# Patient Record
Sex: Female | Born: 1960 | Race: Black or African American | Hispanic: No | Marital: Single | Smoking: Never smoker
Health system: Southern US, Community
[De-identification: ages and names within clinical notes are randomized; demographics above are authoritative.]

## PROBLEM LIST (undated history)

## (undated) DIAGNOSIS — M1990 Unspecified osteoarthritis, unspecified site: Secondary | ICD-10-CM

## (undated) DIAGNOSIS — G8929 Other chronic pain: Secondary | ICD-10-CM

## (undated) DIAGNOSIS — M549 Dorsalgia, unspecified: Secondary | ICD-10-CM

## (undated) DIAGNOSIS — E669 Obesity, unspecified: Secondary | ICD-10-CM

## (undated) DIAGNOSIS — E7800 Pure hypercholesterolemia, unspecified: Secondary | ICD-10-CM

## (undated) DIAGNOSIS — R600 Localized edema: Secondary | ICD-10-CM

## (undated) DIAGNOSIS — G35 Multiple sclerosis: Secondary | ICD-10-CM

## (undated) DIAGNOSIS — E559 Vitamin D deficiency, unspecified: Secondary | ICD-10-CM

## (undated) DIAGNOSIS — I10 Essential (primary) hypertension: Secondary | ICD-10-CM

## (undated) DIAGNOSIS — M2550 Pain in unspecified joint: Secondary | ICD-10-CM

## (undated) DIAGNOSIS — G709 Myoneural disorder, unspecified: Secondary | ICD-10-CM

## (undated) HISTORY — PX: OTHER SURGICAL HISTORY: SHX169

## (undated) HISTORY — PX: TONSILLECTOMY: SUR1361

## (undated) HISTORY — PX: APPENDECTOMY: SHX54

## (undated) HISTORY — DX: Essential (primary) hypertension: I10

## (undated) HISTORY — DX: Pure hypercholesterolemia, unspecified: E78.00

## (undated) HISTORY — DX: Dorsalgia, unspecified: M54.9

## (undated) HISTORY — DX: Localized edema: R60.0

## (undated) HISTORY — DX: Vitamin D deficiency, unspecified: E55.9

## (undated) HISTORY — DX: Other chronic pain: G89.29

## (undated) HISTORY — DX: Pain in unspecified joint: M25.50

## (undated) HISTORY — PX: ROTATOR CUFF REPAIR: SHX139

## (undated) HISTORY — DX: Obesity, unspecified: E66.9

## (undated) HISTORY — PX: HAND SURGERY: SHX662

---

## 2009-10-04 ENCOUNTER — Other Ambulatory Visit: Admission: RE | Admit: 2009-10-04 | Discharge: 2009-10-04 | Payer: Self-pay | Admitting: Obstetrics and Gynecology

## 2013-04-06 DIAGNOSIS — G35 Multiple sclerosis: Secondary | ICD-10-CM | POA: Insufficient documentation

## 2013-12-17 ENCOUNTER — Emergency Department (HOSPITAL_COMMUNITY)
Admission: EM | Admit: 2013-12-17 | Discharge: 2013-12-17 | Disposition: A | Discharge status: Home / Self Care | Payer: BC Managed Care – PPO | Attending: Emergency Medicine | Admitting: Emergency Medicine

## 2013-12-17 ENCOUNTER — Emergency Department (HOSPITAL_COMMUNITY): Payer: BC Managed Care – PPO

## 2013-12-17 ENCOUNTER — Encounter (HOSPITAL_COMMUNITY): Payer: Self-pay | Admitting: Emergency Medicine

## 2013-12-17 DIAGNOSIS — Z8669 Personal history of other diseases of the nervous system and sense organs: Secondary | ICD-10-CM | POA: Insufficient documentation

## 2013-12-17 DIAGNOSIS — Z79899 Other long term (current) drug therapy: Secondary | ICD-10-CM | POA: Insufficient documentation

## 2013-12-17 DIAGNOSIS — M79609 Pain in unspecified limb: Secondary | ICD-10-CM | POA: Insufficient documentation

## 2013-12-17 DIAGNOSIS — R002 Palpitations: Secondary | ICD-10-CM | POA: Insufficient documentation

## 2013-12-17 DIAGNOSIS — Z87891 Personal history of nicotine dependence: Secondary | ICD-10-CM | POA: Insufficient documentation

## 2013-12-17 DIAGNOSIS — R110 Nausea: Secondary | ICD-10-CM | POA: Insufficient documentation

## 2013-12-17 DIAGNOSIS — R079 Chest pain, unspecified: Secondary | ICD-10-CM | POA: Insufficient documentation

## 2013-12-17 HISTORY — DX: Multiple sclerosis: G35

## 2013-12-17 LAB — POCT I-STAT TROPONIN I: Troponin i, poc: 0 ng/mL (ref 0.00–0.08)

## 2013-12-17 LAB — BASIC METABOLIC PANEL
Calcium: 8.9 mg/dL (ref 8.4–10.5)
Chloride: 107 mEq/L (ref 96–112)
GFR calc Af Amer: >90 mL/min (ref >90)
GFR calc non Af Amer: >90 mL/min (ref >90)
Sodium: 140 mEq/L (ref 135–145)

## 2013-12-17 LAB — CBC
MCH: 29.6 pg (ref 26.0–34.0)
MCHC: 33.8 g/dL (ref 30.0–36.0)
Platelets: 194 10*3/uL (ref 150–400)
RBC: 4.73 MIL/uL (ref 3.87–5.11)
RDW: 13.2 % (ref 11.5–15.5)
WBC: 4.7 10*3/uL (ref 4.0–10.5)

## 2013-12-17 IMAGING — CR DG CHEST 2V
2 series · 2 of 2 positions shown · non-contrast
Comparison: None.

CLINICAL DATA: Chest pain

EXAM:
CHEST  2 VIEW

[w chest pa]
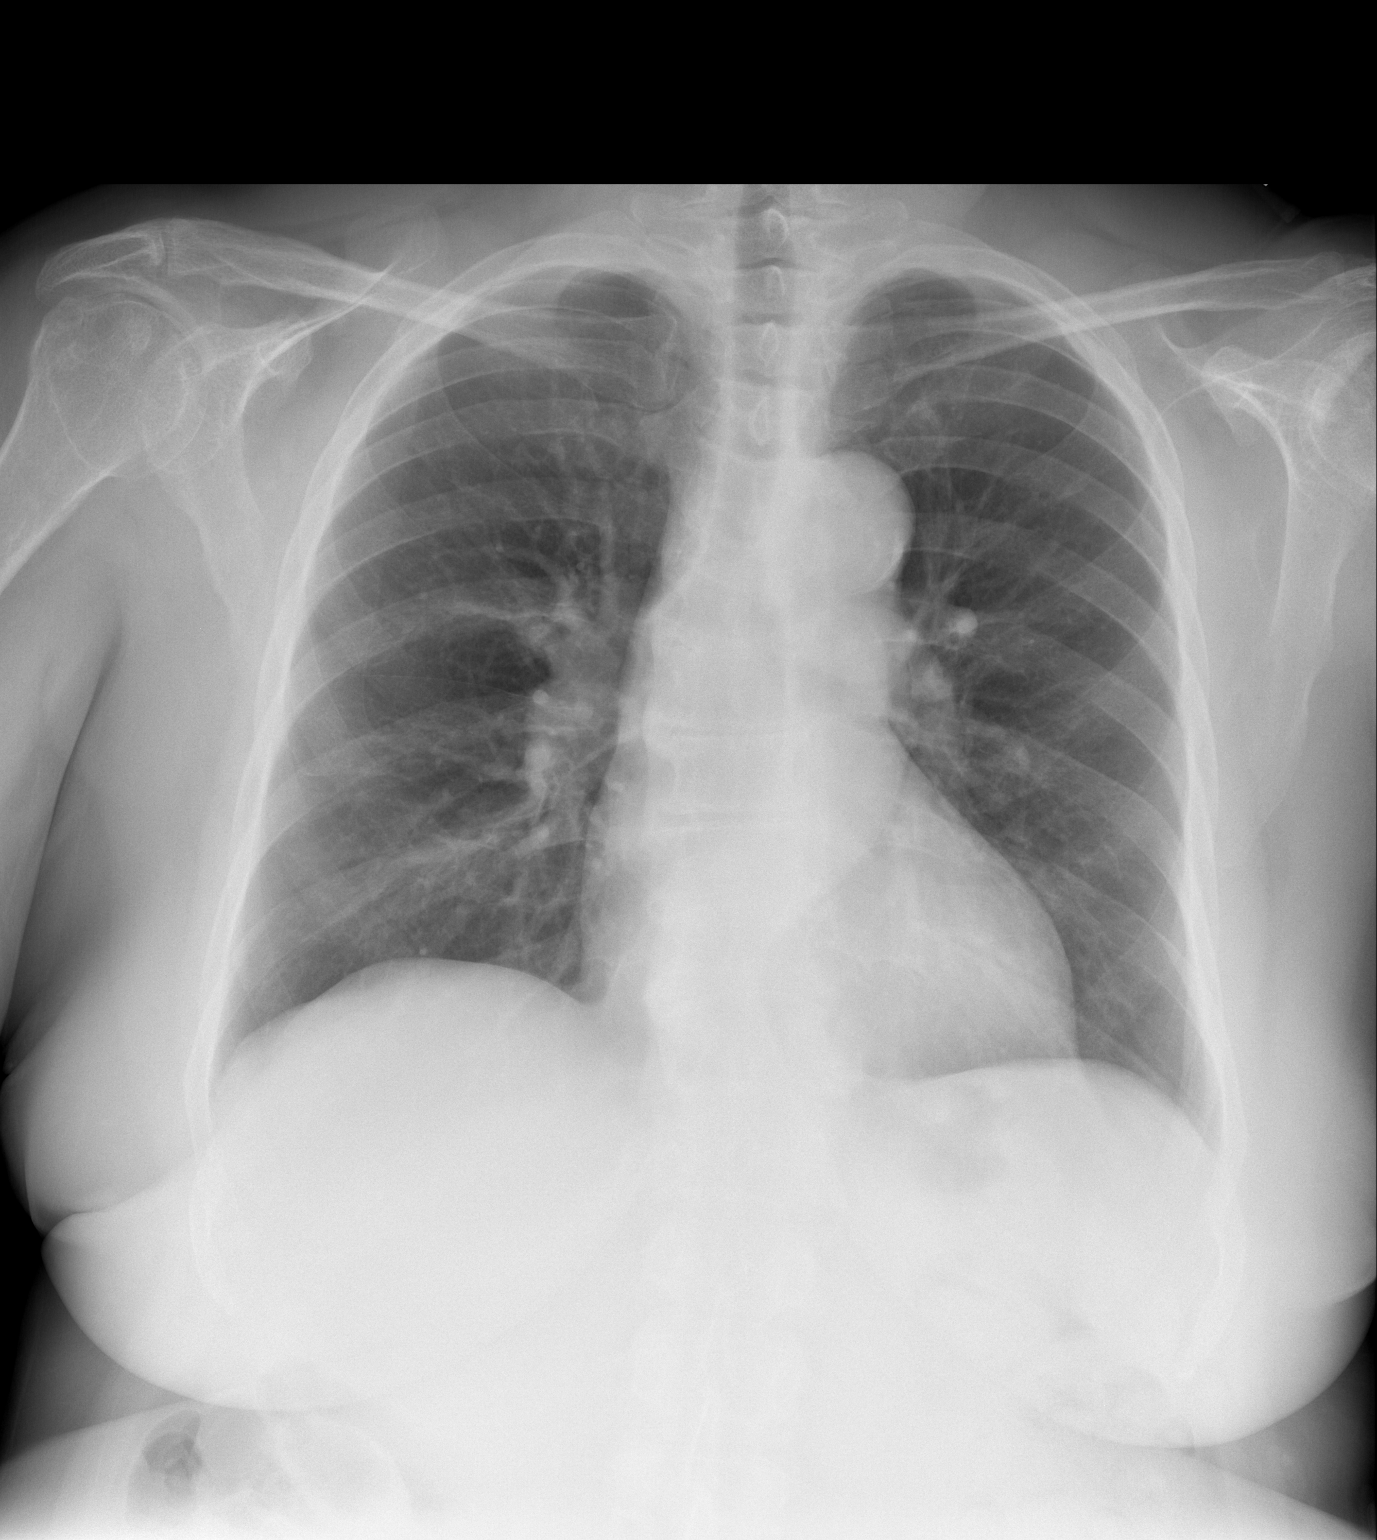

[w chest lat]
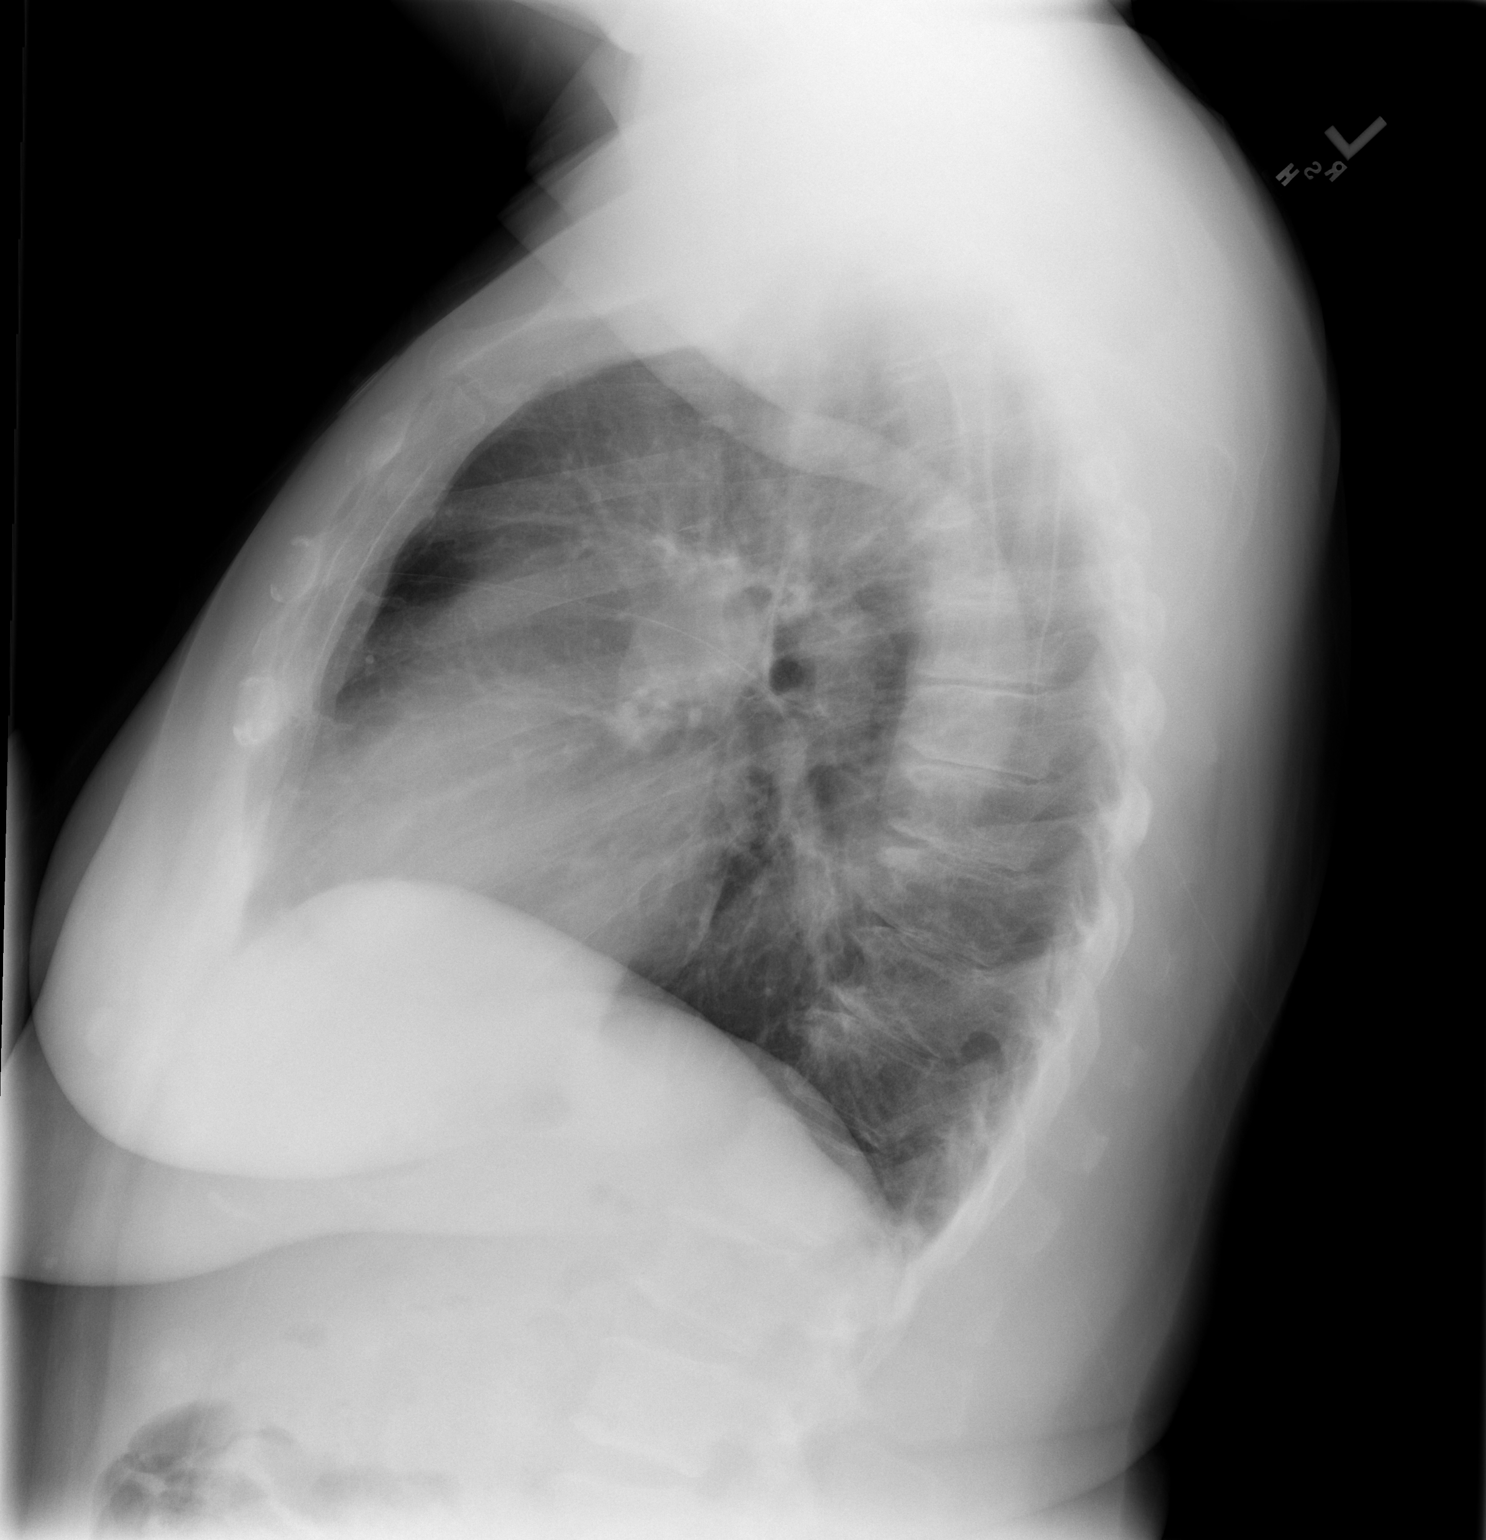

[2 of 2 positions shown; findings below may reference images not displayed]

FINDINGS: Lungs are clear. The heart size and pulmonary vascularity are
normal. No adenopathy. No pneumothorax. Aorta is tortuous with
atherosclerotic change. There is degenerative change in the thoracic
spine.
IMPRESSION: No edema or consolidation. Aorta is tortuous with atherosclerotic
change.

## 2013-12-17 NOTE — ED Provider Notes (Signed)
CSN: [id number]     Arrival date & time 12/17/13  4098 History   First MD Initiated Contact with Patient 12/17/13 1153     Chief Complaint  Patient presents with  . Chest Pain   (Consider location/radiation/quality/duration/timing/severity/associated sxs/prior Treatment) HPI Comments: Patient with history of multiple sclerosis -- presents with complaint of chest pain. Patient awoke today developed mid chest pressure with nausea. Symptoms lasted from about 6 AM until 10:30 AM before resolving spontaneously. Patient describes aching in her left arm and racing heart at times. No diaphoresis. No radiation to jaw or neck. No vomiting, diarrhea, fever. Patient does not have history of high blood pressure, hypercholesterolemia, diabetes. She smoked when she was younger but not for a long period of time. She does have an extensive family history of MI, bypass. Father had a triple bypass. No MI in mother or other first degree relatives. No treatments prior to today. Patient denies previous episodes of chest pain, even with exertion. PCP is in [place]. The onset of this condition was acute. The course is resolved. Aggravating factors: none. Alleviating factors: none.    Patient is a 52 y.o. female presenting with chest pain. The history is provided by the patient.  Chest Pain Associated symptoms: no abdominal pain, no back pain, no cough, no diaphoresis, no fever, no nausea, no palpitations, no shortness of breath and not vomiting     Past Medical History  Diagnosis Date  . Multiple sclerosis    Past Surgical History  Procedure Laterality Date  . Tonsillectomy    . Appendectomy    . Cesarean section     History reviewed. No pertinent family history. History  Substance Use Topics  . Smoking status: Never Smoker   . Smokeless tobacco: Not on file  . Alcohol Use: Yes     Comment: social   OB History   Grav Para Term Preterm Abortions TAB SAB Ect Mult Living                 Review of  Systems  Constitutional: Negative for fever and diaphoresis.  Eyes: Negative for redness.  Respiratory: Negative for cough and shortness of breath.   Cardiovascular: Positive for chest pain. Negative for palpitations and leg swelling.  Gastrointestinal: Negative for nausea, vomiting and abdominal pain.  Genitourinary: Negative for dysuria.  Musculoskeletal: Negative for back pain and neck pain.  Skin: Negative for rash.  Neurological: Negative for syncope and light-headedness.    Allergies  Review of patient's allergies indicates no known allergies.  Home Medications   Current Outpatient Rx  Name  Route  Sig  Dispense  Refill  . calcium-vitamin D (OSCAL WITH D) 500-200 MG-UNIT per tablet   Oral   Take 1 tablet by mouth daily with breakfast.         . Multiple Vitamins-Minerals (MULTIVITAMIN WITH MINERALS) tablet   Oral   Take 1 tablet by mouth daily.         . vitamin B-12 (CYANOCOBALAMIN) 1000 MCG tablet   Oral   Take 1,000 mcg by mouth daily.          BP 123/97  Pulse 83  Temp(Src) 97.9 F (36.6 C) (Oral)  Resp 15  SpO2 100% Physical Exam  Nursing note and vitals reviewed. Constitutional: She appears well-developed and well-nourished.  HENT:  Head: Normocephalic and atraumatic.  Mouth/Throat: Mucous membranes are normal. Mucous membranes are not dry.  Eyes: Conjunctivae are normal.  Neck: Trachea normal and normal range of motion.  Neck supple. Normal carotid pulses and no JVD present. No muscular tenderness present. Carotid bruit is not present. No tracheal deviation present.  Cardiovascular: Normal rate, regular rhythm, S1 normal, S2 normal, normal heart sounds and intact distal pulses.  Exam reveals no decreased pulses.   No murmur heard. Pulmonary/Chest: Effort normal. No respiratory distress. She has no wheezes. She exhibits no tenderness.  Abdominal: Soft. Normal aorta and bowel sounds are normal. There is no tenderness. There is no rebound and no  guarding.  Musculoskeletal: Normal range of motion.  Neurological: She is alert.  Skin: Skin is warm and dry. She is not diaphoretic. No cyanosis. No pallor.  Psychiatric: She has a normal mood and affect.    ED Course  Procedures (including critical care time) Labs Review Labs Reviewed  CBC  BASIC METABOLIC PANEL  POCT I-STAT TROPONIN I  POCT I-STAT TROPONIN I   Imaging Review Dg Chest 2 View  12/17/2013   CLINICAL DATA:  Chest pain  EXAM: CHEST  2 VIEW  COMPARISON:  None.  FINDINGS: Lungs are clear. The heart size and pulmonary vascularity are normal. No adenopathy. No pneumothorax. Aorta is tortuous with atherosclerotic change. There is degenerative change in the thoracic spine.  IMPRESSION: No edema or consolidation. Aorta is tortuous with atherosclerotic change.   Electronically Signed   By: Bretta Bang M.D.   On: 12/17/2013 11:07    EKG Interpretation   None      12:09 PM Patient seen and examined. Work-up initiated. No current chest pain.   Vital signs reviewed and are as follows: Filed Vitals:   12/17/13 1006  BP: 123/97  Pulse: 83  Temp: 97.9 F (36.6 C)  Resp: 15   Pt was discussed with Dr. Anitra Lauth. Agrees check 2nd marker, d/c to home if neg. Pt informed and agrees. Note was made of atherosclerosis on CXR and patient informed that this could indicate atherosclerotic disease elsewhere. She is explained the importance of follow-up.   2nd troponin negative. Patient informed. She will f/u with PCP in [place] and she verbalizes agreement.   Patient was counseled to return with severe chest pain, especially if the pain is crushing or pressure-like and spreads to the arms, back, neck, or jaw, or if they have sweating, nausea, or shortness of breath with the pain. They were encouraged to call 911 with these symptoms.   They were also told to return if their chest pain gets worse and does not go away with rest, they have an attack of chest pain lasting longer than  usual despite rest and treatment with the medications their caregiver has prescribed, if they wake from sleep with chest pain or shortness of breath, if they feel dizzy or faint, if they have chest pain not typical of their usual pain, or if they have any other emergent concerns regarding their health.  The patient verbalized understanding and agreed.     MDM   1. Chest pain    Patient with CP, risk factor of family history only with atypical CP, non-ischemic EKG, negative delta troponin. She has PCP f/u in [place]. Feel she is reliable to f/u with PCP for consideration of stress testing. Low suspicion for ACS today but she will require f/u.     Renne Crigler, PA-C 12/17/13 1625

## 2013-12-17 NOTE — ED Notes (Signed)
Pt states she woke this am with nausea and midsternal chest pressure. When she got up and started getting ready she suddenly felt her pulse racing. States "i could feel it in my throat." states throughout the day her L side of chest and arm continues to "feel sore."

## 2013-12-17 NOTE — ED Provider Notes (Signed)
Medical screening examination/treatment/procedure(s) were performed by non-physician practitioner and as supervising physician I was immediately available for consultation/collaboration.  EKG Interpretation    Date/Time:  Friday December 17 2013 09:53:22 EST Ventricular Rate:  77 PR Interval:  186 QRS Duration: 104 QT Interval:  362 QTC Calculation: 409 R Axis:   -63 Text Interpretation:  Normal sinus rhythm Low voltage QRS Incomplete right bundle branch block Left anterior fascicular block Possible Lateral infarct , age undetermined Cannot rule out Inferior infarct , age undetermined No previous tracing Confirmed by Anitra Lauth  MD, Andria Head ([id number]) on 12/17/2013 1:03:27 PM              Gwyneth Sprout, MD 12/17/13 [phone]

## 2016-02-16 DIAGNOSIS — I10 Essential (primary) hypertension: Secondary | ICD-10-CM | POA: Insufficient documentation

## 2016-04-01 DIAGNOSIS — R260 Ataxic gait: Secondary | ICD-10-CM | POA: Insufficient documentation

## 2018-10-14 DIAGNOSIS — M170 Bilateral primary osteoarthritis of knee: Secondary | ICD-10-CM | POA: Insufficient documentation

## 2019-02-22 DIAGNOSIS — E7849 Other hyperlipidemia: Secondary | ICD-10-CM | POA: Insufficient documentation

## 2019-02-25 ENCOUNTER — Ambulatory Visit (HOSPITAL_COMMUNITY)
Admission: EM | Admit: 2019-02-25 | Discharge: 2019-02-25 | Disposition: A | Discharge status: Home / Self Care | Payer: 59 | Attending: Emergency Medicine | Admitting: Emergency Medicine

## 2019-02-25 ENCOUNTER — Encounter (HOSPITAL_COMMUNITY): Payer: Self-pay | Admitting: Emergency Medicine

## 2019-02-25 ENCOUNTER — Other Ambulatory Visit: Payer: Self-pay

## 2019-02-25 DIAGNOSIS — J069 Acute upper respiratory infection, unspecified: Secondary | ICD-10-CM | POA: Diagnosis not present

## 2019-02-25 MED ORDER — IBUPROFEN 600 MG PO TABS: 600.0000 mg | ORAL_TABLET | Freq: Four times a day (QID) | ORAL | 0 refills | Status: DC | PRN

## 2019-02-25 MED ORDER — FLUTICASONE PROPIONATE 50 MCG/ACT NA SUSP: 2.0000 | Freq: Every day | NASAL | 0 refills | Status: DC

## 2019-02-25 MED ORDER — ONDANSETRON 8 MG PO TBDP: ORAL_TABLET | ORAL | 0 refills | Status: DC

## 2019-02-25 NOTE — ED Provider Notes (Signed)
HPI  SUBJECTIVE:  [name] is a 58 y.o. female who presents with 4 days of clear nasal congestion,, rhinorrhea, fatigue, nausea, cough productive of clear, yellow phlegm, wheezing, shortness of breath, dyspnea on exertion.  Reports decreased appetite, no vomiting.  No abdominal pain, diarrhea.  No fevers, body aches.  She reports a headache on the first day of illness secondary to a cough, but this has resolved.  No allergy symptoms.  No antipyretic in the past 4 to 6 hours.  No antibiotics in the past month.  She has tried Coricidin high blood pressure with improvement in the cough, nasal congestion, rhinorrhea, Tylenol for the headache with improvement.  Symptoms are worse with getting up and exerting herself.  She got a flu shot this year.  States that the cough is well controlled with Coricidin.  She has a past medical history of MS, hypertension.  No history of diabetes, asthma, emphysema, COPD, smoking, kidney disease.  She is not on any DMARDs.  YHC:WCBJ, Aloha Gell, MD  Past Medical History:  Diagnosis Date  . Multiple sclerosis (HCC)     Past Surgical History:  Procedure Laterality Date  . APPENDECTOMY    . CESAREAN SECTION    . TONSILLECTOMY      No family history on file.  Social History   Tobacco Use  . Smoking status: Never Smoker  Substance Use Topics  . Alcohol use: Yes    Comment: social  . Drug use: No    No current facility-administered medications for this encounter.   Current Outpatient Medications:  .  hydrochlorothiazide (HYDRODIURIL) 25 MG tablet, Take 25 mg by mouth daily., Disp: , Rfl:  .  losartan (COZAAR) 50 MG tablet, Take 50 mg by mouth daily., Disp: , Rfl:  .  Multiple Vitamins-Minerals (MULTIVITAMIN WITH MINERALS) tablet, Take 1 tablet by mouth daily., Disp: , Rfl:  .  Teriflunomide (AUBAGIO) 14 MG TABS, Take by mouth., Disp: , Rfl:  .  calcium-vitamin D (OSCAL WITH D) 500-200 MG-UNIT per tablet, Take 1 tablet by mouth daily with breakfast., Disp:  , Rfl:  .  fluticasone (FLONASE) 50 MCG/ACT nasal spray, Place 2 sprays into both nostrils daily., Disp: 16 g, Rfl: 0 .  ibuprofen (ADVIL,MOTRIN) 600 MG tablet, Take 1 tablet (600 mg total) by mouth every 6 (six) hours as needed., Disp: 30 tablet, Rfl: 0 .  ondansetron (ZOFRAN ODT) 8 MG disintegrating tablet, 1/2- 1 tablet q 8 hr prn nausea, vomiting, Disp: 20 tablet, Rfl: 0 .  vitamin B-12 (CYANOCOBALAMIN) 1000 MCG tablet, Take 1,000 mcg by mouth daily., Disp: , Rfl:   No Known Allergies   ROS  As noted in HPI.   Physical Exam  BP 140/86   Pulse 83   Temp 99.1 F (37.3 C) (Oral)   Resp 16   SpO2 99%   Constitutional: Well developed, well nourished, no acute distress Eyes:  EOMI, conjunctiva normal bilaterally HENT: Normocephalic, atraumatic,mucus membranes moist.  Positive mild nasal congestion.  Normal turbinates.  No maxillary or frontal sinus tenderness.  Tonsils surgically absent.  Positive cobblestoning and postnasal drip. Neck: No cervical lymphadenopathy Respiratory: Normal inspiratory effort lungs clear bilaterally, good air movement Cardiovascular: Normal rate, regular rhythm, no murmurs rubs or gallops GI: nondistended skin: No rash, skin intact Musculoskeletal: no deformities Neurologic: Alert & oriented x 3, no focal neuro deficits Psychiatric: Speech and behavior appropriate   ED Course   Medications - No data to display  No orders of the defined types  were placed in this encounter.   No results found for this or any previous visit (from the past 24 hour(s)). No results found.  ED Clinical Impression  Viral upper respiratory tract infection   ED Assessment/Plan  appears to be a URI/viral syndrome.  No clear indications for antibiotics at this time.  Home with Flonase, ibuprofen 600 mg combined with 1 g of Tylenol 3-4 times a day as needed for headache, Zofran in case the nausea becomes more intense.  Work note for today and tomorrow.  Follow-up with  her primary care physician or may return here if not better in 6 days or if she starts having fevers above 100.4 and we can reevaluate the need for antibiotics at that time.    Discussed  MDM, treatment plan, and plan for follow-up with patient.. patient agrees with plan.   Meds ordered this encounter  Medications  . fluticasone (FLONASE) 50 MCG/ACT nasal spray    Sig: Place 2 sprays into both nostrils daily.    Dispense:  16 g    Refill:  0  . ibuprofen (ADVIL,MOTRIN) 600 MG tablet    Sig: Take 1 tablet (600 mg total) by mouth every 6 (six) hours as needed.    Dispense:  30 tablet    Refill:  0  . ondansetron (ZOFRAN ODT) 8 MG disintegrating tablet    Sig: 1/2- 1 tablet q 8 hr prn nausea, vomiting    Dispense:  20 tablet    Refill:  0    *This clinic note was created using Scientist, clinical (histocompatibility and immunogenetics). Therefore, there may be occasional mistakes despite careful proofreading.   ?   Domenick Gong, MD 02/26/19 [phone]

## 2019-02-25 NOTE — Discharge Instructions (Addendum)
Flonase, ibuprofen 600 mg combined with 1 g of Tylenol 3-4 times a day as needed for headache, Zofran in case the nausea becomes more intense.   Follow-up with your primary care physician or may return here if not better in 6 days or if you start having fevers above 100.4.  You can try some vitamin C as well.

## 2019-02-25 NOTE — ED Triage Notes (Signed)
PT reports she had a PCP visit Monday for a physical. PT reports cough, congestion, fatigue since her PCP visit.   Denies fever and bodyaches.

## 2020-01-16 ENCOUNTER — Encounter (INDEPENDENT_AMBULATORY_CARE_PROVIDER_SITE_OTHER): Payer: Self-pay | Admitting: Bariatrics

## 2020-01-17 ENCOUNTER — Encounter (INDEPENDENT_AMBULATORY_CARE_PROVIDER_SITE_OTHER): Payer: Self-pay | Admitting: Bariatrics

## 2020-01-17 ENCOUNTER — Other Ambulatory Visit: Payer: Self-pay

## 2020-01-17 ENCOUNTER — Ambulatory Visit (INDEPENDENT_AMBULATORY_CARE_PROVIDER_SITE_OTHER): Payer: 59 | Admitting: Bariatrics

## 2020-01-17 VITALS — BP 148/83 | HR 82 | Temp 97.8°F | Ht 62.0 in | Wt 307.0 lb

## 2020-01-17 DIAGNOSIS — Z1331 Encounter for screening for depression: Secondary | ICD-10-CM | POA: Diagnosis not present

## 2020-01-17 DIAGNOSIS — Z9189 Other specified personal risk factors, not elsewhere classified: Secondary | ICD-10-CM

## 2020-01-17 DIAGNOSIS — G8929 Other chronic pain: Secondary | ICD-10-CM

## 2020-01-17 DIAGNOSIS — R5383 Other fatigue: Secondary | ICD-10-CM

## 2020-01-17 DIAGNOSIS — G35D Multiple sclerosis, unspecified: Secondary | ICD-10-CM

## 2020-01-17 DIAGNOSIS — I10 Essential (primary) hypertension: Secondary | ICD-10-CM | POA: Diagnosis not present

## 2020-01-17 DIAGNOSIS — E559 Vitamin D deficiency, unspecified: Secondary | ICD-10-CM

## 2020-01-17 DIAGNOSIS — E538 Deficiency of other specified B group vitamins: Secondary | ICD-10-CM

## 2020-01-17 DIAGNOSIS — R0602 Shortness of breath: Secondary | ICD-10-CM

## 2020-01-17 DIAGNOSIS — M25561 Pain in right knee: Secondary | ICD-10-CM

## 2020-01-17 DIAGNOSIS — E7800 Pure hypercholesterolemia, unspecified: Secondary | ICD-10-CM

## 2020-01-17 DIAGNOSIS — G35 Multiple sclerosis: Secondary | ICD-10-CM | POA: Diagnosis not present

## 2020-01-17 DIAGNOSIS — M25562 Pain in left knee: Secondary | ICD-10-CM

## 2020-01-17 DIAGNOSIS — Z0289 Encounter for other administrative examinations: Secondary | ICD-10-CM

## 2020-01-17 DIAGNOSIS — Z6841 Body Mass Index (BMI) 40.0 and over, adult: Secondary | ICD-10-CM

## 2020-01-17 NOTE — Progress Notes (Signed)
Dear Dr. Melrose [name],   Thank you for referring [name] to our clinic. The following note includes my evaluation and treatment recommendations.  Chief Complaint:   OBESITY [name] (MR# [id number]) is a 59 y.o. female who presents for evaluation and treatment of obesity and related comorbidities. Current BMI is Body mass index is 56.15 kg/m.Marland Kitchen [name] has been struggling with her weight for many years and has been unsuccessful in either losing weight, maintaining weight loss, or reaching her healthy weight goal.  [name] is currently in the action stage of change and ready to dedicate time achieving and maintaining a healthier weight. [name] is interested in becoming our patient and working on intensive lifestyle modifications including (but not limited to) diet and exercise for weight loss.   [name] does not like to cook, but she does as needed. She craves chips and popcorn. She dislikes tofu. She does snack at night.  [name]'s habits were reviewed today and are as follows: she thinks her family will eat healthier with her, her desired weight loss is 107 lbs, she has been heavy most of her life, she started gaining weight in her late 20's, her heaviest weight ever was 307 pounds, she craves chips and popcorn, she snacks frequently in the evenings, she skips breakfast 4 times per week, she frequently makes poor food choices, she frequently eats larger portions than normal, she has binge eating behaviors and she struggles with emotional eating.  Depression Screen [name]'s Food and Mood (modified PHQ-9) score was 19.  Depression screen PHQ 2/9 01/17/2020  Decreased Interest 3  Down, Depressed, Hopeless 3  PHQ - 2 Score 6  Altered sleeping 2  Tired, decreased energy 3  Change in appetite 2  Feeling bad or failure about yourself  2  Trouble concentrating 1  Moving slowly or fidgety/restless 3  Suicidal thoughts 0  PHQ-9 Score 19  Difficult doing  work/chores Somewhat difficult   Subjective:   Other fatigue. [name] denies daytime somnolence and admits to waking up still tired. [name] generally gets 6 hours of sleep per night, and states that she generally has restful sleep. Snoring is present. Apneic episodes are not present. Epworth Sleepiness Score is 9.  SOB (shortness of breath) on exertion. [name] notes increasing shortness of breath with certain activities and seems to be worsening over time with weight gain. She notes getting out of breath sooner with activity than she used to. This has gotten worse recently. [name] denies shortness of breath at rest or orthopnea.   Essential hypertension. This is not at goal. [name] is taking Cozaar and HCTZ (PCP recently increased her Cozaar dose).  BP Readings from Last 3 Encounters:  01/17/20 (!) 148/83  02/25/19 140/86  12/17/13 120/81   Lab Results  Component Value Date   CREATININE 0.74 12/17/2013   Multiple sclerosis ([place]). [name] is taking Aubagio. This is stable. She is followed at [facility] by Dr. Donato Heinz.  Chronic pain of both knees. [name] is seeing an orthopedist and needs right knee replacement.  Hypercholesteremia. This is not at goal. She is on no medications.  Vitamin D deficiency. [name] is taking OTC Vitamin D.  B12 deficiency. She is not taking OTC B12. She denies paresthesias related to MS.  No results found for: VITAMINB12  Depression screening. [name] had a depression screening today.  At risk for heart disease. [name] is at a higher than average risk for cardiovascular disease due to obesity. Reviewed: no chest pain on exertion,  no dyspnea on exertion, and no swelling of ankles.  Assessment/Plan:   Other fatigue. [name] does feel that her weight is causing her energy to be lower than it should be. Fatigue may be related to obesity, depression or many other causes. Labs will be ordered, and in the meanwhile, [name] will  focus on self care including making healthy food choices, increasing physical activity and focusing on stress reduction. EKG 12-Lead, Hemoglobin A1c, Lipid Panel With LDL/HDL Ratio, VITAMIN D 25 Hydroxy (Vit-D Deficiency, Fractures), Vitamin B12, T3, T4, free, TSH ordered.  SOB (shortness of breath) on exertion. [name] does feel that she gets out of breath more easily that she used to when she exercises. [name]'s shortness of breath appears to be obesity related and exercise induced. She has agreed to work on weight loss and gradually increase exercise to treat her exercise induced shortness of breath. Will continue to monitor closely. Hemoglobin A1c, Lipid Panel With LDL/HDL Ratio, VITAMIN D 25 Hydroxy (Vit-D Deficiency, Fractures), Vitamin B12, T3, T4, free, TSH ordered.  Essential hypertension. [name] is working on healthy weight loss and exercise to improve blood pressure control. We will watch for signs of hypotension as she continues her lifestyle modifications. She will continue her medications as prescribed and will eliminate salt from her diet. Comprehensive metabolic panel, Hemoglobin A1c, Insulin, random, Lipid Panel With LDL/HDL Ratio, VITAMIN D 25 Hydroxy (Vit-D Deficiency, Fractures), Vitamin B12 ordered.  Multiple sclerosis (HCC). [name] will follow-up with her neurologist and continue her medications as prescribed.  Chronic pain of both knees. [name] will follow-up with her orthopedist. She will decrease her weight prior to surgery.  Hypercholesteremia. [name] will work on diet and exercise. Comprehensive metabolic panel ordered.  Vitamin D deficiency. Low Vitamin D level contributes to fatigue and are associated with obesity, breast, and colon cancer. She will have routine testing of Vitamin D and follow-up in 2 weeks.  B12 deficiency. The diagnosis was reviewed with the patient. Counseling provided today, see below. We will continue to monitor. Orders and follow up  as documented in patient record. B12 level ordered.  Counseling . The body needs vitamin B12: to make red blood cells; to make DNA; and to help the nerves work properly so they can carry messages from the brain to the body.  . The main causes of vitamin B12 deficiency include dietary deficiency, digestive diseases, pernicious anemia, and having a surgery in which part of the stomach or small intestine is removed.  . Certain medicines can make it harder for the body to absorb vitamin B12. These medicines include: heartburn medications; some antibiotics; some medications used to treat diabetes, gout, and high cholesterol.  . In some cases, there are no symptoms of this condition. If the condition leads to anemia or nerve damage, various symptoms can occur, such as weakness or fatigue, shortness of breath, and numbness or tingling in your hands and feet.   . Treatment:  o May include taking vitamin B12 supplements.  o Avoid alcohol.  o Eat lots of healthy foods that contain vitamin B12: - Beef, pork, chicken, [place], and organ meats, such as liver.  - Seafood: This includes clams, rainbow trout, salmon, tuna, and haddock. Eggs.  - Cereal and dairy products that are fortified: This means that vitamin B12 has been added to the food.   Depression screening. [name] had a positive depression screening. Depression is commonly associated with obesity and often results in emotional eating behaviors. We will monitor this closely and work on CBT  to help improve the non-hunger eating patterns. Referral to Psychology may be required if no improvement is seen as she continues in our clinic.  At risk for heart disease. [name] was given approximately 15 minutes of coronary artery disease prevention counseling today. She is 59 y.o. female and has risk factors for heart disease including obesity. We discussed intensive lifestyle modifications today with an emphasis on specific weight loss instructions and  strategies.   Repetitive spaced learning was employed today to elicit superior memory formation and behavioral change.  Class 3 severe obesity with serious comorbidity and body mass index (BMI) of 50.0 to 59.9 in adult, unspecified obesity type (HCC).  [name] is currently in the action stage of change and her goal is to continue with weight loss efforts. I recommend [name] begin the structured treatment plan as follows:  She has agreed to the Category 3 Plan.  We reviewed labs from 02/17/2019 including CBC, CMP, glucose, thyroid, and old EKG studies.  She will work on meal planning.  Exercise goals: For substantial health benefits, adults should do at least 150 minutes (2 hours and 30 minutes) a week of moderate-intensity, or 75 minutes (1 hour and 15 minutes) a week of vigorous-intensity aerobic physical activity, or an equivalent combination of moderate- and vigorous-intensity aerobic activity. Aerobic activity should be performed in episodes of at least 10 minutes, and preferably, it should be spread throughout the week. Adults should also include muscle-strengthening activities that involve all major muscle groups on 2 or more days a week.   Behavioral modification strategies: increasing lean protein intake, decreasing simple carbohydrates, increasing vegetables, increasing water intake, decreasing eating out, no skipping meals, meal planning and cooking strategies and keeping healthy foods in the home.  She was informed of the importance of frequent follow-up visits to maximize her success with intensive lifestyle modifications for her multiple health conditions. She was informed we would discuss her lab results at her next visit unless there is a critical issue that needs to be addressed sooner. Remedy agreed to keep her next visit at the agreed upon time to discuss these results.  Objective:   Blood pressure (!) 148/83, pulse 82, temperature 97.8 F (36.6 C), height 5\' 2"   (1.575 m), weight (!) 307 lb (139.3 kg), SpO2 90 %. Body mass index is 56.15 kg/m.  EKG: Sinus  Rhythm with a rate of 74 BPM. Low voltage in precordial leads. 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 Possible old anterior-lateral and inferior infarct ( essentially the same from EKG from 2014. Otherwise normal.   Indirect Calorimeter completed today shows a VO2 of 263 and a REE of 1831. There is no BMR on chart.  General: Cooperative, alert, well developed, in no acute distress. HEENT: Conjunctivae and lids unremarkable. Cardiovascular: Regular rhythm.  Lungs: Normal work of breathing. Neurologic: No focal deficits. She walks with a cane for ambulation.  Lab Results  Component Value Date   CREATININE 0.74 12/17/2013   BUN 21 12/17/2013   NA 140 12/17/2013   K 4.1 12/17/2013   CL 107 12/17/2013   CO2 21 12/17/2013   No results found for: ALT, AST, GGT, ALKPHOS, BILITOT No results found for: HGBA1C No results found for: INSULIN No results found for: TSH No results found for: CHOL, HDL, LDLCALC, LDLDIRECT, TRIG, CHOLHDL Lab Results  Component Value Date   WBC 4.7 12/17/2013   HGB 14.0 12/17/2013   HCT 41.4 12/17/2013   MCV 87.5 12/17/2013   PLT  194 12/17/2013    Attestation Statements:   Reviewed by clinician on day of visit: allergies, medications, problem list, medical history, surgical history, family history, social history, and previous encounter notes.  Fernanda Drum, am acting as Energy manager for [employer], DO   I have reviewed the above documentation for accuracy and completeness, and I agree with the above. Corinna Capra, DO

## 2020-01-18 ENCOUNTER — Encounter (INDEPENDENT_AMBULATORY_CARE_PROVIDER_SITE_OTHER): Payer: Self-pay | Admitting: Bariatrics

## 2020-01-18 DIAGNOSIS — E559 Vitamin D deficiency, unspecified: Secondary | ICD-10-CM | POA: Insufficient documentation

## 2020-01-18 LAB — COMPREHENSIVE METABOLIC PANEL
ALT: 16 IU/L (ref 0–32)
AST: 22 IU/L (ref 0–40)
Albumin/Globulin Ratio: 1.9 (ref 1.2–2.2)
Albumin: 4.1 g/dL (ref 3.8–4.9)
Alkaline Phosphatase: 68 IU/L (ref 39–117)
BUN/Creatinine Ratio: 26 (ref 12–28)
BUN: 19 mg/dL (ref 8–27)
Bilirubin Total: 0.4 mg/dL (ref 0.0–1.2)
CO2: 24 mmol/L (ref 20–29)
Calcium: 8.9 mg/dL (ref 8.7–10.3)
Chloride: 102 mmol/L (ref 96–106)
Creatinine, Ser: 0.73 mg/dL (ref 0.57–1.00)
GFR calc Af Amer: 104 mL/min/{1.73_m2} (ref >59)
GFR calc non Af Amer: 90 mL/min/{1.73_m2} (ref >59)
Globulin, Total: 2.2 g/dL (ref 1.5–4.5)
Glucose: 80 mg/dL (ref 65–99)
Potassium: 3.9 mmol/L (ref 3.5–5.2)
Sodium: 140 mmol/L (ref 134–144)
Total Protein: 6.3 g/dL (ref 6.0–8.5)

## 2020-01-18 LAB — LIPID PANEL WITH LDL/HDL RATIO
Cholesterol, Total: 239 mg/dL — ABNORMAL HIGH (ref 100–199)
HDL: 51 mg/dL (ref >39)
LDL Chol Calc (NIH): 167 mg/dL — ABNORMAL HIGH (ref 0–99)
LDL/HDL Ratio: 3.3 ratio — ABNORMAL HIGH (ref 0.0–3.2)
Triglycerides: 119 mg/dL (ref 0–149)
VLDL Cholesterol Cal: 21 mg/dL (ref 5–40)

## 2020-01-18 LAB — HEMOGLOBIN A1C
Est. average glucose Bld gHb Est-mCnc: 111 mg/dL
Hgb A1c MFr Bld: 5.5 % (ref 4.8–5.6)

## 2020-01-18 LAB — VITAMIN D 25 HYDROXY (VIT D DEFICIENCY, FRACTURES): Vit D, 25-Hydroxy: 29.5 ng/mL — ABNORMAL LOW (ref 30.0–100.0)

## 2020-01-18 LAB — T3: T3, Total: 108 ng/dL (ref 71–180)

## 2020-01-18 LAB — TSH: TSH: 1.36 u[IU]/mL (ref 0.450–4.500)

## 2020-01-18 LAB — INSULIN, RANDOM: INSULIN: 9.3 u[IU]/mL (ref 2.6–24.9)

## 2020-01-18 LAB — VITAMIN B12: Vitamin B-12: 418 pg/mL (ref 232–1245)

## 2020-01-18 LAB — T4, FREE: Free T4: 1.26 ng/dL (ref 0.82–1.77)

## 2020-01-19 ENCOUNTER — Encounter (INDEPENDENT_AMBULATORY_CARE_PROVIDER_SITE_OTHER): Payer: Self-pay | Admitting: Bariatrics

## 2020-01-19 DIAGNOSIS — Z6841 Body Mass Index (BMI) 40.0 and over, adult: Secondary | ICD-10-CM | POA: Insufficient documentation

## 2020-01-31 ENCOUNTER — Ambulatory Visit (INDEPENDENT_AMBULATORY_CARE_PROVIDER_SITE_OTHER): Payer: 59 | Admitting: Bariatrics

## 2020-01-31 ENCOUNTER — Encounter (INDEPENDENT_AMBULATORY_CARE_PROVIDER_SITE_OTHER): Payer: Self-pay | Admitting: Bariatrics

## 2020-01-31 ENCOUNTER — Other Ambulatory Visit: Payer: Self-pay

## 2020-01-31 VITALS — BP 129/82 | HR 83 | Temp 98.4°F | Ht 62.0 in | Wt 298.0 lb

## 2020-01-31 DIAGNOSIS — I10 Essential (primary) hypertension: Secondary | ICD-10-CM | POA: Diagnosis not present

## 2020-01-31 DIAGNOSIS — Z9189 Other specified personal risk factors, not elsewhere classified: Secondary | ICD-10-CM

## 2020-01-31 DIAGNOSIS — E7800 Pure hypercholesterolemia, unspecified: Secondary | ICD-10-CM | POA: Diagnosis not present

## 2020-01-31 DIAGNOSIS — Z6841 Body Mass Index (BMI) 40.0 and over, adult: Secondary | ICD-10-CM

## 2020-01-31 DIAGNOSIS — E559 Vitamin D deficiency, unspecified: Secondary | ICD-10-CM

## 2020-01-31 MED ORDER — VITAMIN D (ERGOCALCIFEROL) 1.25 MG (50000 UNIT) PO CAPS: 50000.0000 [IU] | ORAL_CAPSULE | ORAL | 0 refills | Status: DC

## 2020-01-31 NOTE — Progress Notes (Signed)
The 10-year ASCVD risk score Denman George DC Montez Hageman., et al., 2013) is: 5.4%   Values used to calculate the score:     Age: 59 years     Sex: Female     Is Non-Hispanic African American: No     Diabetic: No     Tobacco smoker: No     Systolic Blood Pressure: 129 mmHg     Is BP treated: Yes     HDL Cholesterol: 51 mg/dL     Total Cholesterol: 239 mg/dL

## 2020-02-01 ENCOUNTER — Encounter (INDEPENDENT_AMBULATORY_CARE_PROVIDER_SITE_OTHER): Payer: Self-pay | Admitting: Bariatrics

## 2020-02-01 NOTE — Progress Notes (Signed)
Chief Complaint:   OBESITY [name] is here to discuss her progress with her obesity treatment plan along with follow-up of her obesity related diagnoses. [name] is on the Category 3 Plan and states she is following her eating plan approximately 70% of the time. [name] states she is exercising 0 minutes 0 times per week.  Today's visit was #: 2 Starting weight: 307 lbs Starting date: 01/17/2020 Today's weight: 298 lbs Today's date: 01/31/2020 Total lbs lost to date: 9 Total lbs lost since last in-office visit: 9  Interim History: [name] is down 9 lbs. She states that it took about 3 days to get into the groove.  Subjective:   Vitamin D deficiency. [name] is taking a multivitamin and OTC Vitamin D. Last Vitamin D level was 29.5 on 01/17/2020.  Hypercholesteremia. [name] is not currently on a statin.  Lab Results  Component Value Date   CHOL 239 (H) 01/17/2020   HDL 51 01/17/2020   LDLCALC 167 (H) 01/17/2020   TRIG 119 01/17/2020   Lab Results  Component Value Date   ALT 16 01/17/2020   AST 22 01/17/2020   ALKPHOS 68 01/17/2020   BILITOT 0.4 01/17/2020   The 10-year ASCVD risk score Denman George DC Jr., et al., 2013) is: 5.4%   Values used to calculate the score:     Age: 1 years     Sex: Female     Is Non-Hispanic African American: No     Diabetic: No     Tobacco smoker: No     Systolic Blood Pressure: 129 mmHg     Is BP treated: Yes     HDL Cholesterol: 51 mg/dL     Total Cholesterol: 239 mg/dL  Essential hypertension. [name]'s medications have been increased per her PCP. Blood pressure today is 129/82.  BP Readings from Last 3 Encounters:  01/31/20 129/82  01/17/20 (!) 148/83  02/25/19 140/86   Lab Results  Component Value Date   CREATININE 0.73 01/17/2020   CREATININE 0.74 12/17/2013   At risk for osteoporosis. [name] is at higher risk of osteopenia and osteoporosis due to Vitamin D deficiency.   Assessment/Plan:   Vitamin  D deficiency. Low Vitamin D level contributes to fatigue and are associated with obesity, breast, and colon cancer. She was given a prescription for Vitamin D, Ergocalciferol, (DRISDOL) 1.25 MG (50000 UNIT) CAPS capsule every week #4 with 0 refills and will follow-up for routine testing of Vitamin D, at least 2-3 times per year to avoid over-replacement.    Hypercholesteremia. Dymond will decrease trans and saturated fats. She will increase PUFA's and MUFA's.  Essential hypertension. [name] is working on healthy weight loss and exercise to improve blood pressure control. We will watch for signs of hypotension as she continues her lifestyle modifications. She will continue her medications as prescribed.  At risk for osteoporosis. [name] was given approximately 15 minutes of osteoporosis prevention counseling today. [name] is at risk for osteopenia and osteoporosis due to her Vitamin D deficiency. She was encouraged to take her Vitamin D and follow her higher calcium diet and increase strengthening exercise to help strengthen her bones and decrease her risk of osteopenia and osteoporosis.  Repetitive spaced learning was employed today to elicit superior memory formation and behavioral change.  Class 3 severe obesity with serious comorbidity and body mass index (BMI) of 50.0 to 59.9 in adult, unspecified obesity type (HCC).  [name] is currently in the action stage of change. As such, her goal  is to continue with weight loss efforts. She has agreed to the Category 3 Plan.   She will work on meal planning. Options were given for certain items.  We independently reviewed with the patient her labs from 01/17/2020 including CMP, lipids, Vitamin D, A1c, insulin, and thyroid panel.  Exercise goals: All adults should avoid inactivity. Some physical activity is better than none, and adults who participate in any amount of physical activity gain some health benefits.  Behavioral modification  strategies: increasing lean protein intake, decreasing simple carbohydrates, increasing vegetables, increasing water intake, decreasing eating out, no skipping meals, meal planning and cooking strategies, keeping healthy foods in the home and planning for success.  [name] has agreed to follow-up with our clinic in 2 weeks. She was informed of the importance of frequent follow-up visits to maximize her success with intensive lifestyle modifications for her multiple health conditions.   Objective:   Blood pressure 129/82, pulse 83, temperature 98.4 F (36.9 C), height 5\' 2"  (1.575 m), weight 298 lb (135.2 kg), SpO2 90 %. Body mass index is 54.5 kg/m.  General: Cooperative, alert, well developed, in no acute distress. HEENT: Conjunctivae and lids unremarkable. Cardiovascular: Regular rhythm.  Lungs: Normal work of breathing. Neurologic: No focal deficits. Pt is using a cane for ambulation.  Lab Results  Component Value Date   CREATININE 0.73 01/17/2020   BUN 19 01/17/2020   NA 140 01/17/2020   K 3.9 01/17/2020   CL 102 01/17/2020   CO2 24 01/17/2020   Lab Results  Component Value Date   ALT 16 01/17/2020   AST 22 01/17/2020   ALKPHOS 68 01/17/2020   BILITOT 0.4 01/17/2020   Lab Results  Component Value Date   HGBA1C 5.5 01/17/2020   Lab Results  Component Value Date   INSULIN 9.3 01/17/2020   Lab Results  Component Value Date   TSH 1.360 01/17/2020   Lab Results  Component Value Date   CHOL 239 (H) 01/17/2020   HDL 51 01/17/2020   LDLCALC 167 (H) 01/17/2020   TRIG 119 01/17/2020   Lab Results  Component Value Date   WBC 4.7 12/17/2013   HGB 14.0 12/17/2013   HCT 41.4 12/17/2013   MCV 87.5 12/17/2013   PLT 194 12/17/2013   No results found for: IRON, TIBC, FERRITIN  Attestation Statements:   Reviewed by clinician on day of visit: allergies, medications, problem list, medical history, surgical history, family history, social history, and previous  encounter notes.  Migdalia Dk, am acting as Location manager for [employer], DO   I have reviewed the above documentation for accuracy and completeness, and I agree with the above. Jearld Lesch, DO

## 2020-02-14 ENCOUNTER — Other Ambulatory Visit: Payer: Self-pay

## 2020-02-14 ENCOUNTER — Ambulatory Visit (INDEPENDENT_AMBULATORY_CARE_PROVIDER_SITE_OTHER): Payer: 59 | Admitting: Family Medicine

## 2020-02-14 ENCOUNTER — Encounter (INDEPENDENT_AMBULATORY_CARE_PROVIDER_SITE_OTHER): Payer: Self-pay | Admitting: Family Medicine

## 2020-02-14 VITALS — BP 116/75 | HR 85 | Temp 97.8°F | Ht 62.0 in | Wt 290.0 lb

## 2020-02-14 DIAGNOSIS — E8881 Metabolic syndrome: Secondary | ICD-10-CM

## 2020-02-14 DIAGNOSIS — Z6841 Body Mass Index (BMI) 40.0 and over, adult: Secondary | ICD-10-CM | POA: Diagnosis not present

## 2020-02-14 DIAGNOSIS — I10 Essential (primary) hypertension: Secondary | ICD-10-CM

## 2020-02-15 ENCOUNTER — Encounter (INDEPENDENT_AMBULATORY_CARE_PROVIDER_SITE_OTHER): Payer: Self-pay | Admitting: Family Medicine

## 2020-02-15 DIAGNOSIS — E8881 Metabolic syndrome: Secondary | ICD-10-CM | POA: Insufficient documentation

## 2020-02-15 DIAGNOSIS — E88819 Insulin resistance, unspecified: Secondary | ICD-10-CM | POA: Insufficient documentation

## 2020-02-15 NOTE — Progress Notes (Signed)
Chief Complaint:   OBESITY [name] is here to discuss her progress with her obesity treatment plan along with follow-up of her obesity related diagnoses. [name] is on the Category 3 Plan and states she is following her eating plan approximately 95% of the time. [name] states she is exercising 0 minutes 0 times per week.  Today's visit was #: 3 Starting weight: 307 lbs Starting date: 01/17/2020 Today's weight: 290 lbs Today's date: 02/14/2020 Total lbs lost to date: 17 Total lbs lost since last in-office visit: 0  Interim History: [name] is eating all of the food on the plan. She is saving her extra calories for after supper when she likes to snack. She likes the food on the plan and she is cooking creatively.  Subjective:   Essential hypertension Luberta's blood pressure is very good today. Losartan was increased to 100 mg recently by her PCP.  BP Readings from Last 3 Encounters:  02/14/20 116/75  01/31/20 129/82  01/17/20 (!) 148/83   Lab Results  Component Value Date   CREATININE 0.73 01/17/2020   CREATININE 0.74 12/17/2013   Insulin resistance [name] has a diagnosis of insulin resistance based on her elevated fasting insulin level 9.3. She continues to work on diet and exercise to decrease her risk of diabetes. [name] is not on metformin. She denies polyphagia.  Lab Results  Component Value Date   INSULIN 9.3 01/17/2020   Lab Results  Component Value Date   HGBA1C 5.5 01/17/2020   Assessment/Plan:   Essential hypertension Luretha is working on healthy weight loss and exercise to improve blood pressure control. She will continue all medications. We will watch for signs of hypotension as she continues her lifestyle modifications.  Insulin resistance [name] will continue with the meal plan and she will continue to work on weight loss, exercise, and decreasing simple carbohydrates to help decrease the risk of diabetes. [name]  agreed to follow-up with [place] as directed to closely monitor her progress.  Class 3 severe obesity with serious comorbidity and body mass index (BMI) of 50.0 to 59.9 in adult, unspecified obesity type ([place]) [name] is currently in the action stage of change. As such, her goal is to continue with weight loss efforts. She has agreed to the Category 3 Plan with additional lunch options.   Exercise goals: No exercise has been prescribed at this time.  Behavioral modification strategies: better snacking choices and planning for success.  [name] has agreed to follow-up with our clinic in 2 weeks. She was informed of the importance of frequent follow-up visits to maximize her success with intensive lifestyle modifications for her multiple health conditions.   Objective:   Blood pressure 116/75, pulse 85, temperature 97.8 F (36.6 C), temperature source Oral, height 5\' 2"  (1.575 m), weight 290 lb (131.5 kg), SpO2 97 %. Body mass index is 53.04 kg/m.  General: Cooperative, alert, well developed, in no acute distress. HEENT: Conjunctivae and lids unremarkable. Cardiovascular: Regular rhythm.  Lungs: Normal work of breathing. Neurologic: No focal deficits.   Lab Results  Component Value Date   CREATININE 0.73 01/17/2020   BUN 19 01/17/2020   NA 140 01/17/2020   K 3.9 01/17/2020   CL 102 01/17/2020   CO2 24 01/17/2020   Lab Results  Component Value Date   ALT 16 01/17/2020   AST 22 01/17/2020   ALKPHOS 68 01/17/2020   BILITOT 0.4 01/17/2020   Lab Results  Component Value Date   HGBA1C 5.5 01/17/2020  Lab Results  Component Value Date   INSULIN 9.3 01/17/2020   Lab Results  Component Value Date   TSH 1.360 01/17/2020   Lab Results  Component Value Date   CHOL 239 (H) 01/17/2020   HDL 51 01/17/2020   LDLCALC 167 (H) 01/17/2020   TRIG 119 01/17/2020   Lab Results  Component Value Date   WBC 4.7 12/17/2013   HGB 14.0 12/17/2013   HCT 41.4 12/17/2013   MCV 87.5  12/17/2013   PLT 194 12/17/2013   No results found for: IRON, TIBC, FERRITIN   Ref. Range 01/17/2020 13:26  Vitamin D, 25-Hydroxy Latest Ref Range: 30.0 - 100.0 ng/mL 29.5 (L)    Attestation Statements:   Reviewed by clinician on day of visit: allergies, medications, problem list, medical history, surgical history, family history, social history, and previous encounter notes.  Cristi Loron, am acting as Energy manager for [employer], FNP-C.  I have reviewed the above documentation for accuracy and completeness, and I agree with the above. -  Dawn [employer], FNP-C

## 2020-02-28 ENCOUNTER — Ambulatory Visit (INDEPENDENT_AMBULATORY_CARE_PROVIDER_SITE_OTHER): Payer: 59 | Admitting: Family Medicine

## 2020-03-08 ENCOUNTER — Ambulatory Visit (INDEPENDENT_AMBULATORY_CARE_PROVIDER_SITE_OTHER): Payer: 59 | Admitting: Family Medicine

## 2020-03-08 ENCOUNTER — Other Ambulatory Visit: Payer: Self-pay

## 2020-03-08 ENCOUNTER — Encounter (INDEPENDENT_AMBULATORY_CARE_PROVIDER_SITE_OTHER): Payer: Self-pay | Admitting: Family Medicine

## 2020-03-08 VITALS — BP 123/77 | HR 93 | Temp 98.0°F | Ht 62.0 in | Wt 286.0 lb

## 2020-03-08 DIAGNOSIS — E8881 Metabolic syndrome: Secondary | ICD-10-CM

## 2020-03-08 DIAGNOSIS — K5909 Other constipation: Secondary | ICD-10-CM

## 2020-03-08 DIAGNOSIS — Z6841 Body Mass Index (BMI) 40.0 and over, adult: Secondary | ICD-10-CM

## 2020-03-08 DIAGNOSIS — E559 Vitamin D deficiency, unspecified: Secondary | ICD-10-CM

## 2020-03-08 DIAGNOSIS — Z9189 Other specified personal risk factors, not elsewhere classified: Secondary | ICD-10-CM

## 2020-03-08 MED ORDER — METFORMIN HCL 500 MG PO TABS: 500.0000 mg | ORAL_TABLET | Freq: Every day | ORAL | 0 refills | Status: DC

## 2020-03-08 NOTE — Progress Notes (Signed)
Chief Complaint:   OBESITY [name] is here to discuss her progress with her obesity treatment plan along with follow-up of her obesity related diagnoses. [name] is on the Category 3 Plan and states she is following her eating plan approximately 0% of the time. [name] states she is doing 0 minutes 0 times per week.  Today's visit was #: 4 Starting weight: 307 lbs Starting date: 01/17/2020 Today's weight: 286 lbs Today's date: 03/08/2020 Total lbs lost to date: 21 Total lbs lost since last in-office visit: 4  Interim History: [name] reports being off the plan last week due to helping a family member with their kids and not being at home. She was snacking excessively on foods such as Pringles and jerky but has since gotten rid of the items,  Subjective:   1. Insulin resistance [name] notes polyphagia at night. She is not on metformin. Lab Results  Component Value Date   HGBA1C 5.5 01/17/2020    2. Other constipation [name] notes her BM are less frequent and they have been hard when she deviates from the plan. She notes stools ar less hard when she sticks well to plan.  3. Vitamin D deficiency [name] finished her prescription Vit D and she would like to take OTC Vit D instead. Vitamin D is low at 29.5 (01/17/20).  4. At risk for osteoporosis [name] is at higher risk of osteopenia and osteoporosis due to Vitamin D deficiency.   Assessment/Plan:   1. Insulin resistance [name] will continue to work on weight loss, exercise, and decreasing simple carbohydrates to help decrease the risk of diabetes. [name] agreed to start metformin 500 mg daily with no refills. [name] agreed to follow-up with [place] as directed to closely monitor her progress.  - metFORMIN (GLUCOPHAGE) 500 MG tablet; Take 1 tablet (500 mg total) by mouth daily with supper.  Dispense: 30 tablet; Refill: 0  2. Other constipation [name] was informed that a decrease in bowel movement  frequency is normal while losing weight, but stools should not be hard or painful. She is to increase water, and she may take stool softeners as needed. Orders and follow up as documented in patient record.   Counseling Getting to Good Bowel Health: Your goal is to have one soft bowel movement each day. Drink at least 8 glasses of water each day. Eat plenty of fiber (goal is over 25 grams each day). It is best to get most of your fiber from dietary sources which includes leafy green vegetables, fresh fruit, and whole grains. You may need to add fiber with the help of OTC fiber supplements. These include Metamucil, Citrucel, and Flaxseed. If you are still having trouble, try adding Miralax or Magnesium Citrate. If all of these changes do not work, Dietitian.  3. Vitamin D deficiency Low Vitamin D level contributes to fatigue and are associated with obesity, breast, and colon cancer. [name] agreed to discontinue prescription Vitamin D, and will start OTC Vit D 5,000 IU daily. She will follow-up for routine testing of Vitamin D, at least 2-3 times per year to avoid over-replacement.  4. At risk for osteoporosis [name] was given approximately 15 minutes of osteoporosis prevention counseling today. [name] is at risk for osteopenia and osteoporosis due to her Vitamin D deficiency. She was encouraged to take her Vitamin D and follow her higher calcium diet and increase strengthening exercise to help strengthen her bones and decrease her risk of osteopenia and osteoporosis.  Repetitive spaced learning was  employed today to elicit superior memory formation and behavioral change.  5. Class 3 severe obesity with serious comorbidity and body mass index (BMI) of 50.0 to 59.9 in adult, unspecified obesity type ([place]) [name] is currently in the action stage of change. As such, her goal is to continue with weight loss efforts. She has agreed to the Category 3 Plan.   Handouts given: 100  calorie snacks.  Exercise goals: No exercise has been prescribed at this time.  Behavioral modification strategies: keeping healthy foods in the home and better snacking choices.  [name] has agreed to follow-up with our clinic in 2 weeks. She was informed of the importance of frequent follow-up visits to maximize her success with intensive lifestyle modifications for her multiple health conditions.   Objective:   Blood pressure 123/77, pulse 93, temperature 98 F (36.7 C), height 5\' 2"  (1.575 m), weight 286 lb (129.7 kg), SpO2 98 %. Body mass index is 52.31 kg/m.  General: Cooperative, alert, well developed, in no acute distress. HEENT: Conjunctivae and lids unremarkable. Cardiovascular: Regular rhythm.  Lungs: Normal work of breathing. Neurologic: No focal deficits.   Lab Results  Component Value Date   CREATININE 0.73 01/17/2020   BUN 19 01/17/2020   NA 140 01/17/2020   K 3.9 01/17/2020   CL 102 01/17/2020   CO2 24 01/17/2020   Lab Results  Component Value Date   ALT 16 01/17/2020   AST 22 01/17/2020   ALKPHOS 68 01/17/2020   BILITOT 0.4 01/17/2020   Lab Results  Component Value Date   HGBA1C 5.5 01/17/2020   Lab Results  Component Value Date   INSULIN 9.3 01/17/2020   Lab Results  Component Value Date   TSH 1.360 01/17/2020   Lab Results  Component Value Date   CHOL 239 (H) 01/17/2020   HDL 51 01/17/2020   LDLCALC 167 (H) 01/17/2020   TRIG 119 01/17/2020   Lab Results  Component Value Date   WBC 4.7 12/17/2013   HGB 14.0 12/17/2013   HCT 41.4 12/17/2013   MCV 87.5 12/17/2013   PLT 194 12/17/2013   No results found for: IRON, TIBC, FERRITIN  Attestation Statements:   Reviewed by clinician on day of visit: allergies, medications, problem list, medical history, surgical history, family history, social history, and previous encounter notes.   Wilhemena Durie, am acting as Location manager for [employer], FNP-C.  I have reviewed the above  documentation for accuracy and completeness, and I agree with the above. -  Georgianne Fick, FNP

## 2020-03-09 ENCOUNTER — Encounter (INDEPENDENT_AMBULATORY_CARE_PROVIDER_SITE_OTHER): Payer: Self-pay | Admitting: Family Medicine

## 2020-03-16 ENCOUNTER — Ambulatory Visit: Payer: 59 | Attending: Family

## 2020-03-16 DIAGNOSIS — Z23 Encounter for immunization: Secondary | ICD-10-CM

## 2020-03-16 NOTE — Progress Notes (Signed)
   Covid-19 Vaccination Clinic  Name:  [name]    MRN: [id number] DOB: 1961-06-10  03/16/2020  [name] was observed post Covid-19 immunization for 15 minutes without incident. She was provided with Vaccine Information Sheet and instruction to access the V-Safe system.   [name] was instructed to call 911 with any severe reactions post vaccine: Marland Kitchen Difficulty breathing  . Swelling of face and throat  . A fast heartbeat  . A bad rash all over body  . Dizziness and weakness   Immunizations Administered    Name Date Dose VIS Date Route   Moderna COVID-19 Vaccine 03/16/2020  1:45 PM 0.5 mL 11/30/2019 Intramuscular   Manufacturer: Moderna   Lot: [id number]   NDC: 46568-127-51

## 2020-03-22 ENCOUNTER — Ambulatory Visit (INDEPENDENT_AMBULATORY_CARE_PROVIDER_SITE_OTHER): Payer: 59 | Admitting: Family Medicine

## 2020-03-22 ENCOUNTER — Encounter (INDEPENDENT_AMBULATORY_CARE_PROVIDER_SITE_OTHER): Payer: Self-pay | Admitting: Family Medicine

## 2020-03-22 ENCOUNTER — Other Ambulatory Visit: Payer: Self-pay

## 2020-03-22 VITALS — BP 117/73 | HR 88 | Temp 97.9°F | Ht 62.0 in | Wt 282.0 lb

## 2020-03-22 DIAGNOSIS — Z6841 Body Mass Index (BMI) 40.0 and over, adult: Secondary | ICD-10-CM | POA: Diagnosis not present

## 2020-03-22 DIAGNOSIS — E8881 Metabolic syndrome: Secondary | ICD-10-CM | POA: Diagnosis not present

## 2020-03-22 DIAGNOSIS — I10 Essential (primary) hypertension: Secondary | ICD-10-CM

## 2020-03-23 ENCOUNTER — Encounter (INDEPENDENT_AMBULATORY_CARE_PROVIDER_SITE_OTHER): Payer: Self-pay | Admitting: Family Medicine

## 2020-03-23 NOTE — Progress Notes (Signed)
Chief Complaint:   OBESITY [name] is here to discuss her progress with her obesity treatment plan along with follow-up of her obesity related diagnoses. [name] is on the Category 3 Plan and states she is following her eating plan approximately 80% of the time. Tanveer states she is doing 0 minutes 0 times per week.  Today's visit was #: 5 Starting weight: 307 lbs Starting date: 01/17/2020 Today's weight: 282 lbs Today's date: 03/22/2020 Total lbs lost to date: 25 Total lbs lost since last in-office visit: 4  Interim History: [name] has done well with the plan. She is very happy with Category 3 plan. Her water intake is good and she is getting 60-80 oz per day.  Subjective:   1. Insulin resistance [name] feels that metformin prescribed at her last visit helps with appetite. She denies nausea or diarrhea. Lab Results  Component Value Date   HGBA1C 5.5 01/17/2020    2. Essential hypertension [name]'s blood pressure is well controlled. She reports swelling in the ankles and feet. She is on hydrochlorothiazide and losartan. BP Readings from Last 3 Encounters:  03/22/20 117/73  03/08/20 123/77  02/14/20 116/75     Assessment/Plan:   1. Insulin resistance [name] will continue to work on weight loss, exercise, and decreasing simple carbohydrates to help decrease the risk of diabetes. [name] agreed to continue metformin, and she agreed to follow-up with [place] as directed to closely monitor her progress.  2. Essential hypertension [name] is working on healthy weight loss and exercise to improve blood pressure control. We will watch for signs of hypotension as she continues her lifestyle modifications. [name] agreed to continue all of her medications.  3. Class 3 severe obesity with serious comorbidity and body mass index (BMI) of 50.0 to 59.9 in adult, unspecified obesity type ([facility]) [name] is currently in the action stage of change. As such, her goal  is to continue with weight loss efforts. She has agreed to the Category 3 Plan.   Exercise goals: [name] is to consider starting exercise.  Behavioral modification strategies: increasing lean protein intake and meal planning and cooking strategies.  [name] has agreed to follow-up with our clinic in 2 weeks. She was informed of the importance of frequent follow-up visits to maximize her success with intensive lifestyle modifications for her multiple health conditions.   Objective:   Blood pressure 117/73, pulse 88, temperature 97.9 F (36.6 C), temperature source Oral, height 5\' 2"  (1.575 m), weight 282 lb (127.9 kg), SpO2 98 %. Body mass index is 51.58 kg/m.  General: Cooperative, alert, well developed, in no acute distress. HEENT: Conjunctivae and lids unremarkable. Cardiovascular: Regular rhythm.  Lungs: Normal work of breathing. Neurologic: No focal deficits.   Lab Results  Component Value Date   CREATININE 0.73 01/17/2020   BUN 19 01/17/2020   NA 140 01/17/2020   K 3.9 01/17/2020   CL 102 01/17/2020   CO2 24 01/17/2020   Lab Results  Component Value Date   ALT 16 01/17/2020   AST 22 01/17/2020   ALKPHOS 68 01/17/2020   BILITOT 0.4 01/17/2020   Lab Results  Component Value Date   HGBA1C 5.5 01/17/2020   Lab Results  Component Value Date   INSULIN 9.3 01/17/2020   Lab Results  Component Value Date   TSH 1.360 01/17/2020   Lab Results  Component Value Date   CHOL 239 (H) 01/17/2020   HDL 51 01/17/2020   LDLCALC 167 (H) 01/17/2020   TRIG 119 01/17/2020  Lab Results  Component Value Date   WBC 4.7 12/17/2013   HGB 14.0 12/17/2013   HCT 41.4 12/17/2013   MCV 87.5 12/17/2013   PLT 194 12/17/2013   No results found for: IRON, TIBC, FERRITIN  Attestation Statements:   Reviewed by clinician on day of visit: allergies, medications, problem list, medical history, surgical history, family history, social history, and previous encounter notes.   Wilhemena Durie, am acting as Location manager for [employer], FNP-C.  I have reviewed the above documentation for accuracy and completeness, and I agree with the above. -  Georgianne Fick, FNP

## 2020-04-05 ENCOUNTER — Ambulatory Visit (INDEPENDENT_AMBULATORY_CARE_PROVIDER_SITE_OTHER): Payer: 59 | Admitting: Family Medicine

## 2020-04-05 ENCOUNTER — Other Ambulatory Visit: Payer: Self-pay

## 2020-04-05 ENCOUNTER — Encounter (INDEPENDENT_AMBULATORY_CARE_PROVIDER_SITE_OTHER): Payer: Self-pay | Admitting: Family Medicine

## 2020-04-05 VITALS — BP 135/76 | HR 88 | Temp 98.3°F | Ht 62.0 in | Wt 279.0 lb

## 2020-04-05 DIAGNOSIS — Z9189 Other specified personal risk factors, not elsewhere classified: Secondary | ICD-10-CM | POA: Diagnosis not present

## 2020-04-05 DIAGNOSIS — E8881 Metabolic syndrome: Secondary | ICD-10-CM | POA: Diagnosis not present

## 2020-04-05 DIAGNOSIS — E7849 Other hyperlipidemia: Secondary | ICD-10-CM | POA: Diagnosis not present

## 2020-04-05 DIAGNOSIS — Z6841 Body Mass Index (BMI) 40.0 and over, adult: Secondary | ICD-10-CM

## 2020-04-05 MED ORDER — METFORMIN HCL 500 MG PO TABS: 500.0000 mg | ORAL_TABLET | Freq: Two times a day (BID) | ORAL | 0 refills | Status: DC

## 2020-04-06 ENCOUNTER — Encounter (INDEPENDENT_AMBULATORY_CARE_PROVIDER_SITE_OTHER): Payer: Self-pay | Admitting: Family Medicine

## 2020-04-06 NOTE — Progress Notes (Signed)
Chief Complaint:   OBESITY [name] is here to discuss her progress with her obesity treatment plan along with follow-up of her obesity related diagnoses. [name] is on the Category 3 Plan and states she is following her eating plan approximately 90% of the time. [name] states she is on the recumbent bike for 30 minutes 2 times per week.  Today's visit was #: 6 Starting weight: 307 lbs Starting date: 01/17/2020 Today's weight: 279 lbs Today's date: 04/05/2020 Total lbs lost to date: 28 Total lbs lost since last in-office visit: 3  Interim History: [name] reports being off the plan for one day on Easter, but she got directly back on the plan. She remains very committed to the plan. Her hunger is satisfied. She is trying to increase exercise but struggles with knee pain.  Subjective:   1. Insulin resistance [name] denies polyphagia. She is on metformin BID. She feels this helps with hunger. She denies nausea or diarrhea from metformin.  2. Other hyperlipidemia [name] was started on Crestor by her primary care physician, and he will recheck her FLP in 2 months. She has not picked up the Crestor yet. Last LDL was 173 and HDL was low at 46.  The 10-year ASCVD risk score [name] Bussing DC Brooke Bonito., et al., 2013) is: 5.8%   Values used to calculate the score:     Age: 59 years     Sex: Female     Is Non-Hispanic African American: No     Diabetic: No     Tobacco smoker: No     Systolic Blood Pressure: 449 mmHg     Is BP treated: Yes     HDL Cholesterol: 51 mg/dL     Total Cholesterol: 234 mg/dL   3. At risk for heart disease [name] is at a higher than average risk for cardiovascular disease due to obesity, HLD, and insulin resistance.  Assessment/Plan:   1. Insulin resistance [name] will continue to work on weight loss, exercise, and decreasing simple carbohydrates to help decrease the risk of diabetes. We will refill metformin for 1 month. [name] agreed to follow-up  with [place] as directed to closely monitor her progress.  - metFORMIN (GLUCOPHAGE) 500 MG tablet; Take 1 tablet (500 mg total) by mouth 2 (two) times daily with a meal.  Dispense: 60 tablet; Refill: 0  2. Other hyperlipidemia Cardiovascular risk and specific lipid/LDL goals reviewed. We discussed several lifestyle modifications today and [name] will continue to work on diet and weight loss efforts. [name] will start Crestor and continue to exercise to increase HDL.Marland Kitchen  3. At risk for heart disease [name] was given approximately 15 minutes of coronary artery disease prevention counseling today. She is 59 y.o. female and has risk factors for heart disease including obesity. We discussed intensive lifestyle modifications today with an emphasis on specific weight loss instructions and strategies.   Repetitive spaced learning was employed today to elicit superior memory formation and behavioral change.  4. Class 3 severe obesity with serious comorbidity and body mass index (BMI) of 50.0 to 59.9 in adult, unspecified obesity type ([name]) [name] is currently in the action stage of change. As such, her goal is to continue with weight loss efforts. She has agreed to the Category 3 Plan.   Exercise goals: As is.  Behavioral modification strategies: planning for success.  [name] has agreed to follow-up with our clinic in 3 weeks. She was informed of the importance of frequent follow-up visits to maximize her success with  intensive lifestyle modifications for her multiple health conditions.   Objective:   Blood pressure 135/76, pulse 88, temperature 98.3 F (36.8 C), temperature source Oral, height 5\' 2"  (1.575 m), weight 279 lb (126.6 kg), SpO2 96 %. Body mass index is 51.03 kg/m.  General: Cooperative, alert, well developed, in no acute distress. HEENT: Conjunctivae and lids unremarkable. Cardiovascular: Regular rhythm.  Lungs: Normal work of breathing. Neurologic: No focal deficits.    Lab Results  Component Value Date   CREATININE 0.73 01/17/2020   BUN 19 01/17/2020   NA 140 01/17/2020   K 3.9 01/17/2020   CL 102 01/17/2020   CO2 24 01/17/2020   Lab Results  Component Value Date   ALT 16 01/17/2020   AST 22 01/17/2020   ALKPHOS 68 01/17/2020   BILITOT 0.4 01/17/2020   Lab Results  Component Value Date   HGBA1C 5.5 01/17/2020   Lab Results  Component Value Date   INSULIN 9.3 01/17/2020   Lab Results  Component Value Date   TSH 1.360 01/17/2020   Lab Results  Component Value Date   CHOL 239 (H) 01/17/2020   HDL 51 01/17/2020   LDLCALC 167 (H) 01/17/2020   TRIG 119 01/17/2020   Lab Results  Component Value Date   WBC 4.7 12/17/2013   HGB 14.0 12/17/2013   HCT 41.4 12/17/2013   MCV 87.5 12/17/2013   PLT 194 12/17/2013   No results found for: IRON, TIBC, FERRITIN  Attestation Statements:   Reviewed by clinician on day of visit: allergies, medications, problem list, medical history, surgical history, family history, social history, and previous encounter notes.   12/19/2013, am acting as Trude Mcburney for Energy manager, FNP-C.  I have reviewed the above documentation for accuracy and completeness, and I agree with the above. -  [place], FNP

## 2020-04-18 ENCOUNTER — Ambulatory Visit: Payer: 59 | Attending: Family

## 2020-04-18 DIAGNOSIS — Z23 Encounter for immunization: Secondary | ICD-10-CM

## 2020-04-18 NOTE — Progress Notes (Signed)
   Covid-19 Vaccination Clinic  Name:  [name]    MRN: [id number] DOB: [birth year]  04/18/2020  [name] was observed post Covid-19 immunization for 15 minutes without incident. She was provided with Vaccine Information Sheet and instruction to access the V-Safe system.   [name] was instructed to call 911 with any severe reactions post vaccine: Marland Kitchen Difficulty breathing  . Swelling of face and throat  . A fast heartbeat  . A bad rash all over body  . Dizziness and weakness   Immunizations Administered    Name Date Dose VIS Date Route   Moderna COVID-19 Vaccine 04/18/2020  2:11 PM 0.5 mL 11/2019 Intramuscular   Manufacturer: Moderna   Lot: [id number]   NDC: 30131-438-88

## 2020-04-26 ENCOUNTER — Ambulatory Visit (INDEPENDENT_AMBULATORY_CARE_PROVIDER_SITE_OTHER): Payer: 59 | Admitting: Family Medicine

## 2020-05-02 ENCOUNTER — Ambulatory Visit (INDEPENDENT_AMBULATORY_CARE_PROVIDER_SITE_OTHER): Payer: 59 | Admitting: Family Medicine

## 2020-05-02 ENCOUNTER — Encounter (INDEPENDENT_AMBULATORY_CARE_PROVIDER_SITE_OTHER): Payer: Self-pay | Admitting: Family Medicine

## 2020-05-02 ENCOUNTER — Other Ambulatory Visit: Payer: Self-pay

## 2020-05-02 VITALS — BP 144/84 | HR 75 | Temp 98.1°F | Ht 62.0 in | Wt 269.0 lb

## 2020-05-02 DIAGNOSIS — I10 Essential (primary) hypertension: Secondary | ICD-10-CM | POA: Diagnosis not present

## 2020-05-02 DIAGNOSIS — Z6841 Body Mass Index (BMI) 40.0 and over, adult: Secondary | ICD-10-CM | POA: Diagnosis not present

## 2020-05-02 DIAGNOSIS — E8881 Metabolic syndrome: Secondary | ICD-10-CM | POA: Diagnosis not present

## 2020-05-02 MED ORDER — METFORMIN HCL 500 MG PO TABS: 500.0000 mg | ORAL_TABLET | Freq: Two times a day (BID) | ORAL | 0 refills | Status: DC

## 2020-05-03 ENCOUNTER — Encounter (INDEPENDENT_AMBULATORY_CARE_PROVIDER_SITE_OTHER): Payer: Self-pay | Admitting: Family Medicine

## 2020-05-03 NOTE — Progress Notes (Signed)
Chief Complaint:   OBESITY [name] is here to discuss her progress with her obesity treatment plan along with follow-up of her obesity related diagnoses. [name] is on the Category 3 Plan and states she is following her eating plan approximately 75% of the time. [name] states she is on the recumbent bike for 30 minutes 3 times per week.  Today's visit was #: 6 Starting weight: 307 lbs Starting date: 01/17/2020 Today's weight: 269 lbs Today's date: 05/02/2020 Total lbs lost to date: 38 Total lbs lost since last in-office visit: 10  Interim History: [name] has started back to the gym 3 times per week for 30 minutes. She is quite happy with her weight loss and has lost 38 lbs overall. She needs to lose weight to be a candidate for a total knee replacement.   Subjective:   1. Insulin resistance [name] denies nausea or diarrhea with metformin. She denies polyphagia. Lab Results  Component Value Date   HGBA1C 5.5 01/17/2020    2. Essential hypertension Anaija's blood pressure is elevated today at 144/84. She is compliant with all of her medications. Her blood pressure is usually within normal limits. BP Readings from Last 3 Encounters:  05/02/20 (!) 144/84  04/05/20 135/76  03/22/20 117/73    Assessment/Plan:   1. Insulin resistance [name] will continue to work on weight loss, exercise, and decreasing simple carbohydrates to help decrease the risk of diabetes. We will refill metformin for 1 month. [name] agreed to follow-up with [name] as directed to closely monitor her progress.  - metFORMIN (GLUCOPHAGE) 500 MG tablet; Take 1 tablet (500 mg total) by mouth 2 (two) times daily with a meal.  Dispense: 60 tablet; Refill: 0  2. Essential hypertension [name] will continue losartan and hydrochlorothiazide, and she will continue to work on healthy weight loss and exercise to improve blood pressure control. We will watch for signs of hypotension as she continues  her lifestyle modifications.  3. Class 3 severe obesity with serious comorbidity and body mass index (BMI) of 45.0 to 49.9 in adult, unspecified obesity type ([facility]) [name] is currently in the action stage of change. As such, her goal is to continue with weight loss efforts. She has agreed to the Category 3 Plan and keeping a food journal and adhering to recommended goals of 450-600 calories and 40 grams of protein at supper daily.   Handout given today: Food Journaling.  Exercise goals: As is.  Behavioral modification strategies: meal planning and cooking strategies, better snacking choices and planning for success.  [name] has agreed to follow-up with our clinic in 3 weeks. She was informed of the importance of frequent follow-up visits to maximize her success with intensive lifestyle modifications for her multiple health conditions.   Objective:   Blood pressure (!) 144/84, pulse 75, temperature 98.1 F (36.7 C), temperature source Oral, height 5\' 2"  (1.575 m), weight 269 lb (122 kg), SpO2 98 %. Body mass index is 49.2 kg/m.  General: Cooperative, alert, well developed, in no acute distress. HEENT: Conjunctivae and lids unremarkable. Cardiovascular: Regular rhythm.  Lungs: Normal work of breathing. Neurologic: No focal deficits.   Lab Results  Component Value Date   CREATININE 0.73 01/17/2020   BUN 19 01/17/2020   NA 140 01/17/2020   K 3.9 01/17/2020   CL 102 01/17/2020   CO2 24 01/17/2020   Lab Results  Component Value Date   ALT 16 01/17/2020   AST 22 01/17/2020   ALKPHOS 68 01/17/2020  BILITOT 0.4 01/17/2020   Lab Results  Component Value Date   HGBA1C 5.5 01/17/2020   Lab Results  Component Value Date   INSULIN 9.3 01/17/2020   Lab Results  Component Value Date   TSH 1.360 01/17/2020   Lab Results  Component Value Date   CHOL 239 (H) 01/17/2020   HDL 51 01/17/2020   LDLCALC 167 (H) 01/17/2020   TRIG 119 01/17/2020   Lab Results  Component  Value Date   WBC 4.7 12/17/2013   HGB 14.0 12/17/2013   HCT 41.4 12/17/2013   MCV 87.5 12/17/2013   PLT 194 12/17/2013   No results found for: IRON, TIBC, FERRITIN  Obesity Behavioral Intervention Documentation for Insurance:   Approximately 15 minutes were spent on the discussion below.  ASK: We discussed the diagnosis of obesity with Adiana today and Caleigha agreed to give [name] permission to discuss obesity behavioral modification therapy today.  ASSESS: [name] has the diagnosis of obesity and her BMI today is 49.19. [name] is in the action stage of change.   ADVISE: [name] was educated on the multiple health risks of obesity as well as the benefit of weight loss to improve her health. She was advised of the need for long term treatment and the importance of lifestyle modifications to improve her current health and to decrease her risk of future health problems.  AGREE: Multiple dietary modification options and treatment options were discussed and [name] agreed to follow the recommendations documented in the above note.  ARRANGE: [name] was educated on the importance of frequent visits to treat obesity as outlined per CMS and USPSTF guidelines and agreed to schedule her next follow up appointment today.  Attestation Statements:   Reviewed by clinician on day of visit: allergies, medications, problem list, medical history, surgical history, family history, social history, and previous encounter notes.   [name], am acting as Location manager for [employer], FNP-C.  I have reviewed the above documentation for accuracy and completeness, and I agree with the above. -  [name] Fick, FNP

## 2020-05-30 ENCOUNTER — Ambulatory Visit (INDEPENDENT_AMBULATORY_CARE_PROVIDER_SITE_OTHER): Payer: 59 | Admitting: Family Medicine

## 2020-05-30 ENCOUNTER — Encounter (INDEPENDENT_AMBULATORY_CARE_PROVIDER_SITE_OTHER): Payer: Self-pay | Admitting: Family Medicine

## 2020-05-30 ENCOUNTER — Other Ambulatory Visit: Payer: Self-pay

## 2020-05-30 VITALS — BP 123/77 | HR 83 | Temp 98.0°F | Ht 62.0 in | Wt 261.0 lb

## 2020-05-30 DIAGNOSIS — I10 Essential (primary) hypertension: Secondary | ICD-10-CM | POA: Diagnosis not present

## 2020-05-30 DIAGNOSIS — Z6841 Body Mass Index (BMI) 40.0 and over, adult: Secondary | ICD-10-CM

## 2020-05-30 DIAGNOSIS — E8881 Metabolic syndrome: Secondary | ICD-10-CM | POA: Diagnosis not present

## 2020-05-30 DIAGNOSIS — E88819 Insulin resistance, unspecified: Secondary | ICD-10-CM

## 2020-05-31 ENCOUNTER — Encounter (INDEPENDENT_AMBULATORY_CARE_PROVIDER_SITE_OTHER): Payer: Self-pay | Admitting: Family Medicine

## 2020-05-31 NOTE — Progress Notes (Signed)
Chief Complaint:   OBESITY [name] is here to discuss her progress with her obesity treatment plan along with follow-up of her obesity related diagnoses. [name] is on the Category 3 Plan and keeping a food journal and adhering to recommended goals of 450-600 calories and 40 grams of protein at supper daily and states she is following her eating plan approximately 50% of the time. [name] states she is riding the recumbent bike for 30 minutes 5 times per week.  Today's visit was #: 7 Starting weight: 307 lbs Starting date: 01/17/2020 Today's weight: 261 lbs Today's date: 05/30/2020 Total lbs lost to date: 46 Total lbs lost since last in-office visit: 8  Interim History: [name] had a recent trip to the beach and also celebrated [name]. She notes she was off the plan. However, she has lost 8 lbs since last OV. She is not eating snack calories every day.  Subjective:   1. Essential hypertension [name]'s blood pressure is much better today and it was elevated at her last office visit. She is on hydrochlorothaizide and losartan. Cardiovascular ROS: no chest pain or dyspnea on exertion.  BP Readings from Last 3 Encounters:  05/30/20 123/77  05/02/20 (!) 144/84  04/05/20 135/76   Lab Results  Component Value Date   CREATININE 0.73 01/17/2020   CREATININE 0.74 12/17/2013   2. Insulin resistance [name] has a diagnosis of insulin resistance based on her elevated fasting insulin level >5. [name] is on metformin BID. She notes polyphagia at night. Last A1c was 5.5. She continues to work on diet and exercise to decrease her risk of diabetes.  Lab Results  Component Value Date   INSULIN 9.3 01/17/2020   Lab Results  Component Value Date   HGBA1C 5.5 01/17/2020   Assessment/Plan:   1. Essential hypertension [name] will continue all of her medications, and will continue working on healthy weight loss and exercise to improve blood pressure control. We will  watch for signs of hypotension as she continues her lifestyle modifications.  2. Insulin resistance [name] will continue to work on weight loss, exercise, and decreasing simple carbohydrates to help decrease the risk of diabetes. She is to have a protein snack after supper. [name] agreed to follow-up with [place] as directed to closely monitor her progress.  3. Class 3 severe obesity with serious comorbidity and body mass index (BMI) of 45.0 to 49.9 in adult, unspecified obesity type (HCC) [name] is currently in the action stage of change. As such, her goal is to continue with weight loss efforts. She has agreed to the Category 3 Plan or keeping a food journal and adhering to recommended goals of 1400-1500 calories and 85 grams of protein daily.   Exercise goals: As is, and she is to start resistance training 2 times per week.  Behavioral modification strategies: better snacking choices.  [name] has agreed to follow-up with our clinic in 4 to 5 weeks at her request. She was informed of the importance of frequent follow-up visits to maximize her success with intensive lifestyle modifications for her multiple health conditions.   Objective:   Blood pressure 123/77, pulse 83, temperature 98 F (36.7 C), temperature source Oral, height 5\' 2"  (1.575 m), weight 261 lb (118.4 kg), SpO2 98 %. Body mass index is 47.74 kg/m.  General: Cooperative, alert, well developed, in no acute distress. HEENT: Conjunctivae and lids unremarkable. Cardiovascular: Regular rhythm.  Lungs: Normal work of breathing. Neurologic: No focal deficits.   Lab Results  Component  Value Date   CREATININE 0.73 01/17/2020   BUN 19 01/17/2020   NA 140 01/17/2020   K 3.9 01/17/2020   CL 102 01/17/2020   CO2 24 01/17/2020   Lab Results  Component Value Date   ALT 16 01/17/2020   AST 22 01/17/2020   ALKPHOS 68 01/17/2020   BILITOT 0.4 01/17/2020   Lab Results  Component Value Date   HGBA1C 5.5 01/17/2020    Lab Results  Component Value Date   INSULIN 9.3 01/17/2020   Lab Results  Component Value Date   TSH 1.360 01/17/2020   Lab Results  Component Value Date   CHOL 239 (H) 01/17/2020   HDL 51 01/17/2020   LDLCALC 167 (H) 01/17/2020   TRIG 119 01/17/2020   Lab Results  Component Value Date   WBC 4.7 12/17/2013   HGB 14.0 12/17/2013   HCT 41.4 12/17/2013   MCV 87.5 12/17/2013   PLT 194 12/17/2013   No results found for: IRON, TIBC, FERRITIN  Attestation Statements:   Reviewed by clinician on day of visit: allergies, medications, problem list, medical history, surgical history, family history, social history, and previous encounter notes.   Wilhemena Durie, am acting as Location manager for [employer], FNP-C.  I have reviewed the above documentation for accuracy and completeness, and I agree with the above. -  Georgianne Fick, FNP

## 2020-06-29 ENCOUNTER — Ambulatory Visit (INDEPENDENT_AMBULATORY_CARE_PROVIDER_SITE_OTHER): Payer: 59 | Admitting: Family Medicine

## 2020-07-13 ENCOUNTER — Encounter (INDEPENDENT_AMBULATORY_CARE_PROVIDER_SITE_OTHER): Payer: Self-pay | Admitting: Family Medicine

## 2020-07-13 ENCOUNTER — Other Ambulatory Visit: Payer: Self-pay

## 2020-07-13 ENCOUNTER — Ambulatory Visit (INDEPENDENT_AMBULATORY_CARE_PROVIDER_SITE_OTHER): Payer: 59 | Admitting: Family Medicine

## 2020-07-13 VITALS — BP 110/67 | HR 110 | Temp 98.0°F | Ht 62.0 in | Wt 251.0 lb

## 2020-07-13 DIAGNOSIS — E559 Vitamin D deficiency, unspecified: Secondary | ICD-10-CM

## 2020-07-13 DIAGNOSIS — Z6841 Body Mass Index (BMI) 40.0 and over, adult: Secondary | ICD-10-CM

## 2020-07-13 DIAGNOSIS — Z9189 Other specified personal risk factors, not elsewhere classified: Secondary | ICD-10-CM

## 2020-07-13 DIAGNOSIS — E7849 Other hyperlipidemia: Secondary | ICD-10-CM

## 2020-07-13 DIAGNOSIS — E8881 Metabolic syndrome: Secondary | ICD-10-CM

## 2020-07-14 LAB — VITAMIN D 25 HYDROXY (VIT D DEFICIENCY, FRACTURES): Vit D, 25-Hydroxy: 34.9 ng/mL (ref 30.0–100.0)

## 2020-07-14 LAB — HEMOGLOBIN A1C
Est. average glucose Bld gHb Est-mCnc: 108 mg/dL
Hgb A1c MFr Bld: 5.4 % (ref 4.8–5.6)

## 2020-07-14 LAB — INSULIN, RANDOM: INSULIN: 7.3 u[IU]/mL (ref 2.6–24.9)

## 2020-07-19 ENCOUNTER — Encounter (INDEPENDENT_AMBULATORY_CARE_PROVIDER_SITE_OTHER): Payer: Self-pay | Admitting: Family Medicine

## 2020-07-19 NOTE — Progress Notes (Signed)
Chief Complaint:   OBESITY [name] is here to discuss her progress with her obesity treatment plan along with follow-up of her obesity related diagnoses. [name] is on the Category 3 Plan or keeping a food journal and adhering to recommended goals of 1400-1500 calories and 85 grams of protein daily and states she is following her eating plan approximately 5% of the time. [name] states she is doing 0 minutes 0 times per week.  Today's visit was #: 8 Starting weight: 307 lbs Starting date: 01/17/2020 Today's weight: 251 lbs Today's date: 07/13/2020 Total lbs lost to date: 56 Total lbs lost since last in-office visit: 10  Interim History: [name] notes being off the plan somewhat but she has lost 10 lbs over the last 6 weeks. She admits to having more carbohydrates than usual. She still ate plenty of protein. She is now back on the plan.  Subjective:   1. Vitamin D deficiency [name]'s last Vit D level was low at 29.5. She is on OTC Vit D3 5,000 IU daily.  2. Other hyperlipidemia [name] has hyperlipidemia and has been trying to improve her cholesterol levels with intensive lifestyle modification including a low saturated fat diet, exercise and weight loss. [name]'s primary care physician added Crestor 10 mg, and her LDL is down to 114 from 173. Last FLP was done on 06/02/2020. .  Lab Results  Component Value Date   ALT 16 01/17/2020   AST 22 01/17/2020   ALKPHOS 68 01/17/2020   BILITOT 0.4 01/17/2020   Lab Results  Component Value Date   CHOL 239 (H) 01/17/2020   HDL 51 01/17/2020   LDLCALC 167 (H) 01/17/2020   TRIG 119 01/17/2020   3. Insulin resistance [name] has a diagnosis of insulin resistance based on her elevated fasting insulin level >5. [name] is on metformin, and her most recent fasting glucose was 106. Her hunger is well controlled. She continues to work on diet and exercise to decrease her risk of diabetes.  Lab Results  Component Value  Date   INSULIN 7.3 07/13/2020   INSULIN 9.3 01/17/2020   Lab Results  Component Value Date   HGBA1C 5.4 07/13/2020   4. At risk for diabetes mellitus [name] is at higher than average risk for developing diabetes due to her obesity, increased fasting glucose, and family history of diabetes mellitus.  Assessment/Plan:   1. Vitamin D deficiency Low Vitamin D level contributes to fatigue and are associated with obesity, breast, and colon cancer. We will check labs today. [name] will follow-up for routine testing of Vitamin D, at least 2-3 times per year to avoid over-replacement.  - VITAMIN D 25 Hydroxy (Vit-D Deficiency, Fractures)  2. Other hyperlipidemia Cardiovascular risk and specific lipid/LDL goals reviewed. We discussed several lifestyle modifications today. [name] will continue Crestor 10 mg, and she will continue to work on diet, exercise and weight loss efforts. Orders and follow up as documented in patient record.    3. Insulin resistance [name] will continue to work on weight loss, exercise, and decreasing simple carbohydrates to help decrease the risk of diabetes. We will check labs today. [name] agreed to follow-up with [place] as directed to closely monitor her progress.  - Hemoglobin A1c - Insulin, random  4. At risk for diabetes mellitus [name] was given approximately 15 minutes of diabetes education and counseling today. We discussed intensive lifestyle modifications today with an emphasis on weight loss as well as increasing exercise and decreasing simple carbohydrates in her diet. We  also reviewed medication options with an emphasis on risk versus benefit of those discussed.   Repetitive spaced learning was employed today to elicit superior memory formation and behavioral change.  5. Class 3 severe obesity with serious comorbidity and body mass index (BMI) of 45.0 to 49.9 in adult, unspecified obesity type (HCC) [name] is currently in the action  stage of change. As such, her goal is to continue with weight loss efforts. She has agreed to the Category 3 Plan.   Exercise goals: [name] will start back to the gym. She will start resistance training.  Behavioral modification strategies: decreasing simple carbohydrates.  [name] has agreed to follow-up with our clinic in 4 weeks. She was informed of the importance of frequent follow-up visits to maximize her success with intensive lifestyle modifications for her multiple health conditions.   [name] was informed we would discuss her lab results at her next visit unless there is a critical issue that needs to be addressed sooner. [name] agreed to keep her next visit at the agreed upon time to discuss these results.  Objective:   Blood pressure 110/67, pulse (!) 110, temperature 98 F (36.7 C), temperature source Oral, height 5\' 2"  (1.575 m), weight 251 lb (113.9 kg), SpO2 99 %. Body mass index is 45.91 kg/m.  General: Cooperative, alert, well developed, in no acute distress. HEENT: Conjunctivae and lids unremarkable. Cardiovascular: Regular rhythm.  Lungs: Normal work of breathing. Neurologic: No focal deficits.   Lab Results  Component Value Date   CREATININE 0.73 01/17/2020   BUN 19 01/17/2020   NA 140 01/17/2020   K 3.9 01/17/2020   CL 102 01/17/2020   CO2 24 01/17/2020   Lab Results  Component Value Date   ALT 16 01/17/2020   AST 22 01/17/2020   ALKPHOS 68 01/17/2020   BILITOT 0.4 01/17/2020   Lab Results  Component Value Date   HGBA1C 5.4 07/13/2020   HGBA1C 5.5 01/17/2020   Lab Results  Component Value Date   INSULIN 7.3 07/13/2020   INSULIN 9.3 01/17/2020   Lab Results  Component Value Date   TSH 1.360 01/17/2020   Lab Results  Component Value Date   CHOL 239 (H) 01/17/2020   HDL 51 01/17/2020   LDLCALC 167 (H) 01/17/2020   TRIG 119 01/17/2020   Lab Results  Component Value Date   WBC 4.7 12/17/2013   HGB 14.0 12/17/2013   HCT 41.4  12/17/2013   MCV 87.5 12/17/2013   PLT 194 12/17/2013   No results found for: IRON, TIBC, FERRITIN  Attestation Statements:   Reviewed by clinician on day of visit: allergies, medications, problem list, medical history, surgical history, family history, social history, and previous encounter notes.   12/19/2013, am acting as Trude Mcburney for Energy manager, FNP-C.  I have reviewed the above documentation for accuracy and completeness, and I agree with the above. -  [place], FNP

## 2020-08-08 ENCOUNTER — Ambulatory Visit (INDEPENDENT_AMBULATORY_CARE_PROVIDER_SITE_OTHER): Payer: 59 | Admitting: Family Medicine

## 2020-08-17 ENCOUNTER — Encounter (INDEPENDENT_AMBULATORY_CARE_PROVIDER_SITE_OTHER): Payer: Self-pay | Admitting: Family Medicine

## 2020-08-17 ENCOUNTER — Ambulatory Visit (INDEPENDENT_AMBULATORY_CARE_PROVIDER_SITE_OTHER): Payer: 59 | Admitting: Family Medicine

## 2020-08-17 ENCOUNTER — Other Ambulatory Visit: Payer: Self-pay

## 2020-08-17 VITALS — BP 116/74 | HR 81 | Temp 98.0°F | Ht 62.0 in | Wt 246.0 lb

## 2020-08-17 DIAGNOSIS — E559 Vitamin D deficiency, unspecified: Secondary | ICD-10-CM | POA: Diagnosis not present

## 2020-08-17 DIAGNOSIS — E8881 Metabolic syndrome: Secondary | ICD-10-CM | POA: Diagnosis not present

## 2020-08-17 DIAGNOSIS — Z6841 Body Mass Index (BMI) 40.0 and over, adult: Secondary | ICD-10-CM

## 2020-08-17 NOTE — Progress Notes (Signed)
Chief Complaint:   OBESITY [name] is here to discuss her progress with her obesity treatment plan along with follow-up of her obesity related diagnoses. [name] is on the Category 3 Plan and states she is following her eating plan approximately 0% of the time. Baili states she is at the gym for 30 minutes 2-3 times per week.  Today's visit was #: 9 Starting weight: 307 lbs Starting date: 01/17/2020 Today's weight: 246 lbs Today's date: 08/17/2020 Total lbs lost to date: 61 Total lbs lost since last in-office visit: 5  Interim History: [name] is having to get take out because her kitchen is being renovated. She notes she has not been following the plan but is making good choices. She has lost 5 lbs today. She is limiting her carbohydrates.  She needs to lose down to 225 lbs for knee surgery. She is also having severe back pain which is being managed by Neurology.  Subjective:   1. Vitamin D deficiency [name] is on OTC Vit D 5,000 IU daily. Her Vit D level has increased to 34.9 from 29.5. I discussed labs with the patient today.  2. Insulin resistance [name] has a diagnosis of insulin resistance based on her elevated fasting insulin level >5. [name] is on metformin 500 mg BID, but she has not been taking it. She denies polyphagia. She continues to work on diet and exercise to decrease her risk of diabetes.  Lab Results  Component Value Date   INSULIN 7.3 07/13/2020   INSULIN 9.3 01/17/2020   Lab Results  Component Value Date   HGBA1C 5.4 07/13/2020   Assessment/Plan:   1. Vitamin D deficiency Low Vitamin D level contributes to fatigue and are associated with obesity, breast, and colon cancer. [name] agreed to continue taking OTC Vit D 5,000 IU daily and will follow-up for routine testing of Vitamin D, at least 2-3 times per year to avoid over-replacement.  2. Insulin resistance [name] will continue to work on weight loss, exercise, and decreasing  simple carbohydrates to help decrease the risk of diabetes. [name] will restart metformin once her kitchen is complete. [name] agreed to follow-up with [place] as directed to closely monitor her progress.  3. Class 3 severe obesity with serious comorbidity and body mass index (BMI) of 45.0 to 49.9 in adult, unspecified obesity type (HCC) [name] is currently in the action stage of change. As such, her goal is to continue with weight loss efforts. She has agreed to the Category 3 Plan or practicing portion control and making smarter food choices, such as increasing vegetables and decreasing simple carbohydrates.   Exercise goals: As is.  Behavioral modification strategies: increasing lean protein intake and decreasing simple carbohydrates.  [name] has agreed to follow-up with our clinic in 2 to 3 weeks. She was informed of the importance of frequent follow-up visits to maximize her success with intensive lifestyle modifications for her multiple health conditions.   Objective:   Blood pressure 116/74, pulse 81, temperature 98 F (36.7 C), temperature source Oral, height 5\' 2"  (1.575 m), weight 246 lb (111.6 kg), SpO2 96 %. Body mass index is 44.99 kg/m.  General: Cooperative, alert, well developed, in no acute distress. HEENT: Conjunctivae and lids unremarkable. Cardiovascular: Regular rhythm.  Lungs: Normal work of breathing. Neurologic: No focal deficits.   Lab Results  Component Value Date   CREATININE 0.73 01/17/2020   BUN 19 01/17/2020   NA 140 01/17/2020   K 3.9 01/17/2020   CL 102 01/17/2020  CO2 24 01/17/2020   Lab Results  Component Value Date   ALT 16 01/17/2020   AST 22 01/17/2020   ALKPHOS 68 01/17/2020   BILITOT 0.4 01/17/2020   Lab Results  Component Value Date   HGBA1C 5.4 07/13/2020   HGBA1C 5.5 01/17/2020   Lab Results  Component Value Date   INSULIN 7.3 07/13/2020   INSULIN 9.3 01/17/2020   Lab Results  Component Value Date   TSH 1.360  01/17/2020   Lab Results  Component Value Date   CHOL 239 (H) 01/17/2020   HDL 51 01/17/2020   LDLCALC 167 (H) 01/17/2020   TRIG 119 01/17/2020   Lab Results  Component Value Date   WBC 4.7 12/17/2013   HGB 14.0 12/17/2013   HCT 41.4 12/17/2013   MCV 87.5 12/17/2013   PLT 194 12/17/2013   No results found for: IRON, TIBC, FERRITIN  Attestation Statements:   Reviewed by clinician on day of visit: allergies, medications, problem list, medical history, surgical history, family history, social history, and previous encounter notes.   Trude Mcburney, am acting as Energy manager for [place], FNP-C.  I have reviewed the above documentation for accuracy and completeness, and I agree with the above. -  Jesse Sans, FNP

## 2020-09-12 ENCOUNTER — Ambulatory Visit (INDEPENDENT_AMBULATORY_CARE_PROVIDER_SITE_OTHER): Payer: 59 | Admitting: Family Medicine

## 2020-09-14 ENCOUNTER — Ambulatory Visit (INDEPENDENT_AMBULATORY_CARE_PROVIDER_SITE_OTHER): Payer: 59 | Admitting: Family Medicine

## 2020-10-10 ENCOUNTER — Other Ambulatory Visit: Payer: Self-pay | Admitting: Neurosurgery

## 2020-10-30 NOTE — Progress Notes (Signed)
Your procedure is scheduled on Thursday, November 02, 2020.  Report to [facility] Main Entrance "A" at 11:00 A.M., and check in at the Admitting office.  Call this number if you have problems the morning of surgery:  [phone]  Call [phone] if you have any questions prior to your surgery date Monday-Friday 8am-4pm    Remember:  Do not eat or drink after midnight the night before your surgery   Take these medicines the morning of surgery with A SIP OF WATER:  acetaminophen (TYLENOL) gabapentin (NEURONTIN) modafinil (PROVIGIL) Teriflunomide (AUBAGIO)   As of today, STOP taking any Aspirin (unless otherwise instructed by your surgeon) Aleve, Naproxen, Ibuprofen, Motrin, Advil, Goody's, BC's, all herbal medications, fish oil, and all vitamins.                      Do not wear jewelry, make up, or nail polish            Do not wear lotions, powders, perfumes, or deodorant.            Do not shave 48 hours prior to surgery.            Do not bring valuables to the hospital.            [facility] is not responsible for any belongings or valuables.  Do NOT Smoke (Tobacco/Vaping) or drink Alcohol 24 hours prior to your procedure If you use a CPAP at night, you may bring all equipment for your overnight stay.   Contacts, glasses, dentures or bridgework may not be worn into surgery.      For patients admitted to the hospital, discharge time will be determined by your treatment team.   Patients discharged the day of surgery will not be allowed to drive home, and someone needs to stay with them for 24 hours.    Special instructions:   - Preparing For Surgery  Before surgery, you can play an important role. Because skin is not sterile, your skin needs to be as free of germs as possible. You can reduce the number of germs on your skin by washing with CHG (chlorahexidine gluconate) Soap before surgery.  CHG is an antiseptic cleaner which kills germs and bonds with the  skin to continue killing germs even after washing.    Oral Hygiene is also important to reduce your risk of infection.  Remember - BRUSH YOUR TEETH THE MORNING OF SURGERY WITH YOUR REGULAR TOOTHPASTE  Please do not use if you have an allergy to CHG or antibacterial soaps. If your skin becomes reddened/irritated stop using the CHG.  Do not shave (including legs and underarms) for at least 48 hours prior to first CHG shower. It is OK to shave your face.  Please follow these instructions carefully.   1. Shower the NIGHT BEFORE SURGERY and the MORNING OF SURGERY with CHG Soap.   2. If you chose to wash your hair, wash your hair first as usual with your normal shampoo.  3. After you shampoo, rinse your hair and body thoroughly to remove the shampoo.  4. Use CHG as you would any other liquid soap. You can apply CHG directly to the skin and wash gently with a scrungie or a clean washcloth.   5. Apply the CHG Soap to your body ONLY FROM THE NECK DOWN.  Do not use on open wounds or open sores. Avoid contact with your eyes, ears, mouth and genitals (private parts). Wash Face  and genitals (private parts)  with your normal soap.   6. Wash thoroughly, paying special attention to the area where your surgery will be performed.  7. Thoroughly rinse your body with warm water from the neck down.  8. DO NOT shower/wash with your normal soap after using and rinsing off the CHG Soap.  9. Pat yourself dry with a CLEAN TOWEL.  10. Wear CLEAN PAJAMAS to bed the night before surgery  11. Place CLEAN SHEETS on your bed the night of your first shower and DO NOT SLEEP WITH PETS.   Day of Surgery: Wear Clean/Comfortable clothing the morning of surgery Do not apply any deodorants/lotions.   Remember to brush your teeth WITH YOUR REGULAR TOOTHPASTE.   Please read over the following fact sheets that you were given.

## 2020-10-31 ENCOUNTER — Encounter (HOSPITAL_COMMUNITY)
Admission: RE | Admit: 2020-10-31 | Discharge: 2020-10-31 | Disposition: A | Discharge status: Home / Self Care | Payer: 59 | Source: Ambulatory Visit | Attending: Neurosurgery | Admitting: Neurosurgery

## 2020-10-31 ENCOUNTER — Other Ambulatory Visit (HOSPITAL_COMMUNITY)
Admission: RE | Admit: 2020-10-31 | Discharge: 2020-10-31 | Disposition: A | Discharge status: Home / Self Care | Payer: 59 | Source: Ambulatory Visit | Attending: Neurosurgery | Admitting: Neurosurgery

## 2020-10-31 ENCOUNTER — Encounter (HOSPITAL_COMMUNITY): Payer: Self-pay

## 2020-10-31 ENCOUNTER — Other Ambulatory Visit: Payer: Self-pay

## 2020-10-31 DIAGNOSIS — Z01812 Encounter for preprocedural laboratory examination: Secondary | ICD-10-CM | POA: Insufficient documentation

## 2020-10-31 DIAGNOSIS — Z20822 Contact with and (suspected) exposure to covid-19: Secondary | ICD-10-CM | POA: Insufficient documentation

## 2020-10-31 HISTORY — DX: Myoneural disorder, unspecified: G70.9

## 2020-10-31 LAB — BASIC METABOLIC PANEL
Anion gap: 10 (ref 5–15)
BUN: 12 mg/dL (ref 8–23)
CO2: 27 mmol/L (ref 22–32)
Calcium: 9.4 mg/dL (ref 8.9–10.3)
Chloride: 103 mmol/L (ref 98–111)
Creatinine, Ser: 0.69 mg/dL (ref 0.44–1.00)
GFR, Estimated: >60 mL/min (ref >60)
Glucose, Bld: 99 mg/dL (ref 70–99)
Potassium: 3.6 mmol/L (ref 3.5–5.1)
Sodium: 140 mmol/L (ref 135–145)

## 2020-10-31 LAB — CBC
HCT: 38.1 % (ref 36.0–46.0)
Hemoglobin: 12 g/dL (ref 12.0–15.0)
MCH: 28.4 pg (ref 26.0–34.0)
MCHC: 31.5 g/dL (ref 30.0–36.0)
MCV: 90.3 fL (ref 80.0–100.0)
Platelets: 246 10*3/uL (ref 150–400)
RBC: 4.22 MIL/uL (ref 3.87–5.11)
RDW: 13.1 % (ref 11.5–15.5)
WBC: 5.2 10*3/uL (ref 4.0–10.5)
nRBC: 0 % (ref 0.0–0.2)

## 2020-10-31 LAB — TYPE AND SCREEN
ABO/RH(D): A POS
Antibody Screen: NEGATIVE

## 2020-10-31 LAB — SURGICAL PCR SCREEN
MRSA, PCR: NEGATIVE
Staphylococcus aureus: NEGATIVE

## 2020-10-31 LAB — SARS CORONAVIRUS 2 (TAT 6-24 HRS): SARS Coronavirus 2: NEGATIVE

## 2020-10-31 NOTE — Progress Notes (Signed)
PCP - Dr. Jonny Ruiz Card Cardiologist -  Neurologist: Dr. Marlena Clipper  PPM/ICD - Denies  Chest x-ray - N/A EKG - 01/17/20 Stress Test - Denies ECHO - Denies Cardiac Cath - Denies  Sleep Study - Denies  Patient denies having diabetes.  Blood Thinner Instructions: N/A Aspirin Instructions: N/A  ERAS Protcol - No  COVID TEST- 10/31/20   Anesthesia review: Yes, hx of MS  Patient denies shortness of breath, fever, cough and chest pain at PAT appointment   All instructions explained to the patient, with a verbal understanding of the material. Patient agrees to go over the instructions while at home for a better understanding. Patient also instructed to self quarantine after being tested for COVID-19. The opportunity to ask questions was provided.

## 2020-11-02 ENCOUNTER — Inpatient Hospital Stay (HOSPITAL_COMMUNITY): Payer: 59

## 2020-11-02 ENCOUNTER — Inpatient Hospital Stay (HOSPITAL_COMMUNITY): Payer: 59 | Admitting: Certified Registered Nurse Anesthetist

## 2020-11-02 ENCOUNTER — Inpatient Hospital Stay (HOSPITAL_COMMUNITY): Payer: 59 | Admitting: Physician Assistant

## 2020-11-02 ENCOUNTER — Other Ambulatory Visit: Payer: Self-pay

## 2020-11-02 ENCOUNTER — Inpatient Hospital Stay (HOSPITAL_COMMUNITY)
Admission: RE | Admit: 2020-11-02 | Discharge: 2020-11-03 | DRG: 454 | Disposition: A | Discharge status: Home Health | Payer: 59 | Attending: Neurosurgery | Admitting: Neurosurgery

## 2020-11-02 ENCOUNTER — Encounter (HOSPITAL_COMMUNITY)
Admission: RE | Disposition: A | Discharge status: Home Health | Payer: Self-pay | Source: Home / Self Care | Attending: Neurosurgery

## 2020-11-02 DIAGNOSIS — Z801 Family history of malignant neoplasm of trachea, bronchus and lung: Secondary | ICD-10-CM | POA: Diagnosis not present

## 2020-11-02 DIAGNOSIS — M48062 Spinal stenosis, lumbar region with neurogenic claudication: Secondary | ICD-10-CM | POA: Diagnosis present

## 2020-11-02 DIAGNOSIS — G35 Multiple sclerosis: Secondary | ICD-10-CM | POA: Diagnosis present

## 2020-11-02 DIAGNOSIS — Z20822 Contact with and (suspected) exposure to covid-19: Secondary | ICD-10-CM | POA: Diagnosis present

## 2020-11-02 DIAGNOSIS — E669 Obesity, unspecified: Secondary | ICD-10-CM | POA: Diagnosis present

## 2020-11-02 DIAGNOSIS — Z803 Family history of malignant neoplasm of breast: Secondary | ICD-10-CM

## 2020-11-02 DIAGNOSIS — Z8249 Family history of ischemic heart disease and other diseases of the circulatory system: Secondary | ICD-10-CM

## 2020-11-02 DIAGNOSIS — Z6841 Body Mass Index (BMI) 40.0 and over, adult: Secondary | ICD-10-CM | POA: Diagnosis not present

## 2020-11-02 DIAGNOSIS — M4316 Spondylolisthesis, lumbar region: Secondary | ICD-10-CM | POA: Diagnosis present

## 2020-11-02 DIAGNOSIS — Z419 Encounter for procedure for purposes other than remedying health state, unspecified: Secondary | ICD-10-CM

## 2020-11-02 LAB — ABO/RH: ABO/RH(D): A POS

## 2020-11-02 IMAGING — RF DG LUMBAR SPINE 2-3V
1 series · 2 of 2 positions shown · non-contrast
Comparison: Lumbar spine MRI [DATE].

CLINICAL DATA: Elective surgery. Additional history provided:
Posterior lumbar fusion level 4-5. Provided fluoroscopy time 1
minutes, 34 seconds (128.4 mGy).

EXAM:
LUMBAR SPINE - 2-3 VIEW; DG C-ARM 1-60 MIN

[Series 1: run · 2 of 2 slices shown]
[im 1/2]
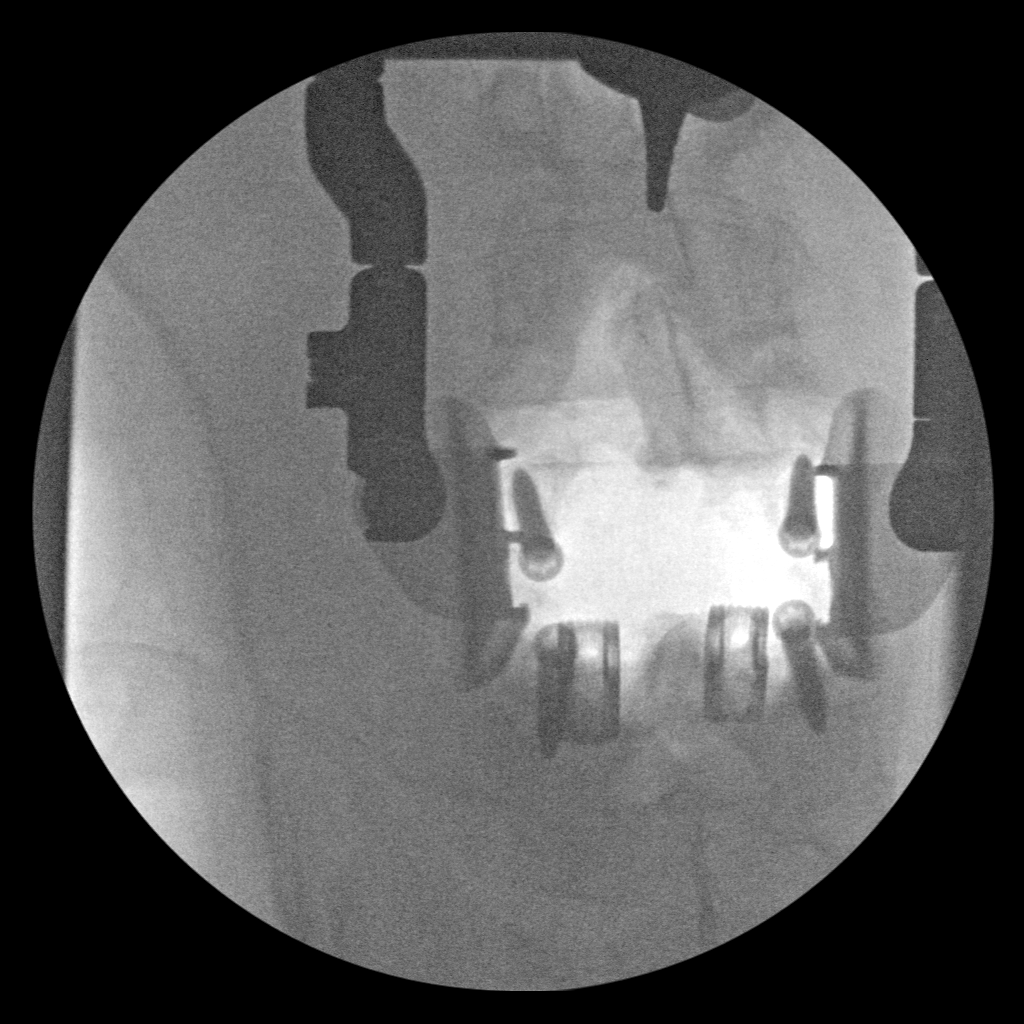
[im 2/2]
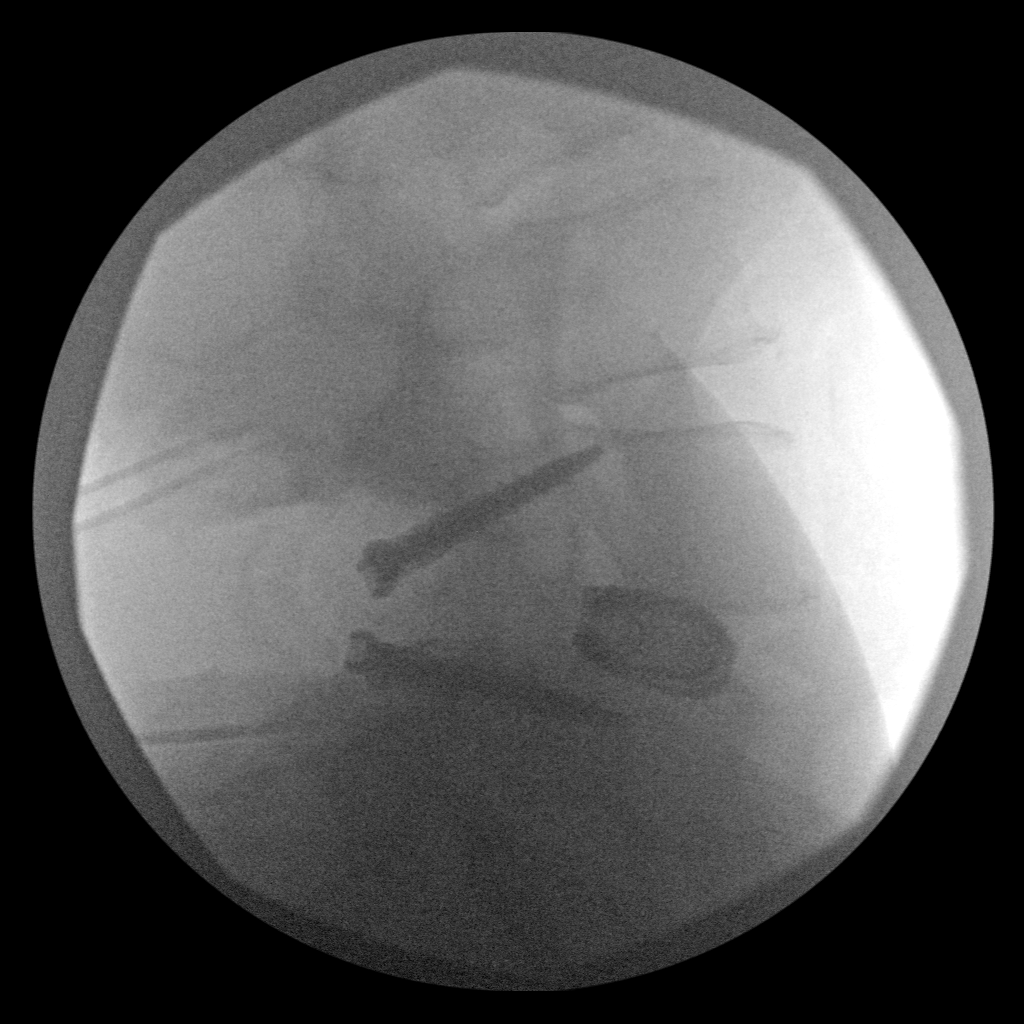

[2 of 2 positions shown; findings below may reference images not displayed]

FINDINGS: PA and lateral view intraoperative fluoroscopic images of the lumbar
spine are submitted, two images total. The images demonstrate
bilateral pedicle screws at what are presumed to be the L4-L5 levels
(the exact level is difficult to ascertain on the provided images).
Vertical interconnecting rods were not present at the time the
images were taken. An interbody spacer is also present at this
level. Overlying retractors.
IMPRESSION: Two intraoperative fluoroscopic images of the lumbar spine, as
described.

## 2020-11-02 IMAGING — RF DG C-ARM 1-60 MIN
1 series · 2 of 2 positions shown · non-contrast
Comparison: Lumbar spine MRI [DATE].

CLINICAL DATA: Elective surgery. Additional history provided:
Posterior lumbar fusion level 4-5. Provided fluoroscopy time 1
minutes, 34 seconds (128.4 mGy).

EXAM:
LUMBAR SPINE - 2-3 VIEW; DG C-ARM 1-60 MIN

[Series 1: run · 2 of 2 slices shown]
[im 1/2]
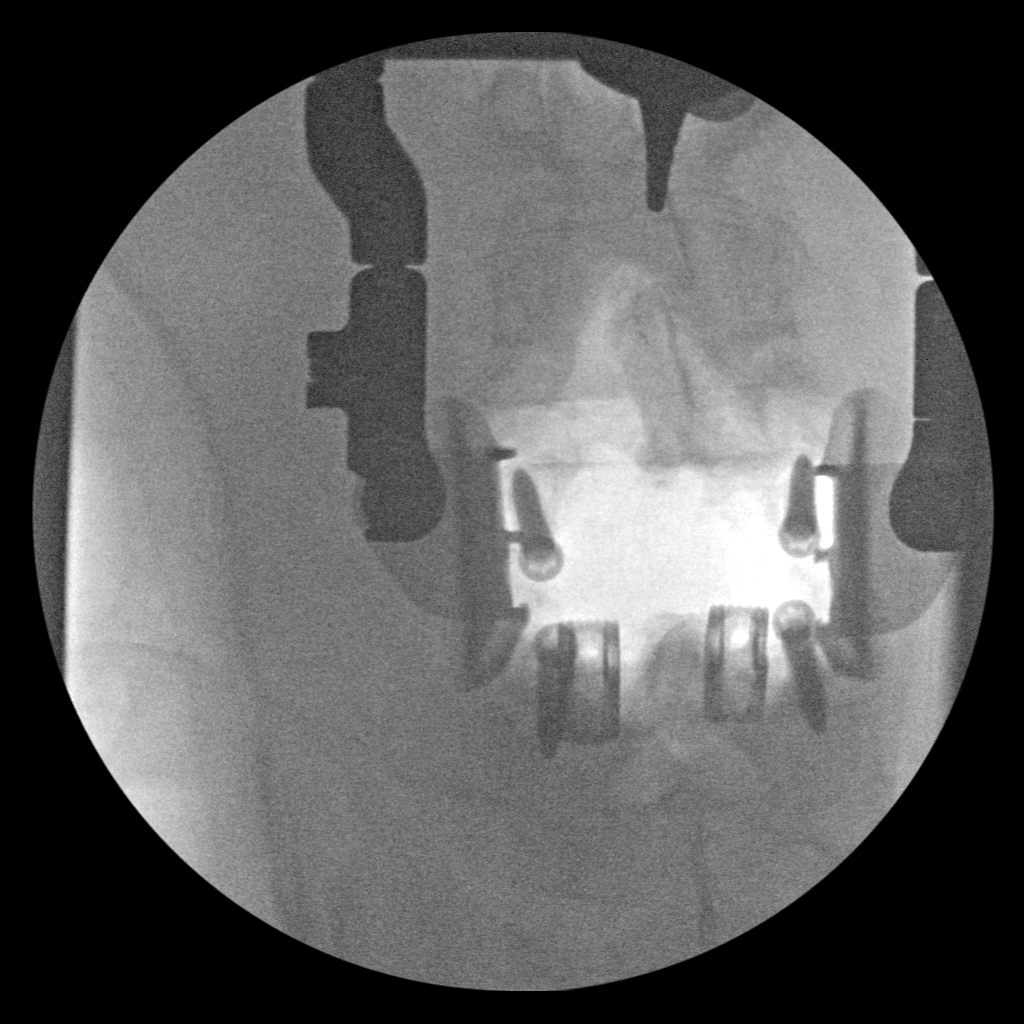
[im 2/2]
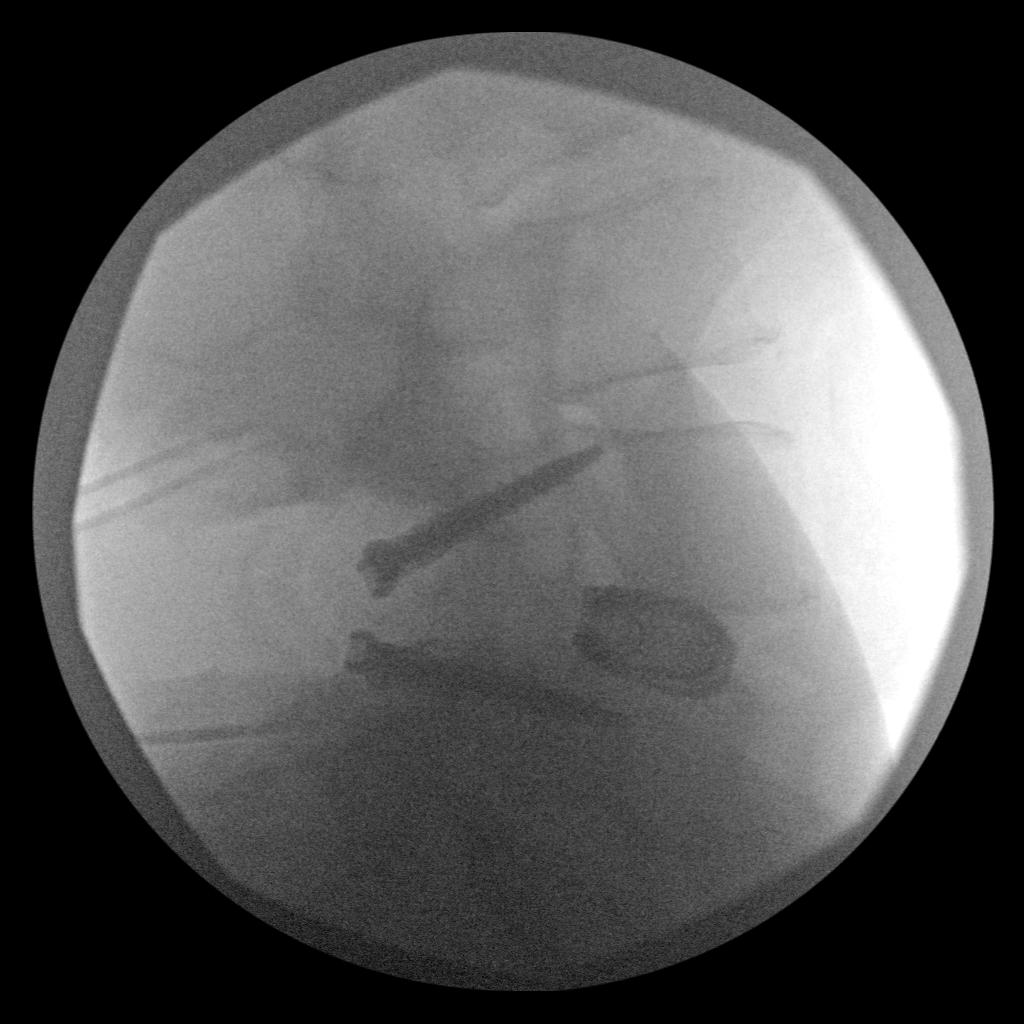

[2 of 2 positions shown; findings below may reference images not displayed]

FINDINGS: PA and lateral view intraoperative fluoroscopic images of the lumbar
spine are submitted, two images total. The images demonstrate
bilateral pedicle screws at what are presumed to be the L4-L5 levels
(the exact level is difficult to ascertain on the provided images).
Vertical interconnecting rods were not present at the time the
images were taken. An interbody spacer is also present at this
level. Overlying retractors.
IMPRESSION: Two intraoperative fluoroscopic images of the lumbar spine, as
described.

## 2020-11-02 SURGERY — POSTERIOR LUMBAR FUSION 1 LEVEL
Anesthesia: General

## 2020-11-02 MED ORDER — LIDOCAINE 2% (20 MG/ML) 5 ML SYRINGE
INTRAMUSCULAR | Status: DC | PRN
  Administered 2020-11-02: 80 mg via INTRAVENOUS

## 2020-11-02 MED ORDER — PHENYLEPHRINE 40 MCG/ML (10ML) SYRINGE FOR IV PUSH (FOR BLOOD PRESSURE SUPPORT)
PREFILLED_SYRINGE | INTRAVENOUS | Status: DC | PRN
  Administered 2020-11-02 (×2): 80 ug via INTRAVENOUS

## 2020-11-02 MED ORDER — CALCIUM CARBONATE-VITAMIN D 500-200 MG-UNIT PO TABS
1.0000 | ORAL_TABLET | Freq: Every day | ORAL | Status: DC
  Administered 2020-11-03: 1 via ORAL
  Filled 2020-11-02: qty 1

## 2020-11-02 MED ORDER — OXYCODONE HCL 5 MG PO TABS: 5.0000 mg | ORAL_TABLET | ORAL | Status: DC | PRN

## 2020-11-02 MED ORDER — DEXAMETHASONE SODIUM PHOSPHATE 10 MG/ML IJ SOLN
INTRAMUSCULAR | Status: AC
  Filled 2020-11-02: qty 1

## 2020-11-02 MED ORDER — ACETAMINOPHEN 10 MG/ML IV SOLN
INTRAVENOUS | Status: AC
  Filled 2020-11-02: qty 100

## 2020-11-02 MED ORDER — SODIUM CHLORIDE 0.9 % IV SOLN: 250.0000 mL | INTRAVENOUS | Status: DC

## 2020-11-02 MED ORDER — ROCURONIUM BROMIDE 10 MG/ML (PF) SYRINGE
PREFILLED_SYRINGE | INTRAVENOUS | Status: AC
  Filled 2020-11-02: qty 10

## 2020-11-02 MED ORDER — MORPHINE SULFATE (PF) 2 MG/ML IV SOLN: 2.0000 mg | INTRAVENOUS | Status: DC | PRN

## 2020-11-02 MED ORDER — HYDROCHLOROTHIAZIDE 25 MG PO TABS
25.0000 mg | ORAL_TABLET | Freq: Every day | ORAL | Status: DC
  Administered 2020-11-03: 25 mg via ORAL
  Filled 2020-11-02 (×2): qty 1

## 2020-11-02 MED ORDER — ONDANSETRON HCL 4 MG/2ML IJ SOLN: 4.0000 mg | Freq: Four times a day (QID) | INTRAMUSCULAR | Status: DC | PRN

## 2020-11-02 MED ORDER — CEFAZOLIN SODIUM-DEXTROSE 2-4 GM/100ML-% IV SOLN
2.0000 g | Freq: Three times a day (TID) | INTRAVENOUS | Status: AC
  Administered 2020-11-02 – 2020-11-03 (×2): 2 g via INTRAVENOUS
  Filled 2020-11-02 (×2): qty 100

## 2020-11-02 MED ORDER — ONDANSETRON HCL 4 MG/2ML IJ SOLN
INTRAMUSCULAR | Status: DC | PRN
  Administered 2020-11-02: 4 mg via INTRAVENOUS

## 2020-11-02 MED ORDER — EPHEDRINE 5 MG/ML INJ
INTRAVENOUS | Status: AC
  Filled 2020-11-02: qty 10

## 2020-11-02 MED ORDER — THROMBIN 20000 UNITS EX SOLR
CUTANEOUS | Status: AC
  Filled 2020-11-02: qty 20000

## 2020-11-02 MED ORDER — ONDANSETRON HCL 4 MG PO TABS: 4.0000 mg | ORAL_TABLET | Freq: Four times a day (QID) | ORAL | Status: DC | PRN

## 2020-11-02 MED ORDER — THROMBIN (RECOMBINANT) 20000 UNITS EX SOLR
CUTANEOUS | Status: AC
  Filled 2020-11-02: qty 20000

## 2020-11-02 MED ORDER — SODIUM CHLORIDE 0.9% FLUSH
3.0000 mL | Freq: Two times a day (BID) | INTRAVENOUS | Status: DC
  Administered 2020-11-02 – 2020-11-03 (×2): 3 mL via INTRAVENOUS

## 2020-11-02 MED ORDER — 0.9 % SODIUM CHLORIDE (POUR BTL) OPTIME
TOPICAL | Status: DC | PRN
  Administered 2020-11-02: 1000 mL

## 2020-11-02 MED ORDER — LIDOCAINE-EPINEPHRINE 0.5 %-1:200000 IJ SOLN
INTRAMUSCULAR | Status: DC | PRN
  Administered 2020-11-02: 28 mL

## 2020-11-02 MED ORDER — ROSUVASTATIN CALCIUM 5 MG PO TABS
10.0000 mg | ORAL_TABLET | Freq: Every day | ORAL | Status: DC
  Administered 2020-11-02: 10 mg via ORAL
  Filled 2020-11-02: qty 2

## 2020-11-02 MED ORDER — CHLORHEXIDINE GLUCONATE CLOTH 2 % EX PADS: 6.0000 | MEDICATED_PAD | Freq: Once | CUTANEOUS | Status: DC

## 2020-11-02 MED ORDER — GLYCOPYRROLATE PF 0.2 MG/ML IJ SOSY
PREFILLED_SYRINGE | INTRAMUSCULAR | Status: AC
  Filled 2020-11-02: qty 1

## 2020-11-02 MED ORDER — SUGAMMADEX SODIUM 200 MG/2ML IV SOLN
INTRAVENOUS | Status: DC | PRN
  Administered 2020-11-02: 250 mg via INTRAVENOUS

## 2020-11-02 MED ORDER — CEFAZOLIN SODIUM-DEXTROSE 2-4 GM/100ML-% IV SOLN
INTRAVENOUS | Status: AC
  Filled 2020-11-02: qty 100

## 2020-11-02 MED ORDER — LIDOCAINE-EPINEPHRINE 0.5 %-1:200000 IJ SOLN
INTRAMUSCULAR | Status: AC
  Filled 2020-11-02: qty 1

## 2020-11-02 MED ORDER — DIPHENHYDRAMINE HCL 50 MG/ML IJ SOLN
INTRAMUSCULAR | Status: AC
  Filled 2020-11-02: qty 1

## 2020-11-02 MED ORDER — FENTANYL CITRATE (PF) 100 MCG/2ML IJ SOLN
25.0000 ug | INTRAMUSCULAR | Status: DC | PRN
  Administered 2020-11-02: 25 ug via INTRAVENOUS
  Administered 2020-11-02: 50 ug via INTRAVENOUS
  Administered 2020-11-02: 25 ug via INTRAVENOUS

## 2020-11-02 MED ORDER — ROCURONIUM BROMIDE 10 MG/ML (PF) SYRINGE
PREFILLED_SYRINGE | INTRAVENOUS | Status: DC | PRN
  Administered 2020-11-02: 60 mg via INTRAVENOUS
  Administered 2020-11-02: 40 mg via INTRAVENOUS

## 2020-11-02 MED ORDER — FENTANYL CITRATE (PF) 100 MCG/2ML IJ SOLN
INTRAMUSCULAR | Status: AC
  Filled 2020-11-02: qty 2

## 2020-11-02 MED ORDER — PROMETHAZINE HCL 25 MG/ML IJ SOLN: 6.2500 mg | INTRAMUSCULAR | Status: DC | PRN

## 2020-11-02 MED ORDER — PROPOFOL 10 MG/ML IV BOLUS
INTRAVENOUS | Status: AC
  Filled 2020-11-02: qty 20

## 2020-11-02 MED ORDER — OXYCODONE HCL 5 MG PO TABS
10.0000 mg | ORAL_TABLET | ORAL | Status: DC | PRN
  Administered 2020-11-03 (×4): 10 mg via ORAL
  Filled 2020-11-02 (×4): qty 2

## 2020-11-02 MED ORDER — POTASSIUM CHLORIDE IN NACL 20-0.9 MEQ/L-% IV SOLN: INTRAVENOUS | Status: DC

## 2020-11-02 MED ORDER — LOSARTAN POTASSIUM 50 MG PO TABS
100.0000 mg | ORAL_TABLET | Freq: Every day | ORAL | Status: DC
  Administered 2020-11-02 – 2020-11-03 (×2): 100 mg via ORAL
  Filled 2020-11-02 (×2): qty 2

## 2020-11-02 MED ORDER — ARTIFICIAL TEARS OPHTHALMIC OINT
TOPICAL_OINTMENT | OPHTHALMIC | Status: AC
  Filled 2020-11-02: qty 3.5

## 2020-11-02 MED ORDER — ALBUMIN HUMAN 5 % IV SOLN: INTRAVENOUS | Status: DC | PRN

## 2020-11-02 MED ORDER — DEXAMETHASONE SODIUM PHOSPHATE 10 MG/ML IJ SOLN
INTRAMUSCULAR | Status: DC | PRN
  Administered 2020-11-02: 10 mg via INTRAVENOUS

## 2020-11-02 MED ORDER — BISACODYL 5 MG PO TBEC: 5.0000 mg | DELAYED_RELEASE_TABLET | Freq: Every day | ORAL | Status: DC | PRN

## 2020-11-02 MED ORDER — DIPHENHYDRAMINE HCL 50 MG/ML IJ SOLN
INTRAMUSCULAR | Status: DC | PRN
  Administered 2020-11-02: 12.5 mg via INTRAVENOUS

## 2020-11-02 MED ORDER — MENTHOL 3 MG MT LOZG: 1.0000 | LOZENGE | OROMUCOSAL | Status: DC | PRN

## 2020-11-02 MED ORDER — ACETAMINOPHEN 500 MG PO TABS
1000.0000 mg | ORAL_TABLET | Freq: Four times a day (QID) | ORAL | Status: DC
  Administered 2020-11-02 – 2020-11-03 (×3): 1000 mg via ORAL
  Filled 2020-11-02 (×3): qty 2

## 2020-11-02 MED ORDER — CEFAZOLIN SODIUM-DEXTROSE 2-4 GM/100ML-% IV SOLN
2.0000 g | INTRAVENOUS | Status: AC
  Administered 2020-11-02: 2 g via INTRAVENOUS

## 2020-11-02 MED ORDER — ARTIFICIAL TEARS OPHTHALMIC OINT
TOPICAL_OINTMENT | OPHTHALMIC | Status: DC | PRN
  Administered 2020-11-02: 1 via OPHTHALMIC

## 2020-11-02 MED ORDER — CHLORHEXIDINE GLUCONATE 0.12 % MT SOLN: 15.0000 mL | Freq: Once | OROMUCOSAL | Status: AC

## 2020-11-02 MED ORDER — TERIFLUNOMIDE 14 MG PO TABS: 14.0000 mg | ORAL_TABLET | Freq: Every day | ORAL | Status: DC

## 2020-11-02 MED ORDER — MIDAZOLAM HCL 5 MG/5ML IJ SOLN
INTRAMUSCULAR | Status: DC | PRN
  Administered 2020-11-02: 2 mg via INTRAVENOUS

## 2020-11-02 MED ORDER — ADULT MULTIVITAMIN W/MINERALS CH
1.0000 | ORAL_TABLET | Freq: Every day | ORAL | Status: DC
  Administered 2020-11-03: 1 via ORAL
  Filled 2020-11-02: qty 1

## 2020-11-02 MED ORDER — THROMBIN 20000 UNITS EX SOLR
CUTANEOUS | Status: DC | PRN
  Administered 2020-11-02: 20 mL via TOPICAL

## 2020-11-02 MED ORDER — ACETAMINOPHEN 325 MG PO TABS: 650.0000 mg | ORAL_TABLET | ORAL | Status: DC | PRN

## 2020-11-02 MED ORDER — LIDOCAINE 2% (20 MG/ML) 5 ML SYRINGE
INTRAMUSCULAR | Status: AC
  Filled 2020-11-02: qty 5

## 2020-11-02 MED ORDER — PROPOFOL 10 MG/ML IV BOLUS
INTRAVENOUS | Status: DC | PRN
  Administered 2020-11-02: 130 mg via INTRAVENOUS

## 2020-11-02 MED ORDER — MAGNESIUM CITRATE PO SOLN: 1.0000 | Freq: Once | ORAL | Status: DC | PRN

## 2020-11-02 MED ORDER — OXYCODONE HCL ER 15 MG PO T12A
15.0000 mg | EXTENDED_RELEASE_TABLET | Freq: Two times a day (BID) | ORAL | Status: DC
  Administered 2020-11-02 – 2020-11-03 (×2): 15 mg via ORAL
  Filled 2020-11-02 (×2): qty 1

## 2020-11-02 MED ORDER — BUPIVACAINE HCL (PF) 0.5 % IJ SOLN
INTRAMUSCULAR | Status: AC
  Filled 2020-11-02: qty 30

## 2020-11-02 MED ORDER — PHENOL 1.4 % MT LIQD: 1.0000 | OROMUCOSAL | Status: DC | PRN

## 2020-11-02 MED ORDER — DEXMEDETOMIDINE (PRECEDEX) IN NS 20 MCG/5ML (4 MCG/ML) IV SYRINGE
PREFILLED_SYRINGE | INTRAVENOUS | Status: AC
  Filled 2020-11-02: qty 5

## 2020-11-02 MED ORDER — EPHEDRINE SULFATE-NACL 50-0.9 MG/10ML-% IV SOSY
PREFILLED_SYRINGE | INTRAVENOUS | Status: DC | PRN
  Administered 2020-11-02: 10 mg via INTRAVENOUS

## 2020-11-02 MED ORDER — SODIUM CHLORIDE 0.9% FLUSH: 3.0000 mL | INTRAVENOUS | Status: DC | PRN

## 2020-11-02 MED ORDER — FENTANYL CITRATE (PF) 250 MCG/5ML IJ SOLN
INTRAMUSCULAR | Status: AC
  Filled 2020-11-02: qty 5

## 2020-11-02 MED ORDER — MODAFINIL 100 MG PO TABS: 200.0000 mg | ORAL_TABLET | Freq: Two times a day (BID) | ORAL | Status: DC

## 2020-11-02 MED ORDER — GABAPENTIN 300 MG PO CAPS
300.0000 mg | ORAL_CAPSULE | Freq: Three times a day (TID) | ORAL | Status: DC
  Administered 2020-11-02 – 2020-11-03 (×3): 300 mg via ORAL
  Filled 2020-11-02 (×3): qty 1

## 2020-11-02 MED ORDER — PHENYLEPHRINE HCL-NACL 10-0.9 MG/250ML-% IV SOLN
INTRAVENOUS | Status: DC | PRN
  Administered 2020-11-02: 25 ug/min via INTRAVENOUS

## 2020-11-02 MED ORDER — MODAFINIL 100 MG PO TABS
200.0000 mg | ORAL_TABLET | Freq: Every day | ORAL | Status: DC
  Administered 2020-11-03: 200 mg via ORAL
  Filled 2020-11-02: qty 2

## 2020-11-02 MED ORDER — ACETAMINOPHEN 650 MG RE SUPP: 650.0000 mg | RECTAL | Status: DC | PRN

## 2020-11-02 MED ORDER — ORAL CARE MOUTH RINSE: 15.0000 mL | Freq: Once | OROMUCOSAL | Status: AC

## 2020-11-02 MED ORDER — DOCUSATE SODIUM 100 MG PO CAPS
100.0000 mg | ORAL_CAPSULE | Freq: Two times a day (BID) | ORAL | Status: DC
  Administered 2020-11-02 – 2020-11-03 (×2): 100 mg via ORAL
  Filled 2020-11-02 (×2): qty 1

## 2020-11-02 MED ORDER — GLYCOPYRROLATE PF 0.2 MG/ML IJ SOSY
PREFILLED_SYRINGE | INTRAMUSCULAR | Status: DC | PRN
  Administered 2020-11-02: .2 mg via INTRAVENOUS

## 2020-11-02 MED ORDER — VITAMIN D 25 MCG (1000 UNIT) PO TABS
10000.0000 [IU] | ORAL_TABLET | Freq: Every day | ORAL | Status: DC
  Filled 2020-11-02: qty 10

## 2020-11-02 MED ORDER — LACTATED RINGERS IV SOLN: INTRAVENOUS | Status: DC

## 2020-11-02 MED ORDER — PROPOFOL 500 MG/50ML IV EMUL
INTRAVENOUS | Status: DC | PRN
  Administered 2020-11-02: 50 ug/kg/min via INTRAVENOUS

## 2020-11-02 MED ORDER — ONDANSETRON HCL 4 MG/2ML IJ SOLN
INTRAMUSCULAR | Status: AC
  Filled 2020-11-02: qty 2

## 2020-11-02 MED ORDER — DIAZEPAM 5 MG PO TABS
5.0000 mg | ORAL_TABLET | Freq: Four times a day (QID) | ORAL | Status: DC | PRN
  Administered 2020-11-03: 5 mg via ORAL
  Filled 2020-11-02: qty 1

## 2020-11-02 MED ORDER — CHLORHEXIDINE GLUCONATE 0.12 % MT SOLN
OROMUCOSAL | Status: AC
  Administered 2020-11-02: 15 mL via OROMUCOSAL
  Filled 2020-11-02: qty 15

## 2020-11-02 MED ORDER — SENNOSIDES-DOCUSATE SODIUM 8.6-50 MG PO TABS: 1.0000 | ORAL_TABLET | Freq: Every evening | ORAL | Status: DC | PRN

## 2020-11-02 MED ORDER — FENTANYL CITRATE (PF) 100 MCG/2ML IJ SOLN
INTRAMUSCULAR | Status: DC | PRN
  Administered 2020-11-02: 50 ug via INTRAVENOUS
  Administered 2020-11-02: 100 ug via INTRAVENOUS
  Administered 2020-11-02 (×2): 50 ug via INTRAVENOUS

## 2020-11-02 MED ORDER — PHENYLEPHRINE 40 MCG/ML (10ML) SYRINGE FOR IV PUSH (FOR BLOOD PRESSURE SUPPORT)
PREFILLED_SYRINGE | INTRAVENOUS | Status: AC
  Filled 2020-11-02: qty 10

## 2020-11-02 SURGICAL SUPPLY — 61 items
BASKET BONE COLLECTION (BASKET) ×3 IMPLANT
BENZOIN TINCTURE PRP APPL 2/3 (GAUZE/BANDAGES/DRESSINGS) IMPLANT
BIT DRILL PLIF MAS DISP 5.5MM (DRILL) ×1 IMPLANT
BUR MATCHSTICK NEURO 3.0 LAGG (BURR) ×3 IMPLANT
BUR PRECISION FLUTE 5.0 (BURR) ×3 IMPLANT
CAGE CONVEX CASCADIA 8.5X22X11 (Cage) ×6 IMPLANT
CANISTER SUCT 3000ML PPV (MISCELLANEOUS) ×3 IMPLANT
CAP RELINE MOD TULIP RMM (Cap) ×12 IMPLANT
CARTRIDGE OIL MAESTRO DRILL (MISCELLANEOUS) ×1 IMPLANT
CLOSURE WOUND 1/2 X4 (GAUZE/BANDAGES/DRESSINGS)
CNTNR URN SCR LID CUP LEK RST (MISCELLANEOUS) ×1 IMPLANT
CONT SPEC 4OZ STRL OR WHT (MISCELLANEOUS) ×2
COVER BACK TABLE 60X90IN (DRAPES) ×3 IMPLANT
DECANTER SPIKE VIAL GLASS SM (MISCELLANEOUS) ×3 IMPLANT
DERMABOND ADVANCED (GAUZE/BANDAGES/DRESSINGS) ×2
DERMABOND ADVANCED .7 DNX12 (GAUZE/BANDAGES/DRESSINGS) ×1 IMPLANT
DIFFUSER DRILL AIR PNEUMATIC (MISCELLANEOUS) ×3 IMPLANT
DRAPE C-ARM 42X72 X-RAY (DRAPES) ×6 IMPLANT
DRAPE C-ARMOR (DRAPES) IMPLANT
DRAPE LAPAROTOMY 100X72X124 (DRAPES) ×3 IMPLANT
DRAPE SURG 17X23 STRL (DRAPES) ×3 IMPLANT
DRILL PLIF MAS DISP 5.5MM (DRILL) ×3
DRSG OPSITE POSTOP 4X8 (GAUZE/BANDAGES/DRESSINGS) ×3 IMPLANT
DURAPREP 26ML APPLICATOR (WOUND CARE) ×3 IMPLANT
ELECT BLADE 4.0 EZ CLEAN MEGAD (MISCELLANEOUS) ×3
ELECT REM PT RETURN 9FT ADLT (ELECTROSURGICAL) ×3
ELECTRODE BLDE 4.0 EZ CLN MEGD (MISCELLANEOUS) ×1 IMPLANT
ELECTRODE REM PT RTRN 9FT ADLT (ELECTROSURGICAL) ×1 IMPLANT
GAUZE 4X4 16PLY RFD (DISPOSABLE) IMPLANT
GAUZE SPONGE 4X4 12PLY STRL (GAUZE/BANDAGES/DRESSINGS) IMPLANT
GLOVE ECLIPSE 6.5 STRL STRAW (GLOVE) ×6 IMPLANT
GLOVE EXAM NITRILE XL STR (GLOVE) IMPLANT
GOWN STRL REUS W/ TWL LRG LVL3 (GOWN DISPOSABLE) ×2 IMPLANT
GOWN STRL REUS W/ TWL XL LVL3 (GOWN DISPOSABLE) IMPLANT
GOWN STRL REUS W/TWL 2XL LVL3 (GOWN DISPOSABLE) IMPLANT
GOWN STRL REUS W/TWL LRG LVL3 (GOWN DISPOSABLE) ×4
GOWN STRL REUS W/TWL XL LVL3 (GOWN DISPOSABLE)
KIT BASIN OR (CUSTOM PROCEDURE TRAY) ×3 IMPLANT
KIT POSITION SURG JACKSON T1 (MISCELLANEOUS) ×3 IMPLANT
KIT TURNOVER KIT B (KITS) ×3 IMPLANT
MILL MEDIUM DISP (BLADE) IMPLANT
NEEDLE HYPO 25X1 1.5 SAFETY (NEEDLE) ×3 IMPLANT
NEEDLE SPNL 18GX3.5 QUINCKE PK (NEEDLE) IMPLANT
NS IRRIG 1000ML POUR BTL (IV SOLUTION) ×3 IMPLANT
OIL CARTRIDGE MAESTRO DRILL (MISCELLANEOUS) ×3
PACK LAMINECTOMY NEURO (CUSTOM PROCEDURE TRAY) ×3 IMPLANT
ROD RELINE COCR LORD 5X30 (Rod) ×6 IMPLANT
SCREW LOCK RSS 4.5/5.0MM (Screw) ×12 IMPLANT
SCREW SHANK RELINE MOD 5.5X35 (Screw) ×12 IMPLANT
SPONGE LAP 4X18 RFD (DISPOSABLE) ×3 IMPLANT
SPONGE SURGIFOAM ABS GEL 100 (HEMOSTASIS) ×3 IMPLANT
STRIP CLOSURE SKIN 1/2X4 (GAUZE/BANDAGES/DRESSINGS) IMPLANT
SUT PROLENE 6 0 BV (SUTURE) IMPLANT
SUT VIC AB 0 CT1 18XCR BRD8 (SUTURE) ×1 IMPLANT
SUT VIC AB 0 CT1 8-18 (SUTURE) ×2
SUT VIC AB 2-0 CT1 18 (SUTURE) ×3 IMPLANT
SUT VIC AB 3-0 SH 8-18 (SUTURE) ×3 IMPLANT
TOWEL GREEN STERILE (TOWEL DISPOSABLE) ×3 IMPLANT
TOWEL GREEN STERILE FF (TOWEL DISPOSABLE) ×3 IMPLANT
TRAY FOLEY MTR SLVR 16FR STAT (SET/KITS/TRAYS/PACK) ×3 IMPLANT
WATER STERILE IRR 1000ML POUR (IV SOLUTION) ×3 IMPLANT

## 2020-11-02 NOTE — Transfer of Care (Signed)
Immediate Anesthesia Transfer of Care Note  Patient: [name]  Procedure(s) Performed: Lumbar four-five posterior lumbar interbody fusion (N/A )  Patient Location: PACU  Anesthesia Type:General  Level of Consciousness: awake, alert  and oriented  Airway & Oxygen Therapy: Patient Spontanous Breathing and Patient connected to face mask oxygen  Post-op Assessment: Report given to RN and Post -op Vital signs reviewed and stable  Post vital signs: Reviewed and stable  Last Vitals:  Vitals Value Taken Time  BP 112/70 11/02/20 1807  Temp    Pulse 82 11/02/20 1810  Resp 16 11/02/20 1810  SpO2 97 % 11/02/20 1810  Vitals shown include unvalidated device data.  Last Pain:  Vitals:   11/02/20 1145  TempSrc:   PainSc: 10-Worst pain ever      Patients Stated Pain Goal: 2 (11/02/20 1145)  Complications: No complications documented.

## 2020-11-02 NOTE — Anesthesia Procedure Notes (Signed)
Procedure Name: Intubation Date/Time: 11/02/2020 1:12 PM Performed by: Alain Marion, CRNA Pre-anesthesia Checklist: Patient identified, Emergency Drugs available, Suction available and Patient being monitored Patient Re-evaluated:Patient Re-evaluated prior to induction Oxygen Delivery Method: Circle System Utilized Preoxygenation: Pre-oxygenation with 100% oxygen Induction Type: IV induction Ventilation: Mask ventilation without difficulty and Oral airway inserted - appropriate to patient size Laryngoscope Size: Glidescope and 3 Grade View: Grade I Tube type: Oral Tube size: 6.5 mm Number of attempts: 1 Airway Equipment and Method: Stylet,  Oral airway and Video-laryngoscopy Placement Confirmation: ETT inserted through vocal cords under direct vision,  positive ETCO2 and breath sounds checked- equal and bilateral Secured at: 22 cm Tube secured with: Tape Dental Injury: Teeth and Oropharynx as per pre-operative assessment  Comments: DVL x 2 with Sabra Heck 2 @ Mac 3 by CRNA and MDA.  No view of cords or posterior airway anatomy, Glide 3 x 1 attempt with grade 1 view.

## 2020-11-02 NOTE — H&P (Signed)
BP (!) 148/90   Pulse 98   Temp 97.6 F (36.4 C) (Oral)   Resp 18   Ht _0  (1.6 m)   Wt 114.9 kg   SpO2 99%   BMI 44.87 kg/m     [name] is a relative of a [name] whom works with [place] in the office.  I actually met her at a housewarming party for [facility] and at that time, she knew she had lumbar spine problems and decided she would come to see me because things had not worked with conservative means.  She has a history of multiple sclerosis and has been dealing with fairly severe pain in her back and lower extremities.  The pain is far worse when she stands or walks and today, she comes in and weighs 256 pounds.  Temperature is 97, blood pressure is 122/79, pulse 91. Pain is 7/10.  She has had epidural steroid injections.  She's had physical therapy.  None of these conservative means have contributed to decrease her pain.  [name] has started to use a cane also because she has very painful knees, but mostly  because of her back pain.  She walks in a stooped fashion to relieve herself of some of the back pain.  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  There was no injury or trauma.  She has undergone a cesarean section by 2.  She has had a tonsillectomy, appendectomy, rotator cuff surgery.  She takes Hydrochlorothiazide, Losartan, Metformin, Rosuvastatin, Nabumetone, Gabapentin, Modafinil, and Aubagio.  She has seasonal hay fever.  No allergy to tape and latex.     REVIEW OF SYSTEMS :  Positive for seasonal allergies, anxiety, arm and leg pain, numbness and tingling.  She does have a history of multiple sclerosis.  She has a history of cancer in the family of breast and lung cancer, that being her mom.  Hypertension and hypercholesterol are also present.     She does feel weakness in the lower extremities.  No bowel or bladder disfunction.     EXAM :  [name] is obese.  She has 5/5 strength in the upper and lower extremities.  She has normal muscle tone and bulk.   Reflexes are 2+ at the knees, 1+ at the ankles, 2+ in the upper extremities.  Romberg is negative.  Gait is antalgic.  She does lean using her cane.  Memory, language, attention span, and fund of knowledge are normal.  Symmetric facies and facial movement. Hearing intact to voice.  Pupils equal, round, and reactive to light.  She has intact proprioception and intact light touch.     IMAGING :  MRI lumbar spine is reviewed showing significant facet arthropathy at L4-5, spondylolisthesis L4 on 5, and fairly severe spinal stenosis.  The conus is normal.  The cauda equina is otherwise normal.  No other abnormalities noted in the soft tissue or paraspinous soft tissue.     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  Given the fact that [name] has undergone a full conservative treatment regimen, I do believe at this time that it makes sense to offer her lumbar decompression and arthrodesis in order to help with the problem she has.  She will speak to her son and family again. I spoke to him by phone today and she had sent pictures of the spine, the MRI to him also.  I simply await her decision.

## 2020-11-02 NOTE — Op Note (Signed)
11/02/2020  6:14 PM  PATIENT:  [name]  59 y.o. female with severe lumbar stenosis and spondylolisthesis L4/5  PRE-OPERATIVE DIAGNOSIS:  Lumbar stenosis with neurogenic claudication, LumbaR spondylolisthesis L4/5  POST-OPERATIVE DIAGNOSIS:  Lumbar stenosis with neurogenic claudication Lumbar spondylolisthesis L4/5 PROCEDURE:  Procedure(s): Lumbar four-five posterior lumbar interbody fusion  SURGEON:  Surgeon(s): Coletta Memos, MD  ASSISTANTS:none  ANESTHESIA:   general  EBL:  Total I/O In: 1600 [I.V.:1000; IV Piggyback:600] Out: 1150 [Urine:650; Blood:500]  BLOOD ADMINISTERED:none  CELL SAVER GIVEN:none  COUNT:per nursing  DRAINS: none   SPECIMEN:  No Specimen  DICTATION: [name] is a 59 y.o. female whom was taken to the operating room intubated, and placed under a general anesthetic without difficulty. A foley catheter was placed under sterile conditions. She was positioned prone on a Jackson table with all pressure points properly padded.  Her lumbar region was prepped and draped in a sterile manner. I infiltrated 10cc's 1/2%lidocaine/1:2000,000 strength epinephrine into the planned incision. I opened the skin with a 10 blade and took the incision down to the thoracolumbar fascia. I exposed the lamina of L4, and L5 in a subperiosteal fashion bilaterally. I confirmed my location with an intraoperative xray.  I placed self retaining retractors and started the decompression.  I decompressed the spinal canal via a complete laminectomy of L4. I used the drill and the Kerrison punches to remove the bone. I removed the hypertrophied ligamentum flavum with the punches. The thecal sac and spinal canal were decompressed with these maneuvers. I unroofed the lateral recesses and the neural formainae bilaterally. This allowed for decompression of the L4 roots and the L5 roots.  PLIF's were performed at L4/5 in the same fashion. I opened the disc space with a 15 blade then  used a variety of instruments to remove the disc and prepare the space for the arthrodesis. I used curettes, rongeurs, punches, shavers for the disc space, and rasps in the discetomy. I measured the disc space and placed 31mm x 36mm  Titanium cages(Stryker) packed with autograft morsels, into the disc space(s).   I placed pedicle screws at L4 and L5, using fluoroscopic guidance. I drilled a pilot hole, then cannulated the pedicle with a drill at each site. I then tapped each pedicle, assessing each site for pedicle violations. No cutouts were appreciated. Screws (Nuvasive relign) were then placed at each site without difficulty. I attached rods and locking caps with the appropriate tools. The locking caps were secured with torque limited screwdrivers. Final films were performed and the final construct appeared to be in good position.  I closed the wound in a layered fashion. I approximated the thoracolumbar fascia, subcutaneous, and subcuticular planes with vicryl sutures. I used Dermabond, and an occlusive bandage for a sterile dressing.     PLAN OF CARE: Admit to inpatient   PATIENT DISPOSITION:  PACU - hemodynamically stable.   Delay start of Pharmacological VTE agent (>24hrs) due to surgical blood loss or risk of bleeding:  yes

## 2020-11-02 NOTE — Anesthesia Postprocedure Evaluation (Signed)
Anesthesia Post Note  Patient: [name]  Procedure(s) Performed: Lumbar four-five posterior lumbar interbody fusion (N/A )     Patient location during evaluation: PACU Anesthesia Type: General Level of consciousness: awake and alert Pain management: pain level controlled Vital Signs Assessment: post-procedure vital signs reviewed and stable Respiratory status: spontaneous breathing, nonlabored ventilation, respiratory function stable and patient connected to nasal cannula oxygen Cardiovascular status: blood pressure returned to baseline and stable Postop Assessment: no apparent nausea or vomiting Anesthetic complications: no   No complications documented.  Last Vitals:  Vitals:   11/02/20 1910 11/02/20 1943  BP: 132/76 (!) 139/98  Pulse: 76 79  Resp: 13 18  Temp: 36.5 C 36.7 C  SpO2: 94% 96%    Last Pain:  Vitals:   11/02/20 1950  TempSrc:   PainSc: 3                  Arthor Gorter COKER

## 2020-11-02 NOTE — Anesthesia Preprocedure Evaluation (Addendum)
Anesthesia Evaluation  Patient identified by MRN, date of birth, ID band Patient awake    Reviewed: Allergy & Precautions, NPO status , Patient's Chart, lab work & pertinent test results  Airway Mallampati: II  TM Distance: >3 FB Neck ROM: Full    Dental  (+) Teeth Intact, Chipped,    Pulmonary former smoker,    Pulmonary exam normal breath sounds clear to auscultation       Cardiovascular hypertension, Pt. on medications Normal cardiovascular exam Rhythm:Regular Rate:Normal     Neuro/Psych Multiple sclerosis   Neuromuscular disease negative psych ROS   GI/Hepatic negative GI ROS, Neg liver ROS,   Endo/Other  diabetes, Type 2, Oral Hypoglycemic AgentsMorbid obesity  Renal/GU negative Renal ROS     Musculoskeletal  (+) Arthritis ,   Abdominal   Peds  Hematology negative hematology ROS (+)   Anesthesia Other Findings Day of surgery medications reviewed with the patient.  Reproductive/Obstetrics                           Anesthesia Physical Anesthesia Plan  ASA: III  Anesthesia Plan: General   Post-op Pain Management:    Induction: Intravenous  PONV Risk Score and Plan: 3 and Midazolam, Dexamethasone and Ondansetron  Airway Management Planned: Oral ETT  Additional Equipment:   Intra-op Plan:   Post-operative Plan: Extubation in OR  Informed Consent: I have reviewed the patients History and Physical, chart, labs and discussed the procedure including the risks, benefits and alternatives for the proposed anesthesia with the patient or authorized representative who has indicated his/her understanding and acceptance.       Plan Discussed with: CRNA  Anesthesia Plan Comments:         Anesthesia Quick Evaluation

## 2020-11-03 MED ORDER — TIZANIDINE HCL 4 MG PO TABS: 4.0000 mg | ORAL_TABLET | Freq: Four times a day (QID) | ORAL | 0 refills | Status: DC | PRN

## 2020-11-03 MED ORDER — OXYCODONE HCL 5 MG PO TABS: 5.0000 mg | ORAL_TABLET | ORAL | 0 refills | Status: AC | PRN

## 2020-11-03 NOTE — Evaluation (Signed)
Occupational Therapy Evaluation Patient Details Name: [name] MRN: [id number] DOB: 10-09-1961 Today's Date: 11/03/2020    History of Present Illness 59 y.o. female with PMH of MS, back pain, appendectomy, rotator cuff repair, HTN, HLD. Pt reports a history of chronic back pain affecting her ability to mobilize and has failed conservative treatment. MRI demonstrates  significant facet arthropathy at L4-5, spondylolisthesis L4 on 5, and fairly severe spinal stenosis. Pt underwent lumbar decompression and arthrodesis on 11/02/2020.   Clinical Impression   PTA, pt lives alone and reports Modified Independence with ADLs, IADLs and mobility using cane. Pt works at a call center. Pt noted with improved upright posture and pain more controlled prior to surgery. Pt with mild deficits in standing balance without use of AD. Time spent educating pt on spinal precautions during ADLs, use of AE as pt unable to bring LEs into figure four position with good carryover noted. Problem solved strategies for IADLs as well. Pt overall Supervision for ADLs and short mobility without AD, min guard for longer distance mobility without AD due to unsteadiness. Pt reports her daughter will stay with her this weekend to assist. Pt plans to have her daughter assist in purchasing reacher, sock aide, bed rail, and possibly tub bench. Recommend HHOT follow-up and BSC to place over toilet to increase ease of transfers.     Follow Up Recommendations  Home health OT    Equipment Recommendations  3 in 1 bedside commode;Tub/shower bench;Other (comment) (RW; pt may acquire tub bench with family assist)    Recommendations for Other Services       Precautions / Restrictions Precautions Precautions: Fall;Back Precaution Booklet Issued: Yes (comment) Precaution Comments: no brace Restrictions Weight Bearing Restrictions: No      Mobility Bed Mobility               General bed mobility comments: received sitting  EOB    Transfers Overall transfer level: Needs assistance Equipment used: None Transfers: Sit to/from Stand Sit to Stand: Supervision         General transfer comment: Supervision for sit to stand without AD. Mild unsteadiness noted but much improved posture from baseline    Balance Overall balance assessment: Needs assistance Sitting-balance support: No upper extremity supported;Feet supported Sitting balance-Leahy Scale: Good     Standing balance support: No upper extremity supported;During functional activity Standing balance-Leahy Scale: Fair Standing balance comment: fair+ static standing, mild unsteadiness during dynamic tasks without UE support                           ADL either performed or assessed with clinical judgement   ADL Overall ADL's : Needs assistance/impaired Eating/Feeding: Independent;Sitting   Grooming: Modified independent;Standing Grooming Details (indicate cue type and reason): After education on grooming standing with back precautions Upper Body Bathing: Supervision/ safety;Sitting   Lower Body Bathing: Sit to/from stand;Supervison/ safety   Upper Body Dressing : Sitting;Set up   Lower Body Dressing: Supervision/safety;Sit to/from stand;Cueing for back precautions;With adaptive equipment Lower Body Dressing Details (indicate cue type and reason): Initially supervision for cueing/education of AE (reacher and sock aide) for LB dressing. Good carryover Toilet Transfer: Supervision/safety;Ambulation;Regular Teacher, adult education Details (indicate cue type and reason): simulated in room with mild unsteadiness without AD noted Toileting- Clothing Manipulation and Hygiene: Supervision/safety;Sit to/from stand       Functional mobility during ADLs: Min guard General ADL Comments: pt with mild limitations in standing balance and pain  Vision Patient Visual Report: No change from baseline Vision Assessment?: No apparent visual  deficits     Perception     Praxis      Pertinent Vitals/Pain Pain Assessment: Faces Faces Pain Scale: Hurts little more Pain Location: low back at surgical site Pain Descriptors / Indicators: Discomfort;Grimacing Pain Intervention(s): Limited activity within patient's tolerance;Monitored during session;Premedicated before session     Hand Dominance Right   Extremity/Trunk Assessment Upper Extremity Assessment Upper Extremity Assessment: Overall WFL for tasks assessed   Lower Extremity Assessment Lower Extremity Assessment: Defer to PT evaluation   Cervical / Trunk Assessment Cervical / Trunk Assessment: Normal   Communication Communication Communication: No difficulties   Cognition Arousal/Alertness: Awake/alert Behavior During Therapy: WFL for tasks assessed/performed Overall Cognitive Status: Within Functional Limits for tasks assessed                                     General Comments  Provided back precautions handout, educated on AE and DME that may be useful, home setup    Exercises     Shoulder Instructions      Home Living Family/patient expects to be discharged to:: Private residence Living Arrangements: Alone Available Help at Discharge: Family Type of Home: House Home Access: Stairs to enter Secretary/administrator of Steps: 3 Entrance Stairs-Rails: Right;Left Home Layout: One level     Bathroom Shower/Tub: Chief Strategy Officer: Standard (standard in spare bedroom, handicapped height in Child psychotherapist ) Bathroom Accessibility: Yes How Accessible: Accessible via walker Home Equipment: Cane - single point   Additional Comments: Daughter plans to stay with pt to assist until Monday. 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          OT Problem List: Decreased strength;Decreased activity tolerance;Impaired balance (sitting and/or standing);Decreased knowledge of use of DME or AE      OT Treatment/Interventions: Self-care/ADL training;Therapeutic exercise;DME and/or AE instruction;Therapeutic activities;Patient/family education;Balance training    OT Goals(Current goals can be found in the care plan section) Acute Rehab OT Goals Patient Stated Goal: return to independence and be safe at home OT Goal Formulation: With patient Time For Goal Achievement: 11/17/20 Potential to Achieve Goals: Good ADL Goals Pt Will Perform Lower Body Bathing: with modified independence;sit to/from stand Pt Will Perform Lower Body Dressing: with modified independence;with adaptive equipment;sitting/lateral leans;sit to/from stand Pt Will Transfer to Toilet: with modified independence;ambulating Pt Will Perform Tub/Shower Transfer: Tub transfer;with modified independence;ambulating;tub bench  OT Frequency: Min 2X/week   Barriers to D/C:            Co-evaluation              AM-PAC OT "6 Clicks" Daily Activity     Outcome Measure Help from another person eating meals?: None Help from another person taking care of personal grooming?: None Help from another person toileting, which includes using toliet, bedpan, or urinal?: A Little Help from another person bathing (including washing, rinsing, drying)?: A Little Help from another person to put on and taking off regular upper body clothing?: A Little Help from another person to put on and taking off regular lower body clothing?: A Little 6 Click Score: 20  End of Session Nurse Communication: Mobility status (pt requests shower)  Activity Tolerance: Patient tolerated treatment well Patient left: in bed;with call bell/phone within reach  OT Visit Diagnosis: Unsteadiness on feet (R26.81);Other abnormalities of gait and mobility (R26.89);Muscle weakness (generalized)  (M62.81);Pain Pain - Right/Left: Left (back) Pain - part of body:  (back)                Time: 4707-6151 OT Time Calculation (min): 34 min Charges:  OT General Charges $OT Visit: 1 Visit OT Evaluation $OT Eval Low Complexity: 1 Low OT Treatments $Self Care/Home Management : 8-22 mins  Lorre Munroe, OTR/L  Lorre Munroe 11/03/2020, 8:52 AM

## 2020-11-03 NOTE — Discharge Summary (Signed)
Physician Discharge Summary  Patient ID: [name] MRN: [id number] DOB/AGE: April 04, 1961 59 y.o.  Admit date: 11/02/2020 Discharge date: 11/03/2020  Admission Diagnoses:lumbar spondylolisthesis L4/5  Discharge Diagnoses: same Active Problems:   Spondylolisthesis of lumbar region   Discharged Condition: good  Hospital Course: [name] was admitted and taken to the operating room for an uncomplicated lumbar decompression and arthrodesis with Nuvasive relign hardware. Post op she is voiding, ambulating, and tolerating a regular diet. Her strength is normal. Wound is clean, and dry at discharge.   Treatments: surgery: Lumbar four-five posterior lumbar interbody fusion pedicle screw fixation, interbody cages L4 laminectomy for decompression  Discharge Exam: Blood pressure 102/67, pulse 76, temperature 98.1 F (36.7 C), temperature source Oral, resp. rate 16, height 5\' 3"  (1.6 m), weight 114.9 kg, SpO2 98 %. General appearance: alert, cooperative, appears stated age and no distress  Disposition: Discharge disposition: 01-Home or Self Care      Lumbar stenosis with neurogenic claudication  Allergies as of 11/03/2020   No Known Allergies     Medication List    STOP taking these medications   metFORMIN 500 MG tablet Commonly known as: GLUCOPHAGE   nabumetone 500 MG tablet Commonly known as: RELAFEN   naproxen sodium 220 MG tablet Commonly known as: ALEVE     TAKE these medications   acetaminophen 500 MG tablet Commonly known as: TYLENOL Take 1,000 mg by mouth in the morning, at noon, and at bedtime.   Aubagio 14 MG Tabs Generic drug: Teriflunomide Take 14 mg by mouth daily.   calcium-vitamin D 500-200 MG-UNIT tablet Commonly known as: OSCAL WITH D Take 1 tablet by mouth daily with breakfast.   gabapentin 300 MG capsule Commonly known as: NEURONTIN Take 300 mg by mouth 3 (three) times daily.   hydrochlorothiazide 25 MG tablet Commonly known as:  HYDRODIURIL Take 25 mg by mouth daily.   losartan 100 MG tablet Commonly known as: COZAAR Take 100 mg by mouth daily.   modafinil 200 MG tablet Commonly known as: PROVIGIL Take 200 mg by mouth in the morning and at bedtime.   multivitamin with minerals tablet Take 1 tablet by mouth daily.   oxyCODONE 5 MG immediate release tablet Commonly known as: Oxy IR/ROXICODONE Take 1 tablet (5 mg total) by mouth every 3 (three) hours as needed for up to 7 days for moderate pain ((score 4 to 6)).   rosuvastatin 10 MG tablet Commonly known as: CRESTOR Take 10 mg by mouth at bedtime.   Vitamin D-3 125 MCG (5000 UT) Tabs Take 10,000 Units by mouth daily.            Durable Medical Equipment  (From admission, onward)         Start     Ordered   11/03/20 1321  For home use only DME 3 n 1  Once        11/03/20 1321   11/03/20 1321  For home use only DME Walker  Once       Question:  Patient needs a walker to treat with the following condition  Answer:  S/P lumbar spinal fusion   11/03/20 1321          Follow-up Information    13/05/21, MD Follow up in 3 week(s).   Specialty: Neurosurgery Why: please call the office to make an appointment Contact information: [address] [place] [phone]  SignedColetta Memos 11/03/2020, 1:30 PM

## 2020-11-03 NOTE — TOC Transition Note (Signed)
Transition of Care [facility]) - CM/SW Discharge Note   Patient Details  Name: TEIRRA CARAPIA MRN: [id number] Date of Birth: 05/07/1961  [facility]) CM/SW Contact:  Beckie Busing, RN Phone Number: [phone]  11/03/2020, 4:02 PM   Clinical Narrative:    [facility] consulted for [facility] needs. Choice offered to patient. Home Health has been set up with [facility]. Start of service to begin Monday per [facility]. Patient has been made aware. No further needs noted at this time. CM will sign off.   Final next level of care: Home w Home Health Services Barriers to Discharge: No Barriers Identified   Patient Goals and CMS Choice Patient states their goals for this hospitalization and ongoing recovery are:: ready to go home CMS Medicare.gov Compare Post Acute Care list provided to:: Patient Choice offered to / list presented to : Patient  Discharge Placement                       Discharge Plan and Services                DME Arranged: N/A DME Agency: NA       HH Arranged: OT, PT HH Agency: [facility] Date HH Agency Contacted: 11/03/20 Time HH Agency Contacted: 1601 Representative spoke with at [facility] Agency: Amy  Social Determinants of Health (SDOH) Interventions     Readmission Risk Interventions No flowsheet data found.

## 2020-11-03 NOTE — Evaluation (Signed)
Physical Therapy Evaluation Patient Details Name: SAYURI RHAMES MRN: [id number] DOB: 1961/03/13 Today's Date: 11/03/2020   History of Present Illness  59 y.o. female with PMH of MS, back pain, appendectomy, rotator cuff repair, HTN, HLD. Pt reports a history of chronic back pain affecting her ability to mobilize and has failed conservative treatment. MRI demonstrates  significant facet arthropathy at L4-5, spondylolisthesis L4 on 5, and fairly severe spinal stenosis. Pt underwent lumbar decompression and arthrodesis on 11/02/2020.  Clinical Impression  Pt presents to PT with deficits in functional mobility, gait, balance, endurance, strength, power, and with low back pain. Pt requires verbal cues for bed mobility technique as well as close assistance of PT for safety during transfers and ambulation without the use of an assistive device. Pt and PT discuss the use of a RW for transfers and for ambulation to improve stability and activity tolerance. Pt does express some concerns about being able to perform bed mobility without assistance when her daughter has to leave on Monday. PT provides suggestion of sleeping on her couch recliner or calling other family to assist as needed. Pt will benefit from continued acute PT POC to improve activity tolerance and bed mobility quality. PT recommends the pt receive HHPT services and a RW at the time of discharge.    Follow Up Recommendations Home health PT;Supervision for mobility/OOB    Equipment Recommendations  Rolling walker with 5" wheels    Recommendations for Other Services       Precautions / Restrictions Precautions Precautions: Fall;Back Precaution Booklet Issued: No (booklet present upon PT arrival) Precaution Comments: no brace Restrictions Weight Bearing Restrictions: No      Mobility  Bed Mobility Overal bed mobility: Needs Assistance Bed Mobility: Rolling;Sidelying to Sit;Sit to Sidelying Rolling: Supervision Sidelying to sit:  Min guard     Sit to sidelying: Min guard General bed mobility comments: PT cues for technique to maintain back precautions    Transfers Overall transfer level: Needs assistance Equipment used: 1 person hand held assist (bed rail) Transfers: Sit to/from Stand Sit to Stand: Min assist         General transfer comment: HHA and use of bed rail, slow ascent to standing  Ambulation/Gait Ambulation/Gait assistance: Min guard Gait Distance (Feet): 100 Feet Assistive device: 1 person hand held assist Gait Pattern/deviations: Step-to pattern Gait velocity: reduced Gait velocity interpretation: <1.31 ft/sec, indicative of household ambulator General Gait Details: pt with short step to gait, reduced step length, slowed gait speed  Stairs Stairs: Yes Stairs assistance: Min guard Stair Management: One rail Right;Step to pattern;Sideways Number of Stairs: 3 General stair comments: 2 trials of stair negotiation  Wheelchair Mobility    Modified Rankin (Stroke Patients Only)       Balance Overall balance assessment: Needs assistance Sitting-balance support: No upper extremity supported;Feet supported Sitting balance-Leahy Scale: Good     Standing balance support: No upper extremity supported Standing balance-Leahy Scale: Fair Standing balance comment: fair+ static standing, mild unsteadiness during dynamic tasks without UE support                             Pertinent Vitals/Pain Pain Assessment: 0-10 Pain Score: 6  Faces Pain Scale: Hurts little more Pain Location: low back Pain Descriptors / Indicators: Aching Pain Intervention(s): Monitored during session;Patient requesting pain meds-RN notified    Home Living Family/patient expects to be discharged to:: Private residence Living Arrangements: Alone Available Help at Discharge: Family;Available  PRN/intermittently (dtr present for 24/7 until monday 11/5) Type of Home: House Home Access: Stairs to  enter Entrance Stairs-Rails: Doctor, general practice of Steps: 3 Home Layout: One level Home Equipment: Cane - single point Additional Comments: Daughter plans to stay with pt to assist until Monday. Pt reports she has access to other friends or family that can assist as needed     Prior Function Level of Independence: Independent with assistive device(s)         Comments: pt works in a call center, ambulated with cane recently     Hand Dominance   Dominant Hand: Right    Extremity/Trunk Assessment   Upper Extremity Assessment Upper Extremity Assessment: Overall WFL for tasks assessed    Lower Extremity Assessment Lower Extremity Assessment: Generalized weakness    Cervical / Trunk Assessment Cervical / Trunk Assessment: Kyphotic  Communication   Communication: No difficulties  Cognition Arousal/Alertness: Awake/alert Behavior During Therapy: WFL for tasks assessed/performed Overall Cognitive Status: Within Functional Limits for tasks assessed                                        General Comments General comments (skin integrity, edema, etc.): VSS on RA, pt able to verbalize back precautions. PT attempts to assist in educating the pt on strategies to improve independence in bed mobility and for household mobility    Exercises     Assessment/Plan    PT Assessment Patient needs continued PT services  PT Problem List Decreased strength;Decreased activity tolerance;Decreased balance;Decreased mobility;Decreased knowledge of use of DME;Decreased knowledge of precautions;Pain;Impaired sensation       PT Treatment Interventions DME instruction;Gait training;Stair training;Functional mobility training;Therapeutic activities;Balance training;Neuromuscular re-education;Patient/family education    PT Goals (Current goals can be found in the Care Plan section)  Acute Rehab PT Goals Patient Stated Goal: To return to independence PT Goal  Formulation: With patient Time For Goal Achievement: 11/17/20 Potential to Achieve Goals: Good    Frequency 7X/week   Barriers to discharge        Co-evaluation               AM-PAC PT "6 Clicks" Mobility  Outcome Measure Help needed turning from your back to your side while in a flat bed without using bedrails?: None Help needed moving from lying on your back to sitting on the side of a flat bed without using bedrails?: A Little Help needed moving to and from a bed to a chair (including a wheelchair)?: A Little Help needed standing up from a chair using your arms (e.g., wheelchair or bedside chair)?: A Little Help needed to walk in hospital room?: A Little Help needed climbing 3-5 steps with a railing? : A Little 6 Click Score: 19    End of Session   Activity Tolerance: Patient tolerated treatment well Patient left: in chair;with call bell/phone within reach Nurse Communication: Mobility status PT Visit Diagnosis: Unsteadiness on feet (R26.81);Other abnormalities of gait and mobility (R26.89);Pain Pain - Right/Left: Left Pain - part of body:  (low back)    Time: 1610-9604 PT Time Calculation (min) (ACUTE ONLY): 24 min   Charges:   PT Evaluation $PT Eval Low Complexity: 1 Low PT Treatments $Gait Training: 8-22 mins        Arlyss Gandy, PT, DPT Acute Rehabilitation Pager: [phone]   Arlyss Gandy 11/03/2020, 9:28 AM

## 2020-11-03 NOTE — Plan of Care (Signed)
Patient alert and oriented, mae's well, voiding adequate amount of urine, swallowing without difficulty, no c/o pain at time of discharge. Patient discharged home with family. Script and discharged instructions given to patient. Patient and family stated understanding of instructions given. Patient has an appointment with Dr. Cabbell   

## 2020-11-03 NOTE — Discharge Instructions (Addendum)
       Wound Care Remove outer dressing in 2-3 days Leave incision open to air. You may shower. Do not scrub directly on incision.  Do not put any creams, lotions, or ointments on incision. Activity Walk each and every day, increasing distance each day. No lifting greater than 5 lbs.  Avoid bending, arching, and twisting. No driving for 2 weeks; may ride as a passenger locally. If provided with back brace, wear when out of bed.  It is not necessary to wear in bed. Diet Resume your normal diet.  Return to Work Will be discussed at you follow up appointment. Call Your Doctor If Any of These Occur Redness, drainage, or swelling at the wound.  Temperature greater than 101 degrees. Severe pain not relieved by pain medication. Incision starts to come apart. Follow Up Appt Call today for appointment in 2-3 weeks ([phone]) or for problems.  If you have any hardware placed in your spine, you will need an x-ray before your appointment.           Spinal Fusion Care After Refer to this sheet in the next few weeks. These instructions provide you with information on caring for yourself after your procedure. Your caregiver may also give you more specific instructions. Your treatment has been planned according to current medical practices, but problems sometimes occur. Call your caregiver if you have any problems or questions after your procedure. HOME CARE INSTRUCTIONS   Take whatever pain medicine has been prescribed by your caregiver. Do not take over-the-counter pain medicine unless directed otherwise by your caregiver.   Do not drive if you are taking narcotic pain medicines.   Change your bandage (dressing) if necessary or as directed by your caregiver.   You may shower. The wound may get wet, simply pat the area dry. It will take ~2 weeks for the glue to peel off.  If you have been prescribed medicine to prevent your blood from clotting, follow the directions carefully.    Check the area around your incision often. Look for redness and swelling. Also, look for anything leaking from your wound. You can use a mirror or have a family member inspect your incision if it is in a place where it is difficult for you to see.   Ask your caregiver what activities you should avoid and for how long.   Walk as much as possible.   Do not lift anything heavier than 5 lbs until your caregiver says it is safe.   Do not twist or bend for a few weeks. Try not to pull on things. Avoid sitting for long periods of time. Change positions at least every hour.

## 2020-12-04 ENCOUNTER — Encounter (HOSPITAL_COMMUNITY): Payer: Self-pay | Admitting: Neurosurgery

## 2020-12-04 ENCOUNTER — Other Ambulatory Visit: Payer: Self-pay | Admitting: Neurosurgery

## 2020-12-04 ENCOUNTER — Other Ambulatory Visit: Payer: Self-pay

## 2020-12-04 NOTE — Progress Notes (Signed)
Denies chest pain, shortness of breath, or cardiology visit. Denies symptoms/ exposure to COVID. Educated on visitation policy and verbalized understanding.

## 2020-12-05 ENCOUNTER — Ambulatory Visit (HOSPITAL_COMMUNITY): Payer: 59 | Admitting: Anesthesiology

## 2020-12-05 ENCOUNTER — Observation Stay (HOSPITAL_COMMUNITY)
Admission: RE | Admit: 2020-12-05 | Discharge: 2020-12-06 | Disposition: A | Discharge status: Home / Self Care | Payer: 59 | Attending: Neurosurgery | Admitting: Neurosurgery

## 2020-12-05 ENCOUNTER — Encounter (HOSPITAL_COMMUNITY): Payer: Self-pay | Admitting: Neurosurgery

## 2020-12-05 ENCOUNTER — Encounter (HOSPITAL_COMMUNITY)
Admission: RE | Disposition: A | Discharge status: Home / Self Care | Payer: Self-pay | Source: Home / Self Care | Attending: Neurosurgery

## 2020-12-05 DIAGNOSIS — T8142XA Infection following a procedure, deep incisional surgical site, initial encounter: Secondary | ICD-10-CM | POA: Diagnosis present

## 2020-12-05 DIAGNOSIS — Z20822 Contact with and (suspected) exposure to covid-19: Secondary | ICD-10-CM | POA: Insufficient documentation

## 2020-12-05 DIAGNOSIS — M96842 Postprocedural seroma of a musculoskeletal structure following a musculoskeletal system procedure: Secondary | ICD-10-CM | POA: Diagnosis not present

## 2020-12-05 HISTORY — PX: LUMBAR WOUND DEBRIDEMENT: SHX1988

## 2020-12-05 LAB — BASIC METABOLIC PANEL
Anion gap: 12 (ref 5–15)
BUN: 13 mg/dL (ref 8–23)
CO2: 25 mmol/L (ref 22–32)
Calcium: 9.2 mg/dL (ref 8.9–10.3)
Chloride: 101 mmol/L (ref 98–111)
Creatinine, Ser: 0.7 mg/dL (ref 0.44–1.00)
GFR, Estimated: >60 mL/min (ref >60)
Glucose, Bld: 93 mg/dL (ref 70–99)
Potassium: 3.4 mmol/L — ABNORMAL LOW (ref 3.5–5.1)
Sodium: 138 mmol/L (ref 135–145)

## 2020-12-05 LAB — CBC
HCT: 35.8 % — ABNORMAL LOW (ref 36.0–46.0)
Hemoglobin: 11.3 g/dL — ABNORMAL LOW (ref 12.0–15.0)
MCH: 28.8 pg (ref 26.0–34.0)
MCHC: 31.6 g/dL (ref 30.0–36.0)
MCV: 91.3 fL (ref 80.0–100.0)
Platelets: 246 10*3/uL (ref 150–400)
RBC: 3.92 MIL/uL (ref 3.87–5.11)
RDW: 13.2 % (ref 11.5–15.5)
WBC: 5 10*3/uL (ref 4.0–10.5)
nRBC: 0 % (ref 0.0–0.2)

## 2020-12-05 LAB — SURGICAL PCR SCREEN
MRSA, PCR: NEGATIVE
Staphylococcus aureus: NEGATIVE

## 2020-12-05 LAB — SARS CORONAVIRUS 2 BY RT PCR (HOSPITAL ORDER, PERFORMED IN ~~LOC~~ HOSPITAL LAB): SARS Coronavirus 2: NEGATIVE

## 2020-12-05 SURGERY — LUMBAR WOUND DEBRIDEMENT
Anesthesia: General | Site: Spine Lumbar

## 2020-12-05 MED ORDER — LIDOCAINE 2% (20 MG/ML) 5 ML SYRINGE
INTRAMUSCULAR | Status: DC | PRN
  Administered 2020-12-05: 40 mg via INTRAVENOUS

## 2020-12-05 MED ORDER — BACITRACIN ZINC 500 UNIT/GM EX OINT
TOPICAL_OINTMENT | CUTANEOUS | Status: AC
  Filled 2020-12-05: qty 28.35

## 2020-12-05 MED ORDER — ONDANSETRON HCL 4 MG PO TABS: 4.0000 mg | ORAL_TABLET | Freq: Four times a day (QID) | ORAL | Status: DC | PRN

## 2020-12-05 MED ORDER — FENTANYL CITRATE (PF) 100 MCG/2ML IJ SOLN
INTRAMUSCULAR | Status: AC
  Filled 2020-12-05: qty 2

## 2020-12-05 MED ORDER — ROCURONIUM BROMIDE 10 MG/ML (PF) SYRINGE
PREFILLED_SYRINGE | INTRAVENOUS | Status: DC | PRN
  Administered 2020-12-05: 50 mg via INTRAVENOUS

## 2020-12-05 MED ORDER — FENTANYL CITRATE (PF) 250 MCG/5ML IJ SOLN
INTRAMUSCULAR | Status: DC | PRN
  Administered 2020-12-05 (×3): 50 ug via INTRAVENOUS
  Administered 2020-12-05: 100 ug via INTRAVENOUS

## 2020-12-05 MED ORDER — CALCIUM CARBONATE-VITAMIN D 500-200 MG-UNIT PO TABS
1.0000 | ORAL_TABLET | Freq: Every day | ORAL | Status: DC
  Administered 2020-12-06: 1 via ORAL
  Filled 2020-12-05: qty 1

## 2020-12-05 MED ORDER — FENTANYL CITRATE (PF) 250 MCG/5ML IJ SOLN
INTRAMUSCULAR | Status: AC
  Filled 2020-12-05: qty 5

## 2020-12-05 MED ORDER — ADULT MULTIVITAMIN W/MINERALS CH
1.0000 | ORAL_TABLET | Freq: Every day | ORAL | Status: DC
  Administered 2020-12-05 – 2020-12-06 (×2): 1 via ORAL
  Filled 2020-12-05 (×2): qty 1

## 2020-12-05 MED ORDER — SUGAMMADEX SODIUM 200 MG/2ML IV SOLN
INTRAVENOUS | Status: DC | PRN
  Administered 2020-12-05: 200 mg via INTRAVENOUS

## 2020-12-05 MED ORDER — MIDAZOLAM HCL 2 MG/2ML IJ SOLN
INTRAMUSCULAR | Status: AC
  Filled 2020-12-05: qty 2

## 2020-12-05 MED ORDER — ONDANSETRON HCL 4 MG/2ML IJ SOLN: 4.0000 mg | Freq: Four times a day (QID) | INTRAMUSCULAR | Status: DC | PRN

## 2020-12-05 MED ORDER — CHLORHEXIDINE GLUCONATE CLOTH 2 % EX PADS: 6.0000 | MEDICATED_PAD | Freq: Once | CUTANEOUS | Status: DC

## 2020-12-05 MED ORDER — DOCUSATE SODIUM 100 MG PO CAPS
100.0000 mg | ORAL_CAPSULE | Freq: Two times a day (BID) | ORAL | Status: DC
  Administered 2020-12-05 – 2020-12-06 (×3): 100 mg via ORAL
  Filled 2020-12-05 (×3): qty 1

## 2020-12-05 MED ORDER — HYDROCHLOROTHIAZIDE 25 MG PO TABS
25.0000 mg | ORAL_TABLET | Freq: Every day | ORAL | Status: DC
  Filled 2020-12-05 (×2): qty 1

## 2020-12-05 MED ORDER — ROCURONIUM BROMIDE 10 MG/ML (PF) SYRINGE
PREFILLED_SYRINGE | INTRAVENOUS | Status: AC
  Filled 2020-12-05: qty 10

## 2020-12-05 MED ORDER — LOSARTAN POTASSIUM 50 MG PO TABS
100.0000 mg | ORAL_TABLET | Freq: Every day | ORAL | Status: DC
  Administered 2020-12-05 – 2020-12-06 (×2): 100 mg via ORAL
  Filled 2020-12-05 (×2): qty 2

## 2020-12-05 MED ORDER — PROPOFOL 10 MG/ML IV BOLUS
INTRAVENOUS | Status: DC | PRN
  Administered 2020-12-05: 130 mg via INTRAVENOUS

## 2020-12-05 MED ORDER — SODIUM CHLORIDE 0.9 % IV SOLN: 250.0000 mL | INTRAVENOUS | Status: DC

## 2020-12-05 MED ORDER — ORAL CARE MOUTH RINSE: 15.0000 mL | Freq: Once | OROMUCOSAL | Status: AC

## 2020-12-05 MED ORDER — CEFAZOLIN SODIUM-DEXTROSE 2-4 GM/100ML-% IV SOLN
2.0000 g | INTRAVENOUS | Status: AC
  Administered 2020-12-05: 2 g via INTRAVENOUS
  Filled 2020-12-05: qty 100

## 2020-12-05 MED ORDER — KETOROLAC TROMETHAMINE 15 MG/ML IJ SOLN
15.0000 mg | Freq: Four times a day (QID) | INTRAMUSCULAR | Status: AC
  Administered 2020-12-05 – 2020-12-06 (×4): 15 mg via INTRAVENOUS
  Filled 2020-12-05 (×4): qty 1

## 2020-12-05 MED ORDER — THROMBIN 5000 UNITS EX SOLR
CUTANEOUS | Status: DC | PRN
  Administered 2020-12-05 (×2): 5000 [IU] via TOPICAL

## 2020-12-05 MED ORDER — ONDANSETRON HCL 4 MG/2ML IJ SOLN
INTRAMUSCULAR | Status: AC
  Filled 2020-12-05: qty 2

## 2020-12-05 MED ORDER — VANCOMYCIN HCL 1000 MG IV SOLR
INTRAVENOUS | Status: AC
  Filled 2020-12-05: qty 1000

## 2020-12-05 MED ORDER — OXYCODONE HCL 5 MG PO TABS: 5.0000 mg | ORAL_TABLET | ORAL | Status: DC | PRN

## 2020-12-05 MED ORDER — DEXAMETHASONE SODIUM PHOSPHATE 10 MG/ML IJ SOLN
INTRAMUSCULAR | Status: AC
  Filled 2020-12-05: qty 1

## 2020-12-05 MED ORDER — TERIFLUNOMIDE 14 MG PO TABS: 14.0000 mg | ORAL_TABLET | Freq: Every day | ORAL | Status: DC

## 2020-12-05 MED ORDER — LIDOCAINE HCL (PF) 2 % IJ SOLN
INTRAMUSCULAR | Status: AC
  Filled 2020-12-05: qty 5

## 2020-12-05 MED ORDER — DEXAMETHASONE SODIUM PHOSPHATE 10 MG/ML IJ SOLN
INTRAMUSCULAR | Status: DC | PRN
  Administered 2020-12-05: 8 mg via INTRAVENOUS

## 2020-12-05 MED ORDER — BACITRACIN ZINC 500 UNIT/GM EX OINT
TOPICAL_OINTMENT | CUTANEOUS | Status: DC | PRN
  Administered 2020-12-05: 1 via TOPICAL

## 2020-12-05 MED ORDER — HEMOSTATIC AGENTS (NO CHARGE) OPTIME
TOPICAL | Status: DC | PRN
  Administered 2020-12-05: 1 via TOPICAL

## 2020-12-05 MED ORDER — 0.9 % SODIUM CHLORIDE (POUR BTL) OPTIME
TOPICAL | Status: DC | PRN
  Administered 2020-12-05: 1000 mL

## 2020-12-05 MED ORDER — PHENOL 1.4 % MT LIQD: 1.0000 | OROMUCOSAL | Status: DC | PRN

## 2020-12-05 MED ORDER — ACETAMINOPHEN 500 MG PO TABS
1000.0000 mg | ORAL_TABLET | Freq: Once | ORAL | Status: AC
  Administered 2020-12-05: 1000 mg via ORAL
  Filled 2020-12-05: qty 2

## 2020-12-05 MED ORDER — LIDOCAINE-EPINEPHRINE 0.5 %-1:200000 IJ SOLN
INTRAMUSCULAR | Status: AC
  Filled 2020-12-05: qty 1

## 2020-12-05 MED ORDER — FENTANYL CITRATE (PF) 100 MCG/2ML IJ SOLN
25.0000 ug | INTRAMUSCULAR | Status: DC | PRN
  Administered 2020-12-05 (×3): 50 ug via INTRAVENOUS

## 2020-12-05 MED ORDER — HEPARIN SODIUM (PORCINE) 5000 UNIT/ML IJ SOLN
5000.0000 [IU] | Freq: Three times a day (TID) | INTRAMUSCULAR | Status: DC
  Administered 2020-12-06: 5000 [IU] via SUBCUTANEOUS
  Filled 2020-12-05: qty 1

## 2020-12-05 MED ORDER — OXYCODONE HCL 5 MG PO TABS
10.0000 mg | ORAL_TABLET | ORAL | Status: DC | PRN
  Administered 2020-12-05 – 2020-12-06 (×7): 10 mg via ORAL
  Filled 2020-12-05 (×7): qty 2

## 2020-12-05 MED ORDER — MODAFINIL 100 MG PO TABS
200.0000 mg | ORAL_TABLET | Freq: Two times a day (BID) | ORAL | Status: DC
  Administered 2020-12-05 – 2020-12-06 (×2): 200 mg via ORAL
  Filled 2020-12-05 (×2): qty 2

## 2020-12-05 MED ORDER — MIDAZOLAM HCL 2 MG/2ML IJ SOLN
INTRAMUSCULAR | Status: DC | PRN
  Administered 2020-12-05: 2 mg via INTRAVENOUS

## 2020-12-05 MED ORDER — ACETAMINOPHEN 650 MG RE SUPP: 650.0000 mg | RECTAL | Status: DC | PRN

## 2020-12-05 MED ORDER — CHLORHEXIDINE GLUCONATE 0.12 % MT SOLN
15.0000 mL | Freq: Once | OROMUCOSAL | Status: AC
  Administered 2020-12-05: 15 mL via OROMUCOSAL
  Filled 2020-12-05: qty 15

## 2020-12-05 MED ORDER — THROMBIN 5000 UNITS EX SOLR
CUTANEOUS | Status: AC
  Filled 2020-12-05: qty 10000

## 2020-12-05 MED ORDER — CEFAZOLIN SODIUM-DEXTROSE 2-4 GM/100ML-% IV SOLN
2.0000 g | Freq: Three times a day (TID) | INTRAVENOUS | Status: AC
  Administered 2020-12-05 – 2020-12-06 (×3): 2 g via INTRAVENOUS
  Filled 2020-12-05 (×4): qty 100

## 2020-12-05 MED ORDER — LACTATED RINGERS IV SOLN: INTRAVENOUS | Status: DC

## 2020-12-05 MED ORDER — POTASSIUM CHLORIDE IN NACL 20-0.9 MEQ/L-% IV SOLN: INTRAVENOUS | Status: DC

## 2020-12-05 MED ORDER — GABAPENTIN 300 MG PO CAPS
300.0000 mg | ORAL_CAPSULE | Freq: Three times a day (TID) | ORAL | Status: DC
  Administered 2020-12-05 – 2020-12-06 (×3): 300 mg via ORAL
  Filled 2020-12-05 (×3): qty 1

## 2020-12-05 MED ORDER — SODIUM CHLORIDE 0.9% FLUSH
3.0000 mL | Freq: Two times a day (BID) | INTRAVENOUS | Status: DC
  Administered 2020-12-06: 3 mL via INTRAVENOUS

## 2020-12-05 MED ORDER — ROSUVASTATIN CALCIUM 5 MG PO TABS
10.0000 mg | ORAL_TABLET | Freq: Every day | ORAL | Status: DC
  Administered 2020-12-05: 10 mg via ORAL
  Filled 2020-12-05: qty 2

## 2020-12-05 MED ORDER — SODIUM CHLORIDE 0.9% FLUSH: 3.0000 mL | INTRAVENOUS | Status: DC | PRN

## 2020-12-05 MED ORDER — ONDANSETRON HCL 4 MG/2ML IJ SOLN
INTRAMUSCULAR | Status: DC | PRN
  Administered 2020-12-05: 4 mg via INTRAVENOUS

## 2020-12-05 MED ORDER — MENTHOL 3 MG MT LOZG: 1.0000 | LOZENGE | OROMUCOSAL | Status: DC | PRN

## 2020-12-05 MED ORDER — ACETAMINOPHEN 325 MG PO TABS
650.0000 mg | ORAL_TABLET | ORAL | Status: DC | PRN
  Administered 2020-12-05 – 2020-12-06 (×2): 650 mg via ORAL
  Filled 2020-12-05 (×2): qty 2

## 2020-12-05 MED ORDER — VITAMIN D 25 MCG (1000 UNIT) PO TABS
10000.0000 [IU] | ORAL_TABLET | Freq: Every day | ORAL | Status: DC
  Filled 2020-12-05 (×2): qty 10

## 2020-12-05 MED ORDER — TIZANIDINE HCL 4 MG PO TABS
4.0000 mg | ORAL_TABLET | Freq: Four times a day (QID) | ORAL | Status: DC | PRN
  Administered 2020-12-05: 4 mg via ORAL
  Filled 2020-12-05: qty 1

## 2020-12-05 SURGICAL SUPPLY — 33 items
CANISTER SUCT 3000ML PPV (MISCELLANEOUS) ×2 IMPLANT
DRAPE LAPAROTOMY 100X72X124 (DRAPES) ×2 IMPLANT
DRAPE SURG 17X23 STRL (DRAPES) ×2 IMPLANT
DRSG OPSITE POSTOP 4X8 (GAUZE/BANDAGES/DRESSINGS) ×2 IMPLANT
DURAPREP 26ML APPLICATOR (WOUND CARE) ×2 IMPLANT
ELECT REM PT RETURN 9FT ADLT (ELECTROSURGICAL) ×2
ELECTRODE REM PT RTRN 9FT ADLT (ELECTROSURGICAL) ×1 IMPLANT
GLOVE BIOGEL PI IND STRL 7.0 (GLOVE) ×2 IMPLANT
GLOVE BIOGEL PI IND STRL 7.5 (GLOVE) ×2 IMPLANT
GLOVE BIOGEL PI INDICATOR 7.0 (GLOVE) ×2
GLOVE BIOGEL PI INDICATOR 7.5 (GLOVE) ×2
GLOVE ECLIPSE 6.5 STRL STRAW (GLOVE) ×4 IMPLANT
GOWN STRL REUS W/ TWL LRG LVL3 (GOWN DISPOSABLE) ×1 IMPLANT
GOWN STRL REUS W/ TWL XL LVL3 (GOWN DISPOSABLE) IMPLANT
GOWN STRL REUS W/TWL 2XL LVL3 (GOWN DISPOSABLE) ×4 IMPLANT
GOWN STRL REUS W/TWL LRG LVL3 (GOWN DISPOSABLE) ×1
GOWN STRL REUS W/TWL XL LVL3 (GOWN DISPOSABLE)
KIT BASIN OR (CUSTOM PROCEDURE TRAY) ×2 IMPLANT
KIT TURNOVER KIT B (KITS) ×2 IMPLANT
NS IRRIG 1000ML POUR BTL (IV SOLUTION) ×2 IMPLANT
PACK LAMINECTOMY NEURO (CUSTOM PROCEDURE TRAY) ×2 IMPLANT
SPONGE LAP 4X18 RFD (DISPOSABLE) IMPLANT
SPONGE SURGIFOAM ABS GEL SZ50 (HEMOSTASIS) ×2 IMPLANT
SUT ETHILON 1 TP 1 60 (SUTURE) ×2 IMPLANT
SUT VIC AB 0 CT1 18XCR BRD8 (SUTURE) ×1 IMPLANT
SUT VIC AB 0 CT1 8-18 (SUTURE) ×1
SUT VIC AB 2-0 CT1 18 (SUTURE) ×2 IMPLANT
SUT VIC AB 3-0 SH 8-18 (SUTURE) ×2 IMPLANT
SWAB COLLECTION DEVICE MRSA (MISCELLANEOUS) ×2 IMPLANT
SWAB CULTURE ESWAB REG 1ML (MISCELLANEOUS) ×2 IMPLANT
TOWEL GREEN STERILE (TOWEL DISPOSABLE) ×2 IMPLANT
TOWEL GREEN STERILE FF (TOWEL DISPOSABLE) ×2 IMPLANT
WATER STERILE IRR 1000ML POUR (IV SOLUTION) ×2 IMPLANT

## 2020-12-05 NOTE — Progress Notes (Signed)
Ms [name] called this am to verify which medication she should hold, HCTZ or Hydrocodone- Acetamophen, I told patient to not take the HCTZ.

## 2020-12-05 NOTE — Evaluation (Signed)
Occupational Therapy Evaluation Patient Details Name: [name] MRN: [id number] DOB: 22-Oct-1961 Today's Date: 12/05/2020    History of Present Illness 59 yo female s/p lumbar wound exploration. Pt underwent lumbar decompression and arthrodesis on 11/02/2020. PMH including MS, high BP, and obesity.    Clinical Impression   PTA, pt was living alone and was independent with use of AE and cane/RW; continued to have HHPT and recent dc from [place]. Currently, pt performing ADLs and functional mobility at Early level with AE. Provided education on back precautions, bed mobility, LB ADLs, toileting, and functional transfer; pt demonstrated understanding. Answered all pt questions. Recommend dc home once medically stable per physician. Defer further OT needs to [facility] as acute OT needs met and will sign off. Thank you.    Follow Up Recommendations  Home health OT    Equipment Recommendations  None recommended by OT    Recommendations for Other Services       Precautions / Restrictions Precautions Precautions: Back Precaution Booklet Issued: Yes (comment) Precaution Comments: Reviewed back precautions and compensatory techniques for ADLs Required Braces or Orthoses: Other Brace Other Brace: No brace per MD order Restrictions Weight Bearing Restrictions: No      Mobility Bed Mobility Overal bed mobility: Needs Assistance Bed Mobility: Rolling;Sit to Sidelying;Sidelying to Sit Rolling: Min guard Sidelying to sit: Min guard     Sit to sidelying: Min guard General bed mobility comments:  Min Guard A for safety    Transfers Overall transfer level: Needs assistance Equipment used: 1 person hand held assist Transfers: Sit to/from Stand Sit to Stand: Min guard         General transfer comment: Min Guard A for safety    Balance Overall balance assessment: Needs assistance Sitting-balance support: No upper extremity supported;Feet supported Sitting  balance-Leahy Scale: Good     Standing balance support: No upper extremity supported;During functional activity Standing balance-Leahy Scale: Fair                             ADL either performed or assessed with clinical judgement   ADL Overall ADL's : Needs assistance/impaired Eating/Feeding: Set up;Sitting   Grooming: Set up;Sitting;Supervision/safety   Upper Body Bathing: Supervision/ safety;Set up;Sitting   Lower Body Bathing: Min guard;With adaptive equipment;Sit to/from stand   Upper Body Dressing : Supervision/safety;Set up;Sitting   Lower Body Dressing: Min guard;Sit to/from stand;With adaptive equipment   Toilet Transfer: Min guard;Ambulation;Regular Toilet           Functional mobility during ADLs: Min guard General ADL Comments: Pt performing ADLs and functional mobility at [employer] A level. Reviewing back precautions and compensatory tehcniques for prior sx. Pt verbalized and demonstrated understanding     Vision Baseline Vision/History: Wears glasses Patient Visual Report: No change from baseline       Perception     Praxis      Pertinent Vitals/Pain Pain Assessment: Faces Faces Pain Scale: Hurts little more Pain Location: low back Pain Descriptors / Indicators: Constant;Discomfort Pain Intervention(s): Monitored during session;Limited activity within patient's tolerance;Repositioned     Hand Dominance Right   Extremity/Trunk Assessment Upper Extremity Assessment Upper Extremity Assessment: Overall WFL for tasks assessed   Lower Extremity Assessment Lower Extremity Assessment: Defer to PT evaluation   Cervical / Trunk Assessment Cervical / Trunk Assessment: Other exceptions Cervical / Trunk Exceptions: s/p back sx   Communication Communication Communication: No difficulties   Cognition Arousal/Alertness: Awake/alert  Behavior During Therapy: WFL for tasks assessed/performed Overall Cognitive Status: Within  Functional Limits for tasks assessed                                     General Comments  VSS    Exercises     Shoulder Instructions      Home Living Family/patient expects to be discharged to:: Private residence Living Arrangements: Alone Available Help at Discharge: Friend(s);Family;Available PRN/intermittently Type of Home: House Home Access: Stairs to enter [employer] of Steps: 3 Entrance Stairs-Rails: Right;Left Home Layout: One level     Bathroom Shower/Tub: Teacher, early years/pre: Standard Bathroom Accessibility: Yes How Accessible: Accessible via walker Home Equipment: Cane - single point          Prior Functioning/Environment Level of Independence: Independent with assistive device(s)        Comments: Prior to recent back sx, 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 Was working with HHPT and [place] after recent sx. HHOT was working on using AE for LB ADLs, cooking, and cleaning. Pt was using a SPC until recent pain and switched to RW.         OT Problem List: Decreased strength;Decreased activity tolerance;Impaired balance (sitting and/or standing);Decreased knowledge of use of DME or AE      OT Treatment/Interventions: Self-care/ADL training;Therapeutic exercise;DME and/or AE instruction;Therapeutic activities;Patient/family education;Balance training    OT Goals(Current goals can be found in the care plan section) Acute Rehab OT Goals Patient Stated Goal: Return home and regain independence OT Goal Formulation: All assessment and education complete, DC therapy  OT Frequency: Min 2X/week   Barriers to D/C:            Co-evaluation              AM-PAC OT "6 Clicks" Daily Activity     Outcome Measure Help from another person eating meals?: None Help from another person taking care of personal grooming?: A Little Help from another person toileting, which includes using toliet, bedpan, or urinal?: A  Little Help from another person bathing (including washing, rinsing, drying)?: A Little Help from another person to put on and taking off regular upper body clothing?: A Little Help from another person to put on and taking off regular lower body clothing?: A Little 6 Click Score: 19   End of Session Nurse Communication: Mobility status;Other (comment) (Pt at bathroom)  Activity Tolerance: Patient tolerated treatment well Patient left:  (In bathoom; notified NT)  OT Visit Diagnosis: Unsteadiness on feet (R26.81);Other abnormalities of gait and mobility (R26.89);Muscle weakness (generalized) (M62.81);Pain Pain - part of body:  (Back)                Time: 0932-3557 OT Time Calculation (min): 31 min Charges:  OT General Charges $OT Visit: 1 Visit OT Evaluation $OT Eval Low Complexity: 1 Low OT Treatments $Self Care/Home Management : 8-22 mins  Teghan Philbin MSOT, OTR/L Acute Rehab Pager: [phone] Office: [place] 12/05/2020, 5:29 PM

## 2020-12-05 NOTE — Anesthesia Preprocedure Evaluation (Addendum)
Anesthesia Evaluation  Patient identified by MRN, date of birth, ID band Patient awake    Reviewed: Allergy & Precautions, NPO status , Patient's Chart, lab work & pertinent test results  Airway Mallampati: I  TM Distance: >3 FB Neck ROM: Full    Dental  (+) Missing, Dental Advisory Given, Chipped,    Pulmonary neg pulmonary ROS,    Pulmonary exam normal breath sounds clear to auscultation       Cardiovascular hypertension, Pt. on medications Normal cardiovascular exam Rhythm:Regular Rate:Normal  HLD   Neuro/Psych  Neuromuscular disease (MS, hand/feet numbess, mobile at  home) negative psych ROS   GI/Hepatic negative GI ROS, Neg liver ROS,   Endo/Other  Morbid obesity (BMI 46)  Renal/GU negative Renal ROS  negative genitourinary   Musculoskeletal  (+) Arthritis , Osteoarthritis,    Abdominal   Peds  Hematology negative hematology ROS (+)   Anesthesia Other Findings   Reproductive/Obstetrics                            Anesthesia Physical Anesthesia Plan  ASA: III  Anesthesia Plan: General   Post-op Pain Management:    Induction: Intravenous  PONV Risk Score and Plan: 3 and Midazolam, Dexamethasone and Ondansetron  Airway Management Planned: Oral ETT  Additional Equipment:   Intra-op Plan:   Post-operative Plan: Extubation in OR  Informed Consent: I have reviewed the patients History and Physical, chart, labs and discussed the procedure including the risks, benefits and alternatives for the proposed anesthesia with the patient or authorized representative who has indicated his/her understanding and acceptance.     Dental advisory given  Plan Discussed with: CRNA  Anesthesia Plan Comments:         Anesthesia Quick Evaluation

## 2020-12-05 NOTE — H&P (Signed)
BP (!) 153/98   Pulse 85   Temp 98.1 F (36.7 C) (Oral)   Resp 18   Ht 5\' 3"  (1.6 m)   Wt 117.9 kg   SpO2 100%   BMI 46.04 kg/m  [name] is a 59 y.o. female Whom underwent a lumbar fusion with instrumentation and now appears to have a wound infection at the surgical site.  She neurologically is normal. No Known Allergies Past Medical History:  Diagnosis Date  . Back pain   . Chronic knee pain   . Edema of both lower extremities   . High blood pressure   . High cholesterol   . Joint pain   . Multiple sclerosis (HCC)   . Neuromuscular disorder (HCC)    Multiple Sclerosis over 20 years  . Obesity   . Vitamin D deficiency    Past Surgical History:  Procedure Laterality Date  . APPENDECTOMY    . CESAREAN SECTION    . HAND SURGERY    . lumbar back surgery    . ROTATOR CUFF REPAIR    . TONSILLECTOMY     Family History  Problem Relation Age of Onset  . High blood pressure Mother   . High Cholesterol Mother   . Thyroid disease Mother   . Cancer Mother   . High blood pressure Father   . High Cholesterol Father   . Heart disease Father   . Obesity Father    Social History   Socioeconomic History  . Marital status: Single    Spouse name: Not on file  . Number of children: Not on file  . Years of education: Not on file  . Highest education level: Not on file  Occupational History  . Occupation: work from [employer]  Tobacco Use  . Smoking status: Never Smoker  . Smokeless tobacco: Never Used  Vaping Use  . Vaping Use: Never used  Substance and Sexual Activity  . Alcohol use: Yes    Comment: social (1 time a month)  . Drug use: No  . Sexual activity: Not on file  Other Topics Concern  . Not on file  Social History Narrative  . Not on file   Social Determinants of Health   Financial Resource Strain:   . Difficulty of Paying Living Expenses: Not on file  Food Insecurity:   . Worried About Manufacturing systems engineer in the Last Year:  Not on file  . Ran Out of Food in the Last Year: Not on file  Transportation Needs:   . Lack of Transportation (Medical): Not on file  . Lack of Transportation (Non-Medical): Not on file  Physical Activity:   . Days of Exercise per Week: Not on file  . Minutes of Exercise per Session: Not on file  Stress:   . Feeling of Stress : Not on file  Social Connections:   . Frequency of Communication with Friends and Family: Not on file  . Frequency of Social Gatherings with Friends and Family: Not on file  . Attends Religious Services: Not on file  . Active Member of Clubs or Organizations: Not on file  . Attends Programme researcher, broadcasting/film/video Meetings: Not on file  . Marital Status: Not on file  Intimate Partner Violence:   . Fear of Current or Ex-Partner: Not on file  . Emotionally Abused: Not on file  . Physically Abused: Not on file  . Sexually Abused: Not on file   Physical Exam Constitutional:  General: She is in acute distress.     Appearance: Normal appearance. She is obese.  HENT:     Head: Normocephalic and atraumatic.     Right Ear: Tympanic membrane normal.     Left Ear: Tympanic membrane normal.     Nose: Nose normal.     Mouth/Throat:     Mouth: Mucous membranes are dry.  Eyes:     Extraocular Movements: Extraocular movements intact.     Pupils: Pupils are equal, round, and reactive to light.  Cardiovascular:     Rate and Rhythm: Normal rate and regular rhythm.     Pulses: Normal pulses.     Heart sounds: Normal heart sounds.  Pulmonary:     Effort: Pulmonary effort is normal.     Breath sounds: Normal breath sounds.  Abdominal:     General: Abdomen is flat.     Palpations: Abdomen is soft.  Musculoskeletal:        General: Normal range of motion.     Cervical back: Normal range of motion.  Skin:    General: Skin is warm and dry.  Neurological:     General: No focal deficit present.     Mental Status: She is alert. Mental status is at baseline.     Cranial  Nerves: No cranial nerve deficit.     Motor: No weakness.     Gait: Gait abnormal.  Psychiatric:        Mood and Affect: Mood normal.        Thought Content: Thought content normal.        Judgment: Judgment normal.   To the or for wound exploration and debridement if necessary.

## 2020-12-05 NOTE — Anesthesia Postprocedure Evaluation (Signed)
Anesthesia Post Note  Patient: [name]  Procedure(s) Performed: Lumbar Wound Exploration (N/A Spine Lumbar)     Patient location during evaluation: PACU Anesthesia Type: General Level of consciousness: awake and alert Pain management: pain level controlled Vital Signs Assessment: post-procedure vital signs reviewed and stable Respiratory status: spontaneous breathing, nonlabored ventilation, respiratory function stable and patient connected to nasal cannula oxygen Cardiovascular status: blood pressure returned to baseline and stable Postop Assessment: no apparent nausea or vomiting Anesthetic complications: no   No complications documented.  Last Vitals:  Vitals:   12/05/20 1430 12/05/20 1451  BP: 133/79 (!) 160/101  Pulse: (!) 59 92  Resp: 10 18  Temp: 36.7 C 36.8 C  SpO2: 95% 96%    Last Pain:  Vitals:   12/05/20 1451  TempSrc: Oral  PainSc:                  [name] S

## 2020-12-05 NOTE — Transfer of Care (Signed)
Immediate Anesthesia Transfer of Care Note  Patient: [name]  Procedure(s) Performed: Lumbar Wound Exploration (N/A Spine Lumbar)  Patient Location: PACU  Anesthesia Type:General  Level of Consciousness: drowsy and patient cooperative  Airway & Oxygen Therapy: Patient Spontanous Breathing and Patient connected to face mask oxygen  Post-op Assessment: Report given to RN and Post -op Vital signs reviewed and stable  Post vital signs: Reviewed and stable  Last Vitals:  Vitals Value Taken Time  BP 122/81 12/05/20 1346  Temp 36.1 C 12/05/20 1315  Pulse 63 12/05/20 1359  Resp 12 12/05/20 1359  SpO2 100 % 12/05/20 1359  Vitals shown include unvalidated device data.  Last Pain:  Vitals:   12/05/20 1345  TempSrc:   PainSc: Asleep      Patients Stated Pain Goal: 2 (12/05/20 8891)  Complications: No complications documented.

## 2020-12-05 NOTE — Anesthesia Procedure Notes (Signed)
Procedure Name: Intubation Date/Time: 12/05/2020 12:14 PM Performed by: Modena Morrow, CRNA Pre-anesthesia Checklist: Patient identified, Emergency Drugs available, Suction available and Patient being monitored Patient Re-evaluated:Patient Re-evaluated prior to induction Oxygen Delivery Method: Circle system utilized Preoxygenation: Pre-oxygenation with 100% oxygen Induction Type: IV induction Ventilation: Mask ventilation without difficulty Laryngoscope Size: Glidescope and 4 Grade View: Grade I Tube type: Oral Tube size: 7.0 mm Number of attempts: 1 Airway Equipment and Method: Stylet and Oral airway Placement Confirmation: ETT inserted through vocal cords under direct vision,  positive ETCO2 and breath sounds checked- equal and bilateral Secured at: 21 cm Tube secured with: Tape Dental Injury: Teeth and Oropharynx as per pre-operative assessment

## 2020-12-06 ENCOUNTER — Encounter (HOSPITAL_COMMUNITY): Payer: Self-pay | Admitting: Neurosurgery

## 2020-12-06 DIAGNOSIS — M96842 Postprocedural seroma of a musculoskeletal structure following a musculoskeletal system procedure: Secondary | ICD-10-CM | POA: Diagnosis not present

## 2020-12-06 MED ORDER — OXYCODONE HCL 5 MG PO TABS: 5.0000 mg | ORAL_TABLET | Freq: Four times a day (QID) | ORAL | 0 refills | Status: AC | PRN

## 2020-12-06 MED ORDER — TIZANIDINE HCL 4 MG PO TABS: 4.0000 mg | ORAL_TABLET | Freq: Three times a day (TID) | ORAL | 0 refills | Status: DC | PRN

## 2020-12-06 NOTE — Discharge Instructions (Signed)

## 2020-12-06 NOTE — Progress Notes (Signed)
Pt doing well. Pt given D/C instructions with verbal understanding. Rx's were sent to the pharmacy by MD. Pt's incision has a minimal amount of drainage but no sign of infection. Pt's IV was removed prior to D/C. Pt D/C'd home via wheelchair per MD order. Pt is stable @ D/C and has no other needs at this time. Rema Fendt, RN

## 2020-12-06 NOTE — Op Note (Signed)
12/05/2020  6:26 PM  PATIENT:  [name]  59 y.o. female  PRE-OPERATIVE DIAGNOSIS:  Wound infection  POST-OPERATIVE DIAGNOSIS:  Wound seroma PROCEDURE:  Procedure(s): Lumbar Wound Exploration  SURGEON:   Surgeon(s): Coletta Memos, MD  ASSISTANTS:none  ANESTHESIA:   general  EBL:  No intake/output data recorded.  BLOOD ADMINISTERED:none  CELL SAVER GIVEN:none  COUNT:per nursing  DRAINS: none   SPECIMEN:  Source of Specimen:  wound  DICTATION: [name] was taken to the operating room, intubated and placed under a general anesthetic without difficulty. She was positioned prone on a Wilson frame with all pressure points padded. Her back was prepped and draped in a sterile manner. I opened the skin with a 10 blade and carried the dissection down to the thoracolumbar fascia. I used both sharp dissection and the monopolar cautery to open the wound. I encountered proteinaceous fluid, golden in color and clear. There was no purulence. The tissue inside the wound was healthy. She had some breakdown of the skin edges where the wound is within a large skin fold. I took cultures of the wound. I next irrigated the wound with approximately a litre of saline. I closed the wound in a single layer with interrupted vertical mattress sutures.  PLAN OF CARE: Admit for overnight observation  PATIENT DISPOSITION:  PACU - hemodynamically stable.   Delay start of Pharmacological VTE agent (>24hrs) due to surgical blood loss or risk of bleeding:  no

## 2020-12-06 NOTE — Discharge Summary (Signed)
Physician Discharge Summary  Patient ID: [name] MRN: [id number] DOB/AGE: 1961/07/03 59 y.o.  Admit date: 12/05/2020 Discharge date: 12/06/2020  Admission Diagnoses:wound infection  Discharge Diagnoses:  Active Problems:   Postoperative seroma of musculoskeletal structure after musculoskeletal procedure   Discharged Condition: good  Hospital Course: [name] was admitted and taken to the operating room for a wound exploration. What I thought was a possible wound infection was a simple seroma, possible breakdown product of a hematoma. No organisms, no purulence, cultures remain negative. CBC was normal also, no fever. The portion of the incision in a substantial skin fold was breaking down, but this did not herald an infection. She will be discharged voiding, ambulating, and tolerating a regular diet. The wound is clean and dry at discharge.   Treatments: surgery: as above  Discharge Exam: Blood pressure 103/73, pulse 92, temperature 97.7 F (36.5 C), temperature source Oral, resp. rate 16, height 5\' 3"  (1.6 m), weight 117.9 kg, SpO2 100 %. General appearance: alert, cooperative, appears stated age and mild distress  Disposition: Discharge disposition: 01-Home or Self Care      Wound infection  Allergies as of 12/06/2020   No Known Allergies     Medication List    TAKE these medications   acetaminophen 500 MG tablet Commonly known as: TYLENOL Take 1,000 mg by mouth every 8 (eight) hours as needed for moderate pain.   Aubagio 14 MG Tabs Generic drug: Teriflunomide Take 14 mg by mouth daily.   calcium-vitamin D 500-200 MG-UNIT tablet Commonly known as: OSCAL WITH D Take 1 tablet by mouth daily with breakfast.   gabapentin 300 MG capsule Commonly known as: NEURONTIN Take 300 mg by mouth 3 (three) times daily.   hydrochlorothiazide 25 MG tablet Commonly known as: HYDRODIURIL Take 25 mg by mouth daily.   HYDROcodone-acetaminophen 5-325 MG  tablet Commonly known as: NORCO/VICODIN Take 1 tablet by mouth every 6 (six) hours as needed for moderate pain.   losartan 100 MG tablet Commonly known as: COZAAR Take 100 mg by mouth daily.   modafinil 200 MG tablet Commonly known as: PROVIGIL Take 200 mg by mouth in the morning and at bedtime.   multivitamin with minerals tablet Take 1 tablet by mouth daily.   oxyCODONE 5 MG immediate release tablet Commonly known as: Oxy IR/ROXICODONE Take 1 tablet (5 mg total) by mouth every 6 (six) hours as needed for up to 8 days for moderate pain ((score 4 to 6)).   rosuvastatin 10 MG tablet Commonly known as: CRESTOR Take 10 mg by mouth at bedtime.   tiZANidine 4 MG tablet Commonly known as: ZANAFLEX Take 1 tablet (4 mg total) by mouth every 8 (eight) hours as needed for muscle spasms. What changed: when to take this   Vitamin D-3 125 MCG (5000 UT) Tabs Take 10,000 Units by mouth daily.       Follow-up Information    14/07/2020, MD Follow up in 2 week(s).   Specialty: Neurosurgery Why: for suture removal, please call to make an appointment Contact information: [address] [place] [phone]               Signed: [phone] 12/06/2020, 10:52 AM

## 2020-12-06 NOTE — Evaluation (Signed)
Physical Therapy Evaluation Patient Details Name: [name] MRN: [id number] DOB: 1961-11-17 Today's Date: 12/06/2020   History of Present Illness  59 yo female s/p lumbar wound exploration. Pt underwent lumbar decompression and arthrodesis on 11/02/2020. PMH including MS, high BP, and obesity.   Clinical Impression  Pt presents to PT s/p lumbar wound exploration after recent decompression and arthrodesis. Pt is able to perform all mobility required in the home setting at a modI level, ambulating for household distances with use of RW, no instances of loss of balance. Pt maintains back precautions well and performs log roll technique in bed. Pt is able to negotiate stairs without assistance at this time. Pt has no further acute PT needs at this time and is encouraged to ambulate out of the room multiple times a day for the remainder of her hospitalization. PT recommends continued HHPT services at the time of discharge to aide in a return to independent mobility without the use of an assistive device.    Follow Up Recommendations Home health PT    Equipment Recommendations  None recommended by PT    Recommendations for Other Services       Precautions / Restrictions Precautions Precautions: Back Precaution Booklet Issued: Yes (comment) Precaution Comments: reviewed back precautions, pt able to recall 3/3 Other Brace: No brace per MD order Restrictions Weight Bearing Restrictions: No      Mobility  Bed Mobility Overal bed mobility: Needs Assistance Bed Mobility: Rolling;Sidelying to Sit;Sit to Sidelying Rolling: Modified independent (Device/Increase time) Sidelying to sit: Modified independent (Device/Increase time)     Sit to sidelying: Modified independent (Device/Increase time)      Transfers Overall transfer level: Modified independent Equipment used: Rolling walker (2 wheeled) Transfers: Sit to/from Stand Sit to Stand: Modified independent (Device/Increase  time)            Ambulation/Gait Ambulation/Gait assistance: Modified independent (Device/Increase time) Gait Distance (Feet): 150 Feet Assistive device: Rolling walker (2 wheeled) Gait Pattern/deviations: Step-through pattern Gait velocity: reduced Gait velocity interpretation: <1.8 ft/sec, indicate of risk for recurrent falls General Gait Details: pt with slowed step-through gait, increased time for turns  Stairs Stairs: Yes Stairs assistance: Modified independent (Device/Increase time) Stair Management: One rail Right;One rail Left;Step to pattern;Forwards Number of Stairs: 3    Wheelchair Mobility    Modified Rankin (Stroke Patients Only)       Balance Overall balance assessment: Needs assistance Sitting-balance support: No upper extremity supported;Feet supported Sitting balance-Leahy Scale: Good     Standing balance support: No upper extremity supported Standing balance-Leahy Scale: Fair                               Pertinent Vitals/Pain Pain Assessment: 0-10 Pain Score: 2  Pain Location: back Pain Descriptors / Indicators: Sore Pain Intervention(s): Monitored during session    Home Living Family/patient expects to be discharged to:: Private residence Living Arrangements: Alone Available Help at Discharge: Friend(s);Family;Available PRN/intermittently Type of Home: House Home Access: Stairs to enter Entrance Stairs-Rails: Doctor, general practice of Steps: 3 Home Layout: One level Home Equipment: Cane - single point;Walker - 2 wheels      Prior Function Level of Independence: Independent with assistive device(s)         Comments: pt ambulating with cane/RW since original surgery, does not utilize assistive devices at baseline     Hand Dominance   Dominant Hand: Right    Extremity/Trunk Assessment  Upper Extremity Assessment Upper Extremity Assessment: Overall WFL for tasks assessed    Lower Extremity  Assessment Lower Extremity Assessment: Overall WFL for tasks assessed    Cervical / Trunk Assessment Cervical / Trunk Assessment: Other exceptions Cervical / Trunk Exceptions: s/p back sx  Communication   Communication: No difficulties  Cognition Arousal/Alertness: Awake/alert Behavior During Therapy: WFL for tasks assessed/performed Overall Cognitive Status: Within Functional Limits for tasks assessed                                        General Comments General comments (skin integrity, edema, etc.): VSS on RA    Exercises     Assessment/Plan    PT Assessment Patent does not need any further PT services  PT Problem List         PT Treatment Interventions      PT Goals (Current goals can be found in the Care Plan section)       Frequency     Barriers to discharge        Co-evaluation               AM-PAC PT "6 Clicks" Mobility  Outcome Measure Help needed turning from your back to your side while in a flat bed without using bedrails?: None Help needed moving from lying on your back to sitting on the side of a flat bed without using bedrails?: None Help needed moving to and from a bed to a chair (including a wheelchair)?: None Help needed standing up from a chair using your arms (e.g., wheelchair or bedside chair)?: None Help needed to walk in hospital room?: None Help needed climbing 3-5 steps with a railing? : None 6 Click Score: 24    End of Session   Activity Tolerance: Patient tolerated treatment well Patient left: in bed;with call bell/phone within reach Nurse Communication: Mobility status      Time: 2831-5176 PT Time Calculation (min) (ACUTE ONLY): 14 min   Charges:   PT Evaluation $PT Eval Low Complexity: 1 Low          Arlyss Gandy, PT, DPT Acute Rehabilitation Pager: [phone]   Arlyss Gandy 12/06/2020, 9:24 AM

## 2020-12-06 NOTE — TOC Transition Note (Signed)
Transition of Care [facility]) - CM/SW Discharge Note   Patient Details  Name: [name] MRN: [id number] Date of Birth: Jun 19, 1961  [facility] CM/SW Contact:  Lawerance Sabal, RN Phone Number: 12/06/2020, 8:24 AM   Clinical Narrative:   Patient is active w [facility]. Patient is in observation and therefore no HH orders are needed. Services will continue as established prior to admission. No DME needs.     Final next level of care: Home w Home Health Services Barriers to Discharge: No Barriers Identified   Patient Goals and CMS Choice Patient states their goals for this hospitalization and ongoing recovery are:: to go home CMS Medicare.gov Compare Post Acute Care list provided to:: Patient Choice offered to / list presented to : Patient  Discharge Placement                       Discharge Plan and Services                          HH Arranged: PT, OT HH Agency: [facility]        Social Determinants of Health (SDOH) Interventions     Readmission Risk Interventions No flowsheet data found.

## 2020-12-10 LAB — AEROBIC/ANAEROBIC CULTURE W GRAM STAIN (SURGICAL/DEEP WOUND)
Culture: NORMAL
Gram Stain: NONE SEEN

## 2021-02-20 ENCOUNTER — Other Ambulatory Visit: Payer: Self-pay | Admitting: Neurosurgery

## 2021-02-20 ENCOUNTER — Other Ambulatory Visit: Payer: Self-pay

## 2021-02-20 NOTE — Progress Notes (Signed)
[name] denies chest pain or shortness of breath.  Patient denies s/s of Covid, patient will be tested on arrival.

## 2021-02-21 ENCOUNTER — Ambulatory Visit (HOSPITAL_COMMUNITY): Payer: 59

## 2021-02-21 ENCOUNTER — Other Ambulatory Visit: Payer: Self-pay

## 2021-02-21 ENCOUNTER — Encounter (HOSPITAL_COMMUNITY): Payer: Self-pay | Admitting: Neurosurgery

## 2021-02-21 ENCOUNTER — Encounter (HOSPITAL_COMMUNITY)
Admission: RE | Disposition: A | Discharge status: Home / Self Care | Payer: Self-pay | Source: Home / Self Care | Attending: Neurosurgery

## 2021-02-21 ENCOUNTER — Observation Stay (HOSPITAL_COMMUNITY)
Admission: RE | Admit: 2021-02-21 | Discharge: 2021-02-22 | Disposition: A | Discharge status: Home / Self Care | Payer: 59 | Attending: Neurosurgery | Admitting: Neurosurgery

## 2021-02-21 DIAGNOSIS — L24A9 Irritant contact dermatitis due friction or contact with other specified body fluids: Secondary | ICD-10-CM | POA: Diagnosis present

## 2021-02-21 DIAGNOSIS — T8149XA Infection following a procedure, other surgical site, initial encounter: Secondary | ICD-10-CM | POA: Diagnosis present

## 2021-02-21 DIAGNOSIS — M7989 Other specified soft tissue disorders: Secondary | ICD-10-CM | POA: Diagnosis not present

## 2021-02-21 DIAGNOSIS — T148XXA Other injury of unspecified body region, initial encounter: Secondary | ICD-10-CM | POA: Diagnosis present

## 2021-02-21 DIAGNOSIS — T8130XA Disruption of wound, unspecified, initial encounter: Secondary | ICD-10-CM | POA: Diagnosis present

## 2021-02-21 DIAGNOSIS — Z20822 Contact with and (suspected) exposure to covid-19: Secondary | ICD-10-CM | POA: Diagnosis not present

## 2021-02-21 DIAGNOSIS — T8132XA Disruption of internal operation (surgical) wound, not elsewhere classified, initial encounter: Secondary | ICD-10-CM | POA: Insufficient documentation

## 2021-02-21 HISTORY — PX: LUMBAR WOUND DEBRIDEMENT: SHX1988

## 2021-02-21 LAB — CREATININE, SERUM
Creatinine, Ser: 0.87 mg/dL (ref 0.44–1.00)
GFR, Estimated: >60 mL/min (ref >60)

## 2021-02-21 LAB — CBC
HCT: 30.1 % — ABNORMAL LOW (ref 36.0–46.0)
Hemoglobin: 9.7 g/dL — ABNORMAL LOW (ref 12.0–15.0)
MCH: 28.5 pg (ref 26.0–34.0)
MCHC: 32.2 g/dL (ref 30.0–36.0)
MCV: 88.5 fL (ref 80.0–100.0)
Platelets: 244 10*3/uL (ref 150–400)
RBC: 3.4 MIL/uL — ABNORMAL LOW (ref 3.87–5.11)
RDW: 13.3 % (ref 11.5–15.5)
WBC: 6.2 10*3/uL (ref 4.0–10.5)
nRBC: 0 % (ref 0.0–0.2)

## 2021-02-21 LAB — POCT I-STAT, CHEM 8
BUN: 20 mg/dL (ref 8–23)
Calcium, Ion: 0.92 mmol/L — ABNORMAL LOW (ref 1.15–1.40)
Chloride: 106 mmol/L (ref 98–111)
Creatinine, Ser: 0.7 mg/dL (ref 0.44–1.00)
Glucose, Bld: 93 mg/dL (ref 70–99)
HCT: 33 % — ABNORMAL LOW (ref 36.0–46.0)
Hemoglobin: 11.2 g/dL — ABNORMAL LOW (ref 12.0–15.0)
Potassium: 3.8 mmol/L (ref 3.5–5.1)
Sodium: 139 mmol/L (ref 135–145)
TCO2: 24 mmol/L (ref 22–32)

## 2021-02-21 LAB — SARS CORONAVIRUS 2 BY RT PCR (HOSPITAL ORDER, PERFORMED IN ~~LOC~~ HOSPITAL LAB): SARS Coronavirus 2: NEGATIVE

## 2021-02-21 SURGERY — LUMBAR WOUND DEBRIDEMENT
Anesthesia: General | Site: Spine Lumbar

## 2021-02-21 MED ORDER — LOSARTAN POTASSIUM 50 MG PO TABS
100.0000 mg | ORAL_TABLET | Freq: Every day | ORAL | Status: DC
  Administered 2021-02-21 – 2021-02-22 (×2): 100 mg via ORAL
  Filled 2021-02-21 (×2): qty 2

## 2021-02-21 MED ORDER — OXYCODONE HCL 5 MG PO TABS
ORAL_TABLET | ORAL | Status: AC
  Filled 2021-02-21: qty 1

## 2021-02-21 MED ORDER — KETOROLAC TROMETHAMINE 15 MG/ML IJ SOLN
15.0000 mg | Freq: Four times a day (QID) | INTRAMUSCULAR | Status: DC
  Administered 2021-02-21 – 2021-02-22 (×3): 15 mg via INTRAVENOUS
  Filled 2021-02-21 (×3): qty 1

## 2021-02-21 MED ORDER — KETOROLAC TROMETHAMINE 30 MG/ML IJ SOLN
30.0000 mg | Freq: Once | INTRAMUSCULAR | Status: AC
  Administered 2021-02-21: 30 mg via INTRAVENOUS

## 2021-02-21 MED ORDER — EPHEDRINE 5 MG/ML INJ
INTRAVENOUS | Status: AC
  Filled 2021-02-21: qty 10

## 2021-02-21 MED ORDER — PHENOL 1.4 % MT LIQD: 1.0000 | OROMUCOSAL | Status: DC | PRN

## 2021-02-21 MED ORDER — VITAMIN D 25 MCG (1000 UNIT) PO TABS
5000.0000 [IU] | ORAL_TABLET | Freq: Every day | ORAL | Status: DC
  Administered 2021-02-21 – 2021-02-22 (×2): 5000 [IU] via ORAL
  Filled 2021-02-21 (×2): qty 5

## 2021-02-21 MED ORDER — MIDAZOLAM HCL 5 MG/5ML IJ SOLN
INTRAMUSCULAR | Status: DC | PRN
  Administered 2021-02-21: 2 mg via INTRAVENOUS

## 2021-02-21 MED ORDER — FENTANYL CITRATE (PF) 250 MCG/5ML IJ SOLN
INTRAMUSCULAR | Status: DC | PRN
  Administered 2021-02-21: 50 ug via INTRAVENOUS
  Administered 2021-02-21: 100 ug via INTRAVENOUS

## 2021-02-21 MED ORDER — OXYCODONE HCL 5 MG PO TABS
10.0000 mg | ORAL_TABLET | ORAL | Status: DC | PRN
  Administered 2021-02-22 (×3): 10 mg via ORAL
  Filled 2021-02-21 (×3): qty 2

## 2021-02-21 MED ORDER — THROMBIN 5000 UNITS EX SOLR
CUTANEOUS | Status: AC
  Filled 2021-02-21: qty 5000

## 2021-02-21 MED ORDER — TIZANIDINE HCL 4 MG PO TABS
4.0000 mg | ORAL_TABLET | Freq: Three times a day (TID) | ORAL | Status: DC | PRN
  Administered 2021-02-21: 4 mg via ORAL
  Filled 2021-02-21: qty 1

## 2021-02-21 MED ORDER — LIDOCAINE 2% (20 MG/ML) 5 ML SYRINGE
INTRAMUSCULAR | Status: AC
  Filled 2021-02-21: qty 5

## 2021-02-21 MED ORDER — ONDANSETRON HCL 4 MG/2ML IJ SOLN
INTRAMUSCULAR | Status: DC | PRN
  Administered 2021-02-21: 4 mg via INTRAVENOUS

## 2021-02-21 MED ORDER — BACITRACIN ZINC 500 UNIT/GM EX OINT
TOPICAL_OINTMENT | CUTANEOUS | Status: DC | PRN
  Administered 2021-02-21: 1 via TOPICAL

## 2021-02-21 MED ORDER — ALBUTEROL SULFATE HFA 108 (90 BASE) MCG/ACT IN AERS
INHALATION_SPRAY | RESPIRATORY_TRACT | Status: AC
  Filled 2021-02-21: qty 6.7

## 2021-02-21 MED ORDER — MIDAZOLAM HCL 2 MG/2ML IJ SOLN
INTRAMUSCULAR | Status: AC
  Filled 2021-02-21: qty 2

## 2021-02-21 MED ORDER — SUGAMMADEX SODIUM 500 MG/5ML IV SOLN
INTRAVENOUS | Status: AC
  Filled 2021-02-21: qty 5

## 2021-02-21 MED ORDER — ONDANSETRON HCL 4 MG/2ML IJ SOLN
INTRAMUSCULAR | Status: AC
  Filled 2021-02-21: qty 2

## 2021-02-21 MED ORDER — BACITRACIN ZINC 500 UNIT/GM EX OINT
TOPICAL_OINTMENT | CUTANEOUS | Status: AC
  Filled 2021-02-21: qty 28.35

## 2021-02-21 MED ORDER — KETOROLAC TROMETHAMINE 30 MG/ML IJ SOLN
INTRAMUSCULAR | Status: AC
  Filled 2021-02-21: qty 1

## 2021-02-21 MED ORDER — HEPARIN SODIUM (PORCINE) 5000 UNIT/ML IJ SOLN
5000.0000 [IU] | Freq: Three times a day (TID) | INTRAMUSCULAR | Status: DC
  Administered 2021-02-22: 5000 [IU] via SUBCUTANEOUS
  Filled 2021-02-21: qty 1

## 2021-02-21 MED ORDER — HYDROMORPHONE HCL 1 MG/ML IJ SOLN
0.2500 mg | INTRAMUSCULAR | Status: DC | PRN
  Administered 2021-02-21 (×4): 0.5 mg via INTRAVENOUS

## 2021-02-21 MED ORDER — GABAPENTIN 300 MG PO CAPS
300.0000 mg | ORAL_CAPSULE | Freq: Two times a day (BID) | ORAL | Status: DC
  Administered 2021-02-21 – 2021-02-22 (×2): 300 mg via ORAL
  Filled 2021-02-21 (×2): qty 1

## 2021-02-21 MED ORDER — ALBUTEROL SULFATE HFA 108 (90 BASE) MCG/ACT IN AERS
INHALATION_SPRAY | RESPIRATORY_TRACT | Status: DC | PRN
  Administered 2021-02-21 (×2): 2 via RESPIRATORY_TRACT

## 2021-02-21 MED ORDER — CHLORHEXIDINE GLUCONATE 0.12 % MT SOLN
15.0000 mL | Freq: Once | OROMUCOSAL | Status: AC
  Administered 2021-02-21: 15 mL via OROMUCOSAL
  Filled 2021-02-21: qty 15

## 2021-02-21 MED ORDER — HYDROMORPHONE HCL 1 MG/ML IJ SOLN
INTRAMUSCULAR | Status: AC
  Filled 2021-02-21: qty 1

## 2021-02-21 MED ORDER — DEXAMETHASONE SODIUM PHOSPHATE 10 MG/ML IJ SOLN
INTRAMUSCULAR | Status: AC
  Filled 2021-02-21: qty 1

## 2021-02-21 MED ORDER — POTASSIUM CHLORIDE IN NACL 20-0.9 MEQ/L-% IV SOLN: INTRAVENOUS | Status: DC

## 2021-02-21 MED ORDER — DEXTROSE 5 % IV SOLN
INTRAVENOUS | Status: DC | PRN
  Administered 2021-02-21: 3 g via INTRAVENOUS

## 2021-02-21 MED ORDER — SODIUM CHLORIDE 0.9 % IV SOLN: 250.0000 mL | INTRAVENOUS | Status: DC

## 2021-02-21 MED ORDER — DEXAMETHASONE SODIUM PHOSPHATE 10 MG/ML IJ SOLN
INTRAMUSCULAR | Status: DC | PRN
  Administered 2021-02-21: 4 mg via INTRAVENOUS

## 2021-02-21 MED ORDER — PROPOFOL 10 MG/ML IV BOLUS
INTRAVENOUS | Status: DC | PRN
  Administered 2021-02-21: 200 mg via INTRAVENOUS

## 2021-02-21 MED ORDER — TERIFLUNOMIDE 14 MG PO TABS: 14.0000 mg | ORAL_TABLET | Freq: Every day | ORAL | Status: DC

## 2021-02-21 MED ORDER — SENNOSIDES-DOCUSATE SODIUM 8.6-50 MG PO TABS: 1.0000 | ORAL_TABLET | Freq: Every evening | ORAL | Status: DC | PRN

## 2021-02-21 MED ORDER — OXYCODONE HCL 5 MG PO TABS
5.0000 mg | ORAL_TABLET | Freq: Once | ORAL | Status: AC | PRN
  Administered 2021-02-21: 5 mg via ORAL

## 2021-02-21 MED ORDER — MODAFINIL 100 MG PO TABS
200.0000 mg | ORAL_TABLET | Freq: Two times a day (BID) | ORAL | Status: DC
  Administered 2021-02-22 (×2): 200 mg via ORAL
  Filled 2021-02-21 (×2): qty 2

## 2021-02-21 MED ORDER — PHENYLEPHRINE 40 MCG/ML (10ML) SYRINGE FOR IV PUSH (FOR BLOOD PRESSURE SUPPORT)
PREFILLED_SYRINGE | INTRAVENOUS | Status: AC
  Filled 2021-02-21: qty 20

## 2021-02-21 MED ORDER — SUGAMMADEX SODIUM 200 MG/2ML IV SOLN
INTRAVENOUS | Status: DC | PRN
  Administered 2021-02-21: 250 mg via INTRAVENOUS

## 2021-02-21 MED ORDER — PROMETHAZINE HCL 25 MG/ML IJ SOLN: 6.2500 mg | INTRAMUSCULAR | Status: DC | PRN

## 2021-02-21 MED ORDER — ROCURONIUM BROMIDE 10 MG/ML (PF) SYRINGE
PREFILLED_SYRINGE | INTRAVENOUS | Status: DC | PRN
  Administered 2021-02-21: 100 mg via INTRAVENOUS

## 2021-02-21 MED ORDER — LIDOCAINE 2% (20 MG/ML) 5 ML SYRINGE
INTRAMUSCULAR | Status: DC | PRN
  Administered 2021-02-21: 60 mg via INTRAVENOUS

## 2021-02-21 MED ORDER — OXYCODONE HCL 5 MG PO TABS: 5.0000 mg | ORAL_TABLET | ORAL | Status: DC | PRN

## 2021-02-21 MED ORDER — HYDROCODONE-ACETAMINOPHEN 7.5-325 MG PO TABS
1.0000 | ORAL_TABLET | Freq: Four times a day (QID) | ORAL | Status: DC
  Administered 2021-02-21 – 2021-02-22 (×3): 1 via ORAL
  Filled 2021-02-21 (×3): qty 1

## 2021-02-21 MED ORDER — SODIUM CHLORIDE 0.9% FLUSH: 3.0000 mL | INTRAVENOUS | Status: DC | PRN

## 2021-02-21 MED ORDER — ONDANSETRON HCL 4 MG PO TABS: 4.0000 mg | ORAL_TABLET | Freq: Four times a day (QID) | ORAL | Status: DC | PRN

## 2021-02-21 MED ORDER — VANCOMYCIN HCL 1000 MG IV SOLR
INTRAVENOUS | Status: AC
  Filled 2021-02-21: qty 1000

## 2021-02-21 MED ORDER — HYDROCHLOROTHIAZIDE 25 MG PO TABS
25.0000 mg | ORAL_TABLET | Freq: Every day | ORAL | Status: DC
Start: 2021-02-21 — End: 2021-02-22
  Administered 2021-02-21: 25 mg via ORAL
  Filled 2021-02-21 (×2): qty 1

## 2021-02-21 MED ORDER — LACTATED RINGERS IV SOLN: INTRAVENOUS | Status: DC

## 2021-02-21 MED ORDER — FENTANYL CITRATE (PF) 250 MCG/5ML IJ SOLN
INTRAMUSCULAR | Status: AC
  Filled 2021-02-21: qty 5

## 2021-02-21 MED ORDER — HYDROMORPHONE HCL 1 MG/ML IJ SOLN: 1.0000 mg | INTRAMUSCULAR | Status: DC | PRN

## 2021-02-21 MED ORDER — BISACODYL 5 MG PO TBEC: 5.0000 mg | DELAYED_RELEASE_TABLET | Freq: Every day | ORAL | Status: DC | PRN

## 2021-02-21 MED ORDER — ADULT MULTIVITAMIN W/MINERALS CH
1.0000 | ORAL_TABLET | Freq: Every day | ORAL | Status: DC
  Administered 2021-02-21 – 2021-02-22 (×2): 1 via ORAL
  Filled 2021-02-21 (×2): qty 1

## 2021-02-21 MED ORDER — OXYCODONE HCL 5 MG/5ML PO SOLN
5.0000 mg | Freq: Once | ORAL | Status: AC | PRN
Start: 2021-02-21 — End: 2021-02-21

## 2021-02-21 MED ORDER — ACETAMINOPHEN 325 MG PO TABS: 650.0000 mg | ORAL_TABLET | ORAL | Status: DC | PRN

## 2021-02-21 MED ORDER — SENNA 8.6 MG PO TABS
1.0000 | ORAL_TABLET | Freq: Two times a day (BID) | ORAL | Status: DC
  Administered 2021-02-21 – 2021-02-22 (×2): 8.6 mg via ORAL
  Filled 2021-02-21 (×2): qty 1

## 2021-02-21 MED ORDER — ONDANSETRON HCL 4 MG/2ML IJ SOLN: 4.0000 mg | Freq: Four times a day (QID) | INTRAMUSCULAR | Status: DC | PRN

## 2021-02-21 MED ORDER — CEFAZOLIN SODIUM 1 G IJ SOLR
INTRAMUSCULAR | Status: AC
  Filled 2021-02-21: qty 30

## 2021-02-21 MED ORDER — ASCORBIC ACID 500 MG PO TABS
500.0000 mg | ORAL_TABLET | Freq: Every day | ORAL | Status: DC
  Administered 2021-02-21 – 2021-02-22 (×2): 500 mg via ORAL
  Filled 2021-02-21 (×2): qty 1

## 2021-02-21 MED ORDER — ZOLPIDEM TARTRATE 5 MG PO TABS: 5.0000 mg | ORAL_TABLET | Freq: Every evening | ORAL | Status: DC | PRN

## 2021-02-21 MED ORDER — SODIUM CHLORIDE 0.9% FLUSH
3.0000 mL | Freq: Two times a day (BID) | INTRAVENOUS | Status: DC
  Administered 2021-02-21 – 2021-02-22 (×2): 3 mL via INTRAVENOUS

## 2021-02-21 MED ORDER — MENTHOL 3 MG MT LOZG: 1.0000 | LOZENGE | OROMUCOSAL | Status: DC | PRN

## 2021-02-21 MED ORDER — 0.9 % SODIUM CHLORIDE (POUR BTL) OPTIME
TOPICAL | Status: DC | PRN
Start: 2021-02-21 — End: 2021-02-21
  Administered 2021-02-21: 1000 mL

## 2021-02-21 MED ORDER — ROSUVASTATIN CALCIUM 5 MG PO TABS
10.0000 mg | ORAL_TABLET | Freq: Every day | ORAL | Status: DC
  Administered 2021-02-21: 10 mg via ORAL
  Filled 2021-02-21: qty 2

## 2021-02-21 MED ORDER — ORAL CARE MOUTH RINSE: 15.0000 mL | Freq: Once | OROMUCOSAL | Status: AC

## 2021-02-21 MED ORDER — ACETAMINOPHEN 650 MG RE SUPP: 650.0000 mg | RECTAL | Status: DC | PRN

## 2021-02-21 SURGICAL SUPPLY — 58 items
BENZOIN TINCTURE PRP APPL 2/3 (GAUZE/BANDAGES/DRESSINGS) IMPLANT
BLADE CLIPPER SURG (BLADE) IMPLANT
CANISTER SUCT 3000ML PPV (MISCELLANEOUS) ×2 IMPLANT
CARTRIDGE OIL MAESTRO DRILL (MISCELLANEOUS) IMPLANT
COVER WAND RF STERILE (DRAPES) IMPLANT
DECANTER SPIKE VIAL GLASS SM (MISCELLANEOUS) IMPLANT
DIFFUSER DRILL AIR PNEUMATIC (MISCELLANEOUS) IMPLANT
DRAPE LAPAROTOMY 100X72X124 (DRAPES) ×2 IMPLANT
DRAPE SURG 17X23 STRL (DRAPES) ×2 IMPLANT
DRSG OPSITE POSTOP 4X6 (GAUZE/BANDAGES/DRESSINGS) ×2 IMPLANT
DURAPREP 26ML APPLICATOR (WOUND CARE) ×2 IMPLANT
ELECT REM PT RETURN 9FT ADLT (ELECTROSURGICAL) ×2
ELECTRODE REM PT RTRN 9FT ADLT (ELECTROSURGICAL) ×1 IMPLANT
GAUZE 4X4 16PLY RFD (DISPOSABLE) IMPLANT
GAUZE SPONGE 4X4 12PLY STRL (GAUZE/BANDAGES/DRESSINGS) IMPLANT
GLOVE BIO SURGEON STRL SZ 6.5 (GLOVE) IMPLANT
GLOVE BIO SURGEON STRL SZ7 (GLOVE) IMPLANT
GLOVE BIO SURGEON STRL SZ7.5 (GLOVE) IMPLANT
GLOVE BIO SURGEON STRL SZ8 (GLOVE) IMPLANT
GLOVE BIO SURGEON STRL SZ8.5 (GLOVE) IMPLANT
GLOVE BIOGEL M 8.0 STRL (GLOVE) IMPLANT
GLOVE ECLIPSE 6.5 STRL STRAW (GLOVE) IMPLANT
GLOVE ECLIPSE 7.0 STRL STRAW (GLOVE) IMPLANT
GLOVE ECLIPSE 7.5 STRL STRAW (GLOVE) IMPLANT
GLOVE ECLIPSE 8.0 STRL XLNG CF (GLOVE) IMPLANT
GLOVE ECLIPSE 8.5 STRL (GLOVE) IMPLANT
GLOVE EXAM NITRILE XL STR (GLOVE) IMPLANT
GLOVE INDICATOR 7.0 STRL GRN (GLOVE) IMPLANT
GLOVE INDICATOR 7.5 STRL GRN (GLOVE) IMPLANT
GLOVE INDICATOR 8.0 STRL GRN (GLOVE) IMPLANT
GLOVE INDICATOR 8.5 STRL (GLOVE) IMPLANT
GLOVE OPTIFIT SS 8.0 STRL (GLOVE) IMPLANT
GLOVE SURG SS PI 6.5 STRL IVOR (GLOVE) IMPLANT
GLOVE SURG UNDER LTX SZ6.5 (GLOVE) IMPLANT
GOWN STRL REUS W/ TWL LRG LVL3 (GOWN DISPOSABLE) ×3 IMPLANT
GOWN STRL REUS W/ TWL XL LVL3 (GOWN DISPOSABLE) IMPLANT
GOWN STRL REUS W/TWL 2XL LVL3 (GOWN DISPOSABLE) IMPLANT
GOWN STRL REUS W/TWL LRG LVL3 (GOWN DISPOSABLE) ×3
GOWN STRL REUS W/TWL XL LVL3 (GOWN DISPOSABLE)
KIT BASIN OR (CUSTOM PROCEDURE TRAY) ×2 IMPLANT
KIT TURNOVER KIT B (KITS) ×2 IMPLANT
NS IRRIG 1000ML POUR BTL (IV SOLUTION) ×2 IMPLANT
OIL CARTRIDGE MAESTRO DRILL (MISCELLANEOUS)
PACK LAMINECTOMY NEURO (CUSTOM PROCEDURE TRAY) ×2 IMPLANT
PAD ARMBOARD 7.5X6 YLW CONV (MISCELLANEOUS) ×10 IMPLANT
SPONGE LAP 4X18 RFD (DISPOSABLE) IMPLANT
SPONGE SURGIFOAM ABS GEL SZ50 (HEMOSTASIS) IMPLANT
STRIP CLOSURE SKIN 1/2X4 (GAUZE/BANDAGES/DRESSINGS) IMPLANT
SUT ETHILON 3 0 FSL (SUTURE) ×2 IMPLANT
SUT VIC AB 0 CT1 18XCR BRD8 (SUTURE) ×1 IMPLANT
SUT VIC AB 0 CT1 8-18 (SUTURE) ×1
SUT VIC AB 2-0 CT1 18 (SUTURE) ×2 IMPLANT
SUT VIC AB 3-0 SH 8-18 (SUTURE) ×2 IMPLANT
SWAB COLLECTION DEVICE MRSA (MISCELLANEOUS) ×2 IMPLANT
SWAB CULTURE ESWAB REG 1ML (MISCELLANEOUS) ×2 IMPLANT
TOWEL GREEN STERILE (TOWEL DISPOSABLE) ×2 IMPLANT
TOWEL GREEN STERILE FF (TOWEL DISPOSABLE) ×2 IMPLANT
WATER STERILE IRR 1000ML POUR (IV SOLUTION) ×2 IMPLANT

## 2021-02-21 NOTE — Transfer of Care (Signed)
Immediate Anesthesia Transfer of Care Note  Patient: [name]  Procedure(s) Performed: Lumbar wound exploration (N/A Spine Lumbar)  Patient Location: PACU  Anesthesia Type:General  Level of Consciousness: drowsy  Airway & Oxygen Therapy: Patient Spontanous Breathing and Patient connected to nasal cannula oxygen  Post-op Assessment: Report given to RN and Post -op Vital signs reviewed and stable  Post vital signs: Reviewed and stable  Last Vitals:  Vitals Value Taken Time  BP 170/103 02/21/21 1418  Temp    Pulse 78 02/21/21 1421  Resp 15 02/21/21 1421  SpO2 100 % 02/21/21 1421  Vitals shown include unvalidated device data.  Last Pain:  Vitals:   02/21/21 0942  TempSrc:   PainSc: 6          Complications: No complications documented.

## 2021-02-21 NOTE — Op Note (Signed)
02/21/2021  2:34 PM  PATIENT:  [name]  60 y.o. female  PRE-OPERATIVE DIAGNOSIS:  Wound drainage  POST-OPERATIVE DIAGNOSIS:  Wound Drainage  PROCEDURE:  Procedure(s): Lumbar wound exploration  SURGEON: Surgeon(s): Coletta Memos, MD  ASSISTANTS:none  ANESTHESIA:   general  EBL:  Total I/O In: 900 [I.V.:800; Other:50; IV Piggyback:50] Out: 20 [Blood:20]  BLOOD ADMINISTERED:none  CELL SAVER GIVEN:none  COUNT:per nursing  DRAINS: none   SPECIMEN:  Source of Specimen:  wound  DICTATION: [name] was taken to the operating room, intubated, and placed under a general anesthetic without difficulty. She was positioned prone on a wilson frame with all pressure points padded. She had her lumbar region prepped and draped in a sterile manner.  I placed a nerve hook into a small skin opening and wound up in a large empty space. I then opened the incision and exposed this cavity along with the same golden colored fluid from her previous exploration. I had the anesthetist perform a valsalva and did not appreciate any spinal fluid. There was no breech of the dura during the initial case. I did send specimens from the subcutaneous layer. The fascia was not opened and was intact. I approximated the subcutaneous tissue, resected the sinus tract, then closed the skin with nylon sutures. I applied a sterile dressing. She was extubated and taken to the PACU.   PLAN OF CARE: Discharge to home after PACU  PATIENT DISPOSITION:  PACU - hemodynamically stable.   Delay start of Pharmacological VTE agent (>24hrs) due to surgical blood loss or risk of bleeding:  no

## 2021-02-21 NOTE — Anesthesia Postprocedure Evaluation (Signed)
Anesthesia Post Note  Patient: [name]  Procedure(s) Performed: Lumbar wound exploration (N/A Spine Lumbar)     Patient location during evaluation: PACU Anesthesia Type: General Level of consciousness: awake and alert, oriented and patient cooperative Pain management: pain level controlled Vital Signs Assessment: post-procedure vital signs reviewed and stable Respiratory status: spontaneous breathing, nonlabored ventilation and respiratory function stable Cardiovascular status: blood pressure returned to baseline and stable Postop Assessment: no apparent nausea or vomiting Anesthetic complications: no   No complications documented.  Last Vitals:  Vitals:   02/21/21 0926  BP: 133/82  Pulse: 77  Resp: 20  Temp: 36.6 C  SpO2: 100%    Last Pain:  Vitals:   02/21/21 0942  TempSrc:   PainSc: 6                  Lannie Fields

## 2021-02-21 NOTE — Anesthesia Preprocedure Evaluation (Signed)
Anesthesia Evaluation    Reviewed: Allergy & Precautions, Patient's Chart, lab work & pertinent test results  Airway Mallampati: II  TM Distance: >3 FB Neck ROM: Full    Dental no notable dental hx. (+) Teeth Intact, Dental Advisory Given, Missing,    Pulmonary neg pulmonary ROS,    Pulmonary exam normal breath sounds clear to auscultation       Cardiovascular hypertension, Pt. on medications Normal cardiovascular exam Rhythm:Regular Rate:Normal     Neuro/Psych  Neuromuscular disease (multiple sclerosis) negative psych ROS   GI/Hepatic negative GI ROS, Neg liver ROS,   Endo/Other  Morbid obesityBMI 47  Renal/GU negative Renal ROS  negative genitourinary   Musculoskeletal  (+) Arthritis , Osteoarthritis,  Chronic LBP Lumbar wound drainage s/p multiple lumbar surgeries    Abdominal (+) + obese,   Peds  Hematology negative hematology ROS (+)   Anesthesia Other Findings   Reproductive/Obstetrics negative OB ROS                             Anesthesia Physical Anesthesia Plan  ASA: III  Anesthesia Plan: General   Post-op Pain Management:    Induction: Intravenous  PONV Risk Score and Plan: 3 and Ondansetron, Dexamethasone, Midazolam and Treatment may vary due to age or medical condition  Airway Management Planned: Oral ETT  Additional Equipment: None  Intra-op Plan:   Post-operative Plan: Extubation in OR  Informed Consent: I have reviewed the patients History and Physical, chart, labs and discussed the procedure including the risks, benefits and alternatives for the proposed anesthesia with the patient or authorized representative who has indicated his/her understanding and acceptance.     Dental advisory given  Plan Discussed with: CRNA  Anesthesia Plan Comments:         Anesthesia Quick Evaluation

## 2021-02-21 NOTE — Discharge Instructions (Signed)
You may shower tomorrow. The dressing can get wet, just pat dry afterwards Call if temperature greater than 101.5, if the wound drains,or becomes very tender/red

## 2021-02-21 NOTE — Anesthesia Procedure Notes (Signed)
Procedure Name: Intubation Date/Time: 02/21/2021 1:00 PM Performed by: Elliot Dally, CRNA Pre-anesthesia Checklist: Patient identified, Emergency Drugs available, Suction available and Patient being monitored Patient Re-evaluated:Patient Re-evaluated prior to induction Oxygen Delivery Method: Circle System Utilized Preoxygenation: Pre-oxygenation with 100% oxygen Induction Type: IV induction Ventilation: Mask ventilation without difficulty Laryngoscope Size: Glidescope and 3 Grade View: Grade I Tube type: Oral Tube size: 7.0 mm Number of attempts: 1 Airway Equipment and Method: Stylet and Oral airway Placement Confirmation: ETT inserted through vocal cords under direct vision,  positive ETCO2 and breath sounds checked- equal and bilateral Secured at: 22 cm Tube secured with: Tape Dental Injury: Teeth and Oropharynx as per pre-operative assessment  Comments: Elective glide scope based on history of difficult intubation.

## 2021-02-21 NOTE — H&P (Signed)
[name] is a 60 y.o. female With a persistent area of drainage from her wound. I explored this previously believing that it was and infection. It was not, but the material was proteinaceous, golden and clear. The wound healed and she did well until last week when she started draining just along side the incision.  No Known Allergies Past Medical History:  Diagnosis Date  . Back pain   . Chronic knee pain   . Edema of both lower extremities   . High blood pressure   . High cholesterol   . Joint pain   . Multiple sclerosis (HCC)   . Neuromuscular disorder (HCC)    Multiple Sclerosis over 20 years  . Obesity   . Vitamin D deficiency    Past Surgical History:  Procedure Laterality Date  . APPENDECTOMY    . CESAREAN SECTION    . HAND SURGERY    . lumbar back surgery    . LUMBAR WOUND DEBRIDEMENT N/A 12/05/2020   Procedure: Lumbar Wound Exploration;  Surgeon: Coletta Memos, MD;  Location: [facility] OR;  Service: Neurosurgery;  Laterality: N/A;  3C/RM 21  . ROTATOR CUFF REPAIR    . TONSILLECTOMY     Prior to Admission medications   Medication Sig Start Date End Date Taking? Authorizing Provider  Cholecalciferol (VITAMIN D-3) 125 MCG (5000 UT) TABS Take 5,000 Units by mouth daily.   Yes [provider]  gabapentin (NEURONTIN) 300 MG capsule Take 300 mg by mouth 2 (two) times daily.   Yes [provider]  hydrochlorothiazide (HYDRODIURIL) 25 MG tablet Take 25 mg by mouth daily.   Yes [provider]  HYDROcodone-acetaminophen (NORCO/VICODIN) 5-325 MG tablet Take 1 tablet by mouth every 4 (four) hours. 11/28/20  Yes [provider]  losartan (COZAAR) 100 MG tablet Take 100 mg by mouth daily. 01/20/20  Yes [provider]  modafinil (PROVIGIL) 200 MG tablet Take 200 mg by mouth See admin instructions. Morning and Midday by 1400-1500   Yes [provider]  Multiple Vitamins-Minerals (MULTIVITAMIN WITH MINERALS) tablet Take 1 tablet by  mouth daily. 50 +   Yes [provider]  nabumetone (RELAFEN) 500 MG tablet Take 500 mg by mouth 2 (two) times daily as needed. 02/01/21  Yes [provider]  rosuvastatin (CRESTOR) 10 MG tablet Take 10 mg by mouth at bedtime.    Yes [provider]  Teriflunomide 14 MG TABS Take 14 mg by mouth daily.    Yes [provider]  vitamin C (ASCORBIC ACID) 500 MG tablet Take 500 mg by mouth daily.   Yes [provider]  tiZANidine (ZANAFLEX) 4 MG tablet Take 1 tablet (4 mg total) by mouth every 8 (eight) hours as needed for muscle spasms. 12/06/20   Coletta Memos, MD   Family History  Problem Relation Age of Onset  . High blood pressure Mother   . High Cholesterol Mother   . Thyroid disease Mother   . Cancer Mother   . High blood pressure Father   . High Cholesterol Father   . Heart disease Father   . Obesity Father    Social History   Socioeconomic History  . Marital status: Single    Spouse name: Not on file  . Number of children: Not on file  . Years of education: Not on file  . Highest education level: Not on file  Occupational History  . Occupation: work from Manufacturing systems engineer call center  Tobacco Use  .  Smoking status: Never Smoker  . Smokeless tobacco: Never Used  Vaping Use  . Vaping Use: Never used  Substance and Sexual Activity  . Alcohol use: Yes    Comment: social (1 time a month)  . Drug use: No  . Sexual activity: Not on file  Other Topics Concern  . Not on file  Social History Narrative  . Not on file   Social Determinants of Health   Financial Resource Strain: Not on file  Food Insecurity: Not on file  Transportation Needs: Not on file  Physical Activity: Not on file  Stress: Not on file  Social Connections: Not on file  Intimate Partner Violence: Not on file   Physical Exam Constitutional:      General: She is not in acute distress.    Appearance: She is obese. She is not toxic-appearing.  HENT:     Head:  Normocephalic and atraumatic.     Right Ear: Tympanic membrane and ear canal normal.     Left Ear: Tympanic membrane and ear canal normal.     Nose: Nose normal.     Mouth/Throat:     Mouth: Mucous membranes are moist.     Pharynx: Oropharynx is clear.  Eyes:     Extraocular Movements: Extraocular movements intact.     Pupils: Pupils are equal, round, and reactive to light.  Cardiovascular:     Rate and Rhythm: Normal rate and regular rhythm.     Pulses: Normal pulses.     Heart sounds: Normal heart sounds.  Pulmonary:     Effort: Pulmonary effort is normal.     Breath sounds: Normal breath sounds.  Abdominal:     General: Abdomen is flat.  Musculoskeletal:        General: Normal range of motion.     Cervical back: Normal range of motion and neck supple.  Skin:    General: Skin is warm and dry.  Neurological:     General: No focal deficit present.     Mental Status: She is alert and oriented to person, place, and time.     Cranial Nerves: No cranial nerve deficit.     Sensory: No sensory deficit.     Motor: No weakness.     Coordination: Coordination normal.     Deep Tendon Reflexes: Reflexes normal.  Psychiatric:        Mood and Affect: Mood normal.        Behavior: Behavior normal.        Thought Content: Thought content normal.        Judgment: Judgment normal.   Admit for wound exploration.

## 2021-02-22 ENCOUNTER — Encounter (HOSPITAL_COMMUNITY): Payer: Self-pay | Admitting: Neurosurgery

## 2021-02-22 DIAGNOSIS — M7989 Other specified soft tissue disorders: Secondary | ICD-10-CM | POA: Diagnosis not present

## 2021-02-22 MED ORDER — OXYCODONE HCL 5 MG PO TABS: 5.0000 mg | ORAL_TABLET | ORAL | 0 refills | Status: DC | PRN

## 2021-02-22 NOTE — Progress Notes (Signed)
Pt doing well. Pt given D/C instructions with verbal understanding. Rx was sent to the pharmacy by MD. Pt's incision is clean and dry with no sign of infection. Pt's IV was removed prior to D/C. Pt D/C'd home via wheelchair per MD order. Pt is stable @ D/C and has no other needs at this time. Dominiqua Cooner, RN  

## 2021-02-22 NOTE — Discharge Summary (Signed)
Physician Discharge Summary  Patient ID: [name] MRN: [id number] DOB/AGE: 08-Oct-1961 60 y.o.  Admit date: 02/21/2021 Discharge date: 02/22/2021  Admission Diagnoses:  Wound drainage Wound dehiscience  Discharge Diagnoses:  Fatty necrosis of lumbar wound Active Problems:   Wound drainage   Wound dehiscence   Discharged Condition: Stable  Hospital Course:  [name] is a 60 y.o. female who was admitted for the below procedure. Intraop findings not consistent with post operative infection, but rather fatty necrosis. There were no post operative complications. At time of discharge, pain was well controlled, ambulating with Pt/OT, tolerating po, voiding normal. Ready for discharge.  Treatments: Surgery - Lumbar wound exploration  Discharge Exam: Blood pressure 134/78, pulse 82, temperature 98.1 F (36.7 C), temperature source Oral, resp. rate 16, height 5\' 3"  (1.6 m), weight 120.2 kg, SpO2 100 %. Awake, alert, oriented Speech fluent, appropriate CN grossly intact 5/5 BUE/BLE Wound c/d/i, sutures in place  Disposition: Discharge disposition: 01-Home or Self Care       Discharge Instructions    Call MD for:  difficulty breathing, headache or visual disturbances   Complete by: As directed    Call MD for:  persistant dizziness or light-headedness   Complete by: As directed    Call MD for:  redness, tenderness, or signs of infection (pain, swelling, redness, odor or green/yellow discharge around incision site)   Complete by: As directed    Call MD for:  severe uncontrolled pain   Complete by: As directed    Call MD for:  temperature >100.4   Complete by: As directed    Diet - low sodium heart healthy   Complete by: As directed    Driving Restrictions   Complete by: As directed    Do not drive until given clearance.   Increase activity slowly   Complete by: As directed    Lifting restrictions   Complete by: As directed    Do not lift anything  >10lbs. Avoid bending and twisting in awkward positions. Avoid bending at the back.   May shower / Bathe   Complete by: As directed    In 24 hours. Okay to wash wound with warm soapy water. Avoid scrubbing the wound. Pat dry.   Remove dressing in 24 hours   Complete by: As directed      Allergies as of 02/22/2021   No Known Allergies     Medication List    STOP taking these medications   HYDROcodone-acetaminophen 5-325 MG tablet Commonly known as: NORCO/VICODIN   nabumetone 500 MG tablet Commonly known as: RELAFEN     TAKE these medications   gabapentin 300 MG capsule Commonly known as: NEURONTIN Take 300 mg by mouth 2 (two) times daily.   hydrochlorothiazide 25 MG tablet Commonly known as: HYDRODIURIL Take 25 mg by mouth daily.   losartan 100 MG tablet Commonly known as: COZAAR Take 100 mg by mouth daily.   modafinil 200 MG tablet Commonly known as: PROVIGIL Take 200 mg by mouth See admin instructions. Morning and Midday by 1400-1500   multivitamin with minerals tablet Take 1 tablet by mouth daily. 50 +   oxyCODONE 5 MG immediate release tablet Commonly known as: Roxicodone Take 1 tablet (5 mg total) by mouth every 4 (four) hours as needed.   rosuvastatin 10 MG tablet Commonly known as: CRESTOR Take 10 mg by mouth at bedtime.   Teriflunomide 14 MG Tabs Take 14 mg by mouth daily.   tiZANidine 4 MG tablet  Commonly known as: ZANAFLEX Take 1 tablet (4 mg total) by mouth every 8 (eight) hours as needed for muscle spasms.   vitamin C 500 MG tablet Commonly known as: ASCORBIC ACID Take 500 mg by mouth daily.   Vitamin D-3 125 MCG (5000 UT) Tabs Take 5,000 Units by mouth daily.       Follow-up Information    Schedule an appointment as soon as possible for a visit with Coletta Memos, MD.   Specialty: Neurosurgery Why: please call to make an appointment, For wound re-check Contact information: [address] [place]  [address] [phone]               Signed: Alyson Ingles 02/22/2021, 12:43 PM

## 2021-02-23 LAB — SURGICAL PATHOLOGY

## 2021-02-26 LAB — AEROBIC/ANAEROBIC CULTURE W GRAM STAIN (SURGICAL/DEEP WOUND): Gram Stain: NONE SEEN

## 2021-03-22 ENCOUNTER — Other Ambulatory Visit: Payer: Self-pay | Admitting: Neurosurgery

## 2021-03-22 DIAGNOSIS — M4316 Spondylolisthesis, lumbar region: Secondary | ICD-10-CM

## 2021-03-22 DIAGNOSIS — G35 Multiple sclerosis: Secondary | ICD-10-CM

## 2021-03-31 ENCOUNTER — Ambulatory Visit (HOSPITAL_BASED_OUTPATIENT_CLINIC_OR_DEPARTMENT_OTHER)
Admission: RE | Admit: 2021-03-31 | Discharge: 2021-03-31 | Disposition: A | Discharge status: Home / Self Care | Payer: 59 | Source: Ambulatory Visit | Attending: Neurosurgery | Admitting: Neurosurgery

## 2021-03-31 ENCOUNTER — Other Ambulatory Visit: Payer: Self-pay

## 2021-03-31 DIAGNOSIS — M4316 Spondylolisthesis, lumbar region: Secondary | ICD-10-CM | POA: Insufficient documentation

## 2021-03-31 DIAGNOSIS — G35 Multiple sclerosis: Secondary | ICD-10-CM

## 2021-03-31 IMAGING — MR MR LUMBAR SPINE WO/W CM
4 of 8 series · 24 of 48 positions shown · IV contrast (gadavist)
Comparison: MRI lumbar spine [DATE] at [REDACTED].

CLINICAL DATA: Lumbar fusion [DATE] pain and weakness.

EXAM:
MRI LUMBAR SPINE WITHOUT AND WITH CONTRAST
TECHNIQUE: Multiplanar and multiecho pulse sequences of the lumbar spine were
obtained without and with intravenous contrast.
CONTRAST:  10mL GADAVIST GADOBUTROL 1 MMOL/ML IV SOLN

[Series 4: T1 · sagittal · 4.0mm · 0.51mm/px · 4 of 15 slices shown (1 of 2)]
[im 1/15]
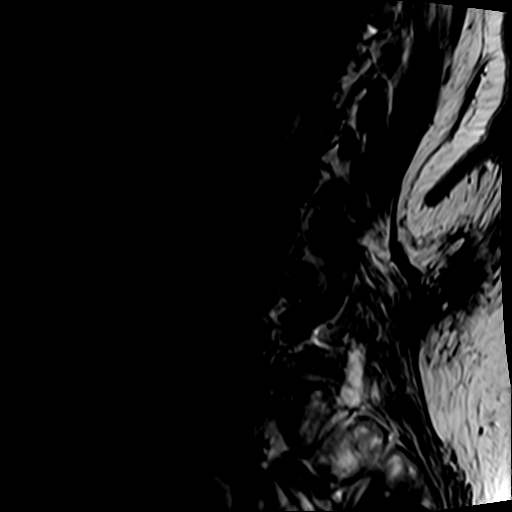
[im 5/15]
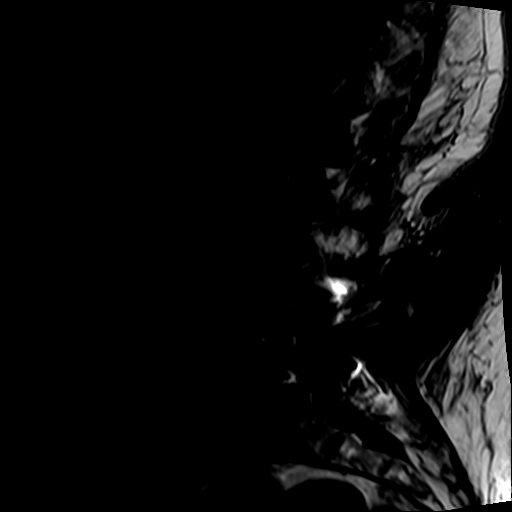
[im 10/15]
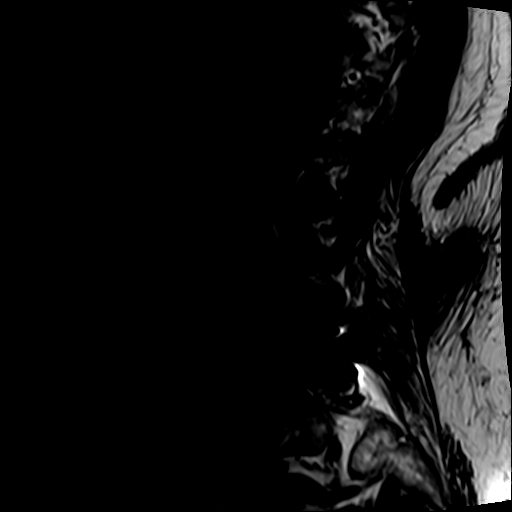
[im 15/15]
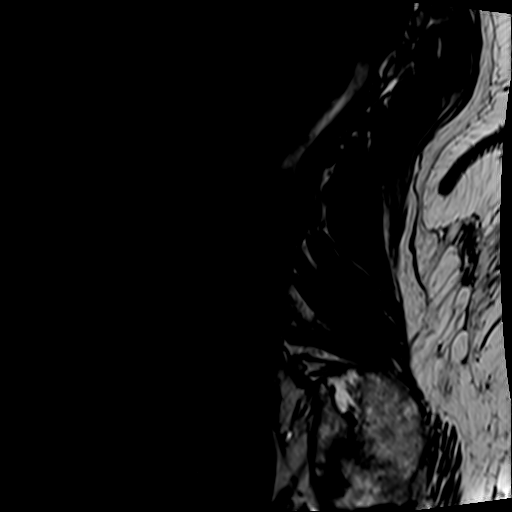

[Series 7: T2 · axial · 4.0mm · 0.78mm/px · z∈[-609,-407]mm · 9 of 34 slices shown (1 of 2)]
[im 1/34]
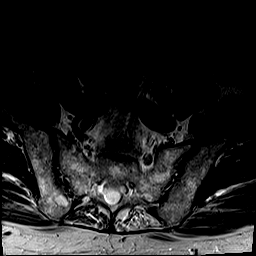
[im 5/34]
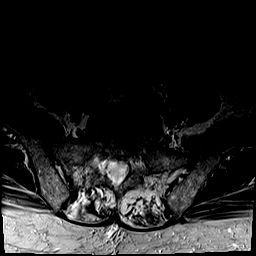
[im 9/34]
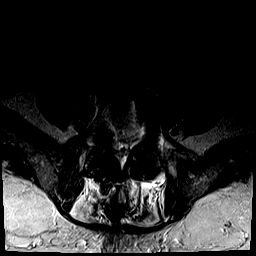
[im 13/34]
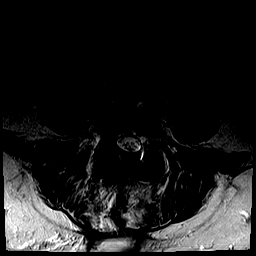
[im 17/34]
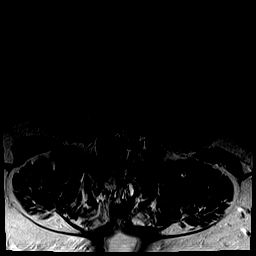
[im 21/34]
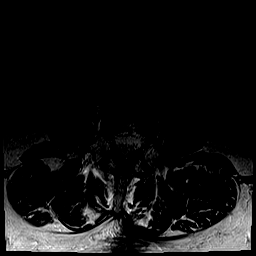
[im 25/34]
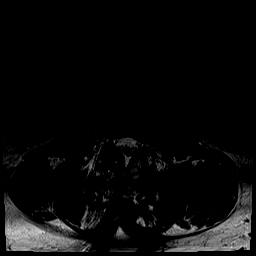
[im 29/34]
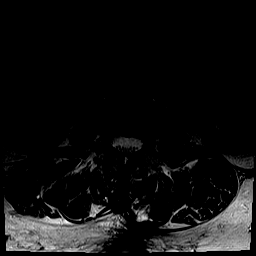
[im 34/34]
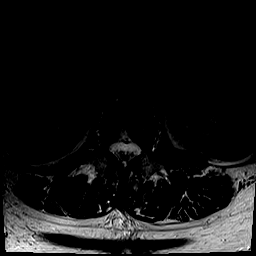

[Series 8: T1 · axial · 4.0mm · 0.39mm/px · z∈[-609,-431]mm · 7 of 34 slices shown (2 of 2)]
[im 1/34]
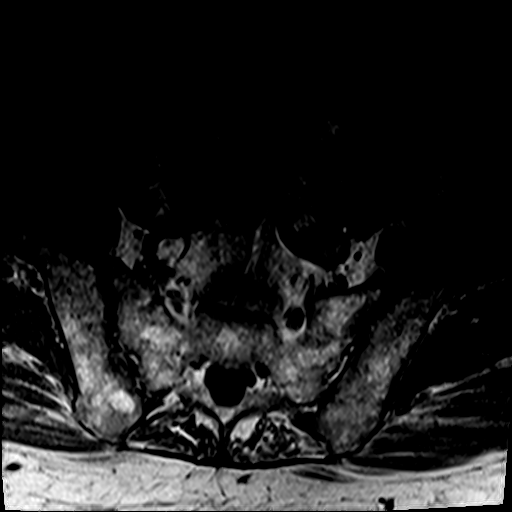
[im 5/34]
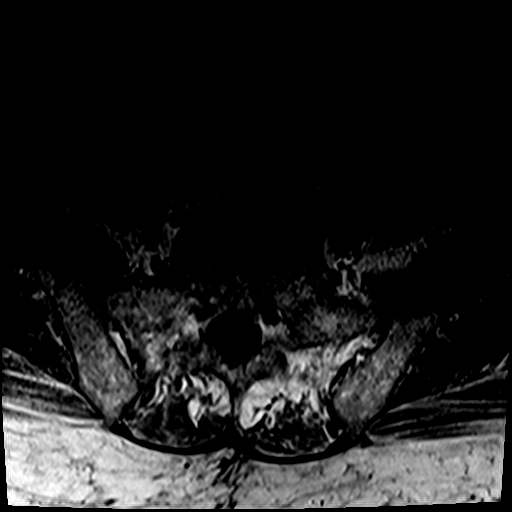
[im 9/34]
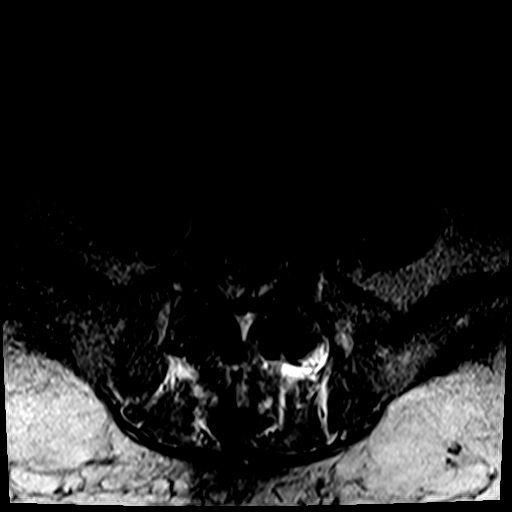
[im 13/34]
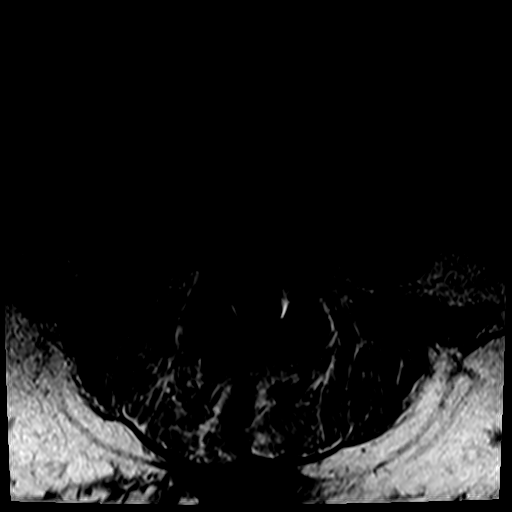
[im 17/34]
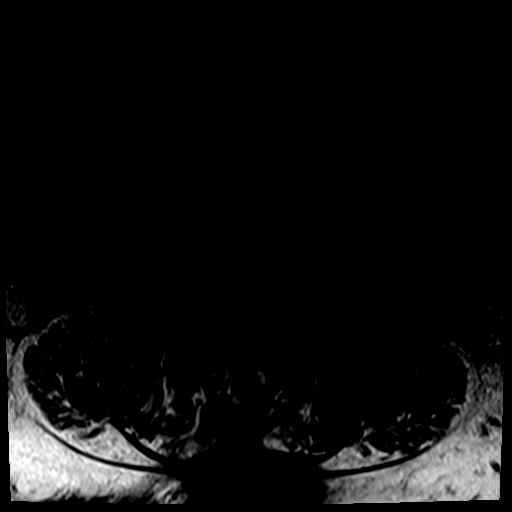
[im 21/34]
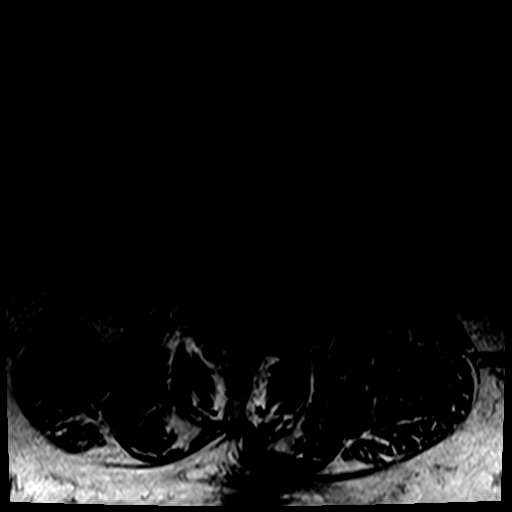
[im 29/34]
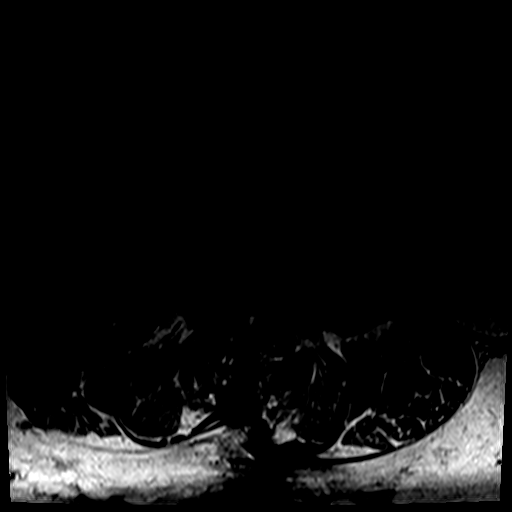

[Series 9: T2 · sagittal · 4.0mm · 0.51mm/px · 4 of 15 slices shown (2 of 2)]
[im 1/15]
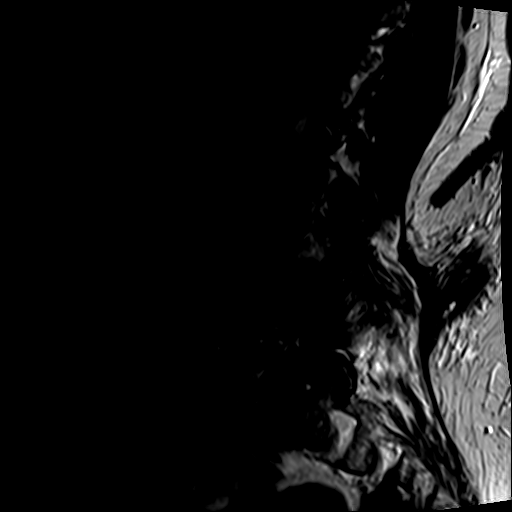
[im 5/15]
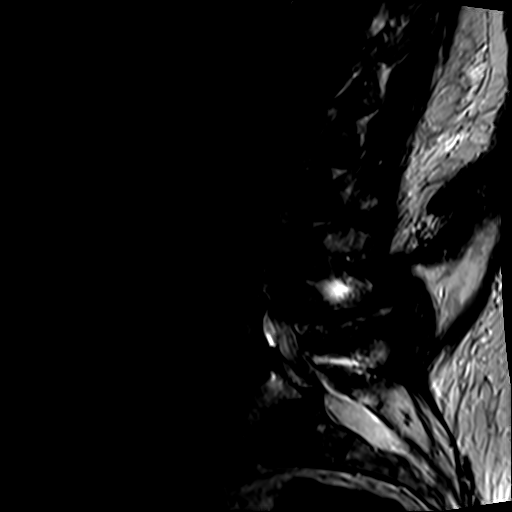
[im 10/15]
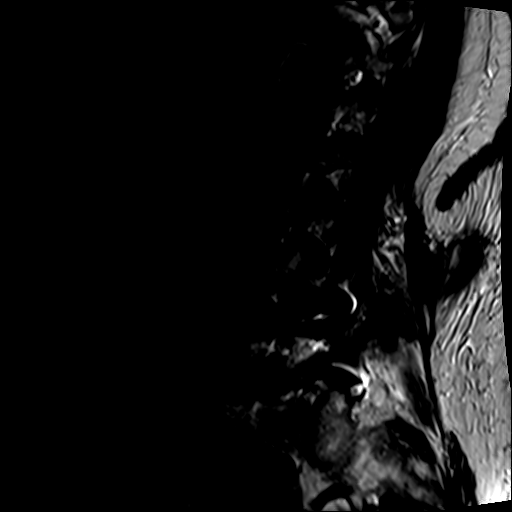
[im 15/15]
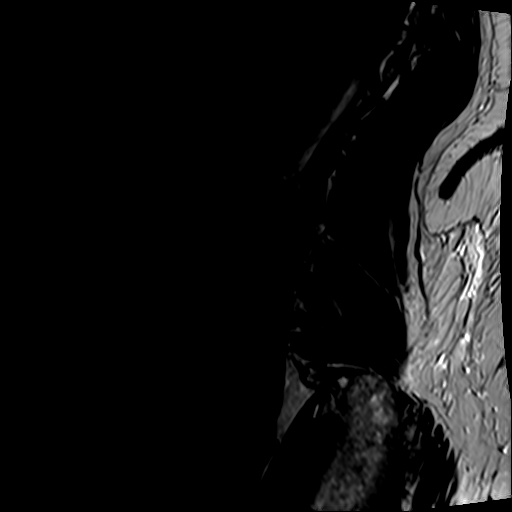

[24 of 48 positions shown; findings below may reference images not displayed]

FINDINGS: Segmentation: 5 non rib-bearing lumbar type vertebral bodies are
present. The lowest fully formed vertebral body is L5.

Alignment: Grade 1 anterolisthesis at L4-5 is 3 mm, not
significantly changed. No other significant listhesis is present.
Lumbar lordosis preserved.

Vertebrae: Marrow signal is distorted by hardware at L4 and L5. Mild
fatty endplate changes are present at L4-5. Marrow signal and
vertebral body heights are otherwise normal. Expected postsurgical
enhancement is present. No pathologic enhancement in the vertebral
bodies.

Conus medullaris and cauda equina: Conus extends to the L1-2 level.
Conus and cauda equina appear normal.

Paraspinal and other soft tissues: Peripherally enhancing fluid
collection within the subcutaneous soft tissues in the midline
extends from L1 through L3-4. This is external to the spine.

Muscle atrophy is evident below the operative level.

Disc levels:

L1-2: Broad-based disc protrusion extends into the foramina
bilaterally. Moderate facet hypertrophy is noted bilaterally.
Moderate right and mild left foraminal narrowing is not
significantly changed.

L2-3: Broad-based disc protrusion extends into the foramina
bilaterally. Facet hypertrophy contributes to stable mild foraminal
narrowing bilaterally.

L3-4: A broad-based disc protrusion is present. Mild right
subarticular and bilateral foraminal narrowing is stable.

L4-5: Wide laminectomy noted. Granulation tissue is present. No
residual or recurrent stenosis is present.

L5-S1: Broad-based disc protrusion is asymmetric to the right.
Moderate facet hypertrophy is present. Progressive moderate central
and bilateral foraminal narrowing is present.
IMPRESSION: 1. Postoperative changes at L4-5. Decompression of the central canal
and foramina without residual or recurrent stenosis.
2. Progressive moderate central and bilateral foraminal narrowing at
L5-S1.
3. Stable mild right subarticular and bilateral foraminal narrowing
at L3-4.
4. Stable mild foraminal narrowing bilaterally at L2-3.
5. Moderate right and mild left foraminal narrowing at L1-2 is not
significantly changed.
6. Postoperative fluid collection within the subcutaneous soft
tissues external to the spine as described.

## 2021-03-31 IMAGING — MR MR HEAD WO/W CM
12 of 14 series · 28 of 48 positions shown · IV contrast (gadavist)
Comparison: None.

CLINICAL DATA: Multiple sclerosis follow-up.

EXAM:
MRI HEAD WITHOUT AND WITH CONTRAST
TECHNIQUE: Multiplanar, multiecho pulse sequences of the brain and surrounding
structures were obtained without and with intravenous contrast.
CONTRAST:  10mL GADAVIST GADOBUTROL 1 MMOL/ML IV SOLN

[Series 2: T1 · sagittal · 5.0mm · 0.45mm/px · 2 of 23 slices shown]
[im 1/23]
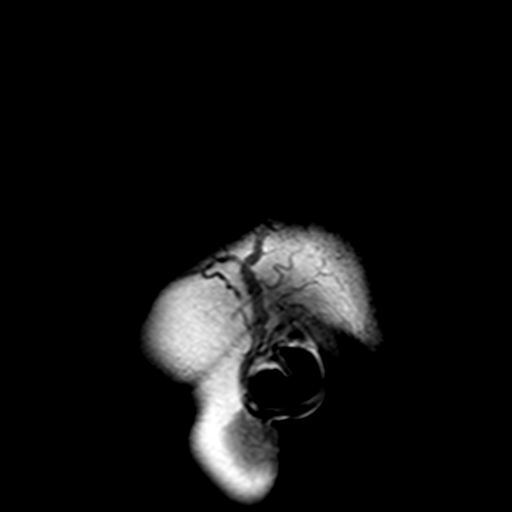
[im 23/23]
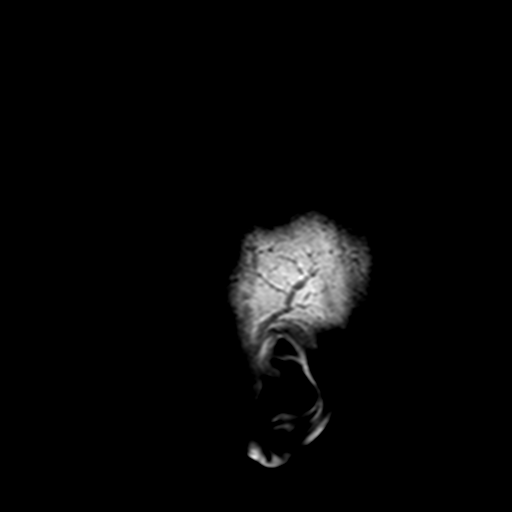

[Series 3: DWI · axial · 3.0mm · 2.19mm/px · z∈[-67,+85]mm · 6 of 101 slices shown (1 of 4)]
[im 1/101]
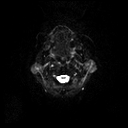
[im 21/101]
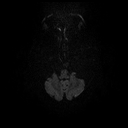
[im 41/101]
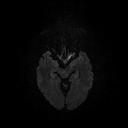
[im 61/101]
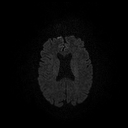
[im 81/101]
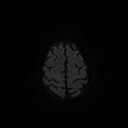
[im 101/101]
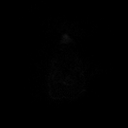

[Series 4: DWI · axial · 3.0mm · 2.19mm/px · z∈[-67,+85]mm · 3 of 51 slices shown (2 of 4)]
[im 1/51]
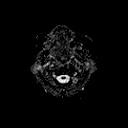
[im 26/51]
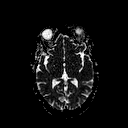
[im 51/51]
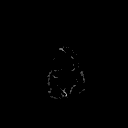

[Series 5: DWI · coronal · 5.0mm · 1.46mm/px · 5 of 76 slices shown (3 of 4)]
[im 1/76]
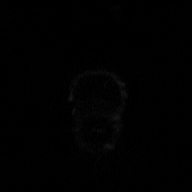
[im 19/76]
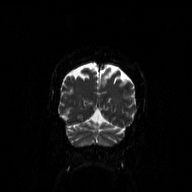
[im 38/76]
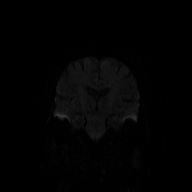
[im 57/76]
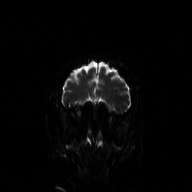
[im 76/76]
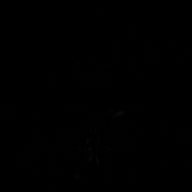

[Series 6: DWI · coronal · 5.0mm · 1.46mm/px · 2 of 38 slices shown (4 of 4)]
[im 1/38]
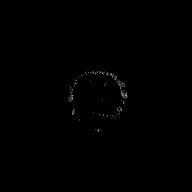
[im 38/38]
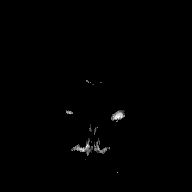

[Series 7: T2 · axial · 5.0mm · 0.45mm/px · 1 of 22 slices shown (1 of 2)]
[im 1/22]
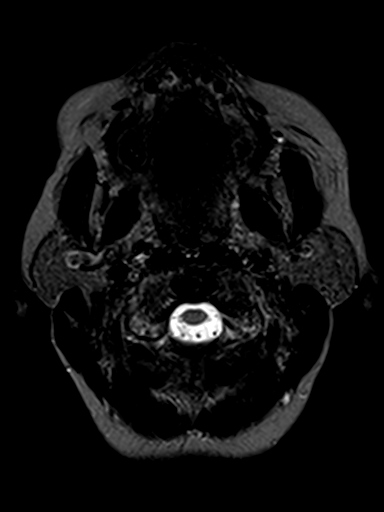

[Series 8: FLAIR · axial · 3.0mm · 0.90mm/px · z∈[-63,+92]mm · 2 of 40 slices shown (1 of 2)]
[im 1/40]
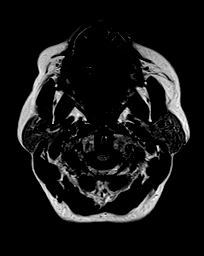
[im 40/40]
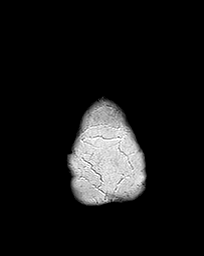

[Series 9: T2 · axial · 5.0mm · 0.45mm/px · 1 of 22 slices shown (2 of 2)]
[im 1/22]
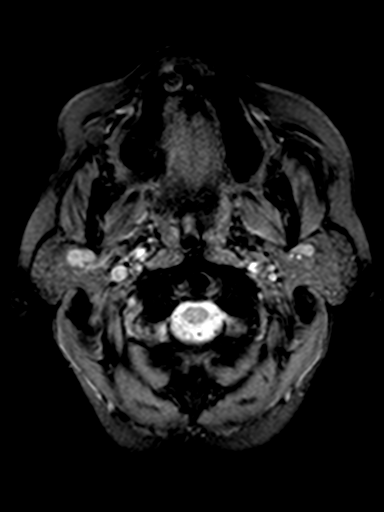

[Series 11: FLAIR · sagittal · 5.0mm · 0.47mm/px · 1 of 23 slices shown (2 of 2)]
[im 1/23]
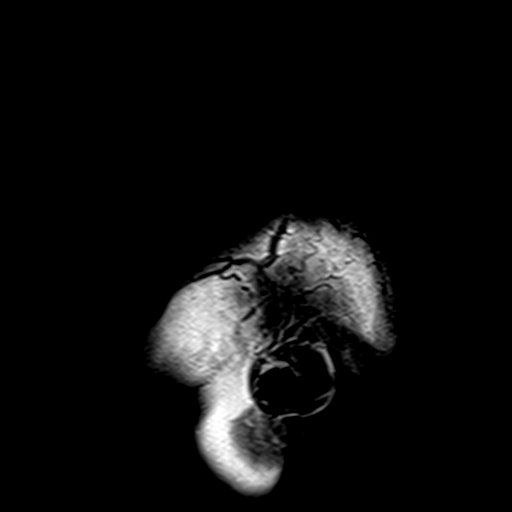

[Series 12: T2 post-contrast · coronal · 5.0mm · 0.45mm/px · 2 of 28 slices shown]
[im 1/28]
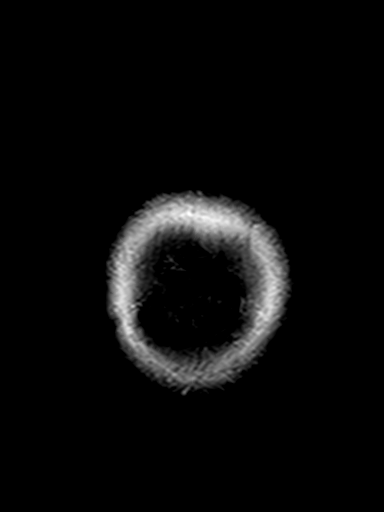
[im 28/28]
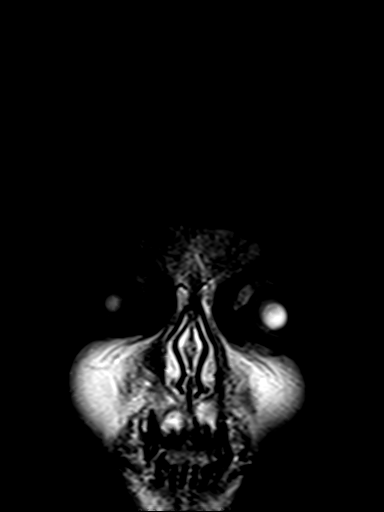

[Series 14: T1 post-contrast · coronal · 5.0mm · 0.45mm/px · 2 of 28 slices shown]
[im 1/28]
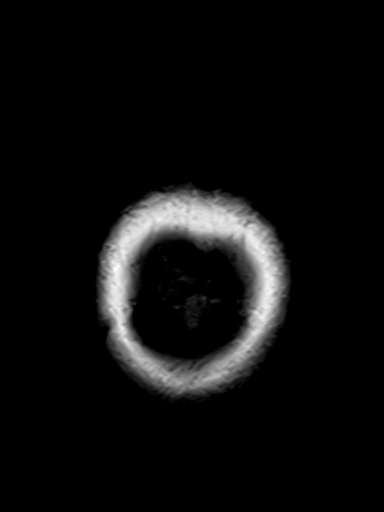
[im 28/28]
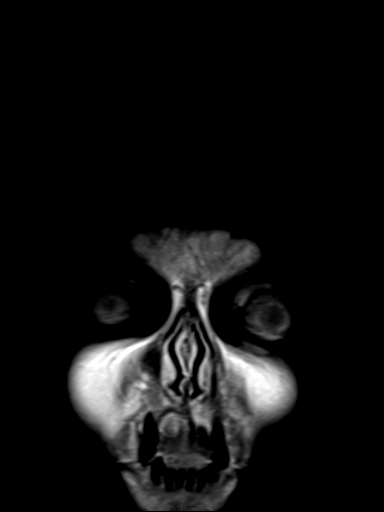

[Series 100: hx · axial · 10.0mm · 0.55mm/px · 1 of 13 slices shown]
[im 1/13]
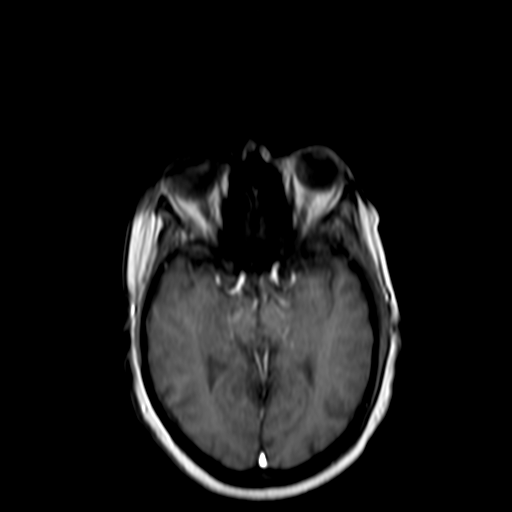

[28 of 48 positions shown; findings below may reference images not displayed]

FINDINGS: Brain: No acute infarction, hemorrhage, hydrocephalus, extra-axial
collection or mass lesion. Small T2/FLAIR hyperintensity in the
juxtacortical right frontal white matter. There are a few additional
punctate T2/FLAIR hyperintensities within the white matter
bilaterally. Punctate T2/FLAIR hyperintensity in the left
cerebellum. No abnormal enhancement. No restricted diffusion.

Vascular: Major arterial flow voids are maintained at the skull
base. Small vertebrobasilar system.

Skull and upper cervical spine: Normal marrow signal.

Sinuses/Orbits: Air-fluid level in the left sphenoid sinus.

Other: Trace right mastoid effusion. Nonenhanced cyst in the midline
nasopharynx, compatible with a Tornwaldt cyst.
IMPRESSION: 1. No evidence of acute intracranial abnormality.
2. Very mild nonspecific T2/FLAIR hyperintensities within the white
matter, as detailed above. No priors available for comparison.
3. Air-fluid level in the left sphenoid sinus.

## 2021-03-31 MED ORDER — GADOBUTROL 1 MMOL/ML IV SOLN
10.0000 mL | Freq: Once | INTRAVENOUS | Status: AC | PRN
  Administered 2021-03-31: 10 mL via INTRAVENOUS

## 2021-04-04 ENCOUNTER — Other Ambulatory Visit: Payer: Self-pay | Admitting: Neurosurgery

## 2021-04-09 ENCOUNTER — Other Ambulatory Visit (HOSPITAL_COMMUNITY)
Admission: RE | Admit: 2021-04-09 | Discharge: 2021-04-09 | Disposition: A | Discharge status: Home / Self Care | Payer: 59 | Source: Ambulatory Visit | Attending: Neurosurgery | Admitting: Neurosurgery

## 2021-04-09 DIAGNOSIS — Z20822 Contact with and (suspected) exposure to covid-19: Secondary | ICD-10-CM | POA: Diagnosis not present

## 2021-04-09 DIAGNOSIS — Z01812 Encounter for preprocedural laboratory examination: Secondary | ICD-10-CM | POA: Diagnosis present

## 2021-04-10 ENCOUNTER — Encounter (HOSPITAL_COMMUNITY): Payer: Self-pay | Admitting: Neurosurgery

## 2021-04-10 ENCOUNTER — Other Ambulatory Visit: Payer: Self-pay

## 2021-04-10 LAB — SARS CORONAVIRUS 2 (TAT 6-24 HRS): SARS Coronavirus 2: NEGATIVE

## 2021-04-10 NOTE — Progress Notes (Signed)
Ms Ridgely denies chest pain or shortness of breath. Patient was tested for Covid and has been in quarantine since that time.

## 2021-04-11 ENCOUNTER — Encounter (HOSPITAL_COMMUNITY)
Admission: RE | Disposition: A | Discharge status: Home / Self Care | Payer: Self-pay | Source: Home / Self Care | Attending: Neurosurgery

## 2021-04-11 ENCOUNTER — Other Ambulatory Visit: Payer: Self-pay

## 2021-04-11 ENCOUNTER — Encounter (HOSPITAL_COMMUNITY): Payer: Self-pay | Admitting: Neurosurgery

## 2021-04-11 ENCOUNTER — Ambulatory Visit (HOSPITAL_COMMUNITY): Payer: 59

## 2021-04-11 ENCOUNTER — Observation Stay (HOSPITAL_COMMUNITY)
Admission: RE | Admit: 2021-04-11 | Discharge: 2021-04-13 | Disposition: A | Discharge status: Home / Self Care | Payer: 59 | Attending: Neurosurgery | Admitting: Neurosurgery

## 2021-04-11 DIAGNOSIS — Z79899 Other long term (current) drug therapy: Secondary | ICD-10-CM | POA: Diagnosis not present

## 2021-04-11 DIAGNOSIS — R030 Elevated blood-pressure reading, without diagnosis of hypertension: Secondary | ICD-10-CM | POA: Diagnosis not present

## 2021-04-11 DIAGNOSIS — G35 Multiple sclerosis: Secondary | ICD-10-CM | POA: Diagnosis not present

## 2021-04-11 DIAGNOSIS — M96842 Postprocedural seroma of a musculoskeletal structure following a musculoskeletal system procedure: Secondary | ICD-10-CM | POA: Diagnosis present

## 2021-04-11 DIAGNOSIS — L7634 Postprocedural seroma of skin and subcutaneous tissue following other procedure: Secondary | ICD-10-CM | POA: Diagnosis present

## 2021-04-11 DIAGNOSIS — E669 Obesity, unspecified: Secondary | ICD-10-CM | POA: Diagnosis not present

## 2021-04-11 DIAGNOSIS — E559 Vitamin D deficiency, unspecified: Secondary | ICD-10-CM | POA: Diagnosis not present

## 2021-04-11 HISTORY — DX: Unspecified osteoarthritis, unspecified site: M19.90

## 2021-04-11 HISTORY — PX: LUMBAR WOUND DEBRIDEMENT: SHX1988

## 2021-04-11 LAB — TYPE AND SCREEN
ABO/RH(D): A POS
Antibody Screen: NEGATIVE

## 2021-04-11 LAB — BASIC METABOLIC PANEL
Anion gap: 10 (ref 5–15)
BUN: 12 mg/dL (ref 8–23)
CO2: 26 mmol/L (ref 22–32)
Calcium: 9 mg/dL (ref 8.9–10.3)
Chloride: 102 mmol/L (ref 98–111)
Creatinine, Ser: 0.74 mg/dL (ref 0.44–1.00)
GFR, Estimated: >60 mL/min (ref >60)
Glucose, Bld: 87 mg/dL (ref 70–99)
Potassium: 3.7 mmol/L (ref 3.5–5.1)
Sodium: 138 mmol/L (ref 135–145)

## 2021-04-11 LAB — CBC
HCT: 37.2 % (ref 36.0–46.0)
HCT: 35.4 % — ABNORMAL LOW (ref 36.0–46.0)
Hemoglobin: 11.5 g/dL — ABNORMAL LOW (ref 12.0–15.0)
Hemoglobin: 11.2 g/dL — ABNORMAL LOW (ref 12.0–15.0)
MCH: 28.8 pg (ref 26.0–34.0)
MCH: 29 pg (ref 26.0–34.0)
MCHC: 30.9 g/dL (ref 30.0–36.0)
MCHC: 31.6 g/dL (ref 30.0–36.0)
MCV: 93.2 fL (ref 80.0–100.0)
MCV: 91.7 fL (ref 80.0–100.0)
Platelets: 229 10*3/uL (ref 150–400)
Platelets: 228 10*3/uL (ref 150–400)
RBC: 3.99 MIL/uL (ref 3.87–5.11)
RBC: 3.86 MIL/uL — ABNORMAL LOW (ref 3.87–5.11)
RDW: 13.6 % (ref 11.5–15.5)
RDW: 13.7 % (ref 11.5–15.5)
WBC: 4.4 10*3/uL (ref 4.0–10.5)
WBC: 4.4 10*3/uL (ref 4.0–10.5)
nRBC: 0 % (ref 0.0–0.2)
nRBC: 0 % (ref 0.0–0.2)

## 2021-04-11 LAB — CREATININE, SERUM
Creatinine, Ser: 0.69 mg/dL (ref 0.44–1.00)
GFR, Estimated: >60 mL/min (ref >60)

## 2021-04-11 SURGERY — LUMBAR WOUND DEBRIDEMENT
Anesthesia: General | Site: Spine Lumbar

## 2021-04-11 MED ORDER — HYDROMORPHONE HCL 1 MG/ML IJ SOLN
0.2500 mg | INTRAMUSCULAR | Status: DC | PRN
  Administered 2021-04-11 (×4): 0.5 mg via INTRAVENOUS

## 2021-04-11 MED ORDER — CHLORHEXIDINE GLUCONATE 0.12 % MT SOLN: 15.0000 mL | Freq: Once | OROMUCOSAL | Status: AC

## 2021-04-11 MED ORDER — HYDROCODONE-ACETAMINOPHEN 7.5-325 MG PO TABS
2.0000 | ORAL_TABLET | ORAL | Status: DC | PRN
  Administered 2021-04-11 – 2021-04-13 (×9): 2 via ORAL
  Filled 2021-04-11 (×9): qty 2

## 2021-04-11 MED ORDER — HEPARIN SODIUM (PORCINE) 5000 UNIT/ML IJ SOLN
5000.0000 [IU] | Freq: Three times a day (TID) | INTRAMUSCULAR | Status: DC
  Administered 2021-04-12 – 2021-04-13 (×5): 5000 [IU] via SUBCUTANEOUS
  Filled 2021-04-11 (×5): qty 1

## 2021-04-11 MED ORDER — ONDANSETRON HCL 4 MG/2ML IJ SOLN
INTRAMUSCULAR | Status: DC | PRN
  Administered 2021-04-11: 4 mg via INTRAVENOUS

## 2021-04-11 MED ORDER — PROMETHAZINE HCL 25 MG/ML IJ SOLN: 6.2500 mg | INTRAMUSCULAR | Status: DC | PRN

## 2021-04-11 MED ORDER — ROCURONIUM BROMIDE 10 MG/ML (PF) SYRINGE
PREFILLED_SYRINGE | INTRAVENOUS | Status: DC | PRN
  Administered 2021-04-11: 50 mg via INTRAVENOUS

## 2021-04-11 MED ORDER — MENTHOL 3 MG MT LOZG: 1.0000 | LOZENGE | OROMUCOSAL | Status: DC | PRN

## 2021-04-11 MED ORDER — ORAL CARE MOUTH RINSE: 15.0000 mL | Freq: Once | OROMUCOSAL | Status: AC

## 2021-04-11 MED ORDER — MODAFINIL 100 MG PO TABS
200.0000 mg | ORAL_TABLET | ORAL | Status: DC
  Administered 2021-04-12 – 2021-04-13 (×4): 200 mg via ORAL
  Filled 2021-04-11 (×4): qty 2

## 2021-04-11 MED ORDER — SENNOSIDES-DOCUSATE SODIUM 8.6-50 MG PO TABS: 1.0000 | ORAL_TABLET | Freq: Every evening | ORAL | Status: DC | PRN

## 2021-04-11 MED ORDER — VITAMIN D3 25 MCG (1000 UNIT) PO TABS
15000.0000 [IU] | ORAL_TABLET | Freq: Every day | ORAL | Status: DC
  Filled 2021-04-11 (×3): qty 15

## 2021-04-11 MED ORDER — CEFAZOLIN SODIUM-DEXTROSE 2-4 GM/100ML-% IV SOLN
INTRAVENOUS | Status: AC
  Filled 2021-04-11: qty 100

## 2021-04-11 MED ORDER — SODIUM CHLORIDE 0.9% FLUSH: 3.0000 mL | INTRAVENOUS | Status: DC | PRN

## 2021-04-11 MED ORDER — GABAPENTIN 300 MG PO CAPS
300.0000 mg | ORAL_CAPSULE | Freq: Three times a day (TID) | ORAL | Status: DC
  Administered 2021-04-11 – 2021-04-13 (×5): 300 mg via ORAL
  Filled 2021-04-11 (×5): qty 1

## 2021-04-11 MED ORDER — SUGAMMADEX SODIUM 200 MG/2ML IV SOLN
INTRAVENOUS | Status: DC | PRN
  Administered 2021-04-11: 300 mg via INTRAVENOUS
  Administered 2021-04-11 (×2): 100 mg via INTRAVENOUS

## 2021-04-11 MED ORDER — PHENYLEPHRINE HCL-NACL 10-0.9 MG/250ML-% IV SOLN
INTRAVENOUS | Status: AC
  Filled 2021-04-11: qty 250

## 2021-04-11 MED ORDER — CHLORHEXIDINE GLUCONATE CLOTH 2 % EX PADS: 6.0000 | MEDICATED_PAD | Freq: Once | CUTANEOUS | Status: DC

## 2021-04-11 MED ORDER — TERIFLUNOMIDE 14 MG PO TABS: 14.0000 mg | ORAL_TABLET | Freq: Every day | ORAL | Status: DC

## 2021-04-11 MED ORDER — DEXAMETHASONE SODIUM PHOSPHATE 10 MG/ML IJ SOLN
INTRAMUSCULAR | Status: DC | PRN
  Administered 2021-04-11: 10 mg via INTRAVENOUS

## 2021-04-11 MED ORDER — OXYCODONE HCL ER 10 MG PO T12A
10.0000 mg | EXTENDED_RELEASE_TABLET | Freq: Two times a day (BID) | ORAL | Status: DC
  Administered 2021-04-11 – 2021-04-13 (×4): 10 mg via ORAL
  Filled 2021-04-11 (×4): qty 1

## 2021-04-11 MED ORDER — SODIUM CHLORIDE 0.9 % IV SOLN
250.0000 mL | INTRAVENOUS | Status: DC
  Administered 2021-04-11: 250 mL via INTRAVENOUS

## 2021-04-11 MED ORDER — PHENYLEPHRINE 40 MCG/ML (10ML) SYRINGE FOR IV PUSH (FOR BLOOD PRESSURE SUPPORT)
PREFILLED_SYRINGE | INTRAVENOUS | Status: DC | PRN
  Administered 2021-04-11: 80 ug via INTRAVENOUS
  Administered 2021-04-11: 40 ug via INTRAVENOUS

## 2021-04-11 MED ORDER — ZOLPIDEM TARTRATE 5 MG PO TABS
5.0000 mg | ORAL_TABLET | Freq: Every evening | ORAL | Status: DC | PRN
  Administered 2021-04-12: 5 mg via ORAL
  Filled 2021-04-11: qty 1

## 2021-04-11 MED ORDER — BISACODYL 5 MG PO TBEC
5.0000 mg | DELAYED_RELEASE_TABLET | Freq: Every day | ORAL | Status: DC | PRN
Start: 2021-04-11 — End: 2021-04-13

## 2021-04-11 MED ORDER — FENTANYL CITRATE (PF) 250 MCG/5ML IJ SOLN
INTRAMUSCULAR | Status: AC
  Filled 2021-04-11: qty 5

## 2021-04-11 MED ORDER — ONDANSETRON HCL 4 MG PO TABS: 4.0000 mg | ORAL_TABLET | Freq: Four times a day (QID) | ORAL | Status: DC | PRN

## 2021-04-11 MED ORDER — LACTATED RINGERS IV SOLN: INTRAVENOUS | Status: DC

## 2021-04-11 MED ORDER — HYDROCHLOROTHIAZIDE 25 MG PO TABS
25.0000 mg | ORAL_TABLET | Freq: Every day | ORAL | Status: DC
  Administered 2021-04-11: 25 mg via ORAL
  Filled 2021-04-11 (×2): qty 1

## 2021-04-11 MED ORDER — MAGNESIUM CITRATE PO SOLN: 1.0000 | Freq: Once | ORAL | Status: DC | PRN

## 2021-04-11 MED ORDER — DIAZEPAM 5 MG PO TABS
5.0000 mg | ORAL_TABLET | Freq: Four times a day (QID) | ORAL | Status: DC | PRN
  Administered 2021-04-11: 5 mg via ORAL
  Filled 2021-04-11: qty 1

## 2021-04-11 MED ORDER — MEPERIDINE HCL 25 MG/ML IJ SOLN: 6.2500 mg | INTRAMUSCULAR | Status: DC | PRN

## 2021-04-11 MED ORDER — ACETAMINOPHEN 325 MG PO TABS: 650.0000 mg | ORAL_TABLET | ORAL | Status: DC | PRN

## 2021-04-11 MED ORDER — ONDANSETRON HCL 4 MG/2ML IJ SOLN
4.0000 mg | Freq: Four times a day (QID) | INTRAMUSCULAR | Status: DC | PRN
  Administered 2021-04-12: 4 mg via INTRAVENOUS
  Filled 2021-04-11: qty 2

## 2021-04-11 MED ORDER — FENTANYL CITRATE (PF) 250 MCG/5ML IJ SOLN
INTRAMUSCULAR | Status: DC | PRN
  Administered 2021-04-11 (×2): 50 ug via INTRAVENOUS

## 2021-04-11 MED ORDER — HYDROCODONE-ACETAMINOPHEN 7.5-325 MG PO TABS
1.0000 | ORAL_TABLET | ORAL | Status: DC | PRN
  Administered 2021-04-12: 1 via ORAL
  Filled 2021-04-11: qty 1

## 2021-04-11 MED ORDER — LOSARTAN POTASSIUM 50 MG PO TABS
100.0000 mg | ORAL_TABLET | Freq: Every day | ORAL | Status: DC
  Administered 2021-04-11 – 2021-04-13 (×3): 100 mg via ORAL
  Filled 2021-04-11 (×3): qty 2

## 2021-04-11 MED ORDER — ROSUVASTATIN CALCIUM 5 MG PO TABS
10.0000 mg | ORAL_TABLET | Freq: Every day | ORAL | Status: DC
  Administered 2021-04-11 – 2021-04-12 (×2): 10 mg via ORAL
  Filled 2021-04-11 (×2): qty 2

## 2021-04-11 MED ORDER — ACETAMINOPHEN 650 MG RE SUPP: 650.0000 mg | RECTAL | Status: DC | PRN

## 2021-04-11 MED ORDER — HYDROMORPHONE HCL 1 MG/ML IJ SOLN
INTRAMUSCULAR | Status: AC
  Filled 2021-04-11: qty 1

## 2021-04-11 MED ORDER — SODIUM CHLORIDE 0.9% FLUSH
3.0000 mL | Freq: Two times a day (BID) | INTRAVENOUS | Status: DC
  Administered 2021-04-11: 3 mL via INTRAVENOUS

## 2021-04-11 MED ORDER — 0.9 % SODIUM CHLORIDE (POUR BTL) OPTIME
TOPICAL | Status: DC | PRN
  Administered 2021-04-11: 1000 mL

## 2021-04-11 MED ORDER — ADULT MULTIVITAMIN W/MINERALS CH
1.0000 | ORAL_TABLET | Freq: Every day | ORAL | Status: DC
  Administered 2021-04-12 – 2021-04-13 (×2): 1 via ORAL
  Filled 2021-04-11 (×2): qty 1

## 2021-04-11 MED ORDER — PHENOL 1.4 % MT LIQD: 1.0000 | OROMUCOSAL | Status: DC | PRN

## 2021-04-11 MED ORDER — MULTI-VITAMIN/MINERALS PO TABS: 1.0000 | ORAL_TABLET | Freq: Every day | ORAL | Status: DC

## 2021-04-11 MED ORDER — CEFAZOLIN SODIUM-DEXTROSE 2-4 GM/100ML-% IV SOLN
2.0000 g | INTRAVENOUS | Status: AC
  Administered 2021-04-11: 2 g via INTRAVENOUS

## 2021-04-11 MED ORDER — POTASSIUM CHLORIDE IN NACL 20-0.9 MEQ/L-% IV SOLN: INTRAVENOUS | Status: DC

## 2021-04-11 MED ORDER — CHLORHEXIDINE GLUCONATE 0.12 % MT SOLN
OROMUCOSAL | Status: AC
  Administered 2021-04-11: 15 mL via OROMUCOSAL
  Filled 2021-04-11: qty 15

## 2021-04-11 MED ORDER — SUGAMMADEX SODIUM 500 MG/5ML IV SOLN
INTRAVENOUS | Status: AC
  Filled 2021-04-11: qty 5

## 2021-04-11 MED ORDER — LACTATED RINGERS IV SOLN: INTRAVENOUS | Status: DC | PRN

## 2021-04-11 MED ORDER — PROPOFOL 10 MG/ML IV BOLUS
INTRAVENOUS | Status: DC | PRN
  Administered 2021-04-11: 150 mg via INTRAVENOUS
  Administered 2021-04-11: 20 mg via INTRAVENOUS
  Administered 2021-04-11: 30 mg via INTRAVENOUS

## 2021-04-11 MED ORDER — LIDOCAINE 2% (20 MG/ML) 5 ML SYRINGE
INTRAMUSCULAR | Status: DC | PRN
  Administered 2021-04-11: 100 mg via INTRAVENOUS

## 2021-04-11 MED ORDER — SENNA 8.6 MG PO TABS
1.0000 | ORAL_TABLET | Freq: Two times a day (BID) | ORAL | Status: DC
  Administered 2021-04-11 – 2021-04-13 (×4): 8.6 mg via ORAL
  Filled 2021-04-11 (×4): qty 1

## 2021-04-11 SURGICAL SUPPLY — 22 items
CANISTER SUCT 3000ML PPV (MISCELLANEOUS) ×3 IMPLANT
DRAPE LAPAROTOMY 100X72X124 (DRAPES) ×3 IMPLANT
DRSG VERAFLO VAC MED (GAUZE/BANDAGES/DRESSINGS) ×3 IMPLANT
DURAPREP 26ML APPLICATOR (WOUND CARE) ×3 IMPLANT
ELECT REM PT RETURN 9FT ADLT (ELECTROSURGICAL) ×3
ELECTRODE REM PT RTRN 9FT ADLT (ELECTROSURGICAL) ×2 IMPLANT
GLOVE ECLIPSE 6.5 STRL STRAW (GLOVE) ×6 IMPLANT
GLOVE SURG PR MICRO ENCORE 7 (GLOVE) ×6 IMPLANT
GLOVE SURG PR MICRO ENCORE 7.5 (GLOVE) ×6 IMPLANT
GOWN STRL REUS W/ TWL LRG LVL3 (GOWN DISPOSABLE) ×4 IMPLANT
GOWN STRL REUS W/ TWL XL LVL3 (GOWN DISPOSABLE) ×2 IMPLANT
GOWN STRL REUS W/TWL LRG LVL3 (GOWN DISPOSABLE) ×2
GOWN STRL REUS W/TWL XL LVL3 (GOWN DISPOSABLE) ×1
KIT BASIN OR (CUSTOM PROCEDURE TRAY) ×3 IMPLANT
KIT TURNOVER KIT B (KITS) ×3 IMPLANT
NS IRRIG 1000ML POUR BTL (IV SOLUTION) ×3 IMPLANT
PACK LAMINECTOMY NEURO (CUSTOM PROCEDURE TRAY) ×3 IMPLANT
SWAB COLLECTION DEVICE MRSA (MISCELLANEOUS) ×3 IMPLANT
SWAB CULTURE ESWAB REG 1ML (MISCELLANEOUS) ×3 IMPLANT
TOWEL GREEN STERILE (TOWEL DISPOSABLE) ×3 IMPLANT
TOWEL GREEN STERILE FF (TOWEL DISPOSABLE) ×3 IMPLANT
WATER STERILE IRR 1000ML POUR (IV SOLUTION) ×3 IMPLANT

## 2021-04-11 NOTE — Transfer of Care (Signed)
Immediate Anesthesia Transfer of Care Note  Patient: [name]  Procedure(s) Performed: Seroma drainage with wound vac placement (N/A )  Patient Location: PACU  Anesthesia Type:General  Level of Consciousness: awake, alert  and oriented  Airway & Oxygen Therapy: Patient Spontanous Breathing and Patient connected to face mask oxygen  Post-op Assessment: Report given to RN and Post -op Vital signs reviewed and stable  Post vital signs: Reviewed and stable  Last Vitals:  Vitals Value Taken Time  BP 147/80 04/11/21 1506  Temp    Pulse 78 04/11/21 1508  Resp 17 04/11/21 1508  SpO2 100 % 04/11/21 1508  Vitals shown include unvalidated device data.  Last Pain:  Vitals:   04/11/21 1141  TempSrc:   PainSc: 5       Patients Stated Pain Goal: 3 (04/11/21 1141)  Complications: No complications documented.

## 2021-04-11 NOTE — Anesthesia Procedure Notes (Signed)
Procedure Name: Intubation Date/Time: 04/11/2021 2:25 PM Performed by: Lelon Perla, CRNA Pre-anesthesia Checklist: Patient identified Patient Re-evaluated:Patient Re-evaluated prior to induction Oxygen Delivery Method: Circle system utilized Preoxygenation: Pre-oxygenation with 100% oxygen Induction Type: IV induction Ventilation: Oral airway inserted - appropriate to patient size and Mask ventilation without difficulty Laryngoscope Size: Glidescope and 4 Grade View: Grade I Tube type: Oral Tube size: 7.0 mm Number of attempts: 1 Placement Confirmation: ETT inserted through vocal cords under direct vision and positive ETCO2 Dental Injury: Teeth and Oropharynx as per pre-operative assessment  Comments: Intubation by Joaquin Courts SRNA

## 2021-04-11 NOTE — H&P (Signed)
BP 140/67   Pulse 82   Temp 98.1 F (36.7 C) (Oral)   Resp 18   Ht 5\' 3"  (1.6 m)   Wt 120.2 kg   SpO2 97%   BMI 46.94 kg/m  [name] is admitted for a persistent seroma status post a lumbar fusion. Recent MRI shows no communication between the thecal sac and this post op seroma. She has been taken to the operating room on two occasions previously for this. Initially I thought the fluid represented an infection. Cultures taken both times were negative, nor was there any sign of infection. I plan on leaving the wound open and using a wound vac dressing. No Known Allergies Past Medical History:  Diagnosis Date  . Arthritis   . Back pain   . Chronic knee pain   . Edema of both lower extremities   . High blood pressure   . High cholesterol   . Joint pain   . Multiple sclerosis (HCC)   . Neuromuscular disorder (HCC)    Multiple Sclerosis over 20 years  . Obesity   . Vitamin D deficiency    Past Surgical History:  Procedure Laterality Date  . APPENDECTOMY    . CESAREAN SECTION    . HAND SURGERY    . lumbar back surgery    . LUMBAR WOUND DEBRIDEMENT N/A 12/05/2020   Procedure: Lumbar Wound Exploration;  Surgeon: 14/06/2020, MD;  Location: [facility] OR;  Service: Neurosurgery;  Laterality: N/A;  3C/RM 21  . LUMBAR WOUND DEBRIDEMENT N/A 02/21/2021   Procedure: Lumbar wound exploration;  Surgeon: 02/23/2021, MD;  Location: [facility] OR;  Service: Neurosurgery;  Laterality: N/A;  posterior  . ROTATOR CUFF REPAIR    . TONSILLECTOMY     Family History  Problem Relation Age of Onset  . High blood pressure Mother   . High Cholesterol Mother   . Thyroid disease Mother   . Cancer Mother   . High blood pressure Father   . High Cholesterol Father   . Heart disease Father   . Obesity Father    Social History   Socioeconomic History  . Marital status: Single    Spouse name: Not on file  . Number of children: Not on file  . Years of education: Not on file  . Highest education level: Not on  file  Occupational History  . Occupation: work from [employer] call center  Tobacco Use  . Smoking status: Never Smoker  . Smokeless tobacco: Never Used  Vaping Use  . Vaping Use: Never used  Substance and Sexual Activity  . Alcohol use: Yes    Comment: social (1 time a month)  . Drug use: No  . Sexual activity: Not on file  Other Topics Concern  . Not on file  Social History Narrative  . Not on file   Social Determinants of Health   Financial Resource Strain: Not on file  Food Insecurity: Not on file  Transportation Needs: Not on file  Physical Activity: Not on file  Stress: Not on file  Social Connections: Not on file  Intimate Partner Violence: Not on file   Prior to Admission medications   Medication Sig Start Date End Date Taking? Authorizing Provider  Cholecalciferol (VITAMIN D-3) 125 MCG (5000 UT) TABS Take 15,000 Units by mouth daily.   Yes [provider]  gabapentin (NEURONTIN) 300 MG capsule Take 300 mg by mouth 3 (three) times daily.   Yes [provider]  hydrochlorothiazide (HYDRODIURIL)  25 MG tablet Take 25 mg by mouth daily.   Yes [provider]  HYDROcodone-acetaminophen (NORCO) 7.5-325 MG tablet Take 1 tablet by mouth every 6 (six) hours as needed for pain. 04/02/21  Yes [provider]  losartan (COZAAR) 100 MG tablet Take 100 mg by mouth daily. 01/20/20  Yes [provider]  modafinil (PROVIGIL) 200 MG tablet Take 200 mg by mouth See admin instructions. Morning and Midday by 1400-1500   Yes [provider]  Multiple Vitamins-Minerals (MULTIVITAMIN WITH MINERALS) tablet Take 1 tablet by mouth daily. 55 +   Yes [provider]  rosuvastatin (CRESTOR) 10 MG tablet Take 10 mg by mouth at bedtime.    Yes [provider]  Teriflunomide 14 MG TABS Take 14 mg by mouth daily. Aubagio   Yes [provider]  tiZANidine (ZANAFLEX) 4 MG tablet Take 1 tablet (4 mg total) by mouth every 8  (eight) hours as needed for muscle spasms. Patient not taking: Reported on 04/05/2021 12/06/20   Coletta Memos, MD   Physical Exam Constitutional:      Appearance: Normal appearance. She is obese.  HENT:     Head: Normocephalic and atraumatic.     Right Ear: Tympanic membrane normal.     Left Ear: Tympanic membrane normal.     Nose: Nose normal.     Mouth/Throat:     Mouth: Mucous membranes are moist.     Pharynx: Oropharynx is clear.  Eyes:     Extraocular Movements: Extraocular movements intact.     Conjunctiva/sclera: Conjunctivae normal.     Pupils: Pupils are equal, round, and reactive to light.  Cardiovascular:     Rate and Rhythm: Tachycardia present.     Pulses: Normal pulses.     Heart sounds: Normal heart sounds.  Pulmonary:     Effort: Pulmonary effort is normal.     Breath sounds: Normal breath sounds.  Abdominal:     General: Bowel sounds are normal.  Musculoskeletal:     Cervical back: Normal range of motion and neck supple.  Skin:    General: Skin is warm and dry.  Neurological:     General: No focal deficit present.     Mental Status: She is alert and oriented to person, place, and time.     Cranial Nerves: No cranial nerve deficit.     Sensory: No sensory deficit.     Motor: No weakness.     Coordination: Coordination normal.     Gait: Gait normal.     Deep Tendon Reflexes: Reflexes normal.  Psychiatric:        Mood and Affect: Mood normal.        Behavior: Behavior normal.        Thought Content: Thought content normal.        Judgment: Judgment normal.   or for wound revision and seroma drainage

## 2021-04-11 NOTE — Anesthesia Postprocedure Evaluation (Signed)
Anesthesia Post Note  Patient: [name]  Procedure(s) Performed: Wound Exploration with Wound Vac Placement (N/A Spine Lumbar)     Patient location during evaluation: PACU Anesthesia Type: General Level of consciousness: sedated and patient cooperative Pain management: pain level controlled Vital Signs Assessment: post-procedure vital signs reviewed and stable Respiratory status: spontaneous breathing Cardiovascular status: stable Anesthetic complications: no   No complications documented.  Last Vitals:  Vitals:   04/11/21 1650 04/11/21 1710  BP: 121/74 (!) 154/79  Pulse: 64 74  Resp: (!) 142 20  Temp: 36.9 C   SpO2: 100% 100%    Last Pain:  Vitals:   04/11/21 1650  TempSrc:   PainSc: 5                  Lewie Loron

## 2021-04-11 NOTE — Op Note (Signed)
04/11/2021  3:28 PM  PATIENT:  Lowella Grip  60 y.o. female  PRE-OPERATIVE DIAGNOSIS:  Post operative seroma of musculoskelatal structure  POST-OPERATIVE DIAGNOSIS:  Post operative seroma of musculoskelatal structure  PROCEDURE:  Procedure(s): Wound Exploration with Wound Vac Placement  SURGEON: Surgeon(s): Coletta Memos, MD  ASSISTANTS:none  ANESTHESIA:   general  EBL:  Total I/O In: 400 [I.V.:400] Out: 2 [Blood:2]  BLOOD ADMINISTERED:none  CELL SAVER GIVEN:none  COUNT:per nursing  DRAINS: none   SPECIMEN:  Source of Specimen:  wound  DICTATION: Annabel C Stonehouse was taken to the operating room, intubated, and placed under a general anesthetic without difficulty. She was positioned prone on a wilson frame with all pressure points padded. Her previous incision was prepped and draped in a sterile manner.  I opened the cavity, and clear golden fluid, just like the previous two times, drained from the wound. I did take cultures. I drained the subcutaneous cavity. Nothing resembling infected tissue was seen. I irrigated. I packed the wound with the vacuum dressing black sponge. I placed the seal on the sponges. Mrs. Allaire was rolled onto the OR stretcher, then extubated. PLAN OF CARE: Admit for overnight observation  PATIENT DISPOSITION:  PACU - hemodynamically stable.   Delay start of Pharmacological VTE agent (>24hrs) due to surgical blood loss or risk of bleeding:  no

## 2021-04-11 NOTE — Anesthesia Preprocedure Evaluation (Addendum)
Anesthesia Evaluation  Patient identified by MRN, date of birth, ID band Patient awake    Reviewed: Allergy & Precautions, NPO status , Patient's Chart, lab work & pertinent test results  Airway Mallampati: II  TM Distance: >3 FB Neck ROM: Full    Dental  (+) Dental Advisory Given, Poor Dentition, Missing,    Pulmonary neg pulmonary ROS,    Pulmonary exam normal breath sounds clear to auscultation       Cardiovascular hypertension, Pt. on medications  Rhythm:Regular Rate:Normal     Neuro/Psych  Neuromuscular disease (multiple sclerosis) negative psych ROS   GI/Hepatic negative GI ROS, Neg liver ROS,   Endo/Other  Morbid obesityBMI 47  Renal/GU negative Renal ROS     Musculoskeletal  (+) Arthritis , Osteoarthritis,  Chronic LBP Lumbar wound drainage s/p multiple lumbar surgeries    Abdominal (+) + obese,   Peds  Hematology negative hematology ROS (+)   Anesthesia Other Findings   Reproductive/Obstetrics negative OB ROS                            Anesthesia Physical  Anesthesia Plan  ASA: III  Anesthesia Plan: General   Post-op Pain Management:    Induction: Intravenous  PONV Risk Score and Plan: 3 and Ondansetron, Dexamethasone, Midazolam and Treatment may vary due to age or medical condition  Airway Management Planned: Oral ETT  Additional Equipment: None  Intra-op Plan:   Post-operative Plan: Extubation in OR  Informed Consent: I have reviewed the patients History and Physical, chart, labs and discussed the procedure including the risks, benefits and alternatives for the proposed anesthesia with the patient or authorized representative who has indicated his/her understanding and acceptance.     Dental advisory given  Plan Discussed with: CRNA  Anesthesia Plan Comments:        Anesthesia Quick Evaluation

## 2021-04-12 ENCOUNTER — Encounter (HOSPITAL_COMMUNITY): Payer: Self-pay | Admitting: Neurosurgery

## 2021-04-12 DIAGNOSIS — M96842 Postprocedural seroma of a musculoskeletal structure following a musculoskeletal system procedure: Secondary | ICD-10-CM | POA: Diagnosis not present

## 2021-04-12 NOTE — Evaluation (Signed)
Occupational Therapy Evaluation Patient Details Name: [name] MRN: [id number] DOB: 1961-10-02 Today's Date: 04/12/2021    History of Present Illness Pt is a 60 y.o. female admitted 04/11/21 with persistent seroma s/p lumbar fusion (11/02/20) with multiple debridements (12/05/20, 02/21/21); now s/p lumbar wound exploration and vac placement 4/13. Other PMH includes multiple sclerosis, HTN, obesity.   Clinical Impression   Patient evaluated by Occupational Therapy with no further acute OT needs identified. All education has been completed and the patient has no further questions. Pt demonstrates good safety awareness, she has all needed DME and AE and is able to perform ADLs mod I.   See below for any follow-up Occupational Therapy or equipment needs. OT is signing off. Thank you for this referral.      Follow Up Recommendations  No OT follow up;Supervision - Intermittent    Equipment Recommendations  None recommended by OT    Recommendations for Other Services       Precautions / Restrictions Precautions Precautions: Fall;Other (comment);Back Precaution Comments: Wound vac      Mobility Bed Mobility Overal bed mobility: Modified Independent                  Transfers Overall transfer level: Modified independent                    Balance                                           ADL either performed or assessed with clinical judgement   ADL Overall ADL's : Modified independent                                       General ADL Comments: Pt is able to verbalize how to safely use AE for LB ADLs and how to manage her IADLs safely.  She was able to perform toileting transfer, and grooming mod I using RW.     Vision         Perception     Praxis      Pertinent Vitals/Pain Pain Assessment: Faces Faces Pain Scale: Hurts little more Pain Location: lower back Pain Descriptors / Indicators: Discomfort;Operative  site guarding Pain Intervention(s): Monitored during session     Hand Dominance Right   Extremity/Trunk Assessment Upper Extremity Assessment Upper Extremity Assessment: Overall WFL for tasks assessed   Lower Extremity Assessment Lower Extremity Assessment: Overall WFL for tasks assessed       Communication Communication Communication: No difficulties   Cognition Arousal/Alertness: Awake/alert Behavior During Therapy: WFL for tasks assessed/performed Overall Cognitive Status: Within Functional Limits for tasks assessed                                     General Comments  Pt reports she has all needed DME and AE at home and is able to verbalize appropriate and safe use of all.  She demonstrates excellent safety awareness.    Exercises     Shoulder Instructions      Home Living Family/patient expects to be discharged to:: Private residence Living Arrangements: Alone Available Help at Discharge: Friend(s);Family;Available PRN/intermittently Type of Home: House Home Access: Stairs to enter [employer] of  Steps: 3 Entrance Stairs-Rails: Right;Left Home Layout: One level     Bathroom Shower/Tub: Chief Strategy Officer: Standard Bathroom Accessibility: Yes   Home Equipment: Cane - single point;Walker - 2 wheels;Tub bench;Adaptive equipment Adaptive Equipment: Reacher;Sock aid;Long-handled sponge;Long-handled shoe horn        Prior Functioning/Environment Level of Independence: Independent with assistive device(s)        Comments: Pt mod indep ambulating with SPC or RW since prior sxs; great awareness of back precautions, fall risk reduction, compensatory strategies, mod indep ADLs (uses reacher frequently). Goes grocery shopping, but calls friend to help carry groceries into home        OT Problem List: Decreased activity tolerance;Pain      OT Treatment/Interventions:      OT Goals(Current goals can be found in the  care plan section) Acute Rehab OT Goals Patient Stated Goal: to go home OT Goal Formulation: All assessment and education complete, DC therapy  OT Frequency:     Barriers to D/C:            Co-evaluation              AM-PAC OT "6 Clicks" Daily Activity     Outcome Measure Help from another person eating meals?: None Help from another person taking care of personal grooming?: None Help from another person toileting, which includes using toliet, bedpan, or urinal?: None Help from another person bathing (including washing, rinsing, drying)?: None Help from another person to put on and taking off regular upper body clothing?: None Help from another person to put on and taking off regular lower body clothing?: None 6 Click Score: 24   End of Session Equipment Utilized During Treatment: Rolling walker Nurse Communication: Mobility status  Activity Tolerance: Patient tolerated treatment well Patient left: Other (comment) (with PT)  OT Visit Diagnosis: Pain Pain - part of body:  (back)                Time: 4627-0350 OT Time Calculation (min): 14 min Charges:  OT General Charges $OT Visit: 1 Visit OT Evaluation $OT Eval Low Complexity: 1 Low  Eber Jones., OTR/L Acute Rehabilitation Services Pager [phone] Office [phone]   Jeani Hawking M 04/12/2021, 3:45 PM

## 2021-04-12 NOTE — Evaluation (Signed)
Physical Therapy Evaluation & Discharge Patient Details Name: [name] MRN: [id number] DOB: [birth year] Today's Date: 04/12/2021   History of Present Illness  Pt is a 60 y.o. female admitted 04/11/21 with persistent seroma s/p lumbar fusion (11/02/20) with multiple debridements (12/05/20, 02/21/21); now s/p lumbar wound exploration and vac placement 4/13. Other PMH includes multiple sclerosis, HTN, obesity.    Clinical Impression  Patient evaluated by Physical Therapy with no further acute PT needs identified. PTA, pt mod indep mobilizing with RW or SPC, lives alone with intermittent assist from family/friends. Pt with h/o multiple lumbar sxs and has great understanding of back precautions, mobility strategies and fall risk reduction. Today, pt mod indep with transfers, ambulation and stair training. All education has been completed and the patient has no further questions. Pt in agreement that she does not require additional HHPT services. Acute PT is signing off. Thank you for this referral.    Follow Up Recommendations No PT follow up    Equipment Recommendations  None recommended by PT    Recommendations for Other Services       Precautions / Restrictions Precautions Precautions: Fall;Other (comment);Back Precaution Comments: Wound vac Restrictions Weight Bearing Restrictions: No      Mobility  Bed Mobility Overal bed mobility: Modified Independent Bed Mobility: Sit to Supine           General bed mobility comments: Good ability to return to supine with bed flat    Transfers Overall transfer level: Modified independent Equipment used: Rolling walker (2 wheeled)                Ambulation/Gait Ambulation/Gait assistance: Modified independent (Device/Increase time) Gait Distance (Feet): 500 Feet Assistive device: Rolling walker (2 wheeled) Gait Pattern/deviations: Step-through pattern;Decreased stride length Gait velocity: Decreased   General Gait  Details: Slow, steady gait with RW mod indep; assist to manage heavy wound vac. Pt frequently stopping to converse and able to remove hands from RW  Stairs Stairs: Yes Stairs assistance: Modified independent (Device/Increase time) Stair Management: One rail Right;Step to pattern;Sideways Number of Stairs: 3    Wheelchair Mobility    Modified Rankin (Stroke Patients Only)       Balance Overall balance assessment: Needs assistance   Sitting balance-Leahy Scale: Good       Standing balance-Leahy Scale: Fair Standing balance comment: Dynamic stability improved with UE support                             Pertinent Vitals/Pain Pain Assessment: Faces Faces Pain Scale: Hurts little more Pain Location: lower back Pain Descriptors / Indicators: Discomfort;Operative site guarding Pain Intervention(s): Monitored during session    Home Living Family/patient expects to be discharged to:: Private residence Living Arrangements: Alone Available Help at Discharge: Friend(s);Family;Available PRN/intermittently Type of Home: House Home Access: Stairs to enter Entrance Stairs-Rails: Doctor, general practice of Steps: 3 Home Layout: One level Home Equipment: Cane - single point;Walker - 2 wheels      Prior Function Level of Independence: Independent with assistive device(s)         Comments: Pt mod indep ambulating with SPC or RW since prior sxs; great awareness of back precautions, fall risk reduction, compensatory strategies, mod indep ADLs (uses reacher frequently). Goes grocery shopping, but calls friend to help carry groceries into home     Hand Dominance        Extremity/Trunk Assessment   Upper Extremity Assessment Upper Extremity Assessment: Overall  WFL for tasks assessed    Lower Extremity Assessment Lower Extremity Assessment: Overall WFL for tasks assessed       Communication   Communication: No difficulties  Cognition  Arousal/Alertness: Awake/alert Behavior During Therapy: WFL for tasks assessed/performed Overall Cognitive Status: Within Functional Limits for tasks assessed                                        General Comments General comments (skin integrity, edema, etc.): Pt in agreement she does not require HHPT services - "the last girl discharged me and said, 'girl you're teaching me things'"    Exercises     Assessment/Plan    PT Assessment Patent does not need any further PT services  PT Problem List         PT Treatment Interventions      PT Goals (Current goals can be found in the Care Plan section)  Acute Rehab PT Goals PT Goal Formulation: All assessment and education complete, DC therapy    Frequency     Barriers to discharge        Co-evaluation               AM-PAC PT "6 Clicks" Mobility  Outcome Measure Help needed turning from your back to your side while in a flat bed without using bedrails?: None Help needed moving from lying on your back to sitting on the side of a flat bed without using bedrails?: None Help needed moving to and from a bed to a chair (including a wheelchair)?: None Help needed standing up from a chair using your arms (e.g., wheelchair or bedside chair)?: None Help needed to walk in hospital room?: None Help needed climbing 3-5 steps with a railing? : None 6 Click Score: 24    End of Session   Activity Tolerance: Patient tolerated treatment well Patient left: in bed;with call bell/phone within reach Nurse Communication: Mobility status PT Visit Diagnosis: Other abnormalities of gait and mobility (R26.89)    Time: 2353-6144 PT Time Calculation (min) (ACUTE ONLY): 21 min   Charges:   PT Evaluation $PT Eval Low Complexity: 1 Low     Ina Homes, PT, DPT Acute Rehabilitation Services  Pager [phone] Office [phone]  Malachy Chamber 04/12/2021, 9:50 AM

## 2021-04-12 NOTE — TOC Initial Note (Addendum)
Transition of Care [facility]) - Initial/Assessment Note    Patient Details  Name: [name] MRN: [id number] Date of Birth: 06-11-1961  [facility]) CM/SW Contact:    Kingsley Plan, RN Phone Number: 04/12/2021, 9:52 AM  Clinical Narrative:                  Patient from home.  Has walker already at home.   Needs home VAc and HHRN.   Left [facility] VAC application with bedside nurse for Dr Franky Macho to sign, also need wound description including length, width, and depth, also a HHRN order with amount of suction at home and how often VAC is to be changed.   Patient has had [facility] in the past and would like them again. NCM called Amy with [facility] , Amy will check with staffing to see if they can accept. Amy unable to accept due to staffing.  Confirmed face sheet information.   Amy with [facility] is unable to accept referral due to staffing.  [facility], [facility] , [facility], [facility] , [facility], [facility] , [facility] ,and [facility] all unable to accept referral.  [facility] can accept referral however start of care would not be until April 16, 2021 .  Called Dr Sueanne Margarita  office spoke to [place] explained above  was transferred to [employer] nurse and patient aware.   Will wait to talk to Dr Franky Macho   Expected Discharge Plan: Home w Home Health Services     Patient Goals and CMS Choice Patient states their goals for this hospitalization and ongoing recovery are:: to return to home CMS Medicare.gov Compare Post Acute Care list provided to:: Patient Choice offered to / list presented to : Patient  Expected Discharge Plan and Services Expected Discharge Plan: Home w Home Health Services     Post Acute Care Choice: Home Health Living arrangements for the past 2 months: Single Family Home                 DME Arranged: Vac DME Agency: [employer] Date DME Agency Contacted: 04/12/21 Time DME Agency Contacted: [phone]               Prior Living Arrangements/Services Living arrangements for the past 2 months: Single Family Home   Patient language and need for interpreter reviewed:: Yes Do you feel safe going back to the place where you live?: Yes      Need for Family Participation in Patient Care: Yes (Comment) Care giver support system in place?: Yes (comment) Current home services: DME Criminal Activity/Legal Involvement Pertinent to Current Situation/Hospitalization: No - Comment as needed  Activities of Daily Living Home Assistive Devices/Equipment: Cane (specify quad or straight),Walker (specify type),Built-in shower seat,Grab bars in shower ADL Screening (condition at time of admission) Patient's cognitive ability adequate to safely complete daily activities?: Yes Is the patient deaf or have difficulty hearing?: No Does the patient have difficulty seeing, even when wearing glasses/contacts?: No Does the patient have difficulty concentrating, remembering, or making decisions?: No Patient able to express need for assistance with ADLs?: Yes Does the patient have difficulty dressing or bathing?: No Independently performs ADLs?: Yes (appropriate for developmental age) Does the patient have difficulty walking or climbing stairs?: Yes Weakness of Legs: Right Weakness of Arms/Hands: None  Permission Sought/Granted   Permission granted to share information with : No              Emotional Assessment  Appearance:: Appears stated age Attitude/Demeanor/Rapport: Engaged Affect (typically observed): Accepting Orientation: : Oriented to Self,Oriented to Place,Oriented to  Time,Oriented to Situation Alcohol / Substance Use: Not Applicable Psych Involvement: No (comment)  Admission diagnosis:  Postoperative seroma of subcutaneous tissue after non-dermatologic procedure [L76.34] Patient Active Problem List   Diagnosis Date Noted  . Postoperative seroma of subcutaneous tissue after non-dermatologic  procedure 04/11/2021  . Wound drainage 02/21/2021  . Wound dehiscence 02/21/2021  . Postoperative seroma of musculoskeletal structure after musculoskeletal procedure 12/05/2020  . Spondylolisthesis of lumbar region 11/02/2020  . Insulin resistance 02/15/2020  . Class 3 severe obesity with serious comorbidity and body mass index (BMI) of 45.0 to 49.9 in adult (HCC) 01/19/2020  . Vitamin D deficiency 01/18/2020  . Other hyperlipidemia 02/22/2019  . Primary osteoarthritis of both knees 10/14/2018  . 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 Essential hypertension 02/16/2016  . Multiple sclerosis (HCC) 04/06/2013   PCP:  Loman Brooklyn, MD Pharmacy:   [facility] [phone] [place], [place] - [address] [place] [address] Phone: [phone] Fax: [phone]     Social Determinants of Health (SDOH) Interventions    Readmission Risk Interventions No flowsheet data found.

## 2021-04-12 NOTE — Progress Notes (Signed)
Patient ID: [name], female   DOB: June 29, 1961, 59 y.o.   MRN: [id number] BP (!) 145/78 (BP Location: Right Arm)   Pulse 93   Temp 98.4 F (36.9 C) (Oral)   Resp 16   Ht 5\' 3"  (1.6 m)   Wt 120.2 kg   SpO2 98%   BMI 46.94 kg/m  Alert, oriented x 4 Speech is clear and fluent Moving all extremities Dressing in place Arranging for home care

## 2021-04-13 DIAGNOSIS — M96842 Postprocedural seroma of a musculoskeletal structure following a musculoskeletal system procedure: Secondary | ICD-10-CM | POA: Diagnosis not present

## 2021-04-13 MED ORDER — HYDROCODONE-ACETAMINOPHEN 7.5-325 MG PO TABS: 1.0000 | ORAL_TABLET | ORAL | 0 refills | Status: DC | PRN

## 2021-04-13 NOTE — Discharge Summary (Signed)
Physician Discharge Summary  Patient ID: [name] MRN: [id number] DOB/AGE: [birth year] 60 y.o.  Admit date: 04/11/2021 Discharge date: 04/13/2021  Admission Diagnoses: Lumbar wound seroma  Discharge Diagnoses: Lumbar wound seroma Active Problems:   Postoperative seroma of subcutaneous tissue after non-dermatologic procedure   Discharged Condition: good  Hospital Course: Patient was admitted to undergo drainage of the lumbar wound seroma.  A wound VAC is now being placed.  Consults: None  Significant Diagnostic Studies: None  Treatments: surgery: Drainage of lumbar wound seroma  Discharge Exam: Blood pressure 137/75, pulse 77, temperature 97.9 F (36.6 C), temperature source Oral, resp. rate 16, height 5\' 3"  (1.6 m), weight 120.2 kg, SpO2 100 %. Incision has a wound VAC on it.  Motor function remains intact.  Disposition: Discharge disposition: 01-Home or Self Care       Discharge Instructions    Call MD for:  redness, tenderness, or signs of infection (pain, swelling, redness, odor or green/yellow discharge around incision site)   Complete by: As directed    Call MD for:  severe uncontrolled pain   Complete by: As directed    Call MD for:  temperature >100.4   Complete by: As directed    Diet - low sodium heart healthy   Complete by: As directed    Discharge wound care:   Complete by: As directed    Wound care per ostomy nurse   Incentive spirometry RT   Complete by: As directed    Increase activity slowly   Complete by: As directed      Allergies as of 04/13/2021   No Known Allergies     Medication List    TAKE these medications   gabapentin 300 MG capsule Commonly known as: NEURONTIN Take 300 mg by mouth 3 (three) times daily.   hydrochlorothiazide 25 MG tablet Commonly known as: HYDRODIURIL Take 25 mg by mouth daily.   HYDROcodone-acetaminophen 7.5-325 MG tablet Commonly known as: NORCO Take 1 tablet by mouth every 4 (four) hours as  needed for moderate pain or severe pain. What changed:   when to take this  reasons to take this   losartan 100 MG tablet Commonly known as: COZAAR Take 100 mg by mouth daily.   modafinil 200 MG tablet Commonly known as: PROVIGIL Take 200 mg by mouth See admin instructions. Morning and Midday by 1400-1500   multivitamin with minerals tablet Take 1 tablet by mouth daily. 55 +   rosuvastatin 10 MG tablet Commonly known as: CRESTOR Take 10 mg by mouth at bedtime.   Teriflunomide 14 MG Tabs Take 14 mg by mouth daily. Aubagio   tiZANidine 4 MG tablet Commonly known as: ZANAFLEX Take 1 tablet (4 mg total) by mouth every 8 (eight) hours as needed for muscle spasms.   Vitamin D-3 125 MCG (5000 UT) Tabs Take 15,000 Units by mouth daily.            Discharge Care Instructions  (From admission, onward)         Start     Ordered   04/13/21 0000  Discharge wound care:       Comments: Wound care per ostomy nurse   04/13/21 1119          Follow-up Information    04/15/21, MD Follow up.   Specialty: Neurosurgery Contact information: [address] [place] [phone]               Signed: [phone]  Daliah Chaudoin 04/13/2021, 11:19 AM

## 2021-04-13 NOTE — Progress Notes (Signed)
Patient alert and oriented, voiding adequately, MAE well with no difficulty. Incision area cdi with no s/s of infection. Patient discharged home per order. Patient and family stated understanding of discharge instructions given. Patient has an appointment with Dr. Franky Macho.

## 2021-04-13 NOTE — Plan of Care (Signed)
Adequately ready for discharged 

## 2021-04-13 NOTE — Care Management (Addendum)
Start of care [facility] is M[facility], discussed yesterday with Dr Franky Macho and patient.   Called [employer] with [employer]. Home VAC is still under review , they are aware patient being discharged today. [place] will call as soon as insurance has approved.   [address] with [employer] will deliver home VAC to hospital room in 30 minutes. NCM called unit and notified  Ronny Flurry RN

## 2021-04-16 LAB — AEROBIC/ANAEROBIC CULTURE W GRAM STAIN (SURGICAL/DEEP WOUND): Culture: NO GROWTH

## 2021-04-19 ENCOUNTER — Other Ambulatory Visit: Payer: 59

## 2021-06-07 ENCOUNTER — Other Ambulatory Visit (HOSPITAL_BASED_OUTPATIENT_CLINIC_OR_DEPARTMENT_OTHER): Payer: Self-pay | Admitting: Neurosurgery

## 2021-06-07 DIAGNOSIS — M96842 Postprocedural seroma of a musculoskeletal structure following a musculoskeletal system procedure: Secondary | ICD-10-CM

## 2021-06-16 ENCOUNTER — Encounter (HOSPITAL_BASED_OUTPATIENT_CLINIC_OR_DEPARTMENT_OTHER): Payer: Self-pay

## 2021-06-16 ENCOUNTER — Other Ambulatory Visit: Payer: Self-pay

## 2021-06-16 ENCOUNTER — Ambulatory Visit (HOSPITAL_BASED_OUTPATIENT_CLINIC_OR_DEPARTMENT_OTHER)
Admission: RE | Admit: 2021-06-16 | Discharge: 2021-06-16 | Disposition: A | Discharge status: Home / Self Care | Payer: 59 | Source: Ambulatory Visit | Attending: Neurosurgery | Admitting: Neurosurgery

## 2021-06-16 DIAGNOSIS — M96842 Postprocedural seroma of a musculoskeletal structure following a musculoskeletal system procedure: Secondary | ICD-10-CM

## 2021-06-16 IMAGING — MR MR LUMBAR SPINE WO/W CM
4 of 7 series · 21 of 48 positions shown · IV contrast (gadavist)
Comparison: MRI lumbar spine [DATE]

CLINICAL DATA: Postoperative seroma of musculoskeletal structure
after musculoskeletal procedure. Abscess debridement in [REDACTED].

EXAM:
MRI LUMBAR SPINE WITHOUT AND WITH CONTRAST
TECHNIQUE: Multiplanar and multiecho pulse sequences of the lumbar spine were
obtained without and with intravenous contrast.
CONTRAST:  10mL GADAVIST GADOBUTROL 1 MMOL/ML IV SOLN

[Series 3: T1 · sagittal · 4.0mm · 0.81mm/px · 5 of 16 slices shown (1 of 2)]
[im 1/16]
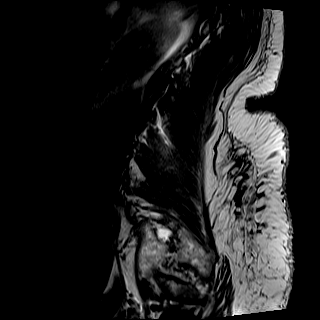
[im 4/16]
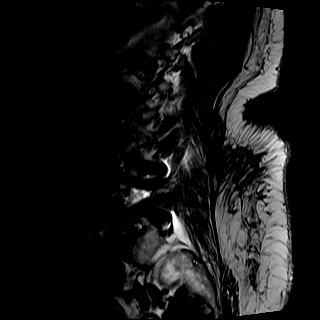
[im 8/16]
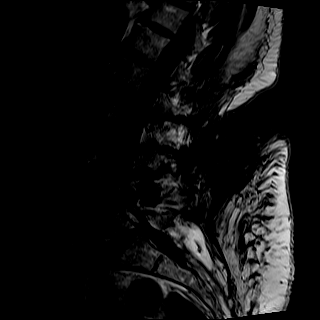
[im 12/16]
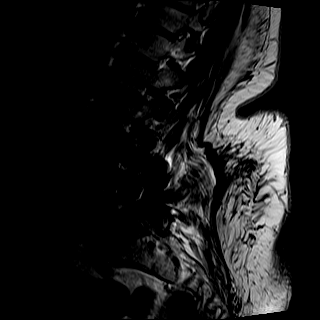
[im 16/16]
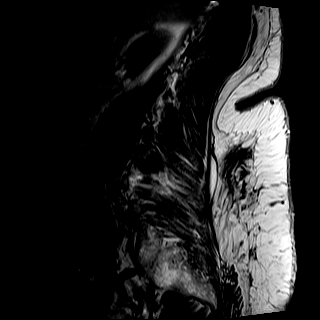

[Series 5: T2 · axial · 4.0mm · 0.43mm/px · z∈[-68,+123]mm · 8 of 37 slices shown (1 of 2)]
[im 1/37]
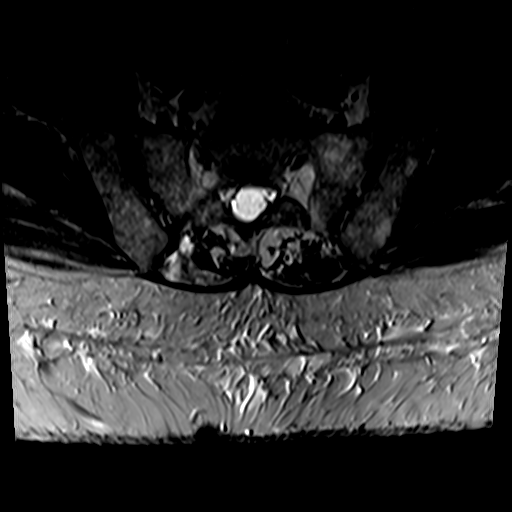
[im 5/37]
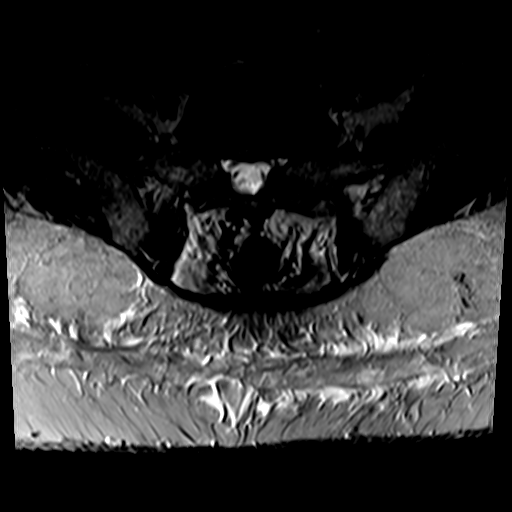
[im 13/37]
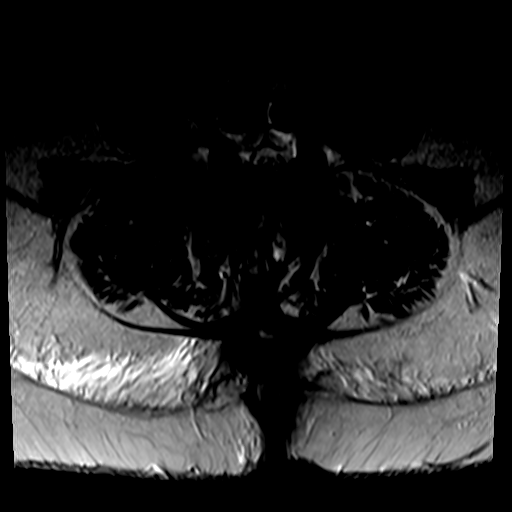
[im 17/37]
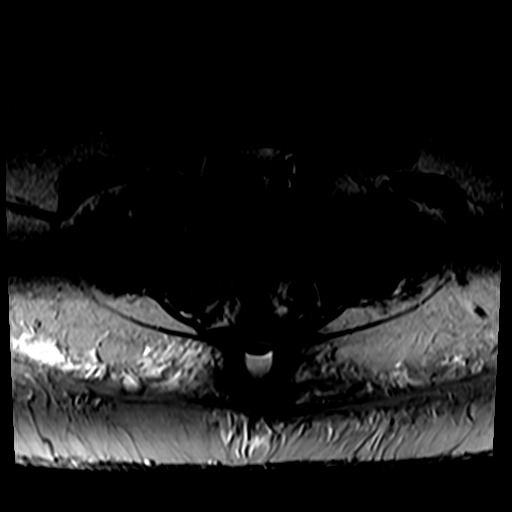
[im 21/37]
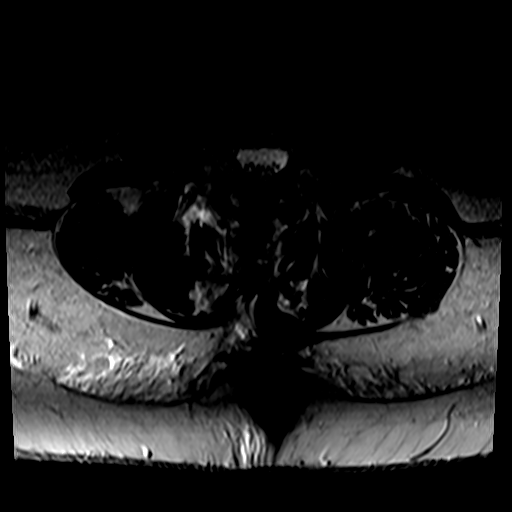
[im 25/37]
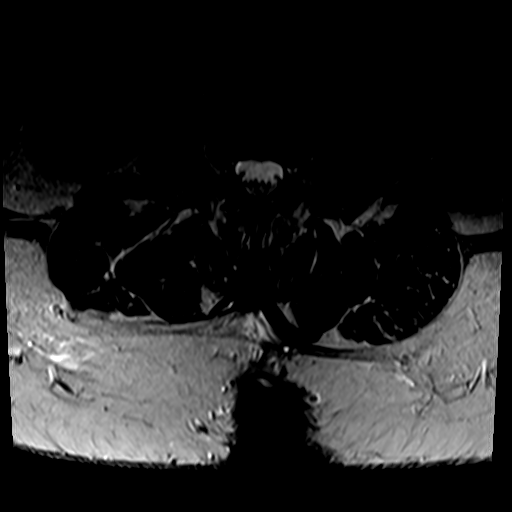
[im 33/37]
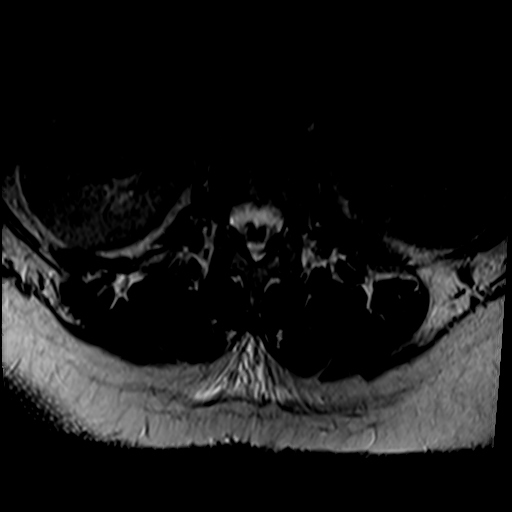
[im 37/37]
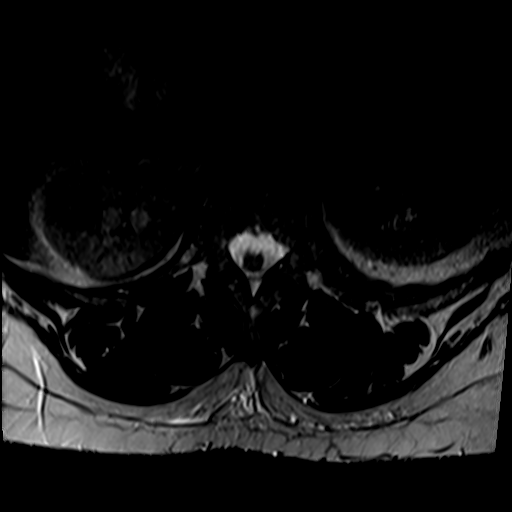

[Series 6: T1 · axial · 4.0mm · 0.39mm/px · z∈[-66,+102]mm · 4 of 37 slices shown (2 of 2)]
[im 1/37]
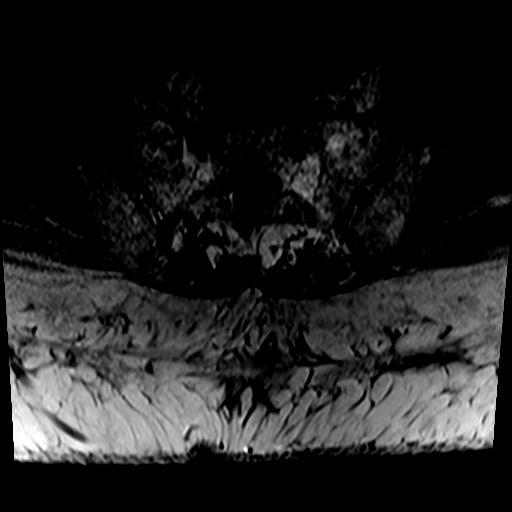
[im 5/37]
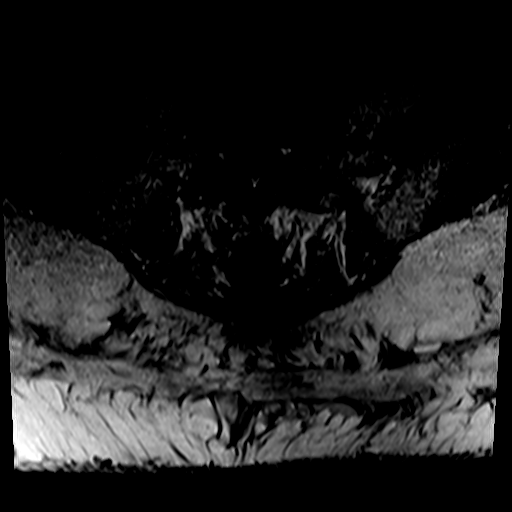
[im 21/37]
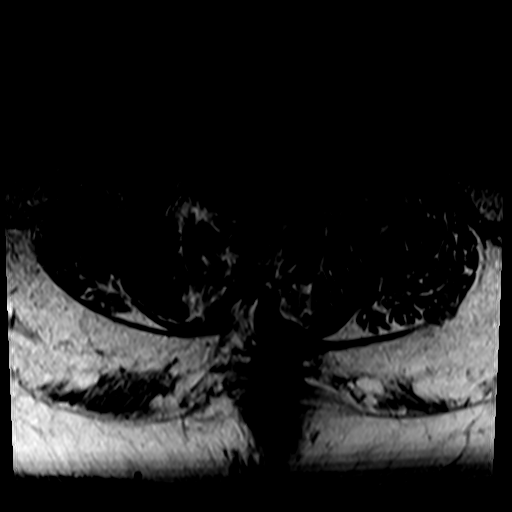
[im 33/37]
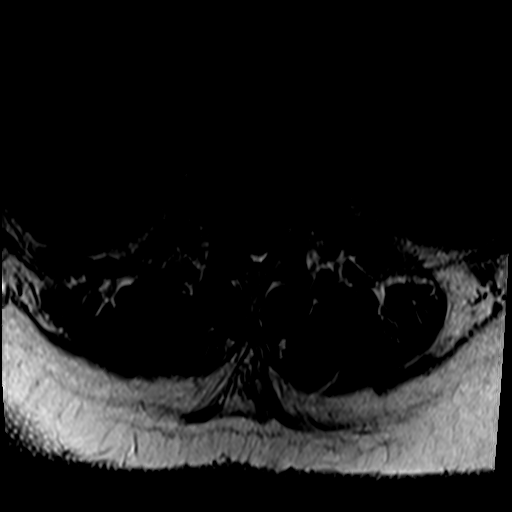

[Series 7: T2 · sagittal · 4.0mm · 0.51mm/px · 4 of 16 slices shown (2 of 2)]
[im 1/16]
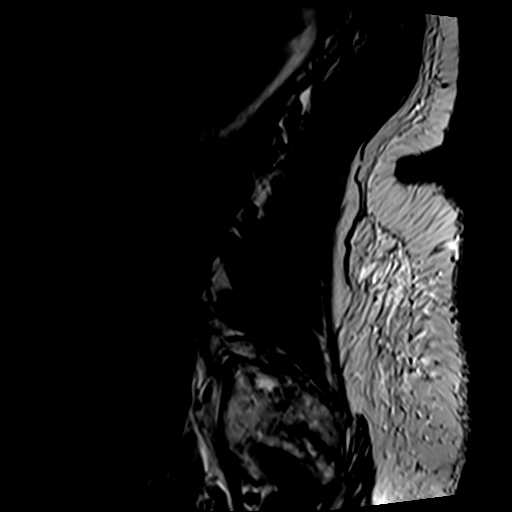
[im 6/16]
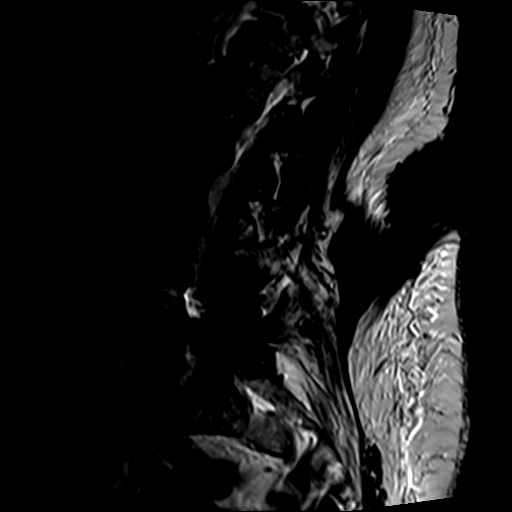
[im 11/16]
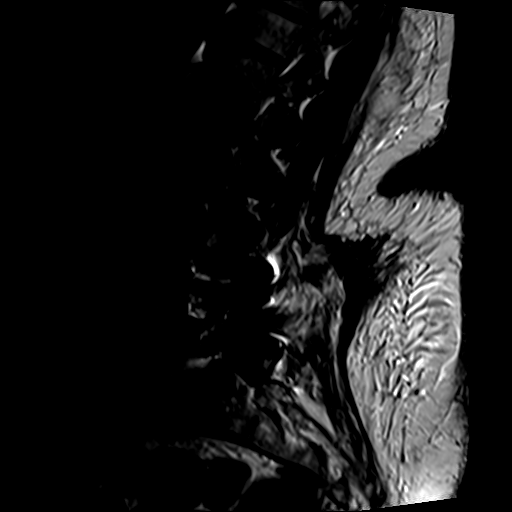
[im 16/16]
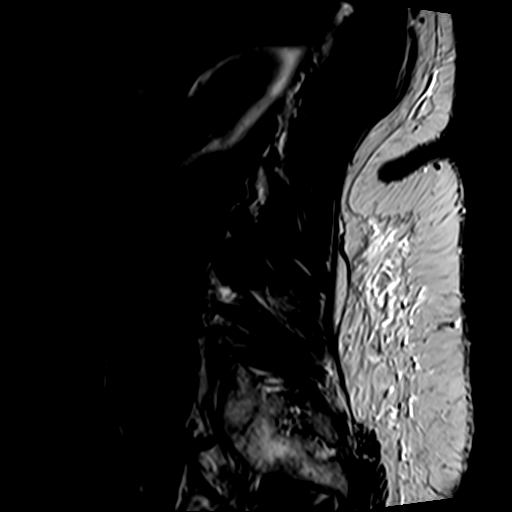

[21 of 48 positions shown; findings below may reference images not displayed]

FINDINGS: Segmentation:  Normal

Alignment: Mild retrolisthesis L3-4. 5 mm anterolisthesis L4-5,
unchanged.

Vertebrae: Negative for fracture or mass. No evidence of discitis or
osteomyelitis. Pedicle screw and interbody fusion L4-5.

Conus medullaris and cauda equina: Conus extends to the L1-2 level.
Conus and cauda equina appear normal.

Paraspinal and other soft tissues: Interval improvement in fluid
collection in the posterior soft tissues located in the subcutaneous
fat extending down to the fascia. No definite deep soft tissue
extension. Fluid collection is smaller and contains an air-fluid
level and communicates with the skin surface. There is surrounding
soft tissue enhancement. No epidural abscess.

Disc levels:

L1-2: Disc bulging and facet degeneration. Mild subarticular
stenosis bilaterally. No interval change

L2-3: Mild disc degeneration and disc bulging. Mild facet
degeneration. No significant stenosis

L3-4: Disc degeneration with disc bulging and endplate spurring.
Mild facet degeneration. Mild spinal stenosis. No interval change

L4-5: PLIF. No significant spinal stenosis. Mild right foraminal
narrowing. Negative for epidural abscess

L5-S1: Central disc protrusion unchanged. Moderate central canal
stenosis. Bilateral subarticular and foraminal stenosis appear
unchanged.
IMPRESSION: Subcutaneous fluid collection posteriorly at the L4-5 level has
improved. This appears to communicate with the skin surface and
contains an air-fluid level. No extension deep to the fascia.

PLIF L4-5. Stable degenerative changes in the lumbar spine as
described above.

## 2021-06-23 ENCOUNTER — Other Ambulatory Visit: Payer: Self-pay

## 2021-06-23 ENCOUNTER — Ambulatory Visit (HOSPITAL_BASED_OUTPATIENT_CLINIC_OR_DEPARTMENT_OTHER)
Admission: RE | Admit: 2021-06-23 | Discharge: 2021-06-23 | Disposition: A | Discharge status: Home / Self Care | Payer: 59 | Source: Ambulatory Visit | Attending: Neurosurgery | Admitting: Neurosurgery

## 2021-06-23 DIAGNOSIS — M96842 Postprocedural seroma of a musculoskeletal structure following a musculoskeletal system procedure: Secondary | ICD-10-CM | POA: Insufficient documentation

## 2021-06-23 MED ORDER — GADOBUTROL 1 MMOL/ML IV SOLN
10.0000 mL | Freq: Once | INTRAVENOUS | Status: AC | PRN
  Administered 2021-06-23: 10 mL via INTRAVENOUS

## 2021-10-04 ENCOUNTER — Other Ambulatory Visit (HOSPITAL_BASED_OUTPATIENT_CLINIC_OR_DEPARTMENT_OTHER): Payer: Self-pay | Admitting: Neurosurgery

## 2021-10-04 DIAGNOSIS — M96842 Postprocedural seroma of a musculoskeletal structure following a musculoskeletal system procedure: Secondary | ICD-10-CM

## 2021-10-06 ENCOUNTER — Other Ambulatory Visit: Payer: Self-pay

## 2021-10-06 ENCOUNTER — Ambulatory Visit (HOSPITAL_BASED_OUTPATIENT_CLINIC_OR_DEPARTMENT_OTHER)
Admission: RE | Admit: 2021-10-06 | Discharge: 2021-10-06 | Disposition: A | Discharge status: Home / Self Care | Payer: 59 | Source: Ambulatory Visit | Attending: Neurosurgery | Admitting: Neurosurgery

## 2021-10-06 DIAGNOSIS — M96842 Postprocedural seroma of a musculoskeletal structure following a musculoskeletal system procedure: Secondary | ICD-10-CM | POA: Insufficient documentation

## 2021-10-06 IMAGING — MR MR LUMBAR SPINE WO/W CM
4 of 9 series · 22 of 48 positions shown · IV contrast (GADAVIST)
Comparison: MRI of the lumbar spine [DATE].

CLINICAL DATA: Postoperative seroma of musculoskeletal structure
after musculoskeletal procedure.

EXAM:
MRI LUMBAR SPINE WITHOUT AND WITH CONTRAST
TECHNIQUE: Multiplanar and multiecho pulse sequences of the lumbar spine were
obtained without and with intravenous contrast.
CONTRAST:  10mL GADAVIST GADOBUTROL 1 MMOL/ML IV SOLN

[Series 3: T2 · sagittal · 4.0mm · 0.81mm/px · 4 of 16 slices shown]
[im 1/16]
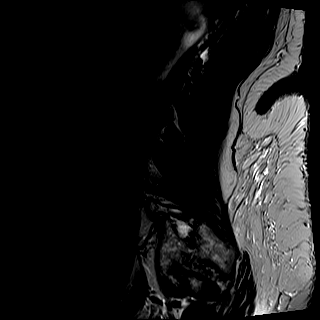
[im 6/16]
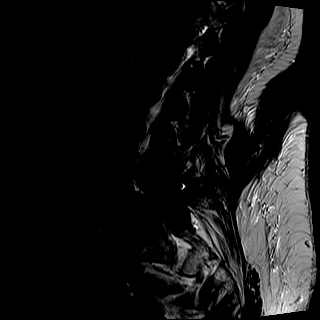
[im 11/16]
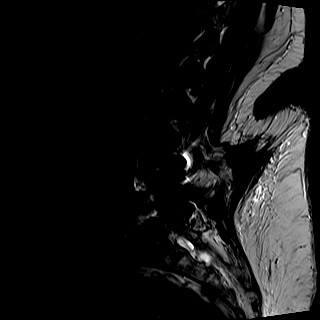
[im 16/16]
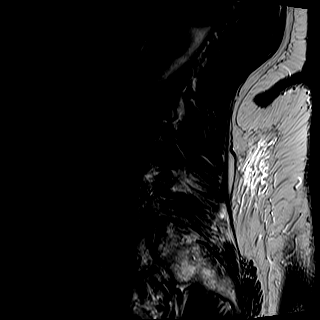

[Series 5: T1 · sagittal · 4.0mm · 0.81mm/px · 4 of 16 slices shown (1 of 2)]
[im 1/16]
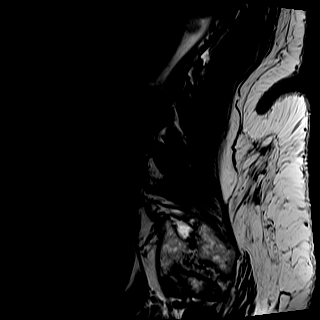
[im 6/16]
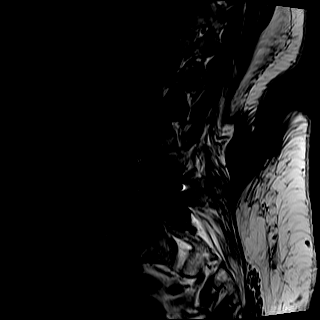
[im 11/16]
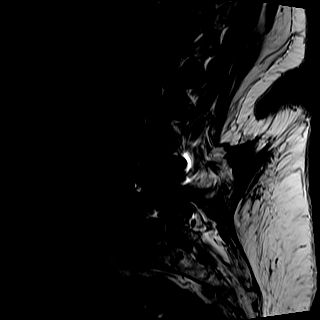
[im 16/16]
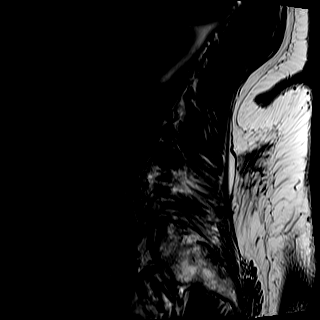

[Series 6: T1 · axial · 4.0mm · 0.39mm/px · z∈[+16,+179]mm · 6 of 34 slices shown (2 of 2)]
[im 1/34]
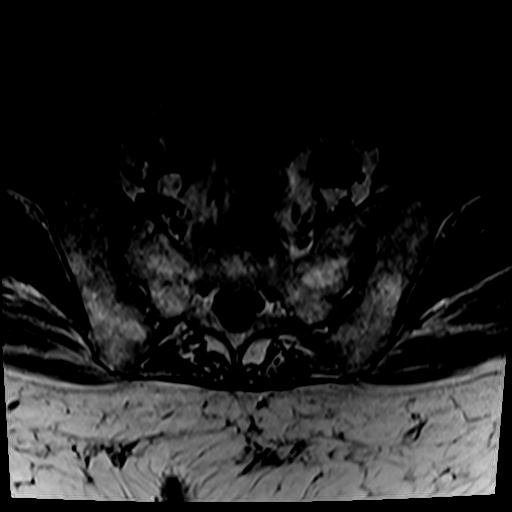
[im 5/34]
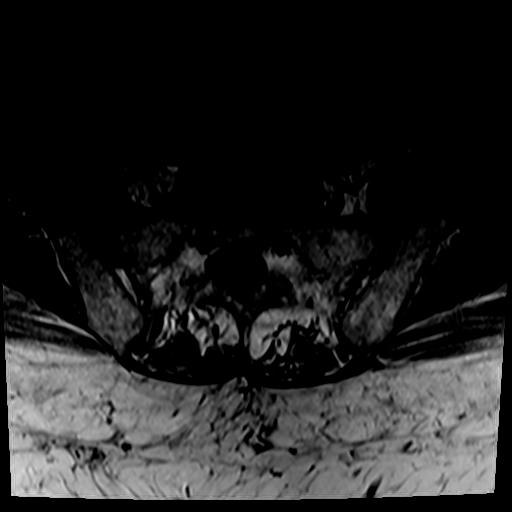
[im 10/34]
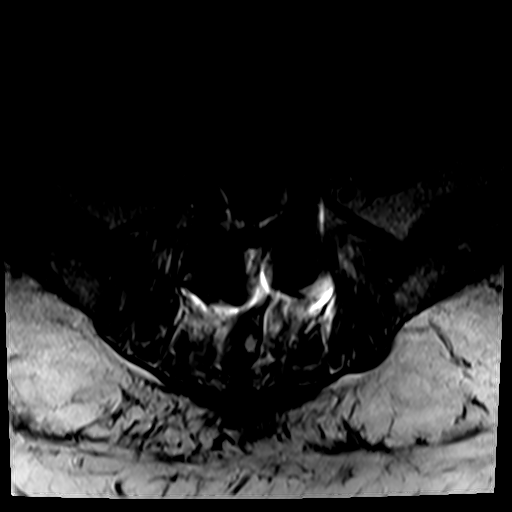
[im 15/34]
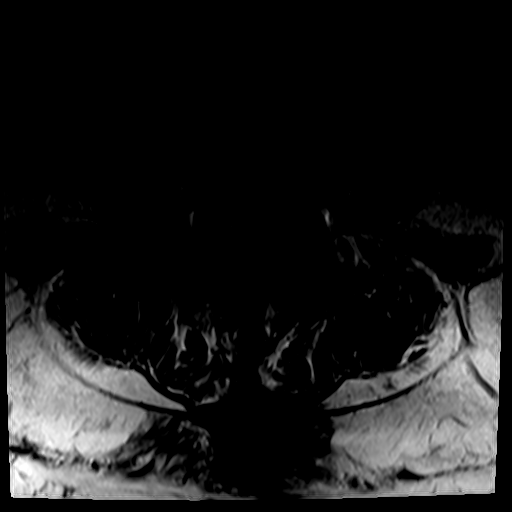
[im 19/34]
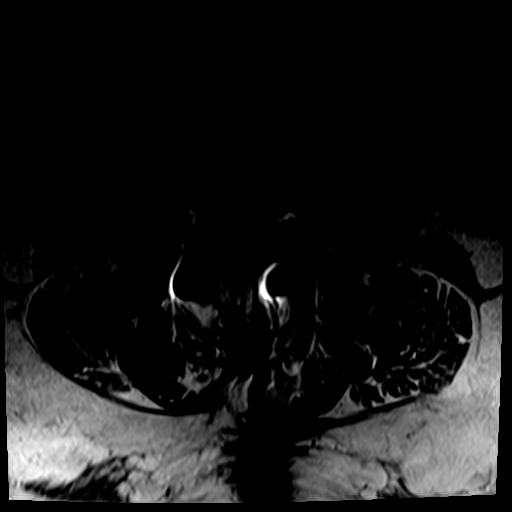
[im 29/34]
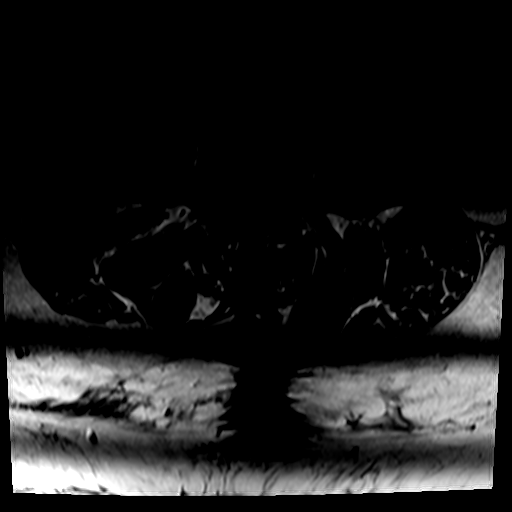

[Series 7: T2 post-contrast · axial · 4.0mm · 0.39mm/px · z∈[+16,+204]mm · 8 of 34 slices shown]
[im 1/34]
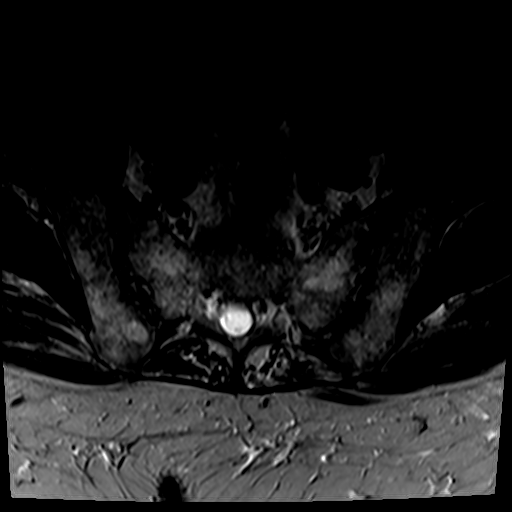
[im 5/34]
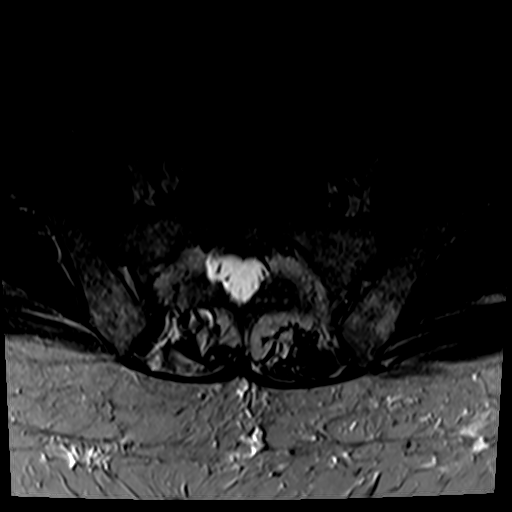
[im 10/34]
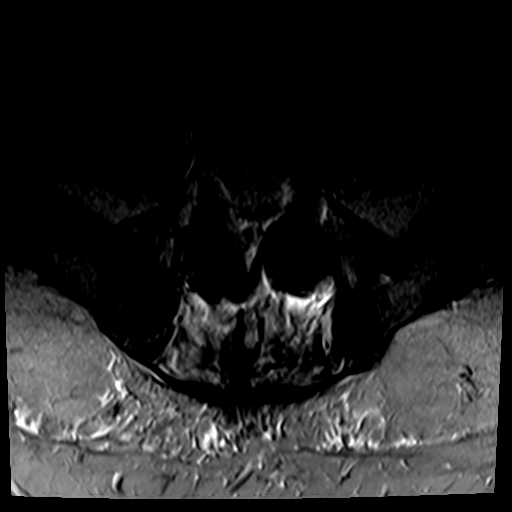
[im 15/34]
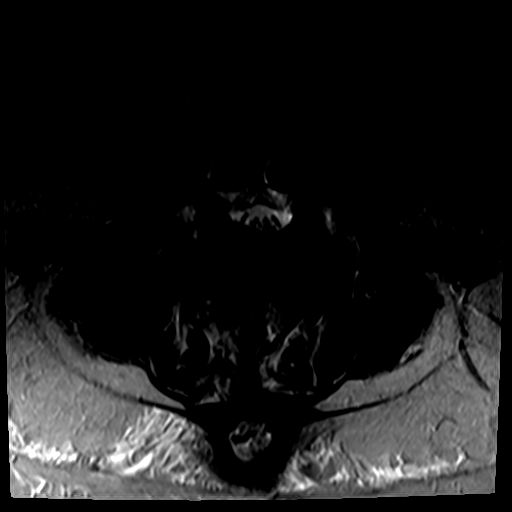
[im 19/34]
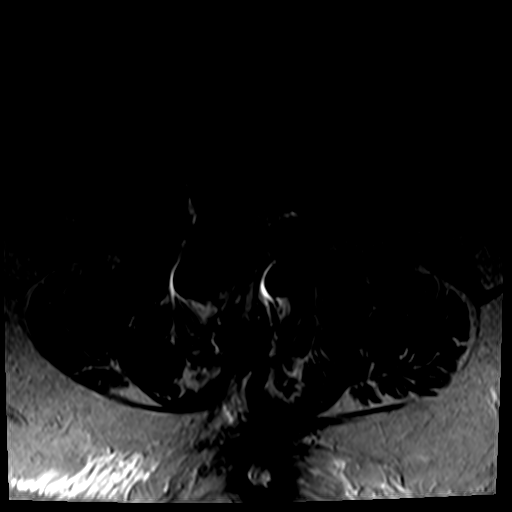
[im 24/34]
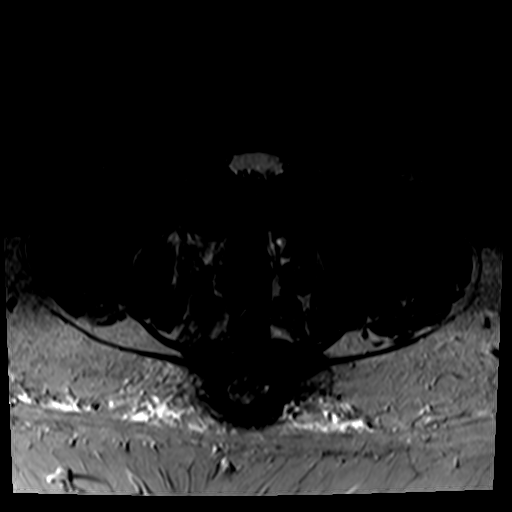
[im 29/34]
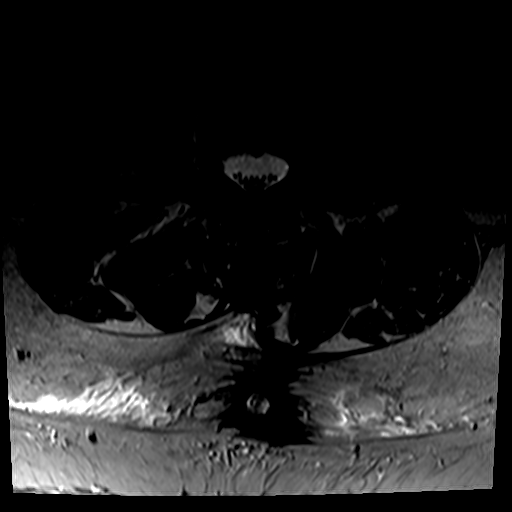
[im 34/34]
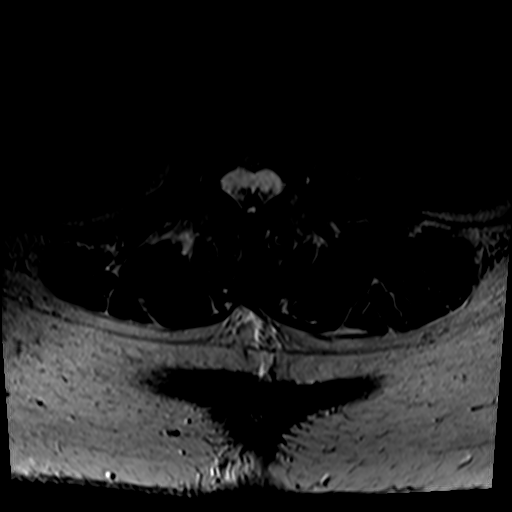

[22 of 48 positions shown; findings below may reference images not displayed]

FINDINGS: Segmentation:  Standard.

Alignment:  A 6 mm anterolisthesis of L4 over L5.

Vertebrae: No fracture, evidence of discitis, or bone lesion.
Postsurgical changes from interbody fusion and posterior fixation at
L4-5.

Conus medullaris and cauda equina: Conus extends to the L1-2 level.
Conus and cauda equina appear normal.

Paraspinal and other soft tissues: No significant interval change of
the open wound in the skin/subcutaneous of the lumbar region at the
L2 level with tract extending to the fascia at the L4-5 level. The
tract contains fluid with air bubbles with thickened enhancing walls
and surrounding soft tissue edema. Findings have not significantly
changed from prior MRI.

Disc levels:

T12: Shallow disc bulge resulting in mild bilateral neural foraminal
narrowing. No spinal canal stenosis.

L1-2: Shallow disc bulge and mild facet degenerative changes
resulting in mild-to-moderate bilateral neural foraminal narrowing.
No significant change from prior.

L2-3: Disc bulge and moderate facet degenerative changes resulting
mild bilateral neural foraminal narrowing. No significant spinal
canal stenosis.

L3-4: Disc bulge and moderate facet degenerative changes resulting
in mild spinal canal stenosis and mild-to-moderate bilateral neural
foraminal narrowing. No significant change from prior.

L4-5: Postsurgical changes from decompression and fusion. Mild disc
bulge/disc uncovering. Mild-to-moderate right and mild left neural
foraminal narrowing. T1 hypointense, enhancing tissue is seen
surrounding the thecal sac, consistent with postsurgical granulation
tissue/peridural fibrosis. No spinal canal stenosis.

L5-S1: Disc bulge with superimposed small right central disc
protrusion and moderate facet degenerative changes with ligamentum
flavum redundancy resulting in moderate spinal canal stenosis with
narrowing of the bilateral subarticular zones and moderate bilateral
neural foraminal narrowing. No significant change from prior.
IMPRESSION: 1. No significant interval change of the subcutaneous fluid
collection/tract with open skin wound in the lower lumbar region
extending to the level of the fascia.
2. Stable degenerative changes of the lumbar spine, more pronounced
at L5-S1 where there is moderate spinal canal stenosis and moderate
bilateral neural foraminal narrowing.

## 2021-10-06 MED ORDER — GADOBUTROL 1 MMOL/ML IV SOLN
10.0000 mL | Freq: Once | INTRAVENOUS | Status: AC | PRN
  Administered 2021-10-06: 10 mL via INTRAVENOUS

## 2021-10-15 ENCOUNTER — Encounter: Payer: 59 | Attending: Physician Assistant | Admitting: Physician Assistant

## 2021-10-15 ENCOUNTER — Other Ambulatory Visit: Payer: Self-pay

## 2021-10-15 ENCOUNTER — Other Ambulatory Visit
Admission: RE | Admit: 2021-10-15 | Discharge: 2021-10-15 | Disposition: A | Discharge status: Home / Self Care | Payer: 59 | Source: Ambulatory Visit | Attending: Physician Assistant | Admitting: Physician Assistant

## 2021-10-15 DIAGNOSIS — G35 Multiple sclerosis: Secondary | ICD-10-CM | POA: Insufficient documentation

## 2021-10-15 DIAGNOSIS — I10 Essential (primary) hypertension: Secondary | ICD-10-CM | POA: Insufficient documentation

## 2021-10-15 DIAGNOSIS — B999 Unspecified infectious disease: Secondary | ICD-10-CM | POA: Diagnosis not present

## 2021-10-15 DIAGNOSIS — X58XXXA Exposure to other specified factors, initial encounter: Secondary | ICD-10-CM | POA: Insufficient documentation

## 2021-10-15 DIAGNOSIS — L98422 Non-pressure chronic ulcer of back with fat layer exposed: Secondary | ICD-10-CM | POA: Insufficient documentation

## 2021-10-15 DIAGNOSIS — T8131XA Disruption of external operation (surgical) wound, not elsewhere classified, initial encounter: Secondary | ICD-10-CM | POA: Insufficient documentation

## 2021-10-18 ENCOUNTER — Other Ambulatory Visit: Payer: Self-pay

## 2021-10-18 DIAGNOSIS — T8131XA Disruption of external operation (surgical) wound, not elsewhere classified, initial encounter: Secondary | ICD-10-CM | POA: Diagnosis present

## 2021-10-18 DIAGNOSIS — G35 Multiple sclerosis: Secondary | ICD-10-CM | POA: Diagnosis not present

## 2021-10-18 DIAGNOSIS — L98422 Non-pressure chronic ulcer of back with fat layer exposed: Secondary | ICD-10-CM | POA: Diagnosis not present

## 2021-10-18 DIAGNOSIS — X58XXXA Exposure to other specified factors, initial encounter: Secondary | ICD-10-CM | POA: Insufficient documentation

## 2021-10-18 DIAGNOSIS — I10 Essential (primary) hypertension: Secondary | ICD-10-CM | POA: Insufficient documentation

## 2021-10-18 LAB — AEROBIC CULTURE W GRAM STAIN (SUPERFICIAL SPECIMEN)

## 2021-10-18 NOTE — Progress Notes (Addendum)
AJNA, MOORS ([id number]) Visit Report for 10/18/2021 Arrival Information Details Patient Name: [name], [name] Date of Service: 10/18/2021 10:45 AM Medical Record Number: [id number] Patient Account Number: [id number] Date of Birth/Sex: 1961/01/27 (60 y.o. F) Treating RN: Rogers Blocker Primary Care Creston Klas: [name] Other Clinician: Referring Melizza Kanode: Card, John Treating Dewarren Ledbetter/Extender: Rowan Blase in Treatment: 0 Visit Information History Since Last Visit Pain Present Now: No Patient Arrived: Walker Arrival Time: 11:00 Accompanied By: self Transfer Assistance: None Patient Identification Verified: Yes Secondary Verification Process Completed: Yes Patient Requires Transmission-Based Precautions: No Patient Has Alerts: No Electronic Signature(s) Signed: 10/18/2021 11:52:27 AM By: Rogers Blocker RN Entered By: Rogers Blocker on 10/18/2021 11:52:27 Brawn, Madisynn Salena Saner ([id number]) -------------------------------------------------------------------------------- Clinic Level of Care Assessment Details Patient Name: Muzzy, Maghen C. Date of Service: 10/18/2021 10:45 AM Medical Record Number: [id number] Patient Account Number: [id number] Date of Birth/Sex: 1961/11/30 (60 y.o. F) Treating RN: Rogers Blocker Primary Care Farren Landa: Card, Jonny Ruiz Other Clinician: Referring Levada Bowersox: Card, John Treating Kyshon Tolliver/Extender: Rowan Blase in Treatment: 0 Clinic Level of Care Assessment Items TOOL 4 Quantity Score X - Use when only an EandM is performed on FOLLOW-UP visit 1 0 ASSESSMENTS - Nursing Assessment / Reassessment X - Reassessment of Co-morbidities (includes updates in patient status) 1 10 X- 1 5 Reassessment of Adherence to Treatment Plan ASSESSMENTS - Wound and Skin Assessment / Reassessment X - Simple Wound Assessment / Reassessment - one wound 1 5 []  - 0 Complex Wound Assessment / Reassessment - multiple wounds []  - 0 Dermatologic / Skin Assessment  (not related to wound area) ASSESSMENTS - Focused Assessment []  - Circumferential Edema Measurements - multi extremities 0 []  - 0 Nutritional Assessment / Counseling / Intervention []  - 0 Lower Extremity Assessment (monofilament, tuning fork, pulses) []  - 0 Peripheral Arterial Disease Assessment (using hand held doppler) ASSESSMENTS - Ostomy and/or Continence Assessment and Care []  - Incontinence Assessment and Management 0 []  - 0 Ostomy Care Assessment and Management (repouching, etc.) PROCESS - Coordination of Care X - Simple Patient / Family Education for ongoing care 1 15 []  - 0 Complex (extensive) Patient / Family Education for ongoing care []  - 0 Staff obtains , Records, Test Results / Process Orders []  - 0 Staff telephones HHA, Nursing Homes / Clarify orders / etc []  - 0 Routine Transfer to another Facility (non-emergent condition) []  - 0 Routine Hospital Admission (non-emergent condition) []  - 0 New Admissions / / Ordering NPWT, Apligraf, etc. []  - 0 Emergency Hospital Admission (emergent condition) X- 1 10 Simple Discharge Coordination []  - 0 Complex (extensive) Discharge Coordination PROCESS - Special Needs []  - Pediatric / Minor Patient Management 0 []  - 0 Isolation Patient Management []  - 0 Hearing / Language / Visual special needs []  - 0 Assessment of Community assistance (transportation, D/C planning, etc.) []  - 0 Additional assistance / Altered mentation []  - 0 Support Surface(s) Assessment (bed, cushion, seat, etc.) INTERVENTIONS - Wound Cleansing / Measurement Rydberg, Marchel C. ( ) X- 1 5 Simple Wound Cleansing - one wound []  - 0 Complex Wound Cleansing - multiple wounds []  - 0 Wound Imaging (photographs - any number of wounds) []  - 0 Wound Tracing (instead of photographs) []  - 0 Simple Wound Measurement - one wound []  - 0 Complex Wound Measurement - multiple wounds INTERVENTIONS - Wound  Dressings []  - Small Wound Dressing one or multiple wounds 0 X- 1 15 Medium Wound Dressing one or multiple wounds []  - 0 Large Wound Dressing one  or multiple wounds []  - 0 Application of Medications - topical []  - 0 Application of Medications - injection INTERVENTIONS - Miscellaneous []  - External ear exam 0 []  - 0 Specimen Collection (cultures, biopsies, blood, body fluids, etc.) []  - 0 Specimen(s) / Culture(s) sent or taken to Lab for analysis []  - 0 Patient Transfer (multiple staff / / Similar devices) []  - 0 Simple Staple / Suture removal (25 or less) []  - 0 Complex Staple / Suture removal (26 or more) []  - 0 Hypo / Hyperglycemic Management (close monitor of Blood Glucose) []  - 0 Ankle / Brachial Index (ABI) - do not check if billed separately []  - 0 Vital Signs Has the patient been seen at the hospital within the last three years: Yes Total Score: 65 Level Of Care: New/Established - Level 2 Electronic Signature(s) Signed: 10/18/2021 4:40:38 PM By: RN Entered By: on 10/18/2021 11:54:32 Marlowe, [place] (Nurse, adult) -------------------------------------------------------------------------------- Encounter Discharge Information Details Patient Name: Gilkes, Courtlynn C. Date of Service: 10/18/2021 10:45 AM Medical Record Number: Patient Account Number: Date of Birth/Sex: June 11, 1961 (60 y.o. F) Treating RN: 10/20/2021 Primary Care Faithann Natal: Rogers Blocker Other Clinician: Referring Shruti Arrey: Card, John Treating Ronie Fleeger/Extender: Rogers Blocker in Treatment: 0 Encounter Discharge Information Items Discharge Condition: Stable Ambulatory Status: Walker Discharge Destination: Home Transportation: Private Auto Accompanied By: self Schedule Follow-up Appointment: Yes Clinical Summary of Care: Electronic Signature(s) Signed: 10/18/2021 11:53:14 AM By: Salena Saner RN Entered By: [id number] on  10/18/2021 11:53:14 Goodlow, Ziyon C. ([id number]) -------------------------------------------------------------------------------- Wound Assessment Details Patient Name: Kulinski, Jessy C. Date of Service: 10/18/2021 10:45 AM Medical Record Number: [id number] Patient Account Number: [id number] Date of Birth/Sex: 03-04-1961 (60 y.o. F) Treating RN: Rowan Blase Primary Care Jessa Stinson: Card10/22/2022 Other Clinician: Referring Khary Schaben: Card, John Treating Mela Perham/Extender: Rogers Blocker in Treatment: 0 Wound Status Wound Number: 1 Primary Etiology: Dehisced Wound Wound Location: Distal, Midline Back Wound Status: Open Wounding Event: Surgical Injury Date Acquired: 04/11/2021 Weeks Of Treatment: 0 Clustered Wound: No Wound Measurements Length: (cm) 0.4 Width: (cm) 0.4 Depth: (cm) 4.6 Area: (cm) 0.126 Volume: (cm) 0.578 % Reduction in Area: 0% % Reduction in Volume: 0% Wound Description Classification: Full Thickness Without Exposed Support Structu Exudate Amount: Large Exudate Type: Purulent Exudate Color: yellow, brown, green res Treatment Notes Wound #1 (Back) Wound Laterality: Midline, Distal Cleanser Normal Saline Discharge Instruction: Wash your hands with soap and water. Remove old dressing, discard into plastic bag and place into trash. Cleanse the wound with Normal Saline prior to applying a clean dressing using gauze sponges, not tissues or cotton balls. Do not scrub or use excessive force. Pat dry using gauze sponges, not tissue or cotton balls. Peri-Wound Care Topical Primary Dressing Hydrofera Blue Classic Foam Rope Dressing, 9x6 (mm/in) Secondary Dressing Zetuvit Plus Silicone Non-bordered 5x5 (in/in) Secured With Compression Wrap Compression Stockings Add-Ons Electronic Signature(s) Signed: 10/18/2021 4:40:38 PM By: [id number] RN Entered By: 10/20/2021 on 10/18/2021 11:52:46

## 2021-10-19 NOTE — Progress Notes (Addendum)
[name], [name] ([id number]) Visit Report for 10/15/2021 Chief Complaint Document Details Patient Name: [name] [name]. Date of Service: 10/15/2021 8:45 AM Medical Record Number: [id number] Patient Account Number: [id number] Date of Birth/Sex: 1961-05-10 (60 y.o. F) Treating RN: Carlene Coria Primary Care Provider: Clayborn Heron Other Clinician: Referring Provider: Card, John Treating Provider/Extender: Skipper Cliche in Treatment: 0 Information Obtained from: Patient Chief Complaint Surgical Back Ulcer Electronic Signature(s) Signed: 10/15/2021 9:26:48 AM By: Worthy Keeler PA-[name] Entered By: Worthy Keeler on 10/15/2021 09:26:48 [name] [name] Loletha [name] ([id number]) -------------------------------------------------------------------------------- HPI Details Patient Name: [name] [name]. Date of Service: 10/15/2021 8:45 AM Medical Record Number: [id number] Patient Account Number: [id number] Date of Birth/Sex: [birth year] (60 y.o. F) Treating RN: Carlene Coria Primary Care Provider: Clayborn Heron Other Clinician: Referring Provider: Card, John Treating Provider/Extender: Skipper Cliche in Treatment: 0 History of Present Illness HPI Description: 10/15/2021 upon evaluation today patient presents for initial evaluation here in the clinic concerning a surgical ulceration/dehiscence in the lumbar spine region following surgery that she had over the past year. This was actually broken up into 3 separate surgical events. The initial surgical intervention actually was on November 05, 2021 almost a year ago. Subsequently the patient went back in February for a seroma of the area which unfortunately required her to have a repeat surgery to go in and clean this out. And then again this occurred in April where she went back in and again they felt like stitches were coming out and there was an additional seroma. She was placed in a wound VAC initially and then subsequently as it got smaller that  was discontinued. Again right now I will see anything that I think a wound VAC would help with. Nonetheless she definitely has a significant depth to the wound that is going require packing. I actually believe the Hydrofera Blue rope would probably do quite well with this the problem is as much as it is draining she probably needs this to be changed at least every day. She does not really have anyone that can help with that that is the complicating scenario here. With that being said the patient does have a history of multiple sclerosis, hypertension, and again this surgical wound dehiscence in regard to her lumbar spine region. She did have a repeat MRI which was actually completed 10/09/2021. This showed that there was no significant change in the subcutaneous fluid collection/track of the lower lumbar region. This is extending to the level of the fascia unfortunately. This seems to go all the way from the L2 level with a track extending all the way to the fascia at the L4-5 level. Again this is a significant wound and there is significant drainage but does not seem to communicate to the spinal region as far as spinal fluid or otherwise is concerned that is good news. Nonetheless she last saw Dr. Christella Noa who is her neurosurgeon on 10/01/2021 that was when he ordered this last MRI she supposed to see him next week as well. With that being said he did not feel like there was any significant issue there but was not sure why this was not healing that is when he ordered the MRI. They were wanting to make sure that this was packed appropriately by home health unfortunately the main issue currently is that home health is completely out of the picture as the patient has exhausted all the home health that that she gets for a year. She is now in a very difficult  predicament where she does not have anyone to help her change the dressing and to be honest that she is not able to do it herself with the location of  the wound being on the midline lumbar spine region. If she does not have anyone that can help it is probably can to be necessary for her to go to a facility for rehab and daily dressing changes as I feel like daily changes which is much drainage that she is having is going to be necessary. Electronic Signature(s) Signed: 10/15/2021 3:22:07 PM By: Worthy Keeler PA-[name] Entered By: Worthy Keeler on 10/15/2021 15:22:07 Ironside, [name] [name] ([id number]) -------------------------------------------------------------------------------- Physical Exam Details Patient Name: [name] [name]. Date of Service: 10/15/2021 8:45 AM Medical Record Number: [id number] Patient Account Number: [id number] Date of Birth/Sex: [birth year] (60 y.o. F) Treating RN: Carlene Coria Primary Care Provider: Clayborn Heron Other Clinician: Referring Provider: Card, John Treating Provider/Extender: Skipper Cliche in Treatment: 0 Constitutional patient is hypertensive.. pulse regular and within target range for patient.Marland Kitchen respirations regular, non-labored and within target range for patient.Marland Kitchen temperature within target range for patient.. Well-nourished and well-hydrated in no acute distress. Eyes conjunctiva clear no eyelid edema noted. pupils equal round and reactive to light and accommodation. Ears, Nose, Mouth, and Throat no gross abnormality of ear auricles or external auditory canals. normal hearing noted during conversation. mucus membranes moist. Respiratory normal breathing without difficulty. clear to auscultation bilaterally. Musculoskeletal normal gait and posture. no significant deformity or arthritic changes, no loss or range of motion, no clubbing. Psychiatric this patient is able to make decisions and demonstrates good insight into disease process. Alert and Oriented x 3. pleasant and cooperative. Notes Upon inspection patient's wound bed actually showed signs of really having a small opening there does not  appear to be any signs of infection systemically though locally there was purulent drainage and a tremendous amount of it. We did culture from deep in the wound as best we could but nonetheless based on what we are seeing I think there is still a significant issue going on here but I am not certain can be addressed with occasional dressing changes here in the clinic. I discussed with the patient that she probably needs to have a more dedicated person to help her with treatments and changing the dressings daily short of that I think she is probably can need a skilled nursing facility for rehab to try to get this moving in the right direction. There is really no substitute for being able to change his dressing daily at this time with as much drainage as she has. Electronic Signature(s) Signed: 10/15/2021 3:25:56 PM By: Worthy Keeler PA-[name] Entered By: Worthy Keeler on 10/15/2021 15:25:56 [name] [name] Farr ([id number]) -------------------------------------------------------------------------------- Physician Orders Details Patient Name: [name] [name]. Date of Service: 10/15/2021 8:45 AM Medical Record Number: [id number] Patient Account Number: [id number] Date of Birth/Sex: 1961/02/18 (60 y.o. 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 Wall for wound care. May utilize formulary equivalent dressing for wound treatment orders unless otherwise specified. Home Health  Nurse may visit PRN  to address patientos wound care needs. Alvis Lemmings fax [phone] Bathing/ Shower/ Hygiene o May shower; gently cleanse wound with antibacterial soap, rinse and pat dry prior to dressing wounds Edema Control - Lymphedema / Segmental Compressive Device / Other o Elevate, Exercise Daily and Avoid Standing for Long Periods of Time. o Elevate legs to the level of the heart and pump ankles as often as possible o Elevate leg(s) parallel to the floor when sitting. Wound Treatment Wound #1 - Back Wound Laterality: Midline, Distal Cleanser: Normal Saline 1 x Per Day/30 Days Discharge Instructions: Wash your hands with soap and water. Remove old dressing, discard into plastic bag and place into trash. Cleanse the wound with Normal Saline prior to applying a clean dressing using gauze sponges, not tissues or cotton balls. Do not scrub or use excessive force. Pat dry using gauze sponges, not tissue or cotton balls. Primary Dressing: Hydrofera Blue Classic Foam Rope Dressing, 9x6 (mm/in) 1 x Per Day/30 Days Secondary Dressing: Zetuvit Plus Silicone Non-bordered 5x5 (in/in) 1 x Per Day/30 Days Laboratory o Bacteria identified in Wound by Culture (MICRO) - non healing wound - (ICD10 T81.31XA - Disruption of external operation (surgical) wound, not elsewhere classified, initial encounter) oooo LOINC Code: 9562-1 oooo Convenience Name: Wound culture routine Patient Medications Allergies: No Known Allergies Notifications Medication Indication Start End Bactrim DS 10/19/2021 DOSE 1 - oral 800 mg-160 mg tablet - 1 tablet oral taken 2 times per day for 30 days Electronic Signature(s) LASONIA, CASINO ([id number]) Signed: 11/01/2021 5:07:02 PM By: Carlene Coria RN Signed: 12/21/2021 12:11:21 PM By: Worthy Keeler PA-[name] Previous Signature: 10/19/2021 2:39:31 PM Version By: Worthy Keeler PA-[name] Previous Signature: 10/16/2021 4:43:23 PM Version By: Worthy Keeler PA-[name] Previous  Signature: 10/15/2021 5:23:05 PM Version By: Worthy Keeler PA-[name] Entered By: Carlene Coria on 10/31/2021 15:02:00 [name] [name]. ([id number]) -------------------------------------------------------------------------------- Problem List Details Patient Name: Guyton, [name] [name]. Date of Service: 10/15/2021 8:45 AM Medical Record Number: [id number] Patient Account Number: [id number] Date of Birth/Sex: 01-30-1961 (60 y.o. 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 Date of Service: 10/15/2021 8:45 AM Medical Record Number: [id number] Patient Account Number: [id number] Date of Birth/Sex: 05-12-1961 (60 y.o. F) Treating RN: Carlene Coria Primary Care Provider: Clayborn Heron Other Clinician: Referring Provider: Card, John Treating Provider/Extender: Skipper Cliche in Treatment: 0 Subjective Chief Complaint Information obtained from Patient Surgical Back Ulcer History of Present Illness (HPI) 10/15/2021 upon evaluation today patient presents for initial evaluation here in the clinic concerning a surgical ulceration/dehiscence in the lumbar spine region  following surgery that she had over the past year. This was actually broken up into 3 separate surgical events. The initial surgical intervention actually was on November 05, 2021 almost a year ago. Subsequently the patient went back in February for a seroma of the area which unfortunately required her to have a repeat surgery to go in  and clean this out. And then again this occurred in April where she went back in and again they felt like stitches were coming out and there was an additional seroma. She was placed in a wound VAC initially and then subsequently as it got smaller that was discontinued. Again right now I will see anything that I think a wound VAC would help with. Nonetheless she definitely has a significant depth to the wound that is going require packing. I actually believe the Hydrofera Blue rope would probably do quite well with this the problem is as much as it is draining she probably needs this to be changed at least every day. She does not really have anyone that can help with that that is the complicating scenario here. With that being said the patient does have a history of multiple sclerosis, hypertension, and again this surgical wound dehiscence in regard to her lumbar spine region. She did have a repeat MRI which was actually completed 10/09/2021. This showed that there was no significant change in the subcutaneous fluid collection/track of the lower lumbar region. This is extending to the level of the fascia unfortunately. This seems to go all the way from the L2 level with a track extending all the way to the fascia at the L4-5 level. Again this is a significant wound and there is significant drainage but does not seem to communicate to the spinal region as far as spinal fluid or otherwise is concerned that is good news. Nonetheless she last saw Dr. Christella Noa who is her neurosurgeon on 10/01/2021 that was when he ordered this last MRI she supposed to see him next week as well.  With that being said he did not feel like there was any significant issue there but was not sure why this was not healing that is when he ordered the MRI. They were wanting to make sure that this was packed appropriately by home health unfortunately the main issue currently is that home health is completely out of the picture as the patient has exhausted all the home health that that she gets for a year. She is now in a very difficult predicament where she does not have anyone to help her change the dressing and to be honest that she is not able to do it herself with the location of the wound being on the midline lumbar spine region. If she does not have anyone that can help it is probably can to be necessary for her to go to a facility for rehab and daily dressing changes as I feel like daily changes which is much drainage that she is having is going to be necessary. Patient History Information obtained from Patient. Allergies No Known Allergies Social History Never smoker, Marital Status - Single, Alcohol Use - Rarely, Drug Use - No History, Caffeine Use - Never. Medical History Cardiovascular Patient has history of Hypertension Review of Systems (ROS) Eyes Complains or has symptoms of Glasses / Contacts. Respiratory Denies complaints or symptoms of Chronic or frequent coughs, Shortness of Breath. Integumentary (Skin) Complains or has symptoms of Wounds. Objective Constitutional patient is hypertensive.. pulse regular and within target range for patient.Marland Kitchen respirations regular, non-labored and within target range for patient.Marland Kitchen temperature within target range for patient.. Well-nourished and well-hydrated in no acute distress. [name] [name]. ([id number]) Vitals Time Taken: 8:56 AM, Height: 63 in, Source: Stated, Weight: 275 lbs, Source: Measured, BMI: 48.7, Temperature: 98 F, Pulse: 81 bpm, Respiratory Rate: 18 breaths/min, Blood Pressure: 159/93 mmHg. Eyes  conjunctiva clear no  eyelid edema noted. pupils equal round and reactive to light and accommodation. Ears, Nose, Mouth, and Throat no gross abnormality of ear auricles or external auditory canals. normal hearing noted during conversation. mucus membranes moist. Respiratory normal breathing without difficulty. clear to auscultation bilaterally. Musculoskeletal normal gait and posture. no significant deformity or arthritic changes, no loss or range of motion, no clubbing. Psychiatric this patient is able to make decisions and demonstrates good insight into disease process. Alert and Oriented x 3. pleasant and cooperative. General Notes: Upon inspection patient's wound bed actually showed signs of really having a small opening there does not appear to be any signs of infection systemically though locally there was purulent drainage and a tremendous amount of it. We did culture from deep in the wound as best we could but nonetheless based on what we are seeing I think there is still a significant issue going on here but I am not certain can be addressed with occasional dressing changes here in the clinic. I discussed with the patient that she probably needs to have a more dedicated person to help her with treatments and changing the dressings daily short of that I think she is probably can need a skilled nursing facility for rehab to try to get this moving in the right direction. There is really no substitute for being able to change his dressing daily at this time with as much drainage as she has. Integumentary (Hair, Skin) Wound #1 status is Open. Original cause of wound was Surgical Injury. The date acquired was: 04/11/2021. The wound is located on the [place] Back. The wound measures 0.4cm length x 0.4cm width x 4.6cm depth; 0.126cm^2 area and 0.578cm^3 volume. There is Fat Layer (Subcutaneous Tissue) exposed. There is no tunneling or undermining noted. There is a large amount of purulent drainage noted. There  is medium (34-66%) red granulation within the wound bed. There is a medium (34-66%) amount of necrotic tissue within the wound bed including Adherent Slough. 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 Home Health: [facility] for [facility] care. May utilize formulary equivalent dressing for wound treatment orders unless otherwise specified. Home Health Nurse may visit PRN to address patient s wound care needs. Alvis Lemmings fax [phone] Bathing/ Shower/ Hygiene: May shower; gently cleanse wound with antibacterial soap, rinse and pat dry prior to dressing wounds Edema Control - Lymphedema / Segmental Compressive Device / Other: Elevate, Exercise Daily and Avoid Standing for Long Periods of Time. Elevate legs to the level of the heart and pump ankles as often as possible Elevate leg(s) parallel to the floor when sitting. The following medication(s) was prescribed: Bactrim DS oral 800 mg-160 mg tablet 1 1 tablet oral taken 2 times per day for 30 days starting 10/19/2021 WOUND #1: - Back Wound Laterality: Midline, Distal Cleanser: Normal Saline 1 x Per Day/30 Days Discharge Instructions: Wash your hands with soap and water. Remove old dressing, discard into plastic bag and place into trash. Cleanse the wound with Normal Saline prior to applying a clean dressing using gauze sponges, not tissues or cotton balls. Do not scrub or use excessive force. Pat dry using gauze sponges, not tissue or cotton balls. Primary Dressing: Hydrofera Blue Classic Foam Rope Dressing, 9x6 (mm/in) 1 x Per Day/30 Days Secondary Dressing: Zetuvit Plus Silicone  Non-bordered 5x5 (in/in) 1 x Per Day/30 Days [name] [name]. ([id number]) 1. Based on what I am  seeing currently I am going to go ahead and recommend that we have the patient initiate treatment with Hydrofera Blue rope to pack into the wound area. This should be cut in half so that it will fit without being too tight and the patient is in agreement with that plan. 2. I did actually contact Dr. Hewitt Shorts office as well. The issue here is that the patient does not have anyone that can help her change this we can order her supplies but she has no one to help with changing the dressings and utilizing the supplies home health unfortunately discontinued Friday and cannot be renewed as she has met the yearly allotment for what can be done in that regard. If she had a friend, a relative, or someone else that can help her with dressing changes we could definitely teach them how and what to do. Is just a matter honestly of getting someone that is able to help her daily at least in the beginning. If things improve and there is less drainage we can always cut back on that. 3. With regard to the drainage I did take a deep wound culture to try to see if there is anything from an infection standpoint we need to be addressing here. I think that still can be something to consider in this regard. We will see patient back for reevaluation in 1 week here in the clinic. If anything worsens or changes patient will contact our office for additional recommendations. At the time of dictation I am still waiting on hearing back from Dr. Lacy Duverney office after leaving a message with his nurse she supposed to talk to him and get back with me. Nonetheless I have not heard anything back as of yet. Depending on what they recommend will depend on the next steps. I think the patient's can either need to go into a skilled nursing facility or else find someone that can help her with daily dressing changes at home I really do not see another option for her. Addendum: I did review the patient's culture results today. That is on  10/19/2021. Nonetheless I do believe that the patient is positive for both Proteus as well as MRSA. I think that she would benefit from an antibiotic to try to help in this regard. I do believe that based on the resistance pattern to her best option is probably going to be Bactrim which to be honest is the only medication that does treat both infections simultaneously. I Georgina Peer subsequently send this into the pharmacy for her and I Georgina Peer do it for a month just to be on the safe side considering the significance of this wound currently. Again this was a deep wound culture that was obtained when she was here in the office. Electronic Signature(s) Signed: 10/19/2021 2:39:41 PM By: Worthy Keeler PA-[name] Previous Signature: 10/19/2021 2:29:06 PM Version By: Worthy Keeler PA-[name] Previous Signature: 10/15/2021 3:33:08 PM Version By: Worthy Keeler PA-[name] Entered By: Worthy Keeler on 10/19/2021 14:39:41 [name] [name] Loletha [name] ([id number]) -------------------------------------------------------------------------------- ROS/PFSH Details Patient Name: Pisarski, [name] [name]. Date of Service: 10/15/2021 8:45 AM Medical Record Number: [id number] Patient Account Number: [id number] Date of Birth/Sex: 08-30-1961 (60 y.o. F) Treating RN: Carlene Coria Primary Care Provider: Clayborn Heron Other Clinician: Referring Provider: Card, John Treating Provider/Extender: Skipper Cliche in Treatment: 0 Information Obtained From Patient Eyes Complaints and Symptoms: Positive for:  Glasses / Contacts Respiratory Complaints and Symptoms: Negative for: Chronic or frequent coughs; Shortness of Breath Integumentary (Skin) Complaints and Symptoms: Positive for: Wounds Cardiovascular Medical History: Positive for: Hypertension Immunizations Pneumococcal Vaccine: Received Pneumococcal Vaccination: Yes Received Pneumococcal Vaccination On or After 60th Birthday: Yes Implantable Devices None Family and Social History Never  smoker; Marital Status - Single; Alcohol Use: Rarely; Drug Use: No History; Caffeine Use: Never; Financial Concerns: No; Food, Clothing or Shelter Needs: No; Support System Lacking: No; Transportation Concerns: No Electronic Signature(s) Signed: 10/15/2021 5:23:05 PM By: Worthy Keeler PA-[name] Signed: 10/19/2021 4:19:44 PM By: Carlene Coria RN Entered By: Carlene Coria on 10/15/2021 [place], Sacha [name]. ([id number]) -------------------------------------------------------------------------------- SuperBill Details Patient Name: Harnois, Beautiful [name]. Date of Service: 10/15/2021 Medical Record Number: [id number] Patient Account Number: [id number] Date of Birth/Sex: 18-Mar-1961 (60 y.o. 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 G35 Multiple sclerosis I10 Essential (primary) hypertension Facility Procedures CPT4 Code: [id number] Description: 99214 - WOUND CARE VISIT-LEV 4 EST PT Modifier: Quantity: 1 Physician Procedures CPT4 Code: 1517616 Description: 07371 - WC PHYS LEVEL 4 - NEW PT Modifier: Quantity: 1 CPT4 Code: Description: ICD-10 Diagnosis Description T81.31XA Disruption of external operation (surgical) wound, not elsewhere classifi 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

## 2021-10-19 NOTE — Progress Notes (Signed)
MURIAH, HARSHA ([id number]) Visit Report for 10/15/2021 Abuse/Suicide Risk Screen Details Patient Name: [name], [name] Date of Service: 10/15/2021 8:45 AM Medical Record Number: [id number] Patient Account Number: [id number] Date of Birth/Sex: 25-Apr-1961 (60 y.o. F) Treating RN: Yevonne Pax Primary Care Graden Hoshino: [name] Other Clinician: Referring Heriberto Stmartin: Card, John Treating Captola Teschner/Extender: Rowan Blase in Treatment: 0 Abuse/Suicide Risk Screen Items Answer ABUSE RISK SCREEN: Has anyone close to you tried to hurt or harm you recentlyo No Do you feel uncomfortable with anyone in your familyo No Has anyone forced you do things that you didnot want to doo No Electronic Signature(s) Signed: 10/19/2021 4:19:44 PM By: Yevonne Pax RN Entered By: Yevonne Pax on 10/15/2021 09:01:16 [name], [name] ([id number]) -------------------------------------------------------------------------------- Activities of Daily Living Details Patient Name: [name], [name] Date of Service: 10/15/2021 8:45 AM Medical Record Number: [id number] Patient Account Number: [id number] Date of Birth/Sex: Feb 02, 1961 (60 y.o. F) Treating RN: Yevonne Pax Primary Care Nelda Luckey: [name] Other Clinician: Referring Inita Uram: Card, John Treating Deveion Denz/Extender: Rowan Blase in Treatment: 0 Activities of Daily Living Items Answer Activities of Daily Living (Please select one for each item) Drive Automobile Completely Able Take Medications Completely Able Use Telephone Completely Able Care for Appearance Completely Able Use Toilet Completely Able Bath / Shower Completely Able Dress Self Completely Able Feed Self Completely Able Walk Completely Able Get In / Out Bed Completely Able Housework Completely Able Prepare Meals Completely Able Handle Money Completely Able Shop for Self Completely Able Electronic Signature(s) Signed: 10/19/2021 4:19:44 PM By: Yevonne Pax RN Entered  By: Yevonne Pax on 10/15/2021 09:01:49 [name], [name] ([id number]) -------------------------------------------------------------------------------- Education Screening Details Patient Name: [name], [name] Date of Service: 10/15/2021 8:45 AM Medical Record Number: [id number] Patient Account Number: [id number] Date of Birth/Sex: November 22, 1961 (60 y.o. F) Treating RN: Yevonne Pax Primary Care Marine Lezotte: [name] Other Clinician: Referring Pratham Cassatt: Card, John Treating Sholonda Jobst/Extender: Rowan Blase in Treatment: 0 Primary Learner Assessed: Patient Learning Preferences/Education Level/Primary Language Learning Preference: Explanation Highest Education Level: College or Above Preferred Language: English Cognitive Barrier Language Barrier: No Translator Needed: No Memory Deficit: No Emotional Barrier: No Cultural/Religious Beliefs Affecting Medical Care: No Physical Barrier Impaired Vision: Yes Glasses Impaired Hearing: No Decreased Hand dexterity: No Knowledge/Comprehension Knowledge Level: Medium Comprehension Level: High Ability to understand written instructions: High Ability to understand verbal instructions: High Motivation Anxiety Level: Anxious Cooperation: Cooperative Education Importance: Acknowledges Need Interest in Health Problems: Asks Questions Perception: Coherent Willingness to Engage in Self-Management High Activities: Readiness to Engage in Self-Management High Activities: Electronic Signature(s) Signed: 10/19/2021 4:19:44 PM By: Yevonne Pax RN Entered By: Yevonne Pax on 10/15/2021 09:02:33 [name], [name] ([id number]) -------------------------------------------------------------------------------- Fall Risk Assessment Details Patient Name: [name], [name] Date of Service: 10/15/2021 8:45 AM Medical Record Number: [id number] Patient Account Number: [id number] Date of Birth/Sex: 12-07-1961 (60 y.o. F) Treating RN: Yevonne Pax Primary Care Bryley Kovacevic: [name] Other Clinician: Referring Rahmon Heigl: Card, John Treating Aquita Simmering/Extender: Rowan Blase in Treatment: 0 Fall Risk Assessment Items Have you had 2 or more falls in the last 12 monthso 0 No Have you had any fall that resulted in injury in the last 12 monthso 0 No FALLS RISK SCREEN History of falling - immediate or within 3 months 0 No Secondary diagnosis (Do you have 2 or more medical diagnoseso) 0 No Ambulatory aid None/bed rest/wheelchair/nurse 0 No Crutches/cane/walker 0 No Furniture 0 No Intravenous therapy Access/Saline/Heparin Lock 0 No Gait/Transferring Normal/ bed rest/ wheelchair 0 No Weak (short steps  with or without shuffle, stooped but able to lift head while walking, may 0 No seek support from furniture) Impaired (short steps with shuffle, may have difficulty arising from chair, head down, impaired 0 No balance) Mental Status Oriented to own ability 0 No Electronic Signature(s) Signed: 10/19/2021 4:19:44 PM By: Yevonne Pax RN Entered By: Yevonne Pax on 10/15/2021 09:02:42 [name], [name] ([id number]) -------------------------------------------------------------------------------- Foot Assessment Details Patient Name: [name], [name] Date of Service: 10/15/2021 8:45 AM Medical Record Number: [id number] Patient Account Number: [id number] Date of Birth/Sex: 1961-10-09 (60 y.o. F) Treating RN: Yevonne Pax Primary Care Ermelinda Eckert: Card, Jonny Ruiz Other Clinician: Referring Shawanda Sievert: Card, John Treating Nihal Doan/Extender: Rowan Blase in Treatment: 0 Foot Assessment Items Site Locations + = Sensation present, - = Sensation absent, [name] = Callus, U = Ulcer R = Redness, W = Warmth, M = Maceration, PU = Pre-ulcerative lesion F = Fissure, S = Swelling, D = Dryness Assessment Right: Left: Other Deformity: No No Prior Foot Ulcer: No No Prior Amputation: No No Charcot Joint: No No Ambulatory Status: Ambulatory Without  Help Gait: Steady Electronic Signature(s) Signed: 10/19/2021 4:19:44 PM By: Yevonne Pax RN Entered By: Yevonne Pax on 10/15/2021 09:03:32 Bourget, [name] ([id number]) -------------------------------------------------------------------------------- Nutrition Risk Screening Details Patient Name: [name], [name] Date of Service: 10/15/2021 8:45 AM Medical Record Number: [id number] Patient Account Number: [id number] Date of Birth/Sex: 09/05/1961 (60 y.o. F) Treating RN: Yevonne Pax Primary Care Talha Iser: Card, Jonny Ruiz Other Clinician: Referring Juno Bozard: Card, John Treating Deosha Werden/Extender: Rowan Blase in Treatment: 0 Height (in): 63 Weight (lbs): 275 Body Mass Index (BMI): 48.7 Nutrition Risk Screening Items Score Screening NUTRITION RISK SCREEN: I have an illness or condition that made me change the kind and/or amount of food I eat 0 No I eat fewer than two meals per day 0 No I eat few fruits and vegetables, or milk products 0 No I have three or more drinks of beer, liquor or wine almost every day 0 No I have tooth or mouth problems that make it hard for me to eat 0 No I don't always have enough money to buy the food I need 0 No I eat alone most of the time 0 No I take three or more different prescribed or over-the-counter drugs a day 1 Yes Without wanting to, I have lost or gained 10 pounds in the last six months 0 No I am not always physically able to shop, cook and/or feed myself 0 No Nutrition Protocols Good Risk Protocol 0 No interventions needed Moderate Risk Protocol High Risk Proctocol Risk Level: Good Risk Score: 1 Electronic Signature(s) Signed: 10/19/2021 4:19:44 PM By: Yevonne Pax RN Entered By: Yevonne Pax on 10/15/2021 09:03:12

## 2021-10-19 NOTE — Progress Notes (Signed)
SHAMERA, YARBERRY ([id number]) Visit Report for 10/15/2021 Allergy List Details Patient Name: [name] Date of Service: 10/15/2021 8:45 AM Medical Record Number: [id number] Patient Account Number: [id number] Date of Birth/Sex: [birth year] (60 y.o. F) Treating RN: [name] Primary Care [name]: [name] Other Clinician: Referring [name]: Card, [name]/Extender: [name] in Treatment: 0 Allergies Active Allergies No Known Allergies Allergy Notes Electronic Signature(s) Signed: 10/19/2021 4:19:44 PM By: [name] Pax RN Entered By: [name] on 10/15/2021 08:57:05 [name] ([id number]) -------------------------------------------------------------------------------- Arrival Information Details Patient Name: [name] Date of Service: 10/15/2021 8:45 AM Medical Record Number: [id number] Patient Account Number: [id number] Date of Birth/Sex: 1960-12-31 (60 y.o. F) Treating RN: [name] Primary Care [name]: [name] Other Clinician: Referring [name]: Card, [name] Treating [name]/Extender: [name] in Treatment: 0 Visit Information Patient Arrived: Walker Arrival Time: 08:53 Accompanied By: self Transfer Assistance: None Patient Identification Verified: Yes Secondary Verification Process Completed: Yes Patient Requires Transmission-Based Precautions: No Patient Has Alerts: No Electronic Signature(s) Signed: 10/19/2021 4:19:44 PM By: [name] Pax RN Entered By: [name] on 10/15/2021 08:53:59 [name] ([id number]) -------------------------------------------------------------------------------- Clinic Level of Care Assessment Details Patient Name: [name] Date of Service: 10/15/2021 8:45 AM Medical Record Number: [id number] Patient Account Number: [id number] Date of Birth/Sex: 07-Jun-1961 (60 y.o. F) Treating RN: [name] Primary Care Jannifer Fischler: Card, [name] Other  Clinician: Referring [name]: Card, [name] Treating [name]/Extender: [name] in Treatment: 0 Clinic Level of Care Assessment Items TOOL 2 Quantity Score X - Use when only an EandM is performed on the INITIAL visit 1 0 ASSESSMENTS - Nursing Assessment / Reassessment X - General Physical Exam (combine w/ comprehensive assessment (listed just below) when performed on new 1 20 pt. evals) X- 1 25 Comprehensive Assessment (HX, ROS, Risk Assessments, Wounds Hx, etc.) ASSESSMENTS - Wound and Skin Assessment / Reassessment X - Simple Wound Assessment / Reassessment - one wound 1 5 []  - 0 Complex Wound Assessment / Reassessment - multiple wounds []  - 0 Dermatologic / Skin Assessment (not related to wound area) ASSESSMENTS - Ostomy and/or Continence Assessment and Care []  - Incontinence Assessment and Management 0 []  - 0 Ostomy Care Assessment and Management (repouching, etc.) PROCESS - Coordination of Care X - Simple Patient / Family Education for ongoing care 1 15 []  - 0 Complex (extensive) Patient / Family Education for ongoing care X- 1 10 Staff obtains , Records, Test Results / Process Orders []  - 0 Staff telephones HHA, Nursing Homes / Clarify orders / etc []  - 0 Routine Transfer to another Facility (non-emergent condition) []  - 0 Routine Hospital Admission (non-emergent condition) X- 1 15 New Admissions / / Ordering NPWT, Apligraf, etc. []  - 0 Emergency Hospital Admission (emergent condition) X- 1 10 Simple Discharge Coordination []  - 0 Complex (extensive) Discharge Coordination PROCESS - Special Needs []  - Pediatric / Minor Patient Management 0 []  - 0 Isolation Patient Management []  - 0 Hearing / Language / Visual special needs []  - 0 Assessment of Community assistance (transportation, D/[name] planning, etc.) []  - 0 Additional assistance / Altered mentation []  - 0 Support Surface(s) Assessment (bed, cushion, seat,  etc.) INTERVENTIONS - Wound Cleansing / Measurement X - Wound Imaging (photographs - any number of wounds) 1 5 []  - 0 Wound Tracing (instead of photographs) X- 1 5 Simple Wound Measurement - one wound []  - 0 Complex Wound Measurement - multiple wounds [name] ( ) X- 1 5 Simple Wound  Cleansing - one wound []  - 0 Complex Wound Cleansing - multiple wounds INTERVENTIONS - Wound Dressings []  - Small Wound Dressing one or multiple wounds 0 X- 1 15 Medium Wound Dressing one or multiple wounds []  - 0 Large Wound Dressing one or multiple wounds []  - 0 Application of Medications - injection INTERVENTIONS - Miscellaneous []  - External ear exam 0 []  - 0 Specimen Collection (cultures, biopsies, blood, body fluids, etc.) []  - 0 Specimen(s) / Culture(s) sent or taken to Lab for analysis []  - 0 Patient Transfer (multiple staff / / Similar devices) []  - 0 Simple Staple / Suture removal (25 or less) []  - 0 Complex Staple / Suture removal (26 or more) []  - 0 Hypo / Hyperglycemic Management (close monitor of Blood Glucose) []  - 0 Ankle / Brachial Index (ABI) - do not check if billed separately Has the patient been seen at the hospital within the last three years: Yes Total Score: 130 Level Of Care: New/Established - Level 4 Electronic Signature(s) Signed: 10/19/2021 4:19:44 PM By: RN Entered By: on 10/15/2021 09:57:04 [name] ( ) -------------------------------------------------------------------------------- Encounter Discharge Information Details Patient Name: [name] Date of Service: 10/15/2021 8:45 AM Medical Record Number: Nurse, adult Patient Account Number: Date of Birth/Sex: December 29, 1961 (59 y.o. F) Treating RN: Primary Care Maxim Bedel: 10/21/2021 Other Clinician: Referring [name]: Card, [name]/Extender: [name] in Treatment: 0 Encounter  Discharge Information Items Discharge Condition: Stable Ambulatory Status: Ambulatory Discharge Destination: Home Transportation: Private Auto Accompanied By: self Schedule Follow-up Appointment: Yes Clinical Summary of Care: Patient Declined Electronic Signature(s) Signed: 10/19/2021 4:19:44 PM By: 10/17/2021 RN Entered By: Marland Kitchen on 10/15/2021 10:02:03 [name] (10/17/2021) -------------------------------------------------------------------------------- Lower Extremity Assessment Details Patient Name: Tiedeman, Trichelle [name]. Date of Service: 10/15/2021 8:45 AM Medical Record Number: [id number] Patient Account Number: 09/11/1961 Date of Birth/Sex: December 19, 1961 (61 y.o. F) Treating RN: [name] Primary Care Daishawn Lauf: [name] Other Clinician: Referring Ercelle Winkles: Card, [name] Treating Shauntay Brunelli/Extender: 10/21/2021 [name] in Treatment: 0 Electronic Signature(s) Signed: 10/19/2021 4:19:44 PM By: [name] Pax RN Entered By: 10/17/2021 on 10/15/2021 09:03:46 Dirico, Charmagne C10/19/2022 ([id number]) -------------------------------------------------------------------------------- Multi Wound Chart Details Patient Name: Pennella, Rahmah [name]. Date of Service: 10/15/2021 8:45 AM Medical Record Number: 09/11/1961 Patient Account Number: [id number] Date of Birth/Sex: [birth year] (60 y.o. F) Treating RN: [name] Derry Primary Care Bary Limbach: 10/21/2021 Other Clinician: Referring Brittnei Jagiello: Card, [name] Treating Laker Thompson/Extender: [name] in Treatment: 0 Vital Signs Height(in): 63 Pulse(bpm): 81 Weight(lbs): 275 Blood Pressure(mmHg): 159/93 Body Mass Index(BMI): 49 Temperature(F): 98 Respiratory Rate(breaths/min): 18 Photos: [N/A:N/A] Wound Location: Distal, Midline Back N/A N/A Wounding Event: Surgical Injury N/A N/A Primary Etiology: Dehisced Wound N/A N/A Comorbid History: Hypertension N/A N/A Date Acquired: 04/11/2021 N/A N/A [name] of Treatment: 0 N/A N/A Wound  Status: Open N/A N/A Measurements L x W x D (cm) 0.4x0.4x4.6 N/A N/A Area (cm) : 0.126 N/A N/A Volume (cm) : 0.578 N/A N/A Classification: Full Thickness Without Exposed N/A N/A Support Structures Exudate Amount: Large N/A N/A Exudate Type: Purulent N/A N/A Exudate Color: yellow, brown, green N/A N/A Granulation Amount: Medium (34-66%) N/A N/A Granulation Quality: Red N/A N/A Necrotic Amount: Medium (34-66%) N/A N/A Exposed Structures: Fat Layer (Subcutaneous Tissue): N/A N/A Yes Fascia: No Tendon: No Muscle: No Joint: No Bone: No Epithelialization: None N/A N/A Treatment Notes Electronic Signature(s) Signed: 10/19/2021 4:19:44 PM By: Marland Kitchen RN Entered By: [id number] on 10/15/2021 09:41:11 [name],  [name] PEOPLES ([id number]) -------------------------------------------------------------------------------- Multi-Disciplinary Care Plan Details Patient Name: [name] Date of Service: 10/15/2021 8:45 AM Medical Record Number: [id number] Patient Account Number: [id number] Date of Birth/Sex: Apr 14, 1961 (60 y.o. F) Treating RN: [name] Primary Care Chayce Robbins: [name] Other Clinician: Referring Tianah Lonardo: Card, [name] Treating Sofija Antwi/Extender: [name] in Treatment: 0 Active Inactive Wound/Skin Impairment Nursing Diagnoses: Knowledge deficit related to ulceration/compromised skin integrity Goals: Patient/caregiver will verbalize understanding of skin care regimen Date Initiated: 10/15/2021 Target Resolution Date: 11/15/2021 Goal Status: Active Ulcer/skin breakdown will have a volume reduction of 30% by week 4 Date Initiated: 10/15/2021 Target Resolution Date: 12/15/2021 Goal Status: Active Ulcer/skin breakdown will have a volume reduction of 50% by week 8 Date Initiated: 10/15/2021 Target Resolution Date: 01/15/2022 Goal Status: Active Ulcer/skin breakdown will have a volume reduction of 80% by week 12 Date Initiated: 10/15/2021 Target  Resolution Date: 02/15/2022 Goal Status: Active Ulcer/skin breakdown will heal within 14 [name] Date Initiated: 10/15/2021 Target Resolution Date: 03/15/2022 Goal Status: Active Interventions: Assess patient/caregiver ability to obtain necessary supplies Assess patient/caregiver ability to perform ulcer/skin care regimen upon admission and as needed Assess ulceration(s) every visit Notes: Electronic Signature(s) Signed: 10/19/2021 4:19:44 PM By: [name] Pax RN Entered By: [name] on 10/15/2021 09:40:26 [name] ([id number]) -------------------------------------------------------------------------------- Pain Assessment Details Patient Name: Kunzler, Zanae [name]. Date of Service: 10/15/2021 8:45 AM Medical Record Number: [id number] Patient Account Number: [id number] Date of Birth/Sex: 1961-02-25 (60 y.o. F) Treating RN: [name] Primary Care Alila Sotero: [name] Other Clinician: Referring Deston Bilyeu: Card, [name] Treating Kolsen Choe/Extender: [name] in Treatment: 0 Active Problems Location of Pain Severity and Description of Pain Patient Has Paino Yes Site Locations With Dressing Change: Yes Duration of the Pain. Constant / Intermittento Constant Rate the pain. Current Pain Level: 3 Worst Pain Level: 5 Least Pain Level: 2 Tolerable Pain Level: 5 Character of Pain Describe the Pain: Aching, Throbbing Pain Management and Medication Current Pain Management: Medication: Yes Cold Application: No Rest: Yes Massage: No Activity: No T.E.N.S.: No Heat Application: No Leg drop or elevation: No Is the Current Pain Management Adequate: Inadequate How does your wound impact your activities of daily livingo Sleep: No Bathing: No Appetite: No Relationship With Others: No Bladder Continence: No Emotions: No Bowel Continence: No Work: No Toileting: No Drive: No Dressing: No Hobbies: No Electronic Signature(s) Signed: 10/19/2021 4:19:44 PM By: [name] Pax RN Entered By: [name] on 10/15/2021 08:56:15 [name], [name] Salena Saner ([id number]) -------------------------------------------------------------------------------- Patient/Caregiver Education Details Patient Name: Bogie, [name] Date of Service: 10/15/2021 8:45 AM Medical Record Number: [id number] Patient Account Number: [id number] Date of Birth/Gender: 1961-02-08 (60 y.o. F) Treating RN: [name] Primary Care Physician: [name] Other Clinician: Referring Physician: Card, [name] Treating Physician/Extender: [name] in Treatment: 0 Education Assessment Education Provided To: Patient Education Topics Provided Wound/Skin Impairment: Methods: Explain/Verbal Responses: State content correctly Electronic Signature(s) Signed: 10/19/2021 4:19:44 PM By: [name] Pax RN Entered By: [name] on 10/15/2021 09:58:06 [name] ([id number]) -------------------------------------------------------------------------------- Wound Assessment Details Patient Name: Raulston, Jaisa [name]. Date of Service: 10/15/2021 8:45 AM Medical Record Number: [id number] Patient Account Number: [id number] Date of Birth/Sex: 06/09/1961 (60 y.o. F) Treating RN: [name] Primary Care Efstathios Sawin: [name] Other Clinician: Referring Shelena Castelluccio: Card, [name] Treating Consandra Laske/Extender: [name] in Treatment: 0 Wound Status Wound Number: 1 Primary Etiology: Dehisced Wound Wound Location: Distal, Midline Back Wound Status: Open Wounding Event: Surgical Injury Comorbid History: Hypertension Date Acquired: 04/11/2021 [name] Of Treatment: 0 Clustered Wound:  No Photos Wound Measurements Length: (cm) 0.4 Width: (cm) 0.4 Depth: (cm) 4.6 Area: (cm) 0.126 Volume: (cm) 0.578 % Reduction in Area: % Reduction in Volume: Epithelialization: None Tunneling: No Undermining: No Wound Description Classification: Full Thickness Without Exposed Support Structures Exudate Amount:  Large Exudate Type: Purulent Exudate Color: yellow, brown, green Foul Odor After Cleansing: No Slough/Fibrino No Wound Bed Granulation Amount: Medium (34-66%) Exposed Structure Granulation Quality: Red Fascia Exposed: No Necrotic Amount: Medium (34-66%) Fat Layer (Subcutaneous Tissue) Exposed: Yes Necrotic Quality: Adherent Slough Tendon Exposed: No Muscle Exposed: No Joint Exposed: No Bone Exposed: No Treatment Notes Wound #1 (Back) Wound Laterality: Midline, Distal Cleanser Normal Saline Discharge Instruction: Wash your hands with soap and water. Remove old dressing, discard into plastic bag and place into trash. Cleanse the wound with Normal Saline prior to applying a clean dressing using gauze sponges, not tissues or cotton balls. Do not scrub or use excessive force. Pat dry using gauze sponges, not tissue or cotton balls. Holz, Jadalee [name]. ([id number]) Peri-Wound Care Topical Primary Dressing Hydrofera Blue Classic Foam Rope Dressing, 9x6 (mm/in) Secondary Dressing Zetuvit Plus Silicone Non-bordered 5x5 (in/in) Secured With Compression Wrap Compression Stockings Add-Ons Electronic Signature(s) Signed: 10/19/2021 4:19:44 PM By: [name] Pax RN Entered By: [name] on 10/15/2021 09:14:29 Chipps, Faustine [name] Kitchen ([id number]) -------------------------------------------------------------------------------- Vitals Details Patient Name: Giampietro, Anayah [name]. Date of Service: 10/15/2021 8:45 AM Medical Record Number: [id number] Patient Account Number: [id number] Date of Birth/Sex: Nov 08, 1961 (60 y.o. F) Treating RN: [name] Primary Care Aleli Navedo: [name] Other Clinician: Referring Petula Rotolo: Card, [name] Treating Earl Losee/Extender: [name] in Treatment: 0 Vital Signs Time Taken: 08:56 Temperature (F): 98 Height (in): 63 Pulse (bpm): 81 Source: Stated Respiratory Rate (breaths/min): 18 Weight (lbs): 275 Blood Pressure (mmHg): 159/93 Source:  Measured Reference Range: 80 - 120 mg / dl Body Mass Index (BMI): 48.7 Electronic Signature(s) Signed: 10/19/2021 4:19:44 PM By: [name] Pax RN Entered By: [name] on 10/15/2021 08:56:45

## 2021-10-23 ENCOUNTER — Other Ambulatory Visit: Payer: Self-pay

## 2021-10-23 ENCOUNTER — Encounter: Payer: 59 | Admitting: Physician Assistant

## 2021-10-23 DIAGNOSIS — T8131XA Disruption of external operation (surgical) wound, not elsewhere classified, initial encounter: Secondary | ICD-10-CM | POA: Diagnosis not present

## 2021-10-23 NOTE — Progress Notes (Addendum)
[name], [name] ([id number]) Visit Report for 10/23/2021 Arrival Information Details Patient Name: [name], [name] [name] Date of Service: 10/23/2021 11:00 AM Medical Record Number: [id number] Patient Account Number: [id number] Date of Birth/Sex: [birth year] (60 y.o. F) Treating [name]: [name] Primary Care Lorain Fettes: [name] Other Clinician: Referring Nejla [name]: [name] Treating [name]/Extender: [name] in Treatment: 1 Visit Information History Since Last Visit All ordered tests and consults were completed: No Patient Arrived: [name] Added or deleted any medications: No Arrival Time: 11:22 Any new allergies or adverse reactions: No Accompanied By: self Had a fall or experienced change in No Transfer Assistance: None activities of daily living that may affect Patient Identification Verified: Yes risk of falls: Secondary Verification Process Completed: Yes Signs or symptoms of abuse/neglect since last visito No Patient Requires Transmission-Based Precautions: No Hospitalized since last visit: No Patient Has Alerts: No Implantable device outside of the clinic excluding No cellular tissue based products placed in the center since last visit: Has Dressing in Place as Prescribed: Yes Pain Present Now: No Electronic Signature(s) Signed: 10/23/2021 4:18:25 PM By: [name] Entered By: [name] on 10/23/2021 11:24:43 [name], [name] ([id number]) -------------------------------------------------------------------------------- Clinic Level of Care Assessment Details Patient Name: [name], [name] Date of Service: 10/23/2021 11:00 AM Medical Record Number: [id number] Patient Account Number: [id number] Date of Birth/Sex: [birth year] (60 y.o. F) Treating [name]: [name] Primary Care Claretta Kendra: [name] Other Clinician: Referring [name]: [name] Treating [name]: [name] in Treatment: 1 Clinic Level of Care Assessment  Items TOOL 4 Quantity Score X - Use when only an EandM is performed on FOLLOW-UP visit 1 0 ASSESSMENTS - Nursing Assessment / Reassessment X - Reassessment of Co-morbidities (includes updates in patient status) 1 10 X- 1 5 Reassessment of Adherence to Treatment Plan ASSESSMENTS - Wound and Skin Assessment / Reassessment X - Simple Wound Assessment / Reassessment - one wound 1 5 []  - 0 Complex Wound Assessment / Reassessment - multiple wounds []  - 0 Dermatologic / Skin Assessment (not related to wound area) ASSESSMENTS - Focused Assessment []  - Circumferential Edema Measurements - multi extremities 0 []  - 0 Nutritional Assessment / Counseling / Intervention []  - 0 Lower Extremity Assessment (monofilament, tuning fork, pulses) []  - 0 Peripheral Arterial Disease Assessment (using hand held doppler) ASSESSMENTS - Ostomy and/or Continence Assessment and Care []  - Incontinence Assessment and Management 0 []  - 0 Ostomy Care Assessment and Management (repouching, etc.) PROCESS - Coordination of Care X - Simple Patient / Family Education for ongoing care 1 15 []  - 0 Complex (extensive) Patient / Family Education for ongoing care X- 1 10 Staff obtains , Records, Test Results / Process Orders []  - 0 Staff telephones HHA, Nursing Homes / Clarify orders / etc []  - 0 Routine Transfer to another Facility (non-emergent condition) []  - 0 Routine Hospital Admission (non-emergent condition) []  - 0 New Admissions / / Ordering NPWT, Apligraf, etc. []  - 0 Emergency Hospital Admission (emergent condition) X- 1 10 Simple Discharge Coordination []  - 0 Complex (extensive) Discharge Coordination PROCESS - Special Needs []  - Pediatric / Minor Patient Management 0 []  - 0 Isolation Patient Management []  - 0 Hearing / Language / Visual special needs []  - 0 Assessment of Community assistance (transportation, D/[name] planning, etc.) []  - 0 Additional assistance /  Altered mentation []  - 0 Support Surface(s) Assessment (bed, cushion, seat, etc.) INTERVENTIONS - Wound Cleansing / Measurement [name], [name] ( ) X- 1 5 Simple Wound Cleansing -  one wound []  - 0 Complex Wound Cleansing - multiple wounds X- 1 5 Wound Imaging (photographs - any number of wounds) []  - 0 Wound Tracing (instead of photographs) X- 1 5 Simple Wound Measurement - one wound []  - 0 Complex Wound Measurement - multiple wounds INTERVENTIONS - Wound Dressings X - Small Wound Dressing one or multiple wounds 1 10 []  - 0 Medium Wound Dressing one or multiple wounds []  - 0 Large Wound Dressing one or multiple wounds []  - 0 Application of Medications - topical []  - 0 Application of Medications - injection INTERVENTIONS - Miscellaneous []  - External ear exam 0 []  - 0 Specimen Collection (cultures, biopsies, blood, body fluids, etc.) []  - 0 Specimen(s) / Culture(s) sent or taken to Lab for analysis []  - 0 Patient Transfer (multiple staff / / Similar devices) []  - 0 Simple Staple / Suture removal (25 or less) []  - 0 Complex Staple / Suture removal (26 or more) []  - 0 Hypo / Hyperglycemic Management (close monitor of Blood Glucose) []  - 0 Ankle / Brachial Index (ABI) - do not check if billed separately X- 1 5 Vital Signs Has the patient been seen at the hospital within the last three years: Yes Total Score: 85 Level Of Care: New/Established - Level 3 Electronic Signature(s) Signed: 10/23/2021 4:18:25 PM By: [name] Entered By: on 10/23/2021 12:01:16 [name] ( ) -------------------------------------------------------------------------------- Encounter Discharge Information Details Patient Name: [name], Naasia [name]. Date of Service: 10/23/2021 11:00 AM Medical Record Number: Patient Account Number: Date of Birth/Sex: [birth year] (60 y.o. F) Treating [name]: Primary Care  Jaquise Faux: Other Clinician: Referring Latronda Spink: [name] Treating Nykiah Ma/Extender: in Treatment: 1 Encounter Discharge Information Items Discharge Condition: Stable Ambulatory Status: Ambulatory Discharge Destination: Home Transportation: Private Auto Accompanied By: self Schedule Follow-up Appointment: Yes Clinical Summary of Care: Patient Declined Electronic Signature(s) Signed: 10/23/2021 12:02:31 PM By: 10/25/2021 [name] Entered By: [name] on 10/23/2021 12:02:31 [name], [name] (10/25/2021) -------------------------------------------------------------------------------- Lower Extremity Assessment Details Patient Name: [name], [name] Date of Service: 10/23/2021 11:00 AM Medical Record Number: [id number] Patient Account Number: 10/25/2021 Date of Birth/Sex: 10/11/1961 (60 y.o. F) Treating [name]: 09/11/1961 Primary Care Siomara Burkel: 66 Other Clinician: Referring Welda Azzarello: [name] Treating Reyann Troop/Extender: [name] Weeks in Treatment: 1 Electronic Signature(s) Signed: 10/23/2021 4:18:25 PM By: [name] Blase [name] Entered By: 10/25/2021 on 10/23/2021 11:39:31 Miotke, Aireanna CYevonne [name] (10/25/2021) -------------------------------------------------------------------------------- Multi Wound Chart Details Patient Name: [name], [name] Date of Service: 10/23/2021 11:00 AM Medical Record Number: 10/25/2021 Patient Account Number: [id number] Date of Birth/Sex: 1961-07-18 (60 y.o. F) Treating [name]: 66 Primary Care Mehmet Scally: [name] Other Clinician: Referring Aily Tzeng: [name] Treating Bertran Zeimet/Extender: [name] in Treatment: 1 Vital Signs Height(in): 63 Pulse(bpm): 80 Weight(lbs): 275 Blood Pressure(mmHg): 169/99 Body Mass Index(BMI): 49 Temperature(F): 98.1 Respiratory Rate(breaths/min): 18 Photos: [N/A:N/A] Wound Location: Distal, Midline Back N/A N/A Wounding Event: Surgical Injury N/A N/A Primary  Etiology: Dehisced Wound N/A N/A Comorbid History: Hypertension N/A N/A Date Acquired: 04/11/2021 N/A N/A Weeks of Treatment: 1 N/A N/A Wound Status: Open N/A N/A Measurements L x W x D (cm) 0.4x0.4x14 N/A N/A Area (cm) : 0.126 N/A N/A Volume (cm) : 1.759 N/A N/A % Reduction in Area: 0.00% N/A N/A % Reduction in Volume: -204.30% N/A N/A Classification: Full Thickness Without Exposed N/A N/A Support Structures Exudate Amount: Large N/A N/A Exudate Type: Serosanguineous N/A N/A Exudate Color: red, brown  N/A N/A Granulation Amount: Large (67-100%) N/A N/A Granulation Quality: Red N/A N/A Necrotic Amount: None Present (0%) N/A N/A Exposed Structures: Fat Layer (Subcutaneous Tissue): N/A N/A Yes Fascia: No Tendon: No Muscle: No Joint: No Bone: No Epithelialization: None N/A N/A Treatment Notes Electronic Signature(s) Signed: 10/23/2021 11:58:49 AM By: [name] Entered By: [name] on 10/23/2021 11:58:48 Tamburri, Sybella Salena [name] ([id number]) -------------------------------------------------------------------------------- Multi-Disciplinary Care Plan Details Patient Name: [name], [name] Date of Service: 10/23/2021 11:00 AM Medical Record Number: [id number] Patient Account Number: [id number] Date of Birth/Sex: [birth year] (60 y.o. F) Treating [name]: [name] Primary Care Symphani Eckstrom: [name] Other Clinician: Referring Magic Mohler: [name] Treating Kamilla Hands/Extender: [name] in Treatment: 1 Active Inactive Wound/Skin Impairment Nursing Diagnoses: Knowledge deficit related to ulceration/compromised skin integrity Goals: Patient/caregiver will verbalize understanding of skin care regimen Date Initiated: 10/15/2021 Target Resolution Date: 11/15/2021 Goal Status: Active Ulcer/skin breakdown will have a volume reduction of 30% by week 4 Date Initiated: 10/15/2021 Target Resolution Date: 12/15/2021 Goal Status: Active Ulcer/skin breakdown will have a volume  reduction of 50% by week 8 Date Initiated: 10/15/2021 Target Resolution Date: 01/15/2022 Goal Status: Active Ulcer/skin breakdown will have a volume reduction of 80% by week 12 Date Initiated: 10/15/2021 Target Resolution Date: 02/15/2022 Goal Status: Active Ulcer/skin breakdown will heal within 14 weeks Date Initiated: 10/15/2021 Target Resolution Date: 03/15/2022 Goal Status: Active Interventions: Assess patient/caregiver ability to obtain necessary supplies Assess patient/caregiver ability to perform ulcer/skin care regimen upon admission and as needed Assess ulceration(s) every visit Notes: Electronic Signature(s) Signed: 10/23/2021 11:58:36 AM By: [name] Entered By: [name] on 10/23/2021 11:58:35 [name], [name] ([id number]) -------------------------------------------------------------------------------- Pain Assessment Details Patient Name: [name], [name] Date of Service: 10/23/2021 11:00 AM Medical Record Number: [id number] Patient Account Number: [id number] Date of Birth/Sex: Oct 31, 1961 (60 y.o. F) Treating [name]: [name] Primary Care Violett Hobbs: [name] Other Clinician: Referring Zymir Napoli: [name] Treating Fergie Sherbert/Extender: [name] in Treatment: 1 Active Problems Location of Pain Severity and Description of Pain Patient Has Paino No Site Locations Pain Management and Medication Current Pain Management: Electronic Signature(s) Signed: 10/23/2021 4:18:25 PM By: [name] Entered By: [name] on 10/23/2021 11:29:01 [name], [name] ([id number]) -------------------------------------------------------------------------------- Patient/Caregiver Education Details Patient Name: Besancon, Leinani [name]. Date of Service: 10/23/2021 11:00 AM Medical Record Number: [id number] Patient Account Number: [id number] Date of Birth/Gender: 08-26-1961 (60 y.o. F) Treating [name]: [name] Primary Care Physician: [name] Other  Clinician: Referring Physician: Card, [name] Treating Physician/Extender: [name] in Treatment: 1 Education Assessment Education Provided To: Patient Education Topics Provided Wound/Skin Impairment: Methods: Explain/Verbal Responses: State content correctly Electronic Signature(s) Signed: 10/23/2021 4:18:25 PM By: [name] Entered By: [name] on 10/23/2021 12:01:37 [name], [name] ([id number]) -------------------------------------------------------------------------------- Wound Assessment Details Patient Name: [name], [name] Date of Service: 10/23/2021 11:00 AM Medical Record Number: [id number] Patient Account Number: [id number] Date of Birth/Sex: [birth year] (60 y.o. F) Treating [name]: [name] Primary Care Laniqua Torrens: [name] Other Clinician: Referring Shirl Ludington: [name] Treating Kaikoa Magro/Extender: [name] in Treatment: 1 Wound Status Wound Number: 1 Primary Etiology: Dehisced Wound Wound Location: Distal, Midline Back Wound Status: Open Wounding Event: Surgical Injury Comorbid History: Hypertension Date Acquired: 04/11/2021 Weeks Of Treatment: 1 Clustered Wound: No Photos Wound Measurements Length: (cm) 0.4 Width: (cm) 0.4 Depth: (cm) 14 Area: (cm) 0.126 Volume: (cm) 1.759 % Reduction in Area: 0% % Reduction in Volume: -204.3% Epithelialization: None Tunneling: No Undermining: No Wound Description Classification: Full Thickness Without Exposed  Support Structures Exudate Amount: Large Exudate Type: Serosanguineous Exudate Color: red, brown Foul Odor After Cleansing: No Slough/Fibrino No Wound Bed Granulation Amount: Large (67-100%) Exposed Structure Granulation Quality: Red Fascia Exposed: No Necrotic Amount: None Present (0%) Fat Layer (Subcutaneous Tissue) Exposed: Yes Tendon Exposed: No Muscle Exposed: No Joint Exposed: No Bone Exposed: No Treatment Notes Wound #1 (Back) Wound Laterality: Midline,  Distal Cleanser Normal Saline Discharge Instruction: Wash your hands with soap and water. Remove old dressing, discard into plastic bag and place into trash. Cleanse the wound with Normal Saline prior to applying a clean dressing using gauze sponges, not tissues or cotton balls. Do not scrub or use excessive force. Pat dry using gauze sponges, not tissue or cotton balls. Ruane, Kassidee [name]. ([id number]) Peri-Wound Care Topical Primary Dressing Hydrofera Blue Classic Foam Rope Dressing, 9x6 (mm/in) Secondary Dressing Zetuvit Plus Silicone Non-bordered 5x5 (in/in) Secured With Compression Wrap Compression Stockings Add-Ons Electronic Signature(s) Signed: 10/23/2021 4:18:25 PM By: [name] Entered By: [name] on 10/23/2021 11:37:23 Mika, Robinette CMarland Kitchen ([id number]) -------------------------------------------------------------------------------- Vitals Details Patient Name: Prestage, Lorisa [name]. Date of Service: 10/23/2021 11:00 AM Medical Record Number: [id number] Patient Account Number: [id number] Date of Birth/Sex: 1961/04/13 (60 y.o. F) Treating [name]: [name] Primary Care Margerie Fraiser: [name] Other Clinician: Referring Keondra Haydu: [name] Treating Lechelle Wrigley/Extender: [name] in Treatment: 1 Vital Signs Time Taken: 11:28 Temperature (F): 98.1 Height (in): 63 Pulse (bpm): 80 Weight (lbs): 275 Respiratory Rate (breaths/min): 18 Body Mass Index (BMI): 48.7 Blood Pressure (mmHg): 169/99 Reference Range: 80 - 120 mg / dl Electronic Signature(s) Signed: 10/23/2021 4:18:25 PM By: [name] Entered By: [name] on 10/23/2021 11:28:50

## 2021-10-23 NOTE — Progress Notes (Addendum)
NATONIA, TUCK ([id number]) Visit Report for 10/23/2021 Chief Complaint Document Details Patient Name: [name], [name] Date of Service: 10/23/2021 11:00 AM Medical Record Number: [id number] Patient Account Number: [id number] Date of Birth/Sex: 21-Nov-1961 (60 y.o. F) Treating RN: Yevonne Pax Primary Care Provider: [name] Other Clinician: Referring Provider: Card, John Treating Provider/Extender: Rowan Blase in Treatment: 1 Information Obtained from: Patient Chief Complaint Surgical Back Ulcer Electronic Signature(s) Signed: 10/23/2021 10:45:44 AM By: [name] PA-[name] Entered By: [name] on 10/23/2021 10:45:44 [name], [name] ([id number]) -------------------------------------------------------------------------------- HPI Details Patient Name: [name], [name] Date of Service: 10/23/2021 11:00 AM Medical Record Number: [id number] Patient Account Number: [id number] Date of Birth/Sex: 22-Apr-1961 (60 y.o. F) Treating RN: Yevonne Pax Primary Care Provider: [name] Other Clinician: Referring Provider: Card, John Treating Provider/Extender: Rowan Blase in Treatment: 1 History of Present Illness HPI Description: 10/15/2021 upon evaluation today patient presents for initial evaluation here in the clinic concerning a surgical ulceration/dehiscence in the lumbar spine region following surgery that she had over the past year. This was actually broken up into 3 separate surgical events. The initial surgical intervention actually was on November 05, 2021 almost a year ago. Subsequently the patient went back in February for a seroma of the area which unfortunately required her to have a repeat surgery to go in and clean this out. And then again this occurred in April where she went back in and again they felt like stitches were coming out and there was an additional seroma. She was placed in a wound VAC initially and then subsequently as it got smaller  that was discontinued. Again right now I will see anything that I think a wound VAC would help with. Nonetheless she definitely has a significant depth to the wound that is going require packing. I actually believe the Hydrofera Blue rope would probably do quite well with this the problem is as much as it is draining she probably needs this to be changed at least every day. She does not really have anyone that can help with that that is the complicating scenario here. With that being said the patient does have a history of multiple sclerosis, hypertension, and again this surgical wound dehiscence in regard to her lumbar spine region. She did have a repeat MRI which was actually completed 10/09/2021. This showed that there was no significant change in the subcutaneous fluid collection/track of the lower lumbar region. This is extending to the level of the fascia unfortunately. This seems to go all the way from the L2 level with a track extending all the way to the fascia at the L4-5 level. Again this is a significant wound and there is significant drainage but does not seem to communicate to the spinal region as far as spinal fluid or otherwise is concerned that is good news. Nonetheless she last saw Dr. Franky Macho who is her neurosurgeon on 10/01/2021 that was when he ordered this last MRI she supposed to see him next week as well. With that being said he did not feel like there was any significant issue there but was not sure why this was not healing that is when he ordered the MRI. They were wanting to make sure that this was packed appropriately by home health unfortunately the main issue currently is that home health is completely out of the picture as the patient has exhausted all the home health that that she gets for a year. She is now in a very difficult  predicament where she does not have anyone to help her change the dressing and to be honest that she is not able to do it herself with the location  of the wound being on the midline lumbar spine region. If she does not have anyone that can help it is probably can to be necessary for her to go to a facility for rehab and daily dressing changes as I feel like daily changes which is much drainage that she is having is going to be necessary. 10/23/2021 upon evaluation today patient appears to be doing decently well in regard to her back ulcer. This does seem to be draining a lot less than what it is been doing in the past. With that being said she still has quite a bit of drainage nonetheless. I do think that given time this should improve least I hope so. The good news is she does have home health coming out 3 days a week were doing it 2 days a week and she is paying someone we can to help. Electronic Signature(s) Signed: 10/23/2021 5:33:47 PM By: [name] PA-[name] Entered By: [name] on 10/23/2021 17:33:47 [name], [name] ([id number]) -------------------------------------------------------------------------------- Physical Exam Details Patient Name: [name] Date of Service: 10/23/2021 11:00 AM Medical Record Number: [id number] Patient Account Number: [id number] Date of Birth/Sex: August 26, 1961 (60 y.o. F) Treating RN: Yevonne Pax Primary Care Provider: [name] Other Clinician: Referring Provider: Card, John Treating Provider/Extender: Rowan Blase in Treatment: 1 Constitutional Well-nourished and well-hydrated in no acute distress. Respiratory normal breathing without difficulty. Psychiatric this patient is able to make decisions and demonstrates good insight into disease process. Alert and Oriented x 3. pleasant and cooperative. Notes Upon evaluation today patient's wound still has significant depth though the [facility] Blue does seem to be doing the best at this time. With that being said I do feel like that the patient's drainage is diminishing although I do think we still get a need to continue with  keeping this open and allowing it to drain appropriately we do not want to close prematurely that skin of doing her no good whatsoever. Electronic Signature(s) Signed: 10/23/2021 5:34:28 PM By: [name] PA-[name] Entered By: [name] on 10/23/2021 17:34:27 [place], [name] ([id number]) -------------------------------------------------------------------------------- Physician Orders Details Patient Name: [name], [name] Date of Service: 10/23/2021 11:00 AM Medical Record Number: [id number] Patient Account Number: [id number] Date of Birth/Sex: [birth year] (60 y.o. 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 o Nurse Visit as needed - nurse visit on thursday Home Health o [facility] for wound care. May utilize formulary equivalent dressing for wound treatment orders unless otherwise specified. Home Health Nurse may visit PRN to address patientos wound care needs. - 3 times per week [facility] fax [phone] Bathing/ Shower/ Hygiene o May shower; gently cleanse wound with antibacterial soap, rinse and pat dry prior to dressing wounds Edema Control - Lymphedema / Segmental Compressive Device / Other o Elevate, Exercise Daily and Avoid Standing for Long Periods of Time. o Elevate legs to the level of the heart and pump ankles as often as possible   o Elevate leg(s) parallel to the floor when sitting. Wound Treatment Wound #1 - Back Wound Laterality: Midline,  Distal Cleanser: Normal Saline 1 x Per Day/30 Days Discharge Instructions: Wash your hands with soap and water. Remove old dressing, discard into plastic bag and place into trash. Cleanse the wound with Normal Saline prior to applying a clean dressing using gauze sponges, not tissues or cotton balls. Do not scrub or use excessive force. Pat dry using gauze sponges, not tissue or cotton balls. Primary Dressing: Hydrofera Blue Classic Foam Rope Dressing, 9x6 (mm/in) 1 x Per Day/30 Days Secondary Dressing: Zetuvit Plus Silicone Non-bordered 5x5 (in/in) 1 x Per Day/30 Days Electronic Signature(s) Signed: 10/23/2021 12:00:23 PM By: Yevonne Pax RN Signed: 10/24/2021 4:54:49 PM By: [name] PA-[name] Entered By: Yevonne Pax on 10/23/2021 12:00:22 [name], [name] ([id number]) -------------------------------------------------------------------------------- Problem List Details Patient Name: Baston, Aminta [name]. Date of Service: 10/23/2021 11:00 AM Medical Record Number: [id number] Patient Account Number: [id number] Date of Birth/Sex: 12-09-1961 (60 y.o. 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 ([id number]) -------------------------------------------------------------------------------- Progress Note  Details Patient Name: [name], [name] Date of Service: 10/23/2021 11:00 AM Medical Record Number: [id number] Patient Account Number: [id number] Date of Birth/Sex: 1961/11/11 (60 y.o. F) Treating RN: Yevonne Pax Primary Care Provider: [name] Other Clinician: Referring Provider: Card, John Treating Provider/Extender: Rowan Blase in Treatment: 1 Subjective Chief Complaint Information obtained from Patient Surgical Back Ulcer History of Present Illness (HPI) 10/15/2021 upon evaluation today patient presents for initial evaluation here in the clinic concerning a surgical ulceration/dehiscence in the lumbar spine region following surgery that she had over the past year. This was actually broken up into 3 separate surgical events. The initial surgical intervention actually was on November 05, 2021 almost a year ago. Subsequently the patient went back in February for a seroma of the area which unfortunately required her to have a repeat surgery to go in and clean this out. And then again this occurred in April where she went back in and again they felt like stitches were coming out and there was an additional seroma. She was placed in a wound VAC initially and then subsequently as it got smaller that was discontinued. Again right now I will see anything that I think a wound VAC would help with. Nonetheless she definitely has a significant depth to the wound that is going require packing. I actually believe the Hydrofera Blue rope would probably do quite well with this the problem is as much as it is draining she probably needs this to be changed at least every day. She does not really have anyone that can help with that that is the complicating scenario here. With that being said the patient does have a history of multiple sclerosis, hypertension, and again this surgical wound dehiscence in regard to her lumbar spine region. She did have a repeat MRI which was actually completed  10/09/2021. This showed that there was no significant  change in the subcutaneous fluid collection/track of the lower lumbar region. This is extending to the level of the fascia unfortunately. This seems to go all the way from the L2 level with a track extending all the way to the fascia at the L4-5 level. Again this is a significant wound and there is significant drainage but does not seem to communicate to the spinal region as far as spinal fluid or otherwise is concerned that is good news. Nonetheless she last saw Dr. Franky Macho who is her neurosurgeon on 10/01/2021 that was when he ordered this last MRI she supposed to see him next week as well. With that being said he did not feel like there was any significant issue there but was not sure why this was not healing that is when he ordered the MRI. They were wanting to make sure that this was packed appropriately by home health unfortunately the main issue currently is that home health is completely out of the picture as the patient has exhausted all the home health that that she gets for a year. She is now in a very difficult predicament where she does not have anyone to help her change the dressing and to be honest that she is not able to do it herself with the location of the wound being on the midline lumbar spine region. If she does not have anyone that can help it is probably can to be necessary for her to go to a facility for rehab and daily dressing changes as I feel like daily changes which is much drainage that she is having is going to be necessary. 10/23/2021 upon evaluation today patient appears to be doing decently well in regard to her back ulcer. This does seem to be draining a lot less than what it is been doing in the past. With that being said she still has quite a bit of drainage nonetheless. I do think that given time this should improve least I hope so. The good news is she does have home health coming out 3 days a week were doing it  2 days a week and she is paying someone we can to help. Objective Constitutional Well-nourished and well-hydrated in no acute distress. Vitals Time Taken: 11:28 AM, Height: 63 in, Weight: 275 lbs, BMI: 48.7, Temperature: 98.1 F, [name]: 80 bpm, Respiratory Rate: 18 breaths/min, Blood Pressure: 169/99 mmHg. Respiratory normal breathing without difficulty. Psychiatric this patient is able to make decisions and demonstrates good insight into disease process. Alert and Oriented x 3. pleasant and cooperative. General Notes: Upon evaluation today patient's wound still has significant depth though the Hydrofera Blue does seem to be doing the best at this time. With that being said I do feel like that the patient's drainage is diminishing although I do think we still get a need to continue with keeping this open and allowing it to drain appropriately we do not want to close prematurely that skin of doing her no good whatsoever. Integumentary (Hair, Skin) Wound #1 status is Open. Original cause of wound was Surgical Injury. The date acquired was: 04/11/2021. The wound has been in treatment 1 weeks. The wound is located on the Distal,Midline Back. The wound measures 0.4cm length x 0.4cm width x 14cm depth; 0.126cm^2 area and [name], [name] ([id number]) 1.759cm^3 volume. There is Fat Layer (Subcutaneous Tissue) exposed. There is no tunneling or undermining noted. There is a large amount of serosanguineous drainage noted. There is large (67-100%) red granulation within the wound bed.  There is no necrotic tissue within the wound bed. 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 Nurse Visit as needed - nurse visit on thursday Home  Health: [facility] for wound care. May utilize formulary equivalent dressing for wound treatment orders unless otherwise specified. Home Health Nurse may visit PRN to address patient s wound care needs. - 3 times per week [facility] fax [phone] Bathing/ Shower/ Hygiene: May shower; gently cleanse wound with antibacterial soap, rinse and pat dry prior to dressing wounds Edema Control - Lymphedema / Segmental Compressive Device / Other: Elevate, Exercise Daily and Avoid Standing for Long Periods of Time. Elevate legs to the level of the heart and pump ankles as often as possible Elevate leg(s) parallel to the floor when sitting. WOUND #1: - Back Wound Laterality: Midline, Distal Cleanser: Normal Saline 1 x Per Day/30 Days Discharge Instructions: Wash your hands with soap and water. Remove old dressing, discard into plastic bag and place into trash. Cleanse the wound with Normal Saline prior to applying a clean dressing using gauze sponges, not tissues or cotton balls. Do not scrub or use excessive force. Pat dry using gauze sponges, not tissue or cotton balls. Primary Dressing: Hydrofera Blue Classic Foam Rope Dressing, 9x6 (mm/in) 1 x Per Day/30 Days Secondary Dressing: Zetuvit Plus Silicone Non-bordered 5x5 (in/in) 1 x Per Day/30 Days 1. I am also going to recommend that we continue with the [facility] Blue rope which I think is still the best way to go she is in agreement with that plan. 2. Also can recommend that we have the patient continue to monitor for any signs of worsening or infection. Obviously if she develops any symptoms such as fevers, chills, nausea, vomiting, or diarrhea she should let us know soon as possible. 3. I am also can recommend that the patient should continue with the Zetuvit border foam dressing to cover or an equal alternative. We will see patient back for reevaluation in 1 week here in the clinic. If anything worsens or changes patient will contact our  office for additional recommendations. Electronic Signature(s) Signed: 10/23/2021 5:35:01 PM By: [name] PA-[name] Entered By: [name] on 10/23/2021 17:35:01 [name], [name] ([id number]) -------------------------------------------------------------------------------- SuperBill Details Patient Name: [name], [name] Date of Service: 10/23/2021 Medical Record Number: [id number] Patient Account Number: [id number] Date of Birth/Sex: [birth year] (60 y.o. 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 G35 Multiple sclerosis I10 Essential (primary) hypertension Facility Procedures CPT4 Code: [id number] Description: 99213 - WOUND CARE VISIT-LEV 3 EST PT Modifier: Quantity: 1 Physician Procedures CPT4 Code: [id number] Description: 99214 - WC PHYS LEVEL 4 - EST PT Modifier: Quantity: 1 CPT4 Code: Description: ICD-10 Diagnosis Description T81.31XA Disruption of external operation (surgical) wound, not elsewhere classifi 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

## 2021-10-24 ENCOUNTER — Other Ambulatory Visit (HOSPITAL_BASED_OUTPATIENT_CLINIC_OR_DEPARTMENT_OTHER): Payer: Self-pay | Admitting: Neurosurgery

## 2021-10-24 DIAGNOSIS — M96842 Postprocedural seroma of a musculoskeletal structure following a musculoskeletal system procedure: Secondary | ICD-10-CM

## 2021-10-25 ENCOUNTER — Other Ambulatory Visit: Payer: Self-pay

## 2021-10-25 DIAGNOSIS — T8131XA Disruption of external operation (surgical) wound, not elsewhere classified, initial encounter: Secondary | ICD-10-CM | POA: Diagnosis not present

## 2021-10-25 NOTE — Progress Notes (Signed)
VIANA, SLEEP ([id number]) Visit Report for 10/25/2021 Arrival Information Details Patient Name: [name], [name] Date of Service: 10/25/2021 11:30 AM Medical Record Number: [id number] Patient Account Number: [id number] Date of Birth/Sex: March 22, 1961 (60 y.o. F) Treating RN: Huel Coventry Primary Care Danniell Rotundo: [name] Other Clinician: Referring Emilee Market: Card, John Treating Raunel Dimartino/Extender: Rowan Blase in Treatment: 1 Visit Information History Since Last Visit Has Dressing in Place as Prescribed: Yes Patient Arrived: Walker Pain Present Now: Yes Arrival Time: 11:50 Transfer Assistance: None Patient Identification Verified: Yes Secondary Verification Process Completed: Yes Patient Requires Transmission-Based Precautions: No Patient Has Alerts: No Electronic Signature(s) Signed: 10/25/2021 5:37:36 PM By: Elliot Gurney, BSN, RN, CWS, Kim RN, BSN Entered By: Elliot Gurney, BSN, RN, CWS, Kim on 10/25/2021 11:51:26 Maxim, Domingo Pulse ([id number]) -------------------------------------------------------------------------------- Clinic Level of Care Assessment Details Patient Name: [name], [name] Date of Service: 10/25/2021 11:30 AM Medical Record Number: [id number] Patient Account Number: [id number] Date of Birth/Sex: 1961-05-14 (60 y.o. F) Treating RN: Huel Coventry Primary Care Shimon Trowbridge: [name] Other Clinician: Referring Trell Secrist: Card, John Treating Lenette Rau/Extender: Rowan Blase in Treatment: 1 Clinic Level of Care Assessment Items TOOL 4 Quantity Score []  - Use when only an EandM is performed on FOLLOW-UP visit 0 ASSESSMENTS - Nursing Assessment / Reassessment []  - Reassessment of Co-morbidities (includes updates in patient status) 0 X- 1 5 Reassessment of Adherence to Treatment Plan ASSESSMENTS - Wound and Skin Assessment / Reassessment X - Simple Wound Assessment / Reassessment - one wound 1 5 []  - 0 Complex Wound Assessment / Reassessment - multiple wounds []  -  0 Dermatologic / Skin Assessment (not related to wound area) ASSESSMENTS - Focused Assessment []  - Circumferential Edema Measurements - multi extremities 0 []  - 0 Nutritional Assessment / Counseling / Intervention []  - 0 Lower Extremity Assessment (monofilament, tuning fork, pulses) []  - 0 Peripheral Arterial Disease Assessment (using hand held doppler) ASSESSMENTS - Ostomy and/or Continence Assessment and Care []  - Incontinence Assessment and Management 0 []  - 0 Ostomy Care Assessment and Management (repouching, etc.) PROCESS - Coordination of Care []  - Simple Patient / Family Education for ongoing care 0 []  - 0 Complex (extensive) Patient / Family Education for ongoing care []  - 0 Staff obtains , Records, Test Results / Process Orders []  - 0 Staff telephones HHA, Nursing Homes / Clarify orders / etc []  - 0 Routine Transfer to another Facility (non-emergent condition) []  - 0 Routine Hospital Admission (non-emergent condition) []  - 0 New Admissions / / Ordering NPWT, Apligraf, etc. []  - 0 Emergency Hospital Admission (emergent condition) X- 1 10 Simple Discharge Coordination []  - 0 Complex (extensive) Discharge Coordination PROCESS - Special Needs []  - Pediatric / Minor Patient Management 0 []  - 0 Isolation Patient Management []  - 0 Hearing / Language / Visual special needs []  - 0 Assessment of Community assistance (transportation, D/C planning, etc.) []  - 0 Additional assistance / Altered mentation []  - 0 Support Surface(s) Assessment (bed, cushion, seat, etc.) INTERVENTIONS - Wound Cleansing / Measurement [name], [name] ( ) X- 1 5 Simple Wound Cleansing - one wound []  - 0 Complex Wound Cleansing - multiple wounds []  - 0 Wound Imaging (photographs - any number of wounds) []  - 0 Wound Tracing (instead of photographs) []  - 0 Simple Wound Measurement - one wound []  - 0 Complex Wound Measurement - multiple  wounds INTERVENTIONS - Wound Dressings []  - Small Wound Dressing one or multiple wounds 0 X- 1 15 Medium Wound Dressing one or multiple  wounds []  - 0 Large Wound Dressing one or multiple wounds []  - 0 Application of Medications - topical []  - 0 Application of Medications - injection INTERVENTIONS - Miscellaneous []  - External ear exam 0 []  - 0 Specimen Collection (cultures, biopsies, blood, body fluids, etc.) []  - 0 Specimen(s) / Culture(s) sent or taken to Lab for analysis []  - 0 Patient Transfer (multiple staff / / Similar devices) []  - 0 Simple Staple / Suture removal (25 or less) []  - 0 Complex Staple / Suture removal (26 or more) []  - 0 Hypo / Hyperglycemic Management (close monitor of Blood Glucose) []  - 0 Ankle / Brachial Index (ABI) - do not check if billed separately []  - 0 Vital Signs Has the patient been seen at the hospital within the last three years: Yes Total Score: 40 Level Of Care: New/Established - Level 2 Electronic Signature(s) Signed: 10/25/2021 5:37:36 PM By: , BSN, RN, CWS, Kim RN, BSN Entered By: , BSN, RN, CWS, Kim on 10/25/2021 11:58:52 [name], ( ) -------------------------------------------------------------------------------- Encounter Discharge Information Details Patient Name: [name], [name] Date of Service: 10/25/2021 11:30 AM Medical Record Number: Patient Account Number: Date of Birth/Sex: 01-30-1961 (60 y.o. F) Treating RN: Primary Care Danicka Hourihan: [name] Other Clinician: Referring Kiyonna Tortorelli: Card, John Treating Maame Dack/Extender: Elliot Gurney in Treatment: 1 Encounter Discharge Information Items Discharge Condition: Stable Ambulatory Status: Walker Discharge Destination: Home Transportation: Private Auto Schedule Follow-up Appointment: Yes Clinical Summary of Care: Electronic Signature(s) Signed: 10/25/2021 5:37:36 PM By: [name], BSN, RN, CWS,  Kim RN, BSN Entered By: Domingo Pulse, BSN, RN, CWS, Kim on 10/25/2021 11:58:22 [name], [name] ([id number]) -------------------------------------------------------------------------------- Pain Assessment Details Patient Name: [name], [name] Date of Service: 10/25/2021 11:30 AM Medical Record Number: 09/11/1961 Patient Account Number: [id number] Date of Birth/Sex: 03/06/1961 (60 y.o. F) Treating RN: Rowan Blase Primary Care Moshe Wenger: [name] Other Clinician: Referring Kimari Lienhard: Card, John Treating Eddison Searls/Extender: Elliot Gurney in Treatment: 1 Active Problems Location of Pain Severity and Description of Pain Patient Has Paino Yes Site Locations Pain Location: Pain in Ulcers Rate the pain. Current Pain Level: 3 Pain Management and Medication Current Pain Management: Electronic Signature(s) Signed: 10/25/2021 5:37:36 PM By: [name], BSN, RN, CWS, Kim RN, BSN Entered By: Domingo Pulse, BSN, RN, CWS, Kim on 10/25/2021 11:51:47 [name], [name] ([id number]) -------------------------------------------------------------------------------- Wound Assessment Details Patient Name: [name], [name] Date of Service: 10/25/2021 11:30 AM Medical Record Number: 09/11/1961 Patient Account Number: [id number] Date of Birth/Sex: 10/28/1961 (60 y.o. F) Treating RN: Rowan Blase Primary Care Muaaz Brau: [name] Other Clinician: Referring Lougenia Morrissey: Card, John Treating Isami Mehra/Extender: Elliot Gurney in Treatment: 1 Wound Status Wound Number: 1 Primary Etiology: Dehisced Wound Wound Location: Distal, Midline Back Wound Status: Open Wounding Event: Surgical Injury Comorbid History: Hypertension Date Acquired: 04/11/2021 Weeks Of Treatment: 1 Clustered Wound: No Wound Measurements Length: (cm) 0.4 Width: (cm) 0.4 Depth: (cm) 14 Area: (cm) 0.126 Volume: (cm) 1.759 % Reduction in Area: 0% % Reduction in Volume: -204.3% Epithelialization: None Wound Description Classification: Full  Thickness Without Exposed Support Structu Exudate Amount: Large Exudate Type: Serosanguineous Exudate Color: red, brown res Foul Odor After Cleansing: No Slough/Fibrino No Wound Bed Granulation Amount: Large (67-100%) Exposed Structure Granulation Quality: Red Fascia Exposed: No Necrotic Amount: None Present (0%) Fat Layer (Subcutaneous Tissue) Exposed: Yes Tendon Exposed: No Muscle Exposed: No Joint Exposed: No Bone Exposed: No Treatment Notes Wound #1 (Back) Wound Laterality: Midline, Distal Cleanser Normal Saline Discharge Instruction: Wash your hands  with soap and water. Remove old dressing, discard into plastic bag and place into trash. Cleanse the wound with Normal Saline prior to applying a clean dressing using gauze sponges, not tissues or cotton balls. Do not scrub or use excessive force. Pat dry using gauze sponges, not tissue or cotton balls. Peri-Wound Care Topical Primary Dressing Hydrofera Blue Classic Foam Rope Dressing, 9x6 (mm/in) Secondary Dressing Zetuvit Plus Silicone Non-bordered 5x5 (in/in) Secured With Compression Wrap Compression Stockings Add-Ons EMANUEL, CAMPOS ([id number]) Electronic Signature(s) Signed: 10/25/2021 5:37:36 PM By: Elliot Gurney, BSN, RN, CWS, Kim RN, BSN Entered By: Elliot Gurney, BSN, RN, CWS, Kim on 10/25/2021 11:57:27

## 2021-10-30 ENCOUNTER — Other Ambulatory Visit: Payer: Self-pay

## 2021-10-30 ENCOUNTER — Encounter: Payer: 59 | Attending: Physician Assistant | Admitting: Physician Assistant

## 2021-10-30 DIAGNOSIS — L98422 Non-pressure chronic ulcer of back with fat layer exposed: Secondary | ICD-10-CM | POA: Insufficient documentation

## 2021-10-30 DIAGNOSIS — Y838 Other surgical procedures as the cause of abnormal reaction of the patient, or of later complication, without mention of misadventure at the time of the procedure: Secondary | ICD-10-CM | POA: Diagnosis not present

## 2021-10-30 DIAGNOSIS — I10 Essential (primary) hypertension: Secondary | ICD-10-CM | POA: Insufficient documentation

## 2021-10-30 DIAGNOSIS — T8131XA Disruption of external operation (surgical) wound, not elsewhere classified, initial encounter: Secondary | ICD-10-CM | POA: Insufficient documentation

## 2021-10-30 DIAGNOSIS — G35 Multiple sclerosis: Secondary | ICD-10-CM | POA: Insufficient documentation

## 2021-10-30 NOTE — Progress Notes (Addendum)
[name] ([id number]) Visit Report for 10/30/2021 Chief Complaint Document Details Patient Name: [name], [name] Date of Service: 10/30/2021 11:00 AM Medical Record Number: [id number] Patient Account Number: [id number] Date of Birth/Sex: 03/14/1961 (60 y.o. F) Treating RN: Yevonne Pax Primary Care Provider: [name] Other Clinician: Referring Provider: Card, John Treating Provider/Extender: Rowan Blase in Treatment: 2 Information Obtained from: Patient Chief Complaint Surgical Back Ulcer Electronic Signature(s) Signed: 10/30/2021 11:47:07 AM By: [name] PA-C Entered By: [name] on 10/30/2021 11:47:06 Emily, Lashawnda CMarland Kitchen ([id number]) -------------------------------------------------------------------------------- HPI Details Patient Name: [name], [name] Date of Service: 10/30/2021 11:00 AM Medical Record Number: [id number] Patient Account Number: [id number] Date of Birth/Sex: 04-Mar-1961 (60 y.o. F) Treating RN: Yevonne Pax Primary Care Provider: [name] Other Clinician: Referring Provider: Card, John Treating Provider/Extender: Rowan Blase in Treatment: 2 History of Present Illness HPI Description: 10/15/2021 upon evaluation today patient presents for initial evaluation here in the clinic concerning a surgical ulceration/dehiscence in the lumbar spine region following surgery that she had over the past year. This was actually broken up into 3 separate surgical events. The initial surgical intervention actually was on November 05, 2021 almost a year ago. Subsequently the patient went back in February for a seroma of the area which unfortunately required her to have a repeat surgery to go in and clean this out. And then again this occurred in April where she went back in and again they felt like stitches were coming out and there was an additional seroma. She was placed in a wound VAC initially and then subsequently as it got smaller that  was discontinued. Again right now I will see anything that I think a wound VAC would help with. Nonetheless she definitely has a significant depth to the wound that is going require packing. I actually believe the Hydrofera Blue rope would probably do quite well with this the problem is as much as it is draining she probably needs this to be changed at least every day. She does not really have anyone that can help with that that is the complicating scenario here. With that being said the patient does have a history of multiple sclerosis, hypertension, and again this surgical wound dehiscence in regard to her lumbar spine region. She did have a repeat MRI which was actually completed 10/09/2021. This showed that there was no significant change in the subcutaneous fluid collection/track of the lower lumbar region. This is extending to the level of the fascia unfortunately. This seems to go all the way from the L2 level with a track extending all the way to the fascia at the L4-5 level. Again this is a significant wound and there is significant drainage but does not seem to communicate to the spinal region as far as spinal fluid or otherwise is concerned that is good news. Nonetheless she last saw Dr. Franky Macho who is her neurosurgeon on 10/01/2021 that was when he ordered this last MRI she supposed to see him next week as well. With that being said he did not feel like there was any significant issue there but was not sure why this was not healing that is when he ordered the MRI. They were wanting to make sure that this was packed appropriately by home health unfortunately the main issue currently is that home health is completely out of the picture as the patient has exhausted all the home health that that she gets for a year. She is now in a very difficult  predicament where she does not have anyone to help her change the dressing and to be honest that she is not able to do it herself with the location of  the wound being on the midline lumbar spine region. If she does not have anyone that can help it is probably can to be necessary for her to go to a facility for rehab and daily dressing changes as I feel like daily changes which is much drainage that she is having is going to be necessary. 10/23/2021 upon evaluation today patient appears to be doing decently well in regard to her back ulcer. This does seem to be draining a lot less than what it is been doing in the past. With that being said she still has quite a bit of drainage nonetheless. I do think that given time this should improve least I hope so. The good news is she does have home health coming out 3 days a week were doing it 2 days a week and she is paying someone we can to help. 10/30/2021 upon evaluation today patient appears to be doing okay in regard to her back ulcer this is not draining quite as bad as it was in the beginning but he still has quite a bit of drainage noted. I do believe that the patient would benefit from [place] going ahead forward with attempting a wound VAC using the Hydrofera Blue rope to pack with and then subsequently using the VAC externally to actually suction out and help this to fill- in. I think this is our ideal way to try to get things cleared at this point. As it stands I am not certain that we are really making a progress that we want to see near with doing it in the way we are which is packing with the rope. It is a good dressing but I do think it is insufficient for total healing. She just seems to have too much in the way of drainage at this point unfortunately. Electronic Signature(s) Signed: 10/30/2021 1:45:30 PM By: [name] PA-C Entered By: [name] on 10/30/2021 13:45:30 Dietz, Domingo Pulse ([id number]) -------------------------------------------------------------------------------- Physical Exam Details Patient Name: [name], [name] Date of Service: 10/30/2021 11:00 AM Medical  Record Number: [id number] Patient Account Number: [id number] Date of Birth/Sex: October 01, 1961 (60 y.o. F) Treating RN: Yevonne Pax Primary Care Provider: [name] Other Clinician: Referring Provider: Card, John Treating Provider/Extender: Rowan Blase in Treatment: 2 Constitutional Well-nourished and well-hydrated in no acute distress. Respiratory normal breathing without difficulty. Psychiatric this patient is able to make decisions and demonstrates good insight into disease process. Alert and Oriented x 3. pleasant and cooperative. Notes Patient's wound again cannot be visualized internally but seems to be doing a little better from the standpoint of drainage that is good news nonetheless I do feel like that her drainage is a little bit less than what we were seen in the beginning but still quite significant. Electronic Signature(s) Signed: 10/30/2021 1:46:34 PM By: [name] PA-C Entered By: [name] on 10/30/2021 13:46:34 Dudzik, Domingo Pulse ([id number]) -------------------------------------------------------------------------------- Physician Orders Details Patient Name: Wisener, Naveya C. Date of Service: 10/30/2021 11:00 AM Medical Record Number: [id number] Patient Account Number: [id number] Date of Birth/Sex: May 25, 1961 (60 y.o. 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 o Nurse Visit as needed - nurse visit on thursday Home Health o [facility] for wound  care. May utilize formulary equivalent dressing for wound treatment orders unless otherwise specified. Home Health Nurse may visit PRN to address patientos wound care needs. - 3 times per week [facility] fax [phone] Bathing/ Shower/ Hygiene o May shower; gently cleanse wound with antibacterial soap, rinse and pat dry prior to dressing wounds Edema Control - Lymphedema / Segmental Compressive Device / Other o Elevate, Exercise Daily and Avoid Standing for Long Periods of Time. o Elevate legs to the level of the heart and pump ankles as often as possible o Elevate leg(s) parallel to the floor when sitting. Wound Treatment Wound #1 - Back Wound Laterality: Midline, Distal Cleanser: Normal Saline 1 x Per Day/30 Days Discharge Instructions: Wash your hands with soap and water. Remove old dressing, discard into plastic bag and place into trash. Cleanse the wound with Normal Saline prior to applying a clean dressing using gauze sponges, not tissues or cotton balls. Do not scrub or use excessive force. Pat dry using gauze sponges, not tissue or cotton balls. Primary Dressing: Hydrofera Blue Classic Foam Rope Dressing, 9x6 (mm/in) 1 x Per Day/30 Days Secondary Dressing: Zetuvit Plus Silicone Non-bordered 5x5 (in/in) 1 x Per Day/30 Days Electronic Signature(s) Signed: 10/30/2021 12:01:27 PM By: Yevonne Pax RN Signed: 10/31/2021 6:58:05 PM By: [name] PA-C Entered By: Yevonne Pax on 10/30/2021 12:01:27 Simones, Ameisha C. ([id number]) -------------------------------------------------------------------------------- Problem List Details Patient Name: Nehring, Keonda C. Date of Service: 10/30/2021 11:00 AM Medical Record Number: [id number] Patient Account Number: [id number] Date of Birth/Sex: [birth year] (60 y.o. 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 ([id number]) -------------------------------------------------------------------------------- Progress Note Details Patient Name: Rucci, Elynor C. Date of Service: 10/30/2021 11:00 AM Medical Record Number: [id number] Patient Account Number: [id number] Date of Birth/Sex: 18-Nov-1961 (60 y.o. F) Treating RN: Yevonne Pax Primary Care Provider: [name] Other Clinician: Referring Provider: Card, John Treating Provider/Extender: Rowan Blase in Treatment: 2 Subjective Chief Complaint Information obtained from Patient Surgical Back Ulcer History of Present Illness (HPI) 10/15/2021 upon evaluation today patient presents for initial evaluation here in the clinic concerning a surgical ulceration/dehiscence in the lumbar spine region following surgery that she had over the past year. This was actually broken up into 3 separate surgical events. The initial surgical intervention actually was on November 05, 2021 almost a year ago. Subsequently the patient went back in February for a seroma of the area which unfortunately required her to have a repeat surgery to go in and clean this out. And then again this occurred in April where she went back in and again they felt like stitches were coming out and there was an additional seroma. She was placed in a wound VAC  initially and then subsequently as it got smaller that was  discontinued. Again right now I will see anything that I think a wound VAC would help with. Nonetheless she definitely has a significant depth to the wound that is going require packing. I actually believe the Hydrofera Blue rope would probably do quite well with this the problem is as much as it is draining she probably needs this to be changed at least every day. She does not really have anyone that can help with that that is the complicating scenario here. With that being said the patient does have a history of multiple sclerosis, hypertension, and again this surgical wound dehiscence in regard to her lumbar spine region. She did have a repeat MRI which was actually completed 10/09/2021. This showed that there was no significant change in the subcutaneous fluid collection/track of the lower lumbar region. This is extending to the level of the fascia unfortunately. This seems to go all the way from the L2 level with a track extending all the way to the fascia at the L4-5 level. Again this is a significant wound and there is significant drainage but does not seem to communicate to the spinal region as far as spinal fluid or otherwise is concerned that is good news. Nonetheless she last saw Dr. Franky Macho who is her neurosurgeon on 10/01/2021 that was when he ordered this last MRI she supposed to see him next week as well. With that being said he did not feel like there was any significant issue there but was not sure why this was not healing that is when he ordered the MRI. They were wanting to make sure that this was packed appropriately by home health unfortunately the main issue currently is that home health is completely out of the picture as the patient has exhausted all the home health that that she gets for a year. She is now in a very difficult predicament where she does not have anyone to help her change the dressing and to be honest that she is not able to do it herself with the location of the  wound being on the midline lumbar spine region. If she does not have anyone that can help it is probably can to be necessary for her to go to a facility for rehab and daily dressing changes as I feel like daily changes which is much drainage that she is having is going to be necessary. 10/23/2021 upon evaluation today patient appears to be doing decently well in regard to her back ulcer. This does seem to be draining a lot less than what it is been doing in the past. With that being said she still has quite a bit of drainage nonetheless. I do think that given time this should improve least I hope so. The good news is she does have home health coming out 3 days a week were doing it 2 days a week and she is paying someone we can to help. 10/30/2021 upon evaluation today patient appears to be doing okay in regard to her back ulcer this is not draining quite as bad as it was in the beginning but he still has quite a bit of drainage noted. I do believe that the patient would benefit from [place] going ahead forward with attempting a wound VAC using the Hydrofera Blue rope to pack with and then subsequently using the VAC externally to actually suction out and help this to fill- in. I think this is our ideal way  to try to get things cleared at this point. As it stands I am not certain that we are really making a progress that we want to see near with doing it in the way we are which is packing with the rope. It is a good dressing but I do think it is insufficient for total healing. She just seems to have too much in the way of drainage at this point unfortunately. Objective Constitutional Well-nourished and well-hydrated in no acute distress. Vitals Time Taken: 11:35 AM, Height: 63 in, Weight: 275 lbs, BMI: 48.7, Temperature: 97.5 F, Pulse: 73 bpm, Respiratory Rate: 18 breaths/min, Blood Pressure: 177/74 mmHg. Respiratory normal breathing without difficulty. Psychiatric this patient is able to make  decisions and demonstrates good insight into disease process. Alert and Oriented x 3. pleasant and cooperative. Hodder, Hazel C. ([id number]) General Notes: Patient's wound again cannot be visualized internally but seems to be doing a little better from the standpoint of drainage that is good news nonetheless I do feel like that her drainage is a little bit less than what we were seen in the beginning but still quite significant. Integumentary (Hair, Skin) Wound #1 status is Open. Original cause of wound was Surgical Injury. The date acquired was: 04/11/2021. The wound has been in treatment 2 weeks. The wound is located on the Distal,Midline Back. The wound measures 0.3cm length x 0.3cm width x 13cm depth; 0.071cm^2 area and 0.919cm^3 volume. There is Fat Layer (Subcutaneous Tissue) exposed. There is no tunneling or undermining noted. There is a large amount of serosanguineous drainage noted. There is large (67-100%) red granulation within the wound bed. There is no necrotic tissue within the wound bed. 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 Nurse Visit as needed - nurse visit on thursday Home Health: [facility] for wound care. May utilize formulary equivalent dressing for wound treatment orders unless otherwise specified. Home Health Nurse may visit PRN to address patient s wound care needs. - 3 times per week [facility] fax [phone] Bathing/ Shower/ Hygiene: May shower; gently cleanse wound with antibacterial soap, rinse and pat dry prior to dressing wounds Edema Control - Lymphedema / Segmental Compressive Device / Other: Elevate, Exercise Daily and Avoid Standing for Long Periods of Time. Elevate legs to the level of  the heart and pump ankles as often as possible Elevate leg(s) parallel to the floor when sitting. WOUND #1: - Back Wound Laterality: Midline, Distal Cleanser: Normal Saline 1 x Per Day/30 Days Discharge Instructions: Wash your hands with soap and water. Remove old dressing, discard into plastic bag and place into trash. Cleanse the wound with Normal Saline prior to applying a clean dressing using gauze sponges, not tissues or cotton balls. Do not scrub or use excessive force. Pat dry using gauze sponges, not tissue or cotton balls. Primary Dressing: Hydrofera Blue Classic Foam Rope Dressing, 9x6 (mm/in) 1 x Per Day/30 Days Secondary Dressing: Zetuvit Plus Silicone Non-bordered 5x5 (in/in) 1 x Per Day/30 Days 1. Would recommend that we going continue with the Hydrofera Blue rope which I think is a good option here as far as packing is concerned but I do feel like she would benefit from using a wound VAC externally to help actually suction out and not just trapped fluid here. Ask is awake but I am not certain that  it is actually draining enough of this out I think a lot of still sitting internally. 2. We will see about getting approval for the wound VAC which the patient is in agreement with as well. 3. I am also can recommend for now we continue to cover with a Zetuvit border foam dressing. We will see patient back for reevaluation in 1 week here in the clinic. If anything worsens or changes patient will contact our office for additional recommendations. Electronic Signature(s) Signed: 10/30/2021 1:47:05 PM By: [name] PA-C Entered By: [name] on 10/30/2021 13:47:04 Treu, Domingo Pulse ([id number]) -------------------------------------------------------------------------------- SuperBill Details Patient Name: Garramone, Athelene C. Date of Service: 10/30/2021 Medical Record Number: [id number] Patient Account Number: [id number] Date of Birth/Sex: September 20, 1961 (60 y.o. 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 G35 Multiple sclerosis I10 Essential (primary) hypertension Facility Procedures CPT4 Code: [id number] Description: 99213 - WOUND CARE VISIT-LEV 3 EST PT Modifier: Quantity: 1 Physician Procedures CPT4 Code: [id number] Description: 99214 - WC PHYS LEVEL 4 - EST PT Modifier: Quantity: 1 CPT4 Code: Description: ICD-10 Diagnosis Description T81.31XA Disruption of external operation (surgical) wound, not elsewhere classifi 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

## 2021-11-01 ENCOUNTER — Other Ambulatory Visit: Payer: Self-pay

## 2021-11-01 DIAGNOSIS — T8131XA Disruption of external operation (surgical) wound, not elsewhere classified, initial encounter: Secondary | ICD-10-CM | POA: Diagnosis not present

## 2021-11-01 NOTE — Progress Notes (Addendum)
EFFA, YARROW ([id number]) Visit Report for 11/01/2021 Arrival Information Details Patient Name: Kats, RIKA DAUGHDRILL. Date of Service: 11/01/2021 11:30 AM Medical Record Number: [id number] Patient Account Number: [id number] Date of Birth/Sex: 06-09-1961 (60 y.o. F) Treating RN: Huel Coventry Primary Care Annemarie Sebree: [name] Other Clinician: Referring Bennet Kujawa: Card, John Treating Jerzy Crotteau/Extender: Rowan Blase in Treatment: 2 Visit Information History Since Last Visit Added or deleted any medications: No Patient Arrived: Ambulatory Has Dressing in Place as Prescribed: Yes Arrival Time: 12:15 Pain Present Now: No Accompanied By: self Transfer Assistance: None Patient Identification Verified: Yes Secondary Verification Process Completed: Yes Patient Requires Transmission-Based Precautions: No Patient Has Alerts: No Electronic Signature(s) Signed: 11/01/2021 12:33:10 PM By: Elliot Gurney, BSN, RN, CWS, Kim RN, BSN Entered By: Elliot Gurney, BSN, RN, CWS, Kim on 11/01/2021 12:33:10 Bas, Domingo Pulse ([id number]) -------------------------------------------------------------------------------- Clinic Level of Care Assessment Details Patient Name: Downs, Laina C. Date of Service: 11/01/2021 11:30 AM Medical Record Number: [id number] Patient Account Number: [id number] Date of Birth/Sex: 18-Oct-1961 (60 y.o. F) Treating RN: Huel Coventry Primary Care Jessie Schrieber: [name] Other Clinician: Referring Lea Baine: Card, John Treating Sherrel Ploch/Extender: Rowan Blase in Treatment: 2 Clinic Level of Care Assessment Items TOOL 4 Quantity Score []  - Use when only an EandM is performed on FOLLOW-UP visit 0 ASSESSMENTS - Nursing Assessment / Reassessment []  - Reassessment of Co-morbidities (includes updates in patient status) 0 []  - 0 Reassessment of Adherence to Treatment Plan ASSESSMENTS - Wound and Skin Assessment / Reassessment X - Simple Wound Assessment / Reassessment - one wound 1 5 []  -  0 Complex Wound Assessment / Reassessment - multiple wounds []  - 0 Dermatologic / Skin Assessment (not related to wound area) ASSESSMENTS - Focused Assessment []  - Circumferential Edema Measurements - multi extremities 0 []  - 0 Nutritional Assessment / Counseling / Intervention []  - 0 Lower Extremity Assessment (monofilament, tuning fork, pulses) []  - 0 Peripheral Arterial Disease Assessment (using hand held doppler) ASSESSMENTS - Ostomy and/or Continence Assessment and Care []  - Incontinence Assessment and Management 0 []  - 0 Ostomy Care Assessment and Management (repouching, etc.) PROCESS - Coordination of Care []  - Simple Patient / Family Education for ongoing care 0 []  - 0 Complex (extensive) Patient / Family Education for ongoing care []  - 0 Staff obtains , Records, Test Results / Process Orders []  - 0 Staff telephones HHA, Nursing Homes / Clarify orders / etc []  - 0 Routine Transfer to another Facility (non-emergent condition) []  - 0 Routine Hospital Admission (non-emergent condition) []  - 0 New Admissions / / Ordering NPWT, Apligraf, etc. []  - 0 Emergency Hospital Admission (emergent condition) []  - 0 Simple Discharge Coordination []  - 0 Complex (extensive) Discharge Coordination PROCESS - Special Needs []  - Pediatric / Minor Patient Management 0 []  - 0 Isolation Patient Management []  - 0 Hearing / Language / Visual special needs []  - 0 Assessment of Community assistance (transportation, D/C planning, etc.) []  - 0 Additional assistance / Altered mentation []  - 0 Support Surface(s) Assessment (bed, cushion, seat, etc.) INTERVENTIONS - Wound Cleansing / Measurement Delorenzo, Xiao C. ( ) X- 1 5 Simple Wound Cleansing - one wound []  - 0 Complex Wound Cleansing - multiple wounds X- 1 5 Wound Imaging (photographs - any number of wounds) []  - 0 Wound Tracing (instead of photographs) X- 1 5 Simple Wound Measurement -  one wound []  - 0 Complex Wound Measurement - multiple wounds INTERVENTIONS - Wound Dressings []  - Small Wound Dressing one or multiple wounds 0  X- 1 15 Medium Wound Dressing one or multiple wounds []  - 0 Large Wound Dressing one or multiple wounds []  - 0 Application of Medications - topical []  - 0 Application of Medications - injection INTERVENTIONS - Miscellaneous []  - External ear exam 0 []  - 0 Specimen Collection (cultures, biopsies, blood, body fluids, etc.) []  - 0 Specimen(s) / Culture(s) sent or taken to Lab for analysis []  - 0 Patient Transfer (multiple staff / / Similar devices) []  - 0 Simple Staple / Suture removal (25 or less) []  - 0 Complex Staple / Suture removal (26 or more) []  - 0 Hypo / Hyperglycemic Management (close monitor of Blood Glucose) []  - 0 Ankle / Brachial Index (ABI) - do not check if billed separately []  - 0 Vital Signs Has the patient been seen at the hospital within the last three years: Yes Total Score: 35 Level Of Care: New/Established - Level 1 Electronic Signature(s) Signed: 11/02/2021 12:18:06 PM By: , BSN, RN, CWS, Kim RN, BSN Entered By: , BSN, RN, CWS, Kim on 11/01/2021 12:34:29 Linhares, ( ) -------------------------------------------------------------------------------- Encounter Discharge Information Details Patient Name: Afzal, Aamani C. Date of Service: 11/01/2021 11:30 AM Medical Record Number: Patient Account Number: Date of Birth/Sex: [birth year] (60 y.o. F) Treating RN: Primary Care Sami Froh: 13/03/2021 Other Clinician: Referring Lorenzo Arscott: Card, John Treating Zohaib Heeney/Extender: Elliot Gurney in Treatment: 2 Encounter Discharge Information Items Discharge Condition: Stable Ambulatory Status: Walker Discharge Destination: Home Transportation: Private Auto Accompanied By: self Schedule Follow-up Appointment: Yes Clinical Summary of  Care: Electronic Signature(s) Signed: 11/01/2021 12:33:59 PM By: 13/02/2021, BSN, RN, CWS, Kim RN, BSN Entered By: Domingo Pulse, BSN, RN, CWS, Kim on 11/01/2021 12:33:59 Alberty, 13/02/2021 ([id number]) -------------------------------------------------------------------------------- Wound Assessment Details Patient Name: Debow, Elnita C. Date of Service: 11/01/2021 11:30 AM Medical Record Number: 09/11/1961 Patient Account Number: [id number] Date of Birth/Sex: Oct 03, 1961 (60 y.o. F) Treating RN: Rowan Blase Primary Care Kaden Daughdrill: 13/02/2021 Other Clinician: Referring Fletcher Rathbun: Card, John Treating Marley Charlot/Extender: Elliot Gurney in Treatment: 2 Wound Status Wound Number: 1 Primary Etiology: Dehisced Wound Wound Location: Distal, Midline Back Wound Status: Open Wounding Event: Surgical Injury Date Acquired: 04/11/2021 Weeks Of Treatment: 2 Clustered Wound: No Wound Measurements Length: (cm) 0.3 Width: (cm) 0.3 Depth: (cm) 13 Area: (cm) 0.071 Volume: (cm) 0.919 % Reduction in Area: 43.7% % Reduction in Volume: -59% Wound Description Classification: Full Thickness Without Exposed Support Structu Exudate Amount: Large Exudate Type: Serosanguineous Exudate Color: red, brown res Treatment Notes Wound #1 (Back) Wound Laterality: Midline, Distal Cleanser Normal Saline Discharge Instruction: Wash your hands with soap and water. Remove old dressing, discard into plastic bag and place into trash. Cleanse the wound with Normal Saline prior to applying a clean dressing using gauze sponges, not tissues or cotton balls. Do not scrub or use excessive force. Pat dry using gauze sponges, not tissue or cotton balls. Peri-Wound Care Topical Primary Dressing Hydrofera Blue Classic Foam Rope Dressing, 9x6 (mm/in) Secondary Dressing Zetuvit Plus Silicone Non-bordered 5x5 (in/in) Secured With Compression Wrap Compression Stockings 13/02/2021) Signed: 11/02/2021 12:18:06 PM  By: [id number], BSN, RN, CWS, Kim RN, BSN Entered By: 13/02/2021, BSN, RN, CWS, Kim on 11/01/2021 12:33:21

## 2021-11-02 NOTE — Progress Notes (Signed)
ALZORA, HA ([id number]) Visit Report for 10/30/2021 Arrival Information Details Patient Name: [name], [name] Date of Service: 10/30/2021 11:00 AM Medical Record Number: [id number] Patient Account Number: [id number] Date of Birth/Sex: 20-Nov-1961 (60 y.o. F) Treating RN: [name] Primary Care [name]: Card, [name] Other Clinician: Referring [name]: Card, [name] Treating Taelor Waymire/Extender: [name] in Treatment: 2 Visit Information History Since Last Visit All ordered tests and consults were completed: No Patient Arrived: [name] Added or deleted any medications: No Arrival Time: 11:30 Any new allergies or adverse reactions: No Accompanied By: self Had a fall or experienced change in No Transfer Assistance: None activities of daily living that may affect Patient Identification Verified: Yes risk of falls: Secondary Verification Process Completed: Yes Signs or symptoms of abuse/neglect since last visito No Patient Requires Transmission-Based Precautions: No Hospitalized since last visit: No Patient Has Alerts: No Implantable device outside of the clinic excluding No cellular tissue based products placed in the center since last visit: Has Dressing in Place as Prescribed: Yes Pain Present Now: No Electronic Signature(s) Signed: 11/01/2021 5:07:02 PM By: [name] Pax RN Entered By: [name] on 10/30/2021 11:35:46 [name] ([id number]) -------------------------------------------------------------------------------- Clinic Level of Care Assessment Details Patient Name: Jessop, Jewel [name]. Date of Service: 10/30/2021 11:00 AM Medical Record Number: [id number] Patient Account Number: [id number] Date of Birth/Sex: 1961-04-22 (60 y.o. F) Treating RN: [name] Primary Care Misao Fackrell: Card, [name] Other Clinician: Referring Mekaila Tarnow: Card, [name] Treating [name]/Extender: [name] in Treatment: 2 Clinic Level of Care Assessment Items TOOL 4  Quantity Score X - Use when only an EandM is performed on FOLLOW-UP visit 1 0 ASSESSMENTS - Nursing Assessment / Reassessment X - Reassessment of Co-morbidities (includes updates in patient status) 1 10 X- 1 5 Reassessment of Adherence to Treatment Plan ASSESSMENTS - Wound and Skin Assessment / Reassessment X - Simple Wound Assessment / Reassessment - one wound 1 5 []  - 0 Complex Wound Assessment / Reassessment - multiple wounds []  - 0 Dermatologic / Skin Assessment (not related to wound area) ASSESSMENTS - Focused Assessment []  - Circumferential Edema Measurements - multi extremities 0 []  - 0 Nutritional Assessment / Counseling / Intervention []  - 0 Lower Extremity Assessment (monofilament, tuning fork, pulses) []  - 0 Peripheral Arterial Disease Assessment (using hand held doppler) ASSESSMENTS - Ostomy and/or Continence Assessment and Care []  - Incontinence Assessment and Management 0 []  - 0 Ostomy Care Assessment and Management (repouching, etc.) PROCESS - Coordination of Care X - Simple Patient / Family Education for ongoing care 1 15 []  - 0 Complex (extensive) Patient / Family Education for ongoing care X- 1 10 Staff obtains , Records, Test Results / Process Orders []  - 0 Staff telephones HHA, Nursing Homes / Clarify orders / etc []  - 0 Routine Transfer to another Facility (non-emergent condition) []  - 0 Routine Hospital Admission (non-emergent condition) []  - 0 New Admissions / / Ordering NPWT, Apligraf, etc. []  - 0 Emergency Hospital Admission (emergent condition) X- 1 10 Simple Discharge Coordination []  - 0 Complex (extensive) Discharge Coordination PROCESS - Special Needs []  - Pediatric / Minor Patient Management 0 []  - 0 Isolation Patient Management []  - 0 Hearing / Language / Visual special needs []  - 0 Assessment of Community assistance (transportation, D/[name] planning, etc.) []  - 0 Additional assistance / Altered  mentation []  - 0 Support Surface(s) Assessment (bed, cushion, seat, etc.) INTERVENTIONS - Wound Cleansing / Measurement [name] ( ) X- 1 5 Simple Wound Cleansing -  one wound []  - 0 Complex Wound Cleansing - multiple wounds X- 1 5 Wound Imaging (photographs - any number of wounds) []  - 0 Wound Tracing (instead of photographs) X- 1 5 Simple Wound Measurement - one wound []  - 0 Complex Wound Measurement - multiple wounds INTERVENTIONS - Wound Dressings []  - Small Wound Dressing one or multiple wounds 0 X- 1 15 Medium Wound Dressing one or multiple wounds []  - 0 Large Wound Dressing one or multiple wounds []  - 0 Application of Medications - topical []  - 0 Application of Medications - injection INTERVENTIONS - Miscellaneous []  - External ear exam 0 []  - 0 Specimen Collection (cultures, biopsies, blood, body fluids, etc.) []  - 0 Specimen(s) / Culture(s) sent or taken to Lab for analysis []  - 0 Patient Transfer (multiple staff / / Similar devices) []  - 0 Simple Staple / Suture removal (25 or less) []  - 0 Complex Staple / Suture removal (26 or more) []  - 0 Hypo / Hyperglycemic Management (close monitor of Blood Glucose) []  - 0 Ankle / Brachial Index (ABI) - do not check if billed separately X- 1 5 Vital Signs Has the patient been seen at the hospital within the last three years: Yes Total Score: 90 Level Of Care: New/Established - Level 3 Electronic Signature(s) Signed: 11/01/2021 5:07:02 PM By: RN Entered By: on 10/30/2021 12:31:45 [name] ( ) -------------------------------------------------------------------------------- Encounter Discharge Information Details Patient Name: [name], [name] Date of Service: 10/30/2021 11:00 AM Medical Record Number: Patient Account Number: Date of Birth/Sex: 30-Jan-1961 (60 y.o. F) Treating RN: Primary Care Siearra Amberg:  Other Clinician: Referring Ivry Pigue: Card, [name] Treating Janelli Welling/Extender: in Treatment: 2 Encounter Discharge Information Items Discharge Condition: Stable Ambulatory Status: Walker Discharge Destination: Home Transportation: Private Auto Accompanied By: self Schedule Follow-up Appointment: Yes Clinical Summary of Care: Patient Declined Electronic Signature(s) Signed: 10/30/2021 12:32:59 PM By: 13/02/2021 RN Entered By: [name] on 10/30/2021 12:32:58 Hicks, Chester [name]. (13/12/2020) -------------------------------------------------------------------------------- Lower Extremity Assessment Details Patient Name: Hermann, Jamal [name]. Date of Service: 10/30/2021 11:00 AM Medical Record Number: [id number] Patient Account Number: 13/12/2020 Date of Birth/Sex: [birth year] (60 y.o. F) Treating RN: 09/11/1961 Primary Care Laray Rivkin: 68 Other Clinician: Referring Kitara Hebb: Card, [name] Treating Gerik Coberly/Extender: [name] Weeks in Treatment: 2 Electronic Signature(s) Signed: 10/30/2021 12:00:24 PM By: [name] Blase RN Entered By: 13/12/2020 on 10/30/2021 12:00:23 Merolla, Erianna CYevonne [name] (13/12/2020) -------------------------------------------------------------------------------- Multi Wound Chart Details Patient Name: Fairclough, Rosaly [name]. Date of Service: 10/30/2021 11:00 AM Medical Record Number: 13/12/2020 Patient Account Number: [id number] Date of Birth/Sex: [birth year] (60 y.o. F) Treating RN: 68 Primary Care Emmie Frakes: [name] Other Clinician: Referring Declynn Lopresti: Card, [name] Treating Ziggy Reveles/Extender: [name] in Treatment: 2 Vital Signs Height(in): 63 Pulse(bpm): 73 Weight(lbs): 275 Blood Pressure(mmHg): 177/74 Body Mass Index(BMI): 49 Temperature(F): 97.5 Respiratory Rate(breaths/min): 18 Photos: [N/A:N/A] Wound Location: Distal, Midline Back N/A N/A Wounding Event: Surgical Injury N/A N/A Primary Etiology:  Dehisced Wound N/A N/A Comorbid History: Hypertension N/A N/A Date Acquired: 04/11/2021 N/A N/A Weeks of Treatment: 2 N/A N/A Wound Status: Open N/A N/A Measurements L x W x D (cm) 0.3x0.3x13 N/A N/A Area (cm) : 0.071 N/A N/A Volume (cm) : 0.919 N/A N/A % Reduction in Area: 43.70% N/A N/A % Reduction in Volume: -59.00% N/A N/A Classification: Full Thickness Without Exposed N/A N/A Support Structures Exudate Amount: Large N/A N/A Exudate Type: Serosanguineous N/A N/A Exudate Color: red, brown N/A  N/A Granulation Amount: Large (67-100%) N/A N/A Granulation Quality: Red N/A N/A Necrotic Amount: None Present (0%) N/A N/A Exposed Structures: Fat Layer (Subcutaneous Tissue): N/A N/A Yes Fascia: No Tendon: No Muscle: No Joint: No Bone: No Epithelialization: None N/A N/A Treatment Notes Electronic Signature(s) Signed: 10/30/2021 12:00:50 PM By: [name] Pax RN Entered By: [name] on 10/30/2021 12:00:50 [name], [name] ([id number]) -------------------------------------------------------------------------------- Multi-Disciplinary Care Plan Details Patient Name: [name] Date of Service: 10/30/2021 11:00 AM Medical Record Number: [id number] Patient Account Number: [id number] Date of Birth/Sex: 09-09-1961 (60 y.o. F) Treating RN: [name] Primary Care Deanette Tullius: [name] Other Clinician: Referring Ruthvik Barnaby: Card, [name] Treating Arpita Fentress/Extender: [name] in Treatment: 2 Active Inactive Wound/Skin Impairment Nursing Diagnoses: Knowledge deficit related to ulceration/compromised skin integrity Goals: Patient/caregiver will verbalize understanding of skin care regimen Date Initiated: 10/15/2021 Target Resolution Date: 11/15/2021 Goal Status: Active Ulcer/skin breakdown will have a volume reduction of 30% by week 4 Date Initiated: 10/15/2021 Target Resolution Date: 12/15/2021 Goal Status: Active Ulcer/skin breakdown will have a volume reduction  of 50% by week 8 Date Initiated: 10/15/2021 Target Resolution Date: 01/15/2022 Goal Status: Active Ulcer/skin breakdown will have a volume reduction of 80% by week 12 Date Initiated: 10/15/2021 Target Resolution Date: 02/15/2022 Goal Status: Active Ulcer/skin breakdown will heal within 14 weeks Date Initiated: 10/15/2021 Target Resolution Date: 03/15/2022 Goal Status: Active Interventions: Assess patient/caregiver ability to obtain necessary supplies Assess patient/caregiver ability to perform ulcer/skin care regimen upon admission and as needed Assess ulceration(s) every visit Notes: Electronic Signature(s) Signed: 10/30/2021 12:00:35 PM By: [name] Pax RN Entered By: [name] on 10/30/2021 12:00:35 [name] ([id number]) -------------------------------------------------------------------------------- Pain Assessment Details Patient Name: [name] Date of Service: 10/30/2021 11:00 AM Medical Record Number: [id number] Patient Account Number: [id number] Date of Birth/Sex: [birth year] (60 y.o. F) Treating RN: [name] Primary Care Daymen Hassebrock: [name] Other Clinician: Referring Malaijah Houchen: Card, [name] Treating Shilah Hefel/Extender: [name] in Treatment: 2 Active Problems Location of Pain Severity and Description of Pain Patient Has Paino No Site Locations Pain Management and Medication Current Pain Management: Electronic Signature(s) Signed: 11/01/2021 5:07:02 PM By: [name] Pax RN Entered By: [name] on 10/30/2021 11:36:36 [name], [name] ([id number]) -------------------------------------------------------------------------------- Patient/Caregiver Education Details Patient Name: [name] Date of Service: 10/30/2021 11:00 AM Medical Record Number: [id number] Patient Account Number: [id number] Date of Birth/Gender: 1961/11/22 (60 y.o. F) Treating RN: [name] Primary Care Physician: [name] Other Clinician: Referring  Physician: Card, [name] Treating Physician/Extender: [name] in Treatment: 2 Education Assessment Education Provided To: Patient Education Topics Provided Wound/Skin Impairment: Methods: Explain/Verbal Responses: State content correctly Electronic Signature(s) Signed: 11/01/2021 5:07:02 PM By: [name] Pax RN Entered By: [name] on 10/30/2021 12:32:11 [name] ([id number]) -------------------------------------------------------------------------------- Wound Assessment Details Patient Name: Richman, Zylah [name]. Date of Service: 10/30/2021 11:00 AM Medical Record Number: [id number] Patient Account Number: [id number] Date of Birth/Sex: 1961/06/21 (60 y.o. F) Treating RN: [name] Primary Care Shakora Nordquist: Card, [name] Other Clinician: Referring Stokely Jeancharles: Card, [name] Treating Clarrissa Shimkus/Extender: [name] in Treatment: 2 Wound Status Wound Number: 1 Primary Etiology: Dehisced Wound Wound Location: Distal, Midline Back Wound Status: Open Wounding Event: Surgical Injury Comorbid History: Hypertension Date Acquired: 04/11/2021 Weeks Of Treatment: 2 Clustered Wound: No Photos Wound Measurements Length: (cm) 0.3 Width: (cm) 0.3 Depth: (cm) 13 Area: (cm) 0.071 Volume: (cm) 0.919 % Reduction in Area: 43.7% % Reduction in Volume: -59% Epithelialization: None Tunneling: No Undermining: No Wound Description Classification: Full Thickness Without Exposed Support  Structures Exudate Amount: Large Exudate Type: Serosanguineous Exudate Color: red, brown Foul Odor After Cleansing: No Slough/Fibrino No Wound Bed Granulation Amount: Large (67-100%) Exposed Structure Granulation Quality: Red Fascia Exposed: No Necrotic Amount: None Present (0%) Fat Layer (Subcutaneous Tissue) Exposed: Yes Tendon Exposed: No Muscle Exposed: No Joint Exposed: No Bone Exposed: No Treatment Notes Wound #1 (Back) Wound Laterality: Midline, Distal Cleanser Normal  Saline Discharge Instruction: Wash your hands with soap and water. Remove old dressing, discard into plastic bag and place into trash. Cleanse the wound with Normal Saline prior to applying a clean dressing using gauze sponges, not tissues or cotton balls. Do not scrub or use excessive force. Pat dry using gauze sponges, not tissue or cotton balls. Essner, Adhira [name]. ([id number]) Peri-Wound Care Topical Primary Dressing Hydrofera Blue Classic Foam Rope Dressing, 9x6 (mm/in) Secondary Dressing Zetuvit Plus Silicone Non-bordered 5x5 (in/in) Secured With Compression Wrap Compression Stockings Add-Ons Electronic Signature(s) Signed: 11/01/2021 5:07:02 PM By: [name] Pax RN Entered By: [name] on 10/30/2021 11:41:25 Brunty, Rasa [name]. ([id number]) -------------------------------------------------------------------------------- Vitals Details Patient Name: Eslick, Marlies [name]. Date of Service: 10/30/2021 11:00 AM Medical Record Number: [id number] Patient Account Number: [id number] Date of Birth/Sex: 1961/09/10 (60 y.o. F) Treating RN: [name] Primary Care Lexton Hidalgo: [name] Other Clinician: Referring Seichi Kaufhold: Card, [name] Treating Elimelech Houseman/Extender: [name] in Treatment: 2 Vital Signs Time Taken: 11:35 Temperature (F): 97.5 Height (in): 63 Pulse (bpm): 73 Weight (lbs): 275 Respiratory Rate (breaths/min): 18 Body Mass Index (BMI): 48.7 Blood Pressure (mmHg): 177/74 Reference Range: 80 - 120 mg / dl Electronic Signature(s) Signed: 11/01/2021 5:07:02 PM By: [name] Pax RN Entered By: [name] on 10/30/2021 11:36:08

## 2021-11-03 ENCOUNTER — Ambulatory Visit (HOSPITAL_BASED_OUTPATIENT_CLINIC_OR_DEPARTMENT_OTHER)
Admission: RE | Admit: 2021-11-03 | Discharge: 2021-11-03 | Disposition: A | Discharge status: Home / Self Care | Payer: 59 | Source: Ambulatory Visit | Attending: Neurosurgery | Admitting: Neurosurgery

## 2021-11-03 ENCOUNTER — Other Ambulatory Visit: Payer: Self-pay

## 2021-11-03 DIAGNOSIS — M96842 Postprocedural seroma of a musculoskeletal structure following a musculoskeletal system procedure: Secondary | ICD-10-CM | POA: Insufficient documentation

## 2021-11-03 MED ORDER — GADOBUTROL 1 MMOL/ML IV SOLN
10.0000 mL | Freq: Once | INTRAVENOUS | Status: AC | PRN
  Administered 2021-11-03: 10 mL via INTRAVENOUS

## 2021-11-06 ENCOUNTER — Other Ambulatory Visit: Payer: Self-pay

## 2021-11-06 ENCOUNTER — Encounter: Payer: 59 | Admitting: Physician Assistant

## 2021-11-06 DIAGNOSIS — T8131XA Disruption of external operation (surgical) wound, not elsewhere classified, initial encounter: Secondary | ICD-10-CM | POA: Diagnosis not present

## 2021-11-08 ENCOUNTER — Other Ambulatory Visit: Payer: Self-pay

## 2021-11-08 DIAGNOSIS — T8131XA Disruption of external operation (surgical) wound, not elsewhere classified, initial encounter: Secondary | ICD-10-CM | POA: Diagnosis not present

## 2021-11-08 NOTE — Progress Notes (Signed)
LANIJAH, WARZECHA ([id number]) Visit Report for 11/08/2021 Arrival Information Details Patient Name: Lukasiewicz, Barri C. Date of Service: 11/08/2021 11:30 AM Medical Record Number: [id number] Patient Account Number: [id number] Date of Birth/Sex: 18-Jan-1961 (60 y.o. F) Treating RN: Huel Coventry Primary Care Meilin Brosh: [name] Other Clinician: Referring Lindley Hiney: Card, John Treating Maelyn Berrey/Extender: Rowan Blase in Treatment: 3 Visit Information History Since Last Visit Has Dressing in Place as Prescribed: Yes Patient Arrived: Walker Pain Present Now: No Arrival Time: 11:51 Accompanied By: self Transfer Assistance: None Patient Identification Verified: Yes Secondary Verification Process Completed: Yes Patient Requires Transmission-Based Precautions: No Patient Has Alerts: No Electronic Signature(s) Signed: 11/08/2021 11:51:53 AM By: Elliot Gurney, BSN, RN, CWS, Kim RN, BSN Entered By: Elliot Gurney, BSN, RN, CWS, Kim on 11/08/2021 11:51:53 Farra, Domingo Pulse ([id number]) -------------------------------------------------------------------------------- Clinic Level of Care Assessment Details Patient Name: Dillie, Katlen C. Date of Service: 11/08/2021 11:30 AM Medical Record Number: [id number] Patient Account Number: [id number] Date of Birth/Sex: 1961/08/24 (60 y.o. F) Treating RN: Huel Coventry Primary Care Zuhair Lariccia: [name] Other Clinician: Referring Brigitta Pricer: Card, John Treating Charisma Charlot/Extender: Rowan Blase in Treatment: 3 Clinic Level of Care Assessment Items TOOL 4 Quantity Score []  - Use when only an EandM is performed on FOLLOW-UP visit 0 ASSESSMENTS - Nursing Assessment / Reassessment []  - Reassessment of Co-morbidities (includes updates in patient status) 0 []  - 0 Reassessment of Adherence to Treatment Plan ASSESSMENTS - Wound and Skin Assessment / Reassessment X - Simple Wound Assessment / Reassessment - one wound 1 5 []  - 0 Complex Wound Assessment / Reassessment -  multiple wounds []  - 0 Dermatologic / Skin Assessment (not related to wound area) ASSESSMENTS - Focused Assessment []  - Circumferential Edema Measurements - multi extremities 0 []  - 0 Nutritional Assessment / Counseling / Intervention []  - 0 Lower Extremity Assessment (monofilament, tuning fork, pulses) []  - 0 Peripheral Arterial Disease Assessment (using hand held doppler) ASSESSMENTS - Ostomy and/or Continence Assessment and Care []  - Incontinence Assessment and Management 0 []  - 0 Ostomy Care Assessment and Management (repouching, etc.) PROCESS - Coordination of Care []  - Simple Patient / Family Education for ongoing care 0 []  - 0 Complex (extensive) Patient / Family Education for ongoing care []  - 0 Staff obtains , Records, Test Results / Process Orders []  - 0 Staff telephones HHA, Nursing Homes / Clarify orders / etc []  - 0 Routine Transfer to another Facility (non-emergent condition) []  - 0 Routine Hospital Admission (non-emergent condition) []  - 0 New Admissions / / Ordering NPWT, Apligraf, etc. []  - 0 Emergency Hospital Admission (emergent condition) X- 1 10 Simple Discharge Coordination []  - 0 Complex (extensive) Discharge Coordination PROCESS - Special Needs []  - Pediatric / Minor Patient Management 0 []  - 0 Isolation Patient Management []  - 0 Hearing / Language / Visual special needs []  - 0 Assessment of Community assistance (transportation, D/C planning, etc.) []  - 0 Additional assistance / Altered mentation []  - 0 Support Surface(s) Assessment (bed, cushion, seat, etc.) INTERVENTIONS - Wound Cleansing / Measurement Karn, Britlee C. ( ) X- 1 5 Simple Wound Cleansing - one wound []  - 0 Complex Wound Cleansing - multiple wounds X- 1 5 Wound Imaging (photographs - any number of wounds) []  - 0 Wound Tracing (instead of photographs) X- 1 5 Simple Wound Measurement - one wound []  - 0 Complex Wound  Measurement - multiple wounds INTERVENTIONS - Wound Dressings []  - Small Wound Dressing one or multiple wounds 0 X- 1 15 Medium Wound Dressing  one or multiple wounds []  - 0 Large Wound Dressing one or multiple wounds []  - 0 Application of Medications - topical []  - 0 Application of Medications - injection INTERVENTIONS - Miscellaneous []  - External ear exam 0 []  - 0 Specimen Collection (cultures, biopsies, blood, body fluids, etc.) []  - 0 Specimen(s) / Culture(s) sent or taken to Lab for analysis []  - 0 Patient Transfer (multiple staff / / Similar devices) []  - 0 Simple Staple / Suture removal (25 or less) []  - 0 Complex Staple / Suture removal (26 or more) []  - 0 Hypo / Hyperglycemic Management (close monitor of Blood Glucose) []  - 0 Ankle / Brachial Index (ABI) - do not check if billed separately []  - 0 Vital Signs Has the patient been seen at the hospital within the last three years: Yes Total Score: 45 Level Of Care: New/Established - Level 2 Electronic Signature(s) Unsigned Entered By: , BSN, RN, CWS, Kim on 11/08/2021 11:53:45 Signature(s): Date(s): Legan, Lelah ( ) -------------------------------------------------------------------------------- Encounter Discharge Information Details Patient Name: Mclaren, Nadalyn C. Date of Service: 11/08/2021 11:30 AM Medical Record Number: Patient Account Number: Nurse, adult Date of Birth/Sex: 04-26-1961 (60 y.o. F) Treating RN: Primary Care Antavia Tandy: Other Clinician: Referring Matison Nuccio: Card, John Treating Samah Lapiana/Extender: in Treatment: 3 Encounter Discharge Information Items Discharge Condition: Stable Ambulatory Status: Ambulatory Discharge Destination: Home Transportation: Private Auto Accompanied By: self Schedule Follow-up Appointment: Yes Clinical Summary of Care: Electronic Signature(s) Signed: 11/08/2021 11:53:18 AM By: 13/09/2021,  BSN, RN, CWS, Kim RN, BSN Entered By: Salena Saner, BSN, RN, CWS, Kim on 11/08/2021 11:53:18 Corlew, 13/09/2021 ([id number]) -------------------------------------------------------------------------------- Wound Assessment Details Patient Name: Daughdrill, Darriel C. Date of Service: 11/08/2021 11:30 AM Medical Record Number: 09/11/1961 Patient Account Number: [id number] Date of Birth/Sex: April 08, 1961 (60 y.o. F) Treating RN: Rowan Blase Primary Care Damyia Strider: 13/09/2021 Other Clinician: Referring Ann-Marie Kluge: Card, John Treating Devera Englander/Extender: Elliot Gurney in Treatment: 3 Wound Status Wound Number: 1 Primary Etiology: Dehisced Wound Wound Location: Distal, Midline Back Wound Status: Open Wounding Event: Surgical Injury Comorbid History: Hypertension Date Acquired: 04/11/2021 Weeks Of Treatment: 3 Clustered Wound: No Wound Measurements Length: (cm) 0.3 Width: (cm) 0.3 Depth: (cm) 13 Area: (cm) 0.071 Volume: (cm) 0.919 % Reduction in Area: 43.7% % Reduction in Volume: -59% Epithelialization: None Tunneling: No Undermining: No Wound Description Classification: Full Thickness Without Exposed Support Structu Exudate Amount: Large Exudate Type: Serosanguineous Exudate Color: red, brown res Wound Bed Granulation Amount: Large (67-100%) Exposed Structure Granulation Quality: Red Fascia Exposed: No Fat Layer (Subcutaneous Tissue) Exposed: Yes Tendon Exposed: No Muscle Exposed: No Joint Exposed: No Bone Exposed: No Treatment Notes Wound #1 (Back) Wound Laterality: Midline, Distal Cleanser Normal Saline Discharge Instruction: Wash your hands with soap and water. Remove old dressing, discard into plastic bag and place into trash. Cleanse the wound with Normal Saline prior to applying a clean dressing using gauze sponges, not tissues or cotton balls. Do not scrub or use excessive force. Pat dry using gauze sponges, not tissue or cotton balls. Peri-Wound Care Topical Primary  Dressing Hydrofera Blue Classic Foam Rope Dressing, 9x6 (mm/in) Secondary Dressing Zetuvit Plus Silicone Non-bordered 5x5 (in/in) Secured With Compression Wrap Compression Stockings Add-Ons TRUST, CRAGO (Domingo Pulse) Electronic Signature(s) Signed: 11/08/2021 11:52:16 AM By: 13/09/2021, BSN, RN, CWS, Kim RN, BSN Entered By: [id number], BSN, RN, CWS, Kim on 11/08/2021 11:52:16

## 2021-11-09 NOTE — Progress Notes (Signed)
TARAE, WOODEN ([id number]) Visit Report for 11/06/2021 Arrival Information Details Patient Name: [name], [name] Date of Service: 11/06/2021 11:00 AM Medical Record Number: [id number] Patient Account Number: [id number] Date of Birth/Sex: [birth year] (60 y.o. F) Treating RN: Yevonne Pax Primary Care Ja Pistole: [name] Other Clinician: Referring Geordie Nooney: [name] Treating Neveen Daponte/Extender: [name] in Treatment: 3 Visit Information History Since Last Visit All ordered tests and consults were completed: No Patient Arrived: Ambulatory Added or deleted any medications: No Arrival Time: 11:11 Any new allergies or adverse reactions: No Accompanied By: self Had a fall or experienced change in No Transfer Assistance: None activities of daily living that may affect Patient Identification Verified: Yes risk of falls: Secondary Verification Process Completed: Yes Signs or symptoms of abuse/neglect since last visito No Patient Requires Transmission-Based Precautions: No Hospitalized since last visit: No Patient Has Alerts: No Implantable device outside of the clinic excluding No cellular tissue based products placed in the center since last visit: Has Dressing in Place as Prescribed: Yes Pain Present Now: No Electronic Signature(s) Signed: 11/09/2021 3:56:47 PM By: Yevonne Pax RN Entered By: Yevonne Pax on 11/06/2021 11:15:50 [name], [name] ([id number]) -------------------------------------------------------------------------------- Clinic Level of Care Assessment Details Patient Name: [name], [name] Date of Service: 11/06/2021 11:00 AM Medical Record Number: [id number] Patient Account Number: [id number] Date of Birth/Sex: Apr 30, 1961 (60 y.o. F) Treating RN: Yevonne Pax Primary Care Jaydeen Odor: [name] Other Clinician: Referring Marcellene Shivley: [name] Treating Mahogany Torrance/Extender: [name] in Treatment: 3 Clinic Level of Care Assessment  Items TOOL 4 Quantity Score X - Use when only an EandM is performed on FOLLOW-UP visit 1 0 ASSESSMENTS - Nursing Assessment / Reassessment X - Reassessment of Co-morbidities (includes updates in patient status) 1 10 X- 1 5 Reassessment of Adherence to Treatment Plan ASSESSMENTS - Wound and Skin Assessment / Reassessment X - Simple Wound Assessment / Reassessment - one wound 1 5 []  - 0 Complex Wound Assessment / Reassessment - multiple wounds []  - 0 Dermatologic / Skin Assessment (not related to wound area) ASSESSMENTS - Focused Assessment []  - Circumferential Edema Measurements - multi extremities 0 []  - 0 Nutritional Assessment / Counseling / Intervention []  - 0 Lower Extremity Assessment (monofilament, tuning fork, pulses) []  - 0 Peripheral Arterial Disease Assessment (using hand held doppler) ASSESSMENTS - Ostomy and/or Continence Assessment and Care []  - Incontinence Assessment and Management 0 []  - 0 Ostomy Care Assessment and Management (repouching, etc.) PROCESS - Coordination of Care X - Simple Patient / Family Education for ongoing care 1 15 []  - 0 Complex (extensive) Patient / Family Education for ongoing care []  - 0 Staff obtains , Records, Test Results / Process Orders []  - 0 Staff telephones HHA, Nursing Homes / Clarify orders / etc []  - 0 Routine Transfer to another Facility (non-emergent condition) []  - 0 Routine Hospital Admission (non-emergent condition) []  - 0 New Admissions / / Ordering NPWT, Apligraf, etc. []  - 0 Emergency Hospital Admission (emergent condition) X- 1 10 Simple Discharge Coordination []  - 0 Complex (extensive) Discharge Coordination PROCESS - Special Needs []  - Pediatric / Minor Patient Management 0 []  - 0 Isolation Patient Management []  - 0 Hearing / Language / Visual special needs []  - 0 Assessment of Community assistance (transportation, D/C planning, etc.) []  - 0 Additional assistance /  Altered mentation []  - 0 Support Surface(s) Assessment (bed, cushion, seat, etc.) INTERVENTIONS - Wound Cleansing / Measurement [name], [name] ( ) X- 1 5 Simple Wound Cleansing -  one wound []  - 0 Complex Wound Cleansing - multiple wounds X- 1 5 Wound Imaging (photographs - any number of wounds) []  - 0 Wound Tracing (instead of photographs) X- 1 5 Simple Wound Measurement - one wound []  - 0 Complex Wound Measurement - multiple wounds INTERVENTIONS - Wound Dressings X - Small Wound Dressing one or multiple wounds 1 10 []  - 0 Medium Wound Dressing one or multiple wounds []  - 0 Large Wound Dressing one or multiple wounds X- 1 5 Application of Medications - topical []  - 0 Application of Medications - injection INTERVENTIONS - Miscellaneous []  - External ear exam 0 []  - 0 Specimen Collection (cultures, biopsies, blood, body fluids, etc.) []  - 0 Specimen(s) / Culture(s) sent or taken to Lab for analysis []  - 0 Patient Transfer (multiple staff / Nurse, adult / Similar devices) []  - 0 Simple Staple / Suture removal (25 or less) []  - 0 Complex Staple / Suture removal (26 or more) []  - 0 Hypo / Hyperglycemic Management (close monitor of Blood Glucose) []  - 0 Ankle / Brachial Index (ABI) - do not check if billed separately X- 1 5 Vital Signs Has the patient been seen at the hospital within the last three years: Yes Total Score: 80 Level Of Care: New/Established - Level 3 Electronic Signature(s) Signed: 11/09/2021 3:56:47 PM By: Yevonne Pax RN Entered By: Yevonne Pax on 11/06/2021 13:00:44 [name], [name] ([id number]) -------------------------------------------------------------------------------- Encounter Discharge Information Details Patient Name: [name], [name] Date of Service: 11/06/2021 11:00 AM Medical Record Number: [id number] Patient Account Number: [id number] Date of Birth/Sex: 20-Nov-1961 (60 y.o. F) Treating RN: Yevonne Pax Primary Care  Lasharn Bufkin: [name] Other Clinician: Referring Francys Bolin: [name] Treating Azael Ragain/Extender: [name] in Treatment: 3 Encounter Discharge Information Items Discharge Condition: Stable Ambulatory Status: Ambulatory Discharge Destination: Home Transportation: Private Auto Accompanied By: self Schedule Follow-up Appointment: Yes Clinical Summary of Care: Patient Declined Electronic Signature(s) Signed: 11/06/2021 1:01:47 PM By: Yevonne Pax RN Entered By: Yevonne Pax on 11/06/2021 13:01:47 [name], [name] ([id number]) -------------------------------------------------------------------------------- Lower Extremity Assessment Details Patient Name: [name], [name] Date of Service: 11/06/2021 11:00 AM Medical Record Number: [id number] Patient Account Number: [id number] Date of Birth/Sex: 08-15-1961 (60 y.o. F) Treating RN: Yevonne Pax Primary Care Yer Castello: [name] Other Clinician: Referring Leny Morozov: [name] Treating Zainah Steven/Extender: [name] in Treatment: 3 Electronic Signature(s) Signed: 11/09/2021 3:56:47 PM By: Yevonne Pax RN Entered By: Yevonne Pax on 11/06/2021 11:57:40 Ruberg, Camryn CMarland [name] ([id number]) -------------------------------------------------------------------------------- Multi Wound Chart Details Patient Name: Chipley, Celena C. Date of Service: 11/06/2021 11:00 AM Medical Record Number: [id number] Patient Account Number: [id number] Date of Birth/Sex: January 13, 1961 (60 y.o. F) Treating RN: Yevonne Pax Primary Care Harvie Morua: [name] Other Clinician: Referring Mikah Rottinghaus: [name] Treating Katessa Attridge/Extender: [name] in Treatment: 3 Vital Signs Height(in): 63 Pulse(bpm): 83 Weight(lbs): 275 Blood Pressure(mmHg): 168/93 Body Mass Index(BMI): 49 Temperature(F): 98.3 Respiratory Rate(breaths/min): 18 Photos: [N/A:N/A] Wound Location: Distal, Midline Back N/A N/A Wounding Event: Surgical Injury N/A N/A Primary  Etiology: Dehisced Wound N/A N/A Comorbid History: Hypertension N/A N/A Date Acquired: 04/11/2021 N/A N/A Weeks of Treatment: 3 N/A N/A Wound Status: Open N/A N/A Measurements L x W x D (cm) 0.3x0.3x13 N/A N/A Area (cm) : 0.071 N/A N/A Volume (cm) : 0.919 N/A N/A % Reduction in Area: 43.70% N/A N/A % Reduction in Volume: -59.00% N/A N/A Classification: Full Thickness Without Exposed N/A N/A Support Structures Exudate Amount: Large N/A N/A Exudate Type: Serosanguineous N/A N/A Exudate Color: red, brown  N/A N/A Granulation Amount: Large (67-100%) N/A N/A Granulation Quality: Red N/A N/A Exposed Structures: Fat Layer (Subcutaneous Tissue): N/A N/A Yes Fascia: No Tendon: No Muscle: No Joint: No Bone: No Epithelialization: None N/A N/A Treatment Notes Electronic Signature(s) Signed: 11/09/2021 3:56:47 PM By: Yevonne Pax RN Entered By: Yevonne Pax on 11/06/2021 11:58:33 [name], [name] ([id number]) -------------------------------------------------------------------------------- Multi-Disciplinary Care Plan Details Patient Name: [name], [name] Date of Service: 11/06/2021 11:00 AM Medical Record Number: [id number] Patient Account Number: [id number] Date of Birth/Sex: 11-16-1961 (60 y.o. F) Treating RN: Yevonne Pax Primary Care Mancil Pfenning: [name] Other Clinician: Referring Tyrease Vandeberg: [name] Treating Myeesha Shane/Extender: [name] in Treatment: 3 Active Inactive Wound/Skin Impairment Nursing Diagnoses: Knowledge deficit related to ulceration/compromised skin integrity Goals: Patient/caregiver will verbalize understanding of skin care regimen Date Initiated: 10/15/2021 Target Resolution Date: 11/15/2021 Goal Status: Active Ulcer/skin breakdown will have a volume reduction of 30% by week 4 Date Initiated: 10/15/2021 Target Resolution Date: 12/15/2021 Goal Status: Active Ulcer/skin breakdown will have a volume reduction of 50% by week 8 Date Initiated:  10/15/2021 Target Resolution Date: 01/15/2022 Goal Status: Active Ulcer/skin breakdown will have a volume reduction of 80% by week 12 Date Initiated: 10/15/2021 Target Resolution Date: 02/15/2022 Goal Status: Active Ulcer/skin breakdown will heal within 14 weeks Date Initiated: 10/15/2021 Target Resolution Date: 03/15/2022 Goal Status: Active Interventions: Assess patient/caregiver ability to obtain necessary supplies Assess patient/caregiver ability to perform ulcer/skin care regimen upon admission and as needed Assess ulceration(s) every visit Notes: Electronic Signature(s) Signed: 11/09/2021 3:56:47 PM By: Yevonne Pax RN Entered By: Yevonne Pax on 11/06/2021 11:57:52 [name], [name] ([id number]) -------------------------------------------------------------------------------- Pain Assessment Details Patient Name: [name], [name] Date of Service: 11/06/2021 11:00 AM Medical Record Number: [id number] Patient Account Number: [id number] Date of Birth/Sex: [birth year] (60 y.o. F) Treating RN: Yevonne Pax Primary Care Verdell Kincannon: [name] Other Clinician: Referring Winola Drum: [name] Treating Bridgitt Raggio/Extender: [name] in Treatment: 3 Active Problems Location of Pain Severity and Description of Pain Patient Has Paino No Site Locations Pain Management and Medication Current Pain Management: Electronic Signature(s) Signed: 11/09/2021 3:56:47 PM By: Yevonne Pax RN Entered By: Yevonne Pax on 11/06/2021 11:16:23 [name], [name] ([id number]) -------------------------------------------------------------------------------- Patient/Caregiver Education Details Patient Name: [name], [name] Date of Service: 11/06/2021 11:00 AM Medical Record Number: [id number] Patient Account Number: [id number] Date of Birth/Gender: July 17, 1961 (60 y.o. F) Treating RN: Yevonne Pax Primary Care Physician: [name] Other Clinician: Referring Physician: Card, [name] Treating  Physician/Extender: [name] in Treatment: 3 Education Assessment Education Provided To: Patient Education Topics Provided Wound/Skin Impairment: Methods: Explain/Verbal Responses: State content correctly Electronic Signature(s) Signed: 11/09/2021 3:56:47 PM By: Yevonne Pax RN Entered By: Yevonne Pax on 11/06/2021 13:01:07 [name], Izora C. ([id number]) -------------------------------------------------------------------------------- Wound Assessment Details Patient Name: Counts, Shelah C. Date of Service: 11/06/2021 11:00 AM Medical Record Number: [id number] Patient Account Number: [id number] Date of Birth/Sex: July 07, 1961 (60 y.o. F) Treating RN: Yevonne Pax Primary Care Kennadie Brenner: [name] Other Clinician: Referring Jorma Tassinari: [name] Treating Sunday Klos/Extender: [name] in Treatment: 3 Wound Status Wound Number: 1 Primary Etiology: Dehisced Wound Wound Location: Distal, Midline Back Wound Status: Open Wounding Event: Surgical Injury Comorbid History: Hypertension Date Acquired: 04/11/2021 Weeks Of Treatment: 3 Clustered Wound: No Photos Wound Measurements Length: (cm) 0.3 Width: (cm) 0.3 Depth: (cm) 13 Area: (cm) 0.071 Volume: (cm) 0.919 % Reduction in Area: 43.7% % Reduction in Volume: -59% Epithelialization: None Tunneling: No Undermining: No Wound Description Classification: Full Thickness Without Exposed Support Structu Exudate Amount: Large Exudate Type:  Serosanguineous Exudate Color: red, brown res Wound Bed Granulation Amount: Large (67-100%) Exposed Structure Granulation Quality: Red Fascia Exposed: No Fat Layer (Subcutaneous Tissue) Exposed: Yes Tendon Exposed: No Muscle Exposed: No Joint Exposed: No Bone Exposed: No Electronic Signature(s) Signed: 11/09/2021 3:56:47 PM By: Yevonne Pax RN Entered By: Yevonne Pax on 11/06/2021 11:26:29 Schanz, Roxene CMarland [name]  ([id number]) -------------------------------------------------------------------------------- Vitals Details Patient Name: Haseley, Donika C. Date of Service: 11/06/2021 11:00 AM Medical Record Number: [id number] Patient Account Number: [id number] Date of Birth/Sex: 1961-02-17 (60 y.o. F) Treating RN: Yevonne Pax Primary Care Chalon Zobrist: [name] Other Clinician: Referring Aijalon Kirtz: [name] Treating Marx Doig/Extender: [name] in Treatment: 3 Vital Signs Time Taken: 11:15 Temperature (F): 98.3 Height (in): 63 Pulse (bpm): 83 Weight (lbs): 275 Respiratory Rate (breaths/min): 18 Body Mass Index (BMI): 48.7 Blood Pressure (mmHg): 168/93 Reference Range: 80 - 120 mg / dl Electronic Signature(s) Signed: 11/09/2021 3:56:47 PM By: Yevonne Pax RN Entered By: Yevonne Pax on 11/06/2021 11:16:12

## 2021-11-09 NOTE — Progress Notes (Signed)
JASMEEN, GEISS ([id number]) Visit Report for 11/06/2021 Chief Complaint Document Details Patient Name: [name], [name] Date of Service: 11/06/2021 11:00 AM Medical Record Number: [id number] Patient Account Number: [id number] Date of Birth/Sex: Sep 14, 1961 (60 y.o. F) Treating RN: Yevonne Pax Primary Care Provider: [name] Other Clinician: Referring Provider: Card, John Treating Provider/Extender: Rowan Blase in Treatment: 3 Information Obtained from: Patient Chief Complaint Surgical Back Ulcer Electronic Signature(s) Signed: 11/06/2021 2:47:23 PM By: [name] PA-C Entered By: [name] on 11/06/2021 14:47:23 [name], [name] ([id number]) -------------------------------------------------------------------------------- HPI Details Patient Name: Zanetti, Deniya C. Date of Service: 11/06/2021 11:00 AM Medical Record Number: [id number] Patient Account Number: [id number] Date of Birth/Sex: [birth year] (60 y.o. F) Treating RN: Yevonne Pax Primary Care Provider: [name] Other Clinician: Referring Provider: Card, John Treating Provider/Extender: Rowan Blase in Treatment: 3 History of Present Illness HPI Description: 10/15/2021 upon evaluation today patient presents for initial evaluation here in the clinic concerning a surgical ulceration/dehiscence in the lumbar spine region following surgery that she had over the past year. This was actually broken up into 3 separate surgical events. The initial surgical intervention actually was on November 05, 2021 almost a year ago. Subsequently the patient went back in February for a seroma of the area which unfortunately required her to have a repeat surgery to go in and clean this out. And then again this occurred in April where she went back in and again they felt like stitches were coming out and there was an additional seroma. She was placed in a wound VAC initially and then subsequently as it got smaller that  was discontinued. Again right now I will see anything that I think a wound VAC would help with. Nonetheless she definitely has a significant depth to the wound that is going require packing. I actually believe the Hydrofera Blue rope would probably do quite well with this the problem is as much as it is draining she probably needs this to be changed at least every day. She does not really have anyone that can help with that that is the complicating scenario here. With that being said the patient does have a history of multiple sclerosis, hypertension, and again this surgical wound dehiscence in regard to her lumbar spine region. She did have a repeat MRI which was actually completed 10/09/2021. This showed that there was no significant change in the subcutaneous fluid collection/track of the lower lumbar region. This is extending to the level of the fascia unfortunately. This seems to go all the way from the L2 level with a track extending all the way to the fascia at the L4-5 level. Again this is a significant wound and there is significant drainage but does not seem to communicate to the spinal region as far as spinal fluid or otherwise is concerned that is good news. Nonetheless she last saw Dr. Franky Macho who is her neurosurgeon on 10/01/2021 that was when he ordered this last MRI she supposed to see him next week as well. With that being said he did not feel like there was any significant issue there but was not sure why this was not healing that is when he ordered the MRI. They were wanting to make sure that this was packed appropriately by home health unfortunately the main issue currently is that home health is completely out of the picture as the patient has exhausted all the home health that that she gets for a year. She is now in a very difficult  predicament where she does not have anyone to help her change the dressing and to be honest that she is not able to do it herself with the location of  the wound being on the midline lumbar spine region. If she does not have anyone that can help it is probably can to be necessary for her to go to a facility for rehab and daily dressing changes as I feel like daily changes which is much drainage that she is having is going to be necessary. 10/23/2021 upon evaluation today patient appears to be doing decently well in regard to her back ulcer. This does seem to be draining a lot less than what it is been doing in the past. With that being said she still has quite a bit of drainage nonetheless. I do think that given time this should improve least I hope so. The good news is she does have home health coming out 3 days a week were doing it 2 days a week and she is paying someone we can to help. 10/30/2021 upon evaluation today patient appears to be doing okay in regard to her back ulcer this is not draining quite as bad as it was in the beginning but he still has quite a bit of drainage noted. I do believe that the patient would benefit from [place] going ahead forward with attempting a wound VAC using the Hydrofera Blue rope to pack with and then subsequently using the VAC externally to actually suction out and help this to fill- in. I think this is our ideal way to try to get things cleared at this point. As it stands I am not certain that we are really making a progress that we want to see near with doing it in the way we are which is packing with the rope. It is a good dressing but I do think it is insufficient for total healing. She just seems to have too much in the way of drainage at this point unfortunately. 11/06/2021 upon evaluation today patient unfortunately continues to have issues with her back ongoing. The good news is her MRI that was repeated showed signs of the size of this area in the lumbar spine region having decreased from 4 cm to 3.5 cm this is definitely not bad news at all. With that being said unfortunately she continues to have issues  with ongoing drainage not as severe as in the beginning but nonetheless still significant. I do think a wound VAC still would be a good way to go although her home health agency nurse apparently has some concerns about the possibility of not being able to keep a seal with this as they had struggles in the past. Nonetheless I explained to the patient that this is much different than what she had previous and that I really feel like it would do much better as far as getting the area taken care of without having any complications or issues here. I think that we should be able to maintain a seal. Nonetheless at this time I did discuss with the patient as well that she probably does need to have a wound VAC in order for [place] to get this moving in the right direction. Electronic Signature(s) Signed: 11/06/2021 2:47:32 PM By: [name] PA-C Entered By: [name] on 11/06/2021 14:47:32 [name], [name] ([id number]) -------------------------------------------------------------------------------- Physical Exam Details Patient Name: Piet, Nuriya C. Date of Service: 11/06/2021 11:00 AM Medical Record Number: [id number] Patient Account Number: [id number] Date of  Birth/Sex: 1961-01-12 (60 y.o. F) Treating RN: Yevonne Pax Primary Care Provider: [name] Other Clinician: Referring Provider: Card, John Treating Provider/Extender: Rowan Blase in Treatment: 3 Constitutional Well-nourished and well-hydrated in no acute distress. Respiratory normal breathing without difficulty. Psychiatric this patient is able to make decisions and demonstrates good insight into disease process. Alert and Oriented x 3. pleasant and cooperative. Notes Upon inspection patient's wound bed actually showed signs of still being very tight I think that if organ to do the wound VAC we would need to either use silver nitrate or possibly even numbed the area and use a scalpel to slightly open the wound opening a  little bit more so that we can fit the Hydrofera Blue down into the tunnel region. This will be better than not being able to get it then or having too small of a piece there were trying to get him. This was discussed with the patient today as well. Electronic Signature(s) Signed: 11/06/2021 2:48:06 PM By: [name] PA-C Entered By: [name] on 11/06/2021 14:48:06 [name], [name] ([id number]) -------------------------------------------------------------------------------- Physician Orders Details Patient Name: Cuttino, Fredi C. Date of Service: 11/06/2021 11:00 AM Medical Record Number: [id number] Patient Account Number: [id number] Date of Birth/Sex: [birth year] (59 y.o. F) Treating RN: Yevonne Pax Primary Care Provider: [name] Other Clinician: Referring Provider: Card, John Treating Provider/Extender: Rowan Blase in Treatment: 3 Verbal / Phone Orders: No Diagnosis Coding Follow-up Appointments o Return Appointment in 1 week. o Nurse Visit as needed - nurse visit on thursday Home Health o [facility] for wound care. May utilize formulary equivalent dressing for wound treatment orders unless otherwise specified. Home Health Nurse may visit PRN to address patientos wound care needs. - 3 times per week [facility] fax [phone] Bathing/ Shower/ Hygiene o May shower; gently cleanse wound with antibacterial soap, rinse and pat dry prior to dressing wounds Edema Control - Lymphedema / Segmental Compressive Device / Other o Elevate, Exercise Daily and Avoid Standing for Long Periods of Time. o Elevate legs to the level of the heart and pump ankles as often as possible o Elevate leg(s) parallel to the floor when sitting. Wound Treatment Wound #1 - Back Wound Laterality: Midline, Distal Cleanser: Normal Saline 1 x Per Day/30 Days Discharge Instructions: Wash your hands with soap and water. Remove old dressing, discard into plastic bag and place  into trash. Cleanse the wound with Normal Saline prior to applying a clean dressing using gauze sponges, not tissues or cotton balls. Do not scrub or use excessive force. Pat dry using gauze sponges, not tissue or cotton balls. Primary Dressing: Hydrofera Blue Classic Foam Rope Dressing, 9x6 (mm/in) 1 x Per Day/30 Days Secondary Dressing: Zetuvit Plus Silicone Non-bordered 5x5 (in/in) 1 x Per Day/30 Days Electronic Signature(s) Signed: 11/06/2021 6:00:47 PM By: [name] PA-C Signed: 11/09/2021 3:56:47 PM By: Yevonne Pax RN Entered By: Yevonne Pax on 11/06/2021 11:59:01 [name], [name] ([id number]) -------------------------------------------------------------------------------- Problem List Details Patient Name: [name], [name] Date of Service: 11/06/2021 11:00 AM Medical Record Number: [id number] Patient Account Number: [id number] Date of Birth/Sex: [birth year] (60 y.o. 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 ([id number]) -------------------------------------------------------------------------------- Progress Note Details Patient Name: [name], [name] Date of Service: 11/06/2021 11:00 AM Medical Record Number: [id number] Patient Account Number: [id number] Date of Birth/Sex:  August 30, 1961 (60 y.o. F) Treating RN: Yevonne Pax Primary Care Provider: [name] Other Clinician: Referring Provider: Card, John Treating Provider/Extender: Rowan Blase in Treatment: 3 Subjective Chief Complaint Information obtained from Patient Surgical Back Ulcer History of Present Illness (HPI) 10/15/2021 upon evaluation today patient presents for initial evaluation here in the clinic concerning a surgical ulceration/dehiscence in the lumbar spine region following surgery that she had over the past year. This was actually broken up into 3 separate surgical events. The initial surgical intervention actually was on November 05, 2021 almost a year ago. Subsequently the patient went back in February for a seroma of the area which unfortunately required her to have a repeat surgery to go in and clean this out. And then again this occurred in April where she went back in and again they felt like stitches were coming out and there was an additional seroma. She was placed in a wound VAC initially and then subsequently as it got smaller that was discontinued. Again right now I will see anything that I think a wound VAC would help with. Nonetheless she definitely has a significant depth to the wound that is going require packing. I actually believe the Hydrofera Blue rope would probably do quite well with this the problem is as much as it is draining she probably needs this to be changed at least every day. She does not really have anyone that can help with that that is the complicating scenario here. With that being said the patient does have a history of multiple sclerosis, hypertension, and again this surgical wound dehiscence in regard to her lumbar spine region. She did have a repeat MRI which was actually completed 10/09/2021. This showed that there was no significant change in the subcutaneous fluid collection/track of the lower lumbar region. This is extending to the level of the fascia  unfortunately. This seems to go all the way from the L2 level with a track extending all the way to the fascia at the L4-5 level. Again this is a significant wound and there is significant drainage but does not seem to communicate to the spinal region as far as spinal fluid or otherwise is concerned that is good news. Nonetheless she last saw Dr. Franky Macho who is her neurosurgeon on 10/01/2021 that was when he ordered this last MRI she supposed to see him next week as well. With that being said he did not feel like there was any significant issue there but was not sure why this was not healing that is when he ordered the MRI. They were wanting to make sure that this was packed appropriately by home health unfortunately the main issue currently is that home health is completely out of the picture as the patient has exhausted all the home health that that she gets for a year. She is now in a very difficult predicament where she does not have anyone to help her change the dressing and to be honest that she is not able to do it herself with the location of the wound being on the midline lumbar spine region. If she does not have  anyone that can help it is probably can to be necessary for her to go to a facility for rehab and daily dressing changes as I feel like daily changes which is much drainage that she is having is going to be necessary. 10/23/2021 upon evaluation today patient appears to be doing decently well in regard to her back ulcer. This does seem to be draining a lot less than what it is been doing in the past. With that being said she still has quite a bit of drainage nonetheless. I do think that given time this should improve least I hope so. The good news is she does have home health coming out 3 days a week were doing it 2 days a week and she is paying someone we can to help. 10/30/2021 upon evaluation today patient appears to be doing okay in regard to her back ulcer this is not draining quite  as bad as it was in the beginning but he still has quite a bit of drainage noted. I do believe that the patient would benefit from [place] going ahead forward with attempting a wound VAC using the Hydrofera Blue rope to pack with and then subsequently using the VAC externally to actually suction out and help this to fill- in. I think this is our ideal way to try to get things cleared at this point. As it stands I am not certain that we are really making a progress that we want to see near with doing it in the way we are which is packing with the rope. It is a good dressing but I do think it is insufficient for total healing. She just seems to have too much in the way of drainage at this point unfortunately. 11/06/2021 upon evaluation today patient unfortunately continues to have issues with her back ongoing. The good news is her MRI that was repeated showed signs of the size of this area in the lumbar spine region having decreased from 4 cm to 3.5 cm this is definitely not bad news at all. With that being said unfortunately she continues to have issues with ongoing drainage not as severe as in the beginning but nonetheless still significant. I do think a wound VAC still would be a good way to go although her home health agency nurse apparently has some concerns about the possibility of not being able to keep a seal with this as they had struggles in the past. Nonetheless I explained to the patient that this is much different than what she had previous and that I really feel like it would do much better as far as getting the area taken care of without having any complications or issues here. I think that we should be able to maintain a seal. Nonetheless at this time I did discuss with the patient as well that she probably does need to have a wound VAC in order for [place] to get this moving in the right direction. Objective Constitutional Well-nourished and well-hydrated in no acute distress. Vitals Time Taken:  11:15 AM, Height: 63 in, Weight: 275 lbs, BMI: 48.7, Temperature: 98.3 F, [name]: 83 bpm, Respiratory Rate: 18 breaths/min, Blood Pressure: 168/93 mmHg. [name], [name] ([id number]) Respiratory normal breathing without difficulty. Psychiatric this patient is able to make decisions and demonstrates good insight into disease process. Alert and Oriented x 3. pleasant and cooperative. General Notes: Upon inspection patient's wound bed actually showed signs of still being very tight I think that if organ to do the wound  VAC we would need to either use silver nitrate or possibly even numbed the area and use a scalpel to slightly open the wound opening a little bit more so that we can fit the Hydrofera Blue down into the tunnel region. This will be better than not being able to get it then or having too small of a piece there were trying to get him. This was discussed with the patient today as well. Integumentary (Hair, Skin) Wound #1 status is Open. Original cause of wound was Surgical Injury. The date acquired was: 04/11/2021. The wound has been in treatment 3 weeks. The wound is located on the Distal,Midline Back. The wound measures 0.3cm length x 0.3cm width x 13cm depth; 0.071cm^2 area and 0.919cm^3 volume. There is Fat Layer (Subcutaneous Tissue) exposed. There is no tunneling or undermining noted. There is a large amount of serosanguineous drainage noted. There is large (67-100%) red granulation within the wound bed. 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 Nurse Visit as needed - nurse visit on thursday Home Health: [facility] for wound care. May utilize formulary equivalent dressing for wound treatment orders unless  otherwise specified. Home Health Nurse may visit PRN to address patient s wound care needs. - 3 times per week [facility] fax [phone] Bathing/ Shower/ Hygiene: May shower; gently cleanse wound with antibacterial soap, rinse and pat dry prior to dressing wounds Edema Control - Lymphedema / Segmental Compressive Device / Other: Elevate, Exercise Daily and Avoid Standing for Long Periods of Time. Elevate legs to the level of the heart and pump ankles as often as possible Elevate leg(s) parallel to the floor when sitting. WOUND #1: - Back Wound Laterality: Midline, Distal Cleanser: Normal Saline 1 x Per Day/30 Days Discharge Instructions: Wash your hands with soap and water. Remove old dressing, discard into plastic bag and place into trash. Cleanse the wound with Normal Saline prior to applying a clean dressing using gauze sponges, not tissues or cotton balls. Do not scrub or use excessive force. Pat dry using gauze sponges, not tissue or cotton balls. Primary Dressing: Hydrofera Blue Classic Foam Rope Dressing, 9x6 (mm/in) 1 x Per Day/30 Days Secondary Dressing: Zetuvit Plus Silicone Non-bordered 5x5 (in/in) 1 x Per Day/30 Days 1. I still believe firmly that a wound VAC would be the ideal thing for this patient. With that being said I understand there are some complicating factors that may need to be considered when it comes down to this. I believe that based on what we are seeing however that the wound VAC is probably her best bet in getting this closed without additional surgery. 2. With regard to the dressing we will get a continue with the Hydrofera Blue rope into the wound. Again I may need to open this wound a little bit larger and externally so that we can actually get the dressing appropriately into the wound bed and where we can apply the wound VAC. I feel like a wound VAC with seal appropriately I do not think this can be any significant issues here. We will see patient back for  reevaluation in 1 week here in the clinic. If anything worsens or changes patient will contact our office for additional recommendations. Electronic Signature(s) [name], [name] ([id number]) Signed: 11/06/2021 2:49:00 PM By: [name] PA-C Entered By: Linwood Dibbles,  Damare Serano on 11/06/2021 14:48:59 [name], [name] ([id number]) -------------------------------------------------------------------------------- SuperBill Details Patient Name: [name], [name] Date of Service: 11/06/2021 Medical Record Number: [id number] Patient Account Number: [id number] Date of Birth/Sex: 07-01-1961 (60 y.o. 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 G35 Multiple sclerosis I10 Essential (primary) hypertension Facility Procedures CPT4 Code: [id number] Description: 99213 - WOUND CARE VISIT-LEV 3 EST PT Modifier: Quantity: 1 Physician Procedures CPT4 Code: [id number] Description: 99214 - WC PHYS LEVEL 4 - EST PT Modifier: Quantity: 1 CPT4 Code: Description: ICD-10 Diagnosis Description T81.31XA Disruption of external operation (surgical) wound, not elsewhere classifi 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

## 2021-11-13 ENCOUNTER — Encounter: Payer: 59 | Admitting: Physician Assistant

## 2021-11-13 ENCOUNTER — Other Ambulatory Visit: Payer: Self-pay

## 2021-11-13 DIAGNOSIS — T8131XA Disruption of external operation (surgical) wound, not elsewhere classified, initial encounter: Secondary | ICD-10-CM | POA: Diagnosis not present

## 2021-11-15 ENCOUNTER — Other Ambulatory Visit
Admission: RE | Admit: 2021-11-15 | Discharge: 2021-11-15 | Disposition: A | Discharge status: Home / Self Care | Payer: 59 | Source: Ambulatory Visit | Attending: Physician Assistant | Admitting: Physician Assistant

## 2021-11-15 ENCOUNTER — Other Ambulatory Visit: Payer: Self-pay

## 2021-11-15 DIAGNOSIS — B999 Unspecified infectious disease: Secondary | ICD-10-CM | POA: Insufficient documentation

## 2021-11-15 DIAGNOSIS — T8131XA Disruption of external operation (surgical) wound, not elsewhere classified, initial encounter: Secondary | ICD-10-CM | POA: Diagnosis not present

## 2021-11-16 NOTE — Progress Notes (Signed)
[name], [name] ([id number]) Visit Report for 11/15/2021 Arrival Information Details Patient Name: [name], [name] Date of Service: 11/15/2021 10:45 AM Medical Record Number: [id number] Patient Account Number: [id number] Date of Birth/Sex: 1961-01-29 (60 y.o. F) Treating RN: Huel Coventry Primary Care Marka Treloar: [name] Other Clinician: Referring Noboru Bidinger: Card, John Treating Yicel Shannon/Extender: Rowan Blase in Treatment: 4 Visit Information History Since Last Visit Has Dressing in Place as Prescribed: Yes Patient Arrived: Walker Pain Present Now: Yes Arrival Time: 10:54 Accompanied By: self Transfer Assistance: None Patient Identification Verified: Yes Secondary Verification Process Completed: Yes Patient Requires Transmission-Based Precautions: No Patient Has Alerts: No Electronic Signature(s) Signed: 11/15/2021 6:31:02 PM By: Elliot Gurney, BSN, RN, CWS, Kim RN, BSN Entered By: Elliot Gurney, BSN, RN, CWS, Kim on 11/15/2021 10:55:18 Raymundo, Domingo Pulse ([id number]) -------------------------------------------------------------------------------- Clinic Level of Care Assessment Details Patient Name: [name], [name] Date of Service: 11/15/2021 10:45 AM Medical Record Number: [id number] Patient Account Number: [id number] Date of Birth/Sex: 06-12-1961 (60 y.o. F) Treating RN: Huel Coventry Primary Care Sherleen Pangborn: Card, Jonny Ruiz Other Clinician: Referring Devaney Segers: Card, John Treating Demarri Elie/Extender: Rowan Blase in Treatment: 4 Clinic Level of Care Assessment Items TOOL 4 Quantity Score []  - Use when only an EandM is performed on FOLLOW-UP visit 0 ASSESSMENTS - Nursing Assessment / Reassessment X - Reassessment of Co-morbidities (includes updates in patient status) 1 10 X- 1 5 Reassessment of Adherence to Treatment Plan ASSESSMENTS - Wound and Skin Assessment / Reassessment X - Simple Wound Assessment / Reassessment - one wound 1 5 []  - 0 Complex Wound Assessment / Reassessment  - multiple wounds []  - 0 Dermatologic / Skin Assessment (not related to wound area) ASSESSMENTS - Focused Assessment []  - Circumferential Edema Measurements - multi extremities 0 []  - 0 Nutritional Assessment / Counseling / Intervention []  - 0 Lower Extremity Assessment (monofilament, tuning fork, pulses) []  - 0 Peripheral Arterial Disease Assessment (using hand held doppler) ASSESSMENTS - Ostomy and/or Continence Assessment and Care []  - Incontinence Assessment and Management 0 []  - 0 Ostomy Care Assessment and Management (repouching, etc.) PROCESS - Coordination of Care X - Simple Patient / Family Education for ongoing care 1 15 []  - 0 Complex (extensive) Patient / Family Education for ongoing care X- 1 10 Staff obtains , Records, Test Results / Process Orders []  - 0 Staff telephones HHA, Nursing Homes / Clarify orders / etc []  - 0 Routine Transfer to another Facility (non-emergent condition) []  - 0 Routine Hospital Admission (non-emergent condition) []  - 0 New Admissions / / Ordering NPWT, Apligraf, etc. []  - 0 Emergency Hospital Admission (emergent condition) X- 1 10 Simple Discharge Coordination []  - 0 Complex (extensive) Discharge Coordination PROCESS - Special Needs []  - Pediatric / Minor Patient Management 0 []  - 0 Isolation Patient Management []  - 0 Hearing / Language / Visual special needs []  - 0 Assessment of Community assistance (transportation, D/C planning, etc.) []  - 0 Additional assistance / Altered mentation []  - 0 Support Surface(s) Assessment (bed, cushion, seat, etc.) INTERVENTIONS - Wound Cleansing / Measurement Kimery, Aniayah C. ( ) X- 1 5 Simple Wound Cleansing - one wound []  - 0 Complex Wound Cleansing - multiple wounds X- 1 5 Wound Imaging (photographs - any number of wounds) []  - 0 Wound Tracing (instead of photographs) X- 1 5 Simple Wound Measurement - one wound []  - 0 Complex Wound  Measurement - multiple wounds INTERVENTIONS - Wound Dressings []  - Small Wound Dressing one or multiple wounds 0 X- 1 15 Medium  Wound Dressing one or multiple wounds []  - 0 Large Wound Dressing one or multiple wounds []  - 0 Application of Medications - topical []  - 0 Application of Medications - injection INTERVENTIONS - Miscellaneous []  - External ear exam 0 X- 1 5 Specimen Collection (cultures, biopsies, blood, body fluids, etc.) X- 1 5 Specimen(s) / Culture(s) sent or taken to Lab for analysis []  - 0 Patient Transfer (multiple staff / / Similar devices) []  - 0 Simple Staple / Suture removal (25 or less) []  - 0 Complex Staple / Suture removal (26 or more) []  - 0 Hypo / Hyperglycemic Management (close monitor of Blood Glucose) []  - 0 Ankle / Brachial Index (ABI) - do not check if billed separately []  - 0 Vital Signs Has the patient been seen at the hospital within the last three years: Yes Total Score: 95 Level Of Care: New/Established - Level 3 Electronic Signature(s) Signed: 11/15/2021 6:31:02 PM By: , BSN, RN, CWS, Kim RN, BSN Entered By: , BSN, RN, CWS, Kim on 11/15/2021 11:13:13 Hascall, Nurse, adult ( ) -------------------------------------------------------------------------------- Encounter Discharge Information Details Patient Name: [name], [name] Date of Service: 11/15/2021 10:45 AM Medical Record Number: Patient Account Number: Date of Birth/Sex: 1961-10-31 (60 y.o. F) Treating RN: Elliot Gurney Primary Care Stephan Nelis: Elliot Gurney Other Clinician: Referring Laszlo Ellerby: Card, John Treating Danene Montijo/Extender: 11/17/2021 in Treatment: 4 Encounter Discharge Information Items Discharge Condition: Stable Ambulatory Status: Walker Discharge Destination: Home Transportation: Private Auto Accompanied By: self Schedule Follow-up Appointment: Yes Clinical Summary of Care: Electronic Signature(s) Signed:  11/15/2021 6:31:02 PM By: [id number], BSN, RN, CWS, Kim RN, BSN Entered By: 11/17/2021, BSN, RN, CWS, Kim on 11/15/2021 11:12:41 Desena, [id number] (09/11/1961) -------------------------------------------------------------------------------- Pain Assessment Details Patient Name: Rost, Wavie C. Date of Service: 11/15/2021 10:45 AM Medical Record Number: Huel Coventry Patient Account Number: [name] Date of Birth/Sex: Jul 24, 1961 (60 y.o. F) Treating RN: Elliot Gurney Primary Care Ayzia Day: Elliot Gurney Other Clinician: Referring Obed Samek: Card, John Treating Mrk Buzby/Extender: 11/17/2021 in Treatment: 4 Active Problems Location of Pain Severity and Description of Pain Patient Has Paino Yes Site Locations Pain Location: Pain in Ulcers Rate the pain. Current Pain Level: 10 Pain Management and Medication Current Pain Management: Electronic Signature(s) Signed: 11/15/2021 6:31:02 PM By: [id number], BSN, RN, CWS, Kim RN, BSN Entered By: 11/17/2021, BSN, RN, CWS, Kim on 11/15/2021 11:11:02 [place]undon, [id number] (09/11/1961) -------------------------------------------------------------------------------- Wound Assessment Details Patient Name: Stelle, Dalores C. Date of Service: 11/15/2021 10:45 AM Medical Record Number: Huel Coventry Patient Account Number: [name] Date of Birth/Sex: Aug 01, 1961 (60 y.o. F) Treating RN: Elliot Gurney Primary Care Tamber Burtch: Elliot Gurney Other Clinician: Referring Rubee Vega: Card, John Treating Demichael Traum/Extender: 11/17/2021 in Treatment: 4 Wound Status Wound Number: 1 Primary Etiology: Dehisced Wound Wound Location: Distal, Midline Back Wound Status: Open Wounding Event: Surgical Injury Comorbid History: Hypertension Date Acquired: 04/11/2021 Weeks Of Treatment: 4 Clustered Wound: No Wound Measurements Length: (cm) 0.3 Width: (cm) 0.3 Depth: (cm) 13 Area: (cm) 0.071 Volume: (cm) 0.919 % Reduction in Area: 43.7% % Reduction in Volume:  -59% Epithelialization: None Wound Description Classification: Full Thickness Without Exposed Support Structu Exudate Amount: Large Exudate Type: Serosanguineous Exudate Color: red, brown res Wound Bed Granulation Amount: Large (67-100%) Exposed Structure Granulation Quality: Red Fascia Exposed: No Fat Layer (Subcutaneous Tissue) Exposed: Yes Tendon Exposed: No Muscle Exposed: No Joint Exposed: No Bone Exposed: No Treatment Notes Wound #1 (Back) Wound Laterality: Midline, Distal Cleanser Normal Saline Discharge Instruction: Wash your hands with soap and water.  Remove old dressing, discard into plastic bag and place into trash. Cleanse the wound with Normal Saline prior to applying a clean dressing using gauze sponges, not tissues or cotton balls. Do not scrub or use excessive force. Pat dry using gauze sponges, not tissue or cotton balls. Peri-Wound Care Topical Primary Dressing Hydrofera Blue Classic Foam Rope Dressing, 9x6 (mm/in) Secondary Dressing Zetuvit Plus Silicone Non-bordered 5x5 (in/in) Secured With Compression Wrap Compression Stockings Add-Ons DYLAN, RUOTOLO ([id number]) Electronic Signature(s) Signed: 11/15/2021 6:31:02 PM By: Elliot Gurney, BSN, RN, CWS, Kim RN, BSN Entered By: Elliot Gurney, BSN, RN, CWS, Kim on 11/15/2021 11:10:36

## 2021-11-16 NOTE — Progress Notes (Signed)
CHARLYE, YOFFE ([id number]) Visit Report for 11/13/2021 Chief Complaint Document Details Patient Name: [name], [name] Date of Service: 11/13/2021 11:00 AM Medical Record Number: [id number] Patient Account Number: [id number] Date of Birth/Sex: 19-Jan-1961 (60 y.o. F) Treating RN: Yevonne Pax Primary Care Provider: [name] Other Clinician: Referring Provider: Card, John Treating Provider/Extender: Rowan Blase in Treatment: 4 Information Obtained from: Patient Chief Complaint Surgical Back Ulcer Electronic Signature(s) Signed: 11/13/2021 12:12:47 PM By: [name] PA-C Entered By: [name] on 11/13/2021 12:12:47 Pawelski, Samayah Salena Saner ([id number]) -------------------------------------------------------------------------------- Debridement Details Patient Name: [name], [name] Date of Service: 11/13/2021 11:00 AM Medical Record Number: [id number] Patient Account Number: [id number] Date of Birth/Sex: Mar 12, 1961 (59 y.o. F) Treating RN: Yevonne Pax Primary Care Provider: [name] Other Clinician: Referring Provider: Card, John Treating Provider/Extender: Rowan Blase in Treatment: 4 Debridement Performed for Wound #1 Distal,Midline Back Assessment: Performed By: Physician Nelida Meuse., PA-C Debridement Type: Debridement Level of Consciousness (Pre- Awake and Alert procedure): Pre-procedure Verification/Time Out Yes - 12:00 Taken: Start Time: 12:00 Total Area Debrided (L x W): 0.3 (cm) x 0.3 (cm) = 0.09 (cm) Tissue and other material Viable, Non-Viable, Subcutaneous debrided: Level: Skin/Subcutaneous Tissue Debridement Description: Excisional Instrument: Curette Bleeding: Moderate Hemostasis Achieved: Silver Nitrate End Time: 12:10 Procedural Pain: 6 Post Procedural Pain: 1 Response to Treatment: Procedure was tolerated well Level of Consciousness (Post- Awake and Alert procedure): Post Debridement Measurements of Total  Wound Length: (cm) 0.3 Width: (cm) 0.3 Depth: (cm) 13 Volume: (cm) 0.919 Character of Wound/Ulcer Post Debridement: Improved Post Procedure Diagnosis Same as Pre-procedure Electronic Signature(s) Signed: 11/13/2021 4:51:13 PM By: [name] PA-C Signed: 11/16/2021 10:13:49 AM By: Yevonne Pax RN Entered By: Yevonne Pax on 11/13/2021 12:09:52 Fadely, Geneal C. ([id number]) -------------------------------------------------------------------------------- HPI Details Patient Name: [name], [name] Date of Service: 11/13/2021 11:00 AM Medical Record Number: [id number] Patient Account Number: [id number] Date of Birth/Sex: Mar 31, 1961 (60 y.o. F) Treating RN: Yevonne Pax Primary Care Provider: [name] Other Clinician: Referring Provider: Card, John Treating Provider/Extender: Rowan Blase in Treatment: 4 History of Present Illness HPI Description: 10/15/2021 upon evaluation today patient presents for initial evaluation here in the clinic concerning a surgical ulceration/dehiscence in the lumbar spine region following surgery that she had over the past year. This was actually broken up into 3 separate surgical events. The initial surgical intervention actually was on November 05, 2021 almost a year ago. Subsequently the patient went back in February for a seroma of the area which unfortunately required her to have a repeat surgery to go in and clean this out. And then again this occurred in April where she went back in and again they felt like stitches were coming out and there was an additional seroma. She was placed in a wound VAC initially and then subsequently as it got smaller that was discontinued. Again right now I will see anything that I think a wound VAC would help with. Nonetheless she definitely has a significant depth to the wound that is going require packing. I actually believe the Hydrofera Blue rope would probably do quite well with this the problem is as much  as it is draining she probably needs this to be changed at least every day. She does not really have anyone that can help with that that is the complicating scenario here. With that being said the patient does have a history of multiple sclerosis, hypertension, and again this surgical wound dehiscence in regard to her lumbar spine region.  She did have a repeat MRI which was actually completed 10/09/2021. This showed that there was no significant change in the subcutaneous fluid collection/track of the lower lumbar region. This is extending to the level of the fascia unfortunately. This seems to go all the way from the L2 level with a track extending all the way to the fascia at the L4-5 level. Again this is a significant wound and there is significant drainage but does not seem to communicate to the spinal region as far as spinal fluid or otherwise is concerned that is good news. Nonetheless she last saw Dr. Franky Macho who is her neurosurgeon on 10/01/2021 that was when he ordered this last MRI she supposed to see him next week as well. With that being said he did not feel like there was any significant issue there but was not sure why this was not healing that is when he ordered the MRI. They were wanting to make sure that this was packed appropriately by home health unfortunately the main issue currently is that home health is completely out of the picture as the patient has exhausted all the home health that that she gets for a year. She is now in a very difficult predicament where she does not have anyone to help her change the dressing and to be honest that she is not able to do it herself with the location of the wound being on the midline lumbar spine region. If she does not have anyone that can help it is probably can to be necessary for her to go to a facility for rehab and daily dressing changes as I feel like daily changes which is much drainage that she is having is going to be  necessary. 10/23/2021 upon evaluation today patient appears to be doing decently well in regard to her back ulcer. This does seem to be draining a lot less than what it is been doing in the past. With that being said she still has quite a bit of drainage nonetheless. I do think that given time this should improve least I hope so. The good news is she does have home health coming out 3 days a week were doing it 2 days a week and she is paying someone we can to help. 10/30/2021 upon evaluation today patient appears to be doing okay in regard to her back ulcer this is not draining quite as bad as it was in the beginning but he still has quite a bit of drainage noted. I do believe that the patient would benefit from [place] going ahead forward with attempting a wound VAC using the Hydrofera Blue rope to pack with and then subsequently using the VAC externally to actually suction out and help this to fill- in. I think this is our ideal way to try to get things cleared at this point. As it stands I am not certain that we are really making a progress that we want to see near with doing it in the way we are which is packing with the rope. It is a good dressing but I do think it is insufficient for total healing. She just seems to have too much in the way of drainage at this point unfortunately. 11/06/2021 upon evaluation today patient unfortunately continues to have issues with her back ongoing. The good news is her MRI that was repeated showed signs of the size of this area in the lumbar spine region having decreased from 4 cm to 3.5 cm this is definitely  not bad news at all. With that being said unfortunately she continues to have issues with ongoing drainage not as severe as in the beginning but nonetheless still significant. I do think a wound VAC still would be a good way to go although her home health agency nurse apparently has some concerns about the possibility of not being able to keep a seal with this as  they had struggles in the past. Nonetheless I explained to the patient that this is much different than what she had previous and that I really feel like it would do much better as far as getting the area taken care of without having any complications or issues here. I think that we should be able to maintain a seal. Nonetheless at this time I did discuss with the patient as well that she probably does need to have a wound VAC in order for [place] to get this moving in the right direction. 11/13/2021 upon evaluation patient's wound bed actually showed signs of significant drainage at this time. She did see the surgeon yesterday he did not see anything that appeared to be infected. Nonetheless he does appear that she is continuing to have areas here that just do not seem to want to seal up there MRI findings have been negative but nonetheless she continues to have is the seroma that is filling in. I do feel like we need to try to widen the hole so we can get at least a half of the [facility] then this will be better than nothing at this point. Electronic Signature(s) Signed: 11/13/2021 4:20:09 PM By: [name] PA-C Entered By: [name] on 11/13/2021 16:20:09 Puryear, Domingo Pulse ([id number]) -------------------------------------------------------------------------------- Physical Exam Details Patient Name: [name], [name] Date of Service: 11/13/2021 11:00 AM Medical Record Number: [id number] Patient Account Number: [id number] Date of Birth/Sex: May 01, 1961 (60 y.o. F) Treating RN: Yevonne Pax Primary Care Provider: [name] Other Clinician: Referring Provider: Card, John Treating Provider/Extender: Rowan Blase in Treatment: 4 Constitutional Well-nourished and well-hydrated in no acute distress. Respiratory normal breathing without difficulty. Psychiatric this patient is able to make decisions and demonstrates good insight into disease process. Alert and Oriented x 3.  pleasant and cooperative. Notes Upon inspection patient's wound bed did require some debridement around the opening of the wound to try to widen this. I did use a 5 mm curette and then used this to pare away some of the epiboly around the edges of the wound to try to allow for [place] to be able to put the [facility] in appropriately. She tolerated that today without complication and post debridement this seems to be doing better. There was some silver nitrate utilized as well to try to widen the area. Electronic Signature(s) Signed: 11/13/2021 4:20:59 PM By: [name] PA-C Entered By: [name] on 11/13/2021 16:20:59 Prom, Jaquala Salena Saner ([id number]) -------------------------------------------------------------------------------- Problem List Details Patient Name: [name], [name] Date of Service: 11/13/2021 11:00 AM Medical Record Number: [id number] Patient Account Number: [id number] Date of Birth/Sex: 22-Sep-1961 (60 y.o. 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 Date of Service: 11/13/2021 11:00 AM Medical Record Number:  [id number] Patient Account Number: [id number] Date of Birth/Sex: 10/18/1961 (60 y.o. F) Treating RN: Yevonne Pax Primary Care Provider: [name] Other Clinician: Referring Provider: Card, John Treating Provider/Extender: Rowan Blase in Treatment: 4 Subjective Chief Complaint Information obtained from Patient Surgical Back Ulcer History of Present Illness (HPI) 10/15/2021 upon evaluation today patient presents for initial evaluation here in the clinic concerning a surgical ulceration/dehiscence in the lumbar spine region following surgery that she had over the past year. This was actually broken up into 3 separate surgical events. The initial surgical intervention actually was on November 05, 2021 almost a year ago. Subsequently the patient went back in February for a seroma of the area which unfortunately required her to have a repeat surgery to go in and clean this out. And then again this occurred in April where she went back in and again they felt like stitches were coming out and there was an additional seroma. She was placed in a wound VAC initially and then subsequently as it got smaller that was discontinued. Again right now I will see anything that I think a wound VAC would help with. Nonetheless she definitely has a significant depth to the wound that is going require packing. I actually believe the Hydrofera Blue rope would probably do quite well with this the problem is as much as it is draining she probably needs this to be changed at least every day. She does not really have anyone that can help with that that is the complicating scenario here. With that being said the patient does have a history of multiple sclerosis, hypertension, and again this surgical wound dehiscence in regard to her lumbar spine region. She did have a repeat MRI which was actually completed 10/09/2021. This showed that there was no significant change in the subcutaneous fluid collection/track of the  lower lumbar region. This is extending to the level of the fascia unfortunately. This seems to go all the way from the L2 level with a track extending all the way to the fascia at the L4-5 level. Again this is a significant wound and there is significant drainage but does not seem to communicate to the spinal region as far as spinal fluid or otherwise is concerned that is good news. Nonetheless she last saw Dr. Franky Macho who is her neurosurgeon on 10/01/2021 that was when he ordered this last MRI she supposed to see him next week as well. With that being said he did not feel like there was any significant issue there but was not sure why this was not healing that is when he ordered the MRI. They were wanting to make sure that this was packed appropriately by home health unfortunately the main issue currently is that home health is completely out of the picture as the patient has exhausted all the home health that that she gets for a year. She is now in a very difficult predicament where she does not have anyone to help her change the dressing and to be honest that she is not able to do it herself with the location of the wound being on the midline lumbar spine region. If she does not have anyone that can help it is probably can to be necessary for her  to go to a facility for rehab and daily dressing changes as I feel like daily changes which is much drainage that she is having is going to be necessary. 10/23/2021 upon evaluation today patient appears to be doing decently well in regard to her back ulcer. This does seem to be draining a lot less than what it is been doing in the past. With that being said she still has quite a bit of drainage nonetheless. I do think that given time this should improve least I hope so. The good news is she does have home health coming out 3 days a week were doing it 2 days a week and she is paying someone we can to help. 10/30/2021 upon evaluation today patient appears to  be doing okay in regard to her back ulcer this is not draining quite as bad as it was in the beginning but he still has quite a bit of drainage noted. I do believe that the patient would benefit from [place] going ahead forward with attempting a wound VAC using the Hydrofera Blue rope to pack with and then subsequently using the VAC externally to actually suction out and help this to fill- in. I think this is our ideal way to try to get things cleared at this point. As it stands I am not certain that we are really making a progress that we want to see near with doing it in the way we are which is packing with the rope. It is a good dressing but I do think it is insufficient for total healing. She just seems to have too much in the way of drainage at this point unfortunately. 11/06/2021 upon evaluation today patient unfortunately continues to have issues with her back ongoing. The good news is her MRI that was repeated showed signs of the size of this area in the lumbar spine region having decreased from 4 cm to 3.5 cm this is definitely not bad news at all. With that being said unfortunately she continues to have issues with ongoing drainage not as severe as in the beginning but nonetheless still significant. I do think a wound VAC still would be a good way to go although her home health agency nurse apparently has some concerns about the possibility of not being able to keep a seal with this as they had struggles in the past. Nonetheless I explained to the patient that this is much different than what she had previous and that I really feel like it would do much better as far as getting the area taken care of without having any complications or issues here. I think that we should be able to maintain a seal. Nonetheless at this time I did discuss with the patient as well that she probably does need to have a wound VAC in order for [place] to get this moving in the right direction. 11/13/2021 upon evaluation  patient's wound bed actually showed signs of significant drainage at this time. She did see the surgeon yesterday he did not see anything that appeared to be infected. Nonetheless he does appear that she is continuing to have areas here that just do not seem to want to seal up there MRI findings have been negative but nonetheless she continues to have is the seroma that is filling in. I do feel like we need to try to widen the hole so we can get at least a half of the [facility] then this will be better than nothing at this  point. Objective Meuth, Joslynn C. ([id number]) Constitutional Well-nourished and well-hydrated in no acute distress. Vitals Time Taken: 11:24 AM, Height: 63 in, Weight: 275 lbs, BMI: 48.7, Temperature: 98.3 F, Pulse: 74 bpm, Respiratory Rate: 18 breaths/min, Blood Pressure: 182/84 mmHg. Respiratory normal breathing without difficulty. Psychiatric this patient is able to make decisions and demonstrates good insight into disease process. Alert and Oriented x 3. pleasant and cooperative. General Notes: Upon inspection patient's wound bed did require some debridement around the opening of the wound to try to widen this. I did use a 5 mm curette and then used this to pare away some of the epiboly around the edges of the wound to try to allow for [place] to be able to put the [facility] in appropriately. She tolerated that today without complication and post debridement this seems to be doing better. There was some silver nitrate utilized as well to try to widen the area. Integumentary (Hair, Skin) Wound #1 status is Open. Original cause of wound was Surgical Injury. The date acquired was: 04/11/2021. The wound has been in treatment 4 weeks. The wound is located on the Distal,Midline Back. The wound measures 0.3cm length x 0.3cm width x 13cm depth; 0.071cm^2 area and 0.919cm^3 volume. There is Fat Layer (Subcutaneous Tissue) exposed. There is no tunneling or undermining  noted. There is a large amount of serosanguineous drainage noted. There is large (67-100%) red granulation within the wound bed. 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 There was a Excisional Skin/Subcutaneous Tissue Debridement with a total area of 0.09 sq cm performed by Nelida Meuse., PA-C. With the following instrument(s): Curette to remove Viable and Non-Viable tissue/material. Material removed includes Subcutaneous Tissue. No specimens were taken. A time out was conducted at 12:00, prior to the start of the procedure. A Moderate amount of bleeding was controlled with Silver Nitrate. The procedure was tolerated well with a pain level of 6 throughout and a pain level of 1 following the procedure. Post Debridement Measurements: 0.3cm length x 0.3cm width x 13cm depth; 0.919cm^3 volume. Character of Wound/Ulcer Post Debridement is improved. Post procedure Diagnosis Wound #1: Same as Pre-Procedure Plan 1. Based on what I am seeing currently my suggestion is good to be that we go ahead and continue with the [facility] Blue packing I think that still the best way to go and the patient is in agreement with that plan. 2. I am also can recommend that we have the patient continue to monitor for any signs of worsening or infection if anything changes she should let me know. 3. I am good to see about getting a gauze VAC ordered for her I think this is good to be of utmost importance to try to get things moving in the right direction. We will see patient back for reevaluation in 1 week here in the clinic. If anything worsens or changes patient will contact our office for  additional recommendations. MALLY, GAVINA ([id number]) Electronic Signature(s) Signed: 11/13/2021 4:21:36 PM By: [name] PA-C Entered By: [name] on 11/13/2021 16:21:36 Knack, Domingo Pulse ([id number]) -------------------------------------------------------------------------------- SuperBill Details Patient Name: [name], [name] Date of Service: 11/13/2021 Medical Record Number: [id number] Patient Account Number: [id number] Date of Birth/Sex: [birth year] (60 y.o. 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 G35 Multiple sclerosis I10 Essential (primary) hypertension Facility Procedures CPT4 Code: [id number] Description: 11042 - DEB SUBQ TISSUE 20 SQ CM/< Modifier: Quantity: 1 CPT4 Code: Description: ICD-10 Diagnosis Description 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 Modifier: Quantity: Physician Procedures CPT4 Code: [id number] Description: 99214 - WC PHYS LEVEL 4 - EST PT Modifier: 25 Quantity: 1 CPT4 Code: Description: ICD-10 Diagnosis Description T81.31XA Disruption of external operation (surgical) wound, not elsewhere classifie 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

## 2021-11-16 NOTE — Progress Notes (Signed)
MIRIYA, CLOER ([id number]) Visit Report for 11/15/2021 Physician Orders Details Patient Name: [name], [name] Date of Service: 11/15/2021 10:45 AM Medical Record Number: [id number] Patient Account Number: [id number] Date of Birth/Sex: 1961/05/25 (60 y.o. F) Treating RN: Huel Coventry Primary Care Provider: [name] Other Clinician: Referring Provider: Card, John Treating Provider/Extender: Rowan Blase in Treatment: 4 Verbal / Phone Orders: No Diagnosis Coding Follow-up Appointments o Return Appointment in 1 week. o Nurse Visit as needed - nurse visit on thursday Home Health o [facility] for wound care. May utilize formulary equivalent dressing for wound treatment orders unless otherwise specified. Home Health Nurse may visit PRN to address patientos wound care needs. - 3 times per week [facility] fax [phone] Bathing/ Shower/ Hygiene o May shower; gently cleanse wound with antibacterial soap, rinse and pat dry prior to dressing wounds Edema Control - Lymphedema / Segmental Compressive Device / Other o Elevate, Exercise Daily and Avoid Standing for Long Periods of Time. o Elevate legs to the level of the heart and pump ankles as often as possible o Elevate leg(s) parallel to the floor when sitting. Wound Treatment Wound #1 - Back Wound Laterality: Midline, Distal Cleanser: Normal Saline 1 x Per Day/30 Days Discharge Instructions: Wash your hands with soap and water. Remove old dressing, discard into plastic bag and place into trash. Cleanse the wound with Normal Saline prior to applying a clean dressing using gauze sponges, not tissues or cotton balls. Do not scrub or use excessive force. Pat dry using gauze sponges, not tissue or cotton balls. Primary Dressing: Hydrofera Blue Classic Foam Rope Dressing, 9x6 (mm/in) 1 x Per Day/30 Days Secondary Dressing: Zetuvit Plus Silicone Non-bordered 5x5 (in/in) 1 x Per Day/30 Days Laboratory o Bacteria  identified in Wound by Culture (MICRO) - Back oooo LOINC Code: 6462-6 oooo Convenience Name: Wound culture routine Electronic Signature(s) Signed: 11/15/2021 5:41:09 PM By: [name] PA-C Signed: 11/15/2021 6:31:02 PM By: Elliot Gurney, BSN, RN, CWS, Kim RN, BSN Entered By: Elliot Gurney, BSN, RN, CWS, Kim on 11/15/2021 11:12:03 [name], [name] ([id number]) -------------------------------------------------------------------------------- SuperBill Details Patient Name: [name], [name] Date of Service: 11/15/2021 Medical Record Number: [id number] Patient Account Number: [id number] Date of Birth/Sex: 1961/02/12 (60 y.o. 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

## 2021-11-16 NOTE — Progress Notes (Signed)
JACKILYN, UMPHLETT ([id number]) Visit Report for 11/13/2021 Arrival Information Details Patient Name: [name], [name] Date of Service: 11/13/2021 11:00 AM Medical Record Number: [id number] Patient Account Number: [id number] Date of Birth/Sex: 07/02/1961 (60 y.o. F) Treating RN: [name] Primary Care [name]: Card, [name] Other Clinician: Referring [name]: Card, [name] Treating [name]/Extender: [name] in Treatment: 4 Visit Information History Since Last Visit All ordered tests and consults were completed: No Patient Arrived: [name] Added or deleted any medications: No Arrival Time: 11:19 Any new allergies or adverse reactions: No Accompanied By: self Had a fall or experienced change in No Transfer Assistance: None activities of daily living that may affect Patient Identification Verified: Yes risk of falls: Secondary Verification Process Completed: Yes Signs or symptoms of abuse/neglect since last visito No Patient Requires Transmission-Based Precautions: No Hospitalized since last visit: No Patient Has Alerts: No Implantable device outside of the clinic excluding No cellular tissue based products placed in the center since last visit: Has Dressing in Place as Prescribed: Yes Pain Present Now: No Electronic Signature(s) Signed: 11/16/2021 10:13:49 AM By: [name] Pax RN Entered By: [name] on 11/13/2021 11:24:23 [name], [name] [name] ([id number]) -------------------------------------------------------------------------------- Lower Extremity Assessment Details Patient Name: [name], [name] Date of Service: 11/13/2021 11:00 AM Medical Record Number: [id number] Patient Account Number: [id number] Date of Birth/Sex: 07-09-1961 (60 y.o. F) Treating RN: [name] Primary Care Niyam Bisping: [name] Other Clinician: Referring [name]: Card, [name] Treating [name]/Extender: [name] in Treatment: 4 Electronic Signature(s) Signed: 11/16/2021  10:13:49 AM By: [name] Pax RN Entered By: [name] on 11/13/2021 11:50:38 [name], [name] [name] ([id number]) -------------------------------------------------------------------------------- Multi Wound Chart Details Patient Name: [name], [name] Date of Service: 11/13/2021 11:00 AM Medical Record Number: [id number] Patient Account Number: [id number] Date of Birth/Sex: Aug 04, 1961 (60 y.o. F) Treating RN: [name] Primary Care Benjermin Korber: [name] Other Clinician: Referring [name]: Card, [name] Treating [name]/Extender: [name] in Treatment: 4 Vital Signs Height(in): 63 Pulse(bpm): 74 Weight(lbs): 275 Blood Pressure(mmHg): 182/84 Body Mass Index(BMI): 49 Temperature(F): 98.3 Respiratory Rate(breaths/min): 18 Photos: [N/A:N/A] Wound Location: Distal, Midline Back N/A N/A Wounding Event: Surgical Injury N/A N/A Primary Etiology: Dehisced Wound N/A N/A Comorbid History: Hypertension N/A N/A Date Acquired: 04/11/2021 N/A N/A Weeks of Treatment: 4 N/A N/A Wound Status: Open N/A N/A Measurements L x W x D (cm) 0.3x0.3x13 N/A N/A Area (cm) : 0.071 N/A N/A Volume (cm) : 0.919 N/A N/A % Reduction in Area: 43.70% N/A N/A % Reduction in Volume: -59.00% N/A N/A Classification: Full Thickness Without Exposed N/A N/A Support Structures Exudate Amount: Large N/A N/A Exudate Type: Serosanguineous N/A N/A Exudate Color: red, brown N/A N/A Granulation Amount: Large (67-100%) N/A N/A Granulation Quality: Red N/A N/A Exposed Structures: Fat Layer (Subcutaneous Tissue): N/A N/A Yes Fascia: No Tendon: No Muscle: No Joint: No Bone: No Epithelialization: None N/A N/A Treatment Notes Electronic Signature(s) Signed: 11/16/2021 10:13:49 AM By: [name] Pax RN Entered By: [name] on 11/13/2021 11:53:53 [name], [name] ([id number]) -------------------------------------------------------------------------------- Multi-Disciplinary Care Plan Details Patient  Name: [name], [name] Date of Service: 11/13/2021 11:00 AM Medical Record Number: [id number] Patient Account Number: [id number] Date of Birth/Sex: 1961-09-14 (60 y.o. F) Treating RN: [name] Primary Care [name]: [name] Other Clinician: Referring Joylyn [name]: Card, [name] Treating Rhylee Pucillo/Extender: [name] in Treatment: 4 Active Inactive Wound/Skin Impairment Nursing Diagnoses: Knowledge deficit related to ulceration/compromised skin integrity Goals: Patient/caregiver will verbalize understanding of skin care regimen Date Initiated: 10/15/2021 Target Resolution Date: 11/15/2021 Goal Status: Active Ulcer/skin breakdown will  have a volume reduction of 30% by week 4 Date Initiated: 10/15/2021 Target Resolution Date: 12/15/2021 Goal Status: Active Ulcer/skin breakdown will have a volume reduction of 50% by week 8 Date Initiated: 10/15/2021 Target Resolution Date: 01/15/2022 Goal Status: Active Ulcer/skin breakdown will have a volume reduction of 80% by week 12 Date Initiated: 10/15/2021 Target Resolution Date: 02/15/2022 Goal Status: Active Ulcer/skin breakdown will heal within 14 weeks Date Initiated: 10/15/2021 Target Resolution Date: 03/15/2022 Goal Status: Active Interventions: Assess patient/caregiver ability to obtain necessary supplies Assess patient/caregiver ability to perform ulcer/skin care regimen upon admission and as needed Assess ulceration(s) every visit Notes: Electronic Signature(s) Signed: 11/16/2021 10:13:49 AM By: [name] Pax RN Entered By: [name] on 11/13/2021 11:51:00 [name], [name] ([id number]) -------------------------------------------------------------------------------- Pain Assessment Details Patient Name: [name], [name] Date of Service: 11/13/2021 11:00 AM Medical Record Number: [id number] Patient Account Number: [id number] Date of Birth/Sex: January 30, 1961 (60 y.o. F) Treating RN: [name] Primary Care Alfons Sulkowski:  [name] Other Clinician: Referring [name]: Card, [name] Treating Jorden Minchey/Extender: [name] in Treatment: 4 Active Problems Location of Pain Severity and Description of Pain Patient Has Paino No Site Locations Pain Management and Medication Current Pain Management: Electronic Signature(s) Signed: 11/16/2021 10:13:49 AM By: [name] Pax RN Entered By: [name] on 11/13/2021 11:24:58 [name], [name] ([id number]) -------------------------------------------------------------------------------- Wound Assessment Details Patient Name: [name], [name] Date of Service: 11/13/2021 11:00 AM Medical Record Number: [id number] Patient Account Number: [id number] Date of Birth/Sex: [birth year] (60 y.o. F) Treating RN: [name] Primary Care Marsel Gail: Card, [name] Other Clinician: Referring Tyreka Henneke: Card, [name] Treating Geovannie Vilar/Extender: [name] in Treatment: 4 Wound Status Wound Number: 1 Primary Etiology: Dehisced Wound Wound Location: Distal, Midline Back Wound Status: Open Wounding Event: Surgical Injury Comorbid History: Hypertension Date Acquired: 04/11/2021 Weeks Of Treatment: 4 Clustered Wound: No Photos Wound Measurements Length: (cm) 0.3 Width: (cm) 0.3 Depth: (cm) 13 Area: (cm) 0.071 Volume: (cm) 0.919 % Reduction in Area: 43.7% % Reduction in Volume: -59% Epithelialization: None Tunneling: No Undermining: No Wound Description Classification: Full Thickness Without Exposed Support Structu Exudate Amount: Large Exudate Type: Serosanguineous Exudate Color: red, brown res Wound Bed Granulation Amount: Large (67-100%) Exposed Structure Granulation Quality: Red Fascia Exposed: No Fat Layer (Subcutaneous Tissue) Exposed: Yes Tendon Exposed: No Muscle Exposed: No Joint Exposed: No Bone Exposed: No Electronic Signature(s) Signed: 11/16/2021 10:13:49 AM By: [name] Pax RN Entered By: [name] on 11/13/2021 11:29:57 [name],  [name] ([id number]) -------------------------------------------------------------------------------- Vitals Details Patient Name: [name], [name] Date of Service: 11/13/2021 11:00 AM Medical Record Number: [id number] Patient Account Number: [id number] Date of Birth/Sex: [birth year] (60 y.o. F) Treating RN: [name] Primary Care Shanette Tamargo: [name] Other Clinician: Referring Jazzlyn Huizenga: Card, [name] Treating Lynnae Ludemann/Extender: [name] in Treatment: 4 Vital Signs Time Taken: 11:24 Temperature (F): 98.3 Height (in): 63 Pulse (bpm): 74 Weight (lbs): 275 Respiratory Rate (breaths/min): 18 Body Mass Index (BMI): 48.7 Blood Pressure (mmHg): 182/84 Reference Range: 80 - 120 mg / dl Electronic Signature(s) Signed: 11/16/2021 10:13:49 AM By: [name] Pax RN Entered By: [name] on 11/13/2021 11:24:46

## 2021-11-19 ENCOUNTER — Encounter (HOSPITAL_BASED_OUTPATIENT_CLINIC_OR_DEPARTMENT_OTHER): Payer: 59 | Admitting: Internal Medicine

## 2021-11-20 ENCOUNTER — Other Ambulatory Visit: Payer: Self-pay

## 2021-11-20 ENCOUNTER — Encounter: Payer: 59 | Admitting: Physician Assistant

## 2021-11-20 DIAGNOSIS — T8131XA Disruption of external operation (surgical) wound, not elsewhere classified, initial encounter: Secondary | ICD-10-CM | POA: Diagnosis not present

## 2021-11-20 NOTE — Progress Notes (Addendum)
FORTUNE, TOROSIAN ([id number]) Visit Report for 11/20/2021 Chief Complaint Document Details Patient Name: Bowdoin, Kaloni C. Date of Service: 11/20/2021 12:30 PM Medical Record Number: [id number] Patient Account Number: [id number] Date of Birth/Sex: 1961-04-05 (60 y.o. F) Treating RN: Yevonne Pax Primary Care Provider: [name] Other Clinician: Referring Provider: Card, John Treating Provider/Extender: Rowan Blase in Treatment: 5 Information Obtained from: Patient Chief Complaint Surgical Back Ulcer Electronic Signature(s) Signed: 11/20/2021 1:06:17 PM By: [name] PA-C Entered By: [name] on 11/20/2021 13:06:16 Longan, Moe CMarland Kitchen ([id number]) -------------------------------------------------------------------------------- HPI Details Patient Name: Jemison, [name] C. Date of Service: 11/20/2021 12:30 PM Medical Record Number: [id number] Patient Account Number: [id number] Date of Birth/Sex: May 22, 1961 (60 y.o. F) Treating RN: Yevonne Pax Primary Care Provider: [name] Other Clinician: Referring Provider: Card, John Treating Provider/Extender: Rowan Blase in Treatment: 5 History of Present Illness HPI Description: 10/15/2021 upon evaluation today patient presents for initial evaluation here in the clinic concerning a surgical ulceration/dehiscence in the lumbar spine region following surgery that she had over the past year. This was actually broken up into 3 separate surgical events. The initial surgical intervention actually was on November 05, 2021 almost a year ago. Subsequently the patient went back in February for a seroma of the area which unfortunately required her to have a repeat surgery to go in and clean this out. And then again this occurred in April where she went back in and again they felt like stitches were coming out and there was an additional seroma. She was placed in a wound VAC initially and then subsequently as it got smaller  that was discontinued. Again right now I will see anything that I think a wound VAC would help with. Nonetheless she definitely has a significant depth to the wound that is going require packing. I actually believe the Hydrofera Blue rope would probably do quite well with this the problem is as much as it is draining she probably needs this to be changed at least every day. She does not really have anyone that can help with that that is the complicating scenario here. With that being said the patient does have a history of multiple sclerosis, hypertension, and again this surgical wound dehiscence in regard to her lumbar spine region. She did have a repeat MRI which was actually completed 10/09/2021. This showed that there was no significant change in the subcutaneous fluid collection/track of the lower lumbar region. This is extending to the level of the fascia unfortunately. This seems to go all the way from the L2 level with a track extending all the way to the fascia at the L4-5 level. Again this is a significant wound and there is significant drainage but does not seem to communicate to the spinal region as far as spinal fluid or otherwise is concerned that is good news. Nonetheless she last saw Dr. Franky Macho who is her neurosurgeon on 10/01/2021 that was when he ordered this last MRI she supposed to see him next week as well. With that being said he did not feel like there was any significant issue there but was not sure why this was not healing that is when he ordered the MRI. They were wanting to make sure that this was packed appropriately by home health unfortunately the main issue currently is that home health is completely out of the picture as the patient has exhausted all the home health that that she gets for a year. She is now in a very difficult  predicament where she does not have anyone to help her change the dressing and to be honest that she is not able to do it herself with the location  of the wound being on the midline lumbar spine region. If she does not have anyone that can help it is probably can to be necessary for her to go to a facility for rehab and daily dressing changes as I feel like daily changes which is much drainage that she is having is going to be necessary. 10/23/2021 upon evaluation today patient appears to be doing decently well in regard to her back ulcer. This does seem to be draining a lot less than what it is been doing in the past. With that being said she still has quite a bit of drainage nonetheless. I do think that given time this should improve least I hope so. The good news is she does have home health coming out 3 days a week were doing it 2 days a week and she is paying someone we can to help. 10/30/2021 upon evaluation today patient appears to be doing okay in regard to her back ulcer this is not draining quite as bad as it was in the beginning but he still has quite a bit of drainage noted. I do believe that the patient would benefit from [place] going ahead forward with attempting a wound VAC using the Hydrofera Blue rope to pack with and then subsequently using the VAC externally to actually suction out and help this to fill- in. I think this is our ideal way to try to get things cleared at this point. As it stands I am not certain that we are really making a progress that we want to see near with doing it in the way we are which is packing with the rope. It is a good dressing but I do think it is insufficient for total healing. She just seems to have too much in the way of drainage at this point unfortunately. 11/06/2021 upon evaluation today patient unfortunately continues to have issues with her back ongoing. The good news is her MRI that was repeated showed signs of the size of this area in the lumbar spine region having decreased from 4 cm to 3.5 cm this is definitely not bad news at all. With that being said unfortunately she continues to have  issues with ongoing drainage not as severe as in the beginning but nonetheless still significant. I do think a wound VAC still would be a good way to go although her home health agency nurse apparently has some concerns about the possibility of not being able to keep a seal with this as they had struggles in the past. Nonetheless I explained to the patient that this is much different than what she had previous and that I really feel like it would do much better as far as getting the area taken care of without having any complications or issues here. I think that we should be able to maintain a seal. Nonetheless at this time I did discuss with the patient as well that she probably does need to have a wound VAC in order for [place] to get this moving in the right direction. 11/13/2021 upon evaluation patient's wound bed actually showed signs of significant drainage at this time. She did see the surgeon yesterday he did not see anything that appeared to be infected. Nonetheless he does appear that she is continuing to have areas here that just do not seem  to want to seal up there MRI findings have been negative but nonetheless she continues to have is the seroma that is filling in. I do feel like we need to try to widen the hole so we can get at least a half of the [facility] then this will be better than nothing at this point. 11/20/2021 upon evaluation today patient actually appears to be having less pain at this point which is good news and overall she we still do not have the results of the culture back yet it had to be sent out to [facility] and we do not have the result back yet. Is doing decently well in regard to her wound. Fortunately there does not appear to be any signs of active infection systemically nor locally at this time. Electronic Signature(s) Signed: 11/20/2021 4:45:16 PM By: [name] PA-C Entered By: [name] on 11/20/2021 16:45:15 Nicolini, Domingo Pulse  ([id number]) -------------------------------------------------------------------------------- Physical Exam Details Patient Name: Dolley, Lindee C. Date of Service: 11/20/2021 12:30 PM Medical Record Number: [id number] Patient Account Number: [id number] Date of Birth/Sex: [birth year] (60 y.o. F) Treating RN: Yevonne Pax Primary Care Provider: [name] Other Clinician: Referring Provider: Card, John Treating Provider/Extender: Rowan Blase in Treatment: 5 Constitutional Well-nourished and well-hydrated in no acute distress. Respiratory normal breathing without difficulty. Psychiatric this patient is able to make decisions and demonstrates good insight into disease process. Alert and Oriented x 3. pleasant and cooperative. Notes Upon inspection patient's wound bed actually still appears to be quite open as far as the actual opening into the wound is concerned and I do not see any signs of infection at this time systemically though likely due to still cloudy drainage here that has been a little bit concerned. There is still significant undermining that goes at the 12:00 location down to the 6:00 location counterclockwise. Clockwise from 12 down to 6 this does not appear to be showing any signs of issue here. Overall I am concerned about the fact that we are having trouble getting this area to heal appropriately. I have discussed this with her surgeon released the surgeons office and that subsequently he is out of town he will be back on this coming Monday after the Thanksgiving holiday. Electronic Signature(s) Signed: 11/20/2021 4:46:27 PM By: [name] PA-C Entered By: [name] on 11/20/2021 16:46:27 Mehlberg, Domingo Pulse ([id number]) -------------------------------------------------------------------------------- Physician Orders Details Patient Name: Prowse, Taelar C. Date of Service: 11/20/2021 12:30 PM Medical Record Number: [id number] Patient Account Number:  [id number] Date of Birth/Sex: 1961-03-28 (60 y.o. 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 o Nurse Visit as needed - nurse visit on thursday Home Health o [facility] for wound care. May utilize formulary equivalent dressing for wound treatment orders unless otherwise specified. Home Health Nurse may visit PRN to address patientos wound care needs. - 3 times per week [facility] fax [phone] Bathing/ Shower/ Hygiene o May shower; gently cleanse wound with antibacterial soap, rinse and pat dry prior to dressing wounds Edema Control - Lymphedema / Segmental Compressive Device / Other o Elevate, Exercise Daily and Avoid Standing for Long  Periods of Time. o Elevate legs to the level of the heart and pump ankles as often as possible o Elevate leg(s) parallel to the floor when sitting. Wound Treatment Wound #1 - Back Wound Laterality: Midline, Distal Cleanser: Normal Saline 1 x Per Day/30 Days Discharge Instructions: Wash your hands with soap and water. Remove old dressing, discard into plastic bag and place into trash. Cleanse the wound with Normal Saline prior to applying a clean dressing using gauze sponges, not tissues or cotton balls. Do not scrub or use excessive force. Pat dry using gauze sponges, not tissue or cotton balls. 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 ([id number]) -------------------------------------------------------------------------------- Problem List Details Patient Name: Pasch, Arianah C. Date of Service: 11/20/2021 12:30 PM Medical Record Number: [id number] Patient Account Number: [id number] Date of Birth/Sex: 09-30-1961 (60 y.o. 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 ([id number]) -------------------------------------------------------------------------------- Progress Note Details Patient Name: Sassaman, Winnifred C. Date of Service: 11/20/2021 12:30 PM Medical Record Number: [id number] Patient Account Number: [id number] Date of Birth/Sex: 1961-10-04 (60 y.o. F) Treating RN: Yevonne Pax Primary Care Provider: [name] Other Clinician: Referring Provider: Card, John Treating Provider/Extender: Rowan Blase in Treatment: 5 Subjective Chief Complaint Information obtained from Patient Surgical Back Ulcer History of Present Illness (HPI) 10/15/2021 upon evaluation today patient presents for initial evaluation here in the clinic concerning a surgical ulceration/dehiscence in the lumbar spine region following surgery that she had over the past year. This was actually broken up into 3 separate surgical events. The initial surgical intervention actually was on November 05, 2021 almost a year ago. Subsequently the patient went back in February for a seroma of the area which unfortunately required her to have a repeat surgery to go in and clean this out. And then again this occurred in April where she went back in and again they felt like stitches were coming out and there was an additional seroma. She was placed in a wound VAC initially and then subsequently as it got smaller that was discontinued. Again right now I will see anything that I think a wound VAC would help with. Nonetheless she definitely has a significant depth to the wound that is going require packing. I actually believe the Hydrofera Blue rope would probably do quite well with this the problem is as much as it is draining she probably needs this to  be changed at least every day. She does not really have anyone that can help with that that is the complicating scenario here. With that being said the patient does have a history of multiple sclerosis, hypertension, and again this surgical wound dehiscence in regard to her lumbar spine region. She did have a repeat MRI which was actually completed 10/09/2021. This showed that there was no significant change in the subcutaneous fluid collection/track of the lower lumbar region. This is extending to the level of the fascia unfortunately. This seems to go all the  way from the L2 level with a track extending all the way to the fascia at the L4-5 level. Again this is a significant wound and there is significant drainage but does not seem to communicate to the spinal region as far as spinal fluid or otherwise is concerned that is good news. Nonetheless she last saw Dr. Franky Macho who is her neurosurgeon on 10/01/2021 that was when he ordered this last MRI she supposed to see him next week as well. With that being said he did not feel like there was any significant issue there but was not sure why this was not healing that is when he ordered the MRI. They were wanting to make sure that this was packed appropriately by home health unfortunately the main issue currently is that home health is completely out of the picture as the patient has exhausted all the home health that that she gets for a year. She is now in a very difficult predicament where she does not have anyone to help her change the dressing and to be honest that she is not able to do it herself with the location of the wound being on the midline lumbar spine region. If she does not have anyone that can help it is probably can to be necessary for her to go to a facility for rehab and daily dressing changes as I feel like daily changes which is much drainage that she is having is going to be necessary. 10/23/2021 upon evaluation today patient appears to be doing decently well in regard to her back ulcer. This does seem to be draining a lot less than what it is been doing in the past. With that being said she still has quite a bit of drainage nonetheless. I do think that given time this should improve least I hope so. The good news is she does have home health coming out 3 days a week were doing it 2 days a week and she is paying someone we can to help. 10/30/2021 upon evaluation today patient appears to be doing okay in regard to her back ulcer this is not draining quite as bad as it was in the beginning but  he still has quite a bit of drainage noted. I do believe that the patient would benefit from [place] going ahead forward with attempting a wound VAC using the Hydrofera Blue rope to pack with and then subsequently using the VAC externally to actually suction out and help this to fill- in. I think this is our ideal way to try to get things cleared at this point. As it stands I am not certain that we are really making a progress that we want to see near with doing it in the way we are which is packing with the rope. It is a good dressing but I do think it is insufficient for total healing. She just seems to have too much in the way of drainage  at this point unfortunately. 11/06/2021 upon evaluation today patient unfortunately continues to have issues with her back ongoing. The good news is her MRI that was repeated showed signs of the size of this area in the lumbar spine region having decreased from 4 cm to 3.5 cm this is definitely not bad news at all. With that being said unfortunately she continues to have issues with ongoing drainage not as severe as in the beginning but nonetheless still significant. I do think a wound VAC still would be a good way to go although her home health agency nurse apparently has some concerns about the possibility of not being able to keep a seal with this as they had struggles in the past. Nonetheless I explained to the patient that this is much different than what she had previous and that I really feel like it would do much better as far as getting the area taken care of without having any complications or issues here. I think that we should be able to maintain a seal. Nonetheless at this time I did discuss with the patient as well that she probably does need to have a wound VAC in order for [place] to get this moving in the right direction. 11/13/2021 upon evaluation patient's wound bed actually showed signs of significant drainage at this time. She did see the surgeon yesterday  he did not see anything that appeared to be infected. Nonetheless he does appear that she is continuing to have areas here that just do not seem to want to seal up there MRI findings have been negative but nonetheless she continues to have is the seroma that is filling in. I do feel like we need to try to widen the hole so we can get at least a half of the [facility] then this will be better than nothing at this point. 11/20/2021 upon evaluation today patient actually appears to be having less pain at this point which is good news and overall she we still do not have the results of the culture back yet it had to be sent out to [facility] and we do not have the result back yet. Is doing decently well in regard to her wound. Fortunately there does not appear to be any signs of active infection systemically nor locally at this time. Schleich, Mehgan C. ([id number]) Objective Constitutional Well-nourished and well-hydrated in no acute distress. Vitals Time Taken: 12:43 PM, Height: 63 in, Weight: 275 lbs, BMI: 48.7, Temperature: 98.2 F, Pulse: 102 bpm, Respiratory Rate: 18 breaths/min, Blood Pressure: 162/98 mmHg. Respiratory normal breathing without difficulty. Psychiatric this patient is able to make decisions and demonstrates good insight into disease process. Alert and Oriented x 3. pleasant and cooperative. General Notes: Upon inspection patient's wound bed actually still appears to be quite open as far as the actual opening into the wound is concerned and I do not see any signs of infection at this time systemically though likely due to still cloudy drainage here that has been a little bit concerned. There is still significant undermining that goes at the 12:00 location down to the 6:00 location counterclockwise. Clockwise from 12 down to 6 this does not appear to be showing any signs of issue here. Overall I am concerned about the fact that we are having trouble getting this area to heal  appropriately. I have discussed this with her surgeon released the surgeons office and that subsequently he is out of town he will be back on this coming Monday after  the Thanksgiving holiday. Integumentary (Hair, Skin) Wound #1 status is Open. Original cause of wound was Surgical Injury. The date acquired was: 04/11/2021. The wound has been in treatment 5 weeks. The wound is located on the Distal,Midline Back. The wound measures 0.7cm length x 0.4cm width x 4.7cm depth; 0.22cm^2 area and 1.034cm^3 volume. There is Fat Layer (Subcutaneous Tissue) exposed. There is no tunneling noted, however, there is undermining starting at 12:00 and ending at 6:00 with a maximum distance of 3.5cm. There is a large amount of serosanguineous drainage noted. There is large (67- 100%) red granulation within the wound bed. There is no necrotic tissue within the wound bed. 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 Nurse Visit as needed - nurse visit on thursday Home Health: [facility] for wound care. May utilize formulary equivalent dressing for wound treatment orders unless otherwise specified. Home Health Nurse may visit PRN to address patient s wound care needs. - 3 times per week [facility] fax [phone] Bathing/ Shower/ Hygiene: May shower; gently cleanse wound with antibacterial soap, rinse and pat dry prior to dressing wounds Edema Control - Lymphedema / Segmental Compressive Device / Other: Elevate, Exercise Daily and Avoid Standing for Long Periods of Time. Elevate legs to the level of the heart and pump ankles as often as possible Elevate leg(s) parallel to the floor when sitting. WOUND #1: - Back Wound Laterality: Midline,  Distal Cleanser: Normal Saline 1 x Per Day/30 Days Discharge Instructions: Wash your hands with soap and water. Remove old dressing, discard into plastic bag and place into trash. Cleanse the wound with Normal Saline prior to applying a clean dressing using gauze sponges, not tissues or cotton balls. Do not scrub or use excessive force. Pat dry using gauze sponges, not tissue or cotton balls. Primary Dressing: Hydrofera Blue Classic Foam Rope Dressing, 9x6 (mm/in) 1 x Per Day/30 Days Secondary Dressing: Zetuvit Plus Silicone Non-bordered 5x5 (in/in) 1 x Per Day/30 Days 1. Would recommend currently that we go ahead and continue with the wound care measures as before specifically with regard to the [facility] Blue rope which I think is still probably our best option here. Hapke, Lamesha C. ([id number]) 2. I am also can recommend that we have the patient continue to monitor for any signs of worsening or infection with regard to the wound. Obviously as soon as we get the results back from the culture we can then see if there is anything needs to be done with regard to infection at that point. We will see patient back for reevaluation in 1 week here in the clinic. If anything worsens or changes patient will contact our office for additional recommendations. Electronic Signature(s) Signed: 11/20/2021 4:47:08 PM By: [name] PA-C Entered By: [name] on 11/20/2021 16:47:08 Bockrath, Domingo Pulse ([id number]) -------------------------------------------------------------------------------- SuperBill Details Patient Name: Kirchgessner, Larisha C. Date of Service: 11/20/2021 Medical Record Number: [id number] Patient Account Number: [id number] Date of Birth/Sex: 1961-09-20 (60 y.o. 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 G35 Multiple sclerosis I10 Essential (primary) hypertension Facility Procedures CPT4  Code: [id number] Description: 88502 - WOUND CARE VISIT-LEV 2 EST PT Modifier: Quantity: 1 Physician Procedures CPT4 Code: [id number] Description: 99214 - WC PHYS LEVEL 4 - EST PT Modifier: Quantity: 1 CPT4 Code: Description: ICD-10 Diagnosis Description T81.31XA Disruption of external operation (surgical) wound, not elsewhere classifi 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

## 2021-11-27 ENCOUNTER — Other Ambulatory Visit: Payer: Self-pay

## 2021-11-27 ENCOUNTER — Encounter: Payer: 59 | Admitting: Physician Assistant

## 2021-11-27 DIAGNOSIS — T8131XA Disruption of external operation (surgical) wound, not elsewhere classified, initial encounter: Secondary | ICD-10-CM | POA: Diagnosis not present

## 2021-11-27 NOTE — Progress Notes (Addendum)
BRIGETT, ESTELL ([id number]) Visit Report for 11/27/2021 Chief Complaint Document Details Patient Name: [name], [name] Date of Service: 11/27/2021 12:30 PM Medical Record Number: [id number] Patient Account Number: [id number] Date of Birth/Sex: 1961-09-01 (60 y.o. F) Treating RN: Yevonne Pax Primary Care Provider: [name] Other Clinician: Referring Provider: Card, John Treating Provider/Extender: Rowan Blase in Treatment: 6 Information Obtained from: Patient Chief Complaint Surgical Back Ulcer Electronic Signature(s) Signed: 11/27/2021 12:55:07 PM By: [name] PA-C Entered By: [name] on 11/27/2021 12:55:07 Haberkorn, Wave CMarland Kitchen ([id number]) -------------------------------------------------------------------------------- HPI Details Patient Name: [name], [name] Date of Service: 11/27/2021 12:30 PM Medical Record Number: [id number] Patient Account Number: [id number] Date of Birth/Sex: 12-05-1961 (60 y.o. F) Treating RN: Yevonne Pax Primary Care Provider: [name] Other Clinician: Referring Provider: Card, John Treating Provider/Extender: Rowan Blase in Treatment: 6 History of Present Illness HPI Description: 10/15/2021 upon evaluation today patient presents for initial evaluation here in the clinic concerning a surgical ulceration/dehiscence in the lumbar spine region following surgery that she had over the past year. This was actually broken up into 3 separate surgical events. The initial surgical intervention actually was on November 05, 2021 almost a year ago. Subsequently the patient went back in February for a seroma of the area which unfortunately required her to have a repeat surgery to go in and clean this out. And then again this occurred in April where she went back in and again they felt like stitches were coming out and there was an additional seroma. She was placed in a wound VAC initially and then subsequently as it got smaller  that was discontinued. Again right now I will see anything that I think a wound VAC would help with. Nonetheless she definitely has a significant depth to the wound that is going require packing. I actually believe the Hydrofera Blue rope would probably do quite well with this the problem is as much as it is draining she probably needs this to be changed at least every day. She does not really have anyone that can help with that that is the complicating scenario here. With that being said the patient does have a history of multiple sclerosis, hypertension, and again this surgical wound dehiscence in regard to her lumbar spine region. She did have a repeat MRI which was actually completed 10/09/2021. This showed that there was no significant change in the subcutaneous fluid collection/track of the lower lumbar region. This is extending to the level of the fascia unfortunately. This seems to go all the way from the L2 level with a track extending all the way to the fascia at the L4-5 level. Again this is a significant wound and there is significant drainage but does not seem to communicate to the spinal region as far as spinal fluid or otherwise is concerned that is good news. Nonetheless she last saw Dr. Franky Macho who is her neurosurgeon on 10/01/2021 that was when he ordered this last MRI she supposed to see him next week as well. With that being said he did not feel like there was any significant issue there but was not sure why this was not healing that is when he ordered the MRI. They were wanting to make sure that this was packed appropriately by home health unfortunately the main issue currently is that home health is completely out of the picture as the patient has exhausted all the home health that that she gets for a year. She is now in a very difficult  predicament where she does not have anyone to help her change the dressing and to be honest that she is not able to do it herself with the location  of the wound being on the midline lumbar spine region. If she does not have anyone that can help it is probably can to be necessary for her to go to a facility for rehab and daily dressing changes as I feel like daily changes which is much drainage that she is having is going to be necessary. 10/23/2021 upon evaluation today patient appears to be doing decently well in regard to her back ulcer. This does seem to be draining a lot less than what it is been doing in the past. With that being said she still has quite a bit of drainage nonetheless. I do think that given time this should improve least I hope so. The good news is she does have home health coming out 3 days a week were doing it 2 days a week and she is paying someone we can to help. 10/30/2021 upon evaluation today patient appears to be doing okay in regard to her back ulcer this is not draining quite as bad as it was in the beginning but he still has quite a bit of drainage noted. I do believe that the patient would benefit from [place] going ahead forward with attempting a wound VAC using the Hydrofera Blue rope to pack with and then subsequently using the VAC externally to actually suction out and help this to fill- in. I think this is our ideal way to try to get things cleared at this point. As it stands I am not certain that we are really making a progress that we want to see near with doing it in the way we are which is packing with the rope. It is a good dressing but I do think it is insufficient for total healing. She just seems to have too much in the way of drainage at this point unfortunately. 11/06/2021 upon evaluation today patient unfortunately continues to have issues with her back ongoing. The good news is her MRI that was repeated showed signs of the size of this area in the lumbar spine region having decreased from 4 cm to 3.5 cm this is definitely not bad news at all. With that being said unfortunately she continues to have  issues with ongoing drainage not as severe as in the beginning but nonetheless still significant. I do think a wound VAC still would be a good way to go although her home health agency nurse apparently has some concerns about the possibility of not being able to keep a seal with this as they had struggles in the past. Nonetheless I explained to the patient that this is much different than what she had previous and that I really feel like it would do much better as far as getting the area taken care of without having any complications or issues here. I think that we should be able to maintain a seal. Nonetheless at this time I did discuss with the patient as well that she probably does need to have a wound VAC in order for [place] to get this moving in the right direction. 11/13/2021 upon evaluation patient's wound bed actually showed signs of significant drainage at this time. She did see the surgeon yesterday he did not see anything that appeared to be infected. Nonetheless he does appear that she is continuing to have areas here that just do not seem  to want to seal up there MRI findings have been negative but nonetheless she continues to have is the seroma that is filling in. I do feel like we need to try to widen the hole so we can get at least a half of the [facility] then this will be better than nothing at this point. 11/20/2021 upon evaluation today patient actually appears to be having less pain at this point which is good news and overall she we still do not have the results of the culture back yet it had to be sent out to [facility] and we do not have the result back yet. Is doing decently well in regard to her wound. Fortunately there does not appear to be any signs of active infection systemically nor locally at this time. 11/27/2021 upon evaluation today patient appears to be doing well with regard to her wound all things considered there does appear to be less drainage than there was  previous. Fortunately I do not see any evidence of worsening of the patient is stating that she is having some issues with back pain. This is somewhat new. Again this I think could be related to the fact that she is having some issues here with infection. We are still waiting to see what the result of her culture shows from susceptibility testing Enterococcus has been identified but we do not know if this is VRE or not. Electronic Signature(s) Signed: 11/27/2021 1:21:52 PM By: [name] PA-C Entered By: [name] on 11/27/2021 13:21:52 Tucci, DASIA GUERRIER ([id number]) Schweppe, Onyinyechi Salena Saner ([id number]) -------------------------------------------------------------------------------- Physical Exam Details Patient Name: Pitz, Rucha C. Date of Service: 11/27/2021 12:30 PM Medical Record Number: [id number] Patient Account Number: [id number] Date of Birth/Sex: Jun 23, 1961 (60 y.o. F) Treating RN: Yevonne Pax Primary Care Provider: [name] Other Clinician: Referring Provider: Card, John Treating Provider/Extender: Rowan Blase in Treatment: 6 Constitutional Obese and well-hydrated in no acute distress. Respiratory normal breathing without difficulty. Psychiatric this patient is able to make decisions and demonstrates good insight into disease process. Alert and Oriented x 3. pleasant and cooperative. Notes Upon inspection patient's wound bed actually showed signs of staying open as far as the ulcer is concerned. Unfortunately internally it still feels like things are about the same there does not appear to be any significant improvements unfortunately. Nonetheless I do believe that I am still worried about the fact that this very well may not heal without additional surgical intervention. Again this is somewhat cumbersome for the patient and I completely understand that but I do believe that she is improving and the fact is not having quite as much drainage but still she  is having increased pain again I am uncertain if this is all related to infection or what is going on. Electronic Signature(s) Signed: 11/27/2021 1:22:40 PM By: [name] PA-C Entered By: [name] on 11/27/2021 13:22:40 Parkey, Domingo Pulse ([id number]) -------------------------------------------------------------------------------- Physician Orders Details Patient Name: Mines, Keyosha C. Date of Service: 11/27/2021 12:30 PM Medical Record Number: [id number] Patient Account Number: [id number] Date of Birth/Sex: 1961-08-05 (60 y.o. 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 o Nurse Visit as needed - nurse visit on thursday Home Health o [facility] for wound care. May utilize formulary equivalent dressing for wound treatment orders unless otherwise specified. Home Health Nurse may visit PRN to address patientos wound care needs. - 3 times per week [facility] fax [phone] Bathing/ Shower/ Hygiene o May shower; gently cleanse wound with antibacterial soap, rinse and pat dry prior to dressing wounds Edema Control - Lymphedema / Segmental Compressive Device / Other o Elevate, Exercise Daily and Avoid Standing for Long Periods of Time. o Elevate legs to the level of the heart and pump ankles as often as possible o Elevate leg(s) parallel to the floor when sitting. Wound Treatment Wound #1 - Back Wound Laterality: Midline, Distal Cleanser: Normal Saline 1 x Per Day/30  Days Discharge Instructions: Wash your hands with soap and water. Remove old dressing, discard into plastic bag and place into trash. Cleanse the wound with Normal Saline prior to applying a clean dressing using gauze sponges, not tissues or cotton balls. Do not scrub or use excessive force. Pat dry using gauze sponges, not tissue or cotton balls. Primary Dressing: Hydrofera Blue Classic Foam Rope Dressing, 9x6 (mm/in) 1 x Per Day/30 Days Secondary Dressing: Zetuvit Plus Silicone Non-bordered 5x5 (in/in) 1 x Per Day/30 Days Electronic Signature(s) Signed: 11/27/2021 4:57:43 PM By: [name] PA-C Signed: 11/28/2021 2:51:09 PM By: Yevonne Pax RN Entered By: Yevonne Pax on 11/27/2021 13:04:06 Madara, Jacky C. ([id number]) -------------------------------------------------------------------------------- Problem List Details Patient Name: Babers, Halle C. Date of Service: 11/27/2021 12:30 PM Medical Record Number: [id number] Patient Account Number: [id number] Date of Birth/Sex: [birth year] (60 y.o. 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 ([id number]) -------------------------------------------------------------------------------- Progress Note Details Patient Name: Cordova, Ezrie  C. Date of Service: 11/27/2021 12:30 PM Medical Record Number: [id number] Patient Account Number: [id number] Date of Birth/Sex: [birth year] (60 y.o. F) Treating RN: Yevonne Pax Primary Care Provider: [name] Other Clinician: Referring Provider: Card, John Treating Provider/Extender: Rowan Blase in Treatment: 6 Subjective Chief Complaint Information obtained from Patient Surgical Back Ulcer History of Present Illness (HPI) 10/15/2021 upon evaluation today patient presents for initial evaluation here in the clinic concerning a surgical ulceration/dehiscence in the lumbar spine region following surgery that she had over the past year. This was actually broken up into 3 separate surgical events. The initial surgical intervention actually was on November 05, 2021 almost a year ago. Subsequently the patient went back in February for a seroma of the area which unfortunately required her to have a repeat surgery to go in and clean this out. And then again this occurred in April where she went back in and again they felt like stitches were coming out and there was an additional seroma. She was placed in a wound VAC initially and then subsequently as it got smaller that was discontinued. Again right now I will see anything that I think a wound VAC would help with. Nonetheless she  definitely has a significant depth to the wound that is going require packing. I actually believe the Hydrofera Blue rope would probably do quite well with this the problem is as much as it is draining she probably needs this to be changed at least every day. She does not really have anyone that can help with that that is the complicating scenario here. With that being said the patient does have a history of multiple sclerosis, hypertension, and again this surgical wound dehiscence in regard to her lumbar spine region. She did have a repeat MRI which was actually completed 10/09/2021. This showed that there was no  significant change in the subcutaneous fluid collection/track of the lower lumbar region. This is extending to the level of the fascia unfortunately. This seems to go all the way from the L2 level with a track extending all the way to the fascia at the L4-5 level. Again this is a significant wound and there is significant drainage but does not seem to communicate to the spinal region as far as spinal fluid or otherwise is concerned that is good news. Nonetheless she last saw Dr. Franky Macho who is her neurosurgeon on 10/01/2021 that was when he ordered this last MRI she supposed to see him next week as well. With that being said he did not feel like there was any significant issue there but was not sure why this was not healing that is when he ordered the MRI. They were wanting to make sure that this was packed appropriately by home health unfortunately the main issue currently is that home health is completely out of the picture as the patient has exhausted all the home health that that she gets for a year. She is now in a very difficult predicament where she does not have anyone to help her change the dressing and to be honest that she is not able to do it herself with the location of the wound being on the midline lumbar spine region. If she does not have anyone that can help it is probably can to be necessary for her to go to a facility for rehab and daily dressing changes as I feel like daily changes which is much drainage that she is having is going to be necessary. 10/23/2021 upon evaluation today patient appears to be doing decently well in regard to her back ulcer. This does seem to be draining a lot less than what it is been doing in the past. With that being said she still has quite a bit of drainage nonetheless. I do think that given time this should improve least I hope so. The good news is she does have home health coming out 3 days a week were doing it 2 days a week and she is paying someone  we can to help. 10/30/2021 upon evaluation today patient appears to be doing okay in regard to her back ulcer this is not draining quite as bad as it was in the beginning but he still has quite a bit of drainage noted. I do believe that the patient would benefit from [place] going ahead forward with attempting a wound VAC using the Hydrofera Blue rope to pack with and then subsequently using the VAC externally to actually suction out and help this to fill- in. I think this is our ideal way to try to get things cleared at this point. As it stands I am not certain that we are really making a progress that we want to see near  with doing it in the way we are which is packing with the rope. It is a good dressing but I do think it is insufficient for total healing. She just seems to have too much in the way of drainage at this point unfortunately. 11/06/2021 upon evaluation today patient unfortunately continues to have issues with her back ongoing. The good news is her MRI that was repeated showed signs of the size of this area in the lumbar spine region having decreased from 4 cm to 3.5 cm this is definitely not bad news at all. With that being said unfortunately she continues to have issues with ongoing drainage not as severe as in the beginning but nonetheless still significant. I do think a wound VAC still would be a good way to go although her home health agency nurse apparently has some concerns about the possibility of not being able to keep a seal with this as they had struggles in the past. Nonetheless I explained to the patient that this is much different than what she had previous and that I really feel like it would do much better as far as getting the area taken care of without having any complications or issues here. I think that we should be able to maintain a seal. Nonetheless at this time I did discuss with the patient as well that she probably does need to have a wound VAC in order for [place] to get  this moving in the right direction. 11/13/2021 upon evaluation patient's wound bed actually showed signs of significant drainage at this time. She did see the surgeon yesterday he did not see anything that appeared to be infected. Nonetheless he does appear that she is continuing to have areas here that just do not seem to want to seal up there MRI findings have been negative but nonetheless she continues to have is the seroma that is filling in. I do feel like we need to try to widen the hole so we can get at least a half of the [facility] then this will be better than nothing at this point. 11/20/2021 upon evaluation today patient actually appears to be having less pain at this point which is good news and overall she we still do not have the results of the culture back yet it had to be sent out to [facility] and we do not have the result back yet. Is doing decently well in regard to her wound. Fortunately there does not appear to be any signs of active infection systemically nor locally at this time. 11/27/2021 upon evaluation today patient appears to be doing well with regard to her wound all things considered there does appear to be less drainage than there was previous. Fortunately I do not see any evidence of worsening of the patient is stating that she is having some issues with back pain. This is somewhat new. Again this I think could be related to the fact that she is having some issues here with infection. We are still waiting to see what the result of her culture shows from susceptibility testing Enterococcus has been identified but we do not know if this is VRE or not. Jodoin, Shambria C. ([id number]) Objective Constitutional Obese and well-hydrated in no acute distress. Vitals Time Taken: 12:45 PM, Height: 63 in, Weight: 275 lbs, BMI: 48.7, Temperature: 98.3 F, Pulse: 79 bpm, Respiratory Rate: 18 breaths/min, Blood Pressure: 133/84 mmHg. Respiratory normal breathing without  difficulty. Psychiatric this patient is able to make decisions and demonstrates  good insight into disease process. Alert and Oriented x 3. pleasant and cooperative. General Notes: Upon inspection patient's wound bed actually showed signs of staying open as far as the ulcer is concerned. Unfortunately internally it still feels like things are about the same there does not appear to be any significant improvements unfortunately. Nonetheless I do believe that I am still worried about the fact that this very well may not heal without additional surgical intervention. Again this is somewhat cumbersome for the patient and I completely understand that but I do believe that she is improving and the fact is not having quite as much drainage but still she is having increased pain again I am uncertain if this is all related to infection or what is going on. Integumentary (Hair, Skin) Wound #1 status is Open. Original cause of wound was Surgical Injury. The date acquired was: 04/11/2021. The wound has been in treatment 6 weeks. The wound is located on the Distal,Midline Back. The wound measures 0.7cm length x 0.4cm width x 4.2cm depth; 0.22cm^2 area and 0.924cm^3 volume. There is Fat Layer (Subcutaneous Tissue) exposed. There is no tunneling or undermining noted. There is a large amount of serosanguineous drainage noted. There is large (67-100%) red granulation within the wound bed. There is no necrotic tissue within the wound bed. 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 Nurse Visit as needed - nurse visit on thursday Home Health: [facility] for [facility] care. May utilize formulary equivalent dressing for wound treatment orders unless  otherwise specified. Home Health Nurse may visit PRN to address patient s wound care needs. - 3 times per week [facility] fax [phone] Bathing/ Shower/ Hygiene: May shower; gently cleanse wound with antibacterial soap, rinse and pat dry prior to dressing wounds Edema Control - Lymphedema / Segmental Compressive Device / Other: Elevate, Exercise Daily and Avoid Standing for Long Periods of Time. Elevate legs to the level of the heart and pump ankles as often as possible Elevate leg(s) parallel to the floor when sitting. WOUND #1: - Back Wound Laterality: Midline, Distal Cleanser: Normal Saline 1 x Per Day/30 Days Discharge Instructions: Wash your hands with soap and water. Remove old dressing, discard into plastic bag and place into trash. Cleanse the wound with Normal Saline prior to applying a clean dressing using gauze sponges, not tissues or cotton balls. Do not scrub or use excessive force. Pat dry using gauze sponges, not tissue or cotton balls. Primary Dressing: Hydrofera Blue Classic Foam Rope Dressing, 9x6 (mm/in) 1 x Per Day/30 Days Secondary Dressing: Zetuvit Plus Silicone Non-bordered 5x5 (in/in) 1 x Per Day/30 Days Chauca, Liza C. ([id number]) 1. I would recommend currently that we going to continue with the wound care measures as before and the patient is in agreement the plan. This includes the use of the Hydrofera Blue rope which I think is still the best way to go. 2. I am also can recommend that we have the patient continue with the saline to moisten the rope once it is put in. 3. I am also can recommend we continue to change this frequently I think that is going to still be the best way to go right now I do not think a wound VAC is really good to be efficient at this point. I think it may cause more harm than  good if we were to attempt this. 4. Soon as I get the results back from the culture I am going to send this over to neurosurgery and I will give them a call as well  and see where things stand and what we need to do. We will see patient back for reevaluation in 1 week here in the clinic. If anything worsens or changes patient will contact our office for additional recommendations. Electronic Signature(s) Signed: 11/27/2021 1:23:48 PM By: [name] PA-C Entered By: [name] on 11/27/2021 13:23:47 Deharo, Domingo Pulse ([id number]) -------------------------------------------------------------------------------- SuperBill Details Patient Name: Folker, Kiyomi C. Date of Service: 11/27/2021 Medical Record Number: [id number] Patient Account Number: [id number] Date of Birth/Sex: 01-Jun-1961 (60 y.o. 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 G35 Multiple sclerosis I10 Essential (primary) hypertension Facility Procedures CPT4 Code: 08657846 Description: 99213 - WOUND CARE VISIT-LEV 3 EST PT Modifier: Quantity: 1 Physician Procedures CPT4 Code: 9629528 Description: 99214 - WC PHYS LEVEL 4 - EST PT Modifier: Quantity: 1 CPT4 Code: Description: ICD-10 Diagnosis Description T81.31XA Disruption of external operation (surgical) wound, not elsewhere classifi 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

## 2021-11-28 LAB — SUSCEPTIBILITY RESULT

## 2021-11-28 LAB — SUSCEPTIBILITY, AER + ANAEROB: Source of Sample: 8680

## 2021-11-28 NOTE — Progress Notes (Signed)
TARAOLUWA, THAKUR ([id number]) Visit Report for 11/27/2021 Arrival Information Details Patient Name: Altman, HARTLEIGH EDMONSTON. Date of Service: 11/27/2021 12:30 PM Medical Record Number: [id number] Patient Account Number: [id number] Date of Birth/Sex: [birth year] (60 y.o. F) Treating RN: Yevonne Pax Primary Care Araminta Zorn: Card, Jonny Ruiz Other Clinician: Referring Karson Chicas: Card, John Treating Maisee Vollman/Extender: Rowan Blase in Treatment: 6 Visit Information History Since Last Visit All ordered tests and consults were completed: No Patient Arrived: Ambulatory Added or deleted any medications: No Arrival Time: 12:37 Any new allergies or adverse reactions: No Accompanied By: self Had a fall or experienced change in No Transfer Assistance: None activities of daily living that may affect Patient Identification Verified: Yes risk of falls: Secondary Verification Process Completed: Yes Signs or symptoms of abuse/neglect since last visito No Patient Requires Transmission-Based Precautions: No Hospitalized since last visit: No Patient Has Alerts: No Implantable device outside of the clinic excluding No cellular tissue based products placed in the center since last visit: Has Dressing in Place as Prescribed: Yes Pain Present Now: Yes Electronic Signature(s) Signed: 11/28/2021 2:51:09 PM By: Yevonne Pax RN Entered By: Yevonne Pax on 11/27/2021 12:45:14 Corson, Shreshta CMarland Kitchen ([id number]) -------------------------------------------------------------------------------- Clinic Level of Care Assessment Details Patient Name: Upton, Devonne C. Date of Service: 11/27/2021 12:30 PM Medical Record Number: [id number] Patient Account Number: [id number] Date of Birth/Sex: [birth year] (60 y.o. F) Treating RN: Yevonne Pax Primary Care Joetta Delprado: Card, Jonny Ruiz Other Clinician: Referring Maddyson Keil: Card, John Treating Michaila Kenney/Extender: Rowan Blase in Treatment: 6 Clinic Level of Care Assessment  Items TOOL 4 Quantity Score X - Use when only an EandM is performed on FOLLOW-UP visit 1 0 ASSESSMENTS - Nursing Assessment / Reassessment X - Reassessment of Co-morbidities (includes updates in patient status) 1 10 X- 1 5 Reassessment of Adherence to Treatment Plan ASSESSMENTS - Wound and Skin Assessment / Reassessment X - Simple Wound Assessment / Reassessment - one wound 1 5 []  - 0 Complex Wound Assessment / Reassessment - multiple wounds []  - 0 Dermatologic / Skin Assessment (not related to wound area) ASSESSMENTS - Focused Assessment []  - Circumferential Edema Measurements - multi extremities 0 []  - 0 Nutritional Assessment / Counseling / Intervention []  - 0 Lower Extremity Assessment (monofilament, tuning fork, pulses) []  - 0 Peripheral Arterial Disease Assessment (using hand held doppler) ASSESSMENTS - Ostomy and/or Continence Assessment and Care []  - Incontinence Assessment and Management 0 []  - 0 Ostomy Care Assessment and Management (repouching, etc.) PROCESS - Coordination of Care X - Simple Patient / Family Education for ongoing care 1 15 []  - 0 Complex (extensive) Patient / Family Education for ongoing care X- 1 10 Staff obtains , Records, Test Results / Process Orders []  - 0 Staff telephones HHA, Nursing Homes / Clarify orders / etc []  - 0 Routine Transfer to another Facility (non-emergent condition) []  - 0 Routine Hospital Admission (non-emergent condition) []  - 0 New Admissions / / Ordering NPWT, Apligraf, etc. []  - 0 Emergency Hospital Admission (emergent condition) X- 1 10 Simple Discharge Coordination []  - 0 Complex (extensive) Discharge Coordination PROCESS - Special Needs []  - Pediatric / Minor Patient Management 0 []  - 0 Isolation Patient Management []  - 0 Hearing / Language / Visual special needs []  - 0 Assessment of Community assistance (transportation, D/C planning, etc.) []  - 0 Additional assistance /  Altered mentation []  - 0 Support Surface(s) Assessment (bed, cushion, seat, etc.) INTERVENTIONS - Wound Cleansing / Measurement Ruppert, Delesia C. ( ) X- 1 5 Simple Wound Cleansing -  one wound []  - 0 Complex Wound Cleansing - multiple wounds X- 1 5 Wound Imaging (photographs - any number of wounds) []  - 0 Wound Tracing (instead of photographs) X- 1 5 Simple Wound Measurement - one wound []  - 0 Complex Wound Measurement - multiple wounds INTERVENTIONS - Wound Dressings X - Small Wound Dressing one or multiple wounds 1 10 []  - 0 Medium Wound Dressing one or multiple wounds []  - 0 Large Wound Dressing one or multiple wounds []  - 0 Application of Medications - topical []  - 0 Application of Medications - injection INTERVENTIONS - Miscellaneous []  - External ear exam 0 []  - 0 Specimen Collection (cultures, biopsies, blood, body fluids, etc.) []  - 0 Specimen(s) / Culture(s) sent or taken to Lab for analysis []  - 0 Patient Transfer (multiple staff / Nurse, adult / Similar devices) []  - 0 Simple Staple / Suture removal (25 or less) []  - 0 Complex Staple / Suture removal (26 or more) []  - 0 Hypo / Hyperglycemic Management (close monitor of Blood Glucose) []  - 0 Ankle / Brachial Index (ABI) - do not check if billed separately X- 1 5 Vital Signs Has the patient been seen at the hospital within the last three years: Yes Total Score: 85 Level Of Care: New/Established - Level 3 Electronic Signature(s) Signed: 11/28/2021 2:51:09 PM By: Yevonne Pax RN Entered By: Yevonne Pax on 11/27/2021 13:05:09 Lim, Dnya CMarland Kitchen ([id number]) -------------------------------------------------------------------------------- Encounter Discharge Information Details Patient Name: Rogala, Laiba C. Date of Service: 11/27/2021 12:30 PM Medical Record Number: [id number] Patient Account Number: [id number] Date of Birth/Sex: Feb 19, 1961 (60 y.o. F) Treating RN: Yevonne Pax Primary Care  Jasenia Weilbacher: [name] Other Clinician: Referring Zadia Uhde: Card, John Treating Alexzandra Bilton/Extender: Rowan Blase in Treatment: 6 Encounter Discharge Information Items Discharge Condition: Stable Ambulatory Status: Ambulatory Discharge Destination: Home Transportation: Private Auto Accompanied By: self Schedule Follow-up Appointment: Yes Clinical Summary of Care: Patient Declined Electronic Signature(s) Signed: 11/28/2021 2:51:09 PM By: Yevonne Pax RN Entered By: Yevonne Pax on 11/27/2021 13:06:23 Soland, Briawna C. ([id number]) -------------------------------------------------------------------------------- Lower Extremity Assessment Details Patient Name: Waterworth, Eriyana C. Date of Service: 11/27/2021 12:30 PM Medical Record Number: [id number] Patient Account Number: [id number] Date of Birth/Sex: 16-Sep-1961 (60 y.o. F) Treating RN: Yevonne Pax Primary Care Treyton Slimp: [name] Other Clinician: Referring Zoe Creasman: Card, John Treating Ember Gottwald/Extender: Rowan Blase in Treatment: 6 Electronic Signature(s) Signed: 11/28/2021 2:51:09 PM By: Yevonne Pax RN Entered By: Yevonne Pax on 11/27/2021 12:51:36 Chalfant, Misako CMarland Kitchen ([id number]) -------------------------------------------------------------------------------- Multi Wound Chart Details Patient Name: Sanjose, Azaylah C. Date of Service: 11/27/2021 12:30 PM Medical Record Number: [id number] Patient Account Number: [id number] Date of Birth/Sex: 06-Jan-1961 (60 y.o. F) Treating RN: Yevonne Pax Primary Care Shemicka Cohrs: [name] Other Clinician: Referring Mirage Pfefferkorn: Card, John Treating Deniesha Stenglein/Extender: Rowan Blase in Treatment: 6 Vital Signs Height(in): 63 Pulse(bpm): 79 Weight(lbs): 275 Blood Pressure(mmHg): 133/84 Body Mass Index(BMI): 49 Temperature(F): 98.3 Respiratory Rate(breaths/min): 18 Photos: [N/A:N/A] Wound Location: Distal, Midline Back N/A N/A Wounding Event: Surgical Injury N/A N/A Primary  Etiology: Dehisced Wound N/A N/A Comorbid History: Hypertension N/A N/A Date Acquired: 04/11/2021 N/A N/A Weeks of Treatment: 6 N/A N/A Wound Status: Open N/A N/A Measurements L x W x D (cm) 0.7x0.4x4.2 N/A N/A Area (cm) : 0.22 N/A N/A Volume (cm) : 0.924 N/A N/A % Reduction in Area: -74.60% N/A N/A % Reduction in Volume: -59.90% N/A N/A Classification: Full Thickness Without Exposed N/A N/A Support Structures Exudate Amount: Large N/A N/A Exudate Type: Serosanguineous N/A N/A Exudate Color: red, brown  N/A N/A Granulation Amount: Large (67-100%) N/A N/A Granulation Quality: Red N/A N/A Necrotic Amount: None Present (0%) N/A N/A Exposed Structures: Fat Layer (Subcutaneous Tissue): N/A N/A Yes Fascia: No Tendon: No Muscle: No Joint: No Bone: No Epithelialization: None N/A N/A Treatment Notes Electronic Signature(s) Signed: 11/28/2021 2:51:09 PM By: Yevonne Pax RN Entered By: Yevonne Pax on 11/27/2021 13:03:22 Bradshaw, Khalea Salena Saner ([id number]) -------------------------------------------------------------------------------- Multi-Disciplinary Care Plan Details Patient Name: Read, Georgeanna C. Date of Service: 11/27/2021 12:30 PM Medical Record Number: [id number] Patient Account Number: [id number] Date of Birth/Sex: February 12, 1961 (60 y.o. F) Treating RN: Yevonne Pax Primary Care Donny Heffern: [name] Other Clinician: Referring Vannie Hochstetler: Card, John Treating Angeli Demilio/Extender: Rowan Blase in Treatment: 6 Active Inactive Wound/Skin Impairment Nursing Diagnoses: Knowledge deficit related to ulceration/compromised skin integrity Goals: Patient/caregiver will verbalize understanding of skin care regimen Date Initiated: 10/15/2021 Target Resolution Date: 12/15/2021 Goal Status: Active Ulcer/skin breakdown will have a volume reduction of 30% by week 4 Date Initiated: 10/15/2021 Target Resolution Date: 12/15/2021 Goal Status: Active Ulcer/skin breakdown will have a volume  reduction of 50% by week 8 Date Initiated: 10/15/2021 Target Resolution Date: 01/15/2022 Goal Status: Active Ulcer/skin breakdown will have a volume reduction of 80% by week 12 Date Initiated: 10/15/2021 Target Resolution Date: 02/15/2022 Goal Status: Active Ulcer/skin breakdown will heal within 14 weeks Date Initiated: 10/15/2021 Target Resolution Date: 03/15/2022 Goal Status: Active Interventions: Assess patient/caregiver ability to obtain necessary supplies Assess patient/caregiver ability to perform ulcer/skin care regimen upon admission and as needed Assess ulceration(s) every visit Notes: Electronic Signature(s) Signed: 11/28/2021 2:51:09 PM By: Yevonne Pax RN Entered By: Yevonne Pax on 11/27/2021 13:02:55 Helmstetter, Shalandria C. ([id number]) -------------------------------------------------------------------------------- Pain Assessment Details Patient Name: Tristan, Arleigh C. Date of Service: 11/27/2021 12:30 PM Medical Record Number: [id number] Patient Account Number: [id number] Date of Birth/Sex: June 07, 1961 (60 y.o. F) Treating RN: Yevonne Pax Primary Care Sarit Sparano: [name] Other Clinician: Referring Singleton Hickox: Card, John Treating Melodee Lupe/Extender: Rowan Blase in Treatment: 6 Active Problems Location of Pain Severity and Description of Pain Patient Has Paino Yes Site Locations With Dressing Change: Yes Duration of the Pain. Constant / Intermittento Constant Rate the pain. Current Pain Level: 4 Worst Pain Level: 6 Least Pain Level: 3 Tolerable Pain Level: 5 Character of Pain Describe the Pain: Throbbing Pain Management and Medication Current Pain Management: Medication: Yes Cold Application: No Rest: Yes Massage: No Activity: No T.E.N.S.: No Heat Application: No Leg drop or elevation: No Is the Current Pain Management Adequate: Inadequate How does your wound impact your activities of daily livingo Sleep: No Bathing: No Appetite: No Relationship  With Others: No Bladder Continence: No Emotions: No Bowel Continence: No Work: No Toileting: No Drive: No Dressing: No Hobbies: No Electronic Signature(s) Signed: 11/28/2021 2:51:09 PM By: Yevonne Pax RN Entered By: Yevonne Pax on 11/27/2021 12:47:03 Allerton, Tyanna Salena Saner ([id number]) -------------------------------------------------------------------------------- Patient/Caregiver Education Details Patient Name: Tarr, Sruthi C. Date of Service: 11/27/2021 12:30 PM Medical Record Number: [id number] Patient Account Number: [id number] Date of Birth/Gender: 1961/07/21 (60 y.o. F) Treating RN: Yevonne Pax Primary Care Physician: [name] Other Clinician: Referring Physician: Card, John Treating Physician/Extender: Rowan Blase in Treatment: 6 Education Assessment Education Provided To: Patient Education Topics Provided Wound/Skin Impairment: Methods: Explain/Verbal Responses: State content correctly Electronic Signature(s) Signed: 11/28/2021 2:51:09 PM By: Yevonne Pax RN Entered By: Yevonne Pax on 11/27/2021 13:05:24 Kofoed, Shelia C. ([id number]) -------------------------------------------------------------------------------- Wound Assessment Details Patient Name: Garzon, Charlaine C. Date of Service: 11/27/2021 12:30 PM Medical Record Number: [id number] Patient Account Number: [id number] Date  of Birth/Sex: 04/07/1961 (60 y.o. F) Treating RN: Yevonne Pax Primary Care Aparna Vanderweele: Card, Jonny Ruiz Other Clinician: Referring Emmajean Ratledge: Card, John Treating Sahaana Weitman/Extender: Rowan Blase in Treatment: 6 Wound Status Wound Number: 1 Primary Etiology: Dehisced Wound Wound Location: Distal, Midline Back Wound Status: Open Wounding Event: Surgical Injury Comorbid History: Hypertension Date Acquired: 04/11/2021 Weeks Of Treatment: 6 Clustered Wound: No Photos Wound Measurements Length: (cm) 0.7 Width: (cm) 0.4 Depth: (cm) 4.2 Area: (cm) 0.22 Volume: (cm)  0.924 % Reduction in Area: -74.6% % Reduction in Volume: -59.9% Epithelialization: None Tunneling: No Undermining: No Wound Description Classification: Full Thickness Without Exposed Support Structures Exudate Amount: Large Exudate Type: Serosanguineous Exudate Color: red, brown Foul Odor After Cleansing: No Slough/Fibrino Yes Wound Bed Granulation Amount: Large (67-100%) Exposed Structure Granulation Quality: Red Fascia Exposed: No Necrotic Amount: None Present (0%) Fat Layer (Subcutaneous Tissue) Exposed: Yes Tendon Exposed: No Muscle Exposed: No Joint Exposed: No Bone Exposed: No Treatment Notes Wound #1 (Back) Wound Laterality: Midline, Distal Cleanser Normal Saline Discharge Instruction: Wash your hands with soap and water. Remove old dressing, discard into plastic bag and place into trash. Cleanse the wound with Normal Saline prior to applying a clean dressing using gauze sponges, not tissues or cotton balls. Do not scrub or use excessive force. Pat dry using gauze sponges, not tissue or cotton balls. Pollino, Addysen C. ([id number]) Peri-Wound Care Topical Primary Dressing Hydrofera Blue Classic Foam Rope Dressing, 9x6 (mm/in) Secondary Dressing Zetuvit Plus Silicone Non-bordered 5x5 (in/in) Secured With Compression Wrap Compression Stockings Add-Ons Electronic Signature(s) Signed: 11/28/2021 2:51:09 PM By: Yevonne Pax RN Entered By: Yevonne Pax on 11/27/2021 12:51:20 Boer, Charvi C. ([id number]) -------------------------------------------------------------------------------- Vitals Details Patient Name: Gesell, Lezlee C. Date of Service: 11/27/2021 12:30 PM Medical Record Number: [id number] Patient Account Number: [id number] Date of Birth/Sex: [birth year] (60 y.o. F) Treating RN: Yevonne Pax Primary Care Lauriann Milillo: [name] Other Clinician: Referring Casmir Auguste: Card, John Treating Jakyron Fabro/Extender: Rowan Blase in Treatment: 6 Vital  Signs Time Taken: 12:45 Temperature (F): 98.3 Height (in): 63 Pulse (bpm): 79 Weight (lbs): 275 Respiratory Rate (breaths/min): 18 Body Mass Index (BMI): 48.7 Blood Pressure (mmHg): 133/84 Reference Range: 80 - 120 mg / dl Electronic Signature(s) Signed: 11/28/2021 2:51:09 PM By: Yevonne Pax RN Entered By: Yevonne Pax on 11/27/2021 12:45:44

## 2021-11-29 ENCOUNTER — Encounter: Payer: 59 | Attending: Physician Assistant

## 2021-11-29 ENCOUNTER — Other Ambulatory Visit: Payer: Self-pay

## 2021-11-29 DIAGNOSIS — T8131XA Disruption of external operation (surgical) wound, not elsewhere classified, initial encounter: Secondary | ICD-10-CM | POA: Insufficient documentation

## 2021-11-29 DIAGNOSIS — G35 Multiple sclerosis: Secondary | ICD-10-CM | POA: Diagnosis not present

## 2021-11-29 DIAGNOSIS — L98422 Non-pressure chronic ulcer of back with fat layer exposed: Secondary | ICD-10-CM | POA: Diagnosis not present

## 2021-11-29 DIAGNOSIS — I10 Essential (primary) hypertension: Secondary | ICD-10-CM | POA: Insufficient documentation

## 2021-11-29 NOTE — Progress Notes (Signed)
KYNADI, DRAGOS ([id number]) Visit Report for 11/29/2021 Arrival Information Details Patient Name: [name], [name] Date of Service: 11/29/2021 3:30 PM Medical Record Number: [id number] Patient Account Number: [id number] Date of Birth/Sex: [birth year] (60 y.o. F) Treating RN: Yevonne Pax Primary Care Tyshell Ramberg: Card, Jonny Ruiz Other Clinician: Referring Hadley Detloff: Card, John Treating Satoshi Kalas/Extender: Rowan Blase in Treatment: 6 Visit Information History Since Last Visit All ordered tests and consults were completed: No Patient Arrived: Ambulatory Added or deleted any medications: No Arrival Time: 15:38 Any new allergies or adverse reactions: No Accompanied By: self Had a fall or experienced change in No Transfer Assistance: None activities of daily living that [name] affect Patient Identification Verified: Yes risk of falls: Secondary Verification Process Completed: Yes Signs or symptoms of abuse/neglect since last visito No Patient Requires Transmission-Based Precautions: No Hospitalized since last visit: No Patient Has Alerts: No Implantable device outside of the clinic excluding No cellular tissue based products placed in the center since last visit: Pain Present Now: No Electronic Signature(s) Signed: 11/29/2021 4:59:[name] PM By: Yevonne Pax RN Entered By: Yevonne Pax on 11/29/2021 15:47:03 [place], Asako [name]. ([id number]) -------------------------------------------------------------------------------- Clinic Level of Care Assessment Details Patient Name: [name], [name] Date of Service: 11/29/2021 3:30 PM Medical Record Number: [id number] Patient Account Number: [id number] Date of Birth/Sex: [name], [name] (60 y.o. F) Treating RN: Yevonne Pax Primary Care Kmari Brian: Card, Jonny Ruiz Other Clinician: Referring Lynne Righi: Card, John Treating Raechel Marcos/Extender: Rowan Blase in Treatment: 6 Clinic Level of Care Assessment Items TOOL 4 Quantity Score X - Use when only an  EandM is performed on FOLLOW-UP visit 1 0 ASSESSMENTS - Nursing Assessment / Reassessment X - Reassessment of Co-morbidities (includes updates in patient status) 1 10 X- 1 5 Reassessment of Adherence to Treatment Plan ASSESSMENTS - Wound and Skin Assessment / Reassessment X - Simple Wound Assessment / Reassessment - one wound 1 5 []  - 0 Complex Wound Assessment / Reassessment - multiple wounds []  - 0 Dermatologic / Skin Assessment (not related to wound area) ASSESSMENTS - Focused Assessment []  - Circumferential Edema Measurements - multi extremities 0 []  - 0 Nutritional Assessment / Counseling / Intervention []  - 0 Lower Extremity Assessment (monofilament, tuning fork, pulses) []  - 0 Peripheral Arterial Disease Assessment (using hand held doppler) ASSESSMENTS - Ostomy and/or Continence Assessment and Care []  - Incontinence Assessment and Management 0 []  - 0 Ostomy Care Assessment and Management (repouching, etc.) PROCESS - Coordination of Care X - Simple Patient / Family Education for ongoing care 1 15 []  - 0 Complex (extensive) Patient / Family Education for ongoing care X- 1 10 Staff obtains , Records, Test Results / Process Orders []  - 0 Staff telephones HHA, Nursing Homes / Clarify orders / etc []  - 0 Routine Transfer to another Facility (non-emergent condition) []  - 0 Routine Hospital Admission (non-emergent condition) []  - 0 New Admissions / / Ordering NPWT, Apligraf, etc. []  - 0 Emergency Hospital Admission (emergent condition) X- 1 10 Simple Discharge Coordination []  - 0 Complex (extensive) Discharge Coordination PROCESS - Special Needs []  - Pediatric / Minor Patient Management 0 []  - 0 Isolation Patient Management []  - 0 Hearing / Language / Visual special needs []  - 0 Assessment of Community assistance (transportation, D/[name] planning, etc.) []  - 0 Additional assistance / Altered mentation []  - 0 Support Surface(s)  Assessment (bed, cushion, seat, etc.) INTERVENTIONS - Wound Cleansing / Measurement [name], [name] ( ) X- 1 5 Simple Wound Cleansing - one wound []  - 0 Complex Wound  Cleansing - multiple wounds X- 1 5 Wound Imaging (photographs - any number of wounds) []  - 0 Wound Tracing (instead of photographs) X- 1 5 Simple Wound Measurement - one wound []  - 0 Complex Wound Measurement - multiple wounds INTERVENTIONS - Wound Dressings []  - Small Wound Dressing one or multiple wounds 0 X- 1 15 Medium Wound Dressing one or multiple wounds []  - 0 Large Wound Dressing one or multiple wounds []  - 0 Application of Medications - topical []  - 0 Application of Medications - injection INTERVENTIONS - Miscellaneous []  - External ear exam 0 []  - 0 Specimen Collection (cultures, biopsies, blood, body fluids, etc.) []  - 0 Specimen(s) / Culture(s) sent or taken to Lab for analysis []  - 0 Patient Transfer (multiple staff / / Similar devices) []  - 0 Simple Staple / Suture removal (25 or less) []  - 0 Complex Staple / Suture removal (26 or more) []  - 0 Hypo / Hyperglycemic Management (close monitor of Blood Glucose) []  - 0 Ankle / Brachial Index (ABI) - do not check if billed separately X- 1 5 Vital Signs Has the patient been seen at the hospital within the last three years: Yes Total Score: 90 Level Of Care: New/Established - Level 3 Electronic Signature(s) Signed: 11/29/2021 4:59:[name] PM By: RN Entered By: on 11/29/2021 15:53:34 [name], [name] ( ) -------------------------------------------------------------------------------- Encounter Discharge Information Details Patient Name: [name], [name] Date of Service: 11/29/2021 3:30 PM Medical Record Number: Patient Account Number: Date of Birth/Sex: [name][birth year] (60 y.o. F) Treating RN: Primary Care Mashonda Broski: Other Clinician: Referring  Shany [name]: Card, John Treating Gerrianne Aydelott/Extender: in Treatment: 6 Encounter Discharge Information Items Discharge Condition: Stable Ambulatory Status: Wheelchair Discharge Destination: Home Transportation: Private Auto Accompanied By: self Schedule Follow-up Appointment: Yes Clinical Summary of Care: Patient Declined Electronic Signature(s) Signed: 11/29/2021 4:59:[name] PM By: Yevonne Pax RN Entered By: Yevonne Pax on 11/29/2021 15:52:43 [name], [name] (Marland Kitchen) -------------------------------------------------------------------------------- Wound Assessment Details Patient Name: [name], [name] Date of Service: 11/29/2021 3:30 PM Medical Record Number: 14/12/2020 Patient Account Number: [id number] Date of Birth/Sex: [birth year] [name] (60 y.o. F) Treating RN: 68 Primary Care Kroy Sprung: Card, Yevonne Pax Other Clinician: Referring Bridget Daniel: Card, John Treating Eloyse Causey/Extender: [name] in Treatment: 6 Wound Status Wound Number: 1 Primary Etiology: Dehisced Wound Wound Location: Distal, Midline Back Wound Status: Open Wounding Event: Surgical Injury Comorbid History: Hypertension Date Acquired: 04/11/2021 Weeks Of Treatment: 6 Clustered Wound: No Wound Measurements Length: (cm) 0.7 Width: (cm) 0.4 Depth: (cm) 4.2 Area: (cm) 0.22 Volume: (cm) 0.924 % Reduction in Area: -74.6% % Reduction in Volume: -59.9% Epithelialization: None Tunneling: No Undermining: No Wound Description Classification: Full Thickness Without Exposed Support Structures Exudate Amount: Large Exudate Type: Serosanguineous Exudate Color: red, brown Foul Odor After Cleansing: No Slough/Fibrino Yes Wound Bed Granulation Amount: Large (67-100%) Exposed Structure Granulation Quality: Red Fascia Exposed: No Necrotic Amount: None Present (0%) Fat Layer (Subcutaneous Tissue) Exposed: Yes Tendon Exposed: No Muscle Exposed: No Joint Exposed: No Bone Exposed:  No Treatment Notes Wound #1 (Back) Wound Laterality: Midline, Distal Cleanser Normal Saline Discharge Instruction: Wash your hands with soap and water. Remove old dressing, discard into plastic bag and place into trash. Cleanse the wound with Normal Saline prior to applying a clean dressing using gauze sponges, not tissues or cotton balls. Do not scrub or use excessive force. Pat dry using gauze sponges, not tissue or cotton balls. Peri-Wound Care Topical Primary Dressing Hydrofera  Blue Classic Foam Rope Dressing, 9x6 (mm/in) Secondary Dressing Zetuvit Plus Silicone Non-bordered 5x5 (in/in) Secured With Compression Wrap Compression Stockings Add-Ons [name], [name] ([id number]) Electronic Signature(s) Signed: 11/29/2021 4:59:[name] PM By: Yevonne Pax RN Entered By: Yevonne Pax on 11/29/2021 15:48:12

## 2021-11-30 NOTE — Progress Notes (Signed)
MESA, JANUS ([id number]) Visit Report for 11/29/2021 Physician Orders Details Patient Name: [name], [name] Date of Service: 11/29/2021 3:30 PM Medical Record Number: [id number] Patient Account Number: [id number] Date of Birth/Sex: 12/01/1961 (60 y.o. F) Treating RN: Yevonne Pax Primary Care Provider: [name] Other Clinician: Referring Provider: Card, John Treating Provider/Extender: Rowan Blase in Treatment: 6 Verbal / Phone Orders: No Diagnosis Coding Follow-up Appointments o Return Appointment in 1 week. o Nurse Visit as needed - nurse visit on thursday Home Health o [facility] for wound care. May utilize formulary equivalent dressing for wound treatment orders unless otherwise specified. Home Health Nurse may visit PRN to address patientos wound care needs. - 3 times per week [facility] fax [phone] Bathing/ Shower/ Hygiene o May shower; gently cleanse wound with antibacterial soap, rinse and pat dry prior to dressing wounds Edema Control - Lymphedema / Segmental Compressive Device / Other o Elevate, Exercise Daily and Avoid Standing for Long Periods of Time. o Elevate legs to the level of the heart and pump ankles as often as possible o Elevate leg(s) parallel to the floor when sitting. Wound Treatment Wound #1 - Back Wound Laterality: Midline, Distal Cleanser: Normal Saline 1 x Per Day/30 Days Discharge Instructions: Wash your hands with soap and water. Remove old dressing, discard into plastic bag and place into trash. Cleanse the wound with Normal Saline prior to applying a clean dressing using gauze sponges, not tissues or cotton balls. Do not scrub or use excessive force. Pat dry using gauze sponges, not tissue or cotton balls. Primary Dressing: Hydrofera Blue Classic Foam Rope Dressing, 9x6 (mm/in) 1 x Per Day/30 Days Secondary Dressing: Zetuvit Plus Silicone Non-bordered 5x5 (in/in) 1 x Per Day/30 Days Electronic  Signature(s) Signed: 11/29/2021 4:59:09 PM By: Yevonne Pax RN Signed: 11/29/2021 5:34:12 PM By: [name] PA-C Entered By: Yevonne Pax on 11/29/2021 15:51:37 [name], [name] ([id number]) -------------------------------------------------------------------------------- SuperBill Details Patient Name: [name], [name] Date of Service: 11/29/2021 Medical Record Number: [id number] Patient Account Number: [id number] Date of Birth/Sex: 1961-09-16 (60 y.o. 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

## 2021-12-03 LAB — AEROBIC CULTURE W GRAM STAIN (SUPERFICIAL SPECIMEN)

## 2021-12-04 ENCOUNTER — Encounter: Payer: 59 | Admitting: Physician Assistant

## 2021-12-04 ENCOUNTER — Other Ambulatory Visit: Payer: Self-pay

## 2021-12-04 DIAGNOSIS — T8131XA Disruption of external operation (surgical) wound, not elsewhere classified, initial encounter: Secondary | ICD-10-CM | POA: Diagnosis not present

## 2021-12-04 NOTE — Progress Notes (Addendum)
RHEYA, MINOGUE ([id number]) Visit Report for 12/04/2021 Chief Complaint Document Details Patient Name: Louque, Berit C. Date of Service: 12/04/2021 12:30 PM Medical Record Number: [id number] Patient Account Number: [id number] Date of Birth/Sex: 1961-05-03 (60 y.o. F) Treating RN: Yevonne Pax Primary Care Provider: [name] Other Clinician: Referring Provider: Card, John Treating Provider/Extender: Rowan Blase in Treatment: 7 Information Obtained from: Patient Chief Complaint Surgical Back Ulcer Electronic Signature(s) Signed: 12/04/2021 1:01:49 PM By: [name] PA-C Entered By: [name] on 12/04/2021 13:01:49 Erdahl, Jazlynn CMarland Kitchen ([id number]) -------------------------------------------------------------------------------- HPI Details Patient Name: Alaniz, Renelda C. Date of Service: 12/04/2021 12:30 PM Medical Record Number: [id number] Patient Account Number: [id number] Date of Birth/Sex: 1961-09-19 (60 y.o. F) Treating RN: Yevonne Pax Primary Care Provider: [name] Other Clinician: Referring Provider: Card, John Treating Provider/Extender: Rowan Blase in Treatment: 7 History of Present Illness HPI Description: 10/15/2021 upon evaluation today patient presents for initial evaluation here in the clinic concerning a surgical ulceration/dehiscence in the lumbar spine region following surgery that she had over the past year. This was actually broken up into 3 separate surgical events. The initial surgical intervention actually was on November 05, 2021 almost a year ago. Subsequently the patient went back in February for a seroma of the area which unfortunately required her to have a repeat surgery to go in and clean this out. And then again this occurred in April where she went back in and again they felt like stitches were coming out and there was an additional seroma. She was placed in a wound VAC initially and then subsequently as it got smaller that  was discontinued. Again right now I will see anything that I think a wound VAC would help with. Nonetheless she definitely has a significant depth to the wound that is going require packing. I actually believe the Hydrofera Blue rope would probably do quite well with this the problem is as much as it is draining she probably needs this to be changed at least every day. She does not really have anyone that can help with that that is the complicating scenario here. With that being said the patient does have a history of multiple sclerosis, hypertension, and again this surgical wound dehiscence in regard to her lumbar spine region. She did have a repeat MRI which was actually completed 10/09/2021. This showed that there was no significant change in the subcutaneous fluid collection/track of the lower lumbar region. This is extending to the level of the fascia unfortunately. This seems to go all the way from the L2 level with a track extending all the way to the fascia at the L4-5 level. Again this is a significant wound and there is significant drainage but does not seem to communicate to the spinal region as far as spinal fluid or otherwise is concerned that is good news. Nonetheless she last saw Dr. Franky Macho who is her neurosurgeon on 10/01/2021 that was when he ordered this last MRI she supposed to see him next week as well. With that being said he did not feel like there was any significant issue there but was not sure why this was not healing that is when he ordered the MRI. They were wanting to make sure that this was packed appropriately by home health unfortunately the main issue currently is that home health is completely out of the picture as the patient has exhausted all the home health that that she gets for a year. She is now in a very difficult  predicament where she does not have anyone to help her change the dressing and to be honest that she is not able to do it herself with the location of  the wound being on the midline lumbar spine region. If she does not have anyone that can help it is probably can to be necessary for her to go to a facility for rehab and daily dressing changes as I feel like daily changes which is much drainage that she is having is going to be necessary. 10/23/2021 upon evaluation today patient appears to be doing decently well in regard to her back ulcer. This does seem to be draining a lot less than what it is been doing in the past. With that being said she still has quite a bit of drainage nonetheless. I do think that given time this should improve least I hope so. The good news is she does have home health coming out 3 days a week were doing it 2 days a week and she is paying someone we can to help. 10/30/2021 upon evaluation today patient appears to be doing okay in regard to her back ulcer this is not draining quite as bad as it was in the beginning but he still has quite a bit of drainage noted. I do believe that the patient would benefit from [place] going ahead forward with attempting a wound VAC using the Hydrofera Blue rope to pack with and then subsequently using the VAC externally to actually suction out and help this to fill- in. I think this is our ideal way to try to get things cleared at this point. As it stands I am not certain that we are really making a progress that we want to see near with doing it in the way we are which is packing with the rope. It is a good dressing but I do think it is insufficient for total healing. She just seems to have too much in the way of drainage at this point unfortunately. 11/06/2021 upon evaluation today patient unfortunately continues to have issues with her back ongoing. The good news is her MRI that was repeated showed signs of the size of this area in the lumbar spine region having decreased from 4 cm to 3.5 cm this is definitely not bad news at all. With that being said unfortunately she continues to have issues  with ongoing drainage not as severe as in the beginning but nonetheless still significant. I do think a wound VAC still would be a good way to go although her home health agency nurse apparently has some concerns about the possibility of not being able to keep a seal with this as they had struggles in the past. Nonetheless I explained to the patient that this is much different than what she had previous and that I really feel like it would do much better as far as getting the area taken care of without having any complications or issues here. I think that we should be able to maintain a seal. Nonetheless at this time I did discuss with the patient as well that she probably does need to have a wound VAC in order for [place] to get this moving in the right direction. 11/13/2021 upon evaluation patient's wound bed actually showed signs of significant drainage at this time. She did see the surgeon yesterday he did not see anything that appeared to be infected. Nonetheless he does appear that she is continuing to have areas here that just do not seem  to want to seal up there MRI findings have been negative but nonetheless she continues to have is the seroma that is filling in. I do feel like we need to try to widen the hole so we can get at least a half of the [facility] then this will be better than nothing at this point. 11/20/2021 upon evaluation today patient actually appears to be having less pain at this point which is good news and overall she we still do not have the results of the culture back yet it had to be sent out to [facility] and we do not have the result back yet. Is doing decently well in regard to her wound. Fortunately there does not appear to be any signs of active infection systemically nor locally at this time. 11/27/2021 upon evaluation today patient appears to be doing well with regard to her wound all things considered there does appear to be less drainage than there was previous.  Fortunately I do not see any evidence of worsening of the patient is stating that she is having some issues with back pain. This is somewhat new. Again this I think could be related to the fact that she is having some issues here with infection. We are still waiting to see what the result of her culture shows from susceptibility testing Enterococcus has been identified but we do not know if this is VRE or not. 12/04/2021 upon evaluation today patient appears to be doing well with regard to her wound all things considered. I did have a conversation with Erin from Dr. Sueanne Margarita office. Of note she notes that Dr. Drinda Butts really does not want to do anything surgical right now which I completely understand. With that being said I am still leery of how far we will make it getting this to heal short of any type of surgery to open this up and allow [place] to more appropriately packed the wound. Nonetheless I will absolutely give it our best shot as far as that is concerned. I discussed that with the patient today. She voiced understanding. The good news is the drainage today seems to be clear it is no longer cloudy as it was previous I am actually very pleased in that regard. ARTHUR, AYDELOTTE ([id number]) Electronic Signature(s) Signed: 12/04/2021 3:58:48 PM By: [name] PA-C Entered By: [name] on 12/04/2021 15:58:48 Whetstone, Maryn Salena Saner ([id number]) -------------------------------------------------------------------------------- Physical Exam Details Patient Name: Popov, Reanne C. Date of Service: 12/04/2021 12:30 PM Medical Record Number: [id number] Patient Account Number: [id number] Date of Birth/Sex: 08/02/1961 (60 y.o. F) Treating RN: Yevonne Pax Primary Care Provider: [name] Other Clinician: Referring Provider: Card, John Treating Provider/Extender: Rowan Blase in Treatment: 7 Constitutional Obese and well-hydrated in no acute distress. Respiratory normal breathing  without difficulty. Psychiatric this patient is able to make decisions and demonstrates good insight into disease process. Alert and Oriented x 3. pleasant and cooperative. Notes Upon inspection patient's drainage actually appears to be improved I do believe that the Augmentin has been beneficial for her currently. Overall I do not see any signs of active infection which is also great news. I think that the fact this is no longer cloudy from a drainage standpoint is an excellent sign. Nonetheless she still has the 6:00 location as well as download that we are trying to pack and again this is not the easiest thing to do. I would attempt to do this little bit differently today to see if I can try to  pack both areas as well as possible without over packing. Electronic Signature(s) Signed: 12/04/2021 3:59:34 PM By: [name] PA-C Entered By: [name] on 12/04/2021 15:59:34 Roam, Domingo Pulse ([id number]) -------------------------------------------------------------------------------- Physician Orders Details Patient Name: Robak, Pria C. Date of Service: 12/04/2021 12:30 PM Medical Record Number: [id number] Patient Account Number: [id number] Date of Birth/Sex: 1961-05-17 (60 y.o. 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 o Nurse Visit as needed - nurse visit on thursday Home Health o [facility] for wound care. May utilize formulary equivalent dressing for wound treatment  orders unless otherwise specified. Home Health Nurse may visit PRN to address patientos wound care needs. - 3 times per week [facility] fax [phone] Bathing/ Shower/ Hygiene o May shower; gently cleanse wound with antibacterial soap, rinse and pat dry prior to dressing wounds Edema Control - Lymphedema / Segmental Compressive Device / Other o Elevate, Exercise Daily and Avoid Standing for Long Periods of Time. o Elevate legs to the level of the heart and pump ankles as often as possible o Elevate leg(s) parallel to the floor when sitting. Wound Treatment Wound #1 - Back Wound Laterality: Midline, Distal Cleanser: Normal Saline 1 x Per Day/30 Days Discharge Instructions: Wash your hands with soap and water. Remove old dressing, discard into plastic bag and place into trash. Cleanse the wound with Normal Saline prior to applying a clean dressing using gauze sponges, not tissues or cotton balls. Do not scrub or use excessive force. Pat dry using gauze sponges, not tissue or cotton balls. Primary Dressing: Hydrofera Blue Classic Foam Rope Dressing, 9x6 (mm/in) 1 x Per Day/30 Days Secondary Dressing: Zetuvit Plus Silicone Non-bordered 5x5 (in/in) 1 x Per Day/30 Days Electronic Signature(s) Signed: 12/04/2021 3:44:10 PM By: Yevonne Pax RN Signed: 12/04/2021 4:19:41 PM By: [name] PA-C Entered By: Yevonne Pax on 12/04/2021 13:05:30 Schadt, Prerana C. ([id number]) -------------------------------------------------------------------------------- Problem List Details Patient Name: Schuchart, Lanecia C. Date of Service: 12/04/2021 12:30 PM Medical Record Number: [id number] Patient Account Number: [id number] Date of Birth/Sex: [birth year] (60 y.o. 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 ([id number]) -------------------------------------------------------------------------------- Progress Note Details Patient Name: Liberman, Karaline C. Date of Service: 12/04/2021 12:30 PM Medical Record Number: [id number] Patient Account Number: [id number] Date of Birth/Sex: [birth year] (60 y.o. F) Treating RN: Yevonne Pax Primary Care Provider: [name] Other Clinician: Referring Provider: Card, John Treating Provider/Extender: Rowan Blase in Treatment: 7 Subjective Chief Complaint Information obtained from Patient Surgical Back Ulcer History of Present Illness (HPI) 10/15/2021 upon evaluation today patient presents for initial evaluation here in the clinic concerning  a surgical ulceration/dehiscence in the lumbar spine region following surgery that she had over the past year. This was actually broken up into 3 separate surgical events. The initial surgical intervention actually was on November 05, 2021 almost a year ago. Subsequently the patient went back in February for a seroma of the area which unfortunately required her to have a repeat surgery to go in and clean this out. And then again this occurred in April where she went back in and again they felt like stitches were coming out and there was an additional seroma. She was placed in a wound VAC initially and then subsequently as it got smaller that was discontinued. Again right now I will see anything that I think a wound VAC would  help with. Nonetheless she definitely has a significant depth to the wound that is going require packing. I actually believe the Hydrofera Blue rope would probably do quite well with this the problem is as much as it is draining she probably needs this to be changed at least every day. She does not really have anyone that can help with that that is the complicating scenario here. With that being said the patient does have a history of multiple sclerosis, hypertension, and again this surgical wound dehiscence in regard to her lumbar spine region. She did have a repeat MRI which was actually completed 10/09/2021. This showed that there was no significant change in the subcutaneous fluid collection/track of the lower lumbar region. This is extending to the level of the fascia unfortunately. This seems to go all the way from the L2 level with a track extending all the way to the fascia at the L4-5 level. Again this is a significant wound and there is significant drainage but does not seem to communicate to the spinal region as far as spinal fluid or otherwise is concerned that is good news. Nonetheless she last saw Dr. Franky Macho who is her neurosurgeon on 10/01/2021 that was when he ordered this last MRI she supposed to see him next week as well. With that being said he did not feel like there was any significant issue there but was not sure why this was not healing that is when he ordered the MRI. They were wanting to make sure that this was packed appropriately by home health unfortunately the main issue currently is that home health is completely out of the picture as the patient has exhausted all the home health that that she gets for a year. She is now in a very difficult predicament where she does not have anyone to help her change the dressing and to be honest that she is not able to do it herself with the location of the wound being on the midline lumbar spine region. If she does not have anyone that  can help it is probably can to be necessary for her to go to a facility for rehab and daily dressing changes as I feel like daily changes which is much drainage that she is having is going to be necessary. 10/23/2021 upon evaluation today patient appears to be doing decently well in regard to her back ulcer. This does seem to be draining a lot less than what it is been doing in the past. With that being said she still has quite a bit of drainage nonetheless. I do think that given time this should improve least I hope so. The good news is she does have home health coming out 3 days a week were  doing it 2 days a week and she is paying someone we can to help. 10/30/2021 upon evaluation today patient appears to be doing okay in regard to her back ulcer this is not draining quite as bad as it was in the beginning but he still has quite a bit of drainage noted. I do believe that the patient would benefit from [place] going ahead forward with attempting a wound VAC using the Hydrofera Blue rope to pack with and then subsequently using the VAC externally to actually suction out and help this to fill- in. I think this is our ideal way to try to get things cleared at this point. As it stands I am not certain that we are really making a progress that we want to see near with doing it in the way we are which is packing with the rope. It is a good dressing but I do think it is insufficient for total healing. She just seems to have too much in the way of drainage at this point unfortunately. 11/06/2021 upon evaluation today patient unfortunately continues to have issues with her back ongoing. The good news is her MRI that was repeated showed signs of the size of this area in the lumbar spine region having decreased from 4 cm to 3.5 cm this is definitely not bad news at all. With that being said unfortunately she continues to have issues with ongoing drainage not as severe as in the beginning but nonetheless  still significant. I do think a wound VAC still would be a good way to go although her home health agency nurse apparently has some concerns about the possibility of not being able to keep a seal with this as they had struggles in the past. Nonetheless I explained to the patient that this is much different than what she had previous and that I really feel like it would do much better as far as getting the area taken care of without having any complications or issues here. I think that we should be able to maintain a seal. Nonetheless at this time I did discuss with the patient as well that she probably does need to have a wound VAC in order for [place] to get this moving in the right direction. 11/13/2021 upon evaluation patient's wound bed actually showed signs of significant drainage at this time. She did see the surgeon yesterday he did not see anything that appeared to be infected. Nonetheless he does appear that she is continuing to have areas here that just do not seem to want to seal up there MRI findings have been negative but nonetheless she continues to have is the seroma that is filling in. I do feel like we need to try to widen the hole so we can get at least a half of the [facility] then this will be better than nothing at this point. 11/20/2021 upon evaluation today patient actually appears to be having less pain at this point which is good news and overall she we still do not have the results of the culture back yet it had to be sent out to [facility] and we do not have the result back yet. Is doing decently well in regard to her wound. Fortunately there does not appear to be any signs of active infection systemically nor locally at this time. 11/27/2021 upon evaluation today patient appears to be doing well with regard to her wound all things considered there does appear to be less drainage than there was previous.  Fortunately I do not see any evidence of worsening of the patient is stating  that she is having some issues with back pain. This is somewhat new. Again this I think could be related to the fact that she is having some issues here with infection. We are still waiting to see what the result of her culture shows from susceptibility testing Enterococcus has been identified but we do not know if this is VRE or not. 12/04/2021 upon evaluation today patient appears to be doing well with regard to her wound all things considered. I did have a conversation with Erin from Dr. Sueanne Margarita office. Of note she notes that Dr. Drinda Butts really does not want to do anything surgical right now which I completely Sabella, Glanda C. ([id number]) understand. With that being said I am still leery of how far we will make it getting this to heal short of any type of surgery to open this up and allow [place] to more appropriately packed the wound. Nonetheless I will absolutely give it our best shot as far as that is concerned. I discussed that with the patient today. She voiced understanding. The good news is the drainage today seems to be clear it is no longer cloudy as it was previous I am actually very pleased in that regard. Objective Constitutional Obese and well-hydrated in no acute distress. Vitals Time Taken: 12:46 PM, Weight: 275 lbs, Temperature: 98.2 F, Pulse: 87 bpm, Respiratory Rate: 16 breaths/min, Blood Pressure: 153/86 mmHg. Respiratory normal breathing without difficulty. Psychiatric this patient is able to make decisions and demonstrates good insight into disease process. Alert and Oriented x 3. pleasant and cooperative. General Notes: Upon inspection patient's drainage actually appears to be improved I do believe that the Augmentin has been beneficial for her currently. Overall I do not see any signs of active infection which is also great news. I think that the fact this is no longer cloudy from a drainage standpoint is an excellent sign. Nonetheless she still has the 6:00 location  as well as download that we are trying to pack and again this is not the easiest thing to do. I would attempt to do this little bit differently today to see if I can try to pack both areas as well as possible without over packing. Integumentary (Hair, Skin) Wound #1 status is Open. Original cause of wound was Surgical Injury. The date acquired was: 04/11/2021. The wound has been in treatment 7 weeks. The wound is located on the Distal,Midline Back. The wound measures 0.6cm length x 0.3cm width x 4cm depth; 0.141cm^2 area and 0.565cm^3 volume. There is Fat Layer (Subcutaneous Tissue) exposed. There is no tunneling or undermining noted. There is a large amount of serosanguineous drainage noted. There is large (67-100%) red granulation within the wound bed. There is no necrotic tissue within the wound bed. 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 Nurse Visit as needed - nurse visit on thursday Home Health: [facility] for wound care. May utilize formulary equivalent dressing for wound treatment orders unless otherwise specified. Home Health Nurse may visit PRN to address patient s wound care needs. - 3 times per week [facility] fax [phone] Bathing/ Shower/ Hygiene: May shower; gently cleanse wound with antibacterial soap, rinse and pat dry prior to dressing wounds Edema Control - Lymphedema / Segmental Compressive  Device / Other: Elevate, Exercise Daily and Avoid Standing for Long Periods of Time. Elevate legs to the level of the heart and pump ankles as often as possible Elevate leg(s) parallel to the floor when sitting. WOUND #1: - Back Wound Laterality: Midline, Distal Eckford, Taysha C. ([id number]) Cleanser: Normal Saline  1 x Per Day/30 Days Discharge Instructions: Wash your hands with soap and water. Remove old dressing, discard into plastic bag and place into trash. Cleanse the wound with Normal Saline prior to applying a clean dressing using gauze sponges, not tissues or cotton balls. Do not scrub or use excessive force. Pat dry using gauze sponges, not tissue or cotton balls. Primary Dressing: Hydrofera Blue Classic Foam Rope Dressing, 9x6 (mm/in) 1 x Per Day/30 Days Secondary Dressing: Zetuvit Plus Silicone Non-bordered 5x5 (in/in) 1 x Per Day/30 Days 1. I was able to actually get the area packed today at 12:00 which has been the complicating factor the 6:00 is easily. We had her actually stand up for this. I did initially feed the Hydrofera Blue rope up into the 12:00 location and then subsequently once I got it there stopped and moisten the Hydrofera Blue rope. I then continue to pack the Hydrofera Blue rope down into the 6:00 location try not to over pack but to fill the space. She had a little bit of discomfort with this but I was able to more effectively I think Packett this may work although I am not sure the home health nurses good to be able to do this currently we will try it here in the office for see how this goes and then may contact her to see about having her do the same. For the time being is can have her packet as she has been doing to the 6:00 location mainly. 2. Of course we will be continuing with the [facility] Blue rope which I think is the best option here its keeping the wound open whereas the packing strip was allowing it to close down too tightly. We will see patient back for reevaluation in 1 week here in the clinic. If anything worsens or changes patient will contact our office for additional recommendations. Electronic Signature(s) Signed: 12/04/2021 4:01:00 PM By: [name] PA-C Entered By: [name] on 12/04/2021 16:00:59 Puccini, Domingo Pulse  ([id number]) -------------------------------------------------------------------------------- SuperBill Details Patient Name: Perrelli, Anayiah C. Date of Service: 12/04/2021 Medical Record Number: [id number] Patient Account Number: [id number] Date of Birth/Sex: [birth year] (60 y.o. 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 G35 Multiple sclerosis I10 Essential (primary) hypertension Facility Procedures CPT4 Code: 46962952 Description: 99213 - WOUND CARE VISIT-LEV 3 EST PT Modifier: Quantity: 1 Physician Procedures CPT4 Code: 8413244 Description: 99214 - WC PHYS LEVEL 4 - EST PT Modifier: Quantity: 1 CPT4 Code: Description: ICD-10 Diagnosis Description T81.31XA Disruption of external operation (surgical) wound, not elsewhere classifi 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

## 2021-12-04 NOTE — Progress Notes (Signed)
ALMER, FENNESSEY ([id number]) Visit Report for 12/04/2021 Arrival Information Details Patient Name: Zima, MAR SCHWABAUER. Date of Service: 12/04/2021 12:30 PM Medical Record Number: [id number] Patient Account Number: [id number] Date of Birth/Sex: 04/18/1961 (60 y.o. F) Treating RN: Yevonne Pax Primary Care Jarred Purtee: Card, Jonny Ruiz Other Clinician: Referring Keiandra Sullenger: Card, John Treating Aryaa Bunting/Extender: Rowan Blase in Treatment: 7 Visit Information History Since Last Visit All ordered tests and consults were completed: No Patient Arrived: [name] Added or deleted any medications: No Arrival Time: 12:42 Any new allergies or adverse reactions: No Accompanied By: self Had a fall or experienced change in No Transfer Assistance: None activities of daily living that may affect Patient Identification Verified: Yes risk of falls: Secondary Verification Process Completed: Yes Signs or symptoms of abuse/neglect since last visito No Patient Requires Transmission-Based Precautions: No Hospitalized since last visit: No Patient Has Alerts: No Implantable device outside of the clinic excluding No cellular tissue based products placed in the center since last visit: Has Dressing in Place as Prescribed: Yes Pain Present Now: Yes Electronic Signature(s) Signed: 12/04/2021 3:44:10 PM By: Yevonne Pax RN Entered By: Yevonne Pax on 12/04/2021 12:45:42 Brickle, Kalima CMarland Kitchen ([id number]) -------------------------------------------------------------------------------- Clinic Level of Care Assessment Details Patient Name: Schomer, Amerie C. Date of Service: 12/04/2021 12:30 PM Medical Record Number: [id number] Patient Account Number: [id number] Date of Birth/Sex: 1961-04-19 (60 y.o. F) Treating RN: Yevonne Pax Primary Care Maleia Weems: Card, Jonny Ruiz Other Clinician: Referring Dayani Winbush: Card, John Treating Audi Wettstein/Extender: Rowan Blase in Treatment: 7 Clinic Level of Care Assessment Items TOOL 4  Quantity Score X - Use when only an EandM is performed on FOLLOW-UP visit 1 0 ASSESSMENTS - Nursing Assessment / Reassessment X - Reassessment of Co-morbidities (includes updates in patient status) 1 10 X- 1 5 Reassessment of Adherence to Treatment Plan ASSESSMENTS - Wound and Skin Assessment / Reassessment X - Simple Wound Assessment / Reassessment - one wound 1 5 []  - 0 Complex Wound Assessment / Reassessment - multiple wounds []  - 0 Dermatologic / Skin Assessment (not related to wound area) ASSESSMENTS - Focused Assessment []  - Circumferential Edema Measurements - multi extremities 0 []  - 0 Nutritional Assessment / Counseling / Intervention []  - 0 Lower Extremity Assessment (monofilament, tuning fork, pulses) []  - 0 Peripheral Arterial Disease Assessment (using hand held doppler) ASSESSMENTS - Ostomy and/or Continence Assessment and Care []  - Incontinence Assessment and Management 0 []  - 0 Ostomy Care Assessment and Management (repouching, etc.) PROCESS - Coordination of Care X - Simple Patient / Family Education for ongoing care 1 15 []  - 0 Complex (extensive) Patient / Family Education for ongoing care X- 1 10 Staff obtains Chiropractor, Records, Test Results / Process Orders []  - 0 Staff telephones HHA, Nursing Homes / Clarify orders / etc []  - 0 Routine Transfer to another Facility (non-emergent condition) []  - 0 Routine Hospital Admission (non-emergent condition) []  - 0 New Admissions / Manufacturing engineer / Ordering NPWT, Apligraf, etc. []  - 0 Emergency Hospital Admission (emergent condition) X- 1 10 Simple Discharge Coordination []  - 0 Complex (extensive) Discharge Coordination PROCESS - Special Needs []  - Pediatric / Minor Patient Management 0 []  - 0 Isolation Patient Management []  - 0 Hearing / Language / Visual special needs []  - 0 Assessment of Community assistance (transportation, D/C planning, etc.) []  - 0 Additional assistance / Altered  mentation []  - 0 Support Surface(s) Assessment (bed, cushion, seat, etc.) INTERVENTIONS - Wound Cleansing / Measurement Fallert, Lamiah C. ([id number]) X- 1 5 Simple Wound Cleansing -  one wound []  - 0 Complex Wound Cleansing - multiple wounds X- 1 5 Wound Imaging (photographs - any number of wounds) []  - 0 Wound Tracing (instead of photographs) X- 1 5 Simple Wound Measurement - one wound []  - 0 Complex Wound Measurement - multiple wounds INTERVENTIONS - Wound Dressings X - Small Wound Dressing one or multiple wounds 1 10 []  - 0 Medium Wound Dressing one or multiple wounds []  - 0 Large Wound Dressing one or multiple wounds X- 1 5 Application of Medications - topical []  - 0 Application of Medications - injection INTERVENTIONS - Miscellaneous []  - External ear exam 0 []  - 0 Specimen Collection (cultures, biopsies, blood, body fluids, etc.) []  - 0 Specimen(s) / Culture(s) sent or taken to Lab for analysis []  - 0 Patient Transfer (multiple staff / / Similar devices) []  - 0 Simple Staple / Suture removal (25 or less) []  - 0 Complex Staple / Suture removal (26 or more) []  - 0 Hypo / Hyperglycemic Management (close monitor of Blood Glucose) []  - 0 Ankle / Brachial Index (ABI) - do not check if billed separately X- 1 5 Vital Signs Has the patient been seen at the hospital within the last three years: Yes Total Score: 90 Level Of Care: New/Established - Level 3 Electronic Signature(s) Signed: 12/04/2021 3:44:10 PM By: RN Entered By: on 12/04/2021 13:17:06 Zaino, Evva C ( ) -------------------------------------------------------------------------------- Encounter Discharge Information Details Patient Name: Doering, Cloey C. Date of Service: 12/04/2021 12:30 PM Medical Record Number: Patient Account Number: Date of Birth/Sex: [birth year] (60 y.o. F) Treating RN: Primary Care Alonza Knisley:  Other Clinician: Referring Jakai Onofre: Card, John Treating Yasemin Rabon/Extender: in Treatment: 7 Encounter Discharge Information Items Discharge Condition: Stable Ambulatory Status: Walker Discharge Destination: Home Transportation: Private Auto Accompanied By: self Schedule Follow-up Appointment: Yes Clinical Summary of Care: Patient Declined Electronic Signature(s) Signed: 12/04/2021 3:44:10 PM By: Yevonne Pax RN Entered By: Yevonne Pax on 12/04/2021 13:17:52 Fleece, Yari C. (Marland Kitchen) -------------------------------------------------------------------------------- Lower Extremity Assessment Details Patient Name: Sheppard, Malloree C. Date of Service: 12/04/2021 12:30 PM Medical Record Number: 14/05/2021 Patient Account Number: [id number] Date of Birth/Sex: [birth year] (60 y.o. F) Treating RN: 68 Primary Care Klair Leising: Yevonne Pax Other Clinician: Referring Pam Vanalstine: Card, John Treating Roey Coopman/Extender: [name] in Treatment: 7 Electronic Signature(s) Signed: 12/04/2021 3:44:10 PM By: 14/05/2021 RN Entered By: Yevonne Pax on 12/04/2021 12:52:21 Kovarik, Lakeyia C14/05/2021 ([id number]) -------------------------------------------------------------------------------- Multi Wound Chart Details Patient Name: Jolliff, Jailine C. Date of Service: 12/04/2021 12:30 PM Medical Record Number: [id number] Patient Account Number: [id number] Date of Birth/Sex: [birth year] (60 y.o. F) Treating RN: Yevonne Pax Primary Care Joshwa Hemric: [name] Other Clinician: Referring Kellsey Sansone: Card, John Treating Ondre Salvetti/Extender: Rowan Blase in Treatment: 7 Vital Signs Height(in): Pulse(bpm): 87 Weight(lbs): 275 Blood Pressure(mmHg): 153/86 Body Mass Index(BMI): Temperature(F): 98.2 Respiratory Rate(breaths/min): 16 Photos: [N/A:N/A] Wound Location: Distal, Midline Back N/A N/A Wounding Event: Surgical Injury N/A N/A Primary Etiology: Dehisced Wound  N/A N/A Comorbid History: Hypertension N/A N/A Date Acquired: 04/11/2021 N/A N/A Weeks of Treatment: 7 N/A N/A Wound Status: Open N/A N/A Measurements L x W x D (cm) 0.6x0.3x4 N/A N/A Area (cm) : 0.141 N/A N/A Volume (cm) : 0.565 N/A N/A % Reduction in Area: -11.90% N/A N/A % Reduction in Volume: 2.20% N/A N/A Classification: Full Thickness Without Exposed N/A N/A Support Structures Exudate Amount: Large N/A N/A Exudate Type: Serosanguineous N/A N/A Exudate Color: red, brown N/A N/A  Granulation Amount: Large (67-100%) N/A N/A Granulation Quality: Red N/A N/A Necrotic Amount: None Present (0%) N/A N/A Exposed Structures: Fat Layer (Subcutaneous Tissue): N/A N/A Yes Fascia: No Tendon: No Muscle: No Joint: No Bone: No Epithelialization: None N/A N/A Treatment Notes Electronic Signature(s) Signed: 12/04/2021 3:44:10 PM By: Yevonne Pax RN Entered By: Yevonne Pax on 12/04/2021 13:04:23 Fedrick, Abygale Salena Saner ([id number]) -------------------------------------------------------------------------------- Multi-Disciplinary Care Plan Details Patient Name: Mol, Jalayla C. Date of Service: 12/04/2021 12:30 PM Medical Record Number: [id number] Patient Account Number: [id number] Date of Birth/Sex: 06/27/1961 (60 y.o. F) Treating RN: Yevonne Pax Primary Care Jeramey Lanuza: [name] Other Clinician: Referring Uriah Trueba: Card, John Treating Zakiyah Diop/Extender: Rowan Blase in Treatment: 7 Active Inactive Wound/Skin Impairment Nursing Diagnoses: Knowledge deficit related to ulceration/compromised skin integrity Goals: Patient/caregiver will verbalize understanding of skin care regimen Date Initiated: 10/15/2021 Target Resolution Date: 12/15/2021 Goal Status: Active Ulcer/skin breakdown will have a volume reduction of 30% by week 4 Date Initiated: 10/15/2021 Target Resolution Date: 12/15/2021 Goal Status: Active Ulcer/skin breakdown will have a volume reduction of 50% by week  8 Date Initiated: 10/15/2021 Target Resolution Date: 01/15/2022 Goal Status: Active Ulcer/skin breakdown will have a volume reduction of 80% by week 12 Date Initiated: 10/15/2021 Target Resolution Date: 02/15/2022 Goal Status: Active Ulcer/skin breakdown will heal within 14 weeks Date Initiated: 10/15/2021 Target Resolution Date: 03/15/2022 Goal Status: Active Interventions: Assess patient/caregiver ability to obtain necessary supplies Assess patient/caregiver ability to perform ulcer/skin care regimen upon admission and as needed Assess ulceration(s) every visit Notes: Electronic Signature(s) Signed: 12/04/2021 3:44:10 PM By: Yevonne Pax RN Entered By: Yevonne Pax on 12/04/2021 13:04:04 Pedley, Lamonica C. ([id number]) -------------------------------------------------------------------------------- Pain Assessment Details Patient Name: Trost, Riannah C. Date of Service: 12/04/2021 12:30 PM Medical Record Number: [id number] Patient Account Number: [id number] Date of Birth/Sex: October 10, 1961 (60 y.o. F) Treating RN: Yevonne Pax Primary Care Linette Gunderson: [name] Other Clinician: Referring March Steyer: Card, John Treating Kista Robb/Extender: Rowan Blase in Treatment: 7 Active Problems Location of Pain Severity and Description of Pain Patient Has Paino Yes Site Locations With Dressing Change: Yes Duration of the Pain. Constant / Intermittento Constant Rate the pain. Current Pain Level: 3 Worst Pain Level: 5 Least Pain Level: 2 Tolerable Pain Level: 5 Character of Pain Describe the Pain: Aching Pain Management and Medication Current Pain Management: Medication: Yes Cold Application: No Rest: Yes Massage: No Activity: No T.E.N.S.: No Heat Application: No Leg drop or elevation: No Is the Current Pain Management Adequate: Inadequate How does your wound impact your activities of daily livingo Sleep: No Bathing: No Appetite: No Relationship With Others: No Bladder  Continence: No Emotions: No Bowel Continence: No Work: No Toileting: No Drive: No Dressing: No Hobbies: No Electronic Signature(s) Signed: 12/04/2021 3:44:10 PM By: Yevonne Pax RN Entered By: Yevonne Pax on 12/04/2021 12:46:54 Hurta, Eria Salena Saner ([id number]) -------------------------------------------------------------------------------- Patient/Caregiver Education Details Patient Name: Mittman, Jazzie C. Date of Service: 12/04/2021 12:30 PM Medical Record Number: [id number] Patient Account Number: [id number] Date of Birth/Gender: [birth year] (60 y.o. F) Treating RN: Yevonne Pax Primary Care Physician: [name] Other Clinician: Referring Physician: Card, John Treating Physician/Extender: Rowan Blase in Treatment: 7 Education Assessment Education Provided To: Patient Education Topics Provided Wound/Skin Impairment: Methods: Explain/Verbal Responses: State content correctly Electronic Signature(s) Signed: 12/04/2021 3:44:10 PM By: Yevonne Pax RN Entered By: Yevonne Pax on 12/04/2021 13:05:46 Demarais, Truda C. ([id number]) -------------------------------------------------------------------------------- Wound Assessment Details Patient Name: Folden, Reyann C. Date of Service: 12/04/2021 12:30 PM Medical Record Number: [id number] Patient Account Number: [id number] Date of Birth/Sex:  02/24/1961 (60 y.o. F) Treating RN: Yevonne Pax Primary Care Allie Ousley: Card, Jonny Ruiz Other Clinician: Referring Jamara Vary: Card, John Treating Tymarion Everard/Extender: Rowan Blase in Treatment: 7 Wound Status Wound Number: 1 Primary Etiology: Dehisced Wound Wound Location: Distal, Midline Back Wound Status: Open Wounding Event: Surgical Injury Comorbid History: Hypertension Date Acquired: 04/11/2021 Weeks Of Treatment: 7 Clustered Wound: No Photos Wound Measurements Length: (cm) 0.6 Width: (cm) 0.3 Depth: (cm) 4 Area: (cm) 0.141 Volume: (cm) 0.565 % Reduction in Area:  -11.9% % Reduction in Volume: 2.2% Epithelialization: None Tunneling: No Undermining: No Wound Description Classification: Full Thickness Without Exposed Support Structures Exudate Amount: Large Exudate Type: Serosanguineous Exudate Color: red, brown Foul Odor After Cleansing: No Slough/Fibrino No Wound Bed Granulation Amount: Large (67-100%) Exposed Structure Granulation Quality: Red Fascia Exposed: No Necrotic Amount: None Present (0%) Fat Layer (Subcutaneous Tissue) Exposed: Yes Tendon Exposed: No Muscle Exposed: No Joint Exposed: No Bone Exposed: No Treatment Notes Wound #1 (Back) Wound Laterality: Midline, Distal Cleanser Normal Saline Discharge Instruction: Wash your hands with soap and water. Remove old dressing, discard into plastic bag and place into trash. Cleanse the wound with Normal Saline prior to applying a clean dressing using gauze sponges, not tissues or cotton balls. Do not scrub or use excessive force. Pat dry using gauze sponges, not tissue or cotton balls. Wenker, Rhylei C. ([id number]) Peri-Wound Care Topical Primary Dressing Hydrofera Blue Classic Foam Rope Dressing, 9x6 (mm/in) Secondary Dressing Zetuvit Plus Silicone Non-bordered 5x5 (in/in) Secured With Compression Wrap Compression Stockings Add-Ons Electronic Signature(s) Signed: 12/04/2021 3:44:10 PM By: Yevonne Pax RN Entered By: Yevonne Pax on 12/04/2021 12:52:02 Studstill, Zailyn C. ([id number]) -------------------------------------------------------------------------------- Vitals Details Patient Name: Whisenant, Samanthia C. Date of Service: 12/04/2021 12:30 PM Medical Record Number: [id number] Patient Account Number: [id number] Date of Birth/Sex: 06-Oct-1961 (60 y.o. F) Treating RN: Yevonne Pax Primary Care Issiac Jamar: [name] Other Clinician: Referring Hennessy Bartel: Card, John Treating Xadrian Craighead/Extender: Rowan Blase in Treatment: 7 Vital Signs Time Taken: 12:46 Temperature  (F): 98.2 Weight (lbs): 275 Pulse (bpm): 87 Respiratory Rate (breaths/min): 16 Blood Pressure (mmHg): 153/86 Reference Range: 80 - 120 mg / dl Electronic Signature(s) Signed: 12/04/2021 3:44:10 PM By: Yevonne Pax RN Entered By: Yevonne Pax on 12/04/2021 12:46:15

## 2021-12-05 NOTE — Progress Notes (Signed)
EMONII, WIENKE ([id number]) Visit Report for 11/20/2021 Arrival Information Details Patient Name: [name], [name] Date of Service: 11/20/2021 12:30 PM Medical Record Number: [id number] Patient Account Number: [id number] Date of Birth/Sex: [birth year] (60 y.o. F) Treating RN: Yevonne Pax Primary Care Jeric Slagel: Card, Jonny Ruiz Other Clinician: Referring [name]: Card, [name] Treating [name]/Extender: Rowan Blase in Treatment: [name] Visit Information History Since Last Visit All ordered tests and consults were completed: No Patient Arrived: [name] Added or deleted any medications: No Arrival Time: 12:40 Any new allergies or adverse reactions: No Accompanied By: self Had a fall or experienced change in No Transfer Assistance: None activities of daily living that may affect Patient Identification Verified: Yes risk of falls: Secondary Verification Process Completed: Yes Signs or symptoms of abuse/neglect since last visito No Patient Requires Transmission-Based Precautions: No Hospitalized since last visit: No Patient Has Alerts: No Implantable device outside of the clinic excluding No cellular tissue based products placed in the center since last visit: Has Dressing in Place as Prescribed: Yes Pain Present Now: Yes Electronic Signature(s) Signed: 12/05/2021 1:44:34 PM By: Yevonne Pax RN Entered By: Yevonne Pax on 11/20/2021 12:43:23 [name], [name] ([id number]) -------------------------------------------------------------------------------- Clinic Level of Care Assessment Details Patient Name: [name], [name] Date of Service: 11/20/2021 12:30 PM Medical Record Number: [id number] Patient Account Number: [id number] Date of Birth/Sex: 21-Jul-1961 (60 y.o. F) Treating RN: Yevonne Pax Primary Care Aziah Kaiser: Card, Jonny Ruiz Other Clinician: Referring Jsean Taussig: Card, [name] Clinic Level of Care Assessment  Items TOOL 4 Quantity Score X - Use when only an EandM is performed on FOLLOW-UP visit 1 0 ASSESSMENTS - Nursing Assessment / Reassessment []  - Reassessment of Co-morbidities (includes updates in patient status) 0 []  - 0 Reassessment of Adherence to Treatment Plan ASSESSMENTS - Wound and Skin Assessment / Reassessment X - Simple Wound Assessment / Reassessment - one wound 1 [name] []  - 0 Complex Wound Assessment / Reassessment - multiple wounds []  - 0 Dermatologic / Skin Assessment (not related to wound area) ASSESSMENTS - Focused Assessment []  - Circumferential Edema Measurements - multi extremities 0 []  - 0 Nutritional Assessment / Counseling / Intervention []  - 0 Lower Extremity Assessment (monofilament, tuning fork, pulses) []  - 0 Peripheral Arterial Disease Assessment (using hand held doppler) ASSESSMENTS - Ostomy and/or Continence Assessment and Care []  - Incontinence Assessment and Management 0 []  - 0 Ostomy Care Assessment and Management (repouching, etc.) PROCESS - Coordination of Care X - Simple Patient / Family Education for ongoing care 1 15 []  - 0 Complex (extensive) Patient / Family Education for ongoing care X- 1 10 Staff obtains , Records, Test Results / Process Orders []  - 0 Staff telephones HHA, Nursing Homes / Clarify orders / etc []  - 0 Routine Transfer to another Facility (non-emergent condition) []  - 0 Routine Hospital Admission (non-emergent condition) []  - 0 New Admissions / / Ordering NPWT, Apligraf, etc. []  - 0 Emergency Hospital Admission (emergent condition) X- 1 10 Simple Discharge Coordination []  - 0 Complex (extensive) Discharge Coordination PROCESS - Special Needs []  - Pediatric / Minor Patient Management 0 []  - 0 Isolation Patient Management []  - 0 Hearing / Language / Visual special needs []  - 0 Assessment of Community assistance (transportation, D/[name] planning, etc.) []  - 0 Additional assistance /  Altered mentation []  - 0 Support Surface(s) Assessment (bed, cushion, seat, etc.) INTERVENTIONS - Wound Cleansing / Measurement [name], [name] ( ) X- 1 [name] Simple Wound Cleansing - one  wound  - 0 Complex Wound Cleansing - multiple wounds X- 1 [name] Wound Imaging (photographs - any number of wounds)  - 0 Wound Tracing (instead of photographs) X- 1 [name] Simple Wound Measurement - one wound  - 0 Complex Wound Measurement - multiple wounds INTERVENTIONS - Wound Dressings  - Small Wound Dressing one or multiple wounds 0 X- 1 15 Medium Wound Dressing one or multiple wounds  - 0 Large Wound Dressing one or multiple wounds  - 0 Application of Medications - topical  - 0 Application of Medications - injection INTERVENTIONS - Miscellaneous  - External ear exam 0  - 0 Specimen Collection (cultures, biopsies, blood, body fluids, etc.)  - 0 Specimen(s) / Culture(s) sent or taken to Lab for analysis  - 0 Patient Transfer (multiple staff / Nurse, adult / Similar devices)  - 0 Simple Staple / Suture removal (25 or less)  - 0 Complex Staple / Suture removal (26 or more)  - 0 Hypo / Hyperglycemic Management (close monitor of Blood Glucose)  - 0 Ankle / Brachial Index (ABI) - do not check if billed separately X- 1 [name] Vital Signs Has the patient been seen at the hospital within the last three years: Yes Total Score: 75 Level Of Care: New/Established - Level 2 Electronic Signature(s) Signed: 12/05/2021 1:44:34 PM By: Yevonne Pax RN Entered By: Yevonne Pax on 11/20/2021 13:23:42 [name], [name] ([id number]) -------------------------------------------------------------------------------- Encounter Discharge Information Details Patient Name: [name] Date of Service: 11/20/2021 12:30 PM Medical Record Number: [id number] Patient Account Number: [id number] Date of Birth/Sex: 12-11-1961 (60 y.o. F) Treating RN: Yevonne Pax Primary Care  Mary Hockey: [name] Other Clinician: Referring Zaryia Markel: Card, [name] Treating Tonni Mansour/Extender: Rowan Blase in Treatment: [name] Encounter Discharge Information Items Discharge Condition: Stable Ambulatory Status: Walker Discharge Destination: Home Transportation: Private Auto Accompanied By: self Schedule Follow-up Appointment: Yes Clinical Summary of Care: Patient Declined Electronic Signature(s) Signed: 12/05/2021 1:44:34 PM By: Yevonne Pax RN Entered By: Yevonne Pax on 11/20/2021 13:24:43 [name], [name] ([id number]) -------------------------------------------------------------------------------- Lower Extremity Assessment Details Patient Name: Furney, Dwana [name]. Date of Service: 11/20/2021 12:30 PM Medical Record Number: [id number] Patient Account Number: [id number] Date of Birth/Sex: [birth year] (60 y.o. F) Treating RN: Yevonne Pax Primary Care Raetta Agostinelli: [name] Other Clinician: Referring Clydean Posas: Card, [name] Treating Julieana Eshleman/Extender: Rowan Blase in Treatment: [name] Electronic Signature(s) Signed: 12/05/2021 1:44:34 PM By: Yevonne Pax RN Entered By: Yevonne Pax on 11/20/2021 12:55:02 Faas, Letzy CMarland Kitchen ([id number]) -------------------------------------------------------------------------------- Multi Wound Chart Details Patient Name: Reen, Lexxi [name]. Date of Service: 11/20/2021 12:30 PM Medical Record Number: [id number] Patient Account Number: [id number] Date of Birth/Sex: 1961/02/14 (60 y.o. F) Treating RN: Yevonne Pax Primary Care Siyana Erney: [name] Other Clinician: Referring Minahil Quinlivan: Card, [name] Treating Fallynn Gravett/Extender: Rowan Blase in Treatment: [name] Vital Signs Height(in): 63 Pulse(bpm): 102 Weight(lbs): 275 Blood Pressure(mmHg): 162/98 Body Mass Index(BMI): 49 Temperature(F): 98.2 Respiratory Rate(breaths/min): 18 Photos: [N/A:N/A] Wound Location: Distal, Midline Back N/A N/A Wounding Event: Surgical Injury N/A N/A Primary  Etiology: Dehisced Wound N/A N/A Comorbid History: Hypertension N/A N/A Date Acquired: 04/11/2021 N/A N/A Weeks of Treatment: [name] N/A N/A Wound Status: Open N/A N/A Measurements L x W x D (cm) 0.7x0.4x4.7 N/A N/A Area (cm) : 0.22 N/A N/A Volume (cm) : 1.034 N/A N/A % Reduction in Area: -74.60% N/A N/A % Reduction in Volume: -78.90% N/A N/A Classification: Full Thickness Without Exposed N/A N/A Support Structures Exudate Amount: Large N/A N/A Exudate Type: Serosanguineous N/A N/A Exudate Color: red, brown N/A N/A  Granulation Amount: Large (67-100%) N/A N/A Granulation Quality: Red N/A N/A Necrotic Amount: None Present (0%) N/A N/A Exposed Structures: Fat Layer (Subcutaneous Tissue): N/A N/A Yes Fascia: No Tendon: No Muscle: No Joint: No Bone: No Epithelialization: None N/A N/A Treatment Notes Electronic Signature(s) Signed: 12/05/2021 1:44:34 PM By: Yevonne Pax RN Entered By: Yevonne Pax on 11/20/2021 13:19:51 [name], [name] ([id number]) -------------------------------------------------------------------------------- Multi-Disciplinary Care Plan Details Patient Name: Stanly, Tyneshia [name]. Date of Service: 11/20/2021 12:30 PM Medical Record Number: [id number] Patient Account Number: [id number] Date of Birth/Sex: June 09, 1961 (60 y.o. F) Treating RN: Yevonne Pax Primary Care Beniah Magnan: [name] Other Clinician: Referring Michalina Calbert: Card, [name] Treating Clara Herbison/Extender: Rowan Blase in Treatment: [name] Active Inactive Wound/Skin Impairment Nursing Diagnoses: Knowledge deficit related to ulceration/compromised skin integrity Goals: Patient/caregiver will verbalize understanding of skin care regimen Date Initiated: 10/15/2021 Target Resolution Date: 11/15/2021 Goal Status: Active Ulcer/skin breakdown will have a volume reduction of 30% by week 4 Date Initiated: 10/15/2021 Target Resolution Date: 12/15/2021 Goal Status: Active Ulcer/skin breakdown will have a volume  reduction of 50% by week 8 Date Initiated: 10/15/2021 Target Resolution Date: 01/15/2022 Goal Status: Active Ulcer/skin breakdown will have a volume reduction of 80% by week 12 Date Initiated: 10/15/2021 Target Resolution Date: 02/15/2022 Goal Status: Active Ulcer/skin breakdown will heal within 14 weeks Date Initiated: 10/15/2021 Target Resolution Date: 03/15/2022 Goal Status: Active Interventions: Assess patient/caregiver ability to obtain necessary supplies Assess patient/caregiver ability to perform ulcer/skin care regimen upon admission and as needed Assess ulceration(s) every visit Notes: Electronic Signature(s) Signed: 12/05/2021 1:44:34 PM By: Yevonne Pax RN Entered By: Yevonne Pax on 11/20/2021 13:19:40 [name], [name] ([id number]) -------------------------------------------------------------------------------- Pain Assessment Details Patient Name: [name], [name] Date of Service: 11/20/2021 12:30 PM Medical Record Number: [id number] Patient Account Number: [id number] Date of Birth/Sex: [birth year] (60 y.o. F) Treating RN: Yevonne Pax Primary Care Ellsie Violette: [name] Other Clinician: Referring Naftuli Dalsanto: Card, [name] Treating Marlynn Hinckley/Extender: Rowan Blase in Treatment: [name] Active Problems Location of Pain Severity and Description of Pain Patient Has Paino Yes Site Locations With Dressing Change: Yes Duration of the Pain. Constant / Intermittento Constant Rate the pain. Current Pain Level: 3 Worst Pain Level: 6 Least Pain Level: [name] Tolerable Pain Level: 2 Character of Pain Describe the Pain: Aching Pain Management and Medication Current Pain Management: Medication: Yes Cold Application: No Rest: Yes Massage: No Activity: No T.E.N.S.: No Heat Application: No Leg drop or elevation: No Is the Current Pain Management Adequate: Inadequate How does your wound impact your activities of daily livingo Sleep: No Bathing: No Appetite: No Relationship With  Others: No Bladder Continence: No Emotions: No Bowel Continence: No Work: No Toileting: No Drive: No Dressing: No Hobbies: No Electronic Signature(s) Signed: 12/05/2021 1:44:34 PM By: Yevonne Pax RN Entered By: Yevonne Pax on 11/20/2021 12:44:22 [name], [name] ([id number]) -------------------------------------------------------------------------------- Patient/Caregiver Education Details Patient Name: [name], [name] Date of Service: 11/20/2021 12:30 PM Medical Record Number: [id number] Patient Account Number: [id number] Date of Birth/Gender: June 20, 1961 (60 y.o. F) Treating RN: Yevonne Pax Primary Care Physician: [name] Other Clinician: Referring Physician: Card, [name] Treating Physician/Extender: Rowan Blase in Treatment: [name] Education Assessment Education Provided To: Patient Education Topics Provided Wound/Skin Impairment: Methods: Explain/Verbal Responses: State content correctly Electronic Signature(s) Signed: 12/05/2021 1:44:34 PM By: Yevonne Pax RN Entered By: Yevonne Pax on 11/20/2021 13:23:57 [name], [name] ([id number]) -------------------------------------------------------------------------------- Wound Assessment Details Patient Name: [name], Carlea [name]. Date of Service: 11/20/2021 12:30 PM Medical Record Number: [id number] Patient Account Number: [id number] Date of Birth/Sex:  1961/01/24 (60 y.o. F) Treating RN: Yevonne Pax Primary Care Tigran Haynie: Card, Jonny Ruiz Other Clinician: Referring Delaynee Alred: Card, [name] Treating Jajuan Skoog/Extender: Rowan Blase in Treatment: [name] Wound Status Wound Number: 1 Primary Etiology: Dehisced Wound Wound Location: Distal, Midline Back Wound Status: Open Wounding Event: Surgical Injury Comorbid History: Hypertension Date Acquired: 04/11/2021 Weeks Of Treatment: [name] Clustered Wound: No Photos Wound Measurements Length: (cm) 0.7 Width: (cm) 0.4 Depth: (cm) 4.7 Area: (cm) 0.22 Volume: (cm) 1.034 %  Reduction in Area: -74.6% % Reduction in Volume: -78.9% Epithelialization: None Tunneling: No Undermining: Yes Starting Position (o'clock): 12 Ending Position (o'clock): 6 Maximum Distance: (cm) 3.[name] Wound Description Classification: Full Thickness Without Exposed Support Structu Exudate Amount: Large Exudate Type: Serosanguineous Exudate Color: red, brown res Foul Odor After Cleansing: No Slough/Fibrino Yes Wound Bed Granulation Amount: Large (67-100%) Exposed Structure Granulation Quality: Red Fascia Exposed: No Necrotic Amount: None Present (0%) Fat Layer (Subcutaneous Tissue) Exposed: Yes Tendon Exposed: No Muscle Exposed: No Joint Exposed: No Bone Exposed: No Electronic Signature(s) Signed: 12/05/2021 1:44:34 PM By: Yevonne Pax RN Entered By: Yevonne Pax on 11/20/2021 13:22:17 Hauswirth, Celes [name]. ([id number]) -------------------------------------------------------------------------------- Vitals Details Patient Name: Furno, Nimah [name]. Date of Service: 11/20/2021 12:30 PM Medical Record Number: [id number] Patient Account Number: [id number] Date of Birth/Sex: February 27, 1961 (60 y.o. F) Treating RN: Yevonne Pax Primary Care Trestin Vences: [name] Other Clinician: Referring Crystalee Ventress: Card, [name] Treating Iyahna Obriant/Extender: Rowan Blase in Treatment: [name] Vital Signs Time Taken: 12:43 Temperature (F): 98.2 Height (in): 63 Pulse (bpm): 102 Weight (lbs): 275 Respiratory Rate (breaths/min): 18 Body Mass Index (BMI): 48.7 Blood Pressure (mmHg): 162/98 Reference Range: 80 - 120 mg / dl Electronic Signature(s) Signed: 12/05/2021 1:44:34 PM By: Yevonne Pax RN Entered By: Yevonne Pax on 11/20/2021 12:43:52

## 2021-12-06 ENCOUNTER — Other Ambulatory Visit: Payer: Self-pay

## 2021-12-06 DIAGNOSIS — T8131XA Disruption of external operation (surgical) wound, not elsewhere classified, initial encounter: Secondary | ICD-10-CM | POA: Diagnosis not present

## 2021-12-06 NOTE — Progress Notes (Signed)
ADRIEANA, FENNELLY ([id number]) Visit Report for 12/06/2021 Arrival Information Details Patient Name: [name], [name] Date of Service: 12/06/2021 11:15 AM Medical Record Number: [id number] Patient Account Number: [id number] Date of Birth/Sex: 1961-09-08 (60 y.o. F) Treating RN: Hansel Feinstein Primary Care Mayfield Schoene: [name] Other Clinician: Referring Paisly Fingerhut: Card, John Treating Lawrence Mitch/Extender: Rowan Blase in Treatment: 7 Visit Information History Since Last Visit Added or deleted any medications: No Patient Arrived: [name] Had a fall or experienced change in No Arrival Time: 11:17 activities of daily living that may affect Accompanied By: self risk of falls: Transfer Assistance: None Hospitalized since last visit: No Patient Identification Verified: Yes Has Dressing in Place as Prescribed: Yes Secondary Verification Process Completed: Yes Pain Present Now: Yes Patient Requires Transmission-Based Precautions: No Patient Has Alerts: No Electronic Signature(s) Signed: 12/06/2021 4:44:26 PM By: Hansel Feinstein Entered By: Hansel Feinstein on 12/06/2021 11:27:23 Scheu, Eppie CMarland Kitchen ([id number]) -------------------------------------------------------------------------------- Clinic Level of Care Assessment Details Patient Name: Feagans, Khiya C. Date of Service: 12/06/2021 11:15 AM Medical Record Number: [id number] Patient Account Number: [id number] Date of Birth/Sex: 06/19/1961 (60 y.o. F) Treating RN: Hansel Feinstein Primary Care Janyiah Silveri: CardJonny Ruiz Other Clinician: Referring Jame Seelig: Card, John Treating Lorrane Mccay/Extender: Rowan Blase in Treatment: 7 Clinic Level of Care Assessment Items TOOL 4 Quantity Score []  - Use when only an EandM is performed on FOLLOW-UP visit 0 ASSESSMENTS - Nursing Assessment / Reassessment []  - Reassessment of Co-morbidities (includes updates in patient status) 0 []  - 0 Reassessment of Adherence to Treatment Plan ASSESSMENTS - Wound and Skin  Assessment / Reassessment X - Simple Wound Assessment / Reassessment - one wound 1 5 []  - 0 Complex Wound Assessment / Reassessment - multiple wounds []  - 0 Dermatologic / Skin Assessment (not related to wound area) ASSESSMENTS - Focused Assessment []  - Circumferential Edema Measurements - multi extremities 0 []  - 0 Nutritional Assessment / Counseling / Intervention []  - 0 Lower Extremity Assessment (monofilament, tuning fork, pulses) []  - 0 Peripheral Arterial Disease Assessment (using hand held doppler) ASSESSMENTS - Ostomy and/or Continence Assessment and Care []  - Incontinence Assessment and Management 0 []  - 0 Ostomy Care Assessment and Management (repouching, etc.) PROCESS - Coordination of Care X - Simple Patient / Family Education for ongoing care 1 15 []  - 0 Complex (extensive) Patient / Family Education for ongoing care []  - 0 Staff obtains , Records, Test Results / Process Orders []  - 0 Staff telephones HHA, Nursing Homes / Clarify orders / etc []  - 0 Routine Transfer to another Facility (non-emergent condition) []  - 0 Routine Hospital Admission (non-emergent condition) []  - 0 New Admissions / / Ordering NPWT, Apligraf, etc. []  - 0 Emergency Hospital Admission (emergent condition) X- 1 10 Simple Discharge Coordination []  - 0 Complex (extensive) Discharge Coordination PROCESS - Special Needs []  - Pediatric / Minor Patient Management 0 []  - 0 Isolation Patient Management []  - 0 Hearing / Language / Visual special needs []  - 0 Assessment of Community assistance (transportation, D/C planning, etc.) []  - 0 Additional assistance / Altered mentation []  - 0 Support Surface(s) Assessment (bed, cushion, seat, etc.) INTERVENTIONS - Wound Cleansing / Measurement Offerdahl, Avaeh C. ( ) []  - 0 Simple Wound Cleansing - one wound []  - 0 Complex Wound Cleansing - multiple wounds []  - 0 Wound Imaging (photographs - any number  of wounds) []  - 0 Wound Tracing (instead of photographs) []  - 0 Simple Wound Measurement - one wound []  - 0 Complex Wound Measurement -  multiple wounds INTERVENTIONS - Wound Dressings X - Small Wound Dressing one or multiple wounds 1 10 []  - 0 Medium Wound Dressing one or multiple wounds []  - 0 Large Wound Dressing one or multiple wounds []  - 0 Application of Medications - topical []  - 0 Application of Medications - injection INTERVENTIONS - Miscellaneous []  - External ear exam 0 []  - 0 Specimen Collection (cultures, biopsies, blood, body fluids, etc.) []  - 0 Specimen(s) / Culture(s) sent or taken to Lab for analysis []  - 0 Patient Transfer (multiple staff / Lift / Similar devices) []  - 0 Simple Staple / Suture removal (25 or less) []  - 0 Complex Staple / Suture removal (26 or more) []  - 0 Hypo / Hyperglycemic Management (close monitor of Blood Glucose) []  - 0 Ankle / Brachial Index (ABI) - do not check if billed separately []  - 0 Vital Signs Has the patient been seen at the hospital within the last three years: Yes Total Score: 40 Level Of Care: New/Established - Level 2 Electronic Signature(s) Signed: 12/06/2021 4:44:26 PM By: Entered By: on 12/06/2021 11:29:08 Shukla, Reneisha C ( ) -------------------------------------------------------------------------------- Encounter Discharge Information Details Patient Name: Sindt, Zeya C. Date of Service: 12/06/2021 11:15 AM Medical Record Number: Michiel Sites Patient Account Number: Date of Birth/Sex: 1961/02/24 (60 y.o. F) Treating RN: Primary Care Ashlee Player: Other Clinician: Referring Reshad Saab: Card, John Treating Latrisa Hellums/Extender: 14/07/2021 in Treatment: 7 Encounter Discharge Information Items Discharge Condition: Stable Ambulatory Status: Walker Discharge Destination: Home Transportation: Private Auto Accompanied By:  self Schedule Follow-up Appointment: Yes Clinical Summary of Care: Electronic Signature(s) Signed: 12/06/2021 4:44:26 PM By: Hansel Feinstein Entered By: 14/07/2021 on 12/06/2021 11:28:42 Wain, Jearldine C. ([id number]) -------------------------------------------------------------------------------- Wound Assessment Details Patient Name: Dinges, Rusty C. Date of Service: 12/06/2021 11:15 AM Medical Record Number: [id number] Patient Account Number: [id number] Date of Birth/Sex: [birth year] (61 y.o. F) Treating RN: Hansel Feinstein Primary Care Daxen Lanum: CardChristena Flake Other Clinician: Referring Larron Armor: Card, John Treating Fay Bagg/Extender: Rowan Blase in Treatment: 7 Wound Status Wound Number: 1 Primary Etiology: Dehisced Wound Wound Location: Distal, Midline Back Wound Status: Open Wounding Event: Surgical Injury Comorbid History: Hypertension Date Acquired: 04/11/2021 Weeks Of Treatment: 7 Clustered Wound: No Wound Measurements Length: (cm) 0.6 Width: (cm) 0.3 Depth: (cm) 4 Area: (cm) 0.141 Volume: (cm) 0.565 % Reduction in Area: -11.9% % Reduction in Volume: 2.2% Epithelialization: None Wound Description Classification: Full Thickness Without Exposed Support Structu Exudate Amount: Large Exudate Type: Serosanguineous Exudate Color: red, brown res Foul Odor After Cleansing: No Slough/Fibrino No Wound Bed Granulation Amount: Large (67-100%) Exposed Structure Granulation Quality: Red Fascia Exposed: No Necrotic Amount: None Present (0%) Fat Layer (Subcutaneous Tissue) Exposed: Yes Tendon Exposed: No Muscle Exposed: No Joint Exposed: No Bone Exposed: No Treatment Notes Wound #1 (Back) Wound Laterality: Midline, Distal Cleanser Normal Saline Discharge Instruction: Wash your hands with soap and water. Remove old dressing, discard into plastic bag and place into trash. Cleanse the wound with Normal Saline prior to applying a clean dressing using gauze sponges, not  tissues or cotton balls. Do not scrub or use excessive force. Pat dry using gauze sponges, not tissue or cotton balls. Peri-Wound Care Topical Primary Dressing Hydrofera Blue Classic Foam Rope Dressing, 9x6 (mm/in) Secondary Dressing Zetuvit Plus Silicone Non-bordered 5x5 (in/in) Secured With Compression Wrap Compression Stockings Add-Ons TYSHAWNA, ALARID (Hansel Feinstein) Electronic Signature(s) Signed: 12/06/2021 4:44:26 PM By: [id number] Entered By14/07/2021 on 12/06/2021 11:27:41

## 2021-12-07 NOTE — Progress Notes (Signed)
MINNAH, LLAMAS ([id number]) Visit Report for 12/06/2021 Physician Orders Details Patient Name: [name], [name] Date of Service: 12/06/2021 11:15 AM Medical Record Number: [id number] Patient Account Number: [id number] Date of Birth/Sex: [birth year] (60 y.o. F) Treating RN: Hansel Feinstein Primary Care Provider: CardJonny Ruiz Other Clinician: Referring Provider: Card, John Treating Provider/Extender: Rowan Blase in Treatment: 7 Verbal / Phone Orders: No Diagnosis Coding Follow-up Appointments o Return Appointment in 1 week. o Nurse Visit as needed - nurse visit on thursday Home Health o [facility] for wound care. May utilize formulary equivalent dressing for wound treatment orders unless otherwise specified. Home Health Nurse may visit PRN to address patientos wound care needs. - 3 times per week [facility] fax [phone] Bathing/ Shower/ Hygiene o May shower; gently cleanse wound with antibacterial soap, rinse and pat dry prior to dressing wounds Edema Control - Lymphedema / Segmental Compressive Device / Other o Elevate, Exercise Daily and Avoid Standing for Long Periods of Time. o Elevate legs to the level of the heart and pump ankles as often as possible o Elevate leg(s) parallel to the floor when sitting. Wound Treatment Wound #1 - Back Wound Laterality: Midline, Distal Cleanser: Normal Saline 1 x Per Day/30 Days Discharge Instructions: Wash your hands with soap and water. Remove old dressing, discard into plastic bag and place into trash. Cleanse the wound with Normal Saline prior to applying a clean dressing using gauze sponges, not tissues or cotton balls. Do not scrub or use excessive force. Pat dry using gauze sponges, not tissue or cotton balls. Primary Dressing: Hydrofera Blue Classic Foam Rope Dressing, 9x6 (mm/in) 1 x Per Day/30 Days Discharge Instructions: cut rope in half lengthwise Secondary Dressing: Zetuvit Plus Silicone Non-bordered 5x5  (in/in) 1 x Per Day/30 Days Electronic Signature(s) Signed: 12/06/2021 4:44:26 PM By: Hansel Feinstein Signed: 12/06/2021 6:23:09 PM By: [name] PA-C Entered By: Hansel Feinstein on 12/06/2021 11:31:49 [name], [name] ([id number]) -------------------------------------------------------------------------------- SuperBill Details Patient Name: [name], [name] Date of Service: 12/06/2021 Medical Record Number: [id number] Patient Account Number: [id number] Date of Birth/Sex: 24-Mar-1961 (60 y.o. 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

## 2021-12-11 ENCOUNTER — Encounter: Payer: 59 | Admitting: Physician Assistant

## 2021-12-11 ENCOUNTER — Other Ambulatory Visit: Payer: Self-pay

## 2021-12-11 DIAGNOSIS — T8131XA Disruption of external operation (surgical) wound, not elsewhere classified, initial encounter: Secondary | ICD-10-CM | POA: Diagnosis not present

## 2021-12-11 NOTE — Progress Notes (Addendum)
[name], [name] ([id number]) Visit Report for 12/11/2021 Chief Complaint Document Details Patient Name: [name], [name] Date of Service: 12/11/2021 12:30 PM Medical Record Number: [id number] Patient Account Number: [id number] Date of Birth/Sex: 1961/02/26 (60 y.o. F) Treating RN: Yevonne Pax Primary Care Provider: [name] Other Clinician: Referring Provider: Card, John Treating Provider/Extender: Rowan Blase in Treatment: 8 Information Obtained from: Patient Chief Complaint Surgical Back Ulcer Electronic Signature(s) Signed: 12/11/2021 1:00:14 PM By: [name] PA-C Entered By: [name] on 12/11/2021 13:00:13 [place], [name] ([id number]) -------------------------------------------------------------------------------- HPI Details Patient Name: [name], [name] Date of Service: 12/11/2021 12:30 PM Medical Record Number: [id number] Patient Account Number: [id number] Date of Birth/Sex: Oct 22, 1961 (60 y.o. F) Treating RN: Yevonne Pax Primary Care Provider: [name] Other Clinician: Referring Provider: Card, John Treating Provider/Extender: Rowan Blase in Treatment: 8 History of Present Illness HPI Description: 10/15/2021 upon evaluation today patient presents for initial evaluation here in the clinic concerning a surgical ulceration/dehiscence in the lumbar spine region following surgery that she had over the past year. This was actually broken up into 3 separate surgical events. The initial surgical intervention actually was on November 05, 2021 almost a year ago. Subsequently the patient went back in February for a seroma of the area which unfortunately required her to have a repeat surgery to go in and clean this out. And then again this occurred in April where she went back in and again they felt like stitches were coming out and there was an additional seroma. She was placed in a wound VAC initially and then subsequently as it got smaller  that was discontinued. Again right now I will see anything that I think a wound VAC would help with. Nonetheless she definitely has a significant depth to the wound that is going require packing. I actually believe the Hydrofera Blue rope would probably do quite well with this the problem is as much as it is draining she probably needs this to be changed at least every day. She does not really have anyone that can help with that that is the complicating scenario here. With that being said the patient does have a history of multiple sclerosis, hypertension, and again this surgical wound dehiscence in regard to her lumbar spine region. She did have a repeat MRI which was actually completed 10/09/2021. This showed that there was no significant change in the subcutaneous fluid collection/track of the lower lumbar region. This is extending to the level of the fascia unfortunately. This seems to go all the way from the L2 level with a track extending all the way to the fascia at the L4-5 level. Again this is a significant wound and there is significant drainage but does not seem to communicate to the spinal region as far as spinal fluid or otherwise is concerned that is good news. Nonetheless she last saw Dr. Franky Macho who is her neurosurgeon on 10/01/2021 that was when he ordered this last MRI she supposed to see him next week as well. With that being said he did not feel like there was any significant issue there but was not sure why this was not healing that is when he ordered the MRI. They were wanting to make sure that this was packed appropriately by home health unfortunately the main issue currently is that home health is completely out of the picture as the patient has exhausted all the home health that that she gets for a year. She is now in a very difficult  predicament where she does not have anyone to help her change the dressing and to be honest that she is not able to do it herself with the location  of the wound being on the midline lumbar spine region. If she does not have anyone that can help it is probably can to be necessary for her to go to a facility for rehab and daily dressing changes as I feel like daily changes which is much drainage that she is having is going to be necessary. 10/23/2021 upon evaluation today patient appears to be doing decently well in regard to her back ulcer. This does seem to be draining a lot less than what it is been doing in the past. With that being said she still has quite a bit of drainage nonetheless. I do think that given time this should improve least I hope so. The good news is she does have home health coming out 3 days a week were doing it 2 days a week and she is paying someone we can to help. 10/30/2021 upon evaluation today patient appears to be doing okay in regard to her back ulcer this is not draining quite as bad as it was in the beginning but he still has quite a bit of drainage noted. I do believe that the patient would benefit from [place] going ahead forward with attempting a wound VAC using the Hydrofera Blue rope to pack with and then subsequently using the VAC externally to actually suction out and help this to fill- in. I think this is our ideal way to try to get things cleared at this point. As it stands I am not certain that we are really making a progress that we want to see near with doing it in the way we are which is packing with the rope. It is a good dressing but I do think it is insufficient for total healing. She just seems to have too much in the way of drainage at this point unfortunately. 11/06/2021 upon evaluation today patient unfortunately continues to have issues with her back ongoing. The good news is her MRI that was repeated showed signs of the size of this area in the lumbar spine region having decreased from 4 cm to 3.5 cm this is definitely not bad news at all. With that being said unfortunately she continues to have  issues with ongoing drainage not as severe as in the beginning but nonetheless still significant. I do think a wound VAC still would be a good way to go although her home health agency nurse apparently has some concerns about the possibility of not being able to keep a seal with this as they had struggles in the past. Nonetheless I explained to the patient that this is much different than what she had previous and that I really feel like it would do much better as far as getting the area taken care of without having any complications or issues here. I think that we should be able to maintain a seal. Nonetheless at this time I did discuss with the patient as well that she probably does need to have a wound VAC in order for [place] to get this moving in the right direction. 11/13/2021 upon evaluation patient's wound bed actually showed signs of significant drainage at this time. She did see the surgeon yesterday he did not see anything that appeared to be infected. Nonetheless he does appear that she is continuing to have areas here that just do not seem  to want to seal up there MRI findings have been negative but nonetheless she continues to have is the seroma that is filling in. I do feel like we need to try to widen the hole so we can get at least a half of the [facility] then this will be better than nothing at this point. 11/20/2021 upon evaluation today patient actually appears to be having less pain at this point which is good news and overall she we still do not have the results of the culture back yet it had to be sent out to [facility] and we do not have the result back yet. Is doing decently well in regard to her wound. Fortunately there does not appear to be any signs of active infection systemically nor locally at this time. 11/27/2021 upon evaluation today patient appears to be doing well with regard to her wound all things considered there does appear to be less drainage than there was  previous. Fortunately I do not see any evidence of worsening of the patient is stating that she is having some issues with back pain. This is somewhat new. Again this I think could be related to the fact that she is having some issues here with infection. We are still waiting to see what the result of her culture shows from susceptibility testing Enterococcus has been identified but we do not know if this is VRE or not. 12/04/2021 upon evaluation today patient appears to be doing well with regard to her wound all things considered. I did have a conversation with Erin from Dr. Sueanne Margarita office. Of note she notes that Dr. Drinda Butts really does not want to do anything surgical right now which I completely understand. With that being said I am still leery of how far we will make it getting this to heal short of any type of surgery to open this up and allow [place] to more appropriately packed the wound. Nonetheless I will absolutely give it our best shot as far as that is concerned. I discussed that with the patient today. She voiced understanding. The good news is the drainage today seems to be clear it is no longer cloudy as it was previous I am actually very pleased in that regard. ALURA, OLVEDA ([id number]) 12/11/2021 since have last seen the patient actually did have an conversation with Dr. Franky Macho with her neurosurgeon. Again this involve the discussion around whether or not to open the wound and try to apply a wound VAC following. With that being said the decision was made that that may be the best thing to do if the patient was in agreement. Nonetheless I am actually extremely encouraged with what I am seeing today much more than I would have thought. In fact I think that we may be over packing the wound which is why she is having some discomfort at this point as there is much less of the dressing able to get and this time compared to what we saw previous. That is actually really good news. In  fact the 6:00 tunnel appears to have filled in is awesome news. At the 12:00 location I am actually able to pack into that area pretty effectively at this point today. In fact I was able to get less of the packing in which I will detail below. Electronic Signature(s) Signed: 12/11/2021 5:05:12 PM By: [name] PA-C Entered By: [name] on 12/11/2021 17:05:11 [name], [name] ([id number]) -------------------------------------------------------------------------------- Physical Exam Details Patient Name: [name], [name]  Date of Service: 12/11/2021 12:30 PM Medical Record Number: [id number] Patient Account Number: [id number] Date of Birth/Sex: 23-Feb-1961 (60 y.o. F) Treating RN: Yevonne Pax Primary Care Provider: [name] Other Clinician: Referring Provider: Card, John Treating Provider/Extender: Rowan Blase in Treatment: 8 Constitutional Obese and well-hydrated in no acute distress. Respiratory normal breathing without difficulty. Psychiatric this patient is able to make decisions and demonstrates good insight into disease process. Alert and Oriented x 3. pleasant and cooperative. Notes Upon inspection patient's wound bed actually showed signs of significant improvement I still feel the undermining area and tunneling at 12:00 but at 6:00 this is significantly improved. I am actually very pleased and I am hopeful we are making some progress here. Hopefully will not he may have to go the surgical route which would be awesome. Electronic Signature(s) Signed: 12/11/2021 5:05:32 PM By: [name] PA-C Entered By: [name] on 12/11/2021 17:05:32 [name], [name] ([id number]) -------------------------------------------------------------------------------- Physician Orders Details Patient Name: [name], [name] Date of Service: 12/11/2021 12:30 PM Medical Record Number: [id number] Patient Account Number: [id number] Date of Birth/Sex: February 09, 1961 (60  y.o. 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 o Nurse Visit as needed - nurse visit on thursday Home Health o [facility] for wound care. May utilize formulary equivalent dressing for wound treatment orders unless otherwise specified. Home Health Nurse may visit PRN to address patientos wound care needs. - 3 times per week [facility] fax [phone] Bathing/ Shower/ Hygiene o May shower; gently cleanse wound with antibacterial soap, rinse and pat dry prior to dressing wounds Edema Control - Lymphedema / Segmental Compressive Device / Other o Elevate, Exercise Daily and Avoid Standing for Long Periods of Time. o Elevate legs to the level of the heart and pump ankles as often as possible o Elevate leg(s) parallel to the floor when sitting. Wound Treatment Wound #1 - Back Wound Laterality: Midline, Distal Cleanser: Normal Saline 1 x Per Day/30 Days Discharge Instructions: Wash your hands with soap and water. Remove old dressing, discard into plastic bag and place into trash. Cleanse the wound with Normal Saline prior to applying a clean dressing using gauze sponges, not tissues or cotton balls. Do not scrub or use excessive force. Pat dry using gauze sponges, not tissue or cotton balls. Primary Dressing: Hydrofera Blue Classic Foam Rope Dressing, 9x6 (mm/in) 1 x Per Day/30 Days Discharge Instructions: cut rope in half lengthwise Secondary Dressing: Zetuvit Plus Silicone Non-bordered  5x5 (in/in) 1 x Per Day/30 Days Electronic Signature(s) Signed: 12/11/2021 5:37:18 PM By: [name] PA-C Signed: 12/12/2021 4:05:54 PM By: Yevonne Pax RN Entered By: Yevonne Pax on 12/11/2021 13:11:47 [name], [name] ([id number]) -------------------------------------------------------------------------------- Problem List Details Patient Name: [name], [name] Date of Service: 12/11/2021 12:30 PM Medical Record Number: [id number] Patient Account Number: [id number] Date of Birth/Sex: 1961/02/16 (60 y.o. 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 ([id number]) -------------------------------------------------------------------------------- Progress Note Details Patient Name: [name], [name] Date of Service: 12/11/2021 12:30 PM Medical Record Number: [id number] Patient Account Number: [id number] Date of Birth/Sex: 1961-01-18 (60 y.o. F) Treating RN: Yevonne Pax Primary Care Provider: [name] Other Clinician: Referring Provider: Card, John Treating Provider/Extender: Rowan Blase in Treatment: 8 Subjective Chief Complaint Information obtained from Patient Surgical Back Ulcer History of Present Illness (HPI) 10/15/2021 upon evaluation today patient presents for initial  evaluation here in the clinic concerning a surgical ulceration/dehiscence in the lumbar spine region following surgery that she had over the past year. This was actually broken up into 3 separate surgical events. The initial surgical intervention actually was on November 05, 2021 almost a year ago. Subsequently the patient went back in February for a seroma of the area which unfortunately required her to have a repeat surgery to go in and clean this out. And then again this occurred in April where she went back in and again they felt like stitches were coming out and there was an additional seroma. She was placed in a wound VAC initially and then subsequently as it got smaller that was discontinued. Again right now I will see anything that I think a wound VAC would help with. Nonetheless she definitely has a significant depth to the wound that is going require packing. I actually believe the Hydrofera Blue rope would probably do quite well with this the problem is as much as it is draining she probably needs this to be changed at least every day. She does not really have anyone that can help with that that is the complicating scenario here. With that being said the patient does have a history of multiple sclerosis, hypertension, and again this surgical wound dehiscence in regard to her lumbar spine region. She did have a repeat MRI which was actually completed 10/09/2021. This showed that there was no significant change in the subcutaneous fluid collection/track of the lower lumbar region. This is extending to the level of the fascia unfortunately. This seems to go all the way from the L2 level with a track extending all the way to the fascia at the L4-5 level. Again this is a significant wound and there is significant drainage but does not seem to communicate to the spinal region as far as spinal fluid or otherwise is concerned that is good news. Nonetheless she last saw Dr. Franky Macho who is her  neurosurgeon on 10/01/2021 that was when he ordered this last MRI she supposed to see him next week as well. With that being said he did not feel like there was any significant issue there but was not sure why this was not healing that is when he ordered the MRI. They were wanting to make sure that this was packed appropriately by home health unfortunately the main issue currently is that home health is completely out of the picture as the patient has exhausted all the home health that that she gets for a year. She is now in a very difficult predicament where she does not have anyone to help her change the dressing and to be honest that she is not able to do it herself with the  location of the wound being on the midline lumbar spine region. If she does not have anyone that can help it is probably can to be necessary for her to go to a facility for rehab and daily dressing changes as I feel like daily changes which is much drainage that she is having is going to be necessary. 10/23/2021 upon evaluation today patient appears to be doing decently well in regard to her back ulcer. This does seem to be draining a lot less than what it is been doing in the past. With that being said she still has quite a bit of drainage nonetheless. I do think that given time this should improve least I hope so. The good news is she does have home health coming out 3 days a week were doing it 2 days a week and she is paying someone we can to help. 10/30/2021 upon evaluation today patient appears to be doing okay in regard to her back ulcer this is not draining quite as bad as it was in the beginning but he still has quite a bit of drainage noted. I do believe that the patient would benefit from [place] going ahead forward with attempting a wound VAC using the Hydrofera Blue rope to pack with and then subsequently using the VAC externally to actually suction out and help this to fill- in. I think this is our ideal way to try to get  things cleared at this point. As it stands I am not certain that we are really making a progress that we want to see near with doing it in the way we are which is packing with the rope. It is a good dressing but I do think it is insufficient for total healing. She just seems to have too much in the way of drainage at this point unfortunately. 11/06/2021 upon evaluation today patient unfortunately continues to have issues with her back ongoing. The good news is her MRI that was repeated showed signs of the size of this area in the lumbar spine region having decreased from 4 cm to 3.5 cm this is definitely not bad news at all. With that being said unfortunately she continues to have issues with ongoing drainage not as severe as in the beginning but nonetheless still significant. I do think a wound VAC still would be a good way to go although her home health agency nurse apparently has some concerns about the possibility of not being able to keep a seal with this as they had struggles in the past. Nonetheless I explained to the patient that this is much different than what she had previous and that I really feel like it would do much better as far as getting the area taken care of without having any complications or issues here. I think that we should be able to maintain a seal. Nonetheless at this time I did discuss with the patient as well that she probably does need to have a wound VAC in order for [place] to get this moving in the right direction. 11/13/2021 upon evaluation patient's wound bed actually showed signs of significant drainage at this time. She did see the surgeon yesterday he did not see anything that appeared to be infected. Nonetheless he does appear that she is continuing to have areas here that just do not seem to want to seal up there MRI findings have been negative but nonetheless she continues to have is the seroma that is filling in. I do feel like we  need to try to widen the hole so we  can get at least a half of the [facility] then this will be better than nothing at this point. 11/20/2021 upon evaluation today patient actually appears to be having less pain at this point which is good news and overall she we still do not have the results of the culture back yet it had to be sent out to [facility] and we do not have the result back yet. Is doing decently well in regard to her wound. Fortunately there does not appear to be any signs of active infection systemically nor locally at this time. 11/27/2021 upon evaluation today patient appears to be doing well with regard to her wound all things considered there does appear to be less drainage than there was previous. Fortunately I do not see any evidence of worsening of the patient is stating that she is having some issues with back pain. This is somewhat new. Again this I think could be related to the fact that she is having some issues here with infection. We are still waiting to see what the result of her culture shows from susceptibility testing Enterococcus has been identified but we do not know if this is VRE or not. 12/04/2021 upon evaluation today patient appears to be doing well with regard to her wound all things considered. I did have a conversation with Erin from Dr. Sueanne Margarita office. Of note she notes that Dr. Drinda Butts really does not want to do anything surgical right now which I completely Foot, Adamariz C. ([id number]) understand. With that being said I am still leery of how far we will make it getting this to heal short of any type of surgery to open this up and allow [place] to more appropriately packed the wound. Nonetheless I will absolutely give it our best shot as far as that is concerned. I discussed that with the patient today. She voiced understanding. The good news is the drainage today seems to be clear it is no longer cloudy as it was previous I am actually very pleased in that regard. 12/11/2021 since have last  seen the patient actually did have an conversation with Dr. Franky Macho with her neurosurgeon. Again this involve the discussion around whether or not to open the wound and try to apply a wound VAC following. With that being said the decision was made that that may be the best thing to do if the patient was in agreement. Nonetheless I am actually extremely encouraged with what I am seeing today much more than I would have thought. In fact I think that we may be over packing the wound which is why she is having some discomfort at this point as there is much less of the dressing able to get and this time compared to what we saw previous. That is actually really good news. In fact the 6:00 tunnel appears to have filled in is awesome news. At the 12:00 location I am actually able to pack into that area pretty effectively at this point today. In fact I was able to get less of the packing in which I will detail below. Objective Constitutional Obese and well-hydrated in no acute distress. Vitals Time Taken: 12:43 PM, Weight: 275 lbs, Temperature: 98.1 F, Pulse: 92 bpm, Respiratory Rate: 18 breaths/min, Blood Pressure: 172/92 mmHg. Respiratory normal breathing without difficulty. Psychiatric this patient is able to make decisions and demonstrates good insight into disease process. Alert and Oriented x 3. pleasant and cooperative. General Notes:  Upon inspection patient's wound bed actually showed signs of significant improvement I still feel the undermining area and tunneling at 12:00 but at 6:00 this is significantly improved. I am actually very pleased and I am hopeful we are making some progress here. Hopefully will not he may have to go the surgical route which would be awesome. Integumentary (Hair, Skin) Wound #1 status is Open. Original cause of wound was Surgical Injury. The date acquired was: 04/11/2021. The wound has been in treatment 8 weeks. The wound is located on the Distal,Midline Back. The  wound measures 0.5cm length x 0.4cm width x 3cm depth; 0.157cm^2 area and 0.471cm^3 volume. There is Fat Layer (Subcutaneous Tissue) exposed. There is no undermining noted, however, there is tunneling at 12:00 with a maximum distance of 3.7cm. There is additional tunneling and at 6:00 with a maximum distance of 1.3cm. There is a large amount of serosanguineous drainage noted. There is large (67-100%) red granulation within the wound bed. There is no necrotic tissue within the wound bed. 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 Nurse Visit as needed - nurse visit on thursday Home Health: [facility] for wound care. May utilize formulary equivalent dressing for wound treatment orders unless otherwise specified. Home Health Nurse may visit PRN to address patient s wound care needs. - 3 times per week [facility] fax [phone] SHIRLEYMAE, HAUTH ([id number]) Bathing/ Shower/ Hygiene: May shower; gently cleanse wound with antibacterial soap, rinse and pat dry prior to dressing wounds Edema Control - Lymphedema / Segmental Compressive Device / Other: Elevate, Exercise Daily and Avoid Standing for Long Periods of Time. Elevate legs to the level of the heart and pump ankles as often as possible Elevate leg(s) parallel to the floor when sitting. WOUND #1: - Back Wound Laterality: Midline, Distal Cleanser: Normal Saline 1 x Per Day/30 Days Discharge Instructions: Wash your hands with soap and water. Remove old dressing, discard into plastic bag and place into trash. Cleanse the wound with Normal Saline prior to applying a clean dressing using gauze sponges, not tissues or cotton balls. Do not scrub or use excessive force. Pat dry using  gauze sponges, not tissue or cotton balls. Primary Dressing: Hydrofera Blue Classic Foam Rope Dressing, 9x6 (mm/in) 1 x Per Day/30 Days Discharge Instructions: cut rope in half lengthwise Secondary Dressing: Zetuvit Plus Silicone Non-bordered 5x5 (in/in) 1 x Per Day/30 Days 1. At this time I did discuss with the patient that my biggest concern is simply that we go ahead and continue with the [facility] Blue packing which I am pleased with I am hopeful that we will be able to get this thing close this way based on what I am seeing from last week to this week. 2. Also did cut back on the amount of packing to be used would cut it down to size and again as things hopefully continue to improve we may need even less of this packing which is great news I think the reason she was hurting is we had a little bit too much packing in currently. We will see patient back for reevaluation in 1 week here in the clinic. If anything worsens or changes patient will contact our office for additional recommendations. Electronic Signature(s) Signed: 12/11/2021 5:06:08 PM By: [name] PA-C Entered By: [name] on 12/11/2021  17:06:08 Cinelli, Rebeca C. ([id number]) -------------------------------------------------------------------------------- SuperBill Details Patient Name: Harting, Daliyah C. Date of Service: 12/11/2021 Medical Record Number: [id number] Patient Account Number: [id number] Date of Birth/Sex: 11/29/1961 (60 y.o. 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 G35 Multiple sclerosis I10 Essential (primary) hypertension Facility Procedures CPT4  Code: 39767341 Description: 99213 - WOUND CARE VISIT-LEV 3 EST PT Modifier: Quantity: 1 Physician Procedures CPT4 Code: 9379024 Description: 99214 - WC PHYS LEVEL 4 - EST PT Modifier: Quantity: 1 CPT4 Code: Description: ICD-10 Diagnosis Description T81.31XA Disruption of external operation (surgical) wound, not elsewhere classifi 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

## 2021-12-12 NOTE — Progress Notes (Signed)
TRUDIE, CERVANTES ([id number]) Visit Report for 12/11/2021 Arrival Information Details Patient Name: Nygard, GARNETT REKOWSKI. Date of Service: 12/11/2021 12:30 PM Medical Record Number: [id number] Patient Account Number: [id number] Date of Birth/Sex: [birth year] (60 y.o. F) Treating RN: Yevonne Pax Primary Care Alen Matheson: Card, Jonny Ruiz Other Clinician: Referring Kortnie Stovall: Card, John Treating Chanon Loney/Extender: Rowan Blase in Treatment: 8 Visit Information History Since Last Visit All ordered tests and consults were completed: No Patient Arrived: [name] Added or deleted any medications: No Arrival Time: 12:43 Any new allergies or adverse reactions: No Accompanied By: self Had a fall or experienced change in No Transfer Assistance: None activities of daily living that may affect Patient Identification Verified: Yes risk of falls: Secondary Verification Process Completed: Yes Signs or symptoms of abuse/neglect since last visito No Patient Requires Transmission-Based Precautions: No Hospitalized since last visit: No Patient Has Alerts: No Implantable device outside of the clinic excluding No cellular tissue based products placed in the center since last visit: Has Dressing in Place as Prescribed: Yes Pain Present Now: No Electronic Signature(s) Signed: 12/12/2021 4:05:54 PM By: Yevonne Pax RN Entered By: Yevonne Pax on 12/11/2021 12:43:33 Peckman, Tatem CMarland Kitchen ([id number]) -------------------------------------------------------------------------------- Clinic Level of Care Assessment Details Patient Name: Depaul, Illa C. Date of Service: 12/11/2021 12:30 PM Medical Record Number: [id number] Patient Account Number: [id number] Date of Birth/Sex: September 02, 1961 (60 y.o. F) Treating RN: Yevonne Pax Primary Care Redell Bhandari: Card, Jonny Ruiz Other Clinician: Referring Thuy Atilano: Card, John Treating Eliberto Sole/Extender: Rowan Blase in Treatment: 8 Clinic Level of Care Assessment  Items TOOL 4 Quantity Score X - Use when only an EandM is performed on FOLLOW-UP visit 1 0 ASSESSMENTS - Nursing Assessment / Reassessment X - Reassessment of Co-morbidities (includes updates in patient status) 1 10 X- 1 5 Reassessment of Adherence to Treatment Plan ASSESSMENTS - Wound and Skin Assessment / Reassessment X - Simple Wound Assessment / Reassessment - one wound 1 5 []  - 0 Complex Wound Assessment / Reassessment - multiple wounds []  - 0 Dermatologic / Skin Assessment (not related to wound area) ASSESSMENTS - Focused Assessment []  - Circumferential Edema Measurements - multi extremities 0 []  - 0 Nutritional Assessment / Counseling / Intervention []  - 0 Lower Extremity Assessment (monofilament, tuning fork, pulses) []  - 0 Peripheral Arterial Disease Assessment (using hand held doppler) ASSESSMENTS - Ostomy and/or Continence Assessment and Care []  - Incontinence Assessment and Management 0 []  - 0 Ostomy Care Assessment and Management (repouching, etc.) PROCESS - Coordination of Care X - Simple Patient / Family Education for ongoing care 1 15 []  - 0 Complex (extensive) Patient / Family Education for ongoing care []  - 0 Staff obtains , Records, Test Results / Process Orders []  - 0 Staff telephones HHA, Nursing Homes / Clarify orders / etc []  - 0 Routine Transfer to another Facility (non-emergent condition) []  - 0 Routine Hospital Admission (non-emergent condition) []  - 0 New Admissions / / Ordering NPWT, Apligraf, etc. []  - 0 Emergency Hospital Admission (emergent condition) X- 1 10 Simple Discharge Coordination []  - 0 Complex (extensive) Discharge Coordination PROCESS - Special Needs []  - Pediatric / Minor Patient Management 0 []  - 0 Isolation Patient Management []  - 0 Hearing / Language / Visual special needs []  - 0 Assessment of Community assistance (transportation, D/C planning, etc.) []  - 0 Additional assistance /  Altered mentation []  - 0 Support Surface(s) Assessment (bed, cushion, seat, etc.) INTERVENTIONS - Wound Cleansing / Measurement Dubeau, Cataleya C. ( ) X- 1 5 Simple Wound Cleansing -  one wound []  - 0 Complex Wound Cleansing - multiple wounds X- 1 5 Wound Imaging (photographs - any number of wounds) []  - 0 Wound Tracing (instead of photographs) X- 1 5 Simple Wound Measurement - one wound []  - 0 Complex Wound Measurement - multiple wounds INTERVENTIONS - Wound Dressings []  - Small Wound Dressing one or multiple wounds 0 X- 1 15 Medium Wound Dressing one or multiple wounds []  - 0 Large Wound Dressing one or multiple wounds []  - 0 Application of Medications - topical []  - 0 Application of Medications - injection INTERVENTIONS - Miscellaneous []  - External ear exam 0 []  - 0 Specimen Collection (cultures, biopsies, blood, body fluids, etc.) []  - 0 Specimen(s) / Culture(s) sent or taken to Lab for analysis []  - 0 Patient Transfer (multiple staff / / Similar devices) []  - 0 Simple Staple / Suture removal (25 or less) []  - 0 Complex Staple / Suture removal (26 or more) []  - 0 Hypo / Hyperglycemic Management (close monitor of Blood Glucose) []  - 0 Ankle / Brachial Index (ABI) - do not check if billed separately X- 1 5 Vital Signs Has the patient been seen at the hospital within the last three years: Yes Total Score: 80 Level Of Care: New/Established - Level 3 Electronic Signature(s) Signed: 12/12/2021 4:05:54 PM By: RN Entered By: on 12/11/2021 13:12:15 Saavedra, Taylan C ( ) -------------------------------------------------------------------------------- Encounter Discharge Information Details Patient Name: Corriveau, Arihana C. Date of Service: 12/11/2021 12:30 PM Medical Record Number: Patient Account Number: Date of Birth/Sex: 1961/05/11 (60 y.o. F) Treating RN: Primary Care  Belkis Norbeck: Other Clinician: Referring Dennison Mcdaid: Card, John Treating Jahmani Staup/Extender: in Treatment: 8 Encounter Discharge Information Items Discharge Condition: Stable Ambulatory Status: Walker Discharge Destination: Home Transportation: Private Auto Accompanied By: self Schedule Follow-up Appointment: Yes Clinical Summary of Care: Patient Declined Electronic Signature(s) Signed: 12/12/2021 4:05:54 PM By: 12/14/2021 RN Entered By: Yevonne Pax on 12/11/2021 13:13:20 Kingsford, Kinsleigh C. (12/13/2021) -------------------------------------------------------------------------------- Lower Extremity Assessment Details Patient Name: Hurrell, Amanat C. Date of Service: 12/11/2021 12:30 PM Medical Record Number: [id number] Patient Account Number: 12/13/2021 Date of Birth/Sex: 04-08-1961 (60 y.o. F) Treating RN: 09/11/1961 Primary Care Lilianne Delair: 68 Other Clinician: Referring Miamarie Moll: Card, John Treating Madalee Altmann/Extender: Yevonne Pax in Treatment: 8 Electronic Signature(s) Signed: 12/12/2021 4:05:54 PM By: Rowan Blase RN Entered By: 12/14/2021 on 12/11/2021 12:47:37 Bedore, Lashundra CYevonne Pax (12/13/2021) -------------------------------------------------------------------------------- Multi Wound Chart Details Patient Name: Schwanz, Modean C. Date of Service: 12/11/2021 12:30 PM Medical Record Number: 12/13/2021 Patient Account Number: [id number] Date of Birth/Sex: 1961-11-11 (60 y.o. F) Treating RN: 68 Primary Care Daphane Odekirk: Yevonne Pax Other Clinician: Referring Chandni Gagan: Card, John Treating Abdulah Iqbal/Extender: [name] in Treatment: 8 Vital Signs Height(in): Pulse(bpm): 92 Weight(lbs): 275 Blood Pressure(mmHg): 172/92 Body Mass Index(BMI): Temperature(F): 98.1 Respiratory Rate(breaths/min): 18 Photos: [N/A:N/A] Wound Location: Distal, Midline Back N/A N/A Wounding Event: Surgical Injury N/A N/A Primary Etiology:  Dehisced Wound N/A N/A Comorbid History: Hypertension N/A N/A Date Acquired: 04/11/2021 N/A N/A Weeks of Treatment: 8 N/A N/A Wound Status: Open N/A N/A Measurements L x W x D (cm) 0.5x0.4x3 N/A N/A Area (cm) : 0.157 N/A N/A Volume (cm) : 0.471 N/A N/A % Reduction in Area: -24.60% N/A N/A % Reduction in Volume: 18.50% N/A N/A Position 1 (o'clock): 12 Maximum Distance 1 (cm): 3.7 Position 2 (o'clock): 6 Maximum Distance 2 (cm): 1.3 Tunneling: Yes N/A N/A Classification: Full Thickness Without  Exposed N/A N/A Support Structures Exudate Amount: Large N/A N/A Exudate Type: Serosanguineous N/A N/A Exudate Color: red, brown N/A N/A Granulation Amount: Large (67-100%) N/A N/A Granulation Quality: Red N/A N/A Necrotic Amount: None Present (0%) N/A N/A Exposed Structures: Fat Layer (Subcutaneous Tissue): N/A N/A Yes Fascia: No Tendon: No Muscle: No Joint: No Bone: No Epithelialization: None N/A N/A Treatment Notes Electronic Signature(s) Signed: 12/12/2021 4:05:54 PM By: Yevonne Pax RN Entered By: Yevonne Pax on 12/11/2021 13:11:20 Pettinger, Domingo Pulse ([id number]) Mcgaughey, Courtland Salena Saner ([id number]) -------------------------------------------------------------------------------- Multi-Disciplinary Care Plan Details Patient Name: Haugen, Ludmila C. Date of Service: 12/11/2021 12:30 PM Medical Record Number: [id number] Patient Account Number: [id number] Date of Birth/Sex: June 08, 1961 (60 y.o. F) Treating RN: Yevonne Pax Primary Care Kelley Knoth: [name] Other Clinician: Referring Kenneith Stief: Card, John Treating Navarre Diana/Extender: Rowan Blase in Treatment: 8 Active Inactive Wound/Skin Impairment Nursing Diagnoses: Knowledge deficit related to ulceration/compromised skin integrity Goals: Patient/caregiver will verbalize understanding of skin care regimen Date Initiated: 10/15/2021 Target Resolution Date: 12/15/2021 Goal Status: Active Ulcer/skin breakdown will have a  volume reduction of 30% by week 4 Date Initiated: 10/15/2021 Target Resolution Date: 12/15/2021 Goal Status: Active Ulcer/skin breakdown will have a volume reduction of 50% by week 8 Date Initiated: 10/15/2021 Target Resolution Date: 01/15/2022 Goal Status: Active Ulcer/skin breakdown will have a volume reduction of 80% by week 12 Date Initiated: 10/15/2021 Target Resolution Date: 02/15/2022 Goal Status: Active Ulcer/skin breakdown will heal within 14 weeks Date Initiated: 10/15/2021 Target Resolution Date: 03/15/2022 Goal Status: Active Interventions: Assess patient/caregiver ability to obtain necessary supplies Assess patient/caregiver ability to perform ulcer/skin care regimen upon admission and as needed Assess ulceration(s) every visit Notes: Electronic Signature(s) Signed: 12/12/2021 4:05:54 PM By: Yevonne Pax RN Entered By: Yevonne Pax on 12/11/2021 13:11:05 Huneke, Reann C. ([id number]) -------------------------------------------------------------------------------- Pain Assessment Details Patient Name: Bufkin, Latandra C. Date of Service: 12/11/2021 12:30 PM Medical Record Number: [id number] Patient Account Number: [id number] Date of Birth/Sex: 14-Apr-1961 (60 y.o. F) Treating RN: Yevonne Pax Primary Care Yanil Dawe: [name] Other Clinician: Referring Kylyn Sookram: Card, John Treating Darsh Vandevoort/Extender: Rowan Blase in Treatment: 8 Active Problems Location of Pain Severity and Description of Pain Patient Has Paino No Site Locations Pain Management and Medication Current Pain Management: Electronic Signature(s) Signed: 12/12/2021 4:05:54 PM By: Yevonne Pax RN Entered By: Yevonne Pax on 12/11/2021 12:43:59 Costales, Rubi Salena Saner ([id number]) -------------------------------------------------------------------------------- Patient/Caregiver Education Details Patient Name: Dinse, Gwendalynn C. Date of Service: 12/11/2021 12:30 PM Medical Record Number:  [id number] Patient Account Number: [id number] Date of Birth/Gender: 12-09-1961 (60 y.o. F) Treating RN: Yevonne Pax Primary Care Physician: [name] Other Clinician: Referring Physician: Card, John Treating Physician/Extender: Rowan Blase in Treatment: 8 Education Assessment Education Provided To: Patient Education Topics Provided Wound/Skin Impairment: Methods: Explain/Verbal Responses: State content correctly Electronic Signature(s) Signed: 12/12/2021 4:05:54 PM By: Yevonne Pax RN Entered By: Yevonne Pax on 12/11/2021 13:12:35 Marinaccio, Azani C. ([id number]) -------------------------------------------------------------------------------- Wound Assessment Details Patient Name: Macrae, Tinisha C. Date of Service: 12/11/2021 12:30 PM Medical Record Number: [id number] Patient Account Number: [id number] Date of Birth/Sex: Aug 09, 1961 (60 y.o. F) Treating RN: Yevonne Pax Primary Care Merinda Victorino: [name] Other Clinician: Referring Steve Youngberg: Card, John Treating Brennin Durfee/Extender: Rowan Blase in Treatment: 8 Wound Status Wound Number: 1 Primary Etiology: Dehisced Wound Wound Location: Distal, Midline Back Wound Status: Open Wounding Event: Surgical Injury Comorbid History: Hypertension Date Acquired: 04/11/2021 Weeks Of Treatment: 8 Clustered Wound: No Photos Wound Measurements Length: (cm) 0.5 Width: (cm) 0.4 Depth: (cm) 3 Area: (cm) 0.157 Volume: (cm) 0.471 %  Reduction in Area: -24.6% % Reduction in Volume: 18.5% Epithelialization: None Tunneling: Yes Location 1 Position (o'clock): 12 Maximum Distance: (cm) 3.7 Location 2 Position (o'clock): 6 Maximum Distance: (cm) 1.3 Undermining: No Wound Description Classification: Full Thickness Without Exposed Support Structu Exudate Amount: Large Exudate Type: Serosanguineous Exudate Color: red, brown res Foul Odor After Cleansing: No Slough/Fibrino No Wound Bed Granulation Amount: Large (67-100%)  Exposed Structure Granulation Quality: Red Fascia Exposed: No Necrotic Amount: None Present (0%) Fat Layer (Subcutaneous Tissue) Exposed: Yes Tendon Exposed: No Muscle Exposed: No Joint Exposed: No Bone Exposed: No Treatment Notes Wound #1 (Back) Wound Laterality: Midline, Distal Guardia, Corazon C. ([id number]) Cleanser Normal Saline Discharge Instruction: Wash your hands with soap and water. Remove old dressing, discard into plastic bag and place into trash. Cleanse the wound with Normal Saline prior to applying a clean dressing using gauze sponges, not tissues or cotton balls. Do not scrub or use excessive force. Pat dry using gauze sponges, not tissue or cotton balls. Peri-Wound Care Topical Primary Dressing Hydrofera Blue Classic Foam Rope Dressing, 9x6 (mm/in) Discharge Instruction: cut rope in half lengthwise Secondary Dressing Zetuvit Plus Silicone Non-bordered 5x5 (in/in) Secured With Compression Wrap Compression Stockings Add-Ons Electronic Signature(s) Signed: 12/12/2021 4:05:54 PM By: Yevonne Pax RN Entered By: Yevonne Pax on 12/11/2021 13:07:00 Urbanik, Tereka C. ([id number]) -------------------------------------------------------------------------------- Vitals Details Patient Name: Cafaro, Meline C. Date of Service: 12/11/2021 12:30 PM Medical Record Number: [id number] Patient Account Number: [id number] Date of Birth/Sex: 28-Jul-1961 (60 y.o. F) Treating RN: Yevonne Pax Primary Care Sahiti Joswick: [name] Other Clinician: Referring Jerry Clyne: Card, John Treating Ilya Neely/Extender: Rowan Blase in Treatment: 8 Vital Signs Time Taken: 12:43 Temperature (F): 98.1 Weight (lbs): 275 Pulse (bpm): 92 Respiratory Rate (breaths/min): 18 Blood Pressure (mmHg): 172/92 Reference Range: 80 - 120 mg / dl Electronic Signature(s) Signed: 12/12/2021 4:05:54 PM By: Yevonne Pax RN Entered By: Yevonne Pax on 12/11/2021 12:43:53

## 2021-12-13 ENCOUNTER — Other Ambulatory Visit: Payer: Self-pay

## 2021-12-13 DIAGNOSIS — T8131XA Disruption of external operation (surgical) wound, not elsewhere classified, initial encounter: Secondary | ICD-10-CM | POA: Diagnosis not present

## 2021-12-13 NOTE — Progress Notes (Signed)
VICKIE, MELNIK ([id number]) Visit Report for 12/13/2021 Arrival Information Details Patient Name: [name], [name] Date of Service: 12/13/2021 3:30 PM Medical Record Number: [id number] Patient Account Number: [id number] Date of Birth/Sex: 1961-05-18 (60 y.o. F) Treating RN: Hansel Feinstein Primary Care Sharin Altidor: [name] Other Clinician: Referring Taraya Steward: Card, John Treating Dontrez Pettis/Extender: Rowan Blase in Treatment: 8 Visit Information History Since Last Visit Added or deleted any medications: No Patient Arrived: [name] Had a fall or experienced change in No Arrival Time: 16:10 activities of daily living that may affect Accompanied By: self risk of falls: Transfer Assistance: None Hospitalized since last visit: No Patient Identification Verified: Yes Has Dressing in Place as Prescribed: Yes Secondary Verification Process Completed: Yes Pain Present Now: Yes Patient Requires Transmission-Based Precautions: No Patient Has Alerts: No Electronic Signature(s) Signed: 12/13/2021 4:42:55 PM By: Hansel Feinstein Entered By: Hansel Feinstein on 12/13/2021 16:11:10 [name], [name] ([id number]) -------------------------------------------------------------------------------- Clinic Level of Care Assessment Details Patient Name: [name], [name] Date of Service: 12/13/2021 3:30 PM Medical Record Number: [id number] Patient Account Number: [id number] Date of Birth/Sex: Sep 18, 1961 (60 y.o. F) Treating RN: Hansel Feinstein Primary Care Britten Seyfried: CardJonny Ruiz Other Clinician: Referring Jami Ohlin: Card, John Treating Jonel Sick/Extender: Rowan Blase in Treatment: 8 Clinic Level of Care Assessment Items TOOL 4 Quantity Score []  - Use when only an EandM is performed on FOLLOW-UP visit 0 ASSESSMENTS - Nursing Assessment / Reassessment []  - Reassessment of Co-morbidities (includes updates in patient status) 0 []  - 0 Reassessment of Adherence to Treatment Plan ASSESSMENTS - Wound and  Skin Assessment / Reassessment X - Simple Wound Assessment / Reassessment - one wound 1 5 []  - 0 Complex Wound Assessment / Reassessment - multiple wounds []  - 0 Dermatologic / Skin Assessment (not related to wound area) ASSESSMENTS - Focused Assessment []  - Circumferential Edema Measurements - multi extremities 0 []  - 0 Nutritional Assessment / Counseling / Intervention []  - 0 Lower Extremity Assessment (monofilament, tuning fork, pulses) []  - 0 Peripheral Arterial Disease Assessment (using hand held doppler) ASSESSMENTS - Ostomy and/or Continence Assessment and Care []  - Incontinence Assessment and Management 0 []  - 0 Ostomy Care Assessment and Management (repouching, etc.) PROCESS - Coordination of Care X - Simple Patient / Family Education for ongoing care 1 15 []  - 0 Complex (extensive) Patient / Family Education for ongoing care []  - 0 Staff obtains , Records, Test Results / Process Orders []  - 0 Staff telephones HHA, Nursing Homes / Clarify orders / etc []  - 0 Routine Transfer to another Facility (non-emergent condition) []  - 0 Routine Hospital Admission (non-emergent condition) []  - 0 New Admissions / / Ordering NPWT, Apligraf, etc. []  - 0 Emergency Hospital Admission (emergent condition) X- 1 10 Simple Discharge Coordination []  - 0 Complex (extensive) Discharge Coordination PROCESS - Special Needs []  - Pediatric / Minor Patient Management 0 []  - 0 Isolation Patient Management []  - 0 Hearing / Language / Visual special needs []  - 0 Assessment of Community assistance (transportation, D/C planning, etc.) []  - 0 Additional assistance / Altered mentation []  - 0 Support Surface(s) Assessment (bed, cushion, seat, etc.) INTERVENTIONS - Wound Cleansing / Measurement [name], [name] ( ) X- 1 5 Simple Wound Cleansing - one wound []  - 0 Complex Wound Cleansing - multiple wounds []  - 0 Wound Imaging (photographs - any  number of wounds) []  - 0 Wound Tracing (instead of photographs) []  - 0 Simple Wound Measurement - one wound []  - 0 Complex Wound Measurement -  multiple wounds INTERVENTIONS - Wound Dressings X - Small Wound Dressing one or multiple wounds 1 10 []  - 0 Medium Wound Dressing one or multiple wounds []  - 0 Large Wound Dressing one or multiple wounds []  - 0 Application of Medications - topical []  - 0 Application of Medications - injection INTERVENTIONS - Miscellaneous []  - External ear exam 0 []  - 0 Specimen Collection (cultures, biopsies, blood, body fluids, etc.) []  - 0 Specimen(s) / Culture(s) sent or taken to Lab for analysis []  - 0 Patient Transfer (multiple staff / / Similar devices) []  - 0 Simple Staple / Suture removal (25 or less) []  - 0 Complex Staple / Suture removal (26 or more) []  - 0 Hypo / Hyperglycemic Management (close monitor of Blood Glucose) []  - 0 Ankle / Brachial Index (ABI) - do not check if billed separately []  - 0 Vital Signs Has the patient been seen at the hospital within the last three years: Yes Total Score: 45 Level Of Care: New/Established - Level 2 Electronic Signature(s) Signed: 12/13/2021 4:42:55 PM By: Entered By: on 12/13/2021 16:19:11 [name], [name] ( ) -------------------------------------------------------------------------------- Encounter Discharge Information Details Patient Name: [name], [name] Date of Service: 12/13/2021 3:30 PM Medical Record Number: Nurse, adult Patient Account Number: Date of Birth/Sex: Jan 03, 1961 (60 y.o. F) Treating RN: Primary Care Khalee Mazo: Other Clinician: Referring Leyland Kenna: Card, John Treating Angelize Ryce/Extender: 12/15/2021 in Treatment: 8 Encounter Discharge Information Items Discharge Condition: Stable Ambulatory Status: Walker Discharge Destination: Home Transportation: Private Auto Accompanied By:  self Schedule Follow-up Appointment: Yes Clinical Summary of Care: Electronic Signature(s) Signed: 12/13/2021 4:42:55 PM By: Hansel Feinstein Entered By: 12/15/2021 on 12/13/2021 16:18:53 [name], [name] ([id number]) -------------------------------------------------------------------------------- Wound Assessment Details Patient Name: [name], [name] Date of Service: 12/13/2021 3:30 PM Medical Record Number: [id number] Patient Account Number: [id number] Date of Birth/Sex: Mar 30, 1961 (60 y.o. F) Treating RN: Hansel Feinstein Primary Care Caleb Prigmore: CardChristena Flake Other Clinician: Referring Tashiya Souders: Card, John Treating Tamsyn Owusu/Extender: Rowan Blase in Treatment: 8 Wound Status Wound Number: 1 Primary Etiology: Dehisced Wound Wound Location: Distal, Midline Back Wound Status: Open Wounding Event: Surgical Injury Comorbid History: Hypertension Date Acquired: 04/11/2021 Weeks Of Treatment: 8 Clustered Wound: No Wound Measurements Length: (cm) 0.5 Width: (cm) 0.4 Depth: (cm) 3 Area: (cm) 0.157 Volume: (cm) 0.471 % Reduction in Area: -24.6% % Reduction in Volume: 18.5% Epithelialization: None Wound Description Classification: Full Thickness Without Exposed Support Structu Exudate Amount: Large Exudate Type: Serosanguineous Exudate Color: red, brown res Foul Odor After Cleansing: No Slough/Fibrino No Wound Bed Granulation Amount: Large (67-100%) Exposed Structure Granulation Quality: Red Fascia Exposed: No Necrotic Amount: None Present (0%) Fat Layer (Subcutaneous Tissue) Exposed: Yes Tendon Exposed: No Muscle Exposed: No Joint Exposed: No Bone Exposed: No Treatment Notes Wound #1 (Back) Wound Laterality: Midline, Distal Cleanser Normal Saline Discharge Instruction: Wash your hands with soap and water. Remove old dressing, discard into plastic bag and place into trash. Cleanse the wound with Normal Saline prior to applying a clean dressing using gauze sponges, not  tissues or cotton balls. Do not scrub or use excessive force. Pat dry using gauze sponges, not tissue or cotton balls. Peri-Wound Care Topical Primary Dressing Hydrofera Blue Classic Foam Rope Dressing, 9x6 (mm/in) Discharge Instruction: cut rope in half lengthwise Secondary Dressing Zetuvit Plus Silicone Non-bordered 5x5 (in/in) Secured With Compression Wrap Compression Stockings [name], [name] (Hansel Feinstein) Add-Ons Electronic Signature(s) Signed: 12/13/2021 4:42:55 PM By: [id number] Entered By12/17/2022,  Joy on 12/13/2021 16:12:13

## 2021-12-14 NOTE — Progress Notes (Signed)
AELLA, RONDA ([id number]) Visit Report for 12/13/2021 Physician Orders Details Patient Name: [name], [name] Date of Service: 12/13/2021 3:30 PM Medical Record Number: [id number] Patient Account Number: [id number] Date of Birth/Sex: [birth year] (60 y.o. F) Treating RN: Hansel Feinstein Primary Care Provider: [name] Other Clinician: Referring Provider: Card, John Treating Provider/Extender: Rowan Blase in Treatment: 8 Verbal / Phone Orders: No Diagnosis Coding Follow-up Appointments o Return Appointment in 1 week. o Nurse Visit as needed - nurse visit on thursday Home Health o [facility] for wound care. May utilize formulary equivalent dressing for wound treatment orders unless otherwise specified. Home Health Nurse may visit PRN to address patientos wound care needs. - 3 times per week [facility] fax [phone] Bathing/ Shower/ Hygiene o May shower; gently cleanse wound with antibacterial soap, rinse and pat dry prior to dressing wounds Edema Control - Lymphedema / Segmental Compressive Device / Other o Elevate, Exercise Daily and Avoid Standing for Long Periods of Time. o Elevate legs to the level of the heart and pump ankles as often as possible o Elevate leg(s) parallel to the floor when sitting. Additional Orders / Instructions o Follow Nutritious Diet and Increase Protein Intake Wound Treatment Wound #1 - Back Wound Laterality: Midline, Distal Cleanser: Normal Saline 1 x Per Day/30 Days Discharge Instructions: Wash your hands with soap and water. Remove old dressing, discard into plastic bag and place into trash. Cleanse the wound with Normal Saline prior to applying a clean dressing using gauze sponges, not tissues or cotton balls. Do not scrub or use excessive force. Pat dry using gauze sponges, not tissue or cotton balls. Primary Dressing: Hydrofera Blue Classic Foam Rope Dressing, 9x6 (mm/in) 1 x Per Day/30 Days Discharge Instructions:  cut rope in half lengthwise Secondary Dressing: Zetuvit Plus Silicone Non-bordered 5x5 (in/in) 1 x Per Day/30 Days Electronic Signature(s) Signed: 12/13/2021 4:42:55 PM By: Hansel Feinstein Signed: 12/13/2021 7:26:01 PM By: [name] PA-C Entered By: Hansel Feinstein on 12/13/2021 16:12:52 [name], [name] ([id number]) -------------------------------------------------------------------------------- SuperBill Details Patient Name: [name], [name] Date of Service: 12/13/2021 Medical Record Number: [id number] Patient Account Number: [id number] Date of Birth/Sex: [birth year] (60 y.o. 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

## 2021-12-18 ENCOUNTER — Other Ambulatory Visit: Payer: Self-pay

## 2021-12-18 ENCOUNTER — Encounter: Payer: 59 | Admitting: Physician Assistant

## 2021-12-18 DIAGNOSIS — T8131XA Disruption of external operation (surgical) wound, not elsewhere classified, initial encounter: Secondary | ICD-10-CM | POA: Diagnosis not present

## 2021-12-18 NOTE — Progress Notes (Addendum)
ARALI, SOMERA ([id number]) Visit Report for 12/18/2021 Arrival Information Details Patient Name: [name] Date of Service: 12/18/2021 12:30 PM Medical Record Number: [id number] Patient Account Number: [id number] Date of Birth/Sex: [birth year] (60 y.o. F) Treating RN: Yevonne Pax Primary Care Andyn Sales: Card, Jonny Ruiz Other Clinician: Referring Leland Staszewski: Card, John Treating Mckay Tegtmeyer/Extender: Rowan Blase in Treatment: 9 Visit Information History Since Last Visit All ordered tests and consults were completed: No Patient Arrived: [name] Added or deleted any medications: No Arrival Time: 12:48 Any new allergies or adverse reactions: No Accompanied By: self Had a fall or experienced change in No Transfer Assistance: None activities of daily living that may affect Patient Identification Verified: Yes risk of falls: Secondary Verification Process Completed: Yes Signs or symptoms of abuse/neglect since last visito No Patient Requires Transmission-Based Precautions: No Hospitalized since last visit: No Patient Has Alerts: No Implantable device outside of the clinic excluding No cellular tissue based products placed in the center since last visit: Has Dressing in Place as Prescribed: Yes Pain Present Now: No Electronic Signature(s) Signed: 12/19/2021 10:49:38 AM By: Yevonne Pax RN Entered By: Yevonne Pax on 12/18/2021 12:51:01 [name], [name] ([id number]) -------------------------------------------------------------------------------- Clinic Level of Care Assessment Details Patient Name: [name], [name] Date of Service: 12/18/2021 12:30 PM Medical Record Number: [id number] Patient Account Number: [id number] Date of Birth/Sex: 03-Jul-1961 (60 y.o. F) Treating RN: Huel Coventry Primary Care Marykathleen Russi: Card, Jonny Ruiz Other Clinician: Referring Jiovanna Frei: Card, John Treating Heath Badon/Extender: Rowan Blase in Treatment: 9 Clinic Level of Care Assessment Items TOOL  4 Quantity Score []  - Use when only an EandM is performed on FOLLOW-UP visit 0 ASSESSMENTS - Nursing Assessment / Reassessment X - Reassessment of Co-morbidities (includes updates in patient status) 1 10 X- 1 5 Reassessment of Adherence to Treatment Plan ASSESSMENTS - Wound and Skin Assessment / Reassessment X - Simple Wound Assessment / Reassessment - one wound 1 5 []  - 0 Complex Wound Assessment / Reassessment - multiple wounds []  - 0 Dermatologic / Skin Assessment (not related to wound area) ASSESSMENTS - Focused Assessment []  - Circumferential Edema Measurements - multi extremities 0 []  - 0 Nutritional Assessment / Counseling / Intervention []  - 0 Lower Extremity Assessment (monofilament, tuning fork, pulses) []  - 0 Peripheral Arterial Disease Assessment (using hand held doppler) ASSESSMENTS - Ostomy and/or Continence Assessment and Care []  - Incontinence Assessment and Management 0 []  - 0 Ostomy Care Assessment and Management (repouching, etc.) PROCESS - Coordination of Care X - Simple Patient / Family Education for ongoing care 1 15 []  - 0 Complex (extensive) Patient / Family Education for ongoing care X- 1 10 Staff obtains , Records, Test Results / Process Orders []  - 0 Staff telephones HHA, Nursing Homes / Clarify orders / etc []  - 0 Routine Transfer to another Facility (non-emergent condition) []  - 0 Routine Hospital Admission (non-emergent condition) []  - 0 New Admissions / / Ordering NPWT, Apligraf, etc. []  - 0 Emergency Hospital Admission (emergent condition) X- 1 10 Simple Discharge Coordination []  - 0 Complex (extensive) Discharge Coordination PROCESS - Special Needs []  - Pediatric / Minor Patient Management 0 []  - 0 Isolation Patient Management []  - 0 Hearing / Language / Visual special needs []  - 0 Assessment of Community assistance (transportation, D/C planning, etc.) []  - 0 Additional assistance / Altered  mentation []  - 0 Support Surface(s) Assessment (bed, cushion, seat, etc.) INTERVENTIONS - Wound Cleansing / Measurement [name], [name] ( ) X- 1 5 Simple Wound Cleansing - one  wound []  - 0 Complex Wound Cleansing - multiple wounds X- 1 5 Wound Imaging (photographs - any number of wounds) []  - 0 Wound Tracing (instead of photographs) X- 1 5 Simple Wound Measurement - one wound []  - 0 Complex Wound Measurement - multiple wounds INTERVENTIONS - Wound Dressings []  - Small Wound Dressing one or multiple wounds 0 X- 1 15 Medium Wound Dressing one or multiple wounds []  - 0 Large Wound Dressing one or multiple wounds []  - 0 Application of Medications - topical []  - 0 Application of Medications - injection INTERVENTIONS - Miscellaneous []  - External ear exam 0 []  - 0 Specimen Collection (cultures, biopsies, blood, body fluids, etc.) []  - 0 Specimen(s) / Culture(s) sent or taken to Lab for analysis []  - 0 Patient Transfer (multiple staff / / Similar devices) []  - 0 Simple Staple / Suture removal (25 or less) []  - 0 Complex Staple / Suture removal (26 or more) []  - 0 Hypo / Hyperglycemic Management (close monitor of Blood Glucose) []  - 0 Ankle / Brachial Index (ABI) - do not check if billed separately X- 1 5 Vital Signs Has the patient been seen at the hospital within the last three years: Yes Total Score: 90 Level Of Care: New/Established - Level 3 Electronic Signature(s) Signed: 12/19/2021 9:42:34 AM By: , BSN, RN, CWS, Kim RN, BSN Entered By: , BSN, RN, CWS, Kim on 12/18/2021 13:08:54 [name], ( ) -------------------------------------------------------------------------------- Encounter Discharge Information Details Patient Name: [name], [name] Date of Service: 12/18/2021 12:30 PM Medical Record Number: Patient Account Number: Date of Birth/Sex: [birth year] (60 y.o. F) Treating RN: Primary Care Dyan Creelman: Other Clinician: Referring Jailin Moomaw: Card, John Treating Masiel Gentzler/Extender: in Treatment: 9 Encounter Discharge Information Items Discharge Condition: Stable Ambulatory Status: Walker Discharge Destination: Home Transportation: Private Auto Accompanied By: self Schedule Follow-up Appointment: Yes Clinical Summary of Care: Electronic Signature(s) Signed: 12/19/2021 9:42:34 AM By: 12/21/2021, BSN, RN, CWS, Kim RN, BSN Entered By: Elliot Gurney, BSN, RN, CWS, Kim on 12/18/2021 13:10:09 Dietrick, 12/20/2021 (Domingo Pulse) -------------------------------------------------------------------------------- Lower Extremity Assessment Details Patient Name: Larranaga, Danni C. Date of Service: 12/18/2021 12:30 PM Medical Record Number: 12/20/2021 Patient Account Number: [id number] Date of Birth/Sex: [birth year] (60 y.o. F) Treating RN: 68 Primary Care Stylianos Stradling: Huel Coventry Other Clinician: Referring Amor Packard: Card, John Treating Hason Ofarrell/Extender: [name] in Treatment: 9 Electronic Signature(s) Signed: 12/19/2021 9:42:34 AM By: 12/21/2021, BSN, RN, CWS, Kim RN, BSN Entered By: Elliot Gurney, BSN, RN, CWS, Kim on 12/18/2021 12:57:11 Salehi, 12/20/2021 (Domingo Pulse) -------------------------------------------------------------------------------- Multi Wound Chart Details Patient Name: Duncanson, Mellony C. Date of Service: 12/18/2021 12:30 PM Medical Record Number: 12/20/2021 Patient Account Number: [id number] Date of Birth/Sex: 1961/06/28 (60 y.o. F) Treating RN: 68 Primary Care Berwyn Bigley: Huel Coventry Other Clinician: Referring Tarance Balan: Card, John Treating Destane Speas/Extender: [name] in Treatment: 9 Vital Signs Height(in): Pulse(bpm): 75 Weight(lbs): 275 Blood Pressure(mmHg): 134/82 Body Mass Index(BMI): Temperature(F): 98 Respiratory Rate(breaths/min): 18 Photos: [1:No Photos] [N/A:N/A] Wound Location: [1:Distal, Midline  Back] [N/A:N/A] Wounding Event: [1:Surgical Injury] [N/A:N/A] Primary Etiology: [1:Dehisced Wound] [N/A:N/A] Comorbid History: [1:Hypertension] [N/A:N/A] Date Acquired: [1:04/11/2021] [N/A:N/A] Weeks of Treatment: [1:9] [N/A:N/A] Wound Status: [1:Open] [N/A:N/A] Measurements L x W x D (cm) [1:0.5x0.3x3.2] [N/A:N/A] Area (cm) : [1:0.118] [N/A:N/A] Volume (cm) : [1:0.377] [N/A:N/A] % Reduction in Area: [1:6.30%] [N/A:N/A] % Reduction in Volume: [1:34.80%] [N/A:N/A] Classification: [1:Full Thickness Without Exposed Support Structures] [N/A:N/A] Exudate Amount: [1:Large] [N/A:N/A] Exudate Type: [1:Serosanguineous] [N/A:N/A] Exudate Color: [  1:red, brown] [N/A:N/A] Granulation Amount: [1:Large (67-100%)] [N/A:N/A] Granulation Quality: [1:Red] [N/A:N/A] Necrotic Amount: [1:None Present (0%)] [N/A:N/A] Exposed Structures: [1:Fat Layer (Subcutaneous Tissue): Yes Fascia: No Tendon: No Muscle: No Joint: No Bone: No None] [N/A:N/A N/A] Treatment Notes Electronic Signature(s) Signed: 12/19/2021 9:42:34 AM By: Elliot Gurney, BSN, RN, CWS, Kim RN, BSN Entered By: Elliot Gurney, BSN, RN, CWS, Kim on 12/18/2021 12:57:33 Leeds, Domingo Pulse ([id number]) -------------------------------------------------------------------------------- Multi-Disciplinary Care Plan Details Patient Name: Bonnie, Loralie C. Date of Service: 12/18/2021 12:30 PM Medical Record Number: [id number] Patient Account Number: [id number] Date of Birth/Sex: 04/27/1961 (60 y.o. F) Treating RN: Huel Coventry Primary Care Orhan Mayorga: [name] Other Clinician: Referring Weston Kallman: Card, John Treating Json Koelzer/Extender: Rowan Blase in Treatment: 9 Active Inactive Wound/Skin Impairment Nursing Diagnoses: Knowledge deficit related to ulceration/compromised skin integrity Goals: Patient/caregiver will verbalize understanding of skin care regimen Date Initiated: 10/15/2021 Target Resolution Date: 12/15/2021 Goal Status: Active Ulcer/skin  breakdown will have a volume reduction of 30% by week 4 Date Initiated: 10/15/2021 Target Resolution Date: 12/15/2021 Goal Status: Active Ulcer/skin breakdown will have a volume reduction of 50% by week 8 Date Initiated: 10/15/2021 Target Resolution Date: 01/15/2022 Goal Status: Active Ulcer/skin breakdown will have a volume reduction of 80% by week 12 Date Initiated: 10/15/2021 Target Resolution Date: 02/15/2022 Goal Status: Active Ulcer/skin breakdown will heal within 14 weeks Date Initiated: 10/15/2021 Target Resolution Date: 03/15/2022 Goal Status: Active Interventions: Assess patient/caregiver ability to obtain necessary supplies Assess patient/caregiver ability to perform ulcer/skin care regimen upon admission and as needed Assess ulceration(s) every visit Notes: Electronic Signature(s) Signed: 12/19/2021 9:42:34 AM By: Elliot Gurney, BSN, RN, CWS, Kim RN, BSN Entered By: Elliot Gurney, BSN, RN, CWS, Kim on 12/18/2021 12:57:22 Babson, Domingo Pulse ([id number]) -------------------------------------------------------------------------------- Pain Assessment Details Patient Name: Slemmer, Sincere C. Date of Service: 12/18/2021 12:30 PM Medical Record Number: [id number] Patient Account Number: [id number] Date of Birth/Sex: 12-21-1961 (60 y.o. F) Treating RN: Yevonne Pax Primary Care Cassian Torelli: [name] Other Clinician: Referring Kemora Pinard: Card, John Treating Jovaughn Wojtaszek/Extender: Rowan Blase in Treatment: 9 Active Problems Location of Pain Severity and Description of Pain Patient Has Paino No Site Locations Pain Management and Medication Current Pain Management: Electronic Signature(s) Signed: 12/19/2021 10:49:38 AM By: Yevonne Pax RN Entered By: Yevonne Pax on 12/18/2021 12:51:31 Bourquin, Rashaun Salena Saner ([id number]) -------------------------------------------------------------------------------- Patient/Caregiver Education Details Patient Name: Diggs, Farida C. Date of Service:  12/18/2021 12:30 PM Medical Record Number: [id number] Patient Account Number: [id number] Date of Birth/Gender: [birth year] (60 y.o. F) Treating RN: Huel Coventry Primary Care Physician: [name] Other Clinician: Referring Physician: Card, John Treating Physician/Extender: Rowan Blase in Treatment: 9 Education Assessment Education Provided To: Patient Education Topics Provided Wound/Skin Impairment: Handouts: Caring for Your Ulcer Methods: Demonstration, Explain/Verbal Responses: State content correctly Electronic Signature(s) Signed: 12/19/2021 9:42:34 AM By: Elliot Gurney, BSN, RN, CWS, Kim RN, BSN Entered By: Elliot Gurney, BSN, RN, CWS, Kim on 12/18/2021 13:09:32 Sarsfield, Domingo Pulse ([id number]) -------------------------------------------------------------------------------- Wound Assessment Details Patient Name: Hedglin, December C. Date of Service: 12/18/2021 12:30 PM Medical Record Number: [id number] Patient Account Number: [id number] Date of Birth/Sex: [birth year] (60 y.o. F) Treating RN: Huel Coventry Primary Care Corvin Sorbo: Card, Jonny Ruiz Other Clinician: Referring Iwalani Templeton: Card, John Treating Dennison Mcdaid/Extender: Rowan Blase in Treatment: 9 Wound Status Wound Number: 1 Primary Etiology: Dehisced Wound Wound Location: Distal, Midline Back Wound Status: Open Wounding Event: Surgical Injury Comorbid History: Hypertension Date Acquired: 04/11/2021 Weeks Of Treatment: 9 Clustered Wound: No Wound Measurements Length: (cm) 0.5 Width: (cm) 0.3 Depth: (cm) 3.2 Area: (cm) 0.118 Volume: (cm) 0.377 % Reduction  in Area: 6.3% % Reduction in Volume: 34.8% Epithelialization: None Wound Description Classification: Full Thickness Without Exposed Support Structures Exudate Amount: Large Exudate Type: Serosanguineous Exudate Color: red, brown Foul Odor After Cleansing: No Slough/Fibrino No Wound Bed Granulation Amount: Large (67-100%) Exposed Structure Granulation Quality: Red Fascia  Exposed: No Necrotic Amount: None Present (0%) Fat Layer (Subcutaneous Tissue) Exposed: Yes Tendon Exposed: No Muscle Exposed: No Joint Exposed: No Bone Exposed: No Treatment Notes Wound #1 (Back) Wound Laterality: Midline, Distal Cleanser Normal Saline Discharge Instruction: Wash your hands with soap and water. Remove old dressing, discard into plastic bag and place into trash. Cleanse the wound with Normal Saline prior to applying a clean dressing using gauze sponges, not tissues or cotton balls. Do not scrub or use excessive force. Pat dry using gauze sponges, not tissue or cotton balls. Peri-Wound Care Topical Primary Dressing Hydrofera Blue Classic Foam Rope Dressing, 9x6 (mm/in) Discharge Instruction: cut rope in 1/4 lengthwise Secondary Dressing Zetuvit Plus Silicone Non-bordered 5x5 (in/in) Secured With Compression Wrap Compression Stockings TYAH, MERY ([id number]) Add-Ons Electronic Signature(s) Signed: 12/19/2021 9:42:34 AM By: Elliot Gurney, BSN, RN, CWS, Kim RN, BSN Entered By: Elliot Gurney, BSN, RN, CWS, Kim on 12/18/2021 12:56:59 Balke, Domingo Pulse ([id number]) -------------------------------------------------------------------------------- Vitals Details Patient Name: Bussiere, Raetta C. Date of Service: 12/18/2021 12:30 PM Medical Record Number: [id number] Patient Account Number: [id number] Date of Birth/Sex: 06-04-1961 (60 y.o. F) Treating RN: Yevonne Pax Primary Care Demmi Sindt: [name] Other Clinician: Referring Laiana Fratus: Card, John Treating Idris Edmundson/Extender: Rowan Blase in Treatment: 9 Vital Signs Time Taken: 12:51 Temperature (F): 98 Weight (lbs): 275 Pulse (bpm): 75 Respiratory Rate (breaths/min): 18 Blood Pressure (mmHg): 134/82 Reference Range: 80 - 120 mg / dl Electronic Signature(s) Signed: 12/19/2021 10:49:38 AM By: Yevonne Pax RN Entered By: Yevonne Pax on 12/18/2021 12:51:23

## 2021-12-18 NOTE — Progress Notes (Addendum)
[name], [name] ([id number]) Visit Report for 12/18/2021 Chief Complaint Document Details Patient Name: [name], [name] Date of Service: 12/18/2021 12:30 PM Medical Record Number: [id number] Patient Account Number: [id number] Date of Birth/Sex: 1961/03/13 (60 y.o. F) Treating RN: Huel Coventry Primary Care Provider: [name] Other Clinician: Referring Provider: Card, John Treating Provider/Extender: Rowan Blase in Treatment: 9 Information Obtained from: Patient Chief Complaint Surgical Back Ulcer Electronic Signature(s) Signed: 12/18/2021 12:50:27 PM By: [name] PA-[name] Entered By: [name] on 12/18/2021 12:50:26 [name], [name] ([id number]) -------------------------------------------------------------------------------- HPI Details Patient Name: [name], [name] Date of Service: 12/18/2021 12:30 PM Medical Record Number: [id number] Patient Account Number: [id number] Date of Birth/Sex: 1961/02/20 (60 y.o. F) Treating RN: Huel Coventry Primary Care Provider: [name] Other Clinician: Referring Provider: Card, John Treating Provider/Extender: Rowan Blase in Treatment: 9 History of Present Illness HPI Description: 10/15/2021 upon evaluation today patient presents for initial evaluation here in the clinic concerning a surgical ulceration/dehiscence in the lumbar spine region following surgery that she had over the past year. This was actually broken up into 3 separate surgical events. The initial surgical intervention actually was on November 05, 2021 almost a year ago. Subsequently the patient went back in February for a seroma of the area which unfortunately required her to have a repeat surgery to go in and clean this out. And then again this occurred in April where she went back in and again they felt like stitches were coming out and there was an additional seroma. She was placed in a wound VAC initially and then subsequently as it got smaller that  was discontinued. Again right now I will see anything that I think a wound VAC would help with. Nonetheless she definitely has a significant depth to the wound that is going require packing. I actually believe the Hydrofera Blue rope would probably do quite well with this the problem is as much as it is draining she probably needs this to be changed at least every day. She does not really have anyone that can help with that that is the complicating scenario here. With that being said the patient does have a history of multiple sclerosis, hypertension, and again this surgical wound dehiscence in regard to her lumbar spine region. She did have a repeat MRI which was actually completed 10/09/2021. This showed that there was no significant change in the subcutaneous fluid collection/track of the lower lumbar region. This is extending to the level of the fascia unfortunately. This seems to go all the way from the L2 level with a track extending all the way to the fascia at the L4-5 level. Again this is a significant wound and there is significant drainage but does not seem to communicate to the spinal region as far as spinal fluid or otherwise is concerned that is good news. Nonetheless she last saw Dr. Franky Macho who is her neurosurgeon on 10/01/2021 that was when he ordered this last MRI she supposed to see him next week as well. With that being said he did not feel like there was any significant issue there but was not sure why this was not healing that is when he ordered the MRI. They were wanting to make sure that this was packed appropriately by home health unfortunately the main issue currently is that home health is completely out of the picture as the patient has exhausted all the home health that that she gets for a year. She is now in a very difficult  predicament where she does not have anyone to help her change the dressing and to be honest that she is not able to do it herself with the location of  the wound being on the midline lumbar spine region. If she does not have anyone that can help it is probably can to be necessary for her to go to a facility for rehab and daily dressing changes as I feel like daily changes which is much drainage that she is having is going to be necessary. 10/23/2021 upon evaluation today patient appears to be doing decently well in regard to her back ulcer. This does seem to be draining a lot less than what it is been doing in the past. With that being said she still has quite a bit of drainage nonetheless. I do think that given time this should improve least I hope so. The good news is she does have home health coming out 3 days a week were doing it 2 days a week and she is paying someone we can to help. 10/30/2021 upon evaluation today patient appears to be doing okay in regard to her back ulcer this is not draining quite as bad as it was in the beginning but he still has quite a bit of drainage noted. I do believe that the patient would benefit from [place] going ahead forward with attempting a wound VAC using the Hydrofera Blue rope to pack with and then subsequently using the VAC externally to actually suction out and help this to fill- in. I think this is our ideal way to try to get things cleared at this point. As it stands I am not certain that we are really making a progress that we want to see near with doing it in the way we are which is packing with the rope. It is a good dressing but I do think it is insufficient for total healing. She just seems to have too much in the way of drainage at this point unfortunately. 11/06/2021 upon evaluation today patient unfortunately continues to have issues with her back ongoing. The good news is her MRI that was repeated showed signs of the size of this area in the lumbar spine region having decreased from 4 cm to 3.5 cm this is definitely not bad news at all. With that being said unfortunately she continues to have issues  with ongoing drainage not as severe as in the beginning but nonetheless still significant. I do think a wound VAC still would be a good way to go although her home health agency nurse apparently has some concerns about the possibility of not being able to keep a seal with this as they had struggles in the past. Nonetheless I explained to the patient that this is much different than what she had previous and that I really feel like it would do much better as far as getting the area taken care of without having any complications or issues here. I think that we should be able to maintain a seal. Nonetheless at this time I did discuss with the patient as well that she probably does need to have a wound VAC in order for [place] to get this moving in the right direction. 11/13/2021 upon evaluation patient's wound bed actually showed signs of significant drainage at this time. She did see the surgeon yesterday he did not see anything that appeared to be infected. Nonetheless he does appear that she is continuing to have areas here that just do not seem  to want to seal up there MRI findings have been negative but nonetheless she continues to have is the seroma that is filling in. I do feel like we need to try to widen the hole so we can get at least a half of the [facility] then this will be better than nothing at this point. 11/20/2021 upon evaluation today patient actually appears to be having less pain at this point which is good news and overall she we still do not have the results of the culture back yet it had to be sent out to [facility] and we do not have the result back yet. Is doing decently well in regard to her wound. Fortunately there does not appear to be any signs of active infection systemically nor locally at this time. 11/27/2021 upon evaluation today patient appears to be doing well with regard to her wound all things considered there does appear to be less drainage than there was previous.  Fortunately I do not see any evidence of worsening of the patient is stating that she is having some issues with back pain. This is somewhat new. Again this I think could be related to the fact that she is having some issues here with infection. We are still waiting to see what the result of her culture shows from susceptibility testing Enterococcus has been identified but we do not know if this is VRE or not. 12/04/2021 upon evaluation today patient appears to be doing well with regard to her wound all things considered. I did have a conversation with Erin from Dr. Lacy Duverney office. Of note she notes that Dr. Joetta Manners really does not want to do anything surgical right now which I completely understand. With that being said I am still leery of how far we will make it getting this to heal short of any type of surgery to open this up and allow [place] to more appropriately packed the wound. Nonetheless I will absolutely give it our best shot as far as that is concerned. I discussed that with the patient today. She voiced understanding. The good news is the drainage today seems to be clear it is no longer cloudy as it was previous I am actually very pleased in that regard. [name], [name] ([id number]) 12/11/2021 since have last seen the patient actually did have an conversation with Dr. Christella Noa with her neurosurgeon. Again this involve the discussion around whether or not to open the wound and try to apply a wound VAC following. With that being said the decision was made that that may be the best thing to do if the patient was in agreement. Nonetheless I am actually extremely encouraged with what I am seeing today much more than I would have thought. In fact I think that we may be over packing the wound which is why she is having some discomfort at this point as there is much less of the dressing able to get and this time compared to what we saw previous. That is actually really good news. In fact the 6:00  tunnel appears to have filled in is awesome news. At the 12:00 location I am actually able to pack into that area pretty effectively at this point today. In fact I was able to get less of the packing in which I will detail below. 12/18/2021 upon evaluation today patient appears to be doing well with regard to her wound on the back. Fortunately there is no signs of active infection which is great news and overall  very pleased with where things stand today. No fevers, chills, nausea, vomiting, or diarrhea. Electronic Signature(s) Signed: 12/18/2021 1:36:42 PM By: [name] PA-[name] Entered By: [name] on 12/18/2021 13:36:41 [name], [name] ([id number]) -------------------------------------------------------------------------------- Physical Exam Details Patient Name: [name], [name] Date of Service: 12/18/2021 12:30 PM Medical Record Number: [id number] Patient Account Number: [id number] Date of Birth/Sex: 05-29-1961 (60 y.o. F) Treating RN: Huel Coventry Primary Care Provider: [name] Other Clinician: Referring Provider: Card, John Treating Provider/Extender: Rowan Blase in Treatment: 9 Constitutional Well-nourished and well-hydrated in no acute distress. Respiratory normal breathing without difficulty. Psychiatric this patient is able to make decisions and demonstrates good insight into disease process. Alert and Oriented x 3. pleasant and cooperative. Notes Upon inspection patient's wound bed showed signs of good granulation epithelization at this point. Fortunately I see no signs of active infection locally nor systemically. I do think that she mainly has a tunnel at 12:00 currently the 6:00 is completely closed I am pleased in that regard. Were also going to cut back on the size of the [facility] currently. Electronic Signature(s) Signed: 12/18/2021 1:37:04 PM By: [name] PA-[name] Entered By: [name] on 12/18/2021 13:37:04 [name], [name]  ([id number]) -------------------------------------------------------------------------------- Physician Orders Details Patient Name: [name], [name] Date of Service: 12/18/2021 12:30 PM Medical Record Number: [id number] Patient Account Number: [id number] Date of Birth/Sex: Feb 11, 1961 (60 y.o. 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 o Nurse Visit as needed - nurse visit on thursday Home Health o [facility] for wound care. May utilize formulary equivalent dressing for wound treatment orders unless otherwise specified. Home Health Nurse may visit PRN to address patientos wound care needs. - 3 times per week [facility] fax [phone] Bathing/ Shower/ Hygiene o May shower; gently cleanse wound with antibacterial soap, rinse and pat dry prior to dressing wounds Edema Control - Lymphedema / Segmental Compressive Device / Other o Elevate, Exercise Daily and Avoid Standing for Long Periods of Time. o Elevate legs to the level of the heart and pump ankles as often as possible o Elevate leg(s) parallel to the floor when sitting. Additional Orders / Instructions o Follow Nutritious Diet and Increase Protein Intake Wound Treatment Wound #1 - Back Wound Laterality: Midline, Distal Cleanser: Normal Saline 1 x Per Day/30 Days Discharge Instructions: Wash your hands with soap and water. Remove old dressing, discard into plastic bag and place into trash. Cleanse the wound with Normal Saline  prior to applying a clean dressing using gauze sponges, not tissues or cotton balls. Do not scrub or use excessive force. Pat dry using gauze sponges, not tissue or cotton balls. Primary Dressing: Hydrofera Blue Classic Foam Rope Dressing, 9x6 (mm/in) 1 x Per Day/30 Days Discharge Instructions: cut rope in 1/4 lengthwise Secondary Dressing: Zetuvit Plus Silicone Non-bordered 5x5 (in/in) 1 x Per Day/30 Days Electronic Signature(s) Signed: 12/18/2021 4:40:54 PM By: [name] PA-[name] Signed: 12/19/2021 9:42:34 AM By: Elliot Gurney, BSN, RN, CWS, Kim RN, BSN Entered By: Elliot Gurney, BSN, RN, CWS, Kim on 12/18/2021 13:08:18 [name], [name] ([id number]) -------------------------------------------------------------------------------- Problem List Details Patient Name: [name] Dus  [name]. Date of Service: 12/18/2021 12:30 PM Medical Record Number: [id number] Patient Account Number: [id number] Date of Birth/Sex: [birth year] (60 y.o. 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 ([id number]) -------------------------------------------------------------------------------- Progress Note Details Patient Name: Kazmierczak, Lequisha [name]. Date of Service: 12/18/2021 12:30 PM Medical Record Number: [id number] Patient Account Number:  [id number] Date of Birth/Sex: Apr 16, 1961 (60 y.o. F) Treating RN: Huel Coventry Primary Care Provider: [name] Other Clinician: Referring Provider: Card, John Treating Provider/Extender: Rowan Blase in Treatment: 9 Subjective Chief Complaint Information obtained from Patient Surgical Back Ulcer History of Present Illness (HPI) 10/15/2021 upon evaluation today patient presents for initial evaluation here in the clinic concerning a surgical ulceration/dehiscence in the lumbar spine region following surgery that she had over the past year. This was actually broken up into 3 separate surgical events. The initial surgical intervention actually was on November 05, 2021 almost a year ago. Subsequently the patient went back in February for a seroma of the area which unfortunately required her to have a repeat surgery to go in and clean this out. And then again this occurred in April where she went back in and again they felt like stitches were coming out and there was an additional seroma. She was placed in a wound VAC initially and then subsequently as it got smaller that was discontinued. Again right now I will see anything that I think a wound VAC would help with. Nonetheless she definitely has a significant depth to the wound that is going require packing. I actually believe the Hydrofera Blue rope would probably do quite well with this the problem is as much as it is draining she probably needs this to be changed at least every day. She does not really have anyone that can help with that that is the complicating scenario here. With that being said the patient does have a history of multiple sclerosis, hypertension, and again this surgical wound dehiscence in regard to her lumbar spine region. She did have a repeat MRI which was actually completed 10/09/2021. This showed that there was no significant change in the subcutaneous fluid collection/track of the lower lumbar region. This is extending  to the level of the fascia unfortunately. This seems to go all the way from the L2 level with a track extending all the way to the fascia at the L4-5 level. Again this is a significant wound and there is significant drainage but does not seem to communicate to the spinal region as far as spinal fluid or otherwise is concerned that is good news. Nonetheless she last saw Dr. Franky Macho who is her neurosurgeon on 10/01/2021 that was when he ordered this last MRI she supposed to see him next week as well. With that being said he did not feel like there was any significant issue there but was not sure why this was not healing that is when he ordered the MRI. They were wanting to make sure that this was packed appropriately  by home health unfortunately the main issue currently is that home health is completely out of the picture as the patient has exhausted all the home health that that she gets for a year. She is now in a very difficult predicament where she does not have anyone to help her change the dressing and to be honest that she is not able to do it herself with the location of the wound being on the midline lumbar spine region. If she does not have anyone that can help it is probably can to be necessary for her to go to a facility for rehab and daily dressing changes as I feel like daily changes which is much drainage that she is having is going to be necessary. 10/23/2021 upon evaluation today patient appears to be doing decently well in regard to her back ulcer. This does seem to be draining a lot less than what it is been doing in the past. With that being said she still has quite a bit of drainage nonetheless. I do think that given time this should improve least I hope so. The good news is she does have home health coming out 3 days a week were doing it 2 days a week and she is paying someone we can to help. 10/30/2021 upon evaluation today patient appears to be doing okay in regard to her back ulcer  this is not draining quite as bad as it was in the beginning but he still has quite a bit of drainage noted. I do believe that the patient would benefit from [place] going ahead forward with attempting a wound VAC using the Hydrofera Blue rope to pack with and then subsequently using the VAC externally to actually suction out and help this to fill- in. I think this is our ideal way to try to get things cleared at this point. As it stands I am not certain that we are really making a progress that we want to see near with doing it in the way we are which is packing with the rope. It is a good dressing but I do think it is insufficient for total healing. She just seems to have too much in the way of drainage at this point unfortunately. 11/06/2021 upon evaluation today patient unfortunately continues to have issues with her back ongoing. The good news is her MRI that was repeated showed signs of the size of this area in the lumbar spine region having decreased from 4 cm to 3.5 cm this is definitely not bad news at all. With that being said unfortunately she continues to have issues with ongoing drainage not as severe as in the beginning but nonetheless still significant. I do think a wound VAC still would be a good way to go although her home health agency nurse apparently has some concerns about the possibility of not being able to keep a seal with this as they had struggles in the past. Nonetheless I explained to the patient that this is much different than what she had previous and that I really feel like it would do much better as far as getting the area taken care of without having any complications or issues here. I think that we should be able to maintain a seal. Nonetheless at this time I did discuss with the patient as well that she probably does need to have a wound VAC in order for [place] to get this moving in the right direction. 11/13/2021 upon evaluation patient's wound bed actually showed  signs of  significant drainage at this time. She did see the surgeon yesterday he did not see anything that appeared to be infected. Nonetheless he does appear that she is continuing to have areas here that just do not seem to want to seal up there MRI findings have been negative but nonetheless she continues to have is the seroma that is filling in. I do feel like we need to try to widen the hole so we can get at least a half of the [facility] then this will be better than nothing at this point. 11/20/2021 upon evaluation today patient actually appears to be having less pain at this point which is good news and overall she we still do not have the results of the culture back yet it had to be sent out to [facility] and we do not have the result back yet. Is doing decently well in regard to her wound. Fortunately there does not appear to be any signs of active infection systemically nor locally at this time. 11/27/2021 upon evaluation today patient appears to be doing well with regard to her wound all things considered there does appear to be less drainage than there was previous. Fortunately I do not see any evidence of worsening of the patient is stating that she is having some issues with back pain. This is somewhat new. Again this I think could be related to the fact that she is having some issues here with infection. We are still waiting to see what the result of her culture shows from susceptibility testing Enterococcus has been identified but we do not know if this is VRE or not. 12/04/2021 upon evaluation today patient appears to be doing well with regard to her wound all things considered. I did have a conversation with Erin from Dr. Sueanne Margarita office. Of note she notes that Dr. Drinda Butts really does not want to do anything surgical right now which I completely [name], [name] ([id number]) understand. With that being said I am still leery of how far we will make it getting this to heal short of any type  of surgery to open this up and allow [place] to more appropriately packed the wound. Nonetheless I will absolutely give it our best shot as far as that is concerned. I discussed that with the patient today. She voiced understanding. The good news is the drainage today seems to be clear it is no longer cloudy as it was previous I am actually very pleased in that regard. 12/11/2021 since have last seen the patient actually did have an conversation with Dr. Franky Macho with her neurosurgeon. Again this involve the discussion around whether or not to open the wound and try to apply a wound VAC following. With that being said the decision was made that that may be the best thing to do if the patient was in agreement. Nonetheless I am actually extremely encouraged with what I am seeing today much more than I would have thought. In fact I think that we may be over packing the wound which is why she is having some discomfort at this point as there is much less of the dressing able to get and this time compared to what we saw previous. That is actually really good news. In fact the 6:00 tunnel appears to have filled in is awesome news. At the 12:00 location I am actually able to pack into that area pretty effectively at this point today. In fact I was able to get less of  the packing in which I will detail below. 12/18/2021 upon evaluation today patient appears to be doing well with regard to her wound on the back. Fortunately there is no signs of active infection which is great news and overall very pleased with where things stand today. No fevers, chills, nausea, vomiting, or diarrhea. Objective Constitutional Well-nourished and well-hydrated in no acute distress. Vitals Time Taken: 12:51 PM, Weight: 275 lbs, Temperature: 98 F, [name]: 75 bpm, Respiratory Rate: 18 breaths/min, Blood Pressure: 134/82 mmHg. Respiratory normal breathing without difficulty. Psychiatric this patient is able to make decisions and  demonstrates good insight into disease process. Alert and Oriented x 3. pleasant and cooperative. General Notes: Upon inspection patient's wound bed showed signs of good granulation epithelization at this point. Fortunately I see no signs of active infection locally nor systemically. I do think that she mainly has a tunnel at 12:00 currently the 6:00 is completely closed I am pleased in that regard. Were also going to cut back on the size of the [facility] currently. Integumentary (Hair, Skin) Wound #1 status is Open. Original cause of wound was Surgical Injury. The date acquired was: 04/11/2021. The wound has been in treatment 9 weeks. The wound is located on the Distal,Midline Back. The wound measures 0.5cm length x 0.3cm width x 3.2cm depth; 0.118cm^2 area and 0.377cm^3 volume. There is Fat Layer (Subcutaneous Tissue) exposed. There is a large amount of serosanguineous drainage noted. There is large (67-100%) red granulation within the wound bed. There is no necrotic tissue within the wound bed. 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 Nurse Visit as needed - nurse visit on thursday Home Health: [facility] for wound care. May utilize formulary equivalent dressing for wound treatment orders unless otherwise specified. Home RENDA, POHLMAN ([id number]) Health Nurse may visit PRN to address patient s wound care needs. - 3 times per week [facility] fax [phone] Bathing/ Shower/ Hygiene: May shower; gently cleanse wound with antibacterial soap, rinse and pat dry prior to dressing wounds Edema Control - Lymphedema / Segmental Compressive Device / Other: Elevate, Exercise Daily and Avoid Standing for Long Periods of  Time. Elevate legs to the level of the heart and pump ankles as often as possible Elevate leg(s) parallel to the floor when sitting. Additional Orders / Instructions: Follow Nutritious Diet and Increase Protein Intake WOUND #1: - Back Wound Laterality: Midline, Distal Cleanser: Normal Saline 1 x Per Day/30 Days Discharge Instructions: Wash your hands with soap and water. Remove old dressing, discard into plastic bag and place into trash. Cleanse the wound with Normal Saline prior to applying a clean dressing using gauze sponges, not tissues or cotton balls. Do not scrub or use excessive force. Pat dry using gauze sponges, not tissue or cotton balls. Primary Dressing: Hydrofera Blue Classic Foam Rope Dressing, 9x6 (mm/in) 1 x Per Day/30 Days Discharge Instructions: cut rope in 1/4 lengthwise Secondary Dressing: Zetuvit Plus Silicone Non-bordered 5x5 (in/in) 1 x Per Day/30 Days 1. Would recommend currently that we continue with the [facility] that we can to cut back to just a quarter of the Hydrofera Blue rope as I do not feel like we need to put this in too tightly at this point it showing signs of significant improvement which is great news and hopefully this will allow this to continue  to heal appropriately. 2. I am also can recommend that we have the patient continue with the border foam dressing to cover which I think is doing a great job. We will see patient back for reevaluation in 1 week here in the clinic. If anything worsens or changes patient will contact our office for additional recommendations. Electronic Signature(s) Signed: 12/18/2021 2:21:39 PM By: [name] PA-[name] Entered By: [name] on 12/18/2021 14:21:38 Paskett, Glenis Salena Saner ([id number]) -------------------------------------------------------------------------------- SuperBill Details Patient Name: Digirolamo, Micha [name]. Date of Service: 12/18/2021 Medical Record Number: [id number] Patient Account Number:  [id number] Date of Birth/Sex: 1961-09-28 (60 y.o. 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 G35 Multiple sclerosis I10 Essential (primary) hypertension Facility Procedures CPT4 Code: 57846962 Description: 99213 - WOUND CARE VISIT-LEV 3 EST PT Modifier: Quantity: 1 Physician Procedures CPT4 Code: 9528413 Description: 99214 - WC PHYS LEVEL 4 - EST PT Modifier: Quantity: 1 CPT4 Code: Description: ICD-10 Diagnosis Description T81.31XA Disruption of external operation (surgical) wound, not elsewhere classifi 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

## 2021-12-20 ENCOUNTER — Other Ambulatory Visit: Payer: Self-pay

## 2021-12-20 DIAGNOSIS — T8131XA Disruption of external operation (surgical) wound, not elsewhere classified, initial encounter: Secondary | ICD-10-CM | POA: Diagnosis not present

## 2021-12-20 NOTE — Progress Notes (Signed)
RHYLIN, VENTERS ([id number]) Visit Report for 12/20/2021 Arrival Information Details Patient Name: Waight, MAKENLI DERSTINE. Date of Service: 12/20/2021 2:30 PM Medical Record Number: [id number] Patient Account Number: [id number] Date of Birth/Sex: [birth year] (60 y.o. F) Treating RN: Hansel Feinstein Primary Care Kjersten Ormiston: [name] Other Clinician: Referring Lexus Shampine: Card, John Treating Ladawn Boullion/Extender: Rowan Blase in Treatment: 9 Visit Information History Since Last Visit Added or deleted any medications: No Patient Arrived: [name] Had a fall or experienced change in No Arrival Time: 15:05 activities of daily living that may affect Accompanied By: self risk of falls: Transfer Assistance: None Hospitalized since last visit: No Patient Identification Verified: Yes Has Dressing in Place as Prescribed: Yes Secondary Verification Process Completed: Yes Pain Present Now: Yes Patient Requires Transmission-Based Precautions: No Patient Has Alerts: No Electronic Signature(s) Signed: 12/20/2021 3:38:35 PM By: Hansel Feinstein Entered By: Hansel Feinstein on 12/20/2021 15:05:21 Bir, Jaclynne CMarland Kitchen ([id number]) -------------------------------------------------------------------------------- Clinic Level of Care Assessment Details Patient Name: Noboa, Damiah C. Date of Service: 12/20/2021 2:30 PM Medical Record Number: [id number] Patient Account Number: [id number] Date of Birth/Sex: [birth year] (61 y.o. F) Treating RN: Hansel Feinstein Primary Care Lando Alcalde: CardJonny Ruiz Other Clinician: Referring Berkeley Veldman: Card, John Treating Aiman Sonn/Extender: Rowan Blase in Treatment: 9 Clinic Level of Care Assessment Items TOOL 4 Quantity Score []  - Use when only an EandM is performed on FOLLOW-UP visit 0 ASSESSMENTS - Nursing Assessment / Reassessment []  - Reassessment of Co-morbidities (includes updates in patient status) 0 []  - 0 Reassessment of Adherence to Treatment Plan ASSESSMENTS - Wound and  Skin Assessment / Reassessment X - Simple Wound Assessment / Reassessment - one wound 1 5 []  - 0 Complex Wound Assessment / Reassessment - multiple wounds []  - 0 Dermatologic / Skin Assessment (not related to wound area) ASSESSMENTS - Focused Assessment []  - Circumferential Edema Measurements - multi extremities 0 []  - 0 Nutritional Assessment / Counseling / Intervention []  - 0 Lower Extremity Assessment (monofilament, tuning fork, pulses) []  - 0 Peripheral Arterial Disease Assessment (using hand held doppler) ASSESSMENTS - Ostomy and/or Continence Assessment and Care []  - Incontinence Assessment and Management 0 []  - 0 Ostomy Care Assessment and Management (repouching, etc.) PROCESS - Coordination of Care X - Simple Patient / Family Education for ongoing care 1 15 []  - 0 Complex (extensive) Patient / Family Education for ongoing care []  - 0 Staff obtains , Records, Test Results / Process Orders []  - 0 Staff telephones HHA, Nursing Homes / Clarify orders / etc []  - 0 Routine Transfer to another Facility (non-emergent condition) []  - 0 Routine Hospital Admission (non-emergent condition) []  - 0 New Admissions / / Ordering NPWT, Apligraf, etc. []  - 0 Emergency Hospital Admission (emergent condition) X- 1 10 Simple Discharge Coordination []  - 0 Complex (extensive) Discharge Coordination PROCESS - Special Needs []  - Pediatric / Minor Patient Management 0 []  - 0 Isolation Patient Management []  - 0 Hearing / Language / Visual special needs []  - 0 Assessment of Community assistance (transportation, D/C planning, etc.) []  - 0 Additional assistance / Altered mentation []  - 0 Support Surface(s) Assessment (bed, cushion, seat, etc.) INTERVENTIONS - Wound Cleansing / Measurement Pinkham, Anani C. ( ) X- 1 5 Simple Wound Cleansing - one wound []  - 0 Complex Wound Cleansing - multiple wounds []  - 0 Wound Imaging (photographs - any  number of wounds) []  - 0 Wound Tracing (instead of photographs) X- 1 5 Simple Wound Measurement - one wound []  - 0 Complex Wound Measurement -  multiple wounds INTERVENTIONS - Wound Dressings X - Small Wound Dressing one or multiple wounds 1 10 []  - 0 Medium Wound Dressing one or multiple wounds []  - 0 Large Wound Dressing one or multiple wounds []  - 0 Application of Medications - topical []  - 0 Application of Medications - injection INTERVENTIONS - Miscellaneous []  - External ear exam 0 []  - 0 Specimen Collection (cultures, biopsies, blood, body fluids, etc.) []  - 0 Specimen(s) / Culture(s) sent or taken to Lab for analysis []  - 0 Patient Transfer (multiple staff / / Similar devices) []  - 0 Simple Staple / Suture removal (25 or less) []  - 0 Complex Staple / Suture removal (26 or more) []  - 0 Hypo / Hyperglycemic Management (close monitor of Blood Glucose) []  - 0 Ankle / Brachial Index (ABI) - do not check if billed separately []  - 0 Vital Signs Has the patient been seen at the hospital within the last three years: Yes Total Score: 50 Level Of Care: New/Established - Level 2 Electronic Signature(s) Signed: 12/20/2021 3:38:35 PM By: Entered By: on 12/20/2021 15:19:27 Janice, Jalene C ( ) -------------------------------------------------------------------------------- Encounter Discharge Information Details Patient Name: Boger, Kimberly C. Date of Service: 12/20/2021 2:30 PM Medical Record Number: Nurse, adult Patient Account Number: Date of Birth/Sex: January 07, 1961 (60 y.o. F) Treating RN: Primary Care Tianne Plott: Other Clinician: Referring Ferd Horrigan: Card, John Treating Wenceslaus Gist/Extender: 12/22/2021 in Treatment: 9 Encounter Discharge Information Items Discharge Condition: Stable Ambulatory Status: Walker Discharge Destination: Home Transportation: Private Auto Accompanied By:  self Schedule Follow-up Appointment: Yes Clinical Summary of Care: Electronic Signature(s) Signed: 12/20/2021 3:38:35 PM By: Hansel Feinstein Entered By: 12/22/2021 on 12/20/2021 15:19:03 Yum, Jersie C. ([id number]) -------------------------------------------------------------------------------- Wound Assessment Details Patient Name: Solivan, Malaiya C. Date of Service: 12/20/2021 2:30 PM Medical Record Number: [id number] Patient Account Number: [id number] Date of Birth/Sex: Dec 02, 1961 (60 y.o. F) Treating RN: Hansel Feinstein Primary Care Leander Tout: CardChristena Flake Other Clinician: Referring Lina Hitch: Card, John Treating Jalene Lacko/Extender: Rowan Blase in Treatment: 9 Wound Status Wound Number: 1 Primary Etiology: Dehisced Wound Wound Location: Distal, Midline Back Wound Status: Open Wounding Event: Surgical Injury Comorbid History: Hypertension Date Acquired: 04/11/2021 Weeks Of Treatment: 9 Clustered Wound: No Wound Measurements Length: (cm) 0.5 Width: (cm) 0.3 Depth: (cm) 3.2 Area: (cm) 0.118 Volume: (cm) 0.377 % Reduction in Area: 6.3% % Reduction in Volume: 34.8% Epithelialization: None Wound Description Classification: Full Thickness Without Exposed Support Structu Exudate Amount: Large Exudate Type: Serosanguineous Exudate Color: red, brown res Foul Odor After Cleansing: No Slough/Fibrino No Wound Bed Granulation Amount: Large (67-100%) Exposed Structure Granulation Quality: Red Fascia Exposed: No Necrotic Amount: None Present (0%) Fat Layer (Subcutaneous Tissue) Exposed: Yes Tendon Exposed: No Muscle Exposed: No Joint Exposed: No Bone Exposed: No Treatment Notes Wound #1 (Back) Wound Laterality: Midline, Distal Cleanser Normal Saline Discharge Instruction: Wash your hands with soap and water. Remove old dressing, discard into plastic bag and place into trash. Cleanse the wound with Normal Saline prior to applying a clean dressing using gauze sponges, not  tissues or cotton balls. Do not scrub or use excessive force. Pat dry using gauze sponges, not tissue or cotton balls. Peri-Wound Care Topical Primary Dressing Hydrofera Blue Classic Foam Rope Dressing, 9x6 (mm/in) Discharge Instruction: cut rope in 1/4 lengthwise AND LEAVE A TAIL OUT TO GRAB IT Secondary Dressing Zetuvit Plus Silicone Border Dressing 4x4 (in/in) Secured With Compression Wrap Compression Stockings Ehly, Maurisa C. (Hansel Feinstein) Add-Ons Electronic Signature(s) Signed:  12/20/2021 3:38:35 PM By: Hansel Feinstein Entered ByHansel Feinstein on 12/20/2021 15:16:44

## 2021-12-21 NOTE — Progress Notes (Signed)
[name] ([id number]) Visit Report for 12/20/2021 Physician Orders Details Patient Name: [name], [name] Date of Service: 12/20/2021 2:30 PM Medical Record Number: [id number] Patient Account Number: [id number] Date of Birth/Sex: 02-15-1961 (60 y.o. F) Treating RN: Hansel Feinstein Primary Care Provider: CardJonny Ruiz Other Clinician: Referring Provider: Card, John Treating Provider/Extender: Rowan Blase in Treatment: 9 Verbal / Phone Orders: No Diagnosis Coding Follow-up Appointments o Return Appointment in 2 weeks. - provider o Nurse Visit as needed - nurse visit on tuesday and thursday week of 12/26 Home Health o Home [facility] Company: [facility] o [facility] for wound care. May utilize formulary equivalent dressing for wound treatment orders unless otherwise specified. Home Health Nurse may visit PRN to address patientos wound care needs. - 3 times per week [facility] fax [phone] Bathing/ Shower/ Hygiene o May shower; gently cleanse wound with antibacterial soap, rinse and pat dry prior to dressing wounds o No tub bath. Anesthetic (Use 'Patient Medications' Section for Anesthetic Order Entry) o Lidocaine applied to wound bed Edema Control - Lymphedema / Segmental Compressive Device / Other o Elevate, Exercise Daily and Avoid Standing for Long Periods of Time. o Elevate legs to the level of the heart and pump ankles as often as possible o Elevate leg(s) parallel to the floor when sitting. Additional Orders / Instructions o Follow Nutritious Diet and Increase Protein Intake Wound Treatment Wound #1 - Back Wound Laterality: Midline, Distal Cleanser: Normal Saline 1 x Per Day/30 Days Discharge Instructions: Wash your hands with soap and water. Remove old dressing, discard into plastic bag and place into trash. Cleanse the wound with Normal Saline prior to applying a clean dressing using gauze sponges, not tissues or cotton balls. Do not  scrub or use excessive force. Pat dry using gauze sponges, not tissue or cotton balls. Primary Dressing: Hydrofera Blue Classic Foam Rope Dressing, 9x6 (mm/in) 1 x Per Day/30 Days Discharge Instructions: cut rope in 1/4 lengthwise AND LEAVE A TAIL OUT TO GRAB IT Secondary Dressing: Zetuvit Plus Silicone Border Dressing 4x4 (in/in) 1 x Per Day/30 Days Electronic Signature(s) Signed: 12/20/2021 3:38:35 PM By: Hansel Feinstein Signed: 12/20/2021 6:19:38 PM By: [name] PA-C Entered By: Hansel Feinstein on 12/20/2021 15:18:31 [name], [name] ([id number]) -------------------------------------------------------------------------------- SuperBill Details Patient Name: [name], [name] Date of Service: 12/20/2021 Medical Record Number: [id number] Patient Account Number: [id number] Date of Birth/Sex: [birth year] (60 y.o. 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

## 2021-12-25 ENCOUNTER — Other Ambulatory Visit: Payer: Self-pay

## 2021-12-25 DIAGNOSIS — T8131XA Disruption of external operation (surgical) wound, not elsewhere classified, initial encounter: Secondary | ICD-10-CM | POA: Diagnosis not present

## 2021-12-25 NOTE — Progress Notes (Signed)
DANEKA, LANTIGUA ([id number]) Visit Report for 12/25/2021 Arrival Information Details Patient Name: [name], [name] Date of Service: 12/25/2021 3:30 PM Medical Record Number: [id number] Patient Account Number: [id number] Date of Birth/Sex: 1961/07/01 (60 y.o. F) Treating RN: Yevonne Pax Primary Care Zykeria Laguardia: Card, Jonny Ruiz Other Clinician: Referring Clemens Lachman: Card, John Treating Demon Volante/Extender: Altamese B[place] in Treatment: 10 Visit Information History Since Last Visit All ordered tests and consults were completed: No Patient Arrived: [name] Added or deleted any medications: No Arrival Time: 15:45 Any new allergies or adverse reactions: No Accompanied By: self Had a fall or experienced change in No Transfer Assistance: None activities of daily living that may affect Patient Identification Verified: Yes risk of falls: Secondary Verification Process Completed: Yes Signs or symptoms of abuse/neglect since last visito No Patient Requires Transmission-Based Precautions: No Hospitalized since last visit: No Patient Has Alerts: No Implantable device outside of the clinic excluding No cellular tissue based products placed in the center since last visit: Has Dressing in Place as Prescribed: Yes Pain Present Now: No Electronic Signature(s) Signed: 12/25/2021 4:01:01 PM By: Yevonne Pax RN Entered By: Yevonne Pax on 12/25/2021 16:01:01 [name], [name] ([id number]) -------------------------------------------------------------------------------- Clinic Level of Care Assessment Details Patient Name: [name], [name] Date of Service: 12/25/2021 3:30 PM Medical Record Number: [id number] Patient Account Number: [id number] Date of Birth/Sex: Sep 18, 1961 (60 y.o. F) Treating RN: Yevonne Pax Primary Care Charvez Voorhies: Card, Jonny Ruiz Other Clinician: Referring Aiyanna Awtrey: Card, John Treating Mery Guadalupe/Extender: Altamese [place] in Treatment: 10 Clinic Level of Care  Assessment Items TOOL 4 Quantity Score X - Use when only an EandM is performed on FOLLOW-UP visit 1 0 ASSESSMENTS - Nursing Assessment / Reassessment X - Reassessment of Co-morbidities (includes updates in patient status) 1 10 X- 1 5 Reassessment of Adherence to Treatment Plan ASSESSMENTS - Wound and Skin Assessment / Reassessment X - Simple Wound Assessment / Reassessment - one wound 1 5 []  - 0 Complex Wound Assessment / Reassessment - multiple wounds []  - 0 Dermatologic / Skin Assessment (not related to wound area) ASSESSMENTS - Focused Assessment []  - Circumferential Edema Measurements - multi extremities 0 []  - 0 Nutritional Assessment / Counseling / Intervention []  - 0 Lower Extremity Assessment (monofilament, tuning fork, pulses) []  - 0 Peripheral Arterial Disease Assessment (using hand held doppler) ASSESSMENTS - Ostomy and/or Continence Assessment and Care []  - Incontinence Assessment and Management 0 []  - 0 Ostomy Care Assessment and Management (repouching, etc.) PROCESS - Coordination of Care []  - Simple Patient / Family Education for ongoing care 0 []  - 0 Complex (extensive) Patient / Family Education for ongoing care []  - 0 Staff obtains , Records, Test Results / Process Orders []  - 0 Staff telephones HHA, Nursing Homes / Clarify orders / etc []  - 0 Routine Transfer to another Facility (non-emergent condition) []  - 0 Routine Hospital Admission (non-emergent condition) []  - 0 New Admissions / / Ordering NPWT, Apligraf, etc. []  - 0 Emergency Hospital Admission (emergent condition) X- 1 10 Simple Discharge Coordination []  - 0 Complex (extensive) Discharge Coordination PROCESS - Special Needs []  - Pediatric / Minor Patient Management 0 []  - 0 Isolation Patient Management []  - 0 Hearing / Language / Visual special needs []  - 0 Assessment of Community assistance (transportation, D/[name] planning, etc.) []  - 0 Additional  assistance / Altered mentation []  - 0 Support Surface(s) Assessment (bed, cushion, seat, etc.) INTERVENTIONS - Wound Cleansing / Measurement [name], [name] ( ) X- 1 5 Simple Wound Cleansing -  one wound []  - 0 Complex Wound Cleansing - multiple wounds X- 1 5 Wound Imaging (photographs - any number of wounds) []  - 0 Wound Tracing (instead of photographs) X- 1 5 Simple Wound Measurement - one wound []  - 0 Complex Wound Measurement - multiple wounds INTERVENTIONS - Wound Dressings X - Small Wound Dressing one or multiple wounds 1 10 []  - 0 Medium Wound Dressing one or multiple wounds []  - 0 Large Wound Dressing one or multiple wounds []  - 0 Application of Medications - topical []  - 0 Application of Medications - injection INTERVENTIONS - Miscellaneous []  - External ear exam 0 []  - 0 Specimen Collection (cultures, biopsies, blood, body fluids, etc.) []  - 0 Specimen(s) / Culture(s) sent or taken to Lab for analysis []  - 0 Patient Transfer (multiple staff / / Similar devices) []  - 0 Simple Staple / Suture removal (25 or less) []  - 0 Complex Staple / Suture removal (26 or more) []  - 0 Hypo / Hyperglycemic Management (close monitor of Blood Glucose) []  - 0 Ankle / Brachial Index (ABI) - do not check if billed separately X- 1 5 Vital Signs Has the patient been seen at the hospital within the last three years: Yes Total Score: 60 Level Of Care: New/Established - Level 2 Electronic Signature(s) Unsigned Entered By on 12/25/2021 16:05:15 Signature(s): Date(s): [name], [name] ( ) -------------------------------------------------------------------------------- Encounter Discharge Information Details Patient Name: [name], [name] Date of Service: 12/25/2021 3:30 PM Medical Record Number: Patient Account Number: Date of Birth/Sex: 06/06/1961 (60 y.o. F) Treating RN: Primary Care  Nayleen [name]: Nurse, adult Other Clinician: Referring Marjan [name]: Card, John Treating Clevon Khader/Extender: in Treatment: 10 Encounter Discharge Information Items Discharge Condition: Stable Ambulatory Status: Walker Discharge Destination: Home Transportation: Private Auto Accompanied By: self Schedule Follow-up Appointment: Yes Clinical Summary of Care: Patient Declined Electronic Signature(s) Signed: 12/25/2021 4:04:19 PM By: RN Entered By: on 12/25/2021 16:04:19 [name], [name] (12/27/2021) -------------------------------------------------------------------------------- Wound Assessment Details Patient Name: [name], [name] Date of Service: 12/25/2021 3:30 PM Medical Record Number: [id number] Patient Account Number: 12/27/2021 Date of Birth/Sex: Sep 28, 1961 (60 y.o. F) Treating RN: 09/11/1961 Primary Care Leslie Jester: Card, 66 Other Clinician: Referring Soffia Doshier: Card, John Treating Deema Juncaj/Extender: Yevonne Pax in Treatment: 10 Wound Status Wound Number: 1 Primary Etiology: Dehisced Wound Wound Location: Distal, Midline Back Wound Status: Open Wounding Event: Surgical Injury Comorbid History: Hypertension Date Acquired: 04/11/2021 Weeks Of Treatment: 10 Clustered Wound: No Wound Measurements Length: (cm) 0.5 Width: (cm) 0.3 Depth: (cm) 3.2 Area: (cm) 0.118 Volume: (cm) 0.377 % Reduction in Area: 6.3% % Reduction in Volume: 34.8% Epithelialization: None Tunneling: No Undermining: No Wound Description Classification: Full Thickness Without Exposed Support Structu Exudate Amount: Medium Exudate Type: Serosanguineous Exudate Color: red, brown res Foul Odor After Cleansing: No Slough/Fibrino No Wound Bed Granulation Amount: Large (67-100%) Exposed Structure Granulation Quality: Red Fascia Exposed: No Necrotic Amount: None Present (0%) Fat Layer (Subcutaneous Tissue) Exposed: Yes Tendon Exposed:  No Muscle Exposed: No Joint Exposed: No Bone Exposed: No Treatment Notes Wound #1 (Back) Wound Laterality: Midline, Distal Cleanser Peri-Wound Care Topical Primary Dressing Secondary Dressing Secured With Compression Wrap Compression Stockings Add-Ons Electronic Signature(s) Signed: 12/25/2021 4:01:51 PM By: 12/27/2021 RN Entered By: Yevonne Pax on 12/25/2021 16:01:51

## 2021-12-25 NOTE — Progress Notes (Signed)
APRILE, DICKENSON ([id number]) Visit Report for 12/25/2021 Physician Orders Details Patient Name: Lagace, Jenay C. Date of Service: 12/25/2021 3:30 PM Medical Record Number: [id number] Patient Account Number: [id number] Date of Birth/Sex: 1961/02/02 (60 y.o. F) Treating RN: Yevonne Pax Primary Care Provider: [name] Other Clinician: Referring Provider: Card, John Treating Provider/Extender: Altamese [place] in Treatment: 10 Verbal / Phone Orders: No Diagnosis Coding Follow-up Appointments o Return Appointment in 1 week. o Nurse Visit as needed - nurse visit on tuesday and thursday week of 12/26 Home Health o [facility]: [facility] o [facility] for wound care. May utilize formulary equivalent dressing for wound treatment orders unless otherwise specified. Home Health Nurse may visit PRN to address patientos wound care needs. - 3 times per week [facility] fax [phone] Bathing/ Shower/ Hygiene o May shower; gently cleanse wound with antibacterial soap, rinse and pat dry prior to dressing wounds o No tub bath. Anesthetic (Use 'Patient Medications' Section for Anesthetic Order Entry) o Lidocaine applied to wound bed Edema Control - Lymphedema / Segmental Compressive Device / Other o Elevate, Exercise Daily and Avoid Standing for Long Periods of Time. o Elevate legs to the level of the heart and pump ankles as often as possible o Elevate leg(s) parallel to the floor when sitting. Additional Orders / Instructions o Follow Nutritious Diet and Increase Protein Intake Wound Treatment Wound #1 - Back Wound Laterality: Midline, Distal Cleanser: Normal Saline 1 x Per Day/30 Days Discharge Instructions: Wash your hands with soap and water. Remove old dressing, discard into plastic bag and place into trash. Cleanse the wound with Normal Saline prior to applying a clean dressing using gauze sponges, not tissues or cotton balls. Do not scrub or  use excessive force. Pat dry using gauze sponges, not tissue or cotton balls. Primary Dressing: Hydrofera Blue Classic Foam Rope Dressing, 9x6 (mm/in) 1 x Per Day/30 Days Discharge Instructions: cut rope in 1/4 lengthwise AND LEAVE A TAIL OUT TO GRAB IT Secondary Dressing: Zetuvit Plus Silicone Border Dressing 4x4 (in/in) 1 x Per Day/30 Days Electronic Signature(s) Signed: 12/25/2021 4:03:54 PM By: Yevonne Pax RN Signed: 12/25/2021 4:21:37 PM By: Baltazar Najjar MD Entered By: Yevonne Pax on 12/25/2021 16:03:53 Lucatero, Gean C. ([id number]) -------------------------------------------------------------------------------- SuperBill Details Patient Name: Grimes, Sharis C. Date of Service: 12/25/2021 Medical Record Number: [id number] Patient Account Number: [id number] Date of Birth/Sex: [birth year] (60 y.o. 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

## 2021-12-27 ENCOUNTER — Other Ambulatory Visit: Payer: Self-pay

## 2021-12-27 DIAGNOSIS — T8131XA Disruption of external operation (surgical) wound, not elsewhere classified, initial encounter: Secondary | ICD-10-CM | POA: Diagnosis not present

## 2021-12-27 NOTE — Progress Notes (Signed)
EULALA, NEWCOMBE ([id number]) Visit Report for 12/27/2021 Arrival Information Details Patient Name: [name], [name] Date of Service: 12/27/2021 10:00 AM Medical Record Number: [id number] Patient Account Number: [id number] Date of Birth/Sex: [birth year] (60 y.o. F) Treating RN: Hansel Feinstein Primary Care Breckon Reeves: CardJonny Ruiz Other Clinician: Referring Brelee Renk: Card, John Treating Camey Edell/Extender: Altamese [place] in Treatment: 10 Visit Information History Since Last Visit Added or deleted any medications: No Patient Arrived: Ambulatory Had a fall or experienced change in No Arrival Time: 10:00 activities of daily living that may affect Accompanied By: self risk of falls: Transfer Assistance: None Hospitalized since last visit: No Patient Identification Verified: Yes Has Dressing in Place as Prescribed: Yes Secondary Verification Process Completed: Yes Pain Present Now: No Patient Requires Transmission-Based Precautions: No Patient Has Alerts: No Electronic Signature(s) Signed: 12/27/2021 10:53:46 AM By: Hansel Feinstein Entered By: Hansel Feinstein on 12/27/2021 10:09:50 [name], [name] ([id number]) -------------------------------------------------------------------------------- Clinic Level of Care Assessment Details Patient Name: [name], [name] Date of Service: 12/27/2021 10:00 AM Medical Record Number: [id number] Patient Account Number: [id number] Date of Birth/Sex: [birth year] (60 y.o. F) Treating RN: Hansel Feinstein Primary Care Briane Birden: Card, Jonny Ruiz Other Clinician: Referring Matyas Baisley: Card, John Treating Yisroel Mullendore/Extender: Altamese [place] in Treatment: 10 Clinic Level of Care Assessment Items TOOL 4 Quantity Score []  - Use when only an EandM is performed on FOLLOW-UP visit 0 ASSESSMENTS - Nursing Assessment / Reassessment []  - Reassessment of Co-morbidities (includes updates in patient status) 0 []  - 0 Reassessment of Adherence to Treatment  Plan ASSESSMENTS - Wound and Skin Assessment / Reassessment X - Simple Wound Assessment / Reassessment - one wound 1 5 []  - 0 Complex Wound Assessment / Reassessment - multiple wounds []  - 0 Dermatologic / Skin Assessment (not related to wound area) ASSESSMENTS - Focused Assessment []  - Circumferential Edema Measurements - multi extremities 0 []  - 0 Nutritional Assessment / Counseling / Intervention []  - 0 Lower Extremity Assessment (monofilament, tuning fork, pulses) []  - 0 Peripheral Arterial Disease Assessment (using hand held doppler) ASSESSMENTS - Ostomy and/or Continence Assessment and Care []  - Incontinence Assessment and Management 0 []  - 0 Ostomy Care Assessment and Management (repouching, etc.) PROCESS - Coordination of Care X - Simple Patient / Family Education for ongoing care 1 15 []  - 0 Complex (extensive) Patient / Family Education for ongoing care []  - 0 Staff obtains , Records, Test Results / Process Orders []  - 0 Staff telephones HHA, Nursing Homes / Clarify orders / etc []  - 0 Routine Transfer to another Facility (non-emergent condition) []  - 0 Routine Hospital Admission (non-emergent condition) []  - 0 New Admissions / / Ordering NPWT, Apligraf, etc. []  - 0 Emergency Hospital Admission (emergent condition) X- 1 10 Simple Discharge Coordination []  - 0 Complex (extensive) Discharge Coordination PROCESS - Special Needs []  - Pediatric / Minor Patient Management 0 []  - 0 Isolation Patient Management []  - 0 Hearing / Language / Visual special needs []  - 0 Assessment of Community assistance (transportation, D/C planning, etc.) []  - 0 Additional assistance / Altered mentation []  - 0 Support Surface(s) Assessment (bed, cushion, seat, etc.) INTERVENTIONS - Wound Cleansing / Measurement Nickey, Andilyn C. ( ) X- 1 5 Simple Wound Cleansing - one wound []  - 0 Complex Wound Cleansing - multiple wounds []  -  0 Wound Imaging (photographs - any number of wounds) []  - 0 Wound Tracing (instead of photographs) []  - 0 Simple Wound Measurement - one wound []  - 0 Complex Wound  Measurement - multiple wounds INTERVENTIONS - Wound Dressings X - Small Wound Dressing one or multiple wounds 1 10 []  - 0 Medium Wound Dressing one or multiple wounds []  - 0 Large Wound Dressing one or multiple wounds []  - 0 Application of Medications - topical []  - 0 Application of Medications - injection INTERVENTIONS - Miscellaneous []  - External ear exam 0 []  - 0 Specimen Collection (cultures, biopsies, blood, body fluids, etc.) []  - 0 Specimen(s) / Culture(s) sent or taken to Lab for analysis []  - 0 Patient Transfer (multiple staff / / Similar devices) []  - 0 Simple Staple / Suture removal (25 or less) []  - 0 Complex Staple / Suture removal (26 or more) []  - 0 Hypo / Hyperglycemic Management (close monitor of Blood Glucose) []  - 0 Ankle / Brachial Index (ABI) - do not check if billed separately []  - 0 Vital Signs Has the patient been seen at the hospital within the last three years: Yes Total Score: 45 Level Of Care: New/Established - Level 2 Electronic Signature(s) Signed: 12/27/2021 10:53:46 AM By: Entered By: on 12/27/2021 10:15:36 [name], [name] ( ) -------------------------------------------------------------------------------- Encounter Discharge Information Details Patient Name: [name], [name] Date of Service: 12/27/2021 10:00 AM Medical Record Number: Nurse, adult Patient Account Number: Date of Birth/Sex: 07-20-1961 (60 y.o. F) Treating RN: Primary Care Margaretann Abate: Card Other Clinician: Referring Dorothymae Maciver: Card, John Treating Brynlyn Dade/Extender: 12/29/2021 in Treatment: 10 Encounter Discharge Information Items Discharge Condition: Stable Ambulatory Status: Walker Discharge Destination:  Home Transportation: Private Auto Accompanied By: self Schedule Follow-up Appointment: Yes Clinical Summary of Care: Electronic Signature(s) Signed: 12/27/2021 10:53:46 AM By: Hansel Feinstein Entered By: 12/29/2021 on 12/27/2021 10:15:17 [name], [name] ([id number]) -------------------------------------------------------------------------------- Wound Assessment Details Patient Name: [name], [name] Date of Service: 12/27/2021 10:00 AM Medical Record Number: [id number] Patient Account Number: [id number] Date of Birth/Sex: [birth year] (60 y.o. F) Treating RN: Hansel Feinstein Primary Care Eliya Geiman: Card, Jonny Ruiz Other Clinician: Referring Glen Blatchley: Card, John Treating Oneil Behney/Extender: Altamese [place] in Treatment: 10 Wound Status Wound Number: 1 Primary Etiology: Dehisced Wound Wound Location: Distal, Midline Back Wound Status: Open Wounding Event: Surgical Injury Comorbid History: Hypertension Date Acquired: 04/11/2021 Weeks Of Treatment: 10 Clustered Wound: No Wound Measurements Length: (cm) 0.5 Width: (cm) 0.3 Depth: (cm) 3.2 Area: (cm) 0.118 Volume: (cm) 0.377 % Reduction in Area: 6.3% % Reduction in Volume: 34.8% Epithelialization: None Wound Description Classification: Full Thickness Without Exposed Support Structu Exudate Amount: Large Exudate Type: Serous Exudate Color: amber res Foul Odor After Cleansing: No Slough/Fibrino No Wound Bed Granulation Amount: Large (67-100%) Exposed Structure Granulation Quality: Red Fascia Exposed: No Necrotic Amount: None Present (0%) Fat Layer (Subcutaneous Tissue) Exposed: Yes Tendon Exposed: No Muscle Exposed: No Joint Exposed: No Bone Exposed: No Treatment Notes Wound #1 (Back) Wound Laterality: Midline, Distal Cleanser Normal Saline Discharge Instruction: Wash your hands with soap and water. Remove old dressing, discard into plastic bag and place into trash. Cleanse the wound with Normal Saline prior to  applying a clean dressing using gauze sponges, not tissues or cotton balls. Do not scrub or use excessive force. Pat dry using gauze sponges, not tissue or cotton balls. Peri-Wound Care Topical Primary Dressing Hydrofera Blue Classic Foam Rope Dressing, 9x6 (mm/in) Discharge Instruction: cut rope in 1/4 lengthwise AND LEAVE A TAIL OUT TO GRAB IT Secondary Dressing Zetuvit Plus Silicone Border Dressing 4x4 (in/in) Secured With Compression Wrap Compression Stockings [name], [name] (Hansel Feinstein) Add-Ons  Electronic Signature(s) Signed: 12/27/2021 10:53:46 AM By: Hansel Feinstein Entered ByHansel Feinstein on 12/27/2021 10:13:28

## 2021-12-28 NOTE — Progress Notes (Signed)
[name], [name] ([id number]) Visit Report for 12/27/2021 Physician Orders Details Patient Name: [name], [name] Date of Service: 12/27/2021 10:00 AM Medical Record Number: [id number] Patient Account Number: [id number] Date of Birth/Sex: [birth year] (60 y.o. F) Treating RN: Hansel Feinstein Primary Care Provider: CardJonny Ruiz Other Clinician: Referring Provider: Card, John Treating Provider/Extender: Altamese [place] in Treatment: 10 Verbal / Phone Orders: No Diagnosis Coding Follow-up Appointments o Return Appointment in 1 week. o Nurse Visit as needed - nurse visit on tuesday and thursday Home Health o [facility]: [facility] o [facility] for wound care. May utilize formulary equivalent dressing for wound treatment orders unless otherwise specified. Home Health Nurse may visit PRN to address patientos wound care needs. - 3 times per week [facility] fax [phone] Bathing/ Shower/ Hygiene o May shower; gently cleanse wound with antibacterial soap, rinse and pat dry prior to dressing wounds o No tub bath. Anesthetic (Use 'Patient Medications' Section for Anesthetic Order Entry) o Lidocaine applied to wound bed Edema Control - Lymphedema / Segmental Compressive Device / Other o Elevate, Exercise Daily and Avoid Standing for Long Periods of Time. o Elevate legs to the level of the heart and pump ankles as often as possible o Elevate leg(s) parallel to the floor when sitting. Additional Orders / Instructions o Follow Nutritious Diet and Increase Protein Intake Wound Treatment Wound #1 - Back Wound Laterality: Midline, Distal Cleanser: Normal Saline 1 x Per Day/30 Days Discharge Instructions: Wash your hands with soap and water. Remove old dressing, discard into plastic bag and place into trash. Cleanse the wound with Normal Saline prior to applying a clean dressing using gauze sponges, not tissues or cotton balls. Do not scrub or use excessive  force. Pat dry using gauze sponges, not tissue or cotton balls. Primary Dressing: Hydrofera Blue Classic Foam Rope Dressing, 9x6 (mm/in) 1 x Per Day/30 Days Discharge Instructions: cut rope in 1/4 lengthwise AND LEAVE A TAIL OUT TO GRAB IT Secondary Dressing: Zetuvit Plus Silicone Border Dressing 4x4 (in/in) 1 x Per Day/30 Days Electronic Signature(s) Signed: 12/27/2021 10:53:46 AM By: Hansel Feinstein Signed: 12/28/2021 4:08:28 PM By: Baltazar Najjar MD Entered By: Hansel Feinstein on 12/27/2021 10:14:53 Hornik, Skyelyn C. ([id number]) -------------------------------------------------------------------------------- SuperBill Details Patient Name: [name], Chelan C. Date of Service: 12/27/2021 Medical Record Number: [id number] Patient Account Number: [id number] Date of Birth/Sex: 11-Oct-1961 (60 y.o. 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

## 2022-01-01 ENCOUNTER — Encounter: Payer: 59 | Attending: Physician Assistant | Admitting: Physician Assistant

## 2022-01-01 ENCOUNTER — Other Ambulatory Visit: Payer: Self-pay

## 2022-01-01 DIAGNOSIS — Y838 Other surgical procedures as the cause of abnormal reaction of the patient, or of later complication, without mention of misadventure at the time of the procedure: Secondary | ICD-10-CM | POA: Diagnosis not present

## 2022-01-01 DIAGNOSIS — L98422 Non-pressure chronic ulcer of back with fat layer exposed: Secondary | ICD-10-CM | POA: Insufficient documentation

## 2022-01-01 DIAGNOSIS — G35 Multiple sclerosis: Secondary | ICD-10-CM | POA: Diagnosis not present

## 2022-01-01 DIAGNOSIS — I10 Essential (primary) hypertension: Secondary | ICD-10-CM | POA: Diagnosis not present

## 2022-01-01 DIAGNOSIS — T8131XA Disruption of external operation (surgical) wound, not elsewhere classified, initial encounter: Secondary | ICD-10-CM | POA: Diagnosis not present

## 2022-01-01 NOTE — Progress Notes (Signed)
DENALI, SHARMA ([id number]) Visit Report for 01/01/2022 Arrival Information Details Patient Name: [name], [name] Date of Service: 01/01/2022 12:30 PM Medical Record Number: [id number] Patient Account Number: [id number] Date of Birth/Sex: Feb 23, 1961 (61 y.o. F) Treating RN: Yevonne Pax Primary Care Lamyiah Crawshaw: Card, Jonny Ruiz Other Clinician: Referring Kristine Chahal: Card, John Treating Lottie Sigman/Extender: Rowan Blase in Treatment: 11 Visit Information History Since Last Visit All ordered tests and consults were completed: No Patient Arrived: [name] Added or deleted any medications: No Arrival Time: 12:37 Any new allergies or adverse reactions: No Accompanied By: self Had a fall or experienced change in No Transfer Assistance: None activities of daily living that may affect Patient Identification Verified: Yes risk of falls: Secondary Verification Process Completed: Yes Signs or symptoms of abuse/neglect since last visito No Patient Requires Transmission-Based Precautions: No Hospitalized since last visit: No Patient Has Alerts: No Implantable device outside of the clinic excluding No cellular tissue based products placed in the center since last visit: Has Dressing in Place as Prescribed: Yes Pain Present Now: No Electronic Signature(s) Signed: 01/01/2022 3:55:11 PM By: Yevonne Pax RN Entered By: Yevonne Pax on 01/01/2022 12:41:00 Notte, Hazelynn C. ([id number]) -------------------------------------------------------------------------------- Clinic Level of Care Assessment Details Patient Name: [name], [name] Date of Service: 01/01/2022 12:30 PM Medical Record Number: [id number] Patient Account Number: [id number] Date of Birth/Sex: [birth year] (61 y.o. F) Treating RN: Yevonne Pax Primary Care Revonda Menter: Card, Jonny Ruiz Other Clinician: Referring Tanielle Emigh: Card, John Treating Benna Arno/Extender: Rowan Blase in Treatment: 11 Clinic Level of Care Assessment Items TOOL 4  Quantity Score X - Use when only an EandM is performed on FOLLOW-UP visit 1 0 ASSESSMENTS - Nursing Assessment / Reassessment X - Reassessment of Co-morbidities (includes updates in patient status) 1 10 X- 1 5 Reassessment of Adherence to Treatment Plan ASSESSMENTS - Wound and Skin Assessment / Reassessment X - Simple Wound Assessment / Reassessment - one wound 1 5 []  - 0 Complex Wound Assessment / Reassessment - multiple wounds []  - 0 Dermatologic / Skin Assessment (not related to wound area) ASSESSMENTS - Focused Assessment []  - Circumferential Edema Measurements - multi extremities 0 []  - 0 Nutritional Assessment / Counseling / Intervention []  - 0 Lower Extremity Assessment (monofilament, tuning fork, pulses) []  - 0 Peripheral Arterial Disease Assessment (using hand held doppler) ASSESSMENTS - Ostomy and/or Continence Assessment and Care []  - Incontinence Assessment and Management 0 []  - 0 Ostomy Care Assessment and Management (repouching, etc.) PROCESS - Coordination of Care X - Simple Patient / Family Education for ongoing care 1 15 []  - 0 Complex (extensive) Patient / Family Education for ongoing care []  - 0 Staff obtains , Records, Test Results / Process Orders []  - 0 Staff telephones HHA, Nursing Homes / Clarify orders / etc []  - 0 Routine Transfer to another Facility (non-emergent condition) []  - 0 Routine Hospital Admission (non-emergent condition) []  - 0 New Admissions / / Ordering NPWT, Apligraf, etc. []  - 0 Emergency Hospital Admission (emergent condition) X- 1 10 Simple Discharge Coordination []  - 0 Complex (extensive) Discharge Coordination PROCESS - Special Needs []  - Pediatric / Minor Patient Management 0 []  - 0 Isolation Patient Management []  - 0 Hearing / Language / Visual special needs []  - 0 Assessment of Community assistance (transportation, D/C planning, etc.) []  - 0 Additional assistance / Altered  mentation []  - 0 Support Surface(s) Assessment (bed, cushion, seat, etc.) INTERVENTIONS - Wound Cleansing / Measurement Suppa, Denene C. ( ) X- 1 5 Simple Wound Cleansing -  one wound  - 0 Complex Wound Cleansing - multiple wounds X- 1 5 Wound Imaging (photographs - any number of wounds)  - 0 Wound Tracing (instead of photographs) X- 1 5 Simple Wound Measurement - one wound  - 0 Complex Wound Measurement - multiple wounds INTERVENTIONS - Wound Dressings  - Small Wound Dressing one or multiple wounds 0 X- 1 15 Medium Wound Dressing one or multiple wounds  - 0 Large Wound Dressing one or multiple wounds  - 0 Application of Medications - topical  - 0 Application of Medications - injection INTERVENTIONS - Miscellaneous  - External ear exam 0  - 0 Specimen Collection (cultures, biopsies, blood, body fluids, etc.)  - 0 Specimen(s) / Culture(s) sent or taken to Lab for analysis  - 0 Patient Transfer (multiple staff / Nurse, adult / Similar devices)  - 0 Simple Staple / Suture removal (25 or less)  - 0 Complex Staple / Suture removal (26 or more)  - 0 Hypo / Hyperglycemic Management (close monitor of Blood Glucose)  - 0 Ankle / Brachial Index (ABI) - do not check if billed separately X- 1 5 Vital Signs Has the patient been seen at the hospital within the last three years: Yes Total Score: 80 Level Of Care: New/Established - Level 3 Electronic Signature(s) Signed: 01/01/2022 3:55:11 PM By: Yevonne Pax RN Entered By: Yevonne Pax on 01/01/2022 13:00:12 Noffsinger, Teretha CMarland Kitchen ([id number]) -------------------------------------------------------------------------------- Encounter Discharge Information Details Patient Name: [name], [name] Date of Service: 01/01/2022 12:30 PM Medical Record Number: [id number] Patient Account Number: [id number] Date of Birth/Sex: Apr 10, 1961 (61 y.o. F) Treating RN: Yevonne Pax Primary Care Lyla Jasek: [name] Other Clinician: Referring Tennis Mckinnon: Card, John Treating Lahela Woodin/Extender: Rowan Blase in Treatment: 11 Encounter Discharge Information Items Discharge Condition: Stable Ambulatory Status: Walker Discharge Destination: Home Transportation: Private Auto Accompanied By: self Schedule Follow-up Appointment: Yes Clinical Summary of Care: Patient Declined Electronic Signature(s) Signed: 01/01/2022 3:55:11 PM By: Yevonne Pax RN Entered By: Yevonne Pax on 01/01/2022 13:02:31 Navarrette, Celita C. ([id number]) -------------------------------------------------------------------------------- Lower Extremity Assessment Details Patient Name: Burgert, Jovana C. Date of Service: 01/01/2022 12:30 PM Medical Record Number: [id number] Patient Account Number: [id number] Date of Birth/Sex: 04-18-1961 (61 y.o. F) Treating RN: Yevonne Pax Primary Care Saphyra Hutt: [name] Other Clinician: Referring Adana Marik: Card, John Treating Allison Silva/Extender: Rowan Blase in Treatment: 11 Electronic Signature(s) Signed: 01/01/2022 3:55:11 PM By: Yevonne Pax RN Entered By: Yevonne Pax on 01/01/2022 12:47:24 Pigue, Kasidi CMarland Kitchen ([id number]) -------------------------------------------------------------------------------- Multi Wound Chart Details Patient Name: Chura, Elena C. Date of Service: 01/01/2022 12:30 PM Medical Record Number: [id number] Patient Account Number: [id number] Date of Birth/Sex: 1961-12-20 (61 y.o. F) Treating RN: Yevonne Pax Primary Care Reizy Dunlow: [name] Other Clinician: Referring Edwen Mclester: Card, John Treating Shade Kaley/Extender: Rowan Blase in Treatment: 11 Vital Signs Height(in): Pulse(bpm): 96 Weight(lbs): 275 Blood Pressure(mmHg): 122/81 Body Mass Index(BMI): Temperature(F): 98.1 Respiratory Rate(breaths/min): 18 Photos: [1:No Photos] [N/A:N/A] Wound Location: [1:Distal, Midline Back] [N/A:N/A] Wounding Event: [1:Surgical Injury] [N/A:N/A] Primary  Etiology: [1:Dehisced Wound] [N/A:N/A] Comorbid History: [1:Hypertension] [N/A:N/A] Date Acquired: [1:04/11/2021] [N/A:N/A] Weeks of Treatment: [1:11] [N/A:N/A] Wound Status: [1:Open] [N/A:N/A] Measurements L x W x D (cm) [1:0.4x0.3x3.2] [N/A:N/A] Area (cm) : [1:0.094] [N/A:N/A] Volume (cm) : [1:0.302] [N/A:N/A] % Reduction in Area: [1:25.40%] [N/A:N/A] % Reduction in Volume: [1:47.80%] [N/A:N/A] Classification: [1:Full Thickness Without Exposed Support Structures] [N/A:N/A] Exudate Amount: [1:Large] [N/A:N/A] Exudate Type: [1:Serous] [N/A:N/A] Exudate Color: [1:amber] [N/A:N/A] Granulation Amount: [1:Large (67-100%)] [N/A:N/A] Granulation Quality: [1:Red] [N/A:N/A] Necrotic Amount: [1:None Present (0%)] [N/A:N/A] Exposed  Structures: [1:Fat Layer (Subcutaneous Tissue): Yes Fascia: No Tendon: No Muscle: No Joint: No Bone: No None] [N/A:N/A N/A] Treatment Notes Electronic Signature(s) Signed: 01/01/2022 3:55:11 PM By: Yevonne Pax RN Entered By: Yevonne Pax on 01/01/2022 12:58:34 Sorce, Shantrell Salena Saner ([id number]) -------------------------------------------------------------------------------- Multi-Disciplinary Care Plan Details Patient Name: Adamczak, Marissa C. Date of Service: 01/01/2022 12:30 PM Medical Record Number: [id number] Patient Account Number: [id number] Date of Birth/Sex: 1961-07-22 (61 y.o. F) Treating RN: Yevonne Pax Primary Care Patrycja Mumpower: [name] Other Clinician: Referring Celia Friedland: Card, John Treating Avrey Hyser/Extender: Rowan Blase in Treatment: 11 Active Inactive Wound/Skin Impairment Nursing Diagnoses: Knowledge deficit related to ulceration/compromised skin integrity Goals: Patient/caregiver will verbalize understanding of skin care regimen Date Initiated: 10/15/2021 Target Resolution Date: 12/15/2021 Goal Status: Active Ulcer/skin breakdown will have a volume reduction of 30% by week 4 Date Initiated: 10/15/2021 Target Resolution Date:  12/15/2021 Goal Status: Active Ulcer/skin breakdown will have a volume reduction of 50% by week 8 Date Initiated: 10/15/2021 Target Resolution Date: 01/15/2022 Goal Status: Active Ulcer/skin breakdown will have a volume reduction of 80% by week 12 Date Initiated: 10/15/2021 Target Resolution Date: 02/15/2022 Goal Status: Active Ulcer/skin breakdown will heal within 14 weeks Date Initiated: 10/15/2021 Target Resolution Date: 03/15/2022 Goal Status: Active Interventions: Assess patient/caregiver ability to obtain necessary supplies Assess patient/caregiver ability to perform ulcer/skin care regimen upon admission and as needed Assess ulceration(s) every visit Notes: Electronic Signature(s) Signed: 01/01/2022 3:55:11 PM By: Yevonne Pax RN Entered By: Yevonne Pax on 01/01/2022 12:58:20 Veilleux, Quinisha C. ([id number]) -------------------------------------------------------------------------------- Pain Assessment Details Patient Name: Nangle, Naw C. Date of Service: 01/01/2022 12:30 PM Medical Record Number: [id number] Patient Account Number: [id number] Date of Birth/Sex: 22-Apr-1961 (61 y.o. F) Treating RN: Yevonne Pax Primary Care Doniesha Landau: [name] Other Clinician: Referring Ogle Hoeffner: Card, John Treating Emmaleah Meroney/Extender: Rowan Blase in Treatment: 11 Active Problems Location of Pain Severity and Description of Pain Patient Has Paino Yes Site Locations With Dressing Change: Yes Duration of the Pain. Constant / Intermittento Constant Rate the pain. Current Pain Level: 4 Worst Pain Level: 5 Least Pain Level: 4 Tolerable Pain Level: 5 Character of Pain Describe the Pain: Shooting Pain Management and Medication Current Pain Management: Medication: Yes Cold Application: No Rest: Yes Massage: No Activity: No T.E.N.S.: No Heat Application: No Leg drop or elevation: No Is the Current Pain Management Adequate: Inadequate How does your wound impact your activities  of daily livingo Sleep: No Bathing: No Appetite: No Relationship With Others: No Bladder Continence: No Emotions: No Bowel Continence: No Work: No Toileting: No Drive: No Dressing: No Hobbies: No Electronic Signature(s) Signed: 01/01/2022 3:55:11 PM By: Yevonne Pax RN Entered By: Yevonne Pax on 01/01/2022 12:42:40 Gielow, Analyce Salena Saner ([id number]) -------------------------------------------------------------------------------- Patient/Caregiver Education Details Patient Name: Ostlund, Carmelite C. Date of Service: 01/01/2022 12:30 PM Medical Record Number: [id number] Patient Account Number: [id number] Date of Birth/Gender: 1961-01-06 (61 y.o. F) Treating RN: Yevonne Pax Primary Care Physician: [name] Other Clinician: Referring Physician: Card, John Treating Physician/Extender: Rowan Blase in Treatment: 11 Education Assessment Education Provided To: Patient Education Topics Provided Wound/Skin Impairment: Methods: Explain/Verbal Responses: State content correctly Electronic Signature(s) Signed: 01/01/2022 3:55:11 PM By: Yevonne Pax RN Entered By: Yevonne Pax on 01/01/2022 13:01:39 Greff, Jermiah C. ([id number]) -------------------------------------------------------------------------------- Wound Assessment Details Patient Name: Schriver, Pahoua C. Date of Service: 01/01/2022 12:30 PM Medical Record Number: [id number] Patient Account Number: [id number] Date of Birth/Sex: [birth year] (61 y.o. F) Treating RN: Yevonne Pax Primary Care Mate Alegria: [name] Other Clinician: Referring Horace Wishon: Card, John Treating Ekam Bonebrake/Extender: Larina Bras,  Hoyt Weeks in Treatment: 11 Wound Status Wound Number: 1 Primary Etiology: Dehisced Wound Wound Location: Distal, Midline Back Wound Status: Open Wounding Event: Surgical Injury Comorbid History: Hypertension Date Acquired: 04/11/2021 Weeks Of Treatment: 11 Clustered Wound: No Wound Measurements Length: (cm) 0.4 Width: (cm)  0.3 Depth: (cm) 3.2 Area: (cm) 0.094 Volume: (cm) 0.302 % Reduction in Area: 25.4% % Reduction in Volume: 47.8% Epithelialization: None Tunneling: No Undermining: No Wound Description Classification: Full Thickness Without Exposed Support Structures Exudate Amount: Large Exudate Type: Serous Exudate Color: amber Foul Odor After Cleansing: No Slough/Fibrino No Wound Bed Granulation Amount: Large (67-100%) Exposed Structure Granulation Quality: Red Fascia Exposed: No Necrotic Amount: None Present (0%) Fat Layer (Subcutaneous Tissue) Exposed: Yes Tendon Exposed: No Muscle Exposed: No Joint Exposed: No Bone Exposed: No Treatment Notes Wound #1 (Back) Wound Laterality: Midline, Distal Cleanser Normal Saline Discharge Instruction: Wash your hands with soap and water. Remove old dressing, discard into plastic bag and place into trash. Cleanse the wound with Normal Saline prior to applying a clean dressing using gauze sponges, not tissues or cotton balls. Do not scrub or use excessive force. Pat dry using gauze sponges, not tissue or cotton balls. Peri-Wound Care Topical Primary Dressing Hydrofera Blue Classic Foam Rope Dressing, 9x6 (mm/in) Discharge Instruction: cut rope in 1/4 lengthwise AND LEAVE A TAIL OUT TO GRAB IT Secondary Dressing Zetuvit Plus Silicone Border Dressing 4x4 (in/in) Secured With Compression Wrap Compression Stockings MARETTA, OVERDORF ([id number]) Add-Ons Electronic Signature(s) Signed: 01/01/2022 3:55:11 PM By: Yevonne Pax RN Entered By: Yevonne Pax on 01/01/2022 12:57:59 Stamper, Jalina C. ([id number]) -------------------------------------------------------------------------------- Vitals Details Patient Name: Donovan, Lessa C. Date of Service: 01/01/2022 12:30 PM Medical Record Number: [id number] Patient Account Number: [id number] Date of Birth/Sex: 09-Aug-1961 (61 y.o. F) Treating RN: Yevonne Pax Primary Care Tinsley Everman: [name] Other  Clinician: Referring Ethelean Colla: Card, John Treating Laquitha Heslin/Extender: Rowan Blase in Treatment: 11 Vital Signs Time Taken: 12:41 Temperature (F): 98.1 Weight (lbs): 275 Pulse (bpm): 96 Respiratory Rate (breaths/min): 18 Blood Pressure (mmHg): 122/81 Reference Range: 80 - 120 mg / dl Electronic Signature(s) Signed: 01/01/2022 3:55:11 PM By: Yevonne Pax RN Entered By: Yevonne Pax on 01/01/2022 12:41:56

## 2022-01-01 NOTE — Progress Notes (Addendum)
GEM, SAYSON ([id number]) Visit Report for 01/01/2022 Chief Complaint Document Details Patient Name: [name], [name] Medical Record Number: [id number] Patient Account Number: [id number] Date of Birth/Sex: 08/02/1961 (61 y.o. F) Treating RN: Carlene Coria Primary Care Provider: Clayborn Heron Other Clinician: Referring Provider: Card, John Treating Provider/Extender: Skipper Cliche in Treatment: 11 Information Obtained from: Patient Chief Complaint Surgical Back Ulcer Electronic Signature(s) Signed: 01/01/2022 12:48:46 [name] By: Worthy Keeler PA-C Entered By: Worthy Keeler on 01/01/2022 12:48:45 Bora, [name] ([id number]) -------------------------------------------------------------------------------- HPI Details Patient Name: [name], [name] Medical Record Number: [id number] Patient Account Number: [id number] Date of Birth/Sex: April 16, 1961 (61 y.o. F) Treating RN: Carlene Coria Primary Care Provider: Clayborn Heron Other Clinician: Referring Provider: Card, John Treating Provider/Extender: Skipper Cliche in Treatment: 11 History of Present Illness HPI Description: 10/15/2021 upon evaluation today patient presents for initial evaluation here in the clinic concerning a surgical ulceration/dehiscence in the lumbar spine region following surgery that she had over the past year. This was actually broken up into 3 separate surgical events. The initial surgical intervention actually was on November 05, 2021 almost a year ago. Subsequently the patient went back in February for a seroma of the area which unfortunately required her to have a repeat surgery to go in and clean this out. And then again this occurred in April where she went back in and again they felt like stitches were coming out and there was an additional seroma. She was placed in a wound VAC initially and then subsequently as it got smaller that was  discontinued. Again right now I will see anything that I think a wound VAC would help with. Nonetheless she definitely has a significant depth to the wound that is going require packing. I actually believe the Hydrofera Blue rope would probably do quite well with this the problem is as much as it is draining she probably needs this to be changed at least every day. She does not really have anyone that can help with that that is the complicating scenario here. With that being said the patient does have a history of multiple sclerosis, hypertension, and again this surgical wound dehiscence in regard to her lumbar spine region. She did have a repeat MRI which was actually completed 10/09/2021. This showed that there was no significant change in the subcutaneous fluid collection/track of the lower lumbar region. This is extending to the level of the fascia unfortunately. This seems to go all the way from the L2 level with a track extending all the way to the fascia at the L4-5 level. Again this is a significant wound and there is significant drainage but does not seem to communicate to the spinal region as far as spinal fluid or otherwise is concerned that is good news. Nonetheless she last saw Dr. Christella Noa who is her neurosurgeon on 10/01/2021 that was when he ordered this last MRI she supposed to see him next week as well. With that being said he did not feel like there was any significant issue there but was not sure why this was not healing that is when he ordered the MRI. They were wanting to make sure that this was packed appropriately by home health unfortunately the main issue currently is that home health is completely out of the picture as the patient has exhausted all the home health that that she gets for a year. She is now in a very difficult  predicament where she does not have anyone to help her change the dressing and to be honest that she is not able to do it herself with the location of the  wound being on the midline lumbar spine region. If she does not have anyone that can help it is probably can to be necessary for her to go to a facility for rehab and daily dressing changes as I feel like daily changes which is much drainage that she is having is going to be necessary. 10/23/2021 upon evaluation today patient appears to be doing decently well in regard to her back ulcer. This does seem to be draining a lot less than what it is been doing in the past. With that being said she still has quite a bit of drainage nonetheless. I do think that given time this should improve least I hope so. The good news is she does have home health coming out 3 days a week were doing it 2 days a week and she is paying someone we can to help. 10/30/2021 upon evaluation today patient appears to be doing okay in regard to her back ulcer this is not draining quite as bad as it was in the beginning but he still has quite a bit of drainage noted. I do believe that the patient would benefit from [place] going ahead forward with attempting a wound VAC using the Hydrofera Blue rope to pack with and then subsequently using the VAC externally to actually suction out and help this to fill- in. I think this is our ideal way to try to get things cleared at this point. As it stands I am not certain that we are really making a progress that we want to see near with doing it in the way we are which is packing with the rope. It is a good dressing but I do think it is insufficient for total healing. She just seems to have too much in the way of drainage at this point unfortunately. 11/06/2021 upon evaluation today patient unfortunately continues to have issues with her back ongoing. The good news is her MRI that was repeated showed signs of the size of this area in the lumbar spine region having decreased from 4 cm to 3.5 cm this is definitely not bad news at all. With that being said unfortunately she continues to have issues with  ongoing drainage not as severe as in the beginning but nonetheless still significant. I do think a wound VAC still would be a good way to go although her home health agency nurse apparently has some concerns about the possibility of not being able to keep a seal with this as they had struggles in the past. Nonetheless I explained to the patient that this is much different than what she had previous and that I really feel like it would do much better as far as getting the area taken care of without having any complications or issues here. I think that we should be able to maintain a seal. Nonetheless at this time I did discuss with the patient as well that she probably does need to have a wound VAC in order for [place] to get this moving in the right direction. 11/13/2021 upon evaluation patient's wound bed actually showed signs of significant drainage at this time. She did see the surgeon yesterday he did not see anything that appeared to be infected. Nonetheless he does appear that she is continuing to have areas here that just do not seem  to want to seal up there MRI findings have been negative but nonetheless she continues to have is the seroma that is filling in. I do feel like we need to try to widen the hole so we can get at least a half of the [facility] then this will be better than nothing at this point. 11/20/2021 upon evaluation today patient actually appears to be having less pain at this point which is good news and overall she we still do not have the results of the culture back yet it had to be sent out to [facility] and we do not have the result back yet. Is doing decently well in regard to her wound. Fortunately there does not appear to be any signs of active infection systemically nor locally at this time. 11/27/2021 upon evaluation today patient appears to be doing well with regard to her wound all things considered there does appear to be less drainage than there was previous.  Fortunately I do not see any evidence of worsening of the patient is stating that she is having some issues with back pain. This is somewhat new. Again this I think could be related to the fact that she is having some issues here with infection. We are still waiting to see what the result of her culture shows from susceptibility testing Enterococcus has been identified but we do not know if this is VRE or not. 12/04/2021 upon evaluation today patient appears to be doing well with regard to her wound all things considered. I did have a conversation with Erin from Dr. Lacy Duverney office. Of note she notes that Dr. Joetta Manners really does not want to do anything surgical right now which I completely understand. With that being said I am still leery of how far we will make it getting this to heal short of any type of surgery to open this up and allow [place] to more appropriately packed the wound. Nonetheless I will absolutely give it our best shot as far as that is concerned. I discussed that with the patient today. She voiced understanding. The good news is the drainage today seems to be clear it is no longer cloudy as it was previous I am actually very pleased in that regard. TERIONA, HELLIWELL ([id number]) 12/11/2021 since have last seen the patient actually did have an conversation with Dr. Christella Noa with her neurosurgeon. Again this involve the discussion around whether or not to open the wound and try to apply a wound VAC following. With that being said the decision was made that that may be the best thing to do if the patient was in agreement. Nonetheless I am actually extremely encouraged with what I am seeing today much more than I would have thought. In fact I think that we may be over packing the wound which is why she is having some discomfort at this point as there is much less of the dressing able to get and this time compared to what we saw previous. That is actually really good news. In fact the 6:00  tunnel appears to have filled in is awesome news. At the 12:00 location I am actually able to pack into that area pretty effectively at this point today. In fact I was able to get less of the packing in which I will detail below. 12/18/2021 upon evaluation today patient appears to be doing well with regard to her wound on the back. Fortunately there is no signs of active infection which is great news and overall  very pleased with where things stand today. No fevers, chills, nausea, vomiting, or diarrhea. 01/01/2022 upon evaluation today patient appears to be doing decently well in regard to her wound. Fortunately there does not appear to be any signs of anything worsening which is great news. She still continues to have issues with drainage but this is not nearly as significant as what it was in the past. There is all clear drainage no signs of purulence noted. Electronic Signature(s) Signed: 01/01/2022 1:06:53 [name] By: [name] PA-C Entered By: [name] on 01/01/2022 13:06:53 [name], [name] ([id number]) -------------------------------------------------------------------------------- Physical Exam Details Patient Name: Ewings, Azlee C. Date of Service: 01/01/2022 12:30 [name] Medical Record Number: [id number] Patient Account Number: [id number] Date of Birth/Sex: [birth year] (61 y.o. F) Treating RN: Yevonne Pax Primary Care Provider: [name] Other Clinician: Referring Provider: Card, John Treating Provider/Extender: Rowan Blase in Treatment: 11 Constitutional Obese and well-hydrated in no acute distress. Respiratory normal breathing without difficulty. Psychiatric this patient is able to make decisions and demonstrates good insight into disease process. Alert and Oriented x 3. pleasant and cooperative. Notes Upon inspection patient's wound bed showed signs of good improvement with regard to the depth of the wound and tunneling area. This is just can be a very small  improvement little by little and is can take some time to be perfectly honest. I do not think this is going to be a quick and easy resolution but nonetheless I do feel like we are making some progress here. Electronic Signature(s) Signed: 01/01/2022 1:07:18 [name] By: [name] PA-C Entered By: [name] on 01/01/2022 13:07:18 Blakeman, [name] ([id number]) -------------------------------------------------------------------------------- Physician Orders Details Patient Name: Sacks, Neveyah C. Date of Service: 01/01/2022 12:30 [name] Medical Record Number: [id number] Patient Account Number: [id number] Date of Birth/Sex: 1961-06-20 (61 y.o. 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 o Nurse Visit as needed - nurse visit on tuesday and thursday Home Health o Home Health Company: [facility] o [facility] for wound care. May utilize formulary equivalent dressing for wound treatment orders unless otherwise specified. Home Health Nurse may visit PRN to address patientos wound care needs. - 3 times per week [facility] fax [phone] Bathing/ Shower/ Hygiene o May shower; gently cleanse wound with antibacterial soap, rinse and pat dry prior to dressing wounds o No tub bath. Anesthetic (Use 'Patient Medications' Section for Anesthetic Order Entry) o Lidocaine applied to wound bed Edema Control - Lymphedema / Segmental Compressive Device / Other o Elevate, Exercise Daily and Avoid  Standing for Long Periods of Time. o Elevate legs to the level of the heart and pump ankles as often as possible o Elevate leg(s) parallel to the floor when sitting. Additional Orders / Instructions o Follow Nutritious Diet and Increase Protein Intake Wound Treatment Wound #1 - Back Wound Laterality: Midline, Distal Cleanser: Normal Saline 1 x Per Day/30 Days Discharge Instructions: Wash your hands with soap and water. Remove old dressing, discard into plastic bag and place into trash. Cleanse the wound with Normal Saline prior to applying a clean dressing using gauze sponges, not tissues or cotton  balls. Do not scrub or use excessive force. Pat dry using gauze sponges, not tissue or cotton balls. Primary Dressing: Hydrofera Blue Classic Foam Rope Dressing, 9x6 (mm/in) 1 x Per Day/30 Days Discharge Instructions: cut rope in 1/4 lengthwise AND LEAVE A TAIL OUT TO GRAB IT Secondary Dressing: Zetuvit Plus Silicone Border Dressing 4x4 (in/in) 1 x Per Day/30 Days Notes may omit sunday dressing changes Electronic Signature(s) Signed: 01/01/2022 3:55:11 [name] By: Carlene Coria RN Signed: 01/01/2022 4:50:46 [name] By: Worthy Keeler PA-C Entered By: Carlene Coria on 01/01/2022 13:05:36 [name], [name] ([id number]) [name], [name] ([id number]) -------------------------------------------------------------------------------- Problem List Details Patient Name: Arrasmith, Natoshia C. Date of Service: 01/01/2022 12:30 [name] Medical Record Number: [id number] Patient Account Number: [id number] Date of Birth/Sex: [birth year] (61 y.o. 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 ([id number]) -------------------------------------------------------------------------------- Progress Note Details Patient Name: [name], [name] Medical Record Number: [id number] Patient Account Number: [id number] Date of Birth/Sex: 1961/08/29 (61 y.o. F) Treating RN: Carlene Coria Primary Care Provider: Clayborn Heron Other Clinician: Referring Provider: Card, John Treating Provider/Extender: Skipper Cliche in Treatment: 11 Subjective Chief Complaint Information obtained from Patient Surgical Back Ulcer History of Present Illness (HPI) 10/15/2021 upon evaluation today patient presents for initial evaluation here in the clinic concerning a surgical ulceration/dehiscence in the lumbar spine region following surgery that she had over the past year. This was actually broken up into 3 separate surgical events. The initial surgical intervention actually was on November 05, 2021 almost a year ago. Subsequently the patient went back in February for a seroma of the area which unfortunately required her to have a repeat surgery to go in and clean this out. And then again this occurred in April where she went back in and again they felt like stitches were coming out and there was an additional seroma. She was placed in a wound VAC initially and then subsequently as it got smaller that was discontinued. Again right now I will see anything that I think a wound VAC would help with. Nonetheless she definitely has a significant depth to the wound that is going require packing. I actually believe  the Hydrofera Blue rope would probably do quite well with this the problem is as much as it is draining she probably needs this to be changed at least every day. She does not really have anyone that can help with that that is the complicating scenario here. With that being said the patient does have a history of multiple sclerosis, hypertension, and again this surgical wound dehiscence in regard to her lumbar spine region. She did have a repeat MRI which was actually completed 10/09/2021. This showed that there was no significant change in the subcutaneous fluid collection/track of the lower lumbar region. This is extending to the level of the fascia unfortunately. This seems to go all the way from the L2 level with a track extending all the way to the  fascia at the L4-5 level. Again this is a significant wound and there is significant drainage but does not seem to communicate to the spinal region as far as spinal fluid or otherwise is concerned that is good news. Nonetheless she last saw Dr. Christella Noa who is her neurosurgeon on 10/01/2021 that was when he ordered this last MRI she supposed to see him next week as well. With that being said he did not feel like there was any significant issue there but was not sure why this was not healing that is when he ordered the MRI. They were wanting to make sure that this was packed appropriately by home health unfortunately the main issue currently is that home health is completely out of the picture as the patient has exhausted all the home health that that she gets for a year. She is now in a very difficult predicament where she does not have anyone to help her change the dressing and to be honest that she is not able to do it herself with the location of the wound being on the midline lumbar spine region. If she does not have anyone that can help it is probably can to be necessary for her to go to a facility for rehab and daily dressing changes as I feel like  daily changes which is much drainage that she is having is going to be necessary. 10/23/2021 upon evaluation today patient appears to be doing decently well in regard to her back ulcer. This does seem to be draining a lot less than what it is been doing in the past. With that being said she still has quite a bit of drainage nonetheless. I do think that given time this should improve least I hope so. The good news is she does have home health coming out 3 days a week were doing it 2 days a week and she is paying someone we can to help. 10/30/2021 upon evaluation today patient appears to be doing okay in regard to her back ulcer this is not draining quite as bad as it was in the beginning but he still has quite a bit of drainage noted. I do believe that the patient would benefit from [place] going ahead forward with attempting a wound VAC using the Hydrofera Blue rope to pack with and then subsequently using the VAC externally to actually suction out and help this to fill- in. I think this is our ideal way to try to get things cleared at this point. As it stands I am not certain that we are really making a progress that we want to see near with doing it in the way we are which is packing with the rope. It is a good dressing but I do think it is insufficient for total healing. She just seems to have too much in the way of drainage at this point unfortunately. 11/06/2021 upon evaluation today patient unfortunately continues to have issues with her back ongoing. The good news is her MRI that was repeated showed signs of the size of this area in the lumbar spine region having decreased from 4 cm to 3.5 cm this is definitely not bad news at all. With that being said unfortunately she continues to have issues with ongoing drainage not as severe as in the beginning but nonetheless still significant. I do think a wound VAC still would be a good way to go although her home health agency nurse apparently has some  concerns about the possibility of not  being able to keep a seal with this as they had struggles in the past. Nonetheless I explained to the patient that this is much different than what she had previous and that I really feel like it would do much better as far as getting the area taken care of without having any complications or issues here. I think that we should be able to maintain a seal. Nonetheless at this time I did discuss with the patient as well that she probably does need to have a wound VAC in order for [place] to get this moving in the right direction. 11/13/2021 upon evaluation patient's wound bed actually showed signs of significant drainage at this time. She did see the surgeon yesterday he did not see anything that appeared to be infected. Nonetheless he does appear that she is continuing to have areas here that just do not seem to want to seal up there MRI findings have been negative but nonetheless she continues to have is the seroma that is filling in. I do feel like we need to try to widen the hole so we can get at least a half of the [facility] [place] then this will be better than nothing at this point. 11/20/2021 upon evaluation today patient actually appears to be having less pain at this point which is good news and overall she we still do not have the results of the culture back yet it had to be sent out to [place] and we do not have the result back yet. Is doing decently well in regard to her wound. Fortunately there does not appear to be any signs of active infection systemically nor locally at this time. 11/27/2021 upon evaluation today patient appears to be doing well with regard to her wound all things considered there does appear to be less drainage than there was previous. Fortunately I do not see any evidence of worsening of the patient is stating that she is having some issues with back pain. This is somewhat new. Again this I think could be related to the fact that she is  having some issues here with infection. We are still waiting to see what the result of her culture shows from susceptibility testing Enterococcus has been identified but we do not know if this is VRE or not. 12/04/2021 upon evaluation today patient appears to be doing well with regard to her wound all things considered. I did have a conversation with Erin from Dr. Lacy Duverney office. Of note she notes that Dr. Joetta Manners really does not want to do anything surgical right now which I completely [name], [name] ([id number]) understand. With that being said I am still leery of how far we will make it getting this to heal short of any type of surgery to open this up and allow [place] to more appropriately packed the wound. Nonetheless I will absolutely give it our best shot as far as that is concerned. I discussed that with the patient today. She voiced understanding. The good news is the drainage today seems to be clear it is no longer cloudy as it was previous I am actually very pleased in that regard. 12/11/2021 since have last seen the patient actually did have an conversation with Dr. Christella Noa with her neurosurgeon. Again this involve the discussion around whether or not to open the wound and try to apply a wound VAC following. With that being said the decision was made that that may be the best thing to do if the patient was in  agreement. Nonetheless I am actually extremely encouraged with what I am seeing today much more than I would have thought. In fact I think that we may be over packing the wound which is why she is having some discomfort at this point as there is much less of the dressing able to get and this time compared to what we saw previous. That is actually really good news. In fact the 6:00 tunnel appears to have filled in is awesome news. At the 12:00 location I am actually able to pack into that area pretty effectively at this point today. In fact I was able to get less of the packing in  which I will detail below. 12/18/2021 upon evaluation today patient appears to be doing well with regard to her wound on the back. Fortunately there is no signs of active infection which is great news and overall very pleased with where things stand today. No fevers, chills, nausea, vomiting, or diarrhea. 01/01/2022 upon evaluation today patient appears to be doing decently well in regard to her wound. Fortunately there does not appear to be any signs of anything worsening which is great news. She still continues to have issues with drainage but this is not nearly as significant as what it was in the past. There is all clear drainage no signs of purulence noted. Objective Constitutional Obese and well-hydrated in no acute distress. Vitals Time Taken: 12:41 [name], Weight: 275 lbs, Temperature: 98.1 F, [name]: 96 bpm, Respiratory Rate: 18 breaths/min, Blood Pressure: 122/81 mmHg. Respiratory normal breathing without difficulty. Psychiatric this patient is able to make decisions and demonstrates good insight into disease process. Alert and Oriented x 3. pleasant and cooperative. General Notes: Upon inspection patient's wound bed showed signs of good improvement with regard to the depth of the wound and tunneling area. This is just can be a very small improvement little by little and is can take some time to be perfectly honest. I do not think this is going to be a quick and easy resolution but nonetheless I do feel like we are making some progress here. Integumentary (Hair, Skin) Wound #1 status is Open. Original cause of wound was Surgical Injury. The date acquired was: 04/11/2021. The wound has been in treatment 11 weeks. The wound is located on the [place] Back. The wound measures 0.4cm length x 0.3cm width x 3.2cm depth; 0.094cm^2 area and 0.302cm^3 volume. There is Fat Layer (Subcutaneous Tissue) exposed. There is no tunneling or undermining noted. There is a large amount of serous drainage  noted. There is large (67-100%) red granulation within the wound bed. There is no necrotic tissue within the wound bed. 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 Nurse Visit as needed - nurse visit on tuesday and [place]: [place]: - [place] for wound care. May utilize formulary equivalent dressing for wound treatment orders unless otherwise specified. Home Health Nurse may visit [facility] to address patient s wound care needs. - 3 times per week [facility] fax [phone] Bathing/ Shower/ Hygiene: May shower; gently cleanse wound with antibacterial soap, rinse and pat dry prior to dressing wounds No tub bath. Anesthetic (Use 'Patient Medications' Section for Anesthetic Order Entry): Lidocaine applied to wound bed Edema Control - Lymphedema / Segmental Compressive Device / Other: Elevate, Exercise  Daily and Avoid Standing for Long Periods of Time. Elevate legs to the level of the heart and pump ankles as often as possible Elevate leg(s) parallel to the floor when sitting. Additional Orders / Instructions: Follow Nutritious Diet and Increase Protein Intake General Notes: may omit sunday dressing changes WOUND #1: - Back Wound Laterality: Midline, Distal Cleanser: Normal Saline 1 x Per Day/30 Days Discharge Instructions: Wash your hands with soap and water. Remove old dressing, discard into plastic bag and place into trash. Cleanse the wound with Normal Saline prior to applying a clean dressing using gauze sponges, not tissues or cotton balls. Do not scrub or use excessive force. Pat dry using gauze sponges, not tissue or cotton balls. Primary Dressing: Hydrofera  Blue Classic Foam Rope Dressing, 9x6 (mm/in) 1 x Per Day/30 Days Discharge Instructions: cut rope in 1/4 lengthwise AND LEAVE A TAIL OUT TO GRAB IT Secondary Dressing: Zetuvit Plus Silicone Border Dressing 4x4 (in/in) 1 x Per Day/30 Days 1. Would recommend currently that we continue with the Hydrofera Blue rope I think this is still the best way to go. 2. I am also can recommend that we go ahead and have the patient continue with the Zetuvit border foam dressing to cover. 3. I would also recommend patient continue to have this changed daily that I think she can skip Sundays the other days she has it covered for getting this changed either by home health, eyes, or her friend on Saturday. We will see patient back for reevaluation in 1 week here in the clinic. If anything worsens or changes patient will contact our office for additional recommendations. Electronic Signature(s) Signed: 01/01/2022 1:07:54 [name] By: Worthy Keeler PA-C Entered By: Worthy Keeler on 01/01/2022 13:07:53 [name], [name] ([id number]) -------------------------------------------------------------------------------- SuperBill Details Patient Name: [name], [name] Date of Service: 01/01/2022 Medical Record Number: [id number] Patient Account Number: [id number] Date of Birth/Sex: April 24, 1961 (61 y.o. 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 G35 Multiple sclerosis I10 Essential (primary) hypertension Facility Procedures CPT4 Code: [id number] Description: 99213 - WOUND CARE VISIT-LEV 3 EST PT Modifier: Quantity: 1 Physician Procedures CPT4 Code: [id number] Description: [id number]  - WC PHYS LEVEL 3 - EST PT Modifier: Quantity: 1 CPT4 Code: Description: ICD-10 Diagnosis Description T81.31XA Disruption of external operation (surgical) wound, not elsewhere classifi 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

## 2022-01-03 ENCOUNTER — Other Ambulatory Visit: Payer: Self-pay

## 2022-01-03 DIAGNOSIS — T8131XA Disruption of external operation (surgical) wound, not elsewhere classified, initial encounter: Secondary | ICD-10-CM | POA: Diagnosis not present

## 2022-01-04 NOTE — Progress Notes (Signed)
LEONORA, GORES ([id number]) Visit Report for 01/03/2022 Arrival Information Details Patient Name: [name], [name] Date of Service: 01/03/2022 3:30 PM Medical Record Number: [id number] Patient Account Number: [id number] Date of Birth/Sex: 06-13-1961 (61 y.o. F) Treating RN: Huel Coventry Primary Care Zamiyah Resendes: [name] Other Clinician: Referring Sumedha Munnerlyn: Card, John Treating Malin Sambrano/Extender: Rowan Blase in Treatment: 11 Visit Information History Since Last Visit Added or deleted any medications: No Patient Arrived: Walker Has Dressing in Place as Prescribed: Yes Arrival Time: 15:30 Pain Present Now: No Transfer Assistance: None Patient Identification Verified: Yes Secondary Verification Process Completed: Yes Patient Requires Transmission-Based Precautions: No Patient Has Alerts: No Electronic Signature(s) Signed: 01/04/2022 5:16:08 PM By: Elliot Gurney, BSN, RN, CWS, Kim RN, BSN Entered By: Elliot Gurney, BSN, RN, CWS, Kim on 01/03/2022 15:37:48 [name], [name] ([id number]) -------------------------------------------------------------------------------- Clinic Level of Care Assessment Details Patient Name: [name], [name] Date of Service: 01/03/2022 3:30 PM Medical Record Number: [id number] Patient Account Number: [id number] Date of Birth/Sex: 07/08/1961 (61 y.o. F) Treating RN: Huel Coventry Primary Care Francis Doenges: [name] Other Clinician: Referring Hikeem Andersson: Card, John Treating Virdia Ziesmer/Extender: Rowan Blase in Treatment: 11 Clinic Level of Care Assessment Items TOOL 4 Quantity Score []  - Use when only an EandM is performed on FOLLOW-UP visit 0 ASSESSMENTS - Nursing Assessment / Reassessment []  - Reassessment of Co-morbidities (includes updates in patient status) 0 []  - 0 Reassessment of Adherence to Treatment Plan ASSESSMENTS - Wound and Skin Assessment / Reassessment X - Simple Wound Assessment / Reassessment - one wound 1 5 []  - 0 Complex Wound Assessment /  Reassessment - multiple wounds []  - 0 Dermatologic / Skin Assessment (not related to wound area) ASSESSMENTS - Focused Assessment []  - Circumferential Edema Measurements - multi extremities 0 []  - 0 Nutritional Assessment / Counseling / Intervention []  - 0 Lower Extremity Assessment (monofilament, tuning fork, pulses) []  - 0 Peripheral Arterial Disease Assessment (using hand held doppler) ASSESSMENTS - Ostomy and/or Continence Assessment and Care []  - Incontinence Assessment and Management 0 []  - 0 Ostomy Care Assessment and Management (repouching, etc.) PROCESS - Coordination of Care X - Simple Patient / Family Education for ongoing care 1 15 []  - 0 Complex (extensive) Patient / Family Education for ongoing care []  - 0 Staff obtains , Records, Test Results / Process Orders []  - 0 Staff telephones HHA, Nursing Homes / Clarify orders / etc []  - 0 Routine Transfer to another Facility (non-emergent condition) []  - 0 Routine Hospital Admission (non-emergent condition) []  - 0 New Admissions / / Ordering NPWT, Apligraf, etc. []  - 0 Emergency Hospital Admission (emergent condition) X- 1 10 Simple Discharge Coordination []  - 0 Complex (extensive) Discharge Coordination PROCESS - Special Needs []  - Pediatric / Minor Patient Management 0 []  - 0 Isolation Patient Management []  - 0 Hearing / Language / Visual special needs []  - 0 Assessment of Community assistance (transportation, D/C planning, etc.) []  - 0 Additional assistance / Altered mentation []  - 0 Support Surface(s) Assessment (bed, cushion, seat, etc.) INTERVENTIONS - Wound Cleansing / Measurement Colello, Myndi C. ( ) X- 1 5 Simple Wound Cleansing - one wound []  - 0 Complex Wound Cleansing - multiple wounds X- 1 5 Wound Imaging (photographs - any number of wounds) []  - 0 Wound Tracing (instead of photographs) X- 1 5 Simple Wound Measurement - one wound []  - 0 Complex  Wound Measurement - multiple wounds INTERVENTIONS - Wound Dressings []  - Small Wound Dressing one or multiple wounds 0 X- 1  15 Medium Wound Dressing one or multiple wounds  - 0 Large Wound Dressing one or multiple wounds  - 0 Application of Medications - topical  - 0 Application of Medications - injection INTERVENTIONS - Miscellaneous  - External ear exam 0  - 0 Specimen Collection (cultures, biopsies, blood, body fluids, etc.)  - 0 Specimen(s) / Culture(s) sent or taken to Lab for analysis  - 0 Patient Transfer (multiple staff / Nurse, adult / Similar devices)  - 0 Simple Staple / Suture removal (25 or less)  - 0 Complex Staple / Suture removal (26 or more)  - 0 Hypo / Hyperglycemic Management (close monitor of Blood Glucose)  - 0 Ankle / Brachial Index (ABI) - do not check if billed separately X- 1 5 Vital Signs Has the patient been seen at the hospital within the last three years: Yes Total Score: 65 Level Of Care: New/Established - Level 2 Electronic Signature(s) Signed: 01/04/2022 5:16:08 PM By: Elliot Gurney, BSN, RN, CWS, Kim RN, BSN Entered By: Elliot Gurney, BSN, RN, CWS, Kim on 01/03/2022 15:40:29 [name], [name] ([id number]) -------------------------------------------------------------------------------- Encounter Discharge Information Details Patient Name: [name], [name] Date of Service: 01/03/2022 3:30 PM Medical Record Number: [id number] Patient Account Number: [id number] Date of Birth/Sex: October 08, 1961 (61 y.o. F) Treating RN: Huel Coventry Primary Care Zoella Roberti: [name] Other Clinician: Referring Phillippa Straub: Card, John Treating Romain Erion/Extender: Rowan Blase in Treatment: 11 Encounter Discharge Information Items Discharge Condition: Stable Ambulatory Status: Walker Discharge Destination: Home Transportation: Private Auto Schedule Follow-up Appointment: Yes Clinical Summary of Care: Electronic Signature(s) Signed: 01/04/2022 5:16:08 PM By:  Elliot Gurney, BSN, RN, CWS, Kim RN, BSN Entered By: Elliot Gurney, BSN, RN, CWS, Kim on 01/03/2022 15:39:31 [name], [name] ([id number]) -------------------------------------------------------------------------------- Wound Assessment Details Patient Name: [name], [name] Date of Service: 01/03/2022 3:30 PM Medical Record Number: [id number] Patient Account Number: [id number] Date of Birth/Sex: 1961-03-24 (61 y.o. F) Treating RN: Huel Coventry Primary Care Marguerite Barba: [name] Other Clinician: Referring Dailah Opperman: Card, John Treating Bryceton Hantz/Extender: Rowan Blase in Treatment: 11 Wound Status Wound Number: 1 Primary Etiology: Dehisced Wound Wound Location: Distal, Midline Back Wound Status: Open Wounding Event: Surgical Injury Comorbid History: Hypertension Date Acquired: 04/11/2021 Weeks Of Treatment: 11 Clustered Wound: No Wound Measurements Length: (cm) 0.4 Width: (cm) 0.3 Depth: (cm) 3.2 Area: (cm) 0.094 Volume: (cm) 0.302 % Reduction in Area: 25.4% % Reduction in Volume: 47.8% Epithelialization: None Wound Description Classification: Full Thickness Without Exposed Support Structu Exudate Amount: Large Exudate Type: Serous Exudate Color: amber res Foul Odor After Cleansing: No Slough/Fibrino No Wound Bed Granulation Amount: Large (67-100%) Exposed Structure Granulation Quality: Red Fascia Exposed: No Necrotic Amount: None Present (0%) Fat Layer (Subcutaneous Tissue) Exposed: Yes Tendon Exposed: No Muscle Exposed: No Joint Exposed: No Bone Exposed: No Assessment Notes Due to wound size, unable to visualize wound bed. Treatment Notes Wound #1 (Back) Wound Laterality: Midline, Distal Cleanser Normal Saline Discharge Instruction: Wash your hands with soap and water. Remove old dressing, discard into plastic bag and place into trash. Cleanse the wound with Normal Saline prior to applying a clean dressing using gauze sponges, not tissues or cotton balls. Do not scrub or  use excessive force. Pat dry using gauze sponges, not tissue or cotton balls. Peri-Wound Care Topical Primary Dressing Hydrofera Blue Classic Foam Rope Dressing, 9x6 (mm/in) Discharge Instruction: cut rope in 1/4 lengthwise AND LEAVE A TAIL OUT TO GRAB IT Secondary Dressing Zetuvit Plus Silicone Border Dressing 4x4 (in/in) Secured With [name], [name] ([id number]) Compression Wrap Compression Stockings  Add-Ons Electronic Signature(s) Signed: 01/04/2022 5:16:08 PM By: Elliot Gurney, BSN, RN, CWS, Kim RN, BSN Entered By: Elliot Gurney, BSN, RN, CWS, Kim on 01/03/2022 15:38:50

## 2022-01-08 ENCOUNTER — Other Ambulatory Visit: Payer: Self-pay

## 2022-01-08 ENCOUNTER — Encounter: Payer: 59 | Admitting: Physician Assistant

## 2022-01-08 DIAGNOSIS — T8131XA Disruption of external operation (surgical) wound, not elsewhere classified, initial encounter: Secondary | ICD-10-CM | POA: Diagnosis not present

## 2022-01-08 NOTE — Progress Notes (Addendum)
BRIEANN, OSINSKI ([id number]) Visit Report for 01/08/2022 Chief Complaint Document Details Patient Name: [name], [name] Date of Service: 01/08/2022 12:30 PM Medical Record Number: [id number] Patient Account Number: [id number] Date of Birth/Sex: 1961/05/27 (61 y.o. F) Treating RN: Yevonne Pax Primary Care Provider: [name] Other Clinician: Referring Provider: Card, John Treating Provider/Extender: Rowan Blase in Treatment: 12 Information Obtained from: Patient Chief Complaint Surgical Back Ulcer Electronic Signature(s) Signed: 01/08/2022 12:58:22 PM By: [name] PA-C Entered By: [name] on 01/08/2022 12:58:22 Dutta, Juley CMarland Kitchen ([id number]) -------------------------------------------------------------------------------- HPI Details Patient Name: [name], [name] Date of Service: 01/08/2022 12:30 PM Medical Record Number: [id number] Patient Account Number: [id number] Date of Birth/Sex: 12/15/1961 (61 y.o. F) Treating RN: Yevonne Pax Primary Care Provider: [name] Other Clinician: Referring Provider: Card, John Treating Provider/Extender: Rowan Blase in Treatment: 12 History of Present Illness HPI Description: 10/15/2021 upon evaluation today patient presents for initial evaluation here in the clinic concerning a surgical ulceration/dehiscence in the lumbar spine region following surgery that she had over the past year. This was actually broken up into 3 separate surgical events. The initial surgical intervention actually was on November 05, 2021 almost a year ago. Subsequently the patient went back in February for a seroma of the area which unfortunately required her to have a repeat surgery to go in and clean this out. And then again this occurred in April where she went back in and again they felt like stitches were coming out and there was an additional seroma. She was placed in a wound VAC initially and then subsequently as it got smaller that  was discontinued. Again right now I will see anything that I think a wound VAC would help with. Nonetheless she definitely has a significant depth to the wound that is going require packing. I actually believe the Hydrofera Blue rope would probably do quite well with this the problem is as much as it is draining she probably needs this to be changed at least every day. She does not really have anyone that can help with that that is the complicating scenario here. With that being said the patient does have a history of multiple sclerosis, hypertension, and again this surgical wound dehiscence in regard to her lumbar spine region. She did have a repeat MRI which was actually completed 10/09/2021. This showed that there was no significant change in the subcutaneous fluid collection/track of the lower lumbar region. This is extending to the level of the fascia unfortunately. This seems to go all the way from the L2 level with a track extending all the way to the fascia at the L4-5 level. Again this is a significant wound and there is significant drainage but does not seem to communicate to the spinal region as far as spinal fluid or otherwise is concerned that is good news. Nonetheless she last saw Dr. Franky Macho who is her neurosurgeon on 10/01/2021 that was when he ordered this last MRI she supposed to see him next week as well. With that being said he did not feel like there was any significant issue there but was not sure why this was not healing that is when he ordered the MRI. They were wanting to make sure that this was packed appropriately by home health unfortunately the main issue currently is that home health is completely out of the picture as the patient has exhausted all the home health that that she gets for a year. She is now in a very difficult  predicament where she does not have anyone to help her change the dressing and to be honest that she is not able to do it herself with the location of  the wound being on the midline lumbar spine region. If she does not have anyone that can help it is probably can to be necessary for her to go to a facility for rehab and daily dressing changes as I feel like daily changes which is much drainage that she is having is going to be necessary. 10/23/2021 upon evaluation today patient appears to be doing decently well in regard to her back ulcer. This does seem to be draining a lot less than what it is been doing in the past. With that being said she still has quite a bit of drainage nonetheless. I do think that given time this should improve least I hope so. The good news is she does have home health coming out 3 days a week were doing it 2 days a week and she is paying someone we can to help. 10/30/2021 upon evaluation today patient appears to be doing okay in regard to her back ulcer this is not draining quite as bad as it was in the beginning but he still has quite a bit of drainage noted. I do believe that the patient would benefit from [place] going ahead forward with attempting a wound VAC using the Hydrofera Blue rope to pack with and then subsequently using the VAC externally to actually suction out and help this to fill- in. I think this is our ideal way to try to get things cleared at this point. As it stands I am not certain that we are really making a progress that we want to see near with doing it in the way we are which is packing with the rope. It is a good dressing but I do think it is insufficient for total healing. She just seems to have too much in the way of drainage at this point unfortunately. 11/06/2021 upon evaluation today patient unfortunately continues to have issues with her back ongoing. The good news is her MRI that was repeated showed signs of the size of this area in the lumbar spine region having decreased from 4 cm to 3.5 cm this is definitely not bad news at all. With that being said unfortunately she continues to have issues  with ongoing drainage not as severe as in the beginning but nonetheless still significant. I do think a wound VAC still would be a good way to go although her home health agency nurse apparently has some concerns about the possibility of not being able to keep a seal with this as they had struggles in the past. Nonetheless I explained to the patient that this is much different than what she had previous and that I really feel like it would do much better as far as getting the area taken care of without having any complications or issues here. I think that we should be able to maintain a seal. Nonetheless at this time I did discuss with the patient as well that she probably does need to have a wound VAC in order for [place] to get this moving in the right direction. 11/13/2021 upon evaluation patient's wound bed actually showed signs of significant drainage at this time. She did see the surgeon yesterday he did not see anything that appeared to be infected. Nonetheless he does appear that she is continuing to have areas here that just do not seem  to want to seal up there MRI findings have been negative but nonetheless she continues to have is the seroma that is filling in. I do feel like we need to try to widen the hole so we can get at least a half of the [facility] then this will be better than nothing at this point. 11/20/2021 upon evaluation today patient actually appears to be having less pain at this point which is good news and overall she we still do not have the results of the culture back yet it had to be sent out to [facility] and we do not have the result back yet. Is doing decently well in regard to her wound. Fortunately there does not appear to be any signs of active infection systemically nor locally at this time. 11/27/2021 upon evaluation today patient appears to be doing well with regard to her wound all things considered there does appear to be less drainage than there was previous.  Fortunately I do not see any evidence of worsening of the patient is stating that she is having some issues with back pain. This is somewhat new. Again this I think could be related to the fact that she is having some issues here with infection. We are still waiting to see what the result of her culture shows from susceptibility testing Enterococcus has been identified but we do not know if this is VRE or not. 12/04/2021 upon evaluation today patient appears to be doing well with regard to her wound all things considered. I did have a conversation with Erin from Dr. Lacy Duverney office. Of note she notes that Dr. Joetta Manners really does not want to do anything surgical right now which I completely understand. With that being said I am still leery of how far we will make it getting this to heal short of any type of surgery to open this up and allow [place] to more appropriately packed the wound. Nonetheless I will absolutely give it our best shot as far as that is concerned. I discussed that with the patient today. She voiced understanding. The good news is the drainage today seems to be clear it is no longer cloudy as it was previous I am actually very pleased in that regard. NATILIE, DIEHR ([id number]) 12/11/2021 since have last seen the patient actually did have an conversation with Dr. Christella Noa with her neurosurgeon. Again this involve the discussion around whether or not to open the wound and try to apply a wound VAC following. With that being said the decision was made that that may be the best thing to do if the patient was in agreement. Nonetheless I am actually extremely encouraged with what I am seeing today much more than I would have thought. In fact I think that we may be over packing the wound which is why she is having some discomfort at this point as there is much less of the dressing able to get and this time compared to what we saw previous. That is actually really good news. In fact the 6:00  tunnel appears to have filled in is awesome news. At the 12:00 location I am actually able to pack into that area pretty effectively at this point today. In fact I was able to get less of the packing in which I will detail below. 12/18/2021 upon evaluation today patient appears to be doing well with regard to her wound on the back. Fortunately there is no signs of active infection which is great news and overall  very pleased with where things stand today. No fevers, chills, nausea, vomiting, or diarrhea. 01/01/2022 upon evaluation today patient appears to be doing decently well in regard to her wound. Fortunately there does not appear to be any signs of anything worsening which is great news. She still continues to have issues with drainage but this is not nearly as significant as what it was in the past. There is all clear drainage no signs of purulence noted. 01/08/2022 upon evaluation today patient appears to be doing well with regard to her wound. With that being said she tells me that she has been having some increased pain over the past week. It does appear based on the amount of Hydrofera Blue that we removed this is probably over pack. Again it is difficult to know exactly how old the nurses getting all the skin but nonetheless the length of Hydrofera Blue that was utilized was way too much which may be part of the reason why this felt so uncomfortable. Fortunately I think that we can cut back on that we discussed pieces smaller so hopefully that will not be over packed. Electronic Signature(s) Signed: 01/08/2022 1:11:13 PM By: [name] PA-C Entered By: [name] on 01/08/2022 13:11:13 Reinhold, Charmane Salena Saner ([id number]) -------------------------------------------------------------------------------- Physical Exam Details Patient Name: [name], [name] Date of Service: 01/08/2022 12:30 PM Medical Record Number: [id number] Patient Account Number: [id number] Date of Birth/Sex:  Aug 16, 1961 (61 y.o. F) Treating RN: Yevonne Pax Primary Care Provider: [name] Other Clinician: Referring Provider: Card, John Treating Provider/Extender: Rowan Blase in Treatment: 12 Constitutional Well-nourished and well-hydrated in no acute distress. Respiratory normal breathing without difficulty. Psychiatric this patient is able to make decisions and demonstrates good insight into disease process. Alert and Oriented x 3. pleasant and cooperative. Notes Upon inspection patient's wound still appears to be open and it is draining nicely the [facility] seems to be keeping it clear which is great news as well. Fortunately I do not see any signs of infection at this time which is great news. Electronic Signature(s) Signed: 01/08/2022 1:11:28 PM By: [name] PA-C Entered By: [name] on 01/08/2022 13:11:27 Chuong, Domingo Pulse ([id number]) -------------------------------------------------------------------------------- Physician Orders Details Patient Name: Chapdelaine, Marelly C. Date of Service: 01/08/2022 12:30 PM Medical Record Number: [id number] Patient Account Number: [id number] Date of Birth/Sex: 1961/12/30 (61 y.o. 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 o Nurse Visit as needed - nurse visit on tuesday and thursday Home Health o Home Health Company: [facility] o [facility] for wound care. May utilize formulary equivalent dressing for wound treatment  orders unless otherwise specified. Home Health Nurse may visit PRN to address patientos wound care needs. - 3 times per week [facility] fax [phone] Bathing/ Shower/ Hygiene o May shower; gently cleanse wound with antibacterial soap, rinse and pat dry prior to dressing wounds o No tub bath. Anesthetic (Use 'Patient Medications' Section for Anesthetic Order Entry) o Lidocaine applied to wound bed Edema Control - Lymphedema / Segmental Compressive Device / Other o Elevate, Exercise Daily and Avoid Standing for Long Periods of Time. o Elevate legs  to the level of the heart and pump ankles as often as possible o Elevate leg(s) parallel to the floor when sitting. Additional Orders / Instructions o Follow Nutritious Diet and Increase Protein Intake Wound Treatment Wound #1 - Back Wound Laterality: Midline, Distal Cleanser: Normal Saline 1 x Per Day/30 Days Discharge Instructions: Wash your hands with soap and water. Remove old dressing, discard into plastic bag and place into trash. Cleanse the wound with Normal Saline prior to applying a clean dressing using gauze sponges, not tissues or cotton balls. Do not scrub or use excessive force. Pat dry using gauze sponges, not tissue or cotton balls. Primary Dressing: Hydrofera Blue Classic Foam Rope Dressing, 9x6 (mm/in) 1 x Per Day/30 Days Discharge Instructions: cut rope in 1/4 lengthwise AND LEAVE A TAIL OUT TO GRAB IT Secondary Dressing: Zetuvit Plus Silicone Border Dressing 4x4 (in/in) 1 x Per Day/30 Days Electronic Signature(s) Signed: 01/08/2022 4:15:58 PM By: [name] PA-C Signed: 01/09/2022 6:47:24 PM By: Yevonne Pax RN Entered By: Yevonne Pax on 01/08/2022 13:10:30 Wareing, Thanya C. ([id number]) -------------------------------------------------------------------------------- Problem List Details Patient Name: [name], [name] Date of Service: 01/08/2022 12:30 PM Medical Record Number: [id number] Patient Account  Number: [id number] Date of Birth/Sex: 09-Mar-1961 (61 y.o. 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 ([id number]) -------------------------------------------------------------------------------- Progress Note Details Patient Name: Drennen, Javanna C. Date of Service: 01/08/2022 12:30 PM Medical Record Number: [id number] Patient Account Number: [id number] Date of Birth/Sex: 06-27-1961 (61 y.o. F) Treating RN: Yevonne Pax Primary Care Provider: [name] Other Clinician: Referring Provider: Card, John Treating Provider/Extender: Rowan Blase in Treatment: 12 Subjective Chief Complaint Information obtained from Patient Surgical Back Ulcer History of Present Illness (HPI) 10/15/2021 upon evaluation today patient presents for initial evaluation here in the clinic concerning a surgical ulceration/dehiscence in the lumbar spine region following surgery that she had over the past year. This was actually broken up into 3 separate surgical events. The initial surgical intervention actually was on November 05, 2021 almost a year ago. Subsequently the patient went back in February for a seroma of the area which unfortunately required her to have a repeat surgery to go in and clean this out. And then again  this occurred in April where she went back in and again they felt like stitches were coming out and there was an additional seroma. She was placed in a wound VAC initially and then subsequently as it got smaller that was discontinued. Again right now I will see anything that I think a wound VAC would help with. Nonetheless she definitely has a significant depth to the wound that is going require packing. I actually believe the Hydrofera Blue rope would probably do quite well with this the problem is as much as it is draining she probably needs this to be changed at least every day. She does not really have anyone that can help with that that is the complicating scenario here. With that being said the  patient does have a history of multiple sclerosis, hypertension, and again this surgical wound dehiscence in regard to her lumbar spine region. She did have a repeat MRI which was actually completed 10/09/2021. This showed that there was no significant change in the subcutaneous fluid collection/track of the lower lumbar region. This is extending to the level of the fascia unfortunately. This seems to go all the way from the L2 level with a track extending all the way to the fascia at the L4-5 level. Again this is a significant wound and there is significant drainage but does not seem to communicate to the spinal region as far as spinal fluid or otherwise is concerned that is good news. Nonetheless she last saw Dr. Franky Macho who is her neurosurgeon on 10/01/2021 that was when he ordered this last MRI she supposed to see him next week as well. With that being said he did not feel like there was any significant issue there but was not sure why this was not healing that is when he ordered the MRI. They were wanting to make sure that this was packed appropriately by home health unfortunately the main issue currently is that home health is completely out of the picture as the patient has exhausted all the home  health that that she gets for a year. She is now in a very difficult predicament where she does not have anyone to help her change the dressing and to be honest that she is not able to do it herself with the location of the wound being on the midline lumbar spine region. If she does not have anyone that can help it is probably can to be necessary for her to go to a facility for rehab and daily dressing changes as I feel like daily changes which is much drainage that she is having is going to be necessary. 10/23/2021 upon evaluation today patient appears to be doing decently well in regard to her back ulcer. This does seem to be draining a lot less than what it is been doing in the past. With that being said she still has quite a bit of drainage nonetheless. I do think that given time this should improve least I hope so. The good news is she does have home health coming out 3 days a week were doing it 2 days a week and she is paying someone we can to help. 10/30/2021 upon evaluation today patient appears to be doing okay in regard to her back ulcer this is not draining quite as bad as it was in the beginning but he still has quite a bit of drainage noted. I do believe that the patient would benefit from [place] going ahead forward with attempting a wound VAC using the Hydrofera Blue rope to pack with and then subsequently using the VAC externally to actually suction out and help this to fill- in. I think this is our ideal way to try to get things cleared at this point. As it stands I am not certain that we are really making a progress that we want to see near with doing it in the way we are which is packing with the rope. It is a good dressing but I do think it is insufficient for total healing. She just seems to have too much in the way of drainage at this point unfortunately. 11/06/2021 upon evaluation today patient unfortunately continues to have issues with her back ongoing. The good news is her MRI that  was repeated showed  signs of the size of this area in the lumbar spine region having decreased from 4 cm to 3.5 cm this is definitely not bad news at all. With that being said unfortunately she continues to have issues with ongoing drainage not as severe as in the beginning but nonetheless still significant. I do think a wound VAC still would be a good way to go although her home health agency nurse apparently has some concerns about the possibility of not being able to keep a seal with this as they had struggles in the past. Nonetheless I explained to the patient that this is much different than what she had previous and that I really feel like it would do much better as far as getting the area taken care of without having any complications or issues here. I think that we should be able to maintain a seal. Nonetheless at this time I did discuss with the patient as well that she probably does need to have a wound VAC in order for [place] to get this moving in the right direction. 11/13/2021 upon evaluation patient's wound bed actually showed signs of significant drainage at this time. She did see the surgeon yesterday he did not see anything that appeared to be infected. Nonetheless he does appear that she is continuing to have areas here that just do not seem to want to seal up there MRI findings have been negative but nonetheless she continues to have is the seroma that is filling in. I do feel like we need to try to widen the hole so we can get at least a half of the [facility] then this will be better than nothing at this point. 11/20/2021 upon evaluation today patient actually appears to be having less pain at this point which is good news and overall she we still do not have the results of the culture back yet it had to be sent out to [facility] and we do not have the result back yet. Is doing decently well in regard to her wound. Fortunately there does not appear to be any signs of active infection  systemically nor locally at this time. 11/27/2021 upon evaluation today patient appears to be doing well with regard to her wound all things considered there does appear to be less drainage than there was previous. Fortunately I do not see any evidence of worsening of the patient is stating that she is having some issues with back pain. This is somewhat new. Again this I think could be related to the fact that she is having some issues here with infection. We are still waiting to see what the result of her culture shows from susceptibility testing Enterococcus has been identified but we do not know if this is VRE or not. 12/04/2021 upon evaluation today patient appears to be doing well with regard to her wound all things considered. I did have a conversation with Erin from Dr. Sueanne Margarita office. Of note she notes that Dr. Drinda Butts really does not want to do anything surgical right now which I completely Goracke, Raigen C. ([id number]) understand. With that being said I am still leery of how far we will make it getting this to heal short of any type of surgery to open this up and allow [place] to more appropriately packed the wound. Nonetheless I will absolutely give it our best shot as far as that is concerned. I discussed that with the patient today. She voiced understanding. The good news is the drainage today seems  to be clear it is no longer cloudy as it was previous I am actually very pleased in that regard. 12/11/2021 since have last seen the patient actually did have an conversation with Dr. Franky Macho with her neurosurgeon. Again this involve the discussion around whether or not to open the wound and try to apply a wound VAC following. With that being said the decision was made that that may be the best thing to do if the patient was in agreement. Nonetheless I am actually extremely encouraged with what I am seeing today much more than I would have thought. In fact I think that we may be over packing  the wound which is why she is having some discomfort at this point as there is much less of the dressing able to get and this time compared to what we saw previous. That is actually really good news. In fact the 6:00 tunnel appears to have filled in is awesome news. At the 12:00 location I am actually able to pack into that area pretty effectively at this point today. In fact I was able to get less of the packing in which I will detail below. 12/18/2021 upon evaluation today patient appears to be doing well with regard to her wound on the back. Fortunately there is no signs of active infection which is great news and overall very pleased with where things stand today. No fevers, chills, nausea, vomiting, or diarrhea. 01/01/2022 upon evaluation today patient appears to be doing decently well in regard to her wound. Fortunately there does not appear to be any signs of anything worsening which is great news. She still continues to have issues with drainage but this is not nearly as significant as what it was in the past. There is all clear drainage no signs of purulence noted. 01/08/2022 upon evaluation today patient appears to be doing well with regard to her wound. With that being said she tells me that she has been having some increased pain over the past week. It does appear based on the amount of Hydrofera Blue that we removed this is probably over pack. Again it is difficult to know exactly how old the nurses getting all the skin but nonetheless the length of Hydrofera Blue that was utilized was way too much which may be part of the reason why this felt so uncomfortable. Fortunately I think that we can cut back on that we discussed pieces smaller so hopefully that will not be over packed. Objective Constitutional Well-nourished and well-hydrated in no acute distress. Vitals Time Taken: 12:43 PM, Weight: 275 lbs, Temperature: 98.2 F, Pulse: 75 bpm, Respiratory Rate: 18 breaths/min, Blood Pressure:  152/91 mmHg. Respiratory normal breathing without difficulty. Psychiatric this patient is able to make decisions and demonstrates good insight into disease process. Alert and Oriented x 3. pleasant and cooperative. General Notes: Upon inspection patient's wound still appears to be open and it is draining nicely the Hydrofera Blue seems to be keeping it clear which is great news as well. Fortunately I do not see any signs of infection at this time which is great news. Integumentary (Hair, Skin) Wound #1 status is Open. Original cause of wound was Surgical Injury. The date acquired was: 04/11/2021. The wound has been in treatment 12 weeks. The wound is located on the Distal,Midline Back. The wound measures 0.3cm length x 0.3cm width x 2.8cm depth; 0.071cm^2 area and 0.198cm^3 volume. There is Fat Layer (Subcutaneous Tissue) exposed. There is no tunneling or undermining noted. There is  a large amount of serous drainage noted. There is large (67-100%) red granulation within the wound bed. There is no necrotic tissue within the wound bed. 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 ([id number]) Plan Follow-up Appointments: Return Appointment in 1 week. Nurse Visit as needed - nurse visit on tuesday and thursday Home Health: Home Health Company: - [facility] for wound care. May utilize formulary equivalent dressing for wound treatment orders unless otherwise specified. Home Health Nurse may visit PRN to address patient s wound care needs. - 3 times per week [facility] fax [phone] Bathing/ Shower/ Hygiene: May shower; gently cleanse wound with antibacterial soap, rinse and pat dry prior to dressing wounds No tub bath. Anesthetic (Use 'Patient Medications' Section  for Anesthetic Order Entry): Lidocaine applied to wound bed Edema Control - Lymphedema / Segmental Compressive Device / Other: Elevate, Exercise Daily and Avoid Standing for Long Periods of Time. Elevate legs to the level of the heart and pump ankles as often as possible Elevate leg(s) parallel to the floor when sitting. Additional Orders / Instructions: Follow Nutritious Diet and Increase Protein Intake WOUND #1: - Back Wound Laterality: Midline, Distal Cleanser: Normal Saline 1 x Per Day/30 Days Discharge Instructions: Wash your hands with soap and water. Remove old dressing, discard into plastic bag and place into trash. Cleanse the wound with Normal Saline prior to applying a clean dressing using gauze sponges, not tissues or cotton balls. Do not scrub or use excessive force. Pat dry using gauze sponges, not tissue or cotton balls. Primary Dressing: Hydrofera Blue Classic Foam Rope Dressing, 9x6 (mm/in) 1 x Per Day/30 Days Discharge Instructions: cut rope in 1/4 lengthwise AND LEAVE A TAIL OUT TO GRAB IT Secondary Dressing: Zetuvit Plus Silicone Border Dressing 4x4 (in/in) 1 x Per Day/30 Days 1. Would recommend currently that we going to continue with the wound care measures as before and the patient is in agreement with the plan. We will use of the Hydrofera Blue rope to pack of the area currently. The biggest issue that I see is that we need to make sure not to over pack the wounds. 2. I am also can recommend at this time that we have the patient continue to monitor for any signs of worsening infection. Obviously if anything changes she should let me know but right now I do not see any evidence that there is any infection at all at this point. We will see patient back for reevaluation in 1 week here in the clinic. If anything worsens or changes patient will contact our office for additional recommendations. Electronic Signature(s) Signed: 01/08/2022 1:17:30 PM By: [name]  PA-C Entered By: [name] on 01/08/2022 13:17:29 Duffy, Valary Salena Saner ([id number]) -------------------------------------------------------------------------------- SuperBill Details Patient Name: [name], [name] Date of Service: 01/08/2022 Medical Record Number: [id number] Patient Account Number: [id number] Date of Birth/Sex: 1961/07/01 (61 y.o. 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 G35 Multiple sclerosis I10 Essential (primary) hypertension Facility Procedures CPT4 Code: [id number] Description: 99213 - WOUND CARE VISIT-LEV 3 EST PT Modifier: Quantity: 1 Physician Procedures CPT4 Code: 2440102  Description: 99214 - WC PHYS LEVEL 4 - EST PT Modifier: Quantity: 1 CPT4 Code: Description: ICD-10 Diagnosis Description T81.31XA Disruption of external operation (surgical) wound, not elsewhere classifi 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

## 2022-01-10 ENCOUNTER — Other Ambulatory Visit: Payer: Self-pay

## 2022-01-10 DIAGNOSIS — T8131XA Disruption of external operation (surgical) wound, not elsewhere classified, initial encounter: Secondary | ICD-10-CM | POA: Diagnosis not present

## 2022-01-10 NOTE — Progress Notes (Signed)
GENNESIS, CIOTTI ([id number]) Visit Report for 01/08/2022 Arrival Information Details Patient Name: [name], [name] Date of Service: 01/08/2022 12:30 PM Medical Record Number: [id number] Patient Account Number: [id number] Date of Birth/Sex: 1961-05-13 (61 y.o. F) Treating RN: Carlene Coria Primary Care [place]: Card, Jenny Reichmann Other Clinician: Referring Isiaah Cuervo: Card, John Treating Necia Kamm/Extender: Skipper Cliche in Treatment: 12 Visit Information History Since Last Visit All ordered tests and consults were completed: No Patient Arrived: [name] Added or deleted any medications: No Arrival Time: 12:39 Any new allergies or adverse reactions: No Accompanied By: self Had a fall or experienced change in No Transfer Assistance: None activities of daily living that may affect Patient Identification Verified: Yes risk of falls: Secondary Verification Process Completed: Yes Signs or symptoms of abuse/neglect since last visito No Patient Requires Transmission-Based Precautions: No Hospitalized since last visit: No Patient Has Alerts: No Implantable device outside of the clinic excluding No cellular tissue based products placed in the center since last visit: Has Dressing in Place as Prescribed: Yes Pain Present Now: No Electronic Signature(s) Signed: 01/09/2022 6:47:24 PM By: Carlene Coria RN Entered By: Carlene Coria on 01/08/2022 12:43:31 [name], [name] ([id number]) -------------------------------------------------------------------------------- Clinic Level of Care Assessment Details Patient Name: Whichard, Inice C. Date of Service: 01/08/2022 12:30 PM Medical Record Number: [id number] Patient Account Number: [id number] Date of Birth/Sex: February 28, 1961 (61 y.o. F) Treating RN: Carlene Coria Primary Care Jaidan Stachnik: Card, Jenny Reichmann Other Clinician: Referring Olamae Ferrara: Card, John Treating Marcoantonio Legault/Extender: Skipper Cliche in Treatment: 12 Clinic Level of Care Assessment Items TOOL  4 Quantity Score X - Use when only an EandM is performed on FOLLOW-UP visit 1 0 ASSESSMENTS - Nursing Assessment / Reassessment X - Reassessment of Co-morbidities (includes updates in patient status) 1 10 X- 1 5 Reassessment of Adherence to Treatment Plan ASSESSMENTS - Wound and Skin Assessment / Reassessment X - Simple Wound Assessment / Reassessment - one wound 1 5 []  - 0 Complex Wound Assessment / Reassessment - multiple wounds []  - 0 Dermatologic / Skin Assessment (not related to wound area) ASSESSMENTS - Focused Assessment []  - Circumferential Edema Measurements - multi extremities 0 []  - 0 Nutritional Assessment / Counseling / Intervention []  - 0 Lower Extremity Assessment (monofilament, tuning fork, pulses) []  - 0 Peripheral Arterial Disease Assessment (using hand held doppler) ASSESSMENTS - Ostomy and/or Continence Assessment and Care []  - Incontinence Assessment and Management 0 []  - 0 Ostomy Care Assessment and Management (repouching, etc.) PROCESS - Coordination of Care X - Simple Patient / Family Education for ongoing care 1 15 []  - 0 Complex (extensive) Patient / Family Education for ongoing care []  - 0 Staff obtains Programmer, systems, Records, Test Results / Process Orders []  - 0 Staff telephones HHA, Nursing Homes / Clarify orders / etc []  - 0 Routine Transfer to another Facility (non-emergent condition) []  - 0 Routine Hospital Admission (non-emergent condition) []  - 0 New Admissions / Biomedical engineer / Ordering NPWT, Apligraf, etc. []  - 0 Emergency Hospital Admission (emergent condition) X- 1 10 Simple Discharge Coordination []  - 0 Complex (extensive) Discharge Coordination PROCESS - Special Needs []  - Pediatric / Minor Patient Management 0 []  - 0 Isolation Patient Management []  - 0 Hearing / Language / Visual special needs []  - 0 Assessment of Community assistance (transportation, D/C planning, etc.) []  - 0 Additional assistance / Altered  mentation []  - 0 Support Surface(s) Assessment (bed, cushion, seat, etc.) INTERVENTIONS - Wound Cleansing / Measurement [name], [name] ([id number]) X- 1 5 Simple Wound Cleansing -  one wound []  - 0 Complex Wound Cleansing - multiple wounds X- 1 5 Wound Imaging (photographs - any number of wounds) []  - 0 Wound Tracing (instead of photographs) X- 1 5 Simple Wound Measurement - one wound []  - 0 Complex Wound Measurement - multiple wounds INTERVENTIONS - Wound Dressings []  - Small Wound Dressing one or multiple wounds 0 X- 1 15 Medium Wound Dressing one or multiple wounds []  - 0 Large Wound Dressing one or multiple wounds []  - 0 Application of Medications - topical []  - 0 Application of Medications - injection INTERVENTIONS - Miscellaneous []  - External ear exam 0 []  - 0 Specimen Collection (cultures, biopsies, blood, body fluids, etc.) []  - 0 Specimen(s) / Culture(s) sent or taken to Lab for analysis []  - 0 Patient Transfer (multiple staff / Civil Service fast streamer / Similar devices) []  - 0 Simple Staple / Suture removal (25 or less) []  - 0 Complex Staple / Suture removal (26 or more) []  - 0 Hypo / Hyperglycemic Management (close monitor of Blood Glucose) []  - 0 Ankle / Brachial Index (ABI) - do not check if billed separately X- 1 5 Vital Signs Has the patient been seen at the hospital within the last three years: Yes Total Score: 80 Level Of Care: New/Established - Level 3 Electronic Signature(s) Signed: 01/09/2022 6:47:24 PM By: Carlene Coria RN Entered By: Carlene Coria on 01/08/2022 13:11:15 [name], [name] Kitchen ([id number]) -------------------------------------------------------------------------------- Encounter Discharge Information Details Patient Name: [name], [name] Date of Service: 01/08/2022 12:30 PM Medical Record Number: [id number] Patient Account Number: [id number] Date of Birth/Sex: [birth year] (61 y.o. F) Treating RN: Carlene Coria Primary Care Genese Quebedeaux:  [name] Other Clinician: Referring [name]: Card, John Treating Argie Applegate/Extender: Skipper Cliche in Treatment: 12 Encounter Discharge Information Items Discharge Condition: Stable Ambulatory Status: Ambulatory Discharge Destination: Home Transportation: Private Auto Accompanied By: self Schedule Follow-up Appointment: Yes Clinical Summary of Care: Patient Declined Electronic Signature(s) Signed: 01/09/2022 6:47:24 PM By: Carlene Coria RN Entered By: Carlene Coria on 01/08/2022 13:12:30 Beyene, Naseem C. ([id number]) -------------------------------------------------------------------------------- Lower Extremity Assessment Details Patient Name: [name], [name] Date of Service: 01/08/2022 12:30 PM Medical Record Number: [id number] Patient Account Number: [id number] Date of Birth/Sex: [birth year] (61 y.o. F) Treating RN: Carlene Coria Primary Care Shalunda Lindh: [name] Other Clinician: Referring Ethin Drummond: Card, John Treating Khalia Gong/Extender: Skipper Cliche in Treatment: 12 Electronic Signature(s) Signed: 01/09/2022 6:47:24 PM By: Carlene Coria RN Entered By: Carlene Coria on 01/08/2022 12:50:07 Spira, Harlei CMarland Kitchen ([id number]) -------------------------------------------------------------------------------- Multi Wound Chart Details Patient Name: [name], [name] Date of Service: 01/08/2022 12:30 PM Medical Record Number: [id number] Patient Account Number: [id number] Date of Birth/Sex: 10-29-1961 (61 y.o. F) Treating RN: Carlene Coria Primary Care Saed Hudlow: [name] Other Clinician: Referring Lilie Vezina: Card, John Treating Haizley Cannella/Extender: Skipper Cliche in Treatment: 12 Vital Signs Height(in): Pulse(bpm): 69 Weight(lbs): 275 Blood Pressure(mmHg): 152/91 Body Mass Index(BMI): Temperature(F): 98.2 Respiratory Rate(breaths/min): 18 Photos: [N/A:N/A] Wound Location: Distal, Midline Back N/A N/A Wounding Event: Surgical Injury N/A N/A Primary Etiology:  Dehisced Wound N/A N/A Comorbid History: Hypertension N/A N/A Date Acquired: 04/11/2021 N/A N/A Weeks of Treatment: 12 N/A N/A Wound Status: Open N/A N/A Measurements L x W x D (cm) 0.3x0.3x2.8 N/A N/A Area (cm) : 0.071 N/A N/A Volume (cm) : 0.198 N/A N/A % Reduction in Area: 43.70% N/A N/A % Reduction in Volume: 65.70% N/A N/A Classification: Full Thickness Without Exposed N/A N/A Support Structures Exudate Amount: Large N/A N/A Exudate Type: Serous N/A N/A Exudate Color: amber N/A N/A Granulation Amount:  Large (67-100%) N/A N/A Granulation Quality: Red N/A N/A Necrotic Amount: None Present (0%) N/A N/A Exposed Structures: Fat Layer (Subcutaneous Tissue): N/A N/A Yes Fascia: No Tendon: No Muscle: No Joint: No Bone: No Epithelialization: None N/A N/A Treatment Notes Electronic Signature(s) Signed: 01/09/2022 6:47:24 PM By: Carlene Coria RN Entered By: Carlene Coria on 01/08/2022 13:09:35 Kilker, Josselyn Loletha Grayer ([id number]) -------------------------------------------------------------------------------- [place] Details Patient Name: [name], [name] Date of Service: 01/08/2022 12:30 PM Medical Record Number: [id number] Patient Account Number: [id number] Date of Birth/Sex: 02-Dec-1961 (61 y.o. F) Treating RN: Carlene Coria Primary Care Anwar Sakata: [name] Other Clinician: Referring Ruble Pumphrey: Card, John Treating Colbie Sliker/Extender: Skipper Cliche in Treatment: 12 Active Inactive Wound/Skin Impairment Nursing Diagnoses: Knowledge deficit related to ulceration/compromised skin integrity Goals: Patient/caregiver will verbalize understanding of skin care regimen Date Initiated: 10/15/2021 Target Resolution Date: 12/15/2021 Goal Status: Active Ulcer/skin breakdown will have a volume reduction of 30% by week 4 Date Initiated: 10/15/2021 Target Resolution Date: 12/15/2021 Goal Status: Active Ulcer/skin breakdown will have a volume reduction of 50% by week  8 Date Initiated: 10/15/2021 Target Resolution Date: 01/15/2022 Goal Status: Active Ulcer/skin breakdown will have a volume reduction of 80% by week 12 Date Initiated: 10/15/2021 Target Resolution Date: 02/15/2022 Goal Status: Active Ulcer/skin breakdown will heal within 14 weeks Date Initiated: 10/15/2021 Target Resolution Date: 03/15/2022 Goal Status: Active Interventions: Assess patient/caregiver ability to obtain necessary supplies Assess patient/caregiver ability to perform ulcer/skin care regimen upon admission and as needed Assess ulceration(s) every visit Notes: Electronic Signature(s) Signed: 01/09/2022 6:47:24 PM By: Carlene Coria RN Entered By: Carlene Coria on 01/08/2022 13:09:23 Postma, Francina C. ([id number]) -------------------------------------------------------------------------------- Pain Assessment Details Patient Name: [name], [name] Date of Service: 01/08/2022 12:30 PM Medical Record Number: [id number] Patient Account Number: [id number] Date of Birth/Sex: July 12, 1961 (61 y.o. F) Treating RN: Carlene Coria Primary Care Chapel Silverthorn: [name] Other Clinician: Referring Eula Jaster: Card, John Treating Pistol Kessenich/Extender: Skipper Cliche in Treatment: 12 Active Problems Location of Pain Severity and Description of Pain Patient Has Paino Yes Site Locations With Dressing Change: Yes Duration of the Pain. Constant / Intermittento Constant Rate the pain. Current Pain Level: 4 Worst Pain Level: 6 Least Pain Level: 2 Character of Pain Describe the Pain: Aching, Sharp Pain Management and Medication Current Pain Management: Medication: Yes Cold Application: No Rest: Yes Massage: No Activity: No T.E.N.S.: No Heat Application: No Leg drop or elevation: No Is the Current Pain Management Adequate: Inadequate How does your wound impact your activities of daily livingo Sleep: Yes Bathing: No Appetite: No Relationship With Others: No Bladder Continence:  No Emotions: No Bowel Continence: No Work: No Toileting: No Drive: No Dressing: No Hobbies: No Electronic Signature(s) Signed: 01/09/2022 6:47:24 PM By: Carlene Coria RN Entered By: Carlene Coria on 01/08/2022 12:44:51 Alfonzo, Hien Loletha Grayer ([id number]) -------------------------------------------------------------------------------- Patient/Caregiver Education Details Patient Name: [name], [name] Date of Service: 01/08/2022 12:30 PM Medical Record Number: [id number] Patient Account Number: [id number] Date of Birth/Gender: 04-22-1961 (61 y.o. F) Treating RN: Carlene Coria Primary Care Physician: [name] Other Clinician: Referring Physician: Card, John Treating Physician/Extender: Skipper Cliche in Treatment: 12 Education Assessment Education Provided To: Patient Education Topics Provided Wound/Skin Impairment: Methods: Explain/Verbal Responses: State content correctly Electronic Signature(s) Signed: 01/09/2022 6:47:24 PM By: Carlene Coria RN Entered By: Carlene Coria on 01/08/2022 13:11:30 [name], [name] ([id number]) -------------------------------------------------------------------------------- Wound Assessment Details Patient Name: [name], [name] Date of Service: 01/08/2022 12:30 PM Medical Record Number: [id number] Patient Account Number: [id number] Date of Birth/Sex: [birth year] (61 y.o. F) Treating  RN: Carlene Coria Primary Care Epic [place]: Card, Jenny Reichmann Other Clinician: Referring Mando Blatz: Card, John Treating Karine Garn/Extender: Skipper Cliche in Treatment: 12 Wound Status Wound Number: 1 Primary Etiology: Dehisced Wound Wound Location: Distal, Midline Back Wound Status: Open Wounding Event: Surgical Injury Comorbid History: Hypertension Date Acquired: 04/11/2021 Weeks Of Treatment: 12 Clustered Wound: No Photos Wound Measurements Length: (cm) 0.3 Width: (cm) 0.3 Depth: (cm) 2.8 Area: (cm) 0.071 Volume: (cm) 0.198 % Reduction in Area: 43.7% %  Reduction in Volume: 65.7% Epithelialization: None Tunneling: No Undermining: No Wound Description Classification: Full Thickness Without Exposed Support Structures Exudate Amount: Large Exudate Type: Serous Exudate Color: amber Foul Odor After Cleansing: No Slough/Fibrino No Wound Bed Granulation Amount: Large (67-100%) Exposed Structure Granulation Quality: Red Fascia Exposed: No Necrotic Amount: None Present (0%) Fat Layer (Subcutaneous Tissue) Exposed: Yes Tendon Exposed: No Muscle Exposed: No Joint Exposed: No Bone Exposed: No Treatment Notes Wound #1 (Back) Wound Laterality: Midline, Distal Cleanser Normal Saline Discharge Instruction: Wash your hands with soap and water. Remove old dressing, discard into plastic bag and place into trash. Cleanse the wound with Normal Saline prior to applying a clean dressing using gauze sponges, not tissues or cotton balls. Do not scrub or use excessive force. Pat dry using gauze sponges, not tissue or cotton balls. [name], [name] ([id number]) Peri-Wound Care Topical Primary Dressing Hydrofera Blue Classic Foam Rope Dressing, 9x6 (mm/in) Discharge Instruction: cut rope in 1/4 lengthwise AND LEAVE A TAIL OUT TO GRAB IT Secondary Dressing Zetuvit Plus Silicone Border Dressing 4x4 (in/in) Secured With Compression Wrap Compression Stockings Add-Ons Electronic Signature(s) Signed: 01/09/2022 6:47:24 PM By: Carlene Coria RN Entered By: Carlene Coria on 01/08/2022 12:49:41 [name], [name] ([id number]) -------------------------------------------------------------------------------- [place] Details Patient Name: Allcorn, [name] C. Date of Service: 01/08/2022 12:30 PM Medical Record Number: [id number] Patient Account Number: [id number] Date of Birth/Sex: Mar 29, 1961 (61 y.o. F) Treating RN: Carlene Coria Primary Care Bray Vickerman: [name] Other Clinician: Referring Vanice Rappa: Card, John Treating Roshawn Lacina/Extender: Skipper Cliche in  Treatment: 12 Vital Signs Time Taken: 12:43 Temperature (F): 98.2 Weight (lbs): 275 Pulse (bpm): 75 Respiratory Rate (breaths/min): 18 Blood Pressure (mmHg): 152/91 Reference Range: 80 - 120 mg / dl Electronic Signature(s) Signed: 01/09/2022 6:47:24 PM By: Carlene Coria RN Entered By: Carlene Coria on 01/08/2022 12:44:04

## 2022-01-11 NOTE — Progress Notes (Signed)
SHIVIKA, ENGELHARD ([id number]) Visit Report for 01/10/2022 Arrival Information Details Patient Name: Maus, EVEA STANCO. Date of Service: 01/10/2022 3:30 PM Medical Record Number: [id number] Patient Account Number: [id number] Date of Birth/Sex: 08-Jan-1961 (61 y.o. F) Treating RN: Yevonne Pax Primary Care Tayelor Osborne: Card, Jonny Ruiz Other Clinician: Referring Lawernce Earll: Card, John Treating Shelagh Rayman/Extender: Rowan Blase in Treatment: 12 Visit Information History Since Last Visit All ordered tests and consults were completed: No Patient Arrived: [name] Added or deleted any medications: No Arrival Time: 15:49 Any new allergies or adverse reactions: No Accompanied By: self Had a fall or experienced change in No Transfer Assistance: None activities of daily living that may affect Patient Identification Verified: Yes risk of falls: Secondary Verification Process Completed: Yes Signs or symptoms of abuse/neglect since last visito No Patient Requires Transmission-Based Precautions: No Hospitalized since last visit: No Patient Has Alerts: No Implantable device outside of the clinic excluding No cellular tissue based products placed in the center since last visit: Has Dressing in Place as Prescribed: Yes Pain Present Now: No Electronic Signature(s) Signed: 01/11/2022 4:26:12 PM By: Yevonne Pax RN Entered By: Yevonne Pax on 01/10/2022 15:49:42 Mcaffee, Keylee CMarland Kitchen ([id number]) -------------------------------------------------------------------------------- Clinic Level of Care Assessment Details Patient Name: Goeken, Norinne C. Date of Service: 01/10/2022 3:30 PM Medical Record Number: [id number] Patient Account Number: [id number] Date of Birth/Sex: [birth year] (61 y.o. F) Treating RN: Yevonne Pax Primary Care Jhonatan Lomeli: Card, Jonny Ruiz Other Clinician: Referring Prapti Grussing: Card, John Treating Kashina Mecum/Extender: Rowan Blase in Treatment: 12 Clinic Level of Care Assessment Items TOOL 4  Quantity Score X - Use when only an EandM is performed on FOLLOW-UP visit 1 0 ASSESSMENTS - Nursing Assessment / Reassessment X - Reassessment of Co-morbidities (includes updates in patient status) 1 10 X- 1 5 Reassessment of Adherence to Treatment Plan ASSESSMENTS - Wound and Skin Assessment / Reassessment X - Simple Wound Assessment / Reassessment - one wound 1 5 []  - 0 Complex Wound Assessment / Reassessment - multiple wounds []  - 0 Dermatologic / Skin Assessment (not related to wound area) ASSESSMENTS - Focused Assessment []  - Circumferential Edema Measurements - multi extremities 0 []  - 0 Nutritional Assessment / Counseling / Intervention []  - 0 Lower Extremity Assessment (monofilament, tuning fork, pulses) []  - 0 Peripheral Arterial Disease Assessment (using hand held doppler) ASSESSMENTS - Ostomy and/or Continence Assessment and Care []  - Incontinence Assessment and Management 0 []  - 0 Ostomy Care Assessment and Management (repouching, etc.) PROCESS - Coordination of Care X - Simple Patient / Family Education for ongoing care 1 15 []  - 0 Complex (extensive) Patient / Family Education for ongoing care []  - 0 Staff obtains Chiropractor, Records, Test Results / Process Orders []  - 0 Staff telephones HHA, Nursing Homes / Clarify orders / etc []  - 0 Routine Transfer to another Facility (non-emergent condition) []  - 0 Routine Hospital Admission (non-emergent condition) []  - 0 New Admissions / Manufacturing engineer / Ordering NPWT, Apligraf, etc. []  - 0 Emergency Hospital Admission (emergent condition) X- 1 10 Simple Discharge Coordination []  - 0 Complex (extensive) Discharge Coordination PROCESS - Special Needs []  - Pediatric / Minor Patient Management 0 []  - 0 Isolation Patient Management []  - 0 Hearing / Language / Visual special needs []  - 0 Assessment of Community assistance (transportation, D/C planning, etc.) []  - 0 Additional assistance / Altered  mentation []  - 0 Support Surface(s) Assessment (bed, cushion, seat, etc.) INTERVENTIONS - Wound Cleansing / Measurement Schlender, Pansey C. ([id number]) X- 1 5 Simple Wound Cleansing -  one wound []  - 0 Complex Wound Cleansing - multiple wounds []  - 0 Wound Imaging (photographs - any number of wounds) []  - 0 Wound Tracing (instead of photographs) X- 1 5 Simple Wound Measurement - one wound []  - 0 Complex Wound Measurement - multiple wounds INTERVENTIONS - Wound Dressings []  - Small Wound Dressing one or multiple wounds 0 []  - 0 Medium Wound Dressing one or multiple wounds []  - 0 Large Wound Dressing one or multiple wounds []  - 0 Application of Medications - topical []  - 0 Application of Medications - injection INTERVENTIONS - Miscellaneous []  - External ear exam 0 []  - 0 Specimen Collection (cultures, biopsies, blood, body fluids, etc.) []  - 0 Specimen(s) / Culture(s) sent or taken to Lab for analysis []  - 0 Patient Transfer (multiple staff / / Similar devices) []  - 0 Simple Staple / Suture removal (25 or less) []  - 0 Complex Staple / Suture removal (26 or more) []  - 0 Hypo / Hyperglycemic Management (close monitor of Blood Glucose) []  - 0 Ankle / Brachial Index (ABI) - do not check if billed separately X- 1 5 Vital Signs Has the patient been seen at the hospital within the last three years: Yes Total Score: 60 Level Of Care: New/Established - Level 2 Electronic Signature(s) Signed: 01/11/2022 4:26:12 PM By: RN Entered By: on 01/10/2022 16:17:08 Mcgonagle, Denea C ( ) -------------------------------------------------------------------------------- Encounter Discharge Information Details Patient Name: Marcon, Anara C. Date of Service: 01/10/2022 3:30 PM Medical Record Number: Patient Account Number: Date of Birth/Sex: 01/20/1961 (61 y.o. F) Treating RN: Primary Care Danyael Alipio: Nurse, adult Other Clinician: Referring Keiaira Donlan: Card, John Treating Buren Havey/Extender: in Treatment: 12 Encounter Discharge Information Items Discharge Condition: Stable Ambulatory Status: Walker Discharge Destination: Home Transportation: Private Auto Accompanied By: self Schedule Follow-up Appointment: Yes Clinical Summary of Care: Patient Declined Electronic Signature(s) Signed: 01/10/2022 4:16:18 PM By: RN Entered By: on 01/10/2022 16:16:18 Falin, Sansa C. (Yevonne Pax) -------------------------------------------------------------------------------- Wound Assessment Details Patient Name: Tinnon, Manvir C. Date of Service: 01/10/2022 3:30 PM Medical Record Number: 03/10/2022 Patient Account Number: Marland Kitchen Date of Birth/Sex: [birth year] (61 y.o. F) Treating RN: [id number] Primary Care Sharmayne Jablon: Card, [id number] Other Clinician: Referring Sherby Moncayo: Card, John Treating Kiko Ripp/Extender: 09/11/1961 in Treatment: 12 Wound Status Wound Number: 1 Primary Etiology: Dehisced Wound Wound Location: Distal, Midline Back Wound Status: Open Wounding Event: Surgical Injury Comorbid History: Hypertension Date Acquired: 04/11/2021 Weeks Of Treatment: 12 Clustered Wound: No Wound Measurements Length: (cm) 0.3 Width: (cm) 0.3 Depth: (cm) 2.8 Area: (cm) 0.071 Volume: (cm) 0.198 % Reduction in Area: 43.7% % Reduction in Volume: 65.7% Epithelialization: None Tunneling: No Undermining: No Wound Description Classification: Full Thickness Without Exposed Support Structu Exudate Amount: Medium Exudate Type: Serous Exudate Color: amber res Foul Odor After Cleansing: No Slough/Fibrino No Wound Bed Granulation Amount: Large (67-100%) Exposed Structure Granulation Quality: Red Fascia Exposed: No Necrotic Amount: None Present (0%) Fat Layer (Subcutaneous Tissue) Exposed: Yes Tendon Exposed: No Muscle Exposed: No Joint Exposed: No Bone  Exposed: No Treatment Notes Wound #1 (Back) Wound Laterality: Midline, Distal Cleanser Normal Saline Discharge Instruction: Wash your hands with soap and water. Remove old dressing, discard into plastic bag and place into trash. Cleanse the wound with Normal Saline prior to applying a clean dressing using gauze sponges, not tissues or cotton balls. Do not scrub or use excessive force. Pat dry using gauze sponges, not tissue or cotton  balls. Peri-Wound Care Topical Primary Dressing Hydrofera Blue Classic Foam Rope Dressing, 9x6 (mm/in) Discharge Instruction: cut rope in 1/4 lengthwise AND LEAVE A TAIL OUT TO GRAB IT Secondary Dressing Zetuvit Plus Silicone Border Dressing 4x4 (in/in) Secured With Compression Wrap Compression Stockings AKUA, BLETHEN ([id number]) Add-Ons Electronic Signature(s) Signed: 01/10/2022 3:55:53 PM By: Yevonne Pax RN Entered By: Yevonne Pax on 01/10/2022 15:55:52

## 2022-01-11 NOTE — Progress Notes (Signed)
[name], [name] ([id number]) Visit Report for 01/10/2022 Physician Orders Details Patient Name: [name], [name] Date of Service: 01/10/2022 3:30 PM Medical Record Number: [id number] Patient Account Number: [id number] Date of Birth/Sex: 06-19-1961 (61 y.o. F) Treating RN: Yevonne Pax Primary Care Provider: [name] Other Clinician: Referring Provider: Card, John Treating Provider/Extender: Rowan Blase in Treatment: 12 Verbal / Phone Orders: No Diagnosis Coding Follow-up Appointments o Return Appointment in 1 week. o Nurse Visit as needed - nurse visit on tuesday and thursday Home Health o Home Health Company: [facility] o [facility] for wound care. May utilize formulary equivalent dressing for wound treatment orders unless otherwise specified. Home Health Nurse may visit PRN to address patientos wound care needs. - 3 times per week [facility] fax [phone] Bathing/ Shower/ Hygiene o May shower; gently cleanse wound with antibacterial soap, rinse and pat dry prior to dressing wounds o No tub bath. Anesthetic (Use 'Patient Medications' Section for Anesthetic Order Entry) o Lidocaine applied to wound bed Edema Control - Lymphedema / Segmental Compressive Device / Other o Elevate, Exercise Daily and Avoid Standing for Long Periods of Time. o Elevate legs to the level of the heart and pump ankles as often as possible o Elevate leg(s) parallel to the floor when sitting. Additional Orders / Instructions o Follow Nutritious Diet and Increase Protein Intake Wound Treatment Wound #1 - Back Wound Laterality: Midline, Distal Cleanser: Normal Saline 1 x Per Day/30 Days Discharge Instructions: Wash your hands with soap and water. Remove old dressing, discard into plastic bag and place into trash. Cleanse the wound with Normal Saline prior to applying a clean dressing using gauze sponges, not tissues or cotton balls. Do not scrub or use excessive force.  Pat dry using gauze sponges, not tissue or cotton balls. Primary Dressing: Hydrofera Blue Classic Foam Rope Dressing, 9x6 (mm/in) 1 x Per Day/30 Days Discharge Instructions: cut rope in 1/4 lengthwise AND LEAVE A TAIL OUT TO GRAB IT Secondary Dressing: Zetuvit Plus Silicone Border Dressing 4x4 (in/in) 1 x Per Day/30 Days Electronic Signature(s) Signed: 01/10/2022 4:15:44 PM By: Yevonne Pax RN Signed: 01/11/2022 9:05:45 AM By: [name] PA-C Entered By: Yevonne Pax on 01/10/2022 16:15:44 Benassi, Melanie C. ([id number]) -------------------------------------------------------------------------------- SuperBill Details Patient Name: Mcelhinny, [name] Date of Service: 01/10/2022 Medical Record Number: [id number] Patient Account Number: [id number] Date of Birth/Sex: Feb 22, 1961 (61 y.o. 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

## 2022-01-15 ENCOUNTER — Other Ambulatory Visit: Payer: Self-pay

## 2022-01-15 DIAGNOSIS — T8131XA Disruption of external operation (surgical) wound, not elsewhere classified, initial encounter: Secondary | ICD-10-CM | POA: Diagnosis not present

## 2022-01-15 NOTE — Progress Notes (Signed)
[name], [name] ([id number]) Visit Report for 01/15/2022 Arrival Information Details Patient Name: [name], [name] [name] Date of Service: 01/15/2022 12:30 PM Medical Record Number: [id number] Patient Account Number: [id number] Date of Birth/Sex: [birth year] (61 y.o. F) Treating RN: Yevonne Pax Primary Care Arlind Klingerman: Card, Jonny Ruiz Other Clinician: Referring Jacody Beneke: Card, John Treating Nilan Iddings/Extender: Rowan Blase in Treatment: 13 Visit Information History Since Last Visit All ordered tests and consults were completed: No Patient Arrived: Ambulatory Added or deleted any medications: No Arrival Time: 12:30 Any new allergies or adverse reactions: No Accompanied By: self Had a fall or experienced change in No Transfer Assistance: None activities of daily living that may affect Patient Identification Verified: Yes risk of falls: Secondary Verification Process Completed: Yes Signs or symptoms of abuse/neglect since last visito No Patient Requires Transmission-Based Precautions: No Hospitalized since last visit: No Patient Has Alerts: No Implantable device outside of the clinic excluding No cellular tissue based products placed in the center since last visit: Has Dressing in Place as Prescribed: Yes Pain Present Now: No Electronic Signature(s) Signed: 01/15/2022 1:58:13 PM By: Yevonne Pax RN Entered By: Yevonne Pax on 01/15/2022 13:58:13 [name], [name] -------------------------------------------------------------------------------- Clinic Level of Care Assessment Details Patient Name: [name], [name] Date of Service: 01/15/2022 12:30 PM Medical Record Number: [id number] Patient Account Number: [id number] Date of Birth/Sex: [birth year] (61 y.o. F) Treating RN: Yevonne Pax Primary Care Ruqaya Strauss: Card, Jonny Ruiz Other Clinician: Referring Kimber Fritts: Card, John Treating Jadie Allington/Extender: Rowan Blase in Treatment: 13 Clinic Level of Care Assessment  Items TOOL 4 Quantity Score X - Use when only an EandM is performed on FOLLOW-UP visit 1 0 ASSESSMENTS - Nursing Assessment / Reassessment X - Reassessment of Co-morbidities (includes updates in patient status) 1 10 X- 1 5 Reassessment of Adherence to Treatment Plan ASSESSMENTS - Wound and Skin Assessment / Reassessment X - Simple Wound Assessment / Reassessment - one wound 1 5 []  - 0 Complex Wound Assessment / Reassessment - multiple wounds []  - 0 Dermatologic / Skin Assessment (not related to wound area) ASSESSMENTS - Focused Assessment []  - Circumferential Edema Measurements - multi extremities 0 []  - 0 Nutritional Assessment / Counseling / Intervention []  - 0 Lower Extremity Assessment (monofilament, tuning fork, pulses) []  - 0 Peripheral Arterial Disease Assessment (using hand held doppler) ASSESSMENTS - Ostomy and/or Continence Assessment and Care []  - Incontinence Assessment and Management 0 []  - 0 Ostomy Care Assessment and Management (repouching, etc.) PROCESS - Coordination of Care X - Simple Patient / Family Education for ongoing care 1 15 []  - 0 Complex (extensive) Patient / Family Education for ongoing care []  - 0 Staff obtains Chiropractor, Records, Test Results / Process Orders []  - 0 Staff telephones HHA, Nursing Homes / Clarify orders / etc []  - 0 Routine Transfer to another Facility (non-emergent condition) []  - 0 Routine Hospital Admission (non-emergent condition) []  - 0 New Admissions / Manufacturing engineer / Ordering NPWT, Apligraf, etc. []  - 0 Emergency Hospital Admission (emergent condition) X- 1 10 Simple Discharge Coordination []  - 0 Complex (extensive) Discharge Coordination PROCESS - Special Needs []  - Pediatric / Minor Patient Management 0 []  - 0 Isolation Patient Management []  - 0 Hearing / Language / Visual special needs []  - 0 Assessment of Community assistance (transportation, D/[name] planning, etc.) []  - 0 Additional assistance /  Altered mentation []  - 0 Support Surface(s) Assessment (bed, cushion, seat, etc.) INTERVENTIONS - Wound Cleansing / Measurement [name], [name] X- 1 5 Simple Wound Cleansing -  one wound []  - 0 Complex Wound Cleansing - multiple wounds []  - 0 Wound Imaging (photographs - any number of wounds) []  - 0 Wound Tracing (instead of photographs) X- 1 5 Simple Wound Measurement - one wound []  - 0 Complex Wound Measurement - multiple wounds INTERVENTIONS - Wound Dressings []  - Small Wound Dressing one or multiple wounds 0 X- 1 15 Medium Wound Dressing one or multiple wounds []  - 0 Large Wound Dressing one or multiple wounds []  - 0 Application of Medications - topical []  - 0 Application of Medications - injection INTERVENTIONS - Miscellaneous []  - External ear exam 0 []  - 0 Specimen Collection (cultures, biopsies, blood, body fluids, etc.) []  - 0 Specimen(s) / Culture(s) sent or taken to Lab for analysis []  - 0 Patient Transfer (multiple staff / / Similar devices) []  - 0 Simple Staple / Suture removal (25 or less) []  - 0 Complex Staple / Suture removal (26 or more) []  - 0 Hypo / Hyperglycemic Management (close monitor of Blood Glucose) []  - 0 Ankle / Brachial Index (ABI) - do not check if billed separately X- 1 5 Vital Signs Has the patient been seen at the hospital within the last three years: Yes Total Score: 75 Level Of Care: New/Established - Level 2 Electronic Signature(s) Signed: 01/15/2022 3:42:59 PM By: RN Entered By: on 01/15/2022 14:00:56 [name], [name] ( ) -------------------------------------------------------------------------------- Encounter Discharge Information Details Patient Name: [name], [name] Date of Service: 01/15/2022 12:30 PM Medical Record Number: Patient Account Number: Date of Birth/Sex: 14-Mar-1961 (61 y.o. F) Treating RN: Nurse, adult Primary Care  Oluwatobi Ruppe: Other Clinician: Referring Levell Tavano: Card, John Treating Shama Monfils/Extender: in Treatment: 13 Encounter Discharge Information Items Discharge Condition: Stable Ambulatory Status: Walker Discharge Destination: Home Transportation: Private Auto Accompanied By: self Schedule Follow-up Appointment: Yes Clinical Summary of Care: Patient Declined Electronic Signature(s) Signed: 01/15/2022 2:00:22 PM By: 01/17/2022 RN Entered By: Yevonne Pax on 01/15/2022 14:00:22 [name], [name] (01/17/2022) -------------------------------------------------------------------------------- Wound Assessment Details Patient Name: [name], [name] Date of Service: 01/15/2022 12:30 PM Medical Record Number: [id number] Patient Account Number: 01/17/2022 Date of Birth/Sex: January 10, 1961 (61 y.o. F) Treating RN: 09/11/1961 Primary Care Raha Tennison: Card, 68 Other Clinician: Referring Lynette Topete: Card, John Treating Elida Harbin/Extender: Yevonne Pax in Treatment: 13 Wound Status Wound Number: 1 Primary Etiology: Dehisced Wound Wound Location: Distal, Midline Back Wound Status: Open Wounding Event: Surgical Injury Comorbid History: Hypertension Date Acquired: 04/11/2021 Weeks Of Treatment: 13 Clustered Wound: No Wound Measurements Length: (cm) 0.3 Width: (cm) 0.3 Depth: (cm) 2.8 Area: (cm) 0.071 Volume: (cm) 0.198 % Reduction in Area: 43.7% % Reduction in Volume: 65.7% Epithelialization: None Tunneling: No Undermining: No Wound Description Classification: Full Thickness Without Exposed Support Structu Exudate Amount: Medium Exudate Type: Serous Exudate Color: amber res Foul Odor After Cleansing: No Slough/Fibrino No Wound Bed Granulation Amount: Large (67-100%) Exposed Structure Granulation Quality: Red Fascia Exposed: No Necrotic Amount: None Present (0%) Fat Layer (Subcutaneous Tissue) Exposed: Yes Tendon Exposed: No Muscle Exposed: No Joint  Exposed: No Bone Exposed: No Treatment Notes Wound #1 (Back) Wound Laterality: Midline, Distal Cleanser Normal Saline Discharge Instruction: Wash your hands with soap and water. Remove old dressing, discard into plastic bag and place into trash. Cleanse the wound with Normal Saline prior to applying a clean dressing using gauze sponges, not tissues or cotton balls. Do not scrub or use excessive force. Pat dry using gauze sponges, not tissue or cotton  balls. Peri-Wound Care Topical Primary Dressing Hydrofera Blue Classic Foam Rope Dressing, 9x6 (mm/in) Discharge Instruction: cut rope in 1/4 lengthwise AND LEAVE A TAIL OUT TO GRAB IT Secondary Dressing Zetuvit Plus Silicone Border Dressing 4x4 (in/in) Secured With Compression Wrap Compression Stockings [name], [name] ([id number]) Add-Ons Electronic Signature(s) Signed: 01/15/2022 1:58:48 PM By: Yevonne Pax RN Entered By: Yevonne Pax on 01/15/2022 13:58:47

## 2022-01-16 NOTE — Progress Notes (Signed)
[name] ([id number]) Visit Report for 01/15/2022 Physician Orders Details Patient Name: [name], [name] Date of Service: 01/15/2022 12:30 PM Medical Record Number: [id number] Patient Account Number: [id number] Date of Birth/Sex: 1961-09-13 (61 y.o. F) Treating RN: Yevonne Pax Primary Care Provider: [name] Other Clinician: Referring Provider: Card, John Treating Provider/Extender: Rowan Blase in Treatment: 57 Verbal / Phone Orders: No Diagnosis Coding Follow-up Appointments o Return Appointment in 1 week. o Nurse Visit as needed - nurse visit on tuesday and thursday Home Health o Home Health Company: [facility] o [facility] for wound care. May utilize formulary equivalent dressing for wound treatment orders unless otherwise specified. Home Health Nurse may visit PRN to address patientos wound care needs. - 3 times per week [facility] fax [phone] Bathing/ Shower/ Hygiene o May shower; gently cleanse wound with antibacterial soap, rinse and pat dry prior to dressing wounds o No tub bath. Anesthetic (Use 'Patient Medications' Section for Anesthetic Order Entry) o Lidocaine applied to wound bed Edema Control - Lymphedema / Segmental Compressive Device / Other o Elevate, Exercise Daily and Avoid Standing for Long Periods of Time. o Elevate legs to the level of the heart and pump ankles as often as possible o Elevate leg(s) parallel to the floor when sitting. Additional Orders / Instructions o Follow Nutritious Diet and Increase Protein Intake Wound Treatment Wound #1 - Back Wound Laterality: Midline, Distal Cleanser: Normal Saline 1 x Per Day/30 Days Discharge Instructions: Wash your hands with soap and water. Remove old dressing, discard into plastic bag and place into trash. Cleanse the wound with Normal Saline prior to applying a clean dressing using gauze sponges, not tissues or cotton balls. Do not scrub or use excessive force.  Pat dry using gauze sponges, not tissue or cotton balls. Primary Dressing: Hydrofera Blue Classic Foam Rope Dressing, 9x6 (mm/in) 1 x Per Day/30 Days Discharge Instructions: cut rope in 1/4 lengthwise AND LEAVE A TAIL OUT TO GRAB IT Secondary Dressing: Zetuvit Plus Silicone Border Dressing 4x4 (in/in) 1 x Per Day/30 Days Electronic Signature(s) Signed: 01/15/2022 1:59:18 PM By: Yevonne Pax RN Signed: 01/16/2022 4:48:19 PM By: [name] PA-C Entered By: Yevonne Pax on 01/15/2022 13:59:17 [name], [name] ([id number]) -------------------------------------------------------------------------------- SuperBill Details Patient Name: [name], [name] Date of Service: 01/15/2022 Medical Record Number: [id number] Patient Account Number: [id number] Date of Birth/Sex: 1961-08-28 (61 y.o. 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

## 2022-01-17 ENCOUNTER — Other Ambulatory Visit: Payer: Self-pay

## 2022-01-17 DIAGNOSIS — T8131XA Disruption of external operation (surgical) wound, not elsewhere classified, initial encounter: Secondary | ICD-10-CM | POA: Diagnosis not present

## 2022-01-17 NOTE — Progress Notes (Signed)
[name] ([id number]) Visit Report for 01/17/2022 Physician Orders Details Patient Name: [name], [name] Date of Service: 01/17/2022 11:45 AM Medical Record Number: [id number] Patient Account Number: [id number] Date of Birth/Sex: April 25, 1961 (61 y.o. F) Treating RN: Carlene Coria Primary Care Provider: Clayborn Heron Other Clinician: Referring Provider: Card, John Treating Provider/Extender: Skipper Cliche in Treatment: 5 Verbal / Phone Orders: No Diagnosis Coding Follow-up Appointments o Return Appointment in 1 week. o Nurse Visit as needed - nurse visit on tuesday and [place]: - [place] for wound care. May utilize formulary equivalent dressing for wound treatment orders unless otherwise specified. Home Health Nurse may visit PRN to address patientos wound care needs. - 3 times per week [facility] fax [phone] Bathing/ Shower/ Hygiene o May shower; gently cleanse wound with antibacterial soap, rinse and pat dry prior to dressing wounds o No tub bath. Anesthetic (Use 'Patient Medications' Section for Anesthetic Order Entry) o Lidocaine applied to wound bed Edema Control - Lymphedema / Segmental Compressive Device / Other o Elevate, Exercise Daily and Avoid Standing for Long Periods of Time. o Elevate legs to the level of the heart and pump ankles as often as possible o Elevate leg(s) parallel to the floor when sitting. Additional Orders / Instructions o Follow Nutritious Diet and Increase Protein Intake Wound Treatment Wound #1 - Back Wound Laterality: Midline, Distal Cleanser: Normal Saline 1 x Per Day/30 Days Discharge Instructions: Wash your hands with soap and water. Remove old dressing, discard into plastic bag and place into trash. Cleanse the wound with Normal Saline prior to applying a clean dressing using gauze sponges, not tissues or cotton balls. Do not scrub or use excessive force.  Pat dry using gauze sponges, not tissue or cotton balls. Primary Dressing: Hydrofera Blue Classic Foam Rope Dressing, 9x6 (mm/in) 1 x Per Day/30 Days Discharge Instructions: cut rope in 1/4 lengthwise AND LEAVE A TAIL OUT TO GRAB IT Secondary Dressing: Zetuvit Plus Silicone Border Dressing 4x4 (in/in) 1 x Per Day/30 Days Electronic Signature(s) Signed: 01/17/2022 11:45:54 AM By: Carlene Coria RN Signed: 01/17/2022 2:08:42 PM By: Worthy Keeler PA-C Entered By: Carlene Coria on 01/17/2022 11:45:54 [name], [name] ([id number]) -------------------------------------------------------------------------------- SuperBill Details Patient Name: [name], [name] Date of Service: 01/17/2022 Medical Record Number: [id number] Patient Account Number: [id number] Date of Birth/Sex: 1961/10/10 (61 y.o. 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

## 2022-01-17 NOTE — Progress Notes (Signed)
[name], [name] ([id number]) Visit Report for 01/17/2022 Arrival Information Details Patient Name: [name], [name] Date of Service: 01/17/2022 11:45 AM Medical Record Number: [id number] Patient Account Number: [id number] Date of Birth/Sex: May 05, 1961 (61 y.o. F) Treating RN: Yevonne Pax Primary Care Jeannemarie Sawaya: Card, Jonny Ruiz Other Clinician: Referring Acxel Dingee: Card, John Treating Jojuan Champney/Extender: Rowan Blase in Treatment: 13 Visit Information History Since Last Visit All ordered tests and consults were completed: No Patient Arrived: Ambulatory Added or deleted any medications: No Arrival Time: 11:44 Any new allergies or adverse reactions: No Accompanied By: self Had a fall or experienced change in No Transfer Assistance: None activities of daily living that may affect Patient Identification Verified: Yes risk of falls: Secondary Verification Process Completed: Yes Signs or symptoms of abuse/neglect since last visito No Patient Requires Transmission-Based Precautions: No Hospitalized since last visit: No Patient Has Alerts: No Implantable device outside of the clinic excluding No cellular tissue based products placed in the center since last visit: Has Dressing in Place as Prescribed: Yes Pain Present Now: No Electronic Signature(s) Signed: 01/17/2022 11:44:54 AM By: Yevonne Pax RN Entered By: Yevonne Pax on 01/17/2022 11:44:54 Chrostowski, Patrycja CMarland Kitchen ([id number]) -------------------------------------------------------------------------------- Clinic Level of Care Assessment Details Patient Name: [name], [name] Date of Service: 01/17/2022 11:45 AM Medical Record Number: [id number] Patient Account Number: [id number] Date of Birth/Sex: April 19, 1961 (61 y.o. F) Treating RN: Yevonne Pax Primary Care Cayle Cordoba: Card, Jonny Ruiz Other Clinician: Referring Ellyce Lafevers: Card, John Treating Briahnna Harries/Extender: Rowan Blase in Treatment: 13 Clinic Level of Care Assessment  Items TOOL 4 Quantity Score X - Use when only an EandM is performed on FOLLOW-UP visit 1 0 ASSESSMENTS - Nursing Assessment / Reassessment X - Reassessment of Co-morbidities (includes updates in patient status) 1 10 X- 1 5 Reassessment of Adherence to Treatment Plan ASSESSMENTS - Wound and Skin Assessment / Reassessment X - Simple Wound Assessment / Reassessment - one wound 1 5 []  - 0 Complex Wound Assessment / Reassessment - multiple wounds []  - 0 Dermatologic / Skin Assessment (not related to wound area) ASSESSMENTS - Focused Assessment []  - Circumferential Edema Measurements - multi extremities 0 []  - 0 Nutritional Assessment / Counseling / Intervention []  - 0 Lower Extremity Assessment (monofilament, tuning fork, pulses) []  - 0 Peripheral Arterial Disease Assessment (using hand held doppler) ASSESSMENTS - Ostomy and/or Continence Assessment and Care []  - Incontinence Assessment and Management 0 []  - 0 Ostomy Care Assessment and Management (repouching, etc.) PROCESS - Coordination of Care X - Simple Patient / Family Education for ongoing care 1 15 []  - 0 Complex (extensive) Patient / Family Education for ongoing care []  - 0 Staff obtains , Records, Test Results / Process Orders []  - 0 Staff telephones HHA, Nursing Homes / Clarify orders / etc []  - 0 Routine Transfer to another Facility (non-emergent condition) []  - 0 Routine Hospital Admission (non-emergent condition) []  - 0 New Admissions / / Ordering NPWT, Apligraf, etc. []  - 0 Emergency Hospital Admission (emergent condition) X- 1 10 Simple Discharge Coordination []  - 0 Complex (extensive) Discharge Coordination PROCESS - Special Needs []  - Pediatric / Minor Patient Management 0 []  - 0 Isolation Patient Management []  - 0 Hearing / Language / Visual special needs []  - 0 Assessment of Community assistance (transportation, D/C planning, etc.) []  - 0 Additional assistance /  Altered mentation []  - 0 Support Surface(s) Assessment (bed, cushion, seat, etc.) INTERVENTIONS - Wound Cleansing / Measurement Kitner, Mariame C. ( ) X- 1 5 Simple Wound Cleansing -  one wound []  - 0 Complex Wound Cleansing - multiple wounds []  - 0 Wound Imaging (photographs - any number of wounds) []  - 0 Wound Tracing (instead of photographs) X- 1 5 Simple Wound Measurement - one wound []  - 0 Complex Wound Measurement - multiple wounds INTERVENTIONS - Wound Dressings X - Small Wound Dressing one or multiple wounds 1 10 []  - 0 Medium Wound Dressing one or multiple wounds []  - 0 Large Wound Dressing one or multiple wounds []  - 0 Application of Medications - topical []  - 0 Application of Medications - injection INTERVENTIONS - Miscellaneous []  - External ear exam 0 []  - 0 Specimen Collection (cultures, biopsies, blood, body fluids, etc.) []  - 0 Specimen(s) / Culture(s) sent or taken to Lab for analysis []  - 0 Patient Transfer (multiple staff / / Similar devices) []  - 0 Simple Staple / Suture removal (25 or less) []  - 0 Complex Staple / Suture removal (26 or more) []  - 0 Hypo / Hyperglycemic Management (close monitor of Blood Glucose) []  - 0 Ankle / Brachial Index (ABI) - do not check if billed separately []  - 0 Vital Signs Has the patient been seen at the hospital within the last three years: Yes Total Score: 65 Level Of Care: New/Established - Level 2 Electronic Signature(s) Unsigned Entered By on 01/17/2022 11:46:57 Signature(s): Date(s): Strawder, Jilian C ( ) -------------------------------------------------------------------------------- Encounter Discharge Information Details Patient Name: Ponti, [name] Date of Service: 01/17/2022 11:45 AM Medical Record Number: Patient Account Number: Date of Birth/Sex: 08/21/1961 (61 y.o. F) Treating RN: Primary Care Lilliann Rossetti: Other Clinician: Referring Greyson Riccardi: Card, John Treating Brianna Esson/Extender: Nurse, adult in Treatment: 13 Encounter Discharge Information Items Discharge Condition: Stable Ambulatory Status: Walker Discharge Destination: Home Transportation: Private Auto Accompanied By: self Schedule Follow-up Appointment: Yes Clinical Summary of Care: Patient Declined Electronic Signature(s) Signed: 01/17/2022 11:46:24 AM By: RN Entered By: on 01/17/2022 11:46:23 Popescu, Nikayla C. (Yevonne Pax) -------------------------------------------------------------------------------- Wound Assessment Details Patient Name: [name], [name] C. Date of Service: 01/17/2022 11:45 AM Medical Record Number: Marland Kitchen Patient Account Number: [id number] Date of Birth/Sex: Jun 22, 1961 (61 y.o. F) Treating RN: [phone] Primary Care Ralynn San: Card, 09/11/1961 Other Clinician: Referring Claudene Gatliff: Card, John Treating Adan Baehr/Extender: 66 in Treatment: 13 Wound Status Wound Number: 1 Primary Etiology: Dehisced Wound Wound Location: Distal, Midline Back Wound Status: Open Wounding Event: Surgical Injury Comorbid History: Hypertension Date Acquired: 04/11/2021 Weeks Of Treatment: 13 Clustered Wound: No Wound Measurements Length: (cm) 0.3 Width: (cm) 0.3 Depth: (cm) 2.8 Area: (cm) 0.071 Volume: (cm) 0.198 % Reduction in Area: 43.7% % Reduction in Volume: 65.7% Epithelialization: None Tunneling: No Wound Description Classification: Full Thickness Without Exposed Support Structu Exudate Amount: Medium Exudate Type: Serous Exudate Color: amber res Foul Odor After Cleansing: No Slough/Fibrino No Wound Bed Granulation Amount: Large (67-100%) Exposed Structure Granulation Quality: Red Fascia Exposed: No Necrotic Amount: None Present (0%) Fat Layer (Subcutaneous Tissue) Exposed: Yes Tendon Exposed: No Muscle Exposed: No Joint Exposed: No Bone Exposed:  No Treatment Notes Wound #1 (Back) Wound Laterality: Midline, Distal Cleanser Normal Saline Discharge Instruction: Wash your hands with soap and water. Remove old dressing, discard into plastic bag and place into trash. Cleanse the wound with Normal Saline prior to applying a clean dressing using gauze sponges, not tissues or cotton balls. Do not scrub or use excessive force. Pat dry using gauze sponges, not tissue or cotton balls. Peri-Wound Care Topical Primary Dressing  Hydrofera Blue Classic Foam Rope Dressing, 9x6 (mm/in) Discharge Instruction: cut rope in 1/4 lengthwise AND LEAVE A TAIL OUT TO GRAB IT Secondary Dressing Zetuvit Plus Silicone Border Dressing 4x4 (in/in) Secured With Compression Wrap Compression Stockings LYNDSEA, HINEBAUGH ([id number]) Add-Ons Electronic Signature(s) Signed: 01/17/2022 11:45:25 AM By: Yevonne Pax RN Entered By: Yevonne Pax on 01/17/2022 11:45:25

## 2022-01-22 ENCOUNTER — Other Ambulatory Visit: Payer: Self-pay

## 2022-01-22 ENCOUNTER — Encounter: Payer: 59 | Admitting: Physician Assistant

## 2022-01-22 DIAGNOSIS — T8131XA Disruption of external operation (surgical) wound, not elsewhere classified, initial encounter: Secondary | ICD-10-CM | POA: Diagnosis not present

## 2022-01-22 NOTE — Progress Notes (Addendum)
REDINA, ZELLER ([id number]) Visit Report for 01/22/2022 Chief Complaint Document Details Patient Name: [name] Date of Service: 01/22/2022 12:30 PM Medical Record Number: [id number] Patient Account Number: [id number] Date of Birth/Sex: 16-Jun-1961 (61 y.o. F) Treating RN: Yevonne Pax Primary Care Provider: [name] Other Clinician: Referring Provider: Card, John Treating Provider/Extender: Rowan Blase in Treatment: 14 Information Obtained from: Patient Chief Complaint Surgical Back Ulcer Electronic Signature(s) Signed: 01/22/2022 1:00:03 PM By: [name] PA-[name] Entered By: [name] on 01/22/2022 13:00:02 [name], [name] ([id number]) -------------------------------------------------------------------------------- HPI Details Patient Name: [name] Date of Service: 01/22/2022 12:30 PM Medical Record Number: [id number] Patient Account Number: [id number] Date of Birth/Sex: [birth year] (61 y.o. F) Treating RN: Yevonne Pax Primary Care Provider: [name] Other Clinician: Referring Provider: Card, John Treating Provider/Extender: Rowan Blase in Treatment: 14 History of Present Illness HPI Description: 10/15/2021 upon evaluation today patient presents for initial evaluation here in the clinic concerning a surgical ulceration/dehiscence in the lumbar spine region following surgery that she had over the past year. This was actually broken up into 3 separate surgical events. The initial surgical intervention actually was on November 05, 2021 almost a year ago. Subsequently the patient went back in February for a seroma of the area which unfortunately required her to have a repeat surgery to go in and clean this out. And then again this occurred in April where she went back in and again they felt like stitches were coming out and there was an additional seroma. She was placed in a wound VAC initially and then subsequently as it got smaller that  was discontinued. Again right now I will see anything that I think a wound VAC would help with. Nonetheless she definitely has a significant depth to the wound that is going require packing. I actually believe the Hydrofera Blue rope would probably do quite well with this the problem is as much as it is draining she probably needs this to be changed at least every day. She does not really have anyone that can help with that that is the complicating scenario here. With that being said the patient does have a history of multiple sclerosis, hypertension, and again this surgical wound dehiscence in regard to her lumbar spine region. She did have a repeat MRI which was actually completed 10/09/2021. This showed that there was no significant change in the subcutaneous fluid collection/track of the lower lumbar region. This is extending to the level of the fascia unfortunately. This seems to go all the way from the L2 level with a track extending all the way to the fascia at the L4-5 level. Again this is a significant wound and there is significant drainage but does not seem to communicate to the spinal region as far as spinal fluid or otherwise is concerned that is good news. Nonetheless she last saw Dr. Franky Macho who is her neurosurgeon on 10/01/2021 that was when he ordered this last MRI she supposed to see him next week as well. With that being said he did not feel like there was any significant issue there but was not sure why this was not healing that is when he ordered the MRI. They were wanting to make sure that this was packed appropriately by home health unfortunately the main issue currently is that home health is completely out of the picture as the patient has exhausted all the home health that that she gets for a year. She is now in a very difficult  predicament where she does not have anyone to help her change the dressing and to be honest that she is not able to do it herself with the location of  the wound being on the midline lumbar spine region. If she does not have anyone that can help it is probably can to be necessary for her to go to a facility for rehab and daily dressing changes as I feel like daily changes which is much drainage that she is having is going to be necessary. 10/23/2021 upon evaluation today patient appears to be doing decently well in regard to her back ulcer. This does seem to be draining a lot less than what it is been doing in the past. With that being said she still has quite a bit of drainage nonetheless. I do think that given time this should improve least I hope so. The good news is she does have home health coming out 3 days a week were doing it 2 days a week and she is paying someone we can to help. 10/30/2021 upon evaluation today patient appears to be doing okay in regard to her back ulcer this is not draining quite as bad as it was in the beginning but he still has quite a bit of drainage noted. I do believe that the patient would benefit from [place] going ahead forward with attempting a wound VAC using the Hydrofera Blue rope to pack with and then subsequently using the VAC externally to actually suction out and help this to fill- in. I think this is our ideal way to try to get things cleared at this point. As it stands I am not certain that we are really making a progress that we want to see near with doing it in the way we are which is packing with the rope. It is a good dressing but I do think it is insufficient for total healing. She just seems to have too much in the way of drainage at this point unfortunately. 11/06/2021 upon evaluation today patient unfortunately continues to have issues with her back ongoing. The good news is her MRI that was repeated showed signs of the size of this area in the lumbar spine region having decreased from 4 cm to 3.5 cm this is definitely not bad news at all. With that being said unfortunately she continues to have issues  with ongoing drainage not as severe as in the beginning but nonetheless still significant. I do think a wound VAC still would be a good way to go although her home health agency nurse apparently has some concerns about the possibility of not being able to keep a seal with this as they had struggles in the past. Nonetheless I explained to the patient that this is much different than what she had previous and that I really feel like it would do much better as far as getting the area taken care of without having any complications or issues here. I think that we should be able to maintain a seal. Nonetheless at this time I did discuss with the patient as well that she probably does need to have a wound VAC in order for [place] to get this moving in the right direction. 11/13/2021 upon evaluation patient's wound bed actually showed signs of significant drainage at this time. She did see the surgeon yesterday he did not see anything that appeared to be infected. Nonetheless he does appear that she is continuing to have areas here that just do not seem  to want to seal up there MRI findings have been negative but nonetheless she continues to have is the seroma that is filling in. I do feel like we need to try to widen the hole so we can get at least a half of the [facility] then this will be better than nothing at this point. 11/20/2021 upon evaluation today patient actually appears to be having less pain at this point which is good news and overall she we still do not have the results of the culture back yet it had to be sent out to [facility] and we do not have the result back yet. Is doing decently well in regard to her wound. Fortunately there does not appear to be any signs of active infection systemically nor locally at this time. 11/27/2021 upon evaluation today patient appears to be doing well with regard to her wound all things considered there does appear to be less drainage than there was previous.  Fortunately I do not see any evidence of worsening of the patient is stating that she is having some issues with back pain. This is somewhat new. Again this I think could be related to the fact that she is having some issues here with infection. We are still waiting to see what the result of her culture shows from susceptibility testing Enterococcus has been identified but we do not know if this is VRE or not. 12/04/2021 upon evaluation today patient appears to be doing well with regard to her wound all things considered. I did have a conversation with Erin from Dr. Lacy Duverney office. Of note she notes that Dr. Joetta Manners really does not want to do anything surgical right now which I completely understand. With that being said I am still leery of how far we will make it getting this to heal short of any type of surgery to open this up and allow [place] to more appropriately packed the wound. Nonetheless I will absolutely give it our best shot as far as that is concerned. I discussed that with the patient today. She voiced understanding. The good news is the drainage today seems to be clear it is no longer cloudy as it was previous I am actually very pleased in that regard. [name], [name] ([id number]) 12/11/2021 since have last seen the patient actually did have an conversation with Dr. Christella Noa with her neurosurgeon. Again this involve the discussion around whether or not to open the wound and try to apply a wound VAC following. With that being said the decision was made that that may be the best thing to do if the patient was in agreement. Nonetheless I am actually extremely encouraged with what I am seeing today much more than I would have thought. In fact I think that we may be over packing the wound which is why she is having some discomfort at this point as there is much less of the dressing able to get and this time compared to what we saw previous. That is actually really good news. In fact the 6:00  tunnel appears to have filled in is awesome news. At the 12:00 location I am actually able to pack into that area pretty effectively at this point today. In fact I was able to get less of the packing in which I will detail below. 12/18/2021 upon evaluation today patient appears to be doing well with regard to her wound on the back. Fortunately there is no signs of active infection which is great news and overall  very pleased with where things stand today. No fevers, chills, nausea, vomiting, or diarrhea. 01/01/2022 upon evaluation today patient appears to be doing decently well in regard to her wound. Fortunately there does not appear to be any signs of anything worsening which is great news. She still continues to have issues with drainage but this is not nearly as significant as what it was in the past. There is all clear drainage no signs of purulence noted. 01/08/2022 upon evaluation today patient appears to be doing well with regard to her wound. With that being said she tells me that she has been having some increased pain over the past week. It does appear based on the amount of Hydrofera Blue that we removed this is probably over pack. Again it is difficult to know exactly how old the nurses getting all the skin but nonetheless the length of Hydrofera Blue that was utilized was way too much which may be part of the reason why this felt so uncomfortable. Fortunately I think that we can cut back on that we discussed pieces smaller so hopefully that will not be over packed. 01/22/2021 upon evaluation today patient appears to be doing well With regard to there being no signs of infection at this time. The wound does still tunnel mainly up at the 12:00 location. The depth of the tunnel complete from entry point to the base is about 3.5 cm today. With that being said I do believe that overall she is making good progress this is just a very slow process for her which I know has been frustrating as  well. Electronic Signature(s) Signed: 01/22/2022 1:36:23 PM By: [name] PA-[name] Entered By: [name] on 01/22/2022 13:36:22 [name], [name] Salena Saner ([id number]) -------------------------------------------------------------------------------- Physical Exam Details Patient Name: Bialy, Telecia [name]. Date of Service: 01/22/2022 12:30 PM Medical Record Number: [id number] Patient Account Number: [id number] Date of Birth/Sex: 07-25-1961 (61 y.o. F) Treating RN: Yevonne Pax Primary Care Provider: [name] Other Clinician: Referring Provider: Card, John Treating Provider/Extender: Rowan Blase in Treatment: 14 Constitutional Chronically ill appearing but in no apparent acute distress. Respiratory normal breathing without difficulty. Psychiatric this patient is able to make decisions and demonstrates good insight into disease process. Alert and Oriented x 3. pleasant and cooperative. Notes Upon inspection patient's wound bed actually showed signs of good granulation from what I can see around the entry part of the wound although obviously the majority of this is internal and not really visible at this point. The good news is I do not see any signs of purulent drainage which would indicate infection at this time she is also not having any increased pain both of which have been issues that been noted previous when she did have an infection. Electronic Signature(s) Signed: 01/22/2022 1:36:49 PM By: [name] PA-[name] Entered By: [name] on 01/22/2022 13:36:48 Merry, Domingo Pulse ([id number]) -------------------------------------------------------------------------------- Physician Orders Details Patient Name: [name] Date of Service: 01/22/2022 12:30 PM Medical Record Number: [id number] Patient Account Number: [id number] Date of Birth/Sex: 06/24/1961 (61 y.o. 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 o Nurse Visit  as needed - nurse visit on tuesday and thursday Home Health o Home Health Company: [facility] o [name] for wound care. May utilize formulary equivalent dressing for wound treatment orders unless otherwise specified. Home Health Nurse may visit PRN to address patientos wound care needs. - 3 times per week [facility] fax [phone] Bathing/ Shower/ Hygiene o May shower; gently cleanse wound with antibacterial soap, rinse and pat dry prior to dressing wounds o No tub bath. Anesthetic (Use 'Patient Medications' Section for Anesthetic Order Entry) o Lidocaine applied to wound bed Edema Control - Lymphedema / Segmental Compressive Device / Other o Elevate, Exercise Daily and Avoid Standing for Long Periods of Time. o Elevate legs to the level of the heart and pump ankles as often as possible o Elevate leg(s) parallel to the floor when sitting. Additional Orders / Instructions o Follow Nutritious Diet and Increase Protein Intake Wound Treatment Wound #1 - Back Wound Laterality: Midline, Distal Cleanser: Normal Saline 1 x Per Day/30 Days Discharge Instructions: Wash your hands with soap and water. Remove old dressing, discard into plastic bag and place into trash. Cleanse the wound with Normal Saline prior to applying a clean dressing using gauze sponges, not tissues or cotton balls. Do not scrub or use excessive force. Pat dry using gauze sponges, not tissue or cotton balls. Primary Dressing: Hydrofera Blue Classic Foam  Rope Dressing, 9x6 (mm/in) 1 x Per Day/30 Days Discharge Instructions: cut rope in 1/4 lengthwise AND LEAVE A TAIL OUT TO GRAB IT Secondary Dressing: Zetuvit Plus Silicone Border Dressing 4x4 (in/in) 1 x Per Day/30 Days Electronic Signature(s) Signed: 01/22/2022 1:59:18 PM By: Yevonne Pax RN Signed: 01/22/2022 6:21:10 PM By: [name] PA-[name] Entered By: Yevonne Pax on 01/22/2022 13:13:52 [name] ([id number]) -------------------------------------------------------------------------------- Problem List Details Patient Name: [name] Date of Service: 01/22/2022 12:30 PM Medical Record Number: [id number] Patient Account Number: [id number] Date of Birth/Sex: January 27, 1961 (61 y.o. 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 ([id number]) -------------------------------------------------------------------------------- Progress Note Details Patient Name: [name] Date of Service: 01/22/2022 12:30 PM Medical Record Number: [id number] Patient Account Number: [id number] Date of Birth/Sex: 24-Nov-1961 (61 y.o. F) Treating RN: Yevonne Pax Primary Care Provider: [name] Other Clinician: Referring Provider: Card, John Treating Provider/Extender: Rowan Blase in Treatment:  14 Subjective Chief Complaint Information obtained from Patient Surgical Back Ulcer History of Present Illness (HPI) 10/15/2021 upon evaluation today patient presents for initial evaluation here in the clinic concerning a surgical ulceration/dehiscence in the lumbar spine region following surgery that she had over the past year. This was actually broken up into 3 separate surgical events. The initial surgical intervention actually was on November 05, 2021 almost a year ago. Subsequently the patient went back in February for a seroma of the area which unfortunately required her to have a repeat surgery to go in and clean this out. And then again this occurred in April where she went back in  and again they felt like stitches were coming out and there was an additional seroma. She was placed in a wound VAC initially and then subsequently as it got smaller that was discontinued. Again right now I will see anything that I think a wound VAC would help with. Nonetheless she definitely has a significant depth to the wound that is going require packing. I actually believe the Hydrofera Blue rope would probably do quite well with this the problem is as much as it is draining she probably needs this to be changed at least every day. She does not really have anyone that can help with that that is the complicating scenario here. With that being said the patient does have a history of multiple sclerosis, hypertension, and again this surgical wound dehiscence in regard to her lumbar spine region. She did have a repeat MRI which was actually completed 10/09/2021. This showed that there was no significant change in the subcutaneous fluid collection/track of the lower lumbar region. This is extending to the level of the fascia unfortunately. This seems to go all the way from the L2 level with a track extending all the way to the fascia at the L4-5 level. Again this is a significant wound and there is significant  drainage but does not seem to communicate to the spinal region as far as spinal fluid or otherwise is concerned that is good news. Nonetheless she last saw Dr. Franky Macho who is her neurosurgeon on 10/01/2021 that was when he ordered this last MRI she supposed to see him next week as well. With that being said he did not feel like there was any significant issue there but was not sure why this was not healing that is when he ordered the MRI. They were wanting to make sure that this was packed appropriately by home health unfortunately the main issue currently is that home health is completely out of the picture as the patient has exhausted all the home health that that she gets for a year. She is now in a very difficult predicament where she does not have anyone to help her change the dressing and to be honest that she is not able to do it herself with the location of the wound being on the midline lumbar spine region. If she does not have anyone that can help it is probably can to be necessary for her to go to a facility for rehab and daily dressing changes as I feel like daily changes which is much drainage that she is having is going to be necessary. 10/23/2021 upon evaluation today patient appears to be doing decently well in regard to her back ulcer. This does seem to be draining a lot less than what it is been doing in the past. With that being said she still has quite a bit of drainage nonetheless. I do think that given time this should improve least I hope so. The good news is she does have home health coming out 3 days a week were doing it 2 days a week and she is paying someone we can to help. 10/30/2021 upon evaluation today patient appears to be doing okay in regard to her back ulcer this is not draining quite as bad as it was in the beginning but he still has quite a bit of drainage noted. I do believe that the patient would benefit from [place] going ahead forward with attempting a wound VAC using  the Hydrofera Blue rope to pack with and then  subsequently using the [facility] externally to actually suction out and help this to fill- in. I think this is our ideal way to try to get things cleared at this point. As it stands I am not certain that we are really making a progress that we want to see near with doing it in the way we are which is packing with the rope. It is a good dressing but I do think it is insufficient for total healing. She just seems to have too much in the way of drainage at this point unfortunately. 11/06/2021 upon evaluation today patient unfortunately continues to have issues with her back ongoing. The good news is her MRI that was repeated showed signs of the size of this area in the lumbar spine region having decreased from 4 cm to 3.5 cm this is definitely not bad news at all. With that being said unfortunately she continues to have issues with ongoing drainage not as severe as in the beginning but nonetheless still significant. I do think a wound VAC still would be a good way to go although her home health agency nurse apparently has some concerns about the possibility of not being able to keep a seal with this as they had struggles in the past. Nonetheless I explained to the patient that this is much different than what she had previous and that I really feel like it would do much better as far as getting the area taken care of without having any complications or issues here. I think that we should be able to maintain a seal. Nonetheless at this time I did discuss with the patient as well that she probably does need to have a wound VAC in order for [place] to get this moving in the right direction. 11/13/2021 upon evaluation patient's wound bed actually showed signs of significant drainage at this time. She did see the surgeon yesterday he did not see anything that appeared to be infected. Nonetheless he does appear that she is continuing to have areas here that just do not seem  to want to seal up there MRI findings have been negative but nonetheless she continues to have is the seroma that is filling in. I do feel like we need to try to widen the hole so we can get at least a half of the [facility] then this will be better than nothing at this point. 11/20/2021 upon evaluation today patient actually appears to be having less pain at this point which is good news and overall she we still do not have the results of the culture back yet it had to be sent out to [facility] and we do not have the result back yet. Is doing decently well in regard to her wound. Fortunately there does not appear to be any signs of active infection systemically nor locally at this time. 11/27/2021 upon evaluation today patient appears to be doing well with regard to her wound all things considered there does appear to be less drainage than there was previous. Fortunately I do not see any evidence of worsening of the patient is stating that she is having some issues with back pain. This is somewhat new. Again this I think could be related to the fact that she is having some issues here with infection. We are still waiting to see what the result of her culture shows from susceptibility testing Enterococcus has been identified but we do not know if this is VRE or not. 12/04/2021 upon evaluation today patient  appears to be doing well with regard to her wound all things considered. I did have a conversation with Erin from Dr. Sueanne Margarita office. Of note she notes that Dr. Drinda Butts really does not want to do anything surgical right now which I completely Meske, Nakari [name]. ([id number]) understand. With that being said I am still leery of how far we will make it getting this to heal short of any type of surgery to open this up and allow [place] to more appropriately packed the wound. Nonetheless I will absolutely give it our best shot as far as that is concerned. I discussed that with the patient today. She voiced  understanding. The good news is the drainage today seems to be clear it is no longer cloudy as it was previous I am actually very pleased in that regard. 12/11/2021 since have last seen the patient actually did have an conversation with Dr. Franky Macho with her neurosurgeon. Again this involve the discussion around whether or not to open the wound and try to apply a wound VAC following. With that being said the decision was made that that may be the best thing to do if the patient was in agreement. Nonetheless I am actually extremely encouraged with what I am seeing today much more than I would have thought. In fact I think that we may be over packing the wound which is why she is having some discomfort at this point as there is much less of the dressing able to get and this time compared to what we saw previous. That is actually really good news. In fact the 6:00 tunnel appears to have filled in is awesome news. At the 12:00 location I am actually able to pack into that area pretty effectively at this point today. In fact I was able to get less of the packing in which I will detail below. 12/18/2021 upon evaluation today patient appears to be doing well with regard to her wound on the back. Fortunately there is no signs of active infection which is great news and overall very pleased with where things stand today. No fevers, chills, nausea, vomiting, or diarrhea. 01/01/2022 upon evaluation today patient appears to be doing decently well in regard to her wound. Fortunately there does not appear to be any signs of anything worsening which is great news. She still continues to have issues with drainage but this is not nearly as significant as what it was in the past. There is all clear drainage no signs of purulence noted. 01/08/2022 upon evaluation today patient appears to be doing well with regard to her wound. With that being said she tells me that she has been having some increased pain over the past week.  It does appear based on the amount of Hydrofera Blue that we removed this is probably over pack. Again it is difficult to know exactly how old the nurses getting all the skin but nonetheless the length of Hydrofera Blue that was utilized was way too much which may be part of the reason why this felt so uncomfortable. Fortunately I think that we can cut back on that we discussed pieces smaller so hopefully that will not be over packed. 01/22/2021 upon evaluation today patient appears to be doing well With regard to there being no signs of infection at this time. The wound does still tunnel mainly up at the 12:00 location. The depth of the tunnel complete from entry point to the base is about 3.5 cm today. With that being said  I do believe that overall she is making good progress this is just a very slow process for her which I know has been frustrating as well. Objective Constitutional Chronically ill appearing but in no apparent acute distress. Vitals Time Taken: 12:44 PM, Weight: 275 lbs, Temperature: 98.3 F, Pulse: 80 bpm, Respiratory Rate: 18 breaths/min, Blood Pressure: 121/79 mmHg. Respiratory normal breathing without difficulty. Psychiatric this patient is able to make decisions and demonstrates good insight into disease process. Alert and Oriented x 3. pleasant and cooperative. General Notes: Upon inspection patient's wound bed actually showed signs of good granulation from what I can see around the entry part of the wound although obviously the majority of this is internal and not really visible at this point. The good news is I do not see any signs of purulent drainage which would indicate infection at this time she is also not having any increased pain both of which have been issues that been noted previous when she did have an infection. Integumentary (Hair, Skin) Wound #1 status is Open. Original cause of wound was Surgical Injury. The date acquired was: 04/11/2021. The wound has  been in treatment 14 weeks. The wound is located on the Distal,Midline Back. The wound measures 0.3cm length x 0.3cm width x 3cm depth; 0.071cm^2 area and 0.212cm^3 volume. There is Fat Layer (Subcutaneous Tissue) exposed. There is no tunneling or undermining noted. There is a medium amount of serous drainage noted. There is large (67-100%) red granulation within the wound bed. There is no necrotic tissue within the wound bed. 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 ([id number]) Plan Follow-up Appointments: Return Appointment in 1 week. Nurse Visit as needed - nurse visit on tuesday and thursday Home Health: Home Health Company: - [facility] for wound care. May utilize formulary equivalent dressing for wound treatment orders unless otherwise specified. Home Health Nurse may visit PRN to address patient s wound care needs. - 3 times per week [facility] fax [phone] Bathing/ Shower/ Hygiene: May shower; gently cleanse wound with antibacterial soap, rinse and pat dry prior to dressing wounds No tub bath. Anesthetic (Use 'Patient Medications' Section for Anesthetic Order Entry): Lidocaine applied to wound bed Edema Control - Lymphedema / Segmental Compressive Device / Other: Elevate, Exercise Daily and Avoid Standing for Long Periods of Time. Elevate legs to the level of the heart and pump ankles as often as possible Elevate leg(s) parallel to the floor when sitting. Additional Orders / Instructions: Follow Nutritious Diet and Increase Protein Intake WOUND #1: - Back Wound Laterality: Midline, Distal Cleanser: Normal Saline 1 x Per Day/30 Days Discharge Instructions: Wash your hands with soap and water. Remove old dressing, discard into plastic bag and  place into trash. Cleanse the wound with Normal Saline prior to applying a clean dressing using gauze sponges, not tissues or cotton balls. Do not scrub or use excessive force. Pat dry using gauze sponges, not tissue or cotton balls. Primary Dressing: Hydrofera Blue Classic Foam Rope Dressing, 9x6 (mm/in) 1 x Per Day/30 Days Discharge Instructions: cut rope in 1/4 lengthwise AND LEAVE A TAIL OUT TO GRAB IT Secondary Dressing: Zetuvit Plus Silicone Border Dressing 4x4 (in/in) 1 x Per Day/30 Days 1. I Ernie Hew suggest that we go ahead and continue with the wound care measures as before utilizing the [facility] Blue rope which I think has  been of benefit. 2. I am also can recommend that we have the patient continue with the border foam dressing to cover which I think is doing a good job. 3. We will also continue to change this pretty much every day and hopefully that will continue to do well for her. We will see patient back for reevaluation in 1 week here in the clinic. If anything worsens or changes patient will contact our office for additional recommendations. Electronic Signature(s) Signed: 01/22/2022 1:37:16 PM By: [name] PA-[name] Entered By: [name] on 01/22/2022 13:37:16 Zwick, Domingo Pulse ([id number]) -------------------------------------------------------------------------------- SuperBill Details Patient Name: Sutcliffe, Jamyia [name]. Date of Service: 01/22/2022 Medical Record Number: [id number] Patient Account Number: [id number] Date of Birth/Sex: 1961/06/12 (61 y.o. 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 G35 Multiple  sclerosis I10 Essential (primary) hypertension Facility Procedures CPT4 Code: [id number] Description: 99213 - WOUND CARE VISIT-LEV 3 EST PT Modifier: Quantity: 1 Physician Procedures CPT4 Code: [id number] Description: 99214 - WC PHYS LEVEL 4 - EST PT Modifier: Quantity: 1 CPT4 Code: Description: ICD-10 Diagnosis Description T81.31XA Disruption of external operation (surgical) wound, not elsewhere classifi 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

## 2022-01-22 NOTE — Progress Notes (Signed)
JALEE, SAINE ([id number]) Visit Report for 01/22/2022 Arrival Information Details Patient Name: [name], [name] Date of Service: 01/22/2022 12:30 PM Medical Record Number: [id number] Patient Account Number: [id number] Date of Birth/Sex: [birth year] (61 y.o. F) Treating RN: Yevonne Pax Primary Care Tahj Njoku: Card, Jonny Ruiz Other Clinician: Referring Carsen Leaf: Card, John Treating Dymin Dingledine/Extender: Rowan Blase in Treatment: 14 Visit Information History Since Last Visit All ordered tests and consults were completed: No Patient Arrived: [name] Added or deleted any medications: No Arrival Time: 12:37 Any new allergies or adverse reactions: No Accompanied By: self Had a fall or experienced change in No Transfer Assistance: None activities of daily living that may affect Patient Identification Verified: Yes risk of falls: Secondary Verification Process Completed: Yes Signs or symptoms of abuse/neglect since last visito No Patient Requires Transmission-Based Precautions: No Hospitalized since last visit: No Patient Has Alerts: No Implantable device outside of the clinic excluding No cellular tissue based products placed in the center since last visit: Has Dressing in Place as Prescribed: Yes Pain Present Now: No Electronic Signature(s) Signed: 01/22/2022 1:59:18 PM By: Yevonne Pax RN Entered By: Yevonne Pax on 01/22/2022 12:44:18 Ullmer, Angla C. ([id number]) -------------------------------------------------------------------------------- Clinic Level of Care Assessment Details Patient Name: [name], [name] Date of Service: 01/22/2022 12:30 PM Medical Record Number: [id number] Patient Account Number: [id number] Date of Birth/Sex: July 05, 1961 (61 y.o. F) Treating RN: Yevonne Pax Primary Care Sai Moura: Card, Jonny Ruiz Other Clinician: Referring Furious Chiarelli: Card, John Treating Almina Schul/Extender: Rowan Blase in Treatment: 14 Clinic Level of Care Assessment Items TOOL  4 Quantity Score X - Use when only an EandM is performed on FOLLOW-UP visit 1 0 ASSESSMENTS - Nursing Assessment / Reassessment X - Reassessment of Co-morbidities (includes updates in patient status) 1 10 X- 1 5 Reassessment of Adherence to Treatment Plan ASSESSMENTS - Wound and Skin Assessment / Reassessment X - Simple Wound Assessment / Reassessment - one wound 1 5 []  - 0 Complex Wound Assessment / Reassessment - multiple wounds []  - 0 Dermatologic / Skin Assessment (not related to wound area) ASSESSMENTS - Focused Assessment []  - Circumferential Edema Measurements - multi extremities 0 []  - 0 Nutritional Assessment / Counseling / Intervention []  - 0 Lower Extremity Assessment (monofilament, tuning fork, pulses) []  - 0 Peripheral Arterial Disease Assessment (using hand held doppler) ASSESSMENTS - Ostomy and/or Continence Assessment and Care []  - Incontinence Assessment and Management 0 []  - 0 Ostomy Care Assessment and Management (repouching, etc.) PROCESS - Coordination of Care X - Simple Patient / Family Education for ongoing care 1 15 []  - 0 Complex (extensive) Patient / Family Education for ongoing care []  - 0 Staff obtains , Records, Test Results / Process Orders []  - 0 Staff telephones HHA, Nursing Homes / Clarify orders / etc []  - 0 Routine Transfer to another Facility (non-emergent condition) []  - 0 Routine Hospital Admission (non-emergent condition) []  - 0 New Admissions / / Ordering NPWT, Apligraf, etc. []  - 0 Emergency Hospital Admission (emergent condition) X- 1 10 Simple Discharge Coordination []  - 0 Complex (extensive) Discharge Coordination PROCESS - Special Needs []  - Pediatric / Minor Patient Management 0 []  - 0 Isolation Patient Management []  - 0 Hearing / Language / Visual special needs []  - 0 Assessment of Community assistance (transportation, D/C planning, etc.) []  - 0 Additional assistance / Altered  mentation []  - 0 Support Surface(s) Assessment (bed, cushion, seat, etc.) INTERVENTIONS - Wound Cleansing / Measurement Lubin, Oyinkansola C. ( ) X- 1 5 Simple Wound Cleansing -  one wound []  - 0 Complex Wound Cleansing - multiple wounds X- 1 5 Wound Imaging (photographs - any number of wounds) []  - 0 Wound Tracing (instead of photographs) X- 1 5 Simple Wound Measurement - one wound []  - 0 Complex Wound Measurement - multiple wounds INTERVENTIONS - Wound Dressings []  - Small Wound Dressing one or multiple wounds 0 X- 1 15 Medium Wound Dressing one or multiple wounds []  - 0 Large Wound Dressing one or multiple wounds []  - 0 Application of Medications - topical []  - 0 Application of Medications - injection INTERVENTIONS - Miscellaneous []  - External ear exam 0 []  - 0 Specimen Collection (cultures, biopsies, blood, body fluids, etc.) []  - 0 Specimen(s) / Culture(s) sent or taken to Lab for analysis []  - 0 Patient Transfer (multiple staff / / Similar devices) []  - 0 Simple Staple / Suture removal (25 or less) []  - 0 Complex Staple / Suture removal (26 or more) []  - 0 Hypo / Hyperglycemic Management (close monitor of Blood Glucose) []  - 0 Ankle / Brachial Index (ABI) - do not check if billed separately X- 1 5 Vital Signs Has the patient been seen at the hospital within the last three years: Yes Total Score: 80 Level Of Care: New/Established - Level 3 Electronic Signature(s) Signed: 01/22/2022 1:59:18 PM By: RN Entered By: on 01/22/2022 13:14:24 Gianino, Ellenore C ( ) -------------------------------------------------------------------------------- Encounter Discharge Information Details Patient Name: [name], [name] Date of Service: 01/22/2022 12:30 PM Medical Record Number: Patient Account Number: Date of Birth/Sex: May 13, 1961 (61 y.o. F) Treating RN: Primary Care Kjell Brannen:  Other Clinician: Referring Teandra Harlan: Card, John Treating Mykela Mewborn/Extender: in Treatment: 14 Encounter Discharge Information Items Discharge Condition: Stable Ambulatory Status: Walker Discharge Destination: Home Transportation: Private Auto Accompanied By: self Schedule Follow-up Appointment: Yes Clinical Summary of Care: Patient Declined Electronic Signature(s) Signed: 01/22/2022 1:59:18 PM By: 01/24/2022 RN Entered By: Yevonne Pax on 01/22/2022 13:15:31 Madewell, Karyna C. (01/24/2022) -------------------------------------------------------------------------------- Lower Extremity Assessment Details Patient Name: [name], [name] Date of Service: 01/22/2022 12:30 PM Medical Record Number: [id number] Patient Account Number: 01/24/2022 Date of Birth/Sex: 11/23/1961 (61 y.o. F) Treating RN: 09/11/1961 Primary Care Jozlynn Plaia: 68 Other Clinician: Referring Ghada Abbett: Card, John Treating Laqueshia Cihlar/Extender: Yevonne Pax in Treatment: 14 Electronic Signature(s) Signed: 01/22/2022 1:59:18 PM By: Rowan Blase RN Entered By: 01/24/2022 on 01/22/2022 12:45:39 Arch, Coralyn CYevonne Pax (01/24/2022) -------------------------------------------------------------------------------- Multi Wound Chart Details Patient Name: [name], [name] Date of Service: 01/22/2022 12:30 PM Medical Record Number: 01/24/2022 Patient Account Number: [id number] Date of Birth/Sex: 03-07-1961 (61 y.o. F) Treating RN: 68 Primary Care Guiliana Shor: Yevonne Pax Other Clinician: Referring Gizella Belleville: Card, John Treating Kattleya Kuhnert/Extender: [name] in Treatment: 14 Vital Signs Height(in): Pulse(bpm): 80 Weight(lbs): 275 Blood Pressure(mmHg): 121/79 Body Mass Index(BMI): Temperature(F): 98.3 Respiratory Rate(breaths/min): 18 Photos: [N/A:N/A] Wound Location: Distal, Midline Back N/A N/A Wounding Event: Surgical Injury N/A N/A Primary Etiology: Dehisced  Wound N/A N/A Comorbid History: Hypertension N/A N/A Date Acquired: 04/11/2021 N/A N/A Weeks of Treatment: 14 N/A N/A Wound Status: Open N/A N/A Wound Recurrence: No N/A N/A Measurements L x W x D (cm) 0.3x0.3x3 N/A N/A Area (cm) : 0.071 N/A N/A Volume (cm) : 0.212 N/A N/A % Reduction in Area: 43.70% N/A N/A % Reduction in Volume: 63.30% N/A N/A Classification: Full Thickness Without Exposed N/A N/A Support Structures Exudate Amount: Medium N/A N/A Exudate Type: Serous N/A N/A Exudate Color:  amber N/A N/A Granulation Amount: Large (67-100%) N/A N/A Granulation Quality: Red N/A N/A Necrotic Amount: None Present (0%) N/A N/A Exposed Structures: Fat Layer (Subcutaneous Tissue): N/A N/A Yes Fascia: No Tendon: No Muscle: No Joint: No Bone: No Epithelialization: None N/A N/A Treatment Notes Electronic Signature(s) Signed: 01/22/2022 1:59:18 PM By: Yevonne Pax RN Entered By: Yevonne Pax on 01/22/2022 13:12:03 Ricciardi, Tyrica Salena Saner ([id number]) -------------------------------------------------------------------------------- Multi-Disciplinary Care Plan Details Patient Name: Woodrum, Ameria C. Date of Service: 01/22/2022 12:30 PM Medical Record Number: [id number] Patient Account Number: [id number] Date of Birth/Sex: 1961-08-19 (61 y.o. F) Treating RN: Yevonne Pax Primary Care Aleighna Wojtas: [name] Other Clinician: Referring Thiago Ragsdale: Card, John Treating Jase Reep/Extender: Rowan Blase in Treatment: 14 Active Inactive Wound/Skin Impairment Nursing Diagnoses: Knowledge deficit related to ulceration/compromised skin integrity Goals: Patient/caregiver will verbalize understanding of skin care regimen Date Initiated: 10/15/2021 Target Resolution Date: 12/15/2021 Goal Status: Active Ulcer/skin breakdown will have a volume reduction of 30% by week 4 Date Initiated: 10/15/2021 Target Resolution Date: 12/15/2021 Goal Status: Active Ulcer/skin breakdown will have a volume  reduction of 50% by week 8 Date Initiated: 10/15/2021 Target Resolution Date: 01/15/2022 Goal Status: Active Ulcer/skin breakdown will have a volume reduction of 80% by week 12 Date Initiated: 10/15/2021 Target Resolution Date: 02/15/2022 Goal Status: Active Ulcer/skin breakdown will heal within 14 weeks Date Initiated: 10/15/2021 Target Resolution Date: 03/15/2022 Goal Status: Active Interventions: Assess patient/caregiver ability to obtain necessary supplies Assess patient/caregiver ability to perform ulcer/skin care regimen upon admission and as needed Assess ulceration(s) every visit Notes: Electronic Signature(s) Signed: 01/22/2022 1:59:18 PM By: Yevonne Pax RN Entered By: Yevonne Pax on 01/22/2022 13:11:50 Mahan, Al C. ([id number]) -------------------------------------------------------------------------------- Pain Assessment Details Patient Name: [name], [name] Date of Service: 01/22/2022 12:30 PM Medical Record Number: [id number] Patient Account Number: [id number] Date of Birth/Sex: Apr 28, 1961 (61 y.o. F) Treating RN: Yevonne Pax Primary Care Vadhir Mcnay: [name] Other Clinician: Referring Giulian Goldring: Card, John Treating Jolan Upchurch/Extender: Rowan Blase in Treatment: 14 Active Problems Location of Pain Severity and Description of Pain Patient Has Paino No Site Locations Pain Management and Medication Current Pain Management: Electronic Signature(s) Signed: 01/22/2022 1:59:18 PM By: Yevonne Pax RN Entered By: Yevonne Pax on 01/22/2022 12:44:49 Cowden, Kameisha Salena Saner ([id number]) -------------------------------------------------------------------------------- Patient/Caregiver Education Details Patient Name: [name], [name] Date of Service: 01/22/2022 12:30 PM Medical Record Number: [id number] Patient Account Number: [id number] Date of Birth/Gender: 1961-03-07 (61 y.o. F) Treating RN: Yevonne Pax Primary Care Physician: [name] Other  Clinician: Referring Physician: Card, John Treating Physician/Extender: Rowan Blase in Treatment: 14 Education Assessment Education Provided To: Patient Education Topics Provided Wound/Skin Impairment: Methods: Explain/Verbal Responses: State content correctly Electronic Signature(s) Signed: 01/22/2022 1:59:18 PM By: Yevonne Pax RN Entered By: Yevonne Pax on 01/22/2022 13:14:47 Maris, Johonna C. ([id number]) -------------------------------------------------------------------------------- Wound Assessment Details Patient Name: [name], [name] Date of Service: 01/22/2022 12:30 PM Medical Record Number: [id number] Patient Account Number: [id number] Date of Birth/Sex: 1961/02/21 (61 y.o. F) Treating RN: Yevonne Pax Primary Care Jatziry Wechter: Card, Jonny Ruiz Other Clinician: Referring Rissie Sculley: Card, John Treating Pamila Mendibles/Extender: Rowan Blase in Treatment: 14 Wound Status Wound Number: 1 Primary Etiology: Dehisced Wound Wound Location: Distal, Midline Back Wound Status: Open Wounding Event: Surgical Injury Comorbid History: Hypertension Date Acquired: 04/11/2021 Weeks Of Treatment: 14 Clustered Wound: No Photos Wound Measurements Length: (cm) 0.3 Width: (cm) 0.3 Depth: (cm) 3 Area: (cm) 0.071 Volume: (cm) 0.212 % Reduction in Area: 43.7% % Reduction in Volume: 63.3% Epithelialization: None Tunneling: No Undermining: No Wound Description Classification: Full Thickness Without  Exposed Support Structures Exudate Amount: Medium Exudate Type: Serous Exudate Color: amber Foul Odor After Cleansing: No Slough/Fibrino No Wound Bed Granulation Amount: Large (67-100%) Exposed Structure Granulation Quality: Red Fascia Exposed: No Necrotic Amount: None Present (0%) Fat Layer (Subcutaneous Tissue) Exposed: Yes Tendon Exposed: No Muscle Exposed: No Joint Exposed: No Bone Exposed: No Treatment Notes Wound #1 (Back) Wound Laterality: Midline,  Distal Cleanser Normal Saline Discharge Instruction: Wash your hands with soap and water. Remove old dressing, discard into plastic bag and place into trash. Cleanse the wound with Normal Saline prior to applying a clean dressing using gauze sponges, not tissues or cotton balls. Do not scrub or use excessive force. Pat dry using gauze sponges, not tissue or cotton balls. Lover, Shaneika C. ([id number]) Peri-Wound Care Topical Primary Dressing Hydrofera Blue Classic Foam Rope Dressing, 9x6 (mm/in) Discharge Instruction: cut rope in 1/4 lengthwise AND LEAVE A TAIL OUT TO GRAB IT Secondary Dressing Zetuvit Plus Silicone Border Dressing 4x4 (in/in) Secured With Compression Wrap Compression Stockings Add-Ons Electronic Signature(s) Signed: 01/22/2022 1:59:18 PM By: Yevonne Pax RN Entered By: Yevonne Pax on 01/22/2022 12:45:20 Alverio, Demari C. ([id number]) -------------------------------------------------------------------------------- Vitals Details Patient Name: [name], [name] Date of Service: 01/22/2022 12:30 PM Medical Record Number: [id number] Patient Account Number: [id number] Date of Birth/Sex: 11-08-1961 (61 y.o. F) Treating RN: Yevonne Pax Primary Care Kody Brandl: [name] Other Clinician: Referring Davanee Klinkner: Card, John Treating Kaz Auld/Extender: Rowan Blase in Treatment: 14 Vital Signs Time Taken: 12:44 Temperature (F): 98.3 Weight (lbs): 275 Pulse (bpm): 80 Respiratory Rate (breaths/min): 18 Blood Pressure (mmHg): 121/79 Reference Range: 80 - 120 mg / dl Electronic Signature(s) Signed: 01/22/2022 1:59:18 PM By: Yevonne Pax RN Entered By: Yevonne Pax on 01/22/2022 12:44:42

## 2022-01-24 ENCOUNTER — Other Ambulatory Visit: Payer: Self-pay

## 2022-01-24 DIAGNOSIS — T8131XA Disruption of external operation (surgical) wound, not elsewhere classified, initial encounter: Secondary | ICD-10-CM | POA: Diagnosis not present

## 2022-01-24 NOTE — Progress Notes (Signed)
DORA, SIMEONE ([id number]) Visit Report for 01/24/2022 Arrival Information Details Patient Name: [name], [name] Date of Service: 01/24/2022 3:30 PM Medical Record Number: [id number] Patient Account Number: [id number] Date of Birth/Sex: Feb 23, 1961 (61 y.o. F) Treating RN: Yevonne Pax Primary Care Layia Walla: [name] Other Clinician: Referring Ardis Lawley: Card, John Treating Tyler Cubit/Extender: Rowan Blase in Treatment: 14 Visit Information History Since Last Visit All ordered tests and consults were completed: No Patient Arrived: [name] Added or deleted any medications: No Arrival Time: 16:29 Any new allergies or adverse reactions: No Accompanied By: self Had a fall or experienced change in No Transfer Assistance: None activities of daily living that may affect Patient Identification Verified: Yes risk of falls: Secondary Verification Process Completed: Yes Signs or symptoms of abuse/neglect since last visito No Patient Requires Transmission-Based Precautions: No Hospitalized since last visit: No Patient Has Alerts: No Has Dressing in Place as Prescribed: Yes Pain Present Now: No Electronic Signature(s) Signed: 01/24/2022 4:39:49 PM By: Yevonne Pax RN Entered By: Yevonne Pax on 01/24/2022 16:39:48 Penrose, [name] ([id number]) -------------------------------------------------------------------------------- Clinic Level of Care Assessment Details Patient Name: [name], [name] Date of Service: 01/24/2022 3:30 PM Medical Record Number: [id number] Patient Account Number: [id number] Date of Birth/Sex: [birth year] (61 y.o. F) Treating RN: Yevonne Pax Primary Care Taquila Leys: Card, Jonny Ruiz Other Clinician: Referring Marykay Mccleod: Card, John Treating Jolena Kittle/Extender: Rowan Blase in Treatment: 14 Clinic Level of Care Assessment Items TOOL 4 Quantity Score X - Use when only an EandM is performed on FOLLOW-UP visit 1 0 ASSESSMENTS - Nursing Assessment /  Reassessment X - Reassessment of Co-morbidities (includes updates in patient status) 1 10 X- 1 5 Reassessment of Adherence to Treatment Plan ASSESSMENTS - Wound and Skin Assessment / Reassessment X - Simple Wound Assessment / Reassessment - one wound 1 5  - 0 Complex Wound Assessment / Reassessment - multiple wounds  - 0 Dermatologic / Skin Assessment (not related to wound area) ASSESSMENTS - Focused Assessment  - Circumferential Edema Measurements - multi extremities 0  - 0 Nutritional Assessment / Counseling / Intervention  - 0 Lower Extremity Assessment (monofilament, tuning fork, pulses)  - 0 Peripheral Arterial Disease Assessment (using hand held doppler) ASSESSMENTS - Ostomy and/or Continence Assessment and Care  - Incontinence Assessment and Management 0  - 0 Ostomy Care Assessment and Management (repouching, etc.) PROCESS - Coordination of Care X - Simple Patient / Family Education for ongoing care 1 15  - 0 Complex (extensive) Patient / Family Education for ongoing care  - 0 Staff obtains Chiropractor, Records, Test Results / Process Orders  - 0 Staff telephones HHA, Nursing Homes / Clarify orders / etc  - 0 Routine Transfer to another Facility (non-emergent condition)  - 0 Routine Hospital Admission (non-emergent condition)  - 0 New Admissions / Manufacturing engineer / Ordering NPWT, Apligraf, etc.  - 0 Emergency Hospital Admission (emergent condition) X- 1 10 Simple Discharge Coordination  - 0 Complex (extensive) Discharge Coordination PROCESS - Special Needs  - Pediatric / Minor Patient Management 0  - 0 Isolation Patient Management  - 0 Hearing / Language / Visual special needs  - 0 Assessment of Community assistance (transportation, D/C planning, etc.)  - 0 Additional assistance / Altered mentation  - 0 Support Surface(s) Assessment (bed, cushion, seat, etc.) INTERVENTIONS - Wound Cleansing /  Measurement [name], [name] ([id number]) X- 1 5 Simple Wound Cleansing - one wound  - 0 Complex Wound Cleansing - multiple wounds X- 1 5 Wound Imaging (photographs - any  number of wounds) []  - 0 Wound Tracing (instead of photographs) X- 1 5 Simple Wound Measurement - one wound []  - 0 Complex Wound Measurement - multiple wounds INTERVENTIONS - Wound Dressings X - Small Wound Dressing one or multiple wounds 1 10 []  - 0 Medium Wound Dressing one or multiple wounds []  - 0 Large Wound Dressing one or multiple wounds []  - 0 Application of Medications - topical []  - 0 Application of Medications - injection INTERVENTIONS - Miscellaneous []  - External ear exam 0 []  - 0 Specimen Collection (cultures, biopsies, blood, body fluids, etc.) []  - 0 Specimen(s) / Culture(s) sent or taken to Lab for analysis []  - 0 Patient Transfer (multiple staff / / Similar devices) []  - 0 Simple Staple / Suture removal (25 or less) []  - 0 Complex Staple / Suture removal (26 or more) []  - 0 Hypo / Hyperglycemic Management (close monitor of Blood Glucose) []  - 0 Ankle / Brachial Index (ABI) - do not check if billed separately X- 1 5 Vital Signs Has the patient been seen at the hospital within the last three years: Yes Total Score: 75 Level Of Care: New/Established - Level 2 Electronic Signature(s) Unsigned Entered By on 01/24/2022 16:43:32 Signature(s): Date(s): Abarca, Ingra C ( ) -------------------------------------------------------------------------------- Encounter Discharge Information Details Patient Name: [name], [name] Date of Service: 01/24/2022 3:30 PM Medical Record Number: Patient Account Number: Date of Birth/Sex: Mar 12, 1961 (61 y.o. F) Treating RN: Nurse, adult Primary Care Tayia Stonesifer: Other Clinician: Referring Annella Prowell: Card, John Treating Nisa Decaire/Extender: in Treatment:  14 Encounter Discharge Information Items Discharge Condition: Stable Ambulatory Status: Walker Discharge Destination: Home Transportation: Private Auto Accompanied By: self Schedule Follow-up Appointment: Yes Clinical Summary of Care: Patient Declined Electronic Signature(s) Signed: 01/24/2022 4:42:22 PM By: RN Entered By: Yevonne Pax on 01/24/2022 16:42:22 [name], [name] (Marland Kitchen) -------------------------------------------------------------------------------- Wound Assessment Details Patient Name: [name], [name] Date of Service: 01/24/2022 3:30 PM Medical Record Number: 01/26/2022 Patient Account Number: [id number] Date of Birth/Sex: 30-Nov-1961 (61 y.o. F) Treating RN: 68 Primary Care Crislyn Willbanks: Card, Yevonne Pax Other Clinician: Referring Melonee Gerstel: Card, John Treating Zayne Marovich/Extender: [name] in Treatment: 14 Wound Status Wound Number: 1 Primary Etiology: Dehisced Wound Wound Location: Distal, Midline Back Wound Status: Open Wounding Event: Surgical Injury Comorbid History: Hypertension Date Acquired: 04/11/2021 Weeks Of Treatment: 14 Clustered Wound: No Wound Measurements Length: (cm) 0.3 Width: (cm) 0.3 Depth: (cm) 3 Area: (cm) 0.071 Volume: (cm) 0.212 % Reduction in Area: 43.7% % Reduction in Volume: 63.3% Epithelialization: None Tunneling: No Undermining: No Wound Description Classification: Full Thickness Without Exposed Support Structu Exudate Amount: Medium Exudate Type: Serous Exudate Color: amber res Foul Odor After Cleansing: No Slough/Fibrino No Wound Bed Granulation Amount: Large (67-100%) Exposed Structure Granulation Quality: Red Fascia Exposed: No Necrotic Amount: None Present (0%) Fat Layer (Subcutaneous Tissue) Exposed: Yes Tendon Exposed: No Muscle Exposed: No Joint Exposed: No Bone Exposed: No Treatment Notes Wound #1 (Back) Wound Laterality: Midline, Distal Cleanser Normal Saline Discharge  Instruction: Wash your hands with soap and water. Remove old dressing, discard into plastic bag and place into trash. Cleanse the wound with Normal Saline prior to applying a clean dressing using gauze sponges, not tissues or cotton balls. Do not scrub or use excessive force. Pat dry using gauze sponges, not tissue or cotton balls. Peri-Wound Care Topical Primary Dressing Hydrofera Blue Classic Foam Rope Dressing, 9x6 (mm/in) Discharge Instruction: cut rope in 1/4 lengthwise AND LEAVE  A TAIL OUT TO GRAB IT Secondary Dressing Zetuvit Plus Silicone Border Dressing 4x4 (in/in) Secured With Compression Wrap Compression Stockings [name], [name] ([id number]) Add-Ons Electronic Signature(s) Signed: 01/24/2022 4:40:17 PM By: Yevonne Pax RN Entered By: Yevonne Pax on 01/24/2022 16:40:17

## 2022-01-25 NOTE — Progress Notes (Signed)
FARYAL, MARXEN ([id number]) Visit Report for 01/24/2022 Physician Orders Details Patient Name: [name], [name] Date of Service: 01/24/2022 3:30 PM Medical Record Number: [id number] Patient Account Number: [id number] Date of Birth/Sex: 1961/11/12 (61 y.o. F) Treating RN: Yevonne Pax Primary Care Provider: [name] Other Clinician: Referring Provider: Card, John Treating Provider/Extender: Rowan Blase in Treatment: 14 Verbal / Phone Orders: No Diagnosis Coding Follow-up Appointments o Return Appointment in 1 week. o Nurse Visit as needed - nurse visit on tuesday and thursday Home Health o Home Health Company: [facility] o [facility] for wound care. May utilize formulary equivalent dressing for wound treatment orders unless otherwise specified. Home Health Nurse may visit PRN to address patientos wound care needs. - 3 times per week [facility] fax [phone] Bathing/ Shower/ Hygiene o May shower; gently cleanse wound with antibacterial soap, rinse and pat dry prior to dressing wounds o No tub bath. Anesthetic (Use 'Patient Medications' Section for Anesthetic Order Entry) o Lidocaine applied to wound bed Edema Control - Lymphedema / Segmental Compressive Device / Other o Elevate, Exercise Daily and Avoid Standing for Long Periods of Time. o Elevate legs to the level of the heart and pump ankles as often as possible o Elevate leg(s) parallel to the floor when sitting. Additional Orders / Instructions o Follow Nutritious Diet and Increase Protein Intake Wound Treatment Wound #1 - Back Wound Laterality: Midline, Distal Cleanser: Normal Saline 1 x Per Day/30 Days Discharge Instructions: Wash your hands with soap and water. Remove old dressing, discard into plastic bag and place into trash. Cleanse the wound with Normal Saline prior to applying a clean dressing using gauze sponges, not tissues or cotton balls. Do not scrub or use excessive force.  Pat dry using gauze sponges, not tissue or cotton balls. Primary Dressing: Hydrofera Blue Classic Foam Rope Dressing, 9x6 (mm/in) 1 x Per Day/30 Days Discharge Instructions: cut rope in 1/4 lengthwise AND LEAVE A TAIL OUT TO GRAB IT Secondary Dressing: Zetuvit Plus Silicone Border Dressing 4x4 (in/in) 1 x Per Day/30 Days Electronic Signature(s) Signed: 01/24/2022 4:41:49 PM By: Yevonne Pax RN Signed: 01/24/2022 6:09:14 PM By: [name] PA-C Entered By: Yevonne Pax on 01/24/2022 16:41:49 [name], [name] ([id number]) -------------------------------------------------------------------------------- SuperBill Details Patient Name: [name], [name] Date of Service: 01/24/2022 Medical Record Number: [id number] Patient Account Number: [id number] Date of Birth/Sex: February 17, 1961 (61 y.o. 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

## 2022-01-29 ENCOUNTER — Encounter: Payer: 59 | Admitting: Physician Assistant

## 2022-01-29 ENCOUNTER — Other Ambulatory Visit: Payer: Self-pay

## 2022-01-29 DIAGNOSIS — T8131XA Disruption of external operation (surgical) wound, not elsewhere classified, initial encounter: Secondary | ICD-10-CM | POA: Diagnosis not present

## 2022-01-29 NOTE — Progress Notes (Addendum)
[name], [name] ([id number]) Visit Report for 01/29/2022 Chief Complaint Document Details Patient Name: [name], [name] Date of Service: 01/29/2022 12:30 PM Medical Record Number: [id number] Patient Account Number: [id number] Date of Birth/Sex: July 20, 1961 (61 y.o. F) Treating RN: [name] Primary Care Provider: Clayborn [name] Other Clinician: Referring Provider: Card, [name] Treating Provider/Extender: [name] in Treatment: 15 Information Obtained from: Patient Chief Complaint Surgical Back Ulcer Electronic Signature(s) Signed: 01/29/2022 12:53:28 PM By: [name] Keeler PA-C Entered By: [name] on 01/29/2022 12:53:28 [name], [name] ([id number]) -------------------------------------------------------------------------------- HPI Details Patient Name: [name], [name] Date of Service: 01/29/2022 12:30 PM Medical Record Number: [id number] Patient Account Number: [id number] Date of Birth/Sex: Dec 22, 1961 (61 y.o. F) Treating RN: [name] Primary Care Provider: Clayborn [name] Other Clinician: Referring Provider: Card, [name] Treating Provider/Extender: [name] in Treatment: 15 History of Present Illness HPI Description: 10/15/2021 upon evaluation today patient presents for initial evaluation here in the clinic concerning a surgical ulceration/dehiscence in the lumbar spine region following surgery that she had over the past year. This was actually broken up into 3 separate surgical events. The initial surgical intervention actually was on November 05, 2021 almost a year ago. Subsequently the patient went back in February for a seroma of the area which unfortunately required her to have a repeat surgery to go in and clean this out. And then again this occurred in April where she went back in and again they felt like stitches were coming out and there was an additional seroma. She was placed in a wound VAC initially and then subsequently as it got smaller that  was discontinued. Again right now I will see anything that I think a wound VAC would help with. Nonetheless she definitely has a significant depth to the wound that is going require packing. I actually believe the Hydrofera Blue rope would probably do quite well with this the problem is as much as it is draining she probably needs this to be changed at least every day. She does not really have anyone that can help with that that is the complicating scenario here. With that being said the patient does have a history of multiple sclerosis, hypertension, and again this surgical wound dehiscence in regard to her lumbar spine region. She did have a repeat MRI which was actually completed 10/09/2021. This showed that there was no significant change in the subcutaneous fluid collection/track of the lower lumbar region. This is extending to the level of the fascia unfortunately. This seems to go all the way from the L2 level with a track extending all the way to the fascia at the L4-5 level. Again this is a significant wound and there is significant drainage but does not seem to communicate to the spinal region as far as spinal fluid or otherwise is concerned that is good news. Nonetheless she last saw [name] who is her neurosurgeon on 10/01/2021 that was when he ordered this last MRI she supposed to see him next week as well. With that being said he did not feel like there was any significant issue there but was not sure why this was not healing that is when he ordered the MRI. They were wanting to make sure that this was packed appropriately by home health unfortunately the main issue currently is that home health is completely out of the picture as the patient has exhausted all the home health that that she gets for a year. She is now in a very difficult  predicament where she does not have anyone to help her change the dressing and to be honest that she is not able to do it herself with the location of  the wound being on the midline lumbar spine region. If she does not have anyone that can help it is probably can to be necessary for her to go to a facility for rehab and daily dressing changes as I feel like daily changes which is much drainage that she is having is going to be necessary. 10/23/2021 upon evaluation today patient appears to be doing decently well in regard to her back ulcer. This does seem to be draining a lot less than what it is been doing in the past. With that being said she still has quite a bit of drainage nonetheless. I do think that given time this should improve least I hope so. The good news is she does have home health coming out 3 days a week were doing it 2 days a week and she is paying someone we can to help. 10/30/2021 upon evaluation today patient appears to be doing okay in regard to her back ulcer this is not draining quite as bad as it was in the beginning but he still has quite a bit of drainage noted. I do believe that the patient would benefit from [place] going ahead forward with attempting a wound VAC using the Hydrofera Blue rope to pack with and then subsequently using the VAC externally to actually suction out and help this to fill- in. I think this is our ideal way to try to get things cleared at this point. As it stands I am not certain that we are really making a progress that we want to see near with doing it in the way we are which is packing with the rope. It is a good dressing but I do think it is insufficient for total healing. She just seems to have too much in the way of drainage at this point unfortunately. 11/06/2021 upon evaluation today patient unfortunately continues to have issues with her back ongoing. The good news is her MRI that was repeated showed signs of the size of this area in the lumbar spine region having decreased from 4 cm to 3.5 cm this is definitely not bad news at all. With that being said unfortunately she continues to have issues  with ongoing drainage not as severe as in the beginning but nonetheless still significant. I do think a wound VAC still would be a good way to go although her home health agency nurse apparently has some concerns about the possibility of not being able to keep a seal with this as they had struggles in the past. Nonetheless I explained to the patient that this is much different than what she had previous and that I really feel like it would do much better as far as getting the area taken care of without having any complications or issues here. I think that we should be able to maintain a seal. Nonetheless at this time I did discuss with the patient as well that she probably does need to have a wound VAC in order for [place] to get this moving in the right direction. 11/13/2021 upon evaluation patient's wound bed actually showed signs of significant drainage at this time. She did see the surgeon yesterday he did not see anything that appeared to be infected. Nonetheless he does appear that she is continuing to have areas here that just do not seem  to want to seal up there MRI findings have been negative but nonetheless she continues to have is the seroma that is filling in. I do feel like we need to try to widen the hole so we can get at least a half of the [facility] then this will be better than nothing at this point. 11/20/2021 upon evaluation today patient actually appears to be having less pain at this point which is good news and overall she we still do not have the results of the culture back yet it had to be sent out to [place] and we do not have the result back yet. Is doing decently well in regard to her wound. Fortunately there does not appear to be any signs of active infection systemically nor locally at this time. 11/27/2021 upon evaluation today patient appears to be doing well with regard to her wound all things considered there does appear to be less drainage than there was previous.  Fortunately I do not see any evidence of worsening of the patient is stating that she is having some issues with back pain. This is somewhat new. Again this I think could be related to the fact that she is having some issues here with infection. We are still waiting to see what the result of her culture shows from susceptibility testing Enterococcus has been identified but we do not know if this is VRE or not. 12/04/2021 upon evaluation today patient appears to be doing well with regard to her wound all things considered. I did have a conversation with Erin from Dr. Lacy Duverney office. Of note she notes that Dr. Joetta Manners really does not want to do anything surgical right now which I completely understand. With that being said I am still leery of how far we will make it getting this to heal short of any type of surgery to open this up and allow [place] to more appropriately packed the wound. Nonetheless I will absolutely give it our best shot as far as that is concerned. I discussed that with the patient today. She voiced understanding. The good news is the drainage today seems to be clear it is no longer cloudy as it was previous I am actually very pleased in that regard. CRIMSON, KALICH ([id number]) 12/11/2021 since have last seen the patient actually did have an conversation with [name] with her neurosurgeon. Again this involve the discussion around whether or not to open the wound and try to apply a wound VAC following. With that being said the decision was made that that may be the best thing to do if the patient was in agreement. Nonetheless I am actually extremely encouraged with what I am seeing today much more than I would have thought. In fact I think that we may be over packing the wound which is why she is having some discomfort at this point as there is much less of the dressing able to get and this time compared to what we saw previous. That is actually really good news. In fact the 6:00  tunnel appears to have filled in is awesome news. At the 12:00 location I am actually able to pack into that area pretty effectively at this point today. In fact I was able to get less of the packing in which I will detail below. 12/18/2021 upon evaluation today patient appears to be doing well with regard to her wound on the back. Fortunately there is no signs of active infection which is great news and overall  very pleased with where things stand today. No fevers, chills, nausea, vomiting, or diarrhea. 01/01/2022 upon evaluation today patient appears to be doing decently well in regard to her wound. Fortunately there does not appear to be any signs of anything worsening which is great news. She still continues to have issues with drainage but this is not nearly as significant as what it was in the past. There is all clear drainage no signs of purulence noted. 01/08/2022 upon evaluation today patient appears to be doing well with regard to her wound. With that being said she tells me that she has been having some increased pain over the past week. It does appear based on the amount of Hydrofera Blue that we removed this is probably over pack. Again it is difficult to know exactly how old the nurses getting all the skin but nonetheless the length of Hydrofera Blue that was utilized was way too much which may be part of the reason why this felt so uncomfortable. Fortunately I think that we can cut back on that we discussed pieces smaller so hopefully that will not be over packed. 01/22/2021 upon evaluation today patient appears to be doing well With regard to there being no signs of infection at this time. The wound does still tunnel mainly up at the 12:00 location. The depth of the tunnel complete from entry point to the base is about 3.5 cm today. With that being said I do believe that overall she is making good progress this is just a very slow process for her which I know has been frustrating as  well. 01/29/2022 upon evaluation today patient appears to be doing well with regard to the wound on her lower back and the lumbar spine region. The good news is the depth that I got this week was a little bit less going up at the 12:00 location as compared to last week. I measured this to be 3.5 cm last week and it was 3.3 cm this week. Again that is a minute shift but nonetheless a good shift in the right direction. Overall I think that we are on the right track. Electronic Signature(s) Signed: 01/29/2022 1:14:59 PM By: [name] Keeler PA-C Entered By: [name] on 01/29/2022 13:14:59 [name], [name] Farr ([id number]) -------------------------------------------------------------------------------- Physical Exam Details Patient Name: [name], [name] Date of Service: 01/29/2022 12:30 PM Medical Record Number: [id number] Patient Account Number: [id number] Date of Birth/Sex: [birth year] (61 y.o. F) Treating RN: [name] Primary Care Provider: Clayborn [name] Other Clinician: Referring Provider: Card, [name] Treating Provider/Extender: [name] in Treatment: 15 Constitutional Obese and well-hydrated in no acute distress. Respiratory normal breathing without difficulty. Psychiatric this patient is able to make decisions and demonstrates good insight into disease process. Alert and Oriented x 3. pleasant and cooperative. Notes Upon evaluation today patient's wound bed showed signs of good improvement internally. Again I cannot visually see the wound bed but from the edge that I can see there does appear to be some good granulation at this point. I am very pleased in that regard. Otherwise I think that she is definitely making progress here is just very slow. Electronic Signature(s) Signed: 01/29/2022 1:15:24 PM By: [name] Keeler PA-C Entered By: [name] on 01/29/2022 13:15:24 [name], [name] Farr  ([id number]) -------------------------------------------------------------------------------- Physician Orders Details Patient Name: [name], [name] Date of Service: 01/29/2022 12:30 PM Medical Record Number: [id number] Patient Account Number: [id number] Date of Birth/Sex: 1961-11-11 (61 y.o. 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 o Nurse Visit as needed - nurse visit on tuesday and Friend: - [place] for wound care. May utilize formulary equivalent dressing for wound treatment orders unless otherwise specified. Home Health Nurse may visit PRN to address patientos wound care needs. - 3 times per week [facility] fax [phone] Bathing/ Shower/ Hygiene o May shower; gently cleanse wound with antibacterial soap, rinse and pat dry prior to dressing wounds o No tub bath. Anesthetic (Use 'Patient Medications' Section for Anesthetic Order Entry) o Lidocaine applied to wound bed Edema Control - Lymphedema / Segmental Compressive Device / Other o Elevate, Exercise Daily and Avoid Standing for Long Periods of Time. o Elevate legs to the level of the heart and pump ankles as often as possible o Elevate leg(s) parallel to the floor when sitting. Additional Orders / Instructions o Follow Nutritious Diet and Increase Protein Intake Wound Treatment Wound #1 - Back Wound Laterality: Midline, Distal Cleanser: Normal Saline 1 x Per  Day/30 Days Discharge Instructions: Wash your hands with soap and water. Remove old dressing, discard into plastic bag and place into trash. Cleanse the wound with Normal Saline prior to applying a clean dressing using gauze sponges, not tissues or cotton balls. Do not scrub or use excessive force. Pat dry using gauze sponges, not tissue or cotton balls. Primary Dressing: Hydrofera Blue Classic Foam Rope Dressing, 9x6 (mm/in) 1 x Per Day/30 Days Discharge Instructions: cut rope in 1/4 lengthwise AND LEAVE A TAIL OUT TO GRAB IT Secondary Dressing: Zetuvit Plus Silicone Border Dressing 4x4 (in/in) 1 x Per Day/30 Days Electronic Signature(s) Signed: 01/29/2022 1:13:16 PM By: [name] Coria RN Signed: 01/29/2022 5:15:34 PM By: [name] Keeler PA-C Entered By: [name] on 01/29/2022 13:13:16 [name], [name] ([id number]) -------------------------------------------------------------------------------- Problem List Details Patient Name: [name], [name] Date of Service: 01/29/2022 12:30 PM Medical Record Number: [id number] Patient Account Number: [id number] Date of Birth/Sex: 07/30/1961 (61 y.o. 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  ([id number]) -------------------------------------------------------------------------------- Progress Note Details Patient Name: [name], [name] Date of Service: 01/29/2022 12:30 PM Medical Record Number: [id number] Patient Account Number: [id number] Date of Birth/Sex: 07/15/1961 (61 y.o. F) Treating RN: [name] Primary Care Provider: Clayborn [name] Other Clinician: Referring Provider: Card, [name] Treating Provider/Extender: [name] in Treatment: 15 Subjective Chief Complaint Information obtained from Patient Surgical Back Ulcer History of Present Illness (HPI) 10/15/2021 upon evaluation today patient presents for initial evaluation here in the clinic concerning a surgical ulceration/dehiscence in the lumbar spine region following surgery that she had over  the past year. This was actually broken up into 3 separate surgical events. The initial surgical intervention actually was on November 05, 2021 almost a year ago. Subsequently the patient went back in February for a seroma of the area which unfortunately required her to have a repeat surgery to go in and clean this out. And then again this occurred in April where she went back in and again they felt like stitches were coming out and there was an additional seroma. She was placed in a wound VAC initially and then subsequently as it got smaller that was discontinued. Again right now I will see anything that I think a wound VAC would help with. Nonetheless she definitely has a significant depth to the wound that is going require packing. I actually believe the Hydrofera Blue rope would probably do quite well with this the problem is as much as it is draining she probably needs this to be changed at least every day. She does not really have anyone that can help with that that is the complicating scenario here. With that being said the patient does have a history of multiple sclerosis, hypertension, and again this surgical wound  dehiscence in regard to her lumbar spine region. She did have a repeat MRI which was actually completed 10/09/2021. This showed that there was no significant change in the subcutaneous fluid collection/track of the lower lumbar region. This is extending to the level of the fascia unfortunately. This seems to go all the way from the L2 level with a track extending all the way to the fascia at the L4-5 level. Again this is a significant wound and there is significant drainage but does not seem to communicate to the spinal region as far as spinal fluid or otherwise is concerned that is good news. Nonetheless she last saw [name] who is her neurosurgeon on 10/01/2021 that was when he ordered this last MRI she supposed to see him next week as well. With that being said he did not feel like there was any significant issue there but was not sure why this was not healing that is when he ordered the MRI. They were wanting to make sure that this was packed appropriately by home health unfortunately the main issue currently is that home health is completely out of the picture as the patient has exhausted all the home health that that she gets for a year. She is now in a very difficult predicament where she does not have anyone to help her change the dressing and to be honest that she is not able to do it herself with the location of the wound being on the midline lumbar spine region. If she does not have anyone that can help it is probably can to be necessary for her to go to a facility for rehab and daily dressing changes as I feel like daily changes which is much drainage that she is having is going to be necessary. 10/23/2021 upon evaluation today patient appears to be doing decently well in regard to her back ulcer. This does seem to be draining a lot less than what it is been doing in the past. With that being said she still has quite a bit of drainage nonetheless. I do think that given time this  should improve least I hope so. The good news is she does have home health coming out 3 days a week were doing it 2 days a week and she is paying someone we can to  help. 10/30/2021 upon evaluation today patient appears to be doing okay in regard to her back ulcer this is not draining quite as bad as it was in the beginning but he still has quite a bit of drainage noted. I do believe that the patient would benefit from [place] going ahead forward with attempting a wound VAC using the Hydrofera Blue rope to pack with and then subsequently using the VAC externally to actually suction out and help this to fill- in. I think this is our ideal way to try to get things cleared at this point. As it stands I am not certain that we are really making a progress that we want to see near with doing it in the way we are which is packing with the rope. It is a good dressing but I do think it is insufficient for total healing. She just seems to have too much in the way of drainage at this point unfortunately. 11/06/2021 upon evaluation today patient unfortunately continues to have issues with her back ongoing. The good news is her MRI that was repeated showed signs of the size of this area in the lumbar spine region having decreased from 4 cm to 3.5 cm this is definitely not bad news at all. With that being said unfortunately she continues to have issues with ongoing drainage not as severe as in the beginning but nonetheless still significant. I do think a wound VAC still would be a good way to go although her home health agency nurse apparently has some concerns about the possibility of not being able to keep a seal with this as they had struggles in the past. Nonetheless I explained to the patient that this is much different than what she had previous and that I really feel like it would do much better as far as getting the area taken care of without having any complications or issues here. I think that we should be able  to maintain a seal. Nonetheless at this time I did discuss with the patient as well that she probably does need to have a wound VAC in order for [place] to get this moving in the right direction. 11/13/2021 upon evaluation patient's wound bed actually showed signs of significant drainage at this time. She did see the surgeon yesterday he did not see anything that appeared to be infected. Nonetheless he does appear that she is continuing to have areas here that just do not seem to want to seal up there MRI findings have been negative but nonetheless she continues to have is the seroma that is filling in. I do feel like we need to try to widen the hole so we can get at least a half of the [facility] then this will be better than nothing at this point. 11/20/2021 upon evaluation today patient actually appears to be having less pain at this point which is good news and overall she we still do not have the results of the culture back yet it had to be sent out to [place] and we do not have the result back yet. Is doing decently well in regard to her wound. Fortunately there does not appear to be any signs of active infection systemically nor locally at this time. 11/27/2021 upon evaluation today patient appears to be doing well with regard to her wound all things considered there does appear to be less drainage than there was previous. Fortunately I do not see any evidence of worsening of the patient is stating  that she is having some issues with back pain. This is somewhat new. Again this I think could be related to the fact that she is having some issues here with infection. We are still waiting to see what the result of her culture shows from susceptibility testing Enterococcus has been identified but we do not know if this is VRE or not. 12/04/2021 upon evaluation today patient appears to be doing well with regard to her wound all things considered. I did have a conversation with Erin from Dr. Lacy Duverney  office. Of note she notes that Dr. Joetta Manners really does not want to do anything surgical right now which I completely [place], Darleen C. ([id number]) understand. With that being said I am still leery of how far we will make it getting this to heal short of any type of surgery to open this up and allow [place] to more appropriately packed the wound. Nonetheless I will absolutely give it our best shot as far as that is concerned. I discussed that with the patient today. She voiced understanding. The good news is the drainage today seems to be clear it is no longer cloudy as it was previous I am actually very pleased in that regard. 12/11/2021 since have last seen the patient actually did have an conversation with [name] with her neurosurgeon. Again this involve the discussion around whether or not to open the wound and try to apply a wound VAC following. With that being said the decision was made that that may be the best thing to do if the patient was in agreement. Nonetheless I am actually extremely encouraged with what I am seeing today much more than I would have thought. In fact I think that we may be over packing the wound which is why she is having some discomfort at this point as there is much less of the dressing able to get and this time compared to what we saw previous. That is actually really good news. In fact the 6:00 tunnel appears to have filled in is awesome news. At the 12:00 location I am actually able to pack into that area pretty effectively at this point today. In fact I was able to get less of the packing in which I will detail below. 12/18/2021 upon evaluation today patient appears to be doing well with regard to her wound on the back. Fortunately there is no signs of active infection which is great news and overall very pleased with where things stand today. No fevers, chills, nausea, vomiting, or diarrhea. 01/01/2022 upon evaluation today patient appears to be doing decently well  in regard to her wound. Fortunately there does not appear to be any signs of anything worsening which is great news. She still continues to have issues with drainage but this is not nearly as significant as what it was in the past. There is all clear drainage no signs of purulence noted. 01/08/2022 upon evaluation today patient appears to be doing well with regard to her wound. With that being said she tells me that she has been having some increased pain over the past week. It does appear based on the amount of Hydrofera Blue that we removed this is probably over pack. Again it is difficult to know exactly how old the nurses getting all the skin but nonetheless the length of Hydrofera Blue that was utilized was way too much which may be part of the reason why this felt so uncomfortable. Fortunately I think that we can cut back  on that we discussed pieces smaller so hopefully that will not be over packed. 01/22/2021 upon evaluation today patient appears to be doing well With regard to there being no signs of infection at this time. The wound does still tunnel mainly up at the 12:00 location. The depth of the tunnel complete from entry point to the base is about 3.5 cm today. With that being said I do believe that overall she is making good progress this is just a very slow process for her which I know has been frustrating as well. 01/29/2022 upon evaluation today patient appears to be doing well with regard to the wound on her lower back and the lumbar spine region. The good news is the depth that I got this week was a little bit less going up at the 12:00 location as compared to last week. I measured this to be 3.5 cm last week and it was 3.3 cm this week. Again that is a minute shift but nonetheless a good shift in the right direction. Overall I think that we are on the right track. Objective Constitutional Obese and well-hydrated in no acute distress. Vitals Time Taken: 12:39 PM, Weight: 275 lbs,  Temperature: 98.1 F, Pulse: 85 bpm, Respiratory Rate: 20 breaths/min, Blood Pressure: 139/83 mmHg. Respiratory normal breathing without difficulty. Psychiatric this patient is able to make decisions and demonstrates good insight into disease process. Alert and Oriented x 3. pleasant and cooperative. General Notes: Upon evaluation today patient's wound bed showed signs of good improvement internally. Again I cannot visually see the wound bed but from the edge that I can see there does appear to be some good granulation at this point. I am very pleased in that regard. Otherwise I think that she is definitely making progress here is just very slow. Integumentary (Hair, Skin) Wound #1 status is Open. Original cause of wound was Surgical Injury. The date acquired was: 04/11/2021. The wound has been in treatment 15 weeks. The wound is located on the [place] Back. The wound measures 0.3cm length x 0.3cm width x 2.7cm depth; 0.071cm^2 area and 0.191cm^3 volume. There is Fat Layer (Subcutaneous Tissue) exposed. There is no tunneling or undermining noted. There is a medium amount of serous drainage noted. There is large (67-100%) red granulation within the wound bed. There is no necrotic tissue within the wound bed. Assessment Active Problems ICD-10 [name], [name] 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 Nurse Visit as needed - nurse visit on tuesday and [place]: Adrian: - [place] for wound care. May utilize formulary equivalent dressing for wound treatment orders unless otherwise specified. Home Health Nurse may visit [facility] to address patient s wound care needs. - 3 times per week [facility] fax  [phone] Bathing/ Shower/ Hygiene: May shower; gently cleanse wound with antibacterial soap, rinse and pat dry prior to dressing wounds No tub bath. Anesthetic (Use 'Patient Medications' Section for Anesthetic Order Entry): Lidocaine applied to wound bed Edema Control - Lymphedema / Segmental Compressive Device / Other: Elevate, Exercise Daily and Avoid Standing for Long Periods of Time. Elevate legs to the level of the heart and pump ankles as often as possible Elevate leg(s) parallel to the floor when sitting. Additional Orders / Instructions: Follow Nutritious Diet and Increase Protein Intake WOUND #1: - Back Wound Laterality: Midline,  Distal Cleanser: Normal Saline 1 x Per Day/30 Days Discharge Instructions: Wash your hands with soap and water. Remove old dressing, discard into plastic bag and place into trash. Cleanse the wound with Normal Saline prior to applying a clean dressing using gauze sponges, not tissues or cotton balls. Do not scrub or use excessive force. Pat dry using gauze sponges, not tissue or cotton balls. Primary Dressing: Hydrofera Blue Classic Foam Rope Dressing, 9x6 (mm/in) 1 x Per Day/30 Days Discharge Instructions: cut rope in 1/4 lengthwise AND LEAVE A TAIL OUT TO GRAB IT Secondary Dressing: Zetuvit Plus Silicone Border Dressing 4x4 (in/in) 1 x Per Day/30 Days 1. I Minna recommend that we going continue with the [facility] Blue rope which I think is doing a good job. We will see how this continues to progress over the next several weeks. 2. I am also can recommend that we continue with the border foam dressing to cover which is definitely providing some ability for this to drain into as well as some padding and cushioning to the region. We will see patient back for reevaluation in 1 week here in the clinic. If anything worsens or changes patient will contact our office for additional recommendations. Electronic Signature(s) Signed: 01/29/2022 1:15:50 PM By:  [name] Keeler PA-C Entered By: [name] on 01/29/2022 13:15:49 [place], [name] ([id number]) -------------------------------------------------------------------------------- SuperBill Details Patient Name: Cagley, Miri C. Date of Service: 01/29/2022 Medical Record Number: [id number] Patient Account Number: [id number] Date of Birth/Sex: 11-Dec-1961 (61 y.o. 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 G35 Multiple sclerosis I10 Essential (primary) hypertension Facility Procedures CPT4 Code: [id number] Description: [phone] - WOUND CARE VISIT-LEV 2 EST PT Modifier: Quantity: 1 Physician Procedures CPT4 Code: [id number] Description: [id number] - WC PHYS LEVEL 4 - EST PT Modifier: Quantity: 1 CPT4 Code: Description: ICD-10 Diagnosis Description T81.31XA Disruption of external operation (surgical) wound, not elsewhere classifi 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

## 2022-01-30 NOTE — Progress Notes (Signed)
ALA, KRATZ ([id number]) Visit Report for 01/29/2022 Arrival Information Details Patient Name: [name], [name] Date of Service: 01/29/2022 12:30 PM Medical Record Number: [id number] Patient Account Number: [id number] Date of Birth/Sex: March 08, 1961 (61 y.o. F) Treating RN: Yevonne Pax Primary Care Kimani Bedoya: Card, Jonny Ruiz Other Clinician: Referring Bentlee Drier: Card, John Treating Aracely Rickett/Extender: Rowan Blase in Treatment: 15 Visit Information History Since Last Visit All ordered tests and consults were completed: No Patient Arrived: [name] Added or deleted any medications: No Arrival Time: 12:34 Any new allergies or adverse reactions: No Accompanied By: self Had a fall or experienced change in No Transfer Assistance: None activities of daily living that may affect Patient Identification Verified: Yes risk of falls: Secondary Verification Process Completed: Yes Signs or symptoms of abuse/neglect since last visito No Patient Requires Transmission-Based Precautions: No Hospitalized since last visit: No Patient Has Alerts: No Implantable device outside of the clinic excluding No cellular tissue based products placed in the center since last visit: Has Dressing in Place as Prescribed: Yes Pain Present Now: Yes Electronic Signature(s) Signed: 01/30/2022 1:16:38 PM By: Yevonne Pax RN Entered By: Yevonne Pax on 01/29/2022 12:39:55 [name], [name] ([id number]) -------------------------------------------------------------------------------- Clinic Level of Care Assessment Details Patient Name: [name], [name] Date of Service: 01/29/2022 12:30 PM Medical Record Number: [id number] Patient Account Number: [id number] Date of Birth/Sex: Feb 28, 1961 (61 y.o. F) Treating RN: Yevonne Pax Primary Care Marchella Hibbard: Card, Jonny Ruiz Other Clinician: Referring Kieran Arreguin: Card, John Treating Yareli Carthen/Extender: Rowan Blase in Treatment: 15 Clinic Level of Care Assessment Items TOOL  4 Quantity Score X - Use when only an EandM is performed on FOLLOW-UP visit 1 0 ASSESSMENTS - Nursing Assessment / Reassessment X - Reassessment of Co-morbidities (includes updates in patient status) 1 10 X- 1 5 Reassessment of Adherence to Treatment Plan ASSESSMENTS - Wound and Skin Assessment / Reassessment X - Simple Wound Assessment / Reassessment - one wound 1 5 []  - 0 Complex Wound Assessment / Reassessment - multiple wounds []  - 0 Dermatologic / Skin Assessment (not related to wound area) ASSESSMENTS - Focused Assessment []  - Circumferential Edema Measurements - multi extremities 0 []  - 0 Nutritional Assessment / Counseling / Intervention []  - 0 Lower Extremity Assessment (monofilament, tuning fork, pulses) []  - 0 Peripheral Arterial Disease Assessment (using hand held doppler) ASSESSMENTS - Ostomy and/or Continence Assessment and Care []  - Incontinence Assessment and Management 0 []  - 0 Ostomy Care Assessment and Management (repouching, etc.) PROCESS - Coordination of Care X - Simple Patient / Family Education for ongoing care 1 15 []  - 0 Complex (extensive) Patient / Family Education for ongoing care []  - 0 Staff obtains , Records, Test Results / Process Orders []  - 0 Staff telephones HHA, Nursing Homes / Clarify orders / etc []  - 0 Routine Transfer to another Facility (non-emergent condition) []  - 0 Routine Hospital Admission (non-emergent condition) []  - 0 New Admissions / / Ordering NPWT, Apligraf, etc. []  - 0 Emergency Hospital Admission (emergent condition) X- 1 10 Simple Discharge Coordination []  - 0 Complex (extensive) Discharge Coordination PROCESS - Special Needs []  - Pediatric / Minor Patient Management 0 []  - 0 Isolation Patient Management []  - 0 Hearing / Language / Visual special needs []  - 0 Assessment of Community assistance (transportation, D/C planning, etc.) []  - 0 Additional assistance / Altered  mentation []  - 0 Support Surface(s) Assessment (bed, cushion, seat, etc.) INTERVENTIONS - Wound Cleansing / Measurement Pennock, Taffy C. ( ) X- 1 5 Simple Wound Cleansing -  one wound  - 0 Complex Wound Cleansing - multiple wounds X- 1 5 Wound Imaging (photographs - any number of wounds)  - 0 Wound Tracing (instead of photographs) X- 1 5 Simple Wound Measurement - one wound  - 0 Complex Wound Measurement - multiple wounds INTERVENTIONS - Wound Dressings X - Small Wound Dressing one or multiple wounds 1 10  - 0 Medium Wound Dressing one or multiple wounds  - 0 Large Wound Dressing one or multiple wounds  - 0 Application of Medications - topical  - 0 Application of Medications - injection INTERVENTIONS - Miscellaneous  - External ear exam 0  - 0 Specimen Collection (cultures, biopsies, blood, body fluids, etc.)  - 0 Specimen(s) / Culture(s) sent or taken to Lab for analysis  - 0 Patient Transfer (multiple staff / Nurse, adult / Similar devices)  - 0 Simple Staple / Suture removal (25 or less)  - 0 Complex Staple / Suture removal (26 or more)  - 0 Hypo / Hyperglycemic Management (close monitor of Blood Glucose)  - 0 Ankle / Brachial Index (ABI) - do not check if billed separately X- 1 5 Vital Signs Has the patient been seen at the hospital within the last three years: Yes Total Score: 75 Level Of Care: New/Established - Level 2 Electronic Signature(s) Signed: 01/30/2022 1:16:38 PM By: Yevonne Pax RN Entered By: Yevonne Pax on 01/29/2022 13:13:50 [name], [name] ([id number]) -------------------------------------------------------------------------------- Encounter Discharge Information Details Patient Name: [name], [name] Date of Service: 01/29/2022 12:30 PM Medical Record Number: [id number] Patient Account Number: [id number] Date of Birth/Sex: Mar 07, 1961 (61 y.o. F) Treating RN: Yevonne Pax Primary Care Tal Kempker:  [name] Other Clinician: Referring Evalyn Shultis: Card, John Treating Rhapsody Wolven/Extender: Rowan Blase in Treatment: 15 Encounter Discharge Information Items Discharge Condition: Stable Ambulatory Status: Walker Discharge Destination: Home Transportation: Private Auto Accompanied By: self Schedule Follow-up Appointment: Yes Clinical Summary of Care: Patient Declined Electronic Signature(s) Signed: 01/29/2022 1:14:47 PM By: Yevonne Pax RN Entered By: Yevonne Pax on 01/29/2022 13:14:47 [name], [name] ([id number]) -------------------------------------------------------------------------------- Lower Extremity Assessment Details Patient Name: Scadden, Neoma C. Date of Service: 01/29/2022 12:30 PM Medical Record Number: [id number] Patient Account Number: [id number] Date of Birth/Sex: 03/27/1961 (61 y.o. F) Treating RN: Yevonne Pax Primary Care Yolonda Purtle: [name] Other Clinician: Referring Mairim Bade: Card, John Treating Elly Haffey/Extender: Rowan Blase in Treatment: 15 Electronic Signature(s) Signed: 01/30/2022 1:16:38 PM By: Yevonne Pax RN Entered By: Yevonne Pax on 01/29/2022 12:42:25 [name], [name] ([id number]) -------------------------------------------------------------------------------- Multi Wound Chart Details Patient Name: Favata, Shantese C. Date of Service: 01/29/2022 12:30 PM Medical Record Number: [id number] Patient Account Number: [id number] Date of Birth/Sex: 08/01/1961 (61 y.o. F) Treating RN: Yevonne Pax Primary Care Jazzma Neidhardt: [name] Other Clinician: Referring Bettylee Feig: Card, John Treating Demarea Lorey/Extender: Rowan Blase in Treatment: 15 Vital Signs Height(in): Pulse(bpm): 85 Weight(lbs): 275 Blood Pressure(mmHg): 139/83 Body Mass Index(BMI): Temperature(F): 98.1 Respiratory Rate(breaths/min): 20 Photos: [N/A:N/A] Wound Location: Distal, Midline Back N/A N/A Wounding Event: Surgical Injury N/A N/A Primary Etiology: Dehisced  Wound N/A N/A Comorbid History: Hypertension N/A N/A Date Acquired: 04/11/2021 N/A N/A Weeks of Treatment: 15 N/A N/A Wound Status: Open N/A N/A Wound Recurrence: No N/A N/A Measurements L x W x D (cm) 0.3x0.3x2.7 N/A N/A Area (cm) : 0.071 N/A N/A Volume (cm) : 0.191 N/A N/A % Reduction in Area: 43.70% N/A N/A % Reduction in Volume: 67.00% N/A N/A Classification: Full Thickness Without Exposed N/A N/A Support Structures Exudate Amount: Medium N/A N/A Exudate Type: Serous N/A N/A Exudate  Color: amber N/A N/A Granulation Amount: Large (67-100%) N/A N/A Granulation Quality: Red N/A N/A Necrotic Amount: None Present (0%) N/A N/A Exposed Structures: Fat Layer (Subcutaneous Tissue): N/A N/A Yes Fascia: No Tendon: No Muscle: No Joint: No Bone: No Epithelialization: None N/A N/A Treatment Notes Electronic Signature(s) Signed: 01/29/2022 1:12:37 PM By: Yevonne Pax RN Entered By: Yevonne Pax on 01/29/2022 13:12:37 [name], [name] ([id number]) -------------------------------------------------------------------------------- Multi-Disciplinary Care Plan Details Patient Name: Galla, Cassadi C. Date of Service: 01/29/2022 12:30 PM Medical Record Number: [id number] Patient Account Number: [id number] Date of Birth/Sex: July 18, 1961 (61 y.o. F) Treating RN: Yevonne Pax Primary Care Paizleigh Wilds: [name] Other Clinician: Referring Katy Brickell: Card, John Treating Majestic Molony/Extender: Rowan Blase in Treatment: 15 Active Inactive Wound/Skin Impairment Nursing Diagnoses: Knowledge deficit related to ulceration/compromised skin integrity Goals: Patient/caregiver will verbalize understanding of skin care regimen Date Initiated: 10/15/2021 Target Resolution Date: 02/15/2022 Goal Status: Active Ulcer/skin breakdown will have a volume reduction of 30% by week 4 Date Initiated: 10/15/2021 Date Inactivated: 01/29/2022 Target Resolution Date: 12/15/2021 Goal Status: Unmet Unmet Reason:  comorbities Ulcer/skin breakdown will have a volume reduction of 50% by week 8 Date Initiated: 10/15/2021 Date Inactivated: 01/29/2022 Target Resolution Date: 01/15/2022 Goal Status: Unmet Unmet Reason: comorbities Ulcer/skin breakdown will have a volume reduction of 80% by week 12 Date Initiated: 10/15/2021 Target Resolution Date: 02/15/2022 Goal Status: Active Ulcer/skin breakdown will heal within 14 weeks Date Initiated: 10/15/2021 Target Resolution Date: 03/15/2022 Goal Status: Active Interventions: Assess patient/caregiver ability to obtain necessary supplies Assess patient/caregiver ability to perform ulcer/skin care regimen upon admission and as needed Assess ulceration(s) every visit Notes: Electronic Signature(s) Signed: 01/29/2022 1:12:19 PM By: Yevonne Pax RN Previous Signature: 01/29/2022 1:05:23 PM Version By: Yevonne Pax RN Entered By: Yevonne Pax on 01/29/2022 13:12:19 [name], [name] ([id number]) -------------------------------------------------------------------------------- Pain Assessment Details Patient Name: Pescador, Ronia C. Date of Service: 01/29/2022 12:30 PM Medical Record Number: [id number] Patient Account Number: [id number] Date of Birth/Sex: Jan 21, 1961 (61 y.o. F) Treating RN: Yevonne Pax Primary Care Itamar Mcgowan: [name] Other Clinician: Referring Chianne Byrns: Card, John Treating Vuong Musa/Extender: Rowan Blase in Treatment: 15 Active Problems Location of Pain Severity and Description of Pain Patient Has Paino Yes Site Locations With Dressing Change: Yes Duration of the Pain. Constant / Intermittento Intermittent How Long Does it Lasto Hours: Minutes: 10 Rate the pain. Current Pain Level: 3 Worst Pain Level: 4 Least Pain Level: 0 Tolerable Pain Level: 5 Character of Pain Describe the Pain: Aching Pain Management and Medication Current Pain Management: Medication: Yes Cold Application: No Rest: Yes Massage: No Activity:  No T.E.N.S.: No Heat Application: No Leg drop or elevation: No Is the Current Pain Management Adequate: Inadequate How does your wound impact your activities of daily livingo Sleep: No Bathing: No Appetite: No Relationship With Others: No Bladder Continence: No Emotions: No Bowel Continence: No Work: No Toileting: No Drive: No Dressing: No Hobbies: No Electronic Signature(s) Signed: 01/30/2022 1:16:38 PM By: Yevonne Pax RN Entered By: Yevonne Pax on 01/29/2022 12:40:45 [name], [name] ([id number]) -------------------------------------------------------------------------------- Patient/Caregiver Education Details Patient Name: [name], [name] Date of Service: 01/29/2022 12:30 PM Medical Record Number: [id number] Patient Account Number: [id number] Date of Birth/Gender: 09-Jan-1961 (61 y.o. F) Treating RN: Yevonne Pax Primary Care Physician: [name] Other Clinician: Referring Physician: Card, John Treating Physician/Extender: Rowan Blase in Treatment: 15 Education Assessment Education Provided To: Patient Education Topics Provided Wound/Skin Impairment: Methods: Explain/Verbal Responses: State content correctly Electronic Signature(s) Signed: 01/30/2022 1:16:38 PM By: Yevonne Pax RN Entered By: Jettie Pagan  Carrie on 01/29/2022 13:14:07 [name], [name] ([id number]) -------------------------------------------------------------------------------- Wound Assessment Details Patient Name: [name], [name] Ridge C. Date of Service: 01/29/2022 12:30 PM Medical Record Number: [id number] Patient Account Number: [id number] Date of Birth/Sex: [birth year] (61 y.o. F) Treating RN: Yevonne Pax Primary Care Arul Farabee: Card, Jonny Ruiz Other Clinician: Referring Shaelin Lalley: Card, John Treating Kaedin Hicklin/Extender: Rowan Blase in Treatment: 15 Wound Status Wound Number: 1 Primary Etiology: Dehisced Wound Wound Location: Distal, Midline Back Wound Status: Open Wounding Event: Surgical  Injury Comorbid History: Hypertension Date Acquired: 04/11/2021 Weeks Of Treatment: 15 Clustered Wound: No Photos Wound Measurements Length: (cm) 0.3 Width: (cm) 0.3 Depth: (cm) 2.7 Area: (cm) 0.071 Volume: (cm) 0.191 % Reduction in Area: 43.7% % Reduction in Volume: 67% Epithelialization: None Tunneling: No Undermining: No Wound Description Classification: Full Thickness Without Exposed Support Structures Exudate Amount: Medium Exudate Type: Serous Exudate Color: amber Foul Odor After Cleansing: No Slough/Fibrino No Wound Bed Granulation Amount: Large (67-100%) Exposed Structure Granulation Quality: Red Fascia Exposed: No Necrotic Amount: None Present (0%) Fat Layer (Subcutaneous Tissue) Exposed: Yes Tendon Exposed: No Muscle Exposed: No Joint Exposed: No Bone Exposed: No Treatment Notes Wound #1 (Back) Wound Laterality: Midline, Distal Cleanser Normal Saline Discharge Instruction: Wash your hands with soap and water. Remove old dressing, discard into plastic bag and place into trash. Cleanse the wound with Normal Saline prior to applying a clean dressing using gauze sponges, not tissues or cotton balls. Do not scrub or use excessive force. Pat dry using gauze sponges, not tissue or cotton balls. Rullo, Sharea C. ([id number]) Peri-Wound Care Topical Primary Dressing Hydrofera Blue Classic Foam Rope Dressing, 9x6 (mm/in) Discharge Instruction: cut rope in 1/4 lengthwise AND LEAVE A TAIL OUT TO GRAB IT Secondary Dressing Zetuvit Plus Silicone Border Dressing 4x4 (in/in) Secured With Compression Wrap Compression Stockings Add-Ons Electronic Signature(s) Signed: 01/30/2022 1:16:38 PM By: Yevonne Pax RN Entered By: Yevonne Pax on 01/29/2022 12:42:08 Herard, Hanan [name] Kitchen ([id number]) -------------------------------------------------------------------------------- Vitals Details Patient Name: Gorden, Terez C. Date of Service: 01/29/2022 12:30 PM Medical  Record Number: [id number] Patient Account Number: [id number] Date of Birth/Sex: 16-Nov-1961 (61 y.o. F) Treating RN: Yevonne Pax Primary Care Shareef Eddinger: [name] Other Clinician: Referring Kadeen Sroka: Card, John Treating Sun Wilensky/Extender: Rowan Blase in Treatment: 15 Vital Signs Time Taken: 12:39 Temperature (F): 98.1 Weight (lbs): 275 Pulse (bpm): 85 Respiratory Rate (breaths/min): 20 Blood Pressure (mmHg): 139/83 Reference Range: 80 - 120 mg / dl Electronic Signature(s) Signed: 01/30/2022 1:16:38 PM By: Yevonne Pax RN Entered By: Yevonne Pax on 01/29/2022 12:40:13

## 2022-01-31 ENCOUNTER — Other Ambulatory Visit: Payer: Self-pay

## 2022-01-31 ENCOUNTER — Encounter: Payer: 59 | Attending: Physician Assistant

## 2022-01-31 DIAGNOSIS — T8131XA Disruption of external operation (surgical) wound, not elsewhere classified, initial encounter: Secondary | ICD-10-CM | POA: Insufficient documentation

## 2022-01-31 DIAGNOSIS — I10 Essential (primary) hypertension: Secondary | ICD-10-CM | POA: Diagnosis not present

## 2022-01-31 DIAGNOSIS — G35 Multiple sclerosis: Secondary | ICD-10-CM | POA: Insufficient documentation

## 2022-01-31 DIAGNOSIS — Y838 Other surgical procedures as the cause of abnormal reaction of the patient, or of later complication, without mention of misadventure at the time of the procedure: Secondary | ICD-10-CM | POA: Insufficient documentation

## 2022-01-31 DIAGNOSIS — L98422 Non-pressure chronic ulcer of back with fat layer exposed: Secondary | ICD-10-CM | POA: Insufficient documentation

## 2022-01-31 NOTE — Progress Notes (Signed)
[name] ([id number]) Visit Report for 01/31/2022 Arrival Information Details Patient Name: [name], [name] Date of Service: 01/31/2022 12:00 PM Medical Record Number: [id number] Patient Account Number: [id number] Date of Birth/Sex: 1961/06/26 (61 y.o. F) Treating RN: [name] Primary Care [name]: [name] Other Clinician: Referring [name]: Card, [name]: [name] in Treatment: 15 Visit Information History Since Last Visit Added or deleted any medications: No Patient Arrived: [name] Had a fall or experienced change in No Arrival Time: 11:49 activities of daily living that may affect Accompanied By: self risk of falls: Transfer Assistance: None Hospitalized since last visit: No Patient Identification Verified: Yes Has Dressing in Place as Prescribed: Yes Secondary Verification Process Completed: Yes Pain Present Now: No Patient Requires Transmission-Based Precautions: No Patient Has Alerts: No Electronic Signature(s) Signed: 01/31/2022 12:52:22 PM By: [name] Entered By: [name] on 01/31/2022 11:58:30 [name], [name] ([id number]) -------------------------------------------------------------------------------- Clinic Level of Care Assessment Details Patient Name: [name], [name] Date of Service: 01/31/2022 12:00 PM Medical Record Number: [id number] Patient Account Number: [id number] Date of Birth/Sex: 1961-01-05 (61 y.o. F) Treating RN: [name] Primary Care Jaiyana Canale: CardJenny Reichmann Other Clinician: Referring [name]: [name] Treating [name]: [name] in Treatment: 15 Clinic Level of Care Assessment Items TOOL 4 Quantity Score []  - Use when only an EandM is performed on FOLLOW-UP visit 0 ASSESSMENTS - Nursing Assessment / Reassessment []  - Reassessment of Co-morbidities (includes updates in patient status) 0 []  - 0 Reassessment of Adherence to Treatment Plan ASSESSMENTS - Wound and Skin  Assessment / Reassessment X - Simple Wound Assessment / Reassessment - one wound 1 5 []  - 0 Complex Wound Assessment / Reassessment - multiple wounds []  - 0 Dermatologic / Skin Assessment (not related to wound area) ASSESSMENTS - Focused Assessment []  - Circumferential Edema Measurements - multi extremities 0 []  - 0 Nutritional Assessment / Counseling / Intervention []  - 0 Lower Extremity Assessment (monofilament, tuning fork, pulses) []  - 0 Peripheral Arterial Disease Assessment (using hand held doppler) ASSESSMENTS - Ostomy and/or Continence Assessment and Care []  - Incontinence Assessment and Management 0 []  - 0 Ostomy Care Assessment and Management (repouching, etc.) PROCESS - Coordination of Care X - Simple Patient / Family Education for ongoing care 1 15 []  - 0 Complex (extensive) Patient / Family Education for ongoing care []  - 0 Staff obtains Programmer, systems, Records, Test Results / Process Orders []  - 0 Staff telephones HHA, Nursing Homes / Clarify orders / etc []  - 0 Routine Transfer to another Facility (non-emergent condition) []  - 0 Routine Hospital Admission (non-emergent condition) []  - 0 New Admissions / Biomedical engineer / Ordering NPWT, Apligraf, etc. []  - 0 Emergency Hospital Admission (emergent condition) X- 1 10 Simple Discharge Coordination []  - 0 Complex (extensive) Discharge Coordination PROCESS - Special Needs []  - Pediatric / Minor Patient Management 0 []  - 0 Isolation Patient Management []  - 0 Hearing / Language / Visual special needs []  - 0 Assessment of Community assistance (transportation, D/[name] planning, etc.) []  - 0 Additional assistance / Altered mentation []  - 0 Support Surface(s) Assessment (bed, cushion, seat, etc.) INTERVENTIONS - Wound Cleansing / Measurement [name], [name]. ([id number]) X- 1 5 Simple Wound Cleansing - one wound []  - 0 Complex Wound Cleansing - multiple wounds []  - 0 Wound Imaging (photographs - any number  of wounds) []  - 0 Wound Tracing (instead of photographs) X- 1 5 Simple Wound Measurement - one wound []  - 0 Complex Wound Measurement -  multiple wounds INTERVENTIONS - Wound Dressings X - Small Wound Dressing one or multiple wounds 1 10 []  - 0 Medium Wound Dressing one or multiple wounds []  - 0 Large Wound Dressing one or multiple wounds []  - 0 Application of Medications - topical []  - 0 Application of Medications - injection INTERVENTIONS - Miscellaneous []  - External ear exam 0 []  - 0 Specimen Collection (cultures, biopsies, blood, body fluids, etc.) []  - 0 Specimen(s) / Culture(s) sent or taken to Lab for analysis []  - 0 Patient Transfer (multiple staff / Civil Service fast streamer / Similar devices) []  - 0 Simple Staple / Suture removal (25 or less) []  - 0 Complex Staple / Suture removal (26 or more) []  - 0 Hypo / Hyperglycemic Management (close monitor of Blood Glucose) []  - 0 Ankle / Brachial Index (ABI) - do not check if billed separately []  - 0 Vital Signs Has the patient been seen at the hospital within the last three years: Yes Total Score: 50 Level Of Care: New/Established - Level 2 Electronic Signature(s) Signed: 01/31/2022 12:52:22 PM By: [name] Entered By: [name] on 01/31/2022 12:08:12 [name], [name] ([id number]) -------------------------------------------------------------------------------- Encounter Discharge Information Details Patient Name: [name], [name] Date of Service: 01/31/2022 12:00 PM Medical Record Number: [id number] Patient Account Number: [id number] Date of Birth/Sex: 10/19/1961 (61 y.o. F) Treating RN: [name] Primary Care [name]: [name] Other Clinician: Referring [name]: [name] Treating [name]: [name] in Treatment: 15 Encounter Discharge Information Items Discharge Condition: Stable Ambulatory Status: Walker Discharge Destination: Home Transportation: Private Auto Accompanied By:  self Schedule Follow-up Appointment: Yes Clinical Summary of Care: Electronic Signature(s) Signed: 01/31/2022 12:52:22 PM By: [name] Entered By: [name] on 01/31/2022 12:07:20 [name], [name] ([id number]) -------------------------------------------------------------------------------- Wound Assessment Details Patient Name: [name], [name] Date of Service: 01/31/2022 12:00 PM Medical Record Number: [id number] Patient Account Number: [id number] Date of Birth/Sex: [birth year] (60 y.o. F) Treating RN: [name] Primary Care Albaraa Swingle: CardJenny Reichmann Other Clinician: Referring Jermond Burkemper: [name] Treating Gypsy Kellogg/Extender: [name] in Treatment: 15 Wound Status Wound Number: 1 Primary Etiology: Dehisced Wound Wound Location: Distal, Midline Back Wound Status: Open Wounding Event: Surgical Injury Comorbid History: Hypertension Date Acquired: 04/11/2021 Weeks Of Treatment: 15 Clustered Wound: No Wound Measurements Length: (cm) 0.3 Width: (cm) 0.3 Depth: (cm) 2.7 Area: (cm) 0.071 Volume: (cm) 0.191 % Reduction in Area: 43.7% % Reduction in Volume: 67% Epithelialization: None Wound Description Classification: Full Thickness Without Exposed Support Structu Exudate Amount: Medium Exudate Type: Serous Exudate Color: amber res Foul Odor After Cleansing: No Slough/Fibrino No Wound Bed Granulation Amount: Large (67-100%) Exposed Structure Granulation Quality: Red Fascia Exposed: No Necrotic Amount: None Present (0%) Fat Layer (Subcutaneous Tissue) Exposed: Yes Tendon Exposed: No Muscle Exposed: No Joint Exposed: No Bone Exposed: No Treatment Notes Wound #1 (Back) Wound Laterality: Midline, Distal Cleanser Normal Saline Discharge Instruction: Wash your hands with soap and water. Remove old dressing, discard into plastic bag and place into trash. Cleanse the wound with Normal Saline prior to applying a clean dressing using gauze sponges, not tissues or  cotton balls. Do not scrub or use excessive force. Pat dry using gauze sponges, not tissue or cotton balls. Peri-Wound Care Topical Primary Dressing Hydrofera Blue Classic Foam Rope Dressing, 9x6 (mm/in) Discharge Instruction: cut rope in 1/4 lengthwise AND LEAVE A TAIL OUT TO GRAB IT Secondary Dressing Zetuvit Plus Silicone Border Dressing 4x4 (in/in) Secured With Compression Wrap Compression Stockings [name], [name] ([id number]) Add-Ons Electronic Signature(s) Signed: 01/31/2022  12:52:22 PM By: [name] Entered ByDonnamarie [name] on 01/31/2022 11:58:47

## 2022-02-01 NOTE — Progress Notes (Signed)
[name], [name] ([id number]) Visit Report for 01/31/2022 Physician Orders Details Patient Name: [name], [name] Date of Service: 01/31/2022 12:00 PM Medical Record Number: [id number] Patient Account Number: [id number] Date of Birth/Sex: [name] (61 y.o. F) Treating RN: Donnamarie Poag Primary Care Provider: Clayborn [name] Other Clinician: Referring Provider: Card, [name] Treating Provider/Extender: [name] in Treatment: 15 Verbal / Phone Orders: No Diagnosis Coding Follow-up Appointments o Return Appointment in 1 week. o Nurse Visit as needed - nurse visit on tuesday and [place]: - [facility] for wound care. [name] utilize formulary equivalent dressing for wound treatment orders unless otherwise specified. Home Health Nurse [name] visit PRN to address patientos wound care needs. - 3 times per week [facility] fax [phone] Bathing/ Shower/ Hygiene o [name] shower; gently cleanse wound with antibacterial soap, rinse and pat dry prior to dressing wounds o No tub bath. Anesthetic (Use 'Patient Medications' Section for Anesthetic Order Entry) o Lidocaine applied to wound bed Edema Control - Lymphedema / Segmental Compressive Device / Other o Elevate, Exercise Daily and Avoid Standing for Long Periods of Time. o Elevate legs to the level of the heart and pump ankles as often as possible o Elevate leg(s) parallel to the floor when sitting. Additional Orders / Instructions o Follow Nutritious Diet and Increase Protein Intake Wound Treatment Wound #1 - Back Wound Laterality: Midline, Distal Cleanser: Normal Saline 1 x Per Day/30 Days Discharge Instructions: Wash your hands with soap and water. Remove old dressing, discard into plastic bag and place into trash. Cleanse the wound with Normal Saline prior to applying a clean dressing using gauze sponges, not tissues or cotton balls. Do not scrub or use excessive force.  Pat dry using gauze sponges, not tissue or cotton balls. Primary Dressing: Hydrofera Blue Classic Foam Rope Dressing, 9x6 (mm/in) 1 x Per Day/30 Days Discharge Instructions: cut rope in 1/4 lengthwise AND LEAVE A TAIL OUT TO GRAB IT Secondary Dressing: Zetuvit Plus Silicone Border Dressing 4x4 (in/in) 1 x Per Day/30 Days Electronic Signature(s) Signed: 01/31/2022 12:52:22 PM By: Donnamarie Poag Signed: 02/01/2022 4:48:07 PM By: Worthy Keeler PA-C Entered By: Donnamarie Poag on 01/31/2022 12:04:41 [name], [name] ([id number]) -------------------------------------------------------------------------------- [place] Details Patient Name: [name], [name] Date of Service: 01/31/2022 Medical Record Number: [id number] Patient Account Number: [id number] Date of Birth/Sex: [name] (61 y.o. 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

## 2022-02-05 ENCOUNTER — Other Ambulatory Visit: Payer: Self-pay

## 2022-02-05 ENCOUNTER — Encounter: Payer: 59 | Admitting: Physician Assistant

## 2022-02-05 DIAGNOSIS — T8131XA Disruption of external operation (surgical) wound, not elsewhere classified, initial encounter: Secondary | ICD-10-CM | POA: Diagnosis not present

## 2022-02-05 NOTE — Progress Notes (Addendum)
ZITA, OZIMEK ([id number]) Visit Report for 02/05/2022 Chief Complaint Document Details Patient Name: [name], [name] Date of Service: 02/05/2022 12:30 PM Medical Record Number: [id number] Patient Account Number: [id number] Date of Birth/Sex: 25-Nov-1961 (61 y.o. F) Treating RN: Yevonne Pax Primary Care Provider: [name] Other Clinician: Referring Provider: Card, John Treating Provider/Extender: Rowan Blase in Treatment: 16 Information Obtained from: Patient Chief Complaint Surgical Back Ulcer Electronic Signature(s) Signed: 02/05/2022 12:53:57 PM By: [name] PA-C Entered By: [name] on 02/05/2022 12:53:57 [name], [name] ([id number]) -------------------------------------------------------------------------------- HPI Details Patient Name: Durnell, Antanette C. Date of Service: 02/05/2022 12:30 PM Medical Record Number: [id number] Patient Account Number: [id number] Date of Birth/Sex: 07/22/1961 (61 y.o. F) Treating RN: Yevonne Pax Primary Care Provider: [name] Other Clinician: Referring Provider: Card, John Treating Provider/Extender: Rowan Blase in Treatment: 16 History of Present Illness HPI Description: 10/15/2021 upon evaluation today patient presents for initial evaluation here in the clinic concerning a surgical ulceration/dehiscence in the lumbar spine region following surgery that she had over the past year. This was actually broken up into 3 separate surgical events. The initial surgical intervention actually was on November 05, 2021 almost a year ago. Subsequently the patient went back in February for a seroma of the area which unfortunately required her to have a repeat surgery to go in and clean this out. And then again this occurred in April where she went back in and again they felt like stitches were coming out and there was an additional seroma. She was placed in a wound VAC initially and then subsequently as it got smaller that was  discontinued. Again right now I will see anything that I think a wound VAC would help with. Nonetheless she definitely has a significant depth to the wound that is going require packing. I actually believe the Hydrofera Blue rope would probably do quite well with this the problem is as much as it is draining she probably needs this to be changed at least every day. She does not really have anyone that can help with that that is the complicating scenario here. With that being said the patient does have a history of multiple sclerosis, hypertension, and again this surgical wound dehiscence in regard to her lumbar spine region. She did have a repeat MRI which was actually completed 10/09/2021. This showed that there was no significant change in the subcutaneous fluid collection/track of the lower lumbar region. This is extending to the level of the fascia unfortunately. This seems to go all the way from the L2 level with a track extending all the way to the fascia at the L4-5 level. Again this is a significant wound and there is significant drainage but does not seem to communicate to the spinal region as far as spinal fluid or otherwise is concerned that is good news. Nonetheless she last saw Dr. Franky Macho who is her neurosurgeon on 10/01/2021 that was when he ordered this last MRI she supposed to see him next week as well. With that being said he did not feel like there was any significant issue there but was not sure why this was not healing that is when he ordered the MRI. They were wanting to make sure that this was packed appropriately by home health unfortunately the main issue currently is that home health is completely out of the picture as the patient has exhausted all the home health that that she gets for a year. She is now in a very difficult  predicament where she does not have anyone to help her change the dressing and to be honest that she is not able to do it herself with the location of the  wound being on the midline lumbar spine region. If she does not have anyone that can help it is probably can to be necessary for her to go to a facility for rehab and daily dressing changes as I feel like daily changes which is much drainage that she is having is going to be necessary. 10/23/2021 upon evaluation today patient appears to be doing decently well in regard to her back ulcer. This does seem to be draining a lot less than what it is been doing in the past. With that being said she still has quite a bit of drainage nonetheless. I do think that given time this should improve least I hope so. The good news is she does have home health coming out 3 days a week were doing it 2 days a week and she is paying someone we can to help. 10/30/2021 upon evaluation today patient appears to be doing okay in regard to her back ulcer this is not draining quite as bad as it was in the beginning but he still has quite a bit of drainage noted. I do believe that the patient would benefit from [place] going ahead forward with attempting a wound VAC using the Hydrofera Blue rope to pack with and then subsequently using the VAC externally to actually suction out and help this to fill- in. I think this is our ideal way to try to get things cleared at this point. As it stands I am not certain that we are really making a progress that we want to see near with doing it in the way we are which is packing with the rope. It is a good dressing but I do think it is insufficient for total healing. She just seems to have too much in the way of drainage at this point unfortunately. 11/06/2021 upon evaluation today patient unfortunately continues to have issues with her back ongoing. The good news is her MRI that was repeated showed signs of the size of this area in the lumbar spine region having decreased from 4 cm to 3.5 cm this is definitely not bad news at all. With that being said unfortunately she continues to have issues with  ongoing drainage not as severe as in the beginning but nonetheless still significant. I do think a wound VAC still would be a good way to go although her home health agency nurse apparently has some concerns about the possibility of not being able to keep a seal with this as they had struggles in the past. Nonetheless I explained to the patient that this is much different than what she had previous and that I really feel like it would do much better as far as getting the area taken care of without having any complications or issues here. I think that we should be able to maintain a seal. Nonetheless at this time I did discuss with the patient as well that she probably does need to have a wound VAC in order for [place] to get this moving in the right direction. 11/13/2021 upon evaluation patient's wound bed actually showed signs of significant drainage at this time. She did see the surgeon yesterday he did not see anything that appeared to be infected. Nonetheless he does appear that she is continuing to have areas here that just do not seem  to want to seal up there MRI findings have been negative but nonetheless she continues to have is the seroma that is filling in. I do feel like we need to try to widen the hole so we can get at least a half of the [facility] then this will be better than nothing at this point. 11/20/2021 upon evaluation today patient actually appears to be having less pain at this point which is good news and overall she we still do not have the results of the culture back yet it had to be sent out to [facility] and we do not have the result back yet. Is doing decently well in regard to her wound. Fortunately there does not appear to be any signs of active infection systemically nor locally at this time. 11/27/2021 upon evaluation today patient appears to be doing well with regard to her wound all things considered there does appear to be less drainage than there was previous.  Fortunately I do not see any evidence of worsening of the patient is stating that she is having some issues with back pain. This is somewhat new. Again this I think could be related to the fact that she is having some issues here with infection. We are still waiting to see what the result of her culture shows from susceptibility testing Enterococcus has been identified but we do not know if this is VRE or not. 12/04/2021 upon evaluation today patient appears to be doing well with regard to her wound all things considered. I did have a conversation with Erin from Dr. Lacy Duverney office. Of note she notes that Dr. Joetta Manners really does not want to do anything surgical right now which I completely understand. With that being said I am still leery of how far we will make it getting this to heal short of any type of surgery to open this up and allow [place] to more appropriately packed the wound. Nonetheless I will absolutely give it our best shot as far as that is concerned. I discussed that with the patient today. She voiced understanding. The good news is the drainage today seems to be clear it is no longer cloudy as it was previous I am actually very pleased in that regard. [name], [name] ([id number]) 12/11/2021 since have last seen the patient actually did have an conversation with Dr. Christella Noa with her neurosurgeon. Again this involve the discussion around whether or not to open the wound and try to apply a wound VAC following. With that being said the decision was made that that may be the best thing to do if the patient was in agreement. Nonetheless I am actually extremely encouraged with what I am seeing today much more than I would have thought. In fact I think that we may be over packing the wound which is why she is having some discomfort at this point as there is much less of the dressing able to get and this time compared to what we saw previous. That is actually really good news. In fact the 6:00  tunnel appears to have filled in is awesome news. At the 12:00 location I am actually able to pack into that area pretty effectively at this point today. In fact I was able to get less of the packing in which I will detail below. 12/18/2021 upon evaluation today patient appears to be doing well with regard to her wound on the back. Fortunately there is no signs of active infection which is great news and overall  very pleased with where things stand today. No fevers, chills, nausea, vomiting, or diarrhea. 01/01/2022 upon evaluation today patient appears to be doing decently well in regard to her wound. Fortunately there does not appear to be any signs of anything worsening which is great news. She still continues to have issues with drainage but this is not nearly as significant as what it was in the past. There is all clear drainage no signs of purulence noted. 01/08/2022 upon evaluation today patient appears to be doing well with regard to her wound. With that being said she tells me that she has been having some increased pain over the past week. It does appear based on the amount of Hydrofera Blue that we removed this is probably over pack. Again it is difficult to know exactly how old the nurses getting all the skin but nonetheless the length of Hydrofera Blue that was utilized was way too much which may be part of the reason why this felt so uncomfortable. Fortunately I think that we can cut back on that we discussed pieces smaller so hopefully that will not be over packed. 01/22/2021 upon evaluation today patient appears to be doing well With regard to there being no signs of infection at this time. The wound does still tunnel mainly up at the 12:00 location. The depth of the tunnel complete from entry point to the base is about 3.5 cm today. With that being said I do believe that overall she is making good progress this is just a very slow process for her which I know has been frustrating as  well. 01/29/2022 upon evaluation today patient appears to be doing well with regard to the wound on her lower back and the lumbar spine region. The good news is the depth that I got this week was a little bit less going up at the 12:00 location as compared to last week. I measured this to be 3.5 cm last week and it was 3.3 cm this week. Again that is a minute shift but nonetheless a good shift in the right direction. Overall I think that we are on the right track. 02/05/2022 upon evaluation today patient appears to be doing well with regard to her wound. In fact right now her measurements are somewhere around 2.9 cm which is definitely smaller and less deep than last week At the 12:00 location. Overall I am very pleased with what we are seeing. Electronic Signature(s) Signed: 02/05/2022 1:02:28 PM By: [name] PA-C Entered By: [name] on 02/05/2022 13:02:28 [name], [name] ([id number]) -------------------------------------------------------------------------------- Physical Exam Details Patient Name: Treloar, Yuleimy C. Date of Service: 02/05/2022 12:30 PM Medical Record Number: [id number] Patient Account Number: [id number] Date of Birth/Sex: 1961/01/08 (61 y.o. F) Treating RN: Yevonne Pax Primary Care Provider: [name] Other Clinician: Referring Provider: Card, John Treating Provider/Extender: Rowan Blase in Treatment: 16 Constitutional Obese and well-hydrated in no acute distress. Respiratory normal breathing without difficulty. Psychiatric this patient is able to make decisions and demonstrates good insight into disease process. Alert and Oriented x 3. pleasant and cooperative. Notes Upon inspection patient's wound bed showed signs of good granulation and epithelization at this point. Fortunately I do not see any evidence of infection locally or systemically and I think that we are making good progress here which is great news. No fevers, chills, nausea, vomiting,  or diarrhea. Electronic Signature(s) Signed: 02/05/2022 1:02:43 PM By: [name] PA-C Entered By: [name] on 02/05/2022 13:02:43 Esquibel, Kairy  Salena [name] ([id number]) -------------------------------------------------------------------------------- Physician Orders Details Patient Name: Sentell, Analeigh C. Date of Service: 02/05/2022 12:30 PM Medical Record Number: [id number] Patient Account Number: [id number] Date of Birth/Sex: January 23, 1961 (61 y.o. 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 o Nurse Visit as needed - nurse visit on tuesday and thursday Home Health o Home Health Company: [facility] o [facility] for wound care. May utilize formulary equivalent dressing for wound treatment orders unless otherwise specified. Home Health Nurse may visit PRN to address patientos wound care needs. - 3 times per week [facility] fax [phone] Bathing/ Shower/ Hygiene o May shower; gently cleanse wound with antibacterial soap, rinse and pat dry prior to dressing wounds o No tub bath. Anesthetic (Use 'Patient Medications' Section for Anesthetic Order Entry) o Lidocaine applied to wound bed Edema Control - Lymphedema / Segmental Compressive Device / Other o Elevate, Exercise Daily and Avoid Standing for Long Periods of Time. o Elevate legs to the level of the heart and pump ankles as often as possible o Elevate leg(s) parallel to the floor when sitting. Additional Orders /  Instructions o Follow Nutritious Diet and Increase Protein Intake Wound Treatment Wound #1 - Back Wound Laterality: Midline, Distal Cleanser: Normal Saline 1 x Per Day/30 Days Discharge Instructions: Wash your hands with soap and water. Remove old dressing, discard into plastic bag and place into trash. Cleanse the wound with Normal Saline prior to applying a clean dressing using gauze sponges, not tissues or cotton balls. Do not scrub or use excessive force. Pat dry using gauze sponges, not tissue or cotton balls. Primary Dressing: Hydrofera Blue Classic Foam Rope Dressing, 9x6 (mm/in) 1 x Per Day/30 Days Discharge Instructions: cut rope in 1/4 lengthwise AND LEAVE A TAIL OUT TO GRAB IT Secondary Dressing: Zetuvit Plus Silicone Border Dressing 4x4 (in/in) 1 x Per Day/30 Days Electronic Signature(s) Signed: 02/05/2022 4:09:23 PM By: [name] PA-C Signed: 02/07/2022 8:26:07 AM By: Yevonne Pax RN Entered By: Yevonne Pax on 02/05/2022 13:07:46 [name], [name] ([id number]) -------------------------------------------------------------------------------- Problem List Details Patient Name: Delillo, Mckala C. Date of Service: 02/05/2022 12:30 PM Medical Record Number: [id number] Patient Account Number: [id number] Date of Birth/Sex: [birth year] (61 y.o. 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 ([id number]) -------------------------------------------------------------------------------- Progress Note Details Patient Name: [name], [name] Date of Service: 02/05/2022 12:30 PM Medical Record Number: [id number] Patient Account Number: [id number] Date of Birth/Sex: 1960-12-31 (61 y.o. F) Treating RN: Yevonne Pax Primary Care Provider: [name] Other Clinician: Referring Provider: Card, John Treating Provider/Extender: Rowan Blase in Treatment: 16 Subjective Chief Complaint Information obtained  from Patient Surgical Back Ulcer History of Present Illness (HPI) 10/15/2021 upon evaluation today patient presents for initial evaluation here in the clinic concerning a surgical ulceration/dehiscence in the lumbar spine region following surgery that she had over the past year. This was actually broken up into 3 separate surgical events. The initial surgical intervention actually was on November 05, 2021 almost a year ago. Subsequently the patient went back in February for a seroma of the area which unfortunately required her to have a repeat surgery to go in and clean this out. And then again this occurred in April where she went back in and again they felt like stitches were coming out and there was an additional seroma. She was placed in a wound VAC initially and then subsequently as it got smaller that was discontinued. Again right now I will see anything that I think a wound VAC would help with. Nonetheless she definitely has a significant depth to the wound that is going require packing. I actually believe the Hydrofera Blue rope would probably do quite well with this the problem is as much as it is draining she probably needs this to be changed at least every day. She does not really have anyone that can help with that that is the complicating scenario here. With that being said the  patient does have a history of multiple sclerosis, hypertension, and again this surgical wound dehiscence in regard to her lumbar spine region. She did have a repeat MRI which was actually completed 10/09/2021. This showed that there was no significant change in the subcutaneous fluid collection/track of the lower lumbar region. This is extending to the level of the fascia unfortunately. This seems to go all the way from the L2 level with a track extending all the way to the fascia at the L4-5 level. Again this is a significant wound and there is significant drainage but does not seem to communicate to the spinal region as far as spinal fluid or otherwise is concerned that is good news. Nonetheless she last saw Dr. Franky Macho who is her neurosurgeon on 10/01/2021 that was when he ordered this last MRI she supposed to see him next week as well. With that being said he did not feel like there was any significant issue there but was not sure why this was not healing that is when he ordered the MRI. They were wanting to make sure that this was packed appropriately by home health unfortunately the main issue currently is that home health is completely out of the picture as the patient has exhausted all the home health that that she gets for a year. She is now in a very difficult predicament where she does not have anyone to help her change the dressing and to be honest that she is not able to do it herself with the location of the wound being on the midline lumbar spine region. If she does not have anyone that can help it is probably can to be necessary for her to go to a facility for rehab and daily dressing changes as I feel like daily changes which is much drainage that she is having is going to be necessary. 10/23/2021 upon evaluation today patient appears to be doing decently well in regard to her back ulcer. This does seem to be draining a lot less than what it is been doing in the past. With that being  said she still has quite a bit of drainage nonetheless. I do think that given  time this should improve least I hope so. The good news is she does have home health coming out 3 days a week were doing it 2 days a week and she is paying someone we can to help. 10/30/2021 upon evaluation today patient appears to be doing okay in regard to her back ulcer this is not draining quite as bad as it was in the beginning but he still has quite a bit of drainage noted. I do believe that the patient would benefit from [place] going ahead forward with attempting a wound VAC using the Hydrofera Blue rope to pack with and then subsequently using the VAC externally to actually suction out and help this to fill- in. I think this is our ideal way to try to get things cleared at this point. As it stands I am not certain that we are really making a progress that we want to see near with doing it in the way we are which is packing with the rope. It is a good dressing but I do think it is insufficient for total healing. She just seems to have too much in the way of drainage at this point unfortunately. 11/06/2021 upon evaluation today patient unfortunately continues to have issues with her back ongoing. The good news is her MRI that was repeated showed signs of the size of this area in the lumbar spine region having decreased from 4 cm to 3.5 cm this is definitely not bad news at all. With that being said unfortunately she continues to have issues with ongoing drainage not as severe as in the beginning but nonetheless still significant. I do think a wound VAC still would be a good way to go although her home health agency nurse apparently has some concerns about the possibility of not being able to keep a seal with this as they had struggles in the past. Nonetheless I explained to the patient that this is much different than what she had previous and that I really feel like it would do much better as far as getting the area taken  care of without having any complications or issues here. I think that we should be able to maintain a seal. Nonetheless at this time I did discuss with the patient as well that she probably does need to have a wound VAC in order for [place] to get this moving in the right direction. 11/13/2021 upon evaluation patient's wound bed actually showed signs of significant drainage at this time. She did see the surgeon yesterday he did not see anything that appeared to be infected. Nonetheless he does appear that she is continuing to have areas here that just do not seem to want to seal up there MRI findings have been negative but nonetheless she continues to have is the seroma that is filling in. I do feel like we need to try to widen the hole so we can get at least a half of the [facility] then this will be better than nothing at this point. 11/20/2021 upon evaluation today patient actually appears to be having less pain at this point which is good news and overall she we still do not have the results of the culture back yet it had to be sent out to [facility] and we do not have the result back yet. Is doing decently well in regard to her wound. Fortunately there does not appear to be any signs of active infection systemically nor locally at this time. 11/27/2021 upon evaluation today patient  appears to be doing well with regard to her wound all things considered there does appear to be less drainage than there was previous. Fortunately I do not see any evidence of worsening of the patient is stating that she is having some issues with back pain. This is somewhat new. Again this I think could be related to the fact that she is having some issues here with infection. We are still waiting to see what the result of her culture shows from susceptibility testing Enterococcus has been identified but we do not know if this is VRE or not. 12/04/2021 upon evaluation today patient appears to be doing well with regard to  her wound all things considered. I did have a conversation with Erin from Dr. Sueanne Margarita office. Of note she notes that Dr. Drinda Butts really does not want to do anything surgical right now which I completely Needs, Rayhana C. ([id number]) understand. With that being said I am still leery of how far we will make it getting this to heal short of any type of surgery to open this up and allow [place] to more appropriately packed the wound. Nonetheless I will absolutely give it our best shot as far as that is concerned. I discussed that with the patient today. She voiced understanding. The good news is the drainage today seems to be clear it is no longer cloudy as it was previous I am actually very pleased in that regard. 12/11/2021 since have last seen the patient actually did have an conversation with Dr. Franky Macho with her neurosurgeon. Again this involve the discussion around whether or not to open the wound and try to apply a wound VAC following. With that being said the decision was made that that may be the best thing to do if the patient was in agreement. Nonetheless I am actually extremely encouraged with what I am seeing today much more than I would have thought. In fact I think that we may be over packing the wound which is why she is having some discomfort at this point as there is much less of the dressing able to get and this time compared to what we saw previous. That is actually really good news. In fact the 6:00 tunnel appears to have filled in is awesome news. At the 12:00 location I am actually able to pack into that area pretty effectively at this point today. In fact I was able to get less of the packing in which I will detail below. 12/18/2021 upon evaluation today patient appears to be doing well with regard to her wound on the back. Fortunately there is no signs of active infection which is great news and overall very pleased with where things stand today. No fevers, chills, nausea,  vomiting, or diarrhea. 01/01/2022 upon evaluation today patient appears to be doing decently well in regard to her wound. Fortunately there does not appear to be any signs of anything worsening which is great news. She still continues to have issues with drainage but this is not nearly as significant as what it was in the past. There is all clear drainage no signs of purulence noted. 01/08/2022 upon evaluation today patient appears to be doing well with regard to her wound. With that being said she tells me that she has been having some increased pain over the past week. It does appear based on the amount of Hydrofera Blue that we removed this is probably over pack. Again it is difficult to know exactly how old the nurses  getting all the skin but nonetheless the length of Hydrofera Blue that was utilized was way too much which may be part of the reason why this felt so uncomfortable. Fortunately I think that we can cut back on that we discussed pieces smaller so hopefully that will not be over packed. 01/22/2021 upon evaluation today patient appears to be doing well With regard to there being no signs of infection at this time. The wound does still tunnel mainly up at the 12:00 location. The depth of the tunnel complete from entry point to the base is about 3.5 cm today. With that being said I do believe that overall she is making good progress this is just a very slow process for her which I know has been frustrating as well. 01/29/2022 upon evaluation today patient appears to be doing well with regard to the wound on her lower back and the lumbar spine region. The good news is the depth that I got this week was a little bit less going up at the 12:00 location as compared to last week. I measured this to be 3.5 cm last week and it was 3.3 cm this week. Again that is a minute shift but nonetheless a good shift in the right direction. Overall I think that we are on the right track. 02/05/2022 upon evaluation  today patient appears to be doing well with regard to her wound. In fact right now her measurements are somewhere around 2.9 cm which is definitely smaller and less deep than last week At the 12:00 location. Overall I am very pleased with what we are seeing. Objective Constitutional Obese and well-hydrated in no acute distress. Vitals Time Taken: 12:46 PM, Weight: 275 lbs, Temperature: 98.1 F, Pulse: 76 bpm, Respiratory Rate: 18 breaths/min, Blood Pressure: 148/80 mmHg. Respiratory normal breathing without difficulty. Psychiatric this patient is able to make decisions and demonstrates good insight into disease process. Alert and Oriented x 3. pleasant and cooperative. General Notes: Upon inspection patient's wound bed showed signs of good granulation and epithelization at this point. Fortunately I do not see any evidence of infection locally or systemically and I think that we are making good progress here which is great news. No fevers, chills, nausea, vomiting, or diarrhea. Integumentary (Hair, Skin) Wound #1 status is Open. Original cause of wound was Surgical Injury. The date acquired was: 04/11/2021. The wound has been in treatment 16 weeks. The wound is located on the Distal,Midline Back. The wound measures 0.3cm length x 0.3cm width x 2.7cm depth; 0.071cm^2 area and 0.191cm^3 volume. There is Fat Layer (Subcutaneous Tissue) exposed. There is no tunneling or undermining noted. There is a medium amount of serous drainage noted. There is large (67-100%) red granulation within the wound bed. There is no necrotic tissue within the wound bed. Assessment [name], [name] 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 I am going to suggest that we going to continue with the [facility] Blue  rope which I think is doing awesome. We did cut a piece and slipped into the area and overall she seems to be doing extremely well currently. 2. I am also can recommend that we have the patient go and continue with the border foam dressing to cover which is given some padding and catch any drainage good news is the drainage seems to be less severe and significant than what  was noted previously. We will see patient back for reevaluation in 1 week here in the clinic. If anything worsens or changes patient will contact our office for additional recommendations. Electronic Signature(s) Signed: 02/05/2022 1:03:17 PM By: [name] PA-C Entered By: [name] on 02/05/2022 13:03:17 [name], [name] ([id number]) -------------------------------------------------------------------------------- SuperBill Details Patient Name: [name], [name] Date of Service: 02/05/2022 Medical Record Number: [id number] Patient Account Number: [id number] Date of Birth/Sex: 1961-01-16 (61 y.o. 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 G35 Multiple sclerosis I10 Essential (primary) hypertension Facility Procedures CPT4 Code: 99774142 Description: 99214 - WOUND CARE VISIT-LEV 4 EST PT Modifier: Quantity: 1 Physician Procedures CPT4 Code: 3953202 Description: 99214 - WC PHYS LEVEL 4 - EST PT Modifier: Quantity: 1 CPT4 Code: Description: ICD-10 Diagnosis Description T81.31XA Disruption of external operation (surgical) wound, not elsewhere classifi 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

## 2022-02-07 ENCOUNTER — Other Ambulatory Visit: Payer: Self-pay

## 2022-02-07 DIAGNOSIS — T8131XA Disruption of external operation (surgical) wound, not elsewhere classified, initial encounter: Secondary | ICD-10-CM | POA: Diagnosis not present

## 2022-02-07 NOTE — Progress Notes (Signed)
[name] [name] ([id number]) Visit Report for 02/07/2022 Arrival Information Details Patient Name: [name] [name] [name] [name]. Date of Service: 02/07/2022 3:30 PM Medical Record Number: [id number] Patient Account Number: [id number] Date of Birth/Sex: Dec 06, 1961 (61 y.o. F) Treating RN: Yevonne Pax Primary Care Kalev Temme: Card, Jonny Ruiz Other Clinician: Referring Jahden Schara: Card, John Treating Ellanie Oppedisano/Extender: Rowan Blase in Treatment: 16 Visit Information History Since Last Visit All ordered tests and consults were completed: No Patient Arrived: [name] Added or deleted any medications: No Arrival Time: 15:45 Any new allergies or adverse reactions: No Accompanied By: self Had a fall or experienced change in No Transfer Assistance: None activities of daily living that may affect Patient Identification Verified: Yes risk of falls: Secondary Verification Process Completed: Yes Signs or symptoms of abuse/neglect since last visito No Patient Requires Transmission-Based Precautions: No Hospitalized since last visit: No Patient Has Alerts: No Implantable device outside of the clinic excluding No cellular tissue based products placed in the center since last visit: Has Dressing in Place as Prescribed: Yes Pain Present Now: No Electronic Signature(s) Signed: 02/07/2022 4:01:35 PM By: Yevonne Pax RN Entered By: Yevonne Pax on 02/07/2022 16:01:34 [name] [name] C. ([id number]) -------------------------------------------------------------------------------- Clinic Level of Care Assessment Details Patient Name: [name] [name] C. Date of Service: 02/07/2022 3:30 PM Medical Record Number: [id number] Patient Account Number: [id number] Date of Birth/Sex: 1961-06-27 (61 y.o. F) Treating RN: Yevonne Pax Primary Care Cardell Rachel: Card, Jonny Ruiz Other Clinician: Referring Mirna Sutcliffe: Card, John Treating Rutilio Yellowhair/Extender: Rowan Blase in Treatment: 16 Clinic Level of Care Assessment Items TOOL 4  Quantity Score X - Use when only an EandM is performed on FOLLOW-UP visit 1 0 ASSESSMENTS - Nursing Assessment / Reassessment X - Reassessment of Co-morbidities (includes updates in patient status) 1 10 X- 1 5 Reassessment of Adherence to Treatment Plan ASSESSMENTS - Wound and Skin Assessment / Reassessment X - Simple Wound Assessment / Reassessment - one wound 1 5 []  - 0 Complex Wound Assessment / Reassessment - multiple wounds []  - 0 Dermatologic / Skin Assessment (not related to wound area) ASSESSMENTS - Focused Assessment []  - Circumferential Edema Measurements - multi extremities 0 []  - 0 Nutritional Assessment / Counseling / Intervention []  - 0 Lower Extremity Assessment (monofilament, tuning fork, pulses) []  - 0 Peripheral Arterial Disease Assessment (using hand held doppler) ASSESSMENTS - Ostomy and/or Continence Assessment and Care []  - Incontinence Assessment and Management 0 []  - 0 Ostomy Care Assessment and Management (repouching, etc.) PROCESS - Coordination of Care X - Simple Patient / Family Education for ongoing care 1 15 []  - 0 Complex (extensive) Patient / Family Education for ongoing care []  - 0 Staff obtains , Records, Test Results / Process Orders []  - 0 Staff telephones HHA, Nursing Homes / Clarify orders / etc []  - 0 Routine Transfer to another Facility (non-emergent condition) []  - 0 Routine Hospital Admission (non-emergent condition) []  - 0 New Admissions / / Ordering NPWT, Apligraf, etc. []  - 0 Emergency Hospital Admission (emergent condition) X- 1 10 Simple Discharge Coordination []  - 0 Complex (extensive) Discharge Coordination PROCESS - Special Needs []  - Pediatric / Minor Patient Management 0 []  - 0 Isolation Patient Management []  - 0 Hearing / Language / Visual special needs []  - 0 Assessment of Community assistance (transportation, D/C planning, etc.) []  - 0 Additional assistance / Altered  mentation []  - 0 Support Surface(s) Assessment (bed, cushion, seat, etc.) INTERVENTIONS - Wound Cleansing / Measurement [name], [name] ( ) X- 1 5 Simple Wound Cleansing -  one wound  - 0 Complex Wound Cleansing - multiple wounds  - 0 Wound Imaging (photographs - any number of wounds)  - 0 Wound Tracing (instead of photographs) X- 1 5 Simple Wound Measurement - one wound  - 0 Complex Wound Measurement - multiple wounds INTERVENTIONS - Wound Dressings  - Small Wound Dressing one or multiple wounds 0 X- 1 15 Medium Wound Dressing one or multiple wounds  - 0 Large Wound Dressing one or multiple wounds  - 0 Application of Medications - topical  - 0 Application of Medications - injection INTERVENTIONS - Miscellaneous  - External ear exam 0  - 0 Specimen Collection (cultures, biopsies, blood, body fluids, etc.)  - 0 Specimen(s) / Culture(s) sent or taken to Lab for analysis  - 0 Patient Transfer (multiple staff / Nurse, adult / Similar devices)  - 0 Simple Staple / Suture removal (25 or less)  - 0 Complex Staple / Suture removal (26 or more)  - 0 Hypo / Hyperglycemic Management (close monitor of Blood Glucose)  - 0 Ankle / Brachial Index (ABI) - do not check if billed separately X- 1 5 Vital Signs Has the patient been seen at the hospital within the last three years: Yes Total Score: 75 Level Of Care: New/Established - Level 2 Electronic Signature(s) Unsigned Entered ByYevonne Pax on 02/07/2022 16:04:41 Signature(s): Date(s): [name] [name] CMarland Kitchen ([id number]) -------------------------------------------------------------------------------- Encounter Discharge Information Details Patient Name: [name] [name] C. Date of Service: 02/07/2022 3:30 PM Medical Record Number: [id number] Patient Account Number: [id number] Date of Birth/Sex: 31-May-1961 (61 y.o. F) Treating RN: Yevonne Pax Primary Care Dre Gamino: [name] Other  Clinician: Referring Suzan Manon: Card, John Treating Davison Ohms/Extender: Rowan Blase in Treatment: 16 Encounter Discharge Information Items Discharge Condition: Stable Ambulatory Status: Walker Discharge Destination: Home Transportation: Private Auto Accompanied By: self Schedule Follow-up Appointment: Yes Clinical Summary of Care: Patient Declined Electronic Signature(s) Signed: 02/07/2022 4:04:11 PM By: Yevonne Pax RN Entered By: Yevonne Pax on 02/07/2022 16:04:10 [name] [name] C. ([id number]) -------------------------------------------------------------------------------- Wound Assessment Details Patient Name: [name] [name] C. Date of Service: 02/07/2022 3:30 PM Medical Record Number: [id number] Patient Account Number: [id number] Date of Birth/Sex: 10-Jul-1961 (61 y.o. F) Treating RN: Yevonne Pax Primary Care Ineta Sinning: Card, Jonny Ruiz Other Clinician: Referring Jaxin Fulfer: Card, John Treating Amirra Herling/Extender: Rowan Blase in Treatment: 16 Wound Status Wound Number: 1 Primary Etiology: Dehisced Wound Wound Location: Distal, Midline Back Wound Status: Open Wounding Event: Surgical Injury Comorbid History: Hypertension Date Acquired: 04/11/2021 Weeks Of Treatment: 16 Clustered Wound: No Wound Measurements Length: (cm) 0.3 Width: (cm) 0.3 Depth: (cm) 2.7 Area: (cm) 0.071 Volume: (cm) 0.191 % Reduction in Area: 43.7% % Reduction in Volume: 67% Epithelialization: None Tunneling: No Undermining: No Wound Description Classification: Full Thickness Without Exposed Support Structu Exudate Amount: Medium Exudate Type: Serous Exudate Color: amber res Foul Odor After Cleansing: No Slough/Fibrino No Wound Bed Granulation Amount: Large (67-100%) Exposed Structure Granulation Quality: Red Fascia Exposed: No Necrotic Amount: None Present (0%) Fat Layer (Subcutaneous Tissue) Exposed: Yes Tendon Exposed: No Muscle Exposed: No Joint Exposed: No Bone Exposed:  No Treatment Notes Wound #1 (Back) Wound Laterality: Midline, Distal Cleanser Normal Saline Discharge Instruction: Wash your hands with soap and water. Remove old dressing, discard into plastic bag and place into trash. Cleanse the wound with Normal Saline prior to applying a clean dressing using gauze sponges, not tissues or cotton balls. Do not scrub or use excessive force. Pat dry using gauze sponges, not tissue or cotton balls. Peri-Wound Care Topical Primary  Dressing Hydrofera Blue Classic Foam Rope Dressing, 9x6 (mm/in) Discharge Instruction: cut rope in 1/4 lengthwise AND LEAVE A TAIL OUT TO GRAB IT Secondary Dressing Zetuvit Plus Silicone Border Dressing 4x4 (in/in) Secured With Compression Wrap Compression Stockings [name], [name] ([id number]) Add-Ons Electronic Signature(s) Signed: 02/07/2022 4:02:12 PM By: Yevonne Pax RN Entered By: Yevonne Pax on 02/07/2022 16:02:11

## 2022-02-07 NOTE — Progress Notes (Signed)
[name], [name] ([id number]) Visit Report for 02/05/2022 Arrival Information Details Patient Name: [name], [name] Date of Service: 02/05/2022 12:30 PM Medical Record Number: [id number] Patient Account Number: [id number] Date of Birth/Sex: December 18, 1961 (61 y.o. F) Treating [name]: [name] Primary Care [name]: Card, [name] Other Clinician: Referring [name]: Card, [name] Treating Taline Nass/Extender: [name] in Treatment: 16 Visit Information History Since Last Visit All ordered tests and consults were completed: No Patient Arrived: [name] Added or deleted any medications: No Arrival Time: 12:38 Any new allergies or adverse reactions: No Accompanied By: self Had a fall or experienced change in No Transfer Assistance: None activities of daily living that may affect Patient Identification Verified: Yes risk of falls: Secondary Verification Process Completed: Yes Signs or symptoms of abuse/neglect since last visito No Patient Requires Transmission-Based Precautions: No Hospitalized since last visit: No Patient Has Alerts: No Implantable device outside of the clinic excluding No cellular tissue based products placed in the center since last visit: Has Dressing in Place as Prescribed: Yes Pain Present Now: No Electronic Signature(s) Signed: 02/07/2022 8:26:07 AM By: [name] Entered By: [name] on 02/05/2022 12:46:39 [name], [name] ([id number]) -------------------------------------------------------------------------------- Clinic Level of Care Assessment Details Patient Name: [name] Date of Service: 02/05/2022 12:30 PM Medical Record Number: [id number] Patient Account Number: [id number] Date of Birth/Sex: [birth year] (61 y.o. F) Treating [name]: [name] Primary Care Regnald Bowens: Card, [name] Other Clinician: Referring Sheba Whaling: Card, [name] Treating Amonie Wisser/Extender: [name] in Treatment: 16 Clinic Level of Care Assessment Items TOOL 4  Quantity Score  - Use when only an EandM is performed on FOLLOW-UP visit 0 ASSESSMENTS - Nursing Assessment / Reassessment X - Reassessment of Co-morbidities (includes updates in patient status) 1 10 X- 1 5 Reassessment of Adherence to Treatment Plan ASSESSMENTS - Wound and Skin Assessment / Reassessment X - Simple Wound Assessment / Reassessment - one wound 1 5  - 0 Complex Wound Assessment / Reassessment - multiple wounds  - 0 Dermatologic / Skin Assessment (not related to wound area) ASSESSMENTS - Focused Assessment  - Circumferential Edema Measurements - multi extremities 0  - 0 Nutritional Assessment / Counseling / Intervention  - 0 Lower Extremity Assessment (monofilament, tuning fork, pulses)  - 0 Peripheral Arterial Disease Assessment (using hand held doppler) ASSESSMENTS - Ostomy and/or Continence Assessment and Care  - Incontinence Assessment and Management 0  - 0 Ostomy Care Assessment and Management (repouching, etc.) PROCESS - Coordination of Care X - Simple Patient / Family Education for ongoing care 1 15  - 0 Complex (extensive) Patient / Family Education for ongoing care  - 0 Staff obtains Chiropractor, Records, Test Results / Process Orders  - 0 Staff telephones HHA, Nursing Homes / Clarify orders / etc  - 0 Routine Transfer to another Facility (non-emergent condition)  - 0 Routine Hospital Admission (non-emergent condition)  - 0 New Admissions / Manufacturing engineer / Ordering NPWT, Apligraf, etc.  - 0 Emergency Hospital Admission (emergent condition) X- 1 10 Simple Discharge Coordination  - 0 Complex (extensive) Discharge Coordination PROCESS - Special Needs  - Pediatric / Minor Patient Management 0  - 0 Isolation Patient Management  - 0 Hearing / Language / Visual special needs  - 0 Assessment of Community assistance (transportation, D/[name] planning, etc.)  - 0 Additional assistance / Altered  mentation  - 0 Support Surface(s) Assessment (bed, cushion, seat, etc.) INTERVENTIONS - Wound Cleansing / Measurement [name], [name] ([id number]) X- 1 5 Simple Wound Cleansing - one  wound []  - 0 Complex Wound Cleansing - multiple wounds X- 1 5 Wound Imaging (photographs - any number of wounds) []  - 0 Wound Tracing (instead of photographs) X- 1 5 Simple Wound Measurement - one wound []  - 0 Complex Wound Measurement - multiple wounds INTERVENTIONS - Wound Dressings []  - Small Wound Dressing one or multiple wounds 0 X- 1 15 Medium Wound Dressing one or multiple wounds []  - 0 Large Wound Dressing one or multiple wounds []  - 0 Application of Medications - topical []  - 0 Application of Medications - injection INTERVENTIONS - Miscellaneous []  - External ear exam 0 []  - 0 Specimen Collection (cultures, biopsies, blood, body fluids, etc.) []  - 0 Specimen(s) / Culture(s) sent or taken to Lab for analysis []  - 0 Patient Transfer (multiple staff / / Similar devices) []  - 0 Simple Staple / Suture removal (25 or less) []  - 0 Complex Staple / Suture removal (26 or more) []  - 0 Hypo / Hyperglycemic Management (close monitor of Blood Glucose) []  - 0 Ankle / Brachial Index (ABI) - do not check if billed separately X- 1 5 Vital Signs Has the patient been seen at the hospital within the last three years: Yes Total Score: 80 Level Of Care: New/Established - Level 3 Electronic Signature(s) Signed: 02/07/2022 8:26:07 AM By: [name] Entered By: on 02/05/2022 13:07:18 [name], [name] ( ) -------------------------------------------------------------------------------- Encounter Discharge Information Details Patient Name: Mainer, [name] Date of Service: 02/05/2022 12:30 PM Medical Record Number: Patient Account Number: Date of Birth/Sex: 13-Sep-1961 (61 y.o. F) Treating [name]: Primary Care Desera Graffeo: Other Clinician: Referring Ashanti Littles: Card, [name] Treating Jovin Fester/Extender: in Treatment: 16 Encounter Discharge Information Items Discharge Condition: Stable Ambulatory Status: Walker Discharge Destination: Home Transportation: Private Auto Accompanied By: self Schedule Follow-up Appointment: Yes Clinical Summary of Care: Patient Declined Electronic Signature(s) Signed: 02/07/2022 8:26:07 AM By: 04/07/2022 [name] Entered By: [name] on 02/05/2022 13:09:13 Belli, Prima [name]. (04/05/2022) -------------------------------------------------------------------------------- Lower Extremity Assessment Details Patient Name: Nurse, Jeaneane [name]. Date of Service: 02/05/2022 12:30 PM Medical Record Number: [id number] Patient Account Number: 04/05/2022 Date of Birth/Sex: [birth year] (61 y.o. F) Treating [name]: 09/11/1961 Primary Care Jailine Lieder: 66 Other Clinician: Referring Vivica Dobosz: Card, [name] Treating Louanna Vanliew/Extender: [name] in Treatment: 16 Electronic Signature(s) Signed: 02/07/2022 8:26:07 AM By: [name] Blase [name] Entered By: 04/07/2022 on 02/05/2022 12:48:32 [name], [name] (04/05/2022) -------------------------------------------------------------------------------- Multi Wound Chart Details Patient Name: Fielding, Annalucia [name]. Date of Service: 02/05/2022 12:30 PM Medical Record Number: 04/05/2022 Patient Account Number: [id number] Date of Birth/Sex: [birth year] (61 y.o. F) Treating [name]: 68 Primary Care Davey Limas: [name] Other Clinician: Referring Lillyth Spong: Card, [name] Treating Monserat Prestigiacomo/Extender: [name] in Treatment: 16 Vital Signs Height(in): Pulse(bpm): 76 Weight(lbs): 275 Blood Pressure(mmHg): 148/80 Body Mass Index(BMI): Temperature(F): 98.1 Respiratory Rate(breaths/min): 18 Photos: [N/A:N/A] Wound Location: Distal, Midline Back N/A N/A Wounding Event: Surgical Injury N/A N/A Primary Etiology: Dehisced Wound N/A  N/A Comorbid History: Hypertension N/A N/A Date Acquired: 04/11/2021 N/A N/A Weeks of Treatment: 16 N/A N/A Wound Status: Open N/A N/A Wound Recurrence: No N/A N/A Measurements L x W x D (cm) 0.3x0.3x2.7 N/A N/A Area (cm) : 0.071 N/A N/A Volume (cm) : 0.191 N/A N/A % Reduction in Area: 43.70% N/A N/A % Reduction in Volume: 67.00% N/A N/A Classification: Full Thickness Without Exposed N/A N/A Support Structures Exudate Amount: Medium N/A N/A Exudate Type: Serous N/A N/A Exudate Color: amber  N/A N/A Granulation Amount: Large (67-100%) N/A N/A Granulation Quality: Red N/A N/A Necrotic Amount: None Present (0%) N/A N/A Exposed Structures: Fat Layer (Subcutaneous Tissue): N/A N/A Yes Fascia: No Tendon: No Muscle: No Joint: No Bone: No Epithelialization: None N/A N/A Treatment Notes Electronic Signature(s) Signed: 02/07/2022 8:26:07 AM By: [name] Entered By: [name] on 02/05/2022 13:05:49 [name], [name] ([id number]) -------------------------------------------------------------------------------- Multi-Disciplinary Care Plan Details Patient Name: Twiford, Shaterrica [name]. Date of Service: 02/05/2022 12:30 PM Medical Record Number: [id number] Patient Account Number: [id number] Date of Birth/Sex: 1961-04-22 (61 y.o. F) Treating [name]: [name] Primary Care Kareemah Grounds: [name] Other Clinician: Referring Danuta Huseman: Card, [name] Treating Kamica Florance/Extender: [name] in Treatment: 16 Active Inactive Wound/Skin Impairment Nursing Diagnoses: Knowledge deficit related to ulceration/compromised skin integrity Goals: Patient/caregiver will verbalize understanding of skin care regimen Date Initiated: 10/15/2021 Target Resolution Date: 02/15/2022 Goal Status: Active Ulcer/skin breakdown will have a volume reduction of 30% by week 4 Date Initiated: 10/15/2021 Date Inactivated: 01/29/2022 Target Resolution Date: 12/15/2021 Goal Status: Unmet Unmet Reason:  comorbities Ulcer/skin breakdown will have a volume reduction of 50% by week 8 Date Initiated: 10/15/2021 Date Inactivated: 01/29/2022 Target Resolution Date: 01/15/2022 Goal Status: Unmet Unmet Reason: comorbities Ulcer/skin breakdown will have a volume reduction of 80% by week 12 Date Initiated: 10/15/2021 Target Resolution Date: 02/15/2022 Goal Status: Active Ulcer/skin breakdown will heal within 14 weeks Date Initiated: 10/15/2021 Target Resolution Date: 03/15/2022 Goal Status: Active Interventions: Assess patient/caregiver ability to obtain necessary supplies Assess patient/caregiver ability to perform ulcer/skin care regimen upon admission and as needed Assess ulceration(s) every visit Notes: Electronic Signature(s) Signed: 02/07/2022 8:26:07 AM By: [name] Entered By: [name] on 02/05/2022 13:05:35 [name], [name] ([id number]) -------------------------------------------------------------------------------- Pain Assessment Details Patient Name: [name], [name] Date of Service: 02/05/2022 12:30 PM Medical Record Number: [id number] Patient Account Number: [id number] Date of Birth/Sex: 12-15-1961 (61 y.o. F) Treating [name]: [name] Primary Care Anavey Coombes: [name] Other Clinician: Referring Marialena Wollen: Card, [name] Treating Myiesha Edgar/Extender: [name] in Treatment: 16 Active Problems Location of Pain Severity and Description of Pain Patient Has Paino Yes Site Locations With Dressing Change: No Duration of the Pain. Constant / Intermittento Intermittent How Long Does it Lasto Hours: Minutes: 15 Rate the pain. Current Pain Level: 4 Worst Pain Level: 6 Least Pain Level: 0 Tolerable Pain Level: 5 Character of Pain Describe the Pain: Aching Pain Management and Medication Current Pain Management: Medication: Yes Cold Application: No Rest: Yes Massage: No Activity: No T.E.N.S.: No Heat Application: No Leg drop or elevation: No Is the Current  Pain Management Adequate: Inadequate How does your wound impact your activities of daily livingo Sleep: No Bathing: No Appetite: No Relationship With Others: No Bladder Continence: No Emotions: No Bowel Continence: No Work: No Toileting: No Drive: No Dressing: No Hobbies: No Electronic Signature(s) Signed: 02/07/2022 8:26:07 AM By: [name] Entered By: [name] on 02/05/2022 12:47:41 [name], [name] ([id number]) -------------------------------------------------------------------------------- Patient/Caregiver Education Details Patient Name: [name], [name] Date of Service: 02/05/2022 12:30 PM Medical Record Number: [id number] Patient Account Number: [id number] Date of Birth/Gender: March 27, 1961 (61 y.o. F) Treating [name]: [name] Primary Care Physician: [name] Other Clinician: Referring Physician: Card, [name] Treating Physician/Extender: [name] in Treatment: 16 Education Assessment Education Provided To: Patient Education Topics Provided Wound/Skin Impairment: Methods: Explain/Verbal Responses: State content correctly Electronic Signature(s) Signed: 02/07/2022 8:26:07 AM By: [name] Entered By: [name] on 02/05/2022 13:08:21 [name], [name] ([id number]) -------------------------------------------------------------------------------- Wound Assessment Details  Patient Name: Vizcarrondo, Emanii [name]. Date of Service: 02/05/2022 12:30 PM Medical Record Number: [id number] Patient Account Number: [id number] Date of Birth/Sex: [birth year] (61 y.o. F) Treating [name]: [name] Primary Care Rasmus Preusser: Card, [name] Other Clinician: Referring Kamori Kitchens: Card, [name] Treating Mikko Lewellen/Extender: [name] in Treatment: 16 Wound Status Wound Number: 1 Primary Etiology: Dehisced Wound Wound Location: Distal, Midline Back Wound Status: Open Wounding Event: Surgical Injury Comorbid History: Hypertension Date Acquired: 04/11/2021 Weeks Of  Treatment: 16 Clustered Wound: No Photos Wound Measurements Length: (cm) 0.3 Width: (cm) 0.3 Depth: (cm) 2.7 Area: (cm) 0.071 Volume: (cm) 0.191 % Reduction in Area: 43.7% % Reduction in Volume: 67% Epithelialization: None Tunneling: No Undermining: No Wound Description Classification: Full Thickness Without Exposed Support Structures Exudate Amount: Medium Exudate Type: Serous Exudate Color: amber Foul Odor After Cleansing: No Slough/Fibrino No Wound Bed Granulation Amount: Large (67-100%) Exposed Structure Granulation Quality: Red Fascia Exposed: No Necrotic Amount: None Present (0%) Fat Layer (Subcutaneous Tissue) Exposed: Yes Tendon Exposed: No Muscle Exposed: No Joint Exposed: No Bone Exposed: No Treatment Notes Wound #1 (Back) Wound Laterality: Midline, Distal Cleanser Normal Saline Discharge Instruction: Wash your hands with soap and water. Remove old dressing, discard into plastic bag and place into trash. Cleanse the wound with Normal Saline prior to applying a clean dressing using gauze sponges, not tissues or cotton balls. Do not scrub or use excessive force. Pat dry using gauze sponges, not tissue or cotton balls. Yurchak, Shantea [name]. ([id number]) Peri-Wound Care Topical Primary Dressing Hydrofera Blue Classic Foam Rope Dressing, 9x6 (mm/in) Discharge Instruction: cut rope in 1/4 lengthwise AND LEAVE A TAIL OUT TO GRAB IT Secondary Dressing Zetuvit Plus Silicone Border Dressing 4x4 (in/in) Secured With Compression Wrap Compression Stockings Add-Ons Electronic Signature(s) Signed: 02/07/2022 8:26:07 AM By: [name] Entered By: [name] on 02/05/2022 12:48:14 Pentecost, Monserat [name] Kitchen ([id number]) -------------------------------------------------------------------------------- Vitals Details Patient Name: Limon, Alexx [name]. Date of Service: 02/05/2022 12:30 PM Medical Record Number: [id number] Patient Account Number: [id number] Date of Birth/Sex:  1961/10/31 (61 y.o. F) Treating [name]: [name] Primary Care Graeme Menees: [name] Other Clinician: Referring Jerzy Roepke: Card, [name] Treating Meliza Kage/Extender: [name] in Treatment: 16 Vital Signs Time Taken: 12:46 Temperature (F): 98.1 Weight (lbs): 275 Pulse (bpm): 76 Respiratory Rate (breaths/min): 18 Blood Pressure (mmHg): 148/80 Reference Range: 80 - 120 mg / dl Electronic Signature(s) Signed: 02/07/2022 8:26:07 AM By: [name] Entered By: [name] on 02/05/2022 12:47:02

## 2022-02-08 NOTE — Progress Notes (Signed)
[name], [name] ([id number]) Visit Report for 02/07/2022 Physician Orders Details Patient Name: Kleier, Vernis C. Date of Service: 02/07/2022 3:30 PM Medical Record Number: [id number] Patient Account Number: [id number] Date of Birth/Sex: 09-27-1961 (61 y.o. F) Treating RN: Carlene Coria Primary Care Provider: Clayborn Heron Other Clinician: Referring Provider: Card, John Treating Provider/Extender: Skipper Cliche in Treatment: 14 Verbal / Phone Orders: No Diagnosis Coding Follow-up Appointments o Return Appointment in 1 week. o Nurse Visit as needed - nurse visit on tuesday and Orin: - [facility] for wound care. May utilize formulary equivalent dressing for wound treatment orders unless otherwise specified. Home Health Nurse may visit [facility] to address patientos wound care needs. - 3 times per week [facility] fax [phone] Bathing/ Shower/ Hygiene o May shower; gently cleanse wound with antibacterial soap, rinse and pat dry prior to dressing wounds o No tub bath. Anesthetic (Use 'Patient Medications' Section for Anesthetic Order Entry) o Lidocaine applied to wound bed Edema Control - Lymphedema / Segmental Compressive Device / Other o Elevate, Exercise Daily and Avoid Standing for Long Periods of Time. o Elevate legs to the level of the heart and pump ankles as often as possible o Elevate leg(s) parallel to the floor when sitting. Additional Orders / Instructions o Follow Nutritious Diet and Increase Protein Intake Wound Treatment Wound #1 - Back Wound Laterality: Midline, Distal Cleanser: Normal Saline 1 x Per Day/30 Days Discharge Instructions: Wash your hands with soap and water. Remove old dressing, discard into plastic bag and place into trash. Cleanse the wound with Normal Saline prior to applying a clean dressing using gauze sponges, not tissues or cotton balls. Do not scrub or use excessive force.  Pat dry using gauze sponges, not tissue or cotton balls. Primary Dressing: Hydrofera Blue Classic Foam Rope Dressing, 9x6 (mm/in) 1 x Per Day/30 Days Discharge Instructions: cut rope in 1/4 lengthwise AND LEAVE A TAIL OUT TO GRAB IT Secondary Dressing: Zetuvit Plus Silicone Border Dressing 4x4 (in/in) 1 x Per Day/30 Days Electronic Signature(s) Signed: 02/07/2022 4:03:30 PM By: Carlene Coria RN Signed: 02/07/2022 5:14:54 PM By: Worthy Keeler PA-C Entered By: Carlene Coria on 02/07/2022 16:03:29 Kimbell, Claudina C. ([id number]) -------------------------------------------------------------------------------- SuperBill Details Patient Name: Schnarr, Nylani C. Date of Service: 02/07/2022 Medical Record Number: [id number] Patient Account Number: [id number] Date of Birth/Sex: 30-Apr-1961 (61 y.o. 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

## 2022-02-12 ENCOUNTER — Other Ambulatory Visit: Payer: Self-pay

## 2022-02-12 DIAGNOSIS — T8131XA Disruption of external operation (surgical) wound, not elsewhere classified, initial encounter: Secondary | ICD-10-CM | POA: Diagnosis not present

## 2022-02-12 NOTE — Progress Notes (Signed)
[name], [name] ([id number]) Visit Report for 02/12/2022 Arrival Information Details Patient Name: [name], [name] Date of Service: 02/12/2022 1:30 PM Medical Record Number: [id number] Patient Account Number: [id number] Date of Birth/Sex: 10/17/1961 (61 y.o. F) Treating RN: [name] Primary Care [name]: [name] Other Clinician: Referring [name]: Card, [name] Treating Oral Hallgren/Extender: [name] in Treatment: 17 Visit Information History Since Last Visit Added or deleted any medications: No Patient Arrived: [name] Any new allergies or adverse reactions: No Arrival Time: 13:04 Had a fall or experienced change in No Accompanied By: self activities of daily living that may affect Transfer Assistance: None risk of falls: Patient Identification Verified: Yes Hospitalized since last visit: No Secondary Verification Process Completed: Yes Has Dressing in Place as Prescribed: Yes Patient Requires Transmission-Based Precautions: No Pain Present Now: No Patient Has Alerts: No Electronic Signature(s) Signed: 02/12/2022 1:43:43 PM By: [name] Entered By: [name] on 02/12/2022 13:14:14 [name], [name] ([id number]) -------------------------------------------------------------------------------- Clinic Level of Care Assessment Details Patient Name: [name], [name] Date of Service: 02/12/2022 1:30 PM Medical Record Number: [id number] Patient Account Number: [id number] Date of Birth/Sex: [birth year] (61 y.o. F) Treating RN: [name] Primary Care [name]: Card, [name] Other Clinician: Referring [name]: Card, [name] Treating [name]/Extender: [name] in Treatment: 17 Clinic Level of Care Assessment Items TOOL 4 Quantity Score []  - Use when only an EandM is performed on FOLLOW-UP visit 0 ASSESSMENTS - Nursing Assessment / Reassessment []  - Reassessment of Co-morbidities (includes updates in patient status) 0 []  - 0 Reassessment  of Adherence to Treatment Plan ASSESSMENTS - Wound and Skin Assessment / Reassessment X - Simple Wound Assessment / Reassessment - one wound 1 5 []  - 0 Complex Wound Assessment / Reassessment - multiple wounds []  - 0 Dermatologic / Skin Assessment (not related to wound area) ASSESSMENTS - Focused Assessment []  - Circumferential Edema Measurements - multi extremities 0 []  - 0 Nutritional Assessment / Counseling / Intervention []  - 0 Lower Extremity Assessment (monofilament, tuning fork, pulses) []  - 0 Peripheral Arterial Disease Assessment (using hand held doppler) ASSESSMENTS - Ostomy and/or Continence Assessment and Care []  - Incontinence Assessment and Management 0 []  - 0 Ostomy Care Assessment and Management (repouching, etc.) PROCESS - Coordination of Care X - Simple Patient / Family Education for ongoing care 1 15 []  - 0 Complex (extensive) Patient / Family Education for ongoing care []  - 0 Staff obtains Chiropractor, Records, Test Results / Process Orders []  - 0 Staff telephones HHA, Nursing Homes / Clarify orders / etc []  - 0 Routine Transfer to another Facility (non-emergent condition) []  - 0 Routine Hospital Admission (non-emergent condition) []  - 0 New Admissions / Manufacturing engineer / Ordering NPWT, Apligraf, etc. []  - 0 Emergency Hospital Admission (emergent condition) X- 1 10 Simple Discharge Coordination []  - 0 Complex (extensive) Discharge Coordination PROCESS - Special Needs []  - Pediatric / Minor Patient Management 0 []  - 0 Isolation Patient Management []  - 0 Hearing / Language / Visual special needs []  - 0 Assessment of Community assistance (transportation, D/C planning, etc.) []  - 0 Additional assistance / Altered mentation []  - 0 Support Surface(s) Assessment (bed, cushion, seat, etc.) INTERVENTIONS - Wound Cleansing / Measurement [name], [name] ([id number]) X- 1 5 Simple Wound Cleansing - one wound []  - 0 Complex Wound Cleansing -  multiple wounds []  - 0 Wound Imaging (photographs - any number of wounds) []  - 0 Wound Tracing (instead of photographs) []  - 0 Simple Wound Measurement - one wound []  -  0 Complex Wound Measurement - multiple wounds INTERVENTIONS - Wound Dressings X - Small Wound Dressing one or multiple wounds 1 10 []  - 0 Medium Wound Dressing one or multiple wounds []  - 0 Large Wound Dressing one or multiple wounds []  - 0 Application of Medications - topical []  - 0 Application of Medications - injection INTERVENTIONS - Miscellaneous []  - External ear exam 0 []  - 0 Specimen Collection (cultures, biopsies, blood, body fluids, etc.) []  - 0 Specimen(s) / Culture(s) sent or taken to Lab for analysis []  - 0 Patient Transfer (multiple staff / / Similar devices) []  - 0 Simple Staple / Suture removal (25 or less) []  - 0 Complex Staple / Suture removal (26 or more) []  - 0 Hypo / Hyperglycemic Management (close monitor of Blood Glucose) []  - 0 Ankle / Brachial Index (ABI) - do not check if billed separately []  - 0 Vital Signs Has the patient been seen at the hospital within the last three years: Yes Total Score: 45 Level Of Care: New/Established - Level 2 Electronic Signature(s) Signed: 02/12/2022 1:43:43 PM By: Entered By: on 02/12/2022 13:26:10 [name], [name] ( ) -------------------------------------------------------------------------------- Encounter Discharge Information Details Patient Name: [name], [name] Date of Service: 02/12/2022 1:30 PM Medical Record Number: Nurse, adult Patient Account Number: Date of Birth/Sex: 08-07-1961 (61 y.o. F) Treating RN: Primary Care [name]: Other Clinician: Referring [name]: Card, [name] Treating [name]/Extender: 02/14/2022 in Treatment: 17 Encounter Discharge Information Items Discharge Condition: Stable Ambulatory Status: Walker Discharge  Destination: Home Transportation: Private Auto Accompanied By: self Schedule Follow-up Appointment: Yes Clinical Summary of Care: Electronic Signature(s) Signed: 02/12/2022 1:43:43 PM By: 02/14/2022 Entered By: Salena Saner on 02/12/2022 13:25:52 [name], [name] (02/14/2022) -------------------------------------------------------------------------------- Wound Assessment Details Patient Name: [name], [name] Date of Service: 02/12/2022 1:30 PM Medical Record Number: [id number] Patient Account Number: 09/11/1961 Date of Birth/Sex: [birth year] (61 y.o. F) Treating RN: [name] Primary Care Daquan Crapps: Card, [name] Other Clinician: Referring Lonnie Rosado: Card, [name] Treating Draysen Weygandt/Extender: 18 in Treatment: 17 Wound Status Wound Number: 1 Primary Etiology: Dehisced Wound Wound Location: Distal, Midline Back Wound Status: Open Wounding Event: Surgical Injury Comorbid History: Hypertension Date Acquired: 04/11/2021 Weeks Of Treatment: 17 Clustered Wound: No Wound Measurements Length: (cm) 0.3 Width: (cm) 0.3 Depth: (cm) 2.7 Area: (cm) 0.071 Volume: (cm) 0.191 % Reduction in Area: 43.7% % Reduction in Volume: 67% Epithelialization: None Tunneling: No Undermining: No Wound Description Classification: Full Thickness Without Exposed Support Structures Exudate Amount: Medium Exudate Type: Serous Exudate Color: amber Foul Odor After Cleansing: No Slough/Fibrino No Wound Bed Granulation Amount: Large (67-100%) Exposed Structure Granulation Quality: Red Fascia Exposed: No Necrotic Amount: None Present (0%) Fat Layer (Subcutaneous Tissue) Exposed: Yes Tendon Exposed: No Muscle Exposed: No Joint Exposed: No Bone Exposed: No Assessment Notes blueish hue/clear drainage when rope removed. new dressing placed, pt tolerated without issue. pt sent with pre cut hydrofera blue pieces for home health Treatment Notes Wound #1 (Back) Wound Laterality:  Midline, Distal Cleanser Normal Saline Discharge Instruction: Wash your hands with soap and water. Remove old dressing, discard into plastic bag and place into trash. Cleanse the wound with Normal Saline prior to applying a clean dressing using gauze sponges, not tissues or cotton balls. Do not scrub or use excessive force. Pat dry using gauze sponges, not tissue or cotton balls. Peri-Wound Care Topical Primary Dressing Hydrofera Blue Classic Foam Rope Dressing, 9x6 (mm/in) Discharge Instruction: cut rope in  1/4 lengthwise AND LEAVE A TAIL OUT TO GRAB IT Secondary Dressing Zetuvit Plus Silicone Border Dressing 4x4 (in/in) Secured With [name], [name] ([id number]) Compression Wrap Compression Stockings Add-Ons Electronic Signature(s) Signed: 02/12/2022 1:43:43 PM By: [name] Entered By: [name] on 02/12/2022 13:23:16

## 2022-02-12 NOTE — Progress Notes (Signed)
RAYANNE, PADMANABHAN ([id number]) Visit Report for 02/12/2022 Physician Orders Details Patient Name: [name], [name] Date of Service: 02/12/2022 1:30 PM Medical Record Number: [id number] Patient Account Number: [id number] Date of Birth/Sex: 1961/03/28 (61 y.o. F) Treating RN: Angelina Pih Primary Care Provider: [name] Other Clinician: Referring Provider: Card, John Treating Provider/Extender: Rowan Blase in Treatment: [phone] Verbal / Phone Orders: No Diagnosis Coding Follow-up Appointments o Return Appointment in 1 week. o Nurse Visit as needed - nurse visit on tuesday and thursday Home Health o [facility]: [facility] o [facility] for wound care. May utilize formulary equivalent dressing for wound treatment orders unless otherwise specified. Home Health Nurse may visit PRN to address patientos wound care needs. - 3 times per week [facility] fax [phone] Bathing/ Shower/ Hygiene o May shower; gently cleanse wound with antibacterial soap, rinse and pat dry prior to dressing wounds o No tub bath. Anesthetic (Use 'Patient Medications' Section for Anesthetic Order Entry) o Lidocaine applied to wound bed Edema Control - Lymphedema / Segmental Compressive Device / Other o Elevate, Exercise Daily and Avoid Standing for Long Periods of Time. o Elevate legs to the level of the heart and pump ankles as often as possible o Elevate leg(s) parallel to the floor when sitting. Additional Orders / Instructions o Follow Nutritious Diet and Increase Protein Intake Wound Treatment Wound #1 - Back Wound Laterality: Midline, Distal Cleanser: Normal Saline 1 x Per Day/30 Days Discharge Instructions: Wash your hands with soap and water. Remove old dressing, discard into plastic bag and place into trash. Cleanse the wound with Normal Saline prior to applying a clean dressing using gauze sponges, not tissues or cotton balls. Do not scrub or use excessive  force. Pat dry using gauze sponges, not tissue or cotton balls. Primary Dressing: Hydrofera Blue Classic Foam Rope Dressing, 9x6 (mm/in) 1 x Per Day/30 Days Discharge Instructions: cut rope in 1/4 lengthwise AND LEAVE A TAIL OUT TO GRAB IT Secondary Dressing: Zetuvit Plus Silicone Border Dressing 4x4 (in/in) 1 x Per Day/30 Days Electronic Signature(s) Signed: 02/12/2022 1:43:43 PM By: Angelina Pih Signed: 02/12/2022 2:51:33 PM By: [name] PA-[name] Entered By: Angelina Pih on 02/12/2022 13:25:25 Cliburn, Sophea [name]. ([id number]) -------------------------------------------------------------------------------- SuperBill Details Patient Name: [name] Date of Service: 02/12/2022 Medical Record Number: [id number] Patient Account Number: [id number] Date of Birth/Sex: 1961/03/15 (61 y.o. 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

## 2022-02-14 ENCOUNTER — Other Ambulatory Visit: Payer: Self-pay

## 2022-02-14 ENCOUNTER — Ambulatory Visit: Payer: 59 | Attending: Geriatric Medicine | Admitting: Occupational Therapy

## 2022-02-14 ENCOUNTER — Encounter: Payer: Self-pay | Admitting: Occupational Therapy

## 2022-02-14 DIAGNOSIS — I890 Lymphedema, not elsewhere classified: Secondary | ICD-10-CM | POA: Insufficient documentation

## 2022-02-14 DIAGNOSIS — T8131XA Disruption of external operation (surgical) wound, not elsewhere classified, initial encounter: Secondary | ICD-10-CM | POA: Diagnosis not present

## 2022-02-14 NOTE — Progress Notes (Signed)
[name] ([id number]) Visit Report for 02/14/2022 Physician Orders Details Patient Name: [name], [name] Date of Service: 02/14/2022 1:15 PM Medical Record Number: [id number] Patient Account Number: [id number] Date of Birth/Sex: 1961-12-04 (61 y.o. F) Treating RN: Yevonne Pax Primary Care Provider: [name] Other Clinician: Referring Provider: Card, John Treating Provider/Extender: Rowan Blase in Treatment: 28 Verbal / Phone Orders: No Diagnosis Coding Follow-up Appointments o Return Appointment in 1 week. o Nurse Visit as needed - nurse visit on tuesday and thursday Home Health o [facility]: [facility] o [facility] for wound care. May utilize formulary equivalent dressing for wound treatment orders unless otherwise specified. Home Health Nurse may visit PRN to address patientos wound care needs. - 3 times per week [facility] fax [phone] Bathing/ Shower/ Hygiene o May shower; gently cleanse wound with antibacterial soap, rinse and pat dry prior to dressing wounds o No tub bath. Anesthetic (Use 'Patient Medications' Section for Anesthetic Order Entry) o Lidocaine applied to wound bed Edema Control - Lymphedema / Segmental Compressive Device / Other o Elevate, Exercise Daily and Avoid Standing for Long Periods of Time. o Elevate legs to the level of the heart and pump ankles as often as possible o Elevate leg(s) parallel to the floor when sitting. Additional Orders / Instructions o Follow Nutritious Diet and Increase Protein Intake Wound Treatment Wound #1 - Back Wound Laterality: Midline, Distal Cleanser: Normal Saline 1 x Per Day/30 Days Discharge Instructions: Wash your hands with soap and water. Remove old dressing, discard into plastic bag and place into trash. Cleanse the wound with Normal Saline prior to applying a clean dressing using gauze sponges, not tissues or cotton balls. Do not scrub or use excessive force.  Pat dry using gauze sponges, not tissue or cotton balls. Primary Dressing: Hydrofera Blue Classic Foam Rope Dressing, 9x6 (mm/in) 1 x Per Day/30 Days Discharge Instructions: cut rope in 1/4 lengthwise AND LEAVE A TAIL OUT TO GRAB IT Secondary Dressing: Zetuvit Plus Silicone Border Dressing 4x4 (in/in) 1 x Per Day/30 Days Electronic Signature(s) Signed: 02/14/2022 1:15:29 PM By: Yevonne Pax RN Signed: 02/14/2022 2:30:26 PM By: [name] PA-C Entered By: Yevonne Pax on 02/14/2022 13:15:28 [name], [name] ([id number]) -------------------------------------------------------------------------------- SuperBill Details Patient Name: [name], [name] Date of Service: 02/14/2022 Medical Record Number: [id number] Patient Account Number: [id number] Date of Birth/Sex: 02-14-1961 (61 y.o. 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

## 2022-02-14 NOTE — Progress Notes (Signed)
[name] ([id number]) Visit Report for 02/14/2022 Arrival Information Details Patient Name: [name] Date of Service: 02/14/2022 1:15 PM Medical Record Number: [id number] Patient Account Number: [id number] Date of Birth/Sex: October 10, 1961 (61 y.o. F) Treating RN: Yevonne Pax Primary Care Khambrel Amsden: [name] Other Clinician: Referring [name]: Card, John Treating Niamh Rada/Extender: Rowan [name] in Treatment: 17 Visit Information History Since Last Visit All ordered tests and consults were completed: No Patient Arrived: [name] Added or deleted any medications: No Arrival Time: 13:00 Any new allergies or adverse reactions: No Accompanied By: self Had a fall or experienced change in No Transfer Assistance: None activities of daily living that may affect Patient Identification Verified: Yes risk of falls: Secondary Verification Process Completed: Yes Signs or symptoms of abuse/neglect since last visito No Patient Requires Transmission-Based Precautions: No Hospitalized since last visit: No Patient Has Alerts: No Implantable device outside of the clinic excluding No cellular tissue based products placed in the center since last visit: Has Dressing in Place as Prescribed: Yes Pain Present Now: No Electronic Signature(s) Signed: 02/14/2022 1:14:17 PM By: Yevonne Pax RN Entered By: Yevonne Pax on 02/14/2022 13:14:17 [name], [name] ([id number]) -------------------------------------------------------------------------------- Clinic Level of Care Assessment Details Patient Name: [name], Griselle [name]. Date of Service: 02/14/2022 1:15 PM Medical Record Number: [id number] Patient Account Number: [id number] Date of Birth/Sex: 1961/10/07 (61 y.o. F) Treating RN: Yevonne Pax Primary Care Leshia Kope: [name] Other Clinician: Referring [name]: Card, John Treating [name] in Treatment: 17 Clinic Level of Care Assessment Items TOOL 4  Quantity Score X - Use when only an EandM is performed on FOLLOW-UP visit 1 0 ASSESSMENTS - Nursing Assessment / Reassessment X - Reassessment of Co-morbidities (includes updates in patient status) 1 10 X- 1 5 Reassessment of Adherence to Treatment Plan ASSESSMENTS - Wound and Skin Assessment / Reassessment X - Simple Wound Assessment / Reassessment - one wound 1 5 []  - 0 Complex Wound Assessment / Reassessment - multiple wounds []  - 0 Dermatologic / Skin Assessment (not related to wound area) ASSESSMENTS - Focused Assessment []  - Circumferential Edema Measurements - multi extremities 0 []  - 0 Nutritional Assessment / Counseling / Intervention []  - 0 Lower Extremity Assessment (monofilament, tuning fork, pulses) []  - 0 Peripheral Arterial Disease Assessment (using hand held doppler) ASSESSMENTS - Ostomy and/or Continence Assessment and Care []  - Incontinence Assessment and Management 0 []  - 0 Ostomy Care Assessment and Management (repouching, etc.) PROCESS - Coordination of Care X - Simple Patient / Family Education for ongoing care 1 15 []  - 0 Complex (extensive) Patient / Family Education for ongoing care []  - 0 Staff obtains Chiropractor, Records, Test Results / Process Orders []  - 0 Staff telephones HHA, Nursing Homes / Clarify orders / etc []  - 0 Routine Transfer to another Facility (non-emergent condition) []  - 0 Routine Hospital Admission (non-emergent condition) []  - 0 New Admissions / Manufacturing engineer / Ordering NPWT, Apligraf, etc. []  - 0 Emergency Hospital Admission (emergent condition) X- 1 10 Simple Discharge Coordination []  - 0 Complex (extensive) Discharge Coordination PROCESS - Special Needs []  - Pediatric / Minor Patient Management 0 []  - 0 Isolation Patient Management []  - 0 Hearing / Language / Visual special needs []  - 0 Assessment of Community assistance (transportation, D/[name] planning, etc.) []  - 0 Additional assistance / Altered  mentation []  - 0 Support Surface(s) Assessment (bed, cushion, seat, etc.) INTERVENTIONS - Wound Cleansing / Measurement [name], [name] ([id number]) X- 1 5 Simple Wound Cleansing -  one wound []  - 0 Complex Wound Cleansing - multiple wounds []  - 0 Wound Imaging (photographs - any number of wounds) []  - 0 Wound Tracing (instead of photographs) X- 1 5 Simple Wound Measurement - one wound []  - 0 Complex Wound Measurement - multiple wounds INTERVENTIONS - Wound Dressings []  - Small Wound Dressing one or multiple wounds 0 X- 1 15 Medium Wound Dressing one or multiple wounds []  - 0 Large Wound Dressing one or multiple wounds []  - 0 Application of Medications - topical []  - 0 Application of Medications - injection INTERVENTIONS - Miscellaneous []  - External ear exam 0 []  - 0 Specimen Collection (cultures, biopsies, blood, body fluids, etc.) []  - 0 Specimen(s) / Culture(s) sent or taken to Lab for analysis []  - 0 Patient Transfer (multiple staff / / Similar devices) []  - 0 Simple Staple / Suture removal (25 or less) []  - 0 Complex Staple / Suture removal (26 or more) []  - 0 Hypo / Hyperglycemic Management (close monitor of Blood Glucose) []  - 0 Ankle / Brachial Index (ABI) - do not check if billed separately []  - 0 Vital Signs Has the patient been seen at the hospital within the last three years: Yes Total Score: 70 Level Of Care: New/Established - Level 2 Electronic Signature(s) Unsigned Entered By on 02/14/2022 13:16:27 Signature(s): Date(s): [name] ( ) -------------------------------------------------------------------------------- Encounter Discharge Information Details Patient Name: [name], Zahirah [name]. Date of Service: 02/14/2022 1:15 PM Medical Record Number: Patient Account Number: Date of Birth/Sex: June 14, 1961 (61 y.o. F) Treating RN: Primary Care Keshav Winegar: Other  Clinician: Referring Bresha Hosack: Card, John Treating Jovon Streetman/Extender: Nurse, adult in Treatment: 17 Encounter Discharge Information Items Discharge Condition: Stable Ambulatory Status: Walker Discharge Destination: Home Transportation: Private Auto Accompanied By: self Schedule Follow-up Appointment: Yes Clinical Summary of Care: Patient Declined Electronic Signature(s) Signed: 02/14/2022 1:15:59 PM By: RN Entered By: on 02/14/2022 13:15:59 [name], [name] (Yevonne Pax) -------------------------------------------------------------------------------- Wound Assessment Details Patient Name: [name], [name] Date of Service: 02/14/2022 1:15 PM Medical Record Number: Marland Kitchen Patient Account Number: [id number] Date of Birth/Sex: 02-07-1961 (61 y.o. F) Treating RN: [id number] Primary Care Rashada Klontz: Card, 09/11/1961 Other Clinician: Referring Jeraldean Wechter: Card, John Treating Kelena Garrow/Extender: 66 in Treatment: 17 Wound Status Wound Number: 1 Primary Etiology: Dehisced Wound Wound Location: Distal, Midline Back Wound Status: Open Wounding Event: Surgical Injury Comorbid History: Hypertension Date Acquired: 04/11/2021 Weeks Of Treatment: 17 Clustered Wound: No Wound Measurements Length: (cm) 0.3 Width: (cm) 0.3 Depth: (cm) 2.7 Area: (cm) 0.071 Volume: (cm) 0.191 % Reduction in Area: 43.7% % Reduction in Volume: 67% Epithelialization: None Tunneling: No Undermining: No Wound Description Classification: Full Thickness Without Exposed Support Structu Exudate Amount: Medium Exudate Type: Serous Exudate Color: amber res Foul Odor After Cleansing: No Slough/Fibrino No Wound Bed Granulation Amount: Large (67-100%) Exposed Structure Granulation Quality: Red Fascia Exposed: No Necrotic Amount: None Present (0%) Fat Layer (Subcutaneous Tissue) Exposed: Yes Tendon Exposed: No Muscle Exposed: No Joint Exposed: No Bone Exposed:  No Treatment Notes Wound #1 (Back) Wound Laterality: Midline, Distal Cleanser Normal Saline Discharge Instruction: Wash your hands with soap and water. Remove old dressing, discard into plastic bag and place into trash. Cleanse the wound with Normal Saline prior to applying a clean dressing using gauze sponges, not tissues or cotton balls. Do not scrub or use excessive force. Pat dry using gauze sponges, not tissue or cotton balls. Peri-Wound Care Topical Primary  Dressing Hydrofera Blue Classic Foam Rope Dressing, 9x6 (mm/in) Discharge Instruction: cut rope in 1/4 lengthwise AND LEAVE A TAIL OUT TO GRAB IT Secondary Dressing Zetuvit Plus Silicone Border Dressing 4x4 (in/in) Secured With Compression Wrap Compression Stockings [name], [name] ([id number]) Add-Ons Electronic Signature(s) Signed: 02/14/2022 1:14:46 PM By: Yevonne Pax RN Entered By: Yevonne Pax on 02/14/2022 13:14:45

## 2022-02-15 NOTE — Therapy (Addendum)
[place] MAIN [facility] SERVICES [address] [place], [name], [address] Phone: [phone]   Fax:  [phone]  Occupational Therapy Evaluation  Patient Details  Name: [name] MRN: [id number] Date of Birth: 05-08-1961 (61 years old) Referring Provider (OT): Donella Stade Arringtonm FNP   Encounter Date: 02/14/2022   OT End of Session - 02/14/22 1514     Visit Number 1    Number of Visits 36    Date for OT Re-Evaluation 05/15/22    OT Start Time 0205    OT Stop Time 0345    OT Time Calculation (min) 100 min    Activity Tolerance Patient tolerated treatment well;No increased pain    Behavior During Therapy WFL for tasks assessed/performed             Past Medical History:  Diagnosis Date   Arthritis    Back pain    Chronic knee pain    Edema of both lower extremities    High blood pressure    High cholesterol    Joint pain    Multiple sclerosis ([place])    Neuromuscular disorder (HCC)    Multiple Sclerosis over 20 years   Obesity    Vitamin D deficiency     Past Surgical History:  Procedure Laterality Date   APPENDECTOMY     CESAREAN SECTION     HAND SURGERY     lumbar back surgery     LUMBAR WOUND DEBRIDEMENT N/A 12/05/2020   Procedure: Lumbar Wound Exploration;  Surgeon: Ashok Pall, MD;  Location: [place];  Service: Neurosurgery;  Laterality: N/A;  3C/RM 21   LUMBAR WOUND DEBRIDEMENT N/A 02/21/2021   Procedure: Lumbar wound exploration;  Surgeon: Ashok Pall, MD;  Location: [place];  Service: Neurosurgery;  Laterality: N/A;  posterior   LUMBAR WOUND DEBRIDEMENT N/A 04/11/2021   Procedure: Wound Exploration with Wound Vac Placement;  Surgeon: Ashok Pall, MD;  Location: [place];  Service: Neurosurgery;  Laterality: N/A;   ROTATOR CUFF REPAIR     TONSILLECTOMY      There were no vitals filed for this visit.   Subjective Assessment - 02/14/22 1425     Subjective  [name] is referred to [place], FNP for evaluation and treatment of BLE lymphedema. Ms. Kea reports sudden onset of bilateral leg swelling during Covid without known precipitating event. Pt denies known family Hx of limb swelling. She reports she does not know what makes her leg swelling better , or worse, and states they swelling does not reduce with elevation, or  over night. She states swelling does not seem to worsen with dependent positioning. Pt has not previviously undergone formal lymphedema therapy in the past and she does not wear compression garments. She tells me that she can gain 8 pounds in a week from fluid; and she does report that her PCP recently prescribed Amlodipine, a calcium channel blocker, to help with control of HTN. Pt's goal for OT and LE care is, "to get my legs better so I can do more."    Pertinent History B knee OA, HTN, Obesity, MS, Spondylolisthesis-lumbar, open wound at lumbar surgical site since 4/22.    Limitations difficulty walking, impaired transfers, chronic OA pain in bilateral knees, non healing post surgical lumbar wound, BLE muscle weakness 2/2 MS, chronic leg swelling with minimal pain,    Special Tests - Stemmer bilaterally; Intake FOTO score =31/100    Patient Stated Goals get my legs better so  I can do more    Currently in Pain? Yes    Pain Location Leg    Pain Orientation Right;Left    Pain Descriptors / Indicators Heaviness;Tiring;Discomfort    Pain Type Chronic pain   6 months w insideous onset of swelling   Pain Onset Other (comment)   6 mnths ago without precipitating event. No family hx   Pain Frequency Intermittent   w weight bearing activity   Aggravating Factors  "dont know"    Pain Relieving Factors elevationand relaxing, "don't know"    Effect of Pain on Daily Activities BLE swelling and associated pain contributes to difficulty w functional ambulation and mobility,  limits fitting lower body clothing and shoes,limits cooking,  limits instrumental ADLs requiring  standing and walking, including shopping,  home management, social participation, limits leisure pursuits, limits work -currently has long term disability from wor since back surgery. Unable to attend church    Multiple Pain Sites Yes    Pain Score 5    Pain Location Knee    Pain Orientation Right;Left    Pain Descriptors / Indicators Aching;Tingling;Tiring;Restless;Heaviness;Constant;Jabbing;Shooting;Cramping;Nagging;Sore;Numbness;Squeezing;Discomfort;Stabbing;Tender;Pins and needles;Dull;Pounding;Grimacing;Throbbing;Tightness;Pressure;Guarding    Pain Type Chronic pain    Pain Onset Other (comment)   years   Pain Frequency Constant    Aggravating Factors  weight bearing, repositioning    Pain Relieving Factors dont know    Effect of Pain on Daily Activities limits functional ambulation and mobility, limits ability to perform basic and instrumental ADLs requiring standing, limits leisure pursuits, productive activities, and social participation. 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 Morbid Obesity is major contributer. Suspect Amlodipine    Referring Provider (OT) Crystal Arringtonm FNP    Onset Date/Surgical Date --   6 months ago-insideous   Prior Therapy no prior LE therapy. No compression      Precautions   Precautions Fall    Precaution Comments lifting restrictions      Balance Screen   Has the patient fallen in the past 6 months No    Has the patient had a decrease in activity level because of a fear of falling?  Yes    Is the patient reluctant to leave their home because of a fear of falling?  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    Leisure family, church, friends      IADL   Prior Level of Function Shopping I    Shopping Completely unable to shop    Prior Level of Function Light Housekeeping I    Light Housekeeping Performs light daily tasks but cannot maintain acceptable level of cleanliness    Prior Level of Function Meal Prep I    Meal Prep Able  to complete simple cold meal and snack prep    Prior Level of Function Community Mobility I    Training and development officer own vehicle      Mobility   Mobility Status Needs assist   transfers safely from wc and chair using armrests and extra time   Mobility Status Comments transport wc to clinic. walker at home. good safety awareness. ataxic gait      Vision - History   Baseline Vision Wears glasses all the time      Activity Tolerance   Activity Tolerance --   Pt feels it's reduced.   Sitting Balance --   WFL     Cognition   Overall Cognitive Status Within Functional Limits for tasks assessed      Observation/Other Assessments   Focus on Therapeutic Outcomes (FOTO)  Intake: 31/100      Posture/Postural Control   Posture/Postural Control Postural limitations    Postural Limitations Rounded Shoulders;Forward head;Increased lumbar lordosis      Sensation   Light Touch Impaired by gross assessment   hands and feet     Coordination   Gross Motor Movements are Fluid and Coordinated No      ROM / Strength   AROM / PROM / Strength Strength;AROM      AROM   Overall AROM  Deficits   BUE AROM WNL   Overall AROM Comments BLE AROM limited by OA pain in knees, and skin approximation 2/2 MO      Strength   Overall Strength Deficits    Strength Assessment Site Knee;Hip      Hand Function   Right Hand  Gross Grasp Functional    Left Hand Gross Grasp Functional            Severe Stage I, BLE Lymphedema 2/2 suspected CVI?  And Obesity with significant contributing progressive muscle weakness and immobility  Skin  Description Hyper-Keratosis Peau' de Orange Lichenification Lipodermatosclerosis Fibrotic/ Indurated Fatty Doughy spongy    x    x x   x   Skin dry Flaky WNL Macerated   mild      Color Redness Present Pallor Blanching Hemosiderin Staining Other       Varicosities bilaterally    Odor Malodorous Yeast Fungal infection  Absent      x   Temperature Warm Cool wnl    x     Pitting Edema   1+ 2+ 3+ 4+ Non-pitting           Girth Symmetrical Asymmetrical                   Distribution   x   toes to popliteal fossa     Stemmer Sign Positive Negative    x   Lymphorrhea History Of:  Present Absent     x    Wounds History Of Present Absent Venous Arterial Pressure Mechanical     x        Signs of Infection Redness Warmth Erythema Acute Swelling Drainage Borders                    Sensation Light Touch Deep pressure Hypersensitivty   Present Impaired Present Impaired Absent Impaired    x x  x     Nails WNL Fungus nail dystrophy       x Hair Growth Symmetrical Asymmetrical       Skin Creases Base of toes  Ankles   Base of Fingers Medial Thighs  Abdominal pannus Lobules  Face/neck   x x  x   x      LYMPHEDEMA/ONCOLOGY QUESTIONNAIRE - 02/15/22 0001       Lymphedema Assessments   Lymphedema Assessments Lower extremities                    OT Treatments/Exercises (OP) - 02/14/22 1508       Transfers   Transfers Sit to Stand;Stand to Sit    Sit to Stand 6: Modified independent (Device/Increase time);With armrests    Stand to Sit 6: Modified independent (Device/Increase time);With armrests      ADLs   Overall ADLs impaired in all occupational domains    LB Dressing difficulty fitting LB clothing and street shoes  due to leg swelling    Bathing unable to stand to shower, uses hand held shower.    Functional Mobility all transfers impaired by knee pain and havy swelling    Cooking unable to stand to cook 2/2 back and knee pain. orders food out    Home Maintenance unable    Driving locally    Work on company disability    Leisure family    ADL Education Given Yes      Manual Therapy   Manual Therapy Edema management                    OT Education - 02/14/22 1508     Education Details Provided Pt education regarding lymphatic structure and function, etiology, onset patterns and stages of progression. Taught interaction between blood circulatory system and lymphatic circulation.Discussed  impact of gravity and co-morbidities on lymphatic function. Outlined Complete Decongestive Therapy (CDT)  as standard of care and provided in depth information regarding 4 primary components of Intensive and Self Management Phases, including Manual Lymph Drainage (MLD), compression wrapping and garments, skin care, and therapeutic exercise. [name] Plate discussion with re need for frequent attendance and high burden of care when caregiver is needed, impact of co morbidities. We discussed  the chronic, progressive nature of lymphedema and Importance of daily, ongoing LE self-care essential for limiting progression and infection risk.    Person(s) Educated Patient    Methods Explanation;Demonstration;Handout    Comprehension Verbalized understanding;Returned demonstration;Need further instruction                 OT Long Term Goals - 02/15/22 1210       OT LONG TERM GOAL #1   Title Given this patients Intake score 31 /100 on the functional outcomes FOTO tool, patient will experience an increase in function of 5 points to improve basic and instrumental ADLs performance, including lymphedema self-care.    Baseline 31/100    Period Weeks    Status New    Target Date 05/16/22      OT LONG TERM GOAL #2    Title Pt will demonstrate understanding of lymphedema prevention strategies by identifying and discussing 5 precautions using printed reference (modified assistance) to reduce risk of progression and to limit infection risk.    Baseline Max A    Time 4    Period Days    Status New    Target Date --   4th OT Rx visit     OT LONG TERM GOAL #3   Title With Maximum caregiver assistance Pt will be able to apply multilayer, LUE compression wraps using gradient techniques to decrease limb volume, to limit infection risk, and to limit lymphedema progression. Pt unable to wrap  legs independently. Trained caregiver must assist during visit intervals for CDT to be effective.    Baseline dependent - Max caregiver assistance is necessary to complete Intensive Phase of Complete Decongestive Therapy and Lymphedema self-care home program  as Pt is unable to reach his feet to apply compression wraps and garments, to inspect skin, to groom nails to perform skin care and inspection and to perform simple self-MLD. Pt agrees to arrange for consistent caregiver prior to commencing CDT.    Time 4    Period Days    Status New    Target Date --   4th OT Rx visit     OT LONG TERM GOAL #4   Title With Maximum caregiver assistance between OT sessions Pt will achieve at least a 10% limb volume reduction below the knee bilaterally to return limb to more normal size and shape, to limit infection risk, to decrease pain, to improve function, and to limit lymphedema progression.    Baseline dependent    Time 12    Period Weeks    Status New    Target Date 05/16/22      OT LONG TERM GOAL #5   Title With caregiver assistance  Pt will achieve and sustain a least 85% compliance with all 4 LE self-care home program components throughout Intensive Phase CDT, including modified simple self-MLD, daily skin inspection and care, lymphatic pumping the ex, 23/7 compression wraps to optimize limb volume reductions, to limit lymphedema  progression and to limit further functional decline.    Baseline Dependent    Time 12    Period Weeks    Target Date 05/16/22                   Plan - 02/14/22 1516     Clinical Impression Statement Dejane ARLYSS CHRISP is a 61 year old female presenting with severe, stage 1- mild stage II, BLE lymphedema 2/2 suspected CVI and  obesity. Significant contributing factors include muscle weakness 2/2 MS and dependent positioning. Another possible exacerbating factors is a common side effect of Amlodipine, lower extremity and foot swelling. Pt reports she is currently taking Amlodipine, even though  this medication is not listed in the record. A common side effect of Norvasc is swelling of the lower extremities and feet, so a 1 month trial on an alternative HTN medication may result in reduced LE swelling. I recommend Pt discuss this with referring provider and not to alter her prescribed medications without her doctor's knowledge. Pt agrees with this plan before returning to OT to undergo Complete Decongestive Therapy. Pt may also benefit from a Dopler study to determine if venous disease is present.Pt will benefit from CDT to limit lymphedema progression. This condition is difficult to manage when muscle pumping loses effectiveness due to weakkness; however Pt may benefit from the use of an ADVANCEDl pneumatic compression device, or an advanced "pump"-Flexitouch, for long term self-management of lymphedema in the context of progressive MS. We can facilitate a trial during OT with this device to ensure access and appropriate. A basic "pump" is contraindicated in this case. Pt will benefit from OT for CDT to reduce leg swelling, to limit progression, to improve independence with basic and instrumental ADLs, leisure pursuits and productive activities and to improve social participation. Without CDT further functional decline 2/2 BLE chronic, progressive lymphedema is expected.    OT Occupational Profile  and History Detailed Assessment- Review of Records and additional review of physical, cognitive, psychosocial history related  to current functional performance    Occupational performance deficits (Please refer to evaluation for details): ADL's;IADL's;Rest and Sleep;Leisure;Social Participation;Work    Writer Several treatment options, min-mod task modification necessary    Comorbidities Affecting Occupational Performance: May have comorbidities impacting occupational performance    Modification or Assistance to Complete Evaluation  Min-Moderate modification of tasks or assist with assess necessary to complete eval    OT Frequency 2x / week    OT Duration 12 weeks    OT Treatment/Interventions Self-care/ADL training;DME and/or AE instruction;Manual lymph drainage;Compression bandaging;Therapeutic activities;Coping strategies training;Therapeutic exercise;Other (comment);Manual Therapy;Energy conservation;Patient/family education   skin care, Flexitouch trial, fit with appropriate compression garments that are comfortable, effective and that Pt is able to don and doff using assistive devices PRN   Recommended Other Services " Calcium channel blockers may not directly impair lymphatic function. However our results show that a reduced lymphatic function predisposes to CBC edema, which may explain why some patients develop edema during treatment." [employer], Niklas Telinius, Claiborne Billings, and Vibeke Hjortdal.  Reduced Lymphatic Function Predisposes to Calcium Channel Blocker Edema: A Randomized Placebo-Controlled Clinical Trial.  Lymphatic Research and Biology.Apr [email or web address]://[email or web address]/[email or web address]  Published in Volume: 18 Issue 2: April 16, 2019  Online Ahead of Print:August 18, 2018    Consulted and Agree with Plan of Care Patient             Patient will benefit from skilled therapeutic intervention in order to improve the  following deficits and impairments:           Visit Diagnosis: Lymphedema, not elsewhere classified    Problem List Patient Active Problem List   Diagnosis Date Noted   Postoperative seroma of subcutaneous tissue after non-dermatologic procedure 04/11/2021   Wound drainage 02/21/2021   Wound dehiscence 02/21/2021   Postoperative seroma of musculoskeletal structure after musculoskeletal procedure 12/05/2020   Spondylolisthesis of lumbar region 11/02/2020   Insulin resistance 02/15/2020   Class 3 severe obesity with serious comorbidity and body mass index (BMI) of 45.0 to 49.9 in adult ([place]) 01/19/2020   Vitamin D deficiency 01/18/2020   Other hyperlipidemia 02/22/2019   Primary osteoarthritis of both knees 10/14/2018   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

## 2022-02-18 NOTE — Addendum Note (Signed)
Addended by: Judithann Sauger on: 02/18/2022 11:29 AM   Modules accepted: Orders

## 2022-02-19 ENCOUNTER — Encounter: Payer: 59 | Admitting: Physician Assistant

## 2022-02-19 ENCOUNTER — Other Ambulatory Visit: Payer: Self-pay

## 2022-02-19 DIAGNOSIS — T8131XA Disruption of external operation (surgical) wound, not elsewhere classified, initial encounter: Secondary | ICD-10-CM | POA: Diagnosis not present

## 2022-02-19 NOTE — Progress Notes (Addendum)
LOCKLYN, HENRIQUEZ ([id number]) Visit Report for 02/19/2022 Arrival Information Details Patient Name: [name], [name] Date of Service: 02/19/2022 11:00 AM Medical Record Number: [id number] Patient Account Number: [id number] Date of Birth/Sex: July 08, 1961 (61 y.o. F) Treating RN: Yevonne Pax Primary Care Tniyah Nakagawa: Card, Jonny Ruiz Other Clinician: Referring Caesar Mannella: Card, John Treating Larene Ascencio/Extender: Rowan Blase in Treatment: 18 Visit Information History Since Last Visit All ordered tests and consults were completed: No Patient Arrived: [name] Added or deleted any medications: No Arrival Time: 10:59 Any new allergies or adverse reactions: No Accompanied By: self Had a fall or experienced change in No Transfer Assistance: None activities of daily living that may affect Patient Identification Verified: Yes risk of falls: Secondary Verification Process Completed: Yes Signs or symptoms of abuse/neglect since last visito No Patient Requires Transmission-Based Precautions: No Hospitalized since last visit: No Patient Has Alerts: No Implantable device outside of the clinic excluding No cellular tissue based products placed in the center since last visit: Has Dressing in Place as Prescribed: Yes Pain Present Now: No Electronic Signature(s) Signed: 02/20/2022 3:39:51 PM By: Yevonne Pax RN Entered By: Yevonne Pax on 02/19/2022 11:11:05 Barthelemy, Yocelin C. ([id number]) -------------------------------------------------------------------------------- Clinic Level of Care Assessment Details Patient Name: [name], [name] Date of Service: 02/19/2022 11:00 AM Medical Record Number: [id number] Patient Account Number: [id number] Date of Birth/Sex: [birth year] (61 y.o. F) Treating RN: Yevonne Pax Primary Care Reylynn Vanalstine: Card, Jonny Ruiz Other Clinician: Referring Cayce Quezada: Card, John Treating Kimyatta Lecy/Extender: Rowan Blase in Treatment: 18 Clinic Level of Care Assessment Items TOOL  4 Quantity Score []  - Use when only an EandM is performed on FOLLOW-UP visit 0 ASSESSMENTS - Nursing Assessment / Reassessment []  - Reassessment of Co-morbidities (includes updates in patient status) 0 []  - 0 Reassessment of Adherence to Treatment Plan ASSESSMENTS - Wound and Skin Assessment / Reassessment []  - Simple Wound Assessment / Reassessment - one wound 0 []  - 0 Complex Wound Assessment / Reassessment - multiple wounds []  - 0 Dermatologic / Skin Assessment (not related to wound area) ASSESSMENTS - Focused Assessment []  - Circumferential Edema Measurements - multi extremities 0 []  - 0 Nutritional Assessment / Counseling / Intervention []  - 0 Lower Extremity Assessment (monofilament, tuning fork, pulses) []  - 0 Peripheral Arterial Disease Assessment (using hand held doppler) ASSESSMENTS - Ostomy and/or Continence Assessment and Care []  - Incontinence Assessment and Management 0 []  - 0 Ostomy Care Assessment and Management (repouching, etc.) PROCESS - Coordination of Care []  - Simple Patient / Family Education for ongoing care 0 []  - 0 Complex (extensive) Patient / Family Education for ongoing care []  - 0 Staff obtains , Records, Test Results / Process Orders []  - 0 Staff telephones HHA, Nursing Homes / Clarify orders / etc []  - 0 Routine Transfer to another Facility (non-emergent condition) []  - 0 Routine Hospital Admission (non-emergent condition) []  - 0 New Admissions / / Ordering NPWT, Apligraf, etc. []  - 0 Emergency Hospital Admission (emergent condition) []  - 0 Simple Discharge Coordination []  - 0 Complex (extensive) Discharge Coordination PROCESS - Special Needs []  - Pediatric / Minor Patient Management 0 []  - 0 Isolation Patient Management []  - 0 Hearing / Language / Visual special needs []  - 0 Assessment of Community assistance (transportation, D/C planning, etc.) []  - 0 Additional assistance / Altered mentation []  -  0 Support Surface(s) Assessment (bed, cushion, seat, etc.) INTERVENTIONS - Wound Cleansing / Measurement Loa, Royale C. ( ) []  - 0 Simple Wound Cleansing - one wound []  -  0 Complex Wound Cleansing - multiple wounds  - 0 Wound Imaging (photographs - any number of wounds)  - 0 Wound Tracing (instead of photographs)  - 0 Simple Wound Measurement - one wound  - 0 Complex Wound Measurement - multiple wounds INTERVENTIONS - Wound Dressings  - Small Wound Dressing one or multiple wounds 0  - 0 Medium Wound Dressing one or multiple wounds  - 0 Large Wound Dressing one or multiple wounds  - 0 Application of Medications - topical  - 0 Application of Medications - injection INTERVENTIONS - Miscellaneous  - External ear exam 0  - 0 Specimen Collection (cultures, biopsies, blood, body fluids, etc.)  - 0 Specimen(s) / Culture(s) sent or taken to Lab for analysis  - 0 Patient Transfer (multiple staff / Nurse, adult / Similar devices)  - 0 Simple Staple / Suture removal (25 or less)  - 0 Complex Staple / Suture removal (26 or more)  - 0 Hypo / Hyperglycemic Management (close monitor of Blood Glucose)  - 0 Ankle / Brachial Index (ABI) - do not check if billed separately  - 0 Vital Signs Has the patient been seen at the hospital within the last three years: Yes Total Score: 0 Level Of Care: ____ Electronic Signature(s) Signed: 02/20/2022 3:39:51 PM By: Yevonne Pax RN Entered By: Yevonne Pax on 02/19/2022 11:46:28 Himmelberger, Corin CMarland Kitchen ([id number]) -------------------------------------------------------------------------------- Encounter Discharge Information Details Patient Name: [name], [name] Date of Service: 02/19/2022 11:00 AM Medical Record Number: [id number] Patient Account Number: [id number] Date of Birth/Sex: 1961-09-14 (61 y.o. F) Treating RN: Yevonne Pax Primary Care Berkley Wrightsman: [name] Other Clinician: Referring  Ramona Ruark: Card, John Treating Ariam Mol/Extender: Rowan Blase in Treatment: 18 Encounter Discharge Information Items Post Procedure Vitals Discharge Condition: Stable Temperature (F): 98.4 Ambulatory Status: Ambulatory Pulse (bpm): 78 Discharge Destination: Home Respiratory Rate (breaths/min): 18 Transportation: Private Auto Blood Pressure (mmHg): 148/78 Accompanied By: self Schedule Follow-up Appointment: Yes Clinical Summary of Care: Patient Declined Electronic Signature(s) Signed: 02/20/2022 3:39:51 PM By: Yevonne Pax RN Entered By: Yevonne Pax on 02/19/2022 12:01:29 Dower, Lorenzo C. ([id number]) -------------------------------------------------------------------------------- Lower Extremity Assessment Details Patient Name: [name], [name] Date of Service: 02/19/2022 11:00 AM Medical Record Number: [id number] Patient Account Number: [id number] Date of Birth/Sex: 1961-02-18 (61 y.o. F) Treating RN: Yevonne Pax Primary Care Lashina Milles: [name] Other Clinician: Referring Ugo Thoma: Card, John Treating Shakeitha Umbaugh/Extender: Rowan Blase in Treatment: 18 Electronic Signature(s) Signed: 02/20/2022 3:39:51 PM By: Yevonne Pax RN Entered By: Yevonne Pax on 02/19/2022 11:13:29 Letts, Malissia CMarland Kitchen ([id number]) -------------------------------------------------------------------------------- Multi Wound Chart Details Patient Name: [name], [name] Date of Service: 02/19/2022 11:00 AM Medical Record Number: [id number] Patient Account Number: [id number] Date of Birth/Sex: Aug 28, 1961 (61 y.o. F) Treating RN: Yevonne Pax Primary Care Morrell Fluke: [name] Other Clinician: Referring Lynkin Saini: Card, John Treating Shell Yandow/Extender: Rowan Blase in Treatment: 18 Vital Signs Height(in): Pulse(bpm): 78 Weight(lbs): 275 Blood Pressure(mmHg): 148/78 Body Mass Index(BMI): Temperature(F): 98.4 Respiratory Rate(breaths/min): 18 Photos: [N/A:N/A] Wound Location:  Distal, Midline Back N/A N/A Wounding Event: Surgical Injury N/A N/A Primary Etiology: Dehisced Wound N/A N/A Comorbid History: Hypertension N/A N/A Date Acquired: 04/11/2021 N/A N/A Weeks of Treatment: 18 N/A N/A Wound Status: Open N/A N/A Wound Recurrence: No N/A N/A Measurements L x W x D (cm) 0.2x0.2x3 N/A N/A Area (cm) : 0.031 N/A N/A Volume (cm) : 0.094 N/A N/A % Reduction in Area: 75.40% N/A N/A % Reduction in Volume: 83.70% N/A N/A Classification: Full Thickness Without Exposed N/A N/A Support Structures Exudate Amount:  Medium N/A N/A Exudate Type: Serous N/A N/A Exudate Color: amber N/A N/A Granulation Amount: Large (67-100%) N/A N/A Granulation Quality: Red N/A N/A Necrotic Amount: None Present (0%) N/A N/A Exposed Structures: Fat Layer (Subcutaneous Tissue): N/A N/A Yes Fascia: No Tendon: No Muscle: No Joint: No Bone: No Epithelialization: None N/A N/A Treatment Notes Electronic Signature(s) Signed: 02/20/2022 3:39:51 PM By: Yevonne Pax RN Entered By: Yevonne Pax on 02/19/2022 11:23:23 [name], [name] ([id number]) -------------------------------------------------------------------------------- Multi-Disciplinary Care Plan Details Patient Name: Zynda, Yolani C. Date of Service: 02/19/2022 11:00 AM Medical Record Number: [id number] Patient Account Number: [id number] Date of Birth/Sex: [birth year] (61 y.o. F) Treating RN: Yevonne Pax Primary Care Celeste Tavenner: [name] Other Clinician: Referring Triva Hueber: Card, John Treating Alize Acy/Extender: Rowan Blase in Treatment: 18 Active Inactive Wound/Skin Impairment Nursing Diagnoses: Knowledge deficit related to ulceration/compromised skin integrity Goals: Patient/caregiver will verbalize understanding of skin care regimen Date Initiated: 10/15/2021 Target Resolution Date: 02/15/2022 Goal Status: Active Ulcer/skin breakdown will have a volume reduction of 30% by week 4 Date Initiated: 10/15/2021 Date  Inactivated: 01/29/2022 Target Resolution Date: 12/15/2021 Goal Status: Unmet Unmet Reason: comorbities Ulcer/skin breakdown will have a volume reduction of 50% by week 8 Date Initiated: 10/15/2021 Date Inactivated: 01/29/2022 Target Resolution Date: 01/15/2022 Goal Status: Unmet Unmet Reason: comorbities Ulcer/skin breakdown will have a volume reduction of 80% by week 12 Date Initiated: 10/15/2021 Date Inactivated: 02/19/2022 Target Resolution Date: 02/15/2022 Goal Status: Unmet Unmet Reason: comorbities Ulcer/skin breakdown will heal within 14 weeks Date Initiated: 10/15/2021 Target Resolution Date: 03/15/2022 Goal Status: Active Interventions: Assess patient/caregiver ability to obtain necessary supplies Assess patient/caregiver ability to perform ulcer/skin care regimen upon admission and as needed Assess ulceration(s) every visit Notes: Electronic Signature(s) Signed: 02/20/2022 3:39:51 PM By: Yevonne Pax RN Entered By: Yevonne Pax on 02/19/2022 11:22:28 [name], [name] ([id number]) -------------------------------------------------------------------------------- Pain Assessment Details Patient Name: [name], [name] Date of Service: 02/19/2022 11:00 AM Medical Record Number: [id number] Patient Account Number: [id number] Date of Birth/Sex: Jun 15, 1961 (61 y.o. F) Treating RN: Yevonne Pax Primary Care Talar Fraley: [name] Other Clinician: Referring Leighanne Adolph: Card, John Treating Varonica Siharath/Extender: Rowan Blase in Treatment: 18 Active Problems Location of Pain Severity and Description of Pain Patient Has Paino No Site Locations Pain Management and Medication Current Pain Management: Electronic Signature(s) Signed: 02/20/2022 3:39:51 PM By: Yevonne Pax RN Entered By: Yevonne Pax on 02/19/2022 11:59:00 [name], [name] ([id number]) -------------------------------------------------------------------------------- Patient/Caregiver Education Details Patient Name:  [name], [name] Date of Service: 02/19/2022 11:00 AM Medical Record Number: [id number] Patient Account Number: [id number] Date of Birth/Gender: 01/29/1961 (61 y.o. F) Treating RN: Yevonne Pax Primary Care Physician: [name] Other Clinician: Referring Physician: Card, John Treating Physician/Extender: Rowan Blase in Treatment: 18 Education Assessment Education Provided To: Patient Education Topics Provided Wound/Skin Impairment: Methods: Explain/Verbal Responses: State content correctly Electronic Signature(s) Signed: 02/20/2022 3:39:51 PM By: Yevonne Pax RN Entered By: Yevonne Pax on 02/19/2022 11:46:58 [name], [name] ([id number]) -------------------------------------------------------------------------------- Wound Assessment Details Patient Name: [name], Darika C. Date of Service: 02/19/2022 11:00 AM Medical Record Number: [id number] Patient Account Number: [id number] Date of Birth/Sex: 11-30-1961 (61 y.o. F) Treating RN: Yevonne Pax Primary Care Dequan Kindred: [name] Other Clinician: Referring Carlisle Torgeson: Card, John Treating Trinidad Ingle/Extender: Rowan Blase in Treatment: 18 Wound Status Wound Number: 1 Primary Etiology: Dehisced Wound Wound Location: Distal, Midline Back Wound Status: Open Wounding Event: Surgical Injury Comorbid History: Hypertension Date Acquired: 04/11/2021 Weeks Of Treatment: 18 Clustered Wound: No Photos Wound Measurements Length: (cm) 0.2 Width: (cm) 0.2 Depth: (cm) 3  Area: (cm) 0.031 Volume: (cm) 0.094 % Reduction in Area: 75.4% % Reduction in Volume: 83.7% Epithelialization: None Tunneling: No Undermining: No Wound Description Classification: Full Thickness Without Exposed Support Structures Exudate Amount: Medium Exudate Type: Serous Exudate Color: amber Foul Odor After Cleansing: No Slough/Fibrino No Wound Bed Granulation Amount: Large (67-100%) Exposed Structure Granulation Quality: Red Fascia Exposed:  No Necrotic Amount: None Present (0%) Fat Layer (Subcutaneous Tissue) Exposed: Yes Tendon Exposed: No Muscle Exposed: No Joint Exposed: No Bone Exposed: No Treatment Notes Wound #1 (Back) Wound Laterality: Midline, Distal Cleanser Normal Saline Discharge Instruction: Wash your hands with soap and water. Remove old dressing, discard into plastic bag and place into trash. Cleanse the wound with Normal Saline prior to applying a clean dressing using gauze sponges, not tissues or cotton balls. Do not scrub or use excessive force. Pat dry using gauze sponges, not tissue or cotton balls. Mcmullan, Valeen C. ([id number]) Peri-Wound Care Topical Primary Dressing Hydrofera Blue Classic Foam Rope Dressing, 9x6 (mm/in) Discharge Instruction: cut rope in 1/2 lengthwise AND LEAVE A TAIL OUT TO GRAB IT Secondary Dressing Zetuvit Plus Silicone Border Dressing 4x4 (in/in) Secured With Compression Wrap Compression Stockings Add-Ons Electronic Signature(s) Signed: 02/20/2022 3:39:51 PM By: Yevonne Pax RN Entered By: Yevonne Pax on 02/19/2022 11:13:09 Plouff, Audery C. ([id number]) -------------------------------------------------------------------------------- Vitals Details Patient Name: Quant, Aribella C. Date of Service: 02/19/2022 11:00 AM Medical Record Number: [id number] Patient Account Number: [id number] Date of Birth/Sex: 1961/11/20 (61 y.o. F) Treating RN: Yevonne Pax Primary Care Javelle Donigan: [name] Other Clinician: Referring Shreyansh Tiffany: Card, John Treating Paulino Cork/Extender: Rowan Blase in Treatment: 18 Vital Signs Time Taken: 11:11 Temperature (F): 98.4 Weight (lbs): 275 Pulse (bpm): 78 Respiratory Rate (breaths/min): 18 Blood Pressure (mmHg): 148/78 Reference Range: 80 - 120 mg / dl Electronic Signature(s) Signed: 02/20/2022 3:39:51 PM By: Yevonne Pax RN Entered By: Yevonne Pax on 02/19/2022 11:56:32

## 2022-02-19 NOTE — Progress Notes (Addendum)
SERENITEE, HILLEGAS ([id number]) Visit Report for 02/19/2022 Chief Complaint Document Details Patient Name: [name], [name] Date of Service: 02/19/2022 11:00 AM Medical Record Number: [id number] Patient Account Number: [id number] Date of Birth/Sex: January 19, 1961 (61 y.o. F) Treating RN: Carlene Coria Primary Care Provider: Clayborn Heron Other Clinician: Referring Provider: Card, John Treating Provider/Extender: Skipper Cliche in Treatment: 18 Information Obtained from: Patient Chief Complaint Surgical Back Ulcer Electronic Signature(s) Signed: 02/19/2022 10:55:37 AM By: Worthy Keeler PA-C Entered By: Worthy Keeler on 02/19/2022 10:55:36 [name], [name] ([id number]) -------------------------------------------------------------------------------- Debridement Details Patient Name: [name], [name] Date of Service: 02/19/2022 11:00 AM Medical Record Number: [id number] Patient Account Number: [id number] Date of Birth/Sex: [birth year] (61 y.o. F) Treating RN: Carlene Coria Primary Care Provider: Clayborn Heron Other Clinician: Referring Provider: Card, John Treating Provider/Extender: Skipper Cliche in Treatment: 18 Debridement Performed for Wound #1 Distal,Midline Back Assessment: Performed By: Physician Tommie Sams., PA-C Debridement Type: Debridement Level of Consciousness (Pre- Awake and Alert procedure): Pre-procedure Verification/Time Out Yes - 11:40 Taken: Start Time: 11:40 Pain Control: Lidocaine Injectable : 1 Total Area Debrided (L x W): 0.5 (cm) x 0.5 (cm) = 0.25 (cm) Tissue and other material Viable, Non-Viable, Subcutaneous, Skin: Dermis , Skin: Epidermis debrided: Level: Skin/Subcutaneous Tissue Debridement Description: Excisional Instrument: Curette Bleeding: Moderate Hemostasis Achieved: Silver Nitrate End Time: 11:47 Procedural Pain: 4 Post Procedural Pain: 0 Response to Treatment: Procedure was tolerated well Level of Consciousness (Post- Awake  and Alert procedure): Post Debridement Measurements of Total Wound Length: (cm) 0.4 Width: (cm) 0.4 Depth: (cm) 3 Volume: (cm) 0.377 Character of Wound/Ulcer Post Debridement: Improved Post Procedure Diagnosis Same as Pre-procedure Electronic Signature(s) Signed: 02/19/2022 4:50:19 PM By: Worthy Keeler PA-C Signed: 02/20/2022 3:39:51 PM By: Carlene Coria RN Entered By: Carlene Coria on 02/19/2022 11:45:51 [name], [name] ([id number]) -------------------------------------------------------------------------------- HPI Details Patient Name: [name], [name] Date of Service: 02/19/2022 11:00 AM Medical Record Number: [id number] Patient Account Number: [id number] Date of Birth/Sex: 23-Nov-1961 (61 y.o. F) Treating RN: Carlene Coria Primary Care Provider: Clayborn Heron Other Clinician: Referring Provider: Card, John Treating Provider/Extender: Skipper Cliche in Treatment: 18 History of Present Illness HPI Description: 10/15/2021 upon evaluation today patient presents for initial evaluation here in the clinic concerning a surgical ulceration/dehiscence in the lumbar spine region following surgery that she had over the past year. This was actually broken up into 3 separate surgical events. The initial surgical intervention actually was on November 05, 2021 almost a year ago. Subsequently the patient went back in February for a seroma of the area which unfortunately required her to have a repeat surgery to go in and clean this out. And then again this occurred in April where she went back in and again they felt like stitches were coming out and there was an additional seroma. She was placed in a wound VAC initially and then subsequently as it got smaller that was discontinued. Again right now I will see anything that I think a wound VAC would help with. Nonetheless she definitely has a significant depth to the wound that is going require packing. I actually believe the Hydrofera Blue rope  would probably do quite well with this the problem is as much as it is draining she probably needs this to be changed at least every day. She does not really have anyone that can help with that that is the complicating scenario here. With that being said the patient does have a history of multiple sclerosis, hypertension, and again  this surgical wound dehiscence in regard to her lumbar spine region. She did have a repeat MRI which was actually completed 10/09/2021. This showed that there was no significant change in the subcutaneous fluid collection/track of the lower lumbar region. This is extending to the level of the fascia unfortunately. This seems to go all the way from the L2 level with a track extending all the way to the fascia at the L4-5 level. Again this is a significant wound and there is significant drainage but does not seem to communicate to the spinal region as far as spinal fluid or otherwise is concerned that is good news. Nonetheless she last saw Dr. Christella Noa who is her neurosurgeon on 10/01/2021 that was when he ordered this last MRI she supposed to see him next week as well. With that being said he did not feel like there was any significant issue there but was not sure why this was not healing that is when he ordered the MRI. They were wanting to make sure that this was packed appropriately by home health unfortunately the main issue currently is that home health is completely out of the picture as the patient has exhausted all the home health that that she gets for a year. She is now in a very difficult predicament where she does not have anyone to help her change the dressing and to be honest that she is not able to do it herself with the location of the wound being on the midline lumbar spine region. If she does not have anyone that can help it is probably can to be necessary for her to go to a facility for rehab and daily dressing changes as I feel like daily changes which is  much drainage that she is having is going to be necessary. 10/23/2021 upon evaluation today patient appears to be doing decently well in regard to her back ulcer. This does seem to be draining a lot less than what it is been doing in the past. With that being said she still has quite a bit of drainage nonetheless. I do think that given time this should improve least I hope so. The good news is she does have home health coming out 3 days a week were doing it 2 days a week and she is paying someone we can to help. 10/30/2021 upon evaluation today patient appears to be doing okay in regard to her back ulcer this is not draining quite as bad as it was in the beginning but he still has quite a bit of drainage noted. I do believe that the patient would benefit from [place] going ahead forward with attempting a wound VAC using the Hydrofera Blue rope to pack with and then subsequently using the VAC externally to actually suction out and help this to fill- in. I think this is our ideal way to try to get things cleared at this point. As it stands I am not certain that we are really making a progress that we want to see near with doing it in the way we are which is packing with the rope. It is a good dressing but I do think it is insufficient for total healing. She just seems to have too much in the way of drainage at this point unfortunately. 11/06/2021 upon evaluation today patient unfortunately continues to have issues with her back ongoing. The good news is her MRI that was repeated showed signs of the size of this area in the lumbar spine region  having decreased from 4 cm to 3.5 cm this is definitely not bad news at all. With that being said unfortunately she continues to have issues with ongoing drainage not as severe as in the beginning but nonetheless still significant. I do think a wound VAC still would be a good way to go although her home health agency nurse apparently has some concerns about the  possibility of not being able to keep a seal with this as they had struggles in the past. Nonetheless I explained to the patient that this is much different than what she had previous and that I really feel like it would do much better as far as getting the area taken care of without having any complications or issues here. I think that we should be able to maintain a seal. Nonetheless at this time I did discuss with the patient as well that she probably does need to have a wound VAC in order for [place] to get this moving in the right direction. 11/13/2021 upon evaluation patient's wound bed actually showed signs of significant drainage at this time. She did see the surgeon yesterday he did not see anything that appeared to be infected. Nonetheless he does appear that she is continuing to have areas here that just do not seem to want to seal up there MRI findings have been negative but nonetheless she continues to have is the seroma that is filling in. I do feel like we need to try to widen the hole so we can get at least a half of the [facility] then this will be better than nothing at this point. 11/20/2021 upon evaluation today patient actually appears to be having less pain at this point which is good news and overall she we still do not have the results of the culture back yet it had to be sent out to [facility] and we do not have the result back yet. Is doing decently well in regard to her wound. Fortunately there does not appear to be any signs of active infection systemically nor locally at this time. 11/27/2021 upon evaluation today patient appears to be doing well with regard to her wound all things considered there does appear to be less drainage than there was previous. Fortunately I do not see any evidence of worsening of the patient is stating that she is having some issues with back pain. This is somewhat new. Again this I think could be related to the fact that she is having some issues here  with infection. We are still waiting to see what the result of her culture shows from susceptibility testing Enterococcus has been identified but we do not know if this is VRE or not. 12/04/2021 upon evaluation today patient appears to be doing well with regard to her wound all things considered. I did have a conversation with Erin from Dr. Lacy Duverney office. Of note she notes that Dr. Joetta Manners really does not want to do anything surgical right now which I completely understand. With that being said I am still leery of how far we will make it getting this to heal short of any type of surgery to open this up and allow [place] to more appropriately packed the wound. Nonetheless I will absolutely give it our best shot as far as that is concerned. I discussed that with the patient today. She voiced understanding. The good news is the drainage today seems to be clear it is no longer cloudy as it was previous I am actually  very pleased in that regard. [name], [name] ([id number]) 12/11/2021 since have last seen the patient actually did have an conversation with Dr. Christella Noa with her neurosurgeon. Again this involve the discussion around whether or not to open the wound and try to apply a wound VAC following. With that being said the decision was made that that may be the best thing to do if the patient was in agreement. Nonetheless I am actually extremely encouraged with what I am seeing today much more than I would have thought. In fact I think that we may be over packing the wound which is why she is having some discomfort at this point as there is much less of the dressing able to get and this time compared to what we saw previous. That is actually really good news. In fact the 6:00 tunnel appears to have filled in is awesome news. At the 12:00 location I am actually able to pack into that area pretty effectively at this point today. In fact I was able to get less of the packing in which I will detail  below. 12/18/2021 upon evaluation today patient appears to be doing well with regard to her wound on the back. Fortunately there is no signs of active infection which is great news and overall very pleased with where things stand today. No fevers, chills, nausea, vomiting, or diarrhea. 01/01/2022 upon evaluation today patient appears to be doing decently well in regard to her wound. Fortunately there does not appear to be any signs of anything worsening which is great news. She still continues to have issues with drainage but this is not nearly as significant as what it was in the past. There is all clear drainage no signs of purulence noted. 01/08/2022 upon evaluation today patient appears to be doing well with regard to her wound. With that being said she tells me that she has been having some increased pain over the past week. It does appear based on the amount of Hydrofera Blue that we removed this is probably over pack. Again it is difficult to know exactly how old the nurses getting all the skin but nonetheless the length of Hydrofera Blue that was utilized was way too much which may be part of the reason why this felt so uncomfortable. Fortunately I think that we can cut back on that we discussed pieces smaller so hopefully that will not be over packed. 01/22/2021 upon evaluation today patient appears to be doing well With regard to there being no signs of infection at this time. The wound does still tunnel mainly up at the 12:00 location. The depth of the tunnel complete from entry point to the base is about 3.5 cm today. With that being said I do believe that overall she is making good progress this is just a very slow process for her which I know has been frustrating as well. 01/29/2022 upon evaluation today patient appears to be doing well with regard to the wound on her lower back and the lumbar spine region. The good news is the depth that I got this week was a little bit less going up at the  12:00 location as compared to last week. I measured this to be 3.5 cm last week and it was 3.3 cm this week. Again that is a minute shift but nonetheless a good shift in the right direction. Overall I think that we are on the right track. 02/05/2022 upon evaluation today patient appears to be doing well with regard  to her wound. In fact right now her measurements are somewhere around 2.9 cm which is definitely smaller and less deep than last week At the 12:00 location. Overall I am very pleased with what we are seeing. 02/19/2022 upon evaluation today patient unfortunately is noting that the wound is actually closing up externally but internally there is still some depth to this. I discussed with her today that we cannot let it close up like this is can end up being a significant issue for her if we allow for that. Subsequently we need to do what we can to try to open this up and keep it open more effectively. She voiced understanding. With that being said we are going to proceed with that procedure today in order to debride away some of the skin on externally that is trying to close and on this. Electronic Signature(s) Signed: 02/19/2022 4:37:12 PM By: Worthy Keeler PA-C Entered By: Worthy Keeler on 02/19/2022 16:37:12 [name], [name] ([id number]) -------------------------------------------------------------------------------- Physical Exam Details Patient Name: [name], [name] Date of Service: 02/19/2022 11:00 AM Medical Record Number: [id number] Patient Account Number: [id number] Date of Birth/Sex: 1961-06-06 (61 y.o. F) Treating RN: Carlene Coria Primary Care Provider: Clayborn Heron Other Clinician: Referring Provider: Card, John Treating Provider/Extender: Skipper Cliche in Treatment: 34 Constitutional Well-nourished and well-hydrated in no acute distress. Respiratory normal breathing without difficulty. Psychiatric this patient is able to make decisions and demonstrates good  insight into disease process. Alert and Oriented x 3. pleasant and cooperative. Notes Upon inspection patient's wound again was very tiny but still had significant depth. This is can end up being a very complicating factor for her if we do not get this open. For that reason I actually utilized a 5 mm punch biopsy in order to make an opening for which we can pack in this wound with Hydrofera Blue rope. This actually provided an excellent and very symmetric opening over the location where the original hole was closing in and I was able to use silver nitrate to cauterize the edges as well. This was after numbing her with 2% lidocaine and the amount of about 3-1/2 cc. She had no pain during the procedure which was excellent news. I did not put in the dressing post debridement and was able to do so effectively and cleanly. Electronic Signature(s) Signed: 02/19/2022 4:38:08 PM By: Worthy Keeler PA-C Entered By: Worthy Keeler on 02/19/2022 16:38:08 [name], [name] ([id number]) -------------------------------------------------------------------------------- Physician Orders Details Patient Name: [name], [name] Date of Service: 02/19/2022 11:00 AM Medical Record Number: [id number] Patient Account Number: [id number] Date of Birth/Sex: Jan 25, 1961 (61 y.o. 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 o Nurse Visit as needed - nurse visit on tuesday and [place]: -  [facility] for wound care. May utilize formulary equivalent dressing for wound treatment orders unless otherwise specified. Home Health Nurse may visit [facility] to address patientos wound care needs. - 3 times per week [facility] fax [phone] Bathing/ Shower/ Hygiene o May shower; gently cleanse wound with antibacterial soap, rinse and pat dry prior  to dressing wounds o No tub bath. Anesthetic (Use 'Patient Medications' Section for Anesthetic Order Entry) o Lidocaine applied to wound bed Edema Control - Lymphedema / Segmental Compressive Device / Other o Elevate, Exercise Daily and Avoid Standing for Long Periods of Time. o Elevate legs to the level of the heart and pump ankles as often as possible o Elevate leg(s) parallel to the floor when sitting. Additional Orders / Instructions o Follow Nutritious Diet and Increase Protein Intake Wound Treatment Wound #1 - Back Wound Laterality: Midline, Distal Cleanser: Normal Saline 1 x Per Day/30 Days Discharge Instructions: Wash your hands with soap and water. Remove old dressing, discard into plastic bag and place into trash. Cleanse the wound with Normal Saline prior to applying a clean dressing using gauze sponges, not tissues or cotton balls. Do not scrub or use excessive force. Pat dry using gauze sponges, not tissue or cotton balls. Primary Dressing: Hydrofera Blue Classic Foam Rope Dressing, 9x6 (mm/in) 1 x Per Day/30 Days Discharge Instructions: cut rope in 1/2 lengthwise AND LEAVE A TAIL OUT TO GRAB IT Secondary Dressing: Zetuvit Plus Silicone Border Dressing 4x4 (in/in) 1 x Per Day/30 Days Patient Medications Allergies: No Known Allergies Notifications Medication Indication Start End Augmentin 02/21/2022 DOSE 1 - oral 875 mg-125 mg tablet - 1 tablet oral taken 2 times per day for 14 days LENICE, SCHMUHL ([id number]) Electronic Signature(s) Signed: 02/21/2022 3:18:35 PM By: Worthy Keeler PA-C Previous  Signature: 02/19/2022 4:50:19 PM Version By: Worthy Keeler PA-C Previous Signature: 02/20/2022 3:39:51 PM Version By: Carlene Coria RN Entered By: Worthy Keeler on 02/21/2022 15:18:34 [name], [name] ([id number]) -------------------------------------------------------------------------------- Problem List Details Patient Name: [name], [name] Date of Service: 02/19/2022 11:00 AM Medical Record Number: [id number] Patient Account Number: [id number] Date of Birth/Sex: Apr 23, 1961 (61 y.o. 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 ([id number]) -------------------------------------------------------------------------------- Progress Note Details Patient Name: Munro, Betina C. Date of Service: 02/19/2022 11:00 AM Medical Record Number: [id number] Patient Account Number: [id number] Date of Birth/Sex: [birth year] (61 y.o. F) Treating RN: Carlene Coria Primary Care Provider: Clayborn Heron Other Clinician: Referring Provider: Card, John Treating Provider/Extender: Skipper Cliche in Treatment: 18 Subjective Chief Complaint Information obtained from Patient Surgical Back Ulcer History of Present Illness (HPI) 10/15/2021 upon evaluation today patient presents for initial evaluation here in the  clinic concerning a surgical ulceration/dehiscence in the lumbar spine region following surgery that she had over the past year. This was actually broken up into 3 separate surgical events. The initial surgical intervention actually was on November 05, 2021 almost a year ago. Subsequently the patient went back in February for a seroma of the area which unfortunately required her to have a repeat surgery to go in and clean this out. And then again this occurred in April where she went back in and again they felt like stitches were coming out and there was an additional seroma. She was placed in a wound VAC  initially and then subsequently as it got smaller that was discontinued. Again right now I will see anything that I think a wound VAC would help with. Nonetheless she definitely has a significant depth to the wound that is going require packing. I actually believe the Hydrofera Blue rope would probably do quite well with this the problem is as much as it is draining she probably needs this to be changed at least every day. She does not really have anyone that can help with that that is the complicating scenario here. With that being said the patient does have a history of multiple sclerosis, hypertension, and again this surgical wound dehiscence in regard to her lumbar spine region. She did have a repeat MRI which was actually completed 10/09/2021. This showed that there was no significant change in the subcutaneous fluid collection/track of the lower lumbar region. This is extending to the level of the fascia unfortunately. This seems to go all the way from the L2 level with a track extending all the way to the fascia at the L4-5 level. Again this is a significant wound and there is significant drainage but does not seem to communicate to the spinal region as far as spinal fluid or otherwise is concerned that is good news. Nonetheless she last saw Dr. Christella Noa who is her neurosurgeon on 10/01/2021 that  was when he ordered this last MRI she supposed to see him next week as well. With that being said he did not feel like there was any significant issue there but was not sure why this was not healing that is when he ordered the MRI. They were wanting to make sure that this was packed appropriately by home health unfortunately the main issue currently is that home health is completely out of the picture as the patient has exhausted all the home health that that she gets for a year. She is now in a very difficult predicament where she does not have anyone to help her change the dressing and to be honest that she is not able to do it herself with the location of the wound being on the midline lumbar spine region. If she does not have anyone that can help it is probably can to be necessary for her to go to a facility for rehab and daily dressing changes as I feel like daily changes which is much drainage that she is having is going to be necessary. 10/23/2021 upon evaluation today patient appears to be doing decently well in regard to her back ulcer. This does seem to be draining a lot less than what it is been doing in the past. With that being said she still has quite a bit of drainage nonetheless. I do think that given time this should improve least I hope so. The good news is she does have home health coming out 3 days a week were doing it 2 days a week and she is paying someone we can to help. 10/30/2021 upon evaluation today patient appears to be doing okay in regard to her back ulcer this is not draining quite as bad as it was in the beginning but he still has quite a bit of drainage noted. I do believe that the patient would benefit from [place] going ahead forward with attempting a wound VAC using the Hydrofera Blue rope to pack with and then subsequently using the VAC externally to actually suction out and help this to fill- in. I think this is our ideal way to  try to get things cleared at this point.  As it stands I am not certain that we are really making a progress that we want to see near with doing it in the way we are which is packing with the rope. It is a good dressing but I do think it is insufficient for total healing. She just seems to have too much in the way of drainage at this point unfortunately. 11/06/2021 upon evaluation today patient unfortunately continues to have issues with her back ongoing. The good news is her MRI that was repeated showed signs of the size of this area in the lumbar spine region having decreased from 4 cm to 3.5 cm this is definitely not bad news at all. With that being said unfortunately she continues to have issues with ongoing drainage not as severe as in the beginning but nonetheless still significant. I do think a wound VAC still would be a good way to go although her home health agency nurse apparently has some concerns about the possibility of not being able to keep a seal with this as they had struggles in the past. Nonetheless I explained to the patient that this is much different than what she had previous and that I really feel like it would do much better as far as getting the area taken care of without having any complications or issues here. I think that we should be able to maintain a seal. Nonetheless at this time I did discuss with the patient as well that she probably does need to have a wound VAC in order for [place] to get this moving in the right direction. 11/13/2021 upon evaluation patient's wound bed actually showed signs of significant drainage at this time. She did see the surgeon yesterday he did not see anything that appeared to be infected. Nonetheless he does appear that she is continuing to have areas here that just do not seem to want to seal up there MRI findings have been negative but nonetheless she continues to have is the seroma that is filling in. I do feel like we need to try to widen the hole so we can get at least a half of  the [facility] then this will be better than nothing at this point. 11/20/2021 upon evaluation today patient actually appears to be having less pain at this point which is good news and overall she we still do not have the results of the culture back yet it had to be sent out to [facility] and we do not have the result back yet. Is doing decently well in regard to her wound. Fortunately there does not appear to be any signs of active infection systemically nor locally at this time. 11/27/2021 upon evaluation today patient appears to be doing well with regard to her wound all things considered there does appear to be less drainage than there was previous. Fortunately I do not see any evidence of worsening of the patient is stating that she is having some issues with back pain. This is somewhat new. Again this I think could be related to the fact that she is having some issues here with infection. We are still waiting to see what the result of her culture shows from susceptibility testing Enterococcus has been identified but we do not know if this is VRE or not. 12/04/2021 upon evaluation today patient appears to be doing well with regard to her wound all things considered. I did have a conversation with Erin from Dr.  Cabbell's office. Of note she notes that Dr. Joetta Manners really does not want to do anything surgical right now which I completely Fusco, Britini C. ([id number]) understand. With that being said I am still leery of how far we will make it getting this to heal short of any type of surgery to open this up and allow [place] to more appropriately packed the wound. Nonetheless I will absolutely give it our best shot as far as that is concerned. I discussed that with the patient today. She voiced understanding. The good news is the drainage today seems to be clear it is no longer cloudy as it was previous I am actually very pleased in that regard. 12/11/2021 since have last seen the patient actually did  have an conversation with Dr. Christella Noa with her neurosurgeon. Again this involve the discussion around whether or not to open the wound and try to apply a wound VAC following. With that being said the decision was made that that may be the best thing to do if the patient was in agreement. Nonetheless I am actually extremely encouraged with what I am seeing today much more than I would have thought. In fact I think that we may be over packing the wound which is why she is having some discomfort at this point as there is much less of the dressing able to get and this time compared to what we saw previous. That is actually really good news. In fact the 6:00 tunnel appears to have filled in is awesome news. At the 12:00 location I am actually able to pack into that area pretty effectively at this point today. In fact I was able to get less of the packing in which I will detail below. 12/18/2021 upon evaluation today patient appears to be doing well with regard to her wound on the back. Fortunately there is no signs of active infection which is great news and overall very pleased with where things stand today. No fevers, chills, nausea, vomiting, or diarrhea. 01/01/2022 upon evaluation today patient appears to be doing decently well in regard to her wound. Fortunately there does not appear to be any signs of anything worsening which is great news. She still continues to have issues with drainage but this is not nearly as significant as what it was in the past. There is all clear drainage no signs of purulence noted. 01/08/2022 upon evaluation today patient appears to be doing well with regard to her wound. With that being said she tells me that she has been having some increased pain over the past week. It does appear based on the amount of Hydrofera Blue that we removed this is probably over pack. Again it is difficult to know exactly how old the nurses getting all the skin but nonetheless the length of  Hydrofera Blue that was utilized was way too much which may be part of the reason why this felt so uncomfortable. Fortunately I think that we can cut back on that we discussed pieces smaller so hopefully that will not be over packed. 01/22/2021 upon evaluation today patient appears to be doing well With regard to there being no signs of infection at this time. The wound does still tunnel mainly up at the 12:00 location. The depth of the tunnel complete from entry point to the base is about 3.5 cm today. With that being said I do believe that overall she is making good progress this is just a very slow process for her which I know  has been frustrating as well. 01/29/2022 upon evaluation today patient appears to be doing well with regard to the wound on her lower back and the lumbar spine region. The good news is the depth that I got this week was a little bit less going up at the 12:00 location as compared to last week. I measured this to be 3.5 cm last week and it was 3.3 cm this week. Again that is a minute shift but nonetheless a good shift in the right direction. Overall I think that we are on the right track. 02/05/2022 upon evaluation today patient appears to be doing well with regard to her wound. In fact right now her measurements are somewhere around 2.9 cm which is definitely smaller and less deep than last week At the 12:00 location. Overall I am very pleased with what we are seeing. 02/19/2022 upon evaluation today patient unfortunately is noting that the wound is actually closing up externally but internally there is still some depth to this. I discussed with her today that we cannot let it close up like this is can end up being a significant issue for her if we allow for that. Subsequently we need to do what we can to try to open this up and keep it open more effectively. She voiced understanding. With that being said we are going to proceed with that procedure today in order to debride away  some of the skin on externally that is trying to close and on this. Objective Constitutional Well-nourished and well-hydrated in no acute distress. Vitals Time Taken: 11:11 AM, Weight: 275 lbs, Temperature: 98.4 F, Pulse: 78 bpm, Respiratory Rate: 18 breaths/min, Blood Pressure: 148/78 mmHg. Respiratory normal breathing without difficulty. Psychiatric this patient is able to make decisions and demonstrates good insight into disease process. Alert and Oriented x 3. pleasant and cooperative. General Notes: Upon inspection patient's wound again was very tiny but still had significant depth. This is can end up being a very complicating factor for her if we do not get this open. For that reason I actually utilized a 5 mm punch biopsy in order to make an opening for which we can pack in this wound with Hydrofera Blue rope. This actually provided an excellent and very symmetric opening over the location where the original hole was closing in and I was able to use silver nitrate to cauterize the edges as well. This was after numbing her with 2% lidocaine and the amount of about 3-1/2 cc. She had no pain during the procedure which was excellent news. I did not put in the dressing post debridement and was able to do so effectively and cleanly. Integumentary (Hair, Skin) Wound #1 status is Open. Original cause of wound was Surgical Injury. The date acquired was: 04/11/2021. The wound has been in treatment 18 weeks. The wound is located on the [place] Back. The wound measures 0.2cm length x 0.2cm width x 3cm depth; 0.031cm^2 area and 0.094cm^3 volume. There is Fat Layer (Subcutaneous Tissue) exposed. There is no tunneling or undermining noted. There is a medium amount Smelser, Kyisha C. ([id number]) of serous drainage noted. There is large (67-100%) red granulation within the wound bed. There is no necrotic tissue within the wound bed. 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 There was a Excisional Skin/Subcutaneous Tissue Debridement with a total area of 0.25 sq cm performed by Tommie Sams., PA-C. With the following instrument(s): Curette to remove Viable and Non-Viable tissue/material. Material removed includes Subcutaneous Tissue, Skin: Dermis, and Skin: Epidermis after achieving pain control using Lidocaine Injectable: 1%. No specimens were taken. A time out was conducted at 11:40, prior to the start of the procedure. A Moderate amount of bleeding was controlled with Silver Nitrate. The procedure was tolerated well with a pain level of 4 throughout and a pain level of 0 following the procedure. Post Debridement Measurements: 0.4cm length x 0.4cm width x 3cm depth; 0.377cm^3 volume. Character of Wound/Ulcer Post Debridement is improved. Post procedure Diagnosis Wound #1: Same as Pre-Procedure Plan Follow-up Appointments: Return Appointment in 1 week. Nurse Visit as needed - nurse visit on tuesday and [place]: [place]: - [place] for wound care. May utilize formulary equivalent dressing for wound treatment orders unless otherwise specified. Home Health Nurse may visit [facility] to address patient s wound care needs. - 3 times per week [facility] fax [phone] Bathing/ Shower/ Hygiene: May shower; gently cleanse wound with antibacterial soap, rinse and pat dry prior to dressing wounds No tub bath. Anesthetic (Use 'Patient Medications' Section for Anesthetic Order Entry): Lidocaine applied to wound bed Edema Control - Lymphedema / Segmental Compressive Device / Other: Elevate, Exercise Daily and Avoid Standing for Long Periods of  Time. Elevate legs to the level of the heart and pump ankles as often as possible Elevate leg(s) parallel to the floor when sitting. Additional Orders / Instructions: Follow Nutritious Diet and Increase Protein Intake The following medication(s) was prescribed: Augmentin oral 875 mg-125 mg tablet 1 1 tablet oral taken 2 times per day for 14 days starting 02/21/2022 WOUND #1: - Back Wound Laterality: Midline, Distal Cleanser: Normal Saline 1 x Per Day/30 Days Discharge Instructions: Wash your hands with soap and water. Remove old dressing, discard into plastic bag and place into trash. Cleanse the wound with Normal Saline prior to applying a clean dressing using gauze sponges, not tissues or cotton balls. Do not scrub or use excessive force. Pat dry using gauze sponges, not tissue or cotton balls. Primary Dressing: Hydrofera Blue Classic Foam Rope Dressing, 9x6 (mm/in) 1 x Per Day/30 Days Discharge Instructions: cut rope in 1/2 lengthwise AND LEAVE A TAIL OUT TO GRAB IT Secondary Dressing: Zetuvit Plus Silicone Border Dressing 4x4 (in/in) 1 x Per Day/30 Days 1. Would recommend currently that we going continue with the wound care measures as before and the patient is in agreement with plan this includes the use of the [facility] dressing which I think is still good to be the ideal thing to do. Borgmeyer, Dahlila C. ([id number]) 2. Also can recommend that we continue with the Zetuvit bordered foam dressing to cover. Right now we will put in the [facility] dressing cut in half lengthwise. We will see patient back for reevaluation in 1 week here in the clinic. If anything worsens or changes patient will contact our office for additional recommendations. I did discuss with her that she is probably to have some pain following this procedure. For that reason I suggested that she actually take some ibuprofen as well along with the hydrocodone that she has to get on top of the pain before it gets  too bad. Electronic Signature(s) Signed: 02/21/2022 3:21:15 PM By: Worthy Keeler PA-C Previous Signature: 02/19/2022 4:38:49 PM Version By:  Melburn Hake, Kendricks Reap PA-C Entered By: Worthy Keeler on 02/21/2022 15:21:15 Geurts, [name] ([id number]) -------------------------------------------------------------------------------- SuperBill Details Patient Name: Zogg, Daphna C. Date of Service: 02/19/2022 Medical Record Number: [id number] Patient Account Number: [id number] Date of Birth/Sex: 05/31/1961 (61 y.o. 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 G35 Multiple sclerosis I10 Essential (primary) hypertension Facility Procedures CPT4 Code: [id number] Description: [id number] - DEB SUBQ TISSUE 20 SQ CM/< Modifier: Quantity: 1 CPT4 Code: Description: ICD-10 Diagnosis Description T81.31XA Disruption of external operation (surgical) wound, not elsewhere classifie Modifier: d, initial encounter Quantity: Physician Procedures CPT4 [id number] Description: 11042 - WC PHYS SUBQ TISS 20 SQ CM Modifier: Quantity: 1 CPT4 Code: Description: ICD-10 Diagnosis Description T81.31XA Disruption of external operation (surgical) wound, not elsewhere classifie Modifier: d, initial encounter Quantity: Electronic Signature(s) Signed: 02/19/2022 4:41:15 PM By: Worthy Keeler PA-C Previous Signature: 02/19/2022 4:13:43 PM Version By: Carlene Coria RN Entered By: Worthy Keeler on 02/19/2022 16:41:14

## 2022-02-20 ENCOUNTER — Encounter: Payer: 59 | Admitting: Occupational Therapy

## 2022-02-21 ENCOUNTER — Other Ambulatory Visit: Payer: Self-pay

## 2022-02-21 DIAGNOSIS — T8131XA Disruption of external operation (surgical) wound, not elsewhere classified, initial encounter: Secondary | ICD-10-CM | POA: Diagnosis not present

## 2022-02-21 NOTE — Progress Notes (Signed)
ADISYNN, SULEIMAN ([id number]) Visit Report for 02/21/2022 Arrival Information Details Patient Name: [name], [name] Date of Service: 02/21/2022 3:30 PM Medical Record Number: [id number] Patient Account Number: [id number] Date of Birth/Sex: 06-Oct-1961 (61 y.o. F) Treating RN: [name] Primary Care [name]: [name], [name] Ruiz Other Clinician: Referring [name]: [name], [name] Treating [name]/Extender: [name] in Treatment: 18 Visit Information History Since Last Visit All ordered tests and consults were completed: No Patient Arrived: [name] Added or deleted any medications: No Arrival Time: 15:00 Any new allergies or adverse reactions: No Accompanied By: self Had a fall or experienced change in No Transfer Assistance: None activities of daily living that may affect Patient Identification Verified: Yes risk of falls: Secondary Verification Process Completed: Yes Signs or symptoms of abuse/neglect since last visito No Patient Requires Transmission-Based Precautions: No Hospitalized since last visit: No Patient Has Alerts: No Implantable device outside of the clinic excluding No cellular tissue based products placed in the center since last visit: Has Dressing in Place as Prescribed: Yes Pain Present Now: No Electronic Signature(s) Signed: 02/21/2022 3:36:30 PM By: [name] Pax RN Entered By: [name] on 02/21/2022 15:36:29 [name], [name] ([id number]) -------------------------------------------------------------------------------- Clinic Level of Care Assessment Details Patient Name: [name], [name] Date of Service: 02/21/2022 3:30 PM Medical Record Number: [id number] Patient Account Number: [id number] Date of Birth/Sex: 07/12/1961 (61 y.o. F) Treating RN: [name] Primary Care Topher [place]: [name], [name] Ruiz Other Clinician: Referring [name]: [name], [name] Treating Shantele Reller/Extender: [name] in Treatment: 18 Clinic Level of Care Assessment Items TOOL 4  Quantity Score X - Use when only an EandM is performed on FOLLOW-UP visit 1 0 ASSESSMENTS - Nursing Assessment / Reassessment X - Reassessment of Co-morbidities (includes updates in patient status) 1 10 X- 1 5 Reassessment of Adherence to Treatment Plan ASSESSMENTS - Wound and Skin Assessment / Reassessment X - Simple Wound Assessment / Reassessment - one wound 1 5 []  - 0 Complex Wound Assessment / Reassessment - multiple wounds []  - 0 Dermatologic / Skin Assessment (not related to wound area) ASSESSMENTS - Focused Assessment []  - Circumferential Edema Measurements - multi extremities 0 []  - 0 Nutritional Assessment / Counseling / Intervention []  - 0 Lower Extremity Assessment (monofilament, tuning fork, pulses) []  - 0 Peripheral Arterial Disease Assessment (using hand held doppler) ASSESSMENTS - Ostomy and/or Continence Assessment and Care []  - Incontinence Assessment and Management 0 []  - 0 Ostomy Care Assessment and Management (repouching, etc.) PROCESS - Coordination of Care X - Simple Patient / Family Education for ongoing care 1 15 []  - 0 Complex (extensive) Patient / Family Education for ongoing care []  - 0 Staff obtains , Records, Test Results / Process Orders []  - 0 Staff telephones HHA, Nursing Homes / Clarify orders / etc []  - 0 Routine Transfer to another Facility (non-emergent condition) []  - 0 Routine Hospital Admission (non-emergent condition) []  - 0 New Admissions / / Ordering NPWT, Apligraf, etc. []  - 0 Emergency Hospital Admission (emergent condition) []  - 0 Simple Discharge Coordination []  - 0 Complex (extensive) Discharge Coordination PROCESS - Special Needs []  - Pediatric / Minor Patient Management 0 []  - 0 Isolation Patient Management []  - 0 Hearing / Language / Visual special needs []  - 0 Assessment of Community assistance (transportation, D/C planning, etc.) []  - 0 Additional assistance / Altered  mentation []  - 0 Support Surface(s) Assessment (bed, cushion, seat, etc.) INTERVENTIONS - Wound Cleansing / Measurement [name], [name] ( ) X- 1 5 Simple Wound Cleansing -  one wound []  - 0 Complex Wound Cleansing - multiple wounds X- 1 5 Wound Imaging (photographs - any number of wounds) []  - 0 Wound Tracing (instead of photographs) X- 1 5 Simple Wound Measurement - one wound []  - 0 Complex Wound Measurement - multiple wounds INTERVENTIONS - Wound Dressings X - Small Wound Dressing one or multiple wounds 1 10 []  - 0 Medium Wound Dressing one or multiple wounds []  - 0 Large Wound Dressing one or multiple wounds []  - 0 Application of Medications - topical []  - 0 Application of Medications - injection INTERVENTIONS - Miscellaneous []  - External ear exam 0 []  - 0 Specimen Collection (cultures, biopsies, blood, body fluids, etc.) []  - 0 Specimen(s) / Culture(s) sent or taken to Lab for analysis []  - 0 Patient Transfer (multiple staff / / Similar devices) []  - 0 Simple Staple / Suture removal (25 or less) []  - 0 Complex Staple / Suture removal (26 or more) []  - 0 Hypo / Hyperglycemic Management (close monitor of Blood Glucose) []  - 0 Ankle / Brachial Index (ABI) - do not check if billed separately X- 1 5 Vital Signs Has the patient been seen at the hospital within the last three years: Yes Total Score: 65 Level Of Care: New/Established - Level 2 Electronic Signature(s) Unsigned Entered By on 02/21/2022 15:46:44 Signature(s): Date(s): [name], [name] ( ) -------------------------------------------------------------------------------- Encounter Discharge Information Details Patient Name: [name], [name] Date of Service: 02/21/2022 3:30 PM Medical Record Number: Patient Account Number: Date of Birth/Sex: 1961-03-22 (61 y.o. F) Treating RN: Primary Care [name]: Nurse, adult Other  Clinician: Referring Christropher Gintz: [name], [name] Treating Broderic Bara/Extender: in Treatment: 18 Encounter Discharge Information Items Discharge Condition: Stable Ambulatory Status: Walker Discharge Destination: Home Transportation: Private Auto Accompanied By: self Schedule Follow-up Appointment: Yes Clinical Summary of Care: Patient Declined Electronic Signature(s) Signed: 02/21/2022 3:40:18 PM By: RN Entered By: [name] on 02/21/2022 15:40:17 [name], [name] (Marland Kitchen) -------------------------------------------------------------------------------- Wound Assessment Details Patient Name: [name], [name] Date of Service: 02/21/2022 3:30 PM Medical Record Number: 02/23/2022 Patient Account Number: [id number] Date of Birth/Sex: 1961-12-03 (61 y.o. F) Treating RN: 68 Primary Care Jaasia Viglione: [name], [name] Other Clinician: Referring Kuuipo Anzaldo: [name], [name] Treating Augustine Leverette/Extender: [name] in Treatment: 18 Wound Status Wound Number: 1 Primary Etiology: Dehisced Wound Wound Location: Distal, Midline Back Wound Status: Open Wounding Event: Surgical Injury Comorbid History: Hypertension Date Acquired: 04/11/2021 Weeks Of Treatment: 18 Clustered Wound: No Wound Measurements Length: (cm) 0.5 Width: (cm) 0.5 Depth: (cm) 3 Area: (cm) 0.196 Volume: (cm) 0.589 % Reduction in Area: -55.6% % Reduction in Volume: -1.9% Epithelialization: None Tunneling: No Undermining: No Wound Description Classification: Full Thickness Without Exposed Support Structu Exudate Amount: Medium Exudate Type: Serous Exudate Color: amber res Foul Odor After Cleansing: No Slough/Fibrino No Wound Bed Granulation Amount: Large (67-100%) Exposed Structure Granulation Quality: Red Fascia Exposed: No Necrotic Amount: None Present (0%) Fat Layer (Subcutaneous Tissue) Exposed: Yes Tendon Exposed: No Muscle Exposed: No Joint Exposed: No Bone Exposed:  No Treatment Notes Wound #1 (Back) Wound Laterality: Midline, Distal Cleanser Normal Saline Discharge Instruction: Wash your hands with soap and water. Remove old dressing, discard into plastic bag and place into trash. Cleanse the wound with Normal Saline prior to applying a clean dressing using gauze sponges, not tissues or cotton balls. Do not scrub or use excessive force. Pat dry using gauze sponges, not tissue or cotton balls. Peri-Wound Care Topical  Primary Dressing Hydrofera Blue Classic Foam Rope Dressing, 9x6 (mm/in) Discharge Instruction: cut rope in 1/2 lengthwise AND LEAVE A TAIL OUT TO GRAB IT Secondary Dressing Zetuvit Plus Silicone Border Dressing 4x4 (in/in) Secured With Compression Wrap Compression Stockings [name], [name] ([id number]) Add-Ons Electronic Signature(s) Signed: 02/21/2022 3:36:58 PM By: [name] Pax RN Entered By: [name] on 02/21/2022 15:36:58

## 2022-02-22 ENCOUNTER — Encounter: Payer: 59 | Admitting: Occupational Therapy

## 2022-02-22 NOTE — Progress Notes (Signed)
LASHAWANDA, NORBECK ([id number]) Visit Report for 02/21/2022 Physician Orders Details Patient Name: [name], [name] Date of Service: 02/21/2022 3:30 PM Medical Record Number: [id number] Patient Account Number: [id number] Date of Birth/Sex: 21-Jun-1961 (61 y.o. F) Treating RN: Carlene Coria Primary Care Provider: Clayborn Heron Other Clinician: Referring Provider: Card, John Treating Provider/Extender: Skipper Cliche in Treatment: 62 Verbal / Phone Orders: No Diagnosis Coding Follow-up Appointments o Return Appointment in 1 week. o Nurse Visit as needed - nurse visit on tuesday and Welcome: - [facility] for wound care. May utilize formulary equivalent dressing for wound treatment orders unless otherwise specified. Home Health Nurse may visit [facility] to address patientos wound care needs. - 3 times per week [facility] fax [phone] Bathing/ Shower/ Hygiene o May shower; gently cleanse wound with antibacterial soap, rinse and pat dry prior to dressing wounds o No tub bath. Anesthetic (Use 'Patient Medications' Section for Anesthetic Order Entry) o Lidocaine applied to wound bed Edema Control - Lymphedema / Segmental Compressive Device / Other o Elevate, Exercise Daily and Avoid Standing for Long Periods of Time. o Elevate legs to the level of the heart and pump ankles as often as possible o Elevate leg(s) parallel to the floor when sitting. Additional Orders / Instructions o Follow Nutritious Diet and Increase Protein Intake Wound Treatment Wound #1 - Back Wound Laterality: Midline, Distal Cleanser: Normal Saline 1 x Per Day/30 Days Discharge Instructions: Wash your hands with soap and water. Remove old dressing, discard into plastic bag and place into trash. Cleanse the wound with Normal Saline prior to applying a clean dressing using gauze sponges, not tissues or cotton balls. Do not scrub or use excessive force.  Pat dry using gauze sponges, not tissue or cotton balls. Primary Dressing: Hydrofera Blue Classic Foam Rope Dressing, 9x6 (mm/in) 1 x Per Day/30 Days Discharge Instructions: cut rope in 1/2 lengthwise AND LEAVE A TAIL OUT TO GRAB IT Secondary Dressing: Zetuvit Plus Silicone Border Dressing 4x4 (in/in) 1 x Per Day/30 Days Electronic Signature(s) Signed: 02/21/2022 3:38:59 PM By: Carlene Coria RN Signed: 02/22/2022 4:05:24 PM By: Worthy Keeler PA-C Entered By: Carlene Coria on 02/21/2022 15:38:58 [name], [name] ([id number]) -------------------------------------------------------------------------------- SuperBill Details Patient Name: [name], [name] Date of Service: 02/21/2022 Medical Record Number: [id number] Patient Account Number: [id number] Date of Birth/Sex: 02/05/1961 (61 y.o. 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

## 2022-02-25 ENCOUNTER — Encounter: Payer: 59 | Admitting: Occupational Therapy

## 2022-02-26 ENCOUNTER — Encounter: Payer: 59 | Admitting: Physician Assistant

## 2022-02-26 ENCOUNTER — Other Ambulatory Visit: Payer: Self-pay

## 2022-02-26 DIAGNOSIS — T8131XA Disruption of external operation (surgical) wound, not elsewhere classified, initial encounter: Secondary | ICD-10-CM | POA: Diagnosis not present

## 2022-02-26 NOTE — Progress Notes (Signed)
SARI, SHALES ([id number]) Visit Report for 02/26/2022 Arrival Information Details Patient Name: [name], [name] Date of Service: 02/26/2022 11:00 AM Medical Record Number: [id number] Patient Account Number: [id number] Date of Birth/Sex: 25-Jan-1961 (61 y.o. F) Treating RN: Carlene Coria Primary Care Martinique Pizzimenti: Card, Jenny Reichmann Other Clinician: Referring [place] Doig: Card, John Treating Chinenye Katzenberger/Extender: Skipper Cliche in Treatment: 54 Visit Information History Since Last Visit All ordered tests and consults were completed: No Patient Arrived: [name] Added or deleted any medications: No Arrival Time: 10:49 Any new allergies or adverse reactions: No Accompanied By: self Had a fall or experienced change in No Transfer Assistance: None activities of daily living that may affect Patient Identification Verified: Yes risk of falls: Secondary Verification Process Completed: Yes Signs or symptoms of abuse/neglect since last visito No Patient Requires Transmission-Based Precautions: No Hospitalized since last visit: No Patient Has Alerts: No Implantable device outside of the clinic excluding No cellular tissue based products placed in the center since last visit: Has Dressing in Place as Prescribed: Yes Pain Present Now: Yes Electronic Signature(s) Signed: 02/26/2022 4:37:51 PM By: Carlene Coria RN Entered By: Carlene Coria on 02/26/2022 10:56:12 Germond, Liley CMarland Kitchen ([id number]) -------------------------------------------------------------------------------- Clinic Level of Care Assessment Details Patient Name: [name], [name] Date of Service: 02/26/2022 11:00 AM Medical Record Number: [id number] Patient Account Number: [id number] Date of Birth/Sex: 1961-07-17 (61 y.o. F) Treating RN: Carlene Coria Primary Care Jadesola Poynter: Card, Jenny Reichmann Other Clinician: Referring Najmah Carradine: Card, John Treating Macaiah Mangal/Extender: Skipper Cliche in Treatment: 19 Clinic Level of Care Assessment Items TOOL  4 Quantity Score X - Use when only an EandM is performed on FOLLOW-UP visit 1 0 ASSESSMENTS - Nursing Assessment / Reassessment X - Reassessment of Co-morbidities (includes updates in patient status) 1 10 X- 1 5 Reassessment of Adherence to Treatment Plan ASSESSMENTS - Wound and Skin Assessment / Reassessment X - Simple Wound Assessment / Reassessment - one wound 1 5 []  - 0 Complex Wound Assessment / Reassessment - multiple wounds []  - 0 Dermatologic / Skin Assessment (not related to wound area) ASSESSMENTS - Focused Assessment []  - Circumferential Edema Measurements - multi extremities 0 []  - 0 Nutritional Assessment / Counseling / Intervention []  - 0 Lower Extremity Assessment (monofilament, tuning fork, pulses) []  - 0 Peripheral Arterial Disease Assessment (using hand held doppler) ASSESSMENTS - Ostomy and/or Continence Assessment and Care []  - Incontinence Assessment and Management 0 []  - 0 Ostomy Care Assessment and Management (repouching, etc.) PROCESS - Coordination of Care X - Simple Patient / Family Education for ongoing care 1 15 []  - 0 Complex (extensive) Patient / Family Education for ongoing care []  - 0 Staff obtains Programmer, systems, Records, Test Results / Process Orders []  - 0 Staff telephones HHA, Nursing Homes / Clarify orders / etc []  - 0 Routine Transfer to another Facility (non-emergent condition) []  - 0 Routine Hospital Admission (non-emergent condition) []  - 0 New Admissions / Biomedical engineer / Ordering NPWT, Apligraf, etc. []  - 0 Emergency Hospital Admission (emergent condition) X- 1 10 Simple Discharge Coordination []  - 0 Complex (extensive) Discharge Coordination PROCESS - Special Needs []  - Pediatric / Minor Patient Management 0 []  - 0 Isolation Patient Management []  - 0 Hearing / Language / Visual special needs []  - 0 Assessment of Community assistance (transportation, D/C planning, etc.) []  - 0 Additional assistance / Altered  mentation []  - 0 Support Surface(s) Assessment (bed, cushion, seat, etc.) INTERVENTIONS - Wound Cleansing / Measurement Sami, Anyah C. ([id number]) X- 1 5 Simple Wound Cleansing -  one wound []  - 0 Complex Wound Cleansing - multiple wounds X- 1 5 Wound Imaging (photographs - any number of wounds) []  - 0 Wound Tracing (instead of photographs) X- 1 5 Simple Wound Measurement - one wound []  - 0 Complex Wound Measurement - multiple wounds INTERVENTIONS - Wound Dressings []  - Small Wound Dressing one or multiple wounds 0 X- 1 15 Medium Wound Dressing one or multiple wounds []  - 0 Large Wound Dressing one or multiple wounds []  - 0 Application of Medications - topical []  - 0 Application of Medications - injection INTERVENTIONS - Miscellaneous []  - External ear exam 0 []  - 0 Specimen Collection (cultures, biopsies, blood, body fluids, etc.) []  - 0 Specimen(s) / Culture(s) sent or taken to Lab for analysis []  - 0 Patient Transfer (multiple staff / Civil Service fast streamer / Similar devices) []  - 0 Simple Staple / Suture removal (25 or less) []  - 0 Complex Staple / Suture removal (26 or more) []  - 0 Hypo / Hyperglycemic Management (close monitor of Blood Glucose) []  - 0 Ankle / Brachial Index (ABI) - do not check if billed separately X- 1 5 Vital Signs Has the patient been seen at the hospital within the last three years: Yes Total Score: 80 Level Of Care: New/Established - Level 3 Electronic Signature(s) Signed: 02/26/2022 4:37:51 PM By: Carlene Coria RN Entered By: Carlene Coria on 02/26/2022 11:07:16 Karis, Lakasha C. ([id number]) -------------------------------------------------------------------------------- Complex / Palliative Patient Assessment Details Patient Name: [name], [name] Date of Service: 02/26/2022 11:00 AM Medical Record Number: [id number] Patient Account Number: [id number] Date of Birth/Sex: 1961/03/21 (61 y.o. F) Treating RN: Donnamarie Poag Primary Care  Issaiah Seabrooks: Clayborn Heron Other Clinician: Referring Lynae Pederson: Card, John Treating Schon Zeiders/Extender: Skipper Cliche in Treatment: 19 Complex Wound Management Criteria Patient has remarkable or complex co-morbidities requiring medications or treatments that extend wound healing times. Examples: o Diabetes mellitus with chronic renal failure or end stage renal disease requiring dialysis o Advanced or poorly controlled rheumatoid arthritis o Diabetes mellitus and end stage chronic obstructive pulmonary disease o Active cancer with current chemo- or radiation therapy morbid obesity, hypertension, MS, deconditioning Palliative Wound Management Criteria Care Approach Wound Care Plan: Complex Wound Management Electronic Signature(s) Signed: 02/26/2022 11:21:23 AM By: Donnamarie Poag Signed: 02/26/2022 4:37:47 PM By: Worthy Keeler PA-C Previous Signature: 02/26/2022 11:02:57 AM Version By: Donnamarie Poag Entered By: Donnamarie Poag on 02/26/2022 11:21:23 Virts, Mercedez CMarland Kitchen ([id number]) -------------------------------------------------------------------------------- Encounter Discharge Information Details Patient Name: Northington, Nickia C. Date of Service: 02/26/2022 11:00 AM Medical Record Number: [id number] Patient Account Number: [id number] Date of Birth/Sex: November 02, 1961 (61 y.o. F) Treating RN: Carlene Coria Primary Care Katheleen Stella: Clayborn Heron Other Clinician: Referring Doniesha Landau: Card, John Treating Ethon Wymer/Extender: Skipper Cliche in Treatment: 19 Encounter Discharge Information Items Discharge Condition: Stable Ambulatory Status: Walker Discharge Destination: Home Transportation: Private Auto Accompanied By: self Schedule Follow-up Appointment: Yes Clinical Summary of Care: Patient Declined Electronic Signature(s) Signed: 02/26/2022 4:37:51 PM By: Carlene Coria RN Entered By: Carlene Coria on 02/26/2022 11:13:09 Otte, Jacqeline C.  ([id number]) -------------------------------------------------------------------------------- Lower Extremity Assessment Details Patient Name: Vick, Ronalda C. Date of Service: 02/26/2022 11:00 AM Medical Record Number: [id number] Patient Account Number: [id number] Date of Birth/Sex: [birth year] (61 y.o. F) Treating RN: Carlene Coria Primary Care Maisen Klingler: Clayborn Heron Other Clinician: Referring Donni Oglesby: Card, John Treating Tiffanye Hartmann/Extender: Skipper Cliche in Treatment: 19 Electronic Signature(s) Signed: 02/26/2022 4:37:51 PM By: Carlene Coria RN Entered By: Carlene Coria on 02/26/2022 10:59:54 Aguallo, Griselda CMarland Kitchen ([id number]) -------------------------------------------------------------------------------- Multi Wound Chart Details  Patient Name: Mcnamara, Melony C. Date of Service: 02/26/2022 11:00 AM Medical Record Number: [id number] Patient Account Number: [id number] Date of Birth/Sex: 23-Jan-1961 (61 y.o. F) Treating RN: Carlene Coria Primary Care Laporshia Hogen: Clayborn Heron Other Clinician: Referring Zakry Caso: Card, John Treating Lemuel Boodram/Extender: Skipper Cliche in Treatment: 19 Vital Signs Height(in): Pulse(bpm): 90 Weight(lbs): 275 Blood Pressure(mmHg): 136/82 Body Mass Index(BMI): Temperature(F): 98.2 Respiratory Rate(breaths/min): 18 Photos: [N/A:N/A] Wound Location: Distal, Midline Back N/A N/A Wounding Event: Surgical Injury N/A N/A Primary Etiology: Dehisced Wound N/A N/A Comorbid History: Hypertension N/A N/A Date Acquired: 04/11/2021 N/A N/A Weeks of Treatment: 19 N/A N/A Wound Status: Open N/A N/A Wound Recurrence: No N/A N/A Measurements L x W x D (cm) 0.8x0.5x3 N/A N/A Area (cm) : 0.314 N/A N/A Volume (cm) : 0.942 N/A N/A % Reduction in Area: -149.20% N/A N/A % Reduction in Volume: -63.00% N/A N/A Classification: Full Thickness Without Exposed N/A N/A Support Structures Exudate Amount: Medium N/A N/A Exudate Type: Serous N/A N/A Exudate Color: amber N/A  N/A Granulation Amount: Large (67-100%) N/A N/A Granulation Quality: Red N/A N/A Necrotic Amount: None Present (0%) N/A N/A Exposed Structures: Fat Layer (Subcutaneous Tissue): N/A N/A Yes Fascia: No Tendon: No Muscle: No Joint: No Bone: No Epithelialization: None N/A N/A Treatment Notes Electronic Signature(s) Signed: 02/26/2022 4:37:51 PM By: Carlene Coria RN Entered By: Carlene Coria on 02/26/2022 11:03:36 Dickard, Jameya Loletha Grayer ([id number]) -------------------------------------------------------------------------------- Multi-Disciplinary Care Plan Details Patient Name: Parady, Tasia C. Date of Service: 02/26/2022 11:00 AM Medical Record Number: [id number] Patient Account Number: [id number] Date of Birth/Sex: 04/22/1961 (61 y.o. F) Treating RN: Carlene Coria Primary Care Saim Almanza: Clayborn Heron Other Clinician: Referring Brionne Mertz: Card, John Treating Delia Slatten/Extender: Skipper Cliche in Treatment: 19 Active Inactive Wound/Skin Impairment Nursing Diagnoses: Knowledge deficit related to ulceration/compromised skin integrity Goals: Patient/caregiver will verbalize understanding of skin care regimen Date Initiated: 10/15/2021 Target Resolution Date: 02/15/2022 Goal Status: Active Ulcer/skin breakdown will have a volume reduction of 30% by week 4 Date Initiated: 10/15/2021 Date Inactivated: 01/29/2022 Target Resolution Date: 12/15/2021 Goal Status: Unmet Unmet Reason: comorbities Ulcer/skin breakdown will have a volume reduction of 50% by week 8 Date Initiated: 10/15/2021 Date Inactivated: 01/29/2022 Target Resolution Date: 01/15/2022 Goal Status: Unmet Unmet Reason: comorbities Ulcer/skin breakdown will have a volume reduction of 80% by week 12 Date Initiated: 10/15/2021 Date Inactivated: 02/19/2022 Target Resolution Date: 02/15/2022 Goal Status: Unmet Unmet Reason: comorbities Ulcer/skin breakdown will heal within 14 weeks Date Initiated: 10/15/2021 Target Resolution  Date: 03/15/2022 Goal Status: Active Interventions: Assess patient/caregiver ability to obtain necessary supplies Assess patient/caregiver ability to perform ulcer/skin care regimen upon admission and as needed Assess ulceration(s) every visit Notes: Electronic Signature(s) Signed: 02/26/2022 4:37:51 PM By: Carlene Coria RN Entered By: Carlene Coria on 02/26/2022 11:03:26 Waldrep, Elnita C. ([id number]) -------------------------------------------------------------------------------- Pain Assessment Details Patient Name: Ohmer, Zamyiah C. Date of Service: 02/26/2022 11:00 AM Medical Record Number: [id number] Patient Account Number: [id number] Date of Birth/Sex: 1961-02-18 (61 y.o. F) Treating RN: Carlene Coria Primary Care Dariush Mcnellis: Clayborn Heron Other Clinician: Referring Rainelle Sulewski: Card, John Treating Park Beck/Extender: Skipper Cliche in Treatment: 19 Active Problems Location of Pain Severity and Description of Pain Patient Has Paino No Site Locations Pain Management and Medication Current Pain Management: Electronic Signature(s) Signed: 02/26/2022 4:37:51 PM By: Carlene Coria RN Entered By: Carlene Coria on 02/26/2022 10:58:40 Shirkey, Peri Loletha Grayer ([id number]) -------------------------------------------------------------------------------- Patient/Caregiver Education Details Patient Name: Mally, [name] C. Date of Service: 02/26/2022 11:00 AM Medical Record Number: [id number] Patient Account Number: [id number] Date of Birth/Gender: 1961/04/23 (60 y.o.  F) Treating RN: Carlene Coria Primary Care Physician: Clayborn Heron Other Clinician: Referring Physician: Card, John Treating Physician/Extender: Skipper Cliche in Treatment: 1 Education Assessment Education Provided To: Patient Education Topics Provided Wound/Skin Impairment: Methods: Explain/Verbal Responses: State content correctly Electronic Signature(s) Signed: 02/26/2022 4:37:51 PM By: Carlene Coria RN Entered By: Carlene Coria on 02/26/2022 11:07:37 Grams, Jaidalyn C. ([id number]) -------------------------------------------------------------------------------- Wound Assessment Details Patient Name: Rubenstein, Alyannah C. Date of Service: 02/26/2022 11:00 AM Medical Record Number: [id number] Patient Account Number: [id number] Date of Birth/Sex: [birth year] (61 y.o. F) Treating RN: Carlene Coria Primary Care Vyctoria Dickman: Card, Jenny Reichmann Other Clinician: Referring Makynlee Kressin: Card, John Treating Takai Chiaramonte/Extender: Skipper Cliche in Treatment: 19 Wound Status Wound Number: 1 Primary Etiology: Dehisced Wound Wound Location: Distal, Midline Back Wound Status: Open Wounding Event: Surgical Injury Comorbid History: Hypertension Date Acquired: 04/11/2021 Weeks Of Treatment: 19 Clustered Wound: No Photos Wound Measurements Length: (cm) 0.8 Width: (cm) 0.5 Depth: (cm) 3 Area: (cm) 0.314 Volume: (cm) 0.942 % Reduction in Area: -149.2% % Reduction in Volume: -63% Epithelialization: None Tunneling: No Undermining: No Wound Description Classification: Full Thickness Without Exposed Support Structures Exudate Amount: Medium Exudate Type: Serous Exudate Color: amber Foul Odor After Cleansing: No Slough/Fibrino No Wound Bed Granulation Amount: Large (67-100%) Exposed Structure Granulation Quality: Red Fascia Exposed: No Necrotic Amount: None Present (0%) Fat Layer (Subcutaneous Tissue) Exposed: Yes Tendon Exposed: No Muscle Exposed: No Joint Exposed: No Bone Exposed: No Treatment Notes Wound #1 (Back) Wound Laterality: Midline, Distal Cleanser Normal Saline Discharge Instruction: Wash your hands with soap and water. Remove old dressing, discard into plastic bag and place into trash. Cleanse the wound with Normal Saline prior to applying a clean dressing using gauze sponges, not tissues or cotton balls. Do not scrub or use excessive force. Pat dry using gauze sponges, not tissue or cotton balls. Cranmore,  Brynda C. ([id number]) Peri-Wound Care Topical Primary Dressing Hydrofera Blue Classic Foam Rope Dressing, 9x6 (mm/in) Discharge Instruction: cut rope in 1/2 lengthwise AND LEAVE A TAIL OUT TO GRAB IT Secondary Dressing Zetuvit Plus Silicone Border Dressing 4x4 (in/in) Secured With Compression Wrap Compression Stockings Add-Ons Electronic Signature(s) Signed: 02/26/2022 4:37:51 PM By: Carlene Coria RN Entered By: Carlene Coria on 02/26/2022 10:59:35 Buell, Gretel C. ([id number]) -------------------------------------------------------------------------------- Vitals Details Patient Name: Geron, Floris C. Date of Service: 02/26/2022 11:00 AM Medical Record Number: [id number] Patient Account Number: [id number] Date of Birth/Sex: 01/25/1961 (61 y.o. F) Treating RN: Carlene Coria Primary Care Kabeer Hoagland: Clayborn Heron Other Clinician: Referring Yailen Zemaitis: Card, John Treating Verle Wheeling/Extender: Skipper Cliche in Treatment: 19 Vital Signs Time Taken: 10:56 Temperature (F): 98.2 Weight (lbs): 275 Pulse (bpm): 79 Respiratory Rate (breaths/min): 18 Blood Pressure (mmHg): 136/82 Reference Range: 80 - 120 mg / dl Electronic Signature(s) Signed: 02/26/2022 4:37:51 PM By: Carlene Coria RN Entered By: Carlene Coria on 02/26/2022 10:58:31

## 2022-02-26 NOTE — Progress Notes (Addendum)
[name], [name] ([id number]) Visit Report for 02/26/2022 Chief Complaint Document Details Patient Name: [name], [name] Date of Service: 02/26/2022 11:00 AM Medical Record Number: [id number] Patient Account Number: [id number] Date of Birth/Sex: 10-14-1961 (61 y.o. F) Treating RN: [name] Primary Care Provider: Clayborn [name] Other Clinician: Referring Provider: Card, [name] Treating Provider/Extender: [name] in Treatment: 19 Information Obtained from: Patient Chief Complaint Surgical Back Ulcer Electronic Signature(s) Signed: 02/26/2022 11:01:46 AM By: [name] Keeler PA-[name] Entered By: [name] on 02/26/2022 11:01:46 [name], [name] Kitchen ([id number]) -------------------------------------------------------------------------------- HPI Details Patient Name: [name], [name] Date of Service: 02/26/2022 11:00 AM Medical Record Number: [id number] Patient Account Number: [id number] Date of Birth/Sex: [birth year] (61 y.o. F) Treating RN: [name] Primary Care Provider: Clayborn [name] Other Clinician: Referring Provider: Card, [name] Treating Provider/Extender: [name] in Treatment: 19 History of Present Illness HPI Description: 10/15/2021 upon evaluation today patient presents for initial evaluation here in the clinic concerning a surgical ulceration/dehiscence in the lumbar spine region following surgery that she had over the past year. This was actually broken up into 3 separate surgical events. The initial surgical intervention actually was on November 05, 2021 almost a year ago. Subsequently the patient went back in February for a seroma of the area which unfortunately required her to have a repeat surgery to go in and clean this out. And then again this occurred in April where she went back in and again they felt like stitches were coming out and there was an additional seroma. She was placed in a wound VAC initially and then subsequently as it got smaller that  was discontinued. Again right now I will see anything that I think a wound VAC would help with. Nonetheless she definitely has a significant depth to the wound that is going require packing. I actually believe the Hydrofera Blue rope would probably do quite well with this the problem is as much as it is draining she probably needs this to be changed at least every day. She does not really have anyone that can help with that that is the complicating scenario here. With that being said the patient does have a history of multiple sclerosis, hypertension, and again this surgical wound dehiscence in regard to her lumbar spine region. She did have a repeat MRI which was actually completed 10/09/2021. This showed that there was no significant change in the subcutaneous fluid collection/track of the lower lumbar region. This is extending to the level of the fascia unfortunately. This seems to go all the way from the L2 level with a track extending all the way to the fascia at the L4-5 level. Again this is a significant wound and there is significant drainage but does not seem to communicate to the spinal region as far as spinal fluid or otherwise is concerned that is good news. Nonetheless she last saw [name] who is her neurosurgeon on 10/01/2021 that was when he ordered this last MRI she supposed to see him next week as well. With that being said he did not feel like there was any significant issue there but was not sure why this was not healing that is when he ordered the MRI. They were wanting to make sure that this was packed appropriately by home health unfortunately the main issue currently is that home health is completely out of the picture as the patient has exhausted all the home health that that she gets for a year. She is now in a very difficult  predicament where she does not have anyone to help her change the dressing and to be honest that she is not able to do it herself with the location of  the wound being on the midline lumbar spine region. If she does not have anyone that can help it is probably can to be necessary for her to go to a facility for rehab and daily dressing changes as I feel like daily changes which is much drainage that she is having is going to be necessary. 10/23/2021 upon evaluation today patient appears to be doing decently well in regard to her back ulcer. This does seem to be draining a lot less than what it is been doing in the past. With that being said she still has quite a bit of drainage nonetheless. I do think that given time this should improve least I hope so. The good news is she does have home health coming out 3 days a week were doing it 2 days a week and she is paying someone we can to help. 10/30/2021 upon evaluation today patient appears to be doing okay in regard to her back ulcer this is not draining quite as bad as it was in the beginning but he still has quite a bit of drainage noted. I do believe that the patient would benefit from [place] going ahead forward with attempting a wound VAC using the Hydrofera Blue rope to pack with and then subsequently using the VAC externally to actually suction out and help this to fill- in. I think this is our ideal way to try to get things cleared at this point. As it stands I am not certain that we are really making a progress that we want to see near with doing it in the way we are which is packing with the rope. It is a good dressing but I do think it is insufficient for total healing. She just seems to have too much in the way of drainage at this point unfortunately. 11/06/2021 upon evaluation today patient unfortunately continues to have issues with her back ongoing. The good news is her MRI that was repeated showed signs of the size of this area in the lumbar spine region having decreased from 4 cm to 3.5 cm this is definitely not bad news at all. With that being said unfortunately she continues to have issues  with ongoing drainage not as severe as in the beginning but nonetheless still significant. I do think a wound VAC still would be a good way to go although her home health agency nurse apparently has some concerns about the possibility of not being able to keep a seal with this as they had struggles in the past. Nonetheless I explained to the patient that this is much different than what she had previous and that I really feel like it would do much better as far as getting the area taken care of without having any complications or issues here. I think that we should be able to maintain a seal. Nonetheless at this time I did discuss with the patient as well that she probably does need to have a wound VAC in order for [place] to get this moving in the right direction. 11/13/2021 upon evaluation patient's wound bed actually showed signs of significant drainage at this time. She did see the surgeon yesterday he did not see anything that appeared to be infected. Nonetheless he does appear that she is continuing to have areas here that just do not seem  to want to seal up there MRI findings have been negative but nonetheless she continues to have is the seroma that is filling in. I do feel like we need to try to widen the hole so we can get at least a half of the [facility] then this will be better than nothing at this point. 11/20/2021 upon evaluation today patient actually appears to be having less pain at this point which is good news and overall she we still do not have the results of the culture back yet it had to be sent out to [facility] and we do not have the result back yet. Is doing decently well in regard to her wound. Fortunately there does not appear to be any signs of active infection systemically nor locally at this time. 11/27/2021 upon evaluation today patient appears to be doing well with regard to her wound all things considered there does appear to be less drainage than there was previous.  Fortunately I do not see any evidence of worsening of the patient is stating that she is having some issues with back pain. This is somewhat new. Again this I think could be related to the fact that she is having some issues here with infection. We are still waiting to see what the result of her culture shows from susceptibility testing Enterococcus has been identified but we do not know if this is VRE or not. 12/04/2021 upon evaluation today patient appears to be doing well with regard to her wound all things considered. I did have a conversation with Erin from Dr. Lacy Duverney office. Of note she notes that Dr. Joetta Manners really does not want to do anything surgical right now which I completely understand. With that being said I am still leery of how far we will make it getting this to heal short of any type of surgery to open this up and allow [place] to more appropriately packed the wound. Nonetheless I will absolutely give it our best shot as far as that is concerned. I discussed that with the patient today. She voiced understanding. The good news is the drainage today seems to be clear it is no longer cloudy as it was previous I am actually very pleased in that regard. NATILIE, DIEHR ([id number]) 12/11/2021 since have last seen the patient actually did have an conversation with [name] with her neurosurgeon. Again this involve the discussion around whether or not to open the wound and try to apply a wound VAC following. With that being said the decision was made that that may be the best thing to do if the patient was in agreement. Nonetheless I am actually extremely encouraged with what I am seeing today much more than I would have thought. In fact I think that we may be over packing the wound which is why she is having some discomfort at this point as there is much less of the dressing able to get and this time compared to what we saw previous. That is actually really good news. In fact the 6:00  tunnel appears to have filled in is awesome news. At the 12:00 location I am actually able to pack into that area pretty effectively at this point today. In fact I was able to get less of the packing in which I will detail below. 12/18/2021 upon evaluation today patient appears to be doing well with regard to her wound on the back. Fortunately there is no signs of active infection which is great news and overall  very pleased with where things stand today. No fevers, chills, nausea, vomiting, or diarrhea. 01/01/2022 upon evaluation today patient appears to be doing decently well in regard to her wound. Fortunately there does not appear to be any signs of anything worsening which is great news. She still continues to have issues with drainage but this is not nearly as significant as what it was in the past. There is all clear drainage no signs of purulence noted. 01/08/2022 upon evaluation today patient appears to be doing well with regard to her wound. With that being said she tells me that she has been having some increased pain over the past week. It does appear based on the amount of Hydrofera Blue that we removed this is probably over pack. Again it is difficult to know exactly how old the nurses getting all the skin but nonetheless the length of Hydrofera Blue that was utilized was way too much which may be part of the reason why this felt so uncomfortable. Fortunately I think that we can cut back on that we discussed pieces smaller so hopefully that will not be over packed. 01/22/2021 upon evaluation today patient appears to be doing well With regard to there being no signs of infection at this time. The wound does still tunnel mainly up at the 12:00 location. The depth of the tunnel complete from entry point to the base is about 3.5 cm today. With that being said I do believe that overall she is making good progress this is just a very slow process for her which I know has been frustrating as  well. 01/29/2022 upon evaluation today patient appears to be doing well with regard to the wound on her lower back and the lumbar spine region. The good news is the depth that I got this week was a little bit less going up at the 12:00 location as compared to last week. I measured this to be 3.5 cm last week and it was 3.3 cm this week. Again that is a minute shift but nonetheless a good shift in the right direction. Overall I think that we are on the right track. 02/05/2022 upon evaluation today patient appears to be doing well with regard to her wound. In fact right now her measurements are somewhere around 2.9 cm which is definitely smaller and less deep than last week At the 12:00 location. Overall I am very pleased with what we are seeing. 02/19/2022 upon evaluation today patient unfortunately is noting that the wound is actually closing up externally but internally there is still some depth to this. I discussed with her today that we cannot let it close up like this is can end up being a significant issue for her if we allow for that. Subsequently we need to do what we can to try to open this up and keep it open more effectively. She voiced understanding. With that being said we are going to proceed with that procedure today in order to debride away some of the skin on externally that is trying to close and on this. 02/26/2022 upon evaluation patient appears to be doing better in my opinion after we had to open up this area last week. Again she has a significant amount of healing and overall I am extremely pleased with where things stand currently. I do not see any evidence of active infection locally nor systemically at this time which is great news and in general I think that we are on the right track for getting  this hopefully closed final and done. Electronic Signature(s) Signed: 02/26/2022 1:27:52 PM By: [name] Keeler PA-[name] Entered By: [name] on 02/26/2022 13:27:51 [name], [name] ([id number]) -------------------------------------------------------------------------------- Physical Exam Details Patient Name: Gambale, Torre [name]. Date of Service: 02/26/2022 11:00 AM Medical Record Number: [id number] Patient Account Number: [id number] Date of Birth/Sex: 16-Sep-1961 (61 y.o. F) Treating RN: [name] Primary Care Provider: Clayborn [name] Other Clinician: Referring Provider: Card, [name] Treating Provider/Extender: [name] in Treatment: 46 Constitutional Well-nourished and well-hydrated in no acute distress. Respiratory normal breathing without difficulty. Psychiatric this patient is able to make decisions and demonstrates good insight into disease process. Alert and Oriented x 3. pleasant and cooperative. Notes Upon inspection patient's wound bed actually showed signs of good granulation and epithelization at this point. Fortunately I do not see any evidence of active infection locally or systemically which is great news and overall I am extremely pleased with where we stand today. Electronic Signature(s) Signed: 02/26/2022 1:28:04 PM By: [name] Keeler PA-[name] Entered By: [name] on 02/26/2022 13:28:04 [name], [name] ([id number]) -------------------------------------------------------------------------------- Physician Orders Details Patient Name: [name], [name] Date of Service: 02/26/2022 11:00 AM Medical Record Number: [id number] Patient Account Number: [id number] Date of Birth/Sex: 25-Jun-1961 (61 y.o. 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 o Nurse Visit as needed - nurse visit on tuesday and [place]: - [place] for wound care. May utilize formulary equivalent dressing for wound treatment orders unless otherwise specified. Home Health Nurse may visit PRN to address patientos wound care needs. - 3 times per week [facility] fax [phone] Bathing/ Shower/ Hygiene o May shower; gently cleanse wound with antibacterial soap, rinse and pat dry prior to dressing wounds o No tub bath. Anesthetic (Use 'Patient Medications' Section for Anesthetic Order Entry) o Lidocaine applied to wound bed Edema Control - Lymphedema / Segmental Compressive Device / Other o Elevate, Exercise Daily and Avoid Standing for Long Periods of Time. o Elevate legs to the level of the heart and pump ankles as often as possible o Elevate leg(s) parallel to the floor when sitting. Additional Orders / Instructions o Follow Nutritious Diet and Increase Protein Intake Wound Treatment Wound #1 - Back Wound Laterality: Midline, Distal Cleanser: Normal Saline 1 x Per Day/30 Days Discharge Instructions: Wash your hands with soap and water. Remove old dressing, discard into plastic bag and place into trash. Cleanse the wound with Normal Saline prior to applying a clean dressing using gauze sponges, not tissues or cotton balls. Do not scrub or use excessive force. Pat dry using gauze sponges, not tissue or cotton balls. Primary Dressing: Hydrofera Blue Classic Foam Rope Dressing, 9x6 (mm/in) 1 x Per Day/30 Days Discharge Instructions: cut rope in 1/2 lengthwise AND LEAVE A TAIL OUT TO GRAB IT Secondary Dressing: Zetuvit Plus Silicone Border Dressing 4x4 (in/in) 1 x Per Day/30 Days Electronic Signature(s) Signed: 02/26/2022 4:37:47 PM By: [name] Keeler PA-[name] Signed: 02/26/2022 4:37:51 PM By:  [name] Coria RN Entered By: [name] on 02/26/2022 11:05:59 [name], [name] ([id number]) -------------------------------------------------------------------------------- Problem List Details  Patient Name: [name], [name] Date of Service: 02/26/2022 11:00 AM Medical Record Number: [id number] Patient Account Number: [id number] Date of Birth/Sex: 07/09/1961 (61 y.o. 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 ([id number]) -------------------------------------------------------------------------------- Progress Note Details Patient Name: Bellmore, Maxie [name]. Date of Service: 02/26/2022 11:00 AM Medical Record Number: [id number] Patient Account Number: [id number] Date of Birth/Sex: December 24, 1961 (61 y.o. F) Treating RN: [name] Primary Care Provider: Clayborn [name] Other Clinician: Referring Provider: Card, [name] Treating Provider/Extender: [name] in Treatment: 19 Subjective Chief Complaint Information obtained from Patient Surgical Back Ulcer History of Present Illness (HPI) 10/15/2021 upon evaluation today patient presents for initial evaluation here in the clinic concerning a surgical ulceration/dehiscence in the lumbar spine region following surgery that she had over the past  year. This was actually broken up into 3 separate surgical events. The initial surgical intervention actually was on November 05, 2021 almost a year ago. Subsequently the patient went back in February for a seroma of the area which unfortunately required her to have a repeat surgery to go in and clean this out. And then again this occurred in April where she went back in and again they felt like stitches were coming out and there was an additional seroma. She was placed in a wound VAC initially and then subsequently as it got smaller that was discontinued. Again right now I will see anything that I think a wound VAC would help with. Nonetheless she definitely has a significant depth to the wound that is going require packing. I actually believe the Hydrofera Blue rope would probably do quite well with this the problem is as much as it is draining she probably needs this to be changed at least every day. She does not really have anyone that can help with that that is the complicating scenario here. With that being said the patient does have a history of multiple sclerosis, hypertension, and again this surgical wound dehiscence in regard to her lumbar spine region. She did have a repeat MRI which was actually completed 10/09/2021. This showed that there was no significant change in the subcutaneous fluid collection/track of the lower lumbar region. This is extending to the level of the fascia unfortunately. This seems to go all the way from the L2 level with a track extending all the way to the fascia at the L4-5 level. Again this is a significant wound and there is significant drainage but does not seem to communicate to the spinal region as far as spinal fluid or otherwise is concerned that is good news. Nonetheless she last saw [name] who is her neurosurgeon on 10/01/2021 that was when he ordered this last MRI she supposed to see him next week as well. With that being said he did not feel like there  was any significant issue there but was not sure why this was not healing that is when he ordered the MRI. They were wanting to make sure that  this was packed appropriately by home health unfortunately the main issue currently is that home health is completely out of the picture as the patient has exhausted all the home health that that she gets for a year. She is now in a very difficult predicament where she does not have anyone to help her change the dressing and to be honest that she is not able to do it herself with the location of the wound being on the midline lumbar spine region. If she does not have anyone that can help it is probably can to be necessary for her to go to a facility for rehab and daily dressing changes as I feel like daily changes which is much drainage that she is having is going to be necessary. 10/23/2021 upon evaluation today patient appears to be doing decently well in regard to her back ulcer. This does seem to be draining a lot less than what it is been doing in the past. With that being said she still has quite a bit of drainage nonetheless. I do think that given time this should improve least I hope so. The good news is she does have home health coming out 3 days a week were doing it 2 days a week and she is paying someone we can to help. 10/30/2021 upon evaluation today patient appears to be doing okay in regard to her back ulcer this is not draining quite as bad as it was in the beginning but he still has quite a bit of drainage noted. I do believe that the patient would benefit from [place] going ahead forward with attempting a wound VAC using the Hydrofera Blue rope to pack with and then subsequently using the VAC externally to actually suction out and help this to fill- in. I think this is our ideal way to try to get things cleared at this point. As it stands I am not certain that we are really making a progress that we want to see near with doing it in the way we are  which is packing with the rope. It is a good dressing but I do think it is insufficient for total healing. She just seems to have too much in the way of drainage at this point unfortunately. 11/06/2021 upon evaluation today patient unfortunately continues to have issues with her back ongoing. The good news is her MRI that was repeated showed signs of the size of this area in the lumbar spine region having decreased from 4 cm to 3.5 cm this is definitely not bad news at all. With that being said unfortunately she continues to have issues with ongoing drainage not as severe as in the beginning but nonetheless still significant. I do think a wound VAC still would be a good way to go although her home health agency nurse apparently has some concerns about the possibility of not being able to keep a seal with this as they had struggles in the past. Nonetheless I explained to the patient that this is much different than what she had previous and that I really feel like it would do much better as far as getting the area taken care of without having any complications or issues here. I think that we should be able to maintain a seal. Nonetheless at this time I did discuss with the patient as well that she probably does need to have a wound VAC in order for [place] to get this moving in the right direction. 11/13/2021 upon evaluation patient's  wound bed actually showed signs of significant drainage at this time. She did see the surgeon yesterday he did not see anything that appeared to be infected. Nonetheless he does appear that she is continuing to have areas here that just do not seem to want to seal up there MRI findings have been negative but nonetheless she continues to have is the seroma that is filling in. I do feel like we need to try to widen the hole so we can get at least a half of the [facility] then this will be better than nothing at this point. 11/20/2021 upon evaluation today patient actually  appears to be having less pain at this point which is good news and overall she we still do not have the results of the culture back yet it had to be sent out to [facility] and we do not have the result back yet. Is doing decently well in regard to her wound. Fortunately there does not appear to be any signs of active infection systemically nor locally at this time. 11/27/2021 upon evaluation today patient appears to be doing well with regard to her wound all things considered there does appear to be less drainage than there was previous. Fortunately I do not see any evidence of worsening of the patient is stating that she is having some issues with back pain. This is somewhat new. Again this I think could be related to the fact that she is having some issues here with infection. We are still waiting to see what the result of her culture shows from susceptibility testing Enterococcus has been identified but we do not know if this is VRE or not. 12/04/2021 upon evaluation today patient appears to be doing well with regard to her wound all things considered. I did have a conversation with Erin from Dr. Lacy Duverney office. Of note she notes that Dr. Joetta Manners really does not want to do anything surgical right now which I completely [name], [name] ([id number]) understand. With that being said I am still leery of how far we will make it getting this to heal short of any type of surgery to open this up and allow [place] to more appropriately packed the wound. Nonetheless I will absolutely give it our best shot as far as that is concerned. I discussed that with the patient today. She voiced understanding. The good news is the drainage today seems to be clear it is no longer cloudy as it was previous I am actually very pleased in that regard. 12/11/2021 since have last seen the patient actually did have an conversation with [name] with her neurosurgeon. Again this involve the discussion around whether or not to  open the wound and try to apply a wound VAC following. With that being said the decision was made that that may be the best thing to do if the patient was in agreement. Nonetheless I am actually extremely encouraged with what I am seeing today much more than I would have thought. In fact I think that we may be over packing the wound which is why she is having some discomfort at this point as there is much less of the dressing able to get and this time compared to what we saw previous. That is actually really good news. In fact the 6:00 tunnel appears to have filled in is awesome news. At the 12:00 location I am actually able to pack into that area pretty effectively at this point today. In fact I was able  to get less of the packing in which I will detail below. 12/18/2021 upon evaluation today patient appears to be doing well with regard to her wound on the back. Fortunately there is no signs of active infection which is great news and overall very pleased with where things stand today. No fevers, chills, nausea, vomiting, or diarrhea. 01/01/2022 upon evaluation today patient appears to be doing decently well in regard to her wound. Fortunately there does not appear to be any signs of anything worsening which is great news. She still continues to have issues with drainage but this is not nearly as significant as what it was in the past. There is all clear drainage no signs of purulence noted. 01/08/2022 upon evaluation today patient appears to be doing well with regard to her wound. With that being said she tells me that she has been having some increased pain over the past week. It does appear based on the amount of Hydrofera Blue that we removed this is probably over pack. Again it is difficult to know exactly how old the nurses getting all the skin but nonetheless the length of Hydrofera Blue that was utilized was way too much which may be part of the reason why this felt so uncomfortable. Fortunately  I think that we can cut back on that we discussed pieces smaller so hopefully that will not be over packed. 01/22/2021 upon evaluation today patient appears to be doing well With regard to there being no signs of infection at this time. The wound does still tunnel mainly up at the 12:00 location. The depth of the tunnel complete from entry point to the base is about 3.5 cm today. With that being said I do believe that overall she is making good progress this is just a very slow process for her which I know has been frustrating as well. 01/29/2022 upon evaluation today patient appears to be doing well with regard to the wound on her lower back and the lumbar spine region. The good news is the depth that I got this week was a little bit less going up at the 12:00 location as compared to last week. I measured this to be 3.5 cm last week and it was 3.3 cm this week. Again that is a minute shift but nonetheless a good shift in the right direction. Overall I think that we are on the right track. 02/05/2022 upon evaluation today patient appears to be doing well with regard to her wound. In fact right now her measurements are somewhere around 2.9 cm which is definitely smaller and less deep than last week At the 12:00 location. Overall I am very pleased with what we are seeing. 02/19/2022 upon evaluation today patient unfortunately is noting that the wound is actually closing up externally but internally there is still some depth to this. I discussed with her today that we cannot let it close up like this is can end up being a significant issue for her if we allow for that. Subsequently we need to do what we can to try to open this up and keep it open more effectively. She voiced understanding. With that being said we are going to proceed with that procedure today in order to debride away some of the skin on externally that is trying to close and on this. 02/26/2022 upon evaluation patient appears to be doing  better in my opinion after we had to open up this area last week. Again she has a significant amount of  healing and overall I am extremely pleased with where things stand currently. I do not see any evidence of active infection locally nor systemically at this time which is great news and in general I think that we are on the right track for getting this hopefully closed final and done. Objective Constitutional Well-nourished and well-hydrated in no acute distress. Vitals Time Taken: 10:56 AM, Weight: 275 lbs, Temperature: 98.2 F, Pulse: 79 bpm, Respiratory Rate: 18 breaths/min, Blood Pressure: 136/82 mmHg. Respiratory normal breathing without difficulty. Psychiatric this patient is able to make decisions and demonstrates good insight into disease process. Alert and Oriented x 3. pleasant and cooperative. General Notes: Upon inspection patient's wound bed actually showed signs of good granulation and epithelization at this point. Fortunately I do not see any evidence of active infection locally or systemically which is great news and overall I am extremely pleased with where we stand today. Integumentary (Hair, Skin) Wound #1 status is Open. Original cause of wound was Surgical Injury. The date acquired was: 04/11/2021. The wound has been in treatment 19 weeks. The wound is located on the [place] Back. The wound measures 0.8cm length x 0.5cm width x 3cm depth; 0.314cm^2 area and [name], [name] ([id number]) 0.942cm^3 volume. There is Fat Layer (Subcutaneous Tissue) exposed. There is no tunneling or undermining noted. There is a medium amount of serous drainage noted. There is large (67-100%) red granulation within the wound bed. There is no necrotic tissue within the wound bed. 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 Nurse Visit as needed - nurse visit on tuesday and [place]: [place]: - [place] for wound care. May utilize formulary equivalent dressing for wound treatment orders unless otherwise specified. Home Health Nurse may visit PR[facility] to address patient s wound care needs. - 3 times per week [facility] fax [phone] Bathing/ Shower/ Hygiene: May shower; gently cleanse wound with antibacterial soap, rinse and pat dry prior to dressing wounds No tub bath. Anesthetic (Use 'Patient Medications' Section for Anesthetic Order Entry): Lidocaine applied to wound bed Edema Control - Lymphedema / Segmental Compressive Device / Other: Elevate, Exercise Daily and Avoid Standing for Long Periods of Time. Elevate legs to the level of the heart and pump ankles as often as possible Elevate leg(s) parallel to the floor when sitting. Additional Orders / Instructions: Follow Nutritious Diet and Increase Protein Intake WOUND #1: - Back Wound Laterality: Midline, Distal Cleanser: Normal Saline 1 x Per Day/30 Days Discharge Instructions: Wash your hands with soap and water. Remove old dressing, discard into plastic bag and place into trash. Cleanse the wound with Normal Saline prior to applying a clean dressing using gauze sponges, not tissues or cotton balls. Do not scrub or use excessive force. Pat dry using gauze sponges, not tissue or cotton balls. Primary Dressing: Hydrofera Blue Classic Foam Rope Dressing, 9x6 (mm/in) 1 x Per Day/30 Days Discharge Instructions: cut rope in 1/2 lengthwise AND LEAVE A TAIL OUT TO GRAB IT Secondary Dressing: Zetuvit Plus Silicone Border Dressing 4x4 (in/in) 1 x Per Day/30 Days 1. I would recommend currently that we going to continue with the wound care measures as before and the patient is in agreement with plan. This includes the use of  the [facility] dressing which I think is doing a good job.  2. We will continue to cover this with a Zetuvit bordered foam dressing which is doing a good job. 3. I am also can recommend that we have the patient continue to monitor for any signs of infection obviously if anything changes she should let me know. For now she will continue with the oral antibiotic. We will see patient back for reevaluation in 1 week here in the clinic. If anything worsens or changes patient will contact our office for additional recommendations. Electronic Signature(s) Signed: 02/26/2022 1:28:45 PM By: [name] Keeler PA-[name] Entered By: [name] on 02/26/2022 13:28:45 [name], [name] ([id number]) -------------------------------------------------------------------------------- SuperBill Details Patient Name: [name], [name] Date of Service: 02/26/2022 Medical Record Number: [id number] Patient Account Number: [id number] Date of Birth/Sex: [birth year] (61 y.o. 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 G35 Multiple sclerosis I10 Essential (primary) hypertension Facility Procedures CPT4 Code: [id number] Description: 99213 - WOUND CARE VISIT-LEV 3 EST PT Modifier: Quantity: 1 Physician Procedures CPT4 Code: [id number] Description: [id number] - WC PHYS LEVEL 4 - EST PT Modifier: Quantity: 1 CPT4 Code: Description: ICD-10 Diagnosis Description T81.31XA Disruption of external operation (surgical) wound, not elsewhere classifi 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

## 2022-02-27 ENCOUNTER — Encounter: Payer: 59 | Admitting: Occupational Therapy

## 2022-02-28 ENCOUNTER — Encounter: Payer: 59 | Attending: Physician Assistant

## 2022-02-28 ENCOUNTER — Other Ambulatory Visit: Payer: Self-pay

## 2022-02-28 DIAGNOSIS — I10 Essential (primary) hypertension: Secondary | ICD-10-CM | POA: Insufficient documentation

## 2022-02-28 DIAGNOSIS — G35 Multiple sclerosis: Secondary | ICD-10-CM | POA: Diagnosis not present

## 2022-02-28 DIAGNOSIS — L98422 Non-pressure chronic ulcer of back with fat layer exposed: Secondary | ICD-10-CM | POA: Insufficient documentation

## 2022-02-28 DIAGNOSIS — T8131XA Disruption of external operation (surgical) wound, not elsewhere classified, initial encounter: Secondary | ICD-10-CM | POA: Diagnosis not present

## 2022-02-28 NOTE — Progress Notes (Signed)
TAILEY, TOP ([id number]) Visit Report for 02/28/2022 Arrival Information Details Patient Name: [name], [name] Date of Service: 02/28/2022 3:30 PM Medical Record Number: [id number] Patient Account Number: [id number] Date of Birth/Sex: 1961/07/23 (61 y.o. F) Treating RN: Yevonne Pax Primary Care Conleigh Heinlein: Card, Jonny Ruiz Other Clinician: Referring Darlean Warmoth: Card, John Treating Charizma Gardiner/Extender: Rowan Blase in Treatment: 19 Visit Information History Since Last Visit All ordered tests and consults were completed: No Patient Arrived: [name] Added or deleted any medications: No Arrival Time: 16:07 Any new allergies or adverse reactions: No Accompanied By: self Had a fall or experienced change in No Transfer Assistance: None activities of daily living that may affect Patient Identification Verified: Yes risk of falls: Secondary Verification Process Completed: Yes Signs or symptoms of abuse/neglect since last visito No Patient Requires Transmission-Based Precautions: No Hospitalized since last visit: No Patient Has Alerts: No Implantable device outside of the clinic excluding No cellular tissue based products placed in the center since last visit: Has Dressing in Place as Prescribed: Yes Has Compression in Place as Prescribed: Yes Pain Present Now: No Electronic Signature(s) Signed: 02/28/2022 4:07:35 PM By: Yevonne Pax RN Entered By: Yevonne Pax on 02/28/2022 16:07:35 Lun, Juliany [name]. ([id number]) -------------------------------------------------------------------------------- Clinic Level of Care Assessment Details Patient Name: [name], [name] Date of Service: 02/28/2022 3:30 PM Medical Record Number: [id number] Patient Account Number: [id number] Date of Birth/Sex: Jul 23, 1961 (61 y.o. F) Treating RN: Yevonne Pax Primary Care Thelmer Legler: Card, Jonny Ruiz Other Clinician: Referring Curt Oatis: Card, John Treating Addam Goeller/Extender: Rowan Blase in Treatment:  19 Clinic Level of Care Assessment Items TOOL 4 Quantity Score X - Use when only an EandM is performed on FOLLOW-UP visit 1 0 ASSESSMENTS - Nursing Assessment / Reassessment X - Reassessment of Co-morbidities (includes updates in patient status) 1 10 X- 1 5 Reassessment of Adherence to Treatment Plan ASSESSMENTS - Wound and Skin Assessment / Reassessment X - Simple Wound Assessment / Reassessment - one wound 1 5 []  - 0 Complex Wound Assessment / Reassessment - multiple wounds []  - 0 Dermatologic / Skin Assessment (not related to wound area) ASSESSMENTS - Focused Assessment []  - Circumferential Edema Measurements - multi extremities 0 []  - 0 Nutritional Assessment / Counseling / Intervention []  - 0 Lower Extremity Assessment (monofilament, tuning fork, pulses) []  - 0 Peripheral Arterial Disease Assessment (using hand held doppler) ASSESSMENTS - Ostomy and/or Continence Assessment and Care []  - Incontinence Assessment and Management 0 []  - 0 Ostomy Care Assessment and Management (repouching, etc.) PROCESS - Coordination of Care X - Simple Patient / Family Education for ongoing care 1 15 []  - 0 Complex (extensive) Patient / Family Education for ongoing care []  - 0 Staff obtains , Records, Test Results / Process Orders []  - 0 Staff telephones HHA, Nursing Homes / Clarify orders / etc []  - 0 Routine Transfer to another Facility (non-emergent condition) []  - 0 Routine Hospital Admission (non-emergent condition) []  - 0 New Admissions / / Ordering NPWT, Apligraf, etc. []  - 0 Emergency Hospital Admission (emergent condition) X- 1 10 Simple Discharge Coordination []  - 0 Complex (extensive) Discharge Coordination PROCESS - Special Needs []  - Pediatric / Minor Patient Management 0 []  - 0 Isolation Patient Management []  - 0 Hearing / Language / Visual special needs []  - 0 Assessment of Community assistance (transportation, D/[name] planning,  etc.) []  - 0 Additional assistance / Altered mentation []  - 0 Support Surface(s) Assessment (bed, cushion, seat, etc.) INTERVENTIONS - Wound Cleansing / Measurement [name], [name] ( )  X- 1 5 Simple Wound Cleansing - one wound []  - 0 Complex Wound Cleansing - multiple wounds []  - 0 Wound Imaging (photographs - any number of wounds) []  - 0 Wound Tracing (instead of photographs) X- 1 5 Simple Wound Measurement - one wound []  - 0 Complex Wound Measurement - multiple wounds INTERVENTIONS - Wound Dressings []  - Small Wound Dressing one or multiple wounds 0 X- 1 15 Medium Wound Dressing one or multiple wounds []  - 0 Large Wound Dressing one or multiple wounds []  - 0 Application of Medications - topical []  - 0 Application of Medications - injection INTERVENTIONS - Miscellaneous []  - External ear exam 0 []  - 0 Specimen Collection (cultures, biopsies, blood, body fluids, etc.) []  - 0 Specimen(s) / Culture(s) sent or taken to Lab for analysis []  - 0 Patient Transfer (multiple staff / / Similar devices) []  - 0 Simple Staple / Suture removal (25 or less) []  - 0 Complex Staple / Suture removal (26 or more) []  - 0 Hypo / Hyperglycemic Management (close monitor of Blood Glucose) []  - 0 Ankle / Brachial Index (ABI) - do not check if billed separately X- 1 5 Vital Signs Has the patient been seen at the hospital within the last three years: Yes Total Score: 75 Level Of Care: New/Established - Level 2 Electronic Signature(s) Unsigned Entered By on 02/28/2022 16:09:56 Signature(s): Date(s): [name], [name] ( ) -------------------------------------------------------------------------------- Encounter Discharge Information Details Patient Name: [name], [name] Date of Service: 02/28/2022 3:30 PM Medical Record Number: Patient Account Number: Date of Birth/Sex: 1961-08-18 (61 y.o. F) Treating RN: Primary Care Alic Hilburn: Other Clinician: Referring Yannis Gumbs: Card, John Treating Charity Tessier/Extender: Nurse, adult in Treatment: 19 Encounter Discharge Information Items Discharge Condition: Stable Ambulatory Status: Walker Discharge Destination: Home Transportation: Private Auto Accompanied By: self Schedule Follow-up Appointment: Yes Clinical Summary of Care: Patient Declined Electronic Signature(s) Signed: 02/28/2022 4:09:16 PM By: RN Entered By: on 02/28/2022 16:09:16 [name], [name] (04/30/2022) -------------------------------------------------------------------------------- Wound Assessment Details Patient Name: [name], [name] Date of Service: 02/28/2022 3:30 PM Medical Record Number: [id number] Patient Account Number: 04/30/2022 Date of Birth/Sex: 1961-01-11 (61 y.o. F) Treating RN: 09/11/1961 Primary Care Jatasia Gundrum: Card, 68 Other Clinician: Referring Tashana Haberl: Card, John Treating Maxtyn Nuzum/Extender: Yevonne Pax in Treatment: 19 Wound Status Wound Number: 1 Primary Etiology: Dehisced Wound Wound Location: Distal, Midline Back Wound Status: Open Wounding Event: Surgical Injury Comorbid History: Hypertension Date Acquired: 04/11/2021 Weeks Of Treatment: 19 Clustered Wound: No Wound Measurements Length: (cm) 0.8 Width: (cm) 0.5 Depth: (cm) 3 Area: (cm) 0.314 Volume: (cm) 0.942 % Reduction in Area: -149.2% % Reduction in Volume: -63% Epithelialization: None Tunneling: No Undermining: No Wound Description Classification: Full Thickness Without Exposed Support Structu Exudate Amount: Medium Exudate Type: Serous Exudate Color: amber res Foul Odor After Cleansing: No Slough/Fibrino No Wound Bed Granulation Amount: Large (67-100%) Exposed Structure Granulation Quality: Red Fascia Exposed: No Necrotic Amount: None Present (0%) Fat Layer (Subcutaneous Tissue) Exposed: Yes Tendon Exposed: No Muscle  Exposed: No Joint Exposed: No Bone Exposed: No Treatment Notes Wound #1 (Back) Wound Laterality: Midline, Distal Cleanser Normal Saline Discharge Instruction: Wash your hands with soap and water. Remove old dressing, discard into plastic bag and place into trash. Cleanse the wound with Normal Saline prior to applying a clean dressing using gauze sponges, not tissues or cotton balls. Do not scrub or use excessive force. Pat dry using gauze sponges, not tissue  or cotton balls. Peri-Wound Care Topical Primary Dressing Hydrofera Blue Classic Foam Rope Dressing, 9x6 (mm/in) Discharge Instruction: cut rope in 1/2 lengthwise AND LEAVE A TAIL OUT TO GRAB IT Secondary Dressing Zetuvit Plus Silicone Border Dressing 4x4 (in/in) Secured With Compression Wrap Compression Stockings [name], [name] ([id number]) Add-Ons Electronic Signature(s) Signed: 02/28/2022 4:08:03 PM By: Yevonne Pax RN Entered By: Yevonne Pax on 02/28/2022 16:08:03

## 2022-02-28 NOTE — Progress Notes (Signed)
Seiler, Kayleah C. ([id number]) ?Visit Report for 02/28/2022 ?Physician Orders Details ?Patient Name: [name], [name] ?Date of Service: 02/28/2022 3:30 PM ?Medical Record Number: [id number] ?Patient Account Number: [id number] ?Date of Birth/Sex: April 11, 1961 (61 y.o. F) ?Treating RN: Yevonne Pax ?Primary Care Provider: CardJonny Ruiz Other Clinician: ?Referring Provider: Card, John ?Treating Provider/Extender: Allen Derry ?Weeks in Treatment: 19 ?Verbal / Phone Orders: No ?Diagnosis Coding ?Follow-up Appointments ?o Return Appointment in 1 week. ?o Nurse Visit as needed - nurse visit on tuesday and thursday ?Home Health ?o Home Health Company: - [facility] ?o CONTINUE Home Health for wound care. May utilize formulary equivalent dressing for wound treatment orders unless ?otherwise specified. Home Health Nurse may visit PRN to address patientos wound care needs. - 3 times per week ?[facility] fax [phone] ?Bathing/ Shower/ Hygiene ?o May shower; gently cleanse wound with antibacterial soap, rinse and pat dry prior to dressing wounds ?o No tub bath. ?Anesthetic (Use 'Patient Medications' Section for Anesthetic Order Entry) ?o Lidocaine applied to wound bed ?Edema Control - Lymphedema / Segmental Compressive Device / Other ?o Elevate, Exercise Daily and Avoid Standing for Long Periods of Time. ?o Elevate legs to the level of the heart and pump ankles as often as possible ?o Elevate leg(s) parallel to the floor when sitting. ?Additional Orders / Instructions ?o Follow Nutritious Diet and Increase Protein Intake ?Wound Treatment ?Wound #1 - Back Wound Laterality: Midline, Distal ?Cleanser: Normal Saline 1 x Per Day/30 Days ?Discharge Instructions: Wash your hands with soap and water. Remove old dressing, discard into plastic bag and place into trash. ?Cleanse the wound with Normal Saline prior to applying a clean dressing using gauze sponges, not tissues or cotton balls. Do not scrub ?or use excessive force.  Pat dry using gauze sponges, not tissue or cotton balls. ?Primary Dressing: Hydrofera Blue Classic Foam Rope Dressing, 9x6 (mm/in) 1 x Per Day/30 Days ?Discharge Instructions: cut rope in 1/2 lengthwise AND LEAVE A TAIL OUT TO GRAB IT ?Secondary Dressing: Zetuvit Plus Silicone Border Dressing 4x4 (in/in) 1 x Per Day/30 Days ?Electronic Signature(s) ?Signed: 02/28/2022 4:08:42 PM By: Yevonne Pax RN ?Signed: 02/28/2022 5:22:07 PM By: [name] PA-C ?Entered ByYevonne Pax on 02/28/2022 16:08:41 ?Larkin, Lori C. ([id number]) ?-------------------------------------------------------------------------------- ?SuperBill Details ?Patient Name: [name] ?Date of Service: 02/28/2022 ?Medical Record Number: [id number] ?Patient Account Number: [id number] ?Date of Birth/Sex: Apr 30, 1961 (61 y.o. 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

## 2022-03-01 ENCOUNTER — Encounter: Payer: 59 | Admitting: Occupational Therapy

## 2022-03-05 ENCOUNTER — Ambulatory Visit: Payer: 59 | Attending: Geriatric Medicine | Admitting: Occupational Therapy

## 2022-03-05 ENCOUNTER — Other Ambulatory Visit: Payer: Self-pay

## 2022-03-05 ENCOUNTER — Encounter: Payer: 59 | Admitting: Physician Assistant

## 2022-03-05 DIAGNOSIS — I890 Lymphedema, not elsewhere classified: Secondary | ICD-10-CM | POA: Insufficient documentation

## 2022-03-05 DIAGNOSIS — T8131XA Disruption of external operation (surgical) wound, not elsewhere classified, initial encounter: Secondary | ICD-10-CM | POA: Diagnosis not present

## 2022-03-05 NOTE — Progress Notes (Signed)
[name], [name] ([id number]) Visit Report for 03/05/2022 Arrival Information Details Patient Name: [name], [name] Date of Service: 03/05/2022 11:00 AM Medical Record Number: [id number] Patient Account Number: [id number] Date of Birth/Sex: [birth year] (61 y.o. F) Treating RN: Yevonne Pax Primary Care Cheryllynn Sarff: Card, Jonny Ruiz Other Clinician: Referring Wetona Viramontes: Card, John Treating Katria Botts/Extender: Rowan Blase in Treatment: 20 Visit Information History Since Last Visit All ordered tests and consults were completed: No Patient Arrived: [name] Added or deleted any medications: No Arrival Time: 10:53 Any new allergies or adverse reactions: No Accompanied By: self Had a fall or experienced change in No Transfer Assistance: None activities of daily living that may affect Patient Identification Verified: Yes risk of falls: Secondary Verification Process Completed: Yes Signs or symptoms of abuse/neglect since last visito No Patient Requires Transmission-Based Precautions: No Hospitalized since last visit: No Patient Has Alerts: No Implantable device outside of the clinic excluding No cellular tissue based products placed in the center since last visit: Has Dressing in Place as Prescribed: Yes Pain Present Now: No Electronic Signature(s) Signed: 03/05/2022 4:20:37 PM By: Yevonne Pax RN Entered By: Yevonne Pax on 03/05/2022 11:01:26 Placeres, [name] ([id number]) -------------------------------------------------------------------------------- Clinic Level of Care Assessment Details Patient Name: [name], [name] Date of Service: 03/05/2022 11:00 AM Medical Record Number: [id number] Patient Account Number: [id number] Date of Birth/Sex: 03/16/1961 (61 y.o. F) Treating RN: Yevonne Pax Primary Care Corri Delapaz: Card, Jonny Ruiz Other Clinician: Referring Torii Royse: Card, John Treating Zaydin Billey/Extender: Rowan Blase in Treatment: 20 Clinic Level of Care Assessment Items TOOL 4  Quantity Score X - Use when only an EandM is performed on FOLLOW-UP visit 1 0 ASSESSMENTS - Nursing Assessment / Reassessment X - Reassessment of Co-morbidities (includes updates in patient status) 1 10 X- 1 5 Reassessment of Adherence to Treatment Plan ASSESSMENTS - Wound and Skin Assessment / Reassessment X - Simple Wound Assessment / Reassessment - one wound 1 5 []  - 0 Complex Wound Assessment / Reassessment - multiple wounds []  - 0 Dermatologic / Skin Assessment (not related to wound area) ASSESSMENTS - Focused Assessment []  - Circumferential Edema Measurements - multi extremities 0 []  - 0 Nutritional Assessment / Counseling / Intervention []  - 0 Lower Extremity Assessment (monofilament, tuning fork, pulses) []  - 0 Peripheral Arterial Disease Assessment (using hand held doppler) ASSESSMENTS - Ostomy and/or Continence Assessment and Care []  - Incontinence Assessment and Management 0 []  - 0 Ostomy Care Assessment and Management (repouching, etc.) PROCESS - Coordination of Care X - Simple Patient / Family Education for ongoing care 1 15 []  - 0 Complex (extensive) Patient / Family Education for ongoing care []  - 0 Staff obtains , Records, Test Results / Process Orders []  - 0 Staff telephones HHA, Nursing Homes / Clarify orders / etc []  - 0 Routine Transfer to another Facility (non-emergent condition) []  - 0 Routine Hospital Admission (non-emergent condition) []  - 0 New Admissions / / Ordering NPWT, Apligraf, etc. []  - 0 Emergency Hospital Admission (emergent condition) X- 1 10 Simple Discharge Coordination []  - 0 Complex (extensive) Discharge Coordination PROCESS - Special Needs []  - Pediatric / Minor Patient Management 0 []  - 0 Isolation Patient Management []  - 0 Hearing / Language / Visual special needs []  - 0 Assessment of Community assistance (transportation, D/[name] planning, etc.) []  - 0 Additional assistance / Altered  mentation []  - 0 Support Surface(s) Assessment (bed, cushion, seat, etc.) INTERVENTIONS - Wound Cleansing / Measurement [name], [name] ( ) X- 1 5 Simple Wound Cleansing -  one wound  - 0 Complex Wound Cleansing - multiple wounds X- 1 5 Wound Imaging (photographs - any number of wounds)  - 0 Wound Tracing (instead of photographs) X- 1 5 Simple Wound Measurement - one wound  - 0 Complex Wound Measurement - multiple wounds INTERVENTIONS - Wound Dressings  - Small Wound Dressing one or multiple wounds 0 X- 1 15 Medium Wound Dressing one or multiple wounds  - 0 Large Wound Dressing one or multiple wounds  - 0 Application of Medications - topical  - 0 Application of Medications - injection INTERVENTIONS - Miscellaneous  - External ear exam 0  - 0 Specimen Collection (cultures, biopsies, blood, body fluids, etc.)  - 0 Specimen(s) / Culture(s) sent or taken to Lab for analysis  - 0 Patient Transfer (multiple staff / Nurse, adult / Similar devices)  - 0 Simple Staple / Suture removal (25 or less)  - 0 Complex Staple / Suture removal (26 or more)  - 0 Hypo / Hyperglycemic Management (close monitor of Blood Glucose)  - 0 Ankle / Brachial Index (ABI) - do not check if billed separately X- 1 5 Vital Signs Has the patient been seen at the hospital within the last three years: Yes Total Score: 80 Level Of Care: New/Established - Level 3 Electronic Signature(s) Signed: 03/05/2022 4:20:37 PM By: Yevonne Pax RN Entered By: Yevonne Pax on 03/05/2022 11:41:51 [name], [name] ([id number]) -------------------------------------------------------------------------------- Encounter Discharge Information Details Patient Name: [name], [name] Date of Service: 03/05/2022 11:00 AM Medical Record Number: [id number] Patient Account Number: [id number] Date of Birth/Sex: December 18, 1961 (61 y.o. F) Treating RN: Yevonne Pax Primary Care Mirtie Bastyr: [name] Other Clinician: Referring Addilee Neu: Card, John Treating Zachry Hopfensperger/Extender: Rowan Blase in Treatment: 20 Encounter Discharge Information Items Discharge Condition: Stable Ambulatory Status: Walker Discharge Destination: Home Transportation: Private Auto Accompanied By: self Schedule Follow-up Appointment: Yes Clinical Summary of Care: Patient Declined Electronic Signature(s) Signed: 03/05/2022 4:20:37 PM By: Yevonne Pax RN Entered By: Yevonne Pax on 03/05/2022 11:43:14 Brazill, Jolette [name]. ([id number]) -------------------------------------------------------------------------------- Lower Extremity Assessment Details Patient Name: [name], [name] Date of Service: 03/05/2022 11:00 AM Medical Record Number: [id number] Patient Account Number: [id number] Date of Birth/Sex: August 21, 1961 (61 y.o. F) Treating RN: Yevonne Pax Primary Care Wania Longstreth: [name] Other Clinician: Referring Jennika Ringgold: Card, John Treating Breiana Stratmann/Extender: Rowan Blase in Treatment: 20 Electronic Signature(s) Signed: 03/05/2022 4:20:37 PM By: Yevonne Pax RN Entered By: Yevonne Pax on 03/05/2022 11:03:01 Blase, Misha [name] Kitchen ([id number]) -------------------------------------------------------------------------------- Multi Wound Chart Details Patient Name: [name], [name] Date of Service: 03/05/2022 11:00 AM Medical Record Number: [id number] Patient Account Number: [id number] Date of Birth/Sex: 06/13/1961 (61 y.o. F) Treating RN: Yevonne Pax Primary Care Tiffanni Scarfo: [name] Other Clinician: Referring Alfreddie Consalvo: Card, John Treating Jesscia Imm/Extender: Rowan Blase in Treatment: 20 Vital Signs Height(in): Pulse(bpm): 89 Weight(lbs): 275 Blood Pressure(mmHg): 147/76 Body Mass Index(BMI): Temperature(F): 97.9 Respiratory Rate(breaths/min): 18 Photos: [N/A:N/A] Wound Location: Distal, Midline Back N/A N/A Wounding Event: Surgical Injury N/A N/A Primary Etiology: Dehisced Wound N/A  N/A Comorbid History: Hypertension N/A N/A Date Acquired: 04/11/2021 N/A N/A Weeks of Treatment: 20 N/A N/A Wound Status: Open N/A N/A Wound Recurrence: No N/A N/A Measurements L x W x D (cm) 0.5x0.4x2.8 N/A N/A Area (cm) : 0.157 N/A N/A Volume (cm) : 0.44 N/A N/A % Reduction in Area: -24.60% N/A N/A % Reduction in Volume: 23.90% N/A N/A Classification: Full Thickness Without Exposed N/A N/A Support Structures Exudate Amount: Medium N/A N/A Exudate Type: Serous N/A N/A Exudate Color:  amber N/A N/A Granulation Amount: Large (67-100%) N/A N/A Granulation Quality: Red N/A N/A Necrotic Amount: None Present (0%) N/A N/A Exposed Structures: Fat Layer (Subcutaneous Tissue): N/A N/A Yes Fascia: No Tendon: No Muscle: No Joint: No Bone: No Epithelialization: None N/A N/A Treatment Notes Electronic Signature(s) Signed: 03/05/2022 4:20:37 PM By: Yevonne Pax RN Entered By: Yevonne Pax on 03/05/2022 11:03:21 Diskin, Camera Salena [name] ([id number]) -------------------------------------------------------------------------------- Multi-Disciplinary Care Plan Details Patient Name: [name], [name] Date of Service: 03/05/2022 11:00 AM Medical Record Number: [id number] Patient Account Number: [id number] Date of Birth/Sex: 1961/07/30 (61 y.o. F) Treating RN: Yevonne Pax Primary Care Laritza Vokes: [name] Other Clinician: Referring Fadumo Heng: Card, John Treating Roxan Yamamoto/Extender: Rowan Blase in Treatment: 20 Active Inactive Wound/Skin Impairment Nursing Diagnoses: Knowledge deficit related to ulceration/compromised skin integrity Goals: Patient/caregiver will verbalize understanding of skin care regimen Date Initiated: 10/15/2021 Target Resolution Date: 02/15/2022 Goal Status: Active Ulcer/skin breakdown will have a volume reduction of 30% by week 4 Date Initiated: 10/15/2021 Date Inactivated: 01/29/2022 Target Resolution Date: 12/15/2021 Goal Status: Unmet Unmet Reason:  comorbities Ulcer/skin breakdown will have a volume reduction of 50% by week 8 Date Initiated: 10/15/2021 Date Inactivated: 01/29/2022 Target Resolution Date: 01/15/2022 Goal Status: Unmet Unmet Reason: comorbities Ulcer/skin breakdown will have a volume reduction of 80% by week 12 Date Initiated: 10/15/2021 Date Inactivated: 02/19/2022 Target Resolution Date: 02/15/2022 Goal Status: Unmet Unmet Reason: comorbities Ulcer/skin breakdown will heal within 14 weeks Date Initiated: 10/15/2021 Target Resolution Date: 03/15/2022 Goal Status: Active Interventions: Assess patient/caregiver ability to obtain necessary supplies Assess patient/caregiver ability to perform ulcer/skin care regimen upon admission and as needed Assess ulceration(s) every visit Notes: Electronic Signature(s) Signed: 03/05/2022 4:20:37 PM By: Yevonne Pax RN Entered By: Yevonne Pax on 03/05/2022 11:03:12 [name], [name] ([id number]) -------------------------------------------------------------------------------- Pain Assessment Details Patient Name: [name], [name] Date of Service: 03/05/2022 11:00 AM Medical Record Number: [id number] Patient Account Number: [id number] Date of Birth/Sex: 28-Apr-1961 (61 y.o. F) Treating RN: Yevonne Pax Primary Care Taequan Stockhausen: [name] Other Clinician: Referring Kacyn Souder: Card, John Treating Jermario Kalmar/Extender: Rowan Blase in Treatment: 20 Active Problems Location of Pain Severity and Description of Pain Patient Has Paino No Site Locations Pain Management and Medication Current Pain Management: Electronic Signature(s) Signed: 03/05/2022 4:20:37 PM By: Yevonne Pax RN Entered By: Yevonne Pax on 03/05/2022 11:01:54 [name], [name] ([id number]) -------------------------------------------------------------------------------- Patient/Caregiver Education Details Patient Name: [name], [name] Date of Service: 03/05/2022 11:00 AM Medical Record Number:  [id number] Patient Account Number: [id number] Date of Birth/Gender: 1961/02/25 (61 y.o. F) Treating RN: Yevonne Pax Primary Care Physician: [name] Other Clinician: Referring Physician: Card, John Treating Physician/Extender: Rowan Blase in Treatment: 20 Education Assessment Education Provided To: Patient Education Topics Provided Wound/Skin Impairment: Methods: Explain/Verbal Responses: State content correctly Electronic Signature(s) Signed: 03/05/2022 4:20:37 PM By: Yevonne Pax RN Entered By: Yevonne Pax on 03/05/2022 11:42:16 [name], Syrah [name]. ([id number]) -------------------------------------------------------------------------------- Wound Assessment Details Patient Name: Tout, Emmersen [name]. Date of Service: 03/05/2022 11:00 AM Medical Record Number: [id number] Patient Account Number: [id number] Date of Birth/Sex: 1961-05-09 (61 y.o. F) Treating RN: Yevonne Pax Primary Care Dulcie Gammon: Card, Jonny Ruiz Other Clinician: Referring Sophiamarie Nease: Card, John Treating Talynn Lebon/Extender: Rowan Blase in Treatment: 20 Wound Status Wound Number: 1 Primary Etiology: Dehisced Wound Wound Location: Distal, Midline Back Wound Status: Open Wounding Event: Surgical Injury Comorbid History: Hypertension Date Acquired: 04/11/2021 Weeks Of Treatment: 20 Clustered Wound: No Photos Wound Measurements Length: (cm) 0.5 Width: (cm) 0.4 Depth: (cm) 2.8 Area: (cm) 0.157 Volume: (cm) 0.44 % Reduction in Area: -  24.6% % Reduction in Volume: 23.9% Epithelialization: None Tunneling: No Undermining: No Wound Description Classification: Full Thickness Without Exposed Support Structures Exudate Amount: Medium Exudate Type: Serous Exudate Color: amber Foul Odor After Cleansing: No Slough/Fibrino No Wound Bed Granulation Amount: Large (67-100%) Exposed Structure Granulation Quality: Red Fascia Exposed: No Necrotic Amount: None Present (0%) Fat Layer (Subcutaneous Tissue) Exposed:  Yes Tendon Exposed: No Muscle Exposed: No Joint Exposed: No Bone Exposed: No Treatment Notes Wound #1 (Back) Wound Laterality: Midline, Distal Cleanser Normal Saline Discharge Instruction: Wash your hands with soap and water. Remove old dressing, discard into plastic bag and place into trash. Cleanse the wound with Normal Saline prior to applying a clean dressing using gauze sponges, not tissues or cotton balls. Do not scrub or use excessive force. Pat dry using gauze sponges, not tissue or cotton balls. Holwerda, Joesphine [name]. ([id number]) Peri-Wound Care Topical Primary Dressing Hydrofera Blue Classic Foam Rope Dressing, 9x6 (mm/in) Discharge Instruction: cut rope in 1/2 lengthwise AND LEAVE A TAIL OUT TO GRAB IT Secondary Dressing (SILICONE BORDER) Zetuvit Plus SILICONE BORDER Dressing 4x4 (in/in) Secured With Compression Wrap Compression Stockings Add-Ons Electronic Signature(s) Signed: 03/05/2022 4:20:37 PM By: Yevonne Pax RN Entered By: Yevonne Pax on 03/05/2022 11:02:28 Chapel, Preslei [name] Kitchen ([id number]) -------------------------------------------------------------------------------- Vitals Details Patient Name: Trias, Jyla [name]. Date of Service: 03/05/2022 11:00 AM Medical Record Number: [id number] Patient Account Number: [id number] Date of Birth/Sex: Sep 18, 1961 (61 y.o. F) Treating RN: Yevonne Pax Primary Care Crystall Donaldson: [name] Other Clinician: Referring Marino Rogerson: Card, John Treating Yazir Koerber/Extender: Rowan Blase in Treatment: 20 Vital Signs Time Taken: 11:01 Temperature (F): 97.9 Weight (lbs): 275 Pulse (bpm): 89 Respiratory Rate (breaths/min): 18 Blood Pressure (mmHg): 147/76 Reference Range: 80 - 120 mg / dl Electronic Signature(s) Signed: 03/05/2022 4:20:37 PM By: Yevonne Pax RN Entered By: Yevonne Pax on 03/05/2022 11:01:47

## 2022-03-05 NOTE — Patient Instructions (Signed)

## 2022-03-05 NOTE — Progress Notes (Addendum)
[name], [name] ([id number]) Visit Report for 03/05/2022 Chief Complaint Document Details Patient Name: [name], [name] Date of Service: 03/05/2022 11:00 AM Medical Record Number: [id number] Patient Account Number: [id number] Date of Birth/Sex: [birth year] (61 y.o. F) Treating RN: Yevonne Pax Primary Care Provider: [name] Other Clinician: Referring Provider: Card, John Treating Provider/Extender: Rowan Blase in Treatment: 20 Information Obtained from: Patient Chief Complaint Surgical Back Ulcer Electronic Signature(s) Signed: 03/05/2022 11:02:22 AM By: [name] PA-C Entered By: [name] on 03/05/2022 11:02:22 [name], [name] Kitchen ([id number]) -------------------------------------------------------------------------------- HPI Details Patient Name: [name], [name] Date of Service: 03/05/2022 11:00 AM Medical Record Number: [id number] Patient Account Number: [id number] Date of Birth/Sex: 1961-01-15 (61 y.o. F) Treating RN: Yevonne Pax Primary Care Provider: [name] Other Clinician: Referring Provider: Card, John Treating Provider/Extender: Rowan Blase in Treatment: 20 History of Present Illness HPI Description: 10/15/2021 upon evaluation today patient presents for initial evaluation here in the clinic concerning a surgical ulceration/dehiscence in the lumbar spine region following surgery that she had over the past year. This was actually broken up into 3 separate surgical events. The initial surgical intervention actually was on November 05, 2021 almost a year ago. Subsequently the patient went back in February for a seroma of the area which unfortunately required her to have a repeat surgery to go in and clean this out. And then again this occurred in April where she went back in and again they felt like stitches were coming out and there was an additional seroma. She was placed in a wound VAC initially and then subsequently as it got smaller that was  discontinued. Again right now I will see anything that I think a wound VAC would help with. Nonetheless she definitely has a significant depth to the wound that is going require packing. I actually believe the Hydrofera Blue rope would probably do quite well with this the problem is as much as it is draining she probably needs this to be changed at least every day. She does not really have anyone that can help with that that is the complicating scenario here. With that being said the patient does have a history of multiple sclerosis, hypertension, and again this surgical wound dehiscence in regard to her lumbar spine region. She did have a repeat MRI which was actually completed 10/09/2021. This showed that there was no significant change in the subcutaneous fluid collection/track of the lower lumbar region. This is extending to the level of the fascia unfortunately. This seems to go all the way from the L2 level with a track extending all the way to the fascia at the L4-5 level. Again this is a significant wound and there is significant drainage but does not seem to communicate to the spinal region as far as spinal fluid or otherwise is concerned that is good news. Nonetheless she last saw Dr. Franky Macho who is her neurosurgeon on 10/01/2021 that was when he ordered this last MRI she supposed to see him next week as well. With that being said he did not feel like there was any significant issue there but was not sure why this was not healing that is when he ordered the MRI. They were wanting to make sure that this was packed appropriately by home health unfortunately the main issue currently is that home health is completely out of the picture as the patient has exhausted all the home health that that she gets for a year. She is now in a very difficult  predicament where she does not have anyone to help her change the dressing and to be honest that she is not able to do it herself with the location of the  wound being on the midline lumbar spine region. If she does not have anyone that can help it is probably can to be necessary for her to go to a facility for rehab and daily dressing changes as I feel like daily changes which is much drainage that she is having is going to be necessary. 10/23/2021 upon evaluation today patient appears to be doing decently well in regard to her back ulcer. This does seem to be draining a lot less than what it is been doing in the past. With that being said she still has quite a bit of drainage nonetheless. I do think that given time this should improve least I hope so. The good news is she does have home health coming out 3 days a week were doing it 2 days a week and she is paying someone we can to help. 10/30/2021 upon evaluation today patient appears to be doing okay in regard to her back ulcer this is not draining quite as bad as it was in the beginning but he still has quite a bit of drainage noted. I do believe that the patient would benefit from [place] going ahead forward with attempting a wound VAC using the Hydrofera Blue rope to pack with and then subsequently using the VAC externally to actually suction out and help this to fill- in. I think this is our ideal way to try to get things cleared at this point. As it stands I am not certain that we are really making a progress that we want to see near with doing it in the way we are which is packing with the rope. It is a good dressing but I do think it is insufficient for total healing. She just seems to have too much in the way of drainage at this point unfortunately. 11/06/2021 upon evaluation today patient unfortunately continues to have issues with her back ongoing. The good news is her MRI that was repeated showed signs of the size of this area in the lumbar spine region having decreased from 4 cm to 3.5 cm this is definitely not bad news at all. With that being said unfortunately she continues to have issues with  ongoing drainage not as severe as in the beginning but nonetheless still significant. I do think a wound VAC still would be a good way to go although her home health agency nurse apparently has some concerns about the possibility of not being able to keep a seal with this as they had struggles in the past. Nonetheless I explained to the patient that this is much different than what she had previous and that I really feel like it would do much better as far as getting the area taken care of without having any complications or issues here. I think that we should be able to maintain a seal. Nonetheless at this time I did discuss with the patient as well that she probably does need to have a wound VAC in order for [place] to get this moving in the right direction. 11/13/2021 upon evaluation patient's wound bed actually showed signs of significant drainage at this time. She did see the surgeon yesterday he did not see anything that appeared to be infected. Nonetheless he does appear that she is continuing to have areas here that just do not seem  to want to seal up there MRI findings have been negative but nonetheless she continues to have is the seroma that is filling in. I do feel like we need to try to widen the hole so we can get at least a half of the [facility] then this will be better than nothing at this point. 11/20/2021 upon evaluation today patient actually appears to be having less pain at this point which is good news and overall she we still do not have the results of the culture back yet it had to be sent out to [facility] and we do not have the result back yet. Is doing decently well in regard to her wound. Fortunately there does not appear to be any signs of active infection systemically nor locally at this time. 11/27/2021 upon evaluation today patient appears to be doing well with regard to her wound all things considered there does appear to be less drainage than there was previous.  Fortunately I do not see any evidence of worsening of the patient is stating that she is having some issues with back pain. This is somewhat new. Again this I think could be related to the fact that she is having some issues here with infection. We are still waiting to see what the result of her culture shows from susceptibility testing Enterococcus has been identified but we do not know if this is VRE or not. 12/04/2021 upon evaluation today patient appears to be doing well with regard to her wound all things considered. I did have a conversation with Erin from Dr. Lacy Duverney office. Of note she notes that Dr. Joetta Manners really does not want to do anything surgical right now which I completely understand. With that being said I am still leery of how far we will make it getting this to heal short of any type of surgery to open this up and allow [place] to more appropriately packed the wound. Nonetheless I will absolutely give it our best shot as far as that is concerned. I discussed that with the patient today. She voiced understanding. The good news is the drainage today seems to be clear it is no longer cloudy as it was previous I am actually very pleased in that regard. NATILIE, DIEHR ([id number]) 12/11/2021 since have last seen the patient actually did have an conversation with Dr. Christella Noa with her neurosurgeon. Again this involve the discussion around whether or not to open the wound and try to apply a wound VAC following. With that being said the decision was made that that may be the best thing to do if the patient was in agreement. Nonetheless I am actually extremely encouraged with what I am seeing today much more than I would have thought. In fact I think that we may be over packing the wound which is why she is having some discomfort at this point as there is much less of the dressing able to get and this time compared to what we saw previous. That is actually really good news. In fact the 6:00  tunnel appears to have filled in is awesome news. At the 12:00 location I am actually able to pack into that area pretty effectively at this point today. In fact I was able to get less of the packing in which I will detail below. 12/18/2021 upon evaluation today patient appears to be doing well with regard to her wound on the back. Fortunately there is no signs of active infection which is great news and overall  very pleased with where things stand today. No fevers, chills, nausea, vomiting, or diarrhea. 01/01/2022 upon evaluation today patient appears to be doing decently well in regard to her wound. Fortunately there does not appear to be any signs of anything worsening which is great news. She still continues to have issues with drainage but this is not nearly as significant as what it was in the past. There is all clear drainage no signs of purulence noted. 01/08/2022 upon evaluation today patient appears to be doing well with regard to her wound. With that being said she tells me that she has been having some increased pain over the past week. It does appear based on the amount of Hydrofera Blue that we removed this is probably over pack. Again it is difficult to know exactly how old the nurses getting all the skin but nonetheless the length of Hydrofera Blue that was utilized was way too much which may be part of the reason why this felt so uncomfortable. Fortunately I think that we can cut back on that we discussed pieces smaller so hopefully that will not be over packed. 01/22/2021 upon evaluation today patient appears to be doing well With regard to there being no signs of infection at this time. The wound does still tunnel mainly up at the 12:00 location. The depth of the tunnel complete from entry point to the base is about 3.5 cm today. With that being said I do believe that overall she is making good progress this is just a very slow process for her which I know has been frustrating as  well. 01/29/2022 upon evaluation today patient appears to be doing well with regard to the wound on her lower back and the lumbar spine region. The good news is the depth that I got this week was a little bit less going up at the 12:00 location as compared to last week. I measured this to be 3.5 cm last week and it was 3.3 cm this week. Again that is a minute shift but nonetheless a good shift in the right direction. Overall I think that we are on the right track. 02/05/2022 upon evaluation today patient appears to be doing well with regard to her wound. In fact right now her measurements are somewhere around 2.9 cm which is definitely smaller and less deep than last week At the 12:00 location. Overall I am very pleased with what we are seeing. 02/19/2022 upon evaluation today patient unfortunately is noting that the wound is actually closing up externally but internally there is still some depth to this. I discussed with her today that we cannot let it close up like this is can end up being a significant issue for her if we allow for that. Subsequently we need to do what we can to try to open this up and keep it open more effectively. She voiced understanding. With that being said we are going to proceed with that procedure today in order to debride away some of the skin on externally that is trying to close and on this. 02/26/2022 upon evaluation patient appears to be doing better in my opinion after we had to open up this area last week. Again she has a significant amount of healing and overall I am extremely pleased with where things stand currently. I do not see any evidence of active infection locally nor systemically at this time which is great news and in general I think that we are on the right track for getting  this hopefully closed final and done. 03/05/2022 upon evaluation today patient unfortunately is doing a little bit worse with regard to the whole size this is starting to get much  smaller unfortunately. There does not appear to be any signs of active infection locally nor systemically which is great news. With that being said I am concerned about the fact that the patient is doing quite a bit worse with regard to trapping some fluid and almost feels like she may have a fluid pocket that not only goes in and up towards 12:00 but looks back around towards the more superficial subcutaneous tissue. I am very concerned that this is going to be something that is very hard for [place] to heal short of another surgery to open this up and try to wound VAC from the inside out. Even then there is no guarantees this is just a very difficult situation to be perfectly honest. Electronic Signature(s) Signed: 03/05/2022 5:13:09 PM By: [name] PA-C Entered By: [name] on 03/05/2022 17:13:09 [name], [name] Salena Saner ([id number]) -------------------------------------------------------------------------------- Physical Exam Details Patient Name: [name], [name] Date of Service: 03/05/2022 11:00 AM Medical Record Number: [id number] Patient Account Number: [id number] Date of Birth/Sex: May 13, 1961 (61 y.o. F) Treating RN: Yevonne Pax Primary Care Provider: [name] Other Clinician: Referring Provider: Card, John Treating Provider/Extender: Rowan Blase in Treatment: 20 Constitutional Obese and well-hydrated in no acute distress. Respiratory normal breathing without difficulty. Psychiatric this patient is able to make decisions and demonstrates good insight into disease process. Alert and Oriented x 3. pleasant and cooperative. Notes Upon inspection patient's wound actually is showing signs of doing about as well as can be expected she is not having as much pain which is good but unfortunately he is still having a lot of issues here with the closure of this wound. We have been packing with the [facility] which I think is our best option but still although it may be the  best option it still not quite as effective as what I would really like to see. I discussed all this with the patient today as well. Electronic Signature(s) Signed: 03/05/2022 5:14:33 PM By: [name] PA-C Entered By: [name] on 03/05/2022 17:14:33 Paro, Domingo Pulse ([id number]) -------------------------------------------------------------------------------- Physician Orders Details Patient Name: Vanderford, Kassey C. Date of Service: 03/05/2022 11:00 AM Medical Record Number: [id number] Patient Account Number: [id number] Date of Birth/Sex: [birth year] (61 y.o. 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 o Nurse Visit as needed - nurse visit on tuesday and thursday Home Health o Home Health Company: [facility] o [facility] for wound care. May utilize formulary equivalent dressing for wound treatment orders unless otherwise specified. Home Health Nurse may visit PRN to address patientos wound care needs. - 3 times per week [facility] fax [phone] Bathing/ Shower/ Hygiene o May shower; gently cleanse wound with antibacterial soap, rinse and pat dry prior to dressing wounds o No tub bath. Anesthetic (Use 'Patient Medications' Section for Anesthetic Order Entry) o Lidocaine applied to wound bed Edema Control - Lymphedema / Segmental Compressive Device / Other o Elevate, Exercise Daily  and Avoid Standing for Long Periods of Time. o Elevate legs to the level of the heart  and pump ankles as often as possible o Elevate leg(s) parallel to the floor when sitting. Additional Orders / Instructions o Follow Nutritious Diet and Increase Protein Intake Wound Treatment Wound #1 - Back Wound Laterality: Midline, Distal Cleanser: Normal Saline 1 x Per Day/30 Days Discharge Instructions: Wash your hands with soap and water. Remove old dressing, discard into plastic bag and place into trash. Cleanse the wound with Normal Saline prior to applying a clean dressing using gauze sponges, not tissues or cotton balls. Do not scrub or use excessive force. Pat dry using gauze sponges, not tissue or cotton balls. Primary Dressing: Hydrofera Blue Classic Foam Rope Dressing, 9x6 (mm/in) 1 x Per Day/30 Days Discharge Instructions: cut rope in 1/2 lengthwise AND LEAVE A TAIL OUT TO GRAB IT Secondary Dressing: (SILICONE BORDER) Zetuvit Plus SILICONE BORDER Dressing 4x4 (in/in) 1 x Per Day/30 Days Electronic Signature(s) Signed: 03/05/2022 4:20:37 PM By: Yevonne Pax RN Signed: 03/05/2022 7:37:05 PM By: [name] PA-C Entered By: Yevonne Pax on 03/05/2022 11:34:30 [name], [name] ([id number]) -------------------------------------------------------------------------------- Problem List Details Patient Name: [name], [name] Date of Service: 03/05/2022 11:00 AM Medical Record Number: [id number] Patient Account Number: [id number] Date of Birth/Sex: 06/10/1961 (61 y.o. 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 ([id number]) -------------------------------------------------------------------------------- Progress Note Details Patient Name: [name], [name] Date of Service: 03/05/2022 11:00 AM Medical Record Number: [id number] Patient Account Number: [id number] Date of Birth/Sex: 04-10-1961 (61 y.o. F) Treating RN: Yevonne Pax Primary Care Provider: [name] Other Clinician: Referring Provider: Card, John Treating Provider/Extender: Rowan Blase in Treatment: 20 Subjective Chief Complaint Information obtained from Patient Surgical Back Ulcer History of Present Illness (HPI) 10/15/2021 upon evaluation today patient presents for initial evaluation here in the clinic concerning a surgical ulceration/dehiscence in the lumbar spine region following surgery that she had over the past year. This was actually broken up into 3 separate surgical events. The initial surgical intervention actually was on November 05, 2021 almost a year ago. Subsequently the patient went back in February for a seroma of the area which unfortunately required her to have a repeat surgery to go in and clean this out. And then again this occurred in April where she went back in and again they felt like stitches were coming out and there was an additional seroma. She was placed in a wound VAC initially and then subsequently as it got smaller that was discontinued. Again right now I will see anything that I think a wound VAC would help with. Nonetheless she definitely has a significant depth to the wound that is going require packing. I actually believe the Hydrofera Blue rope would probably do quite well with this the problem is as much as it is draining she probably needs this to be changed at least  every day. She does not really have anyone that can  help with that that is the complicating scenario here. With that being said the patient does have a history of multiple sclerosis, hypertension, and again this surgical wound dehiscence in regard to her lumbar spine region. She did have a repeat MRI which was actually completed 10/09/2021. This showed that there was no significant change in the subcutaneous fluid collection/track of the lower lumbar region. This is extending to the level of the fascia unfortunately. This seems to go all the way from the L2 level with a track extending all the way to the fascia at the L4-5 level. Again this is a significant wound and there is significant drainage but does not seem to communicate to the spinal region as far as spinal fluid or otherwise is concerned that is good news. Nonetheless she last saw Dr. Franky Macho who is her neurosurgeon on 10/01/2021 that was when he ordered this last MRI she supposed to see him next week as well. With that being said he did not feel like there was any significant issue there but was not sure why this was not healing that is when he ordered the MRI. They were wanting to make sure that this was packed appropriately by home health unfortunately the main issue currently is that home health is completely out of the picture as the patient has exhausted all the home health that that she gets for a year. She is now in a very difficult predicament where she does not have anyone to help her change the dressing and to be honest that she is not able to do it herself with the location of the wound being on the midline lumbar spine region. If she does not have anyone that can help it is probably can to be necessary for her to go to a facility for rehab and daily dressing changes as I feel like daily changes which is much drainage that she is having is going to be necessary. 10/23/2021 upon evaluation today patient appears to be doing decently  well in regard to her back ulcer. This does seem to be draining a lot less than what it is been doing in the past. With that being said she still has quite a bit of drainage nonetheless. I do think that given time this should improve least I hope so. The good news is she does have home health coming out 3 days a week were doing it 2 days a week and she is paying someone we can to help. 10/30/2021 upon evaluation today patient appears to be doing okay in regard to her back ulcer this is not draining quite as bad as it was in the beginning but he still has quite a bit of drainage noted. I do believe that the patient would benefit from [place] going ahead forward with attempting a wound VAC using the Hydrofera Blue rope to pack with and then subsequently using the VAC externally to actually suction out and help this to fill- in. I think this is our ideal way to try to get things cleared at this point. As it stands I am not certain that we are really making a progress that we want to see near with doing it in the way we are which is packing with the rope. It is a good dressing but I do think it is insufficient for total healing. She just seems to have too much in the way of drainage at this point unfortunately. 11/06/2021 upon evaluation today patient unfortunately continues to have issues with  her back ongoing. The good news is her MRI that was repeated showed signs of the size of this area in the lumbar spine region having decreased from 4 cm to 3.5 cm this is definitely not bad news at all. With that being said unfortunately she continues to have issues with ongoing drainage not as severe as in the beginning but nonetheless still significant. I do think a wound VAC still would be a good way to go although her home health agency nurse apparently has some concerns about the possibility of not being able to keep a seal with this as they had struggles in the past. Nonetheless I explained to the patient that this  is much different than what she had previous and that I really feel like it would do much better as far as getting the area taken care of without having any complications or issues here. I think that we should be able to maintain a seal. Nonetheless at this time I did discuss with the patient as well that she probably does need to have a wound VAC in order for [place] to get this moving in the right direction. 11/13/2021 upon evaluation patient's wound bed actually showed signs of significant drainage at this time. She did see the surgeon yesterday he did not see anything that appeared to be infected. Nonetheless he does appear that she is continuing to have areas here that just do not seem to want to seal up there MRI findings have been negative but nonetheless she continues to have is the seroma that is filling in. I do feel like we need to try to widen the hole so we can get at least a half of the [facility] then this will be better than nothing at this point. 11/20/2021 upon evaluation today patient actually appears to be having less pain at this point which is good news and overall she we still do not have the results of the culture back yet it had to be sent out to [facility] and we do not have the result back yet. Is doing decently well in regard to her wound. Fortunately there does not appear to be any signs of active infection systemically nor locally at this time. 11/27/2021 upon evaluation today patient appears to be doing well with regard to her wound all things considered there does appear to be less drainage than there was previous. Fortunately I do not see any evidence of worsening of the patient is stating that she is having some issues with back pain. This is somewhat new. Again this I think could be related to the fact that she is having some issues here with infection. We are still waiting to see what the result of her culture shows from susceptibility testing Enterococcus has been  identified but we do not know if this is VRE or not. 12/04/2021 upon evaluation today patient appears to be doing well with regard to her wound all things considered. I did have a conversation with Erin from Dr. Sueanne Margarita office. Of note she notes that Dr. Drinda Butts really does not want to do anything surgical right now which I completely [name], [name] ([id number]) understand. With that being said I am still leery of how far we will make it getting this to heal short of any type of surgery to open this up and allow [place] to more appropriately packed the wound. Nonetheless I will absolutely give it our best shot as far as that is concerned. I discussed that with  the patient today. She voiced understanding. The good news is the drainage today seems to be clear it is no longer cloudy as it was previous I am actually very pleased in that regard. 12/11/2021 since have last seen the patient actually did have an conversation with Dr. Franky Macho with her neurosurgeon. Again this involve the discussion around whether or not to open the wound and try to apply a wound VAC following. With that being said the decision was made that that may be the best thing to do if the patient was in agreement. Nonetheless I am actually extremely encouraged with what I am seeing today much more than I would have thought. In fact I think that we may be over packing the wound which is why she is having some discomfort at this point as there is much less of the dressing able to get and this time compared to what we saw previous. That is actually really good news. In fact the 6:00 tunnel appears to have filled in is awesome news. At the 12:00 location I am actually able to pack into that area pretty effectively at this point today. In fact I was able to get less of the packing in which I will detail below. 12/18/2021 upon evaluation today patient appears to be doing well with regard to her wound on the back. Fortunately there is no  signs of active infection which is great news and overall very pleased with where things stand today. No fevers, chills, nausea, vomiting, or diarrhea. 01/01/2022 upon evaluation today patient appears to be doing decently well in regard to her wound. Fortunately there does not appear to be any signs of anything worsening which is great news. She still continues to have issues with drainage but this is not nearly as significant as what it was in the past. There is all clear drainage no signs of purulence noted. 01/08/2022 upon evaluation today patient appears to be doing well with regard to her wound. With that being said she tells me that she has been having some increased pain over the past week. It does appear based on the amount of Hydrofera Blue that we removed this is probably over pack. Again it is difficult to know exactly how old the nurses getting all the skin but nonetheless the length of Hydrofera Blue that was utilized was way too much which may be part of the reason why this felt so uncomfortable. Fortunately I think that we can cut back on that we discussed pieces smaller so hopefully that will not be over packed. 01/22/2021 upon evaluation today patient appears to be doing well With regard to there being no signs of infection at this time. The wound does still tunnel mainly up at the 12:00 location. The depth of the tunnel complete from entry point to the base is about 3.5 cm today. With that being said I do believe that overall she is making good progress this is just a very slow process for her which I know has been frustrating as well. 01/29/2022 upon evaluation today patient appears to be doing well with regard to the wound on her lower back and the lumbar spine region. The good news is the depth that I got this week was a little bit less going up at the 12:00 location as compared to last week. I measured this to be 3.5 cm last week and it was 3.3 cm this week. Again that is a minute  shift but nonetheless a good shift in  the right direction. Overall I think that we are on the right track. 02/05/2022 upon evaluation today patient appears to be doing well with regard to her wound. In fact right now her measurements are somewhere around 2.9 cm which is definitely smaller and less deep than last week At the 12:00 location. Overall I am very pleased with what we are seeing. 02/19/2022 upon evaluation today patient unfortunately is noting that the wound is actually closing up externally but internally there is still some depth to this. I discussed with her today that we cannot let it close up like this is can end up being a significant issue for her if we allow for that. Subsequently we need to do what we can to try to open this up and keep it open more effectively. She voiced understanding. With that being said we are going to proceed with that procedure today in order to debride away some of the skin on externally that is trying to close and on this. 02/26/2022 upon evaluation patient appears to be doing better in my opinion after we had to open up this area last week. Again she has a significant amount of healing and overall I am extremely pleased with where things stand currently. I do not see any evidence of active infection locally nor systemically at this time which is great news and in general I think that we are on the right track for getting this hopefully closed final and done. 03/05/2022 upon evaluation today patient unfortunately is doing a little bit worse with regard to the whole size this is starting to get much smaller unfortunately. There does not appear to be any signs of active infection locally nor systemically which is great news. With that being said I am concerned about the fact that the patient is doing quite a bit worse with regard to trapping some fluid and almost feels like she may have a fluid pocket that not only goes in and up towards 12:00 but looks back around  towards the more superficial subcutaneous tissue. I am very concerned that this is going to be something that is very hard for [place] to heal short of another surgery to open this up and try to wound VAC from the inside out. Even then there is no guarantees this is just a very difficult situation to be perfectly honest. Objective Constitutional Obese and well-hydrated in no acute distress. Vitals Time Taken: 11:01 AM, Weight: 275 lbs, Temperature: 97.9 F, Pulse: 89 bpm, Respiratory Rate: 18 breaths/min, Blood Pressure: 147/76 mmHg. Respiratory normal breathing without difficulty. Psychiatric this patient is able to make decisions and demonstrates good insight into disease process. Alert and Oriented x 3. pleasant and cooperative. [name], [name] ([id number]) General Notes: Upon inspection patient's wound actually is showing signs of doing about as well as can be expected she is not having as much pain which is good but unfortunately he is still having a lot of issues here with the closure of this wound. We have been packing with the [facility] which I think is our best option but still although it may be the best option it still not quite as effective as what I would really like to see. I discussed all this with the patient today as well. Integumentary (Hair, Skin) Wound #1 status is Open. Original cause of wound was Surgical Injury. The date acquired was: 04/11/2021. The wound has been in treatment 20 weeks. The wound is located on the Distal,Midline Back. The wound measures  0.5cm length x 0.4cm width x 2.8cm depth; 0.157cm^2 area and 0.44cm^3 volume. There is Fat Layer (Subcutaneous Tissue) exposed. There is no tunneling or undermining noted. There is a medium amount of serous drainage noted. There is large (67-100%) red granulation within the wound bed. There is no necrotic tissue within the wound bed. 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 Nurse Visit as needed - nurse visit on tuesday and thursday Home Health: Home Health Company: - [facility] for wound care. May utilize formulary equivalent dressing for wound treatment orders unless otherwise specified. Home Health Nurse may visit PRN to address patient s wound care needs. - 3 times per week [facility] fax [phone] Bathing/ Shower/ Hygiene: May shower; gently cleanse wound with antibacterial soap, rinse and pat dry prior to dressing wounds No tub bath. Anesthetic (Use 'Patient Medications' Section for Anesthetic Order Entry): Lidocaine applied to wound bed Edema Control - Lymphedema / Segmental Compressive Device / Other: Elevate, Exercise Daily and Avoid Standing for Long Periods of Time. Elevate legs to the level of the heart and pump ankles as often as possible Elevate leg(s) parallel to the floor when sitting. Additional Orders / Instructions: Follow Nutritious Diet and Increase Protein Intake WOUND #1: - Back Wound Laterality: Midline, Distal Cleanser: Normal Saline 1 x Per Day/30 Days Discharge Instructions: Wash your hands with soap and water. Remove old dressing, discard into plastic bag and place into trash. Cleanse the wound with Normal Saline prior to applying a clean dressing using gauze sponges, not tissues or cotton balls. Do not scrub or use excessive force. Pat dry using gauze sponges, not tissue or cotton balls. Primary Dressing: Hydrofera Blue Classic Foam Rope Dressing, 9x6 (mm/in) 1 x Per Day/30 Days Discharge Instructions: cut rope in 1/2 lengthwise AND LEAVE A TAIL OUT TO GRAB IT Secondary Dressing: (SILICONE BORDER) Zetuvit Plus SILICONE BORDER Dressing 4x4 (in/in) 1 x Per Day/30 Days 1. Based on what I am seeing  currently I discussed with the patient that I do think that surgery could be indicated and we may have to see about a referral for a second opinion. With that being said I do not want to make that referral prematurely and I like to give this 2 more weeks to see if we have any improvements. If were not seeing improvements then it may be that our only option will be to make the referral for that second opinion for surgery. 2. I am also can recommend a border foam dressing to cover after the [facility] is placed into the tunnel. I did actually discuss this patient's case with Dr. Venita Lick at [facility] in [place]. Unfortunately Dr. Shon Baton states that this is definitely not something that he would be able to treat he also states that we probably would only have success in getting someone to take this on possibly at [facility], [place], or [place]. At the 3 that this may be the preferable way to go. I Ernie Hew talk with this option with the patient when I see her at the next visit in 2 weeks. We will see patient back for reevaluation in 1 week here in the clinic. If anything worsens or changes patient will contact our office for additional recommendations. I will see her personally in 2 weeks Dr. Mikey Bussing will be seeing  her next week. KAMEELA, LEIPOLD ([id number]) Electronic Signature(s) Signed: 03/05/2022 5:15:51 PM By: [name] PA-C Entered By: [name] on 03/05/2022 17:15:51 Gallina, Domingo Pulse ([id number]) -------------------------------------------------------------------------------- SuperBill Details Patient Name: Elsen, Kayden C. Date of Service: 03/05/2022 Medical Record Number: [id number] Patient Account Number: [id number] Date of Birth/Sex: 20-Nov-1961 (61 y.o. 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 G35 Multiple sclerosis I10 Essential (primary) hypertension Facility Procedures CPT4 Code: [id number] Description: 99213 - WOUND CARE VISIT-LEV 3 EST PT Modifier: Quantity: 1 Physician Procedures CPT4 Code: 2725366 Description: 99214 - WC PHYS LEVEL 4 - EST PT Modifier: Quantity: 1 CPT4 Code: Description: ICD-10 Diagnosis Description T81.31XA Disruption of external operation (surgical) wound, not elsewhere classifi 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

## 2022-03-06 NOTE — Therapy (Signed)
[facility] MAIN [facility] SERVICES [address] [place], [place], [address] Phone: [phone]   Fax:  [phone]  Occupational Therapy Treatment  Patient Details  Name: [name] MRN: [id number] Date of Birth: 02-Nov-1961 Referring Provider (OT): Aggie Cosier Arringtonm FNP   Encounter Date: 03/05/2022   OT End of Session - 03/05/22 1246     Visit Number 2    Number of Visits 36    Date for OT Re-Evaluation 05/15/22    OT Start Time 0100    OT Stop Time 0204    OT Time Calculation (min) 64 min    Activity Tolerance Patient tolerated treatment well;No increased pain    Behavior During Therapy WFL for tasks assessed/performed             Past Medical History:  Diagnosis Date   Arthritis    Back pain    Chronic knee pain    Edema of both lower extremities    High blood pressure    High cholesterol    Joint pain    Multiple sclerosis (HCC)    Neuromuscular disorder (HCC)    Multiple Sclerosis over 20 years   Obesity    Vitamin D deficiency     Past Surgical History:  Procedure Laterality Date   APPENDECTOMY     CESAREAN SECTION     HAND SURGERY     lumbar back surgery     LUMBAR WOUND DEBRIDEMENT N/A 12/05/2020   Procedure: Lumbar Wound Exploration;  Surgeon: Coletta Memos, MD;  Location: [facility] OR;  Service: Neurosurgery;  Laterality: N/A;  3C/RM 21   LUMBAR WOUND DEBRIDEMENT N/A 02/21/2021   Procedure: Lumbar wound exploration;  Surgeon: Coletta Memos, MD;  Location[facility] OR;  Service: Neurosurgery;  Laterality: N/A;  posterior   LUMBAR WOUND DEBRIDEMENT N/A 04/11/2021   Procedure: Wound Exploration with Wound Vac Placement;  Surgeon: Coletta Memos, MD;  Location: [facility] OR;  Service: Neurosurgery;  Laterality: N/A;   ROTATOR CUFF REPAIR     TONSILLECTOMY      There were no vitals filed for this visit.   Subjective Assessment - 03/05/22 1249     Subjective  [name] returns to OT for treatment visit 2/36 to address BLE  lymphedema. Pt reports /110 LE-related leg pain.  Pt rates OA related pain in her knees as 2-3/10. Pt reports her physician discharged Norvasc and will reassess BP in a couple of weeks.    Pertinent History B knee OA, HTN, Obesity, MS, Spondylolisthesis-lumbar, open wound at lumbar surgical site since 4/22.    Limitations difficulty walking, impaired transfers, chronic OA pain in bilateral knees, non healing post surgical lumbar wound, BLE muscle weakness 2/2 MS, chronic leg swelling with minimal pain,    Special Tests - Stemmer bilaterally; Intake FOTO score =31/100    Patient Stated Goals get my legs better so I can do more    Pain Onset Other (comment)   6 mnths ago without precipitating event. No family hx   Pain Onset Other (comment)   years                         OT Treatments/Exercises (OP) - 03/05/22 1312       ADLs   ADL Education Given Yes      Manual Therapy   Manual Therapy Edema management;Compression Bandaging    Edema Management Initial BLE comparative limb volumetrics-A-D- from ankle to popliteal fossa  Compression Bandaging LLE multilayer compression wrap from base of toes to popliteal fossa using 1 ea 8, 10 and 12 cm wide short stretch bandage over cotton stockinette and 0.4 cm thick Rosidal foam.                    OT Education - 03/06/22 1302     Education Details Continued skilled Pt/caregiver education  And LE ADL training throughout visit for lymphedema self care/ home program, including compression wrapping, compression garment and device wear/care, lymphatic pumping ther ex, simple self-MLD, and skin care. Discussed initial volumetric measurements. Discussed all long term goals.    Person(s) Educated Patient    Methods Explanation;Demonstration;Handout    Comprehension Verbalized understanding;Returned demonstration;Need further instruction                 OT Long Term Goals - 02/15/22 1210       OT LONG TERM GOAL #1    Title Given this patients Intake score 31 /100 on the functional outcomes FOTO tool, patient will experience an increase in function of 5 points to improve basic and instrumental ADLs performance, including lymphedema self-care.    Baseline 31/100    Period Weeks    Status New    Target Date 05/16/22      OT LONG TERM GOAL #2   Title Pt will demonstrate understanding of lymphedema prevention strategies by identifying and discussing 5 precautions using printed reference (modified assistance) to reduce risk of progression and to limit infection risk.    Baseline Max A    Time 4    Period Days    Status New    Target Date --   4th OT Rx visit     OT LONG TERM GOAL #3   Title With Maximum caregiver assistance Pt will be able to apply multilayer, LUE compression wraps using gradient techniques to decrease limb volume, to limit infection risk, and to limit lymphedema progression. Pt unable to wrap legs independently. Trained caregiver must assist during visit intervals for CDT to be effective.    Baseline dependent - Max caregiver assistance is necessary to complete Intensive Phase of Complete Decongestive Therapy and Lymphedema self-care home program  as Pt is unable to reach his feet to apply compression wraps and garments, to inspect skin, to groom nails to perform skin care and inspection and to perform simple self-MLD. Pt agrees to arrange for consistent caregiver prior to commencing CDT.    Time 4    Period Days    Status New    Target Date --   4th OT Rx visit     OT LONG TERM GOAL #4   Title With Maximum caregiver assistance between OT sessions Pt will achieve at least a 10% limb volume reduction below the knee bilaterally to return limb to more normal size and shape, to limit infection risk, to decrease pain, to improve function, and to limit lymphedema progression.    Baseline dependent    Time 12    Period Weeks    Status New    Target Date 05/16/22      OT LONG TERM GOAL #5    Title With caregiver assistance  Pt will achieve and sustain a least 85% compliance with all 4 LE self-care home program components throughout Intensive Phase CDT, including modified simple self-MLD, daily skin inspection and care, lymphatic pumping the ex, 23/7 compression wraps to optimize limb volume reductions, to limit lymphedema progression and to limit further functional  decline.    Baseline Dependent    Time 12    Period Weeks    Target Date 05/16/22                   Plan - 03/05/22 1258     Clinical Impression Statement Completed initial BLE comparative limb volumetrics below the knee. Initial Limb volume differential (LVD) for leg below knee to ankle (A-D) = 7.39%, L (dominant) >R. Initial application of LLE, knee length , multilayer compression wraps using gradient techniques. Pt tolerated all aspects of session without increased pain. Cont as per POC.    OT Occupational Profile and History Detailed Assessment- Review of Records and additional review of physical, cognitive, psychosocial history related to current functional performance    Occupational performance deficits (Please refer to evaluation for details): ADL's;IADL's;Rest and Sleep;Leisure;Social Participation;Work    Psychiatric nurse Several treatment options, min-mod task modification necessary    Comorbidities Affecting Occupational Performance: May have comorbidities impacting occupational performance    Modification or Assistance to Complete Evaluation  Min-Moderate modification of tasks or assist with assess necessary to complete eval    OT Frequency 2x / week    OT Duration 12 weeks    OT Treatment/Interventions Self-care/ADL training;DME and/or AE instruction;Manual lymph drainage;Compression bandaging;Therapeutic activities;Coping strategies training;Therapeutic exercise;Other (comment);Manual Therapy;Energy conservation;Patient/family education   skin care, Flexitouch trial,  fit with appropriate compression garments that are comfortable, effective and that Pt is able to don and doff using assistive devices PRN   Recommended Other Services " Calcium channel blockers may not directly impair lymphatic function. However our results show that a reduced lymphatic function predisposes to CBC edema, which may explain why some patients develop edema during treatment." [employer], Niklas Telinius, Harlow Ohms, and Vibeke Hjortdal.  Reduced Lymphatic Function Predisposes to Calcium Channel Blocker Edema: A Randomized Placebo-Controlled Clinical Trial.  Lymphatic Research and Biology.Apr [email or web address]  Published in Volume: 18 Issue 2: April 16, 2019  Online Ahead of Print:August 18, 2018    Consulted and Agree with Plan of Care Patient             Patient will benefit from skilled therapeutic intervention in order to improve the following deficits and impairments:           Visit Diagnosis: Lymphedema, not elsewhere classified    Problem List Patient Active Problem List   Diagnosis Date Noted   Postoperative seroma of subcutaneous tissue after non-dermatologic procedure 04/11/2021   Wound drainage 02/21/2021   Wound dehiscence 02/21/2021   Postoperative seroma of musculoskeletal structure after musculoskeletal procedure 12/05/2020   Spondylolisthesis of lumbar region 11/02/2020   Insulin resistance 02/15/2020   Class 3 severe obesity with serious comorbidity and body mass index (BMI) of 45.0 to 49.9 in adult (HCC) 01/19/2020   Vitamin D deficiency 01/18/2020   Other hyperlipidemia 02/22/2019   Primary osteoarthritis of both knees 10/14/2018   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   Essential hypertension 02/16/2016   Multiple sclerosis (HCC) 04/06/2013    Loel Dubonnet, MS, OTR/L, CLT-LANA 03/06/22 1:04 PM    [facility] MAIN [facility] SERVICES [address] [place], [place],  [address] Phone: [phone]   Fax:  [phone]  Name: VINAYA SANCHO MRN: [id number] Date of Birth: [birth year]

## 2022-03-07 ENCOUNTER — Other Ambulatory Visit: Payer: Self-pay

## 2022-03-07 ENCOUNTER — Encounter: Payer: 59 | Admitting: Internal Medicine

## 2022-03-07 DIAGNOSIS — T8131XA Disruption of external operation (surgical) wound, not elsewhere classified, initial encounter: Secondary | ICD-10-CM | POA: Diagnosis not present

## 2022-03-08 ENCOUNTER — Ambulatory Visit: Payer: 59 | Admitting: Occupational Therapy

## 2022-03-08 DIAGNOSIS — I890 Lymphedema, not elsewhere classified: Secondary | ICD-10-CM

## 2022-03-08 NOTE — Therapy (Signed)
[place] MAIN [facility] SERVICES [address] [place], [name], [address] Phone: [phone]   Fax:  [phone]  Occupational Therapy Treatment  Patient Details  Name: [name] MRN: [id number] Date of Birth: [birth year] Referring Provider (OT): Donella Stade Arringtonm FNP   Encounter Date: 03/08/2022   OT End of Session - 03/08/22 1105     Visit Number 3    Number of Visits 36    Date for OT Re-Evaluation 05/15/22    OT Start Time 1100    OT Stop Time 1200    OT Time Calculation (min) 60 min    Activity Tolerance Patient tolerated treatment well;No increased pain    Behavior During Therapy WFL for tasks assessed/performed             Past Medical History:  Diagnosis Date   Arthritis    Back pain    Chronic knee pain    Edema of both lower extremities    High blood pressure    High cholesterol    Joint pain    Multiple sclerosis ([place])    Neuromuscular disorder (HCC)    Multiple Sclerosis over 20 years   Obesity    Vitamin D deficiency     Past Surgical History:  Procedure Laterality Date   APPENDECTOMY     CESAREAN SECTION     HAND SURGERY     lumbar back surgery     LUMBAR WOUND DEBRIDEMENT N/A 12/05/2020   Procedure: Lumbar Wound Exploration;  Surgeon: Ashok Pall, MD;  Location: [place];  Service: Neurosurgery;  Laterality: N/A;  3C/RM 21   LUMBAR WOUND DEBRIDEMENT N/A 02/21/2021   Procedure: Lumbar wound exploration;  Surgeon: Ashok Pall, MD;  Location: [place];  Service: Neurosurgery;  Laterality: N/A;  posterior   LUMBAR WOUND DEBRIDEMENT N/A 04/11/2021   Procedure: Wound Exploration with Wound Vac Placement;  Surgeon: Ashok Pall, MD;  Location: [place];  Service: Neurosurgery;  Laterality: N/A;   ROTATOR CUFF REPAIR     TONSILLECTOMY      There were no vitals filed for this visit.   Subjective Assessment - 03/08/22 1108     Subjective  [name] returns to OT for treatment visit 3/36 to address BLE  lymphedema. Pt rates OA related pain in her knees as 1/10. Pt reports she was upset and cried after last visit because , "this lymphedema hit me hard."because    Pertinent History B knee OA, HTN, Obesity, MS, Spondylolisthesis-lumbar, open wound at lumbar surgical site since 4/22.    Limitations difficulty walking, impaired transfers, chronic OA pain in bilateral knees, non healing post surgical lumbar wound, BLE muscle weakness 2/2 MS, chronic leg swelling with minimal pain,    Special Tests - Stemmer bilaterally; Intake FOTO score =31/100    Patient Stated Goals get my legs better so I can do more    Pain Onset Other (comment)   6 mnths ago without precipitating event. No family hx   Pain Onset Other (comment)   years                                 OT Education - 03/08/22 1152     Education Details Emphasis of Pt edu on multilayer short stretch compression wrapping vs long stretch wraps. How short stretch wraps move  lymphatic congestion by accentuating pumping action of leg muscles. Demonstrated application and educated on wear  and care routines.    Person(s) Educated Patient    Methods Explanation;Demonstration;Handout;Verbal cues;Tactile cues    Comprehension Verbalized understanding;Returned demonstration;Need further instruction                 OT Long Term Goals - 02/15/22 1210       OT LONG TERM GOAL #1   Title Given this patients Intake score 31 /100 on the functional outcomes FOTO tool, patient will experience an increase in function of 5 points to improve basic and instrumental ADLs performance, including lymphedema self-care.    Baseline 31/100    Period Weeks    Status New    Target Date 05/16/22      OT LONG TERM GOAL #2   Title Pt will demonstrate understanding of lymphedema prevention strategies by identifying and discussing 5 precautions using printed reference (modified assistance) to reduce risk of progression and to limit infection  risk.    Baseline Max A    Time 4    Period Days    Status New    Target Date --   4th OT Rx visit     OT LONG TERM GOAL #3   Title With Maximum caregiver assistance Pt will be able to apply multilayer, LUE compression wraps using gradient techniques to decrease limb volume, to limit infection risk, and to limit lymphedema progression. Pt unable to wrap legs independently. Trained caregiver must assist during visit intervals for CDT to be effective.    Baseline dependent - Max caregiver assistance is necessary to complete Intensive Phase of Complete Decongestive Therapy and Lymphedema self-care home program  as Pt is unable to reach his feet to apply compression wraps and garments, to inspect skin, to groom nails to perform skin care and inspection and to perform simple self-MLD. Pt agrees to arrange for consistent caregiver prior to commencing CDT.    Time 4    Period Days    Status New    Target Date --   4th OT Rx visit     OT LONG TERM GOAL #4   Title With Maximum caregiver assistance between OT sessions Pt will achieve at least a 10% limb volume reduction below the knee bilaterally to return limb to more normal size and shape, to limit infection risk, to decrease pain, to improve function, and to limit lymphedema progression.    Baseline dependent    Time 12    Period Weeks    Status New    Target Date 05/16/22      OT LONG TERM GOAL #5   Title With caregiver assistance  Pt will achieve and sustain a least 85% compliance with all 4 LE self-care home program components throughout Intensive Phase CDT, including modified simple self-MLD, daily skin inspection and care, lymphatic pumping the ex, 23/7 compression wraps to optimize limb volume reductions, to limit lymphedema progression and to limit further functional decline.    Baseline Dependent    Time 12    Period Weeks    Target Date 05/16/22                   Plan - 03/08/22 1106     Clinical Impression Statement Ms.  Brue tolerated multilayer compression wraps applied last visit without difficulty. She was able to wear  wraps 24 hours and noted reduction in leg swelling after removing them. Today's session spent on teaching self bandaging from foot to below the knee using gradient techniques. By end of session Pt is  able to apply wraps her leg with moderate assist  Cont as per POC.    OT Occupational Profile and History Detailed Assessment- Review of Records and additional review of physical, cognitive, psychosocial history related to current functional performance    Occupational performance deficits (Please refer to evaluation for details): ADL's;IADL's;Rest and Sleep;Leisure;Social Participation;Work    Writer Several treatment options, min-mod task modification necessary    Comorbidities Affecting Occupational Performance: May have comorbidities impacting occupational performance    Modification or Assistance to Complete Evaluation  Min-Moderate modification of tasks or assist with assess necessary to complete eval    OT Frequency 2x / week    OT Duration 12 weeks    OT Treatment/Interventions Self-care/ADL training;DME and/or AE instruction;Manual lymph drainage;Compression bandaging;Therapeutic activities;Coping strategies training;Therapeutic exercise;Other (comment);Manual Therapy;Energy conservation;Patient/family education   skin care, Flexitouch trial, fit with appropriate compression garments that are comfortable, effective and that Pt is able to don and doff using assistive devices PRN   Recommended Other Services " Calcium channel blockers may not directly impair lymphatic function. However our results show that a reduced lymphatic function predisposes to CBC edema, which may explain why some patients develop edema during treatment." [employer], Niklas Telinius, Claiborne Billings, and Vibeke Hjortdal.  Reduced Lymphatic Function Predisposes to Calcium  Channel Blocker Edema: A Randomized Placebo-Controlled Clinical Trial.  Lymphatic Research and Biology.Apr [email or web address]  Published in Volume: 18 Issue 2: April 16, 2019  Online Ahead of Print:August 18, 2018    Consulted and Agree with Plan of Care Patient             Patient will benefit from skilled therapeutic intervention in order to improve the following deficits and impairments:           Visit Diagnosis: Lymphedema, not elsewhere classified    Problem List Patient Active Problem List   Diagnosis Date Noted   Postoperative seroma of subcutaneous tissue after non-dermatologic procedure 04/11/2021   Wound drainage 02/21/2021   Wound dehiscence 02/21/2021   Postoperative seroma of musculoskeletal structure after musculoskeletal procedure 12/05/2020   Spondylolisthesis of lumbar region 11/02/2020   Insulin resistance 02/15/2020   Class 3 severe obesity with serious comorbidity and body mass index (BMI) of 45.0 to 49.9 in adult ([place]) 01/19/2020   Vitamin D deficiency 01/18/2020   Other hyperlipidemia 02/22/2019   Primary osteoarthritis of both knees 10/14/2018   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   Essential hypertension 02/16/2016   Multiple sclerosis ([place]) 04/06/2013    Andrey Spearman, MS, OTR/L, CLT-LANA 03/08/22 11:54 AM   [place] MAIN [facility] SERVICES [place], [name], [address] Phone: [phone]   Fax:  [phone]  Name: CECILIA MORLEY MRN: [id number] Date of Birth: February 09, 1961

## 2022-03-11 NOTE — Progress Notes (Signed)
CERISE, LIEBER ([id number]) Visit Report for 03/07/2022 Arrival Information Details Patient Name: [name], [name] Date of Service: 03/07/2022 12:30 PM Medical Record Number: [id number] Patient Account Number: [id number] Date of Birth/Sex: January 01, 1961 (60 y.o. F) Treating RN: [name] Primary Care [name]: [name] Other Clinician: Referring [name]: Card, [name]: [name] in Treatment: 20 Visit Information History Since Last Visit All ordered tests and consults were completed: No Patient Arrived: [name] Added or deleted any medications: No Arrival Time: 12:56 Any new allergies or adverse reactions: No Accompanied By: self Had a fall or experienced change in No Transfer Assistance: None activities of daily living that may affect Patient Identification Verified: Yes risk of falls: Secondary Verification Process Completed: Yes Signs or symptoms of abuse/neglect since last visito No Patient Requires Transmission-Based Precautions: No Hospitalized since last visit: No Patient Has Alerts: No Implantable device outside of the clinic excluding No cellular tissue based products placed in the center since last visit: Has Dressing in Place as Prescribed: Yes Pain Present Now: No Electronic Signature(s) Signed: 03/11/2022 4:21:53 PM By: [name] Pax RN Entered By: [name] on 03/07/2022 12:56:53 [name], [name] ([id number]) -------------------------------------------------------------------------------- Clinic Level of Care Assessment Details Patient Name: [name], [name] Date of Service: 03/07/2022 12:30 PM Medical Record Number: [id number] Patient Account Number: [id number] Date of Birth/Sex: 1961/03/07 (61 y.o. F) Treating RN: [name] Primary Care [name]: Card, [name] Other Clinician: Referring [name]: Card, [name] Treating [name]/Extender: [name] in Treatment: 20 Clinic Level of Care Assessment  Items TOOL 4 Quantity Score X - Use when only an EandM is performed on FOLLOW-UP visit 1 0 ASSESSMENTS - Nursing Assessment / Reassessment X - Reassessment of Co-morbidities (includes updates in patient status) 1 10 X- 1 5 Reassessment of Adherence to Treatment Plan ASSESSMENTS - Wound and Skin Assessment / Reassessment X - Simple Wound Assessment / Reassessment - one wound 1 5 []  - 0 Complex Wound Assessment / Reassessment - multiple wounds []  - 0 Dermatologic / Skin Assessment (not related to wound area) ASSESSMENTS - Focused Assessment []  - Circumferential Edema Measurements - multi extremities 0 []  - 0 Nutritional Assessment / Counseling / Intervention []  - 0 Lower Extremity Assessment (monofilament, tuning fork, pulses) []  - 0 Peripheral Arterial Disease Assessment (using hand held doppler) ASSESSMENTS - Ostomy and/or Continence Assessment and Care []  - Incontinence Assessment and Management 0 []  - 0 Ostomy Care Assessment and Management (repouching, etc.) PROCESS - Coordination of Care X - Simple Patient / Family Education for ongoing care 1 15 []  - 0 Complex (extensive) Patient / Family Education for ongoing care []  - 0 Staff obtains , Records, Test Results / Process Orders []  - 0 Staff telephones HHA, Nursing Homes / Clarify orders / etc []  - 0 Routine Transfer to another Facility (non-emergent condition) []  - 0 Routine Hospital Admission (non-emergent condition) []  - 0 New Admissions / / Ordering NPWT, Apligraf, etc. []  - 0 Emergency Hospital Admission (emergent condition) X- 1 10 Simple Discharge Coordination []  - 0 Complex (extensive) Discharge Coordination PROCESS - Special Needs []  - Pediatric / Minor Patient Management 0 []  - 0 Isolation Patient Management []  - 0 Hearing / Language / Visual special needs []  - 0 Assessment of Community assistance (transportation, D/C planning, etc.) []  - 0 Additional assistance /  Altered mentation []  - 0 Support Surface(s) Assessment (bed, cushion, seat, etc.) INTERVENTIONS - Wound Cleansing / Measurement [place], [name] ( ) X- 1 5 Simple Wound Cleansing -  one wound []  - 0 Complex Wound Cleansing - multiple wounds X- 1 5 Wound Imaging (photographs - any number of wounds) []  - 0 Wound Tracing (instead of photographs) X- 1 5 Simple Wound Measurement - one wound []  - 0 Complex Wound Measurement - multiple wounds INTERVENTIONS - Wound Dressings []  - Small Wound Dressing one or multiple wounds 0 X- 1 15 Medium Wound Dressing one or multiple wounds []  - 0 Large Wound Dressing one or multiple wounds []  - 0 Application of Medications - topical []  - 0 Application of Medications - injection INTERVENTIONS - Miscellaneous []  - External ear exam 0 []  - 0 Specimen Collection (cultures, biopsies, blood, body fluids, etc.) []  - 0 Specimen(s) / Culture(s) sent or taken to Lab for analysis []  - 0 Patient Transfer (multiple staff / Nurse, adult / Similar devices) []  - 0 Simple Staple / Suture removal (25 or less) []  - 0 Complex Staple / Suture removal (26 or more) []  - 0 Hypo / Hyperglycemic Management (close monitor of Blood Glucose) []  - 0 Ankle / Brachial Index (ABI) - do not check if billed separately []  - 0 Vital Signs Has the patient been seen at the hospital within the last three years: Yes Total Score: 75 Level Of Care: New/Established - Level 2 Electronic Signature(s) Signed: 03/11/2022 4:21:53 PM By: [name] Pax RN Entered By: [name] on 03/07/2022 13:00:40 [name], [name] ([id number]) -------------------------------------------------------------------------------- Encounter Discharge Information Details Patient Name: [name], [name] Date of Service: 03/07/2022 12:30 PM Medical Record Number: [id number] Patient Account Number: [id number] Date of Birth/Sex: 02-26-1961 (61 y.o. F) Treating RN: [name] Primary Care  [name]: [name] Other Clinician: Referring [name]: Card, [name] Treating Yassir Enis/Extender: [name] in Treatment: 20 Encounter Discharge Information Items Discharge Condition: Stable Ambulatory Status: Walker Discharge Destination: Home Transportation: Private Auto Accompanied By: self Schedule Follow-up Appointment: Yes Clinical Summary of Care: Patient Declined Electronic Signature(s) Signed: 03/11/2022 4:21:53 PM By: [name] Pax RN Entered By: [name] on 03/07/2022 12:59:45 [name], [name] ([id number]) -------------------------------------------------------------------------------- Wound Assessment Details Patient Name: [name], [name] Date of Service: 03/07/2022 12:30 PM Medical Record Number: [id number] Patient Account Number: [id number] Date of Birth/Sex: [birth year] (61 y.o. F) Treating RN: [name] Primary Care Lyncoln Ledgerwood: Card, [name] Other Clinician: Referring Milarose Savich: Card, [name] Treating Alijah Akram/Extender: [name] in Treatment: 20 Wound Status Wound Number: 1 Primary Etiology: Dehisced Wound Wound Location: Distal, Midline Back Wound Status: Open Wounding Event: Surgical Injury Comorbid History: Hypertension Date Acquired: 04/11/2021 Weeks Of Treatment: 20 Clustered Wound: No Wound Measurements Length: (cm) 0.5 Width: (cm) 0.4 Depth: (cm) 2.8 Area: (cm) 0.157 Volume: (cm) 0.44 % Reduction in Area: -24.6% % Reduction in Volume: 23.9% Epithelialization: None Tunneling: No Undermining: No Wound Description Classification: Full Thickness Without Exposed Support Structu Exudate Amount: Medium Exudate Type: Serous Exudate Color: amber res Foul Odor After Cleansing: No Slough/Fibrino No Wound Bed Granulation Amount: Large (67-100%) Exposed Structure Granulation Quality: Red Fascia Exposed: No Necrotic Amount: None Present (0%) Fat Layer (Subcutaneous Tissue) Exposed: Yes Tendon Exposed: No Muscle Exposed:  No Joint Exposed: No Bone Exposed: No Treatment Notes Wound #1 (Back) Wound Laterality: Midline, Distal Cleanser Normal Saline Discharge Instruction: Wash your hands with soap and water. Remove old dressing, discard into plastic bag and place into trash. Cleanse the wound with Normal Saline prior to applying a clean dressing using gauze sponges, not tissues or cotton balls. Do not scrub or use excessive force. Pat dry using gauze sponges, not tissue or cotton  balls. Peri-Wound Care Topical Primary Dressing Hydrofera Blue Classic Foam Rope Dressing, 9x6 (mm/in) Discharge Instruction: cut rope in 1/2 lengthwise AND LEAVE A TAIL OUT TO GRAB IT Secondary Dressing (SILICONE BORDER) Zetuvit Plus SILICONE BORDER Dressing 4x4 (in/in) Secured With Compression Wrap Compression Stockings [name], [name] ([id number]) Add-Ons Electronic Signature(s) Signed: 03/11/2022 4:21:53 PM By: [name] Pax RN Entered By: [name] on 03/07/2022 12:57:32

## 2022-03-11 NOTE — Progress Notes (Signed)
[name], [name] ([id number]) ?Visit Report for 03/07/2022 ?Physician Orders Details ?Patient Name: [name], [name] ?Date of Service: 03/07/2022 12:30 PM ?Medical Record Number: [id number] ?Patient Account Number: [id number] ?Date of Birth/Sex: [birth year] (61 y.o. F) ?Treating RN: Yevonne Pax ?Primary Care Provider: CardJonny Ruiz Other Clinician: ?Referring Provider: Card, John ?Treating Provider/Extender: Geralyn Corwin ?Weeks in Treatment: 20 ?Verbal / Phone Orders: No ?Diagnosis Coding ?Follow-up Appointments ?o Return Appointment in 1 week. ?o Nurse Visit as needed - nurse visit on tuesday and thursday ?Home Health ?o Home Health Company: - [facility] ?o CONTINUE Home Health for wound care. May utilize formulary equivalent dressing for wound treatment orders unless ?otherwise specified. Home Health Nurse may visit PRN to address patientos wound care needs. - 3 times per week ?[facility] fax [phone] ?Bathing/ Shower/ Hygiene ?o May shower; gently cleanse wound with antibacterial soap, rinse and pat dry prior to dressing wounds ?o No tub bath. ?Anesthetic (Use 'Patient Medications' Section for Anesthetic Order Entry) ?o Lidocaine applied to wound bed ?Edema Control - Lymphedema / Segmental Compressive Device / Other ?o Elevate, Exercise Daily and Avoid Standing for Long Periods of Time. ?o Elevate legs to the level of the heart and pump ankles as often as possible ?o Elevate leg(s) parallel to the floor when sitting. ?Additional Orders / Instructions ?o Follow Nutritious Diet and Increase Protein Intake ?Wound Treatment ?Wound #1 - Back Wound Laterality: Midline, Distal ?Cleanser: Normal Saline 1 x Per Day/30 Days ?Discharge Instructions: Wash your hands with soap and water. Remove old dressing, discard into plastic bag and place into trash. ?Cleanse the wound with Normal Saline prior to applying a clean dressing using gauze sponges, not tissues or cotton balls. Do not scrub ?or use excessive  force. Pat dry using gauze sponges, not tissue or cotton balls. ?Primary Dressing: Hydrofera Blue Classic Foam Rope Dressing, 9x6 (mm/in) 1 x Per Day/30 Days ?Discharge Instructions: cut rope in 1/2 lengthwise AND LEAVE A TAIL OUT TO GRAB IT ?Secondary Dressing: (SILICONE BORDER) Zetuvit Plus SILICONE BORDER Dressing 4x4 (in/in) 1 x Per Day/30 Days ?Electronic Signature(s) ?Signed: 03/07/2022 1:43:45 PM By: Geralyn Corwin DO ?Signed: 03/11/2022 4:21:53 PM By: Yevonne Pax RN ?Entered By: Yevonne Pax on 03/07/2022 12:58:42 ?[name], [name] ([id number]) ?-------------------------------------------------------------------------------- ?SuperBill Details ?Patient Name: [name], [name] ?Date of Service: 03/07/2022 ?Medical Record Number: [id number] ?Patient Account Number: [id number] ?Date of Birth/Sex: 17-Apr-1961 (61 y.o. 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

## 2022-03-12 ENCOUNTER — Other Ambulatory Visit: Payer: Self-pay

## 2022-03-12 ENCOUNTER — Ambulatory Visit: Payer: 59 | Admitting: Occupational Therapy

## 2022-03-12 DIAGNOSIS — I890 Lymphedema, not elsewhere classified: Secondary | ICD-10-CM | POA: Diagnosis not present

## 2022-03-12 DIAGNOSIS — T8131XA Disruption of external operation (surgical) wound, not elsewhere classified, initial encounter: Secondary | ICD-10-CM | POA: Diagnosis not present

## 2022-03-12 NOTE — Progress Notes (Signed)
Robb, Taryne C. ([id number]) ?Visit Report for 03/12/2022 ?Arrival Information Details ?Patient Name: [name], [name] ?Date of Service: 03/12/2022 11:00 AM ?Medical Record Number: [id number] ?Patient Account Number: [id number] ?Date of Birth/Sex: [birth year] (61 y.o. F) ?Treating RN: Yevonne Pax ?Primary Care Halei Hanover: CardJonny Ruiz Other Clinician: ?Referring Azriel Dancy: Card, John ?Treating Kyndle Schlender/Extender: Geralyn Corwin ?Weeks in Treatment: 21 ?Visit Information History Since Last Visit ?All ordered tests and consults were completed: No ?Patient Arrived: [name] ?Added or deleted any medications: No ?Arrival Time: 11:10 ?Any new allergies or adverse reactions: No ?Accompanied By: self ?Had a fall or experienced change in No ?Transfer Assistance: None ?activities of daily living that may affect ?Patient Identification Verified: Yes ?risk of falls: ?Secondary Verification Process Completed: Yes ?Signs or symptoms of abuse/neglect since last visito No ?Patient Requires Transmission-Based Precautions: No ?Hospitalized since last visit: No ?Patient Has Alerts: No ?Implantable device outside of the clinic excluding No ?cellular tissue based products placed in the center ?since last visit: ?Has Dressing in Place as Prescribed: Yes ?Pain Present Now: No ?Electronic Signature(s) ?Signed: 03/12/2022 11:22:07 AM By: Yevonne Pax RN ?Entered By: Yevonne Pax on 03/12/2022 11:22:07 ?Demps, Jadee C. ([id number]) ?-------------------------------------------------------------------------------- ?Clinic Level of Care Assessment Details ?Patient Name: [name], [name] ?Date of Service: 03/12/2022 11:00 AM ?Medical Record Number: [id number] ?Patient Account Number: [id number] ?Date of Birth/Sex: 06/01/1961 (61 y.o. F) ?Treating RN: Yevonne Pax ?Primary Care Newt Levingston: CardJonny Ruiz Other Clinician: ?Referring Doria Fern: Card, John ?Treating Olusegun Gerstenberger/Extender: Geralyn Corwin ?Weeks in Treatment: 21 ?Clinic Level of Care Assessment  Items ?TOOL 4 Quantity Score ?X - Use when only an EandM is performed on FOLLOW-UP visit 1 0 ?ASSESSMENTS - Nursing Assessment / Reassessment ?X - Reassessment of Co-morbidities (includes updates in patient status) 1 10 ?X- 1 5 ?Reassessment of Adherence to Treatment Plan ?ASSESSMENTS - Wound and Skin Assessment / Reassessment ?[]  - Simple Wound Assessment / Reassessment - one wound 0 ?[]  - 0 ?Complex Wound Assessment / Reassessment - multiple wounds ?[]  - 0 ?Dermatologic / Skin Assessment (not related to wound area) ?ASSESSMENTS - Focused Assessment ?[]  - Circumferential Edema Measurements - multi extremities 0 ?[]  - 0 ?Nutritional Assessment / Counseling / Intervention ?[]  - 0 ?Lower Extremity Assessment (monofilament, tuning fork, pulses) ?[]  - 0 ?Peripheral Arterial Disease Assessment (using hand held doppler) ?ASSESSMENTS - Ostomy and/or Continence Assessment and Care ?[]  - Incontinence Assessment and Management 0 ?[]  - 0 ?Ostomy Care Assessment and Management (repouching, etc.) ?PROCESS - Coordination of Care ?X - Simple Patient / Family Education for ongoing care 1 15 ?[]  - 0 ?Complex (extensive) Patient / Family Education for ongoing care ?[]  - 0 ?Staff obtains Consents, Records, Test Results / Process Orders ?[]  - 0 ?Staff telephones HHA, Nursing Homes / Clarify orders / etc ?[]  - 0 ?Routine Transfer to another Facility (non-emergent condition) ?[]  - 0 ?Routine Hospital Admission (non-emergent condition) ?[]  - 0 ?New Admissions / / Ordering NPWT, Apligraf, etc. ?[]  - 0 ?Emergency Hospital Admission (emergent condition) ?X- 1 10 ?Simple Discharge Coordination ?[]  - 0 ?Complex (extensive) Discharge Coordination ?PROCESS - Special Needs ?[]  - Pediatric / Minor Patient Management 0 ?[]  - 0 ?Isolation Patient Management ?[]  - 0 ?Hearing / Language / Visual special needs ?[]  - 0 ?Assessment of Community assistance (transportation, D/C planning, etc.) ?[]  - 0 ?Additional assistance /  Altered mentation ?[]  - 0 ?Support Surface(s) Assessment (bed, cushion, seat, etc.) ?INTERVENTIONS - Wound Cleansing / Measurement ?Shimko, Karliah C. ( ) ?X- 1 5 ?Simple Wound Cleansing - one  wound ?[]  - 0 ?Complex Wound Cleansing - multiple wounds ?[]  - 0 ?Wound Imaging (photographs - any number of wounds) ?[]  - 0 ?Wound Tracing (instead of photographs) ?X- 1 5 ?Simple Wound Measurement - one wound ?[]  - 0 ?Complex Wound Measurement - multiple wounds ?INTERVENTIONS - Wound Dressings ?X - Small Wound Dressing one or multiple wounds 1 10 ?[]  - 0 ?Medium Wound Dressing one or multiple wounds ?[]  - 0 ?Large Wound Dressing one or multiple wounds ?[]  - 0 ?Application of Medications - topical ?[]  - 0 ?Application of Medications - injection ?INTERVENTIONS - Miscellaneous ?[]  - External ear exam 0 ?[]  - 0 ?Specimen Collection (cultures, biopsies, blood, body fluids, etc.) ?[]  - 0 ?Specimen(s) / Culture(s) sent or taken to Lab for analysis ?[]  - 0 ?Patient Transfer (multiple staff / / Similar devices) ?[]  - 0 ?Simple Staple / Suture removal (25 or less) ?[]  - 0 ?Complex Staple / Suture removal (26 or more) ?[]  - 0 ?Hypo / Hyperglycemic Management (close monitor of Blood Glucose) ?[]  - 0 ?Ankle / Brachial Index (ABI) - do not check if billed separately ?[]  - 0 ?Vital Signs ?Has the patient been seen at the hospital within the last three years: Yes ?Total Score: 60 ?Level Of Care: New/Established - ?Level 2 ?Electronic Signature(s) ?Signed: 03/12/2022 2:42:45 PM By: RN ?Entered By: on 03/12/2022 11:25:34 ?Karam, Lasonya C. ( ) ?-------------------------------------------------------------------------------- ?Encounter Discharge Information Details ?Patient Name: [name] ?Date of Service: 03/12/2022 11:00 AM ?Medical Record Number: ?Patient Account Number: ?Date of Birth/Sex: 1961-07-15 (61 y.o. F) ?Treating RN: Nurse, adult ?Primary Care  Quanika Solem: Card Other Clinician: ?Referring Sharell Hilmer: Card, John ?Treating Treven Holtman/Extender: ?Weeks in Treatment: 21 ?Encounter Discharge Information Items ?Discharge Condition: Stable ?Ambulatory Status: ?Discharge Destination: Home ?Transportation: Private Auto ?Accompanied By: self ?Schedule Follow-up Appointment: Yes ?Clinical Summary of Care: Patient Declined ?Electronic Signature(s) ?Signed: 03/12/2022 11:24:51 AM By: RN ?Entered By: 03/14/2022 on 03/12/2022 11:24:50 ?Pilz, Claudie C. (Yevonne Pax) ?-------------------------------------------------------------------------------- ?Wound Assessment Details ?Patient Name: CALLAN, YONTZ ?Date of Service: 03/12/2022 11:00 AM ?Medical Record Number: Lowella Grip ?Patient Account Number: 03/14/2022 ?Date of Birth/Sex: 1961/04/20 (61 y.o. F) ?Treating RN: 09/11/1961 ?Primary Care Waylin Dorko: Card(67 Other Clinician: ?Referring Brienna Bass: Card, John ?Treating [place] Yearby/Extender: Yevonne Pax ?Weeks in Treatment: 21 ?Wound Status ?Wound Number: 1 ?Primary Etiology: Dehisced Wound ?Wound Location: Distal, Midline Back ?Wound Status: Open ?Wounding Event: Surgical Injury ?Comorbid History: Hypertension ?Date Acquired: 04/11/2021 ?Weeks Of Treatment: 21 ?Clustered Wound: No ?Wound Measurements ?Length: (cm) 0.5 ?Width: (cm) 0.4 ?Depth: (cm) 2.8 ?Area: (cm?) 0.157 ?Volume: (cm?) 0.44 ?% Reduction in Area: -24.6% ?% Reduction in Volume: 23.9% ?Epithelialization: None ?Tunneling: No ?Undermining: No ?Wound Description ?Classification: Full Thickness Without Exposed Support Structu ?Exudate Amount: Medium ?Exudate Type: Serous ?Exudate Color: amber ?res Foul Odor After Cleansing: No ?Slough/Fibrino No ?Wound Bed ?Granulation Amount: Large (67-100%) Exposed Structure ?Granulation Quality: Red ?Fascia Exposed: No ?Necrotic Amount: None Present (0%) ?Fat Layer (Subcutaneous Tissue) Exposed: Yes ?Tendon Exposed: No ?Muscle Exposed:  No ?Joint Exposed: No ?Bone Exposed: No ?Treatment Notes ?Wound #1 (Back) Wound Laterality: Midline, Distal ?Cleanser ?Normal Saline ?Discharge Instruction: Wash your hands with soap and water. Remove old dress

## 2022-03-12 NOTE — Progress Notes (Signed)
Postma, Konstance C. ([id number]) ?Visit Report for 03/12/2022 ?Physician Orders Details ?Patient Name: [name], [name] ?Date of Service: 03/12/2022 11:00 AM ?Medical Record Number: [id number] ?Patient Account Number: [id number] ?Date of Birth/Sex: 12/15/1961 (61 y.o. F) ?Treating RN: Yevonne Pax ?Primary Care Provider: CardJonny Ruiz Other Clinician: ?Referring Provider: Card, John ?Treating Provider/Extender: Geralyn Corwin ?Weeks in Treatment: 21 ?Verbal / Phone Orders: No ?Diagnosis Coding ?Follow-up Appointments ?o Return Appointment in 1 week. ?o Nurse Visit as needed - nurse visit on tuesday and thursday ?Home Health ?o Home Health Company: - [facility] ?o CONTINUE Home Health for wound care. May utilize formulary equivalent dressing for wound treatment orders unless ?otherwise specified. Home Health Nurse may visit PRN to address patientos wound care needs. - 3 times per week ?[facility] fax [phone] ?Bathing/ Shower/ Hygiene ?o May shower; gently cleanse wound with antibacterial soap, rinse and pat dry prior to dressing wounds ?o No tub bath. ?Anesthetic (Use 'Patient Medications' Section for Anesthetic Order Entry) ?o Lidocaine applied to wound bed ?Edema Control - Lymphedema / Segmental Compressive Device / Other ?o Elevate, Exercise Daily and Avoid Standing for Long Periods of Time. ?o Elevate legs to the level of the heart and pump ankles as often as possible ?o Elevate leg(s) parallel to the floor when sitting. ?Additional Orders / Instructions ?o Follow Nutritious Diet and Increase Protein Intake ?Wound Treatment ?Wound #1 - Back Wound Laterality: Midline, Distal ?Cleanser: Normal Saline 1 x Per Day/30 Days ?Discharge Instructions: Wash your hands with soap and water. Remove old dressing, discard into plastic bag and place into trash. ?Cleanse the wound with Normal Saline prior to applying a clean dressing using gauze sponges, not tissues or cotton balls. Do not scrub ?or use excessive  force. Pat dry using gauze sponges, not tissue or cotton balls. ?Primary Dressing: Hydrofera Blue Classic Foam Rope Dressing, 9x6 (mm/in) 1 x Per Day/30 Days ?Discharge Instructions: cut rope in 1/2 lengthwise AND LEAVE A TAIL OUT TO GRAB IT ?Secondary Dressing: (SILICONE BORDER) Zetuvit Plus SILICONE BORDER Dressing 4x4 (in/in) 1 x Per Day/30 Days ?Electronic Signature(s) ?Signed: 03/12/2022 11:24:11 AM By: Yevonne Pax RN ?Signed: 03/12/2022 11:38:52 AM By: Geralyn Corwin DO ?Entered By: Yevonne Pax on 03/12/2022 11:24:11 ?John, Cana C. ([id number]) ?-------------------------------------------------------------------------------- ?SuperBill Details ?Patient Name: [name], [name] ?Date of Service: 03/12/2022 ?Medical Record Number: [id number] ?Patient Account Number: [id number] ?Date of Birth/Sex: September 04, 1961 (61 y.o. 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

## 2022-03-12 NOTE — Patient Instructions (Signed)

## 2022-03-12 NOTE — Therapy (Signed)
[place] [facility] ?[address] ?[place], [place], [address] ?Phone: [phone]   Fax:  [phone] ? ?Occupational Therapy Treatment ? ?Patient Details  ?Name: [name] ?MRN: [id number] ?Date of Birth: Jun 18, 1961 ?Referring Provider (OT): Crystal Arringtonm FNP ? ? ?Encounter Date: 03/12/2022 ? ? OT End of Session - 03/12/22 1316   ? ? Visit Number 4   ? Number of Visits 36   ? Date for OT Re-Evaluation 05/15/22   ? OT Start Time 0100   ? OT Stop Time 0215   ? OT Time Calculation (min) 75 min   ? Activity Tolerance Patient tolerated treatment well;No increased pain   ? Behavior During Therapy [facility] for tasks assessed/performed   ? ?  ?  ? ?  ? ? ?Past Medical History:  ?Diagnosis Date  ? Arthritis   ? Back pain   ? Chronic knee pain   ? Edema of both lower extremities   ? High blood pressure   ? High cholesterol   ? Joint pain   ? Multiple sclerosis (HCC)   ? Neuromuscular disorder (HCC)   ? Multiple Sclerosis over 20 years  ? Obesity   ? Vitamin D deficiency   ? ? ?Past Surgical History:  ?Procedure Laterality Date  ? APPENDECTOMY    ? CESAREAN SECTION    ? HAND SURGERY    ? lumbar back surgery    ? LUMBAR WOUND DEBRIDEMENT N/A 12/05/2020  ? Procedure: Lumbar Wound Exploration;  Surgeon: Coletta Memos, MD;  Location: [facility] OR;  Service: Neurosurgery;  Laterality: N/A;  3C/RM 21  ? LUMBAR WOUND DEBRIDEMENT N/A 02/21/2021  ? Procedure: Lumbar wound exploration;  Surgeon: Coletta Memos, MD;  Location: [facility] OR;  Service: Neurosurgery;  Laterality: N/A;  posterior  ? LUMBAR WOUND DEBRIDEMENT N/A 04/11/2021  ? Procedure: Wound Exploration with Wound Vac Placement;  Surgeon: Coletta Memos, MD;  Location: [facility] OR;  Service: Neurosurgery;  Laterality: N/A;  ? ROTATOR CUFF REPAIR    ? TONSILLECTOMY    ? ? ?There were no vitals filed for this visit. ? ? Subjective Assessment - 03/12/22 1318   ? ? Subjective  [name] returns to OT for treatment visit 4/36 to address BLE  lymphedema. Pt denies LE related pain in her legs this afternoon, but endorses OA knee pain and rates it as unchanged from last visit. Pt presents with multilayer wraps in place. She reports she applied wraps 2 x during visit interval.   ? Pertinent History B knee OA, HTN, Obesity, MS, Spondylolisthesis-lumbar, open wound at lumbar surgical site since 4/22.   ? Limitations difficulty walking, impaired transfers, chronic OA pain in bilateral knees, non healing post surgical lumbar wound, BLE muscle weakness 2/2 MS, chronic leg swelling with minimal pain,   ? Special Tests - Stemmer bilaterally; Intake FOTO score =31/100   ? Patient Stated Goals get my legs better so I can do more   ? Pain Onset Other (comment)   6 mnths ago without precipitating event. No family hx  ? Pain Onset Other (comment)   years  ? ?  ?  ? ?  ? ? ? ? ? ? ? ? ? ? ? ? ? ? ? OT Treatments/Exercises (OP) - 03/12/22 1442   ? ?  ? ADLs  ? ADL Education Given Yes   ?  ? Manual Therapy  ? Manual Therapy Edema management;Manual Lymphatic Drainage (MLD);Compression Bandaging   ? Manual Lymphatic  Drainage (MLD) MLD to RLE/RLQ utilizing diaphragmatic breathing to access deep abdominal pathways, short neck sequence and inguinal LN, then proovided MLD from proximal to distal addressing thigh, leg and foot. Foinished up session with short neck x 3 and deep breathing.   ? Compression Bandaging LLE multilayer compression wrap from base of toes to popliteal fossa using 1 ea 8, 10 and 12 cm wide short stretch bandage over cotton stockinette and 0.4 cm thick Rosidal foam.   ? ?  ?  ? ?  ? ? ? ? ? ? ? ? ? OT Education - 03/12/22 1444   ? ? Education Details Pt edu on simple self MLD, and lymphatic structure and function inrelative to MLD. good return   ? Person(s) Educated Patient   ? Methods Explanation;Demonstration;Handout;Verbal cues;Tactile cues   ? Comprehension Verbalized understanding;Returned demonstration;Need further instruction   ? ?  ?  ? ?   ? ? ? ? ? ? OT Long Term Goals - 02/15/22 1210   ? ?  ? OT LONG TERM GOAL #1  ? Title Given this patient?s Intake score 31 /100 on the functional outcomes FOTO tool, patient will experience an increase in function of 5 points to improve basic and instrumental ADLs performance, including lymphedema self-care.   ? Baseline 31/100   ? Period Weeks   ? Status New   ? Target Date 05/16/22   ?  ? OT LONG TERM GOAL #2  ? Title Pt will demonstrate understanding of lymphedema prevention strategies by identifying and discussing 5 precautions using printed reference (modified assistance) to reduce risk of progression and to limit infection risk.   ? Baseline Max A   ? Time 4   ? Period Days   ? Status New   ? Target Date --   4th OT Rx visit  ?  ? OT LONG TERM GOAL #3  ? Title With Maximum caregiver assistance Pt will be able to apply multilayer, LUE compression wraps using gradient techniques to decrease limb volume, to limit infection risk, and to limit lymphedema progression. Pt unable to wrap legs independently. Trained caregiver must assist during visit intervals for CDT to be effective.   ? Baseline dependent - Max caregiver assistance is necessary to complete Intensive Phase of Complete Decongestive Therapy and Lymphedema self-care home program  as Pt is unable to reach his feet to apply compression wraps and garments, to inspect skin, to groom nails to perform skin care and inspection and to perform simple self-MLD. Pt agrees to arrange for consistent caregiver prior to commencing CDT.   ? Time 4   ? Period Days   ? Status New   ? Target Date --   4th OT Rx visit  ?  ? OT LONG TERM GOAL #4  ? Title With Maximum caregiver assistance between OT sessions Pt will achieve at least a 10% limb volume reduction below the knee bilaterally to return limb to more normal size and shape, to limit infection risk, to decrease pain, to improve function, and to limit lymphedema progression.   ? Baseline dependent   ? Time 12   ?  Period Weeks   ? Status New   ? Target Date 05/16/22   ?  ? OT LONG TERM GOAL #5  ? Title With caregiver assistance  Pt will achieve and sustain a least 85% compliance with all 4 LE self-care home program components throughout Intensive Phase CDT, including modified simple self-MLD, daily skin inspection and care, lymphatic  pumping the ex, 23/7 compression wraps to optimize limb volume reductions, to limit lymphedema progression and to limit further functional decline.   ? Baseline Dependent   ? Time 12   ? Period Weeks   ? Target Date 05/16/22   ? ?  ?  ? ?  ? ? ? ? ? ? ? ? Plan - 03/12/22 1437   ? ? Clinical Impression Statement Pt applied knee length, RLE, multilayer compression wraps by herself between visits. Although wraps not applied perfectly she did achieve a notable reduction in fluid volume in her leg. Pt was attentive today when we reapplied and retaught wrapping after bandage application. Much teaching for simple self MLD done today during initial manual therapy session.Pt able to perform the "J Stroke" with modd assist, including verbal and tactile cues, by end of session. Cont as per POC.   ? OT Occupational Profile and History Detailed Assessment- Review of Records and additional review of physical, cognitive, psychosocial history related to current functional performance   ? Occupational performance deficits (Please refer to evaluation for details): ADL's;IADL's;Rest and Sleep;Leisure;Social Participation;Work   ? Rehab Potential Fair   ? Clinical Decision Making Several treatment options, min-mod task modification necessary   ? Comorbidities Affecting Occupational Performance: May have comorbidities impacting occupational performance   ? Modification or Assistance to Complete Evaluation  Min-Moderate modification of tasks or assist with assess necessary to complete eval   ? OT Frequency 2x / week   ? OT Duration 12 weeks   ? OT Treatment/Interventions Self-care/ADL training;DME and/or AE  instruction;Manual lymph drainage;Compression bandaging;Therapeutic activities;Coping strategies training;Therapeutic exercise;Other (comment);Manual Therapy;Energy conservation;Patient/family education   skin care, Flexitou

## 2022-03-13 ENCOUNTER — Ambulatory Visit: Payer: 59 | Admitting: Occupational Therapy

## 2022-03-14 ENCOUNTER — Encounter: Payer: 59 | Admitting: Physician Assistant

## 2022-03-14 ENCOUNTER — Other Ambulatory Visit: Payer: Self-pay

## 2022-03-14 DIAGNOSIS — T8131XA Disruption of external operation (surgical) wound, not elsewhere classified, initial encounter: Secondary | ICD-10-CM | POA: Diagnosis not present

## 2022-03-14 NOTE — Progress Notes (Addendum)
[name], [name] ([id number]) ?Visit Report for 03/14/2022 ?Chief Complaint Document Details ?Patient Name: [name], [name] ?Date of Service: 03/14/2022 3:30 PM ?Medical Record Number: [id number] ?Patient Account Number: [id number] ?Date of Birth/Sex: 1961/06/24 (61 y.o. F) ?Treating RN: Yevonne Pax ?Primary Care Provider: CardJonny Ruiz Other Clinician: ?Referring Provider: Card, John ?Treating Provider/Extender: Allen Derry ?Weeks in Treatment: 21 ?Information Obtained from: Patient ?Chief Complaint ?Surgical Back Ulcer ?Electronic Signature(s) ?Signed: 03/14/2022 4:12:17 PM By: [name] PA-C ?Entered By: [name] on 03/14/2022 16:12:17 ?[name], [name] ([id number]) ?-------------------------------------------------------------------------------- ?HPI Details ?Patient Name: [name], [name] ?Date of Service: 03/14/2022 3:30 PM ?Medical Record Number: [id number] ?Patient Account Number: [id number] ?Date of Birth/Sex: 09/26/1961 (61 y.o. F) ?Treating RN: Yevonne Pax ?Primary Care Provider: CardJonny Ruiz Other Clinician: ?Referring Provider: Card, John ?Treating Provider/Extender: Allen Derry ?Weeks in Treatment: 21 ?History of Present Illness ?HPI Description: 10/15/2021 upon evaluation today patient presents for initial evaluation here in the clinic concerning a surgical ?ulceration/dehiscence in the lumbar spine region following surgery that she had over the past year. This was actually broken up into 3 separate ?surgical events. The initial surgical intervention actually was on November 05, 2021 almost a year ago. Subsequently the patient went back in ?February for a seroma of the area which unfortunately required her to have a repeat surgery to go in and clean this out. And then again this ?occurred in April where she went back in and again they felt like stitches were coming out and there was an additional seroma. She was placed in ?a wound VAC initially and then subsequently as it got smaller that  was discontinued. Again right now I will see anything that I think a wound VAC ?would help with. Nonetheless she definitely has a significant depth to the wound that is going require packing. I actually believe the Hydrofera ?Blue rope would probably do quite well with this the problem is as much as it is draining she probably needs this to be changed at least every day. ?She does not really have anyone that can help with that that is the complicating scenario here. With that being said the patient does have a ?history of multiple sclerosis, hypertension, and again this surgical wound dehiscence in regard to her lumbar spine region. She did have a repeat ?MRI which was actually completed 10/09/2021. This showed that there was no significant change in the subcutaneous fluid collection/track of the ?lower lumbar region. This is extending to the level of the fascia unfortunately. This seems to go all the way from the L2 level with a track ?extending all the way to the fascia at the L4-5 level. Again this is a significant wound and there is significant drainage but does not seem to ?communicate to the spinal region as far as spinal fluid or otherwise is concerned that is good news. Nonetheless she last saw Dr. Franky Macho who is ?her neurosurgeon on 10/01/2021 that was when he ordered this last MRI she supposed to see him next week as well. With that being said he did ?not feel like there was any significant issue there but was not sure why this was not healing that is when he ordered the MRI. They were wanting ?to make sure that this was packed appropriately by home health unfortunately the main issue currently is that home health is completely out of ?the picture as the patient has exhausted all the home health that that she gets for a year. She is now in a very difficult  predicament where she ?does not have anyone to help her change the dressing and to be honest that she is not able to do it herself with the location of  the wound being ?on the midline lumbar spine region. If she does not have anyone that can help it is probably can to be necessary for her to go to a facility for ?rehab and daily dressing changes as I feel like daily changes which is much drainage that she is having is going to be necessary. ?10/23/2021 upon evaluation today patient appears to be doing decently well in regard to her back ulcer. This does seem to be draining a lot less ?than what it is been doing in the past. With that being said she still has quite a bit of drainage nonetheless. I do think that given time this should ?improve least I hope so. The good news is she does have home health coming out 3 days a week were doing it 2 days a week and she is paying ?someone we can to help. ?10/30/2021 upon evaluation today patient appears to be doing okay in regard to her back ulcer this is not draining quite as bad as it was in the ?beginning but he still has quite a bit of drainage noted. I do believe that the patient would benefit from [place] going ahead forward with attempting a ?wound VAC using the Hydrofera Blue rope to pack with and then subsequently using the VAC externally to actually suction out and help this to fill- ?in. I think this is our ideal way to try to get things cleared at this point. As it stands I am not certain that we are really making a progress that we ?want to see near with doing it in the way we are which is packing with the rope. It is a good dressing but I do think it is insufficient for total ?healing. She just seems to have too much in the way of drainage at this point unfortunately. ?11/06/2021 upon evaluation today patient unfortunately continues to have issues with her back ongoing. The good news is her MRI that was ?repeated showed signs of the size of this area in the lumbar spine region having decreased from 4 cm to 3.5 cm this is definitely not bad news at ?all. With that being said unfortunately she continues to have issues  with ongoing drainage not as severe as in the beginning but nonetheless still ?significant. I do think a wound VAC still would be a good way to go although her home health agency nurse apparently has some concerns about ?the possibility of not being able to keep a seal with this as they had struggles in the past. Nonetheless I explained to the patient that this is much ?different than what she had previous and that I really feel like it would do much better as far as getting the area taken care of without having any ?complications or issues here. I think that we should be able to maintain a seal. Nonetheless at this time I did discuss with the patient as well that ?she probably does need to have a wound VAC in order for [place] to get this moving in the right direction. ?11/13/2021 upon evaluation patient's wound bed actually showed signs of significant drainage at this time. She did see the surgeon yesterday he ?did not see anything that appeared to be infected. Nonetheless he does appear that she is continuing to have areas here that just do not seem  to ?want to seal up there MRI findings have been negative but nonetheless she continues to have is the seroma that is filling in. I do feel like we need ?to try to widen the hole so we can get at least a half of the [facility] then this will be better than nothing at this point. ?11/20/2021 upon evaluation today patient actually appears to be having less pain at this point which is good news and overall she we still do not ?have the results of the culture back yet it had to be sent out to [facility] and we do not have the result back yet. Is doing decently well in regard to ?her wound. Fortunately there does not appear to be any signs of active infection systemically nor locally at this time. ?11/27/2021 upon evaluation today patient appears to be doing well with regard to her wound all things considered there does appear to be less ?drainage than there was previous.  Fortunately I do not see any evidence of worsening of the patient is stating that she is having some issues with ?back pain. This is somewhat new. Again this I think could be related to the fact that she is ha

## 2022-03-14 NOTE — Progress Notes (Addendum)
Pavlock, [name] ([id number]) ?Visit Report for 03/14/2022 ?Arrival Information Details ?Patient Name: [name], [name] ?Date of Service: 03/14/2022 3:30 PM ?Medical Record Number: [id number] ?Patient Account Number: [id number] ?Date of Birth/Sex: 1961-04-05 (61 y.o. F) ?Treating RN: Yevonne Pax ?Primary Care Adelbert Gaspard: CardJonny Ruiz Other Clinician: ?Referring Mekayla Soman: Card, John ?Treating Annalyse Langlais/Extender: Allen Derry ?Weeks in Treatment: 21 ?Visit Information History Since Last Visit ?All ordered tests and consults were completed: No ?Patient Arrived: Ambulatory ?Added or deleted any medications: No ?Arrival Time: 15:55 ?Any new allergies or adverse reactions: No ?Accompanied By: self ?Had a fall or experienced change in No ?Transfer Assistance: None ?activities of daily living that may affect ?Patient Identification Verified: Yes ?risk of falls: ?Secondary Verification Process Completed: Yes ?Signs or symptoms of abuse/neglect since last visito No ?Patient Requires Transmission-Based Precautions: No ?Hospitalized since last visit: No ?Patient Has Alerts: No ?Implantable device outside of the clinic excluding No ?cellular tissue based products placed in the center ?since last visit: ?Has Dressing in Place as Prescribed: Yes ?Pain Present Now: Yes ?Electronic Signature(s) ?Signed: 03/14/2022 4:23:41 PM By: Yevonne Pax RN ?Entered By: Yevonne Pax on 03/14/2022 16:23:41 ?Hughley, Ellar C. ([id number]) ?-------------------------------------------------------------------------------- ?Clinic Level of Care Assessment Details ?Patient Name: [name], [name] ?Date of Service: 03/14/2022 3:30 PM ?Medical Record Number: [id number] ?Patient Account Number: [id number] ?Date of Birth/Sex: 10/25/1961 (61 y.o. F) ?Treating RN: Yevonne Pax ?Primary Care Taneah Masri: CardJonny Ruiz Other Clinician: ?Referring Marsella Suman: Card, John ?Treating Nycole Kawahara/Extender: Allen Derry ?Weeks in Treatment: 21 ?Clinic Level of Care Assessment  Items ?TOOL 4 Quantity Score ?X - Use when only an EandM is performed on FOLLOW-UP visit 1 0 ?ASSESSMENTS - Nursing Assessment / Reassessment ?X - Reassessment of Co-morbidities (includes updates in patient status) 1 10 ?X- 1 5 ?Reassessment of Adherence to Treatment Plan ?ASSESSMENTS - Wound and Skin Assessment / Reassessment ?X - Simple Wound Assessment / Reassessment - one wound 1 5 ?[]  - 0 ?Complex Wound Assessment / Reassessment - multiple wounds ?[]  - 0 ?Dermatologic / Skin Assessment (not related to wound area) ?ASSESSMENTS - Focused Assessment ?[]  - Circumferential Edema Measurements - multi extremities 0 ?[]  - 0 ?Nutritional Assessment / Counseling / Intervention ?[]  - 0 ?Lower Extremity Assessment (monofilament, tuning fork, pulses) ?[]  - 0 ?Peripheral Arterial Disease Assessment (using hand held doppler) ?ASSESSMENTS - Ostomy and/or Continence Assessment and Care ?[]  - Incontinence Assessment and Management 0 ?[]  - 0 ?Ostomy Care Assessment and Management (repouching, etc.) ?PROCESS - Coordination of Care ?X - Simple Patient / Family Education for ongoing care 1 15 ?[]  - 0 ?Complex (extensive) Patient / Family Education for ongoing care ?X- 1 10 ?Staff obtains Consents, Records, Test Results / Process Orders ?[]  - 0 ?Staff telephones HHA, Nursing Homes / Clarify orders / etc ?[]  - 0 ?Routine Transfer to another Facility (non-emergent condition) ?[]  - 0 ?Routine Hospital Admission (non-emergent condition) ?[]  - 0 ?New Admissions / / Ordering NPWT, Apligraf, etc. ?[]  - 0 ?Emergency Hospital Admission (emergent condition) ?X- 1 10 ?Simple Discharge Coordination ?[]  - 0 ?Complex (extensive) Discharge Coordination ?PROCESS - Special Needs ?[]  - Pediatric / Minor Patient Management 0 ?[]  - 0 ?Isolation Patient Management ?[]  - 0 ?Hearing / Language / Visual special needs ?[]  - 0 ?Assessment of Community assistance (transportation, D/C planning, etc.) ?[]  - 0 ?Additional assistance /  Altered mentation ?[]  - 0 ?Support Surface(s) Assessment (bed, cushion, seat, etc.) ?INTERVENTIONS - Wound Cleansing / Measurement ?[name], [name] ( ) ?X- 1 5 ?Simple Wound Cleansing -  one wound ?[]  - 0 ?Complex Wound Cleansing - multiple wounds ?[]  - 0 ?Wound Imaging (photographs - any number of wounds) ?[]  - 0 ?Wound Tracing (instead of photographs) ?X- 1 5 ?Simple Wound Measurement - one wound ?[]  - 0 ?Complex Wound Measurement - multiple wounds ?INTERVENTIONS - Wound Dressings ?[]  - Small Wound Dressing one or multiple wounds 0 ?X- 1 15 ?Medium Wound Dressing one or multiple wounds ?[]  - 0 ?Large Wound Dressing one or multiple wounds ?[]  - 0 ?Application of Medications - topical ?[]  - 0 ?Application of Medications - injection ?INTERVENTIONS - Miscellaneous ?[]  - External ear exam 0 ?[]  - 0 ?Specimen Collection (cultures, biopsies, blood, body fluids, etc.) ?[]  - 0 ?Specimen(s) / Culture(s) sent or taken to Lab for analysis ?[]  - 0 ?Patient Transfer (multiple staff / / Similar devices) ?[]  - 0 ?Simple Staple / Suture removal (25 or less) ?[]  - 0 ?Complex Staple / Suture removal (26 or more) ?[]  - 0 ?Hypo / Hyperglycemic Management (close monitor of Blood Glucose) ?[]  - 0 ?Ankle / Brachial Index (ABI) - do not check if billed separately ?X- 1 5 ?Vital Signs ?Has the patient been seen at the hospital within the last three years: Yes ?Total Score: 85 ?Level Of Care: New/Established - Level ?3 ?Electronic Signature(s) ?Signed: 03/15/2022 4:20:50 PM By: RN ?Entered By: on 03/14/2022 16:28:06 ?[name], [name] ( ) ?-------------------------------------------------------------------------------- ?Encounter Discharge Information Details ?Patient Name: [name], [name] ?Date of Service: 03/14/2022 3:30 PM ?Medical Record Number: ?Patient Account Number: ?Date of Birth/Sex: Jan 18, 1961 (61 y.o. F) ?Treating RN: Nurse, adult ?Primary Care  Ebrima Ranta: Card Other Clinician: ?Referring Mico Spark: Card, John ?Treating Sherra Kimmons/Extender: ?Weeks in Treatment: 21 ?Encounter Discharge Information Items ?Discharge Condition: Stable ?Ambulatory Status: ?Discharge Destination: Home ?Transportation: Private Auto ?Accompanied By: self ?Schedule Follow-up Appointment: Yes ?Clinical Summary of Care: Patient Declined ?Electronic Signature(s) ?Signed: 03/14/2022 4:30:17 PM By: 03/17/2022 RN ?Entered By: Yevonne Pax on 03/14/2022 16:30:17 ?[name], [name] (03/16/2022) ?-------------------------------------------------------------------------------- ?Lower Extremity Assessment Details ?Patient Name: [name], [name] ?Date of Service: 03/14/2022 3:30 PM ?Medical Record Number: 03/16/2022 ?Patient Account Number: [id number] ?Date of Birth/Sex: Oct 07, 1961 (61 y.o. F) ?Treating RN: (67 ?Primary Care Darold Miley: CardYevonne Pax Other Clinician: ?Referring Onaje Warne: Card, John ?Treating Shaunna Rosetti/Extender: Jonny Ruiz ?Weeks in Treatment: 21 ?Electronic Signature(s) ?Signed: 03/14/2022 4:25:25 PM By: [name] RN ?Entered By: 03/16/2022 on 03/14/2022 16:25:25 ?[name], [name] (Yevonne Pax) ?-------------------------------------------------------------------------------- ?Multi Wound Chart Details ?Patient Name: [name], [name] ?Date of Service: 03/14/2022 3:30 PM ?Medical Record Number: Lowella Grip ?Patient Account Number: 03/16/2022 ?Date of Birth/Sex: Jan 15, 1961 (61 y.o. F) ?Treating RN: 09/11/1961 ?Primary Care Marisabel Macpherson: Card(67 Other Clinician: ?Referring Shyrl Obi: Card, John ?Treating Aldon Hengst/Extender: Yevonne Pax ?Weeks in Treatment: 21 ?Photos: [1:No Photos] [N/A:N/A] ?Wound Location: [1:Distal, Midline Back] [N/A:N/A] ?Wounding Event: [1:Surgical Injury] [N/A:N/A] ?Primary Etiology: [1:Dehisced Wound] [N/A:N/A] ?Comorbid History: [1:Hypertension] [N/A:N/A] ?Date Acquired: [1:04/11/2021] [N/A:N/A] ?Weeks of Treatment: [1:21]  [N/A:N/A] ?Wound Status: [1:Open] [N/A:N/A] ?Wound Recurrence: [1:No] [N/A:N/A] ?Measurements L x W x D (cm) [1:0.5x0.4x2.8] [N/A:N/A] ?Area (cm?) : [1:0.157] [N/A:N/A] ?Volume (cm?) : [1:0.44] [N/A:N/A] ?% Reduction in Area

## 2022-03-15 ENCOUNTER — Other Ambulatory Visit
Admission: RE | Admit: 2022-03-15 | Discharge: 2022-03-15 | Disposition: A | Discharge status: Home / Self Care | Payer: 59 | Source: Ambulatory Visit | Attending: Physician Assistant | Admitting: Physician Assistant

## 2022-03-15 ENCOUNTER — Ambulatory Visit: Payer: 59 | Admitting: Occupational Therapy

## 2022-03-15 DIAGNOSIS — B998 Other infectious disease: Secondary | ICD-10-CM | POA: Diagnosis present

## 2022-03-15 DIAGNOSIS — I890 Lymphedema, not elsewhere classified: Secondary | ICD-10-CM

## 2022-03-15 NOTE — Patient Instructions (Signed)

## 2022-03-15 NOTE — Therapy (Signed)
[facility] ?[place], [name], [address] ?Phone: [phone]   Fax:  [phone] ? ?Occupational Therapy Treatment ? ?Patient Details  ?Name: [name] ?MRN: [id number] ?Date of Birth: [birth year] ?Referring Provider (OT): Crystal Arringtonm FNP ? ? ?Encounter Date: 03/15/2022 ? ? OT End of Session - 03/15/22 1107   ? ? Visit Number 5   ? Number of Visits 36   ? Date for OT Re-Evaluation 05/15/22   ? OT Start Time 1108   ? OT Stop Time 1209   ? OT Time Calculation (min) 61 min   ? Activity Tolerance Patient tolerated treatment well;No increased pain   ? Behavior During Therapy [facility] for tasks assessed/performed   ? ?  ?  ? ?  ? ? ?Past Medical History:  ?Diagnosis Date  ? Arthritis   ? Back pain   ? Chronic knee pain   ? Edema of both lower extremities   ? High blood pressure   ? High cholesterol   ? Joint pain   ? Multiple sclerosis ([place])   ? Neuromuscular disorder ([place])   ? Multiple Sclerosis over 20 years  ? Obesity   ? Vitamin D deficiency   ? ? ?Past Surgical History:  ?Procedure Laterality Date  ? APPENDECTOMY    ? CESAREAN SECTION    ? HAND SURGERY    ? lumbar back surgery    ? LUMBAR WOUND DEBRIDEMENT N/A 12/05/2020  ? Procedure: Lumbar Wound Exploration;  Surgeon: Ashok Pall, MD;  Location: [place];  Service: Neurosurgery;  Laterality: N/A;  3C/RM 21  ? LUMBAR WOUND DEBRIDEMENT N/A 02/21/2021  ? Procedure: Lumbar wound exploration;  Surgeon: Ashok Pall, MD;  Location: [place];  Service: Neurosurgery;  Laterality: N/A;  posterior  ? LUMBAR WOUND DEBRIDEMENT N/A 04/11/2021  ? Procedure: Wound Exploration with Wound Vac Placement;  Surgeon: Ashok Pall, MD;  Location: [place];  Service: Neurosurgery;  Laterality: N/A;  ? ROTATOR CUFF REPAIR    ? TONSILLECTOMY    ? ? ?There were no vitals filed for this visit. ? ? Subjective Assessment - 03/15/22 1120   ? ? Subjective  [name] returns to OT for treatment visit 5/36 to address BLE  lymphedema. Pt denies LE related pain in her legs. Pt reports she wrapped herself  twice during the interval. "The hardest part is doing my feet."   ? Pertinent History B knee OA, HTN, Obesity, MS, Spondylolisthesis-lumbar, open wound at lumbar surgical site since 4/22.   ? Limitations difficulty walking, impaired transfers, chronic OA pain in bilateral knees, non healing post surgical lumbar wound, BLE muscle weakness 2/2 MS, chronic leg swelling with minimal pain,   ? Special Tests - Stemmer bilaterally; Intake FOTO score =31/100   ? Patient Stated Goals get my legs better so I can do more   ? Currently in Pain? No/denies   ? Pain Onset Other (comment)   6 mnths ago without precipitating event. No family hx  ? Pain Onset Other (comment)   years  ? ?  ?  ? ?  ? ? ? ? ? ? ? ? ? ? ? ? ? ? ? OT Treatments/Exercises (OP) - 03/15/22 1122   ? ?  ? ADLs  ? ADL Education Given Yes   ?  ? Manual Therapy  ? Manual Therapy Edema management   ? Manual Lymphatic Drainage (MLD) MLD to RLE/RLQ utilizing diaphragmatic breathing to access deep abdominal  pathways, short neck sequence and inguinal LN, then proovided MLD from proximal to distal addressing thigh, leg and foot. Foinished up session with short neck x 3 and deep breathing.   ? Compression Bandaging LLE multilayer compression wrap from base of toes to popliteal fossa using 1 ea 8, 10 and 12 cm wide short stretch bandage over cotton stockinette and 0.4 cm thick Rosidal foam.   ? ?  ?  ? ?  ? ? ? ? ? ? ? ? ? OT Education - 03/15/22 1122   ? ? Education Details Continued skilled Pt/caregiver education  And LE ADL training throughout visit for lymphedema self care/ home program, including compression wrapping, compression garment and device wear/care, lymphatic pumping ther ex, simple self-MLD, and skin care. Discussed initial volumetric measurements. Discussed all long term goals.   ? Person(s) Educated Patient   ? Methods Explanation;Demonstration;Handout   ? Comprehension  Verbalized understanding;Returned demonstration;Need further instruction   ? ?  ?  ? ?  ? ? ? ? ? ? OT Long Term Goals - 02/15/22 1210   ? ?  ? OT LONG TERM GOAL #1  ? Title Given this patient?s Intake score 31 /100 on the functional outcomes FOTO tool, patient will experience an increase in function of 5 points to improve basic and instrumental ADLs performance, including lymphedema self-care.   ? Baseline 31/100   ? Period Weeks   ? Status New   ? Target Date 05/16/22   ?  ? OT LONG TERM GOAL #2  ? Title Pt will demonstrate understanding of lymphedema prevention strategies by identifying and discussing 5 precautions using printed reference (modified assistance) to reduce risk of progression and to limit infection risk.   ? Baseline Max A   ? Time 4   ? Period Days   ? Status New   ? Target Date --   4th OT Rx visit  ?  ? OT LONG TERM GOAL #3  ? Title With Maximum caregiver assistance Pt will be able to apply multilayer, LUE compression wraps using gradient techniques to decrease limb volume, to limit infection risk, and to limit lymphedema progression. Pt unable to wrap legs independently. Trained caregiver must assist during visit intervals for CDT to be effective.   ? Baseline dependent - Max caregiver assistance is necessary to complete Intensive Phase of Complete Decongestive Therapy and Lymphedema self-care home program  as Pt is unable to reach his feet to apply compression wraps and garments, to inspect skin, to groom nails to perform skin care and inspection and to perform simple self-MLD. Pt agrees to arrange for consistent caregiver prior to commencing CDT.   ? Time 4   ? Period Days   ? Status New   ? Target Date --   4th OT Rx visit  ?  ? OT LONG TERM GOAL #4  ? Title With Maximum caregiver assistance between OT sessions Pt will achieve at least a 10% limb volume reduction below the knee bilaterally to return limb to more normal size and shape, to limit infection risk, to decrease pain, to improve  function, and to limit lymphedema progression.   ? Baseline dependent   ? Time 12   ? Period Weeks   ? Status New   ? Target Date 05/16/22   ?  ? OT LONG TERM GOAL #5  ? Title With caregiver assistance  Pt will achieve and sustain a least 85% compliance with all 4 LE self-care home program components throughout  Intensive Phase CDT, including modified simple self-MLD, daily skin inspection and care, lymphatic pumping the ex, 23/7 compression wraps to optimize limb volume reductions, to limit lymphedema progression and to limit further functional decline.   ? Baseline Dependent   ? Time 12   ? Period Weeks   ? Target Date 05/16/22   ? ?  ?  ? ?  ? ? ? ? ? ? ? ? Plan - 03/15/22 1210   ? ? Clinical Impression Statement Pt mastered J stroke and short neck MLD sequence after skilled teaching. Pt tolerated MLD to RLE/RLQ without   increased pain. Also provided teaching re basic vs advanced sequetial "pumps" and cont instruction on sef bandaging. Productive session. Cont as per POC   ? OT Occupational Profile and History Detailed Assessment- Review of Records and additional review of physical, cognitive, psychosocial history related to current functional performance   ? Occupational performance deficits (Please refer to evaluation for details): ADL's;IADL's;Rest and Sleep;Leisure;Social Participation;Work   ? Rehab Potential Fair   ? Clinical Decision Making Several treatment options, min-mod task modification necessary   ? Comorbidities Affecting Occupational Performance: May have comorbidities impacting occupational performance   ? Modification or Assistance to Complete Evaluation  Min-Moderate modification of tasks or assist with assess necessary to complete eval   ? OT Frequency 2x / week   ? OT Duration 12 weeks   ? OT Treatment/Interventions Self-care/ADL training;DME and/or AE instruction;Manual lymph drainage;Compression bandaging;Therapeutic activities;Coping strategies training;Therapeutic exercise;Other  (comment);Manual Therapy;Energy conservation;Patient/family education   skin care, Flexitouch trial, fit with appropriate compression garments that are comfortable, effective and that Pt is able to don and doff [place]

## 2022-03-18 ENCOUNTER — Encounter: Payer: 59 | Admitting: Occupational Therapy

## 2022-03-18 LAB — AEROBIC CULTURE W GRAM STAIN (SUPERFICIAL SPECIMEN): Gram Stain: NONE SEEN

## 2022-03-19 ENCOUNTER — Other Ambulatory Visit: Payer: Self-pay

## 2022-03-19 ENCOUNTER — Encounter: Payer: 59 | Admitting: Physician Assistant

## 2022-03-19 ENCOUNTER — Ambulatory Visit: Payer: 59 | Admitting: Occupational Therapy

## 2022-03-19 DIAGNOSIS — I890 Lymphedema, not elsewhere classified: Secondary | ICD-10-CM

## 2022-03-19 DIAGNOSIS — T8131XA Disruption of external operation (surgical) wound, not elsewhere classified, initial encounter: Secondary | ICD-10-CM | POA: Diagnosis not present

## 2022-03-19 NOTE — Progress Notes (Signed)
[name], [name] ([id number]) ?Visit Report for 03/19/2022 ?Arrival Information Details ?Patient Name: [name], [name] ?Date of Service: 03/19/2022 11:00 AM ?Medical Record Number: [id number] ?Patient Account Number: [id number] ?Date of Birth/Sex: 01/13/1961 (61 y.o. F) ?Treating RN: Yevonne Pax ?Primary Care Caz Weaver: CardJonny Ruiz Other Clinician: ?Referring Jakyron Fabro: Card, John ?Treating Joselyn Edling/Extender: Allen Derry ?Weeks in Treatment: 22 ?Visit Information History Since Last Visit ?All ordered tests and consults were completed: No ?Patient Arrived: [name] ?Added or deleted any medications: No ?Arrival Time: 11:09 ?Any new allergies or adverse reactions: No ?Accompanied By: self ?Had a fall or experienced change in No ?Transfer Assistance: None ?activities of daily living that may affect ?Patient Identification Verified: Yes ?risk of falls: ?Secondary Verification Process Completed: Yes ?Signs or symptoms of abuse/neglect since last visito No ?Patient Requires Transmission-Based Precautions: No ?Hospitalized since last visit: No ?Patient Has Alerts: No ?Implantable device outside of the clinic excluding No ?cellular tissue based products placed in the center ?since last visit: ?Has Dressing in Place as Prescribed: Yes ?Has Compression in Place as Prescribed: Yes ?Pain Present Now: No ?Electronic Signature(s) ?Signed: 03/19/2022 4:35:36 PM By: Yevonne Pax RN ?Entered By: Yevonne Pax on 03/19/2022 11:09:30 ?[name], [name] ([id number]) ?-------------------------------------------------------------------------------- ?Clinic Level of Care Assessment Details ?Patient Name: [name] ?Date of Service: 03/19/2022 11:00 AM ?Medical Record Number: [id number] ?Patient Account Number: [id number] ?Date of Birth/Sex: [birth year] (61 y.o. F) ?Treating RN: Yevonne Pax ?Primary Care Kailyn Vanderslice: CardJonny Ruiz Other Clinician: ?Referring Elion Hocker: Card, John ?Treating Muriel Hannold/Extender: Allen Derry ?Weeks in Treatment:  22 ?Clinic Level of Care Assessment Items ?TOOL 4 Quantity Score ?X - Use when only an EandM is performed on FOLLOW-UP visit 1 0 ?ASSESSMENTS - Nursing Assessment / Reassessment ?X - Reassessment of Co-morbidities (includes updates in patient status) 1 10 ?X- 1 5 ?Reassessment of Adherence to Treatment Plan ?ASSESSMENTS - Wound and Skin Assessment / Reassessment ?X - Simple Wound Assessment / Reassessment - one wound 1 5 ?[]  - 0 ?Complex Wound Assessment / Reassessment - multiple wounds ?[]  - 0 ?Dermatologic / Skin Assessment (not related to wound area) ?ASSESSMENTS - Focused Assessment ?[]  - Circumferential Edema Measurements - multi extremities 0 ?[]  - 0 ?Nutritional Assessment / Counseling / Intervention ?[]  - 0 ?Lower Extremity Assessment (monofilament, tuning fork, pulses) ?[]  - 0 ?Peripheral Arterial Disease Assessment (using hand held doppler) ?ASSESSMENTS - Ostomy and/or Continence Assessment and Care ?[]  - Incontinence Assessment and Management 0 ?[]  - 0 ?Ostomy Care Assessment and Management (repouching, etc.) ?PROCESS - Coordination of Care ?X - Simple Patient / Family Education for ongoing care 1 15 ?[]  - 0 ?Complex (extensive) Patient / Family Education for ongoing care ?[]  - 0 ?Staff obtains Consents, Records, Test Results / Process Orders ?[]  - 0 ?Staff telephones HHA, Nursing Homes / Clarify orders / etc ?[]  - 0 ?Routine Transfer to another Facility (non-emergent condition) ?[]  - 0 ?Routine Hospital Admission (non-emergent condition) ?[]  - 0 ?New Admissions / / Ordering NPWT, Apligraf, etc. ?[]  - 0 ?Emergency Hospital Admission (emergent condition) ?X- 1 10 ?Simple Discharge Coordination ?[]  - 0 ?Complex (extensive) Discharge Coordination ?PROCESS - Special Needs ?[]  - Pediatric / Minor Patient Management 0 ?[]  - 0 ?Isolation Patient Management ?[]  - 0 ?Hearing / Language / Visual special needs ?[]  - 0 ?Assessment of Community assistance (transportation, D/C planning,  etc.) ?[]  - 0 ?Additional assistance / Altered mentation ?[]  - 0 ?Support Surface(s) Assessment (bed, cushion, seat, etc.) ?INTERVENTIONS - Wound Cleansing / Measurement ?[name], [name] ( ) ?  X- 1 5 ?Simple Wound Cleansing - one wound ?[]  - 0 ?Complex Wound Cleansing - multiple wounds ?X- 1 5 ?Wound Imaging (photographs - any number of wounds) ?[]  - 0 ?Wound Tracing (instead of photographs) ?X- 1 5 ?Simple Wound Measurement - one wound ?[]  - 0 ?Complex Wound Measurement - multiple wounds ?INTERVENTIONS - Wound Dressings ?[]  - Small Wound Dressing one or multiple wounds 0 ?X- 1 15 ?Medium Wound Dressing one or multiple wounds ?[]  - 0 ?Large Wound Dressing one or multiple wounds ?[]  - 0 ?Application of Medications - topical ?[]  - 0 ?Application of Medications - injection ?INTERVENTIONS - Miscellaneous ?[]  - External ear exam 0 ?[]  - 0 ?Specimen Collection (cultures, biopsies, blood, body fluids, etc.) ?[]  - 0 ?Specimen(s) / Culture(s) sent or taken to Lab for analysis ?[]  - 0 ?Patient Transfer (multiple staff / / Similar devices) ?[]  - 0 ?Simple Staple / Suture removal (25 or less) ?[]  - 0 ?Complex Staple / Suture removal (26 or more) ?[]  - 0 ?Hypo / Hyperglycemic Management (close monitor of Blood Glucose) ?[]  - 0 ?Ankle / Brachial Index (ABI) - do not check if billed separately ?X- 1 5 ?Vital Signs ?Has the patient been seen at the hospital within the last three years: Yes ?Total Score: 80 ?Level Of Care: New/Established - Level ?3 ?Electronic Signature(s) ?Signed: 03/19/2022 4:35:36 PM By: RN ?Entered By: on 03/19/2022 11:40:29 ?[name], [name] ( ) ?-------------------------------------------------------------------------------- ?Encounter Discharge Information Details ?Patient Name: [name], [name] ?Date of Service: 03/19/2022 11:00 AM ?Medical Record Number: ?Patient Account Number: ?Date of Birth/Sex: 08-29-1961 (61 y.o.  F) ?Treating RN: ?Primary Care Geanine Vandekamp: Card Other Clinician: ?Referring Zaleigh Bermingham: Card, John ?Treating Dmiyah Liscano/Extender: ?Weeks in Treatment: 22 ?Encounter Discharge Information Items ?Discharge Condition: Stable ?Ambulatory Status: ?Discharge Destination: Home ?Transportation: Private Auto ?Accompanied By: self ?Schedule Follow-up Appointment: Yes ?Clinical Summary of Care: Patient Declined ?Electronic Signature(s) ?Signed: 03/19/2022 4:35:36 PM By: Yevonne Pax RN ?Entered By: Yevonne Pax on 03/19/2022 11:41:46 ?[name], [name] ([id number]) ?-------------------------------------------------------------------------------- ?Lower Extremity Assessment Details ?Patient Name: [name], [name] ?Date of Service: 03/19/2022 11:00 AM ?Medical Record Number: [id number] ?Patient Account Number: [id number] ?Date of Birth/Sex: 07/06/1961 (61 y.o. F) ?Treating RN: Yevonne Pax ?Primary Care Garnetta Fedrick: CardJonny Ruiz Other Clinician: ?Referring Mamadou Breon: Card, John ?Treating Keiva Dina/Extender: Allen Derry ?Weeks in Treatment: 22 ?Electronic Signature(s) ?Signed: 03/19/2022 4:35:36 PM By: 03/21/2022 RN ?Entered By: Yevonne Pax on 03/19/2022 11:11:49 ?[name], [name] (03/21/2022) ?-------------------------------------------------------------------------------- ?Multi Wound Chart Details ?Patient Name: [name], [name] ?Date of Service: 03/19/2022 11:00 AM ?Medical Record Number: 03/21/2022 ?Patient Account Number: [id number] ?Date of Birth/Sex: [birth year] (61 y.o. F) ?Treating RN: (67 ?Primary Care Jazzlyn Huizenga: CardYevonne Pax Other Clinician: ?Referring Ovie Eastep: Card, John ?Treating Roverto Bodmer/Extender: Jonny Ruiz ?Weeks in Treatment: 22 ?Vital Signs ?Height(in): ?Pulse(bpm): 74 ?Weight(lbs): 275 ?Blood Pressure(mmHg): 128/79 ?Body Mass Index(BMI): ?Temperature(??F): 98.1 ?Respiratory Rate(breaths/min): 18 ?Photos: [N/A:N/A] ?Wound Location: Distal, Midline Back N/A N/A ?Wounding Event:  Surgical Injury N/A N/A ?Primary Etiology: Dehisced Wound N/A N/A ?Comorbid History: Hypertension N/A N/A ?Date Acquired: 04/11/2021 N/A N/A ?Weeks of Treatment: 22 N/A N/A ?Wound Status: Open N/A N/A ?Wound Recurrence: N

## 2022-03-19 NOTE — Progress Notes (Addendum)
[name], [name] ([id number]) ?Visit Report for 03/19/2022 ?Chief Complaint Document Details ?Patient Name: [name], [name] ?Date of Service: 03/19/2022 11:00 AM ?Medical Record Number: [id number] ?Patient Account Number: [id number] ?Date of Birth/Sex: October 19, 1961 (61 y.o. F) ?Treating RN: Yevonne Pax ?Primary Care Provider: CardJonny Ruiz Other Clinician: ?Referring Provider: Card, John ?Treating Provider/Extender: Allen Derry ?Weeks in Treatment: 22 ?Information Obtained from: Patient ?Chief Complaint ?Surgical Back Ulcer ?Electronic Signature(s) ?Signed: 03/19/2022 11:21:30 AM By: [name] PA-C ?Entered By: [name] on 03/19/2022 11:21:30 ?Nine, Korbyn C. ([id number]) ?-------------------------------------------------------------------------------- ?HPI Details ?Patient Name: [name], [name] ?Date of Service: 03/19/2022 11:00 AM ?Medical Record Number: [id number] ?Patient Account Number: [id number] ?Date of Birth/Sex: March 06, 1961 (61 y.o. 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 This was actually broken up into 3 separate ?surgical events. The initial surgical intervention actually was on November 05, 2021 almost a year ago. Subsequently the patient went back in ?February for a seroma of the area which unfortunately required her to have a repeat surgery to go in and clean this out. And then again this ?occurred in April where she went back in and again they felt like stitches were coming out and there was an additional seroma. She was placed in ?a wound VAC initially and then subsequently as it got smaller that  was discontinued. Again right now I will see anything that I think a wound VAC ?would help with. Nonetheless she definitely has a significant depth to the wound that is going require packing. I actually believe the Hydrofera ?Blue rope would probably do quite well with this the problem is as much as it is draining she probably needs this to be changed at least every day. ?She does not really have anyone that can help with that that is the complicating scenario here. With that being said the patient does have a ?history of multiple sclerosis, hypertension, and again this surgical wound dehiscence in regard to her lumbar spine region. She did have a repeat ?MRI which was actually completed 10/09/2021. This showed that there was no significant change in the subcutaneous fluid collection/track of the ?lower lumbar region. This is extending to the level of the fascia unfortunately. This seems to go all the way from the L2 level with a track ?extending all the way to the fascia at the L4-5 level. Again this is a significant wound and there is significant drainage but does not seem to ?communicate to the spinal region as far as spinal fluid or otherwise is concerned that is good news. Nonetheless she last saw Dr. Franky Macho who is ?her neurosurgeon on 10/01/2021 that was when he ordered this last MRI she supposed to see him next week as well. With that being said he did ?not feel like there was any significant issue there but was not sure why this was not healing that is when he ordered the MRI. They were wanting ?to make sure that this was packed appropriately by home health unfortunately the main issue currently is that home health is completely out of ?the picture as the patient has exhausted all the home health that that she gets for a year. She is now in a very difficult  predicament where she ?does not have anyone to help her change the dressing and to be honest that she is not able to do it herself with the location of  the wound being ?on the midline lumbar spine region. If she does not have anyone that can help it is probably can to be necessary for her to go to a facility for ?rehab and daily dressing changes as I feel like daily changes which is much drainage that she is having is going to be necessary. ?10/23/2021 upon evaluation today patient appears to be doing decently well in regard to her back ulcer. This does seem to be draining a lot less ?than what it is been doing in the past. With that being said she still has quite a bit of drainage nonetheless. I do think that given time this should ?improve least I hope so. The good news is she does have home health coming out 3 days a week were doing it 2 days a week and she is paying ?someone we can to help. ?10/30/2021 upon evaluation today patient appears to be doing okay in regard to her back ulcer this is not draining quite as bad as it was in the ?beginning but he still has quite a bit of drainage noted. I do believe that the patient would benefit from [place] going ahead forward with attempting a ?wound VAC using the Hydrofera Blue rope to pack with and then subsequently using the VAC externally to actually suction out and help this to fill- ?in. I think this is our ideal way to try to get things cleared at this point. As it stands I am not certain that we are really making a progress that we ?want to see near with doing it in the way we are which is packing with the rope. It is a good dressing but I do think it is insufficient for total ?healing. She just seems to have too much in the way of drainage at this point unfortunately. ?11/06/2021 upon evaluation today patient unfortunately continues to have issues with her back ongoing. The good news is her MRI that was ?repeated showed signs of the size of this area in the lumbar spine region having decreased from 4 cm to 3.5 cm this is definitely not bad news at ?all. With that being said unfortunately she continues to have issues  with ongoing drainage not as severe as in the beginning but nonetheless still ?significant. I do think a wound VAC still would be a good way to go although her home health agency nurse apparently has some concerns about ?the possibility of not being able to keep a seal with this as they had struggles in the past. Nonetheless I explained to the patient that this is much ?different than what she had previous and that I really feel like it would do much better as far as getting the area taken care of without having any ?complications or issues here. I think that we should be able to maintain a seal. Nonetheless at this time I did discuss with the patient as well that ?she probably does need to have a wound VAC in order for [place] to get this moving in the right direction. ?11/13/2021 upon evaluation patient's wound bed actually showed signs of significant drainage at this time. She did see the surgeon yesterday he ?did not see anything that appeared to be infected. Nonetheless he does appear that she is continuing to have areas here that just do not seem  to ?want to seal up there MRI findings have been negative but nonetheless she continues to have is the seroma that is filling in. I do feel like we need ?to try to widen the hole so we can get at least a half of the [facility] then this will be better than nothing at this point. ?11/20/2021 upon evaluation today patient actually appears to be having less pain at this point which is good news and overall she we still do not ?have the results of the culture back yet it had to be sent out to [facility] and we do not have the result back yet. Is doing decently well in regard to ?her wound. Fortunately there does not appear to be any signs of active infection systemically nor locally at this time. ?11/27/2021 upon evaluation today patient appears to be doing well with regard to her wound all things considered there does appear to be less ?drainage than there was previous.  Fortunately I do not see any evidence of worsening of the patient is stating that she is having some issues with ?back pain. This is somewhat new. Again this I think could be related to the fact that she is

## 2022-03-21 ENCOUNTER — Other Ambulatory Visit: Payer: Self-pay

## 2022-03-21 DIAGNOSIS — T8131XA Disruption of external operation (surgical) wound, not elsewhere classified, initial encounter: Secondary | ICD-10-CM | POA: Diagnosis not present

## 2022-03-21 NOTE — Progress Notes (Signed)
Luft, Marvelle C. ([id number]) ?Visit Report for 03/21/2022 ?Arrival Information Details ?Patient Name: [name], [name] ?Date of Service: 03/21/2022 3:30 PM ?Medical Record Number: [id number] ?Patient Account Number: [id number] ?Date of Birth/Sex: October 09, 1961 (61 y.o. F) ?Treating RN: Yevonne Pax ?Primary Care Shakeela Rabadan: CardJonny Ruiz Other Clinician: ?Referring Marquist Binstock: Card, John ?Treating Deshanta Lady/Extender: Allen Derry ?Weeks in Treatment: 22 ?Visit Information History Since Last Visit ?All ordered tests and consults were completed: No ?Patient Arrived: Ambulatory ?Added or deleted any medications: No ?Arrival Time: 15:59 ?Any new allergies or adverse reactions: No ?Accompanied By: self ?Had a fall or experienced change in No ?Transfer Assistance: None ?activities of daily living that may affect ?Patient Identification Verified: Yes ?risk of falls: ?Secondary Verification Process Completed: Yes ?Signs or symptoms of abuse/neglect since last visito No ?Patient Requires Transmission-Based Precautions: No ?Hospitalized since last visit: No ?Patient Has Alerts: No ?Implantable device outside of the clinic excluding No ?cellular tissue based products placed in the center ?since last visit: ?Has Dressing in Place as Prescribed: Yes ?Pain Present Now: No ?Electronic Signature(s) ?Signed: 03/21/2022 4:22:30 PM By: Yevonne Pax RN ?Entered By: Yevonne Pax on 03/21/2022 16:00:51 ?Marling, Dynastie C. ([id number]) ?-------------------------------------------------------------------------------- ?Clinic Level of Care Assessment Details ?Patient Name: [name] ?Date of Service: 03/21/2022 3:30 PM ?Medical Record Number: [id number] ?Patient Account Number: [id number] ?Date of Birth/Sex: [birth year] (61 y.o. F) ?Treating RN: Yevonne Pax ?Primary Care Stasha Naraine: CardJonny Ruiz Other Clinician: ?Referring Desani Sprung: Card, John ?Treating Jalesia Loudenslager/Extender: Allen Derry ?Weeks in Treatment: 22 ?Clinic Level of Care Assessment  Items ?TOOL 4 Quantity Score ?X - Use when only an EandM is performed on FOLLOW-UP visit 1 0 ?ASSESSMENTS - Nursing Assessment / Reassessment ?X - Reassessment of Co-morbidities (includes updates in patient status) 1 10 ?X- 1 5 ?Reassessment of Adherence to Treatment Plan ?ASSESSMENTS - Wound and Skin Assessment / Reassessment ?X - Simple Wound Assessment / Reassessment - one wound 1 5 ?[]  - 0 ?Complex Wound Assessment / Reassessment - multiple wounds ?[]  - 0 ?Dermatologic / Skin Assessment (not related to wound area) ?ASSESSMENTS - Focused Assessment ?[]  - Circumferential Edema Measurements - multi extremities 0 ?[]  - 0 ?Nutritional Assessment / Counseling / Intervention ?[]  - 0 ?Lower Extremity Assessment (monofilament, tuning fork, pulses) ?[]  - 0 ?Peripheral Arterial Disease Assessment (using hand held doppler) ?ASSESSMENTS - Ostomy and/or Continence Assessment and Care ?[]  - Incontinence Assessment and Management 0 ?[]  - 0 ?Ostomy Care Assessment and Management (repouching, etc.) ?PROCESS - Coordination of Care ?X - Simple Patient / Family Education for ongoing care 1 15 ?[]  - 0 ?Complex (extensive) Patient / Family Education for ongoing care ?[]  - 0 ?Staff obtains Consents, Records, Test Results / Process Orders ?[]  - 0 ?Staff telephones HHA, Nursing Homes / Clarify orders / etc ?[]  - 0 ?Routine Transfer to another Facility (non-emergent condition) ?[]  - 0 ?Routine Hospital Admission (non-emergent condition) ?[]  - 0 ?New Admissions / / Ordering NPWT, Apligraf, etc. ?[]  - 0 ?Emergency Hospital Admission (emergent condition) ?X- 1 10 ?Simple Discharge Coordination ?[]  - 0 ?Complex (extensive) Discharge Coordination ?PROCESS - Special Needs ?[]  - Pediatric / Minor Patient Management 0 ?[]  - 0 ?Isolation Patient Management ?[]  - 0 ?Hearing / Language / Visual special needs ?[]  - 0 ?Assessment of Community assistance (transportation, D/C planning, etc.) ?[]  - 0 ?Additional assistance /  Altered mentation ?[]  - 0 ?Support Surface(s) Assessment (bed, cushion, seat, etc.) ?INTERVENTIONS - Wound Cleansing / Measurement ?Ladouceur, Tayla C. ( ) ?X- 1 5 ?Simple Wound Cleansing -  one wound ?[]  - 0 ?Complex Wound Cleansing - multiple wounds ?[]  - 0 ?Wound Imaging (photographs - any number of wounds) ?[]  - 0 ?Wound Tracing (instead of photographs) ?X- 1 5 ?Simple Wound Measurement - one wound ?[]  - 0 ?Complex Wound Measurement - multiple wounds ?INTERVENTIONS - Wound Dressings ?[]  - Small Wound Dressing one or multiple wounds 0 ?X- 1 15 ?Medium Wound Dressing one or multiple wounds ?[]  - 0 ?Large Wound Dressing one or multiple wounds ?[]  - 0 ?Application of Medications - topical ?[]  - 0 ?Application of Medications - injection ?INTERVENTIONS - Miscellaneous ?[]  - External ear exam 0 ?[]  - 0 ?Specimen Collection (cultures, biopsies, blood, body fluids, etc.) ?[]  - 0 ?Specimen(s) / Culture(s) sent or taken to Lab for analysis ?[]  - 0 ?Patient Transfer (multiple staff / / Similar devices) ?[]  - 0 ?Simple Staple / Suture removal (25 or less) ?[]  - 0 ?Complex Staple / Suture removal (26 or more) ?[]  - 0 ?Hypo / Hyperglycemic Management (close monitor of Blood Glucose) ?[]  - 0 ?Ankle / Brachial Index (ABI) - do not check if billed separately ?[]  - 0 ?Vital Signs ?Has the patient been seen at the hospital within the last three years: Yes ?Total Score: 70 ?Level Of Care: New/Established - ?Level 2 ?Electronic Signature(s) ?Signed: 03/21/2022 4:22:30 PM By: RN ?Entered By: on 03/21/2022 16:03:40 ?Norenberg, Arlone C. ( ) ?-------------------------------------------------------------------------------- ?Encounter Discharge Information Details ?Patient Name: [name] ?Date of Service: 03/21/2022 3:30 PM ?Medical Record Number: ?Patient Account Number: ?Date of Birth/Sex: 1961/04/26 (62 y.o. F) ?Treating RN: Nurse, adult ?Primary Care  Jeanpaul Biehl: Card Other Clinician: ?Referring Roxas Clymer: Card, John ?Treating Wayman Hoard/Extender: ?Weeks in Treatment: 22 ?Encounter Discharge Information Items ?Discharge Condition: Stable ?Ambulatory Status: ?Discharge Destination: Home ?Transportation: Private Auto ?Accompanied By: self ?Schedule Follow-up Appointment: Yes ?Clinical Summary of Care: Patient Declined ?Electronic Signature(s) ?Signed: 03/21/2022 4:22:30 PM By: RN ?Entered By: 03/23/2022 on 03/21/2022 16:03:08 ?Cerasoli, Evany C. (Yevonne Pax) ?-------------------------------------------------------------------------------- ?Wound Assessment Details ?Patient Name: ARRIAH, WADLE ?Date of Service: 03/21/2022 3:30 PM ?Medical Record Number: Lowella Grip ?Patient Account Number: 03/23/2022 ?Date of Birth/Sex: [birth year] (61 y.o. F) ?Treating RN: 09/11/1961 ?Primary Care Merelin Human: Card(67 Other Clinician: ?Referring Rhea Thrun: Card, John ?Treating Drayk Humbarger/Extender: Yevonne Pax ?Weeks in Treatment: 22 ?Wound Status ?Wound Number: 1 ?Primary Etiology: Dehisced Wound ?Wound Location: Distal, Midline Back ?Wound Status: Open ?Wounding Event: Surgical Injury ?Comorbid History: Hypertension ?Date Acquired: 04/11/2021 ?Weeks Of Treatment: 22 ?Clustered Wound: No ?Wound Measurements ?Length: (cm) 0.5 ?Width: (cm) 0.4 ?Depth: (cm) 2.8 ?Area: (cm?) 0.157 ?Volume: (cm?) 0.44 ?% Reduction in Area: -24.6% ?% Reduction in Volume: 23.9% ?Epithelialization: None ?Tunneling: No ?Undermining: No ?Wound Description ?Classification: Full Thickness Without Exposed Support Structu ?Exudate Amount: Medium ?Exudate Type: Serous ?Exudate Color: amber ?res Foul Odor After Cleansing: No ?Slough/Fibrino No ?Wound Bed ?Granulation Amount: Large (67-100%) Exposed Structure ?Granulation Quality: Red ?Fascia Exposed: No ?Necrotic Amount: None Present (0%) ?Fat Layer (Subcutaneous Tissue) Exposed: Yes ?Tendon Exposed: No ?Muscle Exposed: No ?Joint  Exposed: No ?Bone Exposed: No ?Treatment Notes ?Wound #1 (Back) Wound Laterality: Midline, Distal ?Cleanser ?Normal Saline ?Discharge Instruction: Wash your hands with soap and water. Remove old dressing, discard into plas

## 2022-03-22 ENCOUNTER — Ambulatory Visit: Payer: 59 | Admitting: Occupational Therapy

## 2022-03-22 DIAGNOSIS — I890 Lymphedema, not elsewhere classified: Secondary | ICD-10-CM | POA: Diagnosis not present

## 2022-03-22 NOTE — Therapy (Signed)
[facility] ?[place], [name], [address] ?Phone: [phone]   Fax:  [phone] ? ?Occupational Therapy Treatment ? ?Patient Details  ?Name: [name] ?MRN: [id number] ?Date of Birth: 1961-11-05 ?Referring Provider (OT): Crystal Arringtonm FNP ? ? ?Encounter Date: 03/22/2022 ? ? OT End of Session - 03/22/22 1100   ? ? Visit Number 7   ? Number of Visits 36   ? Date for OT Re-Evaluation 05/15/22   ? OT Start Time 1100   ? Activity Tolerance Patient tolerated treatment well;No increased pain   ? Behavior During Therapy [facility] for tasks assessed/performed   ? ?  ?  ? ?  ? ? ?Past Medical History:  ?Diagnosis Date  ? Arthritis   ? Back pain   ? Chronic knee pain   ? Edema of both lower extremities   ? High blood pressure   ? High cholesterol   ? Joint pain   ? Multiple sclerosis ([place])   ? Neuromuscular disorder ([place])   ? Multiple Sclerosis over 20 years  ? Obesity   ? Vitamin D deficiency   ? ? ?Past Surgical History:  ?Procedure Laterality Date  ? APPENDECTOMY    ? CESAREAN SECTION    ? HAND SURGERY    ? lumbar back surgery    ? LUMBAR WOUND DEBRIDEMENT N/A 12/05/2020  ? Procedure: Lumbar Wound Exploration;  Surgeon: Ashok Pall, MD;  Location: [facility];  Service: Neurosurgery;  Laterality: N/A;  3C/RM 21  ? LUMBAR WOUND DEBRIDEMENT N/A 02/21/2021  ? Procedure: Lumbar wound exploration;  Surgeon: Ashok Pall, MD;  Location: [place] [facility];  Service: Neurosurgery;  Laterality: N/A;  posterior  ? LUMBAR WOUND DEBRIDEMENT N/A 04/11/2021  ? Procedure: Wound Exploration with Wound Vac Placement;  Surgeon: Ashok Pall, MD;  Location: [facility];  Service: Neurosurgery;  Laterality: N/A;  ? ROTATOR CUFF REPAIR    ? TONSILLECTOMY    ? ? ?There were no vitals filed for this visit. ? ? Subjective Assessment - 03/22/22 1109   ? ? Subjective  [name] returns to OT for treatment visit 6736 to address BLE lymphedema. Pt denies LE related pain in her legs. Pt  reports she wrapped herself  twice during the interval. "The hardest part is doing my feet."   ? Pertinent History B knee OA, HTN, Obesity, MS, Spondylolisthesis-lumbar, open wound at lumbar surgical site since 4/22.   ? Limitations difficulty walking, impaired transfers, chronic OA pain in bilateral knees, non healing post surgical lumbar wound, BLE muscle weakness 2/2 MS, chronic leg swelling with minimal pain,   ? Special Tests - Stemmer bilaterally; Intake FOTO score =31/100   ? Patient Stated Goals get my legs better so I can do more   ? Pain Onset Other (comment)   6 mnths ago without precipitating event. No family hx  ? Pain Onset Other (comment)   years  ? ?  ?  ? ?  ? ? ? ? ? ? ? ? ? ? ? ? ? ? ? ? ? ? ? ? ? ? ? OT Education - 03/22/22 1109   ? ? Education Details Continued skilled Pt/caregiver education  And LE ADL training throughout visit for lymphedema self care/ home program, including compression wrapping, compression garment and device wear/care, lymphatic pumping ther ex, simple self-MLD, and skin care. Discussed initial volumetric measurements. Discussed all long term goals.   ? Person(s) Educated Patient   ? Methods  Explanation;Demonstration;Handout   ? Comprehension Verbalized understanding;Returned demonstration;Need further instruction   ? ?  ?  ? ?  ? ? ? ? ? ? OT Long Term Goals - 02/15/22 1210   ? ?  ? OT LONG TERM GOAL #1  ? Title Given this patient?s Intake score 31 /100 on the functional outcomes FOTO tool, patient will experience an increase in function of 5 points to improve basic and instrumental ADLs performance, including lymphedema self-care.   ? Baseline 31/100   ? Period Weeks   ? Status New   ? Target Date 05/16/22   ?  ? OT LONG TERM GOAL #2  ? Title Pt will demonstrate understanding of lymphedema prevention strategies by identifying and discussing 5 precautions using printed reference (modified assistance) to reduce risk of progression and to limit infection risk.   ? Baseline  Max A   ? Time 4   ? Period Days   ? Status New   ? Target Date --   4th OT Rx visit  ?  ? OT LONG TERM GOAL #3  ? Title With Maximum caregiver assistance Pt will be able to apply multilayer, LUE compression wraps using gradient techniques to decrease limb volume, to limit infection risk, and to limit lymphedema progression. Pt unable to wrap legs independently. Trained caregiver must assist during visit intervals for CDT to be effective.   ? Baseline dependent - Max caregiver assistance is necessary to complete Intensive Phase of Complete Decongestive Therapy and Lymphedema self-care home program  as Pt is unable to reach his feet to apply compression wraps and garments, to inspect skin, to groom nails to perform skin care and inspection and to perform simple self-MLD. Pt agrees to arrange for consistent caregiver prior to commencing CDT.   ? Time 4   ? Period Days   ? Status New   ? Target Date --   4th OT Rx visit  ?  ? OT LONG TERM GOAL #4  ? Title With Maximum caregiver assistance between OT sessions Pt will achieve at least a 10% limb volume reduction below the knee bilaterally to return limb to more normal size and shape, to limit infection risk, to decrease pain, to improve function, and to limit lymphedema progression.   ? Baseline dependent   ? Time 12   ? Period Weeks   ? Status New   ? Target Date 05/16/22   ?  ? OT LONG TERM GOAL #5  ? Title With caregiver assistance  Pt will achieve and sustain a least 85% compliance with all 4 LE self-care home program components throughout Intensive Phase CDT, including modified simple self-MLD, daily skin inspection and care, lymphatic pumping the ex, 23/7 compression wraps to optimize limb volume reductions, to limit lymphedema progression and to limit further functional decline.   ? Baseline Dependent   ? Time 12   ? Period Weeks   ? Target Date 05/16/22   ? ?  ?  ? ?  ? ? ? ? ? ? ? ? Plan - 03/22/22 1202   ? ? Clinical Impression Statement Pt demonstrates  excellent response to therapy this far with visible and palpable decrease in limb volume thus far. Emphasis of session on MLD and simultaneous skin care. Pt tolerating all aspects CDT without increased pain.  Cont as per POC.   ? OT Occupational Profile and History Detailed Assessment- Review of Records and additional review of physical, cognitive, psychosocial history related to current functional performance   ?  Occupational performance deficits (Please refer to evaluation for details): ADL's;IADL's;Rest and Sleep;Leisure;Social Participation;Work   ? Rehab Potential Fair   ? Clinical Decision Making Several treatment options, min-mod task modification necessary   ? Comorbidities Affecting Occupational Performance: May have comorbidities impacting occupational performance   ? Modification or Assistance to Complete Evaluation  Min-Moderate modification of tasks or assist with assess necessary to complete eval   ? OT Frequency 2x / week   ? OT Duration 12 weeks   ? OT Treatment/Interventions Self-care/ADL training;DME and/or AE instruction;Manual lymph drainage;Compression bandaging;Therapeutic activities;Coping strategies training;Therapeutic exercise;Other (comment);Manual Therapy;Energy conservation;Patient/family education   skin care, Flexitouch trial, fit with appropriate compression garments that are comfortable, effective and that Pt is able to don and doff using assistive devices PRN  ? Recommended Other Services " Calcium channel blockers may not directly impair lymphatic function. However our results show that a reduced lymphatic function predisposes to CBC edema, which may explain why some patients develop edema during treatment." [employer], Niklas Telinius, Claiborne Billings, and Vibeke Hjortdal.  Reduced Lymphatic Function Predisposes to Calcium Channel Blocker Edema: A Randomized Placebo-Controlled Clinical Trial.  Lymphatic Research and Biology.Apr  [email or web address]://[email or web address]/[email or web address]  Published in Volume: 18 Issue 2: April 16, 2019  Online Ahead of Print:August 18, 2018   ? Consulted and Agree with Plan of Care Patient   ? ?  ?  ? ?  ? ? ?Patient will benefit from skilled therapeutic in

## 2022-03-22 NOTE — Patient Instructions (Signed)

## 2022-03-22 NOTE — Therapy (Signed)
[place] ?[facility] ?[address] ?[place], [place], [address] ?Phone: [phone]   Fax:  [phone] ? ?Occupational Therapy Treatment ? ?Patient Details  ?Name: [name] ?MRN: [id number] ?Date of Birth: 11-08-1961 ?Referring Provider (OT): Crystal Arringtonm FNP ? ? ?Encounter Date: 03/19/2022 ? ? OT End of Session - 03/22/22 0937   ? ? Visit Number 6   ? Number of Visits 36   ? Date for OT Re-Evaluation 05/15/22   ? OT Start Time 0100   ? OT Stop Time 0205   ? OT Time Calculation (min) 65 min   ? Activity Tolerance Patient tolerated treatment well;No increased pain   ? Behavior During Therapy [facility] for tasks assessed/performed   ? ?  ?  ? ?  ? ? ?Past Medical History:  ?Diagnosis Date  ? Arthritis   ? Back pain   ? Chronic knee pain   ? Edema of both lower extremities   ? High blood pressure   ? High cholesterol   ? Joint pain   ? Multiple sclerosis (HCC)   ? Neuromuscular disorder (HCC)   ? Multiple Sclerosis over 20 years  ? Obesity   ? Vitamin D deficiency   ? ? ?Past Surgical History:  ?Procedure Laterality Date  ? APPENDECTOMY    ? CESAREAN SECTION    ? HAND SURGERY    ? lumbar back surgery    ? LUMBAR WOUND DEBRIDEMENT N/A 12/05/2020  ? Procedure: Lumbar Wound Exploration;  Surgeon: Coletta Memos, MD;  Location: [facility] OR;  Service: Neurosurgery;  Laterality: N/A;  3C/RM 21  ? LUMBAR WOUND DEBRIDEMENT N/A 02/21/2021  ? Procedure: Lumbar wound exploration;  Surgeon: Coletta Memos, MD;  Location: [facility] OR;  Service: Neurosurgery;  Laterality: N/A;  posterior  ? LUMBAR WOUND DEBRIDEMENT N/A 04/11/2021  ? Procedure: Wound Exploration with Wound Vac Placement;  Surgeon: Coletta Memos, MD;  Location: [facility] OR;  Service: Neurosurgery;  Laterality: N/A;  ? ROTATOR CUFF REPAIR    ? TONSILLECTOMY    ? ? ?There were no vitals filed for this visit. ? ? Subjective Assessment - 03/22/22 0940   ? ? Subjective  [name] returns to OT for treatment visit 6/36 to address BLE  lymphedema. Pt denies LE related pain in her legs. Pt reports she wrapped herself  twice during the interval. "The hardest part is doing my feet."   ? Pertinent History B knee OA, HTN, Obesity, MS, Spondylolisthesis-lumbar, open wound at lumbar surgical site since 4/22.   ? Limitations difficulty walking, impaired transfers, chronic OA pain in bilateral knees, non healing post surgical lumbar wound, BLE muscle weakness 2/2 MS, chronic leg swelling with minimal pain,   ? Special Tests - Stemmer bilaterally; Intake FOTO score =31/100   ? Patient Stated Goals get my legs better so I can do more   ? Currently in Pain? No/denies   ? Pain Onset Other (comment)   6 mnths ago without precipitating event. No family hx  ? Pain Onset Other (comment)   years  ? ?  ?  ? ?  ? ? ? ? ? ? ? ? ? ? ? ? ? ? ? OT Treatments/Exercises (OP) - 03/22/22 2423   ? ?  ? ADLs  ? ADL Education Given Yes   ?  ? Manual Therapy  ? Manual Therapy Edema management   ? Manual Lymphatic Drainage (MLD) MLD to RLE/RLQ utilizing diaphragmatic breathing to access deep abdominal  pathways, short neck sequence and inguinal LN, then proovided MLD from proximal to distal addressing thigh, leg and foot. Foinished up session with short neck x 3 and deep breathing.   ? Compression Bandaging LLE multilayer compression wrap from base of toes to popliteal fossa using 1 ea 8, 10 and 12 cm wide short stretch bandage over cotton stockinette and 0.4 cm thick Rosidal foam.   ? ?  ?  ? ?  ? ? ? ? ? ? ? ? ? OT Education - 03/22/22 0941   ? ? Education Details Continued skilled Pt/caregiver education  And LE ADL training throughout visit for lymphedema self care/ home program, including compression wrapping, compression garment and device wear/care, lymphatic pumping ther ex, simple self-MLD, and skin care. Discussed initial volumetric measurements. Discussed all long term goals.   ? Person(s) Educated Patient   ? Methods Explanation;Demonstration;Handout   ? Comprehension  Verbalized understanding;Returned demonstration;Need further instruction   ? ?  ?  ? ?  ? ? ? ? ? ? OT Long Term Goals - 02/15/22 1210   ? ?  ? OT LONG TERM GOAL #1  ? Title Given this patient?s Intake score 31 /100 on the functional outcomes FOTO tool, patient will experience an increase in function of 5 points to improve basic and instrumental ADLs performance, including lymphedema self-care.   ? Baseline 31/100   ? Period Weeks   ? Status New   ? Target Date 05/16/22   ?  ? OT LONG TERM GOAL #2  ? Title Pt will demonstrate understanding of lymphedema prevention strategies by identifying and discussing 5 precautions using printed reference (modified assistance) to reduce risk of progression and to limit infection risk.   ? Baseline Max A   ? Time 4   ? Period Days   ? Status New   ? Target Date --   4th OT Rx visit  ?  ? OT LONG TERM GOAL #3  ? Title With Maximum caregiver assistance Pt will be able to apply multilayer, LUE compression wraps using gradient techniques to decrease limb volume, to limit infection risk, and to limit lymphedema progression. Pt unable to wrap legs independently. Trained caregiver must assist during visit intervals for CDT to be effective.   ? Baseline dependent - Max caregiver assistance is necessary to complete Intensive Phase of Complete Decongestive Therapy and Lymphedema self-care home program  as Pt is unable to reach his feet to apply compression wraps and garments, to inspect skin, to groom nails to perform skin care and inspection and to perform simple self-MLD. Pt agrees to arrange for consistent caregiver prior to commencing CDT.   ? Time 4   ? Period Days   ? Status New   ? Target Date --   4th OT Rx visit  ?  ? OT LONG TERM GOAL #4  ? Title With Maximum caregiver assistance between OT sessions Pt will achieve at least a 10% limb volume reduction below the knee bilaterally to return limb to more normal size and shape, to limit infection risk, to decrease pain, to improve  function, and to limit lymphedema progression.   ? Baseline dependent   ? Time 12   ? Period Weeks   ? Status New   ? Target Date 05/16/22   ?  ? OT LONG TERM GOAL #5  ? Title With caregiver assistance  Pt will achieve and sustain a least 85% compliance with all 4 LE self-care home program components throughout  Intensive Phase CDT, including modified simple self-MLD, daily skin inspection and care, lymphatic pumping the ex, 23/7 compression wraps to optimize limb volume reductions, to limit lymphedema progression and to limit further functional decline.   ? Baseline Dependent   ? Time 12   ? Period Weeks   ? Target Date 05/16/22   ? ?  ?  ? ?  ? ? ? ? ? ? ? ? Plan - 03/22/22 0937   ? ? Clinical Impression Statement Continued teaching self bandaging. Pt able to applty wraps using correct techniques with mod assist, including   verbal and tactile cues, by end of practice. Commenced into level MLD and teaching , consisting of lymphatic anatomy review and skin stretch. Good return for J stroke with Mod A. Cont as epr POC.   ? OT Occupational Profile and History Detailed Assessment- Review of Records and additional review of physical, cognitive, psychosocial history related to current functional performance   ? Occupational performance deficits (Please refer to evaluation for details): ADL's;IADL's;Rest and Sleep;Leisure;Social Participation;Work   ? Rehab Potential Fair   ? Clinical Decision Making Several treatment options, min-mod task modification necessary   ? Comorbidities Affecting Occupational Performance: May have comorbidities impacting occupational performance   ? Modification or Assistance to Complete Evaluation  Min-Moderate modification of tasks or assist with assess necessary to complete eval   ? OT Frequency 2x / week   ? OT Duration 12 weeks   ? OT Treatment/Interventions Self-care/ADL training;DME and/or AE instruction;Manual lymph drainage;Compression bandaging;Therapeutic activities;Coping strategies  training;Therapeutic exercise;Other (comment);Manual Therapy;Energy conservation;Patient/family education   skin care, Flexitouch trial, fit with appropriate compression garments that are comfortable, eff

## 2022-03-23 NOTE — Progress Notes (Signed)
Burcher, Montgomery C. ([id number]) ?Visit Report for 03/21/2022 ?Physician Orders Details ?Patient Name: [name], [name] ?Date of Service: 03/21/2022 3:30 PM ?Medical Record Number: [id number] ?Patient Account Number: [id number] ?Date of Birth/Sex: 1961/03/16 (61 y.o. F) ?Treating RN: Yevonne Pax ?Primary Care Provider: CardJonny Ruiz Other Clinician: ?Referring Provider: Card, John ?Treating Provider/Extender: Allen Derry ?Weeks in Treatment: 22 ?Verbal / Phone Orders: No ?Diagnosis Coding ?Follow-up Appointments ?o Return Appointment in 1 week. ?o Nurse Visit as needed - nurse visit on tuesday and thursday ?Home Health ?o Home Health Company: - [facility] ?o CONTINUE Home Health for wound care. May utilize formulary equivalent dressing for wound treatment orders unless ?otherwise specified. Home Health Nurse may visit PRN to address patientos wound care needs. - 3 times per week ?[facility] fax [phone] ?Bathing/ Shower/ Hygiene ?o May shower; gently cleanse wound with antibacterial soap, rinse and pat dry prior to dressing wounds ?o No tub bath. ?Anesthetic (Use 'Patient Medications' Section for Anesthetic Order Entry) ?o Lidocaine applied to wound bed ?Edema Control - Lymphedema / Segmental Compressive Device / Other ?o Elevate, Exercise Daily and Avoid Standing for Long Periods of Time. ?o Elevate legs to the level of the heart and pump ankles as often as possible ?o Elevate leg(s) parallel to the floor when sitting. ?Additional Orders / Instructions ?o Follow Nutritious Diet and Increase Protein Intake ?Wound Treatment ?Wound #1 - Back Wound Laterality: Midline, Distal ?Cleanser: Normal Saline 1 x Per Day/30 Days ?Discharge Instructions: Wash your hands with soap and water. Remove old dressing, discard into plastic bag and place into trash. ?Cleanse the wound with Normal Saline prior to applying a clean dressing using gauze sponges, not tissues or cotton balls. Do not scrub ?or use excessive force.  Pat dry using gauze sponges, not tissue or cotton balls. ?Primary Dressing: Hydrofera Blue Classic Foam Rope Dressing, 9x6 (mm/in) 1 x Per Day/30 Days ?Discharge Instructions: cut rope in 1/2 lengthwise AND LEAVE A TAIL OUT TO GRAB IT ?Secondary Dressing: (SILICONE BORDER) Zetuvit Plus SILICONE BORDER Dressing 4x4 (in/in) 1 x Per Day/30 Days ?Electronic Signature(s) ?Signed: 03/21/2022 4:22:30 PM By: Yevonne Pax RN ?Signed: 03/22/2022 5:58:01 PM By: [name] PA-C ?Entered By: Yevonne Pax on 03/21/2022 16:02:09 ?[name], [name] ([id number]) ?-------------------------------------------------------------------------------- ?SuperBill Details ?Patient Name: [name], [name] ?Date of Service: 03/21/2022 ?Medical Record Number: [id number] ?Patient Account Number: [id number] ?Date of Birth/Sex: [birth year] (61 y.o. 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

## 2022-03-26 ENCOUNTER — Other Ambulatory Visit: Payer: Self-pay

## 2022-03-26 ENCOUNTER — Encounter: Payer: 59 | Admitting: Physician Assistant

## 2022-03-26 ENCOUNTER — Ambulatory Visit: Payer: 59 | Admitting: Occupational Therapy

## 2022-03-26 DIAGNOSIS — I890 Lymphedema, not elsewhere classified: Secondary | ICD-10-CM

## 2022-03-26 DIAGNOSIS — T8131XA Disruption of external operation (surgical) wound, not elsewhere classified, initial encounter: Secondary | ICD-10-CM | POA: Diagnosis not present

## 2022-03-26 NOTE — Patient Instructions (Signed)

## 2022-03-26 NOTE — Progress Notes (Addendum)
Bowland, Deirdra C. ([id number]) ?Visit Report for 03/26/2022 ?Chief Complaint Document Details ?Patient Name: [name], [name] ?Date of Service: 03/26/2022 11:00 AM ?Medical Record Number: [id number] ?Patient Account Number: [id number] ?Date of Birth/Sex: [birth year] (61 y.o. F) ?Treating RN: Yevonne Pax ?Primary Care Provider: CardJonny Ruiz Other Clinician: ?Referring Provider: Card, John ?Treating Provider/Extender: Allen Derry ?Weeks in Treatment: 23 ?Information Obtained from: Patient ?Chief Complaint ?Surgical Back Ulcer ?Electronic Signature(s) ?Signed: 03/26/2022 11:18:56 AM By: [name] PA-C ?Entered By: [name] on 03/26/2022 11:18:56 ?Chismar, Maricruz C. ([id number]) ?-------------------------------------------------------------------------------- ?HPI Details ?Patient Name: [name], [name] ?Date of Service: 03/26/2022 11:00 AM ?Medical Record Number: [id number] ?Patient Account Number: [id number] ?Date of Birth/Sex: March 20, 1961 (61 y.o. F) ?Treating RN: Yevonne Pax ?Primary Care Provider: CardJonny Ruiz Other Clinician: ?Referring Provider: Card, John ?Treating Provider/Extender: Allen Derry ?Weeks in Treatment: 23 ?History of Present Illness ?HPI Description: 10/15/2021 upon evaluation today patient presents for initial evaluation here in the clinic concerning a surgical ?ulceration/dehiscence in the lumbar spine region following surgery that she had over the past year. This was actually broken up into 3 separate ?surgical events. The initial surgical intervention actually was on November 05, 2021 almost a year ago. Subsequently the patient went back in ?February for a seroma of the area which unfortunately required her to have a repeat surgery to go in and clean this out. And then again this ?occurred in April where she went back in and again they felt like stitches were coming out and there was an additional seroma. She was placed in ?a wound VAC initially and then subsequently as it got smaller that  was discontinued. Again right now I will see anything that I think a wound VAC ?would help with. Nonetheless she definitely has a significant depth to the wound that is going require packing. I actually believe the Hydrofera ?Blue rope would probably do quite well with this the problem is as much as it is draining she probably needs this to be changed at least every day. ?She does not really have anyone that can help with that that is the complicating scenario here. With that being said the patient does have a ?history of multiple sclerosis, hypertension, and again this surgical wound dehiscence in regard to her lumbar spine region. She did have a repeat ?MRI which was actually completed 10/09/2021. This showed that there was no significant change in the subcutaneous fluid collection/track of the ?lower lumbar region. This is extending to the level of the fascia unfortunately. This seems to go all the way from the L2 level with a track ?extending all the way to the fascia at the L4-5 level. Again this is a significant wound and there is significant drainage but does not seem to ?communicate to the spinal region as far as spinal fluid or otherwise is concerned that is good news. Nonetheless she last saw Dr. Franky Macho who is ?her neurosurgeon on 10/01/2021 that was when he ordered this last MRI she supposed to see him next week as well. With that being said he did ?not feel like there was any significant issue there but was not sure why this was not healing that is when he ordered the MRI. They were wanting ?to make sure that this was packed appropriately by home health unfortunately the main issue currently is that home health is completely out of ?the picture as the patient has exhausted all the home health that that she gets for a year. She is now in a very difficult  predicament where she ?does not have anyone to help her change the dressing and to be honest that she is not able to do it herself with the location of  the wound being ?on the midline lumbar spine region. If she does not have anyone that can help it is probably can to be necessary for her to go to a facility for ?rehab and daily dressing changes as I feel like daily changes which is much drainage that she is having is going to be necessary. ?10/23/2021 upon evaluation today patient appears to be doing decently well in regard to her back ulcer. This does seem to be draining a lot less ?than what it is been doing in the past. With that being said she still has quite a bit of drainage nonetheless. I do think that given time this should ?improve least I hope so. The good news is she does have home health coming out 3 days a week were doing it 2 days a week and she is paying ?someone we can to help. ?10/30/2021 upon evaluation today patient appears to be doing okay in regard to her back ulcer this is not draining quite as bad as it was in the ?beginning but he still has quite a bit of drainage noted. I do believe that the patient would benefit from [place] going ahead forward with attempting a ?wound VAC using the Hydrofera Blue rope to pack with and then subsequently using the VAC externally to actually suction out and help this to fill- ?in. I think this is our ideal way to try to get things cleared at this point. As it stands I am not certain that we are really making a progress that we ?want to see near with doing it in the way we are which is packing with the rope. It is a good dressing but I do think it is insufficient for total ?healing. She just seems to have too much in the way of drainage at this point unfortunately. ?11/06/2021 upon evaluation today patient unfortunately continues to have issues with her back ongoing. The good news is her MRI that was ?repeated showed signs of the size of this area in the lumbar spine region having decreased from 4 cm to 3.5 cm this is definitely not bad news at ?all. With that being said unfortunately she continues to have issues  with ongoing drainage not as severe as in the beginning but nonetheless still ?significant. I do think a wound VAC still would be a good way to go although her home health agency nurse apparently has some concerns about ?the possibility of not being able to keep a seal with this as they had struggles in the past. Nonetheless I explained to the patient that this is much ?different than what she had previous and that I really feel like it would do much better as far as getting the area taken care of without having any ?complications or issues here. I think that we should be able to maintain a seal. Nonetheless at this time I did discuss with the patient as well that ?she probably does need to have a wound VAC in order for [place] to get this moving in the right direction. ?11/13/2021 upon evaluation patient's wound bed actually showed signs of significant drainage at this time. She did see the surgeon yesterday he ?did not see anything that appeared to be infected. Nonetheless he does appear that she is continuing to have areas here that just do not seem  to ?want to seal up there MRI findings have been negative but nonetheless she continues to have is the seroma that is filling in. I do feel like we need ?to try to widen the hole so we can get at least a half of the [facility] then this will be better than nothing at this point. ?11/20/2021 upon evaluation today patient actually appears to be having less pain at this point which is good news and overall she we still do not ?have the results of the culture back yet it had to be sent out to [facility] and we do not have the result back yet. Is doing decently well in regard to ?her wound. Fortunately there does not appear to be any signs of active infection systemically nor locally at this time. ?11/27/2021 upon evaluation today patient appears to be doing well with regard to her wound all things considered there does appear to be less ?drainage than there was previous.  Fortunately I do not see any evidence of worsening of the patient is stating that she is having some issues with ?back pain. This is somewhat new. Again this I think could be related to the fact that she is

## 2022-03-26 NOTE — Therapy (Signed)
[place] ?[facility] ?[address] ?[place], [place], [address] ?Phone: [phone]   Fax:  [phone] ? ?Occupational Therapy Treatment ? ?Patient Details  ?Name: [name] ?MRN: [id number] ?Date of Birth: [birth year] ?Referring Provider (OT): Crystal Arringtonm FNP ? ? ?Encounter Date: 03/26/2022 ? ? OT End of Session - 03/26/22 1313   ? ? Visit Number 7   ? Number of Visits 36   ? Date for OT Re-Evaluation 05/15/22   ? OT Start Time 0106   ? OT Stop Time 0206   ? OT Time Calculation (min) 60 min   ? Activity Tolerance Patient tolerated treatment well;No increased pain   ? Behavior During Therapy [facility] for tasks assessed/performed   ? ?  ?  ? ?  ? ? ?Past Medical History:  ?Diagnosis Date  ? Arthritis   ? Back pain   ? Chronic knee pain   ? Edema of both lower extremities   ? High blood pressure   ? High cholesterol   ? Joint pain   ? Multiple sclerosis (HCC)   ? Neuromuscular disorder (HCC)   ? Multiple Sclerosis over 20 years  ? Obesity   ? Vitamin D deficiency   ? ? ?Past Surgical History:  ?Procedure Laterality Date  ? APPENDECTOMY    ? CESAREAN SECTION    ? HAND SURGERY    ? lumbar back surgery    ? LUMBAR WOUND DEBRIDEMENT N/A 12/05/2020  ? Procedure: Lumbar Wound Exploration;  Surgeon: Coletta Memos, MD;  Location: [facility] OR;  Service: Neurosurgery;  Laterality: N/A;  3C/RM 21  ? LUMBAR WOUND DEBRIDEMENT N/A 02/21/2021  ? Procedure: Lumbar wound exploration;  Surgeon: Coletta Memos, MD;  Location: [facility] OR;  Service: Neurosurgery;  Laterality: N/A;  posterior  ? LUMBAR WOUND DEBRIDEMENT N/A 04/11/2021  ? Procedure: Wound Exploration with Wound Vac Placement;  Surgeon: Coletta Memos, MD;  Location: [facility] OR;  Service: Neurosurgery;  Laterality: N/A;  ? ROTATOR CUFF REPAIR    ? TONSILLECTOMY    ? ? ?There were no vitals filed for this visit. ? ? Subjective Assessment - 03/26/22 1315   ? ? Subjective  Shaine C Foye returns to OT for treatment visit 7/36 to address BLE  lymphedema. Pt denies LE related pain in her legs. Pt reports she wrapped herself  over the visit interval without difficulty.   ? Pertinent History B knee OA, HTN, Obesity, MS, Spondylolisthesis-lumbar, open wound at lumbar surgical site since 4/22.   ? Limitations difficulty walking, impaired transfers, chronic OA pain in bilateral knees, non healing post surgical lumbar wound, BLE muscle weakness 2/2 MS, chronic leg swelling with minimal pain,   ? Special Tests - Stemmer bilaterally; Intake FOTO score =31/100   ? Patient Stated Goals get my legs better so I can do more   ? Pain Onset Other (comment)   6 mnths ago without precipitating event. No family hx  ? Pain Onset Other (comment)   years  ? ?  ?  ? ?  ? ? ? ? ? ? ? ? ? ? ? ? ? ? ? OT Treatments/Exercises (OP) - 03/26/22 1316   ? ?  ? ADLs  ? ADL Education Given Yes   ?  ? Manual Therapy  ? Manual Therapy Edema management   ? Manual Lymphatic Drainage (MLD) MLD to RLE/RLQ utilizing diaphragmatic breathing to access deep abdominal pathways, short neck sequence and inguinal LN, then proovided MLD from proximal  to distal addressing thigh, leg and foot. Foinished up session with short neck x 3 and deep breathing.   ? Compression Bandaging LLE multilayer compression wrap from base of toes to popliteal fossa using 1 ea 8, 10 and 12 cm wide short stretch bandage over cotton stockinette and 0.4 cm thick Rosidal foam.   ? ?  ?  ? ?  ? ? ? ? ? ? ? ? ? OT Education - 03/26/22 1317   ? ? Education Details Continued skilled Pt/caregiver education  And LE ADL training throughout visit for lymphedema self care/ home program, including compression wrapping, compression garment and device wear/care, lymphatic pumping ther ex, simple self-MLD, and skin care. Discussed initial volumetric measurements. Discussed all long term goals.   ? Person(s) Educated Patient   ? Methods Explanation;Demonstration;Handout   ? Comprehension Verbalized understanding;Returned demonstration;Need  further instruction   ? ?  ?  ? ?  ? ? ? ? ? ? OT Long Term Goals - 02/15/22 1210   ? ?  ? OT LONG TERM GOAL #1  ? Title Given this patient?s Intake score 31 /100 on the functional outcomes FOTO tool, patient will experience an increase in function of 5 points to improve basic and instrumental ADLs performance, including lymphedema self-care.   ? Baseline 31/100   ? Period Weeks   ? Status New   ? Target Date 05/16/22   ?  ? OT LONG TERM GOAL #2  ? Title Pt will demonstrate understanding of lymphedema prevention strategies by identifying and discussing 5 precautions using printed reference (modified assistance) to reduce risk of progression and to limit infection risk.   ? Baseline Max A   ? Time 4   ? Period Days   ? Status New   ? Target Date --   4th OT Rx visit  ?  ? OT LONG TERM GOAL #3  ? Title With Maximum caregiver assistance Pt will be able to apply multilayer, LUE compression wraps using gradient techniques to decrease limb volume, to limit infection risk, and to limit lymphedema progression. Pt unable to wrap legs independently. Trained caregiver must assist during visit intervals for CDT to be effective.   ? Baseline dependent - Max caregiver assistance is necessary to complete Intensive Phase of Complete Decongestive Therapy and Lymphedema self-care home program  as Pt is unable to reach his feet to apply compression wraps and garments, to inspect skin, to groom nails to perform skin care and inspection and to perform simple self-MLD. Pt agrees to arrange for consistent caregiver prior to commencing CDT.   ? Time 4   ? Period Days   ? Status New   ? Target Date --   4th OT Rx visit  ?  ? OT LONG TERM GOAL #4  ? Title With Maximum caregiver assistance between OT sessions Pt will achieve at least a 10% limb volume reduction below the knee bilaterally to return limb to more normal size and shape, to limit infection risk, to decrease pain, to improve function, and to limit lymphedema progression.   ?  Baseline dependent   ? Time 12   ? Period Weeks   ? Status New   ? Target Date 05/16/22   ?  ? OT LONG TERM GOAL #5  ? Title With caregiver assistance  Pt will achieve and sustain a least 85% compliance with all 4 LE self-care home program components throughout Intensive Phase CDT, including modified simple self-MLD, daily skin inspection and care,  lymphatic pumping the ex, 23/7 compression wraps to optimize limb volume reductions, to limit lymphedema progression and to limit further functional decline.   ? Baseline Dependent   ? Time 12   ? Period Weeks   ? Target Date 05/16/22   ? ?  ?  ? ?  ? ? ? ? ? ? ? ? Plan - 03/26/22 1406   ? ? Clinical Impression Statement Continued MLD and simultaneous skin care to RLE/ RLQ as established. Reapplied compressio9n wraps as established. Limb volume and skin condition continue to improve each visit. Pt has mastered wrapping.. Cont as epr POC.   ? OT Occupational Profile and History Detailed Assessment- Review of Records and additional review of physical, cognitive, psychosocial history related to current functional performance   ? Occupational performance deficits (Please refer to evaluation for details): ADL's;IADL's;Rest and Sleep;Leisure;Social Participation;Work   ? Rehab Potential Fair   ? Clinical Decision Making Several treatment options, min-mod task modification necessary   ? Comorbidities Affecting Occupational Performance: May have comorbidities impacting occupational performance   ? Modification or Assistance to Complete Evaluation  Min-Moderate modification of tasks or assist with assess necessary to complete eval   ? OT Frequency 2x / week   ? OT Duration 12 weeks   ? OT Treatment/Interventions Self-care/ADL training;DME and/or AE instruction;Manual lymph drainage;Compression bandaging;Therapeutic activities;Coping strategies training;Therapeutic exercise;Other (comment);Manual Therapy;Energy conservation;Patient/family education   skin care, Flexitouch trial,  fit with appropriate compression garments that are comfortable, effective and that Pt is able to don and doff using assistive devices PRN  ? Recommended Other Services " Calcium channel blockers may not direc

## 2022-03-26 NOTE — Progress Notes (Signed)
[name], [name] ([id number]) ?Visit Report for 03/26/2022 ?Arrival Information Details ?Patient Name: [name], [name] ?Date of Service: 03/26/2022 11:00 AM ?Medical Record Number: [id number] ?Patient Account Number: [id number] ?Date of Birth/Sex: 12/22/1961 (61 y.o. F) ?Treating RN: Yevonne Pax ?Primary Care Yoltzin Ransom: CardJonny Ruiz Other Clinician: ?Referring Nithila Sumners: Card, John ?Treating Zuha Dejonge/Extender: Allen Derry ?Weeks in Treatment: 23 ?Visit Information History Since Last Visit ?All ordered tests and consults were completed: No ?Patient Arrived: Ambulatory ?Added or deleted any medications: No ?Arrival Time: 11:06 ?Any new allergies or adverse reactions: No ?Accompanied By: self ?Had a fall or experienced change in No ?Transfer Assistance: None ?activities of daily living that may affect ?Patient Identification Verified: Yes ?risk of falls: ?Secondary Verification Process Completed: Yes ?Signs or symptoms of abuse/neglect since last visito No ?Patient Requires Transmission-Based Precautions: No ?Hospitalized since last visit: No ?Patient Has Alerts: No ?Implantable device outside of the clinic excluding No ?cellular tissue based products placed in the center ?since last visit: ?Has Dressing in Place as Prescribed: Yes ?Pain Present Now: Yes ?Electronic Signature(s) ?Signed: 03/26/2022 11:12:39 AM By: Yevonne Pax RN ?Entered By: Yevonne Pax on 03/26/2022 11:12:39 ?Krack, Alanii C. ([id number]) ?-------------------------------------------------------------------------------- ?Clinic Level of Care Assessment Details ?Patient Name: [name], [name] ?Date of Service: 03/26/2022 11:00 AM ?Medical Record Number: [id number] ?Patient Account Number: [id number] ?Date of Birth/Sex: [birth year] (61 y.o. F) ?Treating RN: Yevonne Pax ?Primary Care Sabriya Yono: CardJonny Ruiz Other Clinician: ?Referring Shayda Kalka: Card, John ?Treating Christinea Brizuela/Extender: Allen Derry ?Weeks in Treatment: 23 ?Clinic Level of Care Assessment  Items ?TOOL 4 Quantity Score ?X - Use when only an EandM is performed on FOLLOW-UP visit 1 0 ?ASSESSMENTS - Nursing Assessment / Reassessment ?X - Reassessment of Co-morbidities (includes updates in patient status) 1 10 ?X- 1 5 ?Reassessment of Adherence to Treatment Plan ?ASSESSMENTS - Wound and Skin Assessment / Reassessment ?X - Simple Wound Assessment / Reassessment - one wound 1 5 ?[]  - 0 ?Complex Wound Assessment / Reassessment - multiple wounds ?[]  - 0 ?Dermatologic / Skin Assessment (not related to wound area) ?ASSESSMENTS - Focused Assessment ?[]  - Circumferential Edema Measurements - multi extremities 0 ?[]  - 0 ?Nutritional Assessment / Counseling / Intervention ?[]  - 0 ?Lower Extremity Assessment (monofilament, tuning fork, pulses) ?[]  - 0 ?Peripheral Arterial Disease Assessment (using hand held doppler) ?ASSESSMENTS - Ostomy and/or Continence Assessment and Care ?[]  - Incontinence Assessment and Management 0 ?[]  - 0 ?Ostomy Care Assessment and Management (repouching, etc.) ?PROCESS - Coordination of Care ?X - Simple Patient / Family Education for ongoing care 1 15 ?[]  - 0 ?Complex (extensive) Patient / Family Education for ongoing care ?[]  - 0 ?Staff obtains Consents, Records, Test Results / Process Orders ?[]  - 0 ?Staff telephones HHA, Nursing Homes / Clarify orders / etc ?[]  - 0 ?Routine Transfer to another Facility (non-emergent condition) ?[]  - 0 ?Routine Hospital Admission (non-emergent condition) ?[]  - 0 ?New Admissions / / Ordering NPWT, Apligraf, etc. ?[]  - 0 ?Emergency Hospital Admission (emergent condition) ?X- 1 10 ?Simple Discharge Coordination ?[]  - 0 ?Complex (extensive) Discharge Coordination ?PROCESS - Special Needs ?[]  - Pediatric / Minor Patient Management 0 ?[]  - 0 ?Isolation Patient Management ?[]  - 0 ?Hearing / Language / Visual special needs ?[]  - 0 ?Assessment of Community assistance (transportation, D/C planning, etc.) ?[]  - 0 ?Additional assistance /  Altered mentation ?[]  - 0 ?Support Surface(s) Assessment (bed, cushion, seat, etc.) ?INTERVENTIONS - Wound Cleansing / Measurement ?Evinger, Ameah C. ( ) ?X- 1 5 ?Simple Wound Cleansing -  one wound ?[]  - 0 ?Complex Wound Cleansing - multiple wounds ?X- 1 5 ?Wound Imaging (photographs - any number of wounds) ?[]  - 0 ?Wound Tracing (instead of photographs) ?X- 1 5 ?Simple Wound Measurement - one wound ?[]  - 0 ?Complex Wound Measurement - multiple wounds ?INTERVENTIONS - Wound Dressings ?[]  - Small Wound Dressing one or multiple wounds 0 ?X- 1 15 ?Medium Wound Dressing one or multiple wounds ?[]  - 0 ?Large Wound Dressing one or multiple wounds ?[]  - 0 ?Application of Medications - topical ?[]  - 0 ?Application of Medications - injection ?INTERVENTIONS - Miscellaneous ?[]  - External ear exam 0 ?[]  - 0 ?Specimen Collection (cultures, biopsies, blood, body fluids, etc.) ?[]  - 0 ?Specimen(s) / Culture(s) sent or taken to Lab for analysis ?[]  - 0 ?Patient Transfer (multiple staff / / Similar devices) ?[]  - 0 ?Simple Staple / Suture removal (25 or less) ?[]  - 0 ?Complex Staple / Suture removal (26 or more) ?[]  - 0 ?Hypo / Hyperglycemic Management (close monitor of Blood Glucose) ?[]  - 0 ?Ankle / Brachial Index (ABI) - do not check if billed separately ?X- 1 5 ?Vital Signs ?Has the patient been seen at the hospital within the last three years: Yes ?Total Score: 80 ?Level Of Care: New/Established - Level ?3 ?Electronic Signature(s) ?Signed: 03/26/2022 4:01:37 PM By: RN ?Entered By: on 03/26/2022 11:32:53 ?Cantin, Fani C. ( ) ?-------------------------------------------------------------------------------- ?Encounter Discharge Information Details ?Patient Name: [name], [name] ?Date of Service: 03/26/2022 11:00 AM ?Medical Record Number: ?Patient Account Number: ?Date of Birth/Sex: 1961-04-13 (61 y.o. F) ?Treating RN: ?Primary Care  Jesiah Grismer: Card Other Clinician: ?Referring Labrittany Wechter: Card, John ?Treating Nashonda Limberg/Extender: ?Weeks in Treatment: 23 ?Encounter Discharge Information Items ?Discharge Condition: Stable ?Ambulatory Status: ?Discharge Destination: Home ?Transportation: Private Auto ?Accompanied By: self ?Schedule Follow-up Appointment: Yes ?Clinical Summary of Care: Patient Declined ?Electronic Signature(s) ?Signed: 03/26/2022 4:01:37 PM By: Yevonne Pax RN ?Entered By: Yevonne Pax on 03/26/2022 11:34:03 ?Roemer, Janus C. ([id number]) ?-------------------------------------------------------------------------------- ?Lower Extremity Assessment Details ?Patient Name: JESSIE, COWHER ?Date of Service: 03/26/2022 11:00 AM ?Medical Record Number: [id number] ?Patient Account Number: [id number] ?Date of Birth/Sex: 07/17/1961 (61 y.o. F) ?Treating RN: Yevonne Pax ?Primary Care Arlyss Weathersby: CardJonny Ruiz Other Clinician: ?Referring Doyt Castellana: Card, John ?Treating Chitara Clonch/Extender: Allen Derry ?Weeks in Treatment: 23 ?Electronic Signature(s) ?Signed: 03/26/2022 4:01:37 PM By: 03/28/2022 RN ?Entered By: Yevonne Pax on 03/26/2022 11:09:28 ?Hulsebus, Caleyah C. (03/28/2022) ?-------------------------------------------------------------------------------- ?Multi Wound Chart Details ?Patient Name: HALEEMAH, BUCKALEW ?Date of Service: 03/26/2022 11:00 AM ?Medical Record Number: 03/28/2022 ?Patient Account Number: [id number] ?Date of Birth/Sex: September 16, 1961 (61 y.o. F) ?Treating RN: (67 ?Primary Care Ranay Ketter: CardYevonne Pax Other Clinician: ?Referring Terra Aveni: Card, John ?Treating Mekaylah Klich/Extender: Jonny Ruiz ?Weeks in Treatment: 23 ?Vital Signs ?Height(in): ?Pulse(bpm): 74 ?Weight(lbs): 275 ?Blood Pressure(mmHg): 128/79 ?Body Mass Index(BMI): ?Temperature(??F): 98.1 ?Respiratory Rate(breaths/min): 18 ?Photos: [N/A:N/A] ?Wound Location: Distal, Midline Back N/A N/A ?Wounding Event: Surgical Injury N/A N/A ?Primary Etiology:  Dehisced Wound N/A N/A ?Comorbid History: Hypertension N/A N/A ?Date Acquired: 04/11/2021 N/A N/A ?Weeks of Treatment: 23 N/A N/A ?Wound Status: Open N/A N/A ?Wound Recurrence: No N/A N/A ?Measurements L x W x D (cm)

## 2022-03-27 ENCOUNTER — Ambulatory Visit: Payer: 59 | Admitting: Occupational Therapy

## 2022-03-28 DIAGNOSIS — T8131XA Disruption of external operation (surgical) wound, not elsewhere classified, initial encounter: Secondary | ICD-10-CM | POA: Diagnosis not present

## 2022-03-29 ENCOUNTER — Encounter: Payer: 59 | Admitting: Occupational Therapy

## 2022-03-29 NOTE — Progress Notes (Signed)
Dimare, Skylynne C. ([id number]) ?Visit Report for 03/28/2022 ?Physician Orders Details ?Patient Name: [name], [name] ?Date of Service: 03/28/2022 3:30 PM ?Medical Record Number: [id number] ?Patient Account Number: [id number] ?Date of Birth/Sex: [birth year] (61 y.o. F) ?Treating RN: Yevonne Pax ?Primary Care Provider: CardJonny Ruiz Other Clinician: ?Referring Provider: Card, John ?Treating Provider/Extender: Allen Derry ?Weeks in Treatment: 23 ?Verbal / Phone Orders: No ?Diagnosis Coding ?Follow-up Appointments ?o Return Appointment in 1 week. ?o Nurse Visit as needed - nurse visit on tuesday and thursday ?Home Health ?o Home Health Company: - [facility] ?o CONTINUE Home Health for wound care. May utilize formulary equivalent dressing for wound treatment orders unless ?otherwise specified. Home Health Nurse may visit PRN to address patientos wound care needs. - 3 times per week ?[facility] fax [phone] ?Bathing/ Shower/ Hygiene ?o May shower; gently cleanse wound with antibacterial soap, rinse and pat dry prior to dressing wounds ?o No tub bath. ?Anesthetic (Use 'Patient Medications' Section for Anesthetic Order Entry) ?o Lidocaine applied to wound bed ?Edema Control - Lymphedema / Segmental Compressive Device / Other ?o Elevate, Exercise Daily and Avoid Standing for Long Periods of Time. ?o Elevate legs to the level of the heart and pump ankles as often as possible ?o Elevate leg(s) parallel to the floor when sitting. ?Additional Orders / Instructions ?o Follow Nutritious Diet and Increase Protein Intake ?Wound Treatment ?Wound #1 - Back Wound Laterality: Midline, Distal ?Cleanser: Normal Saline 1 x Per Day/30 Days ?Discharge Instructions: Wash your hands with soap and water. Remove old dressing, discard into plastic bag and place into trash. ?Cleanse the wound with Normal Saline prior to applying a clean dressing using gauze sponges, not tissues or cotton balls. Do not scrub ?or use excessive force.  Pat dry using gauze sponges, not tissue or cotton balls. ?Primary Dressing: Hydrofera Blue Classic Foam Rope Dressing, 9x6 (mm/in) 1 x Per Day/30 Days ?Discharge Instructions: cut rope in 1/2 lengthwise AND LEAVE A TAIL OUT TO GRAB IT ?Secondary Dressing: (SILICONE BORDER) Zetuvit Plus SILICONE BORDER Dressing 4x4 (in/in) 1 x Per Day/30 Days ?Electronic Signature(s) ?Signed: 03/28/2022 4:23:05 PM By: [name] PA-C ?Signed: 03/29/2022 4:01:15 PM By: Yevonne Pax RN ?Entered By: Yevonne Pax on 03/28/2022 15:44:17 ?Millett, Khushbu C. ([id number]) ?-------------------------------------------------------------------------------- ?SuperBill Details ?Patient Name: [name], [name] ?Date of Service: 03/28/2022 ?Medical Record Number: [id number] ?Patient Account Number: [id number] ?Date of Birth/Sex: 07-03-1961 (61 y.o. 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

## 2022-03-29 NOTE — Progress Notes (Signed)
Lagos, Norris C. ([id number]) ?Visit Report for 03/28/2022 ?Arrival Information Details ?Patient Name: [name], [name] ?Date of Service: 03/28/2022 3:30 PM ?Medical Record Number: [id number] ?Patient Account Number: [id number] ?Date of Birth/Sex: Apr 22, 1961 (61 y.o. F) ?Treating RN: Yevonne Pax ?Primary Care Pearlie Nies: CardJonny Ruiz Other Clinician: ?Referring Nanette Wirsing: Card, John ?Treating Aulani Shipton/Extender: Allen Derry ?Weeks in Treatment: 23 ?Visit Information History Since Last Visit ?All ordered tests and consults were completed: No ?Patient Arrived: [name] ?Added or deleted any medications: No ?Arrival Time: 15:38 ?Any new allergies or adverse reactions: No ?Accompanied By: self ?Had a fall or experienced change in No ?Transfer Assistance: None ?activities of daily living that may affect ?Patient Identification Verified: Yes ?risk of falls: ?Secondary Verification Process Completed: Yes ?Signs or symptoms of abuse/neglect since last visito No ?Patient Requires Transmission-Based Precautions: No ?Hospitalized since last visit: No ?Patient Has Alerts: No ?Implantable device outside of the clinic excluding No ?cellular tissue based products placed in the center ?since last visit: ?Has Dressing in Place as Prescribed: Yes ?Pain Present Now: No ?Electronic Signature(s) ?Signed: 03/29/2022 4:01:15 PM By: Yevonne Pax RN ?Entered ByYevonne Pax on 03/28/2022 15:38:39 ?[name], [name] ([id number]) ?-------------------------------------------------------------------------------- ?Clinic Level of Care Assessment Details ?Patient Name: [name], [name] ?Date of Service: 03/28/2022 3:30 PM ?Medical Record Number: [id number] ?Patient Account Number: [id number] ?Date of Birth/Sex: 1961/08/24 (61 y.o. F) ?Treating RN: Yevonne Pax ?Primary Care Hasna Stefanik: CardJonny Ruiz Other Clinician: ?Referring Glenola Wheat: Card, John ?Treating Oaklee Sunga/Extender: Allen Derry ?Weeks in Treatment: 23 ?Clinic Level of Care Assessment Items ?TOOL 4  Quantity Score ?X - Use when only an EandM is performed on FOLLOW-UP visit 1 0 ?ASSESSMENTS - Nursing Assessment / Reassessment ?X - Reassessment of Co-morbidities (includes updates in patient status) 1 10 ?X- 1 5 ?Reassessment of Adherence to Treatment Plan ?ASSESSMENTS - Wound and Skin Assessment / Reassessment ?X - Simple Wound Assessment / Reassessment - one wound 1 5 ?[]  - 0 ?Complex Wound Assessment / Reassessment - multiple wounds ?[]  - 0 ?Dermatologic / Skin Assessment (not related to wound area) ?ASSESSMENTS - Focused Assessment ?[]  - Circumferential Edema Measurements - multi extremities 0 ?[]  - 0 ?Nutritional Assessment / Counseling / Intervention ?[]  - 0 ?Lower Extremity Assessment (monofilament, tuning fork, pulses) ?[]  - 0 ?Peripheral Arterial Disease Assessment (using hand held doppler) ?ASSESSMENTS - Ostomy and/or Continence Assessment and Care ?[]  - Incontinence Assessment and Management 0 ?[]  - 0 ?Ostomy Care Assessment and Management (repouching, etc.) ?PROCESS - Coordination of Care ?X - Simple Patient / Family Education for ongoing care 1 15 ?[]  - 0 ?Complex (extensive) Patient / Family Education for ongoing care ?X- 1 10 ?Staff obtains Consents, Records, Test Results / Process Orders ?[]  - 0 ?Staff telephones HHA, Nursing Homes / Clarify orders / etc ?[]  - 0 ?Routine Transfer to another Facility (non-emergent condition) ?[]  - 0 ?Routine Hospital Admission (non-emergent condition) ?[]  - 0 ?New Admissions / / Ordering NPWT, Apligraf, etc. ?[]  - 0 ?Emergency Hospital Admission (emergent condition) ?X- 1 10 ?Simple Discharge Coordination ?[]  - 0 ?Complex (extensive) Discharge Coordination ?PROCESS - Special Needs ?[]  - Pediatric / Minor Patient Management 0 ?[]  - 0 ?Isolation Patient Management ?[]  - 0 ?Hearing / Language / Visual special needs ?[]  - 0 ?Assessment of Community assistance (transportation, D/C planning, etc.) ?[]  - 0 ?Additional assistance / Altered  mentation ?[]  - 0 ?Support Surface(s) Assessment (bed, cushion, seat, etc.) ?INTERVENTIONS - Wound Cleansing / Measurement ?[name], [name] ( ) ?X- 1 5 ?Simple Wound Cleansing -  one wound ?[]  - 0 ?Complex Wound Cleansing - multiple wounds ?[]  - 0 ?Wound Imaging (photographs - any number of wounds) ?[]  - 0 ?Wound Tracing (instead of photographs) ?X- 1 5 ?Simple Wound Measurement - one wound ?[]  - 0 ?Complex Wound Measurement - multiple wounds ?INTERVENTIONS - Wound Dressings ?[]  - Small Wound Dressing one or multiple wounds 0 ?X- 1 15 ?Medium Wound Dressing one or multiple wounds ?[]  - 0 ?Large Wound Dressing one or multiple wounds ?[]  - 0 ?Application of Medications - topical ?[]  - 0 ?Application of Medications - injection ?INTERVENTIONS - Miscellaneous ?[]  - External ear exam 0 ?[]  - 0 ?Specimen Collection (cultures, biopsies, blood, body fluids, etc.) ?[]  - 0 ?Specimen(s) / Culture(s) sent or taken to Lab for analysis ?[]  - 0 ?Patient Transfer (multiple staff / / Similar devices) ?[]  - 0 ?Simple Staple / Suture removal (25 or less) ?[]  - 0 ?Complex Staple / Suture removal (26 or more) ?[]  - 0 ?Hypo / Hyperglycemic Management (close monitor of Blood Glucose) ?[]  - 0 ?Ankle / Brachial Index (ABI) - do not check if billed separately ?[]  - 0 ?Vital Signs ?Has the patient been seen at the hospital within the last three years: Yes ?Total Score: 80 ?Level Of Care: New/Established - ?Level 3 ?Electronic Signature(s) ?Signed: 03/29/2022 4:01:15 PM By: RN ?Entered By: on 03/28/2022 15:45:33 ?[name], [name] ( ) ?-------------------------------------------------------------------------------- ?Encounter Discharge Information Details ?Patient Name: [name], [name] ?Date of Service: 03/28/2022 3:30 PM ?Medical Record Number: ?Patient Account Number: ?Date of Birth/Sex: [birth year] (61 y.o. F) ?Treating RN: Nurse, adult ?Primary Care Taye Cato:  Card Other Clinician: ?Referring Jaline Pincock: Card, John ?Treating Rayvion Stumph/Extender: ?Weeks in Treatment: 23 ?Encounter Discharge Information Items ?Discharge Condition: Stable ?Ambulatory Status: ?Discharge Destination: Home ?Transportation: Private Auto ?Accompanied By: self ?Schedule Follow-up Appointment: Yes ?Clinical Summary of Care: Patient Declined ?Electronic Signature(s) ?Signed: 03/29/2022 4:01:15 PM By: RN ?Entered By: 03/31/2022 on 03/28/2022 15:44:56 ?[name], [name] (Yevonne Pax) ?-------------------------------------------------------------------------------- ?Wound Assessment Details ?Patient Name: [name], [name] ?Date of Service: 03/28/2022 3:30 PM ?Medical Record Number: Lowella Grip ?Patient Account Number: 03/30/2022 ?Date of Birth/Sex: Jul 06, 1961 (61 y.o. F) ?Treating RN: 09/11/1961 ?Primary Care Shanai Lartigue: Card(67 Other Clinician: ?Referring Estefanny Moler: Card, John ?Treating Yussuf Sawyers/Extender: Yevonne Pax ?Weeks in Treatment: 23 ?Wound Status ?Wound Number: 1 ?Primary Etiology: Dehisced Wound ?Wound Location: Distal, Midline Back ?Wound Status: Open ?Wounding Event: Surgical Injury ?Comorbid History: Hypertension ?Date Acquired: 04/11/2021 ?Weeks Of Treatment: 23 ?Clustered Wound: No ?Wound Measurements ?Length: (cm) 0.5 ?Width: (cm) 0.5 ?Depth: (cm) 3.4 ?Area: (cm?) 0.196 ?Volume: (cm?) 0.668 ?% Reduction in Area: -55.6% ?% Reduction in Volume: -15.6% ?Epithelialization: None ?Tunneling: No ?Undermining: No ?Wound Description ?Classification: Full Thickness Without Exposed Support Structu ?Exudate Amount: Medium ?Exudate Type: Serous ?Exudate Color: amber ?res Foul Odor After Cleansing: No ?Slough/Fibrino No ?Wound Bed ?Granulation Amount: Large (67-100%) Exposed Structure ?Granulation Quality: Red ?Fascia Exposed: No ?Necrotic Amount: None Present (0%) ?Fat Layer (Subcutaneous Tissue) Exposed: Yes ?Tendon Exposed: No ?Muscle Exposed: No ?Joint Exposed:  No ?Bone Exposed: No ?Treatment Notes ?Wound #1 (Back) Wound Laterality: Midline, Distal ?Cleanser ?Normal Saline ?Discharge Instruction: Wash your hands with soap and water. Remove old dressing, discard into plast

## 2022-04-02 ENCOUNTER — Encounter: Payer: 59 | Attending: Physician Assistant | Admitting: Physician Assistant

## 2022-04-02 ENCOUNTER — Ambulatory Visit: Payer: 59 | Attending: Geriatric Medicine | Admitting: Occupational Therapy

## 2022-04-02 DIAGNOSIS — I10 Essential (primary) hypertension: Secondary | ICD-10-CM | POA: Insufficient documentation

## 2022-04-02 DIAGNOSIS — L98422 Non-pressure chronic ulcer of back with fat layer exposed: Secondary | ICD-10-CM | POA: Insufficient documentation

## 2022-04-02 DIAGNOSIS — I890 Lymphedema, not elsewhere classified: Secondary | ICD-10-CM | POA: Insufficient documentation

## 2022-04-02 DIAGNOSIS — X58XXXA Exposure to other specified factors, initial encounter: Secondary | ICD-10-CM | POA: Diagnosis not present

## 2022-04-02 DIAGNOSIS — G35 Multiple sclerosis: Secondary | ICD-10-CM | POA: Diagnosis not present

## 2022-04-02 DIAGNOSIS — T8131XA Disruption of external operation (surgical) wound, not elsewhere classified, initial encounter: Secondary | ICD-10-CM | POA: Diagnosis present

## 2022-04-02 NOTE — Patient Instructions (Signed)

## 2022-04-02 NOTE — Progress Notes (Addendum)
[name], [name] ([id number]) ?Visit Report for 04/02/2022 ?Chief Complaint Document Details ?Patient Name: [name], [name] ?Date of Service: 04/02/2022 11:00 AM ?Medical Record Number: [id number] ?Patient Account Number: [id number] ?Date of Birth/Sex: 1961/11/12 (61 y.o. F) ?Treating RN: Yevonne Pax ?Primary Care Provider: CardJonny Ruiz Other Clinician: ?Referring Provider: Card, John ?Treating Provider/Extender: Allen Derry ?Weeks in Treatment: 24 ?Information Obtained from: Patient ?Chief Complaint ?Surgical Back Ulcer ?Electronic Signature(s) ?Signed: 04/02/2022 10:56:34 AM By: [name] PA-C ?Entered By: [name] on 04/02/2022 10:56:33 ?Vecchiarelli, Parris C. ([id number]) ?-------------------------------------------------------------------------------- ?HPI Details ?Patient Name: [name], [name] ?Date of Service: 04/02/2022 11:00 AM ?Medical Record Number: [id number] ?Patient Account Number: [id number] ?Date of Birth/Sex: Aug 29, 1961 (61 y.o. 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 This was actually broken up into 3 separate ?surgical events. The initial surgical intervention actually was on November 05, 2021 almost a year ago. Subsequently the patient went back in ?February for a seroma of the area which unfortunately required her to have a repeat surgery to go in and clean this out. And then again this ?occurred in April where she went back in and again they felt like stitches were coming out and there was an additional seroma. She was placed in ?a wound VAC initially and then subsequently as it got smaller that was  discontinued. Again right now I will see anything that I think a wound VAC ?would help with. Nonetheless she definitely has a significant depth to the wound that is going require packing. I actually believe the Hydrofera ?Blue rope would probably do quite well with this the problem is as much as it is draining she probably needs this to be changed at least every day. ?She does not really have anyone that can help with that that is the complicating scenario here. With that being said the patient does have a ?history of multiple sclerosis, hypertension, and again this surgical wound dehiscence in regard to her lumbar spine region. She did have a repeat ?MRI which was actually completed 10/09/2021. This showed that there was no significant change in the subcutaneous fluid collection/track of the ?lower lumbar region. This is extending to the level of the fascia unfortunately. This seems to go all the way from the L2 level with a track ?extending all the way to the fascia at the L4-5 level. Again this is a significant wound and there is significant drainage but does not seem to ?communicate to the spinal region as far as spinal fluid or otherwise is concerned that is good news. Nonetheless she last saw Dr. Franky Macho who is ?her neurosurgeon on 10/01/2021 that was when he ordered this last MRI she supposed to see him next week as well. With that being said he did ?not feel like there was any significant issue there but was not sure why this was not healing that is when he ordered the MRI. They were wanting ?to make sure that this was packed appropriately by home health unfortunately the main issue currently is that home health is completely out of ?the picture as the patient has exhausted all the home health that that she gets for a year. She is now in a very difficult  predicament where she ?does not have anyone to help her change the dressing and to be honest that she is not able to do it herself with the location of the  wound being ?on the midline lumbar spine region. If she does not have anyone that can help it is probably can to be necessary for her to go to a facility for ?rehab and daily dressing changes as I feel like daily changes which is much drainage that she is having is going to be necessary. ?10/23/2021 upon evaluation today patient appears to be doing decently well in regard to her back ulcer. This does seem to be draining a lot less ?than what it is been doing in the past. With that being said she still has quite a bit of drainage nonetheless. I do think that given time this should ?improve least I hope so. The good news is she does have home health coming out 3 days a week were doing it 2 days a week and she is paying ?someone we can to help. ?10/30/2021 upon evaluation today patient appears to be doing okay in regard to her back ulcer this is not draining quite as bad as it was in the ?beginning but he still has quite a bit of drainage noted. I do believe that the patient would benefit from [place] going ahead forward with attempting a ?wound VAC using the Hydrofera Blue rope to pack with and then subsequently using the VAC externally to actually suction out and help this to fill- ?in. I think this is our ideal way to try to get things cleared at this point. As it stands I am not certain that we are really making a progress that we ?want to see near with doing it in the way we are which is packing with the rope. It is a good dressing but I do think it is insufficient for total ?healing. She just seems to have too much in the way of drainage at this point unfortunately. ?11/06/2021 upon evaluation today patient unfortunately continues to have issues with her back ongoing. The good news is her MRI that was ?repeated showed signs of the size of this area in the lumbar spine region having decreased from 4 cm to 3.5 cm this is definitely not bad news at ?all. With that being said unfortunately she continues to have issues with  ongoing drainage not as severe as in the beginning but nonetheless still ?significant. I do think a wound VAC still would be a good way to go although her home health agency nurse apparently has some concerns about ?the possibility of not being able to keep a seal with this as they had struggles in the past. Nonetheless I explained to the patient that this is much ?different than what she had previous and that I really feel like it would do much better as far as getting the area taken care of without having any ?complications or issues here. I think that we should be able to maintain a seal. Nonetheless at this time I did discuss with the patient as well that ?she probably does need to have a wound VAC in order for [place] to get this moving in the right direction. ?11/13/2021 upon evaluation patient's wound bed actually showed signs of significant drainage at this time. She did see the surgeon yesterday he ?did not see anything that appeared to be infected. Nonetheless he does appear that she is continuing to have areas here that just do not seem  to ?want to seal up there MRI findings have been negative but nonetheless she continues to have is the seroma that is filling in. I do feel like we need ?to try to widen the hole so we can get at least a half of the [facility] then this will be better than nothing at this point. ?11/20/2021 upon evaluation today patient actually appears to be having less pain at this point which is good news and overall she we still do not ?have the results of the culture back yet it had to be sent out to [facility] and we do not have the result back yet. Is doing decently well in regard to ?her wound. Fortunately there does not appear to be any signs of active infection systemically nor locally at this time. ?11/27/2021 upon evaluation today patient appears to be doing well with regard to her wound all things considered there does appear to be less ?drainage than there was previous.  Fortunately I do not see any evidence of worsening of the patient is stating that she is having some issues with ?back pain. This is somewhat new. Again this I think could be related to the fact that she is hav

## 2022-04-02 NOTE — Therapy (Signed)
?[facility] ?[place], [name], [address] ?Phone: [phone]   Fax:  [phone] ? ?Occupational Therapy Treatment ? ?Patient Details  ?Name: [name] ?MRN: [id number] ?Date of Birth: Aug 09, 1961 ?Referring Provider (OT): Crystal Arringtonm FNP ? ? ?Encounter Date: 04/02/2022 ? ? OT End of Session - 04/02/22 1258   ? ? Visit Number 8   ? Number of Visits 36   ? Date for OT Re-Evaluation 05/15/22   ? OT Start Time 0100   ? OT Stop Time 0209   ? OT Time Calculation (min) 69 min   ? Activity Tolerance Patient tolerated treatment well;No increased pain   ? Behavior During Therapy [facility] for tasks assessed/performed   ? ?  ?  ? ?  ? ? ?Past Medical History:  ?Diagnosis Date  ? Arthritis   ? Back pain   ? Chronic knee pain   ? Edema of both lower extremities   ? High blood pressure   ? High cholesterol   ? Joint pain   ? Multiple sclerosis ([place])   ? Neuromuscular disorder ([place])   ? Multiple Sclerosis over 20 years  ? Obesity   ? Vitamin D deficiency   ? ? ?Past Surgical History:  ?Procedure Laterality Date  ? APPENDECTOMY    ? CESAREAN SECTION    ? HAND SURGERY    ? lumbar back surgery    ? LUMBAR WOUND DEBRIDEMENT N/A 12/05/2020  ? Procedure: Lumbar Wound Exploration;  Surgeon: Ashok Pall, MD;  Location: [place];  Service: Neurosurgery;  Laterality: N/A;  3C/RM 21  ? LUMBAR WOUND DEBRIDEMENT N/A 02/21/2021  ? Procedure: Lumbar wound exploration;  Surgeon: Ashok Pall, MD;  Location: [place];  Service: Neurosurgery;  Laterality: N/A;  posterior  ? LUMBAR WOUND DEBRIDEMENT N/A 04/11/2021  ? Procedure: Wound Exploration with Wound Vac Placement;  Surgeon: Ashok Pall, MD;  Location: [place];  Service: Neurosurgery;  Laterality: N/A;  ? ROTATOR CUFF REPAIR    ? TONSILLECTOMY    ? ? ?There were no vitals filed for this visit. ? ? Subjective Assessment - 04/02/22 1259   ? ? Subjective  [name] returns to OT for treatment visit 8/36 to address BLE  lymphedema. Pt denies LE related pain in her legs. Pt reports she wrapped herself  over the visit interval without difficulty.   ? Pertinent History B knee OA, HTN, Obesity, MS, Spondylolisthesis-lumbar, open wound at lumbar surgical site since 4/22.   ? Limitations difficulty walking, impaired transfers, chronic OA pain in bilateral knees, non healing post surgical lumbar wound, BLE muscle weakness 2/2 MS, chronic leg swelling with minimal pain,   ? Special Tests - Stemmer bilaterally; Intake FOTO score =31/100   ? Patient Stated Goals get my legs better so I can do more   ? Pain Onset Other (comment)   6 mnths ago without precipitating event. No family hx  ? Pain Onset Other (comment)   years  ? ?  ?  ? ?  ? ? ? ? ? ? ? ? ? ? ? ? ? ? ? OT Treatments/Exercises (OP) - 04/02/22 1302   ? ?  ? ADLs  ? ADL Education Given Yes   ?  ? Manual Therapy  ? Manual Therapy Edema management   ? Edema Management garment and device measurements   ? Manual Lymphatic Drainage (MLD) --   ? Compression Bandaging RLE multilayer compression wrap from base of  toes to popliteal fossa using 1 ea 8, 10 and 12 cm wide short stretch bandage over cotton stockinette and 0.4 cm thick Rosidal foam.   ? ?  ?  ? ?  ? ? ? ? ? ? ? ? ? OT Education - 04/02/22 1303   ? ? Education Details Pt edu focused on differences between custom  flat knit and off-the-shelf circular elastic compression garments.  Educated Pt re indications for both and pros and cons of each, and rational for therapist's current recommendations  Educated Pt re estimated costs, measuring and fitting process ,DME vendor's involvement and access to insurance benefits.   ? Person(s) Educated Patient   ? Methods Explanation;Demonstration;Handout   ? Comprehension Verbalized understanding;Returned demonstration;Need further instruction   ? ?  ?  ? ?  ? ? ? ? ? ? OT Long Term Goals - 02/15/22 1210   ? ?  ? OT LONG TERM GOAL #1  ? Title Given this patient?s Intake score 31 /100 on the  functional outcomes FOTO tool, patient will experience an increase in function of 5 points to improve basic and instrumental ADLs performance, including lymphedema self-care.   ? Baseline 31/100   ? Period Weeks   ? Status New   ? Target Date 05/16/22   ?  ? OT LONG TERM GOAL #2  ? Title Pt will demonstrate understanding of lymphedema prevention strategies by identifying and discussing 5 precautions using printed reference (modified assistance) to reduce risk of progression and to limit infection risk.   ? Baseline Max A   ? Time 4   ? Period Days   ? Status New   ? Target Date --   4th OT Rx visit  ?  ? OT LONG TERM GOAL #3  ? Title With Maximum caregiver assistance Pt will be able to apply multilayer, LUE compression wraps using gradient techniques to decrease limb volume, to limit infection risk, and to limit lymphedema progression. Pt unable to wrap legs independently. Trained caregiver must assist during visit intervals for CDT to be effective.   ? Baseline dependent - Max caregiver assistance is necessary to complete Intensive Phase of Complete Decongestive Therapy and Lymphedema self-care home program  as Pt is unable to reach his feet to apply compression wraps and garments, to inspect skin, to groom nails to perform skin care and inspection and to perform simple self-MLD. Pt agrees to arrange for consistent caregiver prior to commencing CDT.   ? Time 4   ? Period Days   ? Status New   ? Target Date --   4th OT Rx visit  ?  ? OT LONG TERM GOAL #4  ? Title With Maximum caregiver assistance between OT sessions Pt will achieve at least a 10% limb volume reduction below the knee bilaterally to return limb to more normal size and shape, to limit infection risk, to decrease pain, to improve function, and to limit lymphedema progression.   ? Baseline dependent   ? Time 12   ? Period Weeks   ? Status New   ? Target Date 05/16/22   ?  ? OT LONG TERM GOAL #5  ? Title With caregiver assistance  Pt will achieve and  sustain a least 85% compliance with all 4 LE self-care home program components throughout Intensive Phase CDT, including modified simple self-MLD, daily skin inspection and care, lymphatic pumping the ex, 23/7 compression wraps to optimize limb volume reductions, to limit lymphedema progression and to limit further  functional decline.   ? Baseline Dependent   ? Time 12   ? Period Weeks   ? Target Date 05/16/22   ? ?  ?  ? ?  ? ? ? ? ? ? ? ? Plan - 04/02/22 1303   ? ? Clinical Impression Statement Completed RLE anatomical measurements for custom compression knee highs fand HOS devices for RLE. Specifications for BLE, CC2 (23-32 mmHg) Elvarex classic A-D, slant open toe, oblique top edge, 2.5 sb, tricot patch at anterior ankle and T heel. Specified ccl 2 Jobst RELAX A-D for HOS to limit fibrosis formation. Applied multi layer compression wraps as established. Shift Rx to LLE after RLE garments are fitted. Cont as per POC.   ? OT Occupational Profile and History Detailed Assessment- Review of Records and additional review of physical, cognitive, psychosocial history related to current functional performance   ? Occupational performance deficits (Please refer to evaluation for details): ADL's;IADL's;Rest and Sleep;Leisure;Social Participation;Work   ? Rehab Potential Fair   ? Clinical Decision Making Several treatment options, min-mod task modification necessary   ? Comorbidities Affecting Occupational Performance: May have comorbidities impacting occupational performance   ? Modification or Assistance to Complete Evaluation  Min-Moderate modification of tasks or assist with assess necessary to complete eval   ? OT Frequency 2x / week   ? OT Duration 12 weeks   ? OT Treatment/Interventions Self-care/ADL training;DME and/or AE instruction;Manual lymph drainage;Compression bandaging;Therapeutic activities;Coping strategies training;Therapeutic exercise;Other (comment);Manual Therapy;Energy conservation;Patient/family  education   skin care, Flexitouch trial, fit with appropriate compression garments that are comfortable, effective and that Pt is able to don and doff using assistive devices PRN  ? Recommended Other Services " Ca

## 2022-04-02 NOTE — Progress Notes (Addendum)
Buzby, Mizuki C. ([id number]) ?Visit Report for 04/02/2022 ?Arrival Information Details ?Patient Name: [name], [name] ?Date of Service: 04/02/2022 11:00 AM ?Medical Record Number: [id number] ?Patient Account Number: [id number] ?Date of Birth/Sex: [birth year] (61 y.o. F) ?Treating RN: [name] ?Primary Care [name]: CardJonny [name] Other Clinician: ?Referring [name]: [name], [name] ?Treating [name]/Extender: [name] ?Weeks in Treatment: 24 ?Visit Information History Since Last Visit ?All ordered tests and consults were completed: No ?Patient Arrived: [name] ?Added or deleted any medications: No ?Arrival Time: 10:58 ?Any new allergies or adverse reactions: No ?Accompanied By: self ?Had a fall or experienced change in No ?Transfer Assistance: None ?activities of daily living that may affect ?Patient Identification Verified: Yes ?risk of falls: ?Secondary Verification Process Completed: Yes ?Signs or symptoms of abuse/neglect since last visito No ?Patient Requires Transmission-Based Precautions: No ?Hospitalized since last visit: No ?Patient Has Alerts: No ?Implantable device outside of the clinic excluding No ?cellular tissue based products placed in the center ?since last visit: ?Has Dressing in Place as Prescribed: Yes ?Pain Present Now: No ?Electronic Signature(s) ?Signed: 04/04/2022 9:23:23 AM By: [name] Pax RN ?Entered By: [name] on 04/02/2022 10:58:41 ?[name], [name] ([id number]) ?-------------------------------------------------------------------------------- ?Clinic Level of Care Assessment Details ?Patient Name: [name], [name] ?Date of Service: 04/02/2022 11:00 AM ?Medical Record Number: [id number] ?Patient Account Number: [id number] ?Date of Birth/Sex: January 14, 1961 (61 y.o. F) ?Treating RN: [name] ?Primary Care Hovanes Hymas: CardJonny [name] Other Clinician: ?Referring Kendahl Bumgardner: [name], [name] ?Treating Japhet Morgenthaler/Extender: [name] ?Weeks in Treatment: 24 ?Clinic Level of Care Assessment Items ?TOOL 4  Quantity Score ?X - Use when only an EandM is performed on FOLLOW-UP visit 1 0 ?ASSESSMENTS - Nursing Assessment / Reassessment ?X - Reassessment of Co-morbidities (includes updates in patient status) 1 10 ?X- 1 5 ?Reassessment of Adherence to Treatment Plan ?ASSESSMENTS - Wound and Skin Assessment / Reassessment ?X - Simple Wound Assessment / Reassessment - one wound 1 5 ?[]  - 0 ?Complex Wound Assessment / Reassessment - multiple wounds ?[]  - 0 ?Dermatologic / Skin Assessment (not related to wound area) ?ASSESSMENTS - Focused Assessment ?[]  - Circumferential Edema Measurements - multi extremities 0 ?[]  - 0 ?Nutritional Assessment / Counseling / Intervention ?[]  - 0 ?Lower Extremity Assessment (monofilament, tuning fork, pulses) ?[]  - 0 ?Peripheral Arterial Disease Assessment (using hand held doppler) ?ASSESSMENTS - Ostomy and/or Continence Assessment and Care ?[]  - Incontinence Assessment and Management 0 ?[]  - 0 ?Ostomy Care Assessment and Management (repouching, etc.) ?PROCESS - Coordination of Care ?X - Simple Patient / Family Education for ongoing care 1 15 ?[]  - 0 ?Complex (extensive) Patient / Family Education for ongoing care ?X- 1 10 ?Staff obtains Consents, Records, Test Results / Process Orders ?[]  - 0 ?Staff telephones HHA, Nursing Homes / Clarify orders / etc ?[]  - 0 ?Routine Transfer to another Facility (non-emergent condition) ?[]  - 0 ?Routine Hospital Admission (non-emergent condition) ?[]  - 0 ?New Admissions / / Ordering NPWT, Apligraf, etc. ?[]  - 0 ?Emergency Hospital Admission (emergent condition) ?X- 1 10 ?Simple Discharge Coordination ?[]  - 0 ?Complex (extensive) Discharge Coordination ?PROCESS - Special Needs ?[]  - Pediatric / Minor Patient Management 0 ?[]  - 0 ?Isolation Patient Management ?[]  - 0 ?Hearing / Language / Visual special needs ?[]  - 0 ?Assessment of Community assistance (transportation, D/C planning, etc.) ?[]  - 0 ?Additional assistance / Altered  mentation ?[]  - 0 ?Support Surface(s) Assessment (bed, cushion, seat, etc.) ?INTERVENTIONS - Wound Cleansing / Measurement ?Mazurowski, Horace C. ( ) ?X- 1 5 ?Simple Wound Cleansing -  one wound ?[]  - 0 ?Complex Wound Cleansing - multiple wounds ?X- 1 5 ?Wound Imaging (photographs - any number of wounds) ?[]  - 0 ?Wound Tracing (instead of photographs) ?X- 1 5 ?Simple Wound Measurement - one wound ?[]  - 0 ?Complex Wound Measurement - multiple wounds ?INTERVENTIONS - Wound Dressings ?[]  - Small Wound Dressing one or multiple wounds 0 ?X- 1 15 ?Medium Wound Dressing one or multiple wounds ?[]  - 0 ?Large Wound Dressing one or multiple wounds ?[]  - 0 ?Application of Medications - topical ?[]  - 0 ?Application of Medications - injection ?INTERVENTIONS - Miscellaneous ?[]  - External ear exam 0 ?[]  - 0 ?Specimen Collection (cultures, biopsies, blood, body fluids, etc.) ?[]  - 0 ?Specimen(s) / Culture(s) sent or taken to Lab for analysis ?[]  - 0 ?Patient Transfer (multiple staff / / Similar devices) ?[]  - 0 ?Simple Staple / Suture removal (25 or less) ?[]  - 0 ?Complex Staple / Suture removal (26 or more) ?[]  - 0 ?Hypo / Hyperglycemic Management (close monitor of Blood Glucose) ?[]  - 0 ?Ankle / Brachial Index (ABI) - do not check if billed separately ?X- 1 5 ?Vital Signs ?Has the patient been seen at the hospital within the last three years: Yes ?Total Score: 90 ?Level Of Care: New/Established - Level ?3 ?Electronic Signature(s) ?Signed: 04/04/2022 9:23:23 AM By: RN ?Entered By: on 04/02/2022 11:36:35 ?[name], [name] ( ) ?-------------------------------------------------------------------------------- ?Encounter Discharge Information Details ?Patient Name: [name], [name] ?Date of Service: 04/02/2022 11:00 AM ?Medical Record Number: ?Patient Account Number: ?Date of Birth/Sex: February 19, 1961 (61 y.o. F) ?Treating RN: ?Primary Care Blanca Carreon: [name] Other Clinician: ?Referring Kelyn Ponciano: [name], [name] ?Treating Tian Davison/Extender: ?Weeks in Treatment: 24 ?Encounter Discharge Information Items ?Discharge Condition: Stable ?Ambulatory Status: ?Discharge Destination: Home ?Transportation: Private Auto ?Accompanied By: self ?Schedule Follow-up Appointment: Yes ?Clinical Summary of Care: Patient Declined ?Electronic Signature(s) ?Signed: 04/02/2022 11:39:54 AM By: [name] Pax RN ?Entered By: [name] on 04/02/2022 11:39:54 ?[name], [name] ([id number]) ?-------------------------------------------------------------------------------- ?Lower Extremity Assessment Details ?Patient Name: [name], [name] ?Date of Service: 04/02/2022 11:00 AM ?Medical Record Number: [id number] ?Patient Account Number: [id number] ?Date of Birth/Sex: 04/12/1961 (61 y.o. F) ?Treating RN: [name] ?Primary Care Brenae Lasecki: CardJonny [name] Other Clinician: ?Referring Aalivia Mcgraw: [name], [name] ?Treating Marylon Verno/Extender: [name] ?Weeks in Treatment: 24 ?Electronic Signature(s) ?Signed: 04/04/2022 9:23:23 AM By: 06/02/2022 RN ?Entered By: [name] on 04/02/2022 11:06:03 ?[name], [name] (06/02/2022) ?-------------------------------------------------------------------------------- ?Multi Wound Chart Details ?Patient Name: [name], [name] ?Date of Service: 04/02/2022 11:00 AM ?Medical Record Number: 06/02/2022 ?Patient Account Number: [id number] ?Date of Birth/Sex: [birth year] (61 y.o. F) ?Treating RN: (67 ?Primary Care Quynn Vilchis: CardYevonne [name] Other Clinician: ?Referring Jayliana Valencia: [name], [name] ?Treating Jahleel Stroschein/Extender: [name] ?Weeks in Treatment: 24 ?Vital Signs ?Height(in): ?Pulse(bpm): 74 ?Weight(lbs): 275 ?Blood Pressure(mmHg): 128/79 ?Body Mass Index(BMI): ?Temperature(??F): 98.1 ?Respiratory Rate(breaths/min): 18 ?Photos: [1:No Photos] [N/A:N/A] ?Wound Location: [1:Distal, Midline Back] [N/A:N/A] ?Wounding Event: [1:Surgical Injury] [N/A:N/A] ?Primary  Etiology: [1:Dehisced Wound] [N/A:N/A] ?Comorbid History: [1:Hypertension] [N/A:N/A] ?Date Acquired: [1:04/11/2021] [N/A:N/A] ?Weeks of Treatment: [1:24] [N/A:N/A] ?Wound Status: [1:Open] [N/A:N/A] ?Wound Recurrence

## 2022-04-03 ENCOUNTER — Encounter: Payer: 59 | Admitting: Occupational Therapy

## 2022-04-04 DIAGNOSIS — T8131XA Disruption of external operation (surgical) wound, not elsewhere classified, initial encounter: Secondary | ICD-10-CM | POA: Diagnosis not present

## 2022-04-05 ENCOUNTER — Ambulatory Visit: Payer: 59 | Admitting: Occupational Therapy

## 2022-04-05 DIAGNOSIS — I890 Lymphedema, not elsewhere classified: Secondary | ICD-10-CM

## 2022-04-05 NOTE — Therapy (Signed)
[facility] ?[place], [name], [address] ?Phone: [phone]   Fax:  [phone] ? ?Occupational Therapy Treatment ? ?Patient Details  ?Name: [name] ?MRN: [id number] ?Date of Birth: Jan 15, 1961 ?Referring Provider (OT): Crystal Arringtonm FNP ? ? ?Encounter Date: 04/05/2022 ? ? OT End of Session - 04/05/22 1205   ? ? Visit Number 9   ? Number of Visits 36   ? Date for OT Re-Evaluation 05/15/22   ? OT Start Time 1105   ? OT Stop Time 1205   ? OT Time Calculation (min) 60 min   ? Activity Tolerance Patient tolerated treatment well;No increased pain   ? Behavior During Therapy [facility] for tasks assessed/performed   ? ?  ?  ? ?  ? ? ?Past Medical History:  ?Diagnosis Date  ? Arthritis   ? Back pain   ? Chronic knee pain   ? Edema of both lower extremities   ? High blood pressure   ? High cholesterol   ? Joint pain   ? Multiple sclerosis ([place])   ? Neuromuscular disorder ([place])   ? Multiple Sclerosis over 20 years  ? Obesity   ? Vitamin D deficiency   ? ? ?Past Surgical History:  ?Procedure Laterality Date  ? APPENDECTOMY    ? CESAREAN SECTION    ? HAND SURGERY    ? lumbar back surgery    ? LUMBAR WOUND DEBRIDEMENT N/A 12/05/2020  ? Procedure: Lumbar Wound Exploration;  Surgeon: Ashok Pall, MD;  Location: [place];  Service: Neurosurgery;  Laterality: N/A;  3C/RM 21  ? LUMBAR WOUND DEBRIDEMENT N/A 02/21/2021  ? Procedure: Lumbar wound exploration;  Surgeon: Ashok Pall, MD;  Location: [place];  Service: Neurosurgery;  Laterality: N/A;  posterior  ? LUMBAR WOUND DEBRIDEMENT N/A 04/11/2021  ? Procedure: Wound Exploration with Wound Vac Placement;  Surgeon: Ashok Pall, MD;  Location: [facility];  Service: Neurosurgery;  Laterality: N/A;  ? ROTATOR CUFF REPAIR    ? TONSILLECTOMY    ? ? ?There were no vitals filed for this visit. ? ? Subjective Assessment - 04/05/22 1210   ? ? Subjective  Marche C Mckeever returns to OT for treatment visit 9/36 to address BLE  lymphedema. Pt denies LE related pain in her legs. Pt has no new LE-related concerns.   ? Pertinent History B knee OA, HTN, Obesity, MS, Spondylolisthesis-lumbar, open wound at lumbar surgical site since 4/22.   ? Limitations difficulty walking, impaired transfers, chronic OA pain in bilateral knees, non healing post surgical lumbar wound, BLE muscle weakness 2/2 MS, chronic leg swelling with minimal pain,   ? Special Tests - [place] bilaterally; Intake FOTO score =31/100   ? Patient Stated Goals get my legs better so I can do more   ? Currently in Pain? No/denies   ? Pain Onset Other (comment)   6 mnths ago without precipitating event. No family hx  ? Pain Onset Other (comment)   years  ? ?  ?  ? ?  ? ? ? ? ? ? ? ? ? ? ? ? ? ? ? ? ? ? ? ? ? ? ? OT Education - 04/05/22 1211   ? ? Education Details Continued skilled Pt/caregiver education  And LE ADL training throughout visit for lymphedema self care/ home program, including compression wrapping, compression garment and device wear/care, lymphatic pumping ther ex, simple self-MLD, and skin care. Discussed initial volumetric measurements. Discussed all long  term goals.   ? Person(s) Educated Patient   ? Methods Explanation;Demonstration;Handout   ? Comprehension Verbalized understanding;Returned demonstration;Need further instruction   ? ?  ?  ? ?  ? ? ? ? ? ? OT Long Term Goals - 02/15/22 1210   ? ?  ? OT LONG TERM GOAL #1  ? Title Given this patient?s Intake score 31 /100 on the functional outcomes FOTO tool, patient will experience an increase in function of 5 points to improve basic and instrumental ADLs performance, including lymphedema self-care.   ? Baseline 31/100   ? Period Weeks   ? Status New   ? Target Date 05/16/22   ?  ? OT LONG TERM GOAL #2  ? Title Pt will demonstrate understanding of lymphedema prevention strategies by identifying and discussing 5 precautions using printed reference (modified assistance) to reduce risk of progression and to limit  infection risk.   ? Baseline Max A   ? Time 4   ? Period Days   ? Status New   ? Target Date --   4th OT Rx visit  ?  ? OT LONG TERM GOAL #3  ? Title With Maximum caregiver assistance Pt will be able to apply multilayer, LUE compression wraps using gradient techniques to decrease limb volume, to limit infection risk, and to limit lymphedema progression. Pt unable to wrap legs independently. Trained caregiver must assist during visit intervals for CDT to be effective.   ? Baseline dependent - Max caregiver assistance is necessary to complete Intensive Phase of Complete Decongestive Therapy and Lymphedema self-care home program  as Pt is unable to reach his feet to apply compression wraps and garments, to inspect skin, to groom nails to perform skin care and inspection and to perform simple self-MLD. Pt agrees to arrange for consistent caregiver prior to commencing CDT.   ? Time 4   ? Period Days   ? Status New   ? Target Date --   4th OT Rx visit  ?  ? OT LONG TERM GOAL #4  ? Title With Maximum caregiver assistance between OT sessions Pt will achieve at least a 10% limb volume reduction below the knee bilaterally to return limb to more normal size and shape, to limit infection risk, to decrease pain, to improve function, and to limit lymphedema progression.   ? Baseline dependent   ? Time 12   ? Period Weeks   ? Status New   ? Target Date 05/16/22   ?  ? OT LONG TERM GOAL #5  ? Title With caregiver assistance  Pt will achieve and sustain a least 85% compliance with all 4 LE self-care home program components throughout Intensive Phase CDT, including modified simple self-MLD, daily skin inspection and care, lymphatic pumping the ex, 23/7 compression wraps to optimize limb volume reductions, to limit lymphedema progression and to limit further functional decline.   ? Baseline Dependent   ? Time 12   ? Period Weeks   ? Target Date 05/16/22   ? ?  ?  ? ?  ? ? ? ? ? ? ? ? Plan - 04/05/22 1212   ? ? Clinical Impression  Statement Provided MLD and simultaneous skin care to RLE/ RLQ as established. Reapplied compressio9n wraps as established. Limb volume and skin condition continue to improve each visit. Pt remains compliant with aspects of home program she has learned thus far. Measure RLE limb volumes next voisit and consider completing RLE garment measurements. Cont as epr  POC.   ? OT Occupational Profile and History Detailed Assessment- Review of Records and additional review of physical, cognitive, psychosocial history related to current functional performance   ? Occupational performance deficits (Please refer to evaluation for details): ADL's;IADL's;Rest and Sleep;Leisure;Social Participation;Work   ? Rehab Potential Fair   ? Clinical Decision Making Several treatment options, min-mod task modification necessary   ? Comorbidities Affecting Occupational Performance: May have comorbidities impacting occupational performance   ? Modification or Assistance to Complete Evaluation  Min-Moderate modification of tasks or assist with assess necessary to complete eval   ? OT Frequency 2x / week   ? OT Duration 12 weeks   ? OT Treatment/Interventions Self-care/ADL training;DME and/or AE instruction;Manual lymph drainage;Compression bandaging;Therapeutic activities;Coping strategies training;Therapeutic exercise;Other (comment);Manual Therapy;Energy conservation;Patient/family education   skin care, Flexitouch trial, fit with appropriate compression garments that are comfortable, effective and that Pt is able to don and doff using assistive devices PRN  ? Recommended Other Services " Calcium channel blockers may not directly impair lymphatic function. However our results show that a reduced lymphatic function predisposes to CBC edema, which may explain why some patients develop edema during treatment." [employer], Niklas Telinius, Claiborne Billings, and Vibeke Hjortdal.  Reduced Lymphatic Function Predisposes to Calcium Channel  Blocker Edema: A Randomized Placebo-Controlled Clinical Trial.  Lymphatic Research and Biology.Apr [email or web address]  Published in Volume: 18 Issue 2: April 16, 2019  Online Ahead

## 2022-04-05 NOTE — Patient Instructions (Signed)

## 2022-04-09 ENCOUNTER — Encounter: Payer: 59 | Admitting: Physician Assistant

## 2022-04-09 ENCOUNTER — Ambulatory Visit: Payer: 59 | Admitting: Occupational Therapy

## 2022-04-09 DIAGNOSIS — T8131XA Disruption of external operation (surgical) wound, not elsewhere classified, initial encounter: Secondary | ICD-10-CM | POA: Diagnosis not present

## 2022-04-09 NOTE — Progress Notes (Addendum)
Marceaux, Darneisha C. ([id number]) ?Visit Report for 04/09/2022 ?Chief Complaint Document Details ?Patient Name: ANAIS, MCGROARTY ?Date of Service: 04/09/2022 11:00 AM ?Medical Record Number: [id number] ?Patient Account Number: [id number] ?Date of Birth/Sex: 04/19/1961 (61 y.o. F) ?Treating RN: Carlene Coria ?Primary Care Provider: CardJenny Reichmann Other Clinician: ?Referring Provider: Card, John ?Treating Provider/Extender: Jeri Cos ?Weeks in Treatment: 25 ?Information Obtained from: Patient ?Chief Complaint ?Surgical Back Ulcer ?Electronic Signature(s) ?Signed: 04/09/2022 10:58:54 AM By: Worthy Keeler PA-C ?Entered By: Worthy Keeler on 04/09/2022 10:58:53 ?Goldinger, Bethany C. ([id number]) ?-------------------------------------------------------------------------------- ?HPI Details ?Patient Name: EDYE, MOHABIR ?Date of Service: 04/09/2022 11:00 AM ?Medical Record Number: [id number] ?Patient Account Number: [id number] ?Date of Birth/Sex: 1961-10-25 (61 y.o. F) ?Treating RN: Carlene Coria ?Primary Care Provider: CardJenny Reichmann Other Clinician: ?Referring Provider: Card, John ?Treating Provider/Extender: Jeri Cos ?Weeks in Treatment: 25 ?History of Present Illness ?HPI Description: 10/15/2021 upon evaluation today patient presents for initial evaluation here in the clinic concerning a surgical ?ulceration/dehiscence in the lumbar spine region following surgery that she had over the past year. This was actually broken up into 3 separate ?surgical events. The initial surgical intervention actually was on November 05, 2021 almost a year ago. Subsequently the patient went back in ?February for a seroma of the area which unfortunately required her to have a repeat surgery to go in and clean this out. And then again this ?occurred in April where she went back in and again they felt like stitches were coming out and there was an additional seroma. She was placed in ?a wound VAC initially and then subsequently as it got smaller that  was discontinued. Again right now I will see anything that I think a wound VAC ?would help with. Nonetheless she definitely has a significant depth to the wound that is going require packing. I actually believe the Hydrofera ?Blue rope would probably do quite well with this the problem is as much as it is draining she probably needs this to be changed at least every day. ?She does not really have anyone that can help with that that is the complicating scenario here. With that being said the patient does have a ?history of multiple sclerosis, hypertension, and again this surgical wound dehiscence in regard to her lumbar spine region. She did have a repeat ?MRI which was actually completed 10/09/2021. This showed that there was no significant change in the subcutaneous fluid collection/track of the ?lower lumbar region. This is extending to the level of the fascia unfortunately. This seems to go all the way from the L2 level with a track ?extending all the way to the fascia at the L4-5 level. Again this is a significant wound and there is significant drainage but does not seem to ?communicate to the spinal region as far as spinal fluid or otherwise is concerned that is good news. Nonetheless she last saw Dr. Christella Noa who is ?her neurosurgeon on 10/01/2021 that was when he ordered this last MRI she supposed to see him next week as well. With that being said he did ?not feel like there was any significant issue there but was not sure why this was not healing that is when he ordered the MRI. They were wanting ?to make sure that this was packed appropriately by home health unfortunately the main issue currently is that home health is completely out of ?the picture as the patient has exhausted all the home health that that she gets for a year. She is now in a very difficult  predicament where she ?does not have anyone to help her change the dressing and to be honest that she is not able to do it herself with the location of  the wound being ?on the midline lumbar spine region. If she does not have anyone that can help it is probably can to be necessary for her to go to a facility for ?rehab and daily dressing changes as I feel like daily changes which is much drainage that she is having is going to be necessary. ?10/23/2021 upon evaluation today patient appears to be doing decently well in regard to her back ulcer. This does seem to be draining a lot less ?than what it is been doing in the past. With that being said she still has quite a bit of drainage nonetheless. I do think that given time this should ?improve least I hope so. The good news is she does have home health coming out 3 days a week were doing it 2 days a week and she is paying ?someone we can to help. ?10/30/2021 upon evaluation today patient appears to be doing okay in regard to her back ulcer this is not draining quite as bad as it was in the ?beginning but he still has quite a bit of drainage noted. I do believe that the patient would benefit from [place] going ahead forward with attempting a ?wound VAC using the Hydrofera Blue rope to pack with and then subsequently using the VAC externally to actually suction out and help this to fill- ?in. I think this is our ideal way to try to get things cleared at this point. As it stands I am not certain that we are really making a progress that we ?want to see near with doing it in the way we are which is packing with the rope. It is a good dressing but I do think it is insufficient for total ?healing. She just seems to have too much in the way of drainage at this point unfortunately. ?11/06/2021 upon evaluation today patient unfortunately continues to have issues with her back ongoing. The good news is her MRI that was ?repeated showed signs of the size of this area in the lumbar spine region having decreased from 4 cm to 3.5 cm this is definitely not bad news at ?all. With that being said unfortunately she continues to have issues  with ongoing drainage not as severe as in the beginning but nonetheless still ?significant. I do think a wound VAC still would be a good way to go although her home health agency nurse apparently has some concerns about ?the possibility of not being able to keep a seal with this as they had struggles in the past. Nonetheless I explained to the patient that this is much ?different than what she had previous and that I really feel like it would do much better as far as getting the area taken care of without having any ?complications or issues here. I think that we should be able to maintain a seal. Nonetheless at this time I did discuss with the patient as well that ?she probably does need to have a wound VAC in order for [place] to get this moving in the right direction. ?11/13/2021 upon evaluation patient's wound bed actually showed signs of significant drainage at this time. She did see the surgeon yesterday he ?did not see anything that appeared to be infected. Nonetheless he does appear that she is continuing to have areas here that just do not seem  to ?want to seal up there MRI findings have been negative but nonetheless she continues to have is the seroma that is filling in. I do feel like we need ?to try to widen the hole so we can get at least a half of the [facility] then this will be better than nothing at this point. ?11/20/2021 upon evaluation today patient actually appears to be having less pain at this point which is good news and overall she we still do not ?have the results of the culture back yet it had to be sent out to [facility] and we do not have the result back yet. Is doing decently well in regard to ?her wound. Fortunately there does not appear to be any signs of active infection systemically nor locally at this time. ?11/27/2021 upon evaluation today patient appears to be doing well with regard to her wound all things considered there does appear to be less ?drainage than there was previous.  Fortunately I do not see any evidence of worsening of the patient is stating that she is having some issues with ?back pain. This is somewhat new. Again this I think could be related to the fact that she is

## 2022-04-09 NOTE — Progress Notes (Signed)
[name], [name] ([id number]) ?Visit Report for 04/04/2022 ?Arrival Information Details ?Patient Name: [name], [name] ?Date of Service: 04/04/2022 3:30 PM ?Medical Record Number: [id number] ?Patient Account Number: [id number] ?Date of Birth/Sex: 1961/10/21 (61 y.o. F) ?Treating RN: Yevonne Pax ?Primary Care Ellamay [name]: CardJonny Ruiz Other Clinician: ?Referring Liesel Peckenpaugh: Card, John ?Treating Drayce Tawil/Extender: Allen Derry ?Weeks in Treatment: 24 ?Visit Information History Since Last Visit ?All ordered tests and consults were completed: No ?Patient Arrived: [name] ?Added or deleted any medications: No ?Arrival Time: 15:44 ?Any new allergies or adverse reactions: No ?Accompanied By: self ?Had a fall or experienced change in No ?Transfer Assistance: None ?activities of daily living that may affect ?Patient Identification Verified: Yes ?risk of falls: ?Secondary Verification Process Completed: Yes ?Signs or symptoms of abuse/neglect since last visito No ?Patient Requires Transmission-Based Precautions: No ?Hospitalized since last visit: No ?Patient Has Alerts: No ?Implantable device outside of the clinic excluding No ?cellular tissue based products placed in the center ?since last visit: ?Has Dressing in Place as Prescribed: Yes ?Pain Present Now: No ?Electronic Signature(s) ?Signed: 04/09/2022 8:57:31 AM By: Yevonne Pax RN ?Entered By: Yevonne Pax on 04/04/2022 15:54:31 ?[name], [name] ([id number]) ?-------------------------------------------------------------------------------- ?Clinic Level of Care Assessment Details ?Patient Name: [name], [name] ?Date of Service: 04/04/2022 3:30 PM ?Medical Record Number: [id number] ?Patient Account Number: [id number] ?Date of Birth/Sex: 16-Jul-1961 (61 y.o. F) ?Treating RN: Yevonne Pax ?Primary Care Adira Limburg: CardJonny Ruiz Other Clinician: ?Referring Isobella Ascher: Card, John ?Treating Zuha Dejonge/Extender: Allen Derry ?Weeks in Treatment: 24 ?Clinic Level of Care Assessment Items ?TOOL 4  Quantity Score ?X - Use when only an EandM is performed on FOLLOW-UP visit 1 0 ?ASSESSMENTS - Nursing Assessment / Reassessment ?X - Reassessment of Co-morbidities (includes updates in patient status) 1 10 ?X- 1 5 ?Reassessment of Adherence to Treatment Plan ?ASSESSMENTS - Wound and Skin Assessment / Reassessment ?X - Simple Wound Assessment / Reassessment - one wound 1 5 ?[]  - 0 ?Complex Wound Assessment / Reassessment - multiple wounds ?[]  - 0 ?Dermatologic / Skin Assessment (not related to wound area) ?ASSESSMENTS - Focused Assessment ?[]  - Circumferential Edema Measurements - multi extremities 0 ?[]  - 0 ?Nutritional Assessment / Counseling / Intervention ?[]  - 0 ?Lower Extremity Assessment (monofilament, tuning fork, pulses) ?[]  - 0 ?Peripheral Arterial Disease Assessment (using hand held doppler) ?ASSESSMENTS - Ostomy and/or Continence Assessment and Care ?[]  - Incontinence Assessment and Management 0 ?[]  - 0 ?Ostomy Care Assessment and Management (repouching, etc.) ?PROCESS - Coordination of Care ?X - Simple Patient / Family Education for ongoing care 1 15 ?[]  - 0 ?Complex (extensive) Patient / Family Education for ongoing care ?[]  - 0 ?Staff obtains Consents, Records, Test Results / Process Orders ?[]  - 0 ?Staff telephones HHA, Nursing Homes / Clarify orders / etc ?[]  - 0 ?Routine Transfer to another Facility (non-emergent condition) ?[]  - 0 ?Routine Hospital Admission (non-emergent condition) ?[]  - 0 ?New Admissions / / Ordering NPWT, Apligraf, etc. ?[]  - 0 ?Emergency Hospital Admission (emergent condition) ?X- 1 10 ?Simple Discharge Coordination ?[]  - 0 ?Complex (extensive) Discharge Coordination ?PROCESS - Special Needs ?[]  - Pediatric / Minor Patient Management 0 ?[]  - 0 ?Isolation Patient Management ?[]  - 0 ?Hearing / Language / Visual special needs ?[]  - 0 ?Assessment of Community assistance (transportation, D/C planning, etc.) ?[]  - 0 ?Additional assistance / Altered  mentation ?[]  - 0 ?Support Surface(s) Assessment (bed, cushion, seat, etc.) ?INTERVENTIONS - Wound Cleansing / Measurement ?[name], [name] ( ) ?X- 1 5 ?Simple Wound Cleansing -  one wound ?[]  - 0 ?Complex Wound Cleansing - multiple wounds ?[]  - 0 ?Wound Imaging (photographs - any number of wounds) ?[]  - 0 ?Wound Tracing (instead of photographs) ?X- 1 5 ?Simple Wound Measurement - one wound ?[]  - 0 ?Complex Wound Measurement - multiple wounds ?INTERVENTIONS - Wound Dressings ?[]  - Small Wound Dressing one or multiple wounds 0 ?X- 1 15 ?Medium Wound Dressing one or multiple wounds ?[]  - 0 ?Large Wound Dressing one or multiple wounds ?[]  - 0 ?Application of Medications - topical ?[]  - 0 ?Application of Medications - injection ?INTERVENTIONS - Miscellaneous ?[]  - External ear exam 0 ?[]  - 0 ?Specimen Collection (cultures, biopsies, blood, body fluids, etc.) ?[]  - 0 ?Specimen(s) / Culture(s) sent or taken to Lab for analysis ?[]  - 0 ?Patient Transfer (multiple staff / / Similar devices) ?[]  - 0 ?Simple Staple / Suture removal (25 or less) ?[]  - 0 ?Complex Staple / Suture removal (26 or more) ?[]  - 0 ?Hypo / Hyperglycemic Management (close monitor of Blood Glucose) ?[]  - 0 ?Ankle / Brachial Index (ABI) - do not check if billed separately ?[]  - 0 ?Vital Signs ?Has the patient been seen at the hospital within the last three years: Yes ?Total Score: 70 ?Level Of Care: New/Established - ?Level 2 ?Electronic Signature(s) ?Signed: 04/09/2022 8:57:31 AM By: RN ?Entered By: on 04/04/2022 16:12:18 ?[name], [name] ( ) ?-------------------------------------------------------------------------------- ?Encounter Discharge Information Details ?Patient Name: [name], [name] ?Date of Service: 04/04/2022 3:30 PM ?Medical Record Number: ?Patient Account Number: ?Date of Birth/Sex: 03/01/1961 (61 y.o. F) ?Treating RN: Nurse, adult ?Primary Care Glori Machnik: Card Other Clinician: ?Referring Tamiya Colello: Card, John ?Treating Almadelia Looman/Extender: ?Weeks in Treatment: 24 ?Encounter Discharge Information Items ?Discharge Condition: Stable ?Ambulatory Status: ?Discharge Destination: Home ?Transportation: Private Auto ?Accompanied By: self ?Schedule Follow-up Appointment: Yes ?Clinical Summary of Care: Patient Declined ?Electronic Signature(s) ?Signed: 04/09/2022 8:57:31 AM By: RN ?Entered By: 06/09/2022 on 04/04/2022 15:59:56 ?[name], [name] (Yevonne Pax) ?-------------------------------------------------------------------------------- ?Wound Assessment Details ?Patient Name: [name], [name] ?Date of Service: 04/04/2022 3:30 PM ?Medical Record Number: Lowella Grip ?Patient Account Number: 06/04/2022 ?Date of Birth/Sex: 12-25-1961 (61 y.o. F) ?Treating RN: 09/11/1961 ?Primary Care Zenita Kister: Card(67 Other Clinician: ?Referring Yuki Purves: Card, John ?Treating Zadrian Mccauley/Extender: Yevonne Pax ?Weeks in Treatment: 24 ?Wound Status ?Wound Number: 1 ?Primary Etiology: Dehisced Wound ?Wound Location: Distal, Midline Back ?Wound Status: Open ?Wounding Event: Surgical Injury ?Comorbid History: Hypertension ?Date Acquired: 04/11/2021 ?Weeks Of Treatment: 24 ?Clustered Wound: No ?Wound Measurements ?Length: (cm) 0.5 ?Width: (cm) 0.5 ?Depth: (cm) 3.4 ?Area: (cm?) 0.196 ?Volume: (cm?) 0.668 ?% Reduction in Area: -55.6% ?% Reduction in Volume: -15.6% ?Epithelialization: None ?Tunneling: No ?Undermining: No ?Wound Description ?Classification: Full Thickness Without Exposed Support Structu ?Exudate Amount: Medium ?Exudate Type: Serous ?Exudate Color: amber ?res Foul Odor After Cleansing: No ?Slough/Fibrino No ?Wound Bed ?Granulation Amount: Large (67-100%) Exposed Structure ?Granulation Quality: Red ?Fascia Exposed: No ?Necrotic Amount: None Present (0%) ?Fat Layer (Subcutaneous Tissue) Exposed: Yes ?Tendon Exposed: No ?Muscle Exposed: No ?Joint Exposed: No ?Bone  Exposed: No ?Treatment Notes ?Wound #1 (Back) Wound Laterality: Midline, Distal ?Cleanser ?Normal Saline ?Discharge Instruction: Wash your hands with soap and water. Remove old dressing, discard into plastic bag

## 2022-04-09 NOTE — Progress Notes (Addendum)
Bhalla, Alishba C. ([id number]) ?Visit Report for 04/09/2022 ?Arrival Information Details ?Patient Name: [name], [name] ?Date of Service: 04/09/2022 11:00 AM ?Medical Record Number: [id number] ?Patient Account Number: [id number] ?Date of Birth/Sex: [birth year] (61 y.o. F) ?Treating RN: Carlene Coria ?Primary Care Joani Cosma: CardJenny Reichmann Other Clinician: ?Referring Retal Tonkinson: Card, John ?Treating Krystalyn Kubota/Extender: Jeri Cos ?Weeks in Treatment: 25 ?Visit Information History Since Last Visit ?All ordered tests and consults were completed: No ?Patient Arrived: Ambulatory ?Added or deleted any medications: No ?Arrival Time: 11:02 ?Any new allergies or adverse reactions: No ?Accompanied By: self ?Had a fall or experienced change in No ?Transfer Assistance: None ?activities of daily living that may affect ?Patient Identification Verified: Yes ?risk of falls: ?Secondary Verification Process Completed: Yes ?Signs or symptoms of abuse/neglect since last visito No ?Patient Requires Transmission-Based Precautions: No ?Hospitalized since last visit: No ?Patient Has Alerts: No ?Implantable device outside of the clinic excluding No ?cellular tissue based products placed in the center ?since last visit: ?Has Dressing in Place as Prescribed: Yes ?Pain Present Now: No ?Electronic Signature(s) ?Signed: 04/16/2022 9:16:22 AM By: Carlene Coria RN ?Entered By: Carlene Coria on 04/09/2022 11:03:20 ?Shahin, Shelagh C. ([id number]) ?-------------------------------------------------------------------------------- ?Clinic Level of Care Assessment Details ?Patient Name: [name], [name] ?Date of Service: 04/09/2022 11:00 AM ?Medical Record Number: [id number] ?Patient Account Number: [id number] ?Date of Birth/Sex: 1961-02-07 (61 y.o. F) ?Treating RN: Carlene Coria ?Primary Care Lanika Colgate: CardJenny Reichmann Other Clinician: ?Referring Lalo Tromp: Card, John ?Treating Dreux Mcgroarty/Extender: Jeri Cos ?Weeks in Treatment: 25 ?Clinic Level of Care Assessment  Items ?TOOL 4 Quantity Score ?X - Use when only an EandM is performed on FOLLOW-UP visit 1 0 ?ASSESSMENTS - Nursing Assessment / Reassessment ?X - Reassessment of Co-morbidities (includes updates in patient status) 1 10 ?X- 1 5 ?Reassessment of Adherence to Treatment Plan ?ASSESSMENTS - Wound and Skin Assessment / Reassessment ?X - Simple Wound Assessment / Reassessment - one wound 1 5 ?[]  - 0 ?Complex Wound Assessment / Reassessment - multiple wounds ?[]  - 0 ?Dermatologic / Skin Assessment (not related to wound area) ?ASSESSMENTS - Focused Assessment ?[]  - Circumferential Edema Measurements - multi extremities 0 ?[]  - 0 ?Nutritional Assessment / Counseling / Intervention ?[]  - 0 ?Lower Extremity Assessment (monofilament, tuning fork, pulses) ?[]  - 0 ?Peripheral Arterial Disease Assessment (using hand held doppler) ?ASSESSMENTS - Ostomy and/or Continence Assessment and Care ?[]  - Incontinence Assessment and Management 0 ?[]  - 0 ?Ostomy Care Assessment and Management (repouching, etc.) ?PROCESS - Coordination of Care ?X - Simple Patient / Family Education for ongoing care 1 15 ?[]  - 0 ?Complex (extensive) Patient / Family Education for ongoing care ?[]  - 0 ?Staff obtains Consents, Records, Test Results / Process Orders ?[]  - 0 ?Staff telephones HHA, Nursing Homes / Clarify orders / etc ?[]  - 0 ?Routine Transfer to another Facility (non-emergent condition) ?[]  - 0 ?Routine Hospital Admission (non-emergent condition) ?[]  - 0 ?New Admissions / Biomedical engineer / Ordering NPWT, Apligraf, etc. ?[]  - 0 ?Emergency Hospital Admission (emergent condition) ?X- 1 10 ?Simple Discharge Coordination ?[]  - 0 ?Complex (extensive) Discharge Coordination ?PROCESS - Special Needs ?[]  - Pediatric / Minor Patient Management 0 ?[]  - 0 ?Isolation Patient Management ?[]  - 0 ?Hearing / Language / Visual special needs ?[]  - 0 ?Assessment of Community assistance (transportation, D/C planning, etc.) ?[]  - 0 ?Additional assistance /  Altered mentation ?[]  - 0 ?Support Surface(s) Assessment (bed, cushion, seat, etc.) ?INTERVENTIONS - Wound Cleansing / Measurement ?Bozard, [name] ([id number]) ?X- 1 5 ?Simple Wound Cleansing -  one wound ?[]  - 0 ?Complex Wound Cleansing - multiple wounds ?X- 1 5 ?Wound Imaging (photographs - any number of wounds) ?[]  - 0 ?Wound Tracing (instead of photographs) ?X- 1 5 ?Simple Wound Measurement - one wound ?[]  - 0 ?Complex Wound Measurement - multiple wounds ?INTERVENTIONS - Wound Dressings ?[]  - Small Wound Dressing one or multiple wounds 0 ?X- 1 15 ?Medium Wound Dressing one or multiple wounds ?[]  - 0 ?Large Wound Dressing one or multiple wounds ?[]  - 0 ?Application of Medications - topical ?[]  - 0 ?Application of Medications - injection ?INTERVENTIONS - Miscellaneous ?[]  - External ear exam 0 ?[]  - 0 ?Specimen Collection (cultures, biopsies, blood, body fluids, etc.) ?[]  - 0 ?Specimen(s) / Culture(s) sent or taken to Lab for analysis ?[]  - 0 ?Patient Transfer (multiple staff / [employer] fast streamer / Similar devices) ?[]  - 0 ?Simple Staple / Suture removal (25 or less) ?[]  - 0 ?Complex Staple / Suture removal (26 or more) ?[]  - 0 ?Hypo / Hyperglycemic Management (close monitor of Blood Glucose) ?[]  - 0 ?Ankle / Brachial Index (ABI) - do not check if billed separately ?X- 1 5 ?Vital Signs ?Has the patient been seen at the hospital within the last three years: Yes ?Total Score: 80 ?Level Of Care: New/Established - Level ?3 ?Electronic Signature(s) ?Signed: 04/16/2022 9:16:22 AM By: Carlene Coria RN ?Entered By: Carlene Coria on 04/09/2022 11:38:06 ?Mettler, [name] ([id number]) ?-------------------------------------------------------------------------------- ?Encounter Discharge Information Details ?Patient Name: [name], [name] ?Date of Service: 04/09/2022 11:00 AM ?Medical Record Number: [id number] ?Patient Account Number: [id number] ?Date of Birth/Sex: 1961-08-21 (61 y.o. F) ?Treating RN: Carlene Coria ?Primary Care  Zafir Schauer: CardJenny Reichmann Other Clinician: ?Referring Ieesha Abbasi: Card, John ?Treating Liannah Yarbough/Extender: Jeri Cos ?Weeks in Treatment: 25 ?Encounter Discharge Information Items ?Discharge Condition: Stable ?Ambulatory Status: [name] ?Discharge Destination: Home ?Transportation: Private Auto ?Accompanied By: self ?Schedule Follow-up Appointment: Yes ?Clinical Summary of Care: Patient Declined ?Electronic Signature(s) ?Signed: 04/09/2022 12:00:11 PM By: Carlene Coria RN ?Previous Signature: 04/09/2022 11:40:34 AM Version By: Carlene Coria RN ?Entered By: Carlene Coria on 04/09/2022 12:00:11 ?[name], [name] ([id number]) ?-------------------------------------------------------------------------------- ?Lower Extremity Assessment Details ?Patient Name: [name], [name] ?Date of Service: 04/09/2022 11:00 AM ?Medical Record Number: [id number] ?Patient Account Number: [id number] ?Date of Birth/Sex: 1961-11-14 (61 y.o. F) ?Treating RN: Carlene Coria ?Primary Care Arta Stump: CardJenny Reichmann Other Clinician: ?Referring Kenny Rea: Card, John ?Treating Alaisa Moffitt/Extender: Jeri Cos ?Weeks in Treatment: 25 ?Electronic Signature(s) ?Signed: 04/16/2022 9:16:22 AM By: Carlene Coria RN ?Entered By: Carlene Coria on 04/09/2022 11:11:10 ?[name], [name] ([id number]) ?-------------------------------------------------------------------------------- ?Multi Wound Chart Details ?Patient Name: CLELIA, SCHWAN ?Date of Service: 04/09/2022 11:00 AM ?Medical Record Number: [id number] ?Patient Account Number: [id number] ?Date of Birth/Sex: February 05, 1961 (61 y.o. F) ?Treating RN: Carlene Coria ?Primary Care Colt Martelle: CardJenny Reichmann Other Clinician: ?Referring Eveleen Mcnear: Card, John ?Treating Rigley Niess/Extender: Jeri Cos ?Weeks in Treatment: 25 ?Vital Signs ?Height(in): ?Pulse(bpm): 74 ?Weight(lbs): 275 ?Blood Pressure(mmHg): 128/79 ?Body Mass Index(BMI): ?Temperature(??F): 98.1 ?Respiratory Rate(breaths/min): 18 ?Photos: [N/A:N/A] ?Wound Location: Distal, Midline  Back N/A N/A ?Wounding Event: Surgical Injury N/A N/A ?Primary Etiology: Dehisced Wound N/A N/A ?Comorbid History: Hypertension N/A N/A ?Date Acquired: 04/11/2021 N/A N/A ?Weeks of Treatment: 25 N/A N/A ?Wound Status: Op

## 2022-04-09 NOTE — Progress Notes (Signed)
[name], [name] ([id number]) ?Visit Report for 04/04/2022 ?Physician Orders Details ?Patient Name: [name], [name] ?Date of Service: 04/04/2022 3:30 PM ?Medical Record Number: [id number] ?Patient Account Number: [id number] ?Date of Birth/Sex: 08-19-1961 (61 y.o. F) ?Treating RN: Yevonne Pax ?Primary Care Provider: CardJonny Ruiz Other Clinician: ?Referring Provider: Card, John ?Treating Provider/Extender: Allen Derry ?Weeks in Treatment: 24 ?Verbal / Phone Orders: No ?Diagnosis Coding ?Follow-up Appointments ?o Return Appointment in 1 week. ?o Nurse Visit as needed - nurse visit on tuesday and thursday ?Home Health ?o Home Health Company: - [facility] ?o CONTINUE Home Health for wound care. May utilize formulary equivalent dressing for wound treatment orders unless ?otherwise specified. Home Health Nurse may visit PRN to address patientos wound care needs. - 3 times per week ?[facility] fax [phone] ?Bathing/ Shower/ Hygiene ?o May shower; gently cleanse wound with antibacterial soap, rinse and pat dry prior to dressing wounds ?o No tub bath. ?Anesthetic (Use 'Patient Medications' Section for Anesthetic Order Entry) ?o Lidocaine applied to wound bed ?Edema Control - Lymphedema / Segmental Compressive Device / Other ?o Elevate, Exercise Daily and Avoid Standing for Long Periods of Time. ?o Elevate legs to the level of the heart and pump ankles as often as possible ?o Elevate leg(s) parallel to the floor when sitting. ?Additional Orders / Instructions ?o Follow Nutritious Diet and Increase Protein Intake ?Wound Treatment ?Wound #1 - Back Wound Laterality: Midline, Distal ?Cleanser: Normal Saline 1 x Per Day/30 Days ?Discharge Instructions: Wash your hands with soap and water. Remove old dressing, discard into plastic bag and place into trash. ?Cleanse the wound with Normal Saline prior to applying a clean dressing using gauze sponges, not tissues or cotton balls. Do not scrub ?or use excessive force.  Pat dry using gauze sponges, not tissue or cotton balls. ?Secondary Dressing: (SILICONE BORDER) Zetuvit Plus SILICONE BORDER Dressing 4x4 (in/in) 1 x Per Day/30 Days ?Electronic Signature(s) ?Signed: 04/05/2022 5:15:24 PM By: [name] PA-C ?Signed: 04/09/2022 8:57:31 AM By: Yevonne Pax RN ?Entered By: Yevonne Pax on 04/04/2022 15:58:46 ?[name], [name] ([id number]) ?-------------------------------------------------------------------------------- ?SuperBill Details ?Patient Name: [name], [name] ?Date of Service: 04/04/2022 ?Medical Record Number: [id number] ?Patient Account Number: [id number] ?Date of Birth/Sex: 12-24-1961 (61 y.o. 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

## 2022-04-10 ENCOUNTER — Encounter: Payer: 59 | Admitting: Occupational Therapy

## 2022-04-11 DIAGNOSIS — T8131XA Disruption of external operation (surgical) wound, not elsewhere classified, initial encounter: Secondary | ICD-10-CM | POA: Diagnosis not present

## 2022-04-12 ENCOUNTER — Ambulatory Visit: Payer: 59 | Admitting: Occupational Therapy

## 2022-04-12 DIAGNOSIS — I890 Lymphedema, not elsewhere classified: Secondary | ICD-10-CM | POA: Diagnosis not present

## 2022-04-12 NOTE — Therapy (Signed)
[place] [facility] ?[place], [name], [address] ?Phone: [phone]   Fax:  [phone] ? ?Occupational Therapy Treatment Note and Progress Report ? ?Patient Details  ?Name: [name] ?MRN: [id number] ?Date of Birth: [birth year] ?Referring Provider (OT): Crystal Arringtonm FNP ? ? ?Encounter Date: 04/12/2022 ? ? OT End of Session - 04/12/22 1159   ? ? Visit Number 10   ? Number of Visits 36   ? Date for OT Re-Evaluation 05/15/22   ? OT Start Time 1100   ? OT Stop Time 1200   ? OT Time Calculation (min) 60 min   ? Activity Tolerance Patient tolerated treatment well;No increased pain   ? Behavior During Therapy [facility] for tasks assessed/performed   ? ?  ?  ? ?  ? ? ?Past Medical History:  ?Diagnosis Date  ? Arthritis   ? Back pain   ? Chronic knee pain   ? Edema of both lower extremities   ? High blood pressure   ? High cholesterol   ? Joint pain   ? Multiple sclerosis ([place])   ? Neuromuscular disorder ([place])   ? Multiple Sclerosis over 20 years  ? Obesity   ? Vitamin D deficiency   ? ? ?Past Surgical History:  ?Procedure Laterality Date  ? APPENDECTOMY    ? CESAREAN SECTION    ? HAND SURGERY    ? lumbar back surgery    ? LUMBAR WOUND DEBRIDEMENT N/A 12/05/2020  ? Procedure: Lumbar Wound Exploration;  Surgeon: Ashok Pall, MD;  Location: [facility];  Service: Neurosurgery;  Laterality: N/A;  3C/RM 21  ? LUMBAR WOUND DEBRIDEMENT N/A 02/21/2021  ? Procedure: Lumbar wound exploration;  Surgeon: Ashok Pall, MD;  Location: [facility];  Service: Neurosurgery;  Laterality: N/A;  posterior  ? LUMBAR WOUND DEBRIDEMENT N/A 04/11/2021  ? Procedure: Wound Exploration with Wound Vac Placement;  Surgeon: Ashok Pall, MD;  Location: [facility];  Service: Neurosurgery;  Laterality: N/A;  ? ROTATOR CUFF REPAIR    ? TONSILLECTOMY    ? ? ?There were no vitals filed for this visit. ? ? Subjective Assessment - 04/12/22 1207   ? ? Subjective  [name] returns to OT for treatment  visit 10/36 to address BLE lymphedema. Pt denies LE related pain in her legs. Pt has no new LE-related concerns.   ? Pertinent History B knee OA, HTN, Obesity, MS, Spondylolisthesis-lumbar, open wound at lumbar surgical site since 4/22.   ? Limitations difficulty walking, impaired transfers, chronic OA pain in bilateral knees, non healing post surgical lumbar wound, BLE muscle weakness 2/2 MS, chronic leg swelling with minimal pain,   ? Special Tests - [place] bilaterally; Intake FOTO score =31/100   ? Patient Stated Goals get my legs better so I can do more   ? Pain Onset Other (comment)   6 mnths ago without precipitating event. No family hx  ? Pain Onset Other (comment)   years  ? ?  ?  ? ?  ? ? ? ? ? ? ? ? ? ? ? ? ? ? ? OT Treatments/Exercises (OP) - 04/12/22 1207   ? ?  ? ADLs  ? ADL Education Given Yes   ?  ? Manual Therapy  ? Manual Therapy Edema management   ? Manual Lymphatic Drainage (MLD) MLD to RLE/RLQ utilizing diaphragmatic breathing to access deep abdominal pathways, short neck sequence and inguinal LN, then proovided MLD from proximal to distal  addressing thigh, leg and foot. Foinished up session with short neck x 3 and deep breathing.   ? Compression Bandaging RLE multilayer compression wrap from base of toes to popliteal fossa using 1 ea 8, 10 and 12 cm wide short stretch bandage over cotton stockinette and 0.4 cm thick Rosidal foam.   ? ?  ?  ? ?  ? ? ? ? ? ? ? ? ? OT Education - 04/12/22 1208   ? ? Education Details Continued skilled Pt/caregiver education  And LE ADL training throughout visit for lymphedema self care/ home program, including compression wrapping, compression garment and device wear/care, lymphatic pumping ther ex, simple self-MLD, and skin care. Discussed initial volumetric measurements. Discussed all long term goals.   ? Person(s) Educated Patient   ? Methods Explanation;Demonstration;Handout   ? Comprehension Verbalized understanding;Returned demonstration;Need further  instruction   ? ?  ?  ? ?  ? ? ? ? ? ? OT Long Term Goals - 04/12/22 1201   ? ?  ? OT LONG TERM GOAL #1  ? Title Given this patient?s Intake score 31 /100 on the functional outcomes FOTO tool, patient will experience an increase in function of 5 points to improve basic and instrumental ADLs performance, including lymphedema self-care.   ? Baseline 31/100   ? Period Weeks   ? Status Partially Met   TBA next visit  ? Target Date 05/16/22   ?  ? OT LONG TERM GOAL #2  ? Title Pt will demonstrate understanding of lymphedema prevention strategies by identifying and discussing 5 precautions using printed reference (modified assistance) to reduce risk of progression and to limit infection risk.   ? Baseline Max A   ? Time 4   ? Period Days   ? Status Achieved   ? Target Date --   4th OT Rx visit  ?  ? OT LONG TERM GOAL #3  ? Title With Maximum caregiver assistance Pt will be able to apply multilayer, LUE compression wraps using gradient techniques to decrease limb volume, to limit infection risk, and to limit lymphedema progression. Pt unable to wrap legs independently. Trained caregiver must assist during visit intervals for CDT to be effective.   ? Baseline dependent - Max caregiver assistance is necessary to complete Intensive Phase of Complete Decongestive Therapy and Lymphedema self-care home program  as Pt is unable to reach his feet to apply compression wraps and garments, to inspect skin, to groom nails to perform skin care and inspection and to perform simple self-MLD. Pt agrees to arrange for consistent caregiver prior to commencing CDT.   ? Time 4   ? Period Days   ? Status Achieved   ? Target Date --   4th OT Rx visit  ?  ? OT LONG TERM GOAL #4  ? Title With Maximum caregiver assistance between OT sessions Pt will achieve at least a 10% limb volume reduction below the knee bilaterally to return limb to more normal size and shape, to limit infection risk, to decrease pain, to improve function, and to limit  lymphedema progression.   ? Baseline dependent   ? Time 12   ? Period Weeks   ? Status Partially Met   achieved for RLE below the knee  ? Target Date 05/16/22   ?  ? OT LONG TERM GOAL #5  ? Title With caregiver assistance  Pt will achieve and sustain a least 85% compliance with all 4 LE self-care home program components throughout Intensive  Phase CDT, including modified simple self-MLD, daily skin inspection and care, lymphatic pumping the ex, 23/7 compression wraps to optimize limb volume reductions, to limit lymphedema progression and to limit further functional decline.   ? Baseline Dependent   ? Time 12   ? Period Weeks   ? Target Date 05/16/22   ? ?  ?  ? ?  ? ? ? ? ? ? ? ? Plan - 04/12/22 1200   ? ? Clinical Impression Statement Continued MLD and simultaneous skin care to RLE/ RLQ as established. Reapplied compression wraps as established. Forgot to complete comparative limb volumetrics. Next visit will review and update Progress Note.. Cont as epr POC.   ? OT Occupational Profile and History Detailed Assessment- Review of Records and additional review of physical, cognitive, psychosocial history related to current functional performance   ? Occupational performance deficits (Please refer to evaluation for details): ADL's;IADL's;Rest and Sleep;Leisure;Social Participation;Work   ? Rehab Potential Fair   ? Clinical Decision Making Several treatment options, min-mod task modification necessary   ? Comorbidities Affecting Occupational Performance: May have comorbidities impacting occupational performance   ? Modification or Assistance to Complete Evaluation  Min-Moderate modification of tasks or assist with assess necessary to complete eval   ? OT Frequency 2x / week   ? OT Duration 12 weeks   ? OT Treatment/Interventions Self-care/ADL training;DME and/or AE instruction;Manual lymph drainage;Compression bandaging;Therapeutic activities;Coping strategies training;Therapeutic exercise;Other (comment);Manual  Therapy;Energy conservation;Patient/family education   skin care, Flexitouch trial, fit with appropriate compression garments that are comfortable, effective and that Pt is able to don and doff using assistive devices P

## 2022-04-16 ENCOUNTER — Ambulatory Visit: Payer: 59 | Admitting: Occupational Therapy

## 2022-04-16 ENCOUNTER — Encounter: Payer: 59 | Admitting: Physician Assistant

## 2022-04-16 DIAGNOSIS — I890 Lymphedema, not elsewhere classified: Secondary | ICD-10-CM

## 2022-04-16 DIAGNOSIS — T8131XA Disruption of external operation (surgical) wound, not elsewhere classified, initial encounter: Secondary | ICD-10-CM | POA: Diagnosis not present

## 2022-04-16 NOTE — Progress Notes (Addendum)
[name], [name] ([id number]) ?Visit Report for 04/16/2022 ?Chief Complaint Document Details ?Patient Name: [name], [name] ?Date of Service: 04/16/2022 11:00 AM ?Medical Record Number: [id number] ?Patient Account Number: [id number] ?Date of Birth/Sex: November 26, 1961 (61 y.o. F) ?Treating RN: Yevonne Pax ?Primary Care Provider: CardJonny Ruiz Other Clinician: ?Referring Provider: Card, John ?Treating Provider/Extender: Allen Derry ?Weeks in Treatment: 26 ?Information Obtained from: Patient ?Chief Complaint ?Surgical Back Ulcer ?Electronic Signature(s) ?Signed: 04/16/2022 11:28:14 AM By: [name] PA-C ?Entered By: [name] on 04/16/2022 11:28:14 ?[name], [name] ([id number]) ?-------------------------------------------------------------------------------- ?HPI Details ?Patient Name: [name], [name] ?Date of Service: 04/16/2022 11:00 AM ?Medical Record Number: [id number] ?Patient Account Number: [id number] ?Date of Birth/Sex: 1961/10/07 (61 y.o. F) ?Treating RN: Yevonne Pax ?Primary Care Provider: CardJonny Ruiz Other Clinician: ?Referring Provider: Card, John ?Treating Provider/Extender: Allen Derry ?Weeks in Treatment: 26 ?History of Present Illness ?HPI Description: 10/15/2021 upon evaluation today patient presents for initial evaluation here in the clinic concerning a surgical ?ulceration/dehiscence in the lumbar spine region following surgery that she had over the past year. This was actually broken up into 3 separate ?surgical events. The initial surgical intervention actually was on November 05, 2021 almost a year ago. Subsequently the patient went back in ?February for a seroma of the area which unfortunately required her to have a repeat surgery to go in and clean this out. And then again this ?occurred in April where she went back in and again they felt like stitches were coming out and there was an additional seroma. She was placed in ?a wound VAC initially and then subsequently as it got smaller that  was discontinued. Again right now I will see anything that I think a wound VAC ?would help with. Nonetheless she definitely has a significant depth to the wound that is going require packing. I actually believe the Hydrofera ?Blue rope would probably do quite well with this the problem is as much as it is draining she probably needs this to be changed at least every day. ?She does not really have anyone that can help with that that is the complicating scenario here. With that being said the patient does have a ?history of multiple sclerosis, hypertension, and again this surgical wound dehiscence in regard to her lumbar spine region. She did have a repeat ?MRI which was actually completed 10/09/2021. This showed that there was no significant change in the subcutaneous fluid collection/track of the ?lower lumbar region. This is extending to the level of the fascia unfortunately. This seems to go all the way from the L2 level with a track ?extending all the way to the fascia at the L4-5 level. Again this is a significant wound and there is significant drainage but does not seem to ?communicate to the spinal region as far as spinal fluid or otherwise is concerned that is good news. Nonetheless she last saw Dr. Franky Macho who is ?her neurosurgeon on 10/01/2021 that was when he ordered this last MRI she supposed to see him next week as well. With that being said he did ?not feel like there was any significant issue there but was not sure why this was not healing that is when he ordered the MRI. They were wanting ?to make sure that this was packed appropriately by home health unfortunately the main issue currently is that home health is completely out of ?the picture as the patient has exhausted all the home health that that she gets for a year. She is now in a very difficult  predicament where she ?does not have anyone to help her change the dressing and to be honest that she is not able to do it herself with the location of  the wound being ?on the midline lumbar spine region. If she does not have anyone that can help it is probably can to be necessary for her to go to a facility for ?rehab and daily dressing changes as I feel like daily changes which is much drainage that she is having is going to be necessary. ?10/23/2021 upon evaluation today patient appears to be doing decently well in regard to her back ulcer. This does seem to be draining a lot less ?than what it is been doing in the past. With that being said she still has quite a bit of drainage nonetheless. I do think that given time this should ?improve least I hope so. The good news is she does have home health coming out 3 days a week were doing it 2 days a week and she is paying ?someone we can to help. ?10/30/2021 upon evaluation today patient appears to be doing okay in regard to her back ulcer this is not draining quite as bad as it was in the ?beginning but he still has quite a bit of drainage noted. I do believe that the patient would benefit from [place] going ahead forward with attempting a ?wound VAC using the Hydrofera Blue rope to pack with and then subsequently using the VAC externally to actually suction out and help this to fill- ?in. I think this is our ideal way to try to get things cleared at this point. As it stands I am not certain that we are really making a progress that we ?want to see near with doing it in the way we are which is packing with the rope. It is a good dressing but I do think it is insufficient for total ?healing. She just seems to have too much in the way of drainage at this point unfortunately. ?11/06/2021 upon evaluation today patient unfortunately continues to have issues with her back ongoing. The good news is her MRI that was ?repeated showed signs of the size of this area in the lumbar spine region having decreased from 4 cm to 3.5 cm this is definitely not bad news at ?all. With that being said unfortunately she continues to have issues  with ongoing drainage not as severe as in the beginning but nonetheless still ?significant. I do think a wound VAC still would be a good way to go although her home health agency nurse apparently has some concerns about ?the possibility of not being able to keep a seal with this as they had struggles in the past. Nonetheless I explained to the patient that this is much ?different than what she had previous and that I really feel like it would do much better as far as getting the area taken care of without having any ?complications or issues here. I think that we should be able to maintain a seal. Nonetheless at this time I did discuss with the patient as well that ?she probably does need to have a wound VAC in order for [place] to get this moving in the right direction. ?11/13/2021 upon evaluation patient's wound bed actually showed signs of significant drainage at this time. She did see the surgeon yesterday he ?did not see anything that appeared to be infected. Nonetheless he does appear that she is continuing to have areas here that just do not seem  to ?want to seal up there MRI findings have been negative but nonetheless she continues to have is the seroma that is filling in. I do feel like we need ?to try to widen the hole so we can get at least a half of the [facility] then this will be better than nothing at this point. ?11/20/2021 upon evaluation today patient actually appears to be having less pain at this point which is good news and overall she we still do not ?have the results of the culture back yet it had to be sent out to [facility] and we do not have the result back yet. Is doing decently well in regard to ?her wound. Fortunately there does not appear to be any signs of active infection systemically nor locally at this time. ?11/27/2021 upon evaluation today patient appears to be doing well with regard to her wound all things considered there does appear to be less ?drainage than there was previous.  Fortunately I do not see any evidence of worsening of the patient is stating that she is having some issues with ?back pain. This is somewhat new. Again this I think could be related to the fact that she is

## 2022-04-16 NOTE — Progress Notes (Signed)
Cartaya, Yulisa C. ([id number]) ?Visit Report for 04/11/2022 ?Arrival Information Details ?Patient Name: [name], [name] ?Date of Service: 04/11/2022 3:30 PM ?Medical Record Number: [id number] ?Patient Account Number: [id number] ?Date of Birth/Sex: 1961/07/15 (61 y.o. F) ?Treating RN: Yevonne Pax ?Primary Care Rafaela Dinius: CardJonny Ruiz Other Clinician: ?Referring Erasmus Bistline: Card, John ?Treating Zayn Selley/Extender: Allen Derry ?Weeks in Treatment: 25 ?Visit Information History Since Last Visit ?All ordered tests and consults were completed: No ?Patient Arrived: [name] ?Added or deleted any medications: No ?Arrival Time: 15:45 ?Any new allergies or adverse reactions: No ?Accompanied By: self ?Had a fall or experienced change in No ?Transfer Assistance: None ?activities of daily living that may affect ?Patient Identification Verified: Yes ?risk of falls: ?Secondary Verification Process Completed: Yes ?Signs or symptoms of abuse/neglect since last visito No ?Patient Requires Transmission-Based Precautions: No ?Hospitalized since last visit: No ?Patient Has Alerts: No ?Implantable device outside of the clinic excluding No ?cellular tissue based products placed in the center ?since last visit: ?Has Dressing in Place as Prescribed: Yes ?Pain Present Now: No ?Electronic Signature(s) ?Signed: 04/16/2022 9:16:22 AM By: Yevonne Pax RN ?Entered By: Yevonne Pax on 04/11/2022 15:46:53 ?Lore, Arra C. ([id number]) ?-------------------------------------------------------------------------------- ?Clinic Level of Care Assessment Details ?Patient Name: [name], [name] ?Date of Service: 04/11/2022 3:30 PM ?Medical Record Number: [id number] ?Patient Account Number: [id number] ?Date of Birth/Sex: May 04, 1961 (61 y.o. F) ?Treating RN: Yevonne Pax ?Primary Care Erie Radu: CardJonny Ruiz Other Clinician: ?Referring Leith Szafranski: Card, John ?Treating Sheriece Jefcoat/Extender: Allen Derry ?Weeks in Treatment: 25 ?Clinic Level of Care Assessment Items ?TOOL 4  Quantity Score ?X - Use when only an EandM is performed on FOLLOW-UP visit 1 0 ?ASSESSMENTS - Nursing Assessment / Reassessment ?X - Reassessment of Co-morbidities (includes updates in patient status) 1 10 ?X- 1 5 ?Reassessment of Adherence to Treatment Plan ?ASSESSMENTS - Wound and Skin Assessment / Reassessment ?X - Simple Wound Assessment / Reassessment - one wound 1 5 ?[]  - 0 ?Complex Wound Assessment / Reassessment - multiple wounds ?[]  - 0 ?Dermatologic / Skin Assessment (not related to wound area) ?ASSESSMENTS - Focused Assessment ?[]  - Circumferential Edema Measurements - multi extremities 0 ?[]  - 0 ?Nutritional Assessment / Counseling / Intervention ?[]  - 0 ?Lower Extremity Assessment (monofilament, tuning fork, pulses) ?[]  - 0 ?Peripheral Arterial Disease Assessment (using hand held doppler) ?ASSESSMENTS - Ostomy and/or Continence Assessment and Care ?[]  - Incontinence Assessment and Management 0 ?[]  - 0 ?Ostomy Care Assessment and Management (repouching, etc.) ?PROCESS - Coordination of Care ?X - Simple Patient / Family Education for ongoing care 1 15 ?[]  - 0 ?Complex (extensive) Patient / Family Education for ongoing care ?[]  - 0 ?Staff obtains Consents, Records, Test Results / Process Orders ?[]  - 0 ?Staff telephones HHA, Nursing Homes / Clarify orders / etc ?[]  - 0 ?Routine Transfer to another Facility (non-emergent condition) ?[]  - 0 ?Routine Hospital Admission (non-emergent condition) ?[]  - 0 ?New Admissions / / Ordering NPWT, Apligraf, etc. ?[]  - 0 ?Emergency Hospital Admission (emergent condition) ?X- 1 10 ?Simple Discharge Coordination ?[]  - 0 ?Complex (extensive) Discharge Coordination ?PROCESS - Special Needs ?[]  - Pediatric / Minor Patient Management 0 ?[]  - 0 ?Isolation Patient Management ?[]  - 0 ?Hearing / Language / Visual special needs ?[]  - 0 ?Assessment of Community assistance (transportation, D/C planning, etc.) ?[]  - 0 ?Additional assistance / Altered  mentation ?[]  - 0 ?Support Surface(s) Assessment (bed, cushion, seat, etc.) ?INTERVENTIONS - Wound Cleansing / Measurement ?Moe, Vickii C. ( ) ?X- 1 5 ?Simple Wound Cleansing -  one wound ?[]  - 0 ?Complex Wound Cleansing - multiple wounds ?X- 1 5 ?Wound Imaging (photographs - any number of wounds) ?[]  - 0 ?Wound Tracing (instead of photographs) ?X- 1 5 ?Simple Wound Measurement - one wound ?[]  - 0 ?Complex Wound Measurement - multiple wounds ?INTERVENTIONS - Wound Dressings ?[]  - Small Wound Dressing one or multiple wounds 0 ?X- 1 15 ?Medium Wound Dressing one or multiple wounds ?[]  - 0 ?Large Wound Dressing one or multiple wounds ?[]  - 0 ?Application of Medications - topical ?[]  - 0 ?Application of Medications - injection ?INTERVENTIONS - Miscellaneous ?[]  - External ear exam 0 ?[]  - 0 ?Specimen Collection (cultures, biopsies, blood, body fluids, etc.) ?[]  - 0 ?Specimen(s) / Culture(s) sent or taken to Lab for analysis ?[]  - 0 ?Patient Transfer (multiple staff / / Similar devices) ?[]  - 0 ?Simple Staple / Suture removal (25 or less) ?[]  - 0 ?Complex Staple / Suture removal (26 or more) ?[]  - 0 ?Hypo / Hyperglycemic Management (close monitor of Blood Glucose) ?[]  - 0 ?Ankle / Brachial Index (ABI) - do not check if billed separately ?X- 1 5 ?Vital Signs ?Has the patient been seen at the hospital within the last three years: Yes ?Total Score: 80 ?Level Of Care: New/Established - ?Level 3 ?Electronic Signature(s) ?Signed: 04/16/2022 9:16:22 AM By: RN ?Entered By: on 04/11/2022 15:50:22 ?Niccoli, Matalyn C. ( ) ?-------------------------------------------------------------------------------- ?Encounter Discharge Information Details ?Patient Name: [name], [name] ?Date of Service: 04/11/2022 3:30 PM ?Medical Record Number: ?Patient Account Number: ?Date of Birth/Sex: 05-Jan-1961 (61 y.o. F) ?Treating RN: ?Primary Care Odilon Cass:  Card Other Clinician: ?Referring Trenden Hazelrigg: Card, John ?Treating Kellina Dreese/Extender: ?Weeks in Treatment: 25 ?Encounter Discharge Information Items ?Discharge Condition: Stable ?Ambulatory Status: ?Discharge Destination: Home ?Transportation: Private Auto ?Accompanied By: self ?Schedule Follow-up Appointment: Yes ?Clinical Summary of Care: Patient Declined ?Electronic Signature(s) ?Signed: 04/16/2022 9:16:22 AM By: Yevonne Pax RN ?Entered By: Yevonne Pax on 04/11/2022 15:49:24 ?Boliver, Bhavika C. ([id number]) ?-------------------------------------------------------------------------------- ?Wound Assessment Details ?Patient Name: [name], [name] ?Date of Service: 04/11/2022 3:30 PM ?Medical Record Number: [id number] ?Patient Account Number: [id number] ?Date of Birth/Sex: 07-May-1961 (61 y.o. F) ?Treating RN: Yevonne Pax ?Primary Care Corryn Madewell: CardJonny Ruiz Other Clinician: ?Referring Tonishia Steffy: Card, John ?Treating Jaevion Goto/Extender: Allen Derry ?Weeks in Treatment: 25 ?Wound Status ?Wound Number: 1 ?Primary Etiology: Dehisced Wound ?Wound Location: Distal, Midline Back ?Wound Status: Open ?Wounding Event: Surgical Injury ?Comorbid History: Hypertension ?Date Acquired: 04/11/2021 ?Weeks Of Treatment: 25 ?Clustered Wound: No ?Wound Measurements ?Length: (cm) 0.3 ?Width: (cm) 0.5 ?Depth: (cm) 3.1 ?Area: (cm?) 0.118 ?Volume: (cm?) 0.365 ?% Reduction in Area: 6.3% ?% Reduction in Volume: 36.9% ?Epithelialization: None ?Tunneling: No ?Undermining: No ?Wound Description ?Classification: Full Thickness Without Exposed Support Structu ?Exudate Amount: Medium ?Exudate Type: Serous ?Exudate Color: amber ?res Foul Odor After Cleansing: No ?Slough/Fibrino No ?Wound Bed ?Granulation Amount: Large (67-100%) Exposed Structure ?Granulation Quality: Red ?Fascia Exposed: No ?Necrotic Amount: None Present (0%) ?Fat Layer (Subcutaneous Tissue) Exposed: Yes ?Tendon Exposed: No ?Muscle Exposed: No ?Joint Exposed:  No ?Bone Exposed: No ?Treatment Notes ?Wound #1 (Back) Wound Laterality: Midline, Distal ?Cleanser ?Normal Saline ?Discharge Instruction: Wash your hands with soap and water. Remove old dressing, discard into plastic b

## 2022-04-16 NOTE — Progress Notes (Signed)
Hinojos, Aarushi C. ([id number]) ?Visit Report for 04/11/2022 ?Physician Orders Details ?Patient Name: [name], [name] ?Date of Service: 04/11/2022 3:30 PM ?Medical Record Number: [id number] ?Patient Account Number: [id number] ?Date of Birth/Sex: 08/04/1961 (61 y.o. F) ?Treating RN: Yevonne Pax ?Primary Care Provider: CardJonny Ruiz Other Clinician: ?Referring Provider: Card, John ?Treating Provider/Extender: Allen Derry ?Weeks in Treatment: 25 ?Verbal / Phone Orders: No ?Diagnosis Coding ?Follow-up Appointments ?o Return Appointment in 1 week. ?o Nurse Visit as needed - nurse visit on tuesday and thursday ?Home Health ?o Home Health Company: - [facility] ?o CONTINUE Home Health for wound care. May utilize formulary equivalent dressing for wound treatment orders unless ?otherwise specified. Home Health Nurse may visit PRN to address patientos wound care needs. - 3 times per week ?[facility] fax [phone] ?Bathing/ Shower/ Hygiene ?o May shower; gently cleanse wound with antibacterial soap, rinse and pat dry prior to dressing wounds ?o No tub bath. ?Anesthetic (Use 'Patient Medications' Section for Anesthetic Order Entry) ?o Lidocaine applied to wound bed ?Edema Control - Lymphedema / Segmental Compressive Device / Other ?o Elevate, Exercise Daily and Avoid Standing for Long Periods of Time. ?o Elevate legs to the level of the heart and pump ankles as often as possible ?o Elevate leg(s) parallel to the floor when sitting. ?Additional Orders / Instructions ?o Follow Nutritious Diet and Increase Protein Intake ?Wound Treatment ?Wound #1 - Back Wound Laterality: Midline, Distal ?Cleanser: Normal Saline 1 x Per Day/30 Days ?Discharge Instructions: Wash your hands with soap and water. Remove old dressing, discard into plastic bag and place into trash. ?Cleanse the wound with Normal Saline prior to applying a clean dressing using gauze sponges, not tissues or cotton balls. Do not scrub ?or use excessive force.  Pat dry using gauze sponges, not tissue or cotton balls. ?Secondary Dressing: (SILICONE BORDER) Zetuvit Plus SILICONE BORDER Dressing 4x4 (in/in) 1 x Per Day/30 Days ?Electronic Signature(s) ?Signed: 04/11/2022 3:59:26 PM By: [name] PA-C ?Signed: 04/16/2022 9:16:22 AM By: Yevonne Pax RN ?Entered By: Yevonne Pax on 04/11/2022 15:48:48 ?Haacke, [name] ([id number]) ?-------------------------------------------------------------------------------- ?SuperBill Details ?Patient Name: [name], [name] ?Date of Service: 04/11/2022 ?Medical Record Number: [id number] ?Patient Account Number: [id number] ?Date of Birth/Sex: Feb 17, 1961 (61 y.o. 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

## 2022-04-17 ENCOUNTER — Encounter: Payer: 59 | Admitting: Occupational Therapy

## 2022-04-17 NOTE — Progress Notes (Signed)
Santori, Maraya C. ([id number]) ?Visit Report for 04/16/2022 ?Arrival Information Details ?Patient Name: [name], [name] ?Date of Service: 04/16/2022 11:00 AM ?Medical Record Number: [id number] ?Patient Account Number: [id number] ?Date of Birth/Sex: September 09, 1961 (61 y.o. F) ?Treating RN: Yevonne Pax ?Primary Care Sofie Schendel: CardJonny Ruiz Other Clinician: ?Referring Laydon Martis: Card, John ?Treating Mindy Behnken/Extender: Allen Derry ?Weeks in Treatment: 26 ?Visit Information History Since Last Visit ?All ordered tests and consults were completed: No ?Patient Arrived: [name] ?Added or deleted any medications: No ?Arrival Time: 11:05 ?Any new allergies or adverse reactions: No ?Accompanied By: self ?Had a fall or experienced change in No ?Transfer Assistance: None ?activities of daily living that may affect ?Patient Identification Verified: Yes ?risk of falls: ?Secondary Verification Process Completed: Yes ?Signs or symptoms of abuse/neglect since last visito No ?Patient Requires Transmission-Based Precautions: No ?Hospitalized since last visit: No ?Patient Has Alerts: No ?Implantable device outside of the clinic excluding No ?cellular tissue based products placed in the center ?since last visit: ?Has Dressing in Place as Prescribed: Yes ?Pain Present Now: Yes ?Electronic Signature(s) ?Signed: 04/16/2022 4:47:42 PM By: Yevonne Pax RN ?Entered By: Yevonne Pax on 04/16/2022 11:05:30 ?Frigon, Evola C. ([id number]) ?-------------------------------------------------------------------------------- ?Clinic Level of Care Assessment Details ?Patient Name: [name] ?Date of Service: 04/16/2022 11:00 AM ?Medical Record Number: [id number] ?Patient Account Number: [id number] ?Date of Birth/Sex: September 13, 1961 (61 y.o. F) ?Treating RN: Yevonne Pax ?Primary Care Shepard Keltz: CardJonny Ruiz Other Clinician: ?Referring Shaliah Wann: Card, John ?Treating Charleen Madera/Extender: Allen Derry ?Weeks in Treatment: 26 ?Clinic Level of Care Assessment Items ?TOOL  4 Quantity Score ?X - Use when only an EandM is performed on FOLLOW-UP visit 1 0 ?ASSESSMENTS - Nursing Assessment / Reassessment ?X - Reassessment of Co-morbidities (includes updates in patient status) 1 10 ?X- 1 5 ?Reassessment of Adherence to Treatment Plan ?ASSESSMENTS - Wound and Skin Assessment / Reassessment ?X - Simple Wound Assessment / Reassessment - one wound 1 5 ?[]  - 0 ?Complex Wound Assessment / Reassessment - multiple wounds ?[]  - 0 ?Dermatologic / Skin Assessment (not related to wound area) ?ASSESSMENTS - Focused Assessment ?[]  - Circumferential Edema Measurements - multi extremities 0 ?[]  - 0 ?Nutritional Assessment / Counseling / Intervention ?[]  - 0 ?Lower Extremity Assessment (monofilament, tuning fork, pulses) ?[]  - 0 ?Peripheral Arterial Disease Assessment (using hand held doppler) ?ASSESSMENTS - Ostomy and/or Continence Assessment and Care ?[]  - Incontinence Assessment and Management 0 ?[]  - 0 ?Ostomy Care Assessment and Management (repouching, etc.) ?PROCESS - Coordination of Care ?X - Simple Patient / Family Education for ongoing care 1 15 ?[]  - 0 ?Complex (extensive) Patient / Family Education for ongoing care ?[]  - 0 ?Staff obtains Consents, Records, Test Results / Process Orders ?[]  - 0 ?Staff telephones HHA, Nursing Homes / Clarify orders / etc ?[]  - 0 ?Routine Transfer to another Facility (non-emergent condition) ?[]  - 0 ?Routine Hospital Admission (non-emergent condition) ?[]  - 0 ?New Admissions / / Ordering NPWT, Apligraf, etc. ?[]  - 0 ?Emergency Hospital Admission (emergent condition) ?X- 1 10 ?Simple Discharge Coordination ?[]  - 0 ?Complex (extensive) Discharge Coordination ?PROCESS - Special Needs ?[]  - Pediatric / Minor Patient Management 0 ?[]  - 0 ?Isolation Patient Management ?[]  - 0 ?Hearing / Language / Visual special needs ?[]  - 0 ?Assessment of Community assistance (transportation, D/C planning, etc.) ?[]  - 0 ?Additional assistance / Altered  mentation ?[]  - 0 ?Support Surface(s) Assessment (bed, cushion, seat, etc.) ?INTERVENTIONS - Wound Cleansing / Measurement ?Kolk, Theresia C. ( ) ?X- 1 5 ?Simple Wound Cleansing -  one wound ?[]  - 0 ?Complex Wound Cleansing - multiple wounds ?X- 1 5 ?Wound Imaging (photographs - any number of wounds) ?[]  - 0 ?Wound Tracing (instead of photographs) ?X- 1 5 ?Simple Wound Measurement - one wound ?[]  - 0 ?Complex Wound Measurement - multiple wounds ?INTERVENTIONS - Wound Dressings ?X - Small Wound Dressing one or multiple wounds 1 10 ?[]  - 0 ?Medium Wound Dressing one or multiple wounds ?[]  - 0 ?Large Wound Dressing one or multiple wounds ?[]  - 0 ?Application of Medications - topical ?[]  - 0 ?Application of Medications - injection ?INTERVENTIONS - Miscellaneous ?[]  - External ear exam 0 ?[]  - 0 ?Specimen Collection (cultures, biopsies, blood, body fluids, etc.) ?[]  - 0 ?Specimen(s) / Culture(s) sent or taken to Lab for analysis ?[]  - 0 ?Patient Transfer (multiple staff / / Similar devices) ?[]  - 0 ?Simple Staple / Suture removal (25 or less) ?[]  - 0 ?Complex Staple / Suture removal (26 or more) ?[]  - 0 ?Hypo / Hyperglycemic Management (close monitor of Blood Glucose) ?[]  - 0 ?Ankle / Brachial Index (ABI) - do not check if billed separately ?X- 1 5 ?Vital Signs ?Has the patient been seen at the hospital within the last three years: Yes ?Total Score: 75 ?Level Of Care: New/Established - Level ?2 ?Electronic Signature(s) ?Signed: 04/16/2022 4:47:42 PM By: RN ?Entered By: on 04/16/2022 11:33:39 ?Crutchley, Merissa C. ( ) ?-------------------------------------------------------------------------------- ?Encounter Discharge Information Details ?Patient Name: [name] ?Date of Service: 04/16/2022 11:00 AM ?Medical Record Number: ?Patient Account Number: ?Date of Birth/Sex: 07/31/1961 (61 y.o. F) ?Treating RN: ?Primary Care Kirstan Fentress:  Card Other Clinician: ?Referring Kash Mothershead: Card, John ?Treating Yzabella Crunk/Extender: ?Weeks in Treatment: 26 ?Encounter Discharge Information Items ?Discharge Condition: Stable ?Ambulatory Status: ?Discharge Destination: Home ?Transportation: Private Auto ?Accompanied By: self ?Schedule Follow-up Appointment: Yes ?Clinical Summary of Care: Patient Declined ?Electronic Signature(s) ?Signed: 04/16/2022 4:47:42 PM By: Yevonne Pax RN ?Entered By: Yevonne Pax on 04/16/2022 11:35:30 ?Jalbert, Angelita C. ([id number]) ?-------------------------------------------------------------------------------- ?Lower Extremity Assessment Details ?Patient Name: PAYTIN, RAMAKRISHNAN ?Date of Service: 04/16/2022 11:00 AM ?Medical Record Number: [id number] ?Patient Account Number: [id number] ?Date of Birth/Sex: 1961-02-21 (61 y.o. F) ?Treating RN: Yevonne Pax ?Primary Care Dale Strausser: CardJonny Ruiz Other Clinician: ?Referring Sheika Coutts: Card, John ?Treating Orvis Stann/Extender: Allen Derry ?Weeks in Treatment: 26 ?Electronic Signature(s) ?Signed: 04/16/2022 4:47:42 PM By: 04/18/2022 RN ?Entered By: Yevonne Pax on 04/16/2022 11:09:05 ?Kampf, Harrietta C. (04/18/2022) ?-------------------------------------------------------------------------------- ?Multi Wound Chart Details ?Patient Name: LEESHA, VENO ?Date of Service: 04/16/2022 11:00 AM ?Medical Record Number: 04/18/2022 ?Patient Account Number: [id number] ?Date of Birth/Sex: 10/25/1961 (61 y.o. F) ?Treating RN: (67 ?Primary Care Arty Lantzy: CardYevonne Pax Other Clinician: ?Referring Alyzza Andringa: Card, John ?Treating Ambrea Hegler/Extender: Jonny Ruiz ?Weeks in Treatment: 26 ?Vital Signs ?Height(in): ?Pulse(bpm): 92 ?Weight(lbs): 275 ?Blood Pressure(mmHg): 133/85 ?Body Mass Index(BMI): ?Temperature(??F): 98.3 ?Respiratory Rate(breaths/min): 18 ?Photos: [N/A:N/A] ?Wound Location: Distal, Midline Back N/A N/A ?Wounding Event: Surgical Injury N/A N/A ?Primary Etiology: Dehisced  Wound N/A N/A ?Comorbid History: Hypertension N/A N/A ?Date Acquired: 04/11/2021 N/A N/A ?Weeks of Treatment: 26 N/A N/A ?Wound Status: Open N/A N/A ?Wound Recurrence: No N/A N/A ?Measurements L x W x D (cm) 0.3x

## 2022-04-18 DIAGNOSIS — T8131XA Disruption of external operation (surgical) wound, not elsewhere classified, initial encounter: Secondary | ICD-10-CM | POA: Diagnosis not present

## 2022-04-19 ENCOUNTER — Ambulatory Visit: Payer: 59 | Admitting: Occupational Therapy

## 2022-04-19 DIAGNOSIS — I890 Lymphedema, not elsewhere classified: Secondary | ICD-10-CM

## 2022-04-19 NOTE — Progress Notes (Signed)
Manrique, Jamaya C. ([id number]) ?Visit Report for 04/18/2022 ?Physician Orders Details ?Patient Name: [name], [name] ?Date of Service: 04/18/2022 3:30 PM ?Medical Record Number: [id number] ?Patient Account Number: [id number] ?Date of Birth/Sex: [birth year] (61 y.o. F) ?Treating RN: Yevonne Pax ?Primary Care Provider: CardJonny Ruiz Other Clinician: ?Referring Provider: Card, John ?Treating Provider/Extender: Allen Derry ?Weeks in Treatment: 26 ?Verbal / Phone Orders: No ?Diagnosis Coding ?Follow-up Appointments ?o Return Appointment in 1 week. ?o Nurse Visit as needed - nurse visit on tuesday and thursday ?Home Health ?o Home Health Company: - [facility] ?o CONTINUE Home Health for wound care. May utilize formulary equivalent dressing for wound treatment orders unless ?otherwise specified. Home Health Nurse may visit PRN to address patientos wound care needs. - 3 times per week ?[facility] fax [phone] ?Bathing/ Shower/ Hygiene ?o May shower; gently cleanse wound with antibacterial soap, rinse and pat dry prior to dressing wounds ?o No tub bath. ?Anesthetic (Use 'Patient Medications' Section for Anesthetic Order Entry) ?o Lidocaine applied to wound bed ?Edema Control - Lymphedema / Segmental Compressive Device / Other ?o Elevate, Exercise Daily and Avoid Standing for Long Periods of Time. ?o Elevate legs to the level of the heart and pump ankles as often as possible ?o Elevate leg(s) parallel to the floor when sitting. ?Additional Orders / Instructions ?o Follow Nutritious Diet and Increase Protein Intake ?Wound Treatment ?Wound #1 - Back Wound Laterality: Midline, Distal ?Cleanser: Normal Saline 1 x Per Day/30 Days ?Discharge Instructions: Wash your hands with soap and water. Remove old dressing, discard into plastic bag and place into trash. ?Cleanse the wound with Normal Saline prior to applying a clean dressing using gauze sponges, not tissues or cotton balls. Do not scrub ?or use excessive force.  Pat dry using gauze sponges, not tissue or cotton balls. ?Secondary Dressing: (SILICONE BORDER) Zetuvit Plus SILICONE BORDER Dressing 4x4 (in/in) 1 x Per Day/30 Days ?Electronic Signature(s) ?Signed: 04/19/2022 4:04:50 PM By: Yevonne Pax RN ?Signed: 04/19/2022 5:32:35 PM By: [name] PA-C ?Entered By: Yevonne Pax on 04/18/2022 15:31:28 ?Guisinger, Evann C. ([id number]) ?-------------------------------------------------------------------------------- ?SuperBill Details ?Patient Name: VERNELLA, NIZNIK. ?Date of Service: 04/18/2022 ?Medical Record Number: [id number] ?Patient Account Number: [id number] ?Date of Birth/Sex: 01/03/1961 (61 y.o. 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

## 2022-04-19 NOTE — Patient Instructions (Signed)

## 2022-04-19 NOTE — Therapy (Signed)
[facility] ?[place], [name], [address] ?Phone: [phone]   Fax:  [phone] ? ?Occupational Therapy Treatment ? ?Patient Details  ?Name: [name] ?MRN: [id number] ?Date of Birth: 1961-07-01 ?Referring Provider (OT): Crystal Arringtonm FNP ? ? ?Encounter Date: 04/16/2022 ? ? OT End of Session - 04/19/22 1248   ? ? Visit Number 11   ? Number of Visits 36   ? Date for OT Re-Evaluation 05/15/22   ? OT Start Time 0102   ? OT Stop Time 0205   ? OT Time Calculation (min) 63 min   ? Activity Tolerance Patient tolerated treatment well;No increased pain   ? Behavior During Therapy [facility] for tasks assessed/performed   ? ?  ?  ? ?  ? ? ?Past Medical History:  ?Diagnosis Date  ? Arthritis   ? Back pain   ? Chronic knee pain   ? Edema of both lower extremities   ? High blood pressure   ? High cholesterol   ? Joint pain   ? Multiple sclerosis ([name])   ? Neuromuscular disorder ([place])   ? Multiple Sclerosis over 20 years  ? Obesity   ? Vitamin D deficiency   ? ? ?Past Surgical History:  ?Procedure Laterality Date  ? APPENDECTOMY    ? CESAREAN SECTION    ? HAND SURGERY    ? lumbar back surgery    ? LUMBAR WOUND DEBRIDEMENT N/A 12/05/2020  ? Procedure: Lumbar Wound Exploration;  Surgeon: Ashok Pall, MD;  Location: [facility];  Service: Neurosurgery;  Laterality: N/A;  3C/RM 21  ? LUMBAR WOUND DEBRIDEMENT N/A 02/21/2021  ? Procedure: Lumbar wound exploration;  Surgeon: Ashok Pall, MD;  Location: [facility];  Service: Neurosurgery;  Laterality: N/A;  posterior  ? LUMBAR WOUND DEBRIDEMENT N/A 04/11/2021  ? Procedure: Wound Exploration with Wound Vac Placement;  Surgeon: Ashok Pall, MD;  Location: [facility];  Service: Neurosurgery;  Laterality: N/A;  ? ROTATOR CUFF REPAIR    ? TONSILLECTOMY    ? ? ?There were no vitals filed for this visit. ? ? Subjective Assessment - 04/19/22 1249   ? ? Subjective  [name] returns to OT for treatment visit 11/36 to address  BLE lymphedema. Pt denies LE related pain in her legs. Pt reports her doctor called in alternative pain medication and she feels much better today.   ? Pertinent History B knee OA, HTN, Obesity, MS, Spondylolisthesis-lumbar, open wound at lumbar surgical site since 4/22.   ? Limitations difficulty walking, impaired transfers, chronic OA pain in bilateral knees, non healing post surgical lumbar wound, BLE muscle weakness 2/2 MS, chronic leg swelling with minimal pain,   ? Special Tests - Stemmer bilaterally; Intake FOTO score =31/100   ? Patient Stated Goals get my legs better so I can do more   ? Pain Onset Other (comment)   6 mnths ago without precipitating event. No family hx  ? Pain Onset Other (comment)   years  ? ?  ?  ? ?  ? ? ? ? ? ? ? ? ? ? ? ? ? ? ? OT Treatments/Exercises (OP) - 04/19/22 1250   ? ?  ? ADLs  ? ADL Education Given Yes   ?  ? Manual Therapy  ? Manual Therapy Edema management   ? Manual Lymphatic Drainage (MLD) MLD to RLE/RLQ utilizing diaphragmatic breathing to access deep abdominal pathways, short neck sequence and inguinal LN, then proovided  MLD from proximal to distal addressing thigh, leg and foot. Foinished up session with short neck x 3 and deep breathing.   ? Compression Bandaging RLE multilayer compression wrap from base of toes to popliteal fossa using 1 ea 8, 10 and 12 cm wide short stretch bandage over cotton stockinette and 0.4 cm thick Rosidal foam.   ? ?  ?  ? ?  ? ? ? ? ? ? ? ? ? OT Education - 04/19/22 1250   ? ? Education Details Continued skilled Pt/caregiver education  And LE ADL training throughout visit for lymphedema self care/ home program, including compression wrapping, compression garment and device wear/care, lymphatic pumping ther ex, simple self-MLD, and skin care. Discussed initial volumetric measurements. Discussed all long term goals.   ? Person(s) Educated Patient   ? Methods Explanation;Demonstration;Handout   ? Comprehension Verbalized understanding;Returned  demonstration;Need further instruction   ? ?  ?  ? ?  ? ? ? ? ? ? OT Long Term Goals - 04/16/22 1359   ? ?  ? OT LONG TERM GOAL #1  ? Title Given this patient?s Intake score 31 /100 on the functional outcomes FOTO tool, patient will experience an increase in function of 5 points to improve basic and instrumental ADLs performance, including lymphedema self-care.   ? Baseline 31/100   ? Period Weeks   ? Status Partially Met   TBA next visit  ? Target Date 05/16/22   ?  ? OT LONG TERM GOAL #2  ? Title Pt will demonstrate understanding of lymphedema prevention strategies by identifying and discussing 5 precautions using printed reference (modified assistance) to reduce risk of progression and to limit infection risk.   ? Baseline Max A   ? Time 4   ? Period Days   ? Status Achieved   ? Target Date --   4th OT Rx visit  ?  ? OT LONG TERM GOAL #3  ? Title With Maximum caregiver assistance Pt will be able to apply multilayer, LUE compression wraps using gradient techniques to decrease limb volume, to limit infection risk, and to limit lymphedema progression. Pt unable to wrap legs independently. Trained caregiver must assist during visit intervals for CDT to be effective.   ? Baseline dependent - Max caregiver assistance is necessary to complete Intensive Phase of Complete Decongestive Therapy and Lymphedema self-care home program  as Pt is unable to reach his feet to apply compression wraps and garments, to inspect skin, to groom nails to perform skin care and inspection and to perform simple self-MLD. Pt agrees to arrange for consistent caregiver prior to commencing CDT.   ? Time 4   ? Period Days   ? Status Achieved   ? Target Date --   4th OT Rx visit  ?  ? OT LONG TERM GOAL #4  ? Title With Maximum caregiver assistance between OT sessions Pt will achieve at least a 10% limb volume reduction below the knee bilaterally to return limb to more normal size and shape, to limit infection risk, to decrease pain, to improve  function, and to limit lymphedema progression.   Pt has achieved 8% voume reduction to date , which is excellent progress towards goals.  ? Baseline dependent   ? Time 12   ? Period Weeks   ? Status Partially Met   achieved for RLE below the knee  ? Target Date 05/16/22   ?  ? OT LONG TERM GOAL #5  ? Title With caregiver  assistance  Pt will achieve and sustain a least 85% compliance with all 4 LE self-care home program components throughout Intensive Phase CDT, including modified simple self-MLD, daily skin inspection and care, lymphatic pumping the ex, 23/7 compression wraps to optimize limb volume reductions, to limit lymphedema progression and to limit further functional decline.   ? Baseline Dependent   ? Time 12   ? Period Weeks   ? Status Partially Met   ? Target Date 05/16/22   ? ?  ?  ? ?  ? ? ? ? ? ? ? ? Plan - 04/19/22 1253   ? ? Clinical Impression Statement Continued MLD and simultaneous skin care to RLE/ RLQ as established. Reapplied compression wraps as established. Forgot to complete comparative limb volumetrics. Next visit will review and update Progress Note.. Cont as epr POC.   ? OT Occupational Profile and History Detailed Assessment- Review of Records and additional review of physical, cognitive, psychosocial history related to current functional performance   ? Occupational performance deficits (Please refer to evaluation for details): ADL's;IADL's;Rest and Sleep;Leisure;Social Participation;Work   ? Rehab Potential Fair   ? Clinical Decision Making Several treatment options, min-mod task modification necessary   ? Comorbidities Affecting Occupational Performance: May have comorbidities impacting occupational performance   ? Modification or Assistance to Complete Evaluation  Min-Moderate modification of tasks or assist with assess necessary to complete eval   ? OT Frequency 2x / week   ? OT Duration 12 weeks   ? OT Treatment/Interventions Self-care/ADL training;DME and/or AE instruction;Manual  lymph drainage;Compression bandaging;Therapeutic activities;Coping strategies training;Therapeutic exercise;Other (comment);Manual Therapy;Energy conservation;Patient/family education   skin care, [employer]it

## 2022-04-19 NOTE — Progress Notes (Signed)
Mavity, Avalina C. ([id number]) ?Visit Report for 04/18/2022 ?Arrival Information Details ?Patient Name: [name], [name] ?Date of Service: 04/18/2022 3:30 PM ?Medical Record Number: [id number] ?Patient Account Number: [id number] ?Date of Birth/Sex: 08/06/1961 (61 y.o. F) ?Treating RN: Carlene Coria ?Primary Care Diago Haik: CardJenny Reichmann Other Clinician: ?Referring Jahnia Hewes: Card, John ?Treating Kineta Fudala/Extender: Jeri Cos ?Weeks in Treatment: 26 ?Visit Information History Since Last Visit ?All ordered tests and consults were completed: No ?Patient Arrived: [name] ?Added or deleted any medications: No ?Arrival Time: 15:28 ?Any new allergies or adverse reactions: No ?Accompanied By: self ?Had a fall or experienced change in No ?Transfer Assistance: None ?activities of daily living that may affect ?Patient Identification Verified: Yes ?risk of falls: ?Secondary Verification Process Completed: Yes ?Signs or symptoms of abuse/neglect since last visito No ?Patient Requires Transmission-Based Precautions: No ?Hospitalized since last visit: No ?Patient Has Alerts: No ?Implantable device outside of the clinic excluding No ?cellular tissue based products placed in the center ?since last visit: ?Has Dressing in Place as Prescribed: Yes ?Pain Present Now: No ?Electronic Signature(s) ?Signed: 04/19/2022 4:04:50 PM By: Carlene Coria RN ?Entered By: Carlene Coria on 04/18/2022 15:29:09 ?[name], [name] ([id number]) ?-------------------------------------------------------------------------------- ?Clinic Level of Care Assessment Details ?Patient Name: [name], [name] ?Date of Service: 04/18/2022 3:30 PM ?Medical Record Number: [id number] ?Patient Account Number: [id number] ?Date of Birth/Sex: Jul 25, 1961 (61 y.o. F) ?Treating RN: Carlene Coria ?Primary Care Damon Baisch: CardJenny Reichmann Other Clinician: ?Referring Donnica Jarnagin: Card, John ?Treating Karelly Dewalt/Extender: Jeri Cos ?Weeks in Treatment: 26 ?Clinic Level of Care Assessment Items ?TOOL 4  Quantity Score ?X - Use when only an EandM is performed on FOLLOW-UP visit 1 0 ?ASSESSMENTS - Nursing Assessment / Reassessment ?X - Reassessment of Co-morbidities (includes updates in patient status) 1 10 ?X- 1 5 ?Reassessment of Adherence to Treatment Plan ?ASSESSMENTS - Wound and Skin Assessment / Reassessment ?X - Simple Wound Assessment / Reassessment - one wound 1 5 ?[]  - 0 ?Complex Wound Assessment / Reassessment - multiple wounds ?[]  - 0 ?Dermatologic / Skin Assessment (not related to wound area) ?ASSESSMENTS - Focused Assessment ?[]  - Circumferential Edema Measurements - multi extremities 0 ?[]  - 0 ?Nutritional Assessment / Counseling / Intervention ?[]  - 0 ?Lower Extremity Assessment (monofilament, tuning fork, pulses) ?[]  - 0 ?Peripheral Arterial Disease Assessment (using hand held doppler) ?ASSESSMENTS - Ostomy and/or Continence Assessment and Care ?[]  - Incontinence Assessment and Management 0 ?[]  - 0 ?Ostomy Care Assessment and Management (repouching, etc.) ?PROCESS - Coordination of Care ?X - Simple Patient / Family Education for ongoing care 1 15 ?[]  - 0 ?Complex (extensive) Patient / Family Education for ongoing care ?[]  - 0 ?Staff obtains Consents, Records, Test Results / Process Orders ?[]  - 0 ?Staff telephones HHA, Nursing Homes / Clarify orders / etc ?[]  - 0 ?Routine Transfer to another Facility (non-emergent condition) ?[]  - 0 ?Routine Hospital Admission (non-emergent condition) ?[]  - 0 ?New Admissions / Biomedical engineer / Ordering NPWT, Apligraf, etc. ?[]  - 0 ?Emergency Hospital Admission (emergent condition) ?X- 1 10 ?Simple Discharge Coordination ?[]  - 0 ?Complex (extensive) Discharge Coordination ?PROCESS - Special Needs ?[]  - Pediatric / Minor Patient Management 0 ?[]  - 0 ?Isolation Patient Management ?[]  - 0 ?Hearing / Language / Visual special needs ?[]  - 0 ?Assessment of Community assistance (transportation, D/C planning, etc.) ?[]  - 0 ?Additional assistance / Altered  mentation ?[]  - 0 ?Support Surface(s) Assessment (bed, cushion, seat, etc.) ?INTERVENTIONS - Wound Cleansing / Measurement ?Agent, [name] ([id number]) ?X- 1 5 ?Simple Wound Cleansing -  one wound ?[]  - 0 ?Complex Wound Cleansing - multiple wounds ?X- 1 5 ?Wound Imaging (photographs - any number of wounds) ?[]  - 0 ?Wound Tracing (instead of photographs) ?X- 1 5 ?Simple Wound Measurement - one wound ?[]  - 0 ?Complex Wound Measurement - multiple wounds ?INTERVENTIONS - Wound Dressings ?X - Small Wound Dressing one or multiple wounds 1 10 ?[]  - 0 ?Medium Wound Dressing one or multiple wounds ?[]  - 0 ?Large Wound Dressing one or multiple wounds ?[]  - 0 ?Application of Medications - topical ?[]  - 0 ?Application of Medications - injection ?INTERVENTIONS - Miscellaneous ?[]  - External ear exam 0 ?[]  - 0 ?Specimen Collection (cultures, biopsies, blood, body fluids, etc.) ?[]  - 0 ?Specimen(s) / Culture(s) sent or taken to Lab for analysis ?[]  - 0 ?Patient Transfer (multiple staff / Civil Service fast streamer / Similar devices) ?[]  - 0 ?Simple Staple / Suture removal (25 or less) ?[]  - 0 ?Complex Staple / Suture removal (26 or more) ?[]  - 0 ?Hypo / Hyperglycemic Management (close monitor of Blood Glucose) ?[]  - 0 ?Ankle / Brachial Index (ABI) - do not check if billed separately ?X- 1 5 ?Vital Signs ?Has the patient been seen at the hospital within the last three years: Yes ?Total Score: 75 ?Level Of Care: New/Established - ?Level 2 ?Electronic Signature(s) ?Signed: 04/19/2022 4:04:50 PM By: Carlene Coria RN ?Entered By: Carlene Coria on 04/18/2022 15:36:03 ?[name], [name] ([id number]) ?-------------------------------------------------------------------------------- ?Encounter Discharge Information Details ?Patient Name: [name], [name] ?Date of Service: 04/18/2022 3:30 PM ?Medical Record Number: [id number] ?Patient Account Number: [id number] ?Date of Birth/Sex: 1961-01-28 (61 y.o. F) ?Treating RN: Carlene Coria ?Primary Care Ember Henrikson:  CardJenny Reichmann Other Clinician: ?Referring Jerik Falletta: Card, John ?Treating Vinson Tietze/Extender: Jeri Cos ?Weeks in Treatment: 26 ?Encounter Discharge Information Items ?Discharge Condition: Stable ?Ambulatory Status: [name] ?Discharge Destination: Home ?Transportation: Private Auto ?Accompanied By: self ?Schedule Follow-up Appointment: Yes ?Clinical Summary of Care: Patient Declined ?Electronic Signature(s) ?Signed: 04/19/2022 4:04:50 PM By: Carlene Coria RN ?Entered By: Carlene Coria on 04/18/2022 15:35:22 ?[name], [name] ([id number]) ?-------------------------------------------------------------------------------- ?Wound Assessment Details ?Patient Name: [name], [name] ?Date of Service: 04/18/2022 3:30 PM ?Medical Record Number: [id number] ?Patient Account Number: [id number] ?Date of Birth/Sex: 05/05/1961 (61 y.o. F) ?Treating RN: Carlene Coria ?Primary Care Zakiah Gauthreaux: CardJenny Reichmann Other Clinician: ?Referring Merton Wadlow: Card, John ?Treating Vikkie Goeden/Extender: Jeri Cos ?Weeks in Treatment: 26 ?Wound Status ?Wound Number: 1 ?Primary Etiology: Dehisced Wound ?Wound Location: Distal, Midline Back ?Wound Status: Open ?Wounding Event: Surgical Injury ?Comorbid History: Hypertension ?Date Acquired: 04/11/2021 ?Weeks Of Treatment: 26 ?Clustered Wound: No ?Wound Measurements ?Length: (cm) 0.3 ?Width: (cm) 0.4 ?Depth: (cm) 3.2 ?Area: (cm?) 0.094 ?Volume: (cm?) 0.302 ?% Reduction in Area: 25.4% ?% Reduction in Volume: 47.8% ?Epithelialization: None ?Tunneling: No ?Undermining: No ?Wound Description ?Classification: Full Thickness Without Exposed Support Structu ?Exudate Amount: Medium ?Exudate Type: Serous ?Exudate Color: amber ?res Foul Odor After Cleansing: No ?Slough/Fibrino No ?Wound Bed ?Granulation Amount: Large (67-100%) Exposed Structure ?Granulation Quality: Red ?Fascia Exposed: No ?Necrotic Amount: None Present (0%) ?Fat Layer (Subcutaneous Tissue) Exposed: Yes ?Tendon Exposed: No ?Muscle Exposed: No ?Joint Exposed:  No ?Bone Exposed: No ?Treatment Notes ?Wound #1 (Back) Wound Laterality: Midline, Distal ?Cleanser ?Normal Saline ?Discharge Instruction: Wash your hands with soap and water. Remove old dressing, discard into plastic

## 2022-04-19 NOTE — Therapy (Signed)
[facility] ?[facility] ?[place], [name], [address] ?Phone: [phone]   Fax:  [phone] ? ?Occupational Therapy Treatment ? ?Patient Details  ?Name: [name] ?MRN: [id number] ?Date of Birth: 08/08/1961 ?Referring Provider (OT): Crystal Arringtonm FNP ? ? ?Encounter Date: 04/19/2022 ? ? OT End of Session - 04/19/22 1111   ? ? Visit Number 12   ? Number of Visits 36   ? Date for OT Re-Evaluation 05/15/22   ? OT Start Time 1105   ? OT Stop Time 1205   ? OT Time Calculation (min) 60 min   ? Activity Tolerance Patient tolerated treatment well;No increased pain   ? Behavior During Therapy [facility] for tasks assessed/performed   ? ?  ?  ? ?  ? ? ?Past Medical History:  ?Diagnosis Date  ? Arthritis   ? Back pain   ? Chronic knee pain   ? Edema of both lower extremities   ? High blood pressure   ? High cholesterol   ? Joint pain   ? Multiple sclerosis ([place])   ? Neuromuscular disorder ([place])   ? Multiple Sclerosis over 20 years  ? Obesity   ? Vitamin D deficiency   ? ? ?Past Surgical History:  ?Procedure Laterality Date  ? APPENDECTOMY    ? CESAREAN SECTION    ? HAND SURGERY    ? lumbar back surgery    ? LUMBAR WOUND DEBRIDEMENT N/A 12/05/2020  ? Procedure: Lumbar Wound Exploration;  Surgeon: Ashok Pall, MD;  Location: [facility];  Service: Neurosurgery;  Laterality: N/A;  3C/RM 21  ? LUMBAR WOUND DEBRIDEMENT N/A 02/21/2021  ? Procedure: Lumbar wound exploration;  Surgeon: Ashok Pall, MD;  Location: [facility];  Service: Neurosurgery;  Laterality: N/A;  posterior  ? LUMBAR WOUND DEBRIDEMENT N/A 04/11/2021  ? Procedure: Wound Exploration with Wound Vac Placement;  Surgeon: Ashok Pall, MD;  Location: [facility];  Service: Neurosurgery;  Laterality: N/A;  ? ROTATOR CUFF REPAIR    ? TONSILLECTOMY    ? ? ?There were no vitals filed for this visit. ? ? Subjective Assessment - 04/19/22 1112   ? ? Subjective  [name] returns to OT for treatment visit 11/36 to address  BLE lymphedema. Pt denies LE related pain in her legs. Pt reports her doctor called in alternative pain medication and she feels much better today.   ? Pertinent History B knee OA, HTN, Obesity, MS, Spondylolisthesis-lumbar, open wound at lumbar surgical site since 4/22.   ? Limitations difficulty walking, impaired transfers, chronic OA pain in bilateral knees, non healing post surgical lumbar wound, BLE muscle weakness 2/2 MS, chronic leg swelling with minimal pain,   ? Special Tests - Stemmer bilaterally; Intake FOTO score =31/100   ? Patient Stated Goals get my legs better so I can do more   ? Pain Onset Other (comment)   6 mnths ago without precipitating event. No family hx  ? Pain Onset Other (comment)   years  ? ?  ?  ? ?  ? ? ? ? ? ? ? ? ? ? ? ? ? ? ? OT Treatments/Exercises (OP) - 04/19/22 1113   ? ?  ? ADLs  ? ADL Education Given Yes   ?  ? Manual Therapy  ? Manual Therapy Edema management   ? Manual Lymphatic Drainage (MLD) MLD to RLE/RLQ utilizing diaphragmatic breathing to access deep abdominal pathways, short neck sequence and inguinal LN, then proovided  MLD from proximal to distal addressing thigh, leg and foot. Foinished up session with short neck x 3 and deep breathing.   ? Compression Bandaging RLE multilayer compression wrap from base of toes to popliteal fossa using 1 ea 8, 10 and 12 cm wide short stretch bandage over cotton stockinette and 0.4 cm thick Rosidal foam.   ? ?  ?  ? ?  ? ? ? ? ? ? ? ? ? OT Education - 04/19/22 1114   ? ? Education Details Continued skilled Pt/caregiver education  And LE ADL training throughout visit for lymphedema self care/ home program, including compression wrapping, compression garment and device wear/care, lymphatic pumping ther ex, simple self-MLD, and skin care. Discussed initial volumetric measurements. Discussed all long term goals.   ? Person(s) Educated Patient   ? Methods Explanation;Demonstration;Handout   ? Comprehension Verbalized understanding;Returned  demonstration;Need further instruction   ? ?  ?  ? ?  ? ? ? ? ? ? OT Long Term Goals - 04/16/22 1359   ? ?  ? OT LONG TERM GOAL #1  ? Title Given this patient?s Intake score 31 /100 on the functional outcomes FOTO tool, patient will experience an increase in function of 5 points to improve basic and instrumental ADLs performance, including lymphedema self-care.   ? Baseline 31/100   ? Period Weeks   ? Status Partially Met   TBA next visit  ? Target Date 05/16/22   ?  ? OT LONG TERM GOAL #2  ? Title Pt will demonstrate understanding of lymphedema prevention strategies by identifying and discussing 5 precautions using printed reference (modified assistance) to reduce risk of progression and to limit infection risk.   ? Baseline Max A   ? Time 4   ? Period Days   ? Status Achieved   ? Target Date --   4th OT Rx visit  ?  ? OT LONG TERM GOAL #3  ? Title With Maximum caregiver assistance Pt will be able to apply multilayer, LUE compression wraps using gradient techniques to decrease limb volume, to limit infection risk, and to limit lymphedema progression. Pt unable to wrap legs independently. Trained caregiver must assist during visit intervals for CDT to be effective.   ? Baseline dependent - Max caregiver assistance is necessary to complete Intensive Phase of Complete Decongestive Therapy and Lymphedema self-care home program  as Pt is unable to reach his feet to apply compression wraps and garments, to inspect skin, to groom nails to perform skin care and inspection and to perform simple self-MLD. Pt agrees to arrange for consistent caregiver prior to commencing CDT.   ? Time 4   ? Period Days   ? Status Achieved   ? Target Date --   4th OT Rx visit  ?  ? OT LONG TERM GOAL #4  ? Title With Maximum caregiver assistance between OT sessions Pt will achieve at least a 10% limb volume reduction below the knee bilaterally to return limb to more normal size and shape, to limit infection risk, to decrease pain, to improve  function, and to limit lymphedema progression.   Pt has achieved 8% voume reduction to date , which is excellent progress towards goals.  ? Baseline dependent   ? Time 12   ? Period Weeks   ? Status Partially Met   achieved for RLE below the knee  ? Target Date 05/16/22   ?  ? OT LONG TERM GOAL #5  ? Title With caregiver  assistance  Pt will achieve and sustain a least 85% compliance with all 4 LE self-care home program components throughout Intensive Phase CDT, including modified simple self-MLD, daily skin inspection and care, lymphatic pumping the ex, 23/7 compression wraps to optimize limb volume reductions, to limit lymphedema progression and to limit further functional decline.   ? Baseline Dependent   ? Time 12   ? Period Weeks   ? Status Partially Met   ? Target Date 05/16/22   ? ?  ?  ? ?  ? ? ? ? ? ? ? ? Plan - 04/19/22 1219   ? ? Clinical Impression Statement Pt reports her recommended custom  compression garment for RLE has been ordered. Continued MLD and simultaneous skin care to RLE/ RLQ as established. Reapplied compression wraps as  Limb volume and skin condition continue to improve each visit. . Cont as epr POC.   ? OT Occupational Profile and History Detailed Assessment- Review of Records and additional review of physical, cognitive, psychosocial history related to current functional performance   ? Occupational performance deficits (Please refer to evaluation for details): ADL's;IADL's;Rest and Sleep;Leisure;Social Participation;Work   ? Rehab Potential Fair   ? Clinical Decision Making Several treatment options, min-mod task modification necessary   ? Comorbidities Affecting Occupational Performance: May have comorbidities impacting occupational performance   ? Modification or Assistance to Complete Evaluation  Min-Moderate modification of tasks or assist with assess necessary to complete eval   ? OT Frequency 2x / week   ? OT Duration 12 weeks   ? OT Treatment/Interventions Self-care/ADL  training;DME and/or AE instruction;Manual lymph drainage;Compression bandaging;Therapeutic activities;Coping strategies training;Therapeutic exercise;Other (comment);Manual Therapy;Energy conservation;Patient/f

## 2022-04-22 ENCOUNTER — Encounter: Payer: 59 | Admitting: Occupational Therapy

## 2022-04-23 ENCOUNTER — Ambulatory Visit: Payer: 59 | Admitting: Occupational Therapy

## 2022-04-23 ENCOUNTER — Encounter: Payer: 59 | Admitting: Physician Assistant

## 2022-04-23 DIAGNOSIS — T8131XA Disruption of external operation (surgical) wound, not elsewhere classified, initial encounter: Secondary | ICD-10-CM | POA: Diagnosis not present

## 2022-04-23 DIAGNOSIS — I890 Lymphedema, not elsewhere classified: Secondary | ICD-10-CM | POA: Diagnosis not present

## 2022-04-23 NOTE — Therapy (Signed)
[facility] ?[facility] ?[place], [name], [address] ?Phone: [phone]   Fax:  [phone] ? ?Occupational Therapy Treatment ? ?Patient Details  ?Name: [name] ?MRN: [id number] ?Date of Birth: 1961/09/07 ?Referring Provider (OT): Crystal Arringtonm FNP ? ? ?Encounter Date: 04/23/2022 ? ? OT End of Session - 04/23/22 1313   ? ? Visit Number 12   ? Number of Visits 36   ? Date for OT Re-Evaluation 05/15/22   ? OT Start Time 0105   ? OT Stop Time 0212   ? OT Time Calculation (min) 67 min   ? Activity Tolerance Patient tolerated treatment well;No increased pain   ? Behavior During Therapy [facility] for tasks assessed/performed   ? ?  ?  ? ?  ? ? ?Past Medical History:  ?Diagnosis Date  ? Arthritis   ? Back pain   ? Chronic knee pain   ? Edema of both lower extremities   ? High blood pressure   ? High cholesterol   ? Joint pain   ? Multiple sclerosis ([place])   ? Neuromuscular disorder ([place])   ? Multiple Sclerosis over 20 years  ? Obesity   ? Vitamin D deficiency   ? ? ?Past Surgical History:  ?Procedure Laterality Date  ? APPENDECTOMY    ? CESAREAN SECTION    ? HAND SURGERY    ? lumbar back surgery    ? LUMBAR WOUND DEBRIDEMENT N/A 12/05/2020  ? Procedure: Lumbar Wound Exploration;  Surgeon: Ashok Pall, MD;  Location: [facility];  Service: Neurosurgery;  Laterality: N/A;  3C/RM 21  ? LUMBAR WOUND DEBRIDEMENT N/A 02/21/2021  ? Procedure: Lumbar wound exploration;  Surgeon: Ashok Pall, MD;  Location: [facility];  Service: Neurosurgery;  Laterality: N/A;  posterior  ? LUMBAR WOUND DEBRIDEMENT N/A 04/11/2021  ? Procedure: Wound Exploration with Wound Vac Placement;  Surgeon: Ashok Pall, MD;  Location: [facility];  Service: Neurosurgery;  Laterality: N/A;  ? ROTATOR CUFF REPAIR    ? TONSILLECTOMY    ? ? ?There were no vitals filed for this visit. ? ? Subjective Assessment - 04/23/22 1314   ? ? Subjective  [name] returns to OT for treatment visit 11/36 to address  BLE lymphedema. Pt denies LE related pain in her legs. Pt reports her doctor called in alternative pain medication and she feels much better today.   ? Pertinent History B knee OA, HTN, Obesity, MS, Spondylolisthesis-lumbar, open wound at lumbar surgical site since 4/22.   ? Limitations difficulty walking, impaired transfers, chronic OA pain in bilateral knees, non healing post surgical lumbar wound, BLE muscle weakness 2/2 MS, chronic leg swelling with minimal pain,   ? Special Tests - Stemmer bilaterally; Intake FOTO score =31/100   ? Patient Stated Goals get my legs better so I can do more   ? Pain Onset Other (comment)   6 mnths ago without precipitating event. No family hx  ? Pain Onset Other (comment)   years  ? ?  ?  ? ?  ? ? ? ? ? ? ? ? ? ? ? ? ? ? ? OT Treatments/Exercises (OP) - 04/23/22 1440   ? ?  ? ADLs  ? ADL Education Given Yes   ?  ? Manual Therapy  ? Manual Therapy Edema management   ? Manual Lymphatic Drainage (MLD) MLD to RLE/RLQ utilizing diaphragmatic breathing to access deep abdominal pathways, short neck sequence and inguinal LN, then proovided  MLD from proximal to distal addressing thigh, leg and foot. Foinished up session with short neck x 3 and deep breathing.   ? Compression Bandaging RLE multilayer compression wrap from base of toes to popliteal fossa using 1 ea 8, 10 and 12 cm wide short stretch bandage over cotton stockinette and 0.4 cm thick Rosidal foam.   ? ?  ?  ? ?  ? ? ? ? ? ? ? ? ? OT Education - 04/23/22 1441   ? ? Education Details Continued skilled Pt/caregiver education  And LE ADL training throughout visit for lymphedema self care/ home program, including compression wrapping, compression garment and device wear/care, lymphatic pumping ther ex, simple self-MLD, and skin care. Discussed initial volumetric measurements. Discussed all long term goals.   ? Person(s) Educated Patient   ? Methods Explanation;Demonstration;Handout   ? Comprehension Verbalized understanding;Returned  demonstration;Need further instruction   ? ?  ?  ? ?  ? ? ? ? ? ? OT Long Term Goals - 04/16/22 1359   ? ?  ? OT LONG TERM GOAL #1  ? Title Given this patient?s Intake score 31 /100 on the functional outcomes FOTO tool, patient will experience an increase in function of 5 points to improve basic and instrumental ADLs performance, including lymphedema self-care.   ? Baseline 31/100   ? Period Weeks   ? Status Partially Met   TBA next visit  ? Target Date 05/16/22   ?  ? OT LONG TERM GOAL #2  ? Title Pt will demonstrate understanding of lymphedema prevention strategies by identifying and discussing 5 precautions using printed reference (modified assistance) to reduce risk of progression and to limit infection risk.   ? Baseline Max A   ? Time 4   ? Period Days   ? Status Achieved   ? Target Date --   4th OT Rx visit  ?  ? OT LONG TERM GOAL #3  ? Title With Maximum caregiver assistance Pt will be able to apply multilayer, LUE compression wraps using gradient techniques to decrease limb volume, to limit infection risk, and to limit lymphedema progression. Pt unable to wrap legs independently. Trained caregiver must assist during visit intervals for CDT to be effective.   ? Baseline dependent - Max caregiver assistance is necessary to complete Intensive Phase of Complete Decongestive Therapy and Lymphedema self-care home program  as Pt is unable to reach his feet to apply compression wraps and garments, to inspect skin, to groom nails to perform skin care and inspection and to perform simple self-MLD. Pt agrees to arrange for consistent caregiver prior to commencing CDT.   ? Time 4   ? Period Days   ? Status Achieved   ? Target Date --   4th OT Rx visit  ?  ? OT LONG TERM GOAL #4  ? Title With Maximum caregiver assistance between OT sessions Pt will achieve at least a 10% limb volume reduction below the knee bilaterally to return limb to more normal size and shape, to limit infection risk, to decrease pain, to improve  function, and to limit lymphedema progression.   Pt has achieved 8% voume reduction to date , which is excellent progress towards goals.  ? Baseline dependent   ? Time 12   ? Period Weeks   ? Status Partially Met   achieved for RLE below the knee  ? Target Date 05/16/22   ?  ? OT LONG TERM GOAL #5  ? Title With caregiver  assistance  Pt will achieve and sustain a least 85% compliance with all 4 LE self-care home program components throughout Intensive Phase CDT, including modified simple self-MLD, daily skin inspection and care, lymphatic pumping the ex, 23/7 compression wraps to optimize limb volume reductions, to limit lymphedema progression and to limit further functional decline.   ? Baseline Dependent   ? Time 12   ? Period Weeks   ? Status Partially Met   ? Target Date 05/16/22   ? ?  ?  ? ?  ? ? ? ? ? ? ? ? Plan - 04/23/22 1439   ? ? Clinical Impression Statement Provided MLD and simultaneous skin care to RLE/ RLQ as established. Reapplied compressio9n wraps as established. Limb volume and skin condition continue to improve each visit. Pt remains compliant with aspects of home program she has learned thus far. Fit R [employer]om [employer] compression stocking as soon as it's available from DME vendor. Cont as per POC.   ? OT Occupational Profile and History Detailed Assessment- Review of Records and additional review of physical, cognitive, psychosocial history related to current functional performance   ? Occupational performance deficits (Please refer to evaluation for details): ADL's;IADL's;Rest and Sleep;Leisure;Social Participation;Work   ? Rehab Potential Fair   ? Clinical Decision Making Several treatment options, min-mod task modification necessary   ? Comorbidities Affecting Occupational Performance: May have comorbidities impacting occupational performance   ? Modification or Assistance to Complete Evaluation  Min-Moderate modification of tasks or assist with assess necessary to complete eval   ? OT  Frequency 2x / week   ? OT Duration 12 weeks   ? OT Treatment/Interventions Self-care/ADL training;DME and/or AE instruction;Manual lymph drainage;Compression bandaging;Therapeutic activities;Coping strategies

## 2022-04-23 NOTE — Progress Notes (Addendum)
Mervin, Alyxis C. ([id number]) ?Visit Report for 04/23/2022 ?Chief Complaint Document Details ?Patient Name: [name], [name] ?Date of Service: 04/23/2022 11:00 AM ?Medical Record Number: [id number] ?Patient Account Number: [id number] ?Date of Birth/Sex: [birth year] (61 y.o. F) ?Treating RN: Yevonne Pax ?Primary Care Provider: CardJonny Ruiz Other Clinician: ?Referring Provider: Card, John ?Treating Provider/Extender: Allen Derry ?Weeks in Treatment: 27 ?Information Obtained from: Patient ?Chief Complaint ?Surgical Back Ulcer ?Electronic Signature(s) ?Signed: 04/23/2022 10:58:50 AM By: [name] PA-C ?Entered By: [name] on 04/23/2022 10:58:50 ?Mehl, Ionia C. ([id number]) ?-------------------------------------------------------------------------------- ?HPI Details ?Patient Name: [name], [name] ?Date of Service: 04/23/2022 11:00 AM ?Medical Record Number: [id number] ?Patient Account Number: [id number] ?Date of Birth/Sex: 1961-04-08 (61 y.o. F) ?Treating RN: Yevonne Pax ?Primary Care Provider: CardJonny Ruiz Other Clinician: ?Referring Provider: Card, John ?Treating Provider/Extender: Allen Derry ?Weeks in Treatment: 27 ?History of Present Illness ?HPI Description: 10/15/2021 upon evaluation today patient presents for initial evaluation here in the clinic concerning a surgical ?ulceration/dehiscence in the lumbar spine region following surgery that she had over the past year. This was actually broken up into 3 separate ?surgical events. The initial surgical intervention actually was on November 05, 2021 almost a year ago. Subsequently the patient went back in ?February for a seroma of the area which unfortunately required her to have a repeat surgery to go in and clean this out. And then again this ?occurred in April where she went back in and again they felt like stitches were coming out and there was an additional seroma. She was placed in ?a wound VAC initially and then subsequently as it got smaller that  was discontinued. Again right now I will see anything that I think a wound VAC ?would help with. Nonetheless she definitely has a significant depth to the wound that is going require packing. I actually believe the Hydrofera ?Blue rope would probably do quite well with this the problem is as much as it is draining she probably needs this to be changed at least every day. ?She does not really have anyone that can help with that that is the complicating scenario here. With that being said the patient does have a ?history of multiple sclerosis, hypertension, and again this surgical wound dehiscence in regard to her lumbar spine region. She did have a repeat ?MRI which was actually completed 10/09/2021. This showed that there was no significant change in the subcutaneous fluid collection/track of the ?lower lumbar region. This is extending to the level of the fascia unfortunately. This seems to go all the way from the L2 level with a track ?extending all the way to the fascia at the L4-5 level. Again this is a significant wound and there is significant drainage but does not seem to ?communicate to the spinal region as far as spinal fluid or otherwise is concerned that is good news. Nonetheless she last saw Dr. Franky Macho who is ?her neurosurgeon on 10/01/2021 that was when he ordered this last MRI she supposed to see him next week as well. With that being said he did ?not feel like there was any significant issue there but was not sure why this was not healing that is when he ordered the MRI. They were wanting ?to make sure that this was packed appropriately by home health unfortunately the main issue currently is that home health is completely out of ?the picture as the patient has exhausted all the home health that that she gets for a year. She is now in a very difficult  predicament where she ?does not have anyone to help her change the dressing and to be honest that she is not able to do it herself with the location of  the wound being ?on the midline lumbar spine region. If she does not have anyone that can help it is probably can to be necessary for her to go to a facility for ?rehab and daily dressing changes as I feel like daily changes which is much drainage that she is having is going to be necessary. ?10/23/2021 upon evaluation today patient appears to be doing decently well in regard to her back ulcer. This does seem to be draining a lot less ?than what it is been doing in the past. With that being said she still has quite a bit of drainage nonetheless. I do think that given time this should ?improve least I hope so. The good news is she does have home health coming out 3 days a week were doing it 2 days a week and she is paying ?someone we can to help. ?10/30/2021 upon evaluation today patient appears to be doing okay in regard to her back ulcer this is not draining quite as bad as it was in the ?beginning but he still has quite a bit of drainage noted. I do believe that the patient would benefit from [place] going ahead forward with attempting a ?wound VAC using the Hydrofera Blue rope to pack with and then subsequently using the VAC externally to actually suction out and help this to fill- ?in. I think this is our ideal way to try to get things cleared at this point. As it stands I am not certain that we are really making a progress that we ?want to see near with doing it in the way we are which is packing with the rope. It is a good dressing but I do think it is insufficient for total ?healing. She just seems to have too much in the way of drainage at this point unfortunately. ?11/06/2021 upon evaluation today patient unfortunately continues to have issues with her back ongoing. The good news is her MRI that was ?repeated showed signs of the size of this area in the lumbar spine region having decreased from 4 cm to 3.5 cm this is definitely not bad news at ?all. With that being said unfortunately she continues to have issues  with ongoing drainage not as severe as in the beginning but nonetheless still ?significant. I do think a wound VAC still would be a good way to go although her home health agency nurse apparently has some concerns about ?the possibility of not being able to keep a seal with this as they had struggles in the past. Nonetheless I explained to the patient that this is much ?different than what she had previous and that I really feel like it would do much better as far as getting the area taken care of without having any ?complications or issues here. I think that we should be able to maintain a seal. Nonetheless at this time I did discuss with the patient as well that ?she probably does need to have a wound VAC in order for [place] to get this moving in the right direction. ?11/13/2021 upon evaluation patient's wound bed actually showed signs of significant drainage at this time. She did see the surgeon yesterday he ?did not see anything that appeared to be infected. Nonetheless he does appear that she is continuing to have areas here that just do not seem  to ?want to seal up there MRI findings have been negative but nonetheless she continues to have is the seroma that is filling in. I do feel like we need ?to try to widen the hole so we can get at least a half of the [facility] then this will be better than nothing at this point. ?11/20/2021 upon evaluation today patient actually appears to be having less pain at this point which is good news and overall she we still do not ?have the results of the culture back yet it had to be sent out to [facility] and we do not have the result back yet. Is doing decently well in regard to ?her wound. Fortunately there does not appear to be any signs of active infection systemically nor locally at this time. ?11/27/2021 upon evaluation today patient appears to be doing well with regard to her wound all things considered there does appear to be less ?drainage than there was previous.  Fortunately I do not see any evidence of worsening of the patient is stating that she is having some issues with ?back pain. This is somewhat new. Again this I think could be related to the fact that she is

## 2022-04-23 NOTE — Progress Notes (Addendum)
Magel, Cerise C. ([id number]) ?Visit Report for 04/23/2022 ?Arrival Information Details ?Patient Name: [name], [name] ?Date of Service: 04/23/2022 11:00 AM ?Medical Record Number: [id number] ?Patient Account Number: [id number] ?Date of Birth/Sex: 05/13/1961 (61 y.o. F) ?Treating RN: Yevonne Pax ?Primary Care [name]: CardJonny Ruiz Other Clinician: ?Referring Ricco Dershem: [name], [name] ?Treating [name]/Extender: [name] ?Weeks in Treatment: 27 ?Visit Information History Since Last Visit ?All ordered tests and consults were completed: No ?Patient Arrived: Ambulatory ?Added or deleted any medications: No ?Arrival Time: 11:01 ?Any new allergies or adverse reactions: No ?Accompanied By: self ?Had a fall or experienced change in No ?Transfer Assistance: None ?activities of daily living that may affect ?Patient Identification Verified: Yes ?risk of falls: ?Secondary Verification Process Completed: Yes ?Signs or symptoms of abuse/neglect since last visito No ?Patient Requires Transmission-Based Precautions: No ?Hospitalized since last visit: No ?Patient Has Alerts: No ?Implantable device outside of the clinic excluding No ?cellular tissue based products placed in the center ?since last visit: ?Has Dressing in Place as Prescribed: Yes ?Pain Present Now: No ?Electronic Signature(s) ?Signed: 04/24/2022 8:47:39 AM By: Yevonne Pax RN ?Entered By: Yevonne Pax on 04/23/2022 11:02:04 ?[name], [name] ([id number]) ?-------------------------------------------------------------------------------- ?Clinic Level of Care Assessment Details ?Patient Name: [name] ?Date of Service: 04/23/2022 11:00 AM ?Medical Record Number: [id number] ?Patient Account Number: [id number] ?Date of Birth/Sex: [birth year] (61 y.o. F) ?Treating RN: Yevonne Pax ?Primary Care Aarvi Stotts: CardJonny Ruiz Other Clinician: ?Referring Aryon Nham: [name], [name] ?Treating Bethanee Redondo/Extender: [name] ?Weeks in Treatment: 27 ?Clinic Level of Care Assessment  Items ?TOOL 4 Quantity Score ?X - Use when only an EandM is performed on FOLLOW-UP visit 1 0 ?ASSESSMENTS - Nursing Assessment / Reassessment ?X - Reassessment of Co-morbidities (includes updates in patient status) 1 10 ?X- 1 5 ?Reassessment of Adherence to Treatment Plan ?ASSESSMENTS - Wound and Skin Assessment / Reassessment ?X - Simple Wound Assessment / Reassessment - one wound 1 5 ?[]  - 0 ?Complex Wound Assessment / Reassessment - multiple wounds ?[]  - 0 ?Dermatologic / Skin Assessment (not related to wound area) ?ASSESSMENTS - Focused Assessment ?[]  - Circumferential Edema Measurements - multi extremities 0 ?[]  - 0 ?Nutritional Assessment / Counseling / Intervention ?[]  - 0 ?[name] Extremity Assessment (monofilament, tuning fork, pulses) ?[]  - 0 ?Peripheral Arterial Disease Assessment (using hand held doppler) ?ASSESSMENTS - Ostomy and/or Continence Assessment and Care ?[]  - Incontinence Assessment and Management 0 ?[]  - 0 ?Ostomy Care Assessment and Management (repouching, etc.) ?PROCESS - Coordination of Care ?X - Simple Patient / Family Education for ongoing care 1 15 ?[]  - 0 ?Complex (extensive) Patient / Family Education for ongoing care ?[]  - 0 ?Staff obtains Consents, Records, Test Results / Process Orders ?[]  - 0 ?Staff telephones HHA, Nursing Homes / Clarify orders / etc ?[]  - 0 ?Routine Transfer to another Facility (non-emergent condition) ?[]  - 0 ?Routine Hospital Admission (non-emergent condition) ?[]  - 0 ?New Admissions / / Ordering NPWT, Apligraf, etc. ?[]  - 0 ?Emergency Hospital Admission (emergent condition) ?X- 1 10 ?Simple Discharge Coordination ?[]  - 0 ?Complex (extensive) Discharge Coordination ?PROCESS - Special Needs ?[]  - Pediatric / Minor Patient Management 0 ?[]  - 0 ?Isolation Patient Management ?[]  - 0 ?Hearing / Language / Visual special needs ?[]  - 0 ?Assessment of Community assistance (transportation, D/C planning, etc.) ?[]  - 0 ?Additional assistance /  Altered mentation ?[]  - 0 ?Support Surface(s) Assessment (bed, cushion, seat, etc.) ?INTERVENTIONS - Wound Cleansing / Measurement ?[name], [name] ( ) ?X- 1 5 ?Simple Wound Cleansing -  one wound ?[]  - 0 ?Complex Wound Cleansing - multiple wounds ?X- 1 5 ?Wound Imaging (photographs - any number of wounds) ?[]  - 0 ?Wound Tracing (instead of photographs) ?X- 1 5 ?Simple Wound Measurement - one wound ?[]  - 0 ?Complex Wound Measurement - multiple wounds ?INTERVENTIONS - Wound Dressings ?[]  - Small Wound Dressing one or multiple wounds 0 ?X- 1 15 ?Medium Wound Dressing one or multiple wounds ?[]  - 0 ?Large Wound Dressing one or multiple wounds ?[]  - 0 ?Application of Medications - topical ?[]  - 0 ?Application of Medications - injection ?INTERVENTIONS - Miscellaneous ?[]  - External ear exam 0 ?[]  - 0 ?Specimen Collection (cultures, biopsies, blood, body fluids, etc.) ?[]  - 0 ?Specimen(s) / Culture(s) sent or taken to Lab for analysis ?[]  - 0 ?Patient Transfer (multiple staff / / Similar devices) ?[]  - 0 ?Simple Staple / Suture removal (25 or less) ?[]  - 0 ?Complex Staple / Suture removal (26 or more) ?[]  - 0 ?Hypo / Hyperglycemic Management (close monitor of Blood Glucose) ?[]  - 0 ?Ankle / Brachial Index (ABI) - do not check if billed separately ?X- 1 5 ?Vital Signs ?Has the patient been seen at the hospital within the last three years: Yes ?Total Score: 80 ?Level Of Care: New/Established - Level ?3 ?Electronic Signature(s) ?Signed: 04/24/2022 8:47:39 AM By: RN ?Entered By: on 04/23/2022 11:32:05 ?[name], [name] ( ) ?-------------------------------------------------------------------------------- ?Encounter Discharge Information Details ?Patient Name: [name], [name] ?Date of Service: 04/23/2022 11:00 AM ?Medical Record Number: ?Patient Account Number: ?Date of Birth/Sex: [birth year] (61 y.o. F) ?Treating RN: ?Primary Care  Sarin Comunale: [name] Other Clinician: ?Referring Jaasiel Hollyfield: [name], [name] ?Treating Jhace Fennell/Extender: ?Weeks in Treatment: 27 ?Encounter Discharge Information Items ?Discharge Condition: Stable ?Ambulatory Status: 04/26/2022 ?Discharge Destination: Home ?Transportation: Private Auto ?Accompanied By: self ?Schedule Follow-up Appointment: Yes ?Clinical Summary of Care: Patient Declined ?Electronic Signature(s) ?Signed: 04/24/2022 8:47:39 AM By: Yevonne Pax RN ?Entered By: 04/25/2022 on 04/23/2022 11:33:43 ?[name], [name] (Lowella Grip) ?-------------------------------------------------------------------------------- ?[name] Extremity Assessment Details ?Patient Name: [name], [name] ?Date of Service: 04/23/2022 11:00 AM ?Medical Record Number: [id number] ?Patient Account Number: 09/11/1961 ?Date of Birth/Sex: [birth year] (61 y.o. F) ?Treating RN: Jonny Ruiz ?Primary Care Taelynn Mcelhannon: CardAllen [name] Other Clinician: ?Referring Chong Wojdyla: [name], [name] ?Treating Lativia Velie/Extender: 28 ?Weeks in Treatment: 27 ?Electronic Signature(s) ?Signed: 04/24/2022 8:47:39 AM By: Yevonne Pax RN ?Entered By: Yevonne Pax on 04/23/2022 11:06:20 ?[name], [name] ([id number]) ?-------------------------------------------------------------------------------- ?Multi Wound Chart Details ?Patient Name: [name], [name] ?Date of Service: 04/23/2022 11:00 AM ?Medical Record Number: [id number] ?Patient Account Number: [id number] ?Date of Birth/Sex: [birth year] (61 y.o. F) ?Treating RN: Yevonne Pax ?Primary Care Alyannah Sanks: CardJonny Ruiz Other Clinician: ?Referring Meilech Virts: [name], [name] ?Treating Tiburcio Linder/Extender: [name] ?Weeks in Treatment: 27 ?Vital Signs ?Height(in): ?Pulse(bpm): 79 ?Weight(lbs): 275 ?Blood Pressure(mmHg): 144/79 ?Body Mass Index(BMI): ?Temperature(??F): 98 ?Respiratory Rate(breaths/min): 18 ?Photos: [N/A:N/A] ?Wound Location: Distal, Midline Back N/A N/A ?Wounding Event: Surgical Injury N/A N/A ?Primary Etiology:  Dehisced Wound N/A N/A ?Comorbid History: Hypertension N/A N/A ?Date Acquired: 04/11/2021 N/A N/A ?Weeks of Treatment: 27 N/A N/A ?Wound Status: Open N/A N/A ?Wound Recurrence: No N/A N/A ?Measurements L x W x D (cm) 0.3x

## 2022-04-23 NOTE — Patient Instructions (Signed)

## 2022-04-24 ENCOUNTER — Ambulatory Visit: Payer: 59 | Admitting: Occupational Therapy

## 2022-04-24 ENCOUNTER — Encounter: Payer: 59 | Admitting: Occupational Therapy

## 2022-04-25 DIAGNOSIS — T8131XA Disruption of external operation (surgical) wound, not elsewhere classified, initial encounter: Secondary | ICD-10-CM | POA: Diagnosis not present

## 2022-04-25 NOTE — Progress Notes (Signed)
Stroot, Michaela C. ([id number]) ?Visit Report for 04/25/2022 ?Arrival Information Details ?Patient Name: [name], [name] ?Date of Service: 04/25/2022 3:30 PM ?Medical Record Number: [id number] ?Patient Account Number: [id number] ?Date of Birth/Sex: 1961/07/01 (61 y.o. F) ?Treating RN: Yevonne Pax ?Primary Care Valincia Touch: CardJonny Ruiz Other Clinician: ?Referring Tashe Purdon: Card, John ?Treating Kregg Cihlar/Extender: Maxwell Caul ?Weeks in Treatment: 27 ?Visit Information History Since Last Visit ?All ordered tests and consults were completed: No ?Patient Arrived: [name] ?Added or deleted any medications: No ?Arrival Time: 15:45 ?Any new allergies or adverse reactions: No ?Accompanied By: self ?Had a fall or experienced change in No ?Transfer Assistance: None ?activities of daily living that may affect ?Patient Identification Verified: Yes ?risk of falls: ?Secondary Verification Process Completed: Yes ?Signs or symptoms of abuse/neglect since last visito No ?Patient Requires Transmission-Based Precautions: No ?Hospitalized since last visit: No ?Patient Has Alerts: No ?Implantable device outside of the clinic excluding No ?cellular tissue based products placed in the center ?since last visit: ?Has Dressing in Place as Prescribed: Yes ?Pain Present Now: No ?Electronic Signature(s) ?Signed: 04/25/2022 4:16:23 PM By: Yevonne Pax RN ?Entered By: Yevonne Pax on 04/25/2022 16:16:23 ?Fuertes, Jazmarie C. ([id number]) ?-------------------------------------------------------------------------------- ?Clinic Level of Care Assessment Details ?Patient Name: [name], [name] ?Date of Service: 04/25/2022 3:30 PM ?Medical Record Number: [id number] ?Patient Account Number: [id number] ?Date of Birth/Sex: 04-May-1961 (61 y.o. F) ?Treating RN: Yevonne Pax ?Primary Care Clayson Riling: CardJonny Ruiz Other Clinician: ?Referring Krystena Reitter: Card, John ?Treating Catharine Kettlewell/Extender: Maxwell Caul ?Weeks in Treatment: 27 ?Clinic Level of Care Assessment  Items ?TOOL 4 Quantity Score ?X - Use when only an EandM is performed on FOLLOW-UP visit 1 0 ?ASSESSMENTS - Nursing Assessment / Reassessment ?X - Reassessment of Co-morbidities (includes updates in patient status) 1 10 ?X- 1 5 ?Reassessment of Adherence to Treatment Plan ?ASSESSMENTS - Wound and Skin Assessment / Reassessment ?X - Simple Wound Assessment / Reassessment - one wound 1 5 ?[]  - 0 ?Complex Wound Assessment / Reassessment - multiple wounds ?[]  - 0 ?Dermatologic / Skin Assessment (not related to wound area) ?ASSESSMENTS - Focused Assessment ?[]  - Circumferential Edema Measurements - multi extremities 0 ?[]  - 0 ?Nutritional Assessment / Counseling / Intervention ?[]  - 0 ?Lower Extremity Assessment (monofilament, tuning fork, pulses) ?[]  - 0 ?Peripheral Arterial Disease Assessment (using hand held doppler) ?ASSESSMENTS - Ostomy and/or Continence Assessment and Care ?[]  - Incontinence Assessment and Management 0 ?[]  - 0 ?Ostomy Care Assessment and Management (repouching, etc.) ?PROCESS - Coordination of Care ?X - Simple Patient / Family Education for ongoing care 1 15 ?[]  - 0 ?Complex (extensive) Patient / Family Education for ongoing care ?[]  - 0 ?Staff obtains Consents, Records, Test Results / Process Orders ?[]  - 0 ?Staff telephones HHA, Nursing Homes / Clarify orders / etc ?[]  - 0 ?Routine Transfer to another Facility (non-emergent condition) ?[]  - 0 ?Routine Hospital Admission (non-emergent condition) ?[]  - 0 ?New Admissions / / Ordering NPWT, Apligraf, etc. ?[]  - 0 ?Emergency Hospital Admission (emergent condition) ?[]  - 0 ?Simple Discharge Coordination ?[]  - 0 ?Complex (extensive) Discharge Coordination ?PROCESS - Special Needs ?[]  - Pediatric / Minor Patient Management 0 ?[]  - 0 ?Isolation Patient Management ?[]  - 0 ?Hearing / Language / Visual special needs ?[]  - 0 ?Assessment of Community assistance (transportation, D/C planning, etc.) ?[]  - 0 ?Additional assistance /  Altered mentation ?[]  - 0 ?Support Surface(s) Assessment (bed, cushion, seat, etc.) ?INTERVENTIONS - Wound Cleansing / Measurement ?Sobel, Henleigh C. ( ) ?X- 1 5 ?Simple Wound  Cleansing - one wound ?[]  - 0 ?Complex Wound Cleansing - multiple wounds ?[]  - 0 ?Wound Imaging (photographs - any number of wounds) ?[]  - 0 ?Wound Tracing (instead of photographs) ?X- 1 5 ?Simple Wound Measurement - one wound ?[]  - 0 ?Complex Wound Measurement - multiple wounds ?INTERVENTIONS - Wound Dressings ?[]  - Small Wound Dressing one or multiple wounds 0 ?X- 1 15 ?Medium Wound Dressing one or multiple wounds ?[]  - 0 ?Large Wound Dressing one or multiple wounds ?[]  - 0 ?Application of Medications - topical ?[]  - 0 ?Application of Medications - injection ?INTERVENTIONS - Miscellaneous ?[]  - External ear exam 0 ?[]  - 0 ?Specimen Collection (cultures, biopsies, blood, body fluids, etc.) ?[]  - 0 ?Specimen(s) / Culture(s) sent or taken to Lab for analysis ?[]  - 0 ?Patient Transfer (multiple staff / / Similar devices) ?[]  - 0 ?Simple Staple / Suture removal (25 or less) ?[]  - 0 ?Complex Staple / Suture removal (26 or more) ?[]  - 0 ?Hypo / Hyperglycemic Management (close monitor of Blood Glucose) ?[]  - 0 ?Ankle / Brachial Index (ABI) - do not check if billed separately ?[]  - 0 ?Vital Signs ?Has the patient been seen at the hospital within the last three years: Yes ?Total Score: 60 ?Level Of Care: New/Established - ?Level 2 ?Electronic Signature(s) ?Unsigned ?Entered By: on 04/25/2022 16:22:05 ?Signature(s): ?Date(s): ?Carbine, Clois C. ( ) ?-------------------------------------------------------------------------------- ?Encounter Discharge Information Details ?Patient Name: [name], [name] ?Date of Service: 04/25/2022 3:30 PM ?Medical Record Number: ?Patient Account Number: ?Date of Birth/Sex: 16-Jan-1961 (61 y.o. F) ?Treating RN: ?Primary Care Michel Eskelson: Card  Other Clinician: ?Referring Ashya Nicolaisen: Card, John ?Treating Jensyn Shave/Extender: Nurse, adult ?Weeks in Treatment: 27 ?Encounter Discharge Information Items ?Discharge Condition: Stable ?Ambulatory Status: ?Discharge Destination: Home ?Transportation: Private Auto ?Accompanied By: self ?Schedule Follow-up Appointment: Yes ?Clinical Summary of Care: Patient Declined ?Electronic Signature(s) ?Signed: 04/25/2022 4:21:25 PM By: RN ?Entered By: on 04/25/2022 16:21:25 ?Stoneking, Laiyah C. (04/27/2022) ?-------------------------------------------------------------------------------- ?Wound Assessment Details ?Patient Name: [name], [name] ?Date of Service: 04/25/2022 3:30 PM ?Medical Record Number: 04/27/2022 ?Patient Account Number: [id number] ?Date of Birth/Sex: 1961-12-25 (61 y.o. F) ?Treating RN: ([id number] ?Primary Care Anureet Bruington: CardYevonne Pax Other Clinician: ?Referring Torrie Namba: Card, John ?Treating Britne Borelli/Extender: Jonny Ruiz ?Weeks in Treatment: 27 ?Wound Status ?Wound Number: 1 ?Primary Etiology: Dehisced Wound ?Wound Location: Distal, Midline Back ?Wound Status: Open ?Wounding Event: Surgical Injury ?Comorbid History: Hypertension ?Date Acquired: 04/11/2021 ?Weeks Of Treatment: 27 ?Clustered Wound: No ?Wound Measurements ?Length: (cm) 0.3 ?Width: (cm) 0.4 ?Depth: (cm) 3.2 ?Area: (cm?) 0.094 ?Volume: (cm?) 0.302 ?% Reduction in Area: 25.4% ?% Reduction in Volume: 47.8% ?Epithelialization: None ?Tunneling: No ?Undermining: No ?Wound Description ?Classification: Full Thickness Without Exposed Support Structu ?Exudate Amount: Medium ?Exudate Type: Serous ?Exudate Color: amber ?res Foul Odor After Cleansing: No ?Slough/Fibrino No ?Wound Bed ?Granulation Amount: Large (67-100%) Exposed Structure ?Granulation Quality: Red ?Fascia Exposed: No ?Necrotic Amount: None Present (0%) ?Fat Layer (Subcutaneous Tissue) Exposed: Yes ?Tendon Exposed: No ?Muscle Exposed: No ?Joint Exposed:  No ?Bone Exposed: No ?Treatment Notes ?Wound #1 (Back) Wound Laterality: Midline, Distal ?Cleanser ?Normal Saline ?Discharge Instruction: Wash your hands with soap and water. Remove old dressing, discard into

## 2022-04-26 ENCOUNTER — Encounter: Payer: 59 | Admitting: Occupational Therapy

## 2022-04-30 ENCOUNTER — Encounter: Payer: 59 | Attending: Physician Assistant | Admitting: Physician Assistant

## 2022-04-30 ENCOUNTER — Ambulatory Visit: Payer: 59 | Attending: Geriatric Medicine | Admitting: Occupational Therapy

## 2022-04-30 DIAGNOSIS — G35 Multiple sclerosis: Secondary | ICD-10-CM | POA: Diagnosis not present

## 2022-04-30 DIAGNOSIS — X58XXXA Exposure to other specified factors, initial encounter: Secondary | ICD-10-CM | POA: Insufficient documentation

## 2022-04-30 DIAGNOSIS — I10 Essential (primary) hypertension: Secondary | ICD-10-CM | POA: Diagnosis not present

## 2022-04-30 DIAGNOSIS — T8131XA Disruption of external operation (surgical) wound, not elsewhere classified, initial encounter: Secondary | ICD-10-CM | POA: Insufficient documentation

## 2022-04-30 DIAGNOSIS — I890 Lymphedema, not elsewhere classified: Secondary | ICD-10-CM | POA: Diagnosis present

## 2022-04-30 DIAGNOSIS — L98422 Non-pressure chronic ulcer of back with fat layer exposed: Secondary | ICD-10-CM | POA: Insufficient documentation

## 2022-04-30 NOTE — Progress Notes (Addendum)
Ciullo, Yulissa C. ([id number]) ?Visit Report for 04/30/2022 ?Arrival Information Details ?Patient Name: [name], SPIEWAK ?Date of Service: 04/30/2022 11:00 AM ?Medical Record Number: [id number] ?Patient Account Number: [id number] ?Date of Birth/Sex: 1961/06/01 (61 y.o. F) ?Treating RN: Huel Coventry ?Primary Care Shaka Cardin: CardJonny Ruiz Other Clinician: ?Referring Rovena Hearld: Card, John ?Treating Raylee Adamec/Extender: Allen Derry ?Weeks in Treatment: 28 ?Visit Information History Since Last Visit ?Added or deleted any medications: No ?Patient Arrived: [name] ?Has Dressing in Place as Prescribed: Yes ?Arrival Time: 11:16 ?Pain Present Now: Yes ?Accompanied By: self ?Transfer Assistance: None ?Patient Identification Verified: Yes ?Secondary Verification Process Completed: Yes ?Patient Requires Transmission-Based Precautions: No ?Patient Has Alerts: No ?Electronic Signature(s) ?Signed: 04/30/2022 4:13:01 PM By: Elliot Gurney, BSN, RN, CWS, Kim RN, BSN ?Entered By: Elliot Gurney, BSN, RN, CWS, Kim on 04/30/2022 11:16:33 ?Ricchio, Kareena C. ([id number]) ?-------------------------------------------------------------------------------- ?Clinic Level of Care Assessment Details ?Patient Name: [name], SCULLEY ?Date of Service: 04/30/2022 11:00 AM ?Medical Record Number: [id number] ?Patient Account Number: [id number] ?Date of Birth/Sex: 09-12-1961 (61 y.o. F) ?Treating RN: Huel Coventry ?Primary Care Aaliyana Fredericks: CardJonny Ruiz Other Clinician: ?Referring Avie Checo: Card, John ?Treating Maryrose Colvin/Extender: Allen Derry ?Weeks in Treatment: 28 ?Clinic Level of Care Assessment Items ?TOOL 4 Quantity Score ?[]  - Use when only an EandM is performed on FOLLOW-UP visit 0 ?ASSESSMENTS - Nursing Assessment / Reassessment ?X - Reassessment of Co-morbidities (includes updates in patient status) 1 10 ?X- 1 5 ?Reassessment of Adherence to Treatment Plan ?ASSESSMENTS - Wound and Skin Assessment / Reassessment ?X - Simple Wound Assessment / Reassessment - one wound 1 5 ?[]  - 0 ?Complex  Wound Assessment / Reassessment - multiple wounds ?[]  - 0 ?Dermatologic / Skin Assessment (not related to wound area) ?ASSESSMENTS - Focused Assessment ?[]  - Circumferential Edema Measurements - multi extremities 0 ?[]  - 0 ?Nutritional Assessment / Counseling / Intervention ?[]  - 0 ?Lower Extremity Assessment (monofilament, tuning fork, pulses) ?[]  - 0 ?Peripheral Arterial Disease Assessment (using hand held doppler) ?ASSESSMENTS - Ostomy and/or Continence Assessment and Care ?[]  - Incontinence Assessment and Management 0 ?[]  - 0 ?Ostomy Care Assessment and Management (repouching, etc.) ?PROCESS - Coordination of Care ?X - Simple Patient / Family Education for ongoing care 1 15 ?[]  - 0 ?Complex (extensive) Patient / Family Education for ongoing care ?X- 1 10 ?Staff obtains Consents, Records, Test Results / Process Orders ?[]  - 0 ?Staff telephones HHA, Nursing Homes / Clarify orders / etc ?[]  - 0 ?Routine Transfer to another Facility (non-emergent condition) ?[]  - 0 ?Routine Hospital Admission (non-emergent condition) ?[]  - 0 ?New Admissions / / Ordering NPWT, Apligraf, etc. ?[]  - 0 ?Emergency Hospital Admission (emergent condition) ?X- 1 10 ?Simple Discharge Coordination ?[]  - 0 ?Complex (extensive) Discharge Coordination ?PROCESS - Special Needs ?[]  - Pediatric / Minor Patient Management 0 ?[]  - 0 ?Isolation Patient Management ?[]  - 0 ?Hearing / Language / Visual special needs ?[]  - 0 ?Assessment of Community assistance (transportation, D/C planning, etc.) ?[]  - 0 ?Additional assistance / Altered mentation ?[]  - 0 ?Support Surface(s) Assessment (bed, cushion, seat, etc.) ?INTERVENTIONS - Wound Cleansing / Measurement ?Lauver, Ashleah C. ( ) ?X- 1 5 ?Simple Wound Cleansing - one wound ?[]  - 0 ?Complex Wound Cleansing - multiple wounds ?X- 1 5 ?Wound Imaging (photographs - any number of wounds) ?[]  - 0 ?Wound Tracing (instead of photographs) ?X- 1 5 ?Simple Wound Measurement - one  wound ?[]  - 0 ?Complex Wound Measurement - multiple wounds ?INTERVENTIONS - Wound Dressings ?[]  - Small Wound Dressing one or multiple  wounds 0 ?X- 1 15 ?Medium Wound Dressing one or multiple wounds ?[]  - 0 ?Large Wound Dressing one or multiple wounds ?[]  - 0 ?Application of Medications - topical ?[]  - 0 ?Application of Medications - injection ?INTERVENTIONS - Miscellaneous ?[]  - External ear exam 0 ?[]  - 0 ?Specimen Collection (cultures, biopsies, blood, body fluids, etc.) ?[]  - 0 ?Specimen(s) / Culture(s) sent or taken to Lab for analysis ?[]  - 0 ?Patient Transfer (multiple staff / / Similar devices) ?[]  - 0 ?Simple Staple / Suture removal (25 or less) ?[]  - 0 ?Complex Staple / Suture removal (26 or more) ?[]  - 0 ?Hypo / Hyperglycemic Management (close monitor of Blood Glucose) ?[]  - 0 ?Ankle / Brachial Index (ABI) - do not check if billed separately ?X- 1 5 ?Vital Signs ?Has the patient been seen at the hospital within the last three years: Yes ?Total Score: 90 ?Level Of Care: New/Established - Level ?3 ?Electronic Signature(s) ?Signed: 04/30/2022 4:13:01 PM By: , BSN, RN, CWS, Kim RN, BSN ?Entered By: , BSN, RN, CWS, Kim on 04/30/2022 11:38:02 ?Haueter, Shelie C. ( ) ?-------------------------------------------------------------------------------- ?Encounter Discharge Information Details ?Patient Name: [name], [name] ?Date of Service: 04/30/2022 11:00 AM ?Medical Record Number: ?Patient Account Number: ?Date of Birth/Sex: 07/15/1961 (61 y.o. F) ?Treating RN: 06/30/2022 ?Primary Care Hamish Banks: CardElliot Gurney Other Clinician: ?Referring Roselle Norton: Card, John ?Treating Cheyenne Schumm/Extender: Elliot Gurney ?Weeks in Treatment: 28 ?Encounter Discharge Information Items ?Discharge Condition: Stable ?Ambulatory Status: 06/30/2022 ?Discharge Destination: Home ?Transportation: Private Auto ?Accompanied By: self ?Schedule Follow-up Appointment: Yes ?Clinical Summary of Care: ?Electronic  Signature(s) ?Signed: 04/30/2022 4:13:01 PM By: Lowella Grip, BSN, RN, CWS, Kim RN, BSN ?Entered By: 06/30/2022, BSN, RN, CWS, Kim on 04/30/2022 11:40:42 ?Jaeger, Chemika C. ([id number]) ?-------------------------------------------------------------------------------- ?Lower Extremity Assessment Details ?Patient Name: [name], KINCANNON ?Date of Service: 04/30/2022 11:00 AM ?Medical Record Number: Huel Coventry ?Patient Account Number: Jonny Ruiz ?Date of Birth/Sex: 11-Mar-1961 (61 y.o. F) ?Treating RN: 06/30/2022 ?Primary Care Ayman Brull: CardElliot Gurney Other Clinician: ?Referring Wylie Coon: Card, John ?Treating Olympia Adelsberger/Extender: Elliot Gurney ?Weeks in Treatment: 28 ?Electronic Signature(s) ?Signed: 04/30/2022 4:13:01 PM By: [id number], BSN, RN, CWS, Kim RN, BSN ?Entered By: Lowella Grip, BSN, RN, CWS, Kim on 04/30/2022 11:22:48 ?Deshaies, Santrice C. ([id number]) ?-------------------------------------------------------------------------------- ?Multi Wound Chart Details ?Patient Name: [name], MATSUSHITA ?Date of Service: 04/30/2022 11:00 AM ?Medical Record Number: ([id number] ?Patient Account Number: Huel Coventry ?Date of Birth/Sex: 07/24/1961 (61 y.o. F) ?Treating RN: 06/30/2022 ?Primary Care Tymon Nemetz: CardElliot Gurney Other Clinician: ?Referring Tilak Oakley: Card, John ?Treating Volanda Mangine/Extender: Elliot Gurney ?Weeks in Treatment: 28 ?Vital Signs ?Height(in): ?Pulse(bpm): 80 ?Weight(lbs): 275 ?Blood Pressure(mmHg): 143/85 ?Body Mass Index(BMI): ?Temperature(??F): 97.7 ?Respiratory Rate(breaths/min): 18 ?Photos: [N/A:N/A] ?Wound Location: Distal, Midline Back N/A N/A ?Wounding Event: Surgical Injury N/A N/A ?Primary Etiology: Dehisced Wound N/A N/A ?Comorbid History: Hypertension N/A N/A ?Date Acquired: 04/11/2021 N/A N/A ?Weeks of Treatment: 28 N/A N/A ?Wound Status: Open N/A N/A ?Wound Recurrence: No N/A N/A ?Measurements L x W x D (cm) 0.4x0.3x3.5 N/A N/A ?Area (cm?) : 0.094 N/A N/A ?Volume (cm?) : 0.33 N/A N/A ?% Reduction in Area: 25.40% N/A N/A ?% Reduction in Volume:  42.90% N/A N/A ?Position 1 (o'clock): ?Tunneling: Yes N/A N/A ?Classification: Full Thickness Without Exposed N/A N/A ?Support Structures ?Exudate Amount: Medium N/A N/A ?Exudate Type: Serous N/A N/A ?E

## 2022-04-30 NOTE — Progress Notes (Addendum)
[name], [name] ([id number]) ?Visit Report for 04/30/2022 ?Chief Complaint Document Details ?Patient Name: [name], [name] ?Date of Service: 04/30/2022 11:00 AM ?Medical Record Number: [id number] ?Patient Account Number: [id number] ?Date of Birth/Sex: 11/17/1961 (61 y.o. F) ?Treating RN: Huel Coventry ?Primary Care Provider: CardJonny Ruiz Other Clinician: ?Referring Provider: Card, John ?Treating Provider/Extender: Allen Derry ?Weeks in Treatment: 28 ?Information Obtained from: Patient ?Chief Complaint ?Surgical Back Ulcer ?Electronic Signature(s) ?Signed: 04/30/2022 11:03:39 AM By: [name] PA-C ?Entered By: [name] on 04/30/2022 11:03:39 ?[name], [name] ([id number]) ?-------------------------------------------------------------------------------- ?HPI Details ?Patient Name: [name], [name] ?Date of Service: 04/30/2022 11:00 AM ?Medical Record Number: [id number] ?Patient Account Number: [id number] ?Date of Birth/Sex: 03-13-1961 (61 y.o. F) ?Treating RN: Huel Coventry ?Primary Care Provider: CardJonny Ruiz Other Clinician: ?Referring Provider: Card, John ?Treating Provider/Extender: Allen Derry ?Weeks in Treatment: 28 ?History of Present Illness ?HPI Description: 10/15/2021 upon evaluation today patient presents for initial evaluation here in the clinic concerning a surgical ?ulceration/dehiscence in the lumbar spine region following surgery that she had over the past year. This was actually broken up into 3 separate ?surgical events. The initial surgical intervention actually was on November 05, 2021 almost a year ago. Subsequently the patient went back in ?February for a seroma of the area which unfortunately required her to have a repeat surgery to go in and clean this out. And then again this ?occurred in April where she went back in and again they felt like stitches were coming out and there was an additional seroma. She was placed in ?a wound VAC initially and then subsequently as it got smaller that was  discontinued. Again right now I will see anything that I think a wound VAC ?would help with. Nonetheless she definitely has a significant depth to the wound that is going require packing. I actually believe the Hydrofera ?Blue rope would probably do quite well with this the problem is as much as it is draining she probably needs this to be changed at least every day. ?She does not really have anyone that can help with that that is the complicating scenario here. With that being said the patient does have a ?history of multiple sclerosis, hypertension, and again this surgical wound dehiscence in regard to her lumbar spine region. She did have a repeat ?MRI which was actually completed 10/09/2021. This showed that there was no significant change in the subcutaneous fluid collection/track of the ?lower lumbar region. This is extending to the level of the fascia unfortunately. This seems to go all the way from the L2 level with a track ?extending all the way to the fascia at the L4-5 level. Again this is a significant wound and there is significant drainage but does not seem to ?communicate to the spinal region as far as spinal fluid or otherwise is concerned that is good news. Nonetheless she last saw Dr. Franky Macho who is ?her neurosurgeon on 10/01/2021 that was when he ordered this last MRI she supposed to see him next week as well. With that being said he did ?not feel like there was any significant issue there but was not sure why this was not healing that is when he ordered the MRI. They were wanting ?to make sure that this was packed appropriately by home health unfortunately the main issue currently is that home health is completely out of ?the picture as the patient has exhausted all the home health that that she gets for a year. She is now in a very difficult  predicament where she ?does not have anyone to help her change the dressing and to be honest that she is not able to do it herself with the location of the  wound being ?on the midline lumbar spine region. If she does not have anyone that can help it is probably can to be necessary for her to go to a facility for ?rehab and daily dressing changes as I feel like daily changes which is much drainage that she is having is going to be necessary. ?10/23/2021 upon evaluation today patient appears to be doing decently well in regard to her back ulcer. This does seem to be draining a lot less ?than what it is been doing in the past. With that being said she still has quite a bit of drainage nonetheless. I do think that given time this should ?improve least I hope so. The good news is she does have home health coming out 3 days a week were doing it 2 days a week and she is paying ?someone we can to help. ?10/30/2021 upon evaluation today patient appears to be doing okay in regard to her back ulcer this is not draining quite as bad as it was in the ?beginning but he still has quite a bit of drainage noted. I do believe that the patient would benefit from [place] going ahead forward with attempting a ?wound VAC using the Hydrofera Blue rope to pack with and then subsequently using the VAC externally to actually suction out and help this to fill- ?in. I think this is our ideal way to try to get things cleared at this point. As it stands I am not certain that we are really making a progress that we ?want to see near with doing it in the way we are which is packing with the rope. It is a good dressing but I do think it is insufficient for total ?healing. She just seems to have too much in the way of drainage at this point unfortunately. ?11/06/2021 upon evaluation today patient unfortunately continues to have issues with her back ongoing. The good news is her MRI that was ?repeated showed signs of the size of this area in the lumbar spine region having decreased from 4 cm to 3.5 cm this is definitely not bad news at ?all. With that being said unfortunately she continues to have issues with  ongoing drainage not as severe as in the beginning but nonetheless still ?significant. I do think a wound VAC still would be a good way to go although her home health agency nurse apparently has some concerns about ?the possibility of not being able to keep a seal with this as they had struggles in the past. Nonetheless I explained to the patient that this is much ?different than what she had previous and that I really feel like it would do much better as far as getting the area taken care of without having any ?complications or issues here. I think that we should be able to maintain a seal. Nonetheless at this time I did discuss with the patient as well that ?she probably does need to have a wound VAC in order for [place] to get this moving in the right direction. ?11/13/2021 upon evaluation patient's wound bed actually showed signs of significant drainage at this time. She did see the surgeon yesterday he ?did not see anything that appeared to be infected. Nonetheless he does appear that she is continuing to have areas here that just do not seem  to ?want to seal up there MRI findings have been negative but nonetheless she continues to have is the seroma that is filling in. I do feel like we need ?to try to widen the hole so we can get at least a half of the [facility] then this will be better than nothing at this point. ?11/20/2021 upon evaluation today patient actually appears to be having less pain at this point which is good news and overall she we still do not ?have the results of the culture back yet it had to be sent out to [facility] and we do not have the result back yet. Is doing decently well in regard to ?her wound. Fortunately there does not appear to be any signs of active infection systemically nor locally at this time. ?11/27/2021 upon evaluation today patient appears to be doing well with regard to her wound all things considered there does appear to be less ?drainage than there was previous.  Fortunately I do not see any evidence of worsening of the patient is stating that she is having some issues with ?back pain. This is somewhat new. Again this I think could be related to the fact that she is having

## 2022-05-01 ENCOUNTER — Encounter: Payer: 59 | Admitting: Occupational Therapy

## 2022-05-01 NOTE — Therapy (Signed)
[facility] ?[place], [name], [address] ?Phone: [phone]   Fax:  [phone] ? ?Occupational Therapy Treatment ? ?Patient Details  ?Name: [name] ?MRN: [id number] ?Date of Birth: Sep 26, 1961 ?Referring Provider (OT): Crystal Arringtonm FNP ? ? ?Encounter Date: 04/30/2022 ? ? OT End of Session - 04/30/22 1250   ? ? Visit Number 14   ? Number of Visits 36   ? Date for OT Re-Evaluation 05/15/22   ? OT Start Time 0100   ? OT Stop Time 0200   ? OT Time Calculation (min) 60 min   ? Activity Tolerance Patient tolerated treatment well;No increased pain   ? Behavior During Therapy [facility] for tasks assessed/performed   ? ?  ?  ? ?  ? ? ?Past Medical History:  ?Diagnosis Date  ? Arthritis   ? Back pain   ? Chronic knee pain   ? Edema of both lower extremities   ? High blood pressure   ? High cholesterol   ? Joint pain   ? Multiple sclerosis ([place])   ? Neuromuscular disorder ([place])   ? Multiple Sclerosis over 20 years  ? Obesity   ? Vitamin D deficiency   ? ? ?Past Surgical History:  ?Procedure Laterality Date  ? APPENDECTOMY    ? CESAREAN SECTION    ? HAND SURGERY    ? lumbar back surgery    ? LUMBAR WOUND DEBRIDEMENT N/A 12/05/2020  ? Procedure: Lumbar Wound Exploration;  Surgeon: Ashok Pall, MD;  Location: [place];  Service: Neurosurgery;  Laterality: N/A;  3C/RM 21  ? LUMBAR WOUND DEBRIDEMENT N/A 02/21/2021  ? Procedure: Lumbar wound exploration;  Surgeon: Ashok Pall, MD;  Location: [place];  Service: Neurosurgery;  Laterality: N/A;  posterior  ? LUMBAR WOUND DEBRIDEMENT N/A 04/11/2021  ? Procedure: Wound Exploration with Wound Vac Placement;  Surgeon: Ashok Pall, MD;  Location: [place];  Service: Neurosurgery;  Laterality: N/A;  ? ROTATOR CUFF REPAIR    ? TONSILLECTOMY    ? ? ?There were no vitals filed for this visit. ? ? Subjective Assessment - 04/30/22 1251   ? ? Subjective  [name] returns to OT for treatment visit 14/36 to address  BLE lymphedema. Pt denies LE related pain in her legs.   ? Pertinent History B knee OA, HTN, Obesity, MS, Spondylolisthesis-lumbar, open wound at lumbar surgical site since 4/22.   ? Limitations difficulty walking, impaired transfers, chronic OA pain in bilateral knees, non healing post surgical lumbar wound, BLE muscle weakness 2/2 MS, chronic leg swelling with minimal pain,   ? Special Tests - Stemmer bilaterally; Intake FOTO score =31/100   ? Patient Stated Goals get my legs better so I can do more   ? Currently in Pain? Yes   ? Pain Onset Other (comment)   6 mnths ago without precipitating event. No family hx  ? Pain Onset Other (comment)   years  ? ?  ?  ? ?  ? ? ? ? ? ? ? ? ? ? ? ? ? ? ? OT Treatments/Exercises (OP) - 05/01/22 0820   ? ?  ? ADLs  ? ADL Education Given Yes   ?  ? Manual Therapy  ? Manual Therapy Edema management   ? Manual Lymphatic Drainage (MLD) MLD to RLE/RLQ utilizing diaphragmatic breathing to access deep abdominal pathways, short neck sequence and inguinal LN, then proovided MLD from proximal to distal addressing thigh, leg  and foot. Foinished up session with short neck x 3 and deep breathing.   ? Compression Bandaging RLE multilayer compression wrap from base of toes to popliteal fossa using 1 ea 8, 10 and 12 cm wide short stretch bandage over cotton stockinette and 0.4 cm thick Rosidal foam.   ? ?  ?  ? ?  ? ? ? ? ? ? ? ? ? OT Education - 04/30/22 1253   ? ? Education Details Continued skilled Pt/caregiver education  And LE ADL training throughout visit for lymphedema self care/ home program, including compression wrapping, compression garment and device wear/care, lymphatic pumping ther ex, simple self-MLD, and skin care. Discussed initial volumetric measurements. Discussed all long term goals.   ? Person(s) Educated Patient   ? Methods Explanation;Demonstration;Handout   ? Comprehension Verbalized understanding;Returned demonstration;Need further instruction   ? ?  ?  ? ?  ? ? ? ? ? ?  OT Long Term Goals - 04/16/22 1359   ? ?  ? OT LONG TERM GOAL #1  ? Title Given this patient?s Intake score 31 /100 on the functional outcomes FOTO tool, patient will experience an increase in function of 5 points to improve basic and instrumental ADLs performance, including lymphedema self-care.   ? Baseline 31/100   ? Period Weeks   ? Status Partially Met   TBA next visit  ? Target Date 05/16/22   ?  ? OT LONG TERM GOAL #2  ? Title Pt will demonstrate understanding of lymphedema prevention strategies by identifying and discussing 5 precautions using printed reference (modified assistance) to reduce risk of progression and to limit infection risk.   ? Baseline Max A   ? Time 4   ? Period Days   ? Status Achieved   ? Target Date --   4th OT Rx visit  ?  ? OT LONG TERM GOAL #3  ? Title With Maximum caregiver assistance Pt will be able to apply multilayer, LUE compression wraps using gradient techniques to decrease limb volume, to limit infection risk, and to limit lymphedema progression. Pt unable to wrap legs independently. Trained caregiver must assist during visit intervals for CDT to be effective.   ? Baseline dependent - Max caregiver assistance is necessary to complete Intensive Phase of Complete Decongestive Therapy and Lymphedema self-care home program  as Pt is unable to reach his feet to apply compression wraps and garments, to inspect skin, to groom nails to perform skin care and inspection and to perform simple self-MLD. Pt agrees to arrange for consistent caregiver prior to commencing CDT.   ? Time 4   ? Period Days   ? Status Achieved   ? Target Date --   4th OT Rx visit  ?  ? OT LONG TERM GOAL #4  ? Title With Maximum caregiver assistance between OT sessions Pt will achieve at least a 10% limb volume reduction below the knee bilaterally to return limb to more normal size and shape, to limit infection risk, to decrease pain, to improve function, and to limit lymphedema progression.   Pt has achieved 8%  voume reduction to date , which is excellent progress towards goals.  ? Baseline dependent   ? Time 12   ? Period Weeks   ? Status Partially Met   achieved for RLE below the knee  ? Target Date 05/16/22   ?  ? OT LONG TERM GOAL #5  ? Title With caregiver assistance  Pt will achieve and sustain a  least 85% compliance with all 4 LE self-care home program components throughout Intensive Phase CDT, including modified simple self-MLD, daily skin inspection and care, lymphatic pumping the ex, 23/7 compression wraps to optimize limb volume reductions, to limit lymphedema progression and to limit further functional decline.   ? Baseline Dependent   ? Time 12   ? Period Weeks   ? Status Partially Met   ? Target Date 05/16/22   ? ?  ?  ? ?  ? ? ? ? ? ? ? ? Plan - 05/01/22 0821   ? ? Clinical Impression Statement Pt tolerated RLE/RLQ MLD and simultaneous skin care with low ph castor oil matching skin ph to limit infection risk and hydrate skin.  Reapplied compression wraps as established. Limb volume and skin condition continue to improve each visit. Pt remains compliant with aspects of home program she has learned thus far. Fit R custom Elvarex compression stocking as soon as it's available from DME vendor. Cont as per POC.   ? OT Occupational Profile and History Detailed Assessment- Review of Records and additional review of physical, cognitive, psychosocial history related to current functional performance   ? Occupational performance deficits (Please refer to evaluation for details): ADL's;IADL's;Rest and Sleep;Leisure;Social Participation;Work   ? Rehab Potential Fair   ? Clinical Decision Making Several treatment options, min-mod task modification necessary   ? Comorbidities Affecting Occupational Performance: May have comorbidities impacting occupational performance   ? Modification or Assistance to Complete Evaluation  Min-Moderate modification of tasks or assist with assess necessary to complete eval   ? OT Frequency  2x / week   ? OT Duration 12 weeks   ? OT Treatment/Interventions Self-care/ADL training;DME and/or AE instruction;Manual lymph drainage;Compression bandaging;Therapeutic activities;Coping strategies t

## 2022-05-01 NOTE — Patient Instructions (Signed)

## 2022-05-02 DIAGNOSIS — T8131XA Disruption of external operation (surgical) wound, not elsewhere classified, initial encounter: Secondary | ICD-10-CM | POA: Diagnosis not present

## 2022-05-02 NOTE — Progress Notes (Signed)
Petterson, Labresha C. ([id number]) ?Visit Report for 05/02/2022 ?Arrival Information Details ?Patient Name: [name], [name] ?Date of Service: 05/02/2022 3:00 PM ?Medical Record Number: [id number] ?Patient Account Number: [id number] ?Date of Birth/Sex: 1961/07/18 (61 y.o. F) ?Treating RN: Angelina Pih ?Primary Care Lanae Federer: CardJonny Ruiz Other Clinician: ?Referring Marcel Gary: Card, John ?Treating Marion Seese/Extender: Allen Derry ?Weeks in Treatment: 28 ?Visit Information History Since Last Visit ?Added or deleted any medications: No ?Patient Arrived: [name] ?Any new allergies or adverse reactions: No ?Arrival Time: 14:41 ?Had a fall or experienced change in No ?Accompanied By: self ?activities of daily living that may affect ?Transfer Assistance: None ?risk of falls: ?Patient Requires Transmission-Based Precautions: No ?Hospitalized since last visit: No ?Patient Has Alerts: No ?Has Dressing in Place as Prescribed: Yes ?Pain Present Now: No ?Electronic Signature(s) ?Signed: 05/02/2022 2:56:57 PM By: Angelina Pih ?Entered By: Angelina Pih on 05/02/2022 14:41:31 ?Brendlinger, Sasha C. ([id number]) ?-------------------------------------------------------------------------------- ?Clinic Level of Care Assessment Details ?Patient Name: [name], [name] ?Date of Service: 05/02/2022 3:00 PM ?Medical Record Number: [id number] ?Patient Account Number: [id number] ?Date of Birth/Sex: 15-Jan-1961 (61 y.o. F) ?Treating RN: Angelina Pih ?Primary Care Lossie Kalp: CardJonny Ruiz Other Clinician: ?Referring Jakayla Schweppe: Card, John ?Treating Bebe Moncure/Extender: Allen Derry ?Weeks in Treatment: 28 ?Clinic Level of Care Assessment Items ?TOOL 4 Quantity Score ?[]  - Use when only an EandM is performed on FOLLOW-UP visit 0 ?ASSESSMENTS - Nursing Assessment / Reassessment ?X - Reassessment of Co-morbidities (includes updates in patient status) 1 10 ?X- 1 5 ?Reassessment of Adherence to Treatment Plan ?ASSESSMENTS - Wound and Skin Assessment /  Reassessment ?X - Simple Wound Assessment / Reassessment - one wound 1 5 ?[]  - 0 ?Complex Wound Assessment / Reassessment - multiple wounds ?[]  - 0 ?Dermatologic / Skin Assessment (not related to wound area) ?ASSESSMENTS - Focused Assessment ?[]  - Circumferential Edema Measurements - multi extremities 0 ?[]  - 0 ?Nutritional Assessment / Counseling / Intervention ?[]  - 0 ?Lower Extremity Assessment (monofilament, tuning fork, pulses) ?[]  - 0 ?Peripheral Arterial Disease Assessment (using hand held doppler) ?ASSESSMENTS - Ostomy and/or Continence Assessment and Care ?[]  - Incontinence Assessment and Management 0 ?[]  - 0 ?Ostomy Care Assessment and Management (repouching, etc.) ?PROCESS - Coordination of Care ?X - Simple Patient / Family Education for ongoing care 1 15 ?[]  - 0 ?Complex (extensive) Patient / Family Education for ongoing care ?[]  - 0 ?Staff obtains Consents, Records, Test Results / Process Orders ?[]  - 0 ?Staff telephones HHA, Nursing Homes / Clarify orders / etc ?[]  - 0 ?Routine Transfer to another Facility (non-emergent condition) ?[]  - 0 ?Routine Hospital Admission (non-emergent condition) ?[]  - 0 ?New Admissions / / Ordering NPWT, Apligraf, etc. ?[]  - 0 ?Emergency Hospital Admission (emergent condition) ?X- 1 10 ?Simple Discharge Coordination ?[]  - 0 ?Complex (extensive) Discharge Coordination ?PROCESS - Special Needs ?[]  - Pediatric / Minor Patient Management 0 ?[]  - 0 ?Isolation Patient Management ?[]  - 0 ?Hearing / Language / Visual special needs ?[]  - 0 ?Assessment of Community assistance (transportation, D/C planning, etc.) ?[]  - 0 ?Additional assistance / Altered mentation ?[]  - 0 ?Support Surface(s) Assessment (bed, cushion, seat, etc.) ?INTERVENTIONS - Wound Cleansing / Measurement ?Kleen, Shayonna C. ( ) ?X- 1 5 ?Simple Wound Cleansing - one wound ?[]  - 0 ?Complex Wound Cleansing - multiple wounds ?[]  - 0 ?Wound Imaging (photographs - any number of  wounds) ?[]  - 0 ?Wound Tracing (instead of photographs) ?[]  - 0 ?Simple Wound Measurement - one wound ?[]  - 0 ?Complex Wound Measurement - multiple  wounds ?INTERVENTIONS - Wound Dressings ?X - Small Wound Dressing one or multiple wounds 1 10 ?[]  - 0 ?Medium Wound Dressing one or multiple wounds ?[]  - 0 ?Large Wound Dressing one or multiple wounds ?[]  - 0 ?Application of Medications - topical ?[]  - 0 ?Application of Medications - injection ?INTERVENTIONS - Miscellaneous ?[]  - External ear exam 0 ?[]  - 0 ?Specimen Collection (cultures, biopsies, blood, body fluids, etc.) ?[]  - 0 ?Specimen(s) / Culture(s) sent or taken to Lab for analysis ?[]  - 0 ?Patient Transfer (multiple staff / / Similar devices) ?[]  - 0 ?Simple Staple / Suture removal (25 or less) ?[]  - 0 ?Complex Staple / Suture removal (26 or more) ?[]  - 0 ?Hypo / Hyperglycemic Management (close monitor of Blood Glucose) ?[]  - 0 ?Ankle / Brachial Index (ABI) - do not check if billed separately ?[]  - 0 ?Vital Signs ?Has the patient been seen at the hospital within the last three years: Yes ?Total Score: 60 ?Level Of Care: New/Established - ?Level 2 ?Electronic Signature(s) ?Signed: 05/02/2022 2:56:57 PM By: ?Entered By: on 05/02/2022 14:51:12 ?Savino, Cantrell C. ( ) ?-------------------------------------------------------------------------------- ?Encounter Discharge Information Details ?Patient Name: [name], [name] ?Date of Service: 05/02/2022 3:00 PM ?Medical Record Number: Nurse, adult ?Patient Account Number: ?Date of Birth/Sex: [birth year] (61 y.o. F) ?Treating RN: ?Primary Care Allisyn Kunz: Card Other Clinician: ?Referring Nusrat Encarnacion: Card, John ?Treating Marley Pakula/Extender: 07/02/2022 ?Weeks in Treatment: 28 ?Encounter Discharge Information Items ?Discharge Condition: Stable ?Ambulatory Status: Angelina Pih ?Discharge Destination: Home ?Transportation: Private Auto ?Accompanied By:  self ?Schedule Follow-up Appointment: Yes ?Clinical Summary of Care: Patient Declined ?Electronic Signature(s) ?Signed: 05/02/2022 2:56:57 PM By: 07/02/2022 ?Entered By: [id number] on 05/02/2022 14:50:13 ?Larivee, Sherolyn C. (07/02/2022) ?-------------------------------------------------------------------------------- ?Wound Assessment Details ?Patient Name: [name], [name] ?Date of Service: 05/02/2022 3:00 PM ?Medical Record Number: 09/11/1961 ?Patient Account Number: ([id number] ?Date of Birth/Sex: [birth year] (61 y.o. F) ?Treating RN: Allen Derry ?Primary Care Amayrany Cafaro: CardDan [name] Other Clinician: ?Referring Kamori Kitchens: Card, John ?Treating Loreena Valeri/Extender: 07/02/2022 ?Weeks in Treatment: 28 ?Wound Status ?Wound Number: 1 ?Primary Etiology: Dehisced Wound ?Wound Location: Distal, Midline Back ?Wound Status: Open ?Wounding Event: Surgical Injury ?Comorbid History: Hypertension ?Date Acquired: 04/11/2021 ?Weeks Of Treatment: 28 ?Clustered Wound: No ?Wound Measurements ?Length: (cm) 0.4 ?Width: (cm) 0.3 ?Depth: (cm) 3.5 ?Area: (cm?) 0.094 ?Volume: (cm?) 0.33 ?% Reduction in Area: 25.4% ?% Reduction in Volume: 42.9% ?Epithelialization: None ?Tunneling: Yes ?Undermining: No ?Wound Description ?Classification: Full Thickness Without Exposed Support Structu ?Exudate Amount: Medium ?Exudate Type: Serous ?Exudate Color: amber ?res Foul Odor After Cleansing: No ?Slough/Fibrino No ?Wound Bed ?Granulation Amount: Large (67-100%) Exposed Structure ?Granulation Quality: Red ?Fascia Exposed: No ?Necrotic Amount: None Present (0%) ?Fat Layer (Subcutaneous Tissue) Exposed: Yes ?Tendon Exposed: No ?Muscle Exposed: No ?Joint Exposed: No ?Bone Exposed: No ?Treatment Notes ?Wound #1 (Back) Wound Laterality: Midline, Distal ?Cleanser ?Normal Saline ?Discharge Instruction: Wash your hands with soap and water. Remove old dressing, discard into plastic bag and place into trash. ?Cleanse the wound with Normal Saline prior to  applying a clean dressing using gauze sponges, not tissues or cotton balls. Do not ?scrub or use excessive force. Pat dry using gauze sponges, not tissue or cotton balls. ?Peri-Wound Care ?Topical ?Primary Dressing ?Hydrofera Blue Rop

## 2022-05-03 ENCOUNTER — Ambulatory Visit: Payer: 59 | Admitting: Occupational Therapy

## 2022-05-03 DIAGNOSIS — I890 Lymphedema, not elsewhere classified: Secondary | ICD-10-CM

## 2022-05-03 NOTE — Patient Instructions (Signed)

## 2022-05-03 NOTE — Therapy (Signed)
[facility] ?[place], [name], [address] ?Phone: [phone]   Fax:  [phone] ? ?Occupational Therapy Treatment ? ?Patient Details  ?Name: [name] ?MRN: [id number] ?Date of Birth: October 11, 1961 ?Referring Provider (OT): Crystal Arringtonm FNP ? ? ?Encounter Date: 05/03/2022 ? ? OT End of Session - 05/03/22 1112   ? ? Visit Number 15   ? Number of Visits 36   ? Date for OT Re-Evaluation 05/15/22   ? OT Start Time 1105   ? Activity Tolerance Patient tolerated treatment well;No increased pain   ? Behavior During Therapy [facility] for tasks assessed/performed   ? ?  ?  ? ?  ? ? ?Past Medical History:  ?Diagnosis Date  ? Arthritis   ? Back pain   ? Chronic knee pain   ? Edema of both lower extremities   ? High blood pressure   ? High cholesterol   ? Joint pain   ? Multiple sclerosis ([place])   ? Neuromuscular disorder ([place])   ? Multiple Sclerosis over 20 years  ? Obesity   ? Vitamin D deficiency   ? ? ?Past Surgical History:  ?Procedure Laterality Date  ? APPENDECTOMY    ? CESAREAN SECTION    ? HAND SURGERY    ? lumbar back surgery    ? LUMBAR WOUND DEBRIDEMENT N/A 12/05/2020  ? Procedure: Lumbar Wound Exploration;  Surgeon: Ashok Pall, MD;  Location: [facility];  Service: Neurosurgery;  Laterality: N/A;  3C/RM 21  ? LUMBAR WOUND DEBRIDEMENT N/A 02/21/2021  ? Procedure: Lumbar wound exploration;  Surgeon: Ashok Pall, MD;  Location: [facility];  Service: Neurosurgery;  Laterality: N/A;  posterior  ? LUMBAR WOUND DEBRIDEMENT N/A 04/11/2021  ? Procedure: Wound Exploration with Wound Vac Placement;  Surgeon: Ashok Pall, MD;  Location: [facility];  Service: Neurosurgery;  Laterality: N/A;  ? ROTATOR CUFF REPAIR    ? TONSILLECTOMY    ? ? ?There were no vitals filed for this visit. ? ? Subjective Assessment - 05/03/22 1114   ? ? Subjective  [name] returns to OT for treatment visit 15/36 to address BLE lymphedema. Pt denies LE related pain in her legs.Pt is  requesting update on compression garment.   ? Pertinent History B knee OA, HTN, Obesity, MS, Spondylolisthesis-lumbar, open wound at lumbar surgical site since 4/22.   ? Limitations difficulty walking, impaired transfers, chronic OA pain in bilateral knees, non healing post surgical lumbar wound, BLE muscle weakness 2/2 MS, chronic leg swelling with minimal pain,   ? Special Tests - Stemmer bilaterally; Intake FOTO score =31/100   ? Patient Stated Goals get my legs better so I can do more   ? Pain Onset Other (comment)   6 mnths ago without precipitating event. No family hx  ? Pain Onset Other (comment)   years  ? ?  ?  ? ?  ? ? ? ? ? ? ? ? ? ? ? ? ? ? ? OT Treatments/Exercises (OP) - 05/03/22 1116   ? ?  ? Manual Therapy  ? Compression Bandaging RLE multilayer compression wrap from base of toes to popliteal fossa using 1 ea 8, 10 and 12 cm wide short stretch bandage over cotton stockinette and 0.4 cm thick Rosidal foam.   ? ?  ?  ? ?  ? ? ? ? ? ? ? ? ? OT Education - 05/03/22 1117   ? ? Education Details Continued skilled Pt/caregiver  education  And LE ADL training throughout visit for lymphedema self care/ home program, including compression wrapping, compression garment and device wear/care, lymphatic pumping ther ex, simple self-MLD, and skin care. Discussed initial volumetric measurements. Discussed all long term goals.   ? Person(s) Educated Patient   ? Methods Explanation;Demonstration;Handout   ? Comprehension Verbalized understanding;Returned demonstration;Need further instruction   ? ?  ?  ? ?  ? ? ? ? ? ? OT Long Term Goals - 04/16/22 1359   ? ?  ? OT LONG TERM GOAL #1  ? Title Given this patient?s Intake score 31 /100 on the functional outcomes FOTO tool, patient will experience an increase in function of 5 points to improve basic and instrumental ADLs performance, including lymphedema self-care.   ? Baseline 31/100   ? Period Weeks   ? Status Partially Met   TBA next visit  ? Target Date 05/16/22   ?  ?  OT LONG TERM GOAL #2  ? Title Pt will demonstrate understanding of lymphedema prevention strategies by identifying and discussing 5 precautions using printed reference (modified assistance) to reduce risk of progression and to limit infection risk.   ? Baseline Max A   ? Time 4   ? Period Days   ? Status Achieved   ? Target Date --   4th OT Rx visit  ?  ? OT LONG TERM GOAL #3  ? Title With Maximum caregiver assistance Pt will be able to apply multilayer, LUE compression wraps using gradient techniques to decrease limb volume, to limit infection risk, and to limit lymphedema progression. Pt unable to wrap legs independently. Trained caregiver must assist during visit intervals for CDT to be effective.   ? Baseline dependent - Max caregiver assistance is necessary to complete Intensive Phase of Complete Decongestive Therapy and Lymphedema self-care home program  as Pt is unable to reach his feet to apply compression wraps and garments, to inspect skin, to groom nails to perform skin care and inspection and to perform simple self-MLD. Pt agrees to arrange for consistent caregiver prior to commencing CDT.   ? Time 4   ? Period Days   ? Status Achieved   ? Target Date --   4th OT Rx visit  ?  ? OT LONG TERM GOAL #4  ? Title With Maximum caregiver assistance between OT sessions Pt will achieve at least a 10% limb volume reduction below the knee bilaterally to return limb to more normal size and shape, to limit infection risk, to decrease pain, to improve function, and to limit lymphedema progression.   Pt has achieved 8% voume reduction to date , which is excellent progress towards goals.  ? Baseline dependent   ? Time 12   ? Period Weeks   ? Status Partially Met   achieved for RLE below the knee  ? Target Date 05/16/22   ?  ? OT LONG TERM GOAL #5  ? Title With caregiver assistance  Pt will achieve and sustain a least 85% compliance with all 4 LE self-care home program components throughout Intensive Phase CDT, including  modified simple self-MLD, daily skin inspection and care, lymphatic pumping the ex, 23/7 compression wraps to optimize limb volume reductions, to limit lymphedema progression and to limit further functional decline.   ? Baseline Dependent   ? Time 12   ? Period Weeks   ? Status Partially Met   ? Target Date 05/16/22   ? ?  ?  ? ?  ? ? ? ? ? ? ? ?  Plan - 05/03/22 1120   ? ? Clinical Impression Statement Continued MLD and simultaneous skin care to RLE/ RLQ as established. Reapplied compression wraps as established. Pt cont to make steady progress towards all OT goals for LE care. Cont as  POC.   ? OT Occupational Profile and History Detailed Assessment- Review of Records and additional review of physical, cognitive, psychosocial history related to current functional performance   ? Occupational performance deficits (Please refer to evaluation for details): ADL's;IADL's;Rest and Sleep;Leisure;Social Participation;Work   ? Rehab Potential Fair   ? Clinical Decision Making Several treatment options, min-mod task modification necessary   ? Comorbidities Affecting Occupational Performance: May have comorbidities impacting occupational performance   ? Modification or Assistance to Complete Evaluation  Min-Moderate modification of tasks or assist with assess necessary to complete eval   ? OT Frequency 2x / week   ? OT Duration 12 weeks   ? OT Treatment/Interventions Self-care/ADL training;DME and/or AE instruction;Manual lymph drainage;Compression bandaging;Therapeutic activities;Coping strategies training;Therapeutic exercise;Other (comment);Manual Therapy;Energy conservation;Patient/family education   skin care, Flexitouch trial, fit with appropriate compression garments that are comfortable, effective and that Pt is able to don and doff using assistive devices PRN  ? Recommended Other Services " Calcium channel blockers may not directly impair lymphatic function. However our results show that a reduced lymphatic function  predisposes to CBC edema, which may explain why some patients develop edema during treatment." [employer], Niklas Telinius, Claiborne Billings, and Vibeke Hjortdal.  Reduced Lymphatic Function P

## 2022-05-03 NOTE — Progress Notes (Signed)
Yusko, Kailene C. ([id number]) ?Visit Report for 05/02/2022 ?Physician Orders Details ?Patient Name: [name], [name] ?Date of Service: 05/02/2022 3:00 PM ?Medical Record Number: [id number] ?Patient Account Number: [id number] ?Date of Birth/Sex: 07-03-1961 (61 y.o. F) ?Treating RN: Angelina Pih ?Primary Care Provider: CardJonny Ruiz Other Clinician: ?Referring Provider: Card, John ?Treating Provider/Extender: Allen Derry ?Weeks in Treatment: 28 ?Verbal / Phone Orders: No ?Diagnosis Coding ?Follow-up Appointments ?o Return Appointment in 1 week. ?o Nurse Visit as needed - nurse visit on tuesday and thursday ?Home Health ?o Home Health Company: - [facility] ?o CONTINUE Home Health for wound care. May utilize formulary equivalent dressing for wound treatment orders unless ?otherwise specified. Home Health Nurse may visit PRN to address patientos wound care needs. - 3 times per week ?[facility] fax [phone] ?Bathing/ Shower/ Hygiene ?o May shower; gently cleanse wound with antibacterial soap, rinse and pat dry prior to dressing wounds ?o No tub bath. ?Anesthetic (Use 'Patient Medications' Section for Anesthetic Order Entry) ?o Lidocaine applied to wound bed ?Edema Control - Lymphedema / Segmental Compressive Device / Other ?o Elevate, Exercise Daily and Avoid Standing for Long Periods of Time. ?o Elevate legs to the level of the heart and pump ankles as often as possible ?o Elevate leg(s) parallel to the floor when sitting. ?Additional Orders / Instructions ?o Follow Nutritious Diet and Increase Protein Intake ?Wound Treatment ?Wound #1 - Back Wound Laterality: Midline, Distal ?Cleanser: Normal Saline 1 x Per Day/30 Days ?Discharge Instructions: Wash your hands with soap and water. Remove old dressing, discard into plastic bag and place into trash. ?Cleanse the wound with Normal Saline prior to applying a clean dressing using gauze sponges, not tissues or cotton balls. Do not scrub ?or use excessive force.  Pat dry using gauze sponges, not tissue or cotton balls. ?Primary Dressing: Hydrofera Blue Rope 1 x Per Day/30 Days ?Discharge Instructions: cut into fourths; angle the end ?Secondary Dressing: (SILICONE BORDER) Zetuvit Plus SILICONE BORDER Dressing 4x4 (in/in) 1 x Per Day/30 Days ?Electronic Signature(s) ?Signed: 05/02/2022 2:56:57 PM By: Angelina Pih ?Signed: 05/03/2022 4:41:50 PM By: [name] PA-C ?Entered By: Angelina Pih on 05/02/2022 14:42:14 ?Rawling, Baylee C. ([id number]) ?-------------------------------------------------------------------------------- ?SuperBill Details ?Patient Name: KANIA, REGNIER. ?Date of Service: 05/02/2022 ?Medical Record Number: [id number] ?Patient Account Number: [id number] ?Date of Birth/Sex: 1961-11-30 (61 y.o. 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

## 2022-05-07 ENCOUNTER — Ambulatory Visit: Payer: 59 | Admitting: Occupational Therapy

## 2022-05-07 ENCOUNTER — Encounter: Payer: 59 | Admitting: Physician Assistant

## 2022-05-07 DIAGNOSIS — I890 Lymphedema, not elsewhere classified: Secondary | ICD-10-CM

## 2022-05-07 DIAGNOSIS — T8131XA Disruption of external operation (surgical) wound, not elsewhere classified, initial encounter: Secondary | ICD-10-CM | POA: Diagnosis not present

## 2022-05-07 NOTE — Patient Instructions (Signed)

## 2022-05-07 NOTE — Therapy (Signed)
[facility] ?[place], [name], [address] ?Phone: [phone]   Fax:  [phone] ? ?Occupational Therapy Treatment ? ?Patient Details  ?Name: [name] ?MRN: [id number] ?Date of Birth: Jan 18, 1961 ?Referring Provider (OT): Crystal Arringtonm FNP ? ? ?Encounter Date: 05/07/2022 ? ? OT End of Session - 05/07/22 1316   ? ? Visit Number 16   ? Number of Visits 36   ? Date for OT Re-Evaluation 05/15/22   ? OT Start Time 0110   ? OT Stop Time 0205   ? OT Time Calculation (min) 55 min   ? Activity Tolerance Patient tolerated treatment well;No increased pain   ? Behavior During Therapy [facility] for tasks assessed/performed   ? ?  ?  ? ?  ? ? ?Past Medical History:  ?Diagnosis Date  ? Arthritis   ? Back pain   ? Chronic knee pain   ? Edema of both lower extremities   ? High blood pressure   ? High cholesterol   ? Joint pain   ? Multiple sclerosis ([place])   ? Neuromuscular disorder ([place])   ? Multiple Sclerosis over 20 years  ? Obesity   ? Vitamin D deficiency   ? ? ?Past Surgical History:  ?Procedure Laterality Date  ? APPENDECTOMY    ? CESAREAN SECTION    ? HAND SURGERY    ? lumbar back surgery    ? LUMBAR WOUND DEBRIDEMENT N/A 12/05/2020  ? Procedure: Lumbar Wound Exploration;  Surgeon: Ashok Pall, MD;  Location: [place];  Service: Neurosurgery;  Laterality: N/A;  3C/RM 21  ? LUMBAR WOUND DEBRIDEMENT N/A 02/21/2021  ? Procedure: Lumbar wound exploration;  Surgeon: Ashok Pall, MD;  Location: [place];  Service: Neurosurgery;  Laterality: N/A;  posterior  ? LUMBAR WOUND DEBRIDEMENT N/A 04/11/2021  ? Procedure: Wound Exploration with Wound Vac Placement;  Surgeon: Ashok Pall, MD;  Location: [place];  Service: Neurosurgery;  Laterality: N/A;  ? ROTATOR CUFF REPAIR    ? TONSILLECTOMY    ? ? ?There were no vitals filed for this visit. ? ? Subjective Assessment - 05/07/22 1317   ? ? Subjective  [name] returns to OT for treatment visit 15/36 to address  BLE lymphedema. Pt denies LE related pain in her legs.RLE custom complession knee high has arrived from DME vendor and is ready for fitting.   ? Pertinent History B knee OA, HTN, Obesity, MS, Spondylolisthesis-lumbar, open wound at lumbar surgical site since 4/22.   ? Limitations difficulty walking, impaired transfers, chronic OA pain in bilateral knees, non healing post surgical lumbar wound, BLE muscle weakness 2/2 MS, chronic leg swelling with minimal pain,   ? Special Tests - Stemmer bilaterally; Intake FOTO score =31/100   ? Patient Stated Goals get my legs better so I can do more   ? Currently in Pain? No/denies   ? Pain Onset Other (comment)   6 mnths ago without precipitating event. No family hx  ? Pain Onset Other (comment)   years  ? ?  ?  ? ?  ? ? ? ? ? ? ? ? ? ? ? ? ? ? ? ? ? ? ? ? ? ? ? OT Education - 05/07/22 1601   ? ? Education Details Skilled OT instruction in LE self-care, including custom compression garments, or alternatives, wear and care regimes, garment positioning, contraindications. Reviewed donning and doffing garments using assistive devices .   ? Person(s) Educated  Patient   ? Methods Explanation;Demonstration;Handout   ? Comprehension Verbalized understanding;Returned demonstration;Need further instruction   ? ?  ?  ? ?  ? ? ? ? ? ? OT Long Term Goals - 04/16/22 1359   ? ?  ? OT LONG TERM GOAL #1  ? Title Given this patient?s Intake score 31 /100 on the functional outcomes FOTO tool, patient will experience an increase in function of 5 points to improve basic and instrumental ADLs performance, including lymphedema self-care.   ? Baseline 31/100   ? Period Weeks   ? Status Partially Met   TBA next visit  ? Target Date 05/16/22   ?  ? OT LONG TERM GOAL #2  ? Title Pt will demonstrate understanding of lymphedema prevention strategies by identifying and discussing 5 precautions using printed reference (modified assistance) to reduce risk of progression and to limit infection risk.   ?  Baseline Max A   ? Time 4   ? Period Days   ? Status Achieved   ? Target Date --   4th OT Rx visit  ?  ? OT LONG TERM GOAL #3  ? Title With Maximum caregiver assistance Pt will be able to apply multilayer, LUE compression wraps using gradient techniques to decrease limb volume, to limit infection risk, and to limit lymphedema progression. Pt unable to wrap legs independently. Trained caregiver must assist during visit intervals for CDT to be effective.   ? Baseline dependent - Max caregiver assistance is necessary to complete Intensive Phase of Complete Decongestive Therapy and Lymphedema self-care home program  as Pt is unable to reach his feet to apply compression wraps and garments, to inspect skin, to groom nails to perform skin care and inspection and to perform simple self-MLD. Pt agrees to arrange for consistent caregiver prior to commencing CDT.   ? Time 4   ? Period Days   ? Status Achieved   ? Target Date --   4th OT Rx visit  ?  ? OT LONG TERM GOAL #4  ? Title With Maximum caregiver assistance between OT sessions Pt will achieve at least a 10% limb volume reduction below the knee bilaterally to return limb to more normal size and shape, to limit infection risk, to decrease pain, to improve function, and to limit lymphedema progression.   Pt has achieved 8% voume reduction to date , which is excellent progress towards goals.  ? Baseline dependent   ? Time 12   ? Period Weeks   ? Status Partially Met   achieved for RLE below the knee  ? Target Date 05/16/22   ?  ? OT LONG TERM GOAL #5  ? Title With caregiver assistance  Pt will achieve and sustain a least 85% compliance with all 4 LE self-care home program components throughout Intensive Phase CDT, including modified simple self-MLD, daily skin inspection and care, lymphatic pumping the ex, 23/7 compression wraps to optimize limb volume reductions, to limit lymphedema progression and to limit further functional decline.   ? Baseline Dependent   ? Time 12    ? Period Weeks   ? Status Partially Met   ? Target Date 05/16/22   ? ?  ?  ? ?  ? ? ? ? ? ? ? ? Plan - 05/07/22 1359   ? ? Clinical Impression Statement Completed initial RLE custom, knee length compression garment fitting of Jobst ELVAREX CLASSIC, ccl 2 Flat knit ( 23-32 mmHg) , open toe, t heel, 2.5  cm silicone .top band. Excellent fit. After skilled training Pt is able to don and doff using assistive devices (Tyvek slipper and friction gloves) . Fit and compression class appear appropriate. Reviewed garment wear regime and care instructions and directed Pt to printed manufacturer's resource. Pt expressed comfort in garment. We'll complete functional assessment next visit, then commence CDT to LLE. Cont as per POC.   ? OT Occupational Profile and History Detailed Assessment- Review of Records and additional review of physical, cognitive, psychosocial history related to current functional performance   ? Occupational performance deficits (Please refer to evaluation for details): ADL's;IADL's;Rest and Sleep;Leisure;Social Participation;Work   ? Rehab Potential Fair   ? Clinical Decision Making Several treatment options, min-mod task modification necessary   ? Comorbidities Affecting Occupational Performance: May have comorbidities impacting occupational performance   ? Modification or Assistance to Complete Evaluation  Min-Moderate modification of tasks or assist with assess necessary to complete eval   ? OT Frequency 2x / week   ? OT Duration 12 weeks   ? OT Treatment/Interventions Self-care/ADL training;DME and/or AE instruction;Manual lymph drainage;Compression bandaging;Therapeutic activities;Coping strategies training;Therapeutic exercise;Other (comment);Manual Therapy;Energy conservation;Patient/family education   skin care, Flexitouch trial, fit with appropriate compression garments that are comfortable, effective and that Pt is able to don and doff using assistive devices PRN  ? Recommended Other Services "  Calcium channel blockers may not directly impair lymphatic function. However our results show that a reduced lymphatic function predisposes to CBC edema, which may explain why some patients develop edema duri

## 2022-05-07 NOTE — Progress Notes (Addendum)
Payeur, Deolinda C. ([id number]) ?Visit Report for 05/07/2022 ?Chief Complaint Document Details ?Patient Name: [name], [name] ?Date of Service: 05/07/2022 11:00 AM ?Medical Record Number: [id number] ?Patient Account Number: [id number] ?Date of Birth/Sex: Jan 09, 1961 (61 y.o. F) ?Treating RN: Yevonne Pax ?Primary Care Provider: CardJonny Ruiz Other Clinician: ?Referring Provider: Card, John ?Treating Provider/Extender: Allen Derry ?Weeks in Treatment: 29 ?Information Obtained from: Patient ?Chief Complaint ?Surgical Back Ulcer ?Electronic Signature(s) ?Signed: 05/07/2022 11:13:56 AM By: [name] PA-C ?Entered By: [name] on 05/07/2022 11:13:55 ?Salceda, Breckyn C. ([id number]) ?-------------------------------------------------------------------------------- ?HPI Details ?Patient Name: [name], [name] ?Date of Service: 05/07/2022 11:00 AM ?Medical Record Number: [id number] ?Patient Account Number: [id number] ?Date of Birth/Sex: 27-Jul-1961 (61 y.o. F) ?Treating RN: Yevonne Pax ?Primary Care Provider: CardJonny Ruiz Other Clinician: ?Referring Provider: Card, John ?Treating Provider/Extender: Allen Derry ?Weeks in Treatment: 29 ?History of Present Illness ?HPI Description: 10/15/2021 upon evaluation today patient presents for initial evaluation here in the clinic concerning a surgical ?ulceration/dehiscence in the lumbar spine region following surgery that she had over the past year. This was actually broken up into 3 separate ?surgical events. The initial surgical intervention actually was on November 05, 2021 almost a year ago. Subsequently the patient went back in ?February for a seroma of the area which unfortunately required her to have a repeat surgery to go in and clean this out. And then again this ?occurred in April where she went back in and again they felt like stitches were coming out and there was an additional seroma. She was placed in ?a wound VAC initially and then subsequently as it got smaller that was  discontinued. Again right now I will see anything that I think a wound VAC ?would help with. Nonetheless she definitely has a significant depth to the wound that is going require packing. I actually believe the Hydrofera ?Blue rope would probably do quite well with this the problem is as much as it is draining she probably needs this to be changed at least every day. ?She does not really have anyone that can help with that that is the complicating scenario here. With that being said the patient does have a ?history of multiple sclerosis, hypertension, and again this surgical wound dehiscence in regard to her lumbar spine region. She did have a repeat ?MRI which was actually completed 10/09/2021. This showed that there was no significant change in the subcutaneous fluid collection/track of the ?lower lumbar region. This is extending to the level of the fascia unfortunately. This seems to go all the way from the L2 level with a track ?extending all the way to the fascia at the L4-5 level. Again this is a significant wound and there is significant drainage but does not seem to ?communicate to the spinal region as far as spinal fluid or otherwise is concerned that is good news. Nonetheless she last saw Dr. Franky Macho who is ?her neurosurgeon on 10/01/2021 that was when he ordered this last MRI she supposed to see him next week as well. With that being said he did ?not feel like there was any significant issue there but was not sure why this was not healing that is when he ordered the MRI. They were wanting ?to make sure that this was packed appropriately by home health unfortunately the main issue currently is that home health is completely out of ?the picture as the patient has exhausted all the home health that that she gets for a year. She is now in a very difficult  predicament where she ?does not have anyone to help her change the dressing and to be honest that she is not able to do it herself with the location of the  wound being ?on the midline lumbar spine region. If she does not have anyone that can help it is probably can to be necessary for her to go to a facility for ?rehab and daily dressing changes as I feel like daily changes which is much drainage that she is having is going to be necessary. ?10/23/2021 upon evaluation today patient appears to be doing decently well in regard to her back ulcer. This does seem to be draining a lot less ?than what it is been doing in the past. With that being said she still has quite a bit of drainage nonetheless. I do think that given time this should ?improve least I hope so. The good news is she does have home health coming out 3 days a week were doing it 2 days a week and she is paying ?someone we can to help. ?10/30/2021 upon evaluation today patient appears to be doing okay in regard to her back ulcer this is not draining quite as bad as it was in the ?beginning but he still has quite a bit of drainage noted. I do believe that the patient would benefit from [place] going ahead forward with attempting a ?wound VAC using the Hydrofera Blue rope to pack with and then subsequently using the VAC externally to actually suction out and help this to fill- ?in. I think this is our ideal way to try to get things cleared at this point. As it stands I am not certain that we are really making a progress that we ?want to see near with doing it in the way we are which is packing with the rope. It is a good dressing but I do think it is insufficient for total ?healing. She just seems to have too much in the way of drainage at this point unfortunately. ?11/06/2021 upon evaluation today patient unfortunately continues to have issues with her back ongoing. The good news is her MRI that was ?repeated showed signs of the size of this area in the lumbar spine region having decreased from 4 cm to 3.5 cm this is definitely not bad news at ?all. With that being said unfortunately she continues to have issues with  ongoing drainage not as severe as in the beginning but nonetheless still ?significant. I do think a wound VAC still would be a good way to go although her home health agency nurse apparently has some concerns about ?the possibility of not being able to keep a seal with this as they had struggles in the past. Nonetheless I explained to the patient that this is much ?different than what she had previous and that I really feel like it would do much better as far as getting the area taken care of without having any ?complications or issues here. I think that we should be able to maintain a seal. Nonetheless at this time I did discuss with the patient as well that ?she probably does need to have a wound VAC in order for [place] to get this moving in the right direction. ?11/13/2021 upon evaluation patient's wound bed actually showed signs of significant drainage at this time. She did see the surgeon yesterday he ?did not see anything that appeared to be infected. Nonetheless he does appear that she is continuing to have areas here that just do not seem  to ?want to seal up there MRI findings have been negative but nonetheless she continues to have is the seroma that is filling in. I do feel like we need ?to try to widen the hole so we can get at least a half of the [facility] then this will be better than nothing at this point. ?11/20/2021 upon evaluation today patient actually appears to be having less pain at this point which is good news and overall she we still do not ?have the results of the culture back yet it had to be sent out to [facility] and we do not have the result back yet. Is doing decently well in regard to ?her wound. Fortunately there does not appear to be any signs of active infection systemically nor locally at this time. ?11/27/2021 upon evaluation today patient appears to be doing well with regard to her wound all things considered there does appear to be less ?drainage than there was previous.  Fortunately I do not see any evidence of worsening of the patient is stating that she is having some issues with ?back pain. This is somewhat new. Again this I think could be related to the fact that she is hav

## 2022-05-07 NOTE — Progress Notes (Signed)
Favorite, Luke C. ([id number]) ?Visit Report for 04/25/2022 ?Physician Orders Details ?Patient Name: [name], [name] ?Date of Service: 04/25/2022 3:30 PM ?Medical Record Number: [id number] ?Patient Account Number: [id number] ?Date of Birth/Sex: [birth year] (61 y.o. F) ?Treating RN: Yevonne Pax ?Primary Care Provider: CardJonny Ruiz Other Clinician: ?Referring Provider: Card, John ?Treating Provider/Extender: Maxwell Caul ?Weeks in Treatment: 27 ?Verbal / Phone Orders: No ?Diagnosis Coding ?Follow-up Appointments ?o Return Appointment in 1 week. ?o Nurse Visit as needed - nurse visit on tuesday and thursday ?Home Health ?o Home Health Company: - [facility] ?o CONTINUE Home Health for wound care. May utilize formulary equivalent dressing for wound treatment orders unless ?otherwise specified. Home Health Nurse may visit PRN to address patientos wound care needs. - 3 times per week ?[facility] fax [phone] ?Bathing/ Shower/ Hygiene ?o May shower; gently cleanse wound with antibacterial soap, rinse and pat dry prior to dressing wounds ?o No tub bath. ?Anesthetic (Use 'Patient Medications' Section for Anesthetic Order Entry) ?o Lidocaine applied to wound bed ?Edema Control - Lymphedema / Segmental Compressive Device / Other ?o Elevate, Exercise Daily and Avoid Standing for Long Periods of Time. ?o Elevate legs to the level of the heart and pump ankles as often as possible ?o Elevate leg(s) parallel to the floor when sitting. ?Additional Orders / Instructions ?o Follow Nutritious Diet and Increase Protein Intake ?Wound Treatment ?Wound #1 - Back Wound Laterality: Midline, Distal ?Cleanser: Normal Saline 1 x Per Day/30 Days ?Discharge Instructions: Wash your hands with soap and water. Remove old dressing, discard into plastic bag and place into trash. ?Cleanse the wound with Normal Saline prior to applying a clean dressing using gauze sponges, not tissues or cotton balls. Do not scrub ?or use excessive  force. Pat dry using gauze sponges, not tissue or cotton balls. ?Secondary Dressing: (SILICONE BORDER) Zetuvit Plus SILICONE BORDER Dressing 4x4 (in/in) 1 x Per Day/30 Days ?Electronic Signature(s) ?Signed: 04/25/2022 4:20:03 PM By: Yevonne Pax RN ?Signed: 05/07/2022 3:56:20 PM By: Baltazar Najjar MD ?Entered By: Yevonne Pax on 04/25/2022 16:20:02 ?Nevins, Amarya C. ([id number]) ?-------------------------------------------------------------------------------- ?SuperBill Details ?Patient Name: [name], [name]. ?Date of Service: 04/25/2022 ?Medical Record Number: [id number] ?Patient Account Number: [id number] ?Date of Birth/Sex: Dec 25, 1961 (60 y.o. 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

## 2022-05-08 ENCOUNTER — Encounter: Payer: 59 | Admitting: Occupational Therapy

## 2022-05-08 NOTE — Progress Notes (Signed)
[name], [name] ([id number]) ?Visit Report for 05/07/2022 ?Arrival Information Details ?Patient Name: [name], [name] ?Date of Service: 05/07/2022 11:00 AM ?Medical Record Number: [id number] ?Patient Account Number: [id number] ?Date of Birth/Sex: 26-Jun-1961 (61 y.o. F) ?Treating RN: Carlene Coria ?Primary Care Esme Freund: CardJenny Reichmann Other Clinician: ?Referring Benjamin Casanas: Card, John ?Treating Lavarius Doughten/Extender: Jeri Cos ?Weeks in Treatment: 29 ?Visit Information History Since Last Visit ?All ordered tests and consults were completed: No ?Patient Arrived: Wheel Chair ?Added or deleted any medications: No ?Arrival Time: 11:01 ?Any new allergies or adverse reactions: No ?Accompanied By: self ?Had a fall or experienced change in No ?Transfer Assistance: None ?activities of daily living that may affect ?Patient Identification Verified: Yes ?risk of falls: ?Secondary Verification Process Completed: Yes ?Signs or symptoms of abuse/neglect since last visito No ?Patient Requires Transmission-Based Precautions: No ?Hospitalized since last visit: No ?Patient Has Alerts: No ?Implantable device outside of the clinic excluding No ?cellular tissue based products placed in the center ?since last visit: ?Has Dressing in Place as Prescribed: Yes ?Pain Present Now: Yes ?Electronic Signature(s) ?Signed: 05/08/2022 3:40:11 PM By: Carlene Coria RN ?Entered By: Carlene Coria on 05/07/2022 11:07:47 ?[name], [name] ([id number]) ?-------------------------------------------------------------------------------- ?Clinic Level of Care Assessment Details ?Patient Name: [name], [name] ?Date of Service: 05/07/2022 11:00 AM ?Medical Record Number: [id number] ?Patient Account Number: [id number] ?Date of Birth/Sex: [birth year] (61 y.o. F) ?Treating RN: Carlene Coria ?Primary Care Truxton Stupka: CardJenny Reichmann Other Clinician: ?Referring Edwar Coe: Card, John ?Treating Leyla Soliz/Extender: Jeri Cos ?Weeks in Treatment: 29 ?Clinic Level of Care Assessment  Items ?TOOL 4 Quantity Score ?X - Use when only an EandM is performed on FOLLOW-UP visit 1 0 ?ASSESSMENTS - Nursing Assessment / Reassessment ?X - Reassessment of Co-morbidities (includes updates in patient status) 1 10 ?X- 1 5 ?Reassessment of Adherence to Treatment Plan ?ASSESSMENTS - Wound and Skin Assessment / Reassessment ?X - Simple Wound Assessment / Reassessment - one wound 1 5 ?[]  - 0 ?Complex Wound Assessment / Reassessment - multiple wounds ?[]  - 0 ?Dermatologic / Skin Assessment (not related to wound area) ?ASSESSMENTS - Focused Assessment ?[]  - Circumferential Edema Measurements - multi extremities 0 ?[]  - 0 ?Nutritional Assessment / Counseling / Intervention ?[]  - 0 ?Lower Extremity Assessment (monofilament, tuning fork, pulses) ?[]  - 0 ?Peripheral Arterial Disease Assessment (using hand held doppler) ?ASSESSMENTS - Ostomy and/or Continence Assessment and Care ?[]  - Incontinence Assessment and Management 0 ?[]  - 0 ?Ostomy Care Assessment and Management (repouching, etc.) ?PROCESS - Coordination of Care ?X - Simple Patient / Family Education for ongoing care 1 15 ?[]  - 0 ?Complex (extensive) Patient / Family Education for ongoing care ?[]  - 0 ?Staff obtains Consents, Records, Test Results / Process Orders ?[]  - 0 ?Staff telephones HHA, Nursing Homes / Clarify orders / etc ?[]  - 0 ?Routine Transfer to another Facility (non-emergent condition) ?[]  - 0 ?Routine Hospital Admission (non-emergent condition) ?[]  - 0 ?New Admissions / Biomedical engineer / Ordering NPWT, Apligraf, etc. ?[]  - 0 ?Emergency Hospital Admission (emergent condition) ?X- 1 10 ?Simple Discharge Coordination ?[]  - 0 ?Complex (extensive) Discharge Coordination ?PROCESS - Special Needs ?[]  - Pediatric / Minor Patient Management 0 ?[]  - 0 ?Isolation Patient Management ?[]  - 0 ?Hearing / Language / Visual special needs ?[]  - 0 ?Assessment of Community assistance (transportation, D/C planning, etc.) ?[]  - 0 ?Additional assistance /  Altered mentation ?[]  - 0 ?Support Surface(s) Assessment (bed, cushion, seat, etc.) ?INTERVENTIONS - Wound Cleansing / Measurement ?Salone, Valari C. ([id number]) ?X- 1 5 ?Simple Wound Cleansing -  one wound ?[]  - 0 ?Complex Wound Cleansing - multiple wounds ?X- 1 5 ?Wound Imaging (photographs - any number of wounds) ?[]  - 0 ?Wound Tracing (instead of photographs) ?X- 1 5 ?Simple Wound Measurement - one wound ?[]  - 0 ?Complex Wound Measurement - multiple wounds ?INTERVENTIONS - Wound Dressings ?[]  - Small Wound Dressing one or multiple wounds 0 ?X- 1 15 ?Medium Wound Dressing one or multiple wounds ?[]  - 0 ?Large Wound Dressing one or multiple wounds ?[]  - 0 ?Application of Medications - topical ?[]  - 0 ?Application of Medications - injection ?INTERVENTIONS - Miscellaneous ?[]  - External ear exam 0 ?[]  - 0 ?Specimen Collection (cultures, biopsies, blood, body fluids, etc.) ?[]  - 0 ?Specimen(s) / Culture(s) sent or taken to Lab for analysis ?[]  - 0 ?Patient Transfer (multiple staff / [employer] fast streamer / Similar devices) ?[]  - 0 ?Simple Staple / Suture removal (25 or less) ?[]  - 0 ?Complex Staple / Suture removal (26 or more) ?[]  - 0 ?Hypo / Hyperglycemic Management (close monitor of Blood Glucose) ?[]  - 0 ?Ankle / Brachial Index (ABI) - do not check if billed separately ?X- 1 5 ?Vital Signs ?Has the patient been seen at the hospital within the last three years: Yes ?Total Score: 80 ?Level Of Care: New/Established - Level ?3 ?Electronic Signature(s) ?Signed: 05/08/2022 3:40:11 PM By: Carlene Coria RN ?Entered By: Carlene Coria on 05/07/2022 11:39:28 ?[name], [name] ([id number]) ?-------------------------------------------------------------------------------- ?Encounter Discharge Information Details ?Patient Name: [name], [name] ?Date of Service: 05/07/2022 11:00 AM ?Medical Record Number: [id number] ?Patient Account Number: [id number] ?Date of Birth/Sex: [birth year] (61 y.o. F) ?Treating RN: Carlene Coria ?Primary Care  Puanani Gene: CardJenny Reichmann Other Clinician: ?Referring Inice Sanluis: Card, John ?Treating Pernie Grosso/Extender: Jeri Cos ?Weeks in Treatment: 29 ?Encounter Discharge Information Items ?Discharge Condition: Stable ?Ambulatory Status: Ambulatory ?Discharge Destination: Home ?Transportation: Private Auto ?Accompanied By: self ?Schedule Follow-up Appointment: Yes ?Clinical Summary of Care: Patient Declined ?Electronic Signature(s) ?Signed: 05/08/2022 3:40:11 PM By: Carlene Coria RN ?Entered By: Carlene Coria on 05/07/2022 11:42:34 ?[name], [name] ([id number]) ?-------------------------------------------------------------------------------- ?Lower Extremity Assessment Details ?Patient Name: [name], [name] ?Date of Service: 05/07/2022 11:00 AM ?Medical Record Number: [id number] ?Patient Account Number: [id number] ?Date of Birth/Sex: 27-Dec-1961 (61 y.o. F) ?Treating RN: Carlene Coria ?Primary Care Jacobs Golab: CardJenny Reichmann Other Clinician: ?Referring Zavier Canela: Card, John ?Treating Vinie Charity/Extender: Jeri Cos ?Weeks in Treatment: 29 ?Electronic Signature(s) ?Signed: 05/08/2022 3:40:11 PM By: Carlene Coria RN ?Entered By: Carlene Coria on 05/07/2022 11:11:58 ?[name], [name] ([id number]) ?-------------------------------------------------------------------------------- ?Multi Wound Chart Details ?Patient Name: [name], [name] ?Date of Service: 05/07/2022 11:00 AM ?Medical Record Number: [id number] ?Patient Account Number: [id number] ?Date of Birth/Sex: [birth year] (61 y.o. F) ?Treating RN: Carlene Coria ?Primary Care Rishik Tubby: CardJenny Reichmann Other Clinician: ?Referring Lalia Loudon: Card, John ?Treating Jailyn Langhorst/Extender: Jeri Cos ?Weeks in Treatment: 29 ?Photos: [N/A:N/A] ?Wound Location: Distal, Midline Back N/A N/A ?Wounding Event: Surgical Injury N/A N/A ?Primary Etiology: Dehisced Wound N/A N/A ?Comorbid History: Hypertension N/A N/A ?Date Acquired: 04/11/2021 N/A N/A ?Weeks of Treatment: 29 N/A N/A ?Wound Status: Open N/A N/A ?Wound  Recurrence: No N/A N/A ?Measurements L x W x D (cm) 0.5x0.3x2.6 N/A N/A ?Area (cm?) : 0.118 N/A N/A ?Volume (cm?) : 0.306 N/A N/A ?% Reduction in Area: 6.30% N/A N/A ?% Reduction in Volume: 47.10% N/A N/A ?Classification: Full

## 2022-05-09 DIAGNOSIS — T8131XA Disruption of external operation (surgical) wound, not elsewhere classified, initial encounter: Secondary | ICD-10-CM | POA: Diagnosis not present

## 2022-05-10 ENCOUNTER — Ambulatory Visit: Payer: 59 | Admitting: Occupational Therapy

## 2022-05-10 DIAGNOSIS — I890 Lymphedema, not elsewhere classified: Secondary | ICD-10-CM | POA: Diagnosis not present

## 2022-05-10 NOTE — Progress Notes (Signed)
[name], [name] ([id number]) ?Visit Report for 05/09/2022 ?Arrival Information Details ?Patient Name: [name], [name] ?Date of Service: 05/09/2022 3:30 PM ?Medical Record Number: [id number] ?Patient Account Number: [id number] ?Date of Birth/Sex: 1961-04-27 (61 y.o. F) ?Treating RN: Yevonne Pax ?Primary Care Jim Philemon: CardJonny Ruiz Other Clinician: ?Referring Maicy Filip: Card, John ?Treating Lestat Golob/Extender: Allen Derry ?Weeks in Treatment: 29 ?Visit Information History Since Last Visit ?All ordered tests and consults were completed: No ?Patient Arrived: [name] ?Added or deleted any medications: No ?Arrival Time: 15:45 ?Any new allergies or adverse reactions: No ?Accompanied By: self ?Had a fall or experienced change in No ?Transfer Assistance: None ?activities of daily living that may affect ?Patient Identification Verified: No ?risk of falls: ?Secondary Verification Process Completed: No ?Signs or symptoms of abuse/neglect since last visito No ?Patient Requires Transmission-Based Precautions: No ?Hospitalized since last visit: No ?Patient Has Alerts: No ?Implantable device outside of the clinic excluding No ?cellular tissue based products placed in the center ?since last visit: ?Has Dressing in Place as Prescribed: Yes ?Pain Present Now: No ?Electronic Signature(s) ?Signed: 05/10/2022 8:37:06 AM By: Yevonne Pax RN ?Entered By: Yevonne Pax on 05/10/2022 08:37:06 ?[name], [name] ([id number]) ?-------------------------------------------------------------------------------- ?Clinic Level of Care Assessment Details ?Patient Name: [name], [name] ?Date of Service: 05/09/2022 3:30 PM ?Medical Record Number: [id number] ?Patient Account Number: [id number] ?Date of Birth/Sex: 09/23/1961 (61 y.o. F) ?Treating RN: Yevonne Pax ?Primary Care Chena Chohan: CardJonny Ruiz Other Clinician: ?Referring Unika Nazareno: Card, John ?Treating Verbon Giangregorio/Extender: Allen Derry ?Weeks in Treatment: 29 ?Clinic Level of Care Assessment Items ?TOOL 4  Quantity Score ?X - Use when only [name] EandM is performed on FOLLOW-UP visit 1 0 ?ASSESSMENTS - Nursing Assessment / Reassessment ?X - Reassessment of Co-morbidities (includes updates in patient status) 1 10 ?X- 1 5 ?Reassessment of Adherence to Treatment Plan ?ASSESSMENTS - Wound and Skin Assessment / Reassessment ?X - Simple Wound Assessment / Reassessment - one wound 1 5 ?[]  - 0 ?Complex Wound Assessment / Reassessment - multiple wounds ?[]  - 0 ?Dermatologic / Skin Assessment (not related to wound area) ?ASSESSMENTS - Focused Assessment ?[]  - Circumferential Edema Measurements - multi extremities 0 ?[]  - 0 ?Nutritional Assessment / Counseling / Intervention ?[]  - 0 ?Lower Extremity Assessment (monofilament, tuning fork, pulses) ?[]  - 0 ?Peripheral Arterial Disease Assessment (using hand held doppler) ?ASSESSMENTS - Ostomy and/or Continence Assessment and Care ?[]  - Incontinence Assessment and Management 0 ?[]  - 0 ?Ostomy Care Assessment and Management (repouching, etc.) ?PROCESS - Coordination of Care ?X - Simple Patient / Family Education for ongoing care 1 15 ?[]  - 0 ?Complex (extensive) Patient / Family Education for ongoing care ?[]  - 0 ?Staff obtains Consents, Records, Test Results / Process Orders ?[]  - 0 ?Staff telephones HHA, Nursing Homes / Clarify orders / etc ?[]  - 0 ?Routine Transfer to another Facility (non-emergent condition) ?[]  - 0 ?Routine Hospital Admission (non-emergent condition) ?[]  - 0 ?New Admissions / / Ordering NPWT, Apligraf, etc. ?[]  - 0 ?Emergency Hospital Admission (emergent condition) ?X- 1 10 ?Simple Discharge Coordination ?[]  - 0 ?Complex (extensive) Discharge Coordination ?PROCESS - Special Needs ?[]  - Pediatric / Minor Patient Management 0 ?[]  - 0 ?Isolation Patient Management ?[]  - 0 ?Hearing / Language / Visual special needs ?[]  - 0 ?Assessment of Community assistance (transportation, D/C planning, etc.) ?[]  - 0 ?Additional assistance / Altered  mentation ?[]  - 0 ?Support Surface(s) Assessment (bed, cushion, seat, etc.) ?INTERVENTIONS - Wound Cleansing / Measurement ?[name], [name] ( ) ?X- 1 5 ?Simple Wound Cleansing -  one wound ?[]  - 0 ?Complex Wound Cleansing - multiple wounds ?[]  - 0 ?Wound Imaging (photographs - any number of wounds) ?[]  - 0 ?Wound Tracing (instead of photographs) ?X- 1 5 ?Simple Wound Measurement - one wound ?[]  - 0 ?Complex Wound Measurement - multiple wounds ?INTERVENTIONS - Wound Dressings ?[]  - Small Wound Dressing one or multiple wounds 0 ?X- 1 15 ?Medium Wound Dressing one or multiple wounds ?[]  - 0 ?Large Wound Dressing one or multiple wounds ?[]  - 0 ?Application of Medications - topical ?[]  - 0 ?Application of Medications - injection ?INTERVENTIONS - Miscellaneous ?[]  - External ear exam 0 ?[]  - 0 ?Specimen Collection (cultures, biopsies, blood, body fluids, etc.) ?[]  - 0 ?Specimen(s) / Culture(s) sent or taken to Lab for analysis ?[]  - 0 ?Patient Transfer (multiple staff / / Similar devices) ?[]  - 0 ?Simple Staple / Suture removal (25 or less) ?[]  - 0 ?Complex Staple / Suture removal (26 or more) ?[]  - 0 ?Hypo / Hyperglycemic Management (close monitor of Blood Glucose) ?[]  - 0 ?Ankle / Brachial Index (ABI) - do not check if billed separately ?X- 1 5 ?Vital Signs ?Has the patient been seen at the hospital within the last three years: Yes ?Total Score: 75 ?Level Of Care: New/Established - ?Level 2 ?Electronic Signature(s) ?Unsigned ?Entered By: on 05/10/2022 08:39:02 ?Signature(s): ?Date(s): ?[name], [name] ( ) ?-------------------------------------------------------------------------------- ?Encounter Discharge Information Details ?Patient Name: [name], [name] ?Date of Service: 05/09/2022 3:30 PM ?Medical Record Number: ?Patient Account Number: ?Date of Birth/Sex: 01-21-1961 (61 y.o. F) ?Treating RN: ?Primary Care Raffael Bugarin: Card Other  Clinician: ?Referring Khyre Germond: Card, John ?Treating Kayven Aldaco/Extender: Nurse, adult ?Weeks in Treatment: 29 ?Encounter Discharge Information Items ?Discharge Condition: Stable ?Ambulatory Status: ?Discharge Destination: Home ?Transportation: Private Auto ?Accompanied By: self ?Schedule Follow-up Appointment: Yes ?Clinical Summary of Care: Patient Declined ?Electronic Signature(s) ?Signed: 05/10/2022 8:38:25 AM By: RN ?Entered ByYevonne Pax on 05/10/2022 08:38:25 ?Fahey, Gray C. ([id number]) ?-------------------------------------------------------------------------------- ?Wound Assessment Details ?Patient Name: [name], [name] ?Date of Service: 05/09/2022 3:30 PM ?Medical Record Number: [id number] ?Patient Account Number: [id number] ?Date of Birth/Sex: [birth year] (61 y.o. F) ?Treating RN: Yevonne Pax ?Primary Care Zuleyma Scharf: CardJonny Ruiz Other Clinician: ?Referring Sharene Krikorian: Card, John ?Treating Nyree Yonker/Extender: Allen Derry ?Weeks in Treatment: 29 ?Wound Status ?Wound Number: 1 ?Primary Etiology: Dehisced Wound ?Wound Location: Distal, Midline Back ?Wound Status: Open ?Wounding Event: Surgical Injury ?Comorbid History: Hypertension ?Date Acquired: 04/11/2021 ?Weeks Of Treatment: 29 ?Clustered Wound: No ?Wound Measurements ?Length: (cm) 0.5 ?Width: (cm) 0.3 ?Depth: (cm) 2.6 ?Area: (cm?) 0.118 ?Volume: (cm?) 0.306 ?% Reduction in Area: 6.3% ?% Reduction in Volume: 47.1% ?Epithelialization: None ?Tunneling: No ?Undermining: No ?Wound Description ?Classification: Full Thickness Without Exposed Support Structu ?Exudate Amount: Medium ?Exudate Type: Serous ?Exudate Color: amber ?res Foul Odor After Cleansing: No ?Slough/Fibrino No ?Wound Bed ?Granulation Amount: Large (67-100%) Exposed Structure ?Granulation Quality: Red ?Fascia Exposed: No ?Necrotic Amount: None Present (0%) ?Fat Layer (Subcutaneous Tissue) Exposed: Yes ?Tendon Exposed: No ?Muscle Exposed: No ?Joint Exposed: No ?Bone Exposed:  No ?Treatment Notes ?Wound #1 (Back) Wound Laterality: Midline, Distal ?Cleanser ?Normal Saline ?Discharge Instruction: Wash your hands with soap and water. Remove old dressing, discard into plastic bag and place into

## 2022-05-10 NOTE — Therapy (Signed)
[facility] ?[place], [name], [address] ?Phone: [phone]   Fax:  [phone] ? ?Occupational Therapy Treatment ? ?Patient Details  ?Name: [name] ?MRN: [id number] ?Date of Birth: 12-20-1961 ?Referring Provider (OT): Crystal Arringtonm FNP ? ? ?Encounter Date: 05/10/2022 ? ? OT End of Session - 05/10/22 1112   ? ? Visit Number 17   ? Number of Visits 36   ? Date for OT Re-Evaluation 05/15/22   ? OT Start Time 1105   ? OT Stop Time 1205   ? OT Time Calculation (min) 60 min   ? Activity Tolerance Patient tolerated treatment well;No increased pain   ? Behavior During Therapy [facility] for tasks assessed/performed   ? ?  ?  ? ?  ? ? ?Past Medical History:  ?Diagnosis Date  ? Arthritis   ? Back pain   ? Chronic knee pain   ? Edema of both lower extremities   ? High blood pressure   ? High cholesterol   ? Joint pain   ? Multiple sclerosis ([place])   ? Neuromuscular disorder ([place])   ? Multiple Sclerosis over 20 years  ? Obesity   ? Vitamin D deficiency   ? ? ?Past Surgical History:  ?Procedure Laterality Date  ? APPENDECTOMY    ? CESAREAN SECTION    ? HAND SURGERY    ? lumbar back surgery    ? LUMBAR WOUND DEBRIDEMENT N/A 12/05/2020  ? Procedure: Lumbar Wound Exploration;  Surgeon: Ashok Pall, MD;  Location: [place];  Service: Neurosurgery;  Laterality: N/A;  3C/RM 21  ? LUMBAR WOUND DEBRIDEMENT N/A 02/21/2021  ? Procedure: Lumbar wound exploration;  Surgeon: Ashok Pall, MD;  Location: [place] [facility];  Service: Neurosurgery;  Laterality: N/A;  posterior  ? LUMBAR WOUND DEBRIDEMENT N/A 04/11/2021  ? Procedure: Wound Exploration with Wound Vac Placement;  Surgeon: Ashok Pall, MD;  Location: [place];  Service: Neurosurgery;  Laterality: N/A;  ? ROTATOR CUFF REPAIR    ? TONSILLECTOMY    ? ? ?There were no vitals filed for this visit. ? ? Subjective Assessment - 05/10/22 1117   ? ? Subjective  [name] returns to OT for treatment visit to address BLE  lymphedema. Pt denies LE related pain in her legs.RLE custom complession fits well and is comfortable by Pt report. "Im not having any problems. I'm wearing it every day."   ? Pertinent History B knee OA, HTN, Obesity, MS, Spondylolisthesis-lumbar, open wound at lumbar surgical site since 4/22.   ? Limitations difficulty walking, impaired transfers, chronic OA pain in bilateral knees, non healing post surgical lumbar wound, BLE muscle weakness 2/2 MS, chronic leg swelling with minimal pain,   ? Special Tests - Stemmer bilaterally; Intake FOTO score =31/100   ? Patient Stated Goals get my legs better so I can do more   ? Currently in Pain? No/denies   ? Pain Onset Other (comment)   6 mnths ago without precipitating event. No family hx  ? Pain Onset Other (comment)   years  ? ?  ?  ? ?  ? ? ? ? ? ? ? ? ? ? ? ? ? ? ? OT Treatments/Exercises (OP) - 05/10/22 1119   ? ?  ? ADLs  ? ADL Education Given Yes   ?  ? Manual Therapy  ? Manual Therapy Edema management;Manual Lymphatic Drainage (MLD);Compression Bandaging   ? Manual Lymphatic Drainage (MLD) MLD to LLE/ LLQ  utilizing diaphragmatic breathing to access deep abdominal pathways, short neck sequence and inguinal LN, then proovided MLD from proximal to distal addressing thigh, leg and foot. Foinished up session with short neck x 3 and deep breathing.   ? Compression Bandaging LLE multilayer compression  wraps frome base of toes to popliteal using gradient techniques   ? ?  ?  ? ?  ? ? ? ? ? ? ? ? ? OT Education - 05/10/22 1155   ? ? Education Details Continued skilled Pt/caregiver education  And LE ADL training throughout visit for lymphedema self care/ home program, including compression wrapping, compression garment and device wear/care, lymphatic pumping ther ex, simple self-MLD, and skin care. Discussed initial volumetric measurements. Discussed all long term goals.   ? Person(s) Educated Patient   ? Methods Explanation;Demonstration;Handout   ? Comprehension  Verbalized understanding;Returned demonstration;Need further instruction   ? ?  ?  ? ?  ? ? ? ? ? ? OT Long Term Goals - 04/16/22 1359   ? ?  ? OT LONG TERM GOAL #1  ? Title Given this patient?s Intake score 31 /100 on the functional outcomes FOTO tool, patient will experience an increase in function of 5 points to improve basic and instrumental ADLs performance, including lymphedema self-care.   ? Baseline 31/100   ? Period Weeks   ? Status Partially Met   TBA next visit  ? Target Date 05/16/22   ?  ? OT LONG TERM GOAL #2  ? Title Pt will demonstrate understanding of lymphedema prevention strategies by identifying and discussing 5 precautions using printed reference (modified assistance) to reduce risk of progression and to limit infection risk.   ? Baseline Max A   ? Time 4   ? Period Days   ? Status Achieved   ? Target Date --   4th OT Rx visit  ?  ? OT LONG TERM GOAL #3  ? Title With Maximum caregiver assistance Pt will be able to apply multilayer, LUE compression wraps using gradient techniques to decrease limb volume, to limit infection risk, and to limit lymphedema progression. Pt unable to wrap legs independently. Trained caregiver must assist during visit intervals for CDT to be effective.   ? Baseline dependent - Max caregiver assistance is necessary to complete Intensive Phase of Complete Decongestive Therapy and Lymphedema self-care home program  as Pt is unable to reach his feet to apply compression wraps and garments, to inspect skin, to groom nails to perform skin care and inspection and to perform simple self-MLD. Pt agrees to arrange for consistent caregiver prior to commencing CDT.   ? Time 4   ? Period Days   ? Status Achieved   ? Target Date --   4th OT Rx visit  ?  ? OT LONG TERM GOAL #4  ? Title With Maximum caregiver assistance between OT sessions Pt will achieve at least a 10% limb volume reduction below the knee bilaterally to return limb to more normal size and shape, to limit infection  risk, to decrease pain, to improve function, and to limit lymphedema progression.   Pt has achieved 8% voume reduction to date , which is excellent progress towards goals.  ? Baseline dependent   ? Time 12   ? Period Weeks   ? Status Partially Met   achieved for RLE below the knee  ? Target Date 05/16/22   ?  ? OT LONG TERM GOAL #5  ? Title With caregiver assistance  Pt will achieve and sustain a least 85% compliance with all 4 LE self-care home program components throughout Intensive Phase CDT, including modified simple self-MLD, daily skin inspection and care, lymphatic pumping the ex, 23/7 compression wraps to optimize limb volume reductions, to limit lymphedema progression and to limit further functional decline.   ? Baseline Dependent   ? Time 12   ? Period Weeks   ? Status Partially Met   ? Target Date 05/16/22   ? ?  ?  ? ?  ? ? ? ? ? ? ? ? Plan - 05/10/22 1113   ? ? Clinical Impression Statement Provided MLD and simultaneous skin care to LLE/ LLQ as established. Applied L compression wraps as established Pt remains compliant with aspects of home program she has learned thus far. Fit Pt very pleased with new RLE compression stocking. Fits very well and contains lymphedema appropriately. Cont as per POC.   ? OT Occupational Profile and History Detailed Assessment- Review of Records and additional review of physical, cognitive, psychosocial history related to current functional performance   ? Occupational performance deficits (Please refer to evaluation for details): ADL's;IADL's;Rest and Sleep;Leisure;Social Participation;Work   ? Rehab Potential Fair   ? Clinical Decision Making Several treatment options, min-mod task modification necessary   ? Comorbidities Affecting Occupational Performance: May have comorbidities impacting occupational performance   ? Modification or Assistance to Complete Evaluation  Min-Moderate modification of tasks or assist with assess necessary to complete eval   ? OT Frequency 2x /  week   ? OT Duration 12 weeks   ? OT Treatment/Interventions Self-care/ADL training;DME and/or AE instruction;Manual lymph drainage;Compression bandaging;Therapeutic activities;Coping strategies training;Therapeu

## 2022-05-10 NOTE — Patient Instructions (Signed)

## 2022-05-11 NOTE — Progress Notes (Signed)
Rabanal, Demitra C. ([id number]) ?Visit Report for 05/09/2022 ?Physician Orders Details ?Patient Name: [name], [name] ?Date of Service: 05/09/2022 3:30 PM ?Medical Record Number: [id number] ?Patient Account Number: [id number] ?Date of Birth/Sex: 11-04-1961 (61 y.o. F) ?Treating RN: Carlene Coria ?Primary Care Provider: CardJenny Reichmann Other Clinician: ?Referring Provider: Card, John ?Treating Provider/Extender: Jeri Cos ?Weeks in Treatment: 29 ?Verbal / Phone Orders: No ?Diagnosis Coding ?Follow-up Appointments ?o Return Appointment in 1 week. ?o Nurse Visit as needed - nurse visit on tuesday and thursday ?Home Health ?[place] ?o [place] for wound care. May utilize formulary equivalent dressing for wound treatment orders unless ?otherwise specified. Home Health Nurse may visit PRN to address patientos wound care needs. - 3 times per week ?[facility] fax [phone] ?Bathing/ Shower/ Hygiene ?o May shower; gently cleanse wound with antibacterial soap, rinse and pat dry prior to dressing wounds ?o No tub bath. ?Anesthetic (Use 'Patient Medications' Section for Anesthetic Order Entry) ?o Lidocaine applied to wound bed ?Edema Control - Lymphedema / Segmental Compressive Device / Other ?o Elevate, Exercise Daily and Avoid Standing for Long Periods of Time. ?o Elevate legs to the level of the heart and pump ankles as often as possible ?o Elevate leg(s) parallel to the floor when sitting. ?Additional Orders / Instructions ?o Follow Nutritious Diet and Increase Protein Intake ?Wound Treatment ?Wound #1 - Back Wound Laterality: Midline, Distal ?Cleanser: Normal Saline 1 x Per Day/30 Days ?Discharge Instructions: Wash your hands with soap and water. Remove old dressing, discard into plastic bag and place into trash. ?Cleanse the wound with Normal Saline prior to applying a clean dressing using gauze sponges, not tissues or cotton balls. Do not scrub ?or use excessive force.  Pat dry using gauze sponges, not tissue or cotton balls. ?Primary Dressing: Hydrofera Blue Rope 1 x Per Day/30 Days ?Discharge Instructions: cut into fourths; angle the end ?Secondary Dressing: (SILICONE BORDER) Zetuvit Plus SILICONE BORDER Dressing 4x4 (in/in) 1 x Per Day/30 Days ?Electronic Signature(s) ?Signed: 05/10/2022 8:37:54 AM By: Carlene Coria RN ?Signed: 05/10/2022 6:04:02 PM By: Worthy Keeler PA-C ?Entered By: Carlene Coria on 05/10/2022 08:37:54 ?[name], [name] ([id number]) ?-------------------------------------------------------------------------------- ?SuperBill Details ?Patient Name: [name], [name] ?Date of Service: 05/09/2022 ?Medical Record Number: [id number] ?Patient Account Number: [id number] ?Date of Birth/Sex: [birth year] (61 y.o. 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

## 2022-05-14 ENCOUNTER — Ambulatory Visit: Payer: 59 | Admitting: Occupational Therapy

## 2022-05-14 ENCOUNTER — Encounter: Payer: 59 | Admitting: Physician Assistant

## 2022-05-14 DIAGNOSIS — T8131XA Disruption of external operation (surgical) wound, not elsewhere classified, initial encounter: Secondary | ICD-10-CM | POA: Diagnosis not present

## 2022-05-14 DIAGNOSIS — I890 Lymphedema, not elsewhere classified: Secondary | ICD-10-CM

## 2022-05-14 NOTE — Progress Notes (Addendum)
Mazzie, Calah C. ([id number]) ?Visit Report for 05/14/2022 ?Arrival Information Details ?Patient Name: LYRIQUE, HAKIM ?Date of Service: 05/14/2022 11:00 AM ?Medical Record Number: [id number] ?Patient Account Number: [id number] ?Date of Birth/Sex: 1961/03/29 (62 y.o. F) ?Treating RN: Yevonne Pax ?Primary Care Britanee Vanblarcom: CardJonny Ruiz Other Clinician: ?Referring Antonin Meininger: Card, John ?Treating Enedelia Martorelli/Extender: Allen Derry ?Weeks in Treatment: 30 ?Visit Information History Since Last Visit ?All ordered tests and consults were completed: No ?Patient Arrived: [name] ?Added or deleted any medications: No ?Arrival Time: 11:06 ?Any new allergies or adverse reactions: No ?Accompanied By: self ?Had a fall or experienced change in No ?Transfer Assistance: None ?activities of daily living that may affect ?Patient Identification Verified: Yes ?risk of falls: ?Secondary Verification Process Completed: Yes ?Signs or symptoms of abuse/neglect since last visito No ?Patient Requires Transmission-Based Precautions: No ?Hospitalized since last visit: No ?Patient Has Alerts: No ?Implantable device outside of the clinic excluding No ?cellular tissue based products placed in the center ?since last visit: ?Has Dressing in Place as Prescribed: Yes ?Pain Present Now: Yes ?Electronic Signature(s) ?Signed: 05/15/2022 8:19:52 AM By: Yevonne Pax RN ?Entered By: Yevonne Pax on 05/14/2022 11:06:57 ?Peloquin, Ginia C. ([id number]) ?-------------------------------------------------------------------------------- ?Clinic Level of Care Assessment Details ?Patient Name: LERLINE, VALDIVIA ?Date of Service: 05/14/2022 11:00 AM ?Medical Record Number: [id number] ?Patient Account Number: [id number] ?Date of Birth/Sex: 1961-04-16 (61 y.o. F) ?Treating RN: Yevonne Pax ?Primary Care [name] [name]: CardJonny Ruiz Other Clinician: ?Referring Taequan Stockhausen: Card, John ?Treating Creg Gilmer/Extender: Allen Derry ?Weeks in Treatment: 30 ?Clinic Level of Care Assessment Items ?TOOL  4 Quantity Score ?X - Use when only an EandM is performed on FOLLOW-UP visit 1 0 ?ASSESSMENTS - Nursing Assessment / Reassessment ?X - Reassessment of Co-morbidities (includes updates in patient status) 1 10 ?X- 1 5 ?Reassessment of Adherence to Treatment Plan ?ASSESSMENTS - Wound and Skin Assessment / Reassessment ?X - Simple Wound Assessment / Reassessment - one wound 1 5 ?[]  - 0 ?Complex Wound Assessment / Reassessment - multiple wounds ?[]  - 0 ?Dermatologic / Skin Assessment (not related to wound area) ?ASSESSMENTS - Focused Assessment ?[]  - Circumferential Edema Measurements - multi extremities 0 ?[]  - 0 ?Nutritional Assessment / Counseling / Intervention ?[]  - 0 ?Lower Extremity Assessment (monofilament, tuning fork, pulses) ?[]  - 0 ?Peripheral Arterial Disease Assessment (using hand held doppler) ?ASSESSMENTS - Ostomy and/or Continence Assessment and Care ?[]  - Incontinence Assessment and Management 0 ?[]  - 0 ?Ostomy Care Assessment and Management (repouching, etc.) ?PROCESS - Coordination of Care ?X - Simple Patient / Family Education for ongoing care 1 15 ?[]  - 0 ?Complex (extensive) Patient / Family Education for ongoing care ?[]  - 0 ?Staff obtains Consents, Records, Test Results / Process Orders ?[]  - 0 ?Staff telephones HHA, Nursing Homes / Clarify orders / etc ?[]  - 0 ?Routine Transfer to another Facility (non-emergent condition) ?[]  - 0 ?Routine Hospital Admission (non-emergent condition) ?[]  - 0 ?New Admissions / / Ordering NPWT, Apligraf, etc. ?[]  - 0 ?Emergency Hospital Admission (emergent condition) ?X- 1 10 ?Simple Discharge Coordination ?[]  - 0 ?Complex (extensive) Discharge Coordination ?PROCESS - Special Needs ?[]  - Pediatric / Minor Patient Management 0 ?[]  - 0 ?Isolation Patient Management ?[]  - 0 ?Hearing / Language / Visual special needs ?[]  - 0 ?Assessment of Community assistance (transportation, D/C planning, etc.) ?[]  - 0 ?Additional assistance / Altered  mentation ?[]  - 0 ?Support Surface(s) Assessment (bed, cushion, seat, etc.) ?INTERVENTIONS - Wound Cleansing / Measurement ?Rolin, Shanna C. ( ) ?X- 1 5 ?Simple Wound Cleansing -  one wound ?[]  - 0 ?Complex Wound Cleansing - multiple wounds ?X- 1 5 ?Wound Imaging (photographs - any number of wounds) ?[]  - 0 ?Wound Tracing (instead of photographs) ?X- 1 5 ?Simple Wound Measurement - one wound ?[]  - 0 ?Complex Wound Measurement - multiple wounds ?INTERVENTIONS - Wound Dressings ?X - Small Wound Dressing one or multiple wounds 1 10 ?[]  - 0 ?Medium Wound Dressing one or multiple wounds ?[]  - 0 ?Large Wound Dressing one or multiple wounds ?[]  - 0 ?Application of Medications - topical ?[]  - 0 ?Application of Medications - injection ?INTERVENTIONS - Miscellaneous ?[]  - External ear exam 0 ?[]  - 0 ?Specimen Collection (cultures, biopsies, blood, body fluids, etc.) ?[]  - 0 ?Specimen(s) / Culture(s) sent or taken to Lab for analysis ?[]  - 0 ?Patient Transfer (multiple staff / / Similar devices) ?[]  - 0 ?Simple Staple / Suture removal (25 or less) ?[]  - 0 ?Complex Staple / Suture removal (26 or more) ?[]  - 0 ?Hypo / Hyperglycemic Management (close monitor of Blood Glucose) ?[]  - 0 ?Ankle / Brachial Index (ABI) - do not check if billed separately ?X- 1 5 ?Vital Signs ?Has the patient been seen at the hospital within the last three years: Yes ?Total Score: 75 ?Level Of Care: New/Established - Level ?2 ?Electronic Signature(s) ?Signed: 05/15/2022 8:19:52 AM By: RN ?Entered By: on 05/14/2022 11:42:08 ?Ugarte, Danett C. ( ) ?-------------------------------------------------------------------------------- ?Encounter Discharge Information Details ?Patient Name: MONIFA, BLANCHETTE ?Date of Service: 05/14/2022 11:00 AM ?Medical Record Number: ?Patient Account Number: ?Date of Birth/Sex: 05-15-1961 (61 y.o. F) ?Treating RN: ?Primary Care Averiana Clouatre:  Card Other Clinician: ?Referring Brandye Inthavong: Card, John ?Treating Zared Knoth/Extender: ?Weeks in Treatment: 30 ?Encounter Discharge Information Items ?Discharge Condition: Stable ?Ambulatory Status: ?Discharge Destination: Home ?Transportation: Private Auto ?Accompanied By: self ?Schedule Follow-up Appointment: Yes ?Clinical Summary of Care: Patient Declined ?Electronic Signature(s) ?Signed: 05/15/2022 8:19:52 AM By: Yevonne Pax RN ?Entered By: Yevonne Pax on 05/14/2022 11:43:18 ?Simoneaux, Viviana C. ([id number]) ?-------------------------------------------------------------------------------- ?Lower Extremity Assessment Details ?Patient Name: MALKIA, NIPPERT ?Date of Service: 05/14/2022 11:00 AM ?Medical Record Number: [id number] ?Patient Account Number: [id number] ?Date of Birth/Sex: 1961-08-23 (61 y.o. F) ?Treating RN: Yevonne Pax ?Primary Care Hattie Aguinaldo: CardJonny Ruiz Other Clinician: ?Referring Baudelia Schroepfer: Card, John ?Treating Emilea Goga/Extender: Allen Derry ?Weeks in Treatment: 30 ?Electronic Signature(s) ?Signed: 05/15/2022 8:19:52 AM By: 05/17/2022 RN ?Entered By: Yevonne Pax on 05/14/2022 11:08:52 ?Freels, Tahtiana C. (05/16/2022) ?-------------------------------------------------------------------------------- ?Multi Wound Chart Details ?Patient Name: JAYDAH, STAHLE ?Date of Service: 05/14/2022 11:00 AM ?Medical Record Number: 05/16/2022 ?Patient Account Number: [id number] ?Date of Birth/Sex: March 28, 1961 (61 y.o. F) ?Treating RN: (67 ?Primary Care Foster Sonnier: CardYevonne Pax Other Clinician: ?Referring Love Milbourne: Card, John ?Treating Adabelle Griffiths/Extender: Jonny Ruiz ?Weeks in Treatment: 30 ?Vital Signs ?Height(in): ?Pulse(bpm): 71 ?Weight(lbs): 275 ?Blood Pressure(mmHg): 112/77 ?Body Mass Index(BMI): ?Temperature(??F): 98.2 ?Respiratory Rate(breaths/min): 18 ?Photos: [N/A:N/A] ?Wound Location: Distal, Midline Back N/A N/A ?Wounding Event: Surgical Injury N/A N/A ?Primary Etiology: Dehisced  Wound N/A N/A ?Comorbid History: Hypertension N/A N/A ?Date Acquired: 04/11/2021 N/A N/A ?Weeks of Treatment: 30 N/A N/A ?Wound Status: Open N/A N/A ?Wound Recurrence: No N/A N/A ?Measurements L x W x D (cm) 0.4x

## 2022-05-14 NOTE — Progress Notes (Addendum)
Zinn, Adam C. ([id number]) ?Visit Report for 05/14/2022 ?Chief Complaint Document Details ?Patient Name: [name], [name] ?Date of Service: 05/14/2022 11:00 AM ?Medical Record Number: [id number] ?Patient Account Number: [id number] ?Date of Birth/Sex: 09-30-1961 (61 y.o. F) ?Treating RN: Yevonne Pax ?Primary Care Provider: CardJonny Ruiz Other Clinician: ?Referring Provider: Card, John ?Treating Provider/Extender: Allen Derry ?Weeks in Treatment: 30 ?Information Obtained from: Patient ?Chief Complaint ?Surgical Back Ulcer ?Electronic Signature(s) ?Signed: 05/14/2022 11:01:53 AM By: [name] PA-C ?Entered By: [name] on 05/14/2022 11:01:53 ?Hippler, Gweneth C. ([id number]) ?-------------------------------------------------------------------------------- ?HPI Details ?Patient Name: [name], [name] ?Date of Service: 05/14/2022 11:00 AM ?Medical Record Number: [id number] ?Patient Account Number: [id number] ?Date of Birth/Sex: 09-06-1961 (61 y.o. F) ?Treating RN: Yevonne Pax ?Primary Care Provider: CardJonny Ruiz Other Clinician: ?Referring Provider: Card, John ?Treating Provider/Extender: Allen Derry ?Weeks in Treatment: 30 ?History of Present Illness ?HPI Description: 10/15/2021 upon evaluation today patient presents for initial evaluation here in the clinic concerning a surgical ?ulceration/dehiscence in the lumbar spine region following surgery that she had over the past year. This was actually broken up into 3 separate ?surgical events. The initial surgical intervention actually was on November 05, 2021 almost a year ago. Subsequently the patient went back in ?February for a seroma of the area which unfortunately required her to have a repeat surgery to go in and clean this out. And then again this ?occurred in April where she went back in and again they felt like stitches were coming out and there was an additional seroma. She was placed in ?a wound VAC initially and then subsequently as it got smaller that  was discontinued. Again right now I will see anything that I think a wound VAC ?would help with. Nonetheless she definitely has a significant depth to the wound that is going require packing. I actually believe the Hydrofera ?Blue rope would probably do quite well with this the problem is as much as it is draining she probably needs this to be changed at least every day. ?She does not really have anyone that can help with that that is the complicating scenario here. With that being said the patient does have a ?history of multiple sclerosis, hypertension, and again this surgical wound dehiscence in regard to her lumbar spine region. She did have a repeat ?MRI which was actually completed 10/09/2021. This showed that there was no significant change in the subcutaneous fluid collection/track of the ?lower lumbar region. This is extending to the level of the fascia unfortunately. This seems to go all the way from the L2 level with a track ?extending all the way to the fascia at the L4-5 level. Again this is a significant wound and there is significant drainage but does not seem to ?communicate to the spinal region as far as spinal fluid or otherwise is concerned that is good news. Nonetheless she last saw Dr. Franky Macho who is ?her neurosurgeon on 10/01/2021 that was when he ordered this last MRI she supposed to see him next week as well. With that being said he did ?not feel like there was any significant issue there but was not sure why this was not healing that is when he ordered the MRI. They were wanting ?to make sure that this was packed appropriately by home health unfortunately the main issue currently is that home health is completely out of ?the picture as the patient has exhausted all the home health that that she gets for a year. She is now in a very difficult  predicament where she ?does not have anyone to help her change the dressing and to be honest that she is not able to do it herself with the location of  the wound being ?on the midline lumbar spine region. If she does not have anyone that can help it is probably can to be necessary for her to go to a facility for ?rehab and daily dressing changes as I feel like daily changes which is much drainage that she is having is going to be necessary. ?10/23/2021 upon evaluation today patient appears to be doing decently well in regard to her back ulcer. This does seem to be draining a lot less ?than what it is been doing in the past. With that being said she still has quite a bit of drainage nonetheless. I do think that given time this should ?improve least I hope so. The good news is she does have home health coming out 3 days a week were doing it 2 days a week and she is paying ?someone we can to help. ?10/30/2021 upon evaluation today patient appears to be doing okay in regard to her back ulcer this is not draining quite as bad as it was in the ?beginning but he still has quite a bit of drainage noted. I do believe that the patient would benefit from [place] going ahead forward with attempting a ?wound VAC using the Hydrofera Blue rope to pack with and then subsequently using the VAC externally to actually suction out and help this to fill- ?in. I think this is our ideal way to try to get things cleared at this point. As it stands I am not certain that we are really making a progress that we ?want to see near with doing it in the way we are which is packing with the rope. It is a good dressing but I do think it is insufficient for total ?healing. She just seems to have too much in the way of drainage at this point unfortunately. ?11/06/2021 upon evaluation today patient unfortunately continues to have issues with her back ongoing. The good news is her MRI that was ?repeated showed signs of the size of this area in the lumbar spine region having decreased from 4 cm to 3.5 cm this is definitely not bad news at ?all. With that being said unfortunately she continues to have issues  with ongoing drainage not as severe as in the beginning but nonetheless still ?significant. I do think a wound VAC still would be a good way to go although her home health agency nurse apparently has some concerns about ?the possibility of not being able to keep a seal with this as they had struggles in the past. Nonetheless I explained to the patient that this is much ?different than what she had previous and that I really feel like it would do much better as far as getting the area taken care of without having any ?complications or issues here. I think that we should be able to maintain a seal. Nonetheless at this time I did discuss with the patient as well that ?she probably does need to have a wound VAC in order for [place] to get this moving in the right direction. ?11/13/2021 upon evaluation patient's wound bed actually showed signs of significant drainage at this time. She did see the surgeon yesterday he ?did not see anything that appeared to be infected. Nonetheless he does appear that she is continuing to have areas here that just do not seem  to ?want to seal up there MRI findings have been negative but nonetheless she continues to have is the seroma that is filling in. I do feel like we need ?to try to widen the hole so we can get at least a half of the [facility] then this will be better than nothing at this point. ?11/20/2021 upon evaluation today patient actually appears to be having less pain at this point which is good news and overall she we still do not ?have the results of the culture back yet it had to be sent out to [facility] and we do not have the result back yet. Is doing decently well in regard to ?her wound. Fortunately there does not appear to be any signs of active infection systemically nor locally at this time. ?11/27/2021 upon evaluation today patient appears to be doing well with regard to her wound all things considered there does appear to be less ?drainage than there was previous.  Fortunately I do not see any evidence of worsening of the patient is stating that she is having some issues with ?back pain. This is somewhat new. Again this I think could be related to the fact that she is

## 2022-05-14 NOTE — Therapy (Deleted)
[facility] MAIN [facility] SERVICES [address] [place], [place], [address] Phone: [phone]   Fax:  [phone]  Occupational Therapy Treatment  Patient Details  Name: JONIAH NAMETH MRN: [id number] Date of Birth: 1961/03/23 Referring Provider (OT): Aggie Cosier Arringtonm FNP   Encounter Date: 05/14/2022   OT End of Session - 05/14/22 1403     Visit Number 18    Number of Visits 36    Date for OT Re-Evaluation 05/15/22    OT Start Time 0100    OT Stop Time 0205    OT Time Calculation (min) 65 min    Activity Tolerance Patient tolerated treatment well;No increased pain    Behavior During Therapy WFL for tasks assessed/performed             Past Medical History:  Diagnosis Date   Arthritis    Back pain    Chronic knee pain    Edema of both lower extremities    High blood pressure    High cholesterol    Joint pain    Multiple sclerosis (HCC)    Neuromuscular disorder (HCC)    Multiple Sclerosis over 20 years   Obesity    Vitamin D deficiency     Past Surgical History:  Procedure Laterality Date   APPENDECTOMY     CESAREAN SECTION     HAND SURGERY     lumbar back surgery     LUMBAR WOUND DEBRIDEMENT N/A 12/05/2020   Procedure: Lumbar Wound Exploration;  Surgeon: Coletta Memos, MD;  Location: [facility] OR;  Service: Neurosurgery;  Laterality: N/A;  3C/RM 21   LUMBAR WOUND DEBRIDEMENT N/A 02/21/2021   Procedure: Lumbar wound exploration;  Surgeon: Coletta Memos, MD;  Location: [facility] OR;  Service: Neurosurgery;  Laterality: N/A;  posterior   LUMBAR WOUND DEBRIDEMENT N/A 04/11/2021   Procedure: Wound Exploration with Wound Vac Placement;  Surgeon: Coletta Memos, MD;  Location: [facility] OR;  Service: Neurosurgery;  Laterality: N/A;   ROTATOR CUFF REPAIR     TONSILLECTOMY      There were no vitals filed for this visit.   Subjective Assessment - 05/14/22 1405     Subjective  Markita C Pottle returns to OT for treatment visit to address BLE  lymphedema. Pt denies LE related pain in her legs.Pt confirms new R compression stocking works well and is comfortable. She wrore it all weekend .    Pertinent History B knee OA, HTN, Obesity, MS, Spondylolisthesis-lumbar, open wound at lumbar surgical site since 4/22.    Limitations difficulty walking, impaired transfers, chronic OA pain in bilateral knees, non healing post surgical lumbar wound, BLE muscle weakness 2/2 MS, chronic leg swelling with minimal pain,    Special Tests - Stemmer bilaterally; Intake FOTO score =31/100    Patient Stated Goals get my legs better so I can do more    Currently in Pain? No/denies    Pain Onset Other (comment)   6 mnths ago without precipitating event. No family hx   Pain Onset Other (comment)   years                         OT Treatments/Exercises (OP) - 05/14/22 1406       ADLs   ADL Education Given Yes      Manual Therapy   Manual Therapy Edema management;Manual Lymphatic Drainage (MLD);Compression Bandaging    Manual Lymphatic Drainage (MLD) MLD to LLE/ LLQ utilizing diaphragmatic breathing to  access deep abdominal pathways, short neck sequence and inguinal LN, then proovided MLD from proximal to distal addressing thigh, leg and foot. Foinished up session with short neck x 3 and deep breathing.    Compression Bandaging LLE multilayer compression  wraps frome base of toes to popliteal using gradient techniques                    OT Education - 05/14/22 1407     Education Details Continued skilled Pt/caregiver education  And LE ADL training throughout visit for lymphedema self care/ home program, including compression wrapping, compression garment and device wear/care, lymphatic pumping ther ex, simple self-MLD, and skin care. Discussed initial volumetric measurements. Discussed all long term goals.    Person(s) Educated Patient    Methods Explanation;Demonstration;Handout    Comprehension Verbalized  understanding;Returned demonstration;Need further instruction                 OT Long Term Goals - 04/16/22 1359       OT LONG TERM GOAL #1   Title Given this patient's Intake score 31 /100 on the functional outcomes FOTO tool, patient will experience an increase in function of 5 points to improve basic and instrumental ADLs performance, including lymphedema self-care.    Baseline 31/100    Period Weeks    Status Partially Met   TBA next visit   Target Date 05/16/22      OT LONG TERM GOAL #2   Title Pt will demonstrate understanding of lymphedema prevention strategies by identifying and discussing 5 precautions using printed reference (modified assistance) to reduce risk of progression and to limit infection risk.    Baseline Max A    Time 4    Period Days    Status Achieved    Target Date --   4th OT Rx visit     OT LONG TERM GOAL #3   Title With Maximum caregiver assistance Pt will be able to apply multilayer, LUE compression wraps using gradient techniques to decrease limb volume, to limit infection risk, and to limit lymphedema progression. Pt unable to wrap legs independently. Trained caregiver must assist during visit intervals for CDT to be effective.    Baseline dependent - Max caregiver assistance is necessary to complete Intensive Phase of Complete Decongestive Therapy and Lymphedema self-care home program  as Pt is unable to reach his feet to apply compression wraps and garments, to inspect skin, to groom nails to perform skin care and inspection and to perform simple self-MLD. Pt agrees to arrange for consistent caregiver prior to commencing CDT.    Time 4    Period Days    Status Achieved    Target Date --   4th OT Rx visit     OT LONG TERM GOAL #4   Title With Maximum caregiver assistance between OT sessions Pt will achieve at least a 10% limb volume reduction below the knee bilaterally to return limb to more normal size and shape, to limit infection risk, to  decrease pain, to improve function, and to limit lymphedema progression.   Pt has achieved 8% voume reduction to date , which is excellent progress towards goals.   Baseline dependent    Time 12    Period Weeks    Status Partially Met   achieved for RLE below the knee   Target Date 05/16/22      OT LONG TERM GOAL #5   Title With caregiver assistance  Pt will achieve and  sustain a least 85% compliance with all 4 LE self-care home program components throughout Intensive Phase CDT, including modified simple self-MLD, daily skin inspection and care, lymphatic pumping the ex, 23/7 compression wraps to optimize limb volume reductions, to limit lymphedema progression and to limit further functional decline.    Baseline Dependent    Time 12    Period Weeks    Status Partially Met    Target Date 05/16/22                   Plan - 05/14/22 1403     Clinical Impression Statement R custom stocking containing leg swelling and is comfortable. Pt is able to don and doff independently. Pt tolerating CDT to LLE thus far without difficulty. Continued MLD and simultaneous skin care to LLE/ LLQ as established. Reapplied compression wraps to LLE as established. Cont as epr POC.    OT Occupational Profile and History Detailed Assessment- Review of Records and additional review of physical, cognitive, psychosocial history related to current functional performance    Occupational performance deficits (Please refer to evaluation for details): ADL's;IADL's;Rest and Sleep;Leisure;Social Participation;Work    Psychiatric nurse Several treatment options, min-mod task modification necessary    Comorbidities Affecting Occupational Performance: May have comorbidities impacting occupational performance    Modification or Assistance to Complete Evaluation  Min-Moderate modification of tasks or assist with assess necessary to complete eval    OT Frequency 2x / week    OT Duration 12  weeks    OT Treatment/Interventions Self-care/ADL training;DME and/or AE instruction;Manual lymph drainage;Compression bandaging;Therapeutic activities;Coping strategies training;Therapeutic exercise;Other (comment);Manual Therapy;Energy conservation;Patient/family education   skin care, Flexitouch trial, fit with appropriate compression garments that are comfortable, effective and that Pt is able to don and doff using assistive devices PRN   Recommended Other Services " Calcium channel blockers may not directly impair lymphatic function. However our results show that a reduced lymphatic function predisposes to CBC edema, which may explain why some patients develop edema during treatment." [employer], Niklas Telinius, Harlow Ohms, and Vibeke Hjortdal.  Reduced Lymphatic Function Predisposes to Calcium Channel Blocker Edema: A Randomized Placebo-Controlled Clinical Trial.  Lymphatic Research and Biology.Apr [email or web address]://[email or web address]/[email or web address]  Published in Volume: 18 Issue 2: April 16, 2019  Online Ahead of Print:August 18, 2018    Consulted and Agree with Plan of Care Patient             Patient will benefit from skilled therapeutic intervention in order to improve the following deficits and impairments:           Visit Diagnosis: Lymphedema, not elsewhere classified    Problem List Patient Active Problem List   Diagnosis Date Noted   Postoperative seroma of subcutaneous tissue after non-dermatologic procedure 04/11/2021   Wound drainage 02/21/2021   Wound dehiscence 02/21/2021   Postoperative seroma of musculoskeletal structure after musculoskeletal procedure 12/05/2020   Spondylolisthesis of lumbar region 11/02/2020   Insulin resistance 02/15/2020   Class 3 severe obesity with serious comorbidity and body mass index (BMI) of 45.0 to 49.9 in adult (HCC) 01/19/2020   Vitamin D deficiency 01/18/2020   Other hyperlipidemia 02/22/2019   Primary osteoarthritis  of both knees 10/14/2018   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 [place], [place], [address] Phone: [phone]  Fax:  [phone]  Name: BRITENY MEHUS MRN: [id number] Date of Birth: [birth year]

## 2022-05-14 NOTE — Therapy (Signed)
[facility] ?[place], [name], [address] ?Phone: [phone]   Fax:  [phone] ? ?Occupational Therapy Treatment ? ?Patient Details  ?Name: [name] ?MRN: [id number] ?Date of Birth: 08-30-1961 ?Referring Provider (OT): Crystal Arringtonm FNP ? ? ?Encounter Date: 05/14/2022 ? ? OT End of Session - 05/14/22 1403   ? ? Visit Number 18   ? Number of Visits 36   ? Date for OT Re-Evaluation 08/12/22   ? OT Start Time 0100   ? OT Stop Time 0205   ? OT Time Calculation (min) 65 min   ? Activity Tolerance Patient tolerated treatment well;No increased pain   ? Behavior During Therapy [facility] for tasks assessed/performed   ? ?  ?  ? ?  ? ? ?Past Medical History:  ?Diagnosis Date  ? Arthritis   ? Back pain   ? Chronic knee pain   ? Edema of both lower extremities   ? High blood pressure   ? High cholesterol   ? Joint pain   ? Multiple sclerosis ([place])   ? Neuromuscular disorder ([place])   ? Multiple Sclerosis over 20 years  ? Obesity   ? Vitamin D deficiency   ? ? ?Past Surgical History:  ?Procedure Laterality Date  ? APPENDECTOMY    ? CESAREAN SECTION    ? HAND SURGERY    ? lumbar back surgery    ? LUMBAR WOUND DEBRIDEMENT N/A 12/05/2020  ? Procedure: Lumbar Wound Exploration;  Surgeon: Ashok Pall, MD;  Location: [facility];  Service: Neurosurgery;  Laterality: N/A;  3C/RM 21  ? LUMBAR WOUND DEBRIDEMENT N/A 02/21/2021  ? Procedure: Lumbar wound exploration;  Surgeon: Ashok Pall, MD;  Location: [facility];  Service: Neurosurgery;  Laterality: N/A;  posterior  ? LUMBAR WOUND DEBRIDEMENT N/A 04/11/2021  ? Procedure: Wound Exploration with Wound Vac Placement;  Surgeon: Ashok Pall, MD;  Location: [facility];  Service: Neurosurgery;  Laterality: N/A;  ? ROTATOR CUFF REPAIR    ? TONSILLECTOMY    ? ? ?There were no vitals filed for this visit. ? ? Subjective Assessment - 05/14/22 1405   ? ? Subjective  [name] returns to OT for treatment visit to address BLE  lymphedema. Pt denies LE related pain in her legs.Pt confirms new R compression stocking works well and is comfortable. She wrore it all weekend .   ? Pertinent History B knee OA, HTN, Obesity, MS, Spondylolisthesis-lumbar, open wound at lumbar surgical site since 4/22.   ? Limitations difficulty walking, impaired transfers, chronic OA pain in bilateral knees, non healing post surgical lumbar wound, BLE muscle weakness 2/2 MS, chronic leg swelling with minimal pain,   ? Special Tests - Stemmer bilaterally; Intake FOTO score =31/100   ? Patient Stated Goals get my legs better so I can do more   ? Currently in Pain? No/denies   ? Pain Onset Other (comment)   6 mnths ago without precipitating event. No family hx  ? Pain Onset Other (comment)   years  ? ?  ?  ? ?  ? ? ? ? ? ? ? ? ? ? ? ? ? ? ? OT Treatments/Exercises (OP) - 05/14/22 1406   ? ?  ? ADLs  ? ADL Education Given Yes   ?  ? Manual Therapy  ? Manual Therapy Edema management;Manual Lymphatic Drainage (MLD);Compression Bandaging   ? Manual Lymphatic Drainage (MLD) MLD to LLE/ LLQ utilizing diaphragmatic breathing to  access deep abdominal pathways, short neck sequence and inguinal LN, then proovided MLD from proximal to distal addressing thigh, leg and foot. Foinished up session with short neck x 3 and deep breathing.   ? Compression Bandaging LLE multilayer compression  wraps frome base of toes to popliteal using gradient techniques   ? ?  ?  ? ?  ? ? ? ? ? ? ? ? ? OT Education - 05/14/22 1407   ? ? Education Details Continued skilled Pt/caregiver education  And LE ADL training throughout visit for lymphedema self care/ home program, including compression wrapping, compression garment and device wear/care, lymphatic pumping ther ex, simple self-MLD, and skin care. Discussed initial volumetric measurements. Discussed all long term goals.   ? Person(s) Educated Patient   ? Methods Explanation;Demonstration;Handout   ? Comprehension Verbalized  understanding;Returned demonstration;Need further instruction   ? ?  ?  ? ?  ? ? ? ? ? ? OT Long Term Goals - 04/16/22 1359   ? ?  ? OT LONG TERM GOAL #1  ? Title Given this patient?s Intake score 31 /100 on the functional outcomes FOTO tool, patient will experience an increase in function of 5 points to improve basic and instrumental ADLs performance, including lymphedema self-care.   ? Baseline 31/100   ? Period Weeks   ? Status Partially Met   TBA next visit  ? Target Date 05/16/22   ?  ? OT LONG TERM GOAL #2  ? Title Pt will demonstrate understanding of lymphedema prevention strategies by identifying and discussing 5 precautions using printed reference (modified assistance) to reduce risk of progression and to limit infection risk.   ? Baseline Max A   ? Time 4   ? Period Days   ? Status Achieved   ? Target Date --   4th OT Rx visit  ?  ? OT LONG TERM GOAL #3  ? Title With Maximum caregiver assistance Pt will be able to apply multilayer, LUE compression wraps using gradient techniques to decrease limb volume, to limit infection risk, and to limit lymphedema progression. Pt unable to wrap legs independently. Trained caregiver must assist during visit intervals for CDT to be effective.   ? Baseline dependent - Max caregiver assistance is necessary to complete Intensive Phase of Complete Decongestive Therapy and Lymphedema self-care home program  as Pt is unable to reach his feet to apply compression wraps and garments, to inspect skin, to groom nails to perform skin care and inspection and to perform simple self-MLD. Pt agrees to arrange for consistent caregiver prior to commencing CDT.   ? Time 4   ? Period Days   ? Status Achieved   ? Target Date --   4th OT Rx visit  ?  ? OT LONG TERM GOAL #4  ? Title With Maximum caregiver assistance between OT sessions Pt will achieve at least a 10% limb volume reduction below the knee bilaterally to return limb to more normal size and shape, to limit infection risk, to  decrease pain, to improve function, and to limit lymphedema progression.   Pt has achieved 8% voume reduction to date , which is excellent progress towards goals.  ? Baseline dependent   ? Time 12   ? Period Weeks   ? Status Partially Met   achieved for RLE below the knee  ? Target Date 05/16/22   ?  ? OT LONG TERM GOAL #5  ? Title With caregiver assistance  Pt will achieve and  sustain a least 85% compliance with all 4 LE self-care home program components throughout Intensive Phase CDT, including modified simple self-MLD, daily skin inspection and care, lymphatic pumping the ex, 23/7 compression wraps to optimize limb volume reductions, to limit lymphedema progression and to limit further functional decline.   ? Baseline Dependent   ? Time 12   ? Period Weeks   ? Status Partially Met   ? Target Date 05/16/22   ? ?  ?  ? ?  ? ? ? ? ? ? ? ? Plan - 05/14/22 1403   ? ? Clinical Impression Statement R custom stocking containing leg swelling and is comfortable. Pt is able to don and doff independently. Pt tolerating CDT to LLE thus far without difficulty. Continued MLD and simultaneous skin care to LLE/ LLQ as established. Reapplied compression wraps to LLE as established. Cont as epr POC.   ? OT Occupational Profile and History Detailed Assessment- Review of Records and additional review of physical, cognitive, psychosocial history related to current functional performance   ? Occupational performance deficits (Please refer to evaluation for details): ADL's;IADL's;Rest and Sleep;Leisure;Social Participation;Work   ? Rehab Potential Fair   ? Clinical Decision Making Several treatment options, min-mod task modification necessary   ? Comorbidities Affecting Occupational Performance: May have comorbidities impacting occupational performance   ? Modification or Assistance to Complete Evaluation  Min-Moderate modification of tasks or assist with assess necessary to complete eval   ? OT Frequency 2x / week   ? OT Duration 12  weeks   ? OT Treatment/Interventions Self-care/ADL training;DME and/or AE instruction;Manual lymph drainage;Compression bandaging;Therapeutic activities;Coping strategies training;Therapeutic exercise;Other (comment);Manual Therapy

## 2022-05-14 NOTE — Patient Instructions (Signed)

## 2022-05-15 ENCOUNTER — Encounter: Payer: 59 | Admitting: Occupational Therapy

## 2022-05-16 DIAGNOSIS — T8131XA Disruption of external operation (surgical) wound, not elsewhere classified, initial encounter: Secondary | ICD-10-CM | POA: Diagnosis not present

## 2022-05-17 ENCOUNTER — Ambulatory Visit: Payer: 59 | Admitting: Occupational Therapy

## 2022-05-17 DIAGNOSIS — I890 Lymphedema, not elsewhere classified: Secondary | ICD-10-CM | POA: Diagnosis not present

## 2022-05-17 NOTE — Therapy (Deleted)
[place] [facility] [address] [place], [name], [address] Phone: [phone]   Fax:  [phone]  Occupational Therapy Treatment  Patient Details  Name: [name] MRN: [id number] Date of Birth: [birth year] Referring Provider (OT): Donella Stade Arringtonm FNP   Encounter Date: 05/17/2022   OT End of Session - 05/17/22 1112     Visit Number 19    Number of Visits 36    Date for OT Re-Evaluation 08/12/22    OT Start Time 1103    OT Stop Time 1203    OT Time Calculation (min) 60 min    Activity Tolerance Patient tolerated treatment well;No increased pain    Behavior During Therapy WFL for tasks assessed/performed             Past Medical History:  Diagnosis Date   Arthritis    Back pain    Chronic knee pain    Edema of both lower extremities    High blood pressure    High cholesterol    Joint pain    Multiple sclerosis ([place])    Neuromuscular disorder ([facility])    Multiple Sclerosis over 20 years   Obesity    Vitamin D deficiency     Past Surgical History:  Procedure Laterality Date   APPENDECTOMY     CESAREAN SECTION     HAND SURGERY     lumbar back surgery     LUMBAR WOUND DEBRIDEMENT N/A 12/05/2020   Procedure: Lumbar Wound Exploration;  Surgeon: Ashok Pall, MD;  Location: [facility];  Service: Neurosurgery;  Laterality: N/A;  3C/RM 21   LUMBAR WOUND DEBRIDEMENT N/A 02/21/2021   Procedure: Lumbar wound exploration;  Surgeon: Ashok Pall, MD;  Location: [facility];  Service: Neurosurgery;  Laterality: N/A;  posterior   LUMBAR WOUND DEBRIDEMENT N/A 04/11/2021   Procedure: Wound Exploration with Wound Vac Placement;  Surgeon: Ashok Pall, MD;  Location: [facility];  Service: Neurosurgery;  Laterality: N/A;   ROTATOR CUFF REPAIR     TONSILLECTOMY      There were no vitals filed for this visit.   Subjective Assessment - 05/17/22 1113     Subjective  [name] returns to OT for treatment visit to address BLE  lymphedema. Pt denies LE related pain in her legs.Pt confirms new R garment continues to work well. She reports she washed it for the first time without issues.    Pertinent History B knee OA, HTN, Obesity, MS, Spondylolisthesis-lumbar, open wound at lumbar surgical site since 4/22.    Limitations difficulty walking, impaired transfers, chronic OA pain in bilateral knees, non healing post surgical lumbar wound, BLE muscle weakness 2/2 MS, chronic leg swelling with minimal pain,    Special Tests - Stemmer bilaterally; Intake FOTO score =31/100    Patient Stated Goals get my legs better so I can do more    Currently in Pain? No/denies    Pain Onset Other (comment)   6 mnths ago without precipitating event. No family hx   Pain Onset Other (comment)   years                         OT Treatments/Exercises (OP) - 05/17/22 1115       ADLs   ADL Education Given Yes      Manual Therapy   Manual Therapy Edema management;Manual Lymphatic Drainage (MLD);Compression Bandaging    Manual Lymphatic Drainage (MLD) MLD to LLE/ LLQ utilizing  diaphragmatic breathing to access deep abdominal pathways, short neck sequence and inguinal LN, then proovided MLD from proximal to distal addressing thigh, leg and foot. Foinished up session with short neck x 3 and deep breathing.    Compression Bandaging LLE multilayer compression  wraps frome base of toes to popliteal using gradient techniques                    OT Education - 05/17/22 1210     Education Details Continued skilled Pt/caregiver education  And LE ADL training throughout visit for lymphedema self care/ home program, including compression wrapping, compression garment and device wear/care, lymphatic pumping ther ex, simple self-MLD, and skin care. Discussed initial volumetric measurements. Discussed all long term goals.    Person(s) Educated Patient    Methods Explanation;Demonstration;Handout    Comprehension Verbalized  understanding;Returned demonstration;Need further instruction                 OT Long Term Goals - 04/16/22 1359       OT LONG TERM GOAL #1   Title Given this patient's Intake score 31 /100 on the functional outcomes FOTO tool, patient will experience an increase in function of 5 points to improve basic and instrumental ADLs performance, including lymphedema self-care.    Baseline 31/100    Period Weeks    Status Partially Met   TBA next visit   Target Date 05/16/22      OT LONG TERM GOAL #2   Title Pt will demonstrate understanding of lymphedema prevention strategies by identifying and discussing 5 precautions using printed reference (modified assistance) to reduce risk of progression and to limit infection risk.    Baseline Max A    Time 4    Period Days    Status Achieved    Target Date --   4th OT Rx visit     OT LONG TERM GOAL #3   Title With Maximum caregiver assistance Pt will be able to apply multilayer, LUE compression wraps using gradient techniques to decrease limb volume, to limit infection risk, and to limit lymphedema progression. Pt unable to wrap legs independently. Trained caregiver must assist during visit intervals for CDT to be effective.    Baseline dependent - Max caregiver assistance is necessary to complete Intensive Phase of Complete Decongestive Therapy and Lymphedema self-care home program  as Pt is unable to reach his feet to apply compression wraps and garments, to inspect skin, to groom nails to perform skin care and inspection and to perform simple self-MLD. Pt agrees to arrange for consistent caregiver prior to commencing CDT.    Time 4    Period Days    Status Achieved    Target Date --   4th OT Rx visit     OT LONG TERM GOAL #4   Title With Maximum caregiver assistance between OT sessions Pt will achieve at least a 10% limb volume reduction below the knee bilaterally to return limb to more normal size and shape, to limit infection risk, to  decrease pain, to improve function, and to limit lymphedema progression.   Pt has achieved 8% voume reduction to date , which is excellent progress towards goals.   Baseline dependent    Time 12    Period Weeks    Status Partially Met   achieved for RLE below the knee   Target Date 05/16/22      OT LONG TERM GOAL #5   Title With caregiver assistance  Pt  will achieve and sustain a least 85% compliance with all 4 LE self-care home program components throughout Intensive Phase CDT, including modified simple self-MLD, daily skin inspection and care, lymphatic pumping the ex, 23/7 compression wraps to optimize limb volume reductions, to limit lymphedema progression and to limit further functional decline.    Baseline Dependent    Time 12    Period Weeks    Status Partially Met    Target Date 05/16/22                   Plan - 05/17/22 1210     Clinical Impression Statement Continued MLD and simultaneous skin care to LLE/ LLQ as established. Reapplied compression wraps as established. Pt cont to make steady progress towards all OT goals for LE care. Pt demonstrates excellent compliance with R custom compression garment. Cont as  POC.    OT Occupational Profile and History Detailed Assessment- Review of Records and additional review of physical, cognitive, psychosocial history related to current functional performance    Occupational performance deficits (Please refer to evaluation for details): ADL's;IADL's;Rest and Sleep;Leisure;Social Participation;Work    Writer Several treatment options, min-mod task modification necessary    Comorbidities Affecting Occupational Performance: May have comorbidities impacting occupational performance    Modification or Assistance to Complete Evaluation  Min-Moderate modification of tasks or assist with assess necessary to complete eval    OT Frequency 2x / week    OT Duration 12 weeks    OT  Treatment/Interventions Self-care/ADL training;DME and/or AE instruction;Manual lymph drainage;Compression bandaging;Therapeutic activities;Coping strategies training;Therapeutic exercise;Other (comment);Manual Therapy;Energy conservation;Patient/family education   skin care, Flexitouch trial, fit with appropriate compression garments that are comfortable, effective and that Pt is able to don and doff using assistive devices PRN   Recommended Other Services " Calcium channel blockers may not directly impair lymphatic function. However our results show that a reduced lymphatic function predisposes to CBC edema, which may explain why some patients develop edema during treatment." [employer], Niklas Telinius, Claiborne Billings, and Vibeke Hjortdal.  Reduced Lymphatic Function Predisposes to Calcium Channel Blocker Edema: A Randomized Placebo-Controlled Clinical Trial.  Lymphatic Research and Biology.Apr [email or web address]://[email or web address]/[email or web address]  Published in Volume: 18 Issue 2: April 16, 2019  Online Ahead of Print:August 18, 2018    Consulted and Agree with Plan of Care Patient             Patient will benefit from skilled therapeutic intervention in order to improve the following deficits and impairments:           Visit Diagnosis: Lymphedema, not elsewhere classified    Problem List Patient Active Problem List   Diagnosis Date Noted   Postoperative seroma of subcutaneous tissue after non-dermatologic procedure 04/11/2021   Wound drainage 02/21/2021   Wound dehiscence 02/21/2021   Postoperative seroma of musculoskeletal structure after musculoskeletal procedure 12/05/2020   Spondylolisthesis of lumbar region 11/02/2020   Insulin resistance 02/15/2020   Class 3 severe obesity with serious comorbidity and body mass index (BMI) of 45.0 to 49.9 in adult ([place]) 01/19/2020   Vitamin D deficiency 01/18/2020   Other hyperlipidemia 02/22/2019   Primary osteoarthritis of both  knees 10/14/2018   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 EXERCISE: Perform lymphatic pumping there ex at least 2 x a day while wearing your compression wraps or garments. Perform 10 reps of each  exercise bilaterally and be sure to perform them in order. Don't skip around!  OMIT PARTIAL SIT UPs.  2. MLD: Perform simple self-manual lymphatic drainage (MLD) at least once a day as directed. Take your time! Breathe! ;-)  3. If you have a Flexitouch advanced "pump" use it 1 time each day on a single limb only. The Flexitouch moves lymphatic fluid out of your affected body part and back to your heart, so DO NOT use the Flexi on 2 legs at a time, and DO NOT ues it on 2 legs on the same day. If you experience any atypical shortness of breath, sudden onset of pain, or feelings of heart arhythmia, or racing, discontinue use of the Flexitouch and report these symptoms to your doctor right away. Also, discontinue Flexi if you have an infection or a fever. It's OK to resume using the device 72 hours AFTER your first dose of oral antibiotic.   4. 4. WRAPS: Compression wraps are to be worn 23 hrs/ 7 days/wk during Intensive Phase of Complete Decongestive Therapy (CDT).Building tolerance may take time and practice, so don't get discouraged. If bandages begin to feel tight during periods of inactivity and/or during the night, try performing your exercises to loosen them. Do not leave short stretch wraps in place for > 23 hours. It is very important that you remove all wraps daily to inspect skin, bathe and perform skin care before reapplying your wraps.  5. Daytime GARMENTS/ HOS DEVICES: During the Self-Management Phase CDT your compression garments are to be worn during waking hours when active. Do NOT sleep in your garments!!  Don daytime garments first thing in the morning. Do not wear your HOS devices all day  instead of garments. These will not contain your swelling.  6. PUT YOUR FEET UP! Elevate your feet and legs and feet to the level of your heart whenever you are sitting down.   7. SKIN: Carefully monitor skin condition and perform impeccable hygiene daily. Bathe skin with mild soap and water and apply low pH lotion (aka Eucerin ) to improve hydration and limit infection risk.  [place] [facility] SERVICES [address] [place], [name], [address] Phone: [phone]   Fax:  [phone]  Name: [name] MRN: [id number] Date of Birth: Feb 17, 1961

## 2022-05-17 NOTE — Therapy (Signed)
[place] MAIN [facility] SERVICES [address] [place], [name], [address] Phone: [phone]   Fax:  [phone]  Occupational Therapy Treatment  Patient Details  Name: [name] MRN: [id number] Date of Birth: Dec 26, 1961 Referring Provider (OT): Donella Stade Arringtonm FNP   Encounter Date: 05/17/2022   OT End of Session - 05/17/22 1112     Visit Number 19    Number of Visits 36    Date for OT Re-Evaluation 08/12/22    OT Start Time 1103    OT Stop Time 1203    OT Time Calculation (min) 60 min    Activity Tolerance Patient tolerated treatment well;No increased pain    Behavior During Therapy WFL for tasks assessed/performed             Past Medical History:  Diagnosis Date   Arthritis    Back pain    Chronic knee pain    Edema of both lower extremities    High blood pressure    High cholesterol    Joint pain    Multiple sclerosis ([place])    Neuromuscular disorder (HCC)    Multiple Sclerosis over 20 years   Obesity    Vitamin D deficiency     Past Surgical History:  Procedure Laterality Date   APPENDECTOMY     CESAREAN SECTION     HAND SURGERY     lumbar back surgery     LUMBAR WOUND DEBRIDEMENT N/A 12/05/2020   Procedure: Lumbar Wound Exploration;  Surgeon: Ashok Pall, MD;  Location: [place];  Service: Neurosurgery;  Laterality: N/A;  3C/RM 21   LUMBAR WOUND DEBRIDEMENT N/A 02/21/2021   Procedure: Lumbar wound exploration;  Surgeon: Ashok Pall, MD;  Location: [place];  Service: Neurosurgery;  Laterality: N/A;  posterior   LUMBAR WOUND DEBRIDEMENT N/A 04/11/2021   Procedure: Wound Exploration with Wound Vac Placement;  Surgeon: Ashok Pall, MD;  Location: [place];  Service: Neurosurgery;  Laterality: N/A;   ROTATOR CUFF REPAIR     TONSILLECTOMY      There were no vitals filed for this visit.   Subjective Assessment - 05/17/22 1113     Subjective  [name] returns to OT for treatment visit to address BLE  lymphedema. Pt denies LE related pain in her legs.Pt confirms new R garment continues to work well. She reports she washed it for the first time without issues.    Pertinent History B knee OA, HTN, Obesity, MS, Spondylolisthesis-lumbar, open wound at lumbar surgical site since 4/22.    Limitations difficulty walking, impaired transfers, chronic OA pain in bilateral knees, non healing post surgical lumbar wound, BLE muscle weakness 2/2 MS, chronic leg swelling with minimal pain,    Special Tests - Stemmer bilaterally; Intake FOTO score =31/100    Patient Stated Goals get my legs better so I can do more    Currently in Pain? No/denies    Pain Onset Other (comment)   6 mnths ago without precipitating event. No family hx   Pain Onset Other (comment)   years                         OT Treatments/Exercises (OP) - 05/17/22 1115       ADLs   ADL Education Given Yes      Manual Therapy   Manual Therapy Edema management;Manual Lymphatic Drainage (MLD);Compression Bandaging    Manual Lymphatic Drainage (MLD) MLD to LLE/ LLQ utilizing  diaphragmatic breathing to access deep abdominal pathways, short neck sequence and inguinal LN, then proovided MLD from proximal to distal addressing thigh, leg and foot. Foinished up session with short neck x 3 and deep breathing.    Compression Bandaging LLE multilayer compression  wraps frome base of toes to popliteal using gradient techniques                    OT Education - 05/17/22 1210     Education Details Continued skilled Pt/caregiver education  And LE ADL training throughout visit for lymphedema self care/ home program, including compression wrapping, compression garment and device wear/care, lymphatic pumping ther ex, simple self-MLD, and skin care. Discussed initial volumetric measurements. Discussed all long term goals.    Person(s) Educated Patient    Methods Explanation;Demonstration;Handout    Comprehension Verbalized  understanding;Returned demonstration;Need further instruction                 OT Long Term Goals - 04/16/22 1359       OT LONG TERM GOAL #1   Title Given this patient's Intake score 31 /100 on the functional outcomes FOTO tool, patient will experience an increase in function of 5 points to improve basic and instrumental ADLs performance, including lymphedema self-care.    Baseline 31/100    Period Weeks    Status Partially Met   TBA next visit   Target Date 05/16/22      OT LONG TERM GOAL #2   Title Pt will demonstrate understanding of lymphedema prevention strategies by identifying and discussing 5 precautions using printed reference (modified assistance) to reduce risk of progression and to limit infection risk.    Baseline Max A    Time 4    Period Days    Status Achieved    Target Date --   4th OT Rx visit     OT LONG TERM GOAL #3   Title With Maximum caregiver assistance Pt will be able to apply multilayer, LUE compression wraps using gradient techniques to decrease limb volume, to limit infection risk, and to limit lymphedema progression. Pt unable to wrap legs independently. Trained caregiver must assist during visit intervals for CDT to be effective.    Baseline dependent - Max caregiver assistance is necessary to complete Intensive Phase of Complete Decongestive Therapy and Lymphedema self-care home program  as Pt is unable to reach his feet to apply compression wraps and garments, to inspect skin, to groom nails to perform skin care and inspection and to perform simple self-MLD. Pt agrees to arrange for consistent caregiver prior to commencing CDT.    Time 4    Period Days    Status Achieved    Target Date --   4th OT Rx visit     OT LONG TERM GOAL #4   Title With Maximum caregiver assistance between OT sessions Pt will achieve at least a 10% limb volume reduction below the knee bilaterally to return limb to more normal size and shape, to limit infection risk, to  decrease pain, to improve function, and to limit lymphedema progression.   Pt has achieved 8% voume reduction to date , which is excellent progress towards goals.   Baseline dependent    Time 12    Period Weeks    Status Partially Met   achieved for RLE below the knee   Target Date 05/16/22      OT LONG TERM GOAL #5   Title With caregiver assistance  Pt  will achieve and sustain a least 85% compliance with all 4 LE self-care home program components throughout Intensive Phase CDT, including modified simple self-MLD, daily skin inspection and care, lymphatic pumping the ex, 23/7 compression wraps to optimize limb volume reductions, to limit lymphedema progression and to limit further functional decline.    Baseline Dependent    Time 12    Period Weeks    Status Partially Met    Target Date 05/16/22                   Plan - 05/17/22 1210     Clinical Impression Statement Continued MLD and simultaneous skin care to LLE/ LLQ as established. Reapplied compression wraps as established. Pt cont to make steady progress towards all OT goals for LE care. Pt demonstrates excellent compliance with R custom compression garment. Cont as  POC.    OT Occupational Profile and History Detailed Assessment- Review of Records and additional review of physical, cognitive, psychosocial history related to current functional performance    Occupational performance deficits (Please refer to evaluation for details): ADL's;IADL's;Rest and Sleep;Leisure;Social Participation;Work    Writer Several treatment options, min-mod task modification necessary    Comorbidities Affecting Occupational Performance: May have comorbidities impacting occupational performance    Modification or Assistance to Complete Evaluation  Min-Moderate modification of tasks or assist with assess necessary to complete eval    OT Frequency 2x / week    OT Duration 12 weeks    OT  Treatment/Interventions Self-care/ADL training;DME and/or AE instruction;Manual lymph drainage;Compression bandaging;Therapeutic activities;Coping strategies training;Therapeutic exercise;Other (comment);Manual Therapy;Energy conservation;Patient/family education   skin care, Flexitouch trial, fit with appropriate compression garments that are comfortable, effective and that Pt is able to don and doff using assistive devices PRN   Recommended Other Services " Calcium channel blockers may not directly impair lymphatic function. However our results show that a reduced lymphatic function predisposes to CBC edema, which may explain why some patients develop edema during treatment." [employer], Niklas Telinius, Claiborne Billings, and Vibeke Hjortdal.  Reduced Lymphatic Function Predisposes to Calcium Channel Blocker Edema: A Randomized Placebo-Controlled Clinical Trial.  Lymphatic Research and Biology.Apr [email or web address]://doi.org/10.1089/lrb.[email or web address]  Published in Volume: 18 Issue 2: April 16, 2019  Online Ahead of Print:August 18, 2018    Consulted and Agree with Plan of Care Patient             Patient will benefit from skilled therapeutic intervention in order to improve the following deficits and impairments:           Visit Diagnosis: Lymphedema, not elsewhere classified    Problem List Patient Active Problem List   Diagnosis Date Noted   Postoperative seroma of subcutaneous tissue after non-dermatologic procedure 04/11/2021   Wound drainage 02/21/2021   Wound dehiscence 02/21/2021   Postoperative seroma of musculoskeletal structure after musculoskeletal procedure 12/05/2020   Spondylolisthesis of lumbar region 11/02/2020   Insulin resistance 02/15/2020   Class 3 severe obesity with serious comorbidity and body mass index (BMI) of 45.0 to 49.9 in adult ([place]) 01/19/2020   Vitamin D deficiency 01/18/2020   Other hyperlipidemia 02/22/2019   Primary osteoarthritis of both  knees 10/14/2018   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

## 2022-05-17 NOTE — Patient Instructions (Signed)

## 2022-05-18 NOTE — Progress Notes (Signed)
[name] ([id number]) Visit Report for 05/16/2022 Arrival Information Details Patient Name: [name], [name] Date of Service: 05/16/2022 3:30 PM Medical Record Number: [id number] Patient Account Number: [id number] Date of Birth/Sex: 01-Nov-1961 (61 y.o. F) Treating RN: Yevonne Pax Primary Care Tanish Sinkler: Card, Jonny Ruiz Other Clinician: Referring Maahi Lannan: Card, John Treating Cherokee Clowers/Extender: Rowan Blase in Treatment: 30 Visit Information History Since Last Visit All ordered tests and consults were completed: No Patient Arrived: [name] Added or deleted any medications: No Arrival Time: 15:30 Any new allergies or adverse reactions: No Accompanied By: self Had a fall or experienced change in No Transfer Assistance: None activities of daily living that may affect Patient Identification Verified: Yes risk of falls: Secondary Verification Process Completed: Yes Signs or symptoms of abuse/neglect since last visito No Patient Requires Transmission-Based Precautions: No Hospitalized since last visit: No Patient Has Alerts: No Implantable device outside of the clinic excluding No cellular tissue based products placed in the center since last visit: Has Dressing in Place as Prescribed: Yes Pain Present Now: Yes Electronic Signature(s) Signed: 05/17/2022 9:52:34 AM By: Yevonne Pax RN Entered By: Yevonne Pax on 05/17/2022 09:52:34 Leandro, Marysol CMarland Kitchen ([id number]) -------------------------------------------------------------------------------- Encounter Discharge Information Details Patient Name: Knack, Kenitha C. Date of Service: 05/16/2022 3:30 PM Medical Record Number: [id number] Patient Account Number: [id number] Date of Birth/Sex: 1961/10/30 (60 y.o. F) Treating RN: Yevonne Pax Primary Care Juelz Whittenberg: [name] Other Clinician: Referring Knolan Simien: Card, John Treating Gertie Broerman/Extender: Rowan Blase in Treatment: 30 Encounter Discharge Information  Items Discharge Condition: Stable Ambulatory Status: Walker Discharge Destination: Home Transportation: Private Auto Accompanied By: self Schedule Follow-up Appointment: Yes Clinical Summary of Care: Patient Declined Electronic Signature(s) Signed: 05/17/2022 9:54:13 AM By: Yevonne Pax RN Entered By: Yevonne Pax on 05/17/2022 09:54:13 Ureste, Laylana C. ([id number]) -------------------------------------------------------------------------------- Wound Assessment Details Patient Name: Dehaas, Kayliee C. Date of Service: 05/16/2022 3:30 PM Medical Record Number: [id number] Patient Account Number: [id number] Date of Birth/Sex: Feb 26, 1961 (61 y.o. F) Treating RN: Yevonne Pax Primary Care Zidan Helget: Card, Jonny Ruiz Other Clinician: Referring Kanton Kamel: Card, John Treating Adhrit Krenz/Extender: Rowan Blase in Treatment: 30 Wound Status Wound Number: 1 Primary Etiology: Dehisced Wound Wound Location: Distal, Midline Back Wound Status: Open Wounding Event: Surgical Injury Comorbid History: Hypertension Date Acquired: 04/11/2021 Weeks Of Treatment: 30 Clustered Wound: No Wound Measurements Length: (cm) 0.4 Width: (cm) 0.3 Depth: (cm) 2.5 Area: (cm) 0.094 Volume: (cm) 0.236 % Reduction in Area: 25.4% % Reduction in Volume: 59.2% Epithelialization: None Tunneling: No Undermining: Yes Starting Position (o'clock): 9 Ending Position (o'clock): 3 Maximum Distance: (cm) 3.5 Wound Description Classification: Full Thickness Without Exposed Support Structu Exudate Amount: Medium Exudate Type: Serous Exudate Color: amber res Foul Odor After Cleansing: No Slough/Fibrino No Wound Bed Granulation Amount: Large (67-100%) Exposed Structure Granulation Quality: Red Fascia Exposed: No Necrotic Amount: None Present (0%) Fat Layer (Subcutaneous Tissue) Exposed: Yes Tendon Exposed: No Muscle Exposed: No Joint Exposed: No Bone Exposed: No Treatment Notes Wound #1 (Back) Wound  Laterality: Midline, Distal Cleanser Normal Saline Discharge Instruction: Wash your hands with soap and water. Remove old dressing, discard into plastic bag and place into trash. Cleanse the wound with Normal Saline prior to applying a clean dressing using gauze sponges, not tissues or cotton balls. Do not scrub or use excessive force. Pat dry using gauze sponges, not tissue or cotton balls. Peri-Wound Care Topical Primary Dressing Hydrofera Blue Rope Discharge Instruction: cut into fourths; angle the end Secondary Dressing (SILICONE BORDER) Zetuvit Plus SILICONE BORDER Dressing 4x4 (in/in) Secured With Krigbaum,  Tea C. ([id number]) Compression Wrap Compression Stockings Add-Ons Electronic Signature(s) Signed: 05/17/2022 9:53:22 AM By: Yevonne Pax RN Entered By: Yevonne Pax on 05/17/2022 09:53:21

## 2022-05-18 NOTE — Progress Notes (Signed)
ZAVANNAH, DEBLOIS ([id number]) Visit Report for 05/16/2022 Physician Orders Details Patient Name: [name], [name] Date of Service: 05/16/2022 3:30 PM Medical Record Number: [id number] Patient Account Number: [id number] Date of Birth/Sex: 28-Nov-1961 (61 y.o. F) Treating RN: Yevonne Pax Primary Care Provider: [name] Other Clinician: Referring Provider: Card, John Treating Provider/Extender: Rowan Blase in Treatment: 30 Verbal / Phone Orders: No Diagnosis Coding Follow-up Appointments o Return Appointment in 1 week. o Nurse Visit as needed - nurse visit on tuesday and thursday Home Health o Home Health Company: [facility] o [facility] for wound care. May utilize formulary equivalent dressing for wound treatment orders unless otherwise specified. Home Health Nurse may visit PRN to address patientos wound care needs. - 3 times per week [facility] fax [phone] Bathing/ Shower/ Hygiene o May shower; gently cleanse wound with antibacterial soap, rinse and pat dry prior to dressing wounds o No tub bath. Anesthetic (Use 'Patient Medications' Section for Anesthetic Order Entry) o Lidocaine applied to wound bed Edema Control - Lymphedema / Segmental Compressive Device / Other o Elevate, Exercise Daily and Avoid Standing for Long Periods of Time. o Elevate legs to the level of the heart and pump ankles as often as possible o Elevate leg(s) parallel to the floor when sitting. Additional Orders / Instructions o Follow Nutritious Diet and Increase Protein Intake Wound Treatment Wound #1 - Back Wound Laterality: Midline, Distal Cleanser: Normal Saline 1 x Per Day/30 Days Discharge Instructions: Wash your hands with soap and water. Remove old dressing, discard into plastic bag and place into trash. Cleanse the wound with Normal Saline prior to applying a clean dressing using gauze sponges, not tissues or cotton balls. Do not scrub or use excessive force.  Pat dry using gauze sponges, not tissue or cotton balls. Primary Dressing: Hydrofera Blue Rope 1 x Per Day/30 Days Discharge Instructions: cut into fourths; angle the end Secondary Dressing: (SILICONE BORDER) Zetuvit Plus SILICONE BORDER Dressing 4x4 (in/in) 1 x Per Day/30 Days Electronic Signature(s) Signed: 05/17/2022 9:53:46 AM By: Yevonne Pax RN Signed: 05/17/2022 1:47:08 PM By: [name] PA-C Entered By: Yevonne Pax on 05/17/2022 09:53:46

## 2022-05-20 ENCOUNTER — Ambulatory Visit: Payer: 59 | Admitting: Occupational Therapy

## 2022-05-20 DIAGNOSIS — I890 Lymphedema, not elsewhere classified: Secondary | ICD-10-CM

## 2022-05-20 NOTE — Therapy (Signed)
[place] MAIN [facility] SERVICES [address] [place], [name], [address] Phone: [phone]   Fax:  [phone]  Occupational Therapy Treatment Note and Progress Report: Lymphedema Care  Patient Details  Name: [name] MRN: [id number] Date of Birth: Aug 22, 1961 Referring Provider (OT): Donella Stade Arringtonm FNP Reporting Period: 04/12/22 - 05/20/22  Encounter Date: 05/20/2022   OT End of Session - 05/20/22 1302     Visit Number 20    Number of Visits 36    Date for OT Re-Evaluation 08/12/22    OT Start Time 0102    OT Stop Time 0205    OT Time Calculation (min) 63 min    Activity Tolerance Patient tolerated treatment well;No increased pain    Behavior During Therapy WFL for tasks assessed/performed             Past Medical History:  Diagnosis Date   Arthritis    Back pain    Chronic knee pain    Edema of both lower extremities    High blood pressure    High cholesterol    Joint pain    Multiple sclerosis ([place])    Neuromuscular disorder (HCC)    Multiple Sclerosis over 20 years   Obesity    Vitamin D deficiency     Past Surgical History:  Procedure Laterality Date   APPENDECTOMY     CESAREAN SECTION     HAND SURGERY     lumbar back surgery     LUMBAR WOUND DEBRIDEMENT N/A 12/05/2020   Procedure: Lumbar Wound Exploration;  Surgeon: Ashok Pall, MD;  Location: [place];  Service: Neurosurgery;  Laterality: N/A;  3C/RM 21   LUMBAR WOUND DEBRIDEMENT N/A 02/21/2021   Procedure: Lumbar wound exploration;  Surgeon: Ashok Pall, MD;  Location: [place];  Service: Neurosurgery;  Laterality: N/A;  posterior   LUMBAR WOUND DEBRIDEMENT N/A 04/11/2021   Procedure: Wound Exploration with Wound Vac Placement;  Surgeon: Ashok Pall, MD;  Location: [place];  Service: Neurosurgery;  Laterality: N/A;   ROTATOR CUFF REPAIR     TONSILLECTOMY      There were no vitals filed for this visit.   Subjective Assessment - 05/20/22 1418      Subjective  [name] presents for OT Rx visit 20 to address treatment of BLE lymphedema. Pt denies LE related pain in her legs.Pt presents   in manual transport w/c w custom compression garment in place on the RLE, and multilayer compression wraps on the LLE. Pt has no new concerns by report.    Pertinent History B knee OA, HTN, Obesity, MS, Spondylolisthesis-lumbar, open wound at lumbar surgical site since 4/22.    Limitations difficulty walking, impaired transfers, chronic OA pain in bilateral knees, non healing post surgical lumbar wound, BLE muscle weakness 2/2 MS, chronic leg swelling with minimal pain,    Special Tests - Stemmer bilaterally; Intake FOTO score =31/100    Patient Stated Goals get my legs better so I can do more    Currently in Pain? No/denies    Pain Onset Other (comment)   6 mnths ago without precipitating event. No family hx   Pain Onset Other (comment)   years                LYMPHEDEMA/ONCOLOGY QUESTIONNAIRE - 05/20/22 1421       Left Lower Extremity Lymphedema   Other Correction for 05/07/22: LLE limb volume is increased by 10.5% that visit, not 23%. LLE A-D  volume on 05/07/22 measured 8121.29 ml. Today's LLE volume = 6975.0 ml.    Other LLE limb volume is DECreased 5.4% since initially commencing CDT on 03/05/22. RLE is decreased overall by 8.5% to date.                     OT Treatments/Exercises (OP) - 05/20/22 1420       ADLs   ADL Education Given Yes      Manual Therapy   Manual Therapy Edema management;Manual Lymphatic Drainage (MLD);Compression Bandaging    Edema Management LLE comparative limb volumetrics    Manual Lymphatic Drainage (MLD) MLD to LLE/ LLQ utilizing diaphragmatic breathing to access deep abdominal pathways, short neck sequence and inguinal LN, then proovided MLD from proximal to distal addressing thigh, leg and foot. Foinished up session with short neck x 3 and deep breathing.    Compression Bandaging LLE multilayer  compression  wraps frome base of toes to popliteal using gradient techniques                    OT Education - 05/20/22 1430     Education Details Continued Pt/ CG edu for lymphedema self care home program throughout session. Topics include multilayer compression wrapping, simple self-MLD, therapeutic lymphatic pumping exercises, skin/nail care, infection and progression risk reduction factors (LE precautions) , compression garment recommendations and specifications, wear and care schedule and compression garment donning / doffing w assistive devices. Discussed progress towards all OT goals since commencing CDT. All questions answered to the Pt's satisfaction. Good return.    Person(s) Educated Patient    Methods Explanation;Demonstration;Handout    Comprehension Verbalized understanding;Returned demonstration;Need further instruction                 OT Long Term Goals - 05/20/22 1312       OT LONG TERM GOAL #1   Title Given this patient's Intake score 31 /100 on the functional outcomes FOTO tool, patient will experience an increase in function of 5 points to improve basic and instrumental ADLs performance, including lymphedema self-care.    Baseline 31/100    Period Weeks    Status On-going     Target Date 08/18/22      OT LONG TERM GOAL #2   Title Pt will demonstrate understanding of lymphedema prevention strategies by identifying and discussing 5 precautions using printed reference (modified assistance) to reduce risk of progression and to limit infection risk.    Baseline Max A    Time 4    Period Days    Status Achieved    Target Date --      OT LONG TERM GOAL #3   Title With Maximum caregiver assistance Pt will be able to apply multilayer, LUE compression wraps using gradient techniques to decrease limb volume, to limit infection risk, and to limit lymphedema progression. Pt unable to wrap legs independently. Trained caregiver must assist during visit intervals  for CDT to be effective.    Baseline dependent - Max caregiver assistance is necessary to complete Intensive Phase of Complete Decongestive Therapy and Lymphedema self-care home program  as Pt is unable to reach his feet to apply compression wraps and garments, to inspect skin, to groom nails to perform skin care and inspection and to perform simple self-MLD. Pt agrees to arrange for consistent caregiver prior to commencing CDT.    Time 4    Period Days    Status Achieved   Met and exceeded. Pt  is able to apply BLE multilayer compression wraps using correct gradient techniques with modified independence ( extra time).   Target Date --   4th OT Rx visit     OT LONG TERM GOAL #4   Title With Maximum caregiver assistance between OT sessions Pt will achieve at least a 10% limb volume reduction below the knee bilaterally to return limb to more normal size and shape, to limit infection risk, to decrease pain, to improve function, and to limit lymphedema progression.   Pt has achieved 8% voume reduction to date , which is excellent progress towards goals.   Baseline dependent    Time 12    Period Weeks    Status Partially Met   RLE A-D volume is reduced by 8.5% since commencing CDT on 3/l/23. LLE is reduced in volume by 5.4% to date. Pt has achieved these reductions without CG assistance.   Target Date 08/18/22      OT LONG TERM GOAL #5   Title With caregiver assistance  Pt will achieve and sustain a least 85% compliance with all 4 LE self-care home program components throughout Intensive Phase CDT, including modified simple self-MLD, daily skin inspection and care, lymphatic pumping the ex, 23/7 compression wraps to optimize limb volume reductions, to limit lymphedema progression and to limit further functional decline.    Baseline Dependent    Time 12    Period Weeks    Status Achieved   Goal met and exceeded. Pt remains > 85% compliant with LE home program   with modified independence (extra time).    Target Date 05/16/22                   Plan - 05/20/22 1432     Clinical Impression Statement LLE comparative limb volumetrics reveal a 5.4% limb volume decrease since commencing CDT on 03/05/22. To date, RLE limb volume is reduced by 8.5%  from ankle to below kne (A-D). Pt has met, or partially met, all lymphedema self-care goals. Please refer to LONG TERM GOALS section for detailed progress report. Pt has been fitted with a custom, flat knit, knee length compression garment in the RLE (Elvarex CLASSIC, ccl 2 (23-32 mmHg), stocking with open toe, t heel, oblique top edge and silicone dotted band on top. Continued MLD and simultaneous skin care to LLE/ LLQ as established. Reapplied compression wraps as established. Cont as  POC.    OT Occupational Profile and History Detailed Assessment- Review of Records and additional review of physical, cognitive, psychosocial history related to current functional performance    Occupational performance deficits (Please refer to evaluation for details): ADL's;IADL's;Rest and Sleep;Leisure;Social Participation;Work    Writer Several treatment options, min-mod task modification necessary    Comorbidities Affecting Occupational Performance: May have comorbidities impacting occupational performance    Modification or Assistance to Complete Evaluation  Min-Moderate modification of tasks or assist with assess necessary to complete eval    OT Frequency 2x / week    OT Duration 12 weeks    OT Treatment/Interventions Self-care/ADL training;DME and/or AE instruction;Manual lymph drainage;Compression bandaging;Therapeutic activities;Coping strategies training;Therapeutic exercise;Other (comment);Manual Therapy;Energy conservation;Patient/family education   skin care, Flexitouch trial, fit with appropriate compression garments that are comfortable, effective and that Pt is able to don and doff using assistive devices PRN    Recommended Other Services " Calcium channel blockers may not directly impair lymphatic function. However our results show that a reduced lymphatic function predisposes  to CBC edema, which may explain why some patients develop edema during treatment." [employer], Niklas Telinius, Claiborne Billings, and Vibeke Hjortdal.  Reduced Lymphatic Function Predisposes to Calcium Channel Blocker Edema: A Randomized Placebo-Controlled Clinical Trial.  Lymphatic Research and Biology.Apr [email or web address]://doi.org/10.1089/lrb.[email or web address]  Published in Volume: 18 Issue 2: April 16, 2019  Online Ahead of Print:August 18, 2018    Consulted and Agree with Plan of Care Patient             Patient will benefit from skilled therapeutic intervention in order to improve the following deficits and impairments:           Visit Diagnosis: Lymphedema, not elsewhere classified    Problem List Patient Active Problem List   Diagnosis Date Noted   Postoperative seroma of subcutaneous tissue after non-dermatologic procedure 04/11/2021   Wound drainage 02/21/2021   Wound dehiscence 02/21/2021   Postoperative seroma of musculoskeletal structure after musculoskeletal procedure 12/05/2020   Spondylolisthesis of lumbar region 11/02/2020   Insulin resistance 02/15/2020   Class 3 severe obesity with serious comorbidity and body mass index (BMI) of 45.0 to 49.9 in adult ([place]) 01/19/2020   Vitamin D deficiency 01/18/2020   Other hyperlipidemia 02/22/2019   Primary osteoarthritis of both knees 10/14/2018   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   Essential hypertension 02/16/2016   Multiple sclerosis ([place]) 04/06/2013   Andrey Spearman, MS, OTR/L, CLT-LANA 05/20/22 2:47 PM   [place] MAIN [facility] SERVICES [place], [name], [address] Phone: [phone]   Fax:  [phone]  Name: [name] MRN: [id number] Date of Birth: [birth year]

## 2022-05-20 NOTE — Patient Instructions (Signed)

## 2022-05-21 ENCOUNTER — Encounter: Payer: 59 | Admitting: Physician Assistant

## 2022-05-21 DIAGNOSIS — T8131XA Disruption of external operation (surgical) wound, not elsewhere classified, initial encounter: Secondary | ICD-10-CM | POA: Diagnosis not present

## 2022-05-21 NOTE — Progress Notes (Signed)
[name], [name] ([id number]) Visit Report for 05/21/2022 Chief Complaint Document Details Patient Name: [name], [name] Date of Service: 05/21/2022 11:00 AM Medical Record Number: [id number] Patient Account Number: [id number] Date of Birth/Sex: 06/29/1961 (61 y.o. F) Treating RN: Carlene Coria Primary Care Provider: Clayborn Heron Other Clinician: Referring Provider: Card, John Treating Provider/Extender: Skipper Cliche in Treatment: 31 Information Obtained from: Patient Chief Complaint Surgical Back Ulcer Electronic Signature(s) Signed: 05/21/2022 10:59:59 AM By: Worthy Keeler PA-C Entered By: Worthy Keeler on 05/21/2022 10:59:59 [name], [name] ([id number]) -------------------------------------------------------------------------------- Problem List Details Patient Name: [name], [name] Date of Service: 05/21/2022 11:00 AM Medical Record Number: [id number] Patient Account Number: [id number] Date of Birth/Sex: 1961/03/30 (61 y.o. 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

## 2022-05-21 NOTE — Progress Notes (Signed)
MICHAELENE, DUTAN ([id number]) Visit Report for 05/21/2022 Arrival Information Details Patient Name: [name], [name] Date of Service: 05/21/2022 11:00 AM Medical Record Number: [id number] Patient Account Number: [id number] Date of Birth/Sex: [birth year] (61 y.o. F) Treating RN: Yevonne Pax Primary Care Jacilyn Sanpedro: Card, Jonny Ruiz Other Clinician: Referring Shahil Speegle: Card, John Treating Tyhesha Dutson/Extender: Rowan Blase in Treatment: 31 Visit Information History Since Last Visit All ordered tests and consults were completed: No Patient Arrived: [name] Added or deleted any medications: No Arrival Time: 11:00 Any new allergies or adverse reactions: No Accompanied By: self Had a fall or experienced change in No Transfer Assistance: None activities of daily living that may affect Patient Identification Verified: Yes risk of falls: Secondary Verification Process Completed: Yes Signs or symptoms of abuse/neglect since last visito No Patient Requires Transmission-Based Precautions: No Hospitalized since last visit: No Patient Has Alerts: No Implantable device outside of the clinic excluding No cellular tissue based products placed in the center since last visit: Has Dressing in Place as Prescribed: Yes Pain Present Now: Yes Electronic Signature(s) Unsigned Entered ByYevonne Pax on 05/21/2022 11:01:03 Signature(s): Date(s): Tout, Foye C. ([id number]) -------------------------------------------------------------------------------- Lower Extremity Assessment Details Patient Name: [name], [name] Date of Service: 05/21/2022 11:00 AM Medical Record Number: [id number] Patient Account Number: [id number] Date of Birth/Sex: 1961/07/02 (61 y.o. F) Treating RN: Yevonne Pax Primary Care Deontrey Massi: [name] Other Clinician: Referring Shaniyah Wix: Card, John Treating Reilly Blades/Extender: Rowan Blase in Treatment: 31 Electronic Signature(s) Unsigned Entered By: Yevonne Pax on  05/21/2022 11:02:55 Signature(s): Date(s): Mccurdy, Ardyth C. ([id number]) -------------------------------------------------------------------------------- Multi Wound Chart Details Patient Name: [name], [name] Date of Service: 05/21/2022 11:00 AM Medical Record Number: [id number] Patient Account Number: [id number] Date of Birth/Sex: 11/29/1961 (61 y.o. F) Treating RN: Yevonne Pax Primary Care Cambri Plourde: [name] Other Clinician: Referring Trevin Gartrell: Card, John Treating Maygen Sirico/Extender: Rowan Blase in Treatment: 31 Vital Signs Height(in): Pulse(bpm): 71 Weight(lbs): 275 Blood Pressure(mmHg): 112/77 Body Mass Index(BMI): Temperature(F): 98.2 Respiratory Rate(breaths/min): 18 Photos: [N/A:N/A] Wound Location: Distal, Midline Back N/A N/A Wounding Event: Surgical Injury N/A N/A Primary Etiology: Dehisced Wound N/A N/A Comorbid History: Hypertension N/A N/A Date Acquired: 04/11/2021 N/A N/A Weeks of Treatment: 31 N/A N/A Wound Status: Open N/A N/A Wound Recurrence: No N/A N/A Measurements L x W x D (cm) 0.5x0.3x2.6 N/A N/A Area (cm) : 0.118 N/A N/A Volume (cm) : 0.306 N/A N/A % Reduction in Area: 6.30% N/A N/A % Reduction in Volume: 47.10% N/A N/A Classification: Full Thickness Without Exposed N/A N/A Support Structures Exudate Amount: Medium N/A N/A Exudate Type: Serous N/A N/A Exudate Color: amber N/A N/A Granulation Amount: Large (67-100%) N/A N/A Granulation Quality: Red N/A N/A Necrotic Amount: None Present (0%) N/A N/A Exposed Structures: Fat Layer (Subcutaneous Tissue): N/A N/A Yes Fascia: No Tendon: No Muscle: No Joint: No Bone: No Epithelialization: None N/A N/A Treatment Notes Electronic Signature(s) Unsigned Entered ByYevonne Pax on 05/21/2022 11:03:05 Signature(s): Date(s): Zorn, Zaylin Salena Saner ([id number]) -------------------------------------------------------------------------------- Multi-Disciplinary Care Plan Details Patient  Name: [name], [name] Date of Service: 05/21/2022 11:00 AM Medical Record Number: [id number] Patient Account Number: [id number] Date of Birth/Sex: 1961/03/02 (61 y.o. F) Treating RN: Yevonne Pax Primary Care Elli Groesbeck: [name] Other Clinician: Referring Yaqub Arney: Card, John Treating Seva Chancy/Extender: Rowan Blase in Treatment: 31 Active Inactive Wound/Skin Impairment Nursing Diagnoses: Knowledge deficit related to ulceration/compromised skin integrity Goals: Patient/caregiver will verbalize understanding of skin care regimen Date Initiated: 10/15/2021 Target Resolution Date: 06/15/2022 Goal Status: Active Ulcer/skin breakdown will have a volume reduction of 30%  by week 4 Date Initiated: 10/15/2021 Date Inactivated: 01/29/2022 Target Resolution Date: 12/15/2021 Goal Status: Unmet Unmet Reason: comorbities Ulcer/skin breakdown will have a volume reduction of 50% by week 8 Date Initiated: 10/15/2021 Date Inactivated: 01/29/2022 Target Resolution Date: 01/15/2022 Goal Status: Unmet Unmet Reason: comorbities Ulcer/skin breakdown will have a volume reduction of 80% by week 12 Date Initiated: 10/15/2021 Date Inactivated: 02/19/2022 Target Resolution Date: 02/15/2022 Goal Status: Unmet Unmet Reason: comorbities Ulcer/skin breakdown will heal within 14 weeks Date Initiated: 10/15/2021 Date Inactivated: 05/07/2022 Target Resolution Date: 03/15/2022 Goal Status: Unmet Unmet Reason: comorbities Interventions: Assess patient/caregiver ability to obtain necessary supplies Assess patient/caregiver ability to perform ulcer/skin care regimen upon admission and as needed Assess ulceration(s) every visit Notes: Electronic Signature(s) Unsigned Entered By: Yevonne Pax on 05/21/2022 11:02:59 Signature(s): Date(s): Walls, Marquis C. ([id number]) -------------------------------------------------------------------------------- Pain Assessment Details Patient Name: [name], [name]  C. Date of Service: 05/21/2022 11:00 AM Medical Record Number: [id number] Patient Account Number: [id number] Date of Birth/Sex: [birth year] (61 y.o. F) Treating RN: Yevonne Pax Primary Care Alise Calais: [name] Other Clinician: Referring Zeena Starkel: Card, John Treating Tine Mabee/Extender: Rowan Blase in Treatment: 31 Active Problems Location of Pain Severity and Description of Pain Patient Has Paino Yes Site Locations With Dressing Change: Yes Duration of the Pain. Constant / Intermittento Intermittent Rate the pain. Current Pain Level: 4 Worst Pain Level: 6 Least Pain Level: 5 Tolerable Pain Level: 5 Character of Pain Describe the Pain: Aching Pain Management and Medication Current Pain Management: Medication: Yes Cold Application: No Rest: Yes Massage: No Activity: No T.E.N.S.: No Heat Application: No Leg drop or elevation: No Is the Current Pain Management Adequate: Inadequate How does your wound impact your activities of daily livingo Sleep: Yes Bathing: No Appetite: No Relationship With Others: No Bladder Continence: No Emotions: No Bowel Continence: No Work: No Toileting: No Drive: No Dressing: No Hobbies: No Electronic Signature(s) Unsigned Entered ByYevonne Pax on 05/21/2022 11:02:18 Signature(s): Date(s): [name], [name] ([id number]) -------------------------------------------------------------------------------- Wound Assessment Details Patient Name: [name], [name] Date of Service: 05/21/2022 11:00 AM Medical Record Number: [id number] Patient Account Number: [id number] Date of Birth/Sex: 1961-11-23 (61 y.o. F) Treating RN: Yevonne Pax Primary Care Kyngston Pickelsimer: Card, Jonny Ruiz Other Clinician: Referring Joie Hipps: Card, John Treating Chrishauna Mee/Extender: Rowan Blase in Treatment: 31 Wound Status Wound Number: 1 Primary Etiology: Dehisced Wound Wound Location: Distal, Midline Back Wound Status: Open Wounding Event: Surgical  Injury Comorbid History: Hypertension Date Acquired: 04/11/2021 Weeks Of Treatment: 31 Clustered Wound: No Photos Wound Measurements Length: (cm) 0.5 Width: (cm) 0.3 Depth: (cm) 2.6 Area: (cm) 0.118 Volume: (cm) 0.306 % Reduction in Area: 6.3% % Reduction in Volume: 47.1% Epithelialization: None Tunneling: No Undermining: No Wound Description Classification: Full Thickness Without Exposed Support Structu Exudate Amount: Medium Exudate Type: Serous Exudate Color: amber res Foul Odor After Cleansing: No Slough/Fibrino No Wound Bed Granulation Amount: Large (67-100%) Exposed Structure Granulation Quality: Red Fascia Exposed: No Necrotic Amount: None Present (0%) Fat Layer (Subcutaneous Tissue) Exposed: Yes Tendon Exposed: No Muscle Exposed: No Joint Exposed: No Bone Exposed: No Electronic Signature(s) Unsigned Entered ByYevonne Pax on 05/21/2022 11:02:45 Signature(s): Date(s): [name], [name] ([id number]) -------------------------------------------------------------------------------- Vitals Details Patient Name: [name], [name] Date of Service: 05/21/2022 11:00 AM Medical Record Number: [id number] Patient Account Number: [id number] Date of Birth/Sex: [birth year] (61 y.o. F) Treating RN: Yevonne Pax Primary Care Bradan Congrove: [name] Other Clinician: Referring Arthella Headings: Card, John Treating Ramon Zanders/Extender: Rowan Blase in Treatment: 31 Vital Signs Time Taken: 11:00 Temperature (F): 98.2 Weight (lbs):  275 Pulse (bpm): 71 Respiratory Rate (breaths/min): 18 Blood Pressure (mmHg): 112/77 Reference Range: 80 - 120 mg / dl Electronic Signature(s) Unsigned Entered By: Yevonne Pax on 05/21/2022 11:01:29 Signature(s): Date(s):

## 2022-05-23 ENCOUNTER — Encounter: Payer: 59 | Admitting: Occupational Therapy

## 2022-05-23 DIAGNOSIS — T8131XA Disruption of external operation (surgical) wound, not elsewhere classified, initial encounter: Secondary | ICD-10-CM | POA: Diagnosis not present

## 2022-05-23 NOTE — Progress Notes (Signed)
SIMONA, ROCQUE ([id number]) Visit Report for 05/23/2022 Arrival Information Details Patient Name: [name], [name] Date of Service: 05/23/2022 3:30 PM Medical Record Number: [id number] Patient Account Number: [id number] Date of Birth/Sex: 1961/07/02 (61 y.o. F) Treating RN: Yevonne Pax Primary Care Jerimiah Wolman: Card, Jonny Ruiz Other Clinician: Referring Kallen Mccrystal: Card, John Treating Lawyer Washabaugh/Extender: Rowan Blase in Treatment: 31 Visit Information History Since Last Visit All ordered tests and consults were completed: No Patient Arrived: [name] Added or deleted any medications: No Arrival Time: 13:30 Any new allergies or adverse reactions: No Accompanied By: self Had a fall or experienced change in No Transfer Assistance: None activities of daily living that may affect Patient Identification Verified: Yes risk of falls: Secondary Verification Process Completed: Yes Signs or symptoms of abuse/neglect since last visito No Patient Requires Transmission-Based Precautions: No Hospitalized since last visit: No Patient Has Alerts: No Implantable device outside of the clinic excluding No cellular tissue based products placed in the center since last visit: Has Dressing in Place as Prescribed: Yes Pain Present Now: No Electronic Signature(s) Signed: 05/23/2022 4:06:33 PM By: Yevonne Pax RN Entered By: Yevonne Pax on 05/23/2022 16:06:33 [name], [name] ([id number]) -------------------------------------------------------------------------------- Clinic Level of Care Assessment Details Patient Name: [name], [name] Date of Service: 05/23/2022 3:30 PM Medical Record Number: [id number] Patient Account Number: [id number] Date of Birth/Sex: Jan 09, 1961 (61 y.o. F) Treating RN: Yevonne Pax Primary Care Tamecka Milham: Card, Jonny Ruiz Other Clinician: Referring Addelynn Batte: Card, John Treating Darek Eifler/Extender: Rowan Blase in Treatment: 31 Clinic Level of Care Assessment Items TOOL 4  Quantity Score X - Use when only an EandM is performed on FOLLOW-UP visit 1 0 ASSESSMENTS - Nursing Assessment / Reassessment X - Reassessment of Co-morbidities (includes updates in patient status) 1 10 X- 1 5 Reassessment of Adherence to Treatment Plan ASSESSMENTS - Wound and Skin Assessment / Reassessment X - Simple Wound Assessment / Reassessment - one wound 1 5  - 0 Complex Wound Assessment / Reassessment - multiple wounds  - 0 Dermatologic / Skin Assessment (not related to wound area) ASSESSMENTS - Focused Assessment  - Circumferential Edema Measurements - multi extremities 0  - 0 Nutritional Assessment / Counseling / Intervention  - 0 Lower Extremity Assessment (monofilament, tuning fork, pulses)  - 0 Peripheral Arterial Disease Assessment (using hand held doppler) ASSESSMENTS - Ostomy and/or Continence Assessment and Care  - Incontinence Assessment and Management 0  - 0 Ostomy Care Assessment and Management (repouching, etc.) PROCESS - Coordination of Care X - Simple Patient / Family Education for ongoing care 1 15  - 0 Complex (extensive) Patient / Family Education for ongoing care  - 0 Staff obtains Chiropractor, Records, Test Results / Process Orders  - 0 Staff telephones HHA, Nursing Homes / Clarify orders / etc  - 0 Routine Transfer to another Facility (non-emergent condition)  - 0 Routine Hospital Admission (non-emergent condition)  - 0 New Admissions / Manufacturing engineer / Ordering NPWT, Apligraf, etc.  - 0 Emergency Hospital Admission (emergent condition) X- 1 10 Simple Discharge Coordination  - 0 Complex (extensive) Discharge Coordination PROCESS - Special Needs  - Pediatric / Minor Patient Management 0  - 0 Isolation Patient Management  - 0 Hearing / Language / Visual special needs  - 0 Assessment of Community assistance (transportation, D/[name] planning, etc.)  - 0 Additional assistance / Altered  mentation  - 0 Support Surface(s) Assessment (bed, cushion, seat, etc.) INTERVENTIONS - Wound Cleansing / Measurement [name], [name] ([id number]) X- 1 5 Simple Wound Cleansing -  one wound []  - 0 Complex Wound Cleansing - multiple wounds X- 1 5 Wound Imaging (photographs - any number of wounds) []  - 0 Wound Tracing (instead of photographs) X- 1 5 Simple Wound Measurement - one wound []  - 0 Complex Wound Measurement - multiple wounds INTERVENTIONS - Wound Dressings []  - Small Wound Dressing one or multiple wounds 0 X- 1 15 Medium Wound Dressing one or multiple wounds []  - 0 Large Wound Dressing one or multiple wounds []  - 0 Application of Medications - topical []  - 0 Application of Medications - injection INTERVENTIONS - Miscellaneous []  - External ear exam 0 []  - 0 Specimen Collection (cultures, biopsies, blood, body fluids, etc.) []  - 0 Specimen(s) / Culture(s) sent or taken to Lab for analysis []  - 0 Patient Transfer (multiple staff / / Similar devices) []  - 0 Simple Staple / Suture removal (25 or less) []  - 0 Complex Staple / Suture removal (26 or more) []  - 0 Hypo / Hyperglycemic Management (close monitor of Blood Glucose) []  - 0 Ankle / Brachial Index (ABI) - do not check if billed separately X- 1 5 Vital Signs Has the patient been seen at the hospital within the last three years: Yes Total Score: 80 Level Of Care: New/Established - Level 3 Electronic Signature(s) Unsigned Entered By on 05/23/2022 16:09:35 Signature(s): Date(s): [name], [name] ( ) -------------------------------------------------------------------------------- Encounter Discharge Information Details Patient Name: [name], [name] Date of Service: 05/23/2022 3:30 PM Medical Record Number: Patient Account Number: Date of Birth/Sex: 10-27-1961 (61 y.o. F) Treating RN: Primary Care Tranell Wojtkiewicz: Nurse, adult Other  Clinician: Referring Shelene Krage: Card, John Treating Mosi Hannold/Extender: in Treatment: 31 Encounter Discharge Information Items Discharge Condition: Stable Ambulatory Status: Walker Discharge Destination: Home Transportation: Private Auto Accompanied By: self Schedule Follow-up Appointment: Yes Clinical Summary of Care: Electronic Signature(s) Signed: 05/23/2022 4:08:47 PM By: RN Entered By: Yevonne Pax on 05/23/2022 16:08:47 [name], [name] (Marland Kitchen) -------------------------------------------------------------------------------- Wound Assessment Details Patient Name: [name], [name] Date of Service: 05/23/2022 3:30 PM Medical Record Number: 05/25/2022 Patient Account Number: [id number] Date of Birth/Sex: 11-28-1961 (61 y.o. F) Treating RN: 68 Primary Care Everline Mahaffy: Card, Yevonne Pax Other Clinician: Referring Yanitza Shvartsman: Card, John Treating Ivori Storr/Extender: [name] in Treatment: 31 Wound Status Wound Number: 1 Primary Etiology: Dehisced Wound Wound Location: Distal, Midline Back Wound Status: Open Wounding Event: Surgical Injury Comorbid History: Hypertension Date Acquired: 04/11/2021 Weeks Of Treatment: 31 Clustered Wound: No Wound Measurements Length: (cm) 0.5 Width: (cm) 0.3 Depth: (cm) 3.5 Area: (cm) 0.118 Volume: (cm) 0.412 % Reduction in Area: 6.3% % Reduction in Volume: 28.7% Epithelialization: None Tunneling: No Undermining: No Wound Description Classification: Full Thickness Without Exposed Support Structu Exudate Amount: Medium Exudate Type: Serous Exudate Color: amber res Foul Odor After Cleansing: No Slough/Fibrino No Wound Bed Granulation Amount: Large (67-100%) Exposed Structure Granulation Quality: Red Fascia Exposed: No Necrotic Amount: None Present (0%) Fat Layer (Subcutaneous Tissue) Exposed: Yes Tendon Exposed: No Muscle Exposed: No Joint Exposed: No Bone Exposed: No Treatment  Notes Wound #1 (Back) Wound Laterality: Midline, Distal Cleanser Normal Saline Discharge Instruction: Wash your hands with soap and water. Remove old dressing, discard into plastic bag and place into trash. Cleanse the wound with Normal Saline prior to applying a clean dressing using gauze sponges, not tissues or cotton balls. Do not scrub or use excessive force. Pat dry using gauze sponges, not tissue or cotton balls. Peri-Wound Care Topical Primary Dressing Hydrofera  Blue Rope Discharge Instruction: cut into fourths; angle the end Secondary Dressing (SILICONE BORDER) Zetuvit Plus SILICONE BORDER Dressing 4x4 (in/in) Secured With Compression Wrap Compression Stockings [name], [name] ([id number]) Add-Ons Electronic Signature(s) Signed: 05/23/2022 4:06:55 PM By: Yevonne Pax RN Entered By: Yevonne Pax on 05/23/2022 16:06:55

## 2022-05-24 NOTE — Progress Notes (Signed)
[name] ([id number]) Visit Report for 05/23/2022 Physician Orders Details Patient Name: [name], [name] Date of Service: 05/23/2022 3:30 PM Medical Record Number: [id number] Patient Account Number: [id number] Date of Birth/Sex: January 31, 1961 (61 y.o. F) Treating RN: Yevonne Pax Primary Care Provider: [name] Other Clinician: Referring Provider: Card, John Treating Provider/Extender: Rowan Blase in Treatment: 17 Verbal / Phone Orders: No Diagnosis Coding Follow-up Appointments o Return Appointment in 1 week. o Nurse Visit as needed - nurse visit on tuesday and thursday Home Health o Home Health Company: [facility] o [facility] for wound care. May utilize formulary equivalent dressing for wound treatment orders unless otherwise specified. Home Health Nurse may visit PRN to address patientos wound care needs. - 3 times per week [facility] fax [phone] Bathing/ Shower/ Hygiene o May shower; gently cleanse wound with antibacterial soap, rinse and pat dry prior to dressing wounds o No tub bath. Anesthetic (Use 'Patient Medications' Section for Anesthetic Order Entry) o Lidocaine applied to wound bed Edema Control - Lymphedema / Segmental Compressive Device / Other o Elevate, Exercise Daily and Avoid Standing for Long Periods of Time. o Elevate legs to the level of the heart and pump ankles as often as possible o Elevate leg(s) parallel to the floor when sitting. Additional Orders / Instructions o Follow Nutritious Diet and Increase Protein Intake Wound Treatment Wound #1 - Back Wound Laterality: Midline, Distal Cleanser: Normal Saline 1 x Per Day/30 Days Discharge Instructions: Wash your hands with soap and water. Remove old dressing, discard into plastic bag and place into trash. Cleanse the wound with Normal Saline prior to applying a clean dressing using gauze sponges, not tissues or cotton balls. Do not scrub or use excessive force.  Pat dry using gauze sponges, not tissue or cotton balls. Primary Dressing: Hydrofera Blue Rope 1 x Per Day/30 Days Discharge Instructions: cut into fourths; angle the end Secondary Dressing: (SILICONE BORDER) Zetuvit Plus SILICONE BORDER Dressing 4x4 (in/in) 1 x Per Day/30 Days Electronic Signature(s) Signed: 05/23/2022 4:07:25 PM By: Yevonne Pax RN Signed: 05/24/2022 4:54:58 PM By: [name] PA-C Entered By: Yevonne Pax on 05/23/2022 16:07:24 [name], [name] ([id number]) -------------------------------------------------------------------------------- SuperBill Details Patient Name: [name], [name] Date of Service: 05/23/2022 Medical Record Number: [id number] Patient Account Number: [id number] Date of Birth/Sex: 08-May-1961 (61 y.o. 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

## 2022-05-28 ENCOUNTER — Encounter: Payer: 59 | Admitting: Physician Assistant

## 2022-05-28 DIAGNOSIS — T8131XA Disruption of external operation (surgical) wound, not elsewhere classified, initial encounter: Secondary | ICD-10-CM | POA: Diagnosis not present

## 2022-05-28 NOTE — Progress Notes (Addendum)
MEGHAM, DWYER ([id number]) Visit Report for 05/28/2022 Chief Complaint Document Details Patient Name: [name], [name] Date of Service: 05/28/2022 11:00 AM Medical Record Number: [id number] Patient Account Number: [id number] Date of Birth/Sex: 05-Feb-1961 (61 y.o. F) Treating RN: Huel Coventry Primary Care Provider: [name] Other Clinician: Referring Provider: Card, John Treating Provider/Extender: Rowan Blase in Treatment: 32 Information Obtained from: Patient Chief Complaint Surgical Back Ulcer Electronic Signature(s) Signed: 05/28/2022 11:18:21 AM By: [name] PA-C Entered By: [name] on 05/28/2022 11:18:21 Danh, Gisele CMarland Kitchen ([id number]) -------------------------------------------------------------------------------- HPI Details Patient Name: [name], [name] Date of Service: 05/28/2022 11:00 AM Medical Record Number: [id number] Patient Account Number: [id number] Date of Birth/Sex: [birth year] (61 y.o. 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 This was actually broken up into 3 separate surgical events. The initial surgical intervention actually was on November 05, 2021 almost a year ago. Subsequently the patient went back in February for a seroma of the area which unfortunately required her to have a repeat surgery to go in and clean this out. And then again this occurred in April where she went back in and again they felt like stitches were coming out and there was an additional seroma. She was placed in a wound VAC initially and then subsequently as it got smaller that was  discontinued. Again right now I will see anything that I think a wound VAC would help with. Nonetheless she definitely has a significant depth to the wound that is going require packing. I actually believe the Hydrofera Blue rope would probably do quite well with this the problem is as much as it is draining she probably needs this to be changed at least every day. She does not really have anyone that can help with that that is the complicating scenario here. With that being said the patient does have a history of multiple sclerosis, hypertension, and again this surgical wound dehiscence in regard to her lumbar spine region. She did have a repeat MRI which was actually completed 10/09/2021. This showed that there was no significant change in the subcutaneous fluid collection/track of the lower lumbar region. This is extending to the level of the fascia unfortunately. This seems to go all the way from the L2 level with a track extending all the way to the fascia at the L4-5 level. Again this is a significant wound and there is significant drainage but does not seem to communicate to the spinal region as far as spinal fluid or otherwise is concerned that is good news. Nonetheless she last saw Dr. Franky Macho who is her neurosurgeon on 10/01/2021 that was when he ordered this last MRI she supposed to see him next week as well. With that being said he did not feel like there was any significant issue there but was not sure why this was not healing that is when he ordered the MRI. They were wanting to make sure that this was packed appropriately by home health unfortunately the main issue currently is that home health is completely out of the picture as the patient has exhausted all the home health that that she gets for a year. She is now in a very difficult  predicament where she does not have anyone to help her change the dressing and to be honest that she is not able to do it herself with the location of the  wound being on the midline lumbar spine region. If she does not have anyone that can help it is probably can to be necessary for her to go to a facility for rehab and daily dressing changes as I feel like daily changes which is much drainage that she is having is going to be necessary. 10/23/2021 upon evaluation today patient appears to be doing decently well in regard to her back ulcer. This does seem to be draining a lot less than what it is been doing in the past. With that being said she still has quite a bit of drainage nonetheless. I do think that given time this should improve least I hope so. The good news is she does have home health coming out 3 days a week were doing it 2 days a week and she is paying someone we can to help. 10/30/2021 upon evaluation today patient appears to be doing okay in regard to her back ulcer this is not draining quite as bad as it was in the beginning but he still has quite a bit of drainage noted. I do believe that the patient would benefit from [place] going ahead forward with attempting a wound VAC using the Hydrofera Blue rope to pack with and then subsequently using the VAC externally to actually suction out and help this to fill- in. I think this is our ideal way to try to get things cleared at this point. As it stands I am not certain that we are really making a progress that we want to see near with doing it in the way we are which is packing with the rope. It is a good dressing but I do think it is insufficient for total healing. She just seems to have too much in the way of drainage at this point unfortunately. 11/06/2021 upon evaluation today patient unfortunately continues to have issues with her back ongoing. The good news is her MRI that was repeated showed signs of the size of this area in the lumbar spine region having decreased from 4 cm to 3.5 cm this is definitely not bad news at all. With that being said unfortunately she continues to have issues with  ongoing drainage not as severe as in the beginning but nonetheless still significant. I do think a wound VAC still would be a good way to go although her home health agency nurse apparently has some concerns about the possibility of not being able to keep a seal with this as they had struggles in the past. Nonetheless I explained to the patient that this is much different than what she had previous and that I really feel like it would do much better as far as getting the area taken care of without having any complications or issues here. I think that we should be able to maintain a seal. Nonetheless at this time I did discuss with the patient as well that she probably does need to have a wound VAC in order for [place] to get this moving in the right direction. 11/13/2021 upon evaluation patient's wound bed actually showed signs of significant drainage at this time. She did see the surgeon yesterday he did not see anything that appeared to be infected. Nonetheless he does appear that she is continuing to have areas here that just do not seem  to want to seal up there MRI findings have been negative but nonetheless she continues to have is the seroma that is filling in. I do feel like we need to try to widen the hole so we can get at least a half of the [facility] then this will be better than nothing at this point. 11/20/2021 upon evaluation today patient actually appears to be having less pain at this point which is good news and overall she we still do not have the results of the culture back yet it had to be sent out to [facility] and we do not have the result back yet. Is doing decently well in regard to her wound. Fortunately there does not appear to be any signs of active infection systemically nor locally at this time. 11/27/2021 upon evaluation today patient appears to be doing well with regard to her wound all things considered there does appear to be less drainage than there was previous.  Fortunately I do not see any evidence of worsening of the patient is stating that she is having some issues with back pain. This is somewhat new. Again this I think could be related to the fact that she is having some issues here with infection. We are still waiting to see what the result of her culture shows from susceptibility testing Enterococcus has been identified but we do not know if this is VRE or not. 12/04/2021 upon evaluation today patient appears to be doing well with regard to her wound all things considered. I did have a conversation with Erin from Dr. Sueanne Margarita office. Of note she notes that Dr. Drinda Butts really does not want to do anything surgical right now which I completely understand. With that being said I am still leery of how far we will make it getting this to heal short of any type of surgery to open this up and allow [place] to more appropriately packed the wound. Nonetheless I will absolutely give it our best shot as far as that is concerned. I discussed that with the patient today. She voiced understanding. The good news is the drainage today seems to be clear it is no longer cloudy as it was previous I am actually very pleased in that regard. SHAQUANTA, HARKLESS ([id number]) 12/11/2021 since have last seen the patient actually did have an conversation with Dr. Franky Macho with her neurosurgeon. Again this involve the discussion around whether or not to open the wound and try to apply a wound VAC following. With that being said the decision was made that that may be the best thing to do if the patient was in agreement. Nonetheless I am actually extremely encouraged with what I am seeing today much more than I would have thought. In fact I think that we may be over packing the wound which is why she is having some discomfort at this point as there is much less of the dressing able to get and this time compared to what we saw previous. That is actually really good news. In fact the 6:00  tunnel appears to have filled in is awesome news. At the 12:00 location I am actually able to pack into that area pretty effectively at this point today. In fact I was able to get less of the packing in which I will detail below. 12/18/2021 upon evaluation today patient appears to be doing well with regard to her wound on the back. Fortunately there is no signs of active infection which is great news and overall  very pleased with where things stand today. No fevers, chills, nausea, vomiting, or diarrhea. 01/01/2022 upon evaluation today patient appears to be doing decently well in regard to her wound. Fortunately there does not appear to be any signs of anything worsening which is great news. She still continues to have issues with drainage but this is not nearly as significant as what it was in the past. There is all clear drainage no signs of purulence noted. 01/08/2022 upon evaluation today patient appears to be doing well with regard to her wound. With that being said she tells me that she has been having some increased pain over the past week. It does appear based on the amount of Hydrofera Blue that we removed this is probably over pack. Again it is difficult to know exactly how old the nurses getting all the skin but nonetheless the length of Hydrofera Blue that was utilized was way too much which may be part of the reason why this felt so uncomfortable. Fortunately I think that we can cut back on that we discussed pieces smaller so hopefully that will not be over packed. 01/22/2021 upon evaluation today patient appears to be doing well With regard to there being no signs of infection at this time. The wound does still tunnel mainly up at the 12:00 location. The depth of the tunnel complete from entry point to the base is about 3.5 cm today. With that being said I do believe that overall she is making good progress this is just a very slow process for her which I know has been frustrating as  well. 01/29/2022 upon evaluation today patient appears to be doing well with regard to the wound on her lower back and the lumbar spine region. The good news is the depth that I got this week was a little bit less going up at the 12:00 location as compared to last week. I measured this to be 3.5 cm last week and it was 3.3 cm this week. Again that is a minute shift but nonetheless a good shift in the right direction. Overall I think that we are on the right track. 02/05/2022 upon evaluation today patient appears to be doing well with regard to her wound. In fact right now her measurements are somewhere around 2.9 cm which is definitely smaller and less deep than last week At the 12:00 location. Overall I am very pleased with what we are seeing. 02/19/2022 upon evaluation today patient unfortunately is noting that the wound is actually closing up externally but internally there is still some depth to this. I discussed with her today that we cannot let it close up like this is can end up being a significant issue for her if we allow for that. Subsequently we need to do what we can to try to open this up and keep it open more effectively. She voiced understanding. With that being said we are going to proceed with that procedure today in order to debride away some of the skin on externally that is trying to close and on this. 02/26/2022 upon evaluation patient appears to be doing better in my opinion after we had to open up this area last week. Again she has a significant amount of healing and overall I am extremely pleased with where things stand currently. I do not see any evidence of active infection locally nor systemically at this time which is great news and in general I think that we are on the right track for getting  this hopefully closed final and done. 03/05/2022 upon evaluation today patient unfortunately is doing a little bit worse with regard to the whole size this is starting to get much  smaller unfortunately. There does not appear to be any signs of active infection locally nor systemically which is great news. With that being said I am concerned about the fact that the patient is doing quite a bit worse with regard to trapping some fluid and almost feels like she may have a fluid pocket that not only goes in and up towards 12:00 but looks back around towards the more superficial subcutaneous tissue. I am very concerned that this is going to be something that is very hard for [place] to heal short of another surgery to open this up and try to wound VAC from the inside out. Even then there is no guarantees this is just a very difficult situation to be perfectly honest. 03/14/2022 upon evaluation today patient appears to be doing well with regard to her wound as far as the area staying open and pleased in that regard. With that being said unfortunately she is not doing as well when it comes to the irritation around it appears to be very inflamed and red this is not good that was discussed with her today as well. Subsequently I do believe that working to need to do what we can do to try and calm this down in the past she did well with the Augmentin due to Enterococcus infection I am not sure if were dealing with the same thing or not but at the same time I think that is probably to be my go to at this point. 03/19/2022 upon evaluation today patient appears to be doing really about the same in regard to her wound that the pain is better. 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 She seems to be improved dramatically with the antibiotic currently although it does not cover for MRSA I am apt to just see how this progresses over the next several days to a week. With that being said the bigger issue here is that we simply do not seem to be making excellent progress and I am concerned about the lack of progress. I discussed with Dr. Shon Baton with [facility] in [place]  and to be honest his recommendation was that the best bet would probably be to get her to a teaching center. She is actually been seen for rheumatology and a I believe psychologist/psychiatrist at [facility]. She would prefer to go that direction as opposed to [facility] or [facility]. I am definitely okay with that. 03/26/2022 upon evaluation today patient appears to be doing okay in regard to her wound. She is not showing signs of worsening and overall she tells me that her pain is not nearly as bad as it was previous. Fortunately I do not see any evidence of active infection locally or systemically which is great news. No fevers, chills, nausea, vomiting, or diarrhea. 04-02-2022 upon evaluation today patient appears to be doing well with regard to her wound. It does not appear to be showing any signs of worsening which is great news. With that being said you are still having some issues here with drainage although it is clear and mainly just tenderness with bleeding from the [facility] I do think that treating for the second issue which is MRSA is probably the right thing to do at this point she is now off of the Augmentin. 04-09-2022 upon  evaluation today patient's wound actually appears to be doing about the same. Fortunately I do not see any evidence of active infection locally or systemically at this time which is great news. No fevers, chills, nausea, vomiting, or diarrhea. 04-16-2022 upon evaluation today patient appears to be doing well with regard to her wound in the lumbar spine region. Subsequently she continues to have an area that does have a tunnel up into the 12:00 location. This is about 3.5 cm using a skinny probe curved upwards to get into this region. With that being said I really do not see any signs of overall improvement but also do not see any signs of worsening I feel like work on about a still made here to some degree. The patient did see her neurologist and he has referred her to  neurosurgery at [facility] for second AZLYN, PUGH. ([id number]) opinion we will see what they have to say as well. 04-23-2022 upon evaluation today patient appears to be doing well with regard to her wound all things considered. She has been tolerating the dressing changes without complication. Fortunately I do not see any signs of active infection locally nor systemically which is great news. No fever chills noted 04-30-2022 upon evaluation today patient appears to be doing about the same in regard to her wound. The depth is really not dramatically improved as far as the 12:00 tunnel is concerned. The overall depth and the base of the wound does seem to be a little bit better it does sound like that external wound has closed as far as the opening is concerned we can have to cut this down from a half to a smaller size based on what we are seeing today. 05-07-2022 upon evaluation today patient's wound actually seems to be doing decently well. There is not a major shift here but a slight shift in the depth both straight and as well as in the [place] at 12:00. With that being said I again we will talk about a couple of millimeters but still every little bit can count when there are situations like this to be honest. 05-14-2022 upon evaluation today patient appears to be doing okay in regard to her wound. I do not see any signs of anything worsening. With that being said also do not see getting significantly better unfortunately. There does not appear to be any evidence of infection locally or systemically which is great news. No fevers, chills, nausea, vomiting, or diarrhea. 05-21-2022 upon evaluation today patient actually appears to be doing quite well in regard to her wound from the standpoint of there being no infection. With that being said there does not appear to be any evidence of infection locally nor systemically which is great news. No fevers, chills, nausea, vomiting, or diarrhea. 05-28-2022  upon evaluation today patient's wound actually appears to be doing about the same. I do not see any evidence of active infection locally or systemically which is great news. No fevers, chills, nausea, vomiting, or diarrhea. Electronic Signature(s) Signed: 05/28/2022 12:07:01 PM By: [name] PA-C Entered By: [name] on 05/28/2022 12:07:01 Gwilliam, Dawn Salena Saner ([id number]) -------------------------------------------------------------------------------- Physical Exam Details Patient Name: [name], [name] Date of Service: 05/28/2022 11:00 AM Medical Record Number: [id number] Patient Account Number: [id number] Date of Birth/Sex: Dec 01, 1961 (61 y.o. F) Treating RN: Huel Coventry Primary Care Provider: [name] Other Clinician: Referring Provider: Card, John Treating Provider/Extender: Rowan Blase in Treatment: 32 Constitutional Well-nourished and well-hydrated in no acute distress. Respiratory normal  breathing without difficulty. Psychiatric this patient is able to make decisions and demonstrates good insight into disease process. Alert and Oriented x 3. pleasant and cooperative. Notes Upon inspection patient's wound bed actually showed signs of still having no wound that goes about the same as far as depth and then when I go up at 12:00 again this Is pretty much about the same. I am really not seeing significant improvements unfortunately which was discussed with the patient today as well. With that being said I do believe that she would benefit from a second opinion from a neurosurgeon. With that being said they originally assumed that she was in the 90-day postop as her surgery was last year in April. However that was incorrect and there can a send it back for rereview. Electronic Signature(s) Signed: 05/28/2022 12:07:45 PM By: [name] PA-C Entered By: [name] on 05/28/2022 12:07:45 Fons, Domingo Pulse  ([id number]) -------------------------------------------------------------------------------- Physician Orders Details Patient Name: Kiesler, Shai C. Date of Service: 05/28/2022 11:00 AM Medical Record Number: [id number] Patient Account Number: [id number] Date of Birth/Sex: 04/24/1961 (61 y.o. 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 o Nurse Visit as needed Home Health o Home Health Company: [facility] o [facility] for wound care. May utilize formulary equivalent dressing for wound treatment orders unless otherwise specified. Home Health Nurse may visit PRN to address patientos wound care needs. - Monday and Friday [facility] fax [phone] Bathing/ Shower/ Hygiene o May shower; gently cleanse wound with antibacterial soap, rinse and pat dry prior to dressing wounds o No tub bath. Anesthetic (Use 'Patient Medications' Section for Anesthetic Order Entry) o Lidocaine applied to wound bed Edema Control - Lymphedema / Segmental Compressive Device / Other o Elevate, Exercise Daily and Avoid Standing for Long Periods of Time. o Elevate legs to the level of the heart and pump ankles as often as possible o Elevate leg(s) parallel to the floor when sitting. Additional Orders / Instructions o Follow Nutritious Diet and Increase Protein Intake Wound Treatment Wound #1 - Back Wound Laterality: Midline, Distal Cleanser: Normal Saline 1 x Per Day/30 Days Discharge Instructions:  Wash your hands with soap and water. Remove old dressing, discard into plastic bag and place into trash. Cleanse the wound with Normal Saline prior to applying a clean dressing using gauze sponges, not tissues or cotton balls. Do not scrub or use excessive force. Pat dry using gauze sponges, not tissue or cotton balls. Primary Dressing: Hydrofera Blue Rope 1 x Per Day/30 Days Discharge Instructions: cut into fourths; angle the end Secondary Dressing: (SILICONE BORDER) Zetuvit Plus SILICONE BORDER Dressing 4x4 (in/in) 1 x Per Day/30 Days Electronic Signature(s) Signed: 05/28/2022 12:09:48 PM By: [name] PA-C Signed: 05/28/2022 1:58:17 PM By: Elliot Gurney, BSN, RN, CWS, Kim RN, BSN Entered By: Elliot Gurney, BSN, RN, CWS, Kim on 05/28/2022 11:38:38 Meng, Domingo Pulse ([id number]) -------------------------------------------------------------------------------- Problem List Details Patient Name: Barnhardt, Rachella C. Date of Service: 05/28/2022 11:00 AM Medical Record Number: [id number] Patient Account Number: [id number] Date of Birth/Sex: Mar 14, 1961 (61 y.o. 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 ([id number]) -------------------------------------------------------------------------------- Progress Note  Details Patient Name: Fath, Maurisha C. Date of Service: 05/28/2022 11:00 AM Medical Record Number: [id number] Patient Account Number: [id number] Date of Birth/Sex: Jan 27, 1961 (61 y.o. F) Treating RN: Huel Coventry Primary Care Provider: [name] Other Clinician: Referring Provider: Card, John Treating Provider/Extender: Rowan Blase in Treatment: 32 Subjective Chief Complaint Information obtained from Patient Surgical Back Ulcer History of Present Illness (HPI) 10/15/2021 upon evaluation today patient presents for initial evaluation here in the clinic concerning a surgical ulceration/dehiscence in the lumbar spine region following surgery that she had over the past year. This was actually broken up into 3 separate surgical events. The initial surgical intervention actually was on November 05, 2021 almost a year ago. Subsequently the patient went back in February for a seroma of the area which unfortunately required her to have a repeat surgery to go in and clean this out. And then again this occurred in April where she went back in and again they felt like stitches were coming out and there was an additional seroma. She was placed in a wound VAC initially and then subsequently as it got smaller that was discontinued. Again right now I will see anything that I think a wound VAC would help with. Nonetheless she definitely has a significant depth to the wound that is going require packing. I actually believe the Hydrofera Blue rope would probably do quite well with this the problem is as much as it is draining she probably needs this to be changed at least every day. She does not really have anyone that can help with that that is the complicating scenario here. With that being said the patient does have a history of multiple sclerosis, hypertension, and again this surgical wound dehiscence in regard to her lumbar spine region. She did have a repeat MRI which was actually completed  10/09/2021. This showed that there was no significant change in the subcutaneous fluid collection/track of the lower lumbar region. This is extending to the level of the fascia unfortunately. This seems to go all the way from the L2 level with a track extending all the way to the fascia at the L4-5 level. Again this is a significant wound and there is significant drainage but does not seem to communicate to the spinal region as far as spinal fluid or otherwise is concerned that is good news. Nonetheless she last saw Dr. Franky Macho who is her neurosurgeon on 10/01/2021 that was when he ordered this last MRI she supposed to see him next week as well. With that being said he did not feel like there was any significant issue there but was not sure why this was not healing that is when he ordered the MRI. They were wanting to make sure that this was packed appropriately by home health unfortunately the main issue currently is that home health is completely out of the picture as the patient has exhausted all the home health that that she gets for a year. She is  now in a very difficult predicament where she does not have anyone to help her change the dressing and to be honest that she is not able to do it herself with the location of the wound being on the midline lumbar spine region. If she does not have anyone that can help it is probably can to be necessary for her to go to a facility for rehab and daily dressing changes as I feel like daily changes which is much drainage that she is having is going to be necessary. 10/23/2021 upon evaluation today patient appears to be doing decently well in regard to her back ulcer. This does seem to be draining a lot less than what it is been doing in the past. With that being said she still has quite a bit of drainage nonetheless. I do think that given time this should improve least I hope so. The good news is she does have home health coming out 3 days a week were doing it  2 days a week and she is paying someone we can to help. 10/30/2021 upon evaluation today patient appears to be doing okay in regard to her back ulcer this is not draining quite as bad as it was in the beginning but he still has quite a bit of drainage noted. I do believe that the patient would benefit from [place] going ahead forward with attempting a wound VAC using the Hydrofera Blue rope to pack with and then subsequently using the VAC externally to actually suction out and help this to fill- in. I think this is our ideal way to try to get things cleared at this point. As it stands I am not certain that we are really making a progress that we want to see near with doing it in the way we are which is packing with the rope. It is a good dressing but I do think it is insufficient for total healing. She just seems to have too much in the way of drainage at this point unfortunately. 11/06/2021 upon evaluation today patient unfortunately continues to have issues with her back ongoing. The good news is her MRI that was repeated showed signs of the size of this area in the lumbar spine region having decreased from 4 cm to 3.5 cm this is definitely not bad news at all. With that being said unfortunately she continues to have issues with ongoing drainage not as severe as in the beginning but nonetheless still significant. I do think a wound VAC still would be a good way to go although her home health agency nurse apparently has some concerns about the possibility of not being able to keep a seal with this as they had struggles in the past. Nonetheless I explained to the patient that this is much different than what she had previous and that I really feel like it would do much better as far as getting the area taken care of without having any complications or issues here. I think that we should be able to maintain a seal. Nonetheless at this time I did discuss with the patient as well that she probably does need to  have a wound VAC in order for [place] to get this moving in the right direction. 11/13/2021 upon evaluation patient's wound bed actually showed signs of significant drainage at this time. She did see the surgeon yesterday he did not see anything that appeared to be infected. Nonetheless he does appear that she is continuing to have areas here  that just do not seem to want to seal up there MRI findings have been negative but nonetheless she continues to have is the seroma that is filling in. I do feel like we need to try to widen the hole so we can get at least a half of the [facility] then this will be better than nothing at this point. 11/20/2021 upon evaluation today patient actually appears to be having less pain at this point which is good news and overall she we still do not have the results of the culture back yet it had to be sent out to [facility] and we do not have the result back yet. Is doing decently well in regard to her wound. Fortunately there does not appear to be any signs of active infection systemically nor locally at this time. 11/27/2021 upon evaluation today patient appears to be doing well with regard to her wound all things considered there does appear to be less drainage than there was previous. Fortunately I do not see any evidence of worsening of the patient is stating that she is having some issues with back pain. This is somewhat new. Again this I think could be related to the fact that she is having some issues here with infection. We are still waiting to see what the result of her culture shows from susceptibility testing Enterococcus has been identified but we do not know if this is VRE or not. 12/04/2021 upon evaluation today patient appears to be doing well with regard to her wound all things considered. I did have a conversation with Erin from Dr. Sueanne Margarita office. Of note she notes that Dr. Drinda Butts really does not want to do anything surgical right now which I  completely Devora, Jolanta C. ([id number]) understand. With that being said I am still leery of how far we will make it getting this to heal short of any type of surgery to open this up and allow [place] to more appropriately packed the wound. Nonetheless I will absolutely give it our best shot as far as that is concerned. I discussed that with the patient today. She voiced understanding. The good news is the drainage today seems to be clear it is no longer cloudy as it was previous I am actually very pleased in that regard. 12/11/2021 since have last seen the patient actually did have an conversation with Dr. Franky Macho with her neurosurgeon. Again this involve the discussion around whether or not to open the wound and try to apply a wound VAC following. With that being said the decision was made that that may be the best thing to do if the patient was in agreement. Nonetheless I am actually extremely encouraged with what I am seeing today much more than I would have thought. In fact I think that we may be over packing the wound which is why she is having some discomfort at this point as there is much less of the dressing able to get and this time compared to what we saw previous. That is actually really good news. In fact the 6:00 tunnel appears to have filled in is awesome news. At the 12:00 location I am actually able to pack into that area pretty effectively at this point today. In fact I was able to get less of the packing in which I will detail below. 12/18/2021 upon evaluation today patient appears to be doing well with regard to her wound on the back. Fortunately there is no signs of active infection which is  great news and overall very pleased with where things stand today. No fevers, chills, nausea, vomiting, or diarrhea. 01/01/2022 upon evaluation today patient appears to be doing decently well in regard to her wound. Fortunately there does not appear to be any signs of anything worsening which is  great news. She still continues to have issues with drainage but this is not nearly as significant as what it was in the past. There is all clear drainage no signs of purulence noted. 01/08/2022 upon evaluation today patient appears to be doing well with regard to her wound. With that being said she tells me that she has been having some increased pain over the past week. It does appear based on the amount of Hydrofera Blue that we removed this is probably over pack. Again it is difficult to know exactly how old the nurses getting all the skin but nonetheless the length of Hydrofera Blue that was utilized was way too much which may be part of the reason why this felt so uncomfortable. Fortunately I think that we can cut back on that we discussed pieces smaller so hopefully that will not be over packed. 01/22/2021 upon evaluation today patient appears to be doing well With regard to there being no signs of infection at this time. The wound does still tunnel mainly up at the 12:00 location. The depth of the tunnel complete from entry point to the base is about 3.5 cm today. With that being said I do believe that overall she is making good progress this is just a very slow process for her which I know has been frustrating as well. 01/29/2022 upon evaluation today patient appears to be doing well with regard to the wound on her lower back and the lumbar spine region. The good news is the depth that I got this week was a little bit less going up at the 12:00 location as compared to last week. I measured this to be 3.5 cm last week and it was 3.3 cm this week. Again that is a minute shift but nonetheless a good shift in the right direction. Overall I think that we are on the right track. 02/05/2022 upon evaluation today patient appears to be doing well with regard to her wound. In fact right now her measurements are somewhere around 2.9 cm which is definitely smaller and less deep than last week At the 12:00  location. Overall I am very pleased with what we are seeing. 02/19/2022 upon evaluation today patient unfortunately is noting that the wound is actually closing up externally but internally there is still some depth to this. I discussed with her today that we cannot let it close up like this is can end up being a significant issue for her if we allow for that. Subsequently we need to do what we can to try to open this up and keep it open more effectively. She voiced understanding. With that being said we are going to proceed with that procedure today in order to debride away some of the skin on externally that is trying to close and on this. 02/26/2022 upon evaluation patient appears to be doing better in my opinion after we had to open up this area last week. Again she has a significant amount of healing and overall I am extremely pleased with where things stand currently. I do not see any evidence of active infection locally nor systemically at this time which is great news and in general I think that we are on  the right track for getting this hopefully closed final and done. 03/05/2022 upon evaluation today patient unfortunately is doing a little bit worse with regard to the whole size this is starting to get much smaller unfortunately. There does not appear to be any signs of active infection locally nor systemically which is great news. With that being said I am concerned about the fact that the patient is doing quite a bit worse with regard to trapping some fluid and almost feels like she may have a fluid pocket that not only goes in and up towards 12:00 but looks back around towards the more superficial subcutaneous tissue. I am very concerned that this is going to be something that is very hard for [place] to heal short of another surgery to open this up and try to wound VAC from the inside out. Even then there is no guarantees this is just a very difficult situation to be perfectly honest. 03/14/2022  upon evaluation today patient appears to be doing well with regard to her wound as far as the area staying open and pleased in that regard. With that being said unfortunately she is not doing as well when it comes to the irritation around it appears to be very inflamed and red this is not good that was discussed with her today as well. Subsequently I do believe that working to need to do what we can do to try and calm this down in the past she did well with the Augmentin due to Enterococcus infection I am not sure if were dealing with the same thing or not but at the same time I think that is probably to be my go to at this point. 03/19/2022 upon evaluation today patient appears to be doing really about the same in regard to her wound that the pain is better. 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 She seems to be improved dramatically with the antibiotic currently although it does not cover for MRSA I am apt to just see how this progresses over the next several days to a week. With that being said the bigger issue here is that we simply do not seem to be making excellent progress and I am concerned about the lack of progress. I discussed with Dr. Shon Baton with [facility] in [place] and to be honest his recommendation was that the best bet would probably be to get her to a teaching center. She is actually been seen for rheumatology and a I believe psychologist/psychiatrist at [facility]. She would prefer to go that direction as opposed to [facility] or [facility]. I am definitely okay with that. 03/26/2022 upon evaluation today patient appears to be doing okay in regard to her wound. She is not showing signs of worsening and overall she tells me that her pain is not nearly as bad as it was previous. Fortunately I do not see any evidence of active infection locally or systemically which is great news. No fevers, chills, nausea, vomiting, or diarrhea. 04-02-2022 upon evaluation today patient  appears to be doing well with regard to her wound. It does not appear to be showing any signs of worsening which is great news. With that being said you are still having some issues here with drainage although it is clear and mainly just tenderness with bleeding from the [facility] I do think that treating for the second issue which is MRSA is probably the right thing to do at this point she is now off  of the Augmentin. 04-09-2022 upon evaluation today patient's wound actually appears to be doing about the same. Fortunately I do not see any evidence of active infection locally or systemically at this time which is great news. No fevers, chills, nausea, vomiting, or diarrhea. ANARA, COWMAN ([id number]) 04-16-2022 upon evaluation today patient appears to be doing well with regard to her wound in the lumbar spine region. Subsequently she continues to have an area that does have a tunnel up into the 12:00 location. This is about 3.5 cm using a skinny probe curved upwards to get into this region. With that being said I really do not see any signs of overall improvement but also do not see any signs of worsening I feel like work on about a still made here to some degree. The patient did see her neurologist and he has referred her to neurosurgery at [facility] for second opinion we will see what they have to say as well. 04-23-2022 upon evaluation today patient appears to be doing well with regard to her wound all things considered. She has been tolerating the dressing changes without complication. Fortunately I do not see any signs of active infection locally nor systemically which is great news. No fever chills noted 04-30-2022 upon evaluation today patient appears to be doing about the same in regard to her wound. The depth is really not dramatically improved as far as the 12:00 tunnel is concerned. The overall depth and the base of the wound does seem to be a little bit better it does sound like that  external wound has closed as far as the opening is concerned we can have to cut this down from a half to a smaller size based on what we are seeing today. 05-07-2022 upon evaluation today patient's wound actually seems to be doing decently well. There is not a major shift here but a slight shift in the depth both straight and as well as in the [place] at 12:00. With that being said I again we will talk about a couple of millimeters but still every little bit can count when there are situations like this to be honest. 05-14-2022 upon evaluation today patient appears to be doing okay in regard to her wound. I do not see any signs of anything worsening. With that being said also do not see getting significantly better unfortunately. There does not appear to be any evidence of infection locally or systemically which is great news. No fevers, chills, nausea, vomiting, or diarrhea. 05-21-2022 upon evaluation today patient actually appears to be doing quite well in regard to her wound from the standpoint of there being no infection. With that being said there does not appear to be any evidence of infection locally nor systemically which is great news. No fevers, chills, nausea, vomiting, or diarrhea. 05-28-2022 upon evaluation today patient's wound actually appears to be doing about the same. I do not see any evidence of active infection locally or systemically which is great news. No fevers, chills, nausea, vomiting, or diarrhea. Objective Constitutional Well-nourished and well-hydrated in no acute distress. Vitals Time Taken: 11:20 AM, Weight: 275 lbs, Temperature: 97.8 F, Pulse: 73 bpm, Respiratory Rate: 18 breaths/min, Blood Pressure: 123/76 mmHg. Respiratory normal breathing without difficulty. Psychiatric this patient is able to make decisions and demonstrates good insight into disease process. Alert and Oriented x 3. pleasant and cooperative. General Notes: Upon inspection patient's wound bed  actually showed signs of still having no wound that goes about the same as far  as depth and then when I go up at 12:00 again this Is pretty much about the same. I am really not seeing significant improvements unfortunately which was discussed with the patient today as well. With that being said I do believe that she would benefit from a second opinion from a neurosurgeon. With that being said they originally assumed that she was in the 90-day postop as her surgery was last year in April. However that was incorrect and there can a send it back for rereview. Integumentary (Hair, Skin) Wound #1 status is Open. Original cause of wound was Surgical Injury. The date acquired was: 04/11/2021. The wound has been in treatment 32 weeks. The wound is located on the Distal,Midline Back. The wound measures 0.5cm length x 0.3cm width x 3.4cm depth; 0.118cm^2 area and 0.401cm^3 volume. There is Fat Layer (Subcutaneous Tissue) exposed. There is no undermining noted. There is a medium amount of serous drainage noted. There is large (67-100%) red granulation within the wound bed. There is no necrotic tissue within the wound bed. 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 ([id number]) Multiple sclerosis Essential (primary) hypertension Plan Follow-up Appointments: Return Appointment in 1 week. Nurse Visit as needed Home Health: Home Health Company: - [facility] for wound care. May utilize formulary equivalent dressing for wound treatment orders unless otherwise specified. Home Health Nurse may visit PRN to address patient s wound care needs. - Monday and Friday [facility] fax [phone] Bathing/ Shower/ Hygiene: May shower; gently cleanse wound with antibacterial soap, rinse and pat dry prior to dressing wounds No tub  bath. Anesthetic (Use 'Patient Medications' Section for Anesthetic Order Entry): Lidocaine applied to wound bed Edema Control - Lymphedema / Segmental Compressive Device / Other: Elevate, Exercise Daily and Avoid Standing for Long Periods of Time. Elevate legs to the level of the heart and pump ankles as often as possible Elevate leg(s) parallel to the floor when sitting. Additional Orders / Instructions: Follow Nutritious Diet and Increase Protein Intake WOUND #1: - Back Wound Laterality: Midline, Distal Cleanser: Normal Saline 1 x Per Day/30 Days Discharge Instructions: Wash your hands with soap and water. Remove old dressing, discard into plastic bag and place into trash. Cleanse the wound with Normal Saline prior to applying a clean dressing using gauze sponges, not tissues or cotton balls. Do not scrub or use excessive force. Pat dry using gauze sponges, not tissue or cotton balls. Primary Dressing: Hydrofera Blue Rope 1 x Per Day/30 Days Discharge Instructions: cut into fourths; angle the end Secondary Dressing: (SILICONE BORDER) Zetuvit Plus SILICONE BORDER Dressing 4x4 (in/in) 1 x Per Day/30 Days 1. I would recommend currently that we go ahead and continue with the Hydrofera Blue rope packing I felt that still her best bet although this is getting much harder to do afraid that this hole is getting close up food and get something moving soon. 2. Also can recommend that she should continue to follow-up with the neurosurgeon regarding the referral again there was some confusion about a postop global again they had missed read this was actually from a year ago in April not this year. 3. I am also going to suggest that she continue to monitor for any signs of worsening infection if anything changes she should let me know. We will see patient back for reevaluation in 1 week here in the clinic. If anything worsens  or changes patient will contact our office for  additional recommendations. Electronic Signature(s) Signed: 05/28/2022 12:09:02 PM By: [name] PA-C Entered By: [name] on 05/28/2022 12:09:01 Haver, Velta Salena Saner ([id number]) -------------------------------------------------------------------------------- SuperBill Details Patient Name: Domek, Korena C. Date of Service: 05/28/2022 Medical Record Number: [id number] Patient Account Number: [id number] Date of Birth/Sex: [birth year] (61 y.o. 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 G35 Multiple sclerosis I10 Essential (primary) hypertension Facility Procedures CPT4 Code: 24580998 Description: 99213 - WOUND CARE VISIT-LEV 3 EST PT Modifier: Quantity: 1 Physician Procedures CPT4 Code: 3382505 Description: 99214 - WC PHYS LEVEL 4 - EST PT Modifier: Quantity: 1 CPT4 Code: Description: ICD-10 Diagnosis Description T81.31XA Disruption of external operation (surgical) wound, not elsewhere classifi 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

## 2022-05-28 NOTE — Progress Notes (Addendum)
LETETIA, TANGO ([id number]) Visit Report for 05/28/2022 Arrival Information Details Patient Name: [name], [name] Date of Service: 05/28/2022 11:00 AM Medical Record Number: [id number] Patient Account Number: [id number] Date of Birth/Sex: [birth year] (61 y.o. F) Treating RN: Cornell Barman Primary Care Caiya Bettes: Clayborn Heron Other Clinician: Referring Chaynce Schafer: Card, John Treating Champagne Paletta/Extender: Skipper Cliche in Treatment: 47 Visit Information History Since Last Visit Added or deleted any medications: No Patient Arrived: Walker Has Dressing in Place as Prescribed: Yes Arrival Time: 11:19 Pain Present Now: Yes Transfer Assistance: None Patient Identification Verified: Yes Secondary Verification Process Completed: Yes Patient Requires Transmission-Based Precautions: No Patient Has Alerts: No Electronic Signature(s) Signed: 05/28/2022 1:58:17 PM By: Gretta Cool, BSN, RN, CWS, Kim RN, BSN Entered By: Gretta Cool, BSN, RN, CWS, Kim on 05/28/2022 11:19:17 [name], [name] ([id number]) -------------------------------------------------------------------------------- Clinic Level of Care Assessment Details Patient Name: [name], [name] Date of Service: 05/28/2022 11:00 AM Medical Record Number: [id number] Patient Account Number: [id number] Date of Birth/Sex: 16-Apr-1961 (61 y.o. F) Treating RN: Cornell Barman Primary Care Kalana Yust: Card, Jenny Reichmann Other Clinician: Referring Sangeeta Youse: Card, John Treating [place] Stell/Extender: Skipper Cliche in Treatment: 32 Clinic Level of Care Assessment Items TOOL 4 Quantity Score []  - Use when only an EandM is performed on FOLLOW-UP visit 0 ASSESSMENTS - Nursing Assessment / Reassessment X - Reassessment of Co-morbidities (includes updates in patient status) 1 10 X- 1 5 Reassessment of Adherence to Treatment Plan ASSESSMENTS - Wound and Skin Assessment / Reassessment X - Simple Wound Assessment / Reassessment - one wound 1 5 []  - 0 Complex Wound Assessment  / Reassessment - multiple wounds []  - 0 Dermatologic / Skin Assessment (not related to wound area) ASSESSMENTS - Focused Assessment []  - Circumferential Edema Measurements - multi extremities 0 []  - 0 Nutritional Assessment / Counseling / Intervention []  - 0 Lower Extremity Assessment (monofilament, tuning fork, pulses) []  - 0 Peripheral Arterial Disease Assessment (using hand held doppler) ASSESSMENTS - Ostomy and/or Continence Assessment and Care []  - Incontinence Assessment and Management 0 []  - 0 Ostomy Care Assessment and Management (repouching, etc.) PROCESS - Coordination of Care X - Simple Patient / Family Education for ongoing care 1 15 []  - 0 Complex (extensive) Patient / Family Education for ongoing care X- 1 10 Staff obtains Programmer, systems, Records, Test Results / Process Orders []  - 0 Staff telephones HHA, Nursing Homes / Clarify orders / etc []  - 0 Routine Transfer to another Facility (non-emergent condition) []  - 0 Routine Hospital Admission (non-emergent condition) []  - 0 New Admissions / Biomedical engineer / Ordering NPWT, Apligraf, etc. []  - 0 Emergency Hospital Admission (emergent condition) X- 1 10 Simple Discharge Coordination []  - 0 Complex (extensive) Discharge Coordination PROCESS - Special Needs []  - Pediatric / Minor Patient Management 0 []  - 0 Isolation Patient Management []  - 0 Hearing / Language / Visual special needs []  - 0 Assessment of Community assistance (transportation, D/C planning, etc.) []  - 0 Additional assistance / Altered mentation []  - 0 Support Surface(s) Assessment (bed, cushion, seat, etc.) INTERVENTIONS - Wound Cleansing / Measurement [name], [name] ([id number]) X- 1 5 Simple Wound Cleansing - one wound []  - 0 Complex Wound Cleansing - multiple wounds X- 1 5 Wound Imaging (photographs - any number of wounds) []  - 0 Wound Tracing (instead of photographs) X- 1 5 Simple Wound Measurement - one wound []  -  0 Complex Wound Measurement - multiple wounds INTERVENTIONS - Wound Dressings []  - Small Wound Dressing one or multiple wounds 0 X-  1 15 Medium Wound Dressing one or multiple wounds []  - 0 Large Wound Dressing one or multiple wounds []  - 0 Application of Medications - topical []  - 0 Application of Medications - injection INTERVENTIONS - Miscellaneous []  - External ear exam 0 []  - 0 Specimen Collection (cultures, biopsies, blood, body fluids, etc.) []  - 0 Specimen(s) / Culture(s) sent or taken to Lab for analysis []  - 0 Patient Transfer (multiple staff / Civil Service fast streamer / Similar devices) []  - 0 Simple Staple / Suture removal (25 or less) []  - 0 Complex Staple / Suture removal (26 or more) []  - 0 Hypo / Hyperglycemic Management (close monitor of Blood Glucose) []  - 0 Ankle / Brachial Index (ABI) - do not check if billed separately X- 1 5 Vital Signs Has the patient been seen at the hospital within the last three years: Yes Total Score: 90 Level Of Care: New/Established - Level 3 Electronic Signature(s) Signed: 05/28/2022 1:58:17 PM By: Gretta Cool, BSN, RN, CWS, Kim RN, BSN Entered By: Gretta Cool, BSN, RN, CWS, Kim on 05/28/2022 11:43:46 [name], [name] ([id number]) -------------------------------------------------------------------------------- Encounter Discharge Information Details Patient Name: [name], [name] Date of Service: 05/28/2022 11:00 AM Medical Record Number: [id number] Patient Account Number: [id number] Date of Birth/Sex: [birth year] (61 y.o. F) Treating RN: Cornell Barman Primary Care Chabely Norby: Clayborn Heron Other Clinician: Referring Mayo Owczarzak: Card, John Treating Ethelene Closser/Extender: Skipper Cliche in Treatment: 32 Encounter Discharge Information Items Discharge Condition: Stable Ambulatory Status: Walker Discharge Destination: Home Transportation: Private Auto Schedule Follow-up Appointment: Yes Clinical Summary of Care: Electronic Signature(s) Signed: 05/28/2022  1:58:17 PM By: Gretta Cool, BSN, RN, CWS, Kim RN, BSN Entered By: Gretta Cool, BSN, RN, CWS, Kim on 05/28/2022 11:45:21 Abundis, [name] ([id number]) -------------------------------------------------------------------------------- Lower Extremity Assessment Details Patient Name: [name], [name] Date of Service: 05/28/2022 11:00 AM Medical Record Number: [id number] Patient Account Number: [id number] Date of Birth/Sex: 09-04-1961 (61 y.o. F) Treating RN: Cornell Barman Primary Care Nesbit Michon: Clayborn Heron Other Clinician: Referring Gilbert Narain: Card, John Treating Densel Kronick/Extender: Skipper Cliche in Treatment: 32 Electronic Signature(s) Signed: 05/28/2022 1:58:17 PM By: Gretta Cool, BSN, RN, CWS, Kim RN, BSN Entered By: Gretta Cool, BSN, RN, CWS, Kim on 05/28/2022 11:36:14 Walter, [name] ([id number]) -------------------------------------------------------------------------------- Multi Wound Chart Details Patient Name: [name], [name] Date of Service: 05/28/2022 11:00 AM Medical Record Number: [id number] Patient Account Number: [id number] Date of Birth/Sex: 02-21-1961 (61 y.o. F) Treating RN: Cornell Barman Primary Care Oviya Ammar: Clayborn Heron Other Clinician: Referring Mattheus Rauls: Card, John Treating Homar Weinkauf/Extender: Skipper Cliche in Treatment: 32 Vital Signs Height(in): Pulse(bpm): 64 Weight(lbs): 275 Blood Pressure(mmHg): 123/76 Body Mass Index(BMI): Temperature(F): 97.8 Respiratory Rate(breaths/min): 18 Photos: [N/A:N/A] Wound Location: Distal, Midline Back N/A N/A Wounding Event: Surgical Injury N/A N/A Primary Etiology: Dehisced Wound N/A N/A Comorbid History: Hypertension N/A N/A Date Acquired: 04/11/2021 N/A N/A Weeks of Treatment: 32 N/A N/A Wound Status: Open N/A N/A Wound Recurrence: No N/A N/A Measurements L x W x D (cm) 0.5x0.3x4.3 N/A N/A Area (cm) : 0.118 N/A N/A Volume (cm) : 0.507 N/A N/A % Reduction in Area: 6.30% N/A N/A % Reduction in Volume: 12.30% N/A  N/A Classification: Full Thickness Without Exposed N/A N/A Support Structures Exudate Amount: Medium N/A N/A Exudate Type: Serous N/A N/A Exudate Color: amber N/A N/A Granulation Amount: Large (67-100%) N/A N/A Granulation Quality: Red N/A N/A Necrotic Amount: None Present (0%) N/A N/A Exposed Structures: Fat Layer (Subcutaneous Tissue): N/A N/A Yes Fascia: No Tendon: No Muscle: No Joint: No Bone: No Epithelialization: None N/A N/A Treatment Notes Electronic  Signature(s) Signed: 05/28/2022 1:58:17 PM By: Gretta Cool, BSN, RN, CWS, Kim RN, BSN Entered By: Gretta Cool, BSN, RN, CWS, Kim on 05/28/2022 11:28:52 TOBYN, CHICA ([id number]) -------------------------------------------------------------------------------- Multi-Disciplinary Care Plan Details Patient Name: [name], [name] Date of Service: 05/28/2022 11:00 AM Medical Record Number: [id number] Patient Account Number: [id number] Date of Birth/Sex: [birth year] (61 y.o. F) Treating RN: Cornell Barman Primary Care Ella Golomb: Clayborn Heron Other Clinician: Referring Melissaann Dizdarevic: Card, John Treating Haliey Romberg/Extender: Skipper Cliche in Treatment: 32 Active Inactive Wound/Skin Impairment Nursing Diagnoses: Knowledge deficit related to ulceration/compromised skin integrity Goals: Patient/caregiver will verbalize understanding of skin care regimen Date Initiated: 10/15/2021 Target Resolution Date: 06/15/2022 Goal Status: Active Ulcer/skin breakdown will have a volume reduction of 30% by week 4 Date Initiated: 10/15/2021 Date Inactivated: 01/29/2022 Target Resolution Date: 12/15/2021 Goal Status: Unmet Unmet Reason: comorbities Ulcer/skin breakdown will have a volume reduction of 50% by week 8 Date Initiated: 10/15/2021 Date Inactivated: 01/29/2022 Target Resolution Date: 01/15/2022 Goal Status: Unmet Unmet Reason: comorbities Ulcer/skin breakdown will have a volume reduction of 80% by week 12 Date Initiated: 10/15/2021 Date  Inactivated: 02/19/2022 Target Resolution Date: 02/15/2022 Goal Status: Unmet Unmet Reason: comorbities Ulcer/skin breakdown will heal within 14 weeks Date Initiated: 10/15/2021 Date Inactivated: 05/07/2022 Target Resolution Date: 03/15/2022 Goal Status: Unmet Unmet Reason: comorbities Interventions: Assess patient/caregiver ability to obtain necessary supplies Assess patient/caregiver ability to perform ulcer/skin care regimen upon admission and as needed Assess ulceration(s) every visit Notes: Electronic Signature(s) Signed: 05/28/2022 1:58:17 PM By: Gretta Cool, BSN, RN, CWS, Kim RN, BSN Entered By: Gretta Cool, BSN, RN, CWS, Kim on 05/28/2022 11:28:44 [name], [name] ([id number]) -------------------------------------------------------------------------------- Pain Assessment Details Patient Name: [name], [name] Date of Service: 05/28/2022 11:00 AM Medical Record Number: [id number] Patient Account Number: [id number] Date of Birth/Sex: 12-Apr-1961 (61 y.o. F) Treating RN: Cornell Barman Primary Care Aleenah Homen: Clayborn Heron Other Clinician: Referring Davyn Morandi: Card, John Treating Reily Ilic/Extender: Skipper Cliche in Treatment: 32 Active Problems Location of Pain Severity and Description of Pain Patient Has Paino Yes Site Locations Pain Location: Pain in Ulcers With Dressing Change: Yes Duration of the Pain. Constant / Intermittento Constant Rate the pain. Current Pain Level: 4 Character of Pain Describe the Pain: Aching, Sharp Pain Management and Medication Current Pain Management: Medication: Yes Electronic Signature(s) Signed: 05/28/2022 1:58:17 PM By: Gretta Cool, BSN, RN, CWS, Kim RN, BSN Entered By: Gretta Cool, BSN, RN, CWS, Kim on 05/28/2022 11:22:36 [name], [name] ([id number]) -------------------------------------------------------------------------------- Patient/Caregiver Education Details Patient Name: [name], Niasha C. Date of Service: 05/28/2022 11:00 AM Medical Record  Number: [id number] Patient Account Number: [id number] Date of Birth/Gender: 1961/02/23 (61 y.o. F) Treating RN: Cornell Barman Primary Care Physician: Clayborn Heron Other Clinician: Referring Physician: Card, John Treating Physician/Extender: Skipper Cliche in Treatment: 66 Education Assessment Education Provided To: Patient Education Topics Provided Wound/Skin Impairment: Handouts: Caring for Your Ulcer, Other: continue wound care as prescribed. Electronic Signature(s) Signed: 05/28/2022 1:58:17 PM By: Gretta Cool, BSN, RN, CWS, Kim RN, BSN Entered By: Gretta Cool, BSN, RN, CWS, Kim on 05/28/2022 11:44:15 Denard, [name] ([id number]) -------------------------------------------------------------------------------- Wound Assessment Details Patient Name: Esses, Krissie C. Date of Service: 05/28/2022 11:00 AM Medical Record Number: [id number] Patient Account Number: [id number] Date of Birth/Sex: 1961/05/04 (61 y.o. F) Treating RN: Cornell Barman Primary Care Camdyn Laden: Clayborn Heron Other Clinician: Referring Suella Cogar: Card, John Treating Marcellino Fidalgo/Extender: Skipper Cliche in Treatment: 32 Wound Status Wound Number: 1 Primary Etiology: Dehisced Wound Wound Location: Distal, Midline Back Wound Status: Open Wounding Event: Surgical Injury Comorbid History: Hypertension Date Acquired: 04/11/2021 Weeks Of Treatment:  32 Clustered Wound: No Photos Wound Measurements Length: (cm) 0.5 Width: (cm) 0.3 Depth: (cm) 3.4 Area: (cm) 0.118 Volume: (cm) 0.401 % Reduction in Area: 6.3% % Reduction in Volume: 30.6% Epithelialization: None Undermining: No Wound Description Classification: Full Thickness Without Exposed Support Structures Exudate Amount: Medium Exudate Type: Serous Exudate Color: amber Foul Odor After Cleansing: No Slough/Fibrino No Wound Bed Granulation Amount: Large (67-100%) Exposed Structure Granulation Quality: Red Fascia Exposed: No Necrotic Amount: None Present (0%) Fat Layer  (Subcutaneous Tissue) Exposed: Yes Tendon Exposed: No Muscle Exposed: No Joint Exposed: No Bone Exposed: No Treatment Notes Wound #1 (Back) Wound Laterality: Midline, Distal Cleanser Normal Saline Discharge Instruction: Wash your hands with soap and water. Remove old dressing, discard into plastic bag and place into trash. Cleanse the wound with Normal Saline prior to applying a clean dressing using gauze sponges, not tissues or cotton balls. Do not scrub or use excessive force. Pat dry using gauze sponges, not tissue or cotton balls. SYLVESTER, HIGGERSON ([id number]) Peri-Wound Care Topical Primary Dressing Hydrofera Blue Rope Discharge Instruction: cut into fourths; angle the end Secondary Dressing (SILICONE BORDER) Zetuvit Plus SILICONE BORDER Dressing 4x4 (in/in) Secured With Compression Wrap Compression Stockings Environmental education officer) Signed: 05/28/2022 1:58:17 PM By: Gretta Cool, BSN, RN, CWS, Kim RN, BSN Entered By: Gretta Cool, BSN, RN, CWS, Kim on 05/28/2022 11:36:05 Graig, [name] ([id number]) -------------------------------------------------------------------------------- Vitals Details Patient Name: Fiumara, Tashi C. Date of Service: 05/28/2022 11:00 AM Medical Record Number: [id number] Patient Account Number: [id number] Date of Birth/Sex: 1961/08/25 (61 y.o. F) Treating RN: Cornell Barman Primary Care Mayley Lish: Clayborn Heron Other Clinician: Referring Iziah Cates: Card, John Treating Nosson Wender/Extender: Skipper Cliche in Treatment: 32 Vital Signs Time Taken: 11:20 Temperature (F): 97.8 Weight (lbs): 275 Pulse (bpm): 73 Respiratory Rate (breaths/min): 18 Blood Pressure (mmHg): 123/76 Reference Range: 80 - 120 mg / dl Electronic Signature(s) Signed: 05/28/2022 1:58:17 PM By: Gretta Cool, BSN, RN, CWS, Kim RN, BSN Entered By: Gretta Cool, BSN, RN, CWS, Kim on 05/28/2022 11:22:03

## 2022-05-29 ENCOUNTER — Ambulatory Visit: Payer: 59 | Admitting: Occupational Therapy

## 2022-05-29 DIAGNOSIS — I890 Lymphedema, not elsewhere classified: Secondary | ICD-10-CM

## 2022-05-30 ENCOUNTER — Encounter: Payer: 59 | Attending: Physician Assistant

## 2022-05-30 DIAGNOSIS — I10 Essential (primary) hypertension: Secondary | ICD-10-CM | POA: Insufficient documentation

## 2022-05-30 DIAGNOSIS — T8131XA Disruption of external operation (surgical) wound, not elsewhere classified, initial encounter: Secondary | ICD-10-CM | POA: Diagnosis present

## 2022-05-30 DIAGNOSIS — G35 Multiple sclerosis: Secondary | ICD-10-CM | POA: Diagnosis not present

## 2022-05-30 DIAGNOSIS — L98422 Non-pressure chronic ulcer of back with fat layer exposed: Secondary | ICD-10-CM | POA: Diagnosis not present

## 2022-05-30 DIAGNOSIS — X58XXXA Exposure to other specified factors, initial encounter: Secondary | ICD-10-CM | POA: Insufficient documentation

## 2022-05-30 NOTE — Therapy (Signed)
[place] MAIN [facility] SERVICES [address] [place], [name], [address] Phone: [phone]   Fax:  [phone]  Occupational Therapy Treatment  Patient Details  Name: [name] MRN: [id number] Date of Birth: 10-13-1961 Referring Provider (OT): Donella Stade Arringtonm FNP   Encounter Date: 05/29/2022   OT End of Session - 05/29/22 1418     Visit Number 21    Number of Visits 36    Date for OT Re-Evaluation 08/12/22    OT Start Time 0215    OT Stop Time 0315    OT Time Calculation (min) 60 min    Activity Tolerance Patient tolerated treatment well;No increased pain    Behavior During Therapy WFL for tasks assessed/performed             Past Medical History:  Diagnosis Date   Arthritis    Back pain    Chronic knee pain    Edema of both lower extremities    High blood pressure    High cholesterol    Joint pain    Multiple sclerosis ([place])    Neuromuscular disorder (HCC)    Multiple Sclerosis over 20 years   Obesity    Vitamin D deficiency     Past Surgical History:  Procedure Laterality Date   APPENDECTOMY     CESAREAN SECTION     HAND SURGERY     lumbar back surgery     LUMBAR WOUND DEBRIDEMENT N/A 12/05/2020   Procedure: Lumbar Wound Exploration;  Surgeon: Ashok Pall, MD;  Location: [place];  Service: Neurosurgery;  Laterality: N/A;  3C/RM 21   LUMBAR WOUND DEBRIDEMENT N/A 02/21/2021   Procedure: Lumbar wound exploration;  Surgeon: Ashok Pall, MD;  Location: [place];  Service: Neurosurgery;  Laterality: N/A;  posterior   LUMBAR WOUND DEBRIDEMENT N/A 04/11/2021   Procedure: Wound Exploration with Wound Vac Placement;  Surgeon: Ashok Pall, MD;  Location: [place];  Service: Neurosurgery;  Laterality: N/A;   ROTATOR CUFF REPAIR     TONSILLECTOMY      There were no vitals filed for this visit.   Subjective Assessment - 05/29/22 1420     Subjective  [name] presents for OT Rx to address treatment of BLE  lymphedema. Pt denies LE related pain in her legs.Pt presents   in manual transport w/c w custom compression garment in place on the RLE, and multilayer compression wraps on the LLE. Pt has no new concerns by report.    Pertinent History B knee OA, HTN, Obesity, MS, Spondylolisthesis-lumbar, open wound at lumbar surgical site since 4/22.    Limitations difficulty walking, impaired transfers, chronic OA pain in bilateral knees, non healing post surgical lumbar wound, BLE muscle weakness 2/2 MS, chronic leg swelling with minimal pain,    Special Tests - Stemmer bilaterally; Intake FOTO score =31/100    Patient Stated Goals get my legs better so I can do more    Currently in Pain? No/denies    Pain Onset Other (comment)   6 mnths ago without precipitating event. No family hx   Pain Onset Other (comment)   years                         OT Treatments/Exercises (OP) - 05/30/22 1747       ADLs   ADL Education Given Yes      Manual Therapy   Manual Therapy Edema management;Manual Lymphatic Drainage (MLD);Compression Bandaging  Manual Lymphatic Drainage (MLD) MLD to LLE/ LLQ utilizing diaphragmatic breathing to access deep abdominal pathways, short neck sequence and inguinal LN, then proovided MLD from proximal to distal addressing thigh, leg and foot. Foinished up session with short neck x 3 and deep breathing.    Compression Bandaging LLE multilayer compression  wraps frome base of toes to popliteal using gradient techniques                    OT Education - 05/29/22 1748     Education Details Continued Pt/ CG edu for lymphedema self care home program throughout session. Topics include multilayer compression wrapping, simple self-MLD, therapeutic lymphatic pumping exercises, skin/nail care, infection and progression risk reduction factors (LE precautions) , compression garment recommendations and specifications, wear and care schedule and compression garment donning /  doffing w assistive devices. Discussed progress towards all OT goals since commencing CDT. All questions answered to the Pt's satisfaction. Good return.    Person(s) Educated Patient    Methods Explanation;Demonstration;Handout    Comprehension Verbalized understanding;Returned demonstration;Need further instruction                 OT Long Term Goals - 05/20/22 1312       OT LONG TERM GOAL #1   Title Given this patient's Intake score 31 /100 on the functional outcomes FOTO tool, patient will experience an increase in function of 5 points to improve basic and instrumental ADLs performance, including lymphedema self-care.    Baseline 31/100    Period Weeks    Status On-going   TBA next visit   Target Date 08/18/22      OT LONG TERM GOAL #2   Title Pt will demonstrate understanding of lymphedema prevention strategies by identifying and discussing 5 precautions using printed reference (modified assistance) to reduce risk of progression and to limit infection risk.    Baseline Max A    Time 4    Period Days    Status Achieved    Target Date --      OT LONG TERM GOAL #3   Title With Maximum caregiver assistance Pt will be able to apply multilayer, LUE compression wraps using gradient techniques to decrease limb volume, to limit infection risk, and to limit lymphedema progression. Pt unable to wrap legs independently. Trained caregiver must assist during visit intervals for CDT to be effective.    Baseline dependent - Max caregiver assistance is necessary to complete Intensive Phase of Complete Decongestive Therapy and Lymphedema self-care home program  as Pt is unable to reach his feet to apply compression wraps and garments, to inspect skin, to groom nails to perform skin care and inspection and to perform simple self-MLD. Pt agrees to arrange for consistent caregiver prior to commencing CDT.    Time 4    Period Days    Status Achieved   Met and exceeded. Pt is able to apply BLE  multilayer compression wraps using correct gradient techniques with modified independence ( extra time).   Target Date --   4th OT Rx visit     OT LONG TERM GOAL #4   Title With Maximum caregiver assistance between OT sessions Pt will achieve at least a 10% limb volume reduction below the knee bilaterally to return limb to more normal size and shape, to limit infection risk, to decrease pain, to improve function, and to limit lymphedema progression.   Pt has achieved 8% voume reduction to date , which is excellent progress  towards goals.   Baseline dependent    Time 12    Period Weeks    Status Partially Met   RLE A-D volume is reduced by 8.5% since commencing CDT on 3/l/23. LLE is reduced in volume by 5.4% to date. Pt has achieved these reductions without CG assistance.   Target Date 08/18/22      OT LONG TERM GOAL #5   Title With caregiver assistance  Pt will achieve and sustain a least 85% compliance with all 4 LE self-care home program components throughout Intensive Phase CDT, including modified simple self-MLD, daily skin inspection and care, lymphatic pumping the ex, 23/7 compression wraps to optimize limb volume reductions, to limit lymphedema progression and to limit further functional decline.    Baseline Dependent    Time 12    Period Weeks    Status Achieved   Goal met and exceeded. Pt remains > 85% compliant with LE home program   with modified independence (extra time).   Target Date 05/16/22                   Plan - 05/30/22 1747     Clinical Impression Statement Provided MLD and simultaneous skin care to LLE/ LLQ as established. Applied L compression wraps as established Pt remains compliant with aspects of home program she has learned thus far. Fit Pt very pleased with new RLE compression stocking. Fits very well and contains lymphedema appropriately. Cont as per POC.    OT Occupational Profile and History Detailed Assessment- Review of Records and additional review  of physical, cognitive, psychosocial history related to current functional performance    Occupational performance deficits (Please refer to evaluation for details): ADL's;IADL's;Rest and Sleep;Leisure;Social Participation;Work    Writer Several treatment options, min-mod task modification necessary    Comorbidities Affecting Occupational Performance: May have comorbidities impacting occupational performance    Modification or Assistance to Complete Evaluation  Min-Moderate modification of tasks or assist with assess necessary to complete eval    OT Frequency 2x / week    OT Duration 12 weeks    OT Treatment/Interventions Self-care/ADL training;DME and/or AE instruction;Manual lymph drainage;Compression bandaging;Therapeutic activities;Coping strategies training;Therapeutic exercise;Other (comment);Manual Therapy;Energy conservation;Patient/family education   skin care, Flexitouch trial, fit with appropriate compression garments that are comfortable, effective and that Pt is able to don and doff using assistive devices PRN   Recommended Other Services " Calcium channel blockers may not directly impair lymphatic function. However our results show that a reduced lymphatic function predisposes to CBC edema, which may explain why some patients develop edema during treatment." [employer], Niklas Telinius, Claiborne Billings, and Vibeke Hjortdal.  Reduced Lymphatic Function Predisposes to Calcium Channel Blocker Edema: A Randomized Placebo-Controlled Clinical Trial.  Lymphatic Research and Biology.Apr [email or web address]://doi.org/10.1089/lrb.[email or web address]  Published in Volume: 18 Issue 2: April 16, 2019  Online Ahead of Print:August 18, 2018    Consulted and Agree with Plan of Care Patient             Patient will benefit from skilled therapeutic intervention in order to improve the following deficits and impairments:           Visit  Diagnosis: Lymphedema, not elsewhere classified    Problem List Patient Active Problem List   Diagnosis Date Noted   Postoperative seroma of subcutaneous tissue after non-dermatologic procedure 04/11/2021   Wound drainage 02/21/2021   Wound dehiscence 02/21/2021   Postoperative seroma of musculoskeletal structure after musculoskeletal procedure  12/05/2020   Spondylolisthesis of lumbar region 11/02/2020   Insulin resistance 02/15/2020   Class 3 severe obesity with serious comorbidity and body mass index (BMI) of 45.0 to 49.9 in adult [facility]) 01/19/2020   Vitamin D deficiency 01/18/2020   Other hyperlipidemia 02/22/2019   Primary osteoarthritis of both knees 10/14/2018   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   Essential hypertension 02/16/2016   Multiple sclerosis ([place]) 04/06/2013   Andrey Spearman, MS, OTR/L, CLT-LANA 05/30/22 5:49 PM   [place] MAIN [facility] SERVICES [address] [place], [name], [address] Phone: [phone]   Fax:  [phone]  Name: [name] MRN: [id number] Date of Birth: 1961/05/21

## 2022-05-30 NOTE — Patient Instructions (Signed)

## 2022-05-30 NOTE — Progress Notes (Signed)
SHALOM, WARE ([id number]) Visit Report for 05/30/2022 Physician Orders Details Patient Name: [name], [name] Date of Service: 05/30/2022 3:30 PM Medical Record Number: [id number] Patient Account Number: [id number] Date of Birth/Sex: [birth year] (61 y.o. F) Treating RN: Yevonne Pax Primary Care Provider: [name] Other Clinician: Referring Provider: Card, John Treating Provider/Extender: Rowan Blase in Treatment: 37 Verbal / Phone Orders: No Diagnosis Coding Follow-up Appointments o Return Appointment in 1 week. o Nurse Visit as needed Home Health o Home Health Company: [facility] o [facility] for wound care. May utilize formulary equivalent dressing for wound treatment orders unless otherwise specified. Home Health Nurse may visit PRN to address patientos wound care needs. - Monday and Friday [facility] fax [phone] Bathing/ Shower/ Hygiene o May shower; gently cleanse wound with antibacterial soap, rinse and pat dry prior to dressing wounds o No tub bath. Anesthetic (Use 'Patient Medications' Section for Anesthetic Order Entry) o Lidocaine applied to wound bed Edema Control - Lymphedema / Segmental Compressive Device / Other o Elevate, Exercise Daily and Avoid Standing for Long Periods of Time. o Elevate legs to the level of the heart and pump ankles as often as possible o Elevate leg(s) parallel to the floor when sitting. Additional Orders / Instructions o Follow Nutritious Diet and Increase Protein Intake Wound Treatment Wound #1 - Back Wound Laterality: Midline, Distal Cleanser: Normal Saline 1 x Per Day/30 Days Discharge Instructions: Wash your hands with soap and water. Remove old dressing, discard into plastic bag and place into trash. Cleanse the wound with Normal Saline prior to applying a clean dressing using gauze sponges, not tissues or cotton balls. Do not scrub or use excessive force. Pat dry using gauze sponges, not tissue  or cotton balls. Primary Dressing: Hydrofera Blue Rope 1 x Per Day/30 Days Discharge Instructions: cut into fourths; angle the end Secondary Dressing: (SILICONE BORDER) Zetuvit Plus SILICONE BORDER Dressing 4x4 (in/in) 1 x Per Day/30 Days Electronic Signature(s) Signed: 05/30/2022 4:16:41 PM By: Yevonne Pax RN Signed: 05/30/2022 4:22:20 PM By: [name] PA-C Entered By: Yevonne Pax on 05/30/2022 16:16:41 [name], [name] ([id number]) -------------------------------------------------------------------------------- SuperBill Details Patient Name: [name], [name] Date of Service: 05/30/2022 Medical Record Number: [id number] Patient Account Number: [id number] Date of Birth/Sex: [birth year] (61 y.o. 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

## 2022-05-30 NOTE — Progress Notes (Signed)
[name], [name] ([id number]) Visit Report for 05/30/2022 Arrival Information Details Patient Name: [name], [name]. Date of Service: 05/30/2022 3:30 PM Medical Record Number: [id number] Patient Account Number: [id number] Date of Birth/Sex: Dec 01, 1961 (61 y.o. F) Treating RN: Yevonne Pax Primary Care Jonaya Freshour: Card, Jonny Ruiz Other Clinician: Referring Tyreke Kaeser: Card, John Treating Morgana Rowley/Extender: Rowan Blase in Treatment: 32 Visit Information History Since Last Visit All ordered tests and consults were completed: No Patient Arrived: [name] Added or deleted any medications: No Arrival Time: 15:46 Any new allergies or adverse reactions: No Accompanied By: self Had a fall or experienced change in No Transfer Assistance: None activities of daily living that may affect Patient Identification Verified: Yes risk of falls: Secondary Verification Process Completed: Yes Signs or symptoms of abuse/neglect since last visito No Patient Requires Transmission-Based Precautions: No Hospitalized since last visit: No Patient Has Alerts: No Implantable device outside of the clinic excluding No cellular tissue based products placed in the center since last visit: Has Dressing in Place as Prescribed: Yes Pain Present Now: No Electronic Signature(s) Signed: 05/30/2022 4:10:14 PM By: Yevonne Pax RN Entered By: Yevonne Pax on 05/30/2022 16:10:13 Lax, Jmya C. ([id number]) -------------------------------------------------------------------------------- Clinic Level of Care Assessment Details Patient Name: [name], [name] Date of Service: 05/30/2022 3:30 PM Medical Record Number: [id number] Patient Account Number: [id number] Date of Birth/Sex: 29-Aug-1961 (61 y.o. F) Treating RN: Yevonne Pax Primary Care Malay Fantroy: Card, Jonny Ruiz Other Clinician: Referring Ivoree Felmlee: Card, John Treating Hannia Matchett/Extender: Rowan Blase in Treatment: 32 Clinic Level of Care Assessment Items TOOL 4  Quantity Score X - Use when only an EandM is performed on FOLLOW-UP visit 1 0 ASSESSMENTS - Nursing Assessment / Reassessment X - Reassessment of Co-morbidities (includes updates in patient status) 1 10 X- 1 5 Reassessment of Adherence to Treatment Plan ASSESSMENTS - Wound and Skin Assessment / Reassessment X - Simple Wound Assessment / Reassessment - one wound 1 5 []  - 0 Complex Wound Assessment / Reassessment - multiple wounds []  - 0 Dermatologic / Skin Assessment (not related to wound area) ASSESSMENTS - Focused Assessment []  - Circumferential Edema Measurements - multi extremities 0 []  - 0 Nutritional Assessment / Counseling / Intervention []  - 0 Lower Extremity Assessment (monofilament, tuning fork, pulses) []  - 0 Peripheral Arterial Disease Assessment (using hand held doppler) ASSESSMENTS - Ostomy and/or Continence Assessment and Care []  - Incontinence Assessment and Management 0 []  - 0 Ostomy Care Assessment and Management (repouching, etc.) PROCESS - Coordination of Care X - Simple Patient / Family Education for ongoing care 1 15 []  - 0 Complex (extensive) Patient / Family Education for ongoing care []  - 0 Staff obtains , Records, Test Results / Process Orders []  - 0 Staff telephones HHA, Nursing Homes / Clarify orders / etc []  - 0 Routine Transfer to another Facility (non-emergent condition) []  - 0 Routine Hospital Admission (non-emergent condition) []  - 0 New Admissions / / Ordering NPWT, Apligraf, etc. []  - 0 Emergency Hospital Admission (emergent condition) X- 1 10 Simple Discharge Coordination []  - 0 Complex (extensive) Discharge Coordination PROCESS - Special Needs []  - Pediatric / Minor Patient Management 0 []  - 0 Isolation Patient Management []  - 0 Hearing / Language / Visual special needs []  - 0 Assessment of Community assistance (transportation, D/C planning, etc.) []  - 0 Additional assistance / Altered  mentation []  - 0 Support Surface(s) Assessment (bed, cushion, seat, etc.) INTERVENTIONS - Wound Cleansing / Measurement Wirthlin, Satrina C. ( ) X- 1 5 Simple Wound Cleansing -  one wound []  - 0 Complex Wound Cleansing - multiple wounds []  - 0 Wound Imaging (photographs - any number of wounds) []  - 0 Wound Tracing (instead of photographs) X- 1 5 Simple Wound Measurement - one wound []  - 0 Complex Wound Measurement - multiple wounds INTERVENTIONS - Wound Dressings []  - Small Wound Dressing one or multiple wounds 0 X- 1 15 Medium Wound Dressing one or multiple wounds []  - 0 Large Wound Dressing one or multiple wounds []  - 0 Application of Medications - topical []  - 0 Application of Medications - injection INTERVENTIONS - Miscellaneous []  - External ear exam 0 []  - 0 Specimen Collection (cultures, biopsies, blood, body fluids, etc.) []  - 0 Specimen(s) / Culture(s) sent or taken to Lab for analysis []  - 0 Patient Transfer (multiple staff / Nurse, adult / Similar devices) []  - 0 Simple Staple / Suture removal (25 or less) []  - 0 Complex Staple / Suture removal (26 or more) []  - 0 Hypo / Hyperglycemic Management (close monitor of Blood Glucose) []  - 0 Ankle / Brachial Index (ABI) - do not check if billed separately []  - 0 Vital Signs Has the patient been seen at the hospital within the last three years: Yes Total Score: 70 Level Of Care: New/Established - Level 2 Electronic Signature(s) Unsigned Entered ByYevonne Pax on 05/30/2022 16:20:10 Signature(s): Date(s): Deveney, Brunette CMarland Kitchen ([id number]) -------------------------------------------------------------------------------- Encounter Discharge Information Details Patient Name: [name], [name] Date of Service: 05/30/2022 3:30 PM Medical Record Number: [id number] Patient Account Number: [id number] Date of Birth/Sex: 1961-07-17 (61 y.o. F) Treating RN: Yevonne Pax Primary Care Kasen Sako: [name] Other  Clinician: Referring Korinne Greenstein: Card, John Treating Mckyle Solanki/Extender: Rowan Blase in Treatment: 54 Encounter Discharge Information Items Discharge Condition: Stable Ambulatory Status: Ambulatory Discharge Destination: Home Transportation: Private Auto Accompanied By: self Schedule Follow-up Appointment: Yes Clinical Summary of Care: Patient Declined Electronic Signature(s) Signed: 05/30/2022 4:17:14 PM By: Yevonne Pax RN Entered By: Yevonne Pax on 05/30/2022 16:17:14 Bouie, Narya C. ([id number]) -------------------------------------------------------------------------------- Wound Assessment Details Patient Name: Buechner, Venetta C. Date of Service: 05/30/2022 3:30 PM Medical Record Number: [id number] Patient Account Number: [id number] Date of Birth/Sex: May 29, 1961 (61 y.o. F) Treating RN: Yevonne Pax Primary Care Nahomy Limburg: Card, Jonny Ruiz Other Clinician: Referring Demeisha Geraghty: Card, John Treating Million Maharaj/Extender: Rowan Blase in Treatment: 32 Wound Status Wound Number: 1 Primary Etiology: Dehisced Wound Wound Location: Distal, Midline Back Wound Status: Open Wounding Event: Surgical Injury Comorbid History: Hypertension Date Acquired: 04/11/2021 Weeks Of Treatment: 32 Clustered Wound: No Wound Measurements Length: (cm) 0.5 Width: (cm) 0.3 Depth: (cm) 3.4 Area: (cm) 0.118 Volume: (cm) 0.401 % Reduction in Area: 6.3% % Reduction in Volume: 30.6% Epithelialization: None Tunneling: No Undermining: No Wound Description Classification: Full Thickness Without Exposed Support Structu Exudate Amount: Medium Exudate Type: Serous Exudate Color: amber res Foul Odor After Cleansing: No Slough/Fibrino No Wound Bed Granulation Amount: Large (67-100%) Exposed Structure Granulation Quality: Red Fascia Exposed: No Necrotic Amount: None Present (0%) Fat Layer (Subcutaneous Tissue) Exposed: Yes Tendon Exposed: No Muscle Exposed: No Joint Exposed: No Bone Exposed:  No Treatment Notes Wound #1 (Back) Wound Laterality: Midline, Distal Cleanser Normal Saline Discharge Instruction: Wash your hands with soap and water. Remove old dressing, discard into plastic bag and place into trash. Cleanse the wound with Normal Saline prior to applying a clean dressing using gauze sponges, not tissues or cotton balls. Do not scrub or use excessive force. Pat dry using gauze sponges, not tissue or cotton balls. Peri-Wound Care Topical Primary  Dressing Hydrofera Blue Rope Discharge Instruction: cut into fourths; angle the end Secondary Dressing (SILICONE BORDER) Zetuvit Plus SILICONE BORDER Dressing 4x4 (in/in) Secured With Compression Wrap Compression Stockings JACE, DOWE ([id number]) Add-Ons Electronic Signature(s) Signed: 05/30/2022 4:15:57 PM By: Yevonne Pax RN Entered By: Yevonne Pax on 05/30/2022 16:15:57

## 2022-05-31 ENCOUNTER — Ambulatory Visit: Payer: 59 | Attending: Geriatric Medicine | Admitting: Occupational Therapy

## 2022-05-31 DIAGNOSIS — I890 Lymphedema, not elsewhere classified: Secondary | ICD-10-CM | POA: Diagnosis present

## 2022-05-31 NOTE — Therapy (Signed)
[place] MAIN [facility] SERVICES [address] [place], [name], [address] Phone: [phone]   Fax:  [phone]  Occupational Therapy Treatment  Patient Details  Name: [name] MRN: [id number] Date of Birth: 1961/05/22 Referring Provider (OT): Donella Stade Arringtonm FNP   Encounter Date: 05/31/2022   OT End of Session - 05/31/22 1111     Visit Number 22    Number of Visits 36    Date for OT Re-Evaluation 08/12/22    OT Start Time 1100    OT Stop Time 1200    OT Time Calculation (min) 60 min    Activity Tolerance Patient tolerated treatment well;No increased pain    Behavior During Therapy WFL for tasks assessed/performed             Past Medical History:  Diagnosis Date   Arthritis    Back pain    Chronic knee pain    Edema of both lower extremities    High blood pressure    High cholesterol    Joint pain    Multiple sclerosis ([place])    Neuromuscular disorder (HCC)    Multiple Sclerosis over 20 years   Obesity    Vitamin D deficiency     Past Surgical History:  Procedure Laterality Date   APPENDECTOMY     CESAREAN SECTION     HAND SURGERY     lumbar back surgery     LUMBAR WOUND DEBRIDEMENT N/A 12/05/2020   Procedure: Lumbar Wound Exploration;  Surgeon: Ashok Pall, MD;  Location: [place];  Service: Neurosurgery;  Laterality: N/A;  3C/RM 21   LUMBAR WOUND DEBRIDEMENT N/A 02/21/2021   Procedure: Lumbar wound exploration;  Surgeon: Ashok Pall, MD;  Location: [place];  Service: Neurosurgery;  Laterality: N/A;  posterior   LUMBAR WOUND DEBRIDEMENT N/A 04/11/2021   Procedure: Wound Exploration with Wound Vac Placement;  Surgeon: Ashok Pall, MD;  Location: [place];  Service: Neurosurgery;  Laterality: N/A;   ROTATOR CUFF REPAIR     TONSILLECTOMY      There were no vitals filed for this visit.   Subjective Assessment - 05/31/22 1114     Subjective  [name] presents for OT Rx to address treatment of BLE  lymphedema. Pt denies LE related pain in her legs.Pt presents   in manual transport w/c w custom compression garment in place on the RLE, and multilayer compression wraps on the LLE. Pt has no new concerns by report. "I'm getting cortizone shots in my knees on Monday." Pt is in agreement with OT's request for referral to PT, instead if simple screening, in hopes of facilitating increase mobilit and safety.    Pertinent History B knee OA, HTN, Obesity, MS, Spondylolisthesis-lumbar, open wound at lumbar surgical site since 4/22.    Limitations difficulty walking, impaired transfers, chronic OA pain in bilateral knees, non healing post surgical lumbar wound, BLE muscle weakness 2/2 MS, chronic leg swelling with minimal pain,    Special Tests - Stemmer bilaterally; Intake FOTO score =31/100    Patient Stated Goals get my legs better so I can do more    Currently in Pain? No/denies   No LE related pain   Pain Onset Other (comment)   6 mnths ago without precipitating event. No family hx   Pain Onset Other (comment)   years  OT Treatments/Exercises (OP) - 05/31/22 1254       ADLs   ADL Education Given Yes      Manual Therapy   Manual Therapy Edema management;Manual Lymphatic Drainage (MLD);Compression Bandaging    Manual Lymphatic Drainage (MLD) MLD to LLE/ LLQ utilizing diaphragmatic breathing to access deep abdominal pathways, short neck sequence and inguinal LN, then proovided MLD from proximal to distal addressing thigh, leg and foot. Foinished up session with short neck x 3 and deep breathing.    Compression Bandaging LLE multilayer compression  wraps frome base of toes to popliteal using gradient techniques                    OT Education - 05/31/22 1255     Education Details Continued Pt/ CG edu for lymphedema self care home program throughout session. Topics include multilayer compression wrapping, simple self-MLD, therapeutic lymphatic  pumping exercises, skin/nail care, infection and progression risk reduction factors (LE precautions) , compression garment recommendations and specifications, wear and care schedule and compression garment donning / doffing w assistive devices. Discussed progress towards all OT goals since commencing CDT. All questions answered to the Pt's satisfaction. Good return.    Person(s) Educated Patient    Methods Explanation;Demonstration;Handout    Comprehension Verbalized understanding;Returned demonstration;Need further instruction                 OT Long Term Goals - 05/20/22 1312       OT LONG TERM GOAL #1   Title Given this patient's Intake score 31 /100 on the functional outcomes FOTO tool, patient will experience an increase in function of 5 points to improve basic and instrumental ADLs performance, including lymphedema self-care.    Baseline 31/100    Period Weeks    Status On-going   TBA next visit   Target Date 08/18/22      OT LONG TERM GOAL #2   Title Pt will demonstrate understanding of lymphedema prevention strategies by identifying and discussing 5 precautions using printed reference (modified assistance) to reduce risk of progression and to limit infection risk.    Baseline Max A    Time 4    Period Days    Status Achieved    Target Date --      OT LONG TERM GOAL #3   Title With Maximum caregiver assistance Pt will be able to apply multilayer, LUE compression wraps using gradient techniques to decrease limb volume, to limit infection risk, and to limit lymphedema progression. Pt unable to wrap legs independently. Trained caregiver must assist during visit intervals for CDT to be effective.    Baseline dependent - Max caregiver assistance is necessary to complete Intensive Phase of Complete Decongestive Therapy and Lymphedema self-care home program  as Pt is unable to reach his feet to apply compression wraps and garments, to inspect skin, to groom nails to perform skin  care and inspection and to perform simple self-MLD. Pt agrees to arrange for consistent caregiver prior to commencing CDT.    Time 4    Period Days    Status Achieved   Met and exceeded. Pt is able to apply BLE multilayer compression wraps using correct gradient techniques with modified independence ( extra time).   Target Date --   4th OT Rx visit     OT LONG TERM GOAL #4   Title With Maximum caregiver assistance between OT sessions Pt will achieve at least a 10% limb volume reduction below the knee bilaterally  to return limb to more normal size and shape, to limit infection risk, to decrease pain, to improve function, and to limit lymphedema progression.   Pt has achieved 8% voume reduction to date , which is excellent progress towards goals.   Baseline dependent    Time 12    Period Weeks    Status Partially Met   RLE A-D volume is reduced by 8.5% since commencing CDT on 3/l/23. LLE is reduced in volume by 5.4% to date. Pt has achieved these reductions without CG assistance.   Target Date 08/18/22      OT LONG TERM GOAL #5   Title With caregiver assistance  Pt will achieve and sustain a least 85% compliance with all 4 LE self-care home program components throughout Intensive Phase CDT, including modified simple self-MLD, daily skin inspection and care, lymphatic pumping the ex, 23/7 compression wraps to optimize limb volume reductions, to limit lymphedema progression and to limit further functional decline.    Baseline Dependent    Time 12    Period Weeks    Status Achieved   Goal met and exceeded. Pt remains > 85% compliant with LE home program   with modified independence (extra time).   Target Date 05/16/22                   Plan - 05/31/22 1112     Clinical Impression Statement Continued MLD and simultaneous skin care to LLE/ LLQ as established. Reapplied compression wraps as established. Pt cont to make steady progress towards all OT goals for LE care. Pt demonstrates  excellent compliance with R custom compression garment. Cont as  POC.    OT Occupational Profile and History Detailed Assessment- Review of Records and additional review of physical, cognitive, psychosocial history related to current functional performance    Occupational performance deficits (Please refer to evaluation for details): ADL's;IADL's;Rest and Sleep;Leisure;Social Participation;Work    Writer Several treatment options, min-mod task modification necessary    Comorbidities Affecting Occupational Performance: May have comorbidities impacting occupational performance    Modification or Assistance to Complete Evaluation  Min-Moderate modification of tasks or assist with assess necessary to complete eval    OT Frequency 2x / week    OT Duration 12 weeks    OT Treatment/Interventions Self-care/ADL training;DME and/or AE instruction;Manual lymph drainage;Compression bandaging;Therapeutic activities;Coping strategies training;Therapeutic exercise;Other (comment);Manual Therapy;Energy conservation;Patient/family education   skin care, Flexitouch trial, fit with appropriate compression garments that are comfortable, effective and that Pt is able to don and doff using assistive devices PRN   Recommended Other Services " Calcium channel blockers may not directly impair lymphatic function. However our results show that a reduced lymphatic function predisposes to CBC edema, which may explain why some patients develop edema during treatment." [employer], Niklas Telinius, Claiborne Billings, and Vibeke Hjortdal.  Reduced Lymphatic Function Predisposes to Calcium Channel Blocker Edema: A Randomized Placebo-Controlled Clinical Trial.  Lymphatic Research and Biology.Apr [email or web address]  Published in Volume: 18 Issue 2: April 16, 2019  Online Ahead of Print:August 18, 2018    Consulted and Agree with Plan of Care Patient              Patient will benefit from skilled therapeutic intervention in order to improve the following deficits and impairments:           Visit Diagnosis: Lymphedema, not elsewhere classified    Problem List Patient Active Problem List   Diagnosis Date Noted  Postoperative seroma of subcutaneous tissue after non-dermatologic procedure 04/11/2021   Wound drainage 02/21/2021   Wound dehiscence 02/21/2021   Postoperative seroma of musculoskeletal structure after musculoskeletal procedure 12/05/2020   Spondylolisthesis of lumbar region 11/02/2020   Insulin resistance 02/15/2020   Class 3 severe obesity with serious comorbidity and body mass index (BMI) of 45.0 to 49.9 in adult [facility]) 01/19/2020   Vitamin D deficiency 01/18/2020   Other hyperlipidemia 02/22/2019   Primary osteoarthritis of both knees 10/14/2018   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 [place], [name], [address] Phone: [phone]   Fax:  [phone]  Name: [name] MRN: [id number] Date of Birth: 07/17/1961

## 2022-06-04 ENCOUNTER — Ambulatory Visit: Payer: 59 | Admitting: Occupational Therapy

## 2022-06-04 ENCOUNTER — Encounter: Payer: 59 | Admitting: Physician Assistant

## 2022-06-04 DIAGNOSIS — T8131XA Disruption of external operation (surgical) wound, not elsewhere classified, initial encounter: Secondary | ICD-10-CM | POA: Diagnosis not present

## 2022-06-04 NOTE — Progress Notes (Signed)
[name] ([id number]) Visit Report for 06/04/2022 Chief Complaint Document Details Patient Name: [name], [name] Date of Service: 06/04/2022 1:15 PM Medical Record Number: [id number] Patient Account Number: [id number] Date of Birth/Sex: [birth year] (60 y.o. F) Treating RN: Yevonne Pax Primary Care Provider: [name] Other Clinician: Referring Provider: Card, John Treating Provider/Extender: Rowan Blase in Treatment: 65 Information Obtained from: Patient Chief Complaint Surgical Back Ulcer Electronic Signature(s) Signed: 06/04/2022 1:52:14 PM By: [name] PA-[name] Entered By: [name] on 06/04/2022 13:52:14 Caiazzo, Kandie CMarland Kitchen ([id number]) -------------------------------------------------------------------------------- HPI Details Patient Name: [name] Date of Service: 06/04/2022 1:15 PM Medical Record Number: [id number] Patient Account Number: [id number] Date of Birth/Sex: [birth year] (61 y.o. 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 This was actually broken up into 3 separate surgical events. The initial surgical intervention actually was on November 05, 2021 almost a year ago. Subsequently the patient went back in February for a seroma of the area which unfortunately required her to have a repeat surgery to go in and clean this out. And then again this occurred in April where she went back in and again they felt like stitches were coming out and there was an additional seroma. She was placed in a wound VAC initially and then subsequently as it got smaller that was  discontinued. Again right now I will see anything that I think a wound VAC would help with. Nonetheless she definitely has a significant depth to the wound that is going require packing. I actually believe the Hydrofera Blue rope would probably do quite well with this the problem is as much as it is draining she probably needs this to be changed at least every day. She does not really have anyone that can help with that that is the complicating scenario here. With that being said the patient does have a history of multiple sclerosis, hypertension, and again this surgical wound dehiscence in regard to her lumbar spine region. She did have a repeat MRI which was actually completed 10/09/2021. This showed that there was no significant change in the subcutaneous fluid collection/track of the lower lumbar region. This is extending to the level of the fascia unfortunately. This seems to go all the way from the L2 level with a track extending all the way to the fascia at the L4-5 level. Again this is a significant wound and there is significant drainage but does not seem to communicate to the spinal region as far as spinal fluid or otherwise is concerned that is good news. Nonetheless she last saw Dr. Franky Macho who is her neurosurgeon on 10/01/2021 that was when he ordered this last MRI she supposed to see him next week as well. With that being said he did not feel like there was any significant issue there but was not sure why this was not healing that is when he ordered the MRI. They were wanting to make sure that this was packed appropriately by home health unfortunately the main issue currently is that home health is completely out of the picture as the patient has exhausted all the home health that that she gets for a year. She is now in a very difficult  predicament where she does not have anyone to help her change the dressing and to be honest that she is not able to do it herself with the location of the  wound being on the midline lumbar spine region. If she does not have anyone that can help it is probably can to be necessary for her to go to a facility for rehab and daily dressing changes as I feel like daily changes which is much drainage that she is having is going to be necessary. 10/23/2021 upon evaluation today patient appears to be doing decently well in regard to her back ulcer. This does seem to be draining a lot less than what it is been doing in the past. With that being said she still has quite a bit of drainage nonetheless. I do think that given time this should improve least I hope so. The good news is she does have home health coming out 3 days a week were doing it 2 days a week and she is paying someone we can to help. 10/30/2021 upon evaluation today patient appears to be doing okay in regard to her back ulcer this is not draining quite as bad as it was in the beginning but he still has quite a bit of drainage noted. I do believe that the patient would benefit from [place] going ahead forward with attempting a wound VAC using the Hydrofera Blue rope to pack with and then subsequently using the VAC externally to actually suction out and help this to fill- in. I think this is our ideal way to try to get things cleared at this point. As it stands I am not certain that we are really making a progress that we want to see near with doing it in the way we are which is packing with the rope. It is a good dressing but I do think it is insufficient for total healing. She just seems to have too much in the way of drainage at this point unfortunately. 11/06/2021 upon evaluation today patient unfortunately continues to have issues with her back ongoing. The good news is her MRI that was repeated showed signs of the size of this area in the lumbar spine region having decreased from 4 cm to 3.5 cm this is definitely not bad news at all. With that being said unfortunately she continues to have issues with  ongoing drainage not as severe as in the beginning but nonetheless still significant. I do think a wound VAC still would be a good way to go although her home health agency nurse apparently has some concerns about the possibility of not being able to keep a seal with this as they had struggles in the past. Nonetheless I explained to the patient that this is much different than what she had previous and that I really feel like it would do much better as far as getting the area taken care of without having any complications or issues here. I think that we should be able to maintain a seal. Nonetheless at this time I did discuss with the patient as well that she probably does need to have a wound VAC in order for [place] to get this moving in the right direction. 11/13/2021 upon evaluation patient's wound bed actually showed signs of significant drainage at this time. She did see the surgeon yesterday he did not see anything that appeared to be infected. Nonetheless he does appear that she is continuing to have areas here that just do not seem  to want to seal up there MRI findings have been negative but nonetheless she continues to have is the seroma that is filling in. I do feel like we need to try to widen the hole so we can get at least a half of the [facility] then this will be better than nothing at this point. 11/20/2021 upon evaluation today patient actually appears to be having less pain at this point which is good news and overall she we still do not have the results of the culture back yet it had to be sent out to [facility] and we do not have the result back yet. Is doing decently well in regard to her wound. Fortunately there does not appear to be any signs of active infection systemically nor locally at this time. 11/27/2021 upon evaluation today patient appears to be doing well with regard to her wound all things considered there does appear to be less drainage than there was previous.  Fortunately I do not see any evidence of worsening of the patient is stating that she is having some issues with back pain. This is somewhat new. Again this I think could be related to the fact that she is having some issues here with infection. We are still waiting to see what the result of her culture shows from susceptibility testing Enterococcus has been identified but we do not know if this is VRE or not. 12/04/2021 upon evaluation today patient appears to be doing well with regard to her wound all things considered. I did have a conversation with Erin from Dr. Sueanne Margarita office. Of note she notes that Dr. Drinda Butts really does not want to do anything surgical right now which I completely understand. With that being said I am still leery of how far we will make it getting this to heal short of any type of surgery to open this up and allow [place] to more appropriately packed the wound. Nonetheless I will absolutely give it our best shot as far as that is concerned. I discussed that with the patient today. She voiced understanding. The good news is the drainage today seems to be clear it is no longer cloudy as it was previous I am actually very pleased in that regard. SHAQUANTA, HARKLESS ([id number]) 12/11/2021 since have last seen the patient actually did have an conversation with Dr. Franky Macho with her neurosurgeon. Again this involve the discussion around whether or not to open the wound and try to apply a wound VAC following. With that being said the decision was made that that may be the best thing to do if the patient was in agreement. Nonetheless I am actually extremely encouraged with what I am seeing today much more than I would have thought. In fact I think that we may be over packing the wound which is why she is having some discomfort at this point as there is much less of the dressing able to get and this time compared to what we saw previous. That is actually really good news. In fact the 6:00  tunnel appears to have filled in is awesome news. At the 12:00 location I am actually able to pack into that area pretty effectively at this point today. In fact I was able to get less of the packing in which I will detail below. 12/18/2021 upon evaluation today patient appears to be doing well with regard to her wound on the back. Fortunately there is no signs of active infection which is great news and overall  very pleased with where things stand today. No fevers, chills, nausea, vomiting, or diarrhea. 01/01/2022 upon evaluation today patient appears to be doing decently well in regard to her wound. Fortunately there does not appear to be any signs of anything worsening which is great news. She still continues to have issues with drainage but this is not nearly as significant as what it was in the past. There is all clear drainage no signs of purulence noted. 01/08/2022 upon evaluation today patient appears to be doing well with regard to her wound. With that being said she tells me that she has been having some increased pain over the past week. It does appear based on the amount of Hydrofera Blue that we removed this is probably over pack. Again it is difficult to know exactly how old the nurses getting all the skin but nonetheless the length of Hydrofera Blue that was utilized was way too much which may be part of the reason why this felt so uncomfortable. Fortunately I think that we can cut back on that we discussed pieces smaller so hopefully that will not be over packed. 01/22/2021 upon evaluation today patient appears to be doing well With regard to there being no signs of infection at this time. The wound does still tunnel mainly up at the 12:00 location. The depth of the tunnel complete from entry point to the base is about 3.5 cm today. With that being said I do believe that overall she is making good progress this is just a very slow process for her which I know has been frustrating as  well. 01/29/2022 upon evaluation today patient appears to be doing well with regard to the wound on her lower back and the lumbar spine region. The good news is the depth that I got this week was a little bit less going up at the 12:00 location as compared to last week. I measured this to be 3.5 cm last week and it was 3.3 cm this week. Again that is a minute shift but nonetheless a good shift in the right direction. Overall I think that we are on the right track. 02/05/2022 upon evaluation today patient appears to be doing well with regard to her wound. In fact right now her measurements are somewhere around 2.9 cm which is definitely smaller and less deep than last week At the 12:00 location. Overall I am very pleased with what we are seeing. 02/19/2022 upon evaluation today patient unfortunately is noting that the wound is actually closing up externally but internally there is still some depth to this. I discussed with her today that we cannot let it close up like this is can end up being a significant issue for her if we allow for that. Subsequently we need to do what we can to try to open this up and keep it open more effectively. She voiced understanding. With that being said we are going to proceed with that procedure today in order to debride away some of the skin on externally that is trying to close and on this. 02/26/2022 upon evaluation patient appears to be doing better in my opinion after we had to open up this area last week. Again she has a significant amount of healing and overall I am extremely pleased with where things stand currently. I do not see any evidence of active infection locally nor systemically at this time which is great news and in general I think that we are on the right track for getting  this hopefully closed final and done. 03/05/2022 upon evaluation today patient unfortunately is doing a little bit worse with regard to the whole size this is starting to get much  smaller unfortunately. There does not appear to be any signs of active infection locally nor systemically which is great news. With that being said I am concerned about the fact that the patient is doing quite a bit worse with regard to trapping some fluid and almost feels like she may have a fluid pocket that not only goes in and up towards 12:00 but looks back around towards the more superficial subcutaneous tissue. I am very concerned that this is going to be something that is very hard for [place] to heal short of another surgery to open this up and try to wound VAC from the inside out. Even then there is no guarantees this is just a very difficult situation to be perfectly honest. 03/14/2022 upon evaluation today patient appears to be doing well with regard to her wound as far as the area staying open and pleased in that regard. With that being said unfortunately she is not doing as well when it comes to the irritation around it appears to be very inflamed and red this is not good that was discussed with her today as well. Subsequently I do believe that working to need to do what we can do to try and calm this down in the past she did well with the Augmentin due to Enterococcus infection I am not sure if were dealing with the same thing or not but at the same time I think that is probably to be my go to at this point. 03/19/2022 upon evaluation today patient appears to be doing really about the same in regard to her wound that the pain is better. 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 She seems to be improved dramatically with the antibiotic currently although it does not cover for MRSA I am apt to just see how this progresses over the next several days to a week. With that being said the bigger issue here is that we simply do not seem to be making excellent progress and I am concerned about the lack of progress. I discussed with Dr. Shon Baton with [facility] in [place]  and to be honest his recommendation was that the best bet would probably be to get her to a teaching center. She is actually been seen for rheumatology and a I believe psychologist/psychiatrist at [facility]. She would prefer to go that direction as opposed to [facility] or [facility]. I am definitely okay with that. 03/26/2022 upon evaluation today patient appears to be doing okay in regard to her wound. She is not showing signs of worsening and overall she tells me that her pain is not nearly as bad as it was previous. Fortunately I do not see any evidence of active infection locally or systemically which is great news. No fevers, chills, nausea, vomiting, or diarrhea. 04-02-2022 upon evaluation today patient appears to be doing well with regard to her wound. It does not appear to be showing any signs of worsening which is great news. With that being said you are still having some issues here with drainage although it is clear and mainly just tenderness with bleeding from the [facility] I do think that treating for the second issue which is MRSA is probably the right thing to do at this point she is now off of the Augmentin. 04-09-2022 upon  evaluation today patient's wound actually appears to be doing about the same. Fortunately I do not see any evidence of active infection locally or systemically at this time which is great news. No fevers, chills, nausea, vomiting, or diarrhea. 04-16-2022 upon evaluation today patient appears to be doing well with regard to her wound in the lumbar spine region. Subsequently she continues to have an area that does have a tunnel up into the 12:00 location. This is about 3.5 cm using a skinny probe curved upwards to get into this region. With that being said I really do not see any signs of overall improvement but also do not see any signs of worsening I feel like work on about a still made here to some degree. The patient did see her neurologist and he has referred her to  neurosurgery at [facility] for second ULYANA, HARDBARGER. ([phone]:[id number]) opinion we will see what they have to say as well. 04-23-2022 upon evaluation today patient appears to be doing well with regard to her wound all things considered. She has been tolerating the dressing changes without complication. Fortunately I do not see any signs of active infection locally nor systemically which is great news. No fever chills noted 04-30-2022 upon evaluation today patient appears to be doing about the same in regard to her wound. The depth is really not dramatically improved as far as the 12:00 tunnel is concerned. The overall depth and the base of the wound does seem to be a little bit better it does sound like that external wound has closed as far as the opening is concerned we can have to cut this down from a half to a smaller size based on what we are seeing today. 05-07-2022 upon evaluation today patient's wound actually seems to be doing decently well. There is not a major shift here but a slight shift in the depth both straight and as well as in the [place] at 12:00. With that being said I again we will talk about a couple of millimeters but still every little bit can count when there are situations like this to be honest. 05-14-2022 upon evaluation today patient appears to be doing okay in regard to her wound. I do not see any signs of anything worsening. With that being said also do not see getting significantly better unfortunately. There does not appear to be any evidence of infection locally or systemically which is great news. No fevers, chills, nausea, vomiting, or diarrhea. 05-21-2022 upon evaluation today patient actually appears to be doing quite well in regard to her wound from the standpoint of there being no infection. With that being said there does not appear to be any evidence of infection locally nor systemically which is great news. No fevers, chills, nausea, vomiting, or diarrhea. 05-28-2022  upon evaluation today patient's wound actually appears to be doing about the same. I do not see any evidence of active infection locally or systemically which is great news. No fevers, chills, nausea, vomiting, or diarrhea. 06-04-2022 upon evaluation today patient appears to be doing well with regard to her wound. She has been tolerating dressing changes without complication. Fortunately I do not feel like there is any worsening also I do not feel like there is any improvement. I feel like right a solid area here where she has a tunnel that tracks up at 12:00 and then has a fluid pocket at around roughly 1:00 I can push on this and squeeze fluid out. Again I am not exactly sure  what else can be done from my standpoint to improve this. She does have an appointment with Dr. Franky Macho who I do believe is an excellent surgeon and this is the surgeon who performed this surgery initially. Again the biggest issue was following she had a lot of trouble with the wound VAC I do think that we need to try to see if she is can have a wound VAC following surgery what exactly regular need to do to help keep this moving in the right direction for her. Obviously I want her to have the best result possible that she has to go through surgery again to clean this area out. Absolutely willing to help out with getting this to heal. Electronic Signature(s) Signed: 06/04/2022 2:26:27 PM By: [name] PA-[name] Entered By: [name] on 06/04/2022 14:26:27 Lewinski, Nazly Salena Saner ([id number]) -------------------------------------------------------------------------------- Physician Orders Details Patient Name: Garciaperez, Shawnae [name]. Date of Service: 06/04/2022 1:15 PM Medical Record Number: [id number] Patient Account Number: [id number] Date of Birth/Sex: [birth year] (61 y.o. F) Treating RN: Yevonne Pax Primary Care Provider: [name] Other Clinician: Referring Provider: Card, John Treating Provider/Extender: Rowan Blase  in Treatment: 31 Verbal / Phone Orders: No Diagnosis Coding Follow-up Appointments o Return Appointment in 1 week. o Nurse Visit as needed Home Health o Home Health Company: [facility] o [facility] for wound care. May utilize formulary equivalent dressing for wound treatment orders unless otherwise specified. Home Health Nurse may visit PRN to address patientos wound care needs. - Monday and Friday [facility] fax [phone] Bathing/ Shower/ Hygiene o May shower; gently cleanse wound with antibacterial soap, rinse and pat dry prior to dressing wounds o No tub bath. Anesthetic (Use 'Patient Medications' Section for Anesthetic Order Entry) o Lidocaine applied to wound bed Edema Control - Lymphedema / Segmental Compressive Device / Other o Elevate, Exercise Daily and Avoid Standing for Long Periods of Time. o Elevate legs to the level of the heart and pump ankles as often as possible o Elevate leg(s) parallel to the floor when sitting. Additional Orders / Instructions o Follow Nutritious Diet and Increase Protein Intake Wound Treatment Wound #1 - Back Wound Laterality: Midline, Distal Cleanser: Normal Saline 1 x Per Day/30 Days Discharge Instructions: Wash your hands with soap and water. Remove old dressing, discard into plastic bag and place into trash. Cleanse the wound with Normal Saline prior to applying a clean dressing using gauze sponges, not tissues or cotton balls. Do not scrub or use excessive force. Pat dry using gauze sponges, not tissue or cotton balls. Primary Dressing: Hydrofera Blue Rope 1 x Per Day/30 Days Discharge Instructions: cut into fourths; angle the end Secondary Dressing: (SILICONE BORDER) Zetuvit Plus SILICONE BORDER Dressing 4x4 (in/in) 1 x Per Day/30 Days Electronic Signature(s) Unsigned Entered ByYevonne Pax on 06/04/2022 14:00:21 Signature(s): Date(s): Marcucci, Kyani [name].  ([id number]) -------------------------------------------------------------------------------- Problem List Details Patient Name: Rohlfs, Gerri [name]. Date of Service: 06/04/2022 1:15 PM Medical Record Number: [id number] Patient Account Number: [id number] Date of Birth/Sex: Aug 29, 1961 (61 y.o. 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 Date of Service: 06/04/2022 Medical Record Number: [id number] Patient Account Number: [id number] Date of Birth/Sex: Jun 17, 1961 (61 y.o. 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

## 2022-06-04 NOTE — Progress Notes (Signed)
[name], [name] ([id number]) Visit Report for 06/04/2022 Arrival Information Details Patient Name: [name], [name] Date of Service: 06/04/2022 1:15 PM Medical Record Number: [id number] Patient Account Number: [id number] Date of Birth/Sex: April 01, 1961 (61 y.o. F) Treating RN: Yevonne Pax Primary Care Soffia Doshier: Card, Jonny Ruiz Other Clinician: Referring Mahari Vankirk: Card, John Treating Ouida Abeyta/Extender: Rowan Blase in Treatment: 32 Visit Information History Since Last Visit All ordered tests and consults were completed: No Patient Arrived: [name] Added or deleted any medications: No Arrival Time: 13:39 Any new allergies or adverse reactions: No Accompanied By: self Had a fall or experienced change in No Transfer Assistance: None activities of daily living that may affect Patient Identification Verified: Yes risk of falls: Secondary Verification Process Completed: Yes Signs or symptoms of abuse/neglect since last visito No Patient Requires Transmission-Based Precautions: No Hospitalized since last visit: No Patient Has Alerts: No Implantable device outside of the clinic excluding No cellular tissue based products placed in the center since last visit: Has Dressing in Place as Prescribed: Yes Pain Present Now: No Electronic Signature(s) Signed: 06/04/2022 5:00:48 PM By: Yevonne Pax RN Entered By: Yevonne Pax on 06/04/2022 13:44:24 Apachito, Sander CMarland Kitchen ([id number]) -------------------------------------------------------------------------------- Clinic Level of Care Assessment Details Patient Name: [name], [name] Date of Service: 06/04/2022 1:15 PM Medical Record Number: [id number] Patient Account Number: [id number] Date of Birth/Sex: 1961-08-14 (61 y.o. F) Treating RN: Yevonne Pax Primary Care Lofton Leon: Card, Jonny Ruiz Other Clinician: Referring Demarie Hyneman: Card, John Treating Elsie Baynes/Extender: Rowan Blase in Treatment: 33 Clinic Level of Care Assessment Items TOOL 4  Quantity Score X - Use when only an EandM is performed on FOLLOW-UP visit 1 0 ASSESSMENTS - Nursing Assessment / Reassessment X - Reassessment of Co-morbidities (includes updates in patient status) 1 10 X- 1 5 Reassessment of Adherence to Treatment Plan ASSESSMENTS - Wound and Skin Assessment / Reassessment X - Simple Wound Assessment / Reassessment - one wound 1 5  - 0 Complex Wound Assessment / Reassessment - multiple wounds  - 0 Dermatologic / Skin Assessment (not related to wound area) ASSESSMENTS - Focused Assessment  - Circumferential Edema Measurements - multi extremities 0  - 0 Nutritional Assessment / Counseling / Intervention  - 0 Lower Extremity Assessment (monofilament, tuning fork, pulses)  - 0 Peripheral Arterial Disease Assessment (using hand held doppler) ASSESSMENTS - Ostomy and/or Continence Assessment and Care  - Incontinence Assessment and Management 0  - 0 Ostomy Care Assessment and Management (repouching, etc.) PROCESS - Coordination of Care X - Simple Patient / Family Education for ongoing care 1 15  - 0 Complex (extensive) Patient / Family Education for ongoing care  - 0 Staff obtains Chiropractor, Records, Test Results / Process Orders  - 0 Staff telephones HHA, Nursing Homes / Clarify orders / etc  - 0 Routine Transfer to another Facility (non-emergent condition)  - 0 Routine Hospital Admission (non-emergent condition)  - 0 New Admissions / Manufacturing engineer / Ordering NPWT, Apligraf, etc.  - 0 Emergency Hospital Admission (emergent condition) X- 1 10 Simple Discharge Coordination  - 0 Complex (extensive) Discharge Coordination PROCESS - Special Needs  - Pediatric / Minor Patient Management 0  - 0 Isolation Patient Management  - 0 Hearing / Language / Visual special needs  - 0 Assessment of Community assistance (transportation, D/C planning, etc.)  - 0 Additional assistance / Altered  mentation  - 0 Support Surface(s) Assessment (bed, cushion, seat, etc.) INTERVENTIONS - Wound Cleansing / Measurement [name], [name] ([id number]) X- 1 5 Simple Wound Cleansing -  one wound []  - 0 Complex Wound Cleansing - multiple wounds X- 1 5 Wound Imaging (photographs - any number of wounds) []  - 0 Wound Tracing (instead of photographs) X- 1 5 Simple Wound Measurement - one wound []  - 0 Complex Wound Measurement - multiple wounds INTERVENTIONS - Wound Dressings X - Small Wound Dressing one or multiple wounds 1 10 []  - 0 Medium Wound Dressing one or multiple wounds []  - 0 Large Wound Dressing one or multiple wounds []  - 0 Application of Medications - topical []  - 0 Application of Medications - injection INTERVENTIONS - Miscellaneous []  - External ear exam 0 []  - 0 Specimen Collection (cultures, biopsies, blood, body fluids, etc.) []  - 0 Specimen(s) / Culture(s) sent or taken to Lab for analysis []  - 0 Patient Transfer (multiple staff / Nurse, adult / Similar devices) []  - 0 Simple Staple / Suture removal (25 or less) []  - 0 Complex Staple / Suture removal (26 or more) []  - 0 Hypo / Hyperglycemic Management (close monitor of Blood Glucose) []  - 0 Ankle / Brachial Index (ABI) - do not check if billed separately X- 1 5 Vital Signs Has the patient been seen at the hospital within the last three years: Yes Total Score: 75 Level Of Care: New/Established - Level 2 Electronic Signature(s) Signed: 06/04/2022 5:00:48 PM By: Yevonne Pax RN Entered By: Yevonne Pax on 06/04/2022 14:01:38 Bera, Goldy CMarland Kitchen ([id number]) -------------------------------------------------------------------------------- Encounter Discharge Information Details Patient Name: [name], [name] Date of Service: 06/04/2022 1:15 PM Medical Record Number: [id number] Patient Account Number: [id number] Date of Birth/Sex: 21-Nov-1961 (61 y.o. F) Treating RN: Yevonne Pax Primary Care Zarian Colpitts: [name] Other Clinician: Referring Lenzy Kerschner: Card, John Treating Martrice Apt/Extender: Rowan Blase in Treatment: 51 Encounter Discharge Information Items Discharge Condition: Stable Ambulatory Status: Ambulatory Discharge Destination: Home Transportation: Private Auto Accompanied By: self Schedule Follow-up Appointment: Yes Clinical Summary of Care: Patient Declined Electronic Signature(s) Signed: 06/04/2022 5:00:48 PM By: Yevonne Pax RN Entered By: Yevonne Pax on 06/04/2022 14:02:37 Kerekes, Diamone C. ([id number]) -------------------------------------------------------------------------------- Lower Extremity Assessment Details Patient Name: Ozer, Luverna C. Date of Service: 06/04/2022 1:15 PM Medical Record Number: [id number] Patient Account Number: [id number] Date of Birth/Sex: [birth year] (61 y.o. F) Treating RN: Yevonne Pax Primary Care Lawana Hartzell: [name] Other Clinician: Referring Chevy Sweigert: Card, John Treating Merlin Golden/Extender: Rowan Blase in Treatment: 33 Electronic Signature(s) Signed: 06/04/2022 5:00:48 PM By: Yevonne Pax RN Entered By: Yevonne Pax on 06/04/2022 13:49:12 Misiaszek, Vi CMarland Kitchen ([id number]) -------------------------------------------------------------------------------- Multi Wound Chart Details Patient Name: Wickliff, Brittanee C. Date of Service: 06/04/2022 1:15 PM Medical Record Number: [id number] Patient Account Number: [id number] Date of Birth/Sex: 1961-02-12 (61 y.o. F) Treating RN: Yevonne Pax Primary Care Evetta Renner: [name] Other Clinician: Referring Kamilah Correia: Card, John Treating Jayleana Colberg/Extender: Rowan Blase in Treatment: 33 Vital Signs Height(in): Pulse(bpm): 72 Weight(lbs): 275 Blood Pressure(mmHg): 146/89 Body Mass Index(BMI): Temperature(F): 98.3 Respiratory Rate(breaths/min): 18 Photos: [N/A:N/A] Wound Location: Distal, Midline Back N/A N/A Wounding Event: Surgical Injury N/A N/A Primary Etiology: Dehisced Wound N/A  N/A Comorbid History: Hypertension N/A N/A Date Acquired: 04/11/2021 N/A N/A Weeks of Treatment: 33 N/A N/A Wound Status: Open N/A N/A Wound Recurrence: No N/A N/A Measurements L x W x D (cm) 0.4x0.3x2.5 N/A N/A Area (cm) : 0.094 N/A N/A Volume (cm) : 0.236 N/A N/A % Reduction in Area: 25.40% N/A N/A % Reduction in Volume: 59.20% N/A N/A Classification: Full Thickness Without Exposed N/A N/A Support Structures Exudate Amount: Medium N/A N/A Exudate Type: Serous N/A N/A Exudate  Color: amber N/A N/A Granulation Amount: Large (67-100%) N/A N/A Granulation Quality: Red N/A N/A Necrotic Amount: None Present (0%) N/A N/A Exposed Structures: Fat Layer (Subcutaneous Tissue): N/A N/A Yes Fascia: No Tendon: No Muscle: No Joint: No Bone: No Epithelialization: None N/A N/A Treatment Notes Electronic Signature(s) Signed: 06/04/2022 5:00:48 PM By: Yevonne Pax RN Entered By: Yevonne Pax on 06/04/2022 13:49:26 Alvira, Almarie Salena Saner ([id number]) -------------------------------------------------------------------------------- Multi-Disciplinary Care Plan Details Patient Name: Finazzo, Lian C. Date of Service: 06/04/2022 1:15 PM Medical Record Number: [id number] Patient Account Number: [id number] Date of Birth/Sex: 1961/03/11 (61 y.o. F) Treating RN: Yevonne Pax Primary Care Dmarius Reeder: [name] Other Clinician: Referring Rithika Seel: Card, John Treating Patryk Conant/Extender: Rowan Blase in Treatment: 21 Active Inactive Wound/Skin Impairment Nursing Diagnoses: Knowledge deficit related to ulceration/compromised skin integrity Goals: Patient/caregiver will verbalize understanding of skin care regimen Date Initiated: 10/15/2021 Target Resolution Date: 06/15/2022 Goal Status: Active Ulcer/skin breakdown will have a volume reduction of 30% by week 4 Date Initiated: 10/15/2021 Date Inactivated: 01/29/2022 Target Resolution Date: 12/15/2021 Goal Status: Unmet Unmet Reason:  comorbities Ulcer/skin breakdown will have a volume reduction of 50% by week 8 Date Initiated: 10/15/2021 Date Inactivated: 01/29/2022 Target Resolution Date: 01/15/2022 Goal Status: Unmet Unmet Reason: comorbities Ulcer/skin breakdown will have a volume reduction of 80% by week 12 Date Initiated: 10/15/2021 Date Inactivated: 02/19/2022 Target Resolution Date: 02/15/2022 Goal Status: Unmet Unmet Reason: comorbities Ulcer/skin breakdown will heal within 14 weeks Date Initiated: 10/15/2021 Date Inactivated: 05/07/2022 Target Resolution Date: 03/15/2022 Goal Status: Unmet Unmet Reason: comorbities Interventions: Assess patient/caregiver ability to obtain necessary supplies Assess patient/caregiver ability to perform ulcer/skin care regimen upon admission and as needed Assess ulceration(s) every visit Notes: Electronic Signature(s) Signed: 06/04/2022 5:00:48 PM By: Yevonne Pax RN Entered By: Yevonne Pax on 06/04/2022 13:49:17 Montella, Maci C. ([id number]) -------------------------------------------------------------------------------- Pain Assessment Details Patient Name: Arkin, Ama C. Date of Service: 06/04/2022 1:15 PM Medical Record Number: [id number] Patient Account Number: [id number] Date of Birth/Sex: 1961/05/06 (61 y.o. F) Treating RN: Yevonne Pax Primary Care Yuli Lanigan: [name] Other Clinician: Referring Meila Berke: Card, John Treating Quita Mcgrory/Extender: Rowan Blase in Treatment: 33 Active Problems Location of Pain Severity and Description of Pain Patient Has Paino No Site Locations Pain Management and Medication Current Pain Management: Electronic Signature(s) Signed: 06/04/2022 5:00:48 PM By: Yevonne Pax RN Entered By: Yevonne Pax on 06/04/2022 13:47:12 Vandewater, Issis Salena Saner ([id number]) -------------------------------------------------------------------------------- Patient/Caregiver Education Details Patient Name: Tozer, Braeleigh C. Date of Service:  06/04/2022 1:15 PM Medical Record Number: [id number] Patient Account Number: [id number] Date of Birth/Gender: [birth year] (61 y.o. F) Treating RN: Yevonne Pax Primary Care Physician: [name] Other Clinician: Referring Physician: Card, John Treating Physician/Extender: Rowan Blase in Treatment: 36 Education Assessment Education Provided To: Patient Education Topics Provided Wound/Skin Impairment: Methods: Explain/Verbal Responses: State content correctly Electronic Signature(s) Signed: 06/04/2022 5:00:48 PM By: Yevonne Pax RN Entered By: Yevonne Pax on 06/04/2022 14:02:03 Pawley, Xylina C. ([id number]) -------------------------------------------------------------------------------- Wound Assessment Details Patient Name: Barua, Deniece C. Date of Service: 06/04/2022 1:15 PM Medical Record Number: [id number] Patient Account Number: [id number] Date of Birth/Sex: Dec 05, 1961 (61 y.o. F) Treating RN: Yevonne Pax Primary Care Shatira Dobosz: [name] Other Clinician: Referring Lenni Reckner: Card, John Treating Nickcole Bralley/Extender: Rowan Blase in Treatment: 33 Wound Status Wound Number: 1 Primary Etiology: Dehisced Wound Wound Location: Distal, Midline Back Wound Status: Open Wounding Event: Surgical Injury Comorbid History: Hypertension Date Acquired: 04/11/2021 Weeks Of Treatment: 33 Clustered Wound: No Photos Wound Measurements Length: (cm) 0.4 Width: (cm) 0.3 Depth: (cm) 3.5 Area: (cm) 0.094  Volume: (cm) 0.33 % Reduction in Area: 25.4% % Reduction in Volume: 42.9% Epithelialization: None Tunneling: No Undermining: No Wound Description Classification: Full Thickness Without Exposed Support Structures Exudate Amount: Medium Exudate Type: Serous Exudate Color: amber Foul Odor After Cleansing: No Slough/Fibrino No Wound Bed Granulation Amount: Large (67-100%) Exposed Structure Granulation Quality: Red Fascia Exposed: No Necrotic Amount: None Present (0%) Fat  Layer (Subcutaneous Tissue) Exposed: Yes Tendon Exposed: No Muscle Exposed: No Joint Exposed: No Bone Exposed: No Treatment Notes Wound #1 (Back) Wound Laterality: Midline, Distal Cleanser Normal Saline Discharge Instruction: Wash your hands with soap and water. Remove old dressing, discard into plastic bag and place into trash. Cleanse the wound with Normal Saline prior to applying a clean dressing using gauze sponges, not tissues or cotton balls. Do not scrub or use excessive force. Pat dry using gauze sponges, not tissue or cotton balls. ETHELYNE, ERICH ([id number]) Peri-Wound Care Topical Primary Dressing Hydrofera Blue Rope Discharge Instruction: cut into fourths; angle the end Secondary Dressing (SILICONE BORDER) Zetuvit Plus SILICONE BORDER Dressing 4x4 (in/in) Secured With Compression Wrap Compression Stockings Add-Ons Electronic Signature(s) Signed: 06/04/2022 5:00:48 PM By: Yevonne Pax RN Entered By: Yevonne Pax on 06/04/2022 14:30:58 Othman, Hailei C. ([id number]) -------------------------------------------------------------------------------- Vitals Details Patient Name: Lumbert, Cieara C. Date of Service: 06/04/2022 1:15 PM Medical Record Number: [id number] Patient Account Number: [id number] Date of Birth/Sex: 03/12/1961 (61 y.o. F) Treating RN: Yevonne Pax Primary Care Bowden Boody: [name] Other Clinician: Referring Deagan Sevin: Card, John Treating Kniyah Khun/Extender: Rowan Blase in Treatment: 33 Vital Signs Time Taken: 13:40 Temperature (F): 98.3 Weight (lbs): 275 Pulse (bpm): 72 Respiratory Rate (breaths/min): 18 Blood Pressure (mmHg): 146/89 Reference Range: 80 - 120 mg / dl Electronic Signature(s) Signed: 06/04/2022 5:00:48 PM By: Yevonne Pax RN Entered By: Yevonne Pax on 06/04/2022 13:46:51

## 2022-06-06 DIAGNOSIS — T8131XA Disruption of external operation (surgical) wound, not elsewhere classified, initial encounter: Secondary | ICD-10-CM | POA: Diagnosis not present

## 2022-06-07 ENCOUNTER — Ambulatory Visit: Payer: 59 | Admitting: Occupational Therapy

## 2022-06-07 DIAGNOSIS — I890 Lymphedema, not elsewhere classified: Secondary | ICD-10-CM | POA: Diagnosis not present

## 2022-06-07 NOTE — Progress Notes (Signed)
JESSICE, MADILL ([id number]) Visit Report for 06/06/2022 Physician Orders Details Patient Name: [name], [name] Date of Service: 06/06/2022 3:30 PM Medical Record Number: [id number] Patient Account Number: [id number] Date of Birth/Sex: 1961-05-12 (61 y.o. F) Treating RN: Yevonne Pax Primary Care Provider: [name] Other Clinician: Referring Provider: Card, John Treating Provider/Extender: Rowan Blase in Treatment: 51 Verbal / Phone Orders: No Diagnosis Coding Follow-up Appointments o Return Appointment in 1 week. o Nurse Visit as needed Home Health o Home [facility]: [facility] o [facility] for wound care. May utilize formulary equivalent dressing for wound treatment orders unless otherwise specified. Home Health Nurse may visit PRN to address patientos wound care needs. - Monday and Friday [facility] fax [phone] Bathing/ Shower/ Hygiene o May shower; gently cleanse wound with antibacterial soap, rinse and pat dry prior to dressing wounds o No tub bath. Anesthetic (Use 'Patient Medications' Section for Anesthetic Order Entry) o Lidocaine applied to wound bed Edema Control - Lymphedema / Segmental Compressive Device / Other o Elevate, Exercise Daily and Avoid Standing for Long Periods of Time. o Elevate legs to the level of the heart and pump ankles as often as possible o Elevate leg(s) parallel to the floor when sitting. Additional Orders / Instructions o Follow Nutritious Diet and Increase Protein Intake Wound Treatment Wound #1 - Back Wound Laterality: Midline, Distal Cleanser: Normal Saline 1 x Per Day/30 Days Discharge Instructions: Wash your hands with soap and water. Remove old dressing, discard into plastic bag and place into trash. Cleanse the wound with Normal Saline prior to applying a clean dressing using gauze sponges, not tissues or cotton balls. Do not scrub or use excessive force. Pat dry using gauze sponges, not tissue  or cotton balls. Primary Dressing: Hydrofera Blue Rope 1 x Per Day/30 Days Discharge Instructions: cut into fourths; angle the end Secondary Dressing: (SILICONE BORDER) Zetuvit Plus SILICONE BORDER Dressing 4x4 (in/in) 1 x Per Day/30 Days Electronic Signature(s) Signed: 06/06/2022 4:11:43 PM By: [name] PA-C Signed: 06/07/2022 9:45:44 AM By: Yevonne Pax RN Entered By: Yevonne Pax on 06/06/2022 15:45:53 [name], [name] ([id number]) -------------------------------------------------------------------------------- SuperBill Details Patient Name: [name], [name] Date of Service: 06/06/2022 Medical Record Number: [id number] Patient Account Number: [id number] Date of Birth/Sex: 08/07/1961 (61 y.o. 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

## 2022-06-07 NOTE — Progress Notes (Signed)
[name] ([id number]) Visit Report for 06/06/2022 Arrival Information Details Patient Name: [name] Date of Service: 06/06/2022 3:30 PM Medical Record Number: [id number] Patient Account Number: [id number] Date of Birth/Sex: 07/29/1961 (61 y.o. F) Treating RN: Carlene Coria Primary Care Enna Warwick: Card, Jenny Reichmann Other Clinician: Referring Mikailah Morel: Card, John Treating Jaquelin Meaney/Extender: Skipper Cliche in Treatment: 74 Visit Information History Since Last Visit All ordered tests and consults were completed: No Patient Arrived: [name] Added or deleted any medications: No Arrival Time: 15:44 Any new allergies or adverse reactions: No Accompanied By: self Had a fall or experienced change in No Transfer Assistance: None activities of daily living that may affect Patient Identification Verified: Yes risk of falls: Secondary Verification Process Completed: Yes Signs or symptoms of abuse/neglect since last visito No Patient Requires Transmission-Based Precautions: No Hospitalized since last visit: No Patient Has Alerts: No Implantable device outside of the clinic excluding No cellular tissue based products placed in the center since last visit: Has Dressing in Place as Prescribed: Yes Pain Present Now: No Electronic Signature(s) Signed: 06/07/2022 9:45:44 AM By: Carlene Coria RN Entered By: Carlene Coria on 06/06/2022 15:45:02 [name], [name] ([id number]) -------------------------------------------------------------------------------- Clinic Level of Care Assessment Details Patient Name: [name], [name] Date of Service: 06/06/2022 3:30 PM Medical Record Number: [id number] Patient Account Number: [id number] Date of Birth/Sex: 1961/04/27 (61 y.o. F) Treating RN: Carlene Coria Primary Care Mickel Schreur: Card, Jenny Reichmann Other Clinician: Referring Aava Deland: Card, John Treating Takera Rayl/Extender: Skipper Cliche in Treatment: 33 Clinic Level of Care Assessment Items TOOL 4  Quantity Score X - Use when only an EandM is performed on FOLLOW-UP visit 1 0 ASSESSMENTS - Nursing Assessment / Reassessment X - Reassessment of Co-morbidities (includes updates in patient status) 1 10 X- 1 5 Reassessment of Adherence to Treatment Plan ASSESSMENTS - Wound and Skin Assessment / Reassessment X - Simple Wound Assessment / Reassessment - one wound 1 5 []  - 0 Complex Wound Assessment / Reassessment - multiple wounds []  - 0 Dermatologic / Skin Assessment (not related to wound area) ASSESSMENTS - Focused Assessment []  - Circumferential Edema Measurements - multi extremities 0 []  - 0 Nutritional Assessment / Counseling / Intervention []  - 0 Lower Extremity Assessment (monofilament, tuning fork, pulses) []  - 0 Peripheral Arterial Disease Assessment (using hand held doppler) ASSESSMENTS - Ostomy and/or Continence Assessment and Care []  - Incontinence Assessment and Management 0 []  - 0 Ostomy Care Assessment and Management (repouching, etc.) PROCESS - Coordination of Care X - Simple Patient / Family Education for ongoing care 1 15 []  - 0 Complex (extensive) Patient / Family Education for ongoing care []  - 0 Staff obtains Programmer, systems, Records, Test Results / Process Orders []  - 0 Staff telephones HHA, Nursing Homes / Clarify orders / etc []  - 0 Routine Transfer to another Facility (non-emergent condition) []  - 0 Routine Hospital Admission (non-emergent condition) []  - 0 New Admissions / Biomedical engineer / Ordering NPWT, Apligraf, etc. []  - 0 Emergency Hospital Admission (emergent condition) X- 1 10 Simple Discharge Coordination []  - 0 Complex (extensive) Discharge Coordination PROCESS - Special Needs []  - Pediatric / Minor Patient Management 0 []  - 0 Isolation Patient Management []  - 0 Hearing / Language / Visual special needs []  - 0 Assessment of Community assistance (transportation, D/C planning, etc.) []  - 0 Additional assistance / Altered  mentation []  - 0 Support Surface(s) Assessment (bed, cushion, seat, etc.) INTERVENTIONS - Wound Cleansing / Measurement Westerhoff, Timiya C. ([id number]) X- 1 5 Simple Wound Cleansing -  one wound []  - 0 Complex Wound Cleansing - multiple wounds X- 1 5 Wound Imaging (photographs - any number of wounds) []  - 0 Wound Tracing (instead of photographs) X- 1 5 Simple Wound Measurement - one wound []  - 0 Complex Wound Measurement - multiple wounds INTERVENTIONS - Wound Dressings X - Small Wound Dressing one or multiple wounds 1 10 []  - 0 Medium Wound Dressing one or multiple wounds []  - 0 Large Wound Dressing one or multiple wounds []  - 0 Application of Medications - topical []  - 0 Application of Medications - injection INTERVENTIONS - Miscellaneous []  - External ear exam 0 []  - 0 Specimen Collection (cultures, biopsies, blood, body fluids, etc.) []  - 0 Specimen(s) / Culture(s) sent or taken to Lab for analysis []  - 0 Patient Transfer (multiple staff / Civil Service fast streamer / Similar devices) []  - 0 Simple Staple / Suture removal (25 or less) []  - 0 Complex Staple / Suture removal (26 or more) []  - 0 Hypo / Hyperglycemic Management (close monitor of Blood Glucose) []  - 0 Ankle / Brachial Index (ABI) - do not check if billed separately X- 1 5 Vital Signs Has the patient been seen at the hospital within the last three years: Yes Total Score: 75 Level Of Care: New/Established - Level 2 Electronic Signature(s) Signed: 06/07/2022 9:45:44 AM By: Carlene Coria RN Entered By: Carlene Coria on 06/06/2022 15:48:25 [name], [name] ([id number]) -------------------------------------------------------------------------------- Encounter Discharge Information Details Patient Name: [name], [name] Date of Service: 06/06/2022 3:30 PM Medical Record Number: [id number] Patient Account Number: [id number] Date of Birth/Sex: April 01, 1961 (61 y.o. F) Treating RN: Carlene Coria Primary Care Valeda Corzine: Clayborn Heron Other Clinician: Referring Abbeygail Igoe: Card, John Treating Hiral Lukasiewicz/Extender: Skipper Cliche in Treatment: 51 Encounter Discharge Information Items Discharge Condition: Stable Ambulatory Status: Walker Discharge Destination: Home Transportation: Private Auto Accompanied By: self Schedule Follow-up Appointment: Yes Clinical Summary of Care: Patient Declined Electronic Signature(s) Signed: 06/07/2022 9:45:44 AM By: Carlene Coria RN Entered By: Carlene Coria on 06/06/2022 15:46:21 Hannis, Nichola C. ([id number]) -------------------------------------------------------------------------------- Wound Assessment Details Patient Name: [name], [name] Date of Service: 06/06/2022 3:30 PM Medical Record Number: [id number] Patient Account Number: [id number] Date of Birth/Sex: February 19, 1961 (61 y.o. F) Treating RN: Carlene Coria Primary Care Benjie Ricketson: Card, Jenny Reichmann Other Clinician: Referring Hayes Czaja: Card, John Treating Mckensie Scotti/Extender: Skipper Cliche in Treatment: 33 Wound Status Wound Number: 1 Primary Etiology: Dehisced Wound Wound Location: Distal, Midline Back Wound Status: Open Wounding Event: Surgical Injury Comorbid History: Hypertension Date Acquired: 04/11/2021 Weeks Of Treatment: 33 Clustered Wound: No Wound Measurements Length: (cm) 0.4 Width: (cm) 0.3 Depth: (cm) 3.5 Area: (cm) 0.094 Volume: (cm) 0.33 % Reduction in Area: 25.4% % Reduction in Volume: 42.9% Epithelialization: None Tunneling: No Undermining: No Wound Description Classification: Full Thickness Without Exposed Support Structu Exudate Amount: Medium Exudate Type: Serous Exudate Color: amber res Foul Odor After Cleansing: No Slough/Fibrino No Wound Bed Granulation Amount: Large (67-100%) Exposed Structure Granulation Quality: Red Fascia Exposed: No Necrotic Amount: None Present (0%) Fat Layer (Subcutaneous Tissue) Exposed: Yes Tendon Exposed: No Muscle Exposed: No Joint Exposed: No Bone  Exposed: No Treatment Notes Wound #1 (Back) Wound Laterality: Midline, Distal Cleanser Normal Saline Discharge Instruction: Wash your hands with soap and water. Remove old dressing, discard into plastic bag and place into trash. Cleanse the wound with Normal Saline prior to applying a clean dressing using gauze sponges, not tissues or cotton balls. Do not scrub or use excessive force. Pat dry using gauze sponges, not tissue or  cotton balls. Peri-Wound Care Topical Primary Dressing Hydrofera Blue Rope Discharge Instruction: cut into fourths; angle the end Secondary Dressing (SILICONE BORDER) Zetuvit Plus SILICONE BORDER Dressing 4x4 (in/in) Secured With Compression Wrap Compression Stockings [name], [name] ([id number]) Add-Ons Electronic Signature(s) Signed: 06/07/2022 9:45:44 AM By: Carlene Coria RN Entered By: Carlene Coria on 06/06/2022 15:45:22

## 2022-06-07 NOTE — Patient Instructions (Signed)

## 2022-06-07 NOTE — Therapy (Signed)
[place] [facility] SERVICES [address] [place], [name], [address] Phone: [phone]   Fax:  [phone]  Occupational Therapy Treatment  Patient Details  Name: [name] MRN: [id number] Date of Birth: Apr 24, 1961 Referring Provider (OT): Donella Stade Arringtonm FNP   Encounter Date: 06/07/2022   OT End of Session - 06/07/22 1110     Visit Number 23    Number of Visits 36    Date for OT Re-Evaluation 08/12/22    OT Start Time 1100    OT Stop Time 1200    OT Time Calculation (min) 60 min    Activity Tolerance Patient tolerated treatment well;No increased pain    Behavior During Therapy WFL for tasks assessed/performed             Past Medical History:  Diagnosis Date   Arthritis    Back pain    Chronic knee pain    Edema of both lower extremities    High blood pressure    High cholesterol    Joint pain    Multiple sclerosis ([place])    Neuromuscular disorder (HCC)    Multiple Sclerosis over 20 years   Obesity    Vitamin D deficiency     Past Surgical History:  Procedure Laterality Date   APPENDECTOMY     CESAREAN SECTION     HAND SURGERY     lumbar back surgery     LUMBAR WOUND DEBRIDEMENT N/A 12/05/2020   Procedure: Lumbar Wound Exploration;  Surgeon: Ashok Pall, MD;  Location: [place];  Service: Neurosurgery;  Laterality: N/A;  3C/RM 21   LUMBAR WOUND DEBRIDEMENT N/A 02/21/2021   Procedure: Lumbar wound exploration;  Surgeon: Ashok Pall, MD;  Location: [place];  Service: Neurosurgery;  Laterality: N/A;  posterior   LUMBAR WOUND DEBRIDEMENT N/A 04/11/2021   Procedure: Wound Exploration with Wound Vac Placement;  Surgeon: Ashok Pall, MD;  Location: [place];  Service: Neurosurgery;  Laterality: N/A;   ROTATOR CUFF REPAIR     TONSILLECTOMY      There were no vitals filed for this visit.   Subjective Assessment - 06/07/22 1113     Subjective  [name] presents for OT Rx to address treatment of BLE  lymphedema. Pt denies LE related pain in her legs.Pt presents   in manual transport w/c w custom compression garment in place on the RLE, and multilayer compression wraps on the LLE. Pt has no new concerns by report. Pt denies LE related pain in her legs this morning.    Pertinent History B knee OA, HTN, Obesity, MS, Spondylolisthesis-lumbar, open wound at lumbar surgical site since 4/22.    Limitations difficulty walking, impaired transfers, chronic OA pain in bilateral knees, non healing post surgical lumbar wound, BLE muscle weakness 2/2 MS, chronic leg swelling with minimal pain,    Special Tests - Stemmer bilaterally; Intake FOTO score =31/100    Patient Stated Goals get my legs better so I can do more    Currently in Pain? No/denies    Pain Onset Other (comment)   6 mnths ago without precipitating event. No family hx   Pain Onset Other (comment)   years                         OT Treatments/Exercises (OP) - 06/07/22 1115       ADLs   ADL Education Given Yes      Manual Therapy  Manual Therapy Edema management    Manual Lymphatic Drainage (MLD) MLD to LLE/ LLQ utilizing diaphragmatic breathing to access deep abdominal pathways, short neck sequence and inguinal LN, then proovided MLD from proximal to distal addressing thigh, leg and foot. Foinished up session with short neck x 3 and deep breathing.    Compression Bandaging LLE multilayer compression  wraps frome base of toes to popliteal using gradient techniques                    OT Education - 06/07/22 1201     Education Details Continued Pt/ CG edu for lymphedema self care home program throughout session. Topics include multilayer compression wrapping, simple self-MLD, therapeutic lymphatic pumping exercises, skin/nail care, infection and progression risk reduction factors (LE precautions) , compression garment recommendations and specifications, wear and care schedule and compression garment donning /  doffing w assistive devices. Discussed progress towards all OT goals since commencing CDT. All questions answered to the Pt's satisfaction. Good return.    Person(s) Educated Patient    Methods Explanation;Demonstration;Handout    Comprehension Verbalized understanding;Returned demonstration;Need further instruction                 OT Long Term Goals - 05/20/22 1312       OT LONG TERM GOAL #1   Title Given this patient's Intake score 31 /100 on the functional outcomes FOTO tool, patient will experience an increase in function of 5 points to improve basic and instrumental ADLs performance, including lymphedema self-care.    Baseline 31/100    Period Weeks    Status On-going   TBA next visit   Target Date 08/18/22      OT LONG TERM GOAL #2   Title Pt will demonstrate understanding of lymphedema prevention strategies by identifying and discussing 5 precautions using printed reference (modified assistance) to reduce risk of progression and to limit infection risk.    Baseline Max A    Time 4    Period Days    Status Achieved    Target Date --      OT LONG TERM GOAL #3   Title With Maximum caregiver assistance Pt will be able to apply multilayer, LUE compression wraps using gradient techniques to decrease limb volume, to limit infection risk, and to limit lymphedema progression. Pt unable to wrap legs independently. Trained caregiver must assist during visit intervals for CDT to be effective.    Baseline dependent - Max caregiver assistance is necessary to complete Intensive Phase of Complete Decongestive Therapy and Lymphedema self-care home program  as Pt is unable to reach his feet to apply compression wraps and garments, to inspect skin, to groom nails to perform skin care and inspection and to perform simple self-MLD. Pt agrees to arrange for consistent caregiver prior to commencing CDT.    Time 4    Period Days    Status Achieved   Met and exceeded. Pt is able to apply BLE  multilayer compression wraps using correct gradient techniques with modified independence ( extra time).   Target Date --   4th OT Rx visit     OT LONG TERM GOAL #4   Title With Maximum caregiver assistance between OT sessions Pt will achieve at least a 10% limb volume reduction below the knee bilaterally to return limb to more normal size and shape, to limit infection risk, to decrease pain, to improve function, and to limit lymphedema progression.   Pt has achieved 8% voume reduction  to date , which is excellent progress towards goals.   Baseline dependent    Time 12    Period Weeks    Status Partially Met   RLE A-D volume is reduced by 8.5% since commencing CDT on 3/l/23. LLE is reduced in volume by 5.4% to date. Pt has achieved these reductions without CG assistance.   Target Date 08/18/22      OT LONG TERM GOAL #5   Title With caregiver assistance  Pt will achieve and sustain a least 85% compliance with all 4 LE self-care home program components throughout Intensive Phase CDT, including modified simple self-MLD, daily skin inspection and care, lymphatic pumping the ex, 23/7 compression wraps to optimize limb volume reductions, to limit lymphedema progression and to limit further functional decline.    Baseline Dependent    Time 12    Period Weeks    Status Achieved   Goal met and exceeded. Pt remains > 85% compliant with LE home program   with modified independence (extra time).   Target Date 05/16/22                   Plan - 06/07/22 1201     Clinical Impression Statement L medial and lateral ankle mildly more swollen today than when last treated. Reviewed bandaging technique  to ensure Pt able to capture these areas of fatty fibrosis when applying wraps herself. Provided MLD and simultaneous skin care to LLE/ LLQ as established. Applied L compression wraps as established Pt remains compliant with aspects of home program she has learned thus far. Pt agrees with plan to complete  garment measurements for LLE next visit. Cont as per POC.    OT Occupational Profile and History Detailed Assessment- Review of Records and additional review of physical, cognitive, psychosocial history related to current functional performance    Occupational performance deficits (Please refer to evaluation for details): ADL's;IADL's;Rest and Sleep;Leisure;Social Participation;Work    Writer Several treatment options, min-mod task modification necessary    Comorbidities Affecting Occupational Performance: May have comorbidities impacting occupational performance    Modification or Assistance to Complete Evaluation  Min-Moderate modification of tasks or assist with assess necessary to complete eval    OT Frequency 2x / week    OT Duration 12 weeks    OT Treatment/Interventions Self-care/ADL training;DME and/or AE instruction;Manual lymph drainage;Compression bandaging;Therapeutic activities;Coping strategies training;Therapeutic exercise;Other (comment);Manual Therapy;Energy conservation;Patient/family education   skin care, Flexitouch trial, fit with appropriate compression garments that are comfortable, effective and that Pt is able to don and doff using assistive devices PRN   Recommended Other Services " Calcium channel blockers may not directly impair lymphatic function. However our results show that a reduced lymphatic function predisposes to CBC edema, which may explain why some patients develop edema during treatment." [employer], Niklas Telinius, Claiborne Billings, and Vibeke Hjortdal.  Reduced Lymphatic Function Predisposes to Calcium Channel Blocker Edema: A Randomized Placebo-Controlled Clinical Trial.  Lymphatic Research and Biology.Apr [email or web address]://[email or web address]/[email or web address]  Published in Volume: 18 Issue 2: April 16, 2019  Online Ahead of Print:August 18, 2018    Consulted and Agree with Plan of Care Patient              Patient will benefit from skilled therapeutic intervention in order to improve the following deficits and impairments:           Visit Diagnosis: Lymphedema, not elsewhere classified    Problem List Patient Active Problem List  Diagnosis Date Noted   Postoperative seroma of subcutaneous tissue after non-dermatologic procedure 04/11/2021   Wound drainage 02/21/2021   Wound dehiscence 02/21/2021   Postoperative seroma of musculoskeletal structure after musculoskeletal procedure 12/05/2020   Spondylolisthesis of lumbar region 11/02/2020   Insulin resistance 02/15/2020   Class 3 severe obesity with serious comorbidity and body mass index (BMI) of 45.0 to 49.9 in adult [facility]) 01/19/2020   Vitamin D deficiency 01/18/2020   Other hyperlipidemia 02/22/2019   Primary osteoarthritis of both knees 10/14/2018   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

## 2022-06-11 ENCOUNTER — Ambulatory Visit: Payer: 59 | Admitting: Occupational Therapy

## 2022-06-11 DIAGNOSIS — T8131XA Disruption of external operation (surgical) wound, not elsewhere classified, initial encounter: Secondary | ICD-10-CM | POA: Diagnosis not present

## 2022-06-11 DIAGNOSIS — I890 Lymphedema, not elsewhere classified: Secondary | ICD-10-CM | POA: Diagnosis not present

## 2022-06-11 NOTE — Therapy (Unsigned)
[place] MAIN [facility] SERVICES [address] [place], [name], [address] Phone: [phone]   Fax:  [phone]  Occupational Therapy Treatment  Patient Details  Name: [name] MRN: [id number] Date of Birth: Aug 08, 1961 Referring Provider (OT): Donella Stade Arringtonm FNP   Encounter Date: 06/11/2022   OT End of Session - 06/11/22 1020     Visit Number 24    Number of Visits 36    Date for OT Re-Evaluation 08/12/22    OT Start Time 1007    OT Stop Time 1107    OT Time Calculation (min) 60 min    Activity Tolerance Patient tolerated treatment well;No increased pain    Behavior During Therapy WFL for tasks assessed/performed             Past Medical History:  Diagnosis Date   Arthritis    Back pain    Chronic knee pain    Edema of both lower extremities    High blood pressure    High cholesterol    Joint pain    Multiple sclerosis ([place])    Neuromuscular disorder (HCC)    Multiple Sclerosis over 20 years   Obesity    Vitamin D deficiency     Past Surgical History:  Procedure Laterality Date   APPENDECTOMY     CESAREAN SECTION     HAND SURGERY     lumbar back surgery     LUMBAR WOUND DEBRIDEMENT N/A 12/05/2020   Procedure: Lumbar Wound Exploration;  Surgeon: Ashok Pall, MD;  Location: [place];  Service: Neurosurgery;  Laterality: N/A;  3C/RM 21   LUMBAR WOUND DEBRIDEMENT N/A 02/21/2021   Procedure: Lumbar wound exploration;  Surgeon: Ashok Pall, MD;  Location: [place];  Service: Neurosurgery;  Laterality: N/A;  posterior   LUMBAR WOUND DEBRIDEMENT N/A 04/11/2021   Procedure: Wound Exploration with Wound Vac Placement;  Surgeon: Ashok Pall, MD;  Location: [place];  Service: Neurosurgery;  Laterality: N/A;   ROTATOR CUFF REPAIR     TONSILLECTOMY      There were no vitals filed for this visit.   Subjective Assessment - 06/11/22 1608     Subjective  [name] presents for OT Rx to address treatment of BLE  lymphedemaPt presents   in manual transport w/c w custom compression garment in place on the RLE, and multilayer compression wraps on the LLE. Pt has no new concerns by report. Pt denies LE related pain in her legs this morning.    Pertinent History B knee OA, HTN, Obesity, MS, Spondylolisthesis-lumbar, open wound at lumbar surgical site since 4/22.    Limitations difficulty walking, impaired transfers, chronic OA pain in bilateral knees, non healing post surgical lumbar wound, BLE muscle weakness 2/2 MS, chronic leg swelling with minimal pain,    Special Tests - Stemmer bilaterally; Intake FOTO score =31/100    Patient Stated Goals get my legs better so I can do more    Currently in Pain? No/denies    Pain Onset Other (comment)   6 mnths ago without precipitating event. No family hx   Pain Onset Other (comment)   years                         OT Treatments/Exercises (OP) - 06/11/22 1609       ADLs   ADL Education Given Yes      Manual Therapy   Manual Therapy Edema management  Edema Management initial RLE anatomical measurements for custom compression garment.    Compression Bandaging LLE multilayer compression  wraps frome base of toes to popliteal using gradient techniques                    OT Education - 06/11/22 1610     Education Details Continued Pt/ CG edu for lymphedema self care home program throughout session. Topics include multilayer compression wrapping, simple self-MLD, therapeutic lymphatic pumping exercises, skin/nail care, infection and progression risk reduction factors (LE precautions) , compression garment recommendations and specifications, wear and care schedule and compression garment donning / doffing w assistive devices. Discussed progress towards all OT goals since commencing CDT. All questions answered to the Pt's satisfaction. Good return.    Person(s) Educated Patient    Methods Explanation;Demonstration;Handout    Comprehension  Verbalized understanding;Returned demonstration;Need further instruction                 OT Long Term Goals - 05/20/22 1312       OT LONG TERM GOAL #1   Title Given this patient's Intake score 31 /100 on the functional outcomes FOTO tool, patient will experience an increase in function of 5 points to improve basic and instrumental ADLs performance, including lymphedema self-care.    Baseline 31/100    Period Weeks    Status On-going   TBA next visit   Target Date 08/18/22      OT LONG TERM GOAL #2   Title Pt will demonstrate understanding of lymphedema prevention strategies by identifying and discussing 5 precautions using printed reference (modified assistance) to reduce risk of progression and to limit infection risk.    Baseline Max A    Time 4    Period Days    Status Achieved    Target Date --      OT LONG TERM GOAL #3   Title With Maximum caregiver assistance Pt will be able to apply multilayer, LUE compression wraps using gradient techniques to decrease limb volume, to limit infection risk, and to limit lymphedema progression. Pt unable to wrap legs independently. Trained caregiver must assist during visit intervals for CDT to be effective.    Baseline dependent - Max caregiver assistance is necessary to complete Intensive Phase of Complete Decongestive Therapy and Lymphedema self-care home program  as Pt is unable to reach his feet to apply compression wraps and garments, to inspect skin, to groom nails to perform skin care and inspection and to perform simple self-MLD. Pt agrees to arrange for consistent caregiver prior to commencing CDT.    Time 4    Period Days    Status Achieved   Met and exceeded. Pt is able to apply BLE multilayer compression wraps using correct gradient techniques with modified independence ( extra time).   Target Date --   4th OT Rx visit     OT LONG TERM GOAL #4   Title With Maximum caregiver assistance between OT sessions Pt will achieve at  least a 10% limb volume reduction below the knee bilaterally to return limb to more normal size and shape, to limit infection risk, to decrease pain, to improve function, and to limit lymphedema progression.   Pt has achieved 8% voume reduction to date , which is excellent progress towards goals.   Baseline dependent    Time 12    Period Weeks    Status Partially Met   RLE A-D volume is reduced by 8.5% since commencing CDT on  3/l/23. LLE is reduced in volume by 5.4% to date. Pt has achieved these reductions without CG assistance.   Target Date 08/18/22      OT LONG TERM GOAL #5   Title With caregiver assistance  Pt will achieve and sustain a least 85% compliance with all 4 LE self-care home program components throughout Intensive Phase CDT, including modified simple self-MLD, daily skin inspection and care, lymphatic pumping the ex, 23/7 compression wraps to optimize limb volume reductions, to limit lymphedema progression and to limit further functional decline.    Baseline Dependent    Time 12    Period Weeks    Status Achieved   Goal met and exceeded. Pt remains > 85% compliant with LE home program   with modified independence (extra time).   Target Date 05/16/22                   Plan - 06/11/22 1606     Clinical Impression Statement Completed initial LLE anatomical measurements for custom compression knee highs. Faxed to DME vendor after session. Will fit ASAP. Reapplied LLE multilayered compression wraps as established. Cont as per POC.    OT Occupational Profile and History Detailed Assessment- Review of Records and additional review of physical, cognitive, psychosocial history related to current functional performance    Occupational performance deficits (Please refer to evaluation for details): ADL's;IADL's;Rest and Sleep;Leisure;Social Participation;Work    Writer Several treatment options, min-mod task modification necessary     Comorbidities Affecting Occupational Performance: May have comorbidities impacting occupational performance    Modification or Assistance to Complete Evaluation  Min-Moderate modification of tasks or assist with assess necessary to complete eval    OT Frequency 2x / week    OT Duration 12 weeks    OT Treatment/Interventions Self-care/ADL training;DME and/or AE instruction;Manual lymph drainage;Compression bandaging;Therapeutic activities;Coping strategies training;Therapeutic exercise;Other (comment);Manual Therapy;Energy conservation;Patient/family education   skin care, Flexitouch trial, fit with appropriate compression garments that are comfortable, effective and that Pt is able to don and doff using assistive devices PRN   Recommended Other Services " Calcium channel blockers may not directly impair lymphatic function. However our results show that a reduced lymphatic function predisposes to CBC edema, which may explain why some patients develop edema during treatment." [employer], Niklas Telinius, Claiborne Billings, and Vibeke Hjortdal.  Reduced Lymphatic Function Predisposes to Calcium Channel Blocker Edema: A Randomized Placebo-Controlled Clinical Trial.  Lymphatic Research and Biology.Apr [email or web address]://[email or web address]/lrb.[email or web address]  Published in Volume: 18 Issue 2: April 16, 2019  Online Ahead of Print:August 18, 2018    Consulted and Agree with Plan of Care Patient             Patient will benefit from skilled therapeutic intervention in order to improve the following deficits and impairments:           Visit Diagnosis: Lymphedema, not elsewhere classified    Problem List Patient Active Problem List   Diagnosis Date Noted   Postoperative seroma of subcutaneous tissue after non-dermatologic procedure 04/11/2021   Wound drainage 02/21/2021   Wound dehiscence 02/21/2021   Postoperative seroma of musculoskeletal structure after musculoskeletal procedure 12/05/2020    Spondylolisthesis of lumbar region 11/02/2020   Insulin resistance 02/15/2020   Class 3 severe obesity with serious comorbidity and body mass index (BMI) of 45.0 to 49.9 in adult ([place]) 01/19/2020   Vitamin D deficiency 01/18/2020   Other hyperlipidemia 02/22/2019   Primary osteoarthritis of both knees 10/14/2018  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   Essential hypertension 02/16/2016   Multiple sclerosis ([place]) 04/06/2013    Ansel Bong, OT 06/11/2022, 4:11 PM  [place] MAIN [facility] SERVICES [address] [place], [name], [address] Phone: [phone]   Fax:  [phone]  Name: [name] MRN: [id number] Date of Birth: [birth year]

## 2022-06-11 NOTE — Progress Notes (Signed)
RECIE, CIRRINCIONE ([id number]) Visit Report for 06/11/2022 Arrival Information Details Patient Name: [name], [name] Date of Service: 06/11/2022 1:15 PM Medical Record Number: [id number] Patient Account Number: [id number] Date of Birth/Sex: 06-13-1961 (61 y.o. F) Treating RN: Yevonne Pax Primary Care Zyshawn Bohnenkamp: Card, Jonny Ruiz Other Clinician: Referring Kelee Cunningham: Card, John Treating Zakyla Tonche/Extender: Altamese [place] in Treatment: 67 Visit Information History Since Last Visit All ordered tests and consults were completed: No Patient Arrived: [name] Added or deleted any medications: No Arrival Time: 13:08 Any new allergies or adverse reactions: No Accompanied By: self Had a fall or experienced change in No Transfer Assistance: None activities of daily living that may affect Patient Identification Verified: Yes risk of falls: Secondary Verification Process Completed: Yes Signs or symptoms of abuse/neglect since last visito No Patient Requires Transmission-Based Precautions: No Hospitalized since last visit: No Patient Has Alerts: No Implantable device outside of the clinic excluding No cellular tissue based products placed in the center since last visit: Has Dressing in Place as Prescribed: Yes Pain Present Now: No Electronic Signature(s) Signed: 06/11/2022 1:39:03 PM By: Yevonne Pax RN Entered By: Yevonne Pax on 06/11/2022 13:39:03 [name], [name] ([id number]) -------------------------------------------------------------------------------- Clinic Level of Care Assessment Details Patient Name: [name], [name] Date of Service: 06/11/2022 1:15 PM Medical Record Number: [id number] Patient Account Number: [id number] Date of Birth/Sex: [birth year] (61 y.o. F) Treating RN: Yevonne Pax Primary Care Jamis Kryder: Card, Jonny Ruiz Other Clinician: Referring Devaris Quirk: Card, John Treating Anmarie Fukushima/Extender: Altamese [place] in Treatment: 34 Clinic Level of Care Assessment  Items TOOL 4 Quantity Score X - Use when only an EandM is performed on FOLLOW-UP visit 1 0 ASSESSMENTS - Nursing Assessment / Reassessment X - Reassessment of Co-morbidities (includes updates in patient status) 1 10 X- 1 5 Reassessment of Adherence to Treatment Plan ASSESSMENTS - Wound and Skin Assessment / Reassessment X - Simple Wound Assessment / Reassessment - one wound 1 5  - 0 Complex Wound Assessment / Reassessment - multiple wounds  - 0 Dermatologic / Skin Assessment (not related to wound area) ASSESSMENTS - Focused Assessment  - Circumferential Edema Measurements - multi extremities 0  - 0 Nutritional Assessment / Counseling / Intervention  - 0 Lower Extremity Assessment (monofilament, tuning fork, pulses)  - 0 Peripheral Arterial Disease Assessment (using hand held doppler) ASSESSMENTS - Ostomy and/or Continence Assessment and Care  - Incontinence Assessment and Management 0  - 0 Ostomy Care Assessment and Management (repouching, etc.) PROCESS - Coordination of Care X - Simple Patient / Family Education for ongoing care 1 15  - 0 Complex (extensive) Patient / Family Education for ongoing care  - 0 Staff obtains Chiropractor, Records, Test Results / Process Orders  - 0 Staff telephones HHA, Nursing Homes / Clarify orders / etc  - 0 Routine Transfer to another Facility (non-emergent condition)  - 0 Routine Hospital Admission (non-emergent condition)  - 0 New Admissions / Manufacturing engineer / Ordering NPWT, Apligraf, etc.  - 0 Emergency Hospital Admission (emergent condition) X- 1 10 Simple Discharge Coordination  - 0 Complex (extensive) Discharge Coordination PROCESS - Special Needs  - Pediatric / Minor Patient Management 0  - 0 Isolation Patient Management  - 0 Hearing / Language / Visual special needs  - 0 Assessment of Community assistance (transportation, D/[name] planning, etc.)  - 0 Additional assistance /  Altered mentation  - 0 Support Surface(s) Assessment (bed, cushion, seat, etc.) INTERVENTIONS - Wound Cleansing / Measurement [name], [name] ([id number]) X- 1 5 Simple Wound  Cleansing - one wound []  - 0 Complex Wound Cleansing - multiple wounds []  - 0 Wound Imaging (photographs - any number of wounds) []  - 0 Wound Tracing (instead of photographs) X- 1 5 Simple Wound Measurement - one wound []  - 0 Complex Wound Measurement - multiple wounds INTERVENTIONS - Wound Dressings []  - Small Wound Dressing one or multiple wounds 0 X- 1 15 Medium Wound Dressing one or multiple wounds []  - 0 Large Wound Dressing one or multiple wounds []  - 0 Application of Medications - topical []  - 0 Application of Medications - injection INTERVENTIONS - Miscellaneous []  - External ear exam 0 []  - 0 Specimen Collection (cultures, biopsies, blood, body fluids, etc.) []  - 0 Specimen(s) / Culture(s) sent or taken to Lab for analysis []  - 0 Patient Transfer (multiple staff / / Similar devices) []  - 0 Simple Staple / Suture removal (25 or less) []  - 0 Complex Staple / Suture removal (26 or more) []  - 0 Hypo / Hyperglycemic Management (close monitor of Blood Glucose) []  - 0 Ankle / Brachial Index (ABI) - do not check if billed separately X- 1 5 Vital Signs Has the patient been seen at the hospital within the last three years: Yes Total Score: 75 Level Of Care: New/Established - Level 2 Electronic Signature(s) Signed: 06/11/2022 2:49:16 PM By: RN Entered By: on 06/11/2022 13:40:44 [name], [name] ( ) -------------------------------------------------------------------------------- Encounter Discharge Information Details Patient Name: [name], [name] Date of Service: 06/11/2022 1:15 PM Medical Record Number: Patient Account Number: Date of Birth/Sex: 10/31/1961 (61 y.o. F) Treating RN: Nurse, adult Primary Care  Martisha Toulouse: Other Clinician: Referring Rune Mendez: Card, John Treating Farmer Mccahill/Extender: in Treatment: 70 Encounter Discharge Information Items Discharge Condition: Stable Ambulatory Status: Walker Discharge Destination: Home Transportation: Private Auto Accompanied By: self Schedule Follow-up Appointment: Yes Clinical Summary of Care: Patient Declined Electronic Signature(s) Signed: 06/11/2022 1:40:12 PM By: 06/13/2022 RN Entered By: Yevonne Pax on 06/11/2022 13:40:12 [name], [name] (06/13/2022) -------------------------------------------------------------------------------- Wound Assessment Details Patient Name: [name], [name] Date of Service: 06/11/2022 1:15 PM Medical Record Number: [id number] Patient Account Number: 06/13/2022 Date of Birth/Sex: [birth year] (61 y.o. F) Treating RN: 09/11/1961 Primary Care Jeannie Mallinger: Card, 68 Other Clinician: Referring Nasif Bos: Card, John Treating Jerol Rufener/Extender: Yevonne Pax in Treatment: 34 Wound Status Wound Number: 1 Primary Etiology: Dehisced Wound Wound Location: Distal, Midline Back Wound Status: Open Wounding Event: Surgical Injury Comorbid History: Hypertension Date Acquired: 04/11/2021 Weeks Of Treatment: 34 Clustered Wound: No Wound Measurements Length: (cm) 0.4 Width: (cm) 0.3 Depth: (cm) 3.5 Area: (cm) 0.094 Volume: (cm) 0.33 % Reduction in Area: 25.4% % Reduction in Volume: 42.9% Epithelialization: None Tunneling: No Undermining: No Wound Description Classification: Full Thickness Without Exposed Support Structu Exudate Amount: Medium Exudate Type: Serous Exudate Color: amber res Foul Odor After Cleansing: No Slough/Fibrino No Wound Bed Granulation Amount: Large (67-100%) Exposed Structure Granulation Quality: Red Fascia Exposed: No Necrotic Amount: None Present (0%) Fat Layer (Subcutaneous Tissue) Exposed: Yes Tendon Exposed: No Muscle Exposed:  No Joint Exposed: No Bone Exposed: No Treatment Notes Wound #1 (Back) Wound Laterality: Midline, Distal Cleanser Normal Saline Discharge Instruction: Wash your hands with soap and water. Remove old dressing, discard into plastic bag and place into trash. Cleanse the wound with Normal Saline prior to applying a clean dressing using gauze sponges, not tissues or cotton balls. Do not scrub or use excessive force. Pat dry using gauze sponges,  not tissue or cotton balls. Peri-Wound Care Topical Primary Dressing Hydrofera Blue Rope Discharge Instruction: cut into fourths; angle the end Secondary Dressing (SILICONE BORDER) Zetuvit Plus SILICONE BORDER Dressing 4x4 (in/in) Secured With Compression Wrap Compression Stockings [name], [name] ([id number]) Add-Ons Electronic Signature(s) Signed: 06/11/2022 1:39:26 PM By: Yevonne Pax RN Entered By: Yevonne Pax on 06/11/2022 13:39:25

## 2022-06-11 NOTE — Patient Instructions (Signed)

## 2022-06-12 NOTE — Progress Notes (Signed)
[name], [name] ([id number]) Visit Report for 06/11/2022 Physician Orders Details Patient Name: [name], [name] Date of Service: 06/11/2022 1:15 PM Medical Record Number: [id number] Patient Account Number: [id number] Date of Birth/Sex: March 15, 1961 (61 y.o. F) Treating RN: Carlene Coria Primary Care Provider: Clayborn Heron Other Clinician: Referring Provider: Card, John Treating Provider/Extender: Tito Dine in Treatment: 69 Verbal / Phone Orders: No Diagnosis Coding Follow-up Appointments o Return Appointment in 1 week. o Nurse Visit as needed [place] for wound care. May utilize formulary equivalent dressing for wound treatment orders unless otherwise specified. Home Health Nurse may visit PRN to address patientos wound care needs. - Monday and Friday [facility] fax [phone] Bathing/ Shower/ Hygiene o May shower; gently cleanse wound with antibacterial soap, rinse and pat dry prior to dressing wounds o No tub bath. Anesthetic (Use 'Patient Medications' Section for Anesthetic Order Entry) o Lidocaine applied to wound bed Edema Control - Lymphedema / Segmental Compressive Device / Other o Elevate, Exercise Daily and Avoid Standing for Long Periods of Time. o Elevate legs to the level of the heart and pump ankles as often as possible o Elevate leg(s) parallel to the floor when sitting. Additional Orders / Instructions o Follow Nutritious Diet and Increase Protein Intake Wound Treatment Wound #1 - Back Wound Laterality: Midline, Distal Cleanser: Normal Saline 1 x Per Day/30 Days Discharge Instructions: Wash your hands with soap and water. Remove old dressing, discard into plastic bag and place into trash. Cleanse the wound with Normal Saline prior to applying a clean dressing using gauze sponges, not tissues or cotton balls. Do not scrub or use excessive force. Pat dry using gauze sponges,  not tissue or cotton balls. Primary Dressing: Hydrofera Blue Rope 1 x Per Day/30 Days Discharge Instructions: cut into fourths; angle the end Secondary Dressing: (SILICONE BORDER) Zetuvit Plus SILICONE BORDER Dressing 4x4 (in/in) 1 x Per Day/30 Days Electronic Signature(s) Signed: 06/11/2022 1:39:47 PM By: Carlene Coria RN Signed: 06/12/2022 11:05:19 AM By: Linton Ham MD Entered By: Carlene Coria on 06/11/2022 13:39:46 [name], [name] ([id number]) -------------------------------------------------------------------------------- SuperBill Details Patient Name: [name], [name] Date of Service: 06/11/2022 Medical Record Number: [id number] Patient Account Number: [id number] Date of Birth/Sex: 1961/11/22 (62 y.o. 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

## 2022-06-13 ENCOUNTER — Encounter: Payer: 59 | Admitting: Physician Assistant

## 2022-06-13 DIAGNOSIS — T8131XA Disruption of external operation (surgical) wound, not elsewhere classified, initial encounter: Secondary | ICD-10-CM | POA: Diagnosis not present

## 2022-06-13 NOTE — Progress Notes (Signed)
CHASEY, TREECE ([id number]) Visit Report for 06/13/2022 Arrival Information Details Patient Name: [name], [name] Date of Service: 06/13/2022 3:30 PM Medical Record Number: [id number] Patient Account Number: [id number] Date of Birth/Sex: July 08, 1961 (61 y.o. F) Treating RN: Carlene Coria Primary Care Rease Swinson: Card, Jenny Reichmann Other Clinician: Referring Sladen Plancarte: Card, John Treating Karver Fadden/Extender: Skipper Cliche in Treatment: 107 Visit Information History Since Last Visit All ordered tests and consults were completed: No Patient Arrived: [name] Added or deleted any medications: No Arrival Time: 15:46 Any new allergies or adverse reactions: No Accompanied By: self Had a fall or experienced change in No Transfer Assistance: None activities of daily living that may affect Patient Identification Verified: Yes risk of falls: Secondary Verification Process Completed: Yes Signs or symptoms of abuse/neglect since last visito No Patient Requires Transmission-Based Precautions: No Hospitalized since last visit: No Patient Has Alerts: No Implantable device outside of the clinic excluding No cellular tissue based products placed in the center since last visit: Has Dressing in Place as Prescribed: Yes Pain Present Now: No Electronic Signature(s) Unsigned Entered ByCarlene Coria on 06/13/2022 15:46:38 Signature(s): Date(s): Cina, Dollye CMarland Kitchen ([id number]) -------------------------------------------------------------------------------- Clinic Level of Care Assessment Details Patient Name: [name], [name] Date of Service: 06/13/2022 3:30 PM Medical Record Number: [id number] Patient Account Number: [id number] Date of Birth/Sex: 10/17/1961 (61 y.o. F) Treating RN: Carlene Coria Primary Care Elkin Belfield: Card, Jenny Reichmann Other Clinician: Referring Kimorah Ridolfi: Card, John Treating Aubrianna Orchard/Extender: Skipper Cliche in Treatment: 34 Clinic Level of Care Assessment Items TOOL 4 Quantity  Score X - Use when only an EandM is performed on FOLLOW-UP visit 1 0 ASSESSMENTS - Nursing Assessment / Reassessment X - Reassessment of Co-morbidities (includes updates in patient status) 1 10 X- 1 5 Reassessment of Adherence to Treatment Plan ASSESSMENTS - Wound and Skin Assessment / Reassessment X - Simple Wound Assessment / Reassessment - one wound 1 5 []  - 0 Complex Wound Assessment / Reassessment - multiple wounds []  - 0 Dermatologic / Skin Assessment (not related to wound area) ASSESSMENTS - Focused Assessment []  - Circumferential Edema Measurements - multi extremities 0 []  - 0 Nutritional Assessment / Counseling / Intervention []  - 0 Lower Extremity Assessment (monofilament, tuning fork, pulses) []  - 0 Peripheral Arterial Disease Assessment (using hand held doppler) ASSESSMENTS - Ostomy and/or Continence Assessment and Care []  - Incontinence Assessment and Management 0 []  - 0 Ostomy Care Assessment and Management (repouching, etc.) PROCESS - Coordination of Care X - Simple Patient / Family Education for ongoing care 1 15 []  - 0 Complex (extensive) Patient / Family Education for ongoing care []  - 0 Staff obtains Programmer, systems, Records, Test Results / Process Orders []  - 0 Staff telephones HHA, Nursing Homes / Clarify orders / etc []  - 0 Routine Transfer to another Facility (non-emergent condition) []  - 0 Routine Hospital Admission (non-emergent condition) []  - 0 New Admissions / Biomedical engineer / Ordering NPWT, Apligraf, etc. []  - 0 Emergency Hospital Admission (emergent condition) X- 1 10 Simple Discharge Coordination []  - 0 Complex (extensive) Discharge Coordination PROCESS - Special Needs []  - Pediatric / Minor Patient Management 0 []  - 0 Isolation Patient Management []  - 0 Hearing / Language / Visual special needs []  - 0 Assessment of Community assistance (transportation, D/C planning, etc.) []  - 0 Additional assistance / Altered mentation []  -  0 Support Surface(s) Assessment (bed, cushion, seat, etc.) INTERVENTIONS - Wound Cleansing / Measurement [name], [name] ([id number]) X- 1 5 Simple Wound Cleansing - one wound []  - 0  Complex Wound Cleansing - multiple wounds X- 1 5 Wound Imaging (photographs - any number of wounds) []  - 0 Wound Tracing (instead of photographs) X- 1 5 Simple Wound Measurement - one wound []  - 0 Complex Wound Measurement - multiple wounds INTERVENTIONS - Wound Dressings []  - Small Wound Dressing one or multiple wounds 0 X- 1 15 Medium Wound Dressing one or multiple wounds []  - 0 Large Wound Dressing one or multiple wounds []  - 0 Application of Medications - topical []  - 0 Application of Medications - injection INTERVENTIONS - Miscellaneous []  - External ear exam 0 []  - 0 Specimen Collection (cultures, biopsies, blood, body fluids, etc.) []  - 0 Specimen(s) / Culture(s) sent or taken to Lab for analysis []  - 0 Patient Transfer (multiple staff / / Similar devices) []  - 0 Simple Staple / Suture removal (25 or less) []  - 0 Complex Staple / Suture removal (26 or more) []  - 0 Hypo / Hyperglycemic Management (close monitor of Blood Glucose) []  - 0 Ankle / Brachial Index (ABI) - do not check if billed separately X- 1 5 Vital Signs Has the patient been seen at the hospital within the last three years: Yes Total Score: 80 Level Of Care: New/Established - Level 3 Electronic Signature(s) Unsigned Entered By on 06/13/2022 16:03:36 Signature(s): Date(s): Buczynski, Ellan C ( ) -------------------------------------------------------------------------------- Encounter Discharge Information Details Patient Name: [name], [name] Date of Service: 06/13/2022 3:30 PM Medical Record Number: Patient Account Number: Date of Birth/Sex: 1961-12-05 (61 y.o. F) Treating RN: Nurse, adult Primary Care Devonna Oboyle: Other  Clinician: Referring Len Azeez: Card, John Treating Kamile Fassler/Extender: in Treatment: 24 Encounter Discharge Information Items Discharge Condition: Stable Ambulatory Status: Ambulatory Discharge Destination: Home Transportation: Private Auto Accompanied By: self Schedule Follow-up Appointment: Yes Clinical Summary of Care: Electronic Signature(s) Signed: 06/13/2022 4:07:38 PM By: Yevonne Pax RN Entered By: 06/15/2022 on 06/13/2022 16:07:38 Aguila, Bobette C. ([id number]) -------------------------------------------------------------------------------- Lower Extremity Assessment Details Patient Name: [name], [name] Date of Service: 06/13/2022 3:30 PM Medical Record Number: [id number] Patient Account Number: [id number] Date of Birth/Sex: October 02, 1961 (61 y.o. F) Treating RN: Yevonne Pax Primary Care Vester Balthazor: [name] Other Clinician: Referring Raykwon Hobbs: Card, John Treating Elizeth Weinrich/Extender: Rowan Blase in Treatment: 108 Electronic Signature(s) Unsigned Entered By: 06/15/2022 on 06/13/2022 15:51:17 Signature(s): Date(s): Pewitt, Lanyia C. (Yevonne Pax) -------------------------------------------------------------------------------- Multi Wound Chart Details Patient Name: [name], [name] Date of Service: 06/13/2022 3:30 PM Medical Record Number: [id number] Patient Account Number: 06/15/2022 Date of Birth/Sex: 11/07/1961 (61 y.o. F) Treating RN: 09/11/1961 Primary Care Jonee Lamore: 68 Other Clinician: Referring Sathvika Ojo: Card, John Treating Tashena Ibach/Extender: Yevonne Pax in Treatment: 34 Vital Signs Height(in): 58 Pulse(bpm): 74 Weight(lbs): 275 Blood Pressure(mmHg): 144/84 Body Mass Index(BMI): 57.5 Temperature(F): 98.2 Respiratory Rate(breaths/min): 18 Photos: [N/A:N/A] Wound Location: Distal, Midline Back N/A N/A Wounding Event: Surgical Injury N/A N/A Primary Etiology: Dehisced Wound N/A N/A Comorbid History:  Hypertension N/A N/A Date Acquired: 04/11/2021 N/A N/A Weeks of Treatment: 34 N/A N/A Wound Status: Open N/A N/A Wound Recurrence: No N/A N/A Measurements L x W x D (cm) 0.4x0.3x3.2 N/A N/A Area (cm) : 0.094 N/A N/A Volume (cm) : 0.302 N/A N/A % Reduction in Area: 25.40% N/A N/A % Reduction in Volume: 47.80% N/A N/A Classification: Full Thickness Without Exposed N/A N/A Support Structures Exudate Amount: Medium N/A N/A Exudate Type: Serous N/A N/A Exudate Color: amber N/A N/A Granulation Amount: Large (67-100%) N/A N/A Granulation Quality: Red N/A N/A Necrotic  Amount: None Present (0%) N/A N/A Exposed Structures: Fat Layer (Subcutaneous Tissue): N/A N/A Yes Fascia: No Tendon: No Muscle: No Joint: No Bone: No Epithelialization: None N/A N/A Treatment Notes Electronic Signature(s) Unsigned Entered ByCarlene Coria on 06/13/2022 15:51:31 Signature(s): Date(s): [place], Kinlie [name] Grayer ([id number]) -------------------------------------------------------------------------------- [place] Details Patient Name: [name], [name] Date of Service: 06/13/2022 3:30 PM Medical Record Number: [id number] Patient Account Number: [id number] Date of Birth/Sex: 02-16-1961 (61 y.o. F) Treating RN: Carlene Coria Primary Care Ritchard Paragas: Clayborn Heron Other Clinician: Referring Korbyn Vanes: Card, John Treating Malai Lady/Extender: Skipper Cliche in Treatment: 61 Active Inactive Wound/Skin Impairment Nursing Diagnoses: Knowledge deficit related to ulceration/compromised skin integrity Goals: Patient/caregiver will verbalize understanding of skin care regimen Date Initiated: 10/15/2021 Target Resolution Date: 06/15/2022 Goal Status: Active Ulcer/skin breakdown will have a volume reduction of 30% by week 4 Date Initiated: 10/15/2021 Date Inactivated: 01/29/2022 Target Resolution Date: 12/15/2021 Goal Status: Unmet Unmet Reason: comorbities Ulcer/skin breakdown will have a  volume reduction of 50% by week 8 Date Initiated: 10/15/2021 Date Inactivated: 01/29/2022 Target Resolution Date: 01/15/2022 Goal Status: Unmet Unmet Reason: comorbities Ulcer/skin breakdown will have a volume reduction of 80% by week 12 Date Initiated: 10/15/2021 Date Inactivated: 02/19/2022 Target Resolution Date: 02/15/2022 Goal Status: Unmet Unmet Reason: comorbities Ulcer/skin breakdown will heal within 14 weeks Date Initiated: 10/15/2021 Date Inactivated: 05/07/2022 Target Resolution Date: 03/15/2022 Goal Status: Unmet Unmet Reason: comorbities Interventions: Assess patient/caregiver ability to obtain necessary supplies Assess patient/caregiver ability to perform ulcer/skin care regimen upon admission and as needed Assess ulceration(s) every visit Notes: Electronic Signature(s) Unsigned Entered By: Carlene Coria on 06/13/2022 15:51:23 Signature(s): Date(s): [name], [name] ([id number]) -------------------------------------------------------------------------------- Pain Assessment Details Patient Name: [name], [name] Date of Service: 06/13/2022 3:30 PM Medical Record Number: [id number] Patient Account Number: [id number] Date of Birth/Sex: 08/17/1961 (61 y.o. F) Treating RN: Carlene Coria Primary Care Mishawn Didion: Clayborn Heron Other Clinician: Referring Richard Ritchey: Card, John Treating Fenton Candee/Extender: Skipper Cliche in Treatment: 38 Active Problems Location of Pain Severity and Description of Pain Patient Has Paino No Site Locations Pain Management and Medication Current Pain Management: Electronic Signature(s) Unsigned Entered ByCarlene Coria on 06/13/2022 15:50:04 Signature(s): Date(s): [name], [name] Grayer ([id number]) -------------------------------------------------------------------------------- Patient/Caregiver Education Details Patient Name: Forse, Shantee C. Date of Service: 06/13/2022 3:30 PM Medical Record Number: [id number] Patient Account Number:  [id number] Date of Birth/Gender: [birth year] (61 y.o. F) Treating RN: Carlene Coria Primary Care Physician: Clayborn Heron Other Clinician: Referring Physician: Card, John Treating Physician/Extender: Skipper Cliche in Treatment: 3 Education Assessment Education Provided To: Patient Education Topics Provided Wound/Skin Impairment: Methods: Explain/Verbal Responses: State content correctly Electronic Signature(s) Unsigned Entered By: Carlene Coria on 06/13/2022 16:03:56 Signature(s): Date(s): [name], [name] ([id number]) -------------------------------------------------------------------------------- Wound Assessment Details Patient Name: [name], [name] Date of Service: 06/13/2022 3:30 PM Medical Record Number: [id number] Patient Account Number: [id number] Date of Birth/Sex: 11/16/1961 (61 y.o. F) Treating RN: Carlene Coria Primary Care Elberta Lachapelle: Card, Jenny Reichmann Other Clinician: Referring Chris Narasimhan: Card, John Treating Kavaughn Faucett/Extender: Skipper Cliche in Treatment: 34 Wound Status Wound Number: 1 Primary Etiology: Dehisced Wound Wound Location: Distal, Midline Back Wound Status: Open Wounding Event: Surgical Injury Comorbid History: Hypertension Date Acquired: 04/11/2021 Weeks Of Treatment: 34 Clustered Wound: No Photos Wound Measurements Length: (cm) 0.4 Width: (cm) 0.3 Depth: (cm) 3.2 Area: (cm) 0.094 Volume: (cm) 0.302 % Reduction in Area: 25.4% % Reduction in Volume: 47.8% Epithelialization: None Tunneling: No Undermining: No Wound Description Classification: Full Thickness Without Exposed Support Structu Exudate Amount: Medium Exudate Type: Serous Exudate Color:  amber res [employer] Odor After Cleansing: No Slough/Fibrino No Wound Bed Granulation Amount: Large (67-100%) Exposed Structure Granulation Quality: Red Fascia Exposed: No Necrotic Amount: None Present (0%) Fat Layer (Subcutaneous Tissue) Exposed: Yes Tendon Exposed: No Muscle Exposed: No Joint  Exposed: No Bone Exposed: No Treatment Notes Wound #1 (Back) Wound Laterality: Midline, Distal Cleanser Peri-Wound Care Topical Primary Dressing ROSHANNA, CIMINO ([id number]) Secondary Dressing Secured With Compression Wrap Compression Stockings Add-Ons Electronic Signature(s) Unsigned Entered ByYevonne Pax on 06/13/2022 15:51:05 Signature(s): Date(s): Mcwherter, [name] CMarland Kitchen ([id number]) -------------------------------------------------------------------------------- Vitals Details Patient Name: Kimoto, Somya C. Date of Service: 06/13/2022 3:30 PM Medical Record Number: [id number] Patient Account Number: [id number] Date of Birth/Sex: Feb 16, 1961 (61 y.o. F) Treating RN: Yevonne Pax Primary Care Sherry Rogus: [name] Other Clinician: Referring Zarriah Starkel: Card, John Treating Attie Nawabi/Extender: Rowan Blase in Treatment: 34 Vital Signs Time Taken: 15:47 Temperature (F): 98.2 Height (in): 58 Pulse (bpm): 74 Weight (lbs): 275 Respiratory Rate (breaths/min): 18 Body Mass Index (BMI): 57.5 Blood Pressure (mmHg): 144/84 Reference Range: 80 - 120 mg / dl Electronic Signature(s) Unsigned Entered ByYevonne Pax on 06/13/2022 15:49:49 Signature(s): Date(s):

## 2022-06-13 NOTE — Progress Notes (Signed)
MISKI, FELDPAUSCH ([id number]) Visit Report for 06/13/2022 Chief Complaint Document Details Patient Name: [name], [name] Date of Service: 06/13/2022 3:30 PM Medical Record Number: [id number] Patient Account Number: [id number] Date of Birth/Sex: 09-01-1961 (61 y.o. F) Treating RN: Yevonne Pax Primary Care Provider: [name] Other Clinician: Referring Provider: Card, John Treating Provider/Extender: Rowan Blase in Treatment: 32 Information Obtained from: Patient Chief Complaint Surgical Back Ulcer Electronic Signature(s) Signed: 06/13/2022 3:40:06 PM By: [name] PA-C Entered By: [name] on 06/13/2022 15:40:06 [name], [name] ([id number]) -------------------------------------------------------------------------------- HPI Details Patient Name: [name], [name] Date of Service: 06/13/2022 3:30 PM Medical Record Number: [id number] Patient Account Number: [id number] Date of Birth/Sex: [birth year] (61 y.o. 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 This was actually broken up into 3 separate surgical events. The initial surgical intervention actually was on November 05, 2021 almost a year ago. Subsequently the patient went back in February for a seroma of the area which unfortunately required her to have a repeat surgery to go in and clean this out. And then again this occurred in April where she went back in and again they felt like stitches were coming out and there was an additional seroma. She was placed in a wound VAC initially and then subsequently as it got smaller that  was discontinued. Again right now I will see anything that I think a wound VAC would help with. Nonetheless she definitely has a significant depth to the wound that is going require packing. I actually believe the Hydrofera Blue rope would probably do quite well with this the problem is as much as it is draining she probably needs this to be changed at least every day. She does not really have anyone that can help with that that is the complicating scenario here. With that being said the patient does have a history of multiple sclerosis, hypertension, and again this surgical wound dehiscence in regard to her lumbar spine region. She did have a repeat MRI which was actually completed 10/09/2021. This showed that there was no significant change in the subcutaneous fluid collection/track of the lower lumbar region. This is extending to the level of the fascia unfortunately. This seems to go all the way from the L2 level with a track extending all the way to the fascia at the L4-5 level. Again this is a significant wound and there is significant drainage but does not seem to communicate to the spinal region as far as spinal fluid or otherwise is concerned that is good news. Nonetheless she last saw Dr. Franky Macho who is her neurosurgeon on 10/01/2021 that was when he ordered this last MRI she supposed to see him next week as well. With that being said he did not feel like there was any significant issue there but was not sure why this was not healing that is when he ordered the MRI. They were wanting to make sure that this was packed appropriately by home health unfortunately the main issue currently is that home health is completely out of the picture as the patient has exhausted all the home health that that she gets for a year. She is now in a very difficult  predicament where she does not have anyone to help her change the dressing and to be honest that she is not able to do it herself with the location of  the wound being on the midline lumbar spine region. If she does not have anyone that can help it is probably can to be necessary for her to go to a facility for rehab and daily dressing changes as I feel like daily changes which is much drainage that she is having is going to be necessary. 10/23/2021 upon evaluation today patient appears to be doing decently well in regard to her back ulcer. This does seem to be draining a lot less than what it is been doing in the past. With that being said she still has quite a bit of drainage nonetheless. I do think that given time this should improve least I hope so. The good news is she does have home health coming out 3 days a week were doing it 2 days a week and she is paying someone we can to help. 10/30/2021 upon evaluation today patient appears to be doing okay in regard to her back ulcer this is not draining quite as bad as it was in the beginning but he still has quite a bit of drainage noted. I do believe that the patient would benefit from [place] going ahead forward with attempting a wound VAC using the Hydrofera Blue rope to pack with and then subsequently using the VAC externally to actually suction out and help this to fill- in. I think this is our ideal way to try to get things cleared at this point. As it stands I am not certain that we are really making a progress that we want to see near with doing it in the way we are which is packing with the rope. It is a good dressing but I do think it is insufficient for total healing. She just seems to have too much in the way of drainage at this point unfortunately. 11/06/2021 upon evaluation today patient unfortunately continues to have issues with her back ongoing. The good news is her MRI that was repeated showed signs of the size of this area in the lumbar spine region having decreased from 4 cm to 3.5 cm this is definitely not bad news at all. With that being said unfortunately she continues to have issues  with ongoing drainage not as severe as in the beginning but nonetheless still significant. I do think a wound VAC still would be a good way to go although her home health agency nurse apparently has some concerns about the possibility of not being able to keep a seal with this as they had struggles in the past. Nonetheless I explained to the patient that this is much different than what she had previous and that I really feel like it would do much better as far as getting the area taken care of without having any complications or issues here. I think that we should be able to maintain a seal. Nonetheless at this time I did discuss with the patient as well that she probably does need to have a wound VAC in order for [place] to get this moving in the right direction. 11/13/2021 upon evaluation patient's wound bed actually showed signs of significant drainage at this time. She did see the surgeon yesterday he did not see anything that appeared to be infected. Nonetheless he does appear that she is continuing to have areas here that just do not seem  to want to seal up there MRI findings have been negative but nonetheless she continues to have is the seroma that is filling in. I do feel like we need to try to widen the hole so we can get at least a half of the [facility] then this will be better than nothing at this point. 11/20/2021 upon evaluation today patient actually appears to be having less pain at this point which is good news and overall she we still do not have the results of the culture back yet it had to be sent out to [facility] and we do not have the result back yet. Is doing decently well in regard to her wound. Fortunately there does not appear to be any signs of active infection systemically nor locally at this time. 11/27/2021 upon evaluation today patient appears to be doing well with regard to her wound all things considered there does appear to be less drainage than there was previous.  Fortunately I do not see any evidence of worsening of the patient is stating that she is having some issues with back pain. This is somewhat new. Again this I think could be related to the fact that she is having some issues here with infection. We are still waiting to see what the result of her culture shows from susceptibility testing Enterococcus has been identified but we do not know if this is VRE or not. 12/04/2021 upon evaluation today patient appears to be doing well with regard to her wound all things considered. I did have a conversation with Erin from Dr. Sueanne Margarita office. Of note she notes that Dr. Drinda Butts really does not want to do anything surgical right now which I completely understand. With that being said I am still leery of how far we will make it getting this to heal short of any type of surgery to open this up and allow [place] to more appropriately packed the wound. Nonetheless I will absolutely give it our best shot as far as that is concerned. I discussed that with the patient today. She voiced understanding. The good news is the drainage today seems to be clear it is no longer cloudy as it was previous I am actually very pleased in that regard. SHAQUANTA, HARKLESS ([id number]) 12/11/2021 since have last seen the patient actually did have an conversation with Dr. Franky Macho with her neurosurgeon. Again this involve the discussion around whether or not to open the wound and try to apply a wound VAC following. With that being said the decision was made that that may be the best thing to do if the patient was in agreement. Nonetheless I am actually extremely encouraged with what I am seeing today much more than I would have thought. In fact I think that we may be over packing the wound which is why she is having some discomfort at this point as there is much less of the dressing able to get and this time compared to what we saw previous. That is actually really good news. In fact the 6:00  tunnel appears to have filled in is awesome news. At the 12:00 location I am actually able to pack into that area pretty effectively at this point today. In fact I was able to get less of the packing in which I will detail below. 12/18/2021 upon evaluation today patient appears to be doing well with regard to her wound on the back. Fortunately there is no signs of active infection which is great news and overall  very pleased with where things stand today. No fevers, chills, nausea, vomiting, or diarrhea. 01/01/2022 upon evaluation today patient appears to be doing decently well in regard to her wound. Fortunately there does not appear to be any signs of anything worsening which is great news. She still continues to have issues with drainage but this is not nearly as significant as what it was in the past. There is all clear drainage no signs of purulence noted. 01/08/2022 upon evaluation today patient appears to be doing well with regard to her wound. With that being said she tells me that she has been having some increased pain over the past week. It does appear based on the amount of Hydrofera Blue that we removed this is probably over pack. Again it is difficult to know exactly how old the nurses getting all the skin but nonetheless the length of Hydrofera Blue that was utilized was way too much which may be part of the reason why this felt so uncomfortable. Fortunately I think that we can cut back on that we discussed pieces smaller so hopefully that will not be over packed. 01/22/2021 upon evaluation today patient appears to be doing well With regard to there being no signs of infection at this time. The wound does still tunnel mainly up at the 12:00 location. The depth of the tunnel complete from entry point to the base is about 3.5 cm today. With that being said I do believe that overall she is making good progress this is just a very slow process for her which I know has been frustrating as  well. 01/29/2022 upon evaluation today patient appears to be doing well with regard to the wound on her lower back and the lumbar spine region. The good news is the depth that I got this week was a little bit less going up at the 12:00 location as compared to last week. I measured this to be 3.5 cm last week and it was 3.3 cm this week. Again that is a minute shift but nonetheless a good shift in the right direction. Overall I think that we are on the right track. 02/05/2022 upon evaluation today patient appears to be doing well with regard to her wound. In fact right now her measurements are somewhere around 2.9 cm which is definitely smaller and less deep than last week At the 12:00 location. Overall I am very pleased with what we are seeing. 02/19/2022 upon evaluation today patient unfortunately is noting that the wound is actually closing up externally but internally there is still some depth to this. I discussed with her today that we cannot let it close up like this is can end up being a significant issue for her if we allow for that. Subsequently we need to do what we can to try to open this up and keep it open more effectively. She voiced understanding. With that being said we are going to proceed with that procedure today in order to debride away some of the skin on externally that is trying to close and on this. 02/26/2022 upon evaluation patient appears to be doing better in my opinion after we had to open up this area last week. Again she has a significant amount of healing and overall I am extremely pleased with where things stand currently. I do not see any evidence of active infection locally nor systemically at this time which is great news and in general I think that we are on the right track for getting  this hopefully closed final and done. 03/05/2022 upon evaluation today patient unfortunately is doing a little bit worse with regard to the whole size this is starting to get much  smaller unfortunately. There does not appear to be any signs of active infection locally nor systemically which is great news. With that being said I am concerned about the fact that the patient is doing quite a bit worse with regard to trapping some fluid and almost feels like she may have a fluid pocket that not only goes in and up towards 12:00 but looks back around towards the more superficial subcutaneous tissue. I am very concerned that this is going to be something that is very hard for [place] to heal short of another surgery to open this up and try to wound VAC from the inside out. Even then there is no guarantees this is just a very difficult situation to be perfectly honest. 03/14/2022 upon evaluation today patient appears to be doing well with regard to her wound as far as the area staying open and pleased in that regard. With that being said unfortunately she is not doing as well when it comes to the irritation around it appears to be very inflamed and red this is not good that was discussed with her today as well. Subsequently I do believe that working to need to do what we can do to try and calm this down in the past she did well with the Augmentin due to Enterococcus infection I am not sure if were dealing with the same thing or not but at the same time I think that is probably to be my go to at this point. 03/19/2022 upon evaluation today patient appears to be doing really about the same in regard to her wound that the pain is better. 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 She seems to be improved dramatically with the antibiotic currently although it does not cover for MRSA I am apt to just see how this progresses over the next several days to a week. With that being said the bigger issue here is that we simply do not seem to be making excellent progress and I am concerned about the lack of progress. I discussed with Dr. Shon Baton with [facility] in [place]  and to be honest his recommendation was that the best bet would probably be to get her to a teaching center. She is actually been seen for rheumatology and a I believe psychologist/psychiatrist at [facility]. She would prefer to go that direction as opposed to [facility] or [facility]. I am definitely okay with that. 03/26/2022 upon evaluation today patient appears to be doing okay in regard to her wound. She is not showing signs of worsening and overall she tells me that her pain is not nearly as bad as it was previous. Fortunately I do not see any evidence of active infection locally or systemically which is great news. No fevers, chills, nausea, vomiting, or diarrhea. 04-02-2022 upon evaluation today patient appears to be doing well with regard to her wound. It does not appear to be showing any signs of worsening which is great news. With that being said you are still having some issues here with drainage although it is clear and mainly just tenderness with bleeding from the [facility] I do think that treating for the second issue which is MRSA is probably the right thing to do at this point she is now off of the Augmentin. 04-09-2022 upon  evaluation today patient's wound actually appears to be doing about the same. Fortunately I do not see any evidence of active infection locally or systemically at this time which is great news. No fevers, chills, nausea, vomiting, or diarrhea. 04-16-2022 upon evaluation today patient appears to be doing well with regard to her wound in the lumbar spine region. Subsequently she continues to have an area that does have a tunnel up into the 12:00 location. This is about 3.5 cm using a skinny probe curved upwards to get into this region. With that being said I really do not see any signs of overall improvement but also do not see any signs of worsening I feel like work on about a still made here to some degree. The patient did see her neurologist and he has referred her to  neurosurgery at [facility] for second ULYANA, HARDBARGER. ([id number]) opinion we will see what they have to say as well. 04-23-2022 upon evaluation today patient appears to be doing well with regard to her wound all things considered. She has been tolerating the dressing changes without complication. Fortunately I do not see any signs of active infection locally nor systemically which is great news. No fever chills noted 04-30-2022 upon evaluation today patient appears to be doing about the same in regard to her wound. The depth is really not dramatically improved as far as the 12:00 tunnel is concerned. The overall depth and the base of the wound does seem to be a little bit better it does sound like that external wound has closed as far as the opening is concerned we can have to cut this down from a half to a smaller size based on what we are seeing today. 05-07-2022 upon evaluation today patient's wound actually seems to be doing decently well. There is not a major shift here but a slight shift in the depth both straight and as well as in the [place] at 12:00. With that being said I again we will talk about a couple of millimeters but still every little bit can count when there are situations like this to be honest. 05-14-2022 upon evaluation today patient appears to be doing okay in regard to her wound. I do not see any signs of anything worsening. With that being said also do not see getting significantly better unfortunately. There does not appear to be any evidence of infection locally or systemically which is great news. No fevers, chills, nausea, vomiting, or diarrhea. 05-21-2022 upon evaluation today patient actually appears to be doing quite well in regard to her wound from the standpoint of there being no infection. With that being said there does not appear to be any evidence of infection locally nor systemically which is great news. No fevers, chills, nausea, vomiting, or diarrhea. 05-28-2022  upon evaluation today patient's wound actually appears to be doing about the same. I do not see any evidence of active infection locally or systemically which is great news. No fevers, chills, nausea, vomiting, or diarrhea. 06-04-2022 upon evaluation today patient appears to be doing well with regard to her wound. She has been tolerating dressing changes without complication. Fortunately I do not feel like there is any worsening also I do not feel like there is any improvement. I feel like right a solid area here where she has a tunnel that tracks up at 12:00 and then has a fluid pocket at around roughly 1:00 I can push on this and squeeze fluid out. Again I am not exactly sure  what else can be done from my standpoint to improve this. She does have an appointment with Dr. Franky Macho who I do believe is an excellent surgeon and this is the surgeon who performed this surgery initially. Again the biggest issue was following she had a lot of trouble with the wound VAC I do think that we need to try to see if she is can have a wound VAC following surgery what exactly regular need to do to help keep this moving in the right direction for her. Obviously I want her to have the best result possible that she has to go through surgery again to clean this area out. Absolutely willing to help out with getting this to heal. 06-13-2022 upon evaluation today patient appears to be doing well with regard to her wound its not any worse unfortunately it is also not significantly better which is the only issue currently. I do not see any signs of active infection locally or systemically which is great news. No fevers, chills, nausea, vomiting, or diarrhea. She does have her appointment on Monday with her neurosurgeon and we will see you Dr. Franky Macho has to say at that point. Electronic Signature(s) Signed: 06/13/2022 4:03:55 PM By: [name] PA-C Entered By: [name] on 06/13/2022 16:03:55 [name], [name]  ([id number]) -------------------------------------------------------------------------------- Physical Exam Details Patient Name: [name], [name] Date of Service: 06/13/2022 3:30 PM Medical Record Number: [id number] Patient Account Number: [id number] Date of Birth/Sex: 04/18/1961 (61 y.o. F) Treating RN: Yevonne Pax Primary Care Provider: [name] Other Clinician: Referring Provider: Card, John Treating Provider/Extender: Rowan Blase in Treatment: 34 Constitutional Obese and well-hydrated in no acute distress. Respiratory normal breathing without difficulty. Psychiatric this patient is able to make decisions and demonstrates good insight into disease process. Alert and Oriented x 3. pleasant and cooperative. Notes Upon inspection patient still has depth that travels into the roughly 1:00 location. I did use a skinny probe in order to curve to get up into the area when I do so it does seem to express fluid from this location which in turn helps to alleviate some of the pressure. Nonetheless I did draw a mark on her back that is in permanent marker so that they can see when Dr. Franky Macho sees her on Monday where when you push at this location what happens in the fluid comes out. Also gave her a skinny probe to take with her in order for them to have what is needed to evaluate the area if the desires. Electronic Signature(s) Signed: 06/13/2022 4:04:41 PM By: [name] PA-C Entered By: [name] on 06/13/2022 16:04:41 [name], [name] ([id number]) -------------------------------------------------------------------------------- Physician Orders Details Patient Name: [name], [name] Date of Service: 06/13/2022 3:30 PM Medical Record Number: [id number] Patient Account Number: [id number] Date of Birth/Sex: 1961-04-30 (61 y.o. 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 o Nurse Visit as needed Home Health o Home Health Company: [facility] o [facility] for wound care. May utilize formulary equivalent dressing for wound treatment orders unless otherwise specified. Home Health Nurse may visit PRN to address patientos wound  care needs. - Monday and Friday [facility] fax [phone] Bathing/ Shower/ Hygiene o May shower; gently cleanse wound with antibacterial soap, rinse and pat dry prior to dressing wounds o No tub bath. Anesthetic (Use 'Patient Medications' Section for Anesthetic Order Entry) o Lidocaine applied to wound bed Edema Control - Lymphedema / Segmental Compressive Device / Other o Elevate, Exercise Daily and Avoid Standing for Long Periods of Time. o Elevate legs to the level of the heart and pump ankles as often as possible o Elevate leg(s) parallel to the floor when sitting. Additional Orders / Instructions o Follow Nutritious Diet and Increase Protein Intake Wound Treatment Wound #1 - Back Wound Laterality: Midline, Distal Cleanser: Normal Saline 1 x Per Day/30 Days Discharge Instructions: Wash your hands with soap and water. Remove old dressing, discard into plastic bag and place into trash. Cleanse the wound with Normal Saline prior to applying a clean dressing using gauze sponges, not tissues or cotton balls. Do not scrub or use excessive force. Pat dry using gauze sponges, not tissue or cotton balls. Primary Dressing: Hydrofera Blue Rope 1 x Per Day/30 Days Discharge Instructions: cut into fourths; angle the  end Secondary Dressing: (SILICONE BORDER) Zetuvit Plus SILICONE BORDER Dressing 4x4 (in/in) 1 x Per Day/30 Days Electronic Signature(s) Signed: 06/13/2022 4:11:01 PM By: [name] PA-C Signed: 06/19/2022 2:40:31 PM By: Yevonne Pax RN Entered By: Yevonne Pax on 06/13/2022 15:54:09 [name], [name] ([id number]) -------------------------------------------------------------------------------- Problem List Details Patient Name: Tesar, Brytnee C. Date of Service: 06/13/2022 3:30 PM Medical Record Number: [id number] Patient Account Number: [id number] Date of Birth/Sex: 01-15-1961 (61 y.o. 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 ([id number]) -------------------------------------------------------------------------------- Progress Note Details Patient Name: [name], [name] Date of Service: 06/13/2022 3:30 PM Medical Record Number: [id number] Patient Account Number: [id number] Date of Birth/Sex: 11/10/1961 (61 y.o. F) Treating RN: Yevonne Pax Primary Care Provider: [name] Other Clinician: Referring Provider: Card, John Treating Provider/Extender: Rowan Blase in Treatment: 34 Subjective Chief Complaint Information obtained from Patient Surgical Back Ulcer History of Present  Illness (HPI) 10/15/2021 upon evaluation today patient presents for initial evaluation here in the clinic concerning a surgical ulceration/dehiscence in the lumbar spine region following surgery that she had over the past year. This was actually broken up into 3 separate surgical events. The initial surgical intervention actually was on November 05, 2021 almost a year ago. Subsequently the patient went back in February for a seroma of the area which unfortunately required her to have a repeat surgery to go in and clean this out. And then again this occurred in April where she went back in and again they felt like stitches were coming out and there was an additional seroma. She was placed in a wound VAC initially and then subsequently as it got smaller that was discontinued. Again right now I will see anything that I think a wound VAC would help with. Nonetheless she definitely has a significant depth to  the wound that is going require packing. I actually believe the Hydrofera Blue rope would probably do quite well with this the problem is as much as it is draining she probably needs this to be changed at least every day. She does not really have anyone that can help with that that is the complicating scenario here. With that being said the patient does have a history of multiple sclerosis, hypertension, and again this surgical wound dehiscence in regard to her lumbar spine region. She did have a repeat MRI which was actually completed 10/09/2021. This showed that there was no significant change in the subcutaneous fluid collection/track of the lower lumbar region. This is extending to the level of the fascia unfortunately. This seems to go all the way from the L2 level with a track extending all the way to the fascia at the L4-5 level. Again this is a significant wound and there is significant drainage but does not seem to communicate to the spinal region as far as spinal fluid or otherwise is concerned  that is good news. Nonetheless she last saw Dr. Franky Macho who is her neurosurgeon on 10/01/2021 that was when he ordered this last MRI she supposed to see him next week as well. With that being said he did not feel like there was any significant issue there but was not sure why this was not healing that is when he ordered the MRI. They were wanting to make sure that this was packed appropriately by home health unfortunately the main issue currently is that home health is completely out of the picture as the patient has exhausted all the home health that that she gets for a year. She is now in a very difficult predicament where she does not have anyone to help her change the dressing and to be honest that she is not able to do it herself with the location of the wound being on the midline lumbar spine region. If she does not have anyone that can help it is probably can to be necessary for her to go to a facility for rehab and daily dressing changes as I feel like daily changes which is much drainage that she is having is going to be necessary. 10/23/2021 upon evaluation today patient appears to be doing decently well in regard to her back ulcer. This does seem to be draining a lot less than what it is been doing in the past. With that being said she still has quite a bit of drainage nonetheless. I do think that given time this should improve least I hope so. The good news is she does have home health coming out 3 days a week were doing it 2 days a week and she is paying someone we can to help. 10/30/2021 upon evaluation today patient appears to be doing okay in regard to her back ulcer this is not draining quite as bad as it was in the beginning but he still has quite a bit of drainage noted. I do believe that the patient would benefit from [place] going ahead forward with attempting a wound VAC using the Hydrofera Blue rope to pack with and then subsequently using the VAC externally to actually suction out and  help this to fill- in. I think this is our ideal way to try to get things cleared at this point. As it stands I am not certain that we are really making a progress that we want to see near with doing it in the way  we are which is packing with the rope. It is a good dressing but I do think it is insufficient for total healing. She just seems to have too much in the way of drainage at this point unfortunately. 11/06/2021 upon evaluation today patient unfortunately continues to have issues with her back ongoing. The good news is her MRI that was repeated showed signs of the size of this area in the lumbar spine region having decreased from 4 cm to 3.5 cm this is definitely not bad news at all. With that being said unfortunately she continues to have issues with ongoing drainage not as severe as in the beginning but nonetheless still significant. I do think a wound VAC still would be a good way to go although her home health agency nurse apparently has some concerns about the possibility of not being able to keep a seal with this as they had struggles in the past. Nonetheless I explained to the patient that this is much different than what she had previous and that I really feel like it would do much better as far as getting the area taken care of without having any complications or issues here. I think that we should be able to maintain a seal. Nonetheless at this time I did discuss with the patient as well that she probably does need to have a wound VAC in order for [place] to get this moving in the right direction. 11/13/2021 upon evaluation patient's wound bed actually showed signs of significant drainage at this time. She did see the surgeon yesterday he did not see anything that appeared to be infected. Nonetheless he does appear that she is continuing to have areas here that just do not seem to want to seal up there MRI findings have been negative but nonetheless she continues to have is the seroma that  is filling in. I do feel like we need to try to widen the hole so we can get at least a half of the [facility] then this will be better than nothing at this point. 11/20/2021 upon evaluation today patient actually appears to be having less pain at this point which is good news and overall she we still do not have the results of the culture back yet it had to be sent out to [facility] and we do not have the result back yet. Is doing decently well in regard to her wound. Fortunately there does not appear to be any signs of active infection systemically nor locally at this time. 11/27/2021 upon evaluation today patient appears to be doing well with regard to her wound all things considered there does appear to be less drainage than there was previous. Fortunately I do not see any evidence of worsening of the patient is stating that she is having some issues with back pain. This is somewhat new. Again this I think could be related to the fact that she is having some issues here with infection. We are still waiting to see what the result of her culture shows from susceptibility testing Enterococcus has been identified but we do not know if this is VRE or not. 12/04/2021 upon evaluation today patient appears to be doing well with regard to her wound all things considered. I did have a conversation with Erin from Dr. Sueanne Margarita office. Of note she notes that Dr. Drinda Butts really does not want to do anything surgical right now which I completely [place], Jaylon C. ([id number]) understand. With that being said I am still leery  of how far we will make it getting this to heal short of any type of surgery to open this up and allow [place] to more appropriately packed the wound. Nonetheless I will absolutely give it our best shot as far as that is concerned. I discussed that with the patient today. She voiced understanding. The good news is the drainage today seems to be clear it is no longer cloudy as it was previous I  am actually very pleased in that regard. 12/11/2021 since have last seen the patient actually did have an conversation with Dr. Franky Macho with her neurosurgeon. Again this involve the discussion around whether or not to open the wound and try to apply a wound VAC following. With that being said the decision was made that that may be the best thing to do if the patient was in agreement. Nonetheless I am actually extremely encouraged with what I am seeing today much more than I would have thought. In fact I think that we may be over packing the wound which is why she is having some discomfort at this point as there is much less of the dressing able to get and this time compared to what we saw previous. That is actually really good news. In fact the 6:00 tunnel appears to have filled in is awesome news. At the 12:00 location I am actually able to pack into that area pretty effectively at this point today. In fact I was able to get less of the packing in which I will detail below. 12/18/2021 upon evaluation today patient appears to be doing well with regard to her wound on the back. Fortunately there is no signs of active infection which is great news and overall very pleased with where things stand today. No fevers, chills, nausea, vomiting, or diarrhea. 01/01/2022 upon evaluation today patient appears to be doing decently well in regard to her wound. Fortunately there does not appear to be any signs of anything worsening which is great news. She still continues to have issues with drainage but this is not nearly as significant as what it was in the past. There is all clear drainage no signs of purulence noted. 01/08/2022 upon evaluation today patient appears to be doing well with regard to her wound. With that being said she tells me that she has been having some increased pain over the past week. It does appear based on the amount of Hydrofera Blue that we removed this is probably over pack. Again it is  difficult to know exactly how old the nurses getting all the skin but nonetheless the length of Hydrofera Blue that was utilized was way too much which may be part of the reason why this felt so uncomfortable. Fortunately I think that we can cut back on that we discussed pieces smaller so hopefully that will not be over packed. 01/22/2021 upon evaluation today patient appears to be doing well With regard to there being no signs of infection at this time. The wound does still tunnel mainly up at the 12:00 location. The depth of the tunnel complete from entry point to the base is about 3.5 cm today. With that being said I do believe that overall she is making good progress this is just a very slow process for her which I know has been frustrating as well. 01/29/2022 upon evaluation today patient appears to be doing well with regard to the wound on her lower back and the lumbar spine region. The good news is the depth  that I got this week was a little bit less going up at the 12:00 location as compared to last week. I measured this to be 3.5 cm last week and it was 3.3 cm this week. Again that is a minute shift but nonetheless a good shift in the right direction. Overall I think that we are on the right track. 02/05/2022 upon evaluation today patient appears to be doing well with regard to her wound. In fact right now her measurements are somewhere around 2.9 cm which is definitely smaller and less deep than last week At the 12:00 location. Overall I am very pleased with what we are seeing. 02/19/2022 upon evaluation today patient unfortunately is noting that the wound is actually closing up externally but internally there is still some depth to this. I discussed with her today that we cannot let it close up like this is can end up being a significant issue for her if we allow for that. Subsequently we need to do what we can to try to open this up and keep it open more effectively. She voiced understanding.  With that being said we are going to proceed with that procedure today in order to debride away some of the skin on externally that is trying to close and on this. 02/26/2022 upon evaluation patient appears to be doing better in my opinion after we had to open up this area last week. Again she has a significant amount of healing and overall I am extremely pleased with where things stand currently. I do not see any evidence of active infection locally nor systemically at this time which is great news and in general I think that we are on the right track for getting this hopefully closed final and done. 03/05/2022 upon evaluation today patient unfortunately is doing a little bit worse with regard to the whole size this is starting to get much smaller unfortunately. There does not appear to be any signs of active infection locally nor systemically which is great news. With that being said I am concerned about the fact that the patient is doing quite a bit worse with regard to trapping some fluid and almost feels like she may have a fluid pocket that not only goes in and up towards 12:00 but looks back around towards the more superficial subcutaneous tissue. I am very concerned that this is going to be something that is very hard for [place] to heal short of another surgery to open this up and try to wound VAC from the inside out. Even then there is no guarantees this is just a very difficult situation to be perfectly honest. 03/14/2022 upon evaluation today patient appears to be doing well with regard to her wound as far as the area staying open and pleased in that regard. With that being said unfortunately she is not doing as well when it comes to the irritation around it appears to be very inflamed and red this is not good that was discussed with her today as well. Subsequently I do believe that working to need to do what we can do to try and calm this down in the past she did well with the Augmentin due to  Enterococcus infection I am not sure if were dealing with the same thing or not but at the same time I think that is probably to be my go to at this point. 03/19/2022 upon evaluation today patient appears to be doing really about the same in regard to her  wound that the pain is better. 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 She seems to be improved dramatically with the antibiotic currently although it does not cover for MRSA I am apt to just see how this progresses over the next several days to a week. With that being said the bigger issue here is that we simply do not seem to be making excellent progress and I am concerned about the lack of progress. I discussed with Dr. Shon Baton with [facility] in [place] and to be honest his recommendation was that the best bet would probably be to get her to a teaching center. She is actually been seen for rheumatology and a I believe psychologist/psychiatrist at [facility]. She would prefer to go that direction as opposed to [facility] or [facility]. I am definitely okay with that. 03/26/2022 upon evaluation today patient appears to be doing okay in regard to her wound. She is not showing signs of worsening and overall she tells me that her pain is not nearly as bad as it was previous. Fortunately I do not see any evidence of active infection locally or systemically which is great news. No fevers, chills, nausea, vomiting, or diarrhea. 04-02-2022 upon evaluation today patient appears to be doing well with regard to her wound. It does not appear to be showing any signs of worsening which is great news. With that being said you are still having some issues here with drainage although it is clear and mainly just tenderness with bleeding from the [facility] I do think that treating for the second issue which is MRSA is probably the right thing to do at this point she is now off of the Augmentin. 04-09-2022 upon evaluation today patient's wound  actually appears to be doing about the same. Fortunately I do not see any evidence of active infection locally or systemically at this time which is great news. No fevers, chills, nausea, vomiting, or diarrhea. [name], [name] ([id number]) 04-16-2022 upon evaluation today patient appears to be doing well with regard to her wound in the lumbar spine region. Subsequently she continues to have an area that does have a tunnel up into the 12:00 location. This is about 3.5 cm using a skinny probe curved upwards to get into this region. With that being said I really do not see any signs of overall improvement but also do not see any signs of worsening I feel like work on about a still made here to some degree. The patient did see her neurologist and he has referred her to neurosurgery at [facility] for second opinion we will see what they have to say as well. 04-23-2022 upon evaluation today patient appears to be doing well with regard to her wound all things considered. She has been tolerating the dressing changes without complication. Fortunately I do not see any signs of active infection locally nor systemically which is great news. No fever chills noted 04-30-2022 upon evaluation today patient appears to be doing about the same in regard to her wound. The depth is really not dramatically improved as far as the 12:00 tunnel is concerned. The overall depth and the base of the wound does seem to be a little bit better it does sound like that external wound has closed as far as the opening is concerned we can have to cut this down from a half to a smaller size based on what we are seeing today. 05-07-2022 upon evaluation today patient's wound actually seems to be  doing decently well. There is not a major shift here but a slight shift in the depth both straight and as well as in the [place] at 12:00. With that being said I again we will talk about a couple of millimeters but still every little bit can count when  there are situations like this to be honest. 05-14-2022 upon evaluation today patient appears to be doing okay in regard to her wound. I do not see any signs of anything worsening. With that being said also do not see getting significantly better unfortunately. There does not appear to be any evidence of infection locally or systemically which is great news. No fevers, chills, nausea, vomiting, or diarrhea. 05-21-2022 upon evaluation today patient actually appears to be doing quite well in regard to her wound from the standpoint of there being no infection. With that being said there does not appear to be any evidence of infection locally nor systemically which is great news. No fevers, chills, nausea, vomiting, or diarrhea. 05-28-2022 upon evaluation today patient's wound actually appears to be doing about the same. I do not see any evidence of active infection locally or systemically which is great news. No fevers, chills, nausea, vomiting, or diarrhea. 06-04-2022 upon evaluation today patient appears to be doing well with regard to her wound. She has been tolerating dressing changes without complication. Fortunately I do not feel like there is any worsening also I do not feel like there is any improvement. I feel like right a solid area here where she has a tunnel that tracks up at 12:00 and then has a fluid pocket at around roughly 1:00 I can push on this and squeeze fluid out. Again I am not exactly sure what else can be done from my standpoint to improve this. She does have an appointment with Dr. Franky Macho who I do believe is an excellent surgeon and this is the surgeon who performed this surgery initially. Again the biggest issue was following she had a lot of trouble with the wound VAC I do think that we need to try to see if she is can have a wound VAC following surgery what exactly regular need to do to help keep this moving in the right direction for her. Obviously I want her to have the best  result possible that she has to go through surgery again to clean this area out. Absolutely willing to help out with getting this to heal. 06-13-2022 upon evaluation today patient appears to be doing well with regard to her wound its not any worse unfortunately it is also not significantly better which is the only issue currently. I do not see any signs of active infection locally or systemically which is great news. No fevers, chills, nausea, vomiting, or diarrhea. She does have her appointment on Monday with her neurosurgeon and we will see you Dr. Franky Macho has to say at that point. Objective Constitutional Obese and well-hydrated in no acute distress. Vitals Time Taken: 3:47 PM, Height: 58 in, Weight: 275 lbs, BMI: 57.5, Temperature: 98.2 F, Pulse: 74 bpm, Respiratory Rate: 18 breaths/min, Blood Pressure: 144/84 mmHg. Respiratory normal breathing without difficulty. Psychiatric this patient is able to make decisions and demonstrates good insight into disease process. Alert and Oriented x 3. pleasant and cooperative. General Notes: Upon inspection patient still has depth that travels into the roughly 1:00 location. I did use a skinny probe in order to curve to get up into the area when I do so it does seem to  express fluid from this location which in turn helps to alleviate some of the pressure. Nonetheless I did draw a mark on her back that is in permanent marker so that they can see when Dr. Franky Macho sees her on Monday where when you push at this location what happens in the fluid comes out. Also gave her a skinny probe to take with her in order for them to have what is needed to evaluate the area if the desires. Integumentary (Hair, Skin) Wound #1 status is Open. Original cause of wound was Surgical Injury. The date acquired was: 04/11/2021. The wound has been in treatment 34 weeks. The wound is located on the Distal,Midline Back. The wound measures 0.4cm length x 0.3cm width x 3.2cm depth;  0.094cm^2 area and [name], [name] ([id number]) 0.302cm^3 volume. There is Fat Layer (Subcutaneous Tissue) exposed. There is no tunneling or undermining noted. There is a medium amount of serous drainage noted. There is large (67-100%) red granulation within the wound bed. There is no necrotic tissue within the wound bed. 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 Nurse Visit as needed Home Health: Home Health Company: - [facility] for wound care. May utilize formulary equivalent dressing for wound treatment orders unless otherwise specified. Home Health Nurse may visit PRN to address patient s wound care needs. - Monday and Friday [facility] fax [phone] Bathing/ Shower/ Hygiene: May shower; gently cleanse wound with antibacterial soap, rinse and pat dry prior to dressing wounds No tub bath. Anesthetic (Use 'Patient Medications' Section for Anesthetic Order Entry): Lidocaine applied to wound bed Edema Control - Lymphedema / Segmental Compressive Device / Other: Elevate, Exercise Daily and Avoid Standing for Long Periods of Time. Elevate legs to the level of the heart and pump ankles as often as possible Elevate leg(s) parallel to the floor when sitting. Additional Orders / Instructions: Follow Nutritious Diet and Increase Protein Intake WOUND #1: - Back Wound Laterality: Midline, Distal Cleanser: Normal Saline 1 x Per Day/30 Days Discharge Instructions: Wash your hands with soap and water. Remove old dressing, discard into plastic bag and place into trash. Cleanse the wound with Normal Saline prior to applying a clean dressing using gauze sponges, not tissues or cotton balls. Do not scrub or use excessive  force. Pat dry using gauze sponges, not tissue or cotton balls. Primary Dressing: Hydrofera Blue Rope 1 x Per Day/30 Days Discharge Instructions: cut into fourths; angle the end Secondary Dressing: (SILICONE BORDER) Zetuvit Plus SILICONE BORDER Dressing 4x4 (in/in) 1 x Per Day/30 Days 1. I would recommend currently that we go ahead and continue with the wound care measures as before this includes the Hydrofera Blue rope which I do believe is doing well currently. 2. Also can recommend that we have the patient continue with the bordered foam dressing to cover. 3. I did actually go ahead and mark where it feels like the pocket is where when I push on this area initially we will express fluid I think this may be the biggest culprit area. Nonetheless I wanted to mark this so that they could evaluate when she seen in neurosurgery on Monday and also sent a skinny probe with her as well for them to be able to evaluate and check this area if wanted. We will see patient back for reevaluation in 1  week here in the clinic. If anything worsens or changes patient will contact our office for additional recommendations. Electronic Signature(s) Signed: 06/13/2022 4:10:12 PM By: [name] PA-C Entered By: [name] on 06/13/2022 16:10:12 Liggins, [name] ([id number]) -------------------------------------------------------------------------------- SuperBill Details Patient Name: Fluellen, Aryani C. Date of Service: 06/13/2022 Medical Record Number: [id number] Patient Account Number: [id number] Date of Birth/Sex: [birth year] (61 y.o. F) Treating RN: Yevonne Pax Primary Care Provider: [name] Other Clinician: Referring Provider: Card, John Treating Provider/Extender: Rowan Blase in Treatment: 34 Diagnosis Coding ICD-10 Codes Code Description T81.31XA Disruption of external operation (surgical) wound, not elsewhere classified, initial encounter L98.422 Non-pressure chronic ulcer of back  with fat layer exposed G35 Multiple sclerosis I10 Essential (primary) hypertension Facility Procedures CPT4 Code: [id number] Description: 99213 - WOUND CARE VISIT-LEV 3 EST PT Modifier: Quantity: 1 Physician Procedures CPT4 Code: [id number] Description: 99213 - WC PHYS LEVEL 3 - EST PT Modifier: Quantity: 1 CPT4 Code: Description: ICD-10 Diagnosis Description T81.31XA Disruption of external operation (surgical) wound, not elsewhere classifi 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

## 2022-06-14 ENCOUNTER — Ambulatory Visit: Payer: 59 | Admitting: Occupational Therapy

## 2022-06-18 ENCOUNTER — Encounter: Payer: 59 | Admitting: Physician Assistant

## 2022-06-18 ENCOUNTER — Ambulatory Visit: Payer: 59 | Admitting: Occupational Therapy

## 2022-06-18 ENCOUNTER — Other Ambulatory Visit
Admission: RE | Admit: 2022-06-18 | Discharge: 2022-06-18 | Disposition: A | Discharge status: Home / Self Care | Payer: 59 | Source: Ambulatory Visit | Attending: Physician Assistant | Admitting: Physician Assistant

## 2022-06-18 DIAGNOSIS — G35 Multiple sclerosis: Secondary | ICD-10-CM | POA: Insufficient documentation

## 2022-06-18 DIAGNOSIS — Y838 Other surgical procedures as the cause of abnormal reaction of the patient, or of later complication, without mention of misadventure at the time of the procedure: Secondary | ICD-10-CM | POA: Diagnosis not present

## 2022-06-18 DIAGNOSIS — L98422 Non-pressure chronic ulcer of back with fat layer exposed: Secondary | ICD-10-CM | POA: Insufficient documentation

## 2022-06-18 DIAGNOSIS — I890 Lymphedema, not elsewhere classified: Secondary | ICD-10-CM

## 2022-06-18 DIAGNOSIS — T8131XA Disruption of external operation (surgical) wound, not elsewhere classified, initial encounter: Secondary | ICD-10-CM | POA: Insufficient documentation

## 2022-06-18 DIAGNOSIS — I10 Essential (primary) hypertension: Secondary | ICD-10-CM | POA: Diagnosis not present

## 2022-06-18 NOTE — Progress Notes (Addendum)
DHANI, IMEL ([id number]) Visit Report for 06/18/2022 Chief Complaint Document Details Patient Name: [place], Anela C. Date of Service: 06/18/2022 1:15 PM Medical Record Number: [id number] Patient Account Number: [id number] Date of Birth/Sex: 09-11-1961 (61 y.o. F) Treating RN: Yevonne Pax Primary Care Provider: [name] Other Clinician: Referring Provider: Card, John Treating Provider/Extender: Rowan Blase in Treatment: 35 Information Obtained from: Patient Chief Complaint Surgical Back Ulcer Electronic Signature(s) Signed: 06/18/2022 1:16:07 PM By: [name] PA-C Entered By: [name] on 06/18/2022 13:16:07 Duffner, Legaci CMarland Kitchen ([id number]) -------------------------------------------------------------------------------- HPI Details Patient Name: Haseman, Rosario C. Date of Service: 06/18/2022 1:15 PM Medical Record Number: [id number] Patient Account Number: [id number] Date of Birth/Sex: [birth year] (61 y.o. 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 This was actually broken up into 3 separate surgical events. The initial surgical intervention actually was on November 05, 2021 almost a year ago. Subsequently the patient went back in February for a seroma of the area which unfortunately required her to have a repeat surgery to go in and clean this out. And then again this occurred in April where she went back in and again they felt like stitches were coming out and there was an additional seroma. She was placed in a wound VAC initially and then subsequently as it got smaller that  was discontinued. Again right now I will see anything that I think a wound VAC would help with. Nonetheless she definitely has a significant depth to the wound that is going require packing. I actually believe the Hydrofera Blue rope would probably do quite well with this the problem is as much as it is draining she probably needs this to be changed at least every day. She does not really have anyone that can help with that that is the complicating scenario here. With that being said the patient does have a history of multiple sclerosis, hypertension, and again this surgical wound dehiscence in regard to her lumbar spine region. She did have a repeat MRI which was actually completed 10/09/2021. This showed that there was no significant change in the subcutaneous fluid collection/track of the lower lumbar region. This is extending to the level of the fascia unfortunately. This seems to go all the way from the L2 level with a track extending all the way to the fascia at the L4-5 level. Again this is a significant wound and there is significant drainage but does not seem to communicate to the spinal region as far as spinal fluid or otherwise is concerned that is good news. Nonetheless she last saw Dr. Franky Macho who is her neurosurgeon on 10/01/2021 that was when he ordered this last MRI she supposed to see him next week as well. With that being said he did not feel like there was any significant issue there but was not sure why this was not healing that is when he ordered the MRI. They were wanting to make sure that this was packed appropriately by home health unfortunately the main issue currently is that home health is completely out of the picture as the patient has exhausted all the home health that that she gets for a year. She is now in a very difficult  predicament where she does not have anyone to help her change the dressing and to be honest that she is not able to do it herself with the location of  the wound being on the midline lumbar spine region. If she does not have anyone that can help it is probably can to be necessary for her to go to a facility for rehab and daily dressing changes as I feel like daily changes which is much drainage that she is having is going to be necessary. 10/23/2021 upon evaluation today patient appears to be doing decently well in regard to her back ulcer. This does seem to be draining a lot less than what it is been doing in the past. With that being said she still has quite a bit of drainage nonetheless. I do think that given time this should improve least I hope so. The good news is she does have home health coming out 3 days a week were doing it 2 days a week and she is paying someone we can to help. 10/30/2021 upon evaluation today patient appears to be doing okay in regard to her back ulcer this is not draining quite as bad as it was in the beginning but he still has quite a bit of drainage noted. I do believe that the patient would benefit from [place] going ahead forward with attempting a wound VAC using the Hydrofera Blue rope to pack with and then subsequently using the VAC externally to actually suction out and help this to fill- in. I think this is our ideal way to try to get things cleared at this point. As it stands I am not certain that we are really making a progress that we want to see near with doing it in the way we are which is packing with the rope. It is a good dressing but I do think it is insufficient for total healing. She just seems to have too much in the way of drainage at this point unfortunately. 11/06/2021 upon evaluation today patient unfortunately continues to have issues with her back ongoing. The good news is her MRI that was repeated showed signs of the size of this area in the lumbar spine region having decreased from 4 cm to 3.5 cm this is definitely not bad news at all. With that being said unfortunately she continues to have issues  with ongoing drainage not as severe as in the beginning but nonetheless still significant. I do think a wound VAC still would be a good way to go although her home health agency nurse apparently has some concerns about the possibility of not being able to keep a seal with this as they had struggles in the past. Nonetheless I explained to the patient that this is much different than what she had previous and that I really feel like it would do much better as far as getting the area taken care of without having any complications or issues here. I think that we should be able to maintain a seal. Nonetheless at this time I did discuss with the patient as well that she probably does need to have a wound VAC in order for [place] to get this moving in the right direction. 11/13/2021 upon evaluation patient's wound bed actually showed signs of significant drainage at this time. She did see the surgeon yesterday he did not see anything that appeared to be infected. Nonetheless he does appear that she is continuing to have areas here that just do not seem  to want to seal up there MRI findings have been negative but nonetheless she continues to have is the seroma that is filling in. I do feel like we need to try to widen the hole so we can get at least a half of the [facility] then this will be better than nothing at this point. 11/20/2021 upon evaluation today patient actually appears to be having less pain at this point which is good news and overall she we still do not have the results of the culture back yet it had to be sent out to [facility] and we do not have the result back yet. Is doing decently well in regard to her wound. Fortunately there does not appear to be any signs of active infection systemically nor locally at this time. 11/27/2021 upon evaluation today patient appears to be doing well with regard to her wound all things considered there does appear to be less drainage than there was previous.  Fortunately I do not see any evidence of worsening of the patient is stating that she is having some issues with back pain. This is somewhat new. Again this I think could be related to the fact that she is having some issues here with infection. We are still waiting to see what the result of her culture shows from susceptibility testing Enterococcus has been identified but we do not know if this is VRE or not. 12/04/2021 upon evaluation today patient appears to be doing well with regard to her wound all things considered. I did have a conversation with Erin from Dr. Sueanne Margarita office. Of note she notes that Dr. Drinda Butts really does not want to do anything surgical right now which I completely understand. With that being said I am still leery of how far we will make it getting this to heal short of any type of surgery to open this up and allow [place] to more appropriately packed the wound. Nonetheless I will absolutely give it our best shot as far as that is concerned. I discussed that with the patient today. She voiced understanding. The good news is the drainage today seems to be clear it is no longer cloudy as it was previous I am actually very pleased in that regard. SHAQUANTA, HARKLESS ([id number]) 12/11/2021 since have last seen the patient actually did have an conversation with Dr. Franky Macho with her neurosurgeon. Again this involve the discussion around whether or not to open the wound and try to apply a wound VAC following. With that being said the decision was made that that may be the best thing to do if the patient was in agreement. Nonetheless I am actually extremely encouraged with what I am seeing today much more than I would have thought. In fact I think that we may be over packing the wound which is why she is having some discomfort at this point as there is much less of the dressing able to get and this time compared to what we saw previous. That is actually really good news. In fact the 6:00  tunnel appears to have filled in is awesome news. At the 12:00 location I am actually able to pack into that area pretty effectively at this point today. In fact I was able to get less of the packing in which I will detail below. 12/18/2021 upon evaluation today patient appears to be doing well with regard to her wound on the back. Fortunately there is no signs of active infection which is great news and overall  very pleased with where things stand today. No fevers, chills, nausea, vomiting, or diarrhea. 01/01/2022 upon evaluation today patient appears to be doing decently well in regard to her wound. Fortunately there does not appear to be any signs of anything worsening which is great news. She still continues to have issues with drainage but this is not nearly as significant as what it was in the past. There is all clear drainage no signs of purulence noted. 01/08/2022 upon evaluation today patient appears to be doing well with regard to her wound. With that being said she tells me that she has been having some increased pain over the past week. It does appear based on the amount of Hydrofera Blue that we removed this is probably over pack. Again it is difficult to know exactly how old the nurses getting all the skin but nonetheless the length of Hydrofera Blue that was utilized was way too much which may be part of the reason why this felt so uncomfortable. Fortunately I think that we can cut back on that we discussed pieces smaller so hopefully that will not be over packed. 01/22/2021 upon evaluation today patient appears to be doing well With regard to there being no signs of infection at this time. The wound does still tunnel mainly up at the 12:00 location. The depth of the tunnel complete from entry point to the base is about 3.5 cm today. With that being said I do believe that overall she is making good progress this is just a very slow process for her which I know has been frustrating as  well. 01/29/2022 upon evaluation today patient appears to be doing well with regard to the wound on her lower back and the lumbar spine region. The good news is the depth that I got this week was a little bit less going up at the 12:00 location as compared to last week. I measured this to be 3.5 cm last week and it was 3.3 cm this week. Again that is a minute shift but nonetheless a good shift in the right direction. Overall I think that we are on the right track. 02/05/2022 upon evaluation today patient appears to be doing well with regard to her wound. In fact right now her measurements are somewhere around 2.9 cm which is definitely smaller and less deep than last week At the 12:00 location. Overall I am very pleased with what we are seeing. 02/19/2022 upon evaluation today patient unfortunately is noting that the wound is actually closing up externally but internally there is still some depth to this. I discussed with her today that we cannot let it close up like this is can end up being a significant issue for her if we allow for that. Subsequently we need to do what we can to try to open this up and keep it open more effectively. She voiced understanding. With that being said we are going to proceed with that procedure today in order to debride away some of the skin on externally that is trying to close and on this. 02/26/2022 upon evaluation patient appears to be doing better in my opinion after we had to open up this area last week. Again she has a significant amount of healing and overall I am extremely pleased with where things stand currently. I do not see any evidence of active infection locally nor systemically at this time which is great news and in general I think that we are on the right track for getting  this hopefully closed final and done. 03/05/2022 upon evaluation today patient unfortunately is doing a little bit worse with regard to the whole size this is starting to get much  smaller unfortunately. There does not appear to be any signs of active infection locally nor systemically which is great news. With that being said I am concerned about the fact that the patient is doing quite a bit worse with regard to trapping some fluid and almost feels like she may have a fluid pocket that not only goes in and up towards 12:00 but looks back around towards the more superficial subcutaneous tissue. I am very concerned that this is going to be something that is very hard for [place] to heal short of another surgery to open this up and try to wound VAC from the inside out. Even then there is no guarantees this is just a very difficult situation to be perfectly honest. 03/14/2022 upon evaluation today patient appears to be doing well with regard to her wound as far as the area staying open and pleased in that regard. With that being said unfortunately she is not doing as well when it comes to the irritation around it appears to be very inflamed and red this is not good that was discussed with her today as well. Subsequently I do believe that working to need to do what we can do to try and calm this down in the past she did well with the Augmentin due to Enterococcus infection I am not sure if were dealing with the same thing or not but at the same time I think that is probably to be my go to at this point. 03/19/2022 upon evaluation today patient appears to be doing really about the same in regard to her wound that the pain is better. 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 She seems to be improved dramatically with the antibiotic currently although it does not cover for MRSA I am apt to just see how this progresses over the next several days to a week. With that being said the bigger issue here is that we simply do not seem to be making excellent progress and I am concerned about the lack of progress. I discussed with Dr. Shon Baton with [facility] in [place]  and to be honest his recommendation was that the best bet would probably be to get her to a teaching center. She is actually been seen for rheumatology and a I believe psychologist/psychiatrist at [facility]. She would prefer to go that direction as opposed to [facility] or [facility]. I am definitely okay with that. 03/26/2022 upon evaluation today patient appears to be doing okay in regard to her wound. She is not showing signs of worsening and overall she tells me that her pain is not nearly as bad as it was previous. Fortunately I do not see any evidence of active infection locally or systemically which is great news. No fevers, chills, nausea, vomiting, or diarrhea. 04-02-2022 upon evaluation today patient appears to be doing well with regard to her wound. It does not appear to be showing any signs of worsening which is great news. With that being said you are still having some issues here with drainage although it is clear and mainly just tenderness with bleeding from the [facility] I do think that treating for the second issue which is MRSA is probably the right thing to do at this point she is now off of the Augmentin. 04-09-2022 upon  evaluation today patient's wound actually appears to be doing about the same. Fortunately I do not see any evidence of active infection locally or systemically at this time which is great news. No fevers, chills, nausea, vomiting, or diarrhea. 04-16-2022 upon evaluation today patient appears to be doing well with regard to her wound in the lumbar spine region. Subsequently she continues to have an area that does have a tunnel up into the 12:00 location. This is about 3.5 cm using a skinny probe curved upwards to get into this region. With that being said I really do not see any signs of overall improvement but also do not see any signs of worsening I feel like work on about a still made here to some degree. The patient did see her neurologist and he has referred her to  neurosurgery at [facility] for second ULYANA, HARDBARGER. ([id number]) opinion we will see what they have to say as well. 04-23-2022 upon evaluation today patient appears to be doing well with regard to her wound all things considered. She has been tolerating the dressing changes without complication. Fortunately I do not see any signs of active infection locally nor systemically which is great news. No fever chills noted 04-30-2022 upon evaluation today patient appears to be doing about the same in regard to her wound. The depth is really not dramatically improved as far as the 12:00 tunnel is concerned. The overall depth and the base of the wound does seem to be a little bit better it does sound like that external wound has closed as far as the opening is concerned we can have to cut this down from a half to a smaller size based on what we are seeing today. 05-07-2022 upon evaluation today patient's wound actually seems to be doing decently well. There is not a major shift here but a slight shift in the depth both straight and as well as in the [place] at 12:00. With that being said I again we will talk about a couple of millimeters but still every little bit can count when there are situations like this to be honest. 05-14-2022 upon evaluation today patient appears to be doing okay in regard to her wound. I do not see any signs of anything worsening. With that being said also do not see getting significantly better unfortunately. There does not appear to be any evidence of infection locally or systemically which is great news. No fevers, chills, nausea, vomiting, or diarrhea. 05-21-2022 upon evaluation today patient actually appears to be doing quite well in regard to her wound from the standpoint of there being no infection. With that being said there does not appear to be any evidence of infection locally nor systemically which is great news. No fevers, chills, nausea, vomiting, or diarrhea. 05-28-2022  upon evaluation today patient's wound actually appears to be doing about the same. I do not see any evidence of active infection locally or systemically which is great news. No fevers, chills, nausea, vomiting, or diarrhea. 06-04-2022 upon evaluation today patient appears to be doing well with regard to her wound. She has been tolerating dressing changes without complication. Fortunately I do not feel like there is any worsening also I do not feel like there is any improvement. I feel like right a solid area here where she has a tunnel that tracks up at 12:00 and then has a fluid pocket at around roughly 1:00 I can push on this and squeeze fluid out. Again I am not exactly sure  what else can be done from my standpoint to improve this. She does have an appointment with Dr. Franky Macho who I do believe is an excellent surgeon and this is the surgeon who performed this surgery initially. Again the biggest issue was following she had a lot of trouble with the wound VAC I do think that we need to try to see if she is can have a wound VAC following surgery what exactly regular need to do to help keep this moving in the right direction for her. Obviously I want her to have the best result possible that she has to go through surgery again to clean this area out. Absolutely willing to help out with getting this to heal. 06-13-2022 upon evaluation today patient appears to be doing well with regard to her wound its not any worse unfortunately it is also not significantly better which is the only issue currently. I do not see any signs of active infection locally or systemically which is great news. No fevers, chills, nausea, vomiting, or diarrhea. She does have her appointment on Monday with her neurosurgeon and we will see you Dr. Franky Macho has to say at that point. 06-18-2022 upon evaluation patient appears to be doing a little worse in regard to her wound only in the respect that has been several days since she has had  this changed in fact it was last changed on Friday. Since that time she tells me that she has kept this in place over the weekend and yesterday she was supposed to see her neurosurgeon yesterday but they had to cancel due to an emergency surgery according to what the patient tells me. Nonetheless she is having some issues here with drainage coming from the wound that is more purulent in nature I think this is something we have been seeing in the [facility] for several weeks but is more obvious being that it was not changed as much during the last week. She unfortunately is a little worried about this but again I think that this is just part of what we have been dealing with all long is more prevalent and obvious due to the length of time since it was changed last. She does have a repeat appointment for I believe July 10 she told me although she is going to see if she can find anything sooner. Electronic Signature(s) Signed: 06/18/2022 2:16:19 PM By: [name] PA-C Entered By: [name] on 06/18/2022 14:16:19 [place], Domingo Pulse ([id number]) -------------------------------------------------------------------------------- Physical Exam Details Patient Name: Mcglothlin, Arihana C. Date of Service: 06/18/2022 1:15 PM Medical Record Number: [id number] Patient Account Number: [id number] Date of Birth/Sex: 18-Aug-1961 (61 y.o. F) Treating RN: Yevonne Pax Primary Care Provider: [name] Other Clinician: Referring Provider: Card, John Treating Provider/Extender: Rowan Blase in Treatment: 35 Constitutional Obese and well-hydrated in no acute distress. Respiratory normal breathing without difficulty. Psychiatric this patient is able to make decisions and demonstrates good insight into disease process. Alert and Oriented x 3. pleasant and cooperative. Notes Upon evaluation patient still has a significant area that goes up to the 12:00 and roughly more to the wound 2:00 location  curved upward where she does have a pocket this is where all of the purulent drainage is coming from. After cleaning this out as best I could I then took a sample of the deeper part of the wound in order to evaluate and see what bacteria may be present as I am wondering if she does need some additional antibiotics  at this point until she can get into see Dr. Franky Macho. She was appreciative of this and we will see what this shows. Electronic Signature(s) Signed: 06/18/2022 2:17:10 PM By: [name] PA-C Entered By: [name] on 06/18/2022 14:17:10 [place], Domingo Pulse ([id number]) -------------------------------------------------------------------------------- Physician Orders Details Patient Name: Mccluskey, Honesty C. Date of Service: 06/18/2022 1:15 PM Medical Record Number: [id number] Patient Account Number: [id number] Date of Birth/Sex: 1961/03/13 (61 y.o. 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 o Nurse Visit as needed Home Health o Home Health Company: [facility] o [facility] for wound care. May utilize formulary equivalent dressing for wound treatment orders unless otherwise specified. Home Health Nurse may visit PRN to address patientos wound care needs. - Monday and Friday [facility] fax [phone] Bathing/ Shower/ Hygiene o May shower; gently cleanse wound with antibacterial soap, rinse and pat dry prior to dressing wounds o No tub bath. Anesthetic (Use 'Patient  Medications' Section for Anesthetic Order Entry) o Lidocaine applied to wound bed Edema Control - Lymphedema / Segmental Compressive Device / Other o Elevate, Exercise Daily and Avoid Standing for Long Periods of Time. o Elevate legs to the level of the heart and pump ankles as often as possible o Elevate leg(s) parallel to the floor when sitting. Additional Orders / Instructions o Follow Nutritious Diet and Increase Protein Intake Wound Treatment Wound #1 - Back Wound Laterality: Midline, Distal Cleanser: Normal Saline 1 x Per Day/30 Days Discharge Instructions: Wash your hands with soap and water. Remove old dressing, discard into plastic bag and place into trash. Cleanse the wound with Normal Saline prior to applying a clean dressing using gauze sponges, not tissues or cotton balls. Do not scrub or use excessive force. Pat dry using gauze sponges, not tissue or cotton balls. Primary Dressing: Hydrofera Blue Rope 1 x Per Day/30 Days Discharge Instructions: cut into fourths; angle the end Secondary Dressing: (SILICONE BORDER) Zetuvit Plus SILICONE BORDER Dressing 4x4 (in/in) 1 x Per Day/30 Days Laboratory o Bacteria identified in Wound by Culture (MICRO) - non healing wound back - (ICD10 T81.31XA - Disruption of external operation (surgical) wound, not elsewhere classified, initial encounter) oooo LOINC Code: 6462-6 oooo Convenience Name: Wound culture routine KAYSEE, HERGERT ([id number]) Electronic Signature(s) Signed: 07/12/2022 4:36:28 PM By: [name] PA-C Signed: 09/16/2022 2:33:12 PM By: Yevonne Pax RN Previous Signature: 06/19/2022 8:55:04 AM Version By: [name] PA-C Previous Signature: 06/19/2022 2:40:31 PM Version By: Yevonne Pax RN Entered By: Yevonne Pax on 07/03/2022 15:19:44 Robillard, Pallie C. ([id number]) -------------------------------------------------------------------------------- Problem List Details Patient Name: Deery, Lyrique  C. Date of Service: 06/18/2022 1:15 PM Medical Record Number: [id number] Patient Account Number: [id number] Date of Birth/Sex: 11-24-1961 (61 y.o. 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 ([id number]) -------------------------------------------------------------------------------- Progress Note Details Patient Name: Kratz, Rosaisela C. Date of Service: 06/18/2022 1:15 PM Medical Record Number: [id number] Patient Account Number: [id number] Date of Birth/Sex: 08-12-1961 (61 y.o. F) Treating RN: Yevonne Pax Primary Care Provider: [name] Other Clinician: Referring Provider: Card, John Treating Provider/Extender: Rowan Blase in Treatment: 35 Subjective Chief Complaint Information obtained from Patient Surgical Back Ulcer History of Present Illness (HPI) 10/15/2021 upon evaluation today patient presents for initial evaluation here in the clinic concerning a surgical ulceration/dehiscence in the lumbar spine region following surgery that she had over the past year. This was actually broken up into 3 separate surgical events. The initial surgical intervention actually was on November 05, 2021 almost a year ago. Subsequently the patient went back in February for a seroma of the area which  unfortunately required her to have a repeat surgery to go in and clean this out. And then again this occurred in April where she went back in and again they felt like stitches were coming out and there was an additional seroma. She was placed in a wound VAC initially and then subsequently as it got smaller that was discontinued. Again right now I will see anything that I think a wound VAC would help with. Nonetheless she definitely has a significant depth to the wound that is going require packing. I actually believe the Hydrofera Blue rope would probably do quite well with this the problem is as much as it is draining she probably needs this to be changed at least every day. She does not really have anyone that can help with that that is the complicating scenario here. With that being said the patient does have a history of multiple sclerosis, hypertension, and again this surgical wound dehiscence in regard to her lumbar spine region. She did have a repeat MRI which was actually completed 10/09/2021. This showed that there was no significant change in the subcutaneous fluid collection/track of the lower lumbar region. This is extending to the level of the fascia unfortunately. This seems to go all the way from the L2 level with a track extending all the way to the fascia at the L4-5 level. Again this is a significant wound and there is significant drainage but does not seem to communicate to the spinal region as far as spinal fluid or otherwise is concerned that is good news. Nonetheless she last saw Dr. Franky Macho who is her neurosurgeon on 10/01/2021 that was when he ordered this last MRI she supposed to see him next week as well. With that being said he did not feel like there was any significant issue there but was not sure why this was not healing that is when he ordered the MRI. They were wanting to make sure that this was packed appropriately by home health unfortunately the main issue currently is  that home health is completely out of the picture as the patient has exhausted all the home health that that she gets for a year. She is now in a very difficult predicament where she does not have anyone to help her change the dressing and to be honest that she is not able to do it herself with the location of the wound being on the midline lumbar spine region. If she does not have anyone that can help it is  probably can to be necessary for her to go to a facility for rehab and daily dressing changes as I feel like daily changes which is much drainage that she is having is going to be necessary. 10/23/2021 upon evaluation today patient appears to be doing decently well in regard to her back ulcer. This does seem to be draining a lot less than what it is been doing in the past. With that being said she still has quite a bit of drainage nonetheless. I do think that given time this should improve least I hope so. The good news is she does have home health coming out 3 days a week were doing it 2 days a week and she is paying someone we can to help. 10/30/2021 upon evaluation today patient appears to be doing okay in regard to her back ulcer this is not draining quite as bad as it was in the beginning but he still has quite a bit of drainage noted. I do believe that the patient would benefit from [place] going ahead forward with attempting a wound VAC using the Hydrofera Blue rope to pack with and then subsequently using the VAC externally to actually suction out and help this to fill- in. I think this is our ideal way to try to get things cleared at this point. As it stands I am not certain that we are really making a progress that we want to see near with doing it in the way we are which is packing with the rope. It is a good dressing but I do think it is insufficient for total healing. She just seems to have too much in the way of drainage at this point unfortunately. 11/06/2021 upon evaluation today patient  unfortunately continues to have issues with her back ongoing. The good news is her MRI that was repeated showed signs of the size of this area in the lumbar spine region having decreased from 4 cm to 3.5 cm this is definitely not bad news at all. With that being said unfortunately she continues to have issues with ongoing drainage not as severe as in the beginning but nonetheless still significant. I do think a wound VAC still would be a good way to go although her home health agency nurse apparently has some concerns about the possibility of not being able to keep a seal with this as they had struggles in the past. Nonetheless I explained to the patient that this is much different than what she had previous and that I really feel like it would do much better as far as getting the area taken care of without having any complications or issues here. I think that we should be able to maintain a seal. Nonetheless at this time I did discuss with the patient as well that she probably does need to have a wound VAC in order for [place] to get this moving in the right direction. 11/13/2021 upon evaluation patient's wound bed actually showed signs of significant drainage at this time. She did see the surgeon yesterday he did not see anything that appeared to be infected. Nonetheless he does appear that she is continuing to have areas here that just do not seem to want to seal up there MRI findings have been negative but nonetheless she continues to have is the seroma that is filling in. I do feel like we need to try to widen the hole so we can get at least a half of the [facility] then this will  be better than nothing at this point. 11/20/2021 upon evaluation today patient actually appears to be having less pain at this point which is good news and overall she we still do not have the results of the culture back yet it had to be sent out to [facility] and we do not have the result back yet. Is doing decently well in  regard to her wound. Fortunately there does not appear to be any signs of active infection systemically nor locally at this time. 11/27/2021 upon evaluation today patient appears to be doing well with regard to her wound all things considered there does appear to be less drainage than there was previous. Fortunately I do not see any evidence of worsening of the patient is stating that she is having some issues with back pain. This is somewhat new. Again this I think could be related to the fact that she is having some issues here with infection. We are still waiting to see what the result of her culture shows from susceptibility testing Enterococcus has been identified but we do not know if this is VRE or not. 12/04/2021 upon evaluation today patient appears to be doing well with regard to her wound all things considered. I did have a conversation with Erin from Dr. Sueanne Margarita office. Of note she notes that Dr. Drinda Butts really does not want to do anything surgical right now which I completely Medeiros, Azyah C. ([id number]) understand. With that being said I am still leery of how far we will make it getting this to heal short of any type of surgery to open this up and allow [place] to more appropriately packed the wound. Nonetheless I will absolutely give it our best shot as far as that is concerned. I discussed that with the patient today. She voiced understanding. The good news is the drainage today seems to be clear it is no longer cloudy as it was previous I am actually very pleased in that regard. 12/11/2021 since have last seen the patient actually did have an conversation with Dr. Franky Macho with her neurosurgeon. Again this involve the discussion around whether or not to open the wound and try to apply a wound VAC following. With that being said the decision was made that that may be the best thing to do if the patient was in agreement. Nonetheless I am actually extremely encouraged with what I am  seeing today much more than I would have thought. In fact I think that we may be over packing the wound which is why she is having some discomfort at this point as there is much less of the dressing able to get and this time compared to what we saw previous. That is actually really good news. In fact the 6:00 tunnel appears to have filled in is awesome news. At the 12:00 location I am actually able to pack into that area pretty effectively at this point today. In fact I was able to get less of the packing in which I will detail below. 12/18/2021 upon evaluation today patient appears to be doing well with regard to her wound on the back. Fortunately there is no signs of active infection which is great news and overall very pleased with where things stand today. No fevers, chills, nausea, vomiting, or diarrhea. 01/01/2022 upon evaluation today patient appears to be doing decently well in regard to her wound. Fortunately there does not appear to be any signs of anything worsening which is great news. She still continues to have  issues with drainage but this is not nearly as significant as what it was in the past. There is all clear drainage no signs of purulence noted. 01/08/2022 upon evaluation today patient appears to be doing well with regard to her wound. With that being said she tells me that she has been having some increased pain over the past week. It does appear based on the amount of Hydrofera Blue that we removed this is probably over pack. Again it is difficult to know exactly how old the nurses getting all the skin but nonetheless the length of Hydrofera Blue that was utilized was way too much which may be part of the reason why this felt so uncomfortable. Fortunately I think that we can cut back on that we discussed pieces smaller so hopefully that will not be over packed. 01/22/2021 upon evaluation today patient appears to be doing well With regard to there being no signs of infection at this  time. The wound does still tunnel mainly up at the 12:00 location. The depth of the tunnel complete from entry point to the base is about 3.5 cm today. With that being said I do believe that overall she is making good progress this is just a very slow process for her which I know has been frustrating as well. 01/29/2022 upon evaluation today patient appears to be doing well with regard to the wound on her lower back and the lumbar spine region. The good news is the depth that I got this week was a little bit less going up at the 12:00 location as compared to last week. I measured this to be 3.5 cm last week and it was 3.3 cm this week. Again that is a minute shift but nonetheless a good shift in the right direction. Overall I think that we are on the right track. 02/05/2022 upon evaluation today patient appears to be doing well with regard to her wound. In fact right now her measurements are somewhere around 2.9 cm which is definitely smaller and less deep than last week At the 12:00 location. Overall I am very pleased with what we are seeing. 02/19/2022 upon evaluation today patient unfortunately is noting that the wound is actually closing up externally but internally there is still some depth to this. I discussed with her today that we cannot let it close up like this is can end up being a significant issue for her if we allow for that. Subsequently we need to do what we can to try to open this up and keep it open more effectively. She voiced understanding. With that being said we are going to proceed with that procedure today in order to debride away some of the skin on externally that is trying to close and on this. 02/26/2022 upon evaluation patient appears to be doing better in my opinion after we had to open up this area last week. Again she has a significant amount of healing and overall I am extremely pleased with where things stand currently. I do not see any evidence of active infection locally  nor systemically at this time which is great news and in general I think that we are on the right track for getting this hopefully closed final and done. 03/05/2022 upon evaluation today patient unfortunately is doing a little bit worse with regard to the whole size this is starting to get much smaller unfortunately. There does not appear to be any signs of active infection locally nor systemically which is great news.  With that being said I am concerned about the fact that the patient is doing quite a bit worse with regard to trapping some fluid and almost feels like she may have a fluid pocket that not only goes in and up towards 12:00 but looks back around towards the more superficial subcutaneous tissue. I am very concerned that this is going to be something that is very hard for [place] to heal short of another surgery to open this up and try to wound VAC from the inside out. Even then there is no guarantees this is just a very difficult situation to be perfectly honest. 03/14/2022 upon evaluation today patient appears to be doing well with regard to her wound as far as the area staying open and pleased in that regard. With that being said unfortunately she is not doing as well when it comes to the irritation around it appears to be very inflamed and red this is not good that was discussed with her today as well. Subsequently I do believe that working to need to do what we can do to try and calm this down in the past she did well with the Augmentin due to Enterococcus infection I am not sure if were dealing with the same thing or not but at the same time I think that is probably to be my go to at this point. 03/19/2022 upon evaluation today patient appears to be doing really about the same in regard to her wound that the pain is better. 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 She seems to be improved dramatically with the antibiotic currently although it does not cover for  MRSA I am apt to just see how this progresses over the next several days to a week. With that being said the bigger issue here is that we simply do not seem to be making excellent progress and I am concerned about the lack of progress. I discussed with Dr. Shon Baton with [facility] in [place] and to be honest his recommendation was that the best bet would probably be to get her to a teaching center. She is actually been seen for rheumatology and a I believe psychologist/psychiatrist at [facility]. She would prefer to go that direction as opposed to [facility] or [facility]. I am definitely okay with that. 03/26/2022 upon evaluation today patient appears to be doing okay in regard to her wound. She is not showing signs of worsening and overall she tells me that her pain is not nearly as bad as it was previous. Fortunately I do not see any evidence of active infection locally or systemically which is great news. No fevers, chills, nausea, vomiting, or diarrhea. 04-02-2022 upon evaluation today patient appears to be doing well with regard to her wound. It does not appear to be showing any signs of worsening which is great news. With that being said you are still having some issues here with drainage although it is clear and mainly just tenderness with bleeding from the [facility] I do think that treating for the second issue which is MRSA is probably the right thing to do at this point she is now off of the Augmentin. 04-09-2022 upon evaluation today patient's wound actually appears to be doing about the same. Fortunately I do not see any evidence of active infection locally or systemically at this time which is great news. No fevers, chills, nausea, vomiting, or diarrhea. ELEKTRA, WARTMAN ([id number]) 04-16-2022 upon evaluation today patient appears to be  doing well with regard to her wound in the lumbar spine region. Subsequently she continues to have an area that does have a tunnel up into the 12:00 location. This  is about 3.5 cm using a skinny probe curved upwards to get into this region. With that being said I really do not see any signs of overall improvement but also do not see any signs of worsening I feel like work on about a still made here to some degree. The patient did see her neurologist and he has referred her to neurosurgery at [facility] for second opinion we will see what they have to say as well. 04-23-2022 upon evaluation today patient appears to be doing well with regard to her wound all things considered. She has been tolerating the dressing changes without complication. Fortunately I do not see any signs of active infection locally nor systemically which is great news. No fever chills noted 04-30-2022 upon evaluation today patient appears to be doing about the same in regard to her wound. The depth is really not dramatically improved as far as the 12:00 tunnel is concerned. The overall depth and the base of the wound does seem to be a little bit better it does sound like that external wound has closed as far as the opening is concerned we can have to cut this down from a half to a smaller size based on what we are seeing today. 05-07-2022 upon evaluation today patient's wound actually seems to be doing decently well. There is not a major shift here but a slight shift in the depth both straight and as well as in the [place] at 12:00. With that being said I again we will talk about a couple of millimeters but still every little bit can count when there are situations like this to be honest. 05-14-2022 upon evaluation today patient appears to be doing okay in regard to her wound. I do not see any signs of anything worsening. With that being said also do not see getting significantly better unfortunately. There does not appear to be any evidence of infection locally or systemically which is great news. No fevers, chills, nausea, vomiting, or diarrhea. 05-21-2022 upon evaluation today patient actually  appears to be doing quite well in regard to her wound from the standpoint of there being no infection. With that being said there does not appear to be any evidence of infection locally nor systemically which is great news. No fevers, chills, nausea, vomiting, or diarrhea. 05-28-2022 upon evaluation today patient's wound actually appears to be doing about the same. I do not see any evidence of active infection locally or systemically which is great news. No fevers, chills, nausea, vomiting, or diarrhea. 06-04-2022 upon evaluation today patient appears to be doing well with regard to her wound. She has been tolerating dressing changes without complication. Fortunately I do not feel like there is any worsening also I do not feel like there is any improvement. I feel like right a solid area here where she has a tunnel that tracks up at 12:00 and then has a fluid pocket at around roughly 1:00 I can push on this and squeeze fluid out. Again I am not exactly sure what else can be done from my standpoint to improve this. She does have an appointment with Dr. Franky Macho who I do believe is an excellent surgeon and this is the surgeon who performed this surgery initially. Again the biggest issue was following she had a lot of trouble with the  wound VAC I do think that we need to try to see if she is can have a wound VAC following surgery what exactly regular need to do to help keep this moving in the right direction for her. Obviously I want her to have the best result possible that she has to go through surgery again to clean this area out. Absolutely willing to help out with getting this to heal. 06-13-2022 upon evaluation today patient appears to be doing well with regard to her wound its not any worse unfortunately it is also not significantly better which is the only issue currently. I do not see any signs of active infection locally or systemically which is great news. No fevers, chills, nausea, vomiting, or  diarrhea. She does have her appointment on Monday with her neurosurgeon and we will see you Dr. Franky Macho has to say at that point. 06-18-2022 upon evaluation patient appears to be doing a little worse in regard to her wound only in the respect that has been several days since she has had this changed in fact it was last changed on Friday. Since that time she tells me that she has kept this in place over the weekend and yesterday she was supposed to see her neurosurgeon yesterday but they had to cancel due to an emergency surgery according to what the patient tells me. Nonetheless she is having some issues here with drainage coming from the wound that is more purulent in nature I think this is something we have been seeing in the [facility] for several weeks but is more obvious being that it was not changed as much during the last week. She unfortunately is a little worried about this but again I think that this is just part of what we have been dealing with all long is more prevalent and obvious due to the length of time since it was changed last. She does have a repeat appointment for I believe July 10 she told me although she is going to see if she can find anything sooner. Objective Constitutional Obese and well-hydrated in no acute distress. Vitals Time Taken: 1:25 PM, Height: 58 in, Weight: 275 lbs, BMI: 57.5, Temperature: 98.1 F, Pulse: 81 bpm, Respiratory Rate: 18 breaths/min, Blood Pressure: 119/86 mmHg. Respiratory normal breathing without difficulty. Psychiatric this patient is able to make decisions and demonstrates good insight into disease process. Alert and Oriented x 3. pleasant and cooperative. General Notes: Upon evaluation patient still has a significant area that goes up to the 12:00 and roughly more to the wound 2:00 location curved Loree, Kimberleigh C. ([id number]) upward where she does have a pocket this is where all of the purulent drainage is coming from. After cleaning  this out as best I could I then took a sample of the deeper part of the wound in order to evaluate and see what bacteria may be present as I am wondering if she does need some additional antibiotics at this point until she can get into see Dr. Franky Macho. She was appreciative of this and we will see what this shows. Integumentary (Hair, Skin) Wound #1 status is Open. Original cause of wound was Surgical Injury. The date acquired was: 04/11/2021. The wound has been in treatment 35 weeks. The wound is located on the Distal,Midline Back. The wound measures 0.4cm length x 0.3cm width x 2.7cm depth; 0.094cm^2 area and 0.254cm^3 volume. There is Fat Layer (Subcutaneous Tissue) exposed. There is no tunneling or undermining noted. There is a medium amount  of purulent drainage noted. There is large (67-100%) red granulation within the wound bed. There is no necrotic tissue within the wound bed. 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 Nurse Visit as needed Home Health: Home Health Company: - [facility] for wound care. May utilize formulary equivalent dressing for wound treatment orders unless otherwise specified. Home Health Nurse may visit PRN to address patient s wound care needs. - Monday and Friday [facility] fax [phone] Bathing/ Shower/ Hygiene: May shower; gently cleanse wound with antibacterial soap, rinse and pat dry prior to dressing wounds No tub bath. Anesthetic (Use 'Patient Medications' Section for Anesthetic Order Entry): Lidocaine applied to wound bed Edema Control - Lymphedema / Segmental Compressive Device / Other: Elevate, Exercise Daily and Avoid Standing for Long Periods of Time. Elevate legs to the level of  the heart and pump ankles as often as possible Elevate leg(s) parallel to the floor when sitting. Additional Orders / Instructions: Follow Nutritious Diet and Increase Protein Intake WOUND #1: - Back Wound Laterality: Midline, Distal Cleanser: Normal Saline 1 x Per Day/30 Days Discharge Instructions: Wash your hands with soap and water. Remove old dressing, discard into plastic bag and place into trash. Cleanse the wound with Normal Saline prior to applying a clean dressing using gauze sponges, not tissues or cotton balls. Do not scrub or use excessive force. Pat dry using gauze sponges, not tissue or cotton balls. Primary Dressing: Hydrofera Blue Rope 1 x Per Day/30 Days Discharge Instructions: cut into fourths; angle the end Secondary Dressing: (SILICONE BORDER) Zetuvit Plus SILICONE BORDER Dressing 4x4 (in/in) 1 x Per Day/30 Days 1. I would recommend currently that we go ahead and continue with the wound care measures as before and the patient is in agreement with plan. This includes the use of the Hydrofera Blue rope which I do believe is doing well. 2. I am also can recommend that she continue to change this on the normal schedule that we have been doing through the week. I think that is keeping things under good control obviously going from Friday to Tuesday was a little bit too long for her and again we do not want to repeat that going forward in my opinion. 3. I am also can recommend that we see what the results of the culture show and will make any adjustments in therapy going forward as necessary. We will see patient back for reevaluation in 1 week here in the clinic. If anything worsens or changes patient will contact our office for additional recommendations. Electronic Signature(s) Signed: 06/18/2022 2:19:03 PM By: [name] PA-C Previous Signature: 06/18/2022 2:18:19 PM Version By: Gloris Manchester, Margery C. ([id number]) Entered By: [name] on  06/18/2022 14:19:02 Ensey, Alizon Salena Saner ([id number]) -------------------------------------------------------------------------------- SuperBill Details Patient Name: Quirk, Aolani C. Date of Service: 06/18/2022 Medical Record Number: [id number] Patient Account Number: [id number] Date of Birth/Sex: 1961/04/30 (61 y.o. 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 G35 Multiple sclerosis I10 Essential (primary) hypertension Facility Procedures CPT4 Code: [id number] Description: [id number] - WOUND CARE VISIT-LEV  2 EST PT Modifier: Quantity: 1 Physician Procedures CPT4 Code: [id number] Description: 99214 - WC PHYS LEVEL 4 - EST PT Modifier: Quantity: 1 CPT4 Code: Description: ICD-10 Diagnosis Description T81.31XA Disruption of external operation (surgical) wound, not elsewhere classifi 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

## 2022-06-18 NOTE — Therapy (Deleted)
OUTPATIENT OCCUPATIONAL THERAPY NEURO EVALUATION  Patient Name: [name] MRN: [id number] DOB:12/18/1961, 61 y.o., female Today's Date: 06/18/2022  PCP: Loman Brooklyn, MD  REFERRING PROVIDER: Corena Pilgrim, FNP   OT End of Session - 06/18/22 1115     Visit Number 25    Number of Visits 36    Date for OT Re-Evaluation 08/12/22    OT Start Time 1105    OT Stop Time 1205    OT Time Calculation (min) 60 min    Activity Tolerance Patient tolerated treatment well;No increased pain    Behavior During Therapy WFL for tasks assessed/performed             Past Medical History:  Diagnosis Date   Arthritis    Back pain    Chronic knee pain    Edema of both lower extremities    High blood pressure    High cholesterol    Joint pain    Multiple sclerosis (HCC)    Neuromuscular disorder (HCC)    Multiple Sclerosis over 20 years   Obesity    Vitamin D deficiency    Past Surgical History:  Procedure Laterality Date   APPENDECTOMY     CESAREAN SECTION     HAND SURGERY     lumbar back surgery     LUMBAR WOUND DEBRIDEMENT N/A 12/05/2020   Procedure: Lumbar Wound Exploration;  Surgeon: Coletta Memos, MD;  Location[facility] OR;  Service: Neurosurgery;  Laterality: N/A;  3C/RM 21   LUMBAR WOUND DEBRIDEMENT N/A 02/21/2021   Procedure: Lumbar wound exploration;  Surgeon: Coletta Memos, MD;  Location: [facility] OR;  Service: Neurosurgery;  Laterality: N/A;  posterior   LUMBAR WOUND DEBRIDEMENT N/A 04/11/2021   Procedure: Wound Exploration with Wound Vac Placement;  Surgeon: Coletta Memos, MD;  Location: [facility] OR;  Service: Neurosurgery;  Laterality: N/A;   ROTATOR CUFF REPAIR     TONSILLECTOMY     Patient Active Problem List   Diagnosis Date Noted   Postoperative seroma of subcutaneous tissue after non-dermatologic procedure 04/11/2021   Wound drainage 02/21/2021   Wound dehiscence 02/21/2021   Postoperative seroma of musculoskeletal structure after musculoskeletal procedure 12/05/2020    Spondylolisthesis of lumbar region 11/02/2020   Insulin resistance 02/15/2020   Class 3 severe obesity with serious comorbidity and body mass index (BMI) of 45.0 to 49.9 in adult (HCC) 01/19/2020   Vitamin D deficiency 01/18/2020   Other hyperlipidemia 02/22/2019   Primary osteoarthritis of both knees 10/14/2018   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   Essential hypertension 02/16/2016   Multiple sclerosis (HCC) 04/06/2013    ONSET DATE: ***  REFERRING DIAG: lymphedema   THERAPY DIAG:  Lymphedema, not elsewhere classified  Rationale for Evaluation and Treatment Rehabilitation  SUBJECTIVE:   SUBJECTIVE STATEMENT: *** Pt accompanied by: {accompnied:27141}  PERTINENT HISTORY: ***  PRECAUTIONS: {Therapy precautions:24002}  WEIGHT BEARING RESTRICTIONS {Yes ***/No:24003}  PAIN:  Are you having pain? {OPRCPAIN:27236}  FALLS: Has patient fallen in last 6 months? {fallsyesno:27318}  LIVING ENVIRONMENT: Lives with: {OPRC lives with:25569::"lives with their family"} Lives in: {Lives in:25570} Stairs: {opstairs:27293} Has following equipment at home: {Assistive devices:23999}  PLOF: {PLOF:24004}  PATIENT GOALS ***  OBJECTIVE:   HAND DOMINANCE: {MISC; OT HAND DOMINANCE:[phone]}  ADLs: Overall ADLs: *** Transfers/ambulation related to ADLs: Eating: *** Grooming: *** UB Dressing: *** LB Dressing: *** Toileting: *** Bathing: *** Tub Shower transfers: *** Equipment: {equipment:25573}   IADLs: Shopping: *** Light housekeeping: *** Meal Prep: *** Community mobility: *** Medication management: Architect  management: *** Handwriting: {OTWRITTENEXPRESSION:25361}  MOBILITY STATUS: {OTMOBILITY:25360}  POSTURE COMMENTS:  {posture:25561} Sitting balance: {sitting balance:25483}  ACTIVITY TOLERANCE: Activity tolerance: ***  FUNCTIONAL OUTCOME MEASURES: {OTFUNCTIONALMEASURES:27238}  UPPER EXTREMITY ROM     {AROM/PROM:27142} ROM Right eval Left eval  Shoulder  flexion    Shoulder abduction    Shoulder adduction    Shoulder extension    Shoulder internal rotation    Shoulder external rotation    Elbow flexion    Elbow extension    Wrist flexion    Wrist extension    Wrist ulnar deviation    Wrist radial deviation    Wrist pronation    Wrist supination    (Blank rows = not tested)   UPPER EXTREMITY MMT:     MMT Right eval Left eval  Shoulder flexion    Shoulder abduction    Shoulder adduction    Shoulder extension    Shoulder internal rotation    Shoulder external rotation    Middle trapezius    Lower trapezius    Elbow flexion    Elbow extension    Wrist flexion    Wrist extension    Wrist ulnar deviation    Wrist radial deviation    Wrist pronation    Wrist supination    (Blank rows = not tested)  HAND FUNCTION: {handfunction:27230}  COORDINATION: {otcoordination:27237}  SENSATION: {sensation:27233}  EDEMA: ***  MUSCLE TONE: {UETONE:25567}  COGNITION: Overall cognitive status: {cognition:24006}  VISION: Subjective report: *** Baseline vision: {OTBASELINEVISION:25363} Visual history: {OTVISUALHISTORY:25364}  VISION ASSESSMENT: {visionassessment:27231}  Patient has difficulty with following activities due to following visual impairments: ***  PERCEPTION: {Perception:25564}  PRAXIS: {Praxis:25565}  OBSERVATIONS: ***   TODAY'S TREATMENT:  ***   PATIENT EDUCATION: Education details: *** Person educated: {Person educated:25204} Education method: {Education Method:25205} Education comprehension: {Education Comprehension:25206}   HOME EXERCISE PROGRAM: ***    GOALS: Goals reviewed with patient? {yes/no:20286}  SHORT TERM GOALS: Target date: ***  *** Baseline: Goal status: {GOALSTATUS:25110}  2.  *** Baseline:  Goal status: {GOALSTATUS:25110}  3.  *** Baseline:  Goal status: {GOALSTATUS:25110}  4.  *** Baseline:  Goal status: {GOALSTATUS:25110}  5.  *** Baseline:  Goal  status: {GOALSTATUS:25110}  6.  *** Baseline:  Goal status: {GOALSTATUS:25110}  LONG TERM GOALS: Target date: ***  *** Baseline:  Goal status: {GOALSTATUS:25110}  2.  *** Baseline:  Goal status: {GOALSTATUS:25110}  3.  *** Baseline:  Goal status: {GOALSTATUS:25110}  4.  *** Baseline:  Goal status: {GOALSTATUS:25110}  5.  *** Baseline:  Goal status: {GOALSTATUS:25110}  6.  *** Baseline:  Goal status: {GOALSTATUS:25110}  ASSESSMENT:  CLINICAL IMPRESSION: Patient is a *** y.o. *** who was seen today for occupational therapy evaluation for ***.   PERFORMANCE DEFICITS in functional skills including {OT physical skills:25468}, cognitive skills including {OT cognitive skills:25469}, and psychosocial skills including {OT psychosocial skills:25470}.   IMPAIRMENTS are limiting patient from {OT performance deficits:25471}.   COMORBIDITIES {Comorbidities:25485} that affects occupational performance. Patient will benefit from skilled OT to address above impairments and improve overall function.  MODIFICATION OR ASSISTANCE TO COMPLETE EVALUATION: {OT modification:25474}  OT OCCUPATIONAL PROFILE AND HISTORY: {OT PROFILE AND HISTORY:25484}  CLINICAL DECISION MAKING: {OT CDM:25475}  REHAB POTENTIAL: {rehabpotential:25112}  EVALUATION COMPLEXITY: {Evaluation complexity:25115}    PLAN: OT FREQUENCY: {rehab frequency:25116}  OT DURATION: {rehab duration:25117}  PLANNED INTERVENTIONS: {OT Interventions:25467}  RECOMMENDED OTHER SERVICES: ***  CONSULTED AND AGREED WITH PLAN OF CARE: {QTM:22633}  PLAN FOR NEXT SESSION: ***   Judithann Sauger, OT  06/18/2022, 12:42 PM

## 2022-06-18 NOTE — Progress Notes (Signed)
HADASAH, BRUGGER ([id number]) Visit Report for 06/18/2022 Arrival Information Details Patient Name: [name], [name] Date of Service: 06/18/2022 1:15 PM Medical Record Number: [id number] Patient Account Number: [id number] Date of Birth/Sex: Nov 25, 1961 (61 y.o. F) Treating RN: Yevonne Pax Primary Care March Steyer: Card, Jonny Ruiz Other Clinician: Referring Mackie Goon: Card, John Treating Meria Crilly/Extender: Rowan Blase in Treatment: 35 Visit Information History Since Last Visit All ordered tests and consults were completed: No Patient Arrived: [name] Added or deleted any medications: No Arrival Time: 13:24 Any new allergies or adverse reactions: No Accompanied By: self Had a fall or experienced change in No Transfer Assistance: None activities of daily living that may affect Patient Identification Verified: Yes risk of falls: Secondary Verification Process Completed: Yes Signs or symptoms of abuse/neglect since last visito No Patient Requires Transmission-Based Precautions: No Hospitalized since last visit: No Patient Has Alerts: No Implantable device outside of the clinic excluding No cellular tissue based products placed in the center since last visit: Has Dressing in Place as Prescribed: Yes Pain Present Now: No Electronic Signature(s) Unsigned Entered ByYevonne Pax on 06/18/2022 13:25:15 Signature(s): Date(s): Breau, Janace CMarland Kitchen ([id number]) -------------------------------------------------------------------------------- Clinic Level of Care Assessment Details Patient Name: [name], [name] Date of Service: 06/18/2022 1:15 PM Medical Record Number: [id number] Patient Account Number: [id number] Date of Birth/Sex: November 29, 1961 (61 y.o. F) Treating RN: Yevonne Pax Primary Care Labib Cwynar: Card, Jonny Ruiz Other Clinician: Referring Mekhia Brogan: Card, John Treating Blondell Laperle/Extender: Rowan Blase in Treatment: 35 Clinic Level of Care Assessment Items TOOL 4 Quantity  Score X - Use when only an EandM is performed on FOLLOW-UP visit 1 0 ASSESSMENTS - Nursing Assessment / Reassessment X - Reassessment of Co-morbidities (includes updates in patient status) 1 10 X- 1 5 Reassessment of Adherence to Treatment Plan ASSESSMENTS - Wound and Skin Assessment / Reassessment X - Simple Wound Assessment / Reassessment - one wound 1 5 []  - 0 Complex Wound Assessment / Reassessment - multiple wounds []  - 0 Dermatologic / Skin Assessment (not related to wound area) ASSESSMENTS - Focused Assessment []  - Circumferential Edema Measurements - multi extremities 0 []  - 0 Nutritional Assessment / Counseling / Intervention []  - 0 Lower Extremity Assessment (monofilament, tuning fork, pulses) []  - 0 Peripheral Arterial Disease Assessment (using hand held doppler) ASSESSMENTS - Ostomy and/or Continence Assessment and Care []  - Incontinence Assessment and Management 0 []  - 0 Ostomy Care Assessment and Management (repouching, etc.) PROCESS - Coordination of Care X - Simple Patient / Family Education for ongoing care 1 15 []  - 0 Complex (extensive) Patient / Family Education for ongoing care []  - 0 Staff obtains , Records, Test Results / Process Orders []  - 0 Staff telephones HHA, Nursing Homes / Clarify orders / etc []  - 0 Routine Transfer to another Facility (non-emergent condition) []  - 0 Routine Hospital Admission (non-emergent condition) []  - 0 New Admissions / / Ordering NPWT, Apligraf, etc. []  - 0 Emergency Hospital Admission (emergent condition) X- 1 10 Simple Discharge Coordination []  - 0 Complex (extensive) Discharge Coordination PROCESS - Special Needs []  - Pediatric / Minor Patient Management 0 []  - 0 Isolation Patient Management []  - 0 Hearing / Language / Visual special needs []  - 0 Assessment of Community assistance (transportation, D/C planning, etc.) []  - 0 Additional assistance / Altered mentation []  -  0 Support Surface(s) Assessment (bed, cushion, seat, etc.) INTERVENTIONS - Wound Cleansing / Measurement Buboltz, Faylynn C. ( ) X- 1 5 Simple Wound Cleansing - one wound []  - 0  Complex Wound Cleansing - multiple wounds X- 1 5 Wound Imaging (photographs - any number of wounds) []  - 0 Wound Tracing (instead of photographs) X- 1 5 Simple Wound Measurement - one wound []  - 0 Complex Wound Measurement - multiple wounds INTERVENTIONS - Wound Dressings X - Small Wound Dressing one or multiple wounds 1 10 []  - 0 Medium Wound Dressing one or multiple wounds []  - 0 Large Wound Dressing one or multiple wounds []  - 0 Application of Medications - topical []  - 0 Application of Medications - injection INTERVENTIONS - Miscellaneous []  - External ear exam 0 []  - 0 Specimen Collection (cultures, biopsies, blood, body fluids, etc.) []  - 0 Specimen(s) / Culture(s) sent or taken to Lab for analysis []  - 0 Patient Transfer (multiple staff / / Similar devices) []  - 0 Simple Staple / Suture removal (25 or less) []  - 0 Complex Staple / Suture removal (26 or more) []  - 0 Hypo / Hyperglycemic Management (close monitor of Blood Glucose) []  - 0 Ankle / Brachial Index (ABI) - do not check if billed separately X- 1 5 Vital Signs Has the patient been seen at the hospital within the last three years: Yes Total Score: 75 Level Of Care: New/Established - Level 2 Electronic Signature(s) Unsigned Entered By on 06/18/2022 13:49:19 Signature(s): Date(s): Calderone, Suesan C ( ) -------------------------------------------------------------------------------- Encounter Discharge Information Details Patient Name: [name], [name] Date of Service: 06/18/2022 1:15 PM Medical Record Number: Patient Account Number: Date of Birth/Sex: 1961-01-21 (61 y.o. F) Treating RN: Nurse, adult Primary Care Aashir Umholtz: Other  Clinician: Referring Brown Dunlap: Card, John Treating Timmia Cogburn/Extender: in Treatment: 92 Encounter Discharge Information Items Discharge Condition: Stable Ambulatory Status: Walker Discharge Destination: Home Transportation: Private Auto Accompanied By: self Schedule Follow-up Appointment: Yes Clinical Summary of Care: Patient Declined Electronic Signature(s) Unsigned Entered By on 06/18/2022 13:52:04 Signature(s): Date(s): Janicki, Mehr C. (06/20/2022) -------------------------------------------------------------------------------- Lower Extremity Assessment Details Patient Name: [name], [name] Date of Service: 06/18/2022 1:15 PM Medical Record Number: [id number] Patient Account Number: 06/20/2022 Date of Birth/Sex: [birth year] (61 y.o. F) Treating RN: 09/11/1961 Primary Care Shelba Susi: 68 Other Clinician: Referring Dominique Calvey: Card, John Treating Caragh Gasper/Extender: Yevonne Pax in Treatment: 35 Electronic Signature(s) Unsigned Entered By: [name] on 06/18/2022 13:26:21 Signature(s): Date(s): Craney, Rielle C. (31) -------------------------------------------------------------------------------- Multi Wound Chart Details Patient Name: [name], [name] Date of Service: 06/18/2022 1:15 PM Medical Record Number: 06/20/2022 Patient Account Number: [id number] Date of Birth/Sex: [birth year] (61 y.o. F) Treating RN: [id number] Primary Care Keiko Myricks: 09/11/1961 Other Clinician: Referring Jayla Mackie: Card, John Treating Lawanna Cecere/Extender: 68 in Treatment: 35 Vital Signs Height(in): 58 Pulse(bpm): 81 Weight(lbs): 275 Blood Pressure(mmHg): 119/86 Body Mass Index(BMI): 57.5 Temperature(F): 98.1 Respiratory Rate(breaths/min): 18 Photos: [N/A:N/A] Wound Location: Distal, Midline Back N/A N/A Wounding Event: Surgical Injury N/A N/A Primary Etiology: Dehisced Wound N/A N/A Comorbid History:  Hypertension N/A N/A Date Acquired: 04/11/2021 N/A N/A Weeks of Treatment: 35 N/A N/A Wound Status: Open N/A N/A Wound Recurrence: No N/A N/A Measurements L x W x D (cm) 0.4x0.3x2.7 N/A N/A Area (cm) : 0.094 N/A N/A Volume (cm) : 0.254 N/A N/A % Reduction in Area: 25.40% N/A N/A % Reduction in Volume: 56.10% N/A N/A Classification: Full Thickness Without Exposed N/A N/A Support Structures Exudate Amount: Medium N/A N/A Exudate Type: Purulent N/A N/A Exudate Color: yellow, brown, green N/A N/A Granulation Amount: Large (67-100%) N/A N/A Granulation Quality: Red N/A N/A Necrotic  Amount: None Present (0%) N/A N/A Exposed Structures: Fat Layer (Subcutaneous Tissue): N/A N/A Yes Fascia: No Tendon: No Muscle: No Joint: No Bone: No Epithelialization: None N/A N/A Treatment Notes Electronic Signature(s) Unsigned Entered ByYevonne Pax on 06/18/2022 13:26:32 Signature(s): Date(s): Keshishyan, Emillie Salena Saner ([id number]) -------------------------------------------------------------------------------- Multi-Disciplinary Care Plan Details Patient Name: [name], [name] Date of Service: 06/18/2022 1:15 PM Medical Record Number: [id number] Patient Account Number: [id number] Date of Birth/Sex: [birth year] (61 y.o. F) Treating RN: Yevonne Pax Primary Care Suri Tafolla: [name] Other Clinician: Referring Sherlyn Ebbert: Card, John Treating Audyn Dimercurio/Extender: Rowan Blase in Treatment: 35 Active Inactive Wound/Skin Impairment Nursing Diagnoses: Knowledge deficit related to ulceration/compromised skin integrity Goals: Patient/caregiver will verbalize understanding of skin care regimen Date Initiated: 10/15/2021 Target Resolution Date: 06/15/2022 Goal Status: Active Ulcer/skin breakdown will have a volume reduction of 30% by week 4 Date Initiated: 10/15/2021 Date Inactivated: 01/29/2022 Target Resolution Date: 12/15/2021 Goal Status: Unmet Unmet Reason: comorbities Ulcer/skin breakdown  will have a volume reduction of 50% by week 8 Date Initiated: 10/15/2021 Date Inactivated: 01/29/2022 Target Resolution Date: 01/15/2022 Goal Status: Unmet Unmet Reason: comorbities Ulcer/skin breakdown will have a volume reduction of 80% by week 12 Date Initiated: 10/15/2021 Date Inactivated: 02/19/2022 Target Resolution Date: 02/15/2022 Goal Status: Unmet Unmet Reason: comorbities Ulcer/skin breakdown will heal within 14 weeks Date Initiated: 10/15/2021 Date Inactivated: 05/07/2022 Target Resolution Date: 03/15/2022 Goal Status: Unmet Unmet Reason: comorbities Interventions: Assess patient/caregiver ability to obtain necessary supplies Assess patient/caregiver ability to perform ulcer/skin care regimen upon admission and as needed Assess ulceration(s) every visit Notes: Electronic Signature(s) Unsigned Entered ByYevonne Pax on 06/18/2022 13:26:25 Signature(s): Date(s): Isais, Dehlia C. ([id number]) -------------------------------------------------------------------------------- Pain Assessment Details Patient Name: [name], [name] Date of Service: 06/18/2022 1:15 PM Medical Record Number: [id number] Patient Account Number: [id number] Date of Birth/Sex: February 11, 1961 (61 y.o. F) Treating RN: Yevonne Pax Primary Care Vayla Wilhelmi: [name] Other Clinician: Referring Theora Vankirk: Card, John Treating Antaeus Karel/Extender: Rowan Blase in Treatment: 35 Active Problems Location of Pain Severity and Description of Pain Patient Has Paino No Site Locations Pain Management and Medication Current Pain Management: Electronic Signature(s) Unsigned Entered ByYevonne Pax on 06/18/2022 13:25:39 Signature(s): Date(s): Eslick, Jerae Salena Saner ([id number]) -------------------------------------------------------------------------------- Patient/Caregiver Education Details Patient Name: [name], [name] Date of Service: 06/18/2022 1:15 PM Medical Record Number: [id number] Patient  Account Number: [id number] Date of Birth/Gender: May 19, 1961 (61 y.o. F) Treating RN: Yevonne Pax Primary Care Physician: [name] Other Clinician: Referring Physician: Card, John Treating Physician/Extender: Rowan Blase in Treatment: 46 Education Assessment Education Provided To: Patient Education Topics Provided Wound/Skin Impairment: Methods: Explain/Verbal Responses: State content correctly Electronic Signature(s) Unsigned Entered By: Yevonne Pax on 06/18/2022 13:50:21 Signature(s): Date(s): Brodersen, Dashanna C. ([id number]) -------------------------------------------------------------------------------- Wound Assessment Details Patient Name: [name], [name] Date of Service: 06/18/2022 1:15 PM Medical Record Number: [id number] Patient Account Number: [id number] Date of Birth/Sex: 05-26-1961 (61 y.o. F) Treating RN: Yevonne Pax Primary Care Robyne Matar: Card, Jonny Ruiz Other Clinician: Referring Welford Christmas: Card, John Treating Liyla Radliff/Extender: Rowan Blase in Treatment: 35 Wound Status Wound Number: 1 Primary Etiology: Dehisced Wound Wound Location: Distal, Midline Back Wound Status: Open Wounding Event: Surgical Injury Comorbid History: Hypertension Date Acquired: 04/11/2021 Weeks Of Treatment: 35 Clustered Wound: No Photos Wound Measurements Length: (cm) 0.4 Width: (cm) 0.3 Depth: (cm) 2.7 Area: (cm) 0.094 Volume: (cm) 0.254 % Reduction in Area: 25.4% % Reduction in Volume: 56.1% Epithelialization: None Tunneling: No Undermining: No Wound Description Classification: Full Thickness Without Exposed Support Structures Exudate Amount: Medium Exudate Type: Purulent Exudate Color:  yellow, brown, green Foul Odor After Cleansing: No Slough/Fibrino No Wound Bed Granulation Amount: Large (67-100%) Exposed Structure Granulation Quality: Red Fascia Exposed: No Necrotic Amount: None Present (0%) Fat Layer (Subcutaneous Tissue) Exposed: Yes Tendon  Exposed: No Muscle Exposed: No Joint Exposed: No Bone Exposed: No Treatment Notes Wound #1 (Back) Wound Laterality: Midline, Distal Cleanser Normal Saline Discharge Instruction: Wash your hands with soap and water. Remove old dressing, discard into plastic bag and place into trash. Cleanse the wound with Normal Saline prior to applying a clean dressing using gauze sponges, not tissues or cotton balls. Do not scrub or use excessive force. Pat dry using gauze sponges, not tissue or cotton balls. [name], [name] ([id number]) Peri-Wound Care Topical Primary Dressing Hydrofera Blue Rope Discharge Instruction: cut into fourths; angle the end Secondary Dressing (SILICONE BORDER) Zetuvit Plus SILICONE BORDER Dressing 4x4 (in/in) Secured With Compression Wrap Compression Stockings Add-Ons Electronic Signature(s) Unsigned Entered ByYevonne Pax on 06/18/2022 13:26:09 Signature(s): Date(s): [name], [name] ([id number]) -------------------------------------------------------------------------------- Vitals Details Patient Name: [name], [name] Date of Service: 06/18/2022 1:15 PM Medical Record Number: [id number] Patient Account Number: [id number] Date of Birth/Sex: [birth year] (61 y.o. F) Treating RN: Yevonne Pax Primary Care Halia Franey: [name] Other Clinician: Referring Galen Russman: Card, John Treating Taquisha Phung/Extender: Rowan Blase in Treatment: 35 Vital Signs Time Taken: 13:25 Temperature (F): 98.1 Height (in): 58 Pulse (bpm): 81 Weight (lbs): 275 Respiratory Rate (breaths/min): 18 Body Mass Index (BMI): 57.5 Blood Pressure (mmHg): 119/86 Reference Range: 80 - 120 mg / dl Electronic Signature(s) Unsigned Entered ByYevonne Pax on 06/18/2022 13:25:32 Signature(s): Date(s):

## 2022-06-18 NOTE — Therapy (Deleted)
[place] MAIN [facility] SERVICES [address] [place], [name], [address] Phone: [phone]   Fax:  [phone]  Occupational Therapy Treatment  Patient Details  Name: [name] MRN: [id number] Date of Birth: 20-Sep-1961 Referring Provider (OT): Donella Stade Arringtonm FNP   Encounter Date: 06/18/2022   OT End of Session - 06/18/22 1115     Visit Number 25    Number of Visits 36    Date for OT Re-Evaluation 08/12/22    OT Start Time 1105    OT Stop Time 1205    OT Time Calculation (min) 60 min    Activity Tolerance Patient tolerated treatment well;No increased pain    Behavior During Therapy WFL for tasks assessed/performed             Past Medical History:  Diagnosis Date   Arthritis    Back pain    Chronic knee pain    Edema of both lower extremities    High blood pressure    High cholesterol    Joint pain    Multiple sclerosis ([place])    Neuromuscular disorder (HCC)    Multiple Sclerosis over 20 years   Obesity    Vitamin D deficiency     Past Surgical History:  Procedure Laterality Date   APPENDECTOMY     CESAREAN SECTION     HAND SURGERY     lumbar back surgery     LUMBAR WOUND DEBRIDEMENT N/A 12/05/2020   Procedure: Lumbar Wound Exploration;  Surgeon: Ashok Pall, MD;  Location: [place];  Service: Neurosurgery;  Laterality: N/A;  3C/RM 21   LUMBAR WOUND DEBRIDEMENT N/A 02/21/2021   Procedure: Lumbar wound exploration;  Surgeon: Ashok Pall, MD;  Location: [place];  Service: Neurosurgery;  Laterality: N/A;  posterior   LUMBAR WOUND DEBRIDEMENT N/A 04/11/2021   Procedure: Wound Exploration with Wound Vac Placement;  Surgeon: Ashok Pall, MD;  Location: [place];  Service: Neurosurgery;  Laterality: N/A;   ROTATOR CUFF REPAIR     TONSILLECTOMY      There were no vitals filed for this visit.   Subjective Assessment - 06/18/22 1121     Subjective  Arva C Topham presents for OT Rx to address treatment of BLE  lymphedema. Pt presents   in manual transport w/c w custom compression garment in place on the RLE, and multilayer compression wraps on the LLE. Pt denies LE related pain in her legs, and she has no new concerns.    Pertinent History B knee OA, HTN, Obesity, MS, Spondylolisthesis-lumbar, open wound at lumbar surgical site since 4/22.    Limitations difficulty walking, impaired transfers, chronic OA pain in bilateral knees, non healing post surgical lumbar wound, BLE muscle weakness 2/2 MS, chronic leg swelling with minimal pain,    Special Tests - Stemmer bilaterally; Intake FOTO score =31/100    Patient Stated Goals get my legs better so I can do more    Pain Onset Other (comment)   6 mnths ago without precipitating event. No family hx   Pain Onset Other (comment)   years                         OT Treatments/Exercises (OP) - 06/18/22 1252       ADLs   ADL Education Given Yes      Manual Therapy   Manual Therapy Edema management    Manual Lymphatic Drainage (MLD) MLD to LLE/ LLQ utilizing  diaphragmatic breathing to access deep abdominal pathways, short neck sequence and inguinal LN, then proovided MLD from proximal to distal addressing thigh, leg and foot. Foinished up session with short neck x 3 and deep breathing.    Compression Bandaging LLE multilayer compression  wraps frome base of toes to popliteal using gradient techniques                    OT Education - 06/18/22 1254     Education Details Continued Pt/ CG edu for lymphedema self care home program throughout session. Topics include multilayer compression wrapping, simple self-MLD, therapeutic lymphatic pumping exercises, skin/nail care, infection and progression risk reduction factors (LE precautions) , compression garment recommendations and specifications, wear and care schedule and compression garment donning / doffing w assistive devices. Discussed progress towards all OT goals since commencing CDT.  All questions answered to the Pt's satisfaction. Good return.    Person(s) Educated Patient    Methods Explanation;Demonstration;Handout    Comprehension Verbalized understanding;Returned demonstration;Need further instruction                 OT Long Term Goals - 05/20/22 1312       OT LONG TERM GOAL #1   Title Given this patient's Intake score 31 /100 on the functional outcomes FOTO tool, patient will experience an increase in function of 5 points to improve basic and instrumental ADLs performance, including lymphedema self-care.    Baseline 31/100    Period Weeks    Status On-going   TBA next visit   Target Date 08/18/22      OT LONG TERM GOAL #2   Title Pt will demonstrate understanding of lymphedema prevention strategies by identifying and discussing 5 precautions using printed reference (modified assistance) to reduce risk of progression and to limit infection risk.    Baseline Max A    Time 4    Period Days    Status Achieved    Target Date --      OT LONG TERM GOAL #3   Title With Maximum caregiver assistance Pt will be able to apply multilayer, LUE compression wraps using gradient techniques to decrease limb volume, to limit infection risk, and to limit lymphedema progression. Pt unable to wrap legs independently. Trained caregiver must assist during visit intervals for CDT to be effective.    Baseline dependent - Max caregiver assistance is necessary to complete Intensive Phase of Complete Decongestive Therapy and Lymphedema self-care home program  as Pt is unable to reach his feet to apply compression wraps and garments, to inspect skin, to groom nails to perform skin care and inspection and to perform simple self-MLD. Pt agrees to arrange for consistent caregiver prior to commencing CDT.    Time 4    Period Days    Status Achieved   Met and exceeded. Pt is able to apply BLE multilayer compression wraps using correct gradient techniques with modified independence (  extra time).   Target Date --   4th OT Rx visit     OT LONG TERM GOAL #4   Title With Maximum caregiver assistance between OT sessions Pt will achieve at least a 10% limb volume reduction below the knee bilaterally to return limb to more normal size and shape, to limit infection risk, to decrease pain, to improve function, and to limit lymphedema progression.   Pt has achieved 8% voume reduction to date , which is excellent progress towards goals.   Baseline dependent  Time 12    Period Weeks    Status Partially Met   RLE A-D volume is reduced by 8.5% since commencing CDT on 3/l/23. LLE is reduced in volume by 5.4% to date. Pt has achieved these reductions without CG assistance.   Target Date 08/18/22      OT LONG TERM GOAL #5   Title With caregiver assistance  Pt will achieve and sustain a least 85% compliance with all 4 LE self-care home program components throughout Intensive Phase CDT, including modified simple self-MLD, daily skin inspection and care, lymphatic pumping the ex, 23/7 compression wraps to optimize limb volume reductions, to limit lymphedema progression and to limit further functional decline.    Baseline Dependent    Time 12    Period Weeks    Status Achieved   Goal met and exceeded. Pt remains > 85% compliant with LE home program   with modified independence (extra time).   Target Date 05/16/22                   Plan - 06/18/22 1118     Clinical Impression Statement Continued MLD and simultaneous skin care to LLE/ LLQ as established. Reapplied compression wraps as established. Pt cont to make steady progress towards all OT goals for LE care. Pt demonstrates excellent compliance with R custom compression garment. we're awaiting delivery of the left for fitting. Pt agrees with plan to decrease OT frequency to 1 x weekly until garment fittings are complete.    OT Occupational Profile and History Detailed Assessment- Review of Records and additional review of  physical, cognitive, psychosocial history related to current functional performance    Occupational performance deficits (Please refer to evaluation for details): ADL's;IADL's;Rest and Sleep;Leisure;Social Participation;Work    Writer Several treatment options, min-mod task modification necessary    Comorbidities Affecting Occupational Performance: May have comorbidities impacting occupational performance    Modification or Assistance to Complete Evaluation  Min-Moderate modification of tasks or assist with assess necessary to complete eval    OT Frequency 2x / week    OT Duration 12 weeks    OT Treatment/Interventions Self-care/ADL training;DME and/or AE instruction;Manual lymph drainage;Compression bandaging;Therapeutic activities;Coping strategies training;Therapeutic exercise;Other (comment);Manual Therapy;Energy conservation;Patient/family education   skin care, Flexitouch trial, fit with appropriate compression garments that are comfortable, effective and that Pt is able to don and doff using assistive devices PRN   Recommended Other Services " Calcium channel blockers may not directly impair lymphatic function. However our results show that a reduced lymphatic function predisposes to CBC edema, which may explain why some patients develop edema during treatment." [employer], Niklas Telinius, [employer] Billings, and Vibeke Hjortdal.  Reduced Lymphatic Function Predisposes to Calcium Channel Blocker Edema: A Randomized Placebo-Controlled Clinical Trial.  Lymphatic Research and Biology.Apr [email or web address]  Published in Volume: 18 Issue 2: April 16, 2019  Online Ahead of Print:August 18, 2018    Consulted and Agree with Plan of Care Patient             Patient will benefit from skilled therapeutic intervention in order to improve the following deficits and impairments:           Visit Diagnosis: Lymphedema,  not elsewhere classified    Problem List Patient Active Problem List   Diagnosis Date Noted   Postoperative seroma of subcutaneous tissue after non-dermatologic procedure 04/11/2021   Wound drainage 02/21/2021   Wound dehiscence 02/21/2021   Postoperative seroma of musculoskeletal  structure after musculoskeletal procedure 12/05/2020   Spondylolisthesis of lumbar region 11/02/2020   Insulin resistance 02/15/2020   Class 3 severe obesity with serious comorbidity and body mass index (BMI) of 45.0 to 49.9 in adult [facility]) 01/19/2020   Vitamin D deficiency 01/18/2020   Other hyperlipidemia 02/22/2019   Primary osteoarthritis of both knees 10/14/2018   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   Essential hypertension 02/16/2016   Multiple sclerosis ([place]) 04/06/2013    Andrey Spearman, MS, OTR/L, CLT-LANA 06/18/22 1:03 PM   [place] MAIN [facility] SERVICES [place], [name], [address] Phone: [phone]   Fax:  [phone]  Name: GURLEEN LARRIVEE MRN: [id number] Date of Birth: [birth year]

## 2022-06-18 NOTE — Therapy (Addendum)
[place] MA[place] [facility] SERVICES [address] [place], [name], [address] Phone: [phone]   Fax:  [phone]  Occupational Therapy Treatment  Patient Details  Name: [name] MRN: [id number] Date of Birth: 20-Sep-1961 Referring Provider (OT): Donella Stade Arringtonm FNP   Encounter Date: 06/18/2022   OT End of Session - 06/18/22 1115     Visit Number 25    Number of Visits 36    Date for OT Re-Evaluation 08/12/22    OT Start Time 1105    OT Stop Time 1205    OT Time Calculation (min) 60 min    Activity Tolerance Patient tolerated treatment well;No increased pain    Behavior During Therapy WFL for tasks assessed/performed             Past Medical History:  Diagnosis Date   Arthritis    Back pain    Chronic knee pain    Edema of both lower extremities    High blood pressure    High cholesterol    Joint pain    Multiple sclerosis ([place])    Neuromuscular disorder (HCC)    Multiple Sclerosis over 20 years   Obesity    Vitamin D deficiency     Past Surgical History:  Procedure Laterality Date   APPENDECTOMY     CESAREAN SECTION     HAND SURGERY     lumbar back surgery     LUMBAR WOUND DEBRIDEMENT N/A 12/05/2020   Procedure: Lumbar Wound Exploration;  Surgeon: Ashok Pall, MD;  Location: [place];  Service: Neurosurgery;  Laterality: N/A;  3C/RM 21   LUMBAR WOUND DEBRIDEMENT N/A 02/21/2021   Procedure: Lumbar wound exploration;  Surgeon: Ashok Pall, MD;  Location: [place];  Service: Neurosurgery;  Laterality: N/A;  posterior   LUMBAR WOUND DEBRIDEMENT N/A 04/11/2021   Procedure: Wound Exploration with Wound Vac Placement;  Surgeon: Ashok Pall, MD;  Location: [place];  Service: Neurosurgery;  Laterality: N/A;   ROTATOR CUFF REPAIR     TONSILLECTOMY      There were no vitals filed for this visit.   Subjective Assessment - 06/18/22 1121     Subjective  [name] presents for OT Rx to address treatment of BLE  lymphedema. Pt presents   in manual transport w/c w custom compression garment in place on the RLE, and multilayer compression wraps on the LLE. Pt denies LE related pain in her legs, and she has no new concerns.    Pertinent History B knee OA, HTN, Obesity, MS, Spondylolisthesis-lumbar, open wound at lumbar surgical site since 4/22.    Limitations difficulty walking, impaired transfers, chronic OA pain in bilateral knees, non healing post surgical lumbar wound, BLE muscle weakness 2/2 MS, chronic leg swelling with minimal pain,    Special Tests - Stemmer bilaterally; Intake FOTO score =31/100    Patient Stated Goals get my legs better so I can do more    Pain Onset Other (comment)   6 mnths ago without precipitating event. No family hx   Pain Onset Other (comment)   years                         OT Treatments/Exercises (OP) - 06/18/22 1252       ADLs   ADL Education Given Yes      Manual Therapy   Manual Therapy Edema management    Manual Lymphatic Drainage (MLD) MLD to LLE/ LLQ utilizing  diaphragmatic breathing to access deep abdominal pathways, short neck sequence and inguinal LN, then proovided MLD from proximal to distal addressing thigh, leg and foot. Foinished up session with short neck x 3 and deep breathing.    Compression Bandaging LLE multilayer compression  wraps frome base of toes to popliteal using gradient techniques                    OT Education - 06/18/22 1254     Education Details Continued Pt/ CG edu for lymphedema self care home program throughout session. Topics include multilayer compression wrapping, simple self-MLD, therapeutic lymphatic pumping exercises, skin/nail care, infection and progression risk reduction factors (LE precautions) , compression garment recommendations and specifications, wear and care schedule and compression garment donning / doffing w assistive devices. Discussed progress towards all OT goals since commencing CDT.  All questions answered to the Pt's satisfaction. Good return.    Person(s) Educated Patient    Methods Explanation;Demonstration;Handout    Comprehension Verbalized understanding;Returned demonstration;Need further instruction                 OT Long Term Goals - 05/20/22 1312       OT LONG TERM GOAL #1   Title Given this patient's Intake score 31 /100 on the functional outcomes FOTO tool, patient will experience an increase in function of 5 points to improve basic and instrumental ADLs performance, including lymphedema self-care.    Baseline 31/100    Period Weeks    Status On-going   TBA next visit   Target Date 08/18/22      OT LONG TERM GOAL #2   Title Pt will demonstrate understanding of lymphedema prevention strategies by identifying and discussing 5 precautions using printed reference (modified assistance) to reduce risk of progression and to limit infection risk.    Baseline Max A    Time 4    Period Days    Status Achieved    Target Date --      OT LONG TERM GOAL #3   Title With Maximum caregiver assistance Pt will be able to apply multilayer, LUE compression wraps using gradient techniques to decrease limb volume, to limit infection risk, and to limit lymphedema progression. Pt unable to wrap legs independently. Trained caregiver must assist during visit intervals for CDT to be effective.    Baseline dependent - Max caregiver assistance is necessary to complete Intensive Phase of Complete Decongestive Therapy and Lymphedema self-care home program  as Pt is unable to reach his feet to apply compression wraps and garments, to inspect skin, to groom nails to perform skin care and inspection and to perform simple self-MLD. Pt agrees to arrange for consistent caregiver prior to commencing CDT.    Time 4    Period Days    Status Achieved   Met and exceeded. Pt is able to apply BLE multilayer compression wraps using correct gradient techniques with modified independence (  extra time).   Target Date --   4th OT Rx visit     OT LONG TERM GOAL #4   Title With Maximum caregiver assistance between OT sessions Pt will achieve at least a 10% limb volume reduction below the knee bilaterally to return limb to more normal size and shape, to limit infection risk, to decrease pain, to improve function, and to limit lymphedema progression.   Pt has achieved 8% voume reduction to date , which is excellent progress towards goals.   Baseline dependent  Time 12    Period Weeks    Status Partially Met   RLE A-D volume is reduced by 8.5% since commencing CDT on 3/l/23. LLE is reduced in volume by 5.4% to date. Pt has achieved these reductions without CG assistance.   Target Date 08/18/22      OT LONG TERM GOAL #5   Title With caregiver assistance  Pt will achieve and sustain a least 85% compliance with all 4 LE self-care home program components throughout Intensive Phase CDT, including modified simple self-MLD, daily skin inspection and care, lymphatic pumping the ex, 23/7 compression wraps to optimize limb volume reductions, to limit lymphedema progression and to limit further functional decline.    Baseline Dependent    Time 12    Period Weeks    Status Achieved   Goal met and exceeded. Pt remains > 85% compliant with LE home program   with modified independence (extra time).   Target Date 05/16/22                   Plan - 06/18/22 1118     Clinical Impression Statement Continued MLD and simultaneous skin care to LLE/ LLQ as established. Reapplied compression wraps as established. Pt cont to make steady progress towards all OT goals for LE care. Pt demonstrates excellent compliance with R custom compression garment. we're awaiting delivery of the left for fitting. Pt agrees with plan to decrease OT frequency to 1 x weekly until garment fittings are complete.    OT Occupational Profile and History Detailed Assessment- Review of Records and additional review of  physical, cognitive, psychosocial history related to current functional performance    Occupational performance deficits (Please refer to evaluation for details): ADL's;IADL's;Rest and Sleep;Leisure;Social Participation;Work    Writer Several treatment options, min-mod task modification necessary    Comorbidities Affecting Occupational Performance: May have comorbidities impacting occupational performance    Modification or Assistance to Complete Evaluation  Min-Moderate modification of tasks or assist with assess necessary to complete eval    OT Frequency 2x / week    OT Duration 12 weeks    OT Treatment/Interventions Self-care/ADL training;DME and/or AE instruction;Manual lymph drainage;Compression bandaging;Therapeutic activities;Coping strategies training;Therapeutic exercise;Other (comment);Manual Therapy;Energy conservation;Patient/family education   skin care, Flexitouch trial, fit with appropriate compression garments that are comfortable, effective and that Pt is able to don and doff using assistive devices PRN   Recommended Other Services " Calcium channel blockers may not directly impair lymphatic function. However our results show that a reduced lymphatic function predisposes to CBC edema, which may explain why some patients develop edema during treatment." [employer], Niklas Telinius, [employer], and Vibeke Hjortdal.  Reduced Lymphatic Function Predisposes to Calcium Channel Blocker Edema: A Randomized Placebo-Controlled Clinical Trial.  Lymphatic Research and Biology.Apr [email or web address]  Published in Volume: 18 Issue 2: April 16, 2019  Online Ahead of Print:August 18, 2018    Consulted and Agree with Plan of Care Patient             Patient will benefit from skilled therapeutic intervention in order to improve the following deficits and impairments:           Visit Diagnosis: Lymphedema,  not elsewhere classified    Problem List Patient Active Problem List   Diagnosis Date Noted   Postoperative seroma of subcutaneous tissue after non-dermatologic procedure 04/11/2021   Wound drainage 02/21/2021   Wound dehiscence 02/21/2021   Postoperative seroma of musculoskeletal  structure after musculoskeletal procedure 12/05/2020   Spondylolisthesis of lumbar region 11/02/2020   Insulin resistance 02/15/2020   Class 3 severe obesity with serious comorbidity and body mass index (BMI) of 45.0 to 49.9 in adult [facility]) 01/19/2020   Vitamin D deficiency 01/18/2020   Other hyperlipidemia 02/22/2019   Primary osteoarthritis of both knees 10/14/2018   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

## 2022-06-21 ENCOUNTER — Encounter: Payer: 59 | Admitting: Occupational Therapy

## 2022-06-23 LAB — AEROBIC/ANAEROBIC CULTURE W GRAM STAIN (SURGICAL/DEEP WOUND): Gram Stain: NONE SEEN

## 2022-06-25 ENCOUNTER — Encounter: Payer: 59 | Admitting: Physician Assistant

## 2022-06-25 ENCOUNTER — Ambulatory Visit: Payer: 59 | Admitting: Occupational Therapy

## 2022-06-25 DIAGNOSIS — T8131XA Disruption of external operation (surgical) wound, not elsewhere classified, initial encounter: Secondary | ICD-10-CM | POA: Diagnosis not present

## 2022-06-25 NOTE — Progress Notes (Addendum)
PEACHES, VANOVERBEKE ([id number]) Visit Report for 06/25/2022 Arrival Information Details Patient Name: [name], [name] Date of Service: 06/25/2022 1:15 PM Medical Record Number: [id number] Patient Account Number: [id number] Date of Birth/Sex: [birth year] (61 y.o. F) Treating RN: Yevonne Pax Primary Care Yvon Mccord: Card, Jonny Ruiz Other Clinician: Referring Veeda Virgo: Card, John Treating Taneshia Lorence/Extender: Rowan Blase in Treatment: 36 Visit Information History Since Last Visit All ordered tests and consults were completed: No Patient Arrived: Gilmer Mor Added or deleted any medications: No Arrival Time: 13:25 Any new allergies or adverse reactions: No Accompanied By: self Had a fall or experienced change in No Transfer Assistance: None activities of daily living that may affect Patient Identification Verified: Yes risk of falls: Secondary Verification Process Completed: Yes Signs or symptoms of abuse/neglect since last visito No Patient Requires Transmission-Based Precautions: No Hospitalized since last visit: No Patient Has Alerts: No Implantable device outside of the clinic excluding No cellular tissue based products placed in the center since last visit: Has Dressing in Place as Prescribed: Yes Pain Present Now: No Electronic Signature(s) Signed: 06/26/2022 8:01:30 AM By: Yevonne Pax RN Entered By: Yevonne Pax on 06/25/2022 13:31:31 [name], [name] ([id number]) -------------------------------------------------------------------------------- Clinic Level of Care Assessment Details Patient Name: [name], [name] Date of Service: 06/25/2022 1:15 PM Medical Record Number: [id number] Patient Account Number: [id number] Date of Birth/Sex: 01-15-1961 (61 y.o. F) Treating RN: Yevonne Pax Primary Care Taylie Helder: Card, Jonny Ruiz Other Clinician: Referring Acelin Ferdig: Card, John Treating Jahkai Yandell/Extender: Rowan Blase in Treatment: 36 Clinic Level of Care Assessment Items TOOL 4  Quantity Score X - Use when only an EandM is performed on FOLLOW-UP visit 1 0 ASSESSMENTS - Nursing Assessment / Reassessment X - Reassessment of Co-morbidities (includes updates in patient status) 1 10 X- 1 5 Reassessment of Adherence to Treatment Plan ASSESSMENTS - Wound and Skin Assessment / Reassessment X - Simple Wound Assessment / Reassessment - one wound 1 5 []  - 0 Complex Wound Assessment / Reassessment - multiple wounds []  - 0 Dermatologic / Skin Assessment (not related to wound area) ASSESSMENTS - Focused Assessment []  - Circumferential Edema Measurements - multi extremities 0 []  - 0 Nutritional Assessment / Counseling / Intervention []  - 0 Lower Extremity Assessment (monofilament, tuning fork, pulses) []  - 0 Peripheral Arterial Disease Assessment (using hand held doppler) ASSESSMENTS - Ostomy and/or Continence Assessment and Care []  - Incontinence Assessment and Management 0 []  - 0 Ostomy Care Assessment and Management (repouching, etc.) PROCESS - Coordination of Care X - Simple Patient / Family Education for ongoing care 1 15 []  - 0 Complex (extensive) Patient / Family Education for ongoing care X- 1 10 Staff obtains , Records, Test Results / Process Orders []  - 0 Staff telephones HHA, Nursing Homes / Clarify orders / etc []  - 0 Routine Transfer to another Facility (non-emergent condition) []  - 0 Routine Hospital Admission (non-emergent condition) []  - 0 New Admissions / / Ordering NPWT, Apligraf, etc. []  - 0 Emergency Hospital Admission (emergent condition) X- 1 10 Simple Discharge Coordination []  - 0 Complex (extensive) Discharge Coordination PROCESS - Special Needs []  - Pediatric / Minor Patient Management 0 []  - 0 Isolation Patient Management []  - 0 Hearing / Language / Visual special needs []  - 0 Assessment of Community assistance (transportation, D/[name] planning, etc.) []  - 0 Additional assistance / Altered  mentation []  - 0 Support Surface(s) Assessment (bed, cushion, seat, etc.) INTERVENTIONS - Wound Cleansing / Measurement Daubert, Jniya [name]. ( ) X- 1 5 Simple Wound Cleansing -  one wound []  - 0 Complex Wound Cleansing - multiple wounds []  - 0 Wound Imaging (photographs - any number of wounds) []  - 0 Wound Tracing (instead of photographs) []  - 0 Simple Wound Measurement - one wound []  - 0 Complex Wound Measurement - multiple wounds INTERVENTIONS - Wound Dressings []  - Small Wound Dressing one or multiple wounds 0 X- 1 15 Medium Wound Dressing one or multiple wounds []  - 0 Large Wound Dressing one or multiple wounds []  - 0 Application of Medications - topical []  - 0 Application of Medications - injection INTERVENTIONS - Miscellaneous []  - External ear exam 0 []  - 0 Specimen Collection (cultures, biopsies, blood, body fluids, etc.) []  - 0 Specimen(s) / Culture(s) sent or taken to Lab for analysis []  - 0 Patient Transfer (multiple staff / / Similar devices) []  - 0 Simple Staple / Suture removal (25 or less) []  - 0 Complex Staple / Suture removal (26 or more) []  - 0 Hypo / Hyperglycemic Management (close monitor of Blood Glucose) []  - 0 Ankle / Brachial Index (ABI) - do not check if billed separately X- 1 5 Vital Signs Has the patient been seen at the hospital within the last three years: Yes Total Score: 80 Level Of Care: New/Established - Level 3 Electronic Signature(s) Signed: 06/26/2022 8:01:30 AM By: RN Entered By: on 06/25/2022 13:40:19 [name], [name] ( ) -------------------------------------------------------------------------------- Encounter Discharge Information Details Patient Name: Yackley, Sarajean [name]. Date of Service: 06/25/2022 1:15 PM Medical Record Number: Patient Account Number: Date of Birth/Sex: 12-Aug-1961 (61 y.o. F) Treating RN: Primary Care Leydi Winstead:  Nurse, adult Other Clinician: Referring Christal Lagerstrom: Card, John Treating Renso Swett/Extender: in Treatment: 25 Encounter Discharge Information Items Discharge Condition: Stable Ambulatory Status: Ambulatory Discharge Destination: Home Transportation: Private Auto Accompanied By: self Schedule Follow-up Appointment: Yes Clinical Summary of Care: Electronic Signature(s) Signed: 06/26/2022 8:01:30 AM By: RN Entered By: 06/28/2022 on 06/25/2022 13:42:13 [name], [name] (Yevonne Pax) -------------------------------------------------------------------------------- Lower Extremity Assessment Details Patient Name: Pauling, Nandita [name]. Date of Service: 06/25/2022 1:15 PM Medical Record Number: Marland Kitchen Patient Account Number: [id number] Date of Birth/Sex: 1961/02/22 (61 y.o. F) Treating RN: [id number] Primary Care Cydney Alvarenga: 09/11/1961 Other Clinician: Referring Evonte Prestage: Card, John Treating Kramer Hanrahan/Extender: 68 in Treatment: 36 Electronic Signature(s) Signed: 06/26/2022 8:01:30 AM By: [name] RN Entered By: Rowan Blase on 06/25/2022 13:33:13 [name], [name]6/30/2023 (Yevonne Pax) -------------------------------------------------------------------------------- Multi Wound Chart Details Patient Name: [name], [name] Date of Service: 06/25/2022 1:15 PM Medical Record Number: 06/27/2022 Patient Account Number: [id number] Date of Birth/Sex: December 30, 1961 (61 y.o. F) Treating RN: [id number] Primary Care Marlene Pfluger: 09/11/1961 Other Clinician: Referring Jishnu Jenniges: Card, John Treating Sandford Diop/Extender: 68 in Treatment: 36 Vital Signs Height(in): 58 Pulse(bpm): 97 Weight(lbs): 275 Blood Pressure(mmHg): 159/76 Body Mass Index(BMI): 57.5 Temperature(F): 98.2 Respiratory Rate(breaths/min): 18 Photos: [N/A:N/A] Wound Location: Distal, Midline Back N/A N/A Wounding Event: Surgical Injury N/A N/A Primary Etiology: Dehisced Wound N/A  N/A Comorbid History: Hypertension N/A N/A Date Acquired: 04/11/2021 N/A N/A Weeks of Treatment: 36 N/A N/A Wound Status: Open N/A N/A Wound Recurrence: No N/A N/A Measurements L x W x D (cm) 0.3x0.3x2.5 N/A N/A Area (cm) : 0.071 N/A N/A Volume (cm) : 0.177 N/A N/A % Reduction in Area: 43.70% N/A N/A % Reduction in Volume: 69.40% N/A N/A Classification: Full Thickness Without Exposed N/A N/A Support Structures Exudate Amount: Medium N/A N/A Exudate Type: Purulent N/A N/A Exudate Color:  yellow, brown, green N/A N/A Granulation Amount: Large (67-100%) N/A N/A Granulation Quality: Red N/A N/A Necrotic Amount: None Present (0%) N/A N/A Exposed Structures: Fat Layer (Subcutaneous Tissue): N/A N/A Yes Fascia: No Tendon: No Muscle: No Joint: No Bone: No Epithelialization: None N/A N/A Treatment Notes Electronic Signature(s) Signed: 06/26/2022 8:01:30 AM By: Yevonne Pax RN Entered By: Yevonne Pax on 06/25/2022 13:33:36 Guess, Domingo Pulse ([id number]) -------------------------------------------------------------------------------- Multi-Disciplinary Care Plan Details Patient Name: Thwaites, Zandria [name]. Date of Service: 06/25/2022 1:15 PM Medical Record Number: [id number] Patient Account Number: [id number] Date of Birth/Sex: 11/15/1961 (61 y.o. F) Treating RN: Yevonne Pax Primary Care Arney Mayabb: [name] Other Clinician: Referring Shakim Faith: Card, John Treating Cammeron Greis/Extender: Rowan Blase in Treatment: 36 Active Inactive Wound/Skin Impairment Nursing Diagnoses: Knowledge deficit related to ulceration/compromised skin integrity Goals: Patient/caregiver will verbalize understanding of skin care regimen Date Initiated: 10/15/2021 Target Resolution Date: 06/15/2022 Goal Status: Active Ulcer/skin breakdown will have a volume reduction of 30% by week 4 Date Initiated: 10/15/2021 Date Inactivated: 01/29/2022 Target Resolution Date: 12/15/2021 Goal Status: Unmet Unmet  Reason: comorbities Ulcer/skin breakdown will have a volume reduction of 50% by week 8 Date Initiated: 10/15/2021 Date Inactivated: 01/29/2022 Target Resolution Date: 01/15/2022 Goal Status: Unmet Unmet Reason: comorbities Ulcer/skin breakdown will have a volume reduction of 80% by week 12 Date Initiated: 10/15/2021 Date Inactivated: 02/19/2022 Target Resolution Date: 02/15/2022 Goal Status: Unmet Unmet Reason: comorbities Ulcer/skin breakdown will heal within 14 weeks Date Initiated: 10/15/2021 Date Inactivated: 05/07/2022 Target Resolution Date: 03/15/2022 Goal Status: Unmet Unmet Reason: comorbities Interventions: Assess patient/caregiver ability to obtain necessary supplies Assess patient/caregiver ability to perform ulcer/skin care regimen upon admission and as needed Assess ulceration(s) every visit Notes: Electronic Signature(s) Signed: 06/26/2022 8:01:30 AM By: Yevonne Pax RN Entered By: Yevonne Pax on 06/25/2022 13:33:27 [name], [name] ([id number]) -------------------------------------------------------------------------------- Pain Assessment Details Patient Name: Aspinall, Dorthy [name]. Date of Service: 06/25/2022 1:15 PM Medical Record Number: [id number] Patient Account Number: [id number] Date of Birth/Sex: 02-03-1961 (61 y.o. F) Treating RN: Yevonne Pax Primary Care Can Lucci: [name] Other Clinician: Referring Jazzlyn Huizenga: Card, John Treating Lary Eckardt/Extender: Rowan Blase in Treatment: 36 Active Problems Location of Pain Severity and Description of Pain Patient Has Paino No Site Locations Pain Management and Medication Current Pain Management: Electronic Signature(s) Signed: 06/26/2022 8:01:30 AM By: Yevonne Pax RN Entered By: Yevonne Pax on 06/25/2022 13:32:01 [name], [name] ([id number]) -------------------------------------------------------------------------------- Patient/Caregiver Education Details Patient Name: Michele, Tari [name]. Date of  Service: 06/25/2022 1:15 PM Medical Record Number: [id number] Patient Account Number: [id number] Date of Birth/Gender: 08-30-1961 (61 y.o. F) Treating RN: Yevonne Pax Primary Care Physician: [name] Other Clinician: Referring Physician: Card, John Treating Physician/Extender: Rowan Blase in Treatment: 35 Education Assessment Education Provided To: Patient Education Topics Provided Wound/Skin Impairment: Methods: Explain/Verbal Responses: State content correctly Electronic Signature(s) Signed: 06/26/2022 8:01:30 AM By: Yevonne Pax RN Entered By: Yevonne Pax on 06/25/2022 13:40:46 [name], [name] ([id number]) -------------------------------------------------------------------------------- Wound Assessment Details Patient Name: [name], [name] Date of Service: 06/25/2022 1:15 PM Medical Record Number: [id number] Patient Account Number: [id number] Date of Birth/Sex: [birth year] (61 y.o. F) Treating RN: Yevonne Pax Primary Care Mycal Conde: [name] Other Clinician: Referring Merit Gadsby: Card, John Treating Tali Coster/Extender: Rowan Blase in Treatment: 36 Wound Status Wound Number: 1 Primary Etiology: Dehisced Wound Wound Location: Distal, Midline Back Wound Status: Open Wounding Event: Surgical Injury Comorbid History: Hypertension Date Acquired: 04/11/2021 Weeks Of Treatment: 36 Clustered Wound: No Photos Wound Measurements Length: (cm) 0.3 Width: (cm) 0.3 Depth: (cm) 2.5 Area: (cm)  0.071 Volume: (cm) 0.177 % Reduction in Area: 43.7% % Reduction in Volume: 69.4% Epithelialization: None Tunneling: No Undermining: Yes Starting Position (o'clock): 9 Ending Position (o'clock): 3 Maximum Distance: (cm) 3.5 Wound Description Classification: Full Thickness Without Exposed Support Structu Exudate Amount: Medium Exudate Type: Purulent Exudate Color: yellow, brown, green res Foul Odor After Cleansing: No Slough/Fibrino No Wound Bed Granulation Amount:  Large (67-100%) Exposed Structure Granulation Quality: Red Fascia Exposed: No Necrotic Amount: None Present (0%) Fat Layer (Subcutaneous Tissue) Exposed: Yes Tendon Exposed: No Muscle Exposed: No Joint Exposed: No Bone Exposed: No Treatment Notes Wound #1 (Back) Wound Laterality: Midline, Distal Cleanser Normal Saline Woznick, Kanitra [name]. ([id number]) Discharge Instruction: Wash your hands with soap and water. Remove old dressing, discard into plastic bag and place into trash. Cleanse the wound with Normal Saline prior to applying a clean dressing using gauze sponges, not tissues or cotton balls. Do not scrub or use excessive force. Pat dry using gauze sponges, not tissue or cotton balls. Peri-Wound Care Topical Primary Dressing Hydrofera Blue Rope Discharge Instruction: cut into fourths; angle the end Secondary Dressing (SILICONE BORDER) Zetuvit Plus SILICONE BORDER Dressing 4x4 (in/in) Secured With Compression Wrap Compression Stockings Add-Ons Electronic Signature(s) Signed: 08/05/2022 2:32:33 PM By: Yevonne Pax RN Previous Signature: 06/26/2022 8:01:30 AM Version By: Yevonne Pax RN Entered By: Yevonne Pax on 08/05/2022 14:32:33 Karpel, Elanor [name]. ([id number]) -------------------------------------------------------------------------------- Vitals Details Patient Name: Macfarlane, Batsheva [name]. Date of Service: 06/25/2022 1:15 PM Medical Record Number: [id number] Patient Account Number: [id number] Date of Birth/Sex: [birth year] (61 y.o. F) Treating RN: Yevonne Pax Primary Care Lelah Rennaker: [name] Other Clinician: Referring Jadarius Commons: Card, John Treating Melaysia Streed/Extender: Rowan Blase in Treatment: 36 Vital Signs Time Taken: 13:31 Temperature (F): 98.2 Height (in): 58 Pulse (bpm): 97 Weight (lbs): 275 Respiratory Rate (breaths/min): 18 Body Mass Index (BMI): 57.5 Blood Pressure (mmHg): 159/76 Reference Range: 80 - 120 mg / dl Electronic Signature(s) Signed:  06/26/2022 8:01:30 AM By: Yevonne Pax RN Entered By: Yevonne Pax on 06/25/2022 13:31:51

## 2022-06-27 ENCOUNTER — Encounter: Payer: 59 | Admitting: Occupational Therapy

## 2022-07-01 ENCOUNTER — Encounter: Payer: 59 | Attending: Physician Assistant | Admitting: Physician Assistant

## 2022-07-01 DIAGNOSIS — I10 Essential (primary) hypertension: Secondary | ICD-10-CM | POA: Diagnosis not present

## 2022-07-01 DIAGNOSIS — L98422 Non-pressure chronic ulcer of back with fat layer exposed: Secondary | ICD-10-CM | POA: Insufficient documentation

## 2022-07-01 DIAGNOSIS — G35 Multiple sclerosis: Secondary | ICD-10-CM | POA: Diagnosis not present

## 2022-07-01 NOTE — Progress Notes (Addendum)
CARAL, WHAN ([id number]) Visit Report for 07/01/2022 Arrival Information Details Patient Name: [name], [name] Date of Service: 07/01/2022 12:30 PM Medical Record Number: [id number] Patient Account Number: [id number] Date of Birth/Sex: [birth year] (61 y.o. F) Treating RN: Yevonne Pax Primary Care Tacia Hindley: Card, Jonny Ruiz Other Clinician: Referring Rebekah Zackery: Card, John Treating Tyria Springer/Extender: Rowan Blase in Treatment: 37 Visit Information History Since Last Visit All ordered tests and consults were completed: No Patient Arrived: Wheel Chair Added or deleted any medications: No Arrival Time: 12:37 Any new allergies or adverse reactions: No Accompanied By: self Had a fall or experienced change in No Transfer Assistance: None activities of daily living that may affect Patient Identification Verified: Yes risk of falls: Secondary Verification Process Completed: Yes Signs or symptoms of abuse/neglect since last visito No Patient Requires Transmission-Based Precautions: No Hospitalized since last visit: No Patient Has Alerts: No Implantable device outside of the clinic excluding No cellular tissue based products placed in the center since last visit: Has Dressing in Place as Prescribed: Yes Pain Present Now: No Electronic Signature(s) Signed: 07/01/2022 4:23:03 PM By: Yevonne Pax RN Entered By: Yevonne Pax on 07/01/2022 12:38:22 [name], [name] ([id number]) -------------------------------------------------------------------------------- Clinic Level of Care Assessment Details Patient Name: [name], [name] Date of Service: 07/01/2022 12:30 PM Medical Record Number: [id number] Patient Account Number: [id number] Date of Birth/Sex: [birth year] (61 y.o. F) Treating RN: Yevonne Pax Primary Care Darnesha Diloreto: Card, Jonny Ruiz Other Clinician: Referring Anay Walter: Card, John Treating Admir Candelas/Extender: Rowan Blase in Treatment: 37 Clinic Level of Care Assessment Items TOOL  4 Quantity Score X - Use when only an EandM is performed on FOLLOW-UP visit 1 0 ASSESSMENTS - Nursing Assessment / Reassessment X - Reassessment of Co-morbidities (includes updates in patient status) 1 10 X- 1 5 Reassessment of Adherence to Treatment Plan ASSESSMENTS - Wound and Skin Assessment / Reassessment X - Simple Wound Assessment / Reassessment - one wound 1 5 []  - 0 Complex Wound Assessment / Reassessment - multiple wounds []  - 0 Dermatologic / Skin Assessment (not related to wound area) ASSESSMENTS - Focused Assessment []  - Circumferential Edema Measurements - multi extremities 0 []  - 0 Nutritional Assessment / Counseling / Intervention []  - 0 Lower Extremity Assessment (monofilament, tuning fork, pulses) []  - 0 Peripheral Arterial Disease Assessment (using hand held doppler) ASSESSMENTS - Ostomy and/or Continence Assessment and Care []  - Incontinence Assessment and Management 0 []  - 0 Ostomy Care Assessment and Management (repouching, etc.) PROCESS - Coordination of Care X - Simple Patient / Family Education for ongoing care 1 15 []  - 0 Complex (extensive) Patient / Family Education for ongoing care []  - 0 Staff obtains , Records, Test Results / Process Orders []  - 0 Staff telephones HHA, Nursing Homes / Clarify orders / etc []  - 0 Routine Transfer to another Facility (non-emergent condition) []  - 0 Routine Hospital Admission (non-emergent condition) []  - 0 New Admissions / / Ordering NPWT, Apligraf, etc. []  - 0 Emergency Hospital Admission (emergent condition) X- 1 10 Simple Discharge Coordination []  - 0 Complex (extensive) Discharge Coordination PROCESS - Special Needs []  - Pediatric / Minor Patient Management 0 []  - 0 Isolation Patient Management []  - 0 Hearing / Language / Visual special needs []  - 0 Assessment of Community assistance (transportation, D/[name] planning, etc.) []  - 0 Additional assistance / Altered  mentation []  - 0 Support Surface(s) Assessment (bed, cushion, seat, etc.) INTERVENTIONS - Wound Cleansing / Measurement Fulwider, Tamber [name]. ( ) X- 1 5 Simple Wound Cleansing -  one wound []  - 0 Complex Wound Cleansing - multiple wounds X- 1 5 Wound Imaging (photographs - any number of wounds) []  - 0 Wound Tracing (instead of photographs) X- 1 5 Simple Wound Measurement - one wound []  - 0 Complex Wound Measurement - multiple wounds INTERVENTIONS - Wound Dressings []  - Small Wound Dressing one or multiple wounds 0 X- 1 15 Medium Wound Dressing one or multiple wounds []  - 0 Large Wound Dressing one or multiple wounds []  - 0 Application of Medications - topical []  - 0 Application of Medications - injection INTERVENTIONS - Miscellaneous []  - External ear exam 0 []  - 0 Specimen Collection (cultures, biopsies, blood, body fluids, etc.) []  - 0 Specimen(s) / Culture(s) sent or taken to Lab for analysis []  - 0 Patient Transfer (multiple staff / / Similar devices) []  - 0 Simple Staple / Suture removal (25 or less) []  - 0 Complex Staple / Suture removal (26 or more) []  - 0 Hypo / Hyperglycemic Management (close monitor of Blood Glucose) []  - 0 Ankle / Brachial Index (ABI) - do not check if billed separately X- 1 5 Vital Signs Has the patient been seen at the hospital within the last three years: Yes Total Score: 80 Level Of Care: New/Established - Level 3 Electronic Signature(s) Signed: 07/01/2022 4:23:03 PM By: RN Entered By: on 07/01/2022 13:13:39 [name], [name] ( ) -------------------------------------------------------------------------------- Encounter Discharge Information Details Patient Name: [name], [name] Date of Service: 07/01/2022 12:30 PM Medical Record Number: Patient Account Number: Date of Birth/Sex: 1961-06-16 (61 y.o. F) Treating RN: Primary Care Jameil Whitmoyer: Other Clinician: Referring Endya Austin: Card, John Treating Argusta Mcgann/Extender: in Treatment: 37 Encounter Discharge Information Items Discharge Condition: Stable Ambulatory Status: Walker Discharge Destination: Home Transportation: Private Auto Accompanied By: self Schedule Follow-up Appointment: Yes Clinical Summary of Care: Electronic Signature(s) Signed: 07/01/2022 4:23:03 PM By: Yevonne Pax RN Entered By: Yevonne Pax on 07/01/2022 13:14:26 [name], [name] (Marland Kitchen) -------------------------------------------------------------------------------- Lower Extremity Assessment Details Patient Name: [name], [name] Date of Service: 07/01/2022 12:30 PM Medical Record Number: 09/01/2022 Patient Account Number: [id number] Date of Birth/Sex: [birth year] (61 y.o. F) Treating RN: 68 Primary Care Kadince Boxley: Yevonne Pax Other Clinician: Referring Archer Vise: Card, John Treating Naila Elizondo/Extender: [name] in Treatment: 37 Electronic Signature(s) Signed: 07/01/2022 4:23:03 PM By: 38 RN Entered By: 09/01/2022 on 07/01/2022 12:40:47 Barrie, Galya CYevonne Pax (09/01/2022) -------------------------------------------------------------------------------- Multi Wound Chart Details Patient Name: [name], [name] Date of Service: 07/01/2022 12:30 PM Medical Record Number: 09/01/2022 Patient Account Number: [id number] Date of Birth/Sex: [birth year] (61 y.o. F) Treating RN: 68 Primary Care Earle Troiano: Yevonne Pax Other Clinician: Referring Kyjuan Gause: Card, John Treating Nellene Courtois/Extender: [name] in Treatment: 37 Vital Signs Height(in): 58 Pulse(bpm): 84 Weight(lbs): 275 Blood Pressure(mmHg): 150/90 Body Mass Index(BMI): 57.5 Temperature(F): 98.5 Respiratory Rate(breaths/min): 18 Photos: [N/A:N/A] Wound Location: Distal, Midline Back N/A N/A Wounding Event: Surgical Injury N/A N/A Primary Etiology: Dehisced Wound N/A  N/A Comorbid History: Hypertension N/A N/A Date Acquired: 04/11/2021 N/A N/A Weeks of Treatment: 37 N/A N/A Wound Status: Open N/A N/A Wound Recurrence: No N/A N/A Measurements L x W x D (cm) 0.3x0.3x2.6 N/A N/A Area (cm) : 0.071 N/A N/A Volume (cm) : 0.184 N/A N/A % Reduction in Area: 43.70% N/A N/A % Reduction in Volume: 68.20% N/A N/A Classification: Full Thickness Without Exposed N/A N/A Support Structures Exudate Amount: Medium N/A N/A Exudate Type: Serosanguineous N/A N/A Exudate Color:  red, brown N/A N/A Granulation Amount: Large (67-100%) N/A N/A Granulation Quality: Red N/A N/A Necrotic Amount: None Present (0%) N/A N/A Exposed Structures: [name] Layer (Subcutaneous Tissue): N/A N/A Yes Fascia: No Tendon: No Muscle: No Joint: No Bone: No Epithelialization: None N/A N/A Treatment Notes Electronic Signature(s) Signed: 07/01/2022 4:23:03 PM By: Yevonne Pax RN Entered By: Yevonne Pax on 07/01/2022 12:41:04 Altland, Mychaela Salena [name] ([id number]) -------------------------------------------------------------------------------- Multi-Disciplinary Care Plan Details Patient Name: [name], [name] Date of Service: 07/01/2022 12:30 PM Medical Record Number: [id number] Patient Account Number: [id number] Date of Birth/Sex: [birth year] (61 y.o. F) Treating RN: Yevonne Pax Primary Care Dhrithi Riche: [name] Other Clinician: Referring Michaela Broski: Card, John Treating Bernabe Dorce/Extender: Rowan Blase in Treatment: 37 Active Inactive Wound/Skin Impairment Nursing Diagnoses: Knowledge deficit related to ulceration/compromised skin integrity Goals: Patient/caregiver will verbalize understanding of skin care regimen Date Initiated: 10/15/2021 Target Resolution Date: 06/15/2022 Goal Status: Active Ulcer/skin breakdown will have a volume reduction of 30% by week 4 Date Initiated: 10/15/2021 Date Inactivated: 01/29/2022 Target Resolution Date: 12/15/2021 Goal Status: Unmet Unmet Reason:  comorbities Ulcer/skin breakdown will have a volume reduction of 50% by week 8 Date Initiated: 10/15/2021 Date Inactivated: 01/29/2022 Target Resolution Date: 01/15/2022 Goal Status: Unmet Unmet Reason: comorbities Ulcer/skin breakdown will have a volume reduction of 80% by week 12 Date Initiated: 10/15/2021 Date Inactivated: 02/19/2022 Target Resolution Date: 02/15/2022 Goal Status: Unmet Unmet Reason: comorbities Ulcer/skin breakdown will heal within 14 weeks Date Initiated: 10/15/2021 Date Inactivated: 05/07/2022 Target Resolution Date: 03/15/2022 Goal Status: Unmet Unmet Reason: comorbities Interventions: Assess patient/caregiver ability to obtain necessary supplies Assess patient/caregiver ability to perform ulcer/skin care regimen upon admission and as needed Assess ulceration(s) every visit Notes: Electronic Signature(s) Signed: 07/01/2022 4:23:03 PM By: Yevonne Pax RN Entered By: Yevonne Pax on 07/01/2022 12:40:52 [name], [name] ([id number]) -------------------------------------------------------------------------------- Pain Assessment Details Patient Name: [name], [name] Date of Service: 07/01/2022 12:30 PM Medical Record Number: [id number] Patient Account Number: [id number] Date of Birth/Sex: [birth year] (61 y.o. F) Treating RN: Yevonne Pax Primary Care Javier Gell: [name] Other Clinician: Referring Brendyn Mclaren: Card, John Treating Jeris Easterly/Extender: Rowan Blase in Treatment: 37 Active Problems Location of Pain Severity and Description of Pain Patient Has Paino No Site Locations Pain Management and Medication Current Pain Management: Electronic Signature(s) Signed: 07/01/2022 4:23:03 PM By: Yevonne Pax RN Entered By: Yevonne Pax on 07/01/2022 12:39:30 [name], [name] ([id number]) -------------------------------------------------------------------------------- Patient/Caregiver Education Details Patient Name: Difiore, Ally [name]. Date of Service:  07/01/2022 12:30 PM Medical Record Number: [id number] Patient Account Number: [id number] Date of Birth/Gender: 04/06/1961 (61 y.o. F) Treating RN: Yevonne Pax Primary Care Physician: [name] Other Clinician: Referring Physician: Card, John Treating Physician/Extender: Rowan Blase in Treatment: 37 Education Assessment Education Provided To: Patient Education Topics Provided Wound/Skin Impairment: Methods: Explain/Verbal Responses: State content correctly Electronic Signature(s) Signed: 07/01/2022 4:23:03 PM By: Yevonne Pax RN Entered By: Yevonne Pax on 07/01/2022 13:13:53 [name], Fatmata [name]. ([id number]) -------------------------------------------------------------------------------- Wound Assessment Details Patient Name: Simien, Kassady [name]. Date of Service: 07/01/2022 12:30 PM Medical Record Number: [id number] Patient Account Number: [id number] Date of Birth/Sex: 01-10-1961 (61 y.o. F) Treating RN: Yevonne Pax Primary Care Shalisa Mcquade: [name] Other Clinician: Referring Tereasa Yilmaz: Card, John Treating Nykayla Marcelli/Extender: Rowan Blase in Treatment: 37 Wound Status Wound Number: 1 Primary Etiology: Dehisced Wound Wound Location: Distal, Midline Back Wound Status: Open Wounding Event: Surgical Injury Comorbid History: Hypertension Date Acquired: 04/11/2021 Weeks Of Treatment: 37 Clustered Wound: No Photos Wound Measurements Length: (cm) 0.3 Width: (cm) 0.3 Depth: (cm) 2.6 Area: (cm) 0.071  Volume: (cm) 0.184 % Reduction in Area: 43.7% % Reduction in Volume: 68.2% Epithelialization: None Tunneling: No Undermining: Yes Starting Position (o'clock): 9 Ending Position (o'clock): 3 Maximum Distance: (cm) 3.4 Wound Description Classification: Full Thickness Without Exposed Support Structu Exudate Amount: Medium Exudate Type: Serosanguineous Exudate Color: red, brown res Foul Odor After Cleansing: No Slough/Fibrino No Wound Bed Granulation Amount: Large  (67-100%) Exposed Structure Granulation Quality: Red Fascia Exposed: No Necrotic Amount: None Present (0%) [name] Layer (Subcutaneous Tissue) Exposed: Yes Tendon Exposed: No Muscle Exposed: No Joint Exposed: No Bone Exposed: No Electronic Signature(s) Signed: 08/05/2022 2:32:03 PM By: Yevonne Pax RN Previous Signature: 07/01/2022 4:23:03 PM Version By: Yevonne Pax RN Entered By: Yevonne Pax on 08/05/2022 14:32:03 Cude, Dangela [name]. ([id number]) -------------------------------------------------------------------------------- Vitals Details Patient Name: Bhattacharyya, Amillia [name]. Date of Service: 07/01/2022 12:30 PM Medical Record Number: [id number] Patient Account Number: [id number] Date of Birth/Sex: December 23, 1961 (61 y.o. F) Treating RN: Yevonne Pax Primary Care Fadel Clason: [name] Other Clinician: Referring Sathvika Ojo: Card, John Treating Amilcar Reever/Extender: Rowan Blase in Treatment: 37 Vital Signs Time Taken: 12:38 Temperature (F): 98.5 Height (in): 58 Pulse (bpm): 84 Weight (lbs): 275 Respiratory Rate (breaths/min): 18 Body Mass Index (BMI): 57.5 Blood Pressure (mmHg): 150/90 Reference Range: 80 - 120 mg / dl Electronic Signature(s) Signed: 07/01/2022 4:23:03 PM By: Yevonne Pax RN Entered By: Yevonne Pax on 07/01/2022 12:39:16

## 2022-07-01 NOTE — Progress Notes (Signed)
[name], [name] ([id number]) Visit Report for 07/01/2022 Chief Complaint Document Details Patient Name: Jump, [name] Date of Service: 07/01/2022 12:30 PM Medical Record Number: [id number] Patient Account Number: [id number] Date of Birth/Sex: 10/26/1961 (61 y.o. F) Treating RN: Yevonne Pax Primary Care Provider: [name] Other Clinician: Referring Provider: Card, John Treating Provider/Extender: Rowan Blase in Treatment: 37 Information Obtained from: Patient Chief Complaint Surgical Back Ulcer Electronic Signature(s) Signed: 07/01/2022 2:33:09 PM By: [name] PA-C Entered By: [name] on 07/01/2022 14:33:09 [name], [name] ([id number]) -------------------------------------------------------------------------------- HPI Details Patient Name: [name], [name] Date of Service: 07/01/2022 12:30 PM Medical Record Number: [id number] Patient Account Number: [id number] Date of Birth/Sex: 07-Aug-1961 (61 y.o. 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 This was actually broken up into 3 separate surgical events. The initial surgical intervention actually was on November 05, 2021 almost a year ago. Subsequently the patient went back in February for a seroma of the area which unfortunately required her to have a repeat surgery to go in and clean this out. And then again this occurred in April where she went back in and again they felt like stitches were coming out and there was an additional seroma. She was placed in a wound VAC initially and then subsequently as it got smaller that was  discontinued. Again right now I will see anything that I think a wound VAC would help with. Nonetheless she definitely has a significant depth to the wound that is going require packing. I actually believe the Hydrofera Blue rope would probably do quite well with this the problem is as much as it is draining she probably needs this to be changed at least every day. She does not really have anyone that can help with that that is the complicating scenario here. With that being said the patient does have a history of multiple sclerosis, hypertension, and again this surgical wound dehiscence in regard to her lumbar spine region. She did have a repeat MRI which was actually completed 10/09/2021. This showed that there was no significant change in the subcutaneous fluid collection/track of the lower lumbar region. This is extending to the level of the fascia unfortunately. This seems to go all the way from the L2 level with a track extending all the way to the fascia at the L4-5 level. Again this is a significant wound and there is significant drainage but does not seem to communicate to the spinal region as far as spinal fluid or otherwise is concerned that is good news. Nonetheless she last saw Dr. Franky Macho who is her neurosurgeon on 10/01/2021 that was when he ordered this last MRI she supposed to see him next week as well. With that being said he did not feel like there was any significant issue there but was not sure why this was not healing that is when he ordered the MRI. They were wanting to make sure that this was packed appropriately by home health unfortunately the main issue currently is that home health is completely out of the picture as the patient has exhausted all the home health that that she gets for a year. She is now in a very difficult  predicament where she does not have anyone to help her change the dressing and to be honest that she is not able to do it herself with the location of the  wound being on the midline lumbar spine region. If she does not have anyone that can help it is probably can to be necessary for her to go to a facility for rehab and daily dressing changes as I feel like daily changes which is much drainage that she is having is going to be necessary. 10/23/2021 upon evaluation today patient appears to be doing decently well in regard to her back ulcer. This does seem to be draining a lot less than what it is been doing in the past. With that being said she still has quite a bit of drainage nonetheless. I do think that given time this should improve least I hope so. The good news is she does have home health coming out 3 days a week were doing it 2 days a week and she is paying someone we can to help. 10/30/2021 upon evaluation today patient appears to be doing okay in regard to her back ulcer this is not draining quite as bad as it was in the beginning but he still has quite a bit of drainage noted. I do believe that the patient would benefit from [place] going ahead forward with attempting a wound VAC using the Hydrofera Blue rope to pack with and then subsequently using the VAC externally to actually suction out and help this to fill- in. I think this is our ideal way to try to get things cleared at this point. As it stands I am not certain that we are really making a progress that we want to see near with doing it in the way we are which is packing with the rope. It is a good dressing but I do think it is insufficient for total healing. She just seems to have too much in the way of drainage at this point unfortunately. 11/06/2021 upon evaluation today patient unfortunately continues to have issues with her back ongoing. The good news is her MRI that was repeated showed signs of the size of this area in the lumbar spine region having decreased from 4 cm to 3.5 cm this is definitely not bad news at all. With that being said unfortunately she continues to have issues with  ongoing drainage not as severe as in the beginning but nonetheless still significant. I do think a wound VAC still would be a good way to go although her home health agency nurse apparently has some concerns about the possibility of not being able to keep a seal with this as they had struggles in the past. Nonetheless I explained to the patient that this is much different than what she had previous and that I really feel like it would do much better as far as getting the area taken care of without having any complications or issues here. I think that we should be able to maintain a seal. Nonetheless at this time I did discuss with the patient as well that she probably does need to have a wound VAC in order for [place] to get this moving in the right direction. 11/13/2021 upon evaluation patient's wound bed actually showed signs of significant drainage at this time. She did see the surgeon yesterday he did not see anything that appeared to be infected. Nonetheless he does appear that she is continuing to have areas here that just do not seem  to want to seal up there MRI findings have been negative but nonetheless she continues to have is the seroma that is filling in. I do feel like we need to try to widen the hole so we can get at least a half of the [facility] then this will be better than nothing at this point. 11/20/2021 upon evaluation today patient actually appears to be having less pain at this point which is good news and overall she we still do not have the results of the culture back yet it had to be sent out to [facility] and we do not have the result back yet. Is doing decently well in regard to her wound. Fortunately there does not appear to be any signs of active infection systemically nor locally at this time. 11/27/2021 upon evaluation today patient appears to be doing well with regard to her wound all things considered there does appear to be less drainage than there was previous.  Fortunately I do not see any evidence of worsening of the patient is stating that she is having some issues with back pain. This is somewhat new. Again this I think could be related to the fact that she is having some issues here with infection. We are still waiting to see what the result of her culture shows from susceptibility testing Enterococcus has been identified but we do not know if this is VRE or not. 12/04/2021 upon evaluation today patient appears to be doing well with regard to her wound all things considered. I did have a conversation with Erin from Dr. Sueanne Margarita office. Of note she notes that Dr. Drinda Butts really does not want to do anything surgical right now which I completely understand. With that being said I am still leery of how far we will make it getting this to heal short of any type of surgery to open this up and allow [place] to more appropriately packed the wound. Nonetheless I will absolutely give it our best shot as far as that is concerned. I discussed that with the patient today. She voiced understanding. The good news is the drainage today seems to be clear it is no longer cloudy as it was previous I am actually very pleased in that regard. SHAQUANTA, HARKLESS ([id number]) 12/11/2021 since have last seen the patient actually did have an conversation with Dr. Franky Macho with her neurosurgeon. Again this involve the discussion around whether or not to open the wound and try to apply a wound VAC following. With that being said the decision was made that that may be the best thing to do if the patient was in agreement. Nonetheless I am actually extremely encouraged with what I am seeing today much more than I would have thought. In fact I think that we may be over packing the wound which is why she is having some discomfort at this point as there is much less of the dressing able to get and this time compared to what we saw previous. That is actually really good news. In fact the 6:00  tunnel appears to have filled in is awesome news. At the 12:00 location I am actually able to pack into that area pretty effectively at this point today. In fact I was able to get less of the packing in which I will detail below. 12/18/2021 upon evaluation today patient appears to be doing well with regard to her wound on the back. Fortunately there is no signs of active infection which is great news and overall  very pleased with where things stand today. No fevers, chills, nausea, vomiting, or diarrhea. 01/01/2022 upon evaluation today patient appears to be doing decently well in regard to her wound. Fortunately there does not appear to be any signs of anything worsening which is great news. She still continues to have issues with drainage but this is not nearly as significant as what it was in the past. There is all clear drainage no signs of purulence noted. 01/08/2022 upon evaluation today patient appears to be doing well with regard to her wound. With that being said she tells me that she has been having some increased pain over the past week. It does appear based on the amount of Hydrofera Blue that we removed this is probably over pack. Again it is difficult to know exactly how old the nurses getting all the skin but nonetheless the length of Hydrofera Blue that was utilized was way too much which may be part of the reason why this felt so uncomfortable. Fortunately I think that we can cut back on that we discussed pieces smaller so hopefully that will not be over packed. 01/22/2021 upon evaluation today patient appears to be doing well With regard to there being no signs of infection at this time. The wound does still tunnel mainly up at the 12:00 location. The depth of the tunnel complete from entry point to the base is about 3.5 cm today. With that being said I do believe that overall she is making good progress this is just a very slow process for her which I know has been frustrating as  well. 01/29/2022 upon evaluation today patient appears to be doing well with regard to the wound on her lower back and the lumbar spine region. The good news is the depth that I got this week was a little bit less going up at the 12:00 location as compared to last week. I measured this to be 3.5 cm last week and it was 3.3 cm this week. Again that is a minute shift but nonetheless a good shift in the right direction. Overall I think that we are on the right track. 02/05/2022 upon evaluation today patient appears to be doing well with regard to her wound. In fact right now her measurements are somewhere around 2.9 cm which is definitely smaller and less deep than last week At the 12:00 location. Overall I am very pleased with what we are seeing. 02/19/2022 upon evaluation today patient unfortunately is noting that the wound is actually closing up externally but internally there is still some depth to this. I discussed with her today that we cannot let it close up like this is can end up being a significant issue for her if we allow for that. Subsequently we need to do what we can to try to open this up and keep it open more effectively. She voiced understanding. With that being said we are going to proceed with that procedure today in order to debride away some of the skin on externally that is trying to close and on this. 02/26/2022 upon evaluation patient appears to be doing better in my opinion after we had to open up this area last week. Again she has a significant amount of healing and overall I am extremely pleased with where things stand currently. I do not see any evidence of active infection locally nor systemically at this time which is great news and in general I think that we are on the right track for getting  this hopefully closed final and done. 03/05/2022 upon evaluation today patient unfortunately is doing a little bit worse with regard to the whole size this is starting to get much  smaller unfortunately. There does not appear to be any signs of active infection locally nor systemically which is great news. With that being said I am concerned about the fact that the patient is doing quite a bit worse with regard to trapping some fluid and almost feels like she may have a fluid pocket that not only goes in and up towards 12:00 but looks back around towards the more superficial subcutaneous tissue. I am very concerned that this is going to be something that is very hard for [place] to heal short of another surgery to open this up and try to wound VAC from the inside out. Even then there is no guarantees this is just a very difficult situation to be perfectly honest. 03/14/2022 upon evaluation today patient appears to be doing well with regard to her wound as far as the area staying open and pleased in that regard. With that being said unfortunately she is not doing as well when it comes to the irritation around it appears to be very inflamed and red this is not good that was discussed with her today as well. Subsequently I do believe that working to need to do what we can do to try and calm this down in the past she did well with the Augmentin due to Enterococcus infection I am not sure if were dealing with the same thing or not but at the same time I think that is probably to be my go to at this point. 03/19/2022 upon evaluation today patient appears to be doing really about the same in regard to her wound that the pain is better. 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 She seems to be improved dramatically with the antibiotic currently although it does not cover for MRSA I am apt to just see how this progresses over the next several days to a week. With that being said the bigger issue here is that we simply do not seem to be making excellent progress and I am concerned about the lack of progress. I discussed with Dr. Shon Baton with [facility] in [place]  and to be honest his recommendation was that the best bet would probably be to get her to a teaching center. She is actually been seen for rheumatology and a I believe psychologist/psychiatrist at [facility]. She would prefer to go that direction as opposed to [facility] or [facility]. I am definitely okay with that. 03/26/2022 upon evaluation today patient appears to be doing okay in regard to her wound. She is not showing signs of worsening and overall she tells me that her pain is not nearly as bad as it was previous. Fortunately I do not see any evidence of active infection locally or systemically which is great news. No fevers, chills, nausea, vomiting, or diarrhea. 04-02-2022 upon evaluation today patient appears to be doing well with regard to her wound. It does not appear to be showing any signs of worsening which is great news. With that being said you are still having some issues here with drainage although it is clear and mainly just tenderness with bleeding from the [facility] I do think that treating for the second issue which is MRSA is probably the right thing to do at this point she is now off of the Augmentin. 04-09-2022 upon  evaluation today patient's wound actually appears to be doing about the same. Fortunately I do not see any evidence of active infection locally or systemically at this time which is great news. No fevers, chills, nausea, vomiting, or diarrhea. 04-16-2022 upon evaluation today patient appears to be doing well with regard to her wound in the lumbar spine region. Subsequently she continues to have an area that does have a tunnel up into the 12:00 location. This is about 3.5 cm using a skinny probe curved upwards to get into this region. With that being said I really do not see any signs of overall improvement but also do not see any signs of worsening I feel like work on about a still made here to some degree. The patient did see her neurologist and he has referred her to  neurosurgery at [facility] for second ULYANA, HARDBARGER. ([id number]) opinion we will see what they have to say as well. 04-23-2022 upon evaluation today patient appears to be doing well with regard to her wound all things considered. She has been tolerating the dressing changes without complication. Fortunately I do not see any signs of active infection locally nor systemically which is great news. No fever chills noted 04-30-2022 upon evaluation today patient appears to be doing about the same in regard to her wound. The depth is really not dramatically improved as far as the 12:00 tunnel is concerned. The overall depth and the base of the wound does seem to be a little bit better it does sound like that external wound has closed as far as the opening is concerned we can have to cut this down from a half to a smaller size based on what we are seeing today. 05-07-2022 upon evaluation today patient's wound actually seems to be doing decently well. There is not a major shift here but a slight shift in the depth both straight and as well as in the [place] at 12:00. With that being said I again we will talk about a couple of millimeters but still every little bit can count when there are situations like this to be honest. 05-14-2022 upon evaluation today patient appears to be doing okay in regard to her wound. I do not see any signs of anything worsening. With that being said also do not see getting significantly better unfortunately. There does not appear to be any evidence of infection locally or systemically which is great news. No fevers, chills, nausea, vomiting, or diarrhea. 05-21-2022 upon evaluation today patient actually appears to be doing quite well in regard to her wound from the standpoint of there being no infection. With that being said there does not appear to be any evidence of infection locally nor systemically which is great news. No fevers, chills, nausea, vomiting, or diarrhea. 05-28-2022  upon evaluation today patient's wound actually appears to be doing about the same. I do not see any evidence of active infection locally or systemically which is great news. No fevers, chills, nausea, vomiting, or diarrhea. 06-04-2022 upon evaluation today patient appears to be doing well with regard to her wound. She has been tolerating dressing changes without complication. Fortunately I do not feel like there is any worsening also I do not feel like there is any improvement. I feel like right a solid area here where she has a tunnel that tracks up at 12:00 and then has a fluid pocket at around roughly 1:00 I can push on this and squeeze fluid out. Again I am not exactly sure  what else can be done from my standpoint to improve this. She does have an appointment with Dr. Christella Noa who I do believe is an excellent surgeon and this is the surgeon who performed this surgery initially. Again the biggest issue was following she had a lot of trouble with the wound VAC I do think that we need to try to see if she is can have a wound VAC following surgery what exactly regular need to do to help keep this moving in the right direction for her. Obviously I want her to have the best result possible that she has to go through surgery again to clean this area out. Absolutely willing to help out with getting this to heal. 06-13-2022 upon evaluation today patient appears to be doing well with regard to her wound its not any worse unfortunately it is also not significantly better which is the only issue currently. I do not see any signs of active infection locally or systemically which is great news. No fevers, chills, nausea, vomiting, or diarrhea. She does have her appointment on Monday with her neurosurgeon and we will see you Dr. Christella Noa has to say at that point. 06-18-2022 upon evaluation patient appears to be doing a little worse in regard to her wound only in the respect that has been several days since she has had  this changed in fact it was last changed on Friday. Since that time she tells me that she has kept this in place over the weekend and yesterday she was supposed to see her neurosurgeon yesterday but they had to cancel due to an emergency surgery according to what the patient tells me. Nonetheless she is having some issues here with drainage coming from the wound that is more purulent in nature I think this is something we have been seeing in the [facility] for several weeks but is more obvious being that it was not changed as much during the last week. She unfortunately is a little worried about this but again I think that this is just part of what we have been dealing with all long is more prevalent and obvious due to the length of time since it was changed last. She does have a repeat appointment for I believe July 10 she told me although she is going to see if she can find anything sooner. 06-25-2022 upon evaluation today patient's wound actually showed signs of marginal improvement which is good news. She still has the area of abscess that seems to be at roughly 2:00 when looking at her back. We can get into this with the skinny probe other than that I really do not know what else I can do to try to make this better. We discussed this and she does have an appointment with her neurosurgeon although that is not till mid July as he had to change it at the last moment during the time she was post to see him a week ago. She does have evidence of potential infection on culture I am going to go ahead and treat this in order to ensure that nothing worsens. With that being said I do believe that overall she seems to be doing well but I do think we want to make sure that that the knee gets worse in the meantime until she gets to see her neurosurgeon. 07-01-2022 upon evaluation today patient appears to continue to have trouble with her wound draining. Again we have been continue to monitor this and I still  see  the same issue that I have noted before the patient has an area which is draining seemingly from around the 2:00 location when you go up towards 12:00 and then over towards 2:00 looking at her back this is where there is actually a soft area external that if you push on it you will see fluid come out and subsequently this is also where it tracks to internally. Everything in the 6:00 location has completely healed in and this looks much better in that regard but this upper portion we just cannot get to closed with the methods that we have. It seemed to me that this is probably going to require that this area be open and cleaned out in order to allow it to heal and my suggestion would be that a wound VAC is probably going to be the ideal thing. Electronic Signature(s) Signed: 07/01/2022 2:34:05 PM By: [name] PA-C Entered By: [name] on 07/01/2022 14:34:05 Lepage, Domingo Pulse ([id number]) -------------------------------------------------------------------------------- Physical Exam Details Patient Name: [name], [name] Date of Service: 07/01/2022 12:30 PM Medical Record Number: [id number] Patient Account Number: [id number] Date of Birth/Sex: [birth year] (61 y.o. F) Treating RN: Yevonne Pax Primary Care Provider: [name] Other Clinician: Referring Provider: Card, John Treating Provider/Extender: Rowan Blase in Treatment: 37 Constitutional Obese and well-hydrated in no acute distress. Respiratory normal breathing without difficulty. Psychiatric this patient is able to make decisions and demonstrates good insight into disease process. Alert and Oriented x 3. pleasant and cooperative. Notes Upon inspection patient's measurements were really pretty much about the same the wound goes 3.5 cm up towards 12:00 and towards the right when looking at her back in the 2:00 location. That has led to this area continuing to have trouble completely closing. Obviously that is what  we are aiming for is getting that to saline but again I do not think our measures that were using currently are going to do that job and I am more afraid of this closing externally in the meantime. In fact that is my biggest concern. Electronic Signature(s) Signed: 07/01/2022 2:34:55 PM By: [name] PA-C Entered By: [name] on 07/01/2022 14:34:54 Suttles, Domingo Pulse ([id number]) -------------------------------------------------------------------------------- Physician Orders Details Patient Name: Bureau, Tayana C. Date of Service: 07/01/2022 12:30 PM Medical Record Number: [id number] Patient Account Number: [id number] Date of Birth/Sex: [birth year] (61 y.o. F) Treating RN: Yevonne Pax Primary Care Provider: [name] Other Clinician: Referring Provider: Card, John Treating Provider/Extender: Rowan Blase in Treatment: [phone] Verbal / Phone Orders: No Diagnosis Coding Follow-up Appointments o Return Appointment in 1 week. o Nurse Visit as needed Home Health o Home Health Company: [facility] o [facility] for wound care. May utilize formulary equivalent dressing for wound treatment orders unless otherwise specified. Home Health Nurse may visit PRN to address patientos wound care needs. - Monday and Friday [facility] fax [phone] Bathing/ Shower/ Hygiene o May shower; gently cleanse wound with antibacterial soap, rinse and pat dry prior to dressing wounds o No tub bath. Anesthetic (Use 'Patient Medications' Section for Anesthetic Order Entry) o Lidocaine applied to wound bed Edema Control - Lymphedema / Segmental Compressive Device / Other o Elevate, Exercise Daily and Avoid Standing for Long Periods of Time. o Elevate legs to the level of the heart and pump ankles as often as possible o Elevate leg(s) parallel to the floor when sitting. Additional Orders / Instructions o Follow Nutritious Diet and Increase Protein Intake Wound  Treatment Wound #1 - Back  Wound Laterality: Midline, Distal Cleanser: Normal Saline 1 x Per Day/30 Days Discharge Instructions: Wash your hands with soap and water. Remove old dressing, discard into plastic bag and place into trash. Cleanse the wound with Normal Saline prior to applying a clean dressing using gauze sponges, not tissues or cotton balls. Do not scrub or use excessive force. Pat dry using gauze sponges, not tissue or cotton balls. Primary Dressing: Hydrofera Blue Rope 1 x Per Day/30 Days Discharge Instructions: cut into fourths; angle the end Secondary Dressing: (SILICONE BORDER) Zetuvit Plus SILICONE BORDER Dressing 4x4 (in/in) 1 x Per Day/30 Days Electronic Signature(s) Signed: 07/01/2022 3:51:56 PM By: [name] PA-C Signed: 07/01/2022 4:23:03 PM By: Yevonne Pax RN Entered By: Yevonne Pax on 07/01/2022 12:41:59 [name], [name] ([id number]) -------------------------------------------------------------------------------- Problem List Details Patient Name: [name], [name] Date of Service: 07/01/2022 12:30 PM Medical Record Number: [id number] Patient Account Number: [id number] Date of Birth/Sex: 12-07-1961 (61 y.o. 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  ([id number]) -------------------------------------------------------------------------------- Progress Note Details Patient Name: Alling, Rudie C. Date of Service: 07/01/2022 12:30 PM Medical Record Number: [id number] Patient Account Number: [id number] Date of Birth/Sex: 1961-10-24 (61 y.o. F) Treating RN: Yevonne Pax Primary Care Provider: [name] Other Clinician: Referring Provider: Card, John Treating Provider/Extender: Rowan Blase in Treatment: 37 Subjective Chief Complaint Information obtained from Patient Surgical Back Ulcer History of Present Illness (HPI) 10/15/2021 upon evaluation today patient presents for initial evaluation here in the clinic concerning a surgical ulceration/dehiscence in the lumbar spine region following surgery that she had over the past year. This was actually broken up into 3 separate surgical events. The initial surgical intervention actually was on November 05, 2021 almost a year ago. Subsequently the patient went back in February for a seroma of the area which unfortunately required her to have a repeat surgery to go in and clean this out. And then again this occurred in April where she went back in and again they felt like stitches were coming out and there was an additional seroma. She was placed in a wound VAC initially and then subsequently as it got smaller that was discontinued. Again right now I will see anything that I think a wound VAC would help with. Nonetheless she definitely has a significant depth to the wound that is going require packing. I actually believe the Hydrofera Blue rope would probably do quite well with this the problem is as much as it is draining she probably needs this to be changed at least every day. She does not really have anyone that can help with that that is the complicating scenario here. With that being said the patient does have a history of multiple sclerosis, hypertension, and again this surgical wound  dehiscence in regard to her lumbar spine region. She did have a repeat MRI which was actually completed 10/09/2021. This showed that there was no significant change in the subcutaneous fluid collection/track of the lower lumbar  region. This is extending to the level of the fascia unfortunately. This seems to go all the way from the L2 level with a track extending all the way to the fascia at the L4-5 level. Again this is a significant wound and there is significant drainage but does not seem to communicate to the spinal region as far as spinal fluid or otherwise is concerned that is good news. Nonetheless she last saw Dr. Franky Macho who is her neurosurgeon on 10/01/2021 that was when he ordered this last MRI she supposed to see him next week as well. With that being said he did not feel like there was any significant issue there but was not sure why this was not healing that is when he ordered the MRI. They were wanting to make sure that this was packed appropriately by home health unfortunately the main issue currently is that home health is completely out of the picture as the patient has exhausted all the home health that that she gets for a year. She is now in a very difficult predicament where she does not have anyone to help her change the dressing and to be honest that she is not able to do it herself with the location of the wound being on the midline lumbar spine region. If she does not have anyone that can help it is probably can to be necessary for her to go to a facility for rehab and daily dressing changes as I feel like daily changes which is much drainage that she is having is going to be necessary. 10/23/2021 upon evaluation today patient appears to be doing decently well in regard to her back ulcer. This does seem to be draining a lot less than what it is been doing in the past. With that being said she still has quite a bit of drainage nonetheless. I do think that given time this  should improve least I hope so. The good news is she does have home health coming out 3 days a week were doing it 2 days a week and she is paying someone we can to help. 10/30/2021 upon evaluation today patient appears to be doing okay in regard to her back ulcer this is not draining quite as bad as it was in the beginning but he still has quite a bit of drainage noted. I do believe that the patient would benefit from [place] going ahead forward with attempting a wound VAC using the Hydrofera Blue rope to pack with and then subsequently using the VAC externally to actually suction out and help this to fill- in. I think this is our ideal way to try to get things cleared at this point. As it stands I am not certain that we are really making a progress that we want to see near with doing it in the way we are which is packing with the rope. It is a good dressing but I do think it is insufficient for total healing. She just seems to have too much in the way of drainage at this point unfortunately. 11/06/2021 upon evaluation today patient unfortunately continues to have issues with her back ongoing. The good news is her MRI that was repeated showed signs of the size of this area in the lumbar spine region having decreased from 4 cm to 3.5 cm this is definitely not bad news at all. With that being said unfortunately she continues to have issues with ongoing drainage not as severe as in the beginning but nonetheless still  significant. I do think a wound VAC still would be a good way to go although her home health agency nurse apparently has some concerns about the possibility of not being able to keep a seal with this as they had struggles in the past. Nonetheless I explained to the patient that this is much different than what she had previous and that I really feel like it would do much better as far as getting the area taken care of without having any complications or issues here. I think that we should be able  to maintain a seal. Nonetheless at this time I did discuss with the patient as well that she probably does need to have a wound VAC in order for [place] to get this moving in the right direction. 11/13/2021 upon evaluation patient's wound bed actually showed signs of significant drainage at this time. She did see the surgeon yesterday he did not see anything that appeared to be infected. Nonetheless he does appear that she is continuing to have areas here that just do not seem to want to seal up there MRI findings have been negative but nonetheless she continues to have is the seroma that is filling in. I do feel like we need to try to widen the hole so we can get at least a half of the [facility] then this will be better than nothing at this point. 11/20/2021 upon evaluation today patient actually appears to be having less pain at this point which is good news and overall she we still do not have the results of the culture back yet it had to be sent out to [facility] and we do not have the result back yet. Is doing decently well in regard to her wound. Fortunately there does not appear to be any signs of active infection systemically nor locally at this time. 11/27/2021 upon evaluation today patient appears to be doing well with regard to her wound all things considered there does appear to be less drainage than there was previous. Fortunately I do not see any evidence of worsening of the patient is stating that she is having some issues with back pain. This is somewhat new. Again this I think could be related to the fact that she is having some issues here with infection. We are still waiting to see what the result of her culture shows from susceptibility testing Enterococcus has been identified but we do not know if this is VRE or not. 12/04/2021 upon evaluation today patient appears to be doing well with regard to her wound all things considered. I did have a conversation with Erin from Dr. Sueanne Margarita  office. Of note she notes that Dr. Drinda Butts really does not want to do anything surgical right now which I completely Burnsed, Chrisanna C. ([id number]) understand. With that being said I am still leery of how far we will make it getting this to heal short of any type of surgery to open this up and allow [place] to more appropriately packed the wound. Nonetheless I will absolutely give it our best shot as far as that is concerned. I discussed that with the patient today. She voiced understanding. The good news is the drainage today seems to be clear it is no longer cloudy as it was previous I am actually very pleased in that regard. 12/11/2021 since have last seen the patient actually did have an conversation with Dr. Franky Macho with her neurosurgeon. Again this involve the discussion around whether or not to open the  wound and try to apply a wound VAC following. With that being said the decision was made that that may be the best thing to do if the patient was in agreement. Nonetheless I am actually extremely encouraged with what I am seeing today much more than I would have thought. In fact I think that we may be over packing the wound which is why she is having some discomfort at this point as there is much less of the dressing able to get and this time compared to what we saw previous. That is actually really good news. In fact the 6:00 tunnel appears to have filled in is awesome news. At the 12:00 location I am actually able to pack into that area pretty effectively at this point today. In fact I was able to get less of the packing in which I will detail below. 12/18/2021 upon evaluation today patient appears to be doing well with regard to her wound on the back. Fortunately there is no signs of active infection which is great news and overall very pleased with where things stand today. No fevers, chills, nausea, vomiting, or diarrhea. 01/01/2022 upon evaluation today patient appears to be doing decently well  in regard to her wound. Fortunately there does not appear to be any signs of anything worsening which is great news. She still continues to have issues with drainage but this is not nearly as significant as what it was in the past. There is all clear drainage no signs of purulence noted. 01/08/2022 upon evaluation today patient appears to be doing well with regard to her wound. With that being said she tells me that she has been having some increased pain over the past week. It does appear based on the amount of Hydrofera Blue that we removed this is probably over pack. Again it is difficult to know exactly how old the nurses getting all the skin but nonetheless the length of Hydrofera Blue that was utilized was way too much which may be part of the reason why this felt so uncomfortable. Fortunately I think that we can cut back on that we discussed pieces smaller so hopefully that will not be over packed. 01/22/2021 upon evaluation today patient appears to be doing well With regard to there being no signs of infection at this time. The wound does still tunnel mainly up at the 12:00 location. The depth of the tunnel complete from entry point to the base is about 3.5 cm today. With that being said I do believe that overall she is making good progress this is just a very slow process for her which I know has been frustrating as well. 01/29/2022 upon evaluation today patient appears to be doing well with regard to the wound on her lower back and the lumbar spine region. The good news is the depth that I got this week was a little bit less going up at the 12:00 location as compared to last week. I measured this to be 3.5 cm last week and it was 3.3 cm this week. Again that is a minute shift but nonetheless a good shift in the right direction. Overall I think that we are on the right track. 02/05/2022 upon evaluation today patient appears to be doing well with regard to her wound. In fact right now her  measurements are somewhere around 2.9 cm which is definitely smaller and less deep than last week At the 12:00 location. Overall I am very pleased with what we are seeing. 02/19/2022  upon evaluation today patient unfortunately is noting that the wound is actually closing up externally but internally there is still some depth to this. I discussed with her today that we cannot let it close up like this is can end up being a significant issue for her if we allow for that. Subsequently we need to do what we can to try to open this up and keep it open more effectively. She voiced understanding. With that being said we are going to proceed with that procedure today in order to debride away some of the skin on externally that is trying to close and on this. 02/26/2022 upon evaluation patient appears to be doing better in my opinion after we had to open up this area last week. Again she has a significant amount of healing and overall I am extremely pleased with where things stand currently. I do not see any evidence of active infection locally nor systemically at this time which is great news and in general I think that we are on the right track for getting this hopefully closed final and done. 03/05/2022 upon evaluation today patient unfortunately is doing a little bit worse with regard to the whole size this is starting to get much smaller unfortunately. There does not appear to be any signs of active infection locally nor systemically which is great news. With that being said I am concerned about the fact that the patient is doing quite a bit worse with regard to trapping some fluid and almost feels like she may have a fluid pocket that not only goes in and up towards 12:00 but looks back around towards the more superficial subcutaneous tissue. I am very concerned that this is going to be something that is very hard for [place] to heal short of another surgery to open this up and try to wound VAC from the  inside out. Even then there is no guarantees this is just a very difficult situation to be perfectly honest. 03/14/2022 upon evaluation today patient appears to be doing well with regard to her wound as far as the area staying open and pleased in that regard. With that being said unfortunately she is not doing as well when it comes to the irritation around it appears to be very inflamed and red this is not good that was discussed with her today as well. Subsequently I do believe that working to need to do what we can do to try and calm this down in the past she did well with the Augmentin due to Enterococcus infection I am not sure if were dealing with the same thing or not but at the same time I think that is probably to be my go to at this point. 03/19/2022 upon evaluation today patient appears to be doing really about the same in regard to her wound that the pain is better. 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 She seems to be improved dramatically with the antibiotic currently although it does not cover for MRSA I am apt to just see how this progresses over the next several days to a week. With that being said the bigger issue here is that we simply do not seem to be making excellent progress and I am concerned about the lack of progress. I discussed with Dr. Shon Baton with [facility] in [place] and to be honest his recommendation was that the best bet would probably be to get her to a teaching center. She is actually  been seen for rheumatology and a I believe psychologist/psychiatrist at [facility]. She would prefer to go that direction as opposed to [facility] or [facility]. I am definitely okay with that. 03/26/2022 upon evaluation today patient appears to be doing okay in regard to her wound. She is not showing signs of worsening and overall she tells me that her pain is not nearly as bad as it was previous. Fortunately I do not see any evidence of active infection locally or  systemically which is great news. No fevers, chills, nausea, vomiting, or diarrhea. 04-02-2022 upon evaluation today patient appears to be doing well with regard to her wound. It does not appear to be showing any signs of worsening which is great news. With that being said you are still having some issues here with drainage although it is clear and mainly just tenderness with bleeding from the [facility] I do think that treating for the second issue which is MRSA is probably the right thing to do at this point she is now off of the Augmentin. 04-09-2022 upon evaluation today patient's wound actually appears to be doing about the same. Fortunately I do not see any evidence of active infection locally or systemically at this time which is great news. No fevers, chills, nausea, vomiting, or diarrhea. MIKAYLA, CHIUSANO ([id number]) 04-16-2022 upon evaluation today patient appears to be doing well with regard to her wound in the lumbar spine region. Subsequently she continues to have an area that does have a tunnel up into the 12:00 location. This is about 3.5 cm using a skinny probe curved upwards to get into this region. With that being said I really do not see any signs of overall improvement but also do not see any signs of worsening I feel like work on about a still made here to some degree. The patient did see her neurologist and he has referred her to neurosurgery at [facility] for second opinion we will see what they have to say as well. 04-23-2022 upon evaluation today patient appears to be doing well with regard to her wound all things considered. She has been tolerating the dressing changes without complication. Fortunately I do not see any signs of active infection locally nor systemically which is great news. No fever chills noted 04-30-2022 upon evaluation today patient appears to be doing about the same in regard to her wound. The depth is really not dramatically improved as far as the 12:00  tunnel is concerned. The overall depth and the base of the wound does seem to be a little bit better it does sound like that external wound has closed as far as the opening is concerned we can have to cut this down from a half to a smaller size based on what we are seeing today. 05-07-2022 upon evaluation today patient's wound actually seems to be doing decently well. There is not a major shift here but a slight shift in the depth both straight and as well as in the [place] at 12:00. With that being said I again we will talk about a couple of millimeters but still every little bit can count when there are situations like this to be honest. 05-14-2022 upon evaluation today patient appears to be doing okay in regard to her wound. I do not see any signs of anything worsening. With that being said also do not see getting significantly better unfortunately. There does not appear to be any evidence of infection locally or systemically which is great news. No fevers,  chills, nausea, vomiting, or diarrhea. 05-21-2022 upon evaluation today patient actually appears to be doing quite well in regard to her wound from the standpoint of there being no infection. With that being said there does not appear to be any evidence of infection locally nor systemically which is great news. No fevers, chills, nausea, vomiting, or diarrhea. 05-28-2022 upon evaluation today patient's wound actually appears to be doing about the same. I do not see any evidence of active infection locally or systemically which is great news. No fevers, chills, nausea, vomiting, or diarrhea. 06-04-2022 upon evaluation today patient appears to be doing well with regard to her wound. She has been tolerating dressing changes without complication. Fortunately I do not feel like there is any worsening also I do not feel like there is any improvement. I feel like right a solid area here where she has a tunnel that tracks up at 12:00 and then has a fluid  pocket at around roughly 1:00 I can push on this and squeeze fluid out. Again I am not exactly sure what else can be done from my standpoint to improve this. She does have an appointment with Dr. Franky Macho who I do believe is an excellent surgeon and this is the surgeon who performed this surgery initially. Again the biggest issue was following she had a lot of trouble with the wound VAC I do think that we need to try to see if she is can have a wound VAC following surgery what exactly regular need to do to help keep this moving in the right direction for her. Obviously I want her to have the best result possible that she has to go through surgery again to clean this area out. Absolutely willing to help out with getting this to heal. 06-13-2022 upon evaluation today patient appears to be doing well with regard to her wound its not any worse unfortunately it is also not significantly better which is the only issue currently. I do not see any signs of active infection locally or systemically which is great news. No fevers, chills, nausea, vomiting, or diarrhea. She does have her appointment on Monday with her neurosurgeon and we will see you Dr. Franky Macho has to say at that point. 06-18-2022 upon evaluation patient appears to be doing a little worse in regard to her wound only in the respect that has been several days since she has had this changed in fact it was last changed on Friday. Since that time she tells me that she has kept this in place over the weekend and yesterday she was supposed to see her neurosurgeon yesterday but they had to cancel due to an emergency surgery according to what the patient tells me. Nonetheless she is having some issues here with drainage coming from the wound that is more purulent in nature I think this is something we have been seeing in the [facility] for several weeks but is more obvious being that it was not changed as much during the last week. She unfortunately is  a little worried about this but again I think that this is just part of what we have been dealing with all long is more prevalent and obvious due to the length of time since it was changed last. She does have a repeat appointment for I believe July 10 she told me although she is going to see if she can find anything sooner. 06-25-2022 upon evaluation today patient's wound actually showed signs of marginal improvement which is good  news. She still has the area of abscess that seems to be at roughly 2:00 when looking at her back. We can get into this with the skinny probe other than that I really do not know what else I can do to try to make this better. We discussed this and she does have an appointment with her neurosurgeon although that is not till mid July as he had to change it at the last moment during the time she was post to see him a week ago. She does have evidence of potential infection on culture I am going to go ahead and treat this in order to ensure that nothing worsens. With that being said I do believe that overall she seems to be doing well but I do think we want to make sure that that the knee gets worse in the meantime until she gets to see her neurosurgeon. 07-01-2022 upon evaluation today patient appears to continue to have trouble with her wound draining. Again we have been continue to monitor this and I still see the same issue that I have noted before the patient has an area which is draining seemingly from around the 2:00 location when you go up towards 12:00 and then over towards 2:00 looking at her back this is where there is actually a soft area external that if you push on it you will see fluid come out and subsequently this is also where it tracks to internally. Everything in the 6:00 location has completely healed in and this looks much better in that regard but this upper portion we just cannot get to closed with the methods that we have. It seemed to me that this is  probably going to require that this area be open and cleaned out in order to allow it to heal and my suggestion would be that a wound VAC is probably going to be the ideal thing. Ferrebee, Dasia C. ([id number]) Objective Constitutional Obese and well-hydrated in no acute distress. Vitals Time Taken: 12:38 PM, Height: 58 in, Weight: 275 lbs, BMI: 57.5, Temperature: 98.5 F, Pulse: 84 bpm, Respiratory Rate: 18 breaths/min, Blood Pressure: 150/90 mmHg. Respiratory normal breathing without difficulty. Psychiatric this patient is able to make decisions and demonstrates good insight into disease process. Alert and Oriented x 3. pleasant and cooperative. General Notes: Upon inspection patient's measurements were really pretty much about the same the wound goes 3.5 cm up towards 12:00 and towards the right when looking at her back in the 2:00 location. That has led to this area continuing to have trouble completely closing. Obviously that is what we are aiming for is getting that to saline but again I do not think our measures that were using currently are going to do that job and I am more afraid of this closing externally in the meantime. In fact that is my biggest concern. Integumentary (Hair, Skin) Wound #1 status is Open. Original cause of wound was Surgical Injury. The date acquired was: 04/11/2021. The wound has been in treatment 37 weeks. The wound is located on the Distal,Midline Back. The wound measures 0.3cm length x 0.3cm width x 2.6cm depth; 0.071cm^2 area and 0.184cm^3 volume. There is Fat Layer (Subcutaneous Tissue) exposed. There is no tunneling or undermining noted. There is a medium amount of serosanguineous drainage noted. There is large (67-100%) red granulation within the wound bed. There is no necrotic tissue within the wound bed. 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 Nurse Visit as needed Home Health: Home Health Company: - [facility] for wound care. May utilize formulary equivalent dressing for wound treatment orders unless otherwise specified. Home Health Nurse may visit PRN to address patient s wound care needs. - Monday and Friday [facility] fax [phone] Bathing/ Shower/ Hygiene: May shower; gently cleanse wound with antibacterial soap, rinse and pat dry prior to dressing wounds No tub bath. Anesthetic (Use 'Patient Medications' Section for Anesthetic Order Entry): Lidocaine applied to wound bed Edema Control - Lymphedema / Segmental Compressive Device / Other: Elevate, Exercise Daily and Avoid Standing for Long Periods of Time. Elevate legs to the level of the heart and pump ankles as often as possible Elevate leg(s) parallel to the floor when sitting. Additional Orders / Instructions: Follow Nutritious Diet and Increase Protein Intake WOUND #1: - Back Wound Laterality: Midline, Distal Cleanser: Normal Saline 1 x Per Day/30 Days Discharge Instructions: Wash your hands with soap and water. Remove old dressing, discard into plastic bag and place into trash. Cleanse the wound with Normal Saline prior to applying a clean dressing using gauze sponges, not tissues or cotton balls. Do not scrub or use excessive force. Pat dry using gauze sponges, not tissue or cotton balls. Primary Dressing: Hydrofera Blue Rope 1 x Per Day/30 Days Discharge Instructions: cut into fourths; angle the end Secondary Dressing: (SILICONE BORDER) Zetuvit Plus SILICONE BORDER Dressing 4x4 (in/in) 1 x Per Day/30 Days [name], [name] ([id number]) 1. I am good recommend currently that we go ahead and continue with the wound care measures as before and the patient is in agreement with plan for use of the  Hydrofera Blue rope which is threaded into the 12:00 location up around the 2:00 region where this abscess is. 2. I am to recommend as well that the patient should continue with the bordered foam dressing to cover which does catch the excess of drainage. 3. I am going to send a skinny bendable probe with the patient so that her neurosurgeon can also evaluate this and see what we are talking about with regard to the area of concern. She was appreciative of this as well. Subsequently we will see where things stand and follow-up she will be seeing him on Monday and me on Tuesday we will see what kind of plan they have going forward this may have include additional imaging I am unsure. We will see patient back for reevaluation in 1 week here in the clinic. If anything worsens or changes patient will contact our office for additional recommendations. Electronic Signature(s) Signed: 07/01/2022 2:36:04 PM By: [name] PA-C Entered By: [name] on 07/01/2022 14:36:04 Waddle, Domingo Pulse ([id number]) -------------------------------------------------------------------------------- SuperBill Details Patient Name: [name], [name] Date of Service: 07/01/2022 Medical Record Number: [id number] Patient Account Number: [id number] Date of Birth/Sex: Mar 05, 1961 (61 y.o. F) Treating RN: Yevonne Pax Primary Care Provider: [name] Other Clinician: Referring Provider: Card, John Treating Provider/Extender: Rowan Blase in Treatment: 37 Diagnosis Coding ICD-10 Codes Code Description T81.31XA Disruption of external operation (surgical) wound, not elsewhere classified, initial encounter 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 G35 Multiple sclerosis I10 Essential (primary) hypertension Facility Procedures CPT4 Code: 41660630 Description: 99213 - WOUND CARE VISIT-LEV 3 EST PT Modifier: Quantity: 1 Physician Procedures CPT4 Code: 1601093 Description: 99213 - WC  PHYS LEVEL 3 - EST  PT Modifier: Quantity: 1 CPT4 Code: Description: ICD-10 Diagnosis Description T81.31XA Disruption of external operation (surgical) wound, not elsewhere classifi 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 G35 Multiple sclerosis I10 Essential (primary) hypertension Modifier: ed, initial encounter Quantity: Electronic Signature(s) Signed: 07/01/2022 2:40:34 PM By: [name] PA-C Entered By: [name] on 07/01/2022 14:40:34

## 2022-07-03 ENCOUNTER — Ambulatory Visit: Payer: 59 | Attending: Geriatric Medicine | Admitting: Occupational Therapy

## 2022-07-03 DIAGNOSIS — I890 Lymphedema, not elsewhere classified: Secondary | ICD-10-CM | POA: Insufficient documentation

## 2022-07-03 NOTE — Therapy (Signed)
OUTPATIENT OCCUPATIONAL THERAPY TREATMENT NOTE   Patient Name: [name] MRN: [id number] DOB:December 24, 1961, 61 y.o., female Today's Date: 07/03/2022  PCP: Kennon Portela, MD REFERRING PROVIDER: Elisha Headland, FNP   OT End of Session - 07/03/22 1205     Visit Number 26    Number of Visits 36    Date for OT Re-Evaluation 08/12/22    OT Start Time 1105    OT Stop Time 1205    OT Time Calculation (min) 60 min    Activity Tolerance Patient tolerated treatment well;No increased pain    Behavior During Therapy WFL for tasks assessed/performed             Past Medical History:  Diagnosis Date   Arthritis    Back pain    Chronic knee pain    Edema of both lower extremities    High blood pressure    High cholesterol    Joint pain    Multiple sclerosis ([place])    Neuromuscular disorder ([facility])    Multiple Sclerosis over 20 years   Obesity    Vitamin D deficiency    Past Surgical History:  Procedure Laterality Date   APPENDECTOMY     CESAREAN SECTION     HAND SURGERY     lumbar back surgery     LUMBAR WOUND DEBRIDEMENT N/A 12/05/2020   Procedure: Lumbar Wound Exploration;  Surgeon: Ashok Pall, MD;  Location: [place];  Service: Neurosurgery;  Laterality: N/A;  3C/RM 21   LUMBAR WOUND DEBRIDEMENT N/A 02/21/2021   Procedure: Lumbar wound exploration;  Surgeon: Ashok Pall, MD;  Location: [place];  Service: Neurosurgery;  Laterality: N/A;  posterior   LUMBAR WOUND DEBRIDEMENT N/A 04/11/2021   Procedure: Wound Exploration with Wound Vac Placement;  Surgeon: Ashok Pall, MD;  Location: [place];  Service: Neurosurgery;  Laterality: N/A;   ROTATOR CUFF REPAIR     TONSILLECTOMY     Patient Active Problem List   Diagnosis Date Noted   Postoperative seroma of subcutaneous tissue after non-dermatologic procedure 04/11/2021   Wound drainage 02/21/2021   Wound dehiscence 02/21/2021   Postoperative seroma of musculoskeletal structure after musculoskeletal procedure 12/05/2020    Spondylolisthesis of lumbar region 11/02/2020   Insulin resistance 02/15/2020   Class 3 severe obesity with serious comorbidity and body mass index (BMI) of 45.0 to 49.9 in adult [facility]) 01/19/2020   Vitamin D deficiency 01/18/2020   Other hyperlipidemia 02/22/2019   Primary osteoarthritis of both knees 10/14/2018   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   Essential hypertension 02/16/2016   Multiple sclerosis ([place]) 04/06/2013    ONSET DATE: 02/14/22   REFERRING DIAG: I89.0 Lymphedema  THERAPY DIAG:  Lymphedema, not elsewhere classified  Rationale for Evaluation and Treatment Rehabilitation  PERTINENT HISTORY: B knee OA, HTN, Obesity, MS, Spondylolisthesis-lumbar, open wound at lumbar surgical site since 4/22.   LIMITATIONS: difficulty walking, impaired transfers, chronic OA pain in bilateral knees, non healing post surgical lumbar wound, BLE muscle weakness 2/2 MS, chronic leg swelling with minimal pain,   OUTCOME MEASURE: FOTO score on intake = 31/100%  PRECAUTIONS: Lymphedema precautions, Fall risk  SUBJECTIVE: [name] was last seen for OT Lymphedema care on 06/18/22. She missed 1 weekly session due to vacation trip to the beach. Pt presents with RLE custom compression stocking in place on a very swollen leg, and multilayer wraps on the LLE. Pt's LLE custom compression garment arrived     for fitting.   PAIN:  Are you having pain? No  OBJECTIVE:   TODAY'S TREATMENT:  Custom Compression garment fitting- LLE RLE/RLQ MLD as established Skin care throughout manual therapy to increase skin hydration and mobility and to limit infection risk   PATIENT EDUCATION: Education details: Continued Pt/ CG edu for lymphedema self care home program throughout session. Topics include outcome of comparative limb volumetrics- starting limb volume differentials (LVDs), technology and gradient techniques used for short stretch, multilayer compression wrapping, simple self-MLD, therapeutic lymphatic pumping  exercises, skin/nail care, LE precautions,. compression garment recommendations and specifications, wear and care schedule and compression garment donning / doffing w assistive devices. Discussed progress towards all OT goals since commencing CDT. All questions answered to the Pt's satisfaction. Good return. Person educated: Patient Education method: Customer service manager Education comprehension: verbalized understanding and returned demonstration   HOME EXERCISE PROGRAM Lymphatic pumping therex- BLE , 10 reps each item, in order, 1-2 x daily. MS-related muscle weakness limits Pt's ability to effectively perform therex on L     OT Long Term Goals - 05/20/22 1312       OT LONG TERM GOAL #1   Title Given this patient's Intake score 31 /100 on the functional outcomes FOTO tool, patient will experience an increase in function of 5 points to improve basic and instrumental ADLs performance, including lymphedema self-care.    Baseline 31/100    Period Weeks    Status On-going   TBA next visit   Target Date 08/18/22      OT LONG TERM GOAL #2   Title Pt will demonstrate understanding of lymphedema prevention strategies by identifying and discussing 5 precautions using printed reference (modified assistance) to reduce risk of progression and to limit infection risk.    Baseline Max A    Time 4    Period Days    Status Achieved    Target Date --      OT LONG TERM GOAL #3   Title With Maximum caregiver assistance Pt will be able to apply multilayer, LUE compression wraps using gradient techniques to decrease limb volume, to limit infection risk, and to limit lymphedema progression. Pt unable to wrap legs independently. Trained caregiver must assist during visit intervals for CDT to be effective.    Baseline dependent - Max caregiver assistance is necessary to complete Intensive Phase of Complete Decongestive Therapy and Lymphedema self-care home program  as Pt is unable to reach his feet  to apply compression wraps and garments, to inspect skin, to groom nails to perform skin care and inspection and to perform simple self-MLD. Pt agrees to arrange for consistent caregiver prior to commencing CDT.    Time 4    Period Days    Status Achieved   Met and exceeded. Pt is able to apply BLE multilayer compression wraps using correct gradient techniques with modified independence ( extra time).   Target Date --   4th OT Rx visit     OT LONG TERM GOAL #4   Title With Maximum caregiver assistance between OT sessions Pt will achieve at least a 10% limb volume reduction below the knee bilaterally to return limb to more normal size and shape, to limit infection risk, to decrease pain, to improve function, and to limit lymphedema progression.   Pt has achieved 8% voume reduction to date , which is excellent progress towards goals.   Baseline dependent    Time 12    Period Weeks    Status Partially Met   RLE A-D volume is reduced by 8.5% since  commencing CDT on 3/l/23. LLE is reduced in volume by 5.4% to date. Pt has achieved these reductions without CG assistance.   Target Date 08/18/22      OT LONG TERM GOAL #5   Title With caregiver assistance  Pt will achieve and sustain a least 85% compliance with all 4 LE self-care home program components throughout Intensive Phase CDT, including modified simple self-MLD, daily skin inspection and care, lymphatic pumping the ex, 23/7 compression wraps to optimize limb volume reductions, to limit lymphedema progression and to limit further functional decline.    Baseline Dependent    Time 12    Period Weeks    Status Achieved   Goal met and exceeded. Pt remains > 85% compliant with LE home program   with modified independence (extra time).   Target Date 05/16/22              Plan - 07/03/22 1209     Clinical Impression Statement  New,  custom, flat knit, RLE, Elvarex Classic, knee length , ccl 2 (23-32 mmHg)  compression garment fits very well. Pt  is able to don stocking independently. Pt will wear and wash before returning next to week to complete functional assessment . Fitting sheet faxed to DME vendor after session.  Provided RLE MLD and simultaneous skin care to as established. Pt requested using RLE gaRMENT AFTER mld INSTEAD OF APPLYING COMPRESSION WRAPS, STAING SHE PLANS TO ELEVATE HER LEGS A LOT MORE I expects increased swelling today is a results of reduced compression compliance while on vacation., but patient knows how to apply wraps to regain conmtrol PRN.  Pt returns next week, then we'll reduce frequency to follow-along in 6-8 weeks.   OT Occupational Profile and History Detailed Assessment- Review of Records and additional review of physical, cognitive, psychosocial history related to current functional performance    Occupational performance deficits (Please refer to evaluation for details): ADL's;IADL's;Rest and Sleep;Leisure;Social Participation;Work    Writer Several treatment options, min-mod task modification necessary    Comorbidities Affecting Occupational Performance: May have comorbidities impacting occupational performance    Modification or Assistance to Complete Evaluation  Min-Moderate modification of tasks or assist with assess necessary to complete eval    OT Frequency 2x / week    OT Duration 12 weeks    OT Treatment/Interventions Self-care/ADL training;DME and/or AE instruction;Manual lymph drainage;Compression bandaging;Therapeutic activities;Coping strategies training;Therapeutic exercise;Other (comment);Manual Therapy;Energy conservation;Patient/family education   skin care, Flexitouch trial, fit with appropriate compression garments that are comfortable, effective and that Pt is able to don and doff using assistive devices PRN   Recommended Other Services " Calcium channel blockers may not directly impair lymphatic function. However our results show that a reduced  lymphatic function predisposes to CBC edema, which may explain why some patients develop edema during treatment." [employer], Niklas Telinius, [employer] Billings, and Vibeke Hjortdal.  Reduced Lymphatic Function Predisposes to Calcium Channel Blocker Edema: A Randomized Placebo-Controlled Clinical Trial.  Lymphatic Research and Biology.Apr [email or web address]://[email or web address]  Published in Volume: 18 Issue 2: April 16, 2019  Online Ahead of Print:August 18, 2018    Consulted and Agree with Plan of Care Patient             Andrey Spearman, MS, OTR/L, [facility] 07/03/22 12:59 PM

## 2022-07-04 DIAGNOSIS — L98422 Non-pressure chronic ulcer of back with fat layer exposed: Secondary | ICD-10-CM | POA: Diagnosis not present

## 2022-07-04 NOTE — Progress Notes (Addendum)
[name], [name] ([id number]) Visit Report for 07/04/2022 Arrival Information Details Patient Name: [name], [name] Date of Service: 07/04/2022 3:30 PM Medical Record Number: [id number] Patient Account Number: [id number] Date of Birth/Sex: 1961/04/30 (61 y.o. F) Treating RN: Yevonne Pax Primary Care Quasim Doyon: Card, Jonny Ruiz Other Clinician: Referring Delight Bickle: Card, John Treating Karia Ehresman/Extender: Rowan Blase in Treatment: 37 Visit Information History Since Last Visit All ordered tests and consults were completed: No Patient Arrived: [name] Added or deleted any medications: No Arrival Time: 15:40 Any new allergies or adverse reactions: No Accompanied By: self Had a fall or experienced change in No Transfer Assistance: None activities of daily living that may affect Patient Identification Verified: Yes risk of falls: Secondary Verification Process Completed: Yes Signs or symptoms of abuse/neglect since last visito No Patient Requires Transmission-Based Precautions: No Hospitalized since last visit: No Patient Has Alerts: No Implantable device outside of the clinic excluding No cellular tissue based products placed in the center since last visit: Has Dressing in Place as Prescribed: Yes Pain Present Now: No Electronic Signature(s) Signed: 07/04/2022 4:25:30 PM By: Yevonne Pax RN Entered By: Yevonne Pax on 07/04/2022 16:25:29 [name], [name] ([id number]) -------------------------------------------------------------------------------- Clinic Level of Care Assessment Details Patient Name: Oliveto, [name] Date of Service: 07/04/2022 3:30 PM Medical Record Number: [id number] Patient Account Number: [id number] Date of Birth/Sex: January 04, 1961 (61 y.o. F) Treating RN: Yevonne Pax Primary Care Duran Ohern: Card, Jonny Ruiz Other Clinician: Referring Raneen Jaffer: Card, John Treating Coyt Govoni/Extender: Rowan Blase in Treatment: 37 Clinic Level of Care Assessment Items TOOL 4  Quantity Score X - Use when only an EandM is performed on FOLLOW-UP visit 1 0 ASSESSMENTS - Nursing Assessment / Reassessment X - Reassessment of Co-morbidities (includes updates in patient status) 1 10 X- 1 5 Reassessment of Adherence to Treatment Plan ASSESSMENTS - Wound and Skin Assessment / Reassessment X - Simple Wound Assessment / Reassessment - one wound 1 5 []  - 0 Complex Wound Assessment / Reassessment - multiple wounds []  - 0 Dermatologic / Skin Assessment (not related to wound area) ASSESSMENTS - Focused Assessment []  - Circumferential Edema Measurements - multi extremities 0 []  - 0 Nutritional Assessment / Counseling / Intervention []  - 0 Lower Extremity Assessment (monofilament, tuning fork, pulses) []  - 0 Peripheral Arterial Disease Assessment (using hand held doppler) ASSESSMENTS - Ostomy and/or Continence Assessment and Care []  - Incontinence Assessment and Management 0 []  - 0 Ostomy Care Assessment and Management (repouching, etc.) PROCESS - Coordination of Care X - Simple Patient / Family Education for ongoing care 1 15 []  - 0 Complex (extensive) Patient / Family Education for ongoing care []  - 0 Staff obtains , Records, Test Results / Process Orders []  - 0 Staff telephones HHA, Nursing Homes / Clarify orders / etc []  - 0 Routine Transfer to another Facility (non-emergent condition) []  - 0 Routine Hospital Admission (non-emergent condition) []  - 0 New Admissions / / Ordering NPWT, Apligraf, etc. []  - 0 Emergency Hospital Admission (emergent condition) X- 1 10 Simple Discharge Coordination []  - 0 Complex (extensive) Discharge Coordination PROCESS - Special Needs []  - Pediatric / Minor Patient Management 0 []  - 0 Isolation Patient Management []  - 0 Hearing / Language / Visual special needs []  - 0 Assessment of Community assistance (transportation, D/C planning, etc.) []  - 0 Additional assistance / Altered  mentation []  - 0 Support Surface(s) Assessment (bed, cushion, seat, etc.) INTERVENTIONS - Wound Cleansing / Measurement [name], [name] ( ) X- 1 5 Simple Wound Cleansing -  one wound []  - 0 Complex Wound Cleansing - multiple wounds X- 1 5 Wound Imaging (photographs - any number of wounds) []  - 0 Wound Tracing (instead of photographs) X- 1 5 Simple Wound Measurement - one wound []  - 0 Complex Wound Measurement - multiple wounds INTERVENTIONS - Wound Dressings []  - Small Wound Dressing one or multiple wounds 0 X- 1 15 Medium Wound Dressing one or multiple wounds []  - 0 Large Wound Dressing one or multiple wounds []  - 0 Application of Medications - topical []  - 0 Application of Medications - injection INTERVENTIONS - Miscellaneous []  - External ear exam 0 []  - 0 Specimen Collection (cultures, biopsies, blood, body fluids, etc.) []  - 0 Specimen(s) / Culture(s) sent or taken to Lab for analysis []  - 0 Patient Transfer (multiple staff / / Similar devices) []  - 0 Simple Staple / Suture removal (25 or less) []  - 0 Complex Staple / Suture removal (26 or more) []  - 0 Hypo / Hyperglycemic Management (close monitor of Blood Glucose) []  - 0 Ankle / Brachial Index (ABI) - do not check if billed separately X- 1 5 Vital Signs Has the patient been seen at the hospital within the last three years: Yes Total Score: 80 Level Of Care: New/Established - Level 3 Electronic Signature(s) Unsigned Entered By on 07/04/2022 16:54:21 Signature(s): Date(s): [name], [name] C ( ) -------------------------------------------------------------------------------- Encounter Discharge Information Details Patient Name: [name], [name] Date of Service: 07/04/2022 3:30 PM Medical Record Number: Patient Account Number: Date of Birth/Sex: [birth year] (61 y.o. F) Treating RN: Primary Care Osvaldo Lamping: Nurse, adult Other  Clinician: Referring Krithi Bray: Card, John Treating Yvonne Stopher/Extender: in Treatment: 37 Encounter Discharge Information Items Discharge Condition: Stable Ambulatory Status: Walker Discharge Destination: Home Transportation: Private Auto Accompanied By: self Schedule Follow-up Appointment: Yes Clinical Summary of Care: Electronic Signature(s) Signed: 07/04/2022 4:53:27 PM By: RN Entered By: Yevonne Pax on 07/04/2022 16:53:26 [name], [name] (Marland Kitchen) -------------------------------------------------------------------------------- Wound Assessment Details Patient Name: [name], [name] Date of Service: 07/04/2022 3:30 PM Medical Record Number: 09/04/2022 Patient Account Number: [id number] Date of Birth/Sex: Feb 12, 1961 (61 y.o. F) Treating RN: 68 Primary Care Khira Cudmore: Card, Yevonne Pax Other Clinician: Referring Yasmin Dibello: Card, John Treating Harriett Azar/Extender: [name] in Treatment: 37 Wound Status Wound Number: 1 Primary Etiology: Dehisced Wound Wound Location: Distal, Midline Back Wound Status: Open Wounding Event: Surgical Injury Comorbid History: Hypertension Date Acquired: 04/11/2021 Weeks Of Treatment: 37 Clustered Wound: No Wound Measurements Length: (cm) 0.3 Width: (cm) 0.3 Depth: (cm) 2.6 Area: (cm) 0.071 Volume: (cm) 0.184 % Reduction in Area: 43.7% % Reduction in Volume: 68.2% Epithelialization: None Tunneling: No Undermining: Yes Starting Position (o'clock): 9 Ending Position (o'clock): 3 Maximum Distance: (cm) 3.5 Wound Description Classification: Full Thickness Without Exposed Support Structu Exudate Amount: Medium Exudate Type: Serosanguineous Exudate Color: red, brown res Foul Odor After Cleansing: No Slough/Fibrino No Wound Bed Granulation Amount: Large (67-100%) Exposed Structure Granulation Quality: Red Fascia Exposed: No Necrotic Amount: None Present (0%) Fat Layer (Subcutaneous Tissue)  Exposed: Yes Tendon Exposed: No Muscle Exposed: No Joint Exposed: No Bone Exposed: No Electronic Signature(s) Signed: 08/05/2022 2:31:25 PM By: 09/04/2022 RN Previous Signature: 07/04/2022 4:25:56 PM Version By: Yevonne Pax RN Entered By: 09/04/2022 on 08/05/2022 14:31:25

## 2022-07-04 NOTE — Progress Notes (Signed)
NAPHTALI, RIEDE ([id number]) Visit Report for 07/04/2022 Physician Orders Details Patient Name: [name], [name] Date of Service: 07/04/2022 3:30 PM Medical Record Number: [id number] Patient Account Number: [id number] Date of Birth/Sex: Jan 18, 1961 (61 y.o. F) Treating RN: Yevonne Pax Primary Care Provider: [name] Other Clinician: Referring Provider: Card, John Treating Provider/Extender: Rowan Blase in Treatment: [phone] Verbal / Phone Orders: No Diagnosis Coding Follow-up Appointments o Return Appointment in 1 week. o Nurse Visit as needed Home Health o [facility]: [facility] o [facility] for wound care. May utilize formulary equivalent dressing for wound treatment orders unless otherwise specified. Home Health Nurse may visit PRN to address patientos wound care needs. - Monday and Friday [facility] fax [phone] Bathing/ Shower/ Hygiene o May shower; gently cleanse wound with antibacterial soap, rinse and pat dry prior to dressing wounds o No tub bath. Anesthetic (Use 'Patient Medications' Section for Anesthetic Order Entry) o Lidocaine applied to wound bed Edema Control - Lymphedema / Segmental Compressive Device / Other o Elevate, Exercise Daily and Avoid Standing for Long Periods of Time. o Elevate legs to the level of the heart and pump ankles as often as possible o Elevate leg(s) parallel to the floor when sitting. Additional Orders / Instructions o Follow Nutritious Diet and Increase Protein Intake Wound Treatment Wound #1 - Back Wound Laterality: Midline, Distal Cleanser: Normal Saline 1 x Per Day/30 Days Discharge Instructions: Wash your hands with soap and water. Remove old dressing, discard into plastic bag and place into trash. Cleanse the wound with Normal Saline prior to applying a clean dressing using gauze sponges, not tissues or cotton balls. Do not scrub or use excessive force. Pat dry using gauze sponges, not tissue  or cotton balls. Primary Dressing: Hydrofera Blue Rope 1 x Per Day/30 Days Discharge Instructions: cut into fourths; angle the end Secondary Dressing: (SILICONE BORDER) Zetuvit Plus SILICONE BORDER Dressing 4x4 (in/in) 1 x Per Day/30 Days Electronic Signature(s) Signed: 07/04/2022 4:26:30 PM By: Yevonne Pax RN Signed: 07/04/2022 4:56:25 PM By: [name] PA-C Entered By: Yevonne Pax on 07/04/2022 16:26:29 Lauf, Laurie C. ([id number]) -------------------------------------------------------------------------------- SuperBill Details Patient Name: Delany, Latoy C. Date of Service: 07/04/2022 Medical Record Number: [id number] Patient Account Number: [id number] Date of Birth/Sex: July 04, 1961 (61 y.o. 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

## 2022-07-05 ENCOUNTER — Encounter: Payer: 59 | Admitting: Occupational Therapy

## 2022-07-09 ENCOUNTER — Ambulatory Visit: Payer: 59 | Admitting: Physical Therapy

## 2022-07-09 ENCOUNTER — Ambulatory Visit: Payer: 59 | Admitting: Occupational Therapy

## 2022-07-09 ENCOUNTER — Encounter: Payer: 59 | Admitting: Physician Assistant

## 2022-07-09 DIAGNOSIS — L98422 Non-pressure chronic ulcer of back with fat layer exposed: Secondary | ICD-10-CM | POA: Diagnosis not present

## 2022-07-09 NOTE — Progress Notes (Signed)
ANETRA, CZERWINSKI ([id number]) Visit Report for 07/09/2022 Chief Complaint Document Details Patient Name: [name], [name] Date of Service: 07/09/2022 1:15 PM Medical Record Number: [id number] Patient Account Number: [id number] Date of Birth/Sex: 1961/08/30 (61 y.o. F) Treating RN: Yevonne Pax Primary Care Provider: [name] Other Clinician: Referring Provider: Card, John Treating Provider/Extender: Rowan Blase in Treatment: 43 Information Obtained from: Patient Chief Complaint Surgical Back Ulcer Electronic Signature(s) Signed: 07/09/2022 1:19:18 PM By: [name] PA-C Entered By: [name] on 07/09/2022 13:19:18 [name], [name] ([id number]) -------------------------------------------------------------------------------- Physician Orders Details Patient Name: [name], [name] Date of Service: 07/09/2022 1:15 PM Medical Record Number: [id number] Patient Account Number: [id number] Date of Birth/Sex: 11/29/1961 (61 y.o. 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 o Nurse Visit as needed Home Health o [facility]: [facility] o [facility] for wound care. May utilize formulary equivalent dressing for wound treatment orders unless otherwise specified. Home Health Nurse may visit PRN to address patientos wound care needs. - Monday and Friday [facility] fax [phone] Bathing/ Shower/ Hygiene o May shower; gently cleanse wound with antibacterial soap,  rinse and pat dry prior to dressing wounds o No tub bath. Anesthetic (Use 'Patient Medications' Section for Anesthetic Order Entry) o Lidocaine applied to wound bed Edema Control - Lymphedema / Segmental Compressive Device / Other o Elevate, Exercise Daily and Avoid Standing for Long Periods of Time. o Elevate legs to the level of the heart and pump ankles as often as possible o Elevate leg(s) parallel to the floor when sitting. Additional Orders / Instructions o Follow Nutritious Diet and Increase Protein Intake Wound Treatment Wound #1 - Back Wound Laterality: Midline, Distal Cleanser: Normal Saline 1 x Per Day/30 Days Discharge Instructions: Wash your hands with soap and water. Remove old dressing, discard into plastic bag and place into trash. Cleanse the wound with Normal Saline prior to applying a clean dressing using gauze sponges, not tissues or cotton balls. Do not scrub or use excessive force. Pat dry using gauze sponges, not tissue or cotton balls. Primary Dressing: Hydrofera Blue Rope 1 x Per Day/30 Days Discharge Instructions: cut into fourths; angle the end Secondary Dressing: (SILICONE BORDER) Zetuvit Plus SILICONE BORDER Dressing 4x4 (in/in) 1 x Per Day/30 Days Electronic Signature(s) Signed: 07/09/2022 4:10:14 PM By: Yevonne Pax RN Entered By: Yevonne Pax on 07/09/2022 16:10:14 [name], [name] ([id number]) -------------------------------------------------------------------------------- Problem List Details Patient Name: [name], [name] Date of Service: 07/09/2022 1:15 PM Medical Record Number: [id number] Patient Account Number: [id number] Date of Birth/Sex: 1961/09/23 (61 y.o. 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 ([id number]) -------------------------------------------------------------------------------- SuperBill Details Patient Name: [name], [name] Date of Service: 07/09/2022 Medical Record Number: [id number] Patient Account Number: [id number] Date of Birth/Sex: 26-Mar-1961 (61 y.o. 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

## 2022-07-11 DIAGNOSIS — L98422 Non-pressure chronic ulcer of back with fat layer exposed: Secondary | ICD-10-CM | POA: Diagnosis not present

## 2022-07-12 NOTE — Progress Notes (Addendum)
[name], [name] ([id number]) Visit Report for 07/09/2022 Arrival Information Details Patient Name: [name], [name] Date of Service: 07/09/2022 1:15 PM Medical Record Number: [id number] Patient Account Number: [id number] Date of Birth/Sex: [birth year] (61 y.o. F) Treating RN: [name] Primary Care [name]: [name], [name] Other Clinician: Referring [name]: [name], [name] Treating [name]/Extender: [name] in Treatment: 27 Visit Information History Since Last Visit All ordered tests and consults were completed: No Patient Arrived: Ambulatory Added or deleted any medications: No Arrival Time: 13:12 Any new allergies or adverse reactions: No Accompanied By: self Had a fall or experienced change in No Transfer Assistance: None activities of daily living that may affect Patient Identification Verified: Yes risk of falls: Secondary Verification Process Completed: Yes Signs or symptoms of abuse/neglect since last visito No Patient Requires Transmission-Based Precautions: No Hospitalized since last visit: No Patient Has Alerts: No Implantable device outside of the clinic excluding No cellular tissue based products placed in the center since last visit: Has Dressing in Place as Prescribed: Yes Pain Present Now: Yes Electronic Signature(s) Signed: 07/12/2022 10:45:13 AM By: [name] Pax RN Entered By: [name] on 07/09/2022 13:18:07 [name], [name] ([id number]) -------------------------------------------------------------------------------- Clinic Level of Care Assessment Details Patient Name: [name], [name] Date of Service: 07/09/2022 1:15 PM Medical Record Number: [id number] Patient Account Number: [id number] Date of Birth/Sex: [birth year] (61 y.o. F) Treating RN: [name] Primary Care [name]: [name] Other Clinician: Referring Meyli Boice: [name], [name] Treating [name]/Extender: [name] in Treatment: 38 Clinic Level of Care Assessment  Items TOOL 1 Quantity Score []  - Use when EandM and Procedure is performed on INITIAL visit 0 ASSESSMENTS - Nursing Assessment / Reassessment []  - General Physical Exam (combine w/ comprehensive assessment (listed just below) when performed on new 0 pt. evals) []  - 0 Comprehensive Assessment (HX, [name], Risk Assessments, Wounds Hx, etc.) ASSESSMENTS - Wound and Skin Assessment / Reassessment []  - Dermatologic / Skin Assessment (not related to wound area) 0 ASSESSMENTS - Ostomy and/or Continence Assessment and Care []  - Incontinence Assessment and Management 0 []  - 0 Ostomy Care Assessment and Management (repouching, etc.) PROCESS - Coordination of Care []  - Simple Patient / Family Education for ongoing care 0 []  - 0 Complex (extensive) Patient / Family Education for ongoing care []  - 0 Staff obtains , Records, Test Results / Process Orders []  - 0 Staff telephones HHA, Nursing Homes / Clarify orders / etc []  - 0 Routine Transfer to another Facility (non-emergent condition) []  - 0 Routine Hospital Admission (non-emergent condition) []  - 0 New Admissions / / Ordering NPWT, Apligraf, etc. []  - 0 Emergency Hospital Admission (emergent condition) PROCESS - Special Needs []  - Pediatric / Minor Patient Management 0 []  - 0 Isolation Patient Management []  - 0 Hearing / Language / Visual special needs []  - 0 Assessment of Community assistance (transportation, D/[name] planning, etc.) []  - 0 Additional assistance / Altered mentation []  - 0 Support Surface(s) Assessment (bed, cushion, seat, etc.) INTERVENTIONS - Miscellaneous []  - External ear exam 0 []  - 0 Patient Transfer (multiple staff / / Similar devices) []  - 0 Simple Staple / Suture removal (25 or less) []  - 0 Complex Staple / Suture removal (26 or more) []  - 0 Hypo/Hyperglycemic Management (do not check if billed separately) []  - 0 Ankle / Brachial Index (ABI) - do not check if  billed separately Has the patient been seen at the hospital within the last three years: Yes Total Score: 0 Level  Of Care: ____ [name] ([id number]) Electronic Signature(s) Signed: 07/12/2022 10:45:13 AM By: [name] Pax RN Entered By: [name] on 07/09/2022 14:33:17 [name], [name] ([id number]) -------------------------------------------------------------------------------- Encounter Discharge Information Details Patient Name: [name], [name] Date of Service: 07/09/2022 1:15 PM Medical Record Number: [id number] Patient Account Number: [id number] Date of Birth/Sex: [birth year] (61 y.o. F) Treating RN: [name] Primary Care Kiwanna Spraker: [name] Other Clinician: Referring [name]: [name], [name] Treating [name]/Extender: [name] in Treatment: 33 Encounter Discharge Information Items Discharge Condition: Stable Ambulatory Status: Walker Discharge Destination: Home Transportation: Private Auto Accompanied By: daughter Schedule Follow-up Appointment: Yes Clinical Summary of Care: Electronic Signature(s) Signed: 07/12/2022 10:45:13 AM By: [name] Pax RN Entered By: [name] on 07/09/2022 14:01:54 [name], [name] ([id number]) -------------------------------------------------------------------------------- Lower Extremity Assessment Details Patient Name: [name], [name] Date of Service: 07/09/2022 1:15 PM Medical Record Number: [id number] Patient Account Number: [id number] Date of Birth/Sex: 13-Nov-1961 (61 y.o. F) Treating RN: [name] Primary Care [name]: [name] Other Clinician: Referring [name]: [name], [name] Treating [name]/Extender: [name] in Treatment: 37 Electronic Signature(s) Signed: 07/09/2022 1:26:40 PM By: [name] Pax RN Entered By: [name] on 07/09/2022 13:26:40 [name], [name] ([id number]) -------------------------------------------------------------------------------- Multi Wound Chart  Details Patient Name: [name], [name] Date of Service: 07/09/2022 1:15 PM Medical Record Number: [id number] Patient Account Number: [id number] Date of Birth/Sex: 05/21/1961 (61 y.o. F) Treating RN: [name] Primary Care Jaymeson Mengel: [name] Other Clinician: Referring Saben Donigan: [name], [name] Treating Aliza Moret/Extender: [name] in Treatment: 38 Vital Signs Height(in): 58 Pulse(bpm): 85 Weight(lbs): 275 Blood Pressure(mmHg): 134/77 Body Mass Index(BMI): 57.5 Temperature(F): 98.2 Respiratory Rate(breaths/min): 18 Wound Assessments Treatment Notes Electronic Signature(s) Signed: 07/09/2022 1:27:05 PM By: [name] Pax RN Entered By: [name] on 07/09/2022 13:27:05 Tiberio, [name] ([id number]) -------------------------------------------------------------------------------- Multi-Disciplinary Care Plan Details Patient Name: Schadler, [name] Date of Service: 07/09/2022 1:15 PM Medical Record Number: [id number] Patient Account Number: [id number] Date of Birth/Sex: January 21, 1961 (61 y.o. F) Treating RN: [name] Primary Care Shlome Baldree: [name] Other Clinician: Referring Thaila Bottoms: [name], [name] Treating Kimberle Stanfill/Extender: [name] in Treatment: 83 Active Inactive Wound/Skin Impairment Nursing Diagnoses: Knowledge deficit related to ulceration/compromised skin integrity Goals: Patient/caregiver will verbalize understanding of skin care regimen Date Initiated: 10/15/2021 Target Resolution Date: 08/09/2022 Goal Status: Active Ulcer/skin breakdown will have a volume reduction of 30% by week 4 Date Initiated: 10/15/2021 Date Inactivated: 01/29/2022 Target Resolution Date: 12/15/2021 Goal Status: Unmet Unmet Reason: comorbities Ulcer/skin breakdown will have a volume reduction of 50% by week 8 Date Initiated: 10/15/2021 Date Inactivated: 01/29/2022 Target Resolution Date: 01/15/2022 Goal Status: Unmet Unmet Reason: comorbities Ulcer/skin breakdown will  have a volume reduction of 80% by week 12 Date Initiated: 10/15/2021 Date Inactivated: 02/19/2022 Target Resolution Date: 02/15/2022 Goal Status: Unmet Unmet Reason: comorbities Ulcer/skin breakdown will heal within 14 weeks Date Initiated: 10/15/2021 Date Inactivated: 05/07/2022 Target Resolution Date: 03/15/2022 Goal Status: Unmet Unmet Reason: comorbities Interventions: Assess patient/caregiver ability to obtain necessary supplies Assess patient/caregiver ability to perform ulcer/skin care regimen upon admission and as needed Assess ulceration(s) every visit Notes: Electronic Signature(s) Signed: 07/09/2022 1:26:57 PM By: [name] Pax RN Entered By: [name] on 07/09/2022 13:26:57 Hussein, [name] ([id number]) -------------------------------------------------------------------------------- Pain Assessment Details Patient Name: Lhommedieu, [name] Date of Service: 07/09/2022 1:15 PM Medical Record Number: [id number] Patient Account Number: [id number] Date of Birth/Sex: 06/04/1961 (61 y.o. F) Treating RN: [name] Primary Care Sharmain Lastra: [name] Other Clinician: Referring Zyrell Carmean: [name], [name] Treating Tyashia Morrisette/Extender: [name] in Treatment: 59 Active  Problems Location of Pain Severity and Description of Pain Patient Has Paino Yes Site Locations With Dressing Change: Yes Duration of the Pain. Constant / Intermittento Constant Rate the pain. Current Pain Level: 4 Worst Pain Level: 5 Least Pain Level: 4 Tolerable Pain Level: 5 Character of Pain Describe the Pain: Aching, Burning Pain Management and Medication Current Pain Management: Medication: Yes Cold Application: No Rest: Yes Massage: No Activity: No T.E.N.S.: No Heat Application: No Leg drop or elevation: No Is the Current Pain Management Adequate: Inadequate How does your wound impact your activities of daily livingo Sleep: Yes Bathing: No Appetite: No Relationship With Others: No Bladder  Continence: No Emotions: No Bowel Continence: No Work: No Toileting: No Drive: No Dressing: No Hobbies: No Electronic Signature(s) Signed: 07/09/2022 4:11:29 PM By: [name] Pax RN Entered By: [name] on 07/09/2022 16:11:29 Geisel, [name] ([id number]) -------------------------------------------------------------------------------- Patient/Caregiver Education Details Patient Name: Sievert, [name] Date of Service: 07/09/2022 1:15 PM Medical Record Number: [id number] Patient Account Number: [id number] Date of Birth/Gender: 09-17-1961 (61 y.o. F) Treating RN: [name] Primary Care Physician: [name] Other Clinician: Referring Physician: Card, [name] Treating Physician/Extender: [name] in Treatment: 62 Education Assessment Education Provided To: Patient Education Topics Provided Wound/Skin Impairment: Methods: Explain/Verbal Responses: State content correctly Electronic Signature(s) Signed: 07/12/2022 10:45:13 AM By: [name] Pax RN Entered By: [name] on 07/09/2022 14:01:22 Hunt, [name] ([id number]) -------------------------------------------------------------------------------- Wound Assessment Details Patient Name: Denley, [name] Date of Service: 07/09/2022 1:15 PM Medical Record Number: [id number] Patient Account Number: [id number] Date of Birth/Sex: [birth year] (61 y.o. F) Treating RN: [name] Primary Care Morena Mckissack: [name], [name] Other Clinician: Referring Mishelle Hassan: [name], [name] Treating Marlaya Turck/Extender: [name] in Treatment: 38 Wound Status Wound Number: 1 Primary Etiology: Dehisced Wound Wound Location: Distal, Midline Back Wound Status: Open Wounding Event: Surgical Injury Comorbid History: Hypertension Date Acquired: 04/11/2021 Weeks Of Treatment: 38 Clustered Wound: No Photos Wound Measurements Length: (cm) 0.3 Width: (cm) 0.3 Depth: (cm) 2.5 Area: (cm) 0.071 Volume: (cm) 0.177 % Reduction in Area:  43.7% % Reduction in Volume: 69.4% Epithelialization: None Tunneling: No Undermining: Yes Starting Position (o'clock): 9 Ending Position (o'clock): 3 Maximum Distance: (cm) 3.5 Wound Description Classification: Full Thickness Without Exposed Support Structu Exudate Amount: Medium Exudate Type: Serosanguineous Exudate Color: red, brown res Foul Odor After Cleansing: No Slough/Fibrino No Wound Bed Granulation Amount: Large (67-100%) Exposed Structure Granulation Quality: Red Fascia Exposed: No Necrotic Amount: None Present (0%) Fat Layer (Subcutaneous Tissue) Exposed: Yes Tendon Exposed: No Muscle Exposed: No Joint Exposed: No Bone Exposed: No Electronic Signature(s) Signed: 08/05/2022 2:30:55 PM By: [name] Pax RN Previous Signature: 07/12/2022 10:45:13 AM Version By: [name] Pax RN Entered By: [name] on 08/05/2022 14:30:55 Shave, [name] ([id number]) -------------------------------------------------------------------------------- Vitals Details Patient Name: Dizdarevic, Karthika [name]. Date of Service: 07/09/2022 1:15 PM Medical Record Number: [id number] Patient Account Number: [id number] Date of Birth/Sex: September 14, 1961 (61 y.o. F) Treating RN: [name] Primary Care Rowene Suto: [name] Other Clinician: Referring Dyna Figuereo: [name], [name] Treating Revanth Neidig/Extender: [name] in Treatment: 38 Vital Signs Time Taken: 13:18 Temperature (F): 98.2 Height (in): 58 Pulse (bpm): 85 Weight (lbs): 275 Respiratory Rate (breaths/min): 18 Body Mass Index (BMI): 57.5 Blood Pressure (mmHg): 134/77 Reference Range: 80 - 120 mg / dl Electronic Signature(s) Signed: 07/12/2022 10:45:13 AM By: [name] Pax RN Entered By: [name] on 07/09/2022 13:18:44

## 2022-07-12 NOTE — Progress Notes (Addendum)
CHAROLETTE, BULTMAN ([id number]) Visit Report for 07/11/2022 Arrival Information Details Patient Name: Baze, DESERAI CANSLER. Date of Service: 07/11/2022 3:30 PM Medical Record Number: [id number] Patient Account Number: [id number] Date of Birth/Sex: 1961-06-19 (61 y.o. F) Treating RN: Yevonne Pax Primary Care Verlie Hellenbrand: Card, Jonny Ruiz Other Clinician: Referring Adison Jerger: Card, John Treating Lijah Bourque/Extender: Rowan Blase in Treatment: 48 Visit Information History Since Last Visit All ordered tests and consults were completed: No Patient Arrived: Ambulatory Added or deleted any medications: No Arrival Time: 15:50 Any new allergies or adverse reactions: No Accompanied By: self Had a fall or experienced change in No Transfer Assistance: None activities of daily living that may affect Patient Identification Verified: Yes risk of falls: Secondary Verification Process Completed: Yes Signs or symptoms of abuse/neglect since last visito No Patient Requires Transmission-Based Precautions: No Hospitalized since last visit: No Patient Has Alerts: No Implantable device outside of the clinic excluding No cellular tissue based products placed in the center since last visit: Pain Present Now: No Electronic Signature(s) Signed: 07/12/2022 10:45:13 AM By: Yevonne Pax RN Entered By: Yevonne Pax on 07/11/2022 15:51:39 Knabe, Kailynne CMarland Kitchen ([id number]) -------------------------------------------------------------------------------- Clinic Level of Care Assessment Details Patient Name: Tremain, Tatayana C. Date of Service: 07/11/2022 3:30 PM Medical Record Number: [id number] Patient Account Number: [id number] Date of Birth/Sex: 1961/08/05 (61 y.o. F) Treating RN: Yevonne Pax Primary Care Izora Benn: Card, Jonny Ruiz Other Clinician: Referring Christoffer Currier: Card, John Treating Matty Deamer/Extender: Rowan Blase in Treatment: 38 Clinic Level of Care Assessment Items TOOL 4 Quantity Score []  - Use when only an  EandM is performed on FOLLOW-UP visit 0 ASSESSMENTS - Nursing Assessment / Reassessment X - Reassessment of Co-morbidities (includes updates in patient status) 1 10 X- 1 5 Reassessment of Adherence to Treatment Plan ASSESSMENTS - Wound and Skin Assessment / Reassessment X - Simple Wound Assessment / Reassessment - one wound 1 5 []  - 0 Complex Wound Assessment / Reassessment - multiple wounds []  - 0 Dermatologic / Skin Assessment (not related to wound area) ASSESSMENTS - Focused Assessment []  - Circumferential Edema Measurements - multi extremities 0 []  - 0 Nutritional Assessment / Counseling / Intervention []  - 0 Lower Extremity Assessment (monofilament, tuning fork, pulses) []  - 0 Peripheral Arterial Disease Assessment (using hand held doppler) ASSESSMENTS - Ostomy and/or Continence Assessment and Care []  - Incontinence Assessment and Management 0 []  - 0 Ostomy Care Assessment and Management (repouching, etc.) PROCESS - Coordination of Care X - Simple Patient / Family Education for ongoing care 1 15 []  - 0 Complex (extensive) Patient / Family Education for ongoing care []  - 0 Staff obtains , Records, Test Results / Process Orders []  - 0 Staff telephones HHA, Nursing Homes / Clarify orders / etc []  - 0 Routine Transfer to another Facility (non-emergent condition) []  - 0 Routine Hospital Admission (non-emergent condition) []  - 0 New Admissions / / Ordering NPWT, Apligraf, etc. []  - 0 Emergency Hospital Admission (emergent condition) X- 1 10 Simple Discharge Coordination []  - 0 Complex (extensive) Discharge Coordination PROCESS - Special Needs []  - Pediatric / Minor Patient Management 0 []  - 0 Isolation Patient Management []  - 0 Hearing / Language / Visual special needs []  - 0 Assessment of Community assistance (transportation, D/C planning, etc.) []  - 0 Additional assistance / Altered mentation []  - 0 Support Surface(s) Assessment  (bed, cushion, seat, etc.) INTERVENTIONS - Wound Cleansing / Measurement Sedita, Keyonda C. ( ) X- 1 5 Simple Wound Cleansing - one wound []  - 0 Complex Wound Cleansing -  multiple wounds X- 1 5 Wound Imaging (photographs - any number of wounds) []  - 0 Wound Tracing (instead of photographs) X- 1 5 Simple Wound Measurement - one wound []  - 0 Complex Wound Measurement - multiple wounds INTERVENTIONS - Wound Dressings []  - Small Wound Dressing one or multiple wounds 0 X- 1 15 Medium Wound Dressing one or multiple wounds []  - 0 Large Wound Dressing one or multiple wounds []  - 0 Application of Medications - topical []  - 0 Application of Medications - injection INTERVENTIONS - Miscellaneous []  - External ear exam 0 []  - 0 Specimen Collection (cultures, biopsies, blood, body fluids, etc.) []  - 0 Specimen(s) / Culture(s) sent or taken to Lab for analysis []  - 0 Patient Transfer (multiple staff / / Similar devices) []  - 0 Simple Staple / Suture removal (25 or less) []  - 0 Complex Staple / Suture removal (26 or more) []  - 0 Hypo / Hyperglycemic Management (close monitor of Blood Glucose) []  - 0 Ankle / Brachial Index (ABI) - do not check if billed separately X- 1 5 Vital Signs Has the patient been seen at the hospital within the last three years: Yes Total Score: 80 Level Of Care: New/Established - Level 3 Electronic Signature(s) Signed: 07/12/2022 10:45:13 AM By: RN Entered By: on 07/11/2022 15:54:29 Siwek, Cardelia C ( ) -------------------------------------------------------------------------------- Encounter Discharge Information Details Patient Name: Lalone, Ladena C. Date of Service: 07/11/2022 3:30 PM Medical Record Number: Patient Account Number: Date of Birth/Sex: 1961/02/25 (61 y.o. F) Treating RN: Primary Care Navya Timmons: Other Clinician: Referring Rodolfo Gaster:  Card, John Treating Lolly Glaus/Extender: in Treatment: 24 Encounter Discharge Information Items Discharge Condition: Stable Ambulatory Status: Walker Discharge Destination: Home Transportation: Private Auto Accompanied By: daughter Schedule Follow-up Appointment: Yes Clinical Summary of Care: Electronic Signature(s) Signed: 07/12/2022 10:45:13 AM By: Yevonne Pax RN Entered By: 07/13/2022 on 07/11/2022 15:53:41 Goodell, Raphael C. ([id number]) -------------------------------------------------------------------------------- Wound Assessment Details Patient Name: Valeri, Syenna C. Date of Service: 07/11/2022 3:30 PM Medical Record Number: [id number] Patient Account Number: [id number] Date of Birth/Sex: 1961/10/05 (61 y.o. F) Treating RN: Yevonne Pax Primary Care Burl Tauzin: Card, [name] Other Clinician: Referring Ricki Clack: Card, John Treating Monika Chestang/Extender: Rowan Blase in Treatment: 38 Wound Status Wound Number: 1 Primary Etiology: Dehisced Wound Wound Location: Distal, Midline Back Wound Status: Open Wounding Event: Surgical Injury Comorbid History: Hypertension Date Acquired: 04/11/2021 Weeks Of Treatment: 38 Clustered Wound: No Wound Measurements Length: (cm) 0.3 Width: (cm) 0.3 Depth: (cm) 2.5 Area: (cm) 0.071 Volume: (cm) 0.177 % Reduction in Area: 43.7% % Reduction in Volume: 69.4% Epithelialization: None Tunneling: No Undermining: Yes Starting Position (o'clock): 9 Ending Position (o'clock): 3 Maximum Distance: (cm) 3.5 Wound Description Classification: Full Thickness Without Exposed Support Structu Exudate Amount: Medium Exudate Type: Serosanguineous Exudate Color: red, brown res Foul Odor After Cleansing: No Slough/Fibrino No Wound Bed Granulation Amount: Large (67-100%) Exposed Structure Granulation Quality: Red Fascia Exposed: No Necrotic Amount: None Present (0%) Fat Layer (Subcutaneous Tissue) Exposed: Yes Tendon  Exposed: No Muscle Exposed: No Joint Exposed: No Bone Exposed: No Electronic Signature(s) Signed: 08/05/2022 2:30:30 PM By: Yevonne Pax RN Previous Signature: 07/12/2022 10:45:13 AM Version By: [id number] RN Entered By: 07/13/2022 on 08/05/2022 14:30:29

## 2022-07-12 NOTE — Progress Notes (Signed)
[name], BOLLIG ([id number]) Visit Report for 07/11/2022 Physician Orders Details Patient Name: [name], [name] Date of Service: 07/11/2022 3:30 PM Medical Record Number: [id number] Patient Account Number: [id number] Date of Birth/Sex: 1961/04/30 (61 y.o. F) Treating RN: Yevonne Pax Primary Care Provider: [name] Other Clinician: Referring Provider: Card, John Treating Provider/Extender: Rowan Blase in Treatment: 1 Verbal / Phone Orders: No Diagnosis Coding Follow-up Appointments o Return Appointment in 1 week. o Nurse Visit as needed Home Health o Home Health [facility]: [facility] o [facility] for wound care. May utilize formulary equivalent dressing for wound treatment orders unless otherwise specified. Home Health Nurse may visit PRN to address patientos wound care needs. - Monday and Friday [facility] fax [phone] Bathing/ Shower/ Hygiene o May shower; gently cleanse wound with antibacterial soap, rinse and pat dry prior to dressing wounds o No tub bath. Anesthetic (Use 'Patient Medications' Section for Anesthetic Order Entry) o Lidocaine applied to wound bed Edema Control - Lymphedema / Segmental Compressive Device / Other o Elevate, Exercise Daily and Avoid Standing for Long Periods of Time. o Elevate legs to the level of the heart and pump ankles as often as possible o Elevate leg(s) parallel to the floor when sitting. Additional Orders / Instructions o Follow Nutritious Diet and Increase Protein Intake Wound Treatment Wound #1 - Back Wound Laterality: Midline, Distal Cleanser: Normal Saline 1 x Per Day/30 Days Discharge Instructions: Wash your hands with soap and water. Remove old dressing, discard into plastic bag and place into trash. Cleanse the wound with Normal Saline prior to applying a clean dressing using gauze sponges, not tissues or cotton balls. Do not scrub or use excessive force. Pat dry using gauze sponges, not  tissue or cotton balls. Primary Dressing: Hydrofera Blue Rope 1 x Per Day/30 Days Discharge Instructions: cut into fourths; angle the end Secondary Dressing: (SILICONE BORDER) Zetuvit Plus SILICONE BORDER Dressing 4x4 (in/in) 1 x Per Day/30 Days Electronic Signature(s) Signed: 07/11/2022 4:35:29 PM By: [name] PA-C Signed: 07/12/2022 10:45:13 AM By: Yevonne Pax RN Entered By: Yevonne Pax on 07/11/2022 15:52:31 Milanese, Mikki C. ([id number]) -------------------------------------------------------------------------------- SuperBill Details Patient Name: [name], [name] Date of Service: 07/11/2022 Medical Record Number: [id number] Patient Account Number: [id number] Date of Birth/Sex: 03/31/1961 (61 y.o. 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

## 2022-07-16 ENCOUNTER — Encounter: Payer: 59 | Admitting: Physician Assistant

## 2022-07-16 DIAGNOSIS — L98422 Non-pressure chronic ulcer of back with fat layer exposed: Secondary | ICD-10-CM | POA: Diagnosis not present

## 2022-07-16 NOTE — Progress Notes (Addendum)
[name], [name] ([id number]) Visit Report for 07/16/2022 Arrival Information Details Patient Name: [name], [name] Date of Service: 07/16/2022 1:15 PM Medical Record Number: [id number] Patient Account Number: [id number] Date of Birth/Sex: Jul 22, 1961 (61 y.o. F) Treating RN: Yevonne Pax Primary Care Mimie Goering: Card, Jonny Ruiz Other Clinician: Referring Vila Dory: Card, John Treating Sameka Bagent/Extender: Rowan Blase in Treatment: 39 Visit Information History Since Last Visit All ordered tests and consults were completed: No Patient Arrived: [name] Added or deleted any medications: No Arrival Time: 13:24 Any new allergies or adverse reactions: No Accompanied By: self Had a fall or experienced change in No Transfer Assistance: None activities of daily living that may affect Patient Identification Verified: Yes risk of falls: Secondary Verification Process Completed: Yes Signs or symptoms of abuse/neglect since last visito No Patient Requires Transmission-Based Precautions: No Hospitalized since last visit: No Patient Has Alerts: No Implantable device outside of the clinic excluding No cellular tissue based products placed in the center since last visit: Has Dressing in Place as Prescribed: Yes Pain Present Now: No Electronic Signature(s) Signed: 07/17/2022 3:29:37 PM By: Yevonne Pax RN Entered By: Yevonne Pax on 07/16/2022 13:24:42 [name], [name] ([id number]) -------------------------------------------------------------------------------- Clinic Level of Care Assessment Details Patient Name: [name], [name] Date of Service: 07/16/2022 1:15 PM Medical Record Number: [id number] Patient Account Number: [id number] Date of Birth/Sex: 02-06-1961 (61 y.o. F) Treating RN: Yevonne Pax Primary Care Chabely Norby: Card, Jonny Ruiz Other Clinician: Referring Visente Kirker: Card, John Treating Glade Strausser/Extender: Rowan Blase in Treatment: 39 Clinic Level of Care Assessment Items TOOL 4  Quantity Score X - Use when only an EandM is performed on FOLLOW-UP visit 1 0 ASSESSMENTS - Nursing Assessment / Reassessment X - Reassessment of Co-morbidities (includes updates in patient status) 1 10 X- 1 5 Reassessment of Adherence to Treatment Plan ASSESSMENTS - Wound and Skin Assessment / Reassessment X - Simple Wound Assessment / Reassessment - one wound 1 5 []  - 0 Complex Wound Assessment / Reassessment - multiple wounds []  - 0 Dermatologic / Skin Assessment (not related to wound area) ASSESSMENTS - Focused Assessment []  - Circumferential Edema Measurements - multi extremities 0 []  - 0 Nutritional Assessment / Counseling / Intervention []  - 0 Lower Extremity Assessment (monofilament, tuning fork, pulses) []  - 0 Peripheral Arterial Disease Assessment (using hand held doppler) ASSESSMENTS - Ostomy and/or Continence Assessment and Care []  - Incontinence Assessment and Management 0 []  - 0 Ostomy Care Assessment and Management (repouching, etc.) PROCESS - Coordination of Care X - Simple Patient / Family Education for ongoing care 1 15 []  - 0 Complex (extensive) Patient / Family Education for ongoing care []  - 0 Staff obtains , Records, Test Results / Process Orders []  - 0 Staff telephones HHA, Nursing Homes / Clarify orders / etc []  - 0 Routine Transfer to another Facility (non-emergent condition) []  - 0 Routine Hospital Admission (non-emergent condition) []  - 0 New Admissions / / Ordering NPWT, Apligraf, etc. []  - 0 Emergency Hospital Admission (emergent condition) X- 1 10 Simple Discharge Coordination []  - 0 Complex (extensive) Discharge Coordination PROCESS - Special Needs []  - Pediatric / Minor Patient Management 0 []  - 0 Isolation Patient Management []  - 0 Hearing / Language / Visual special needs []  - 0 Assessment of Community assistance (transportation, D/C planning, etc.) []  - 0 Additional assistance / Altered  mentation []  - 0 Support Surface(s) Assessment (bed, cushion, seat, etc.) INTERVENTIONS - Wound Cleansing / Measurement [name], [name] ( ) X- 1 5 Simple Wound Cleansing -  one wound []  - 0 Complex Wound Cleansing - multiple wounds X- 1 5 Wound Imaging (photographs - any number of wounds) []  - 0 Wound Tracing (instead of photographs) X- 1 5 Simple Wound Measurement - one wound []  - 0 Complex Wound Measurement - multiple wounds INTERVENTIONS - Wound Dressings X - Small Wound Dressing one or multiple wounds 1 10 []  - 0 Medium Wound Dressing one or multiple wounds []  - 0 Large Wound Dressing one or multiple wounds []  - 0 Application of Medications - topical []  - 0 Application of Medications - injection INTERVENTIONS - Miscellaneous []  - External ear exam 0 []  - 0 Specimen Collection (cultures, biopsies, blood, body fluids, etc.) []  - 0 Specimen(s) / Culture(s) sent or taken to Lab for analysis []  - 0 Patient Transfer (multiple staff / Civil Service fast streamer / Similar devices) []  - 0 Simple Staple / Suture removal (25 or less) []  - 0 Complex Staple / Suture removal (26 or more) []  - 0 Hypo / Hyperglycemic Management (close monitor of Blood Glucose) []  - 0 Ankle / Brachial Index (ABI) - do not check if billed separately X- 1 5 Vital Signs Has the patient been seen at the hospital within the last three years: Yes Total Score: 75 Level Of Care: New/Established - Level 2 Electronic Signature(s) Signed: 07/17/2022 3:29:37 PM By: Carlene Coria RN Entered By: Carlene Coria on 07/16/2022 13:40:20 [name], [name] ([id number]) -------------------------------------------------------------------------------- Encounter Discharge Information Details Patient Name: [name], [name] Date of Service: 07/16/2022 1:15 PM Medical Record Number: [id number] Patient Account Number: [id number] Date of Birth/Sex: 1961-04-30 (61 y.o. F) Treating RN: Carlene Coria Primary Care Shavone Nevers:  Clayborn Heron Other Clinician: Referring Tian Mcmurtrey: Card, John Treating Pantelis Elgersma/Extender: Skipper Cliche in Treatment: 37 Encounter Discharge Information Items Discharge Condition: Stable Ambulatory Status: Ambulatory Discharge Destination: Home Transportation: Private Auto Accompanied By: self Schedule Follow-up Appointment: Yes Clinical Summary of Care: Electronic Signature(s) Signed: 07/17/2022 3:29:37 PM By: Carlene Coria RN Entered By: Carlene Coria on 07/16/2022 13:41:19 Pizzuto, Pixie C. ([id number]) -------------------------------------------------------------------------------- Lower Extremity Assessment Details Patient Name: [name], [name] Date of Service: 07/16/2022 1:15 PM Medical Record Number: [id number] Patient Account Number: [id number] Date of Birth/Sex: 03-02-1961 (61 y.o. F) Treating RN: Carlene Coria Primary Care Lovelace Cerveny: Clayborn Heron Other Clinician: Referring Lizbeth Feijoo: Card, John Treating Simora Dingee/Extender: Skipper Cliche in Treatment: 39 Electronic Signature(s) Signed: 07/17/2022 3:29:37 PM By: Carlene Coria RN Entered By: Carlene Coria on 07/16/2022 13:29:51 [name], [name] ([id number]) -------------------------------------------------------------------------------- Multi Wound Chart Details Patient Name: Metts, Eisha C. Date of Service: 07/16/2022 1:15 PM Medical Record Number: [id number] Patient Account Number: [id number] Date of Birth/Sex: [birth year] (61 y.o. F) Treating RN: Carlene Coria Primary Care Sharine Cadle: Clayborn Heron Other Clinician: Referring Addaline Peplinski: Card, John Treating Gertie Broerman/Extender: Skipper Cliche in Treatment: 39 Vital Signs Height(in): 77 Pulse(bpm): 45 Weight(lbs): 275 Blood Pressure(mmHg): 120/82 Body Mass Index(BMI): 57.5 Temperature(F): 98.2 Respiratory Rate(breaths/min): 18 Photos: [N/A:N/A] Wound Location: Distal, Midline Back N/A N/A Wounding Event: Surgical Injury N/A N/A Primary Etiology: Dehisced Wound N/A  N/A Comorbid History: Hypertension N/A N/A Date Acquired: 04/11/2021 N/A N/A Weeks of Treatment: 39 N/A N/A Wound Status: Open N/A N/A Wound Recurrence: No N/A N/A Measurements L x W x D (cm) 0.3x0.3x2.5 N/A N/A Area (cm) : 0.071 N/A N/A Volume (cm) : 0.177 N/A N/A % Reduction in Area: 43.70% N/A N/A % Reduction in Volume: 69.40% N/A N/A Classification: Full Thickness Without Exposed N/A N/A Support Structures Exudate Amount: Medium N/A N/A Exudate Type: Serosanguineous N/A N/A Exudate  Color: red, brown N/A N/A Granulation Amount: Large (67-100%) N/A N/A Granulation Quality: Red N/A N/A Necrotic Amount: None Present (0%) N/A N/A Exposed Structures: Fat Layer (Subcutaneous Tissue): N/A N/A Yes Fascia: No Tendon: No Muscle: No Joint: No Bone: No Epithelialization: None N/A N/A Treatment Notes Electronic Signature(s) Signed: 07/17/2022 3:29:37 PM By: Carlene Coria RN Entered By: Carlene Coria on 07/16/2022 13:30:06 Meaders, Thana Farr ([id number]) -------------------------------------------------------------------------------- [place] Details Patient Name: [name], [name] Date of Service: 07/16/2022 1:15 PM Medical Record Number: [id number] Patient Account Number: [id number] Date of Birth/Sex: September 21, 1961 (61 y.o. F) Treating RN: Carlene Coria Primary Care Johnel Yielding: Clayborn Heron Other Clinician: Referring Odile Veloso: Card, John Treating Ivone Licht/Extender: Skipper Cliche in Treatment: 82 Active Inactive Wound/Skin Impairment Nursing Diagnoses: Knowledge deficit related to ulceration/compromised skin integrity Goals: Patient/caregiver will verbalize understanding of skin care regimen Date Initiated: 10/15/2021 Target Resolution Date: 08/09/2022 Goal Status: Active Ulcer/skin breakdown will have a volume reduction of 30% by week 4 Date Initiated: 10/15/2021 Date Inactivated: 01/29/2022 Target Resolution Date: 12/15/2021 Goal Status: Unmet Unmet  Reason: comorbities Ulcer/skin breakdown will have a volume reduction of 50% by week 8 Date Initiated: 10/15/2021 Date Inactivated: 01/29/2022 Target Resolution Date: 01/15/2022 Goal Status: Unmet Unmet Reason: comorbities Ulcer/skin breakdown will have a volume reduction of 80% by week 12 Date Initiated: 10/15/2021 Date Inactivated: 02/19/2022 Target Resolution Date: 02/15/2022 Goal Status: Unmet Unmet Reason: comorbities Ulcer/skin breakdown will heal within 14 weeks Date Initiated: 10/15/2021 Date Inactivated: 05/07/2022 Target Resolution Date: 03/15/2022 Goal Status: Unmet Unmet Reason: comorbities Interventions: Assess patient/caregiver ability to obtain necessary supplies Assess patient/caregiver ability to perform ulcer/skin care regimen upon admission and as needed Assess ulceration(s) every visit Notes: Electronic Signature(s) Signed: 07/17/2022 3:29:37 PM By: Carlene Coria RN Entered By: Carlene Coria on 07/16/2022 13:29:59 [name], [name] ([id number]) -------------------------------------------------------------------------------- Pain Assessment Details Patient Name: [name], [name] Date of Service: 07/16/2022 1:15 PM Medical Record Number: [id number] Patient Account Number: [id number] Date of Birth/Sex: Dec 07, 1961 (61 y.o. F) Treating RN: Carlene Coria Primary Care Flint Hakeem: Clayborn Heron Other Clinician: Referring Andrue Dini: Card, John Treating Delrick Dehart/Extender: Skipper Cliche in Treatment: 39 Active Problems Location of Pain Severity and Description of Pain Patient Has Paino No Site Locations Pain Management and Medication Current Pain Management: Electronic Signature(s) Signed: 07/17/2022 3:29:37 PM By: Carlene Coria RN Entered By: Carlene Coria on 07/16/2022 13:28:24 [name], [name] ([id number]) -------------------------------------------------------------------------------- Patient/Caregiver Education Details Patient Name: Shek, Jaeleigh C. Date of  Service: 07/16/2022 1:15 PM Medical Record Number: [id number] Patient Account Number: [id number] Date of Birth/Gender: 1961-08-12 (61 y.o. F) Treating RN: Carlene Coria Primary Care Physician: Clayborn Heron Other Clinician: Referring Physician: Card, John Treating Physician/Extender: Skipper Cliche in Treatment: 50 Education Assessment Education Provided To: Patient Education Topics Provided Wound/Skin Impairment: Methods: Explain/Verbal Responses: State content correctly Electronic Signature(s) Signed: 07/17/2022 3:29:37 PM By: Carlene Coria RN Entered By: Carlene Coria on 07/16/2022 13:40:41 Waites, Deshaun C. ([id number]) -------------------------------------------------------------------------------- Wound Assessment Details Patient Name: Siravo, Lorrin C. Date of Service: 07/16/2022 1:15 PM Medical Record Number: [id number] Patient Account Number: [id number] Date of Birth/Sex: Feb 23, 1961 (61 y.o. F) Treating RN: Carlene Coria Primary Care Wataru Mccowen: Clayborn Heron Other Clinician: Referring Shonteria Abeln: Card, John Treating Tieisha Darden/Extender: Skipper Cliche in Treatment: 39 Wound Status Wound Number: 1 Primary Etiology: Dehisced Wound Wound Location: Distal, Midline Back Wound Status: Open Wounding Event: Surgical Injury Comorbid History: Hypertension Date Acquired: 04/11/2021 Weeks Of Treatment: 39 Clustered Wound: No Photos Wound Measurements Length: (cm) 0.3 Width: (cm) 0.3 Depth: (cm) 2.5 Area: (cm)  0.071 Volume: (cm) 0.177 % Reduction in Area: 43.7% % Reduction in Volume: 69.4% Epithelialization: None Tunneling: No Undermining: Yes Starting Position (o'clock): 9 Ending Position (o'clock): 3 Maximum Distance: (cm) 3.5 Wound Description Classification: Full Thickness Without Exposed Support Structu Exudate Amount: Medium Exudate Type: Serosanguineous Exudate Color: red, brown res Foul Odor After Cleansing: No Slough/Fibrino No Wound Bed Granulation Amount:  Large (67-100%) Exposed Structure Granulation Quality: Red Fascia Exposed: No Necrotic Amount: None Present (0%) Fat Layer (Subcutaneous Tissue) Exposed: Yes Tendon Exposed: No Muscle Exposed: No Joint Exposed: No Bone Exposed: No Electronic Signature(s) Signed: 08/05/2022 2:29:58 PM By: Yevonne Pax RN Previous Signature: 07/17/2022 3:29:37 PM Version By: Yevonne Pax RN Entered By: Yevonne Pax on 08/05/2022 14:29:57 Niebuhr, Sianni C. ([id number]) -------------------------------------------------------------------------------- Vitals Details Patient Name: Engen, Khalise C. Date of Service: 07/16/2022 1:15 PM Medical Record Number: [id number] Patient Account Number: [id number] Date of Birth/Sex: [birth year] (61 y.o. F) Treating RN: Yevonne Pax Primary Care Cheyne Boulden: [name] Other Clinician: Referring Dagoberto Nealy: Card, John Treating Georgeanne Frankland/Extender: Rowan Blase in Treatment: 39 Vital Signs Time Taken: 13:24 Temperature (F): 98.2 Height (in): 58 Pulse (bpm): 80 Weight (lbs): 275 Respiratory Rate (breaths/min): 18 Body Mass Index (BMI): 57.5 Blood Pressure (mmHg): 120/82 Reference Range: 80 - 120 mg / dl Electronic Signature(s) Signed: 07/17/2022 3:29:37 PM By: Yevonne Pax RN Entered By: Yevonne Pax on 07/16/2022 13:28:10

## 2022-07-16 NOTE — Progress Notes (Signed)
NOZOMI, METTLER ([id number]) Visit Report for 07/16/2022 Chief Complaint Document Details Patient Name: [name], [name] Date of Service: 07/16/2022 1:15 PM Medical Record Number: [id number] Patient Account Number: [id number] Date of Birth/Sex: January 12, 1961 (61 y.o. F) Treating RN: Yevonne Pax Primary Care Provider: [name] Other Clinician: Referring Provider: Card, John Treating Provider/Extender: Rowan Blase in Treatment: 39 Information Obtained from: Patient Chief Complaint Surgical Back Ulcer Electronic Signature(s) Signed: 07/16/2022 1:22:02 PM By: [name] PA-[name] Entered By: [name] on 07/16/2022 13:22:01 Garzon, Shalayna CMarland Kitchen ([id number]) -------------------------------------------------------------------------------- HPI Details Patient Name: [name], [name] Date of Service: 07/16/2022 1:15 PM Medical Record Number: [id number] Patient Account Number: [id number] Date of Birth/Sex: Oct 27, 1961 (61 y.o. 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 This was actually broken up into 3 separate surgical events. The initial surgical intervention actually was on November 05, 2021 almost a year ago. Subsequently the patient went back in February for a seroma of the area which unfortunately required her to have a repeat surgery to go in and clean this out. And then again this occurred in April where she went back in and again they felt like stitches were coming out and there was an additional seroma. She was placed in a wound VAC initially and then subsequently as it got smaller that  was discontinued. Again right now I will see anything that I think a wound VAC would help with. Nonetheless she definitely has a significant depth to the wound that is going require packing. I actually believe the Hydrofera Blue rope would probably do quite well with this the problem is as much as it is draining she probably needs this to be changed at least every day. She does not really have anyone that can help with that that is the complicating scenario here. With that being said the patient does have a history of multiple sclerosis, hypertension, and again this surgical wound dehiscence in regard to her lumbar spine region. She did have a repeat MRI which was actually completed 10/09/2021. This showed that there was no significant change in the subcutaneous fluid collection/track of the lower lumbar region. This is extending to the level of the fascia unfortunately. This seems to go all the way from the L2 level with a track extending all the way to the fascia at the L4-5 level. Again this is a significant wound and there is significant drainage but does not seem to communicate to the spinal region as far as spinal fluid or otherwise is concerned that is good news. Nonetheless she last saw Dr. Franky Macho who is her neurosurgeon on 10/01/2021 that was when he ordered this last MRI she supposed to see him next week as well. With that being said he did not feel like there was any significant issue there but was not sure why this was not healing that is when he ordered the MRI. They were wanting to make sure that this was packed appropriately by home health unfortunately the main issue currently is that home health is completely out of the picture as the patient has exhausted all the home health that that she gets for a year. She is now in a very difficult  predicament where she does not have anyone to help her change the dressing and to be honest that she is not able to do it herself with the location of  the wound being on the midline lumbar spine region. If she does not have anyone that can help it is probably can to be necessary for her to go to a facility for rehab and daily dressing changes as I feel like daily changes which is much drainage that she is having is going to be necessary. 10/23/2021 upon evaluation today patient appears to be doing decently well in regard to her back ulcer. This does seem to be draining a lot less than what it is been doing in the past. With that being said she still has quite a bit of drainage nonetheless. I do think that given time this should improve least I hope so. The good news is she does have home health coming out 3 days a week were doing it 2 days a week and she is paying someone we can to help. 10/30/2021 upon evaluation today patient appears to be doing okay in regard to her back ulcer this is not draining quite as bad as it was in the beginning but he still has quite a bit of drainage noted. I do believe that the patient would benefit from [place] going ahead forward with attempting a wound VAC using the Hydrofera Blue rope to pack with and then subsequently using the VAC externally to actually suction out and help this to fill- in. I think this is our ideal way to try to get things cleared at this point. As it stands I am not certain that we are really making a progress that we want to see near with doing it in the way we are which is packing with the rope. It is a good dressing but I do think it is insufficient for total healing. She just seems to have too much in the way of drainage at this point unfortunately. 11/06/2021 upon evaluation today patient unfortunately continues to have issues with her back ongoing. The good news is her MRI that was repeated showed signs of the size of this area in the lumbar spine region having decreased from 4 cm to 3.5 cm this is definitely not bad news at all. With that being said unfortunately she continues to have issues  with ongoing drainage not as severe as in the beginning but nonetheless still significant. I do think a wound VAC still would be a good way to go although her home health agency nurse apparently has some concerns about the possibility of not being able to keep a seal with this as they had struggles in the past. Nonetheless I explained to the patient that this is much different than what she had previous and that I really feel like it would do much better as far as getting the area taken care of without having any complications or issues here. I think that we should be able to maintain a seal. Nonetheless at this time I did discuss with the patient as well that she probably does need to have a wound VAC in order for [place] to get this moving in the right direction. 11/13/2021 upon evaluation patient's wound bed actually showed signs of significant drainage at this time. She did see the surgeon yesterday he did not see anything that appeared to be infected. Nonetheless he does appear that she is continuing to have areas here that just do not seem  to want to seal up there MRI findings have been negative but nonetheless she continues to have is the seroma that is filling in. I do feel like we need to try to widen the hole so we can get at least a half of the [facility] then this will be better than nothing at this point. 11/20/2021 upon evaluation today patient actually appears to be having less pain at this point which is good news and overall she we still do not have the results of the culture back yet it had to be sent out to [facility] and we do not have the result back yet. Is doing decently well in regard to her wound. Fortunately there does not appear to be any signs of active infection systemically nor locally at this time. 11/27/2021 upon evaluation today patient appears to be doing well with regard to her wound all things considered there does appear to be less drainage than there was previous.  Fortunately I do not see any evidence of worsening of the patient is stating that she is having some issues with back pain. This is somewhat new. Again this I think could be related to the fact that she is having some issues here with infection. We are still waiting to see what the result of her culture shows from susceptibility testing Enterococcus has been identified but we do not know if this is VRE or not. 12/04/2021 upon evaluation today patient appears to be doing well with regard to her wound all things considered. I did have a conversation with Erin from Dr. Sueanne Margarita office. Of note she notes that Dr. Drinda Butts really does not want to do anything surgical right now which I completely understand. With that being said I am still leery of how far we will make it getting this to heal short of any type of surgery to open this up and allow [place] to more appropriately packed the wound. Nonetheless I will absolutely give it our best shot as far as that is concerned. I discussed that with the patient today. She voiced understanding. The good news is the drainage today seems to be clear it is no longer cloudy as it was previous I am actually very pleased in that regard. SHAQUANTA, HARKLESS ([id number]) 12/11/2021 since have last seen the patient actually did have an conversation with Dr. Franky Macho with her neurosurgeon. Again this involve the discussion around whether or not to open the wound and try to apply a wound VAC following. With that being said the decision was made that that may be the best thing to do if the patient was in agreement. Nonetheless I am actually extremely encouraged with what I am seeing today much more than I would have thought. In fact I think that we may be over packing the wound which is why she is having some discomfort at this point as there is much less of the dressing able to get and this time compared to what we saw previous. That is actually really good news. In fact the 6:00  tunnel appears to have filled in is awesome news. At the 12:00 location I am actually able to pack into that area pretty effectively at this point today. In fact I was able to get less of the packing in which I will detail below. 12/18/2021 upon evaluation today patient appears to be doing well with regard to her wound on the back. Fortunately there is no signs of active infection which is great news and overall  very pleased with where things stand today. No fevers, chills, nausea, vomiting, or diarrhea. 01/01/2022 upon evaluation today patient appears to be doing decently well in regard to her wound. Fortunately there does not appear to be any signs of anything worsening which is great news. She still continues to have issues with drainage but this is not nearly as significant as what it was in the past. There is all clear drainage no signs of purulence noted. 01/08/2022 upon evaluation today patient appears to be doing well with regard to her wound. With that being said she tells me that she has been having some increased pain over the past week. It does appear based on the amount of Hydrofera Blue that we removed this is probably over pack. Again it is difficult to know exactly how old the nurses getting all the skin but nonetheless the length of Hydrofera Blue that was utilized was way too much which may be part of the reason why this felt so uncomfortable. Fortunately I think that we can cut back on that we discussed pieces smaller so hopefully that will not be over packed. 01/22/2021 upon evaluation today patient appears to be doing well With regard to there being no signs of infection at this time. The wound does still tunnel mainly up at the 12:00 location. The depth of the tunnel complete from entry point to the base is about 3.5 cm today. With that being said I do believe that overall she is making good progress this is just a very slow process for her which I know has been frustrating as  well. 01/29/2022 upon evaluation today patient appears to be doing well with regard to the wound on her lower back and the lumbar spine region. The good news is the depth that I got this week was a little bit less going up at the 12:00 location as compared to last week. I measured this to be 3.5 cm last week and it was 3.3 cm this week. Again that is a minute shift but nonetheless a good shift in the right direction. Overall I think that we are on the right track. 02/05/2022 upon evaluation today patient appears to be doing well with regard to her wound. In fact right now her measurements are somewhere around 2.9 cm which is definitely smaller and less deep than last week At the 12:00 location. Overall I am very pleased with what we are seeing. 02/19/2022 upon evaluation today patient unfortunately is noting that the wound is actually closing up externally but internally there is still some depth to this. I discussed with her today that we cannot let it close up like this is can end up being a significant issue for her if we allow for that. Subsequently we need to do what we can to try to open this up and keep it open more effectively. She voiced understanding. With that being said we are going to proceed with that procedure today in order to debride away some of the skin on externally that is trying to close and on this. 02/26/2022 upon evaluation patient appears to be doing better in my opinion after we had to open up this area last week. Again she has a significant amount of healing and overall I am extremely pleased with where things stand currently. I do not see any evidence of active infection locally nor systemically at this time which is great news and in general I think that we are on the right track for getting  this hopefully closed final and done. 03/05/2022 upon evaluation today patient unfortunately is doing a little bit worse with regard to the whole size this is starting to get much  smaller unfortunately. There does not appear to be any signs of active infection locally nor systemically which is great news. With that being said I am concerned about the fact that the patient is doing quite a bit worse with regard to trapping some fluid and almost feels like she may have a fluid pocket that not only goes in and up towards 12:00 but looks back around towards the more superficial subcutaneous tissue. I am very concerned that this is going to be something that is very hard for [place] to heal short of another surgery to open this up and try to wound VAC from the inside out. Even then there is no guarantees this is just a very difficult situation to be perfectly honest. 03/14/2022 upon evaluation today patient appears to be doing well with regard to her wound as far as the area staying open and pleased in that regard. With that being said unfortunately she is not doing as well when it comes to the irritation around it appears to be very inflamed and red this is not good that was discussed with her today as well. Subsequently I do believe that working to need to do what we can do to try and calm this down in the past she did well with the Augmentin due to Enterococcus infection I am not sure if were dealing with the same thing or not but at the same time I think that is probably to be my go to at this point. 03/19/2022 upon evaluation today patient appears to be doing really about the same in regard to her wound that the pain is better. 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 She seems to be improved dramatically with the antibiotic currently although it does not cover for MRSA I am apt to just see how this progresses over the next several days to a week. With that being said the bigger issue here is that we simply do not seem to be making excellent progress and I am concerned about the lack of progress. I discussed with Dr. Shon Baton with [facility] in [place]  and to be honest his recommendation was that the best bet would probably be to get her to a teaching center. She is actually been seen for rheumatology and a I believe psychologist/psychiatrist at [facility]. She would prefer to go that direction as opposed to [facility] or [facility]. I am definitely okay with that. 03/26/2022 upon evaluation today patient appears to be doing okay in regard to her wound. She is not showing signs of worsening and overall she tells me that her pain is not nearly as bad as it was previous. Fortunately I do not see any evidence of active infection locally or systemically which is great news. No fevers, chills, nausea, vomiting, or diarrhea. 04-02-2022 upon evaluation today patient appears to be doing well with regard to her wound. It does not appear to be showing any signs of worsening which is great news. With that being said you are still having some issues here with drainage although it is clear and mainly just tenderness with bleeding from the [facility] I do think that treating for the second issue which is MRSA is probably the right thing to do at this point she is now off of the Augmentin. 04-09-2022 upon  evaluation today patient's wound actually appears to be doing about the same. Fortunately I do not see any evidence of active infection locally or systemically at this time which is great news. No fevers, chills, nausea, vomiting, or diarrhea. 04-16-2022 upon evaluation today patient appears to be doing well with regard to her wound in the lumbar spine region. Subsequently she continues to have an area that does have a tunnel up into the 12:00 location. This is about 3.5 cm using a skinny probe curved upwards to get into this region. With that being said I really do not see any signs of overall improvement but also do not see any signs of worsening I feel like work on about a still made here to some degree. The patient did see her neurologist and he has referred her to  neurosurgery at [facility] for second ULYANA, HARDBARGER. ([id number]) opinion we will see what they have to say as well. 04-23-2022 upon evaluation today patient appears to be doing well with regard to her wound all things considered. She has been tolerating the dressing changes without complication. Fortunately I do not see any signs of active infection locally nor systemically which is great news. No fever chills noted 04-30-2022 upon evaluation today patient appears to be doing about the same in regard to her wound. The depth is really not dramatically improved as far as the 12:00 tunnel is concerned. The overall depth and the base of the wound does seem to be a little bit better it does sound like that external wound has closed as far as the opening is concerned we can have to cut this down from a half to a smaller size based on what we are seeing today. 05-07-2022 upon evaluation today patient's wound actually seems to be doing decently well. There is not a major shift here but a slight shift in the depth both straight and as well as in the [place] at 12:00. With that being said I again we will talk about a couple of millimeters but still every little bit can count when there are situations like this to be honest. 05-14-2022 upon evaluation today patient appears to be doing okay in regard to her wound. I do not see any signs of anything worsening. With that being said also do not see getting significantly better unfortunately. There does not appear to be any evidence of infection locally or systemically which is great news. No fevers, chills, nausea, vomiting, or diarrhea. 05-21-2022 upon evaluation today patient actually appears to be doing quite well in regard to her wound from the standpoint of there being no infection. With that being said there does not appear to be any evidence of infection locally nor systemically which is great news. No fevers, chills, nausea, vomiting, or diarrhea. 05-28-2022  upon evaluation today patient's wound actually appears to be doing about the same. I do not see any evidence of active infection locally or systemically which is great news. No fevers, chills, nausea, vomiting, or diarrhea. 06-04-2022 upon evaluation today patient appears to be doing well with regard to her wound. She has been tolerating dressing changes without complication. Fortunately I do not feel like there is any worsening also I do not feel like there is any improvement. I feel like right a solid area here where she has a tunnel that tracks up at 12:00 and then has a fluid pocket at around roughly 1:00 I can push on this and squeeze fluid out. Again I am not exactly sure  what else can be done from my standpoint to improve this. She does have an appointment with Dr. Christella Noa who I do believe is an excellent surgeon and this is the surgeon who performed this surgery initially. Again the biggest issue was following she had a lot of trouble with the wound VAC I do think that we need to try to see if she is can have a wound VAC following surgery what exactly regular need to do to help keep this moving in the right direction for her. Obviously I want her to have the best result possible that she has to go through surgery again to clean this area out. Absolutely willing to help out with getting this to heal. 06-13-2022 upon evaluation today patient appears to be doing well with regard to her wound its not any worse unfortunately it is also not significantly better which is the only issue currently. I do not see any signs of active infection locally or systemically which is great news. No fevers, chills, nausea, vomiting, or diarrhea. She does have her appointment on Monday with her neurosurgeon and we will see you Dr. Christella Noa has to say at that point. 06-18-2022 upon evaluation patient appears to be doing a little worse in regard to her wound only in the respect that has been several days since she has had  this changed in fact it was last changed on Friday. Since that time she tells me that she has kept this in place over the weekend and yesterday she was supposed to see her neurosurgeon yesterday but they had to cancel due to an emergency surgery according to what the patient tells me. Nonetheless she is having some issues here with drainage coming from the wound that is more purulent in nature I think this is something we have been seeing in the [facility] for several weeks but is more obvious being that it was not changed as much during the last week. She unfortunately is a little worried about this but again I think that this is just part of what we have been dealing with all long is more prevalent and obvious due to the length of time since it was changed last. She does have a repeat appointment for I believe July 10 she told me although she is going to see if she can find anything sooner. 06-25-2022 upon evaluation today patient's wound actually showed signs of marginal improvement which is good news. She still has the area of abscess that seems to be at roughly 2:00 when looking at her back. We can get into this with the skinny probe other than that I really do not know what else I can do to try to make this better. We discussed this and she does have an appointment with her neurosurgeon although that is not till mid July as he had to change it at the last moment during the time she was post to see him a week ago. She does have evidence of potential infection on culture I am going to go ahead and treat this in order to ensure that nothing worsens. With that being said I do believe that overall she seems to be doing well but I do think we want to make sure that that the knee gets worse in the meantime until she gets to see her neurosurgeon. 07-01-2022 upon evaluation today patient appears to continue to have trouble with her wound draining. Again we have been continue to monitor this and I still  see  the same issue that I have noted before the patient has an area which is draining seemingly from around the 2:00 location when you go up towards 12:00 and then over towards 2:00 looking at her back this is where there is actually a soft area external that if you push on it you will see fluid come out and subsequently this is also where it tracks to internally. Everything in the 6:00 location has completely healed in and this looks much better in that regard but this upper portion we just cannot get to closed with the methods that we have. It seemed to me that this is probably going to require that this area be open and cleaned out in order to allow it to heal and my suggestion would be that a wound VAC is probably going to be the ideal thing. 07-09-2022 upon evaluation today patient appears to be doing somewhat poorly in regard to her wound. She did see Dr. Franky Macho. He stated that the wound was not being "packed appropriately" according to the patient and her daughter who were present during the evaluation today as well as the evaluation with him yesterday. With that being said he packed the wound the way he said it needed to be and to be partly honest that is over packed. The patient is having a tremendous amount of discomfort and states that this is no way that she is good to be able to tolerate that. I understand the concern about the whole closing down which is why I recommended trying to keep this open is much as possible and again with that all being said this is still going to continue to be an issue as far as this closing down before everything heals up on the inside. Nonetheless she continues to have issues with significant drainage. She also has an area which almost feels like a small abscess or least of fluid pocket that feels up that was not appropriately packed with the dressings that were in place. With that being said I think that this is going to have to be opened at some point in  time in order to get this to heal I am really not certain the best way to go about this. I discussed that with the patient again today. 07-16-2022 upon evaluation today patient appears to be doing well currently in regard to her wound from the standpoint of the pain getting better which is great news. Fortunately I do not see any evidence of active infection locally or systemically which is great news. No fevers, chills, nausea, vomiting, or diarrhea. With that being said the patient has been tolerating the dressing changes better without complication which is great news. FREDRICKA, KOHRS ([id number]) Electronic Signature(s) Signed: 07/16/2022 1:41:01 PM By: [name] PA-[name] Entered By: [name] on 07/16/2022 13:41:01 Broerman, Domingo Pulse ([id number]) -------------------------------------------------------------------------------- Physical Exam Details Patient Name: Holck, [name] Date of Service: 07/16/2022 1:15 PM Medical Record Number: [id number] Patient Account Number: [id number] Date of Birth/Sex: 1961-12-09 (61 y.o. F) Treating RN: Yevonne Pax Primary Care Provider: [name] Other Clinician: Referring Provider: Card, John Treating Provider/Extender: Rowan Blase in Treatment: 64 Constitutional Well-nourished and well-hydrated in no acute distress. Respiratory normal breathing without difficulty. Psychiatric this patient is able to make decisions and demonstrates good insight into disease process. Alert and Oriented x 3. pleasant and cooperative. Notes Patient's wound bed showed signs of good granulation and epithelization at this point. Fortunately I do not see any evidence of  active infection locally or systemically which is great news and overall I am extremely pleased with where things stand currently. Electronic Signature(s) Signed: 07/16/2022 1:42:40 PM By: [name] PA-[name] Entered By: [name] on 07/16/2022 13:42:39 Mahan, Domingo Pulse  ([id number]) -------------------------------------------------------------------------------- Physician Orders Details Patient Name: [name], [name] Date of Service: 07/16/2022 1:15 PM Medical Record Number: [id number] Patient Account Number: [id number] Date of Birth/Sex: 03-Nov-1961 (61 y.o. 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 o Nurse Visit as needed Home Health o Home Health Company: [facility] o [facility] for wound care. May utilize formulary equivalent dressing for wound treatment orders unless otherwise specified. Home Health Nurse may visit PRN to address patientos wound care needs. - Monday and Friday [facility] fax [phone] Bathing/ Shower/ Hygiene o May shower; gently cleanse wound with antibacterial soap, rinse and pat dry prior to dressing wounds o No tub bath. Anesthetic (Use 'Patient Medications' Section for Anesthetic Order Entry) o Lidocaine applied to wound bed Edema Control - Lymphedema / Segmental Compressive Device / Other o Elevate, Exercise Daily and Avoid Standing for Long Periods of Time. o Elevate legs to the level of the heart and pump ankles as often as possible o Elevate leg(s) parallel to the floor when sitting. Additional Orders / Instructions o Follow Nutritious Diet and Increase Protein Intake Wound Treatment Wound #1 - Back Wound Laterality: Midline, Distal Cleanser: Normal Saline 1 x Per Day/30 Days Discharge Instructions:  Wash your hands with soap and water. Remove old dressing, discard into plastic bag and place into trash. Cleanse the wound with Normal Saline prior to applying a clean dressing using gauze sponges, not tissues or cotton balls. Do not scrub or use excessive force. Pat dry using gauze sponges, not tissue or cotton balls. Primary Dressing: Hydrofera Blue Rope 1 x Per Day/30 Days Discharge Instructions: cut into fourths; angle the end Secondary Dressing: (SILICONE BORDER) Zetuvit Plus SILICONE BORDER Dressing 4x4 (in/in) 1 x Per Day/30 Days Electronic Signature(s) Unsigned Entered ByYevonne Pax on 07/16/2022 13:39:41 Signature(s): Date(s): Bedingfield, Shatasha [name]. ([id number]) -------------------------------------------------------------------------------- Problem List Details Patient Name: [name], [name] Date of Service: 07/16/2022 1:15 PM Medical Record Number: [id number] Patient Account Number: [id number] Date of Birth/Sex: 25-Jul-1961 (61 y.o. 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 ([id number]) -------------------------------------------------------------------------------- Progress Note Details Patient Name: Michele, [name] Date of Service: 07/16/2022 1:15 PM Medical Record Number:  [id number] Patient Account Number: [id number] Date of Birth/Sex: 08/08/1961 (61 y.o. F) Treating RN: Yevonne Pax Primary Care Provider: [name] Other Clinician: Referring Provider: Card, John Treating Provider/Extender: Larina Bras,  Jake Samples in Treatment: 39 Subjective Chief Complaint Information obtained from Patient Surgical Back Ulcer History of Present Illness (HPI) 10/15/2021 upon evaluation today patient presents for initial evaluation here in the clinic concerning a surgical ulceration/dehiscence in the lumbar spine region following surgery that she had over the past year. This was actually broken up into 3 separate surgical events. The initial surgical intervention actually was on November 05, 2021 almost a year ago. Subsequently the patient went back in February for a seroma of the area which unfortunately required her to have a repeat surgery to go in and clean this out. And then again this occurred in April where she went back in and again they felt like stitches were coming out and there was an additional seroma. She was placed in a wound VAC initially and then subsequently as it got smaller that was discontinued. Again right now I will see anything that I think a wound VAC would help with. Nonetheless she definitely has a significant depth to the wound that is going require packing. I actually believe the Hydrofera Blue rope would probably do quite well with this the problem is as much as it is draining she probably needs this to be changed at least every day. She does not really have anyone that can help with that that is the complicating scenario here. With that being said the patient does have a history of multiple sclerosis, hypertension, and again this surgical wound dehiscence in regard to her lumbar spine region. She did have a repeat MRI which was actually completed 10/09/2021. This showed that there was no significant change in the subcutaneous fluid collection/track of the  lower lumbar region. This is extending to the level of the fascia unfortunately. This seems to go all the way from the L2 level with a track extending all the way to the fascia at the L4-5 level. Again this is a significant wound and there is significant drainage but does not seem to communicate to the spinal region as far as spinal fluid or otherwise is concerned that is good news. Nonetheless she last saw Dr. Franky Macho who is her neurosurgeon on 10/01/2021 that was when he ordered this last MRI she supposed to see him next week as well. With that being said he did not feel like there was any significant issue there but was not sure why this was not healing that is when he ordered the MRI. They were wanting to make sure that this was packed appropriately by home health unfortunately the main issue currently is that home health is completely out of the picture as the patient has exhausted all the home health that that she gets for a year. She is now in a very difficult predicament where she does not have anyone to help her change the dressing and to be honest that she is not able to do it herself with the location of the wound being on the midline lumbar spine region. If she does not have anyone that can help it is probably can to be necessary for her to go to a facility for rehab and daily dressing changes as I feel like daily changes which is much drainage that she is having is going to be necessary. 10/23/2021 upon evaluation today patient appears to be doing decently well in regard to her back ulcer. This does seem to be draining a lot less than what it is been doing in the past. With that being said she still has quite a  bit of drainage nonetheless. I do think that given time this should improve least I hope so. The good news is she does have home health coming out 3 days a week were doing it 2 days a week and she is paying someone we can to help. 10/30/2021 upon evaluation today patient appears to  be doing okay in regard to her back ulcer this is not draining quite as bad as it was in the beginning but he still has quite a bit of drainage noted. I do believe that the patient would benefit from [place] going ahead forward with attempting a wound VAC using the Hydrofera Blue rope to pack with and then subsequently using the VAC externally to actually suction out and help this to fill- in. I think this is our ideal way to try to get things cleared at this point. As it stands I am not certain that we are really making a progress that we want to see near with doing it in the way we are which is packing with the rope. It is a good dressing but I do think it is insufficient for total healing. She just seems to have too much in the way of drainage at this point unfortunately. 11/06/2021 upon evaluation today patient unfortunately continues to have issues with her back ongoing. The good news is her MRI that was repeated showed signs of the size of this area in the lumbar spine region having decreased from 4 cm to 3.5 cm this is definitely not bad news at all. With that being said unfortunately she continues to have issues with ongoing drainage not as severe as in the beginning but nonetheless still significant. I do think a wound VAC still would be a good way to go although her home health agency nurse apparently has some concerns about the possibility of not being able to keep a seal with this as they had struggles in the past. Nonetheless I explained to the patient that this is much different than what she had previous and that I really feel like it would do much better as far as getting the area taken care of without having any complications or issues here. I think that we should be able to maintain a seal. Nonetheless at this time I did discuss with the patient as well that she probably does need to have a wound VAC in order for [place] to get this moving in the right direction. 11/13/2021 upon evaluation  patient's wound bed actually showed signs of significant drainage at this time. She did see the surgeon yesterday he did not see anything that appeared to be infected. Nonetheless he does appear that she is continuing to have areas here that just do not seem to want to seal up there MRI findings have been negative but nonetheless she continues to have is the seroma that is filling in. I do feel like we need to try to widen the hole so we can get at least a half of the [facility] then this will be better than nothing at this point. 11/20/2021 upon evaluation today patient actually appears to be having less pain at this point which is good news and overall she we still do not have the results of the culture back yet it had to be sent out to [facility] and we do not have the result back yet. Is doing decently well in regard to her wound. Fortunately there does not appear to be any signs of active infection systemically  nor locally at this time. 11/27/2021 upon evaluation today patient appears to be doing well with regard to her wound all things considered there does appear to be less drainage than there was previous. Fortunately I do not see any evidence of worsening of the patient is stating that she is having some issues with back pain. This is somewhat new. Again this I think could be related to the fact that she is having some issues here with infection. We are still waiting to see what the result of her culture shows from susceptibility testing Enterococcus has been identified but we do not know if this is VRE or not. 12/04/2021 upon evaluation today patient appears to be doing well with regard to her wound all things considered. I did have a conversation with Erin from Dr. Sueanne Margarita office. Of note she notes that Dr. Drinda Butts really does not want to do anything surgical right now which I completely Kitchens, Sheyna [name]. ([id number]) understand. With that being said I am still leery of how far we will  make it getting this to heal short of any type of surgery to open this up and allow [place] to more appropriately packed the wound. Nonetheless I will absolutely give it our best shot as far as that is concerned. I discussed that with the patient today. She voiced understanding. The good news is the drainage today seems to be clear it is no longer cloudy as it was previous I am actually very pleased in that regard. 12/11/2021 since have last seen the patient actually did have an conversation with Dr. Franky Macho with her neurosurgeon. Again this involve the discussion around whether or not to open the wound and try to apply a wound VAC following. With that being said the decision was made that that may be the best thing to do if the patient was in agreement. Nonetheless I am actually extremely encouraged with what I am seeing today much more than I would have thought. In fact I think that we may be over packing the wound which is why she is having some discomfort at this point as there is much less of the dressing able to get and this time compared to what we saw previous. That is actually really good news. In fact the 6:00 tunnel appears to have filled in is awesome news. At the 12:00 location I am actually able to pack into that area pretty effectively at this point today. In fact I was able to get less of the packing in which I will detail below. 12/18/2021 upon evaluation today patient appears to be doing well with regard to her wound on the back. Fortunately there is no signs of active infection which is great news and overall very pleased with where things stand today. No fevers, chills, nausea, vomiting, or diarrhea. 01/01/2022 upon evaluation today patient appears to be doing decently well in regard to her wound. Fortunately there does not appear to be any signs of anything worsening which is great news. She still continues to have issues with drainage but this is not nearly as significant as what it was  in the past. There is all clear drainage no signs of purulence noted. 01/08/2022 upon evaluation today patient appears to be doing well with regard to her wound. With that being said she tells me that she has been having some increased pain over the past week. It does appear based on the amount of Hydrofera Blue that we removed this is probably over pack. Again  it is difficult to know exactly how old the nurses getting all the skin but nonetheless the length of Hydrofera Blue that was utilized was way too much which may be part of the reason why this felt so uncomfortable. Fortunately I think that we can cut back on that we discussed pieces smaller so hopefully that will not be over packed. 01/22/2021 upon evaluation today patient appears to be doing well With regard to there being no signs of infection at this time. The wound does still tunnel mainly up at the 12:00 location. The depth of the tunnel complete from entry point to the base is about 3.5 cm today. With that being said I do believe that overall she is making good progress this is just a very slow process for her which I know has been frustrating as well. 01/29/2022 upon evaluation today patient appears to be doing well with regard to the wound on her lower back and the lumbar spine region. The good news is the depth that I got this week was a little bit less going up at the 12:00 location as compared to last week. I measured this to be 3.5 cm last week and it was 3.3 cm this week. Again that is a minute shift but nonetheless a good shift in the right direction. Overall I think that we are on the right track. 02/05/2022 upon evaluation today patient appears to be doing well with regard to her wound. In fact right now her measurements are somewhere around 2.9 cm which is definitely smaller and less deep than last week At the 12:00 location. Overall I am very pleased with what we are seeing. 02/19/2022 upon evaluation today patient unfortunately is  noting that the wound is actually closing up externally but internally there is still some depth to this. I discussed with her today that we cannot let it close up like this is can end up being a significant issue for her if we allow for that. Subsequently we need to do what we can to try to open this up and keep it open more effectively. She voiced understanding. With that being said we are going to proceed with that procedure today in order to debride away some of the skin on externally that is trying to close and on this. 02/26/2022 upon evaluation patient appears to be doing better in my opinion after we had to open up this area last week. Again she has a significant amount of healing and overall I am extremely pleased with where things stand currently. I do not see any evidence of active infection locally nor systemically at this time which is great news and in general I think that we are on the right track for getting this hopefully closed final and done. 03/05/2022 upon evaluation today patient unfortunately is doing a little bit worse with regard to the whole size this is starting to get much smaller unfortunately. There does not appear to be any signs of active infection locally nor systemically which is great news. With that being said I am concerned about the fact that the patient is doing quite a bit worse with regard to trapping some fluid and almost feels like she may have a fluid pocket that not only goes in and up towards 12:00 but looks back around towards the more superficial subcutaneous tissue. I am very concerned that this is going to be something that is very hard for [place] to heal short of another surgery to open this up  and try to wound VAC from the inside out. Even then there is no guarantees this is just a very difficult situation to be perfectly honest. 03/14/2022 upon evaluation today patient appears to be doing well with regard to her wound as far as the area staying open and  pleased in that regard. With that being said unfortunately she is not doing as well when it comes to the irritation around it appears to be very inflamed and red this is not good that was discussed with her today as well. Subsequently I do believe that working to need to do what we can do to try and calm this down in the past she did well with the Augmentin due to Enterococcus infection I am not sure if were dealing with the same thing or not but at the same time I think that is probably to be my go to at this point. 03/19/2022 upon evaluation today patient appears to be doing really about the same in regard to her wound that the pain is better. 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 She seems to be improved dramatically with the antibiotic currently although it does not cover for MRSA I am apt to just see how this progresses over the next several days to a week. With that being said the bigger issue here is that we simply do not seem to be making excellent progress and I am concerned about the lack of progress. I discussed with Dr. Shon Baton with [facility] in [place] and to be honest his recommendation was that the best bet would probably be to get her to a teaching center. She is actually been seen for rheumatology and a I believe psychologist/psychiatrist at [facility] She would prefer to go that direction as opposed to [facility] or [facility]. I am definitely okay with that. 03/26/2022 upon evaluation today patient appears to be doing okay in regard to her wound. She is not showing signs of worsening and overall she tells me that her pain is not nearly as bad as it was previous. Fortunately I do not see any evidence of active infection locally or systemically which is great news. No fevers, chills, nausea, vomiting, or diarrhea. 04-02-2022 upon evaluation today patient appears to be doing well with regard to her wound. It does not appear to be showing any signs of worsening which is  great news. With that being said you are still having some issues here with drainage although it is clear and mainly just tenderness with bleeding from the [facility] I do think that treating for the second issue which is MRSA is probably the right thing to do at this point she is now off of the Augmentin. 04-09-2022 upon evaluation today patient's wound actually appears to be doing about the same. Fortunately I do not see any evidence of active infection locally or systemically at this time which is great news. No fevers, chills, nausea, vomiting, or diarrhea. [name], [name] ([id number]) 04-16-2022 upon evaluation today patient appears to be doing well with regard to her wound in the lumbar spine region. Subsequently she continues to have an area that does have a tunnel up into the 12:00 location. This is about 3.5 cm using a skinny probe curved upwards to get into this region. With that being said I really do not see any signs of overall improvement but also do not see any signs of worsening I feel like work on about a still made here to some  degree. The patient did see her neurologist and he has referred her to neurosurgery at [facility] for second opinion we will see what they have to say as well. 04-23-2022 upon evaluation today patient appears to be doing well with regard to her wound all things considered. She has been tolerating the dressing changes without complication. Fortunately I do not see any signs of active infection locally nor systemically which is great news. No fever chills noted 04-30-2022 upon evaluation today patient appears to be doing about the same in regard to her wound. The depth is really not dramatically improved as far as the 12:00 tunnel is concerned. The overall depth and the base of the wound does seem to be a little bit better it does sound like that external wound has closed as far as the opening is concerned we can have to cut this down from a half to a smaller  size based on what we are seeing today. 05-07-2022 upon evaluation today patient's wound actually seems to be doing decently well. There is not a major shift here but a slight shift in the depth both straight and as well as in the [place] at 12:00. With that being said I again we will talk about a couple of millimeters but still every little bit can count when there are situations like this to be honest. 05-14-2022 upon evaluation today patient appears to be doing okay in regard to her wound. I do not see any signs of anything worsening. With that being said also do not see getting significantly better unfortunately. There does not appear to be any evidence of infection locally or systemically which is great news. No fevers, chills, nausea, vomiting, or diarrhea. 05-21-2022 upon evaluation today patient actually appears to be doing quite well in regard to her wound from the standpoint of there being no infection. With that being said there does not appear to be any evidence of infection locally nor systemically which is great news. No fevers, chills, nausea, vomiting, or diarrhea. 05-28-2022 upon evaluation today patient's wound actually appears to be doing about the same. I do not see any evidence of active infection locally or systemically which is great news. No fevers, chills, nausea, vomiting, or diarrhea. 06-04-2022 upon evaluation today patient appears to be doing well with regard to her wound. She has been tolerating dressing changes without complication. Fortunately I do not feel like there is any worsening also I do not feel like there is any improvement. I feel like right a solid area here where she has a tunnel that tracks up at 12:00 and then has a fluid pocket at around roughly 1:00 I can push on this and squeeze fluid out. Again I am not exactly sure what else can be done from my standpoint to improve this. She does have an appointment with Dr. Franky Macho who I do believe is an excellent  surgeon and this is the surgeon who performed this surgery initially. Again the biggest issue was following she had a lot of trouble with the wound VAC I do think that we need to try to see if she is can have a wound VAC following surgery what exactly regular need to do to help keep this moving in the right direction for her. Obviously I want her to have the best result possible that she has to go through surgery again to clean this area out. Absolutely willing to help out with getting this to heal. 06-13-2022 upon evaluation today patient appears to be  doing well with regard to her wound its not any worse unfortunately it is also not significantly better which is the only issue currently. I do not see any signs of active infection locally or systemically which is great news. No fevers, chills, nausea, vomiting, or diarrhea. She does have her appointment on Monday with her neurosurgeon and we will see you Dr. Franky Macho has to say at that point. 06-18-2022 upon evaluation patient appears to be doing a little worse in regard to her wound only in the respect that has been several days since she has had this changed in fact it was last changed on Friday. Since that time she tells me that she has kept this in place over the weekend and yesterday she was supposed to see her neurosurgeon yesterday but they had to cancel due to an emergency surgery according to what the patient tells me. Nonetheless she is having some issues here with drainage coming from the wound that is more purulent in nature I think this is something we have been seeing in the [facility] for several weeks but is more obvious being that it was not changed as much during the last week. She unfortunately is a little worried about this but again I think that this is just part of what we have been dealing with all long is more prevalent and obvious due to the length of time since it was changed last. She does have a repeat appointment for I  believe July 10 she told me although she is going to see if she can find anything sooner. 06-25-2022 upon evaluation today patient's wound actually showed signs of marginal improvement which is good news. She still has the area of abscess that seems to be at roughly 2:00 when looking at her back. We can get into this with the skinny probe other than that I really do not know what else I can do to try to make this better. We discussed this and she does have an appointment with her neurosurgeon although that is not till mid July as he had to change it at the last moment during the time she was post to see him a week ago. She does have evidence of potential infection on culture I am going to go ahead and treat this in order to ensure that nothing worsens. With that being said I do believe that overall she seems to be doing well but I do think we want to make sure that that the knee gets worse in the meantime until she gets to see her neurosurgeon. 07-01-2022 upon evaluation today patient appears to continue to have trouble with her wound draining. Again we have been continue to monitor this and I still see the same issue that I have noted before the patient has an area which is draining seemingly from around the 2:00 location when you go up towards 12:00 and then over towards 2:00 looking at her back this is where there is actually a soft area external that if you push on it you will see fluid come out and subsequently this is also where it tracks to internally. Everything in the 6:00 location has completely healed in and this looks much better in that regard but this upper portion we just cannot get to closed with the methods that we have. It seemed to me that this is probably going to require that this area be open and cleaned out in order to allow it to heal and my suggestion would  be that a wound VAC is probably going to be the ideal thing. 07-09-2022 upon evaluation today patient appears to be doing  somewhat poorly in regard to her wound. She did see Dr. Franky Macho. He stated that the wound was not being "packed appropriately" according to the patient and her daughter who were present during the evaluation today as well as the evaluation with him yesterday. With that being said he packed the wound the way he said it needed to be and to be partly honest that is over packed. The patient is having a tremendous amount of discomfort and states that this is no way that she is good to be able to tolerate that. I understand the concern about the whole closing down which is why I recommended trying to keep this open is much as possible and again with that all being said this is still going to continue to be an issue as far as this closing down before everything heals up on the inside. Nonetheless she continues to have issues with significant drainage. She also has an area which almost feels like a small abscess or least of fluid pocket that feels up that was not appropriately packed with the dressings that were in place. With that being said I think that this is going to have to be opened at some point in time in order to get this to heal I am really not certain the best way to go about this. I discussed that with the patient again today. DERRA, [name] ([id number]) 07-16-2022 upon evaluation today patient appears to be doing well currently in regard to her wound from the standpoint of the pain getting better which is great news. Fortunately I do not see any evidence of active infection locally or systemically which is great news. No fevers, chills, nausea, vomiting, or diarrhea. With that being said the patient has been tolerating the dressing changes better without complication which is great news. Objective Constitutional Well-nourished and well-hydrated in no acute distress. Vitals Time Taken: 1:24 PM, Height: 58 in, Weight: 275 lbs, BMI: 57.5, Temperature: 98.2 F, Pulse: 80 bpm, Respiratory Rate:  18 breaths/min, Blood Pressure: 120/82 mmHg. Respiratory normal breathing without difficulty. Psychiatric this patient is able to make decisions and demonstrates good insight into disease process. Alert and Oriented x 3. pleasant and cooperative. General Notes: Patient's wound bed showed signs of good granulation and epithelization at this point. Fortunately I do not see any evidence of active infection locally or systemically which is great news and overall I am extremely pleased with where things stand currently. Integumentary (Hair, Skin) Wound #1 status is Open. Original cause of wound was Surgical Injury. The date acquired was: 04/11/2021. The wound has been in treatment 39 weeks. The wound is located on the Distal,Midline Back. The wound measures 0.3cm length x 0.3cm width x 2.5cm depth; 0.071cm^2 area and 0.177cm^3 volume. There is Fat Layer (Subcutaneous Tissue) exposed. There is no tunneling or undermining noted. There is a medium amount of serosanguineous drainage noted. There is large (67-100%) red granulation within the wound bed. There is no necrotic tissue within the wound bed. 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 Nurse Visit as needed Home Health: Home Health Company: - [facility] for wound care. May utilize formulary equivalent dressing for wound treatment orders unless  otherwise specified. Home Health Nurse may visit PRN to address patient s wound care needs. - Monday and Friday [facility] fax [phone] Bathing/ Shower/ Hygiene: May shower; gently cleanse wound with antibacterial soap, rinse and pat dry prior to dressing wounds No tub bath. Anesthetic (Use 'Patient Medications' Section for  Anesthetic Order Entry): Lidocaine applied to wound bed Edema Control - Lymphedema / Segmental Compressive Device / Other: Elevate, Exercise Daily and Avoid Standing for Long Periods of Time. Elevate legs to the level of the heart and pump ankles as often as possible Elevate leg(s) parallel to the floor when sitting. Roger, [name] ([id number]) Additional Orders / Instructions: Follow Nutritious Diet and Increase Protein Intake WOUND #1: - Back Wound Laterality: Midline, Distal Cleanser: Normal Saline 1 x Per Day/30 Days Discharge Instructions: Wash your hands with soap and water. Remove old dressing, discard into plastic bag and place into trash. Cleanse the wound with Normal Saline prior to applying a clean dressing using gauze sponges, not tissues or cotton balls. Do not scrub or use excessive force. Pat dry using gauze sponges, not tissue or cotton balls. Primary Dressing: Hydrofera Blue Rope 1 x Per Day/30 Days Discharge Instructions: cut into fourths; angle the end Secondary Dressing: (SILICONE BORDER) Zetuvit Plus SILICONE BORDER Dressing 4x4 (in/in) 1 x Per Day/30 Days 1. I would recommend currently that we continue with the Hydrofera Blue rope I think that still probably the best option here for the patient. 2. I am also can recommend that we have the patient continue to monitor for any signs of worsening infection the good news is there is less drainage today than what we have been seeing that is excellent. We will see patient back for reevaluation in 1 week here in the clinic. If anything worsens or changes patient will contact our office for additional recommendations. Electronic Signature(s) Signed: 07/16/2022 1:43:06 PM By: [name] PA-[name] Entered By: [name] on 07/16/2022 13:43:06 Mcswain, Domingo Pulse ([id number]) -------------------------------------------------------------------------------- SuperBill Details Patient Name: Guthmiller, [name] Date of  Service: 07/16/2022 Medical Record Number: [id number] Patient Account Number: [id number] Date of Birth/Sex: 1961-08-21 (61 y.o. 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 G35 Multiple sclerosis I10 Essential (primary) hypertension Facility Procedures CPT4 Code: [id number] Description: [phone] - WOUND CARE VISIT-LEV 2 EST PT Modifier: Quantity: 1 Physician Procedures CPT4 Code: [id number] Description: [id number] - WC PHYS LEVEL 3 - EST PT Modifier: Quantity: 1 CPT4 Code: Description: ICD-10 Diagnosis Description T81.31XA Disruption of external operation (surgical) wound, not elsewhere classifi 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

## 2022-07-18 DIAGNOSIS — L98422 Non-pressure chronic ulcer of back with fat layer exposed: Secondary | ICD-10-CM | POA: Diagnosis not present

## 2022-07-19 ENCOUNTER — Ambulatory Visit: Payer: 59 | Admitting: Occupational Therapy

## 2022-07-19 DIAGNOSIS — I890 Lymphedema, not elsewhere classified: Secondary | ICD-10-CM

## 2022-07-19 NOTE — Therapy (Signed)
OUTPATIENT OCCUPATIONAL THERAPY TREATMENT NOTE and Progress Report   Patient Name: [name] MRN: [id number] DOB:05/25/1961, 61 y.o., female Today's Date: 07/19/2022 Reporting Period: 06/07/22 - 07/19/22  PCP: Kennon Portela, MD REFERRING PROVIDER: Elisha Headland, FNP   OT End of Session - 07/19/22 1112     Visit Number 27    Number of Visits 36    Date for OT Re-Evaluation 08/12/22    OT Start Time 1105   OT Stop Time 1200\   OT Time Calculation (min) 62mn    Activity Tolerance Patient tolerated treatment well;No increased pain    Behavior During Therapy WFL for tasks assessed/performed             Past Medical History:  Diagnosis Date   Arthritis    Back pain    Chronic knee pain    Edema of both lower extremities    High blood pressure    High cholesterol    Joint pain    [place]    Neuromuscular disorder (HCC)    Multiple Sclerosis over 20 years   Obesity    Vitamin D deficiency    Past Surgical History:  Procedure Laterality Date   APPENDECTOMY     CESAREAN SECTION     HAND SURGERY     lumbar back surgery     LUMBAR WOUND DEBRIDEMENT N/A 12/05/2020   Procedure: Lumbar Wound Exploration;  Surgeon: CAshok Pall MD;  [facility]  Service: Neurosurgery;  Laterality: N/A;  3C/RM 21   LUMBAR WOUND DEBRIDEMENT N/A 02/21/2021   Procedure: Lumbar wound exploration;  Surgeon: CAshok Pall MD;  [place]  Service: Neurosurgery;  Laterality: N/A;  posterior   LUMBAR WOUND DEBRIDEMENT N/A 04/11/2021   Procedure: Wound Exploration with Wound Vac Placement;  Surgeon: CAshok Pall MD;  [place] [facility]  Service: Neurosurgery;  Laterality: N/A;   ROTATOR CUFF REPAIR     TONSILLECTOMY     Patient Active Problem List   Diagnosis Date Noted   Postoperative seroma of subcutaneous tissue after non-dermatologic procedure 04/11/2021   Wound drainage 02/21/2021   Wound dehiscence 02/21/2021   Postoperative seroma of musculoskeletal  structure after musculoskeletal procedure 12/05/2020   Spondylolisthesis of lumbar region 11/02/2020   Insulin resistance 02/15/2020   Class 3 severe obesity with serious comorbidity and body mass index (BMI) of 45.0 to 49.9 in adult ([facility] 01/19/2020   Vitamin D deficiency 01/18/2020   Other hyperlipidemia 02/22/2019   Primary osteoarthritis of both knees 10/14/2018   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   Essential hypertension 02/16/2016   [place] 04/06/[place]    ONSET DATE: 02/14/22   REFERRING DIAG: I89.0 Lymphedema  THERAPY DIAG:  Lymphedema, not elsewhere classified  Rationale for Evaluation and Treatment Rehabilitation  PERTINENT HISTORY: B knee OA, HTN, Obesity, MS, Spondylolisthesis-lumbar, open wound at lumbar surgical site since 4/22.   LIMITATIONS: difficulty walking, impaired transfers, chronic OA pain in bilateral knees, non healing post surgical lumbar wound, BLE muscle weakness 2/2 MS, chronic leg swelling with minimal pain,   OUTCOME MEASURE: FOTO score on intake = 31/100% . End of intensive: 42/100%. 11 POINT INCREASE IN FUNCTION.  PRECAUTIONS: Lymphedema precautions, Fall risk  SUBJECTIVE: Ms. ERosemanwas last seen for OT Lymphedema care on  07/03/22. She missed 3 SCHEDULED ot SESSIONS  due to vacation and wound -related pain in her back.  Pt presents with BLE custom compression stockings in place. Pt denies lymphedema related pain. She reports she is happy  with the custom compression stocking fitted last visit.  PAIN:  Are you having pain? No   OBJECTIVE:   TODAY'S TREATMENT:  LLE/LLQ MLD as established Skin care throughout manual therapy to increase skin hydration and mobility and to limit infection risk Ending FOTO assessment    PATIENT EDUCATION: Education details: Continued Pt/ CG edu for lymphedema self care home program throughout session. Topics include outcome of comparative limb volumetrics- starting limb volume differentials (LVDs), technology  and gradient techniques used for short stretch, multilayer compression wrapping, simple self-MLD, therapeutic lymphatic pumping exercises, skin/nail care, LE precautions,. compression garment recommendations and specifications, wear and care schedule and compression garment donning / doffing w assistive devices. Discussed progress towards all OT goals since commencing CDT. All questions answered to the Pt's satisfaction. Good return. Person educated: Patient Education method: Customer service manager Education comprehension: verbalized understanding and returned demonstration   HOME EXERCISE PROGRAM Lymphatic pumping therex- BLE , 10 reps each item, in order, 1-2 x daily. MS-related muscle weakness limits Pt's ability to effectively perform therex on L     OT Long Term Goals - 05/20/22 1312       OT LONG TERM GOAL #1   Title Given this patient's Intake score 31 /100 on the functional outcomes FOTO tool, patient will experience an increase in function of 5 points to improve basic and instrumental ADLs performance, including lymphedema self-care.    Baseline 31/100  07/19/22: 42/100%. GOAL MET with 11 pint increase in function.   Period Weeks    Status GOAL MET with 11 pint increase in function.   Target Date      OT LONG TERM GOAL #2   Title Pt will demonstrate understanding of lymphedema prevention strategies by identifying and discussing 5 precautions using printed reference (modified assistance) to reduce risk of progression and to limit infection risk.    Baseline Max A    Time 4    Period Days    Status Achieved    Target Date      OT LONG TERM GOAL #3   Title With Maximum caregiver assistance Pt will be able to apply multilayer, LUE compression wraps using gradient techniques to decrease limb volume, to limit infection risk, and to limit lymphedema progression. Pt unable to wrap legs independently. Trained caregiver must assist during visit intervals for CDT to be effective.     Baseline dependent - Max caregiver assistance is necessary to complete Intensive Phase of Complete Decongestive Therapy and Lymphedema self-care home program  as Pt is unable to reach his feet to apply compression wraps and garments, to inspect skin, to groom nails to perform skin care and inspection and to perform simple self-MLD. Pt agrees to arrange for consistent caregiver prior to commencing CDT.    Time 4    Period Days    Status GOAL MET      Target Date --   4th OT Rx visit     OT LONG TERM GOAL #4   Title With Maximum caregiver assistance between OT sessions Pt will achieve at least a 10% limb volume reduction below the knee bilaterally to return limb to more normal size and shape, to limit infection risk, to decrease pain, to improve function, and to limit lymphedema progression.   Pt has achieved 8% voume reduction to date , which is excellent progress towards goals.   Baseline dependent    Time 12    Period Weeks    Status Partially Met   RLE  A-D volume is reduced by 8.5% since commencing CDT on 3/l/23. LLE is reduced in volume by 5.4% to date. Pt has achieved these reductions without CG assistance.   Target Date      OT LONG TERM GOAL #5   Title With caregiver assistance  Pt will achieve and sustain a least 85% compliance with all 4 LE self-care home program components throughout Intensive Phase CDT, including modified simple self-MLD, daily skin inspection and care, lymphatic pumping the ex, 23/7 compression wraps to optimize limb volume reductions, to limit lymphedema progression and to limit further functional decline.    Baseline Dependent    Time 12    Period Weeks    Status Achieved   Goal met and exceeded. Pt remains > 85% compliant with LE home program   with modified independence (extra time).   Target Date              Plan - 07/03/22 1209     Clinical Impression Statement  Pt managed lymphedema very well during 3 week visit interval , despite very hot summer  temperatures and worsening pain with back non-healing wound. Pt tolerated LLE MLD as established without increased pain. She is pleased with RLE custom garment fitted last visit. Both garments are containing and controlling leg swelling very well. Pt has met all OT goals for lymphedema care except for the 10 % bilateral limb volume reduction goal below the knees. Did did, however, achieve substantial reduction in both legs. Please review LONG TERM GOALS section for detailed progress to date. Pt agrees with plan to return for follow along     support  for chronic , progressive lymphedema in 6-8 weeks, and to call PRN. It has been a pleasure providing care to Ms. Tonelli and she has successfully completed the Intensive Phase of CDT and transitions to the Self-Management Phase this date.   OT Occupational Profile and History Detailed Assessment- Review of Records and additional review of physical, cognitive, psychosocial history related to current functional performance    Occupational performance deficits (Please refer to evaluation for details): ADL's;IADL's;Rest and Sleep;Leisure;Social Participation;Work    Writer Several treatment options, min-mod task modification necessary    Comorbidities Affecting Occupational Performance: May have comorbidities impacting occupational performance    Modification or Assistance to Complete Evaluation  Min-Moderate modification of tasks or assist with assess necessary to complete eval    OT Frequency 2x / week    OT Duration 12 weeks    OT Treatment/Interventions Self-care/ADL training;DME and/or AE instruction;Manual lymph drainage;Compression bandaging;Therapeutic activities;Coping strategies training;Therapeutic exercise;Other (comment);Manual Therapy;Energy conservation;Patient/family education   skin care, Flexitouch trial, fit with appropriate compression garments that are comfortable, effective and that Pt is able to don and  doff using assistive devices PRN   Recommended Other Services " Calcium channel blockers may not directly impair lymphatic function. However our results show that a reduced lymphatic function predisposes to CBC edema, which may explain why some patients develop edema during treatment." [employer], Niklas Telinius, Claiborne Billings, and Vibeke Hjortdal.  Reduced Lymphatic Function Predisposes to Calcium Channel Blocker Edema: A Randomized Placebo-Controlled Clinical Trial.  Lymphatic Research and Biology.Apr [email or web address]://[email or web address]/lrb.[email or web address]  Published in Volume: 18 Issue 2: April 16, 2019  Online Ahead of Print:August 18, 2018    Consulted and Agree with Plan of Care Patient           Andrey Spearman, MS, OTR/L, [facility] 07/19/22 12:17 PM

## 2022-07-23 ENCOUNTER — Encounter: Payer: 59 | Admitting: Internal Medicine

## 2022-07-23 NOTE — Progress Notes (Signed)
LATEISHA, THURLOW ([id number]) Visit Report for 07/18/2022 Physician Orders Details Patient Name: Cartier, Fonda C. Date of Service: 07/18/2022 3:30 PM Medical Record Number: [id number] Patient Account Number: [id number] Date of Birth/Sex: June 02, 1961 (60 y.o. F) Treating RN: Yevonne Pax Primary Care Provider: [name] Other Clinician: Referring Provider: Card, John Treating Provider/Extender: Rowan Blase in Treatment: 9 Verbal / Phone Orders: No Diagnosis Coding Follow-up Appointments o Return Appointment in 1 week. o Nurse Visit as needed Home Health o Home Health Company: [facility] o [facility] for wound care. May utilize formulary equivalent dressing for wound treatment orders unless otherwise specified. Home Health Nurse may visit PRN to address patientos wound care needs. - Monday and Friday [facility] fax [phone] Bathing/ Shower/ Hygiene o May shower; gently cleanse wound with antibacterial soap, rinse and pat dry prior to dressing wounds o No tub bath. Anesthetic (Use 'Patient Medications' Section for Anesthetic Order Entry) o Lidocaine applied to wound bed Edema Control - Lymphedema / Segmental Compressive Device / Other o Elevate, Exercise Daily and Avoid Standing for Long Periods of Time. o Elevate legs to the level of the heart and pump ankles as often as possible o Elevate leg(s) parallel to the floor when sitting. Additional Orders / Instructions o Follow Nutritious Diet and Increase Protein Intake Wound Treatment Wound #1 - Back Wound Laterality: Midline, Distal Cleanser: Normal Saline 1 x Per Day/30 Days Discharge Instructions: Wash your hands with soap and water. Remove old dressing, discard into plastic bag and place into trash. Cleanse the wound with Normal Saline prior to applying a clean dressing using gauze sponges, not tissues or cotton balls. Do not scrub or use excessive force. Pat dry using gauze sponges, not  tissue or cotton balls. Primary Dressing: Hydrofera Blue Rope 1 x Per Day/30 Days Discharge Instructions: cut into fourths; angle the end Secondary Dressing: (SILICONE BORDER) Zetuvit Plus SILICONE BORDER Dressing 4x4 (in/in) 1 x Per Day/30 Days Electronic Signature(s) Signed: 07/18/2022 5:07:46 PM By: [name] PA-C Signed: 07/23/2022 3:54:37 PM By: Yevonne Pax RN Entered By: Yevonne Pax on 07/18/2022 15:55:39 Genson, Berdena C. ([id number]) -------------------------------------------------------------------------------- SuperBill Details Patient Name: Dowdy, Teara C. Date of Service: 07/18/2022 Medical Record Number: [id number] Patient Account Number: [id number] Date of Birth/Sex: [birth year] (61 y.o. 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

## 2022-07-23 NOTE — Progress Notes (Addendum)
HARRIETTA, [name] ([id number]) Visit Report for 07/18/2022 Arrival Information Details Patient Name: [name], [name] Date of Service: 07/18/2022 3:30 PM Medical Record Number: [id number] Patient Account Number: [id number] Date of Birth/Sex: 1961/09/05 (61 y.o. F) Treating RN: Yevonne Pax Primary Care Jamien Casanova: Card, Jonny Ruiz Other Clinician: Referring Keaisha Sublette: Card, John Treating Ledarius Leeson/Extender: Rowan Blase in Treatment: 39 Visit Information History Since Last Visit All ordered tests and consults were completed: No Patient Arrived: [name] Added or deleted any medications: No Arrival Time: 15:50 Any new allergies or adverse reactions: No Accompanied By: self Had a fall or experienced change in No Transfer Assistance: None activities of daily living that may affect Patient Identification Verified: Yes risk of falls: Secondary Verification Process Completed: Yes Signs or symptoms of abuse/neglect since last visito No Patient Requires Transmission-Based Precautions: No Hospitalized since last visit: No Patient Has Alerts: No Implantable device outside of the clinic excluding No cellular tissue based products placed in the center since last visit: Has Dressing in Place as Prescribed: Yes Pain Present Now: No Electronic Signature(s) Signed: 07/23/2022 3:54:37 PM By: Yevonne Pax RN Entered By: Yevonne Pax on 07/18/2022 15:54:46 [name], [name] ([id number]) -------------------------------------------------------------------------------- Clinic Level of Care Assessment Details Patient Name: [name], [name] Date of Service: 07/18/2022 3:30 PM Medical Record Number: [id number] Patient Account Number: [id number] Date of Birth/Sex: 1961-08-11 (61 y.o. F) Treating RN: Yevonne Pax Primary Care Yliana Gravois: Card, Jonny Ruiz Other Clinician: Referring Wenceslaus Gist: Card, John Treating Berklie Dethlefs/Extender: Rowan Blase in Treatment: 39 Clinic Level of Care Assessment Items TOOL 4  Quantity Score []  - Use when only an EandM is performed on FOLLOW-UP visit 0 ASSESSMENTS - Nursing Assessment / Reassessment X - Reassessment of Co-morbidities (includes updates in patient status) 1 10 X- 1 5 Reassessment of Adherence to Treatment Plan ASSESSMENTS - Wound and Skin Assessment / Reassessment X - Simple Wound Assessment / Reassessment - one wound 1 5 []  - 0 Complex Wound Assessment / Reassessment - multiple wounds []  - 0 Dermatologic / Skin Assessment (not related to wound area) ASSESSMENTS - Focused Assessment []  - Circumferential Edema Measurements - multi extremities 0 []  - 0 Nutritional Assessment / Counseling / Intervention []  - 0 Lower Extremity Assessment (monofilament, tuning fork, pulses) []  - 0 Peripheral Arterial Disease Assessment (using hand held doppler) ASSESSMENTS - Ostomy and/or Continence Assessment and Care []  - Incontinence Assessment and Management 0 []  - 0 Ostomy Care Assessment and Management (repouching, etc.) PROCESS - Coordination of Care X - Simple Patient / Family Education for ongoing care 1 15 []  - 0 Complex (extensive) Patient / Family Education for ongoing care []  - 0 Staff obtains , Records, Test Results / Process Orders []  - 0 Staff telephones HHA, Nursing Homes / Clarify orders / etc []  - 0 Routine Transfer to another Facility (non-emergent condition) []  - 0 Routine Hospital Admission (non-emergent condition) []  - 0 New Admissions / / Ordering NPWT, Apligraf, etc. []  - 0 Emergency Hospital Admission (emergent condition) X- 1 10 Simple Discharge Coordination []  - 0 Complex (extensive) Discharge Coordination PROCESS - Special Needs []  - Pediatric / Minor Patient Management 0 []  - 0 Isolation Patient Management []  - 0 Hearing / Language / Visual special needs []  - 0 Assessment of Community assistance (transportation, D/[name] planning, etc.) []  - 0 Additional assistance / Altered  mentation []  - 0 Support Surface(s) Assessment (bed, cushion, seat, etc.) INTERVENTIONS - Wound Cleansing / Measurement [name], [name] ( ) X- 1 5 Simple Wound Cleansing - one  wound []  - 0 Complex Wound Cleansing - multiple wounds []  - 0 Wound Imaging (photographs - any number of wounds) []  - 0 Wound Tracing (instead of photographs) []  - 0 Simple Wound Measurement - one wound []  - 0 Complex Wound Measurement - multiple wounds INTERVENTIONS - Wound Dressings X - Small Wound Dressing one or multiple wounds 1 10 []  - 0 Medium Wound Dressing one or multiple wounds []  - 0 Large Wound Dressing one or multiple wounds []  - 0 Application of Medications - topical []  - 0 Application of Medications - injection INTERVENTIONS - Miscellaneous []  - External ear exam 0 []  - 0 Specimen Collection (cultures, biopsies, blood, body fluids, etc.) []  - 0 Specimen(s) / Culture(s) sent or taken to Lab for analysis []  - 0 Patient Transfer (multiple staff / / Similar devices) []  - 0 Simple Staple / Suture removal (25 or less) []  - 0 Complex Staple / Suture removal (26 or more) []  - 0 Hypo / Hyperglycemic Management (close monitor of Blood Glucose) []  - 0 Ankle / Brachial Index (ABI) - do not check if billed separately X- 1 5 Vital Signs Has the patient been seen at the hospital within the last three years: Yes Total Score: 65 Level Of Care: New/Established - Level 2 Electronic Signature(s) Signed: 07/23/2022 3:54:37 PM By: RN Entered By: on 07/18/2022 15:57:03 [name], [name] ( ) -------------------------------------------------------------------------------- Encounter Discharge Information Details Patient Name: [name], [name] Date of Service: 07/18/2022 3:30 PM Medical Record Number: Patient Account Number: Date of Birth/Sex: 09-27-1961 (61 y.o. F) Treating RN: Primary Care Micca Matura:  Nurse, adult Other Clinician: Referring Kyler Lerette: Card, John Treating Leea Rambeau/Extender: in Treatment: 36 Encounter Discharge Information Items Discharge Condition: Stable Ambulatory Status: Walker Discharge Destination: Home Transportation: Private Auto Accompanied By: self Schedule Follow-up Appointment: Yes Clinical Summary of Care: Patient Declined Electronic Signature(s) Signed: 07/23/2022 3:54:37 PM By: RN Entered By: 07/25/2022 on 07/18/2022 15:56:11 [name], [name] (Yevonne Pax) -------------------------------------------------------------------------------- Wound Assessment Details Patient Name: [name], [name] Date of Service: 07/18/2022 3:30 PM Medical Record Number: Marland Kitchen Patient Account Number: [id number] Date of Birth/Sex: Aug 02, 1961 (61 y.o. F) Treating RN: [id number] Primary Care Dysen Edmondson: Card, 09/11/1961 Other Clinician: Referring Orris Perin: Card, John Treating Micalah Cabezas/Extender: 62 in Treatment: 39 Wound Status Wound Number: 1 Primary Etiology: Dehisced Wound Wound Location: Distal, Midline Back Wound Status: Open Wounding Event: Surgical Injury Comorbid History: Hypertension Date Acquired: 04/11/2021 Weeks Of Treatment: 39 Clustered Wound: No Wound Measurements Length: (cm) 0.3 Width: (cm) 0.3 Depth: (cm) 2.5 Area: (cm) 0.071 Volume: (cm) 0.177 % Reduction in Area: 43.7% % Reduction in Volume: 69.4% Epithelialization: None Tunneling: No Undermining: Yes Starting Position (o'clock): 9 Ending Position (o'clock): 3 Maximum Distance: (cm) 3.5 Wound Description Classification: Full Thickness Without Exposed Support Structu Exudate Amount: Medium Exudate Type: Serosanguineous Exudate Color: red, brown res Foul Odor After Cleansing: No Slough/Fibrino No Wound Bed Granulation Amount: Large (67-100%) Exposed Structure Granulation Quality: Red Fascia Exposed: No Necrotic Amount: None Present  (0%) Fat Layer (Subcutaneous Tissue) Exposed: Yes Tendon Exposed: No Muscle Exposed: No Joint Exposed: No Bone Exposed: No Electronic Signature(s) Signed: 08/05/2022 2:29:23 PM By: 07/25/2022 RN Previous Signature: 07/23/2022 3:54:37 PM Version By: Yevonne Pax RN Entered By: 07/20/2022 on 08/05/2022 14:29:23

## 2022-07-25 ENCOUNTER — Encounter: Payer: 59 | Admitting: Occupational Therapy

## 2022-07-29 ENCOUNTER — Encounter: Payer: 59 | Admitting: Physician Assistant

## 2022-07-29 DIAGNOSIS — L98422 Non-pressure chronic ulcer of back with fat layer exposed: Secondary | ICD-10-CM | POA: Diagnosis not present

## 2022-07-29 NOTE — Progress Notes (Addendum)
[name], [name] ([id number]) Visit Report for 07/29/2022 Chief Complaint Document Details Patient Name: [name], [name] Date of Service: 07/29/2022 3:30 PM Medical Record Number: [id number] Patient Account Number: [id number] Date of Birth/Sex: Sep 16, 1961 (61 y.o. F) Treating RN: Yevonne Pax Primary Care Provider: [name] Other Clinician: Referring Provider: Card, John Treating Provider/Extender: Rowan Blase in Treatment: 90 Information Obtained from: Patient Chief Complaint Surgical Back Ulcer Electronic Signature(s) Signed: 07/29/2022 3:58:39 PM By: [name] PA-C Entered By: [name] on 07/29/2022 15:58:38 Bechler, Pheobe Salena Saner ([id number]) -------------------------------------------------------------------------------- HPI Details Patient Name: [name], [name] Date of Service: 07/29/2022 3:30 PM Medical Record Number: [id number] Patient Account Number: [id number] Date of Birth/Sex: [birth year] (61 y.o. 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 This was actually broken up into 3 separate surgical events. The initial surgical intervention actually was on November 05, 2021 almost a year ago. Subsequently the patient went back in February for a seroma of the area which unfortunately required her to have a repeat surgery to go in and clean this out. And then again this occurred in April where she went back in and again they felt like stitches were coming out and there was an additional seroma. She was placed in a wound VAC initially and then subsequently as it got smaller that  was discontinued. Again right now I will see anything that I think a wound VAC would help with. Nonetheless she definitely has a significant depth to the wound that is going require packing. I actually believe the Hydrofera Blue rope would probably do quite well with this the problem is as much as it is draining she probably needs this to be changed at least every day. She does not really have anyone that can help with that that is the complicating scenario here. With that being said the patient does have a history of multiple sclerosis, hypertension, and again this surgical wound dehiscence in regard to her lumbar spine region. She did have a repeat MRI which was actually completed 10/09/2021. This showed that there was no significant change in the subcutaneous fluid collection/track of the lower lumbar region. This is extending to the level of the fascia unfortunately. This seems to go all the way from the L2 level with a track extending all the way to the fascia at the L4-5 level. Again this is a significant wound and there is significant drainage but does not seem to communicate to the spinal region as far as spinal fluid or otherwise is concerned that is good news. Nonetheless she last saw Dr. Franky Macho who is her neurosurgeon on 10/01/2021 that was when he ordered this last MRI she supposed to see him next week as well. With that being said he did not feel like there was any significant issue there but was not sure why this was not healing that is when he ordered the MRI. They were wanting to make sure that this was packed appropriately by home health unfortunately the main issue currently is that home health is completely out of the picture as the patient has exhausted all the home health that that she gets for a year. She is now in a very difficult  predicament where she does not have anyone to help her change the dressing and to be honest that she is not able to do it herself with the location of  the wound being on the midline lumbar spine region. If she does not have anyone that can help it is probably can to be necessary for her to go to a facility for rehab and daily dressing changes as I feel like daily changes which is much drainage that she is having is going to be necessary. 10/23/2021 upon evaluation today patient appears to be doing decently well in regard to her back ulcer. This does seem to be draining a lot less than what it is been doing in the past. With that being said she still has quite a bit of drainage nonetheless. I do think that given time this should improve least I hope so. The good news is she does have home health coming out 3 days a week were doing it 2 days a week and she is paying someone we can to help. 10/30/2021 upon evaluation today patient appears to be doing okay in regard to her back ulcer this is not draining quite as bad as it was in the beginning but he still has quite a bit of drainage noted. I do believe that the patient would benefit from [place] going ahead forward with attempting a wound VAC using the Hydrofera Blue rope to pack with and then subsequently using the VAC externally to actually suction out and help this to fill- in. I think this is our ideal way to try to get things cleared at this point. As it stands I am not certain that we are really making a progress that we want to see near with doing it in the way we are which is packing with the rope. It is a good dressing but I do think it is insufficient for total healing. She just seems to have too much in the way of drainage at this point unfortunately. 11/06/2021 upon evaluation today patient unfortunately continues to have issues with her back ongoing. The good news is her MRI that was repeated showed signs of the size of this area in the lumbar spine region having decreased from 4 cm to 3.5 cm this is definitely not bad news at all. With that being said unfortunately she continues to have issues  with ongoing drainage not as severe as in the beginning but nonetheless still significant. I do think a wound VAC still would be a good way to go although her home health agency nurse apparently has some concerns about the possibility of not being able to keep a seal with this as they had struggles in the past. Nonetheless I explained to the patient that this is much different than what she had previous and that I really feel like it would do much better as far as getting the area taken care of without having any complications or issues here. I think that we should be able to maintain a seal. Nonetheless at this time I did discuss with the patient as well that she probably does need to have a wound VAC in order for [place] to get this moving in the right direction. 11/13/2021 upon evaluation patient's wound bed actually showed signs of significant drainage at this time. She did see the surgeon yesterday he did not see anything that appeared to be infected. Nonetheless he does appear that she is continuing to have areas here that just do not seem  to want to seal up there MRI findings have been negative but nonetheless she continues to have is the seroma that is filling in. I do feel like we need to try to widen the hole so we can get at least a half of the [facility] then this will be better than nothing at this point. 11/20/2021 upon evaluation today patient actually appears to be having less pain at this point which is good news and overall she we still do not have the results of the culture back yet it had to be sent out to [facility] and we do not have the result back yet. Is doing decently well in regard to her wound. Fortunately there does not appear to be any signs of active infection systemically nor locally at this time. 11/27/2021 upon evaluation today patient appears to be doing well with regard to her wound all things considered there does appear to be less drainage than there was previous.  Fortunately I do not see any evidence of worsening of the patient is stating that she is having some issues with back pain. This is somewhat new. Again this I think could be related to the fact that she is having some issues here with infection. We are still waiting to see what the result of her culture shows from susceptibility testing Enterococcus has been identified but we do not know if this is VRE or not. 12/04/2021 upon evaluation today patient appears to be doing well with regard to her wound all things considered. I did have a conversation with Erin from Dr. Sueanne Margarita office. Of note she notes that Dr. Drinda Butts really does not want to do anything surgical right now which I completely understand. With that being said I am still leery of how far we will make it getting this to heal short of any type of surgery to open this up and allow [place] to more appropriately packed the wound. Nonetheless I will absolutely give it our best shot as far as that is concerned. I discussed that with the patient today. She voiced understanding. The good news is the drainage today seems to be clear it is no longer cloudy as it was previous I am actually very pleased in that regard. SHAQUANTA, HARKLESS ([id number]) 12/11/2021 since have last seen the patient actually did have an conversation with Dr. Franky Macho with her neurosurgeon. Again this involve the discussion around whether or not to open the wound and try to apply a wound VAC following. With that being said the decision was made that that may be the best thing to do if the patient was in agreement. Nonetheless I am actually extremely encouraged with what I am seeing today much more than I would have thought. In fact I think that we may be over packing the wound which is why she is having some discomfort at this point as there is much less of the dressing able to get and this time compared to what we saw previous. That is actually really good news. In fact the 6:00  tunnel appears to have filled in is awesome news. At the 12:00 location I am actually able to pack into that area pretty effectively at this point today. In fact I was able to get less of the packing in which I will detail below. 12/18/2021 upon evaluation today patient appears to be doing well with regard to her wound on the back. Fortunately there is no signs of active infection which is great news and overall  very pleased with where things stand today. No fevers, chills, nausea, vomiting, or diarrhea. 01/01/2022 upon evaluation today patient appears to be doing decently well in regard to her wound. Fortunately there does not appear to be any signs of anything worsening which is great news. She still continues to have issues with drainage but this is not nearly as significant as what it was in the past. There is all clear drainage no signs of purulence noted. 01/08/2022 upon evaluation today patient appears to be doing well with regard to her wound. With that being said she tells me that she has been having some increased pain over the past week. It does appear based on the amount of Hydrofera Blue that we removed this is probably over pack. Again it is difficult to know exactly how old the nurses getting all the skin but nonetheless the length of Hydrofera Blue that was utilized was way too much which may be part of the reason why this felt so uncomfortable. Fortunately I think that we can cut back on that we discussed pieces smaller so hopefully that will not be over packed. 01/22/2021 upon evaluation today patient appears to be doing well With regard to there being no signs of infection at this time. The wound does still tunnel mainly up at the 12:00 location. The depth of the tunnel complete from entry point to the base is about 3.5 cm today. With that being said I do believe that overall she is making good progress this is just a very slow process for her which I know has been frustrating as  well. 01/29/2022 upon evaluation today patient appears to be doing well with regard to the wound on her lower back and the lumbar spine region. The good news is the depth that I got this week was a little bit less going up at the 12:00 location as compared to last week. I measured this to be 3.5 cm last week and it was 3.3 cm this week. Again that is a minute shift but nonetheless a good shift in the right direction. Overall I think that we are on the right track. 02/05/2022 upon evaluation today patient appears to be doing well with regard to her wound. In fact right now her measurements are somewhere around 2.9 cm which is definitely smaller and less deep than last week At the 12:00 location. Overall I am very pleased with what we are seeing. 02/19/2022 upon evaluation today patient unfortunately is noting that the wound is actually closing up externally but internally there is still some depth to this. I discussed with her today that we cannot let it close up like this is can end up being a significant issue for her if we allow for that. Subsequently we need to do what we can to try to open this up and keep it open more effectively. She voiced understanding. With that being said we are going to proceed with that procedure today in order to debride away some of the skin on externally that is trying to close and on this. 02/26/2022 upon evaluation patient appears to be doing better in my opinion after we had to open up this area last week. Again she has a significant amount of healing and overall I am extremely pleased with where things stand currently. I do not see any evidence of active infection locally nor systemically at this time which is great news and in general I think that we are on the right track for getting  this hopefully closed final and done. 03/05/2022 upon evaluation today patient unfortunately is doing a little bit worse with regard to the whole size this is starting to get much  smaller unfortunately. There does not appear to be any signs of active infection locally nor systemically which is great news. With that being said I am concerned about the fact that the patient is doing quite a bit worse with regard to trapping some fluid and almost feels like she may have a fluid pocket that not only goes in and up towards 12:00 but looks back around towards the more superficial subcutaneous tissue. I am very concerned that this is going to be something that is very hard for [place] to heal short of another surgery to open this up and try to wound VAC from the inside out. Even then there is no guarantees this is just a very difficult situation to be perfectly honest. 03/14/2022 upon evaluation today patient appears to be doing well with regard to her wound as far as the area staying open and pleased in that regard. With that being said unfortunately she is not doing as well when it comes to the irritation around it appears to be very inflamed and red this is not good that was discussed with her today as well. Subsequently I do believe that working to need to do what we can do to try and calm this down in the past she did well with the Augmentin due to Enterococcus infection I am not sure if were dealing with the same thing or not but at the same time I think that is probably to be my go to at this point. 03/19/2022 upon evaluation today patient appears to be doing really about the same in regard to her wound that the pain is better. 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 She seems to be improved dramatically with the antibiotic currently although it does not cover for MRSA I am apt to just see how this progresses over the next several days to a week. With that being said the bigger issue here is that we simply do not seem to be making excellent progress and I am concerned about the lack of progress. I discussed with Dr. Shon Baton with [facility] in [place]  and to be honest his recommendation was that the best bet would probably be to get her to a teaching center. She is actually been seen for rheumatology and a I believe psychologist/psychiatrist at [facility]. She would prefer to go that direction as opposed to [facility] or [facility]. I am definitely okay with that. 03/26/2022 upon evaluation today patient appears to be doing okay in regard to her wound. She is not showing signs of worsening and overall she tells me that her pain is not nearly as bad as it was previous. Fortunately I do not see any evidence of active infection locally or systemically which is great news. No fevers, chills, nausea, vomiting, or diarrhea. 04-02-2022 upon evaluation today patient appears to be doing well with regard to her wound. It does not appear to be showing any signs of worsening which is great news. With that being said you are still having some issues here with drainage although it is clear and mainly just tenderness with bleeding from the [facility] I do think that treating for the second issue which is MRSA is probably the right thing to do at this point she is now off of the Augmentin. 04-09-2022 upon  evaluation today patient's wound actually appears to be doing about the same. Fortunately I do not see any evidence of active infection locally or systemically at this time which is great news. No fevers, chills, nausea, vomiting, or diarrhea. 04-16-2022 upon evaluation today patient appears to be doing well with regard to her wound in the lumbar spine region. Subsequently she continues to have an area that does have a tunnel up into the 12:00 location. This is about 3.5 cm using a skinny probe curved upwards to get into this region. With that being said I really do not see any signs of overall improvement but also do not see any signs of worsening I feel like work on about a still made here to some degree. The patient did see her neurologist and he has referred her to  neurosurgery at [facility] for second ULYANA, HARDBARGER. ([phone]:[id number]) opinion we will see what they have to say as well. 04-23-2022 upon evaluation today patient appears to be doing well with regard to her wound all things considered. She has been tolerating the dressing changes without complication. Fortunately I do not see any signs of active infection locally nor systemically which is great news. No fever chills noted 04-30-2022 upon evaluation today patient appears to be doing about the same in regard to her wound. The depth is really not dramatically improved as far as the 12:00 tunnel is concerned. The overall depth and the base of the wound does seem to be a little bit better it does sound like that external wound has closed as far as the opening is concerned we can have to cut this down from a half to a smaller size based on what we are seeing today. 05-07-2022 upon evaluation today patient's wound actually seems to be doing decently well. There is not a major shift here but a slight shift in the depth both straight and as well as in the [place] at 12:00. With that being said I again we will talk about a couple of millimeters but still every little bit can count when there are situations like this to be honest. 05-14-2022 upon evaluation today patient appears to be doing okay in regard to her wound. I do not see any signs of anything worsening. With that being said also do not see getting significantly better unfortunately. There does not appear to be any evidence of infection locally or systemically which is great news. No fevers, chills, nausea, vomiting, or diarrhea. 05-21-2022 upon evaluation today patient actually appears to be doing quite well in regard to her wound from the standpoint of there being no infection. With that being said there does not appear to be any evidence of infection locally nor systemically which is great news. No fevers, chills, nausea, vomiting, or diarrhea. 05-28-2022  upon evaluation today patient's wound actually appears to be doing about the same. I do not see any evidence of active infection locally or systemically which is great news. No fevers, chills, nausea, vomiting, or diarrhea. 06-04-2022 upon evaluation today patient appears to be doing well with regard to her wound. She has been tolerating dressing changes without complication. Fortunately I do not feel like there is any worsening also I do not feel like there is any improvement. I feel like right a solid area here where she has a tunnel that tracks up at 12:00 and then has a fluid pocket at around roughly 1:00 I can push on this and squeeze fluid out. Again I am not exactly sure  what else can be done from my standpoint to improve this. She does have an appointment with Dr. Christella Noa who I do believe is an excellent surgeon and this is the surgeon who performed this surgery initially. Again the biggest issue was following she had a lot of trouble with the wound VAC I do think that we need to try to see if she is can have a wound VAC following surgery what exactly regular need to do to help keep this moving in the right direction for her. Obviously I want her to have the best result possible that she has to go through surgery again to clean this area out. Absolutely willing to help out with getting this to heal. 06-13-2022 upon evaluation today patient appears to be doing well with regard to her wound its not any worse unfortunately it is also not significantly better which is the only issue currently. I do not see any signs of active infection locally or systemically which is great news. No fevers, chills, nausea, vomiting, or diarrhea. She does have her appointment on Monday with her neurosurgeon and we will see you Dr. Christella Noa has to say at that point. 06-18-2022 upon evaluation patient appears to be doing a little worse in regard to her wound only in the respect that has been several days since she has had  this changed in fact it was last changed on Friday. Since that time she tells me that she has kept this in place over the weekend and yesterday she was supposed to see her neurosurgeon yesterday but they had to cancel due to an emergency surgery according to what the patient tells me. Nonetheless she is having some issues here with drainage coming from the wound that is more purulent in nature I think this is something we have been seeing in the [facility] for several weeks but is more obvious being that it was not changed as much during the last week. She unfortunately is a little worried about this but again I think that this is just part of what we have been dealing with all long is more prevalent and obvious due to the length of time since it was changed last. She does have a repeat appointment for I believe July 10 she told me although she is going to see if she can find anything sooner. 06-25-2022 upon evaluation today patient's wound actually showed signs of marginal improvement which is good news. She still has the area of abscess that seems to be at roughly 2:00 when looking at her back. We can get into this with the skinny probe other than that I really do not know what else I can do to try to make this better. We discussed this and she does have an appointment with her neurosurgeon although that is not till mid July as he had to change it at the last moment during the time she was post to see him a week ago. She does have evidence of potential infection on culture I am going to go ahead and treat this in order to ensure that nothing worsens. With that being said I do believe that overall she seems to be doing well but I do think we want to make sure that that the knee gets worse in the meantime until she gets to see her neurosurgeon. 07-01-2022 upon evaluation today patient appears to continue to have trouble with her wound draining. Again we have been continue to monitor this and I still  see  the same issue that I have noted before the patient has an area which is draining seemingly from around the 2:00 location when you go up towards 12:00 and then over towards 2:00 looking at her back this is where there is actually a soft area external that if you push on it you will see fluid come out and subsequently this is also where it tracks to internally. Everything in the 6:00 location has completely healed in and this looks much better in that regard but this upper portion we just cannot get to closed with the methods that we have. It seemed to me that this is probably going to require that this area be open and cleaned out in order to allow it to heal and my suggestion would be that a wound VAC is probably going to be the ideal thing. 07-09-2022 upon evaluation today patient appears to be doing somewhat poorly in regard to her wound. She did see Dr. Franky Macho. He stated that the wound was not being "packed appropriately" according to the patient and her daughter who were present during the evaluation today as well as the evaluation with him yesterday. With that being said he packed the wound the way he said it needed to be and to be partly honest that is over packed. The patient is having a tremendous amount of discomfort and states that this is no way that she is good to be able to tolerate that. I understand the concern about the whole closing down which is why I recommended trying to keep this open is much as possible and again with that all being said this is still going to continue to be an issue as far as this closing down before everything heals up on the inside. Nonetheless she continues to have issues with significant drainage. She also has an area which almost feels like a small abscess or least of fluid pocket that feels up that was not appropriately packed with the dressings that were in place. With that being said I think that this is going to have to be opened at some point in  time in order to get this to heal I am really not certain the best way to go about this. I discussed that with the patient again today. 07-16-2022 upon evaluation today patient appears to be doing well currently in regard to her wound from the standpoint of the pain getting better which is great news. Fortunately I do not see any evidence of active infection locally or systemically which is great news. No fevers, chills, nausea, vomiting, or diarrhea. With that being said the patient has been tolerating the dressing changes better without complication which is great news. ESPYN, RADWAN ([id number]) 07-29-2022 upon evaluation today patient appears to be doing actually pretty well in regard to her wound. Fortunately there does not appear to be any signs of infection. She still has drainage and we still are seeing an area from this pocket up at 12:00 that is leaking but fortunately this does not appear to be having any significant issues with infection right now which is great news. Fortunately her pain is also calm down since I saw her 2 weeks ago. Electronic Signature(s) Signed: 07/29/2022 4:27:32 PM By: [name] PA-C Entered By: [name] on 07/29/2022 16:27:32 Bart, Domingo Pulse ([id number]) -------------------------------------------------------------------------------- Physical Exam Details Patient Name: Stolze, Zaryiah C. Date of Service: 07/29/2022 3:30 PM Medical Record Number: [id number] Patient Account Number: [id number] Date of Birth/Sex: 08-09-1961 (61 y.o.  F) Treating RN: Yevonne Pax Primary Care Provider: [name] Other Clinician: Referring Provider: Card, John Treating Provider/Extender: Rowan Blase in Treatment: 63 Constitutional Well-nourished and well-hydrated in no acute distress. Respiratory normal breathing without difficulty. Psychiatric this patient is able to make decisions and demonstrates good insight into disease process. Alert and Oriented  x 3. pleasant and cooperative. Notes Upon inspection patient's wound bed actually showed signs of good granulation and epithelization at this point. Fortunately I do not see any evidence of active infection locally or systemically which is great news and overall I definitely think we are on the right track here. Electronic Signature(s) Signed: 07/29/2022 4:27:49 PM By: [name] PA-C Entered By: [name] on 07/29/2022 16:27:49 Manes, Domingo Pulse ([id number]) -------------------------------------------------------------------------------- Physician Orders Details Patient Name: Tiggs, Marlaine C. Date of Service: 07/29/2022 3:30 PM Medical Record Number: [id number] Patient Account Number: [id number] Date of Birth/Sex: July 29, 1961 (61 y.o. 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 o Nurse Visit as needed Home Health o Home Health Company: [facility] o [facility] for wound care. May utilize formulary equivalent dressing for wound treatment orders unless otherwise specified. Home Health Nurse may visit PRN to address patientos wound care needs. - Monday and Friday [facility] fax [phone] Bathing/ Shower/ Hygiene o May shower; gently cleanse wound with antibacterial soap, rinse and pat dry prior to dressing wounds o No tub bath. Anesthetic (Use 'Patient Medications' Section for Anesthetic Order Entry) o Lidocaine applied to wound bed Edema Control - Lymphedema /  Segmental Compressive Device / Other o Elevate, Exercise Daily and Avoid Standing for Long Periods of Time. o Elevate legs to the level of the heart and pump ankles as often as possible o Elevate leg(s) parallel to the floor when sitting. Additional Orders / Instructions o Follow Nutritious Diet and Increase Protein Intake Wound Treatment Wound #1 - Back Wound Laterality: Midline, Distal Cleanser: Normal Saline 1 x Per Day/30 Days Discharge Instructions: Wash your hands with soap and water. Remove old dressing, discard into plastic bag and place into trash. Cleanse the wound with Normal Saline prior to applying a clean dressing using gauze sponges, not tissues or cotton balls. Do not scrub or use excessive force. Pat dry using gauze sponges, not tissue or cotton balls. Primary Dressing: Hydrofera Blue Rope 1 x Per Day/30 Days Discharge Instructions: cut into fourths; angle the end Secondary Dressing: (SILICONE BORDER) Zetuvit Plus SILICONE BORDER Dressing 4x4 (in/in) 1 x Per Day/30 Days Electronic Signature(s) Signed: 07/29/2022 4:47:26 PM By: Yevonne Pax RN Signed: 07/29/2022 5:40:09 PM By: [name] PA-C Entered By: Yevonne Pax on 07/29/2022 16:14:47 Fulco, Shimeka C. ([id number]) -------------------------------------------------------------------------------- Problem List Details Patient Name: Eckardt, Amira C. Date of Service: 07/29/2022 3:30 PM Medical Record Number: [id number] Patient Account Number: [id number] Date of Birth/Sex: 11/27/1961 (61 y.o. F) Treating RN: Yevonne Pax Primary Care Provider: [name] Other Clinician: Referring Provider: Card, John Treating Provider/Extender: Rowan Blase in Treatment: 41 Active Problems ICD-10 Encounter Code Description Active Date MDM Diagnosis T81.31XA Disruption of external operation (surgical) wound, not elsewhere 10/15/2021 No Yes classified, initial encounter L98.422 Non-pressure  chronic ulcer of back  with fat layer exposed 10/15/2021 No Yes G35 Multiple sclerosis 10/15/2021 No Yes I10 Essential (primary) hypertension 10/15/2021 No Yes Inactive Problems Resolved Problems Electronic Signature(s) Signed: 07/29/2022 3:58:33 PM By: [name] PA-C Entered By: [name] on 07/29/2022 15:58:32 Aschoff, Ceriah C. ([id number]) -------------------------------------------------------------------------------- Progress Note Details Patient Name: Hayman, Renia C. Date of Service: 07/29/2022 3:30 PM Medical Record Number: [id number] Patient Account Number: [id number] Date of Birth/Sex: 19-May-1961 (61 y.o. F) Treating RN: Yevonne Pax Primary Care Provider: [name] Other Clinician: Referring Provider: Card, John Treating Provider/Extender: Rowan Blase in Treatment: 66 Subjective Chief Complaint Information obtained from Patient Surgical Back Ulcer History of Present Illness (HPI) 10/15/2021 upon evaluation today patient presents for initial evaluation here in the clinic concerning a surgical ulceration/dehiscence in the lumbar spine region following surgery that she had over the past year. This was actually broken up into 3 separate surgical events. The initial surgical intervention actually was on November 05, 2021 almost a year ago. Subsequently the patient went back in February for a seroma of the area which unfortunately required her to have a repeat surgery to go in and clean this out. And then again this occurred in April where she went back in and again they felt like stitches were coming out and there was an additional seroma. She was placed in a wound VAC initially and then subsequently as it got smaller that was discontinued. Again right now I will see anything that I think a wound VAC would help with. Nonetheless she definitely has a significant depth to the wound that is going require packing. I actually believe the Hydrofera Blue rope would probably do quite well  with this the problem is as much as it is draining she probably needs this to be changed at least every day. She does not really have anyone that can help with that that is the complicating scenario here. With that being said the patient does have a history of multiple sclerosis, hypertension, and again this surgical wound dehiscence in regard to her lumbar spine region. She did have a repeat MRI which was actually completed 10/09/2021. This showed that there was no significant change in the subcutaneous fluid collection/track of the lower lumbar region. This is extending to the level of the fascia unfortunately. This seems to go all the way from the L2 level with a track extending all the way to the fascia at the L4-5 level. Again this is a significant wound and there is significant drainage but does not seem to communicate to the spinal region as far as spinal fluid or otherwise is concerned that is good news. Nonetheless she last saw Dr. Franky Macho who is her neurosurgeon on 10/01/2021 that was when he ordered this last MRI she supposed to see him next week as well. With that being said he did not feel like there was any significant issue there but was not sure why this was not healing that is when he ordered the MRI. They were wanting to make sure that this was packed appropriately by home health unfortunately the main issue currently is that home health is completely out of the picture as the patient has exhausted all the home health that that she gets for a year. She is now in a very difficult predicament where she does not have anyone to help her change the dressing and to be honest that she is not able to do it herself with the location of the  wound being on the midline lumbar spine region. If she does not have anyone that can help it is probably can to be necessary for her to go to a facility for rehab and daily dressing changes as I feel like daily changes which is much drainage that she is having  is going to be necessary. 10/23/2021 upon evaluation today patient appears to be doing decently well in regard to her back ulcer. This does seem to be draining a lot less than what it is been doing in the past. With that being said she still has quite a bit of drainage nonetheless. I do think that given time this should improve least I hope so. The good news is she does have home health coming out 3 days a week were doing it 2 days a week and she is paying someone we can to help. 10/30/2021 upon evaluation today patient appears to be doing okay in regard to her back ulcer this is not draining quite as bad as it was in the beginning but he still has quite a bit of drainage noted. I do believe that the patient would benefit from [place] going ahead forward with attempting a wound VAC using the Hydrofera Blue rope to pack with and then subsequently using the VAC externally to actually suction out and help this to fill- in. I think this is our ideal way to try to get things cleared at this point. As it stands I am not certain that we are really making a progress that we want to see near with doing it in the way we are which is packing with the rope. It is a good dressing but I do think it is insufficient for total healing. She just seems to have too much in the way of drainage at this point unfortunately. 11/06/2021 upon evaluation today patient unfortunately continues to have issues with her back ongoing. The good news is her MRI that was repeated showed signs of the size of this area in the lumbar spine region having decreased from 4 cm to 3.5 cm this is definitely not bad news at all. With that being said unfortunately she continues to have issues with ongoing drainage not as severe as in the beginning but nonetheless still significant. I do think a wound VAC still would be a good way to go although her home health agency nurse apparently has some concerns about the possibility of not being able to keep a  seal with this as they had struggles in the past. Nonetheless I explained to the patient that this is much different than what she had previous and that I really feel like it would do much better as far as getting the area taken care of without having any complications or issues here. I think that we should be able to maintain a seal. Nonetheless at this time I did discuss with the patient as well that she probably does need to have a wound VAC in order for [place] to get this moving in the right direction. 11/13/2021 upon evaluation patient's wound bed actually showed signs of significant drainage at this time. She did see the surgeon yesterday he did not see anything that appeared to be infected. Nonetheless he does appear that she is continuing to have areas here that just do not seem to want to seal up there MRI findings have been negative but nonetheless she continues to have is the seroma that is filling in. I do feel like we need to  try to widen the hole so we can get at least a half of the [facility] then this will be better than nothing at this point. 11/20/2021 upon evaluation today patient actually appears to be having less pain at this point which is good news and overall she we still do not have the results of the culture back yet it had to be sent out to [facility] and we do not have the result back yet. Is doing decently well in regard to her wound. Fortunately there does not appear to be any signs of active infection systemically nor locally at this time. 11/27/2021 upon evaluation today patient appears to be doing well with regard to her wound all things considered there does appear to be less drainage than there was previous. Fortunately I do not see any evidence of worsening of the patient is stating that she is having some issues with back pain. This is somewhat new. Again this I think could be related to the fact that she is having some issues here with infection. We are still waiting  to see what the result of her culture shows from susceptibility testing Enterococcus has been identified but we do not know if this is VRE or not. 12/04/2021 upon evaluation today patient appears to be doing well with regard to her wound all things considered. I did have a conversation with Erin from Dr. Sueanne Margarita office. Of note she notes that Dr. Drinda Butts really does not want to do anything surgical right now which I completely Naron, Barry C. ([id number]) understand. With that being said I am still leery of how far we will make it getting this to heal short of any type of surgery to open this up and allow [place] to more appropriately packed the wound. Nonetheless I will absolutely give it our best shot as far as that is concerned. I discussed that with the patient today. She voiced understanding. The good news is the drainage today seems to be clear it is no longer cloudy as it was previous I am actually very pleased in that regard. 12/11/2021 since have last seen the patient actually did have an conversation with Dr. Franky Macho with her neurosurgeon. Again this involve the discussion around whether or not to open the wound and try to apply a wound VAC following. With that being said the decision was made that that may be the best thing to do if the patient was in agreement. Nonetheless I am actually extremely encouraged with what I am seeing today much more than I would have thought. In fact I think that we may be over packing the wound which is why she is having some discomfort at this point as there is much less of the dressing able to get and this time compared to what we saw previous. That is actually really good news. In fact the 6:00 tunnel appears to have filled in is awesome news. At the 12:00 location I am actually able to pack into that area pretty effectively at this point today. In fact I was able to get less of the packing in which I will detail below. 12/18/2021 upon evaluation today  patient appears to be doing well with regard to her wound on the back. Fortunately there is no signs of active infection which is great news and overall very pleased with where things stand today. No fevers, chills, nausea, vomiting, or diarrhea. 01/01/2022 upon evaluation today patient appears to be doing decently well in regard to her wound. Fortunately  there does not appear to be any signs of anything worsening which is great news. She still continues to have issues with drainage but this is not nearly as significant as what it was in the past. There is all clear drainage no signs of purulence noted. 01/08/2022 upon evaluation today patient appears to be doing well with regard to her wound. With that being said she tells me that she has been having some increased pain over the past week. It does appear based on the amount of Hydrofera Blue that we removed this is probably over pack. Again it is difficult to know exactly how old the nurses getting all the skin but nonetheless the length of Hydrofera Blue that was utilized was way too much which may be part of the reason why this felt so uncomfortable. Fortunately I think that we can cut back on that we discussed pieces smaller so hopefully that will not be over packed. 01/22/2021 upon evaluation today patient appears to be doing well With regard to there being no signs of infection at this time. The wound does still tunnel mainly up at the 12:00 location. The depth of the tunnel complete from entry point to the base is about 3.5 cm today. With that being said I do believe that overall she is making good progress this is just a very slow process for her which I know has been frustrating as well. 01/29/2022 upon evaluation today patient appears to be doing well with regard to the wound on her lower back and the lumbar spine region. The good news is the depth that I got this week was a little bit less going up at the 12:00 location as compared to last week.  I measured this to be 3.5 cm last week and it was 3.3 cm this week. Again that is a minute shift but nonetheless a good shift in the right direction. Overall I think that we are on the right track. 02/05/2022 upon evaluation today patient appears to be doing well with regard to her wound. In fact right now her measurements are somewhere around 2.9 cm which is definitely smaller and less deep than last week At the 12:00 location. Overall I am very pleased with what we are seeing. 02/19/2022 upon evaluation today patient unfortunately is noting that the wound is actually closing up externally but internally there is still some depth to this. I discussed with her today that we cannot let it close up like this is can end up being a significant issue for her if we allow for that. Subsequently we need to do what we can to try to open this up and keep it open more effectively. She voiced understanding. With that being said we are going to proceed with that procedure today in order to debride away some of the skin on externally that is trying to close and on this. 02/26/2022 upon evaluation patient appears to be doing better in my opinion after we had to open up this area last week. Again she has a significant amount of healing and overall I am extremely pleased with where things stand currently. I do not see any evidence of active infection locally nor systemically at this time which is great news and in general I think that we are on the right track for getting this hopefully closed final and done. 03/05/2022 upon evaluation today patient unfortunately is doing a little bit worse with regard to the whole size this is starting to get much smaller  unfortunately. There does not appear to be any signs of active infection locally nor systemically which is great news. With that being said I am concerned about the fact that the patient is doing quite a bit worse with regard to trapping some fluid and almost feels like  she may have a fluid pocket that not only goes in and up towards 12:00 but looks back around towards the more superficial subcutaneous tissue. I am very concerned that this is going to be something that is very hard for [place] to heal short of another surgery to open this up and try to wound VAC from the inside out. Even then there is no guarantees this is just a very difficult situation to be perfectly honest. 03/14/2022 upon evaluation today patient appears to be doing well with regard to her wound as far as the area staying open and pleased in that regard. With that being said unfortunately she is not doing as well when it comes to the irritation around it appears to be very inflamed and red this is not good that was discussed with her today as well. Subsequently I do believe that working to need to do what we can do to try and calm this down in the past she did well with the Augmentin due to Enterococcus infection I am not sure if were dealing with the same thing or not but at the same time I think that is probably to be my go to at this point. 03/19/2022 upon evaluation today patient appears to be doing really about the same in regard to her wound that the pain is better. 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 She seems to be improved dramatically with the antibiotic currently although it does not cover for MRSA I am apt to just see how this progresses over the next several days to a week. With that being said the bigger issue here is that we simply do not seem to be making excellent progress and I am concerned about the lack of progress. I discussed with Dr. Shon Baton with [facility] in [place] and to be honest his recommendation was that the best bet would probably be to get her to a teaching center. She is actually been seen for rheumatology and a I believe psychologist/psychiatrist at [facility]. She would prefer to go that direction as opposed to [facility] or [facility]. I am  definitely okay with that. 03/26/2022 upon evaluation today patient appears to be doing okay in regard to her wound. She is not showing signs of worsening and overall she tells me that her pain is not nearly as bad as it was previous. Fortunately I do not see any evidence of active infection locally or systemically which is great news. No fevers, chills, nausea, vomiting, or diarrhea. 04-02-2022 upon evaluation today patient appears to be doing well with regard to her wound. It does not appear to be showing any signs of worsening which is great news. With that being said you are still having some issues here with drainage although it is clear and mainly just tenderness with bleeding from the [facility] I do think that treating for the second issue which is MRSA is probably the right thing to do at this point she is now off of the Augmentin. 04-09-2022 upon evaluation today patient's wound actually appears to be doing about the same. Fortunately I do not see any evidence of active infection locally or systemically at this time which is great  news. No fevers, chills, nausea, vomiting, or diarrhea. ROSELEE, TAYLOE ([id number]) 04-16-2022 upon evaluation today patient appears to be doing well with regard to her wound in the lumbar spine region. Subsequently she continues to have an area that does have a tunnel up into the 12:00 location. This is about 3.5 cm using a skinny probe curved upwards to get into this region. With that being said I really do not see any signs of overall improvement but also do not see any signs of worsening I feel like work on about a still made here to some degree. The patient did see her neurologist and he has referred her to neurosurgery at [facility] for second opinion we will see what they have to say as well. 04-23-2022 upon evaluation today patient appears to be doing well with regard to her wound all things considered. She has been tolerating the dressing changes without  complication. Fortunately I do not see any signs of active infection locally nor systemically which is great news. No fever chills noted 04-30-2022 upon evaluation today patient appears to be doing about the same in regard to her wound. The depth is really not dramatically improved as far as the 12:00 tunnel is concerned. The overall depth and the base of the wound does seem to be a little bit better it does sound like that external wound has closed as far as the opening is concerned we can have to cut this down from a half to a smaller size based on what we are seeing today. 05-07-2022 upon evaluation today patient's wound actually seems to be doing decently well. There is not a major shift here but a slight shift in the depth both straight and as well as in the [place] at 12:00. With that being said I again we will talk about a couple of millimeters but still every little bit can count when there are situations like this to be honest. 05-14-2022 upon evaluation today patient appears to be doing okay in regard to her wound. I do not see any signs of anything worsening. With that being said also do not see getting significantly better unfortunately. There does not appear to be any evidence of infection locally or systemically which is great news. No fevers, chills, nausea, vomiting, or diarrhea. 05-21-2022 upon evaluation today patient actually appears to be doing quite well in regard to her wound from the standpoint of there being no infection. With that being said there does not appear to be any evidence of infection locally nor systemically which is great news. No fevers, chills, nausea, vomiting, or diarrhea. 05-28-2022 upon evaluation today patient's wound actually appears to be doing about the same. I do not see any evidence of active infection locally or systemically which is great news. No fevers, chills, nausea, vomiting, or diarrhea. 06-04-2022 upon evaluation today patient appears to be doing well  with regard to her wound. She has been tolerating dressing changes without complication. Fortunately I do not feel like there is any worsening also I do not feel like there is any improvement. I feel like right a solid area here where she has a tunnel that tracks up at 12:00 and then has a fluid pocket at around roughly 1:00 I can push on this and squeeze fluid out. Again I am not exactly sure what else can be done from my standpoint to improve this. She does have an appointment with Dr. Franky Macho who I do believe is an excellent surgeon and this is the  surgeon who performed this surgery initially. Again the biggest issue was following she had a lot of trouble with the wound VAC I do think that we need to try to see if she is can have a wound VAC following surgery what exactly regular need to do to help keep this moving in the right direction for her. Obviously I want her to have the best result possible that she has to go through surgery again to clean this area out. Absolutely willing to help out with getting this to heal. 06-13-2022 upon evaluation today patient appears to be doing well with regard to her wound its not any worse unfortunately it is also not significantly better which is the only issue currently. I do not see any signs of active infection locally or systemically which is great news. No fevers, chills, nausea, vomiting, or diarrhea. She does have her appointment on Monday with her neurosurgeon and we will see you Dr. Franky Macho has to say at that point. 06-18-2022 upon evaluation patient appears to be doing a little worse in regard to her wound only in the respect that has been several days since she has had this changed in fact it was last changed on Friday. Since that time she tells me that she has kept this in place over the weekend and yesterday she was supposed to see her neurosurgeon yesterday but they had to cancel due to an emergency surgery according to what the patient tells me.  Nonetheless she is having some issues here with drainage coming from the wound that is more purulent in nature I think this is something we have been seeing in the [facility] for several weeks but is more obvious being that it was not changed as much during the last week. She unfortunately is a little worried about this but again I think that this is just part of what we have been dealing with all long is more prevalent and obvious due to the length of time since it was changed last. She does have a repeat appointment for I believe July 10 she told me although she is going to see if she can find anything sooner. 06-25-2022 upon evaluation today patient's wound actually showed signs of marginal improvement which is good news. She still has the area of abscess that seems to be at roughly 2:00 when looking at her back. We can get into this with the skinny probe other than that I really do not know what else I can do to try to make this better. We discussed this and she does have an appointment with her neurosurgeon although that is not till mid July as he had to change it at the last moment during the time she was post to see him a week ago. She does have evidence of potential infection on culture I am going to go ahead and treat this in order to ensure that nothing worsens. With that being said I do believe that overall she seems to be doing well but I do think we want to make sure that that the knee gets worse in the meantime until she gets to see her neurosurgeon. 07-01-2022 upon evaluation today patient appears to continue to have trouble with her wound draining. Again we have been continue to monitor this and I still see the same issue that I have noted before the patient has an area which is draining seemingly from around the 2:00 location when you go up towards 12:00 and then over towards  2:00 looking at her back this is where there is actually a soft area external that if you push on it you will  see fluid come out and subsequently this is also where it tracks to internally. Everything in the 6:00 location has completely healed in and this looks much better in that regard but this upper portion we just cannot get to closed with the methods that we have. It seemed to me that this is probably going to require that this area be open and cleaned out in order to allow it to heal and my suggestion would be that a wound VAC is probably going to be the ideal thing. 07-09-2022 upon evaluation today patient appears to be doing somewhat poorly in regard to her wound. She did see Dr. Franky Macho. He stated that the wound was not being "packed appropriately" according to the patient and her daughter who were present during the evaluation today as well as the evaluation with him yesterday. With that being said he packed the wound the way he said it needed to be and to be partly honest that is over packed. The patient is having a tremendous amount of discomfort and states that this is no way that she is good to be able to tolerate that. I understand the concern about the whole closing down which is why I recommended trying to keep this open is much as possible and again with that all being said this is still going to continue to be an issue as far as this closing down before everything heals up on the inside. Nonetheless she continues to have issues with significant drainage. She also has an area which almost feels like a small abscess or least of fluid pocket that feels up that was not appropriately packed with the dressings that were in place. With that being said I think that this is going to have to be opened at some point in time in order to get this to heal I am really not certain the best way to go about this. I discussed that with the patient again today. OKEMA, ROLLINSON ([id number]) 07-16-2022 upon evaluation today patient appears to be doing well currently in regard to her wound from the standpoint of  the pain getting better which is great news. Fortunately I do not see any evidence of active infection locally or systemically which is great news. No fevers, chills, nausea, vomiting, or diarrhea. With that being said the patient has been tolerating the dressing changes better without complication which is great news. 07-29-2022 upon evaluation today patient appears to be doing actually pretty well in regard to her wound. Fortunately there does not appear to be any signs of infection. She still has drainage and we still are seeing an area from this pocket up at 12:00 that is leaking but fortunately this does not appear to be having any significant issues with infection right now which is great news. Fortunately her pain is also calm down since I saw her 2 weeks ago. Objective Constitutional Well-nourished and well-hydrated in no acute distress. Vitals Time Taken: 4:00 PM, Height: 58 in, Weight: 275 lbs, BMI: 57.5, Temperature: 98.4 F, Pulse: 88 bpm, Respiratory Rate: 18 breaths/min, Blood Pressure: 125/79 mmHg. Respiratory normal breathing without difficulty. Psychiatric this patient is able to make decisions and demonstrates good insight into disease process. Alert and Oriented x 3. pleasant and cooperative. General Notes: Upon inspection patient's wound bed actually showed signs of good granulation and epithelization at this point. Fortunately  I do not see any evidence of active infection locally or systemically which is great news and overall I definitely think we are on the right track here. Integumentary (Hair, Skin) Wound #1 status is Open. Original cause of wound was Surgical Injury. The date acquired was: 04/11/2021. The wound has been in treatment 41 weeks. The wound is located on the Distal,Midline Back. The wound measures 0.3cm length x 0.3cm width x 2.5cm depth; 0.071cm^2 area and 0.177cm^3 volume. There is Fat Layer (Subcutaneous Tissue) exposed. There is no tunneling or  undermining noted. There is a medium amount of serosanguineous drainage noted. There is large (67-100%) red granulation within the wound bed. There is no necrotic tissue within the wound bed. 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 Nurse Visit as needed Home Health: Home Health Company: - [facility] for wound care. May utilize formulary equivalent dressing for wound treatment orders unless otherwise specified. Home Health Nurse may visit PRN to address patient s wound care needs. - Monday and Friday [facility] fax [phone] Bathing/ Shower/ Hygiene: May shower; gently cleanse wound with antibacterial soap, rinse and pat dry prior to dressing wounds No tub bath. Anesthetic (Use 'Patient Medications' Section for Anesthetic Order Entry): Maday, Casondra C. ([id number]) Lidocaine applied to wound bed Edema Control - Lymphedema / Segmental Compressive Device / Other: Elevate, Exercise Daily and Avoid Standing for Long Periods of Time. Elevate legs to the level of the heart and pump ankles as often as possible Elevate leg(s) parallel to the floor when sitting. Additional Orders / Instructions: Follow Nutritious Diet and Increase Protein Intake WOUND #1: - Back Wound Laterality: Midline, Distal Cleanser: Normal Saline 1 x Per Day/30 Days Discharge Instructions: Wash your hands with soap and water. Remove old dressing, discard into plastic bag and place into trash. Cleanse the wound with Normal Saline prior to applying a clean dressing using gauze sponges, not tissues or cotton balls. Do not scrub or use excessive force. Pat dry using gauze sponges, not tissue or cotton balls. Primary Dressing: Hydrofera Blue Rope 1 x  Per Day/30 Days Discharge Instructions: cut into fourths; angle the end Secondary Dressing: (SILICONE BORDER) Zetuvit Plus SILICONE BORDER Dressing 4x4 (in/in) 1 x Per Day/30 Days 1. I am going to suggest that we go ahead and continue with the Hydrofera Blue rope packing actually feel like this is a little bit better today compared to what it was previous. The patient is in agreement with the plan. 2. I am also can recommend that we continue with the bordered foam dressing to cover. This will be changed by home health as well as [place] and she has her friend do it on the weekends as well. We will see patient back for reevaluation in 1 week here in the clinic. If anything worsens or changes patient will contact our office for additional recommendations. Electronic Signature(s) Signed: 07/29/2022 4:28:39 PM By: [name] PA-C Entered By: [name] on 07/29/2022 16:28:39 Raphael, Tinaya Salena Saner ([id number]) -------------------------------------------------------------------------------- SuperBill Details Patient Name: Yon, Tannie C. Date of Service: 07/29/2022 Medical Record Number: [id number] Patient Account Number: [id number] Date of Birth/Sex: 1961/10/09 (61 y.o. 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 G35 Multiple sclerosis I10 Essential (primary) hypertension Facility Procedures CPT4 Code: [id number] Description: 99213 - WOUND CARE VISIT-LEV 3 EST PT Modifier: Quantity: 1 Physician Procedures CPT4 Code: [id number] Description: 99213 - WC PHYS LEVEL 3 - EST PT Modifier: Quantity: 1 CPT4 Code: Description: ICD-10 Diagnosis  Description T81.31XA Disruption of external operation (surgical) wound, not elsewhere classifi 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

## 2022-07-29 NOTE — Progress Notes (Addendum)
[name], [name] ([id number]) Visit Report for 07/29/2022 Arrival Information Details Patient Name: [name], [name]. Date of Service: 07/29/2022 3:30 PM Medical Record Number: [id number] Patient Account Number: [id number] Date of Birth/Sex: 06-05-1961 (61 y.o. F) Treating RN: [name] Primary Care Roxy [name]: [name], [name] Other Clinician: Referring [name]: [name], [name] Treating [name]/Extender: [name] in Treatment: 15 Visit Information History Since Last Visit All ordered tests and consults were completed: No Patient Arrived: [name] Added or deleted any medications: No Arrival Time: 15:54 Any new allergies or adverse reactions: No Accompanied By: self Had a fall or experienced change in No Transfer Assistance: None activities of daily living that may affect Patient Identification Verified: Yes risk of falls: Secondary Verification Process Completed: Yes Signs or symptoms of abuse/neglect since last visito No Patient Requires Transmission-Based Precautions: No Hospitalized since last visit: No Patient Has Alerts: No Implantable device outside of the clinic excluding No cellular tissue based products placed in the center since last visit: Has Dressing in Place as Prescribed: Yes Pain Present Now: No Electronic Signature(s) Signed: 07/29/2022 4:47:26 PM By: [name] Pax RN Entered By: [name] on 07/29/2022 16:00:09 [name], [name] ([id number]) -------------------------------------------------------------------------------- Clinic Level of Care Assessment Details Patient Name: [name], [name] Date of Service: 07/29/2022 3:30 PM Medical Record Number: [id number] Patient Account Number: [id number] Date of Birth/Sex: 11/02/1961 (61 y.o. F) Treating RN: [name] Primary Care Damyia Strider: [name], [name] Other Clinician: Referring [name]: [name], [name] Treating [name]/Extender: [name] in Treatment: 44 Clinic Level of Care Assessment Items TOOL 4  Quantity Score X - Use when only an EandM is performed on FOLLOW-UP visit 1 0 ASSESSMENTS - Nursing Assessment / Reassessment X - Reassessment of Co-morbidities (includes updates in patient status) 1 10 X- 1 5 Reassessment of Adherence to Treatment Plan ASSESSMENTS - Wound and Skin Assessment / Reassessment X - Simple Wound Assessment / Reassessment - one wound 1 5 []  - 0 Complex Wound Assessment / Reassessment - multiple wounds []  - 0 Dermatologic / Skin Assessment (not related to wound area) ASSESSMENTS - Focused Assessment []  - Circumferential Edema Measurements - multi extremities 0 []  - 0 Nutritional Assessment / Counseling / Intervention []  - 0 Lower Extremity Assessment (monofilament, tuning fork, pulses) []  - 0 Peripheral Arterial Disease Assessment (using hand held doppler) ASSESSMENTS - Ostomy and/or Continence Assessment and Care []  - Incontinence Assessment and Management 0 []  - 0 Ostomy Care Assessment and Management (repouching, etc.) PROCESS - Coordination of Care X - Simple Patient / Family Education for ongoing care 1 15 []  - 0 Complex (extensive) Patient / Family Education for ongoing care []  - 0 Staff obtains , Records, Test Results / Process Orders []  - 0 Staff telephones HHA, Nursing Homes / Clarify orders / etc []  - 0 Routine Transfer to another Facility (non-emergent condition) []  - 0 Routine Hospital Admission (non-emergent condition) []  - 0 New Admissions / / Ordering NPWT, Apligraf, etc. []  - 0 Emergency Hospital Admission (emergent condition) X- 1 10 Simple Discharge Coordination []  - 0 Complex (extensive) Discharge Coordination PROCESS - Special Needs []  - Pediatric / Minor Patient Management 0 []  - 0 Isolation Patient Management []  - 0 Hearing / Language / Visual special needs []  - 0 Assessment of Community assistance (transportation, D/[name] planning, etc.) []  - 0 Additional assistance / Altered  mentation []  - 0 Support Surface(s) Assessment (bed, cushion, seat, etc.) INTERVENTIONS - Wound Cleansing / Measurement [name], [name] ( ) X- 1 5 Simple Wound Cleansing -  one wound []  - 0 Complex Wound Cleansing - multiple wounds X- 1 5 Wound Imaging (photographs - any number of wounds) []  - 0 Wound Tracing (instead of photographs) X- 1 5 Simple Wound Measurement - one wound []  - 0 Complex Wound Measurement - multiple wounds INTERVENTIONS - Wound Dressings X - Small Wound Dressing one or multiple wounds 1 10 []  - 0 Medium Wound Dressing one or multiple wounds []  - 0 Large Wound Dressing one or multiple wounds X- 1 5 Application of Medications - topical []  - 0 Application of Medications - injection INTERVENTIONS - Miscellaneous []  - External ear exam 0 []  - 0 Specimen Collection (cultures, biopsies, blood, body fluids, etc.) []  - 0 Specimen(s) / Culture(s) sent or taken to Lab for analysis []  - 0 Patient Transfer (multiple staff / / Similar devices) []  - 0 Simple Staple / Suture removal (25 or less) []  - 0 Complex Staple / Suture removal (26 or more) []  - 0 Hypo / Hyperglycemic Management (close monitor of Blood Glucose) []  - 0 Ankle / Brachial Index (ABI) - do not check if billed separately X- 1 5 Vital Signs Has the patient been seen at the hospital within the last three years: Yes Total Score: 80 Level Of Care: New/Established - Level 3 Electronic Signature(s) Signed: 07/29/2022 4:47:26 PM By: RN Entered By: on 07/29/2022 16:20:51 [name], [name] ( ) -------------------------------------------------------------------------------- Encounter Discharge Information Details Patient Name: [name], [name] Date of Service: 07/29/2022 3:30 PM Medical Record Number: Patient Account Number: Date of Birth/Sex: 1961/12/13 (61 y.o. F) Treating RN: Primary Care Michille Mcelrath:  Other Clinician: Referring Quantae Martel: [name], [name] Treating Hazleigh Mccleave/Extender: in Treatment: 46 Encounter Discharge Information Items Discharge Condition: Stable Ambulatory Status: Walker Discharge Destination: Home Transportation: Private Auto Accompanied By: self Schedule Follow-up Appointment: Yes Clinical Summary of Care: Electronic Signature(s) Signed: 07/29/2022 4:47:26 PM By: [name] Pax RN Entered By: 07/31/2022 on 07/29/2022 16:21:43 Metheny, Latroya [name]. ([id number]) -------------------------------------------------------------------------------- Lower Extremity Assessment Details Patient Name: [name], [name] Date of Service: 07/29/2022 3:30 PM Medical Record Number: [id number] Patient Account Number: [id number] Date of Birth/Sex: 1961-12-28 (61 y.o. F) Treating RN: [name] Primary Care Partick Musselman: [name] Other Clinician: Referring Capitola Ladson: [name], [name] Treating Anitha Kreiser/Extender: [name] in Treatment: 41 Electronic Signature(s) Signed: 07/29/2022 4:47:26 PM By: 07/31/2022 RN Entered By: [name] on 07/29/2022 16:04:04 Delellis, Mesha C08/01/2022 ([id number]) -------------------------------------------------------------------------------- Multi Wound Chart Details Patient Name: [name], [name] Date of Service: 07/29/2022 3:30 PM Medical Record Number: [id number] Patient Account Number: [id number] Date of Birth/Sex: 1961/09/20 (61 y.o. F) Treating RN: [name] Primary Care [name] Cygan: [name] Other Clinician: Referring Kairy Folsom: [name], [name] Treating Astin Sayre/Extender: [name] in Treatment: 41 Vital Signs Height(in): 58 Pulse(bpm): 88 Weight(lbs): 275 Blood Pressure(mmHg): 125/79 Body Mass Index(BMI): 57.5 Temperature(F): 98.4 Respiratory Rate(breaths/min): 18 Photos: [N/A:N/A] Wound Location: Distal, Midline Back N/A N/A Wounding Event: Surgical Injury N/A N/A Primary Etiology: Dehisced Wound N/A  N/A Comorbid History: Hypertension N/A N/A Date Acquired: 04/11/2021 N/A N/A Weeks of Treatment: 41 N/A N/A Wound Status: Open N/A N/A Wound Recurrence: No N/A N/A Measurements L x W x D (cm) 0.3x0.3x2.5 N/A N/A Area (cm) : 0.071 N/A N/A Volume (cm) : 0.177 N/A N/A % Reduction in Area: 43.70% N/A N/A % Reduction in Volume: 69.40% N/A N/A Classification: Full Thickness Without Exposed N/A N/A Support Structures Exudate Amount: Medium N/A N/A Exudate Type: Serosanguineous N/A N/A Exudate  Color: red, brown N/A N/A Granulation Amount: Large (67-100%) N/A N/A Granulation Quality: Red N/A N/A Necrotic Amount: None Present (0%) N/A N/A Exposed Structures: Fat Layer (Subcutaneous Tissue): N/A N/A Yes Fascia: No Tendon: No Muscle: No Joint: No Bone: No Epithelialization: None N/A N/A Treatment Notes Electronic Signature(s) Signed: 07/29/2022 4:47:26 PM By: [name] Pax RN Entered By: [name] on 07/29/2022 16:05:11 [name], [name] Salena Saner ([id number]) -------------------------------------------------------------------------------- Multi-Disciplinary Care Plan Details Patient Name: [name], [name] Date of Service: 07/29/2022 3:30 PM Medical Record Number: [id number] Patient Account Number: [id number] Date of Birth/Sex: Dec 25, 1961 (61 y.o. F) Treating RN: [name] Primary Care Othniel Maret: [name] Other Clinician: Referring Teegan Brandis: [name], [name] Treating Faaris Arizpe/Extender: [name] in Treatment: 68 Active Inactive Wound/Skin Impairment Nursing Diagnoses: Knowledge deficit related to ulceration/compromised skin integrity Goals: Patient/caregiver will verbalize understanding of skin care regimen Date Initiated: 10/15/2021 Target Resolution Date: 08/09/2022 Goal Status: Active Ulcer/skin breakdown will have a volume reduction of 30% by week 4 Date Initiated: 10/15/2021 Date Inactivated: 01/29/2022 Target Resolution Date: 12/15/2021 Goal Status: Unmet Unmet  Reason: comorbities Ulcer/skin breakdown will have a volume reduction of 50% by week 8 Date Initiated: 10/15/2021 Date Inactivated: 01/29/2022 Target Resolution Date: 01/15/2022 Goal Status: Unmet Unmet Reason: comorbities Ulcer/skin breakdown will have a volume reduction of 80% by week 12 Date Initiated: 10/15/2021 Date Inactivated: 02/19/2022 Target Resolution Date: 02/15/2022 Goal Status: Unmet Unmet Reason: comorbities Ulcer/skin breakdown will heal within 14 weeks Date Initiated: 10/15/2021 Date Inactivated: 05/07/2022 Target Resolution Date: 03/15/2022 Goal Status: Unmet Unmet Reason: comorbities Interventions: Assess patient/caregiver ability to obtain necessary supplies Assess patient/caregiver ability to perform ulcer/skin care regimen upon admission and as needed Assess ulceration(s) every visit Notes: Electronic Signature(s) Signed: 07/29/2022 4:47:26 PM By: [name] Pax RN Entered By: [name] on 07/29/2022 16:04:27 [name], [name] ([id number]) -------------------------------------------------------------------------------- Pain Assessment Details Patient Name: [name], [name] Date of Service: 07/29/2022 3:30 PM Medical Record Number: [id number] Patient Account Number: [id number] Date of Birth/Sex: [birth year] (61 y.o. F) Treating RN: [name] Primary Care Lestat Golob: [name] Other Clinician: Referring Lizeth Bencosme: [name], [name] Treating Elianna Windom/Extender: [name] in Treatment: 78 Active Problems Location of Pain Severity and Description of Pain Patient Has Paino No Site Locations Pain Management and Medication Current Pain Management: Electronic Signature(s) Signed: 07/29/2022 4:47:26 PM By: [name] Pax RN Entered By: [name] on 07/29/2022 16:00:45 [name], [name] Salena Saner ([id number]) -------------------------------------------------------------------------------- Patient/Caregiver Education Details Patient Name: Alden, Kaveri [name]. Date of  Service: 07/29/2022 3:30 PM Medical Record Number: [id number] Patient Account Number: [id number] Date of Birth/Gender: September 28, 1961 (61 y.o. F) Treating RN: [name] Primary Care Physician: [name] Other Clinician: Referring Physician: Card, [name] Treating Physician/Extender: [name] in Treatment: 66 Education Assessment Education Provided To: Patient Education Topics Provided Wound/Skin Impairment: Methods: Explain/Verbal Responses: State content correctly Electronic Signature(s) Signed: 07/29/2022 4:47:26 PM By: [name] Pax RN Entered By: [name] on 07/29/2022 16:21:05 [name], [name] ([id number]) -------------------------------------------------------------------------------- Wound Assessment Details Patient Name: Wierman, Gwynne [name]. Date of Service: 07/29/2022 3:30 PM Medical Record Number: [id number] Patient Account Number: [id number] Date of Birth/Sex: 04-16-1961 (61 y.o. F) Treating RN: [name] Primary Care Aliyanna Wassmer: [name] Other Clinician: Referring Notnamed Croucher: [name], [name] Treating Aalaya Yadao/Extender: [name] in Treatment: 41 Wound Status Wound Number: 1 Primary Etiology: Dehisced Wound Wound Location: Distal, Midline Back Wound Status: Open Wounding Event: Surgical Injury Comorbid History: Hypertension Date Acquired: 04/11/2021 Weeks Of Treatment: 41 Clustered Wound: No Photos Wound Measurements Length: (cm) 0.3 Width: (cm) 0.3 Depth: (cm) 2.5 Area: (cm)  0.071 Volume: (cm) 0.177 % Reduction in Area: 43.7% % Reduction in Volume: 69.4% Epithelialization: None Tunneling: No Undermining: Yes Starting Position (o'clock): 9 Ending Position (o'clock): 3 Maximum Distance: (cm) 3.2 Wound Description Classification: Full Thickness Without Exposed Support Structu Exudate Amount: Medium Exudate Type: Serosanguineous Exudate Color: red, brown res Foul Odor After Cleansing: No Slough/Fibrino No Wound Bed Granulation Amount:  Large (67-100%) Exposed Structure Granulation Quality: Red Fascia Exposed: No Necrotic Amount: None Present (0%) Fat Layer (Subcutaneous Tissue) Exposed: Yes Tendon Exposed: No Muscle Exposed: No Joint Exposed: No Bone Exposed: No Electronic Signature(s) Signed: 08/05/2022 2:28:43 PM By: [name] Pax RN Previous Signature: 07/29/2022 4:47:26 PM Version By: [name] Pax RN Entered By: [name] on 08/05/2022 14:28:43 Wiltse, Sabrina [name]. ([id number]) -------------------------------------------------------------------------------- Vitals Details Patient Name: Shave, Danajah [name]. Date of Service: 07/29/2022 3:30 PM Medical Record Number: [id number] Patient Account Number: [id number] Date of Birth/Sex: 01-15-1961 (61 y.o. F) Treating RN: [name] Primary Care Yang Rack: [name] Other Clinician: Referring Crystallynn Noorani: [name], [name] Treating Maudean Hoffmann/Extender: [name] in Treatment: 35 Vital Signs Time Taken: 16:00 Temperature (F): 98.4 Height (in): 58 Pulse (bpm): 88 Weight (lbs): 275 Respiratory Rate (breaths/min): 18 Body Mass Index (BMI): 57.5 Blood Pressure (mmHg): 125/79 Reference Range: 80 - 120 mg / dl Electronic Signature(s) Signed: 07/29/2022 4:47:26 PM By: [name] Pax RN Entered By: [name] on 07/29/2022 16:00:32

## 2022-07-30 ENCOUNTER — Ambulatory Visit: Payer: 59 | Admitting: Physician Assistant

## 2022-08-01 ENCOUNTER — Ambulatory Visit: Payer: 59

## 2022-08-02 ENCOUNTER — Encounter: Payer: 59 | Admitting: Occupational Therapy

## 2022-08-05 ENCOUNTER — Encounter: Payer: 59 | Attending: Physician Assistant | Admitting: Physician Assistant

## 2022-08-05 DIAGNOSIS — G35 Multiple sclerosis: Secondary | ICD-10-CM | POA: Insufficient documentation

## 2022-08-05 DIAGNOSIS — L98422 Non-pressure chronic ulcer of back with fat layer exposed: Secondary | ICD-10-CM | POA: Diagnosis not present

## 2022-08-05 DIAGNOSIS — T8131XA Disruption of external operation (surgical) wound, not elsewhere classified, initial encounter: Secondary | ICD-10-CM | POA: Insufficient documentation

## 2022-08-05 DIAGNOSIS — Y838 Other surgical procedures as the cause of abnormal reaction of the patient, or of later complication, without mention of misadventure at the time of the procedure: Secondary | ICD-10-CM | POA: Diagnosis not present

## 2022-08-05 DIAGNOSIS — I10 Essential (primary) hypertension: Secondary | ICD-10-CM | POA: Diagnosis not present

## 2022-08-05 NOTE — Progress Notes (Signed)
[name], [name] ([id number]) Visit Report for 08/05/2022 Chief Complaint Document Details Patient Name: [name], [name] Date of Service: 08/05/2022 2:00 PM Medical Record Number: [id number] Patient Account Number: [id number] Date of Birth/Sex: May 30, 1961 (61 y.o. F) Treating RN: Yevonne Pax Primary Care Provider: [name] Other Clinician: Referring Provider: Card, John Treating Provider/Extender: Rowan Blase in Treatment: 17 Information Obtained from: Patient Chief Complaint Surgical Back Ulcer Electronic Signature(s) Signed: 08/05/2022 2:04:13 PM By: [name] PA-C Entered By: [name] on 08/05/2022 14:04:13 Deerman, Fallyn CMarland Kitchen ([id number]) -------------------------------------------------------------------------------- HPI Details Patient Name: [name], [name] Date of Service: 08/05/2022 2:00 PM Medical Record Number: [id number] Patient Account Number: [id number] Date of Birth/Sex: [birth year] (61 y.o. 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 This was actually broken up into 3 separate surgical events. The initial surgical intervention actually was on November 05, 2021 almost a year ago. Subsequently the patient went back in February for a seroma of the area which unfortunately required her to have a repeat surgery to go in and clean this out. And then again this occurred in April where she went back in and again they felt like stitches were coming out and there was an additional seroma. She was placed in a wound VAC initially and then subsequently as it got smaller that was  discontinued. Again right now I will see anything that I think a wound VAC would help with. Nonetheless she definitely has a significant depth to the wound that is going require packing. I actually believe the Hydrofera Blue rope would probably do quite well with this the problem is as much as it is draining she probably needs this to be changed at least every day. She does not really have anyone that can help with that that is the complicating scenario here. With that being said the patient does have a history of multiple sclerosis, hypertension, and again this surgical wound dehiscence in regard to her lumbar spine region. She did have a repeat MRI which was actually completed 10/09/2021. This showed that there was no significant change in the subcutaneous fluid collection/track of the lower lumbar region. This is extending to the level of the fascia unfortunately. This seems to go all the way from the L2 level with a track extending all the way to the fascia at the L4-5 level. Again this is a significant wound and there is significant drainage but does not seem to communicate to the spinal region as far as spinal fluid or otherwise is concerned that is good news. Nonetheless she last saw Dr. Franky Macho who is her neurosurgeon on 10/01/2021 that was when he ordered this last MRI she supposed to see him next week as well. With that being said he did not feel like there was any significant issue there but was not sure why this was not healing that is when he ordered the MRI. They were wanting to make sure that this was packed appropriately by home health unfortunately the main issue currently is that home health is completely out of the picture as the patient has exhausted all the home health that that she gets for a year. She is now in a very difficult  predicament where she does not have anyone to help her change the dressing and to be honest that she is not able to do it herself with the location of the  wound being on the midline lumbar spine region. If she does not have anyone that can help it is probably can to be necessary for her to go to a facility for rehab and daily dressing changes as I feel like daily changes which is much drainage that she is having is going to be necessary. 10/23/2021 upon evaluation today patient appears to be doing decently well in regard to her back ulcer. This does seem to be draining a lot less than what it is been doing in the past. With that being said she still has quite a bit of drainage nonetheless. I do think that given time this should improve least I hope so. The good news is she does have home health coming out 3 days a week were doing it 2 days a week and she is paying someone we can to help. 10/30/2021 upon evaluation today patient appears to be doing okay in regard to her back ulcer this is not draining quite as bad as it was in the beginning but he still has quite a bit of drainage noted. I do believe that the patient would benefit from [place] going ahead forward with attempting a wound VAC using the Hydrofera Blue rope to pack with and then subsequently using the VAC externally to actually suction out and help this to fill- in. I think this is our ideal way to try to get things cleared at this point. As it stands I am not certain that we are really making a progress that we want to see near with doing it in the way we are which is packing with the rope. It is a good dressing but I do think it is insufficient for total healing. She just seems to have too much in the way of drainage at this point unfortunately. 11/06/2021 upon evaluation today patient unfortunately continues to have issues with her back ongoing. The good news is her MRI that was repeated showed signs of the size of this area in the lumbar spine region having decreased from 4 cm to 3.5 cm this is definitely not bad news at all. With that being said unfortunately she continues to have issues with  ongoing drainage not as severe as in the beginning but nonetheless still significant. I do think a wound VAC still would be a good way to go although her home health agency nurse apparently has some concerns about the possibility of not being able to keep a seal with this as they had struggles in the past. Nonetheless I explained to the patient that this is much different than what she had previous and that I really feel like it would do much better as far as getting the area taken care of without having any complications or issues here. I think that we should be able to maintain a seal. Nonetheless at this time I did discuss with the patient as well that she probably does need to have a wound VAC in order for [place] to get this moving in the right direction. 11/13/2021 upon evaluation patient's wound bed actually showed signs of significant drainage at this time. She did see the surgeon yesterday he did not see anything that appeared to be infected. Nonetheless he does appear that she is continuing to have areas here that just do not seem  to want to seal up there MRI findings have been negative but nonetheless she continues to have is the seroma that is filling in. I do feel like we need to try to widen the hole so we can get at least a half of the [facility] then this will be better than nothing at this point. 11/20/2021 upon evaluation today patient actually appears to be having less pain at this point which is good news and overall she we still do not have the results of the culture back yet it had to be sent out to [facility] and we do not have the result back yet. Is doing decently well in regard to her wound. Fortunately there does not appear to be any signs of active infection systemically nor locally at this time. 11/27/2021 upon evaluation today patient appears to be doing well with regard to her wound all things considered there does appear to be less drainage than there was previous.  Fortunately I do not see any evidence of worsening of the patient is stating that she is having some issues with back pain. This is somewhat new. Again this I think could be related to the fact that she is having some issues here with infection. We are still waiting to see what the result of her culture shows from susceptibility testing Enterococcus has been identified but we do not know if this is VRE or not. 12/04/2021 upon evaluation today patient appears to be doing well with regard to her wound all things considered. I did have a conversation with Erin from Dr. Sueanne Margarita office. Of note she notes that Dr. Drinda Butts really does not want to do anything surgical right now which I completely understand. With that being said I am still leery of how far we will make it getting this to heal short of any type of surgery to open this up and allow [place] to more appropriately packed the wound. Nonetheless I will absolutely give it our best shot as far as that is concerned. I discussed that with the patient today. She voiced understanding. The good news is the drainage today seems to be clear it is no longer cloudy as it was previous I am actually very pleased in that regard. SHAQUANTA, HARKLESS ([id number]) 12/11/2021 since have last seen the patient actually did have an conversation with Dr. Franky Macho with her neurosurgeon. Again this involve the discussion around whether or not to open the wound and try to apply a wound VAC following. With that being said the decision was made that that may be the best thing to do if the patient was in agreement. Nonetheless I am actually extremely encouraged with what I am seeing today much more than I would have thought. In fact I think that we may be over packing the wound which is why she is having some discomfort at this point as there is much less of the dressing able to get and this time compared to what we saw previous. That is actually really good news. In fact the 6:00  tunnel appears to have filled in is awesome news. At the 12:00 location I am actually able to pack into that area pretty effectively at this point today. In fact I was able to get less of the packing in which I will detail below. 12/18/2021 upon evaluation today patient appears to be doing well with regard to her wound on the back. Fortunately there is no signs of active infection which is great news and overall  very pleased with where things stand today. No fevers, chills, nausea, vomiting, or diarrhea. 01/01/2022 upon evaluation today patient appears to be doing decently well in regard to her wound. Fortunately there does not appear to be any signs of anything worsening which is great news. She still continues to have issues with drainage but this is not nearly as significant as what it was in the past. There is all clear drainage no signs of purulence noted. 01/08/2022 upon evaluation today patient appears to be doing well with regard to her wound. With that being said she tells me that she has been having some increased pain over the past week. It does appear based on the amount of Hydrofera Blue that we removed this is probably over pack. Again it is difficult to know exactly how old the nurses getting all the skin but nonetheless the length of Hydrofera Blue that was utilized was way too much which may be part of the reason why this felt so uncomfortable. Fortunately I think that we can cut back on that we discussed pieces smaller so hopefully that will not be over packed. 01/22/2021 upon evaluation today patient appears to be doing well With regard to there being no signs of infection at this time. The wound does still tunnel mainly up at the 12:00 location. The depth of the tunnel complete from entry point to the base is about 3.5 cm today. With that being said I do believe that overall she is making good progress this is just a very slow process for her which I know has been frustrating as  well. 01/29/2022 upon evaluation today patient appears to be doing well with regard to the wound on her lower back and the lumbar spine region. The good news is the depth that I got this week was a little bit less going up at the 12:00 location as compared to last week. I measured this to be 3.5 cm last week and it was 3.3 cm this week. Again that is a minute shift but nonetheless a good shift in the right direction. Overall I think that we are on the right track. 02/05/2022 upon evaluation today patient appears to be doing well with regard to her wound. In fact right now her measurements are somewhere around 2.9 cm which is definitely smaller and less deep than last week At the 12:00 location. Overall I am very pleased with what we are seeing. 02/19/2022 upon evaluation today patient unfortunately is noting that the wound is actually closing up externally but internally there is still some depth to this. I discussed with her today that we cannot let it close up like this is can end up being a significant issue for her if we allow for that. Subsequently we need to do what we can to try to open this up and keep it open more effectively. She voiced understanding. With that being said we are going to proceed with that procedure today in order to debride away some of the skin on externally that is trying to close and on this. 02/26/2022 upon evaluation patient appears to be doing better in my opinion after we had to open up this area last week. Again she has a significant amount of healing and overall I am extremely pleased with where things stand currently. I do not see any evidence of active infection locally nor systemically at this time which is great news and in general I think that we are on the right track for getting  this hopefully closed final and done. 03/05/2022 upon evaluation today patient unfortunately is doing a little bit worse with regard to the whole size this is starting to get much  smaller unfortunately. There does not appear to be any signs of active infection locally nor systemically which is great news. With that being said I am concerned about the fact that the patient is doing quite a bit worse with regard to trapping some fluid and almost feels like she may have a fluid pocket that not only goes in and up towards 12:00 but looks back around towards the more superficial subcutaneous tissue. I am very concerned that this is going to be something that is very hard for [place] to heal short of another surgery to open this up and try to wound VAC from the inside out. Even then there is no guarantees this is just a very difficult situation to be perfectly honest. 03/14/2022 upon evaluation today patient appears to be doing well with regard to her wound as far as the area staying open and pleased in that regard. With that being said unfortunately she is not doing as well when it comes to the irritation around it appears to be very inflamed and red this is not good that was discussed with her today as well. Subsequently I do believe that working to need to do what we can do to try and calm this down in the past she did well with the Augmentin due to Enterococcus infection I am not sure if were dealing with the same thing or not but at the same time I think that is probably to be my go to at this point. 03/19/2022 upon evaluation today patient appears to be doing really about the same in regard to her wound that the pain is better. 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 She seems to be improved dramatically with the antibiotic currently although it does not cover for MRSA I am apt to just see how this progresses over the next several days to a week. With that being said the bigger issue here is that we simply do not seem to be making excellent progress and I am concerned about the lack of progress. I discussed with Dr. Shon Baton with [facility] in [place]  and to be honest his recommendation was that the best bet would probably be to get her to a teaching center. She is actually been seen for rheumatology and a I believe psychologist/psychiatrist at [facility]. She would prefer to go that direction as opposed to [facility] or [facility]. I am definitely okay with that. 03/26/2022 upon evaluation today patient appears to be doing okay in regard to her wound. She is not showing signs of worsening and overall she tells me that her pain is not nearly as bad as it was previous. Fortunately I do not see any evidence of active infection locally or systemically which is great news. No fevers, chills, nausea, vomiting, or diarrhea. 04-02-2022 upon evaluation today patient appears to be doing well with regard to her wound. It does not appear to be showing any signs of worsening which is great news. With that being said you are still having some issues here with drainage although it is clear and mainly just tenderness with bleeding from the [facility] I do think that treating for the second issue which is MRSA is probably the right thing to do at this point she is now off of the Augmentin. 04-09-2022 upon  evaluation today patient's wound actually appears to be doing about the same. Fortunately I do not see any evidence of active infection locally or systemically at this time which is great news. No fevers, chills, nausea, vomiting, or diarrhea. 04-16-2022 upon evaluation today patient appears to be doing well with regard to her wound in the lumbar spine region. Subsequently she continues to have an area that does have a tunnel up into the 12:00 location. This is about 3.5 cm using a skinny probe curved upwards to get into this region. With that being said I really do not see any signs of overall improvement but also do not see any signs of worsening I feel like work on about a still made here to some degree. The patient did see her neurologist and he has referred her to  neurosurgery at [facility] for second ULYANA, HARDBARGER. ([id number]) opinion we will see what they have to say as well. 04-23-2022 upon evaluation today patient appears to be doing well with regard to her wound all things considered. She has been tolerating the dressing changes without complication. Fortunately I do not see any signs of active infection locally nor systemically which is great news. No fever chills noted 04-30-2022 upon evaluation today patient appears to be doing about the same in regard to her wound. The depth is really not dramatically improved as far as the 12:00 tunnel is concerned. The overall depth and the base of the wound does seem to be a little bit better it does sound like that external wound has closed as far as the opening is concerned we can have to cut this down from a half to a smaller size based on what we are seeing today. 05-07-2022 upon evaluation today patient's wound actually seems to be doing decently well. There is not a major shift here but a slight shift in the depth both straight and as well as in the [place] at 12:00. With that being said I again we will talk about a couple of millimeters but still every little bit can count when there are situations like this to be honest. 05-14-2022 upon evaluation today patient appears to be doing okay in regard to her wound. I do not see any signs of anything worsening. With that being said also do not see getting significantly better unfortunately. There does not appear to be any evidence of infection locally or systemically which is great news. No fevers, chills, nausea, vomiting, or diarrhea. 05-21-2022 upon evaluation today patient actually appears to be doing quite well in regard to her wound from the standpoint of there being no infection. With that being said there does not appear to be any evidence of infection locally nor systemically which is great news. No fevers, chills, nausea, vomiting, or diarrhea. 05-28-2022  upon evaluation today patient's wound actually appears to be doing about the same. I do not see any evidence of active infection locally or systemically which is great news. No fevers, chills, nausea, vomiting, or diarrhea. 06-04-2022 upon evaluation today patient appears to be doing well with regard to her wound. She has been tolerating dressing changes without complication. Fortunately I do not feel like there is any worsening also I do not feel like there is any improvement. I feel like right a solid area here where she has a tunnel that tracks up at 12:00 and then has a fluid pocket at around roughly 1:00 I can push on this and squeeze fluid out. Again I am not exactly sure  what else can be done from my standpoint to improve this. She does have an appointment with Dr. Christella Noa who I do believe is an excellent surgeon and this is the surgeon who performed this surgery initially. Again the biggest issue was following she had a lot of trouble with the wound VAC I do think that we need to try to see if she is can have a wound VAC following surgery what exactly regular need to do to help keep this moving in the right direction for her. Obviously I want her to have the best result possible that she has to go through surgery again to clean this area out. Absolutely willing to help out with getting this to heal. 06-13-2022 upon evaluation today patient appears to be doing well with regard to her wound its not any worse unfortunately it is also not significantly better which is the only issue currently. I do not see any signs of active infection locally or systemically which is great news. No fevers, chills, nausea, vomiting, or diarrhea. She does have her appointment on Monday with her neurosurgeon and we will see you Dr. Christella Noa has to say at that point. 06-18-2022 upon evaluation patient appears to be doing a little worse in regard to her wound only in the respect that has been several days since she has had  this changed in fact it was last changed on Friday. Since that time she tells me that she has kept this in place over the weekend and yesterday she was supposed to see her neurosurgeon yesterday but they had to cancel due to an emergency surgery according to what the patient tells me. Nonetheless she is having some issues here with drainage coming from the wound that is more purulent in nature I think this is something we have been seeing in the [facility] for several weeks but is more obvious being that it was not changed as much during the last week. She unfortunately is a little worried about this but again I think that this is just part of what we have been dealing with all long is more prevalent and obvious due to the length of time since it was changed last. She does have a repeat appointment for I believe July 10 she told me although she is going to see if she can find anything sooner. 06-25-2022 upon evaluation today patient's wound actually showed signs of marginal improvement which is good news. She still has the area of abscess that seems to be at roughly 2:00 when looking at her back. We can get into this with the skinny probe other than that I really do not know what else I can do to try to make this better. We discussed this and she does have an appointment with her neurosurgeon although that is not till mid July as he had to change it at the last moment during the time she was post to see him a week ago. She does have evidence of potential infection on culture I am going to go ahead and treat this in order to ensure that nothing worsens. With that being said I do believe that overall she seems to be doing well but I do think we want to make sure that that the knee gets worse in the meantime until she gets to see her neurosurgeon. 07-01-2022 upon evaluation today patient appears to continue to have trouble with her wound draining. Again we have been continue to monitor this and I still  see  the same issue that I have noted before the patient has an area which is draining seemingly from around the 2:00 location when you go up towards 12:00 and then over towards 2:00 looking at her back this is where there is actually a soft area external that if you push on it you will see fluid come out and subsequently this is also where it tracks to internally. Everything in the 6:00 location has completely healed in and this looks much better in that regard but this upper portion we just cannot get to closed with the methods that we have. It seemed to me that this is probably going to require that this area be open and cleaned out in order to allow it to heal and my suggestion would be that a wound VAC is probably going to be the ideal thing. 07-09-2022 upon evaluation today patient appears to be doing somewhat poorly in regard to her wound. She did see Dr. Franky Macho. He stated that the wound was not being "packed appropriately" according to the patient and her daughter who were present during the evaluation today as well as the evaluation with him yesterday. With that being said he packed the wound the way he said it needed to be and to be partly honest that is over packed. The patient is having a tremendous amount of discomfort and states that this is no way that she is good to be able to tolerate that. I understand the concern about the whole closing down which is why I recommended trying to keep this open is much as possible and again with that all being said this is still going to continue to be an issue as far as this closing down before everything heals up on the inside. Nonetheless she continues to have issues with significant drainage. She also has an area which almost feels like a small abscess or least of fluid pocket that feels up that was not appropriately packed with the dressings that were in place. With that being said I think that this is going to have to be opened at some point in  time in order to get this to heal I am really not certain the best way to go about this. I discussed that with the patient again today. 07-16-2022 upon evaluation today patient appears to be doing well currently in regard to her wound from the standpoint of the pain getting better which is great news. Fortunately I do not see any evidence of active infection locally or systemically which is great news. No fevers, chills, nausea, vomiting, or diarrhea. With that being said the patient has been tolerating the dressing changes better without complication which is great news. LAURELAI, LEPP ([id number]) 07-29-2022 upon evaluation today patient appears to be doing actually pretty well in regard to her wound. Fortunately there does not appear to be any signs of infection. She still has drainage and we still are seeing an area from this pocket up at 12:00 that is leaking but fortunately this does not appear to be having any significant issues with infection right now which is great news. Fortunately her pain is also calm down since I saw her 2 weeks ago. 08-05-2022 upon evaluation today patient appears to be doing unfortunately a little worse in regard to the discomfort she is feeling she tells me that this is bothering her pretty much all the time at a low level. It never seems to go away. This is since last week. With that being  said the overall depth is unfortunately a little bit deeper this week which is definitely not what I was hoping to see. I did get to review the second opinion that was requested by the patient from her insurance to have an independent physician review the situation here. The short story of that reviewed which I did read through the entirety of she states that the patient does likely need surgery to correct the seroma considering length of time this has been going on. It was recommended that a plastic surgeon be the one to have this up in case there is a muscle flap necessary but at  minimum that this area needs to be cleaned out and then appropriately close following. The patient has also read through this. She does seem to be a little bit off of her normal game not feeling quite that well today. She is not exactly sure what is going on. Electronic Signature(s) Signed: 08/05/2022 2:29:08 PM By: [name] PA-C Entered By: [name] on 08/05/2022 14:29:07 Quickel, Zahava Salena Saner ([id number]) -------------------------------------------------------------------------------- Physical Exam Details Patient Name: Pollan, [name] Date of Service: 08/05/2022 2:00 PM Medical Record Number: [id number] Patient Account Number: [id number] Date of Birth/Sex: Feb 19, 1961 (61 y.o. F) Treating RN: Yevonne Pax Primary Care Provider: [name] Other Clinician: Referring Provider: Card, John Treating Provider/Extender: Rowan Blase in Treatment: 8 Constitutional Obese and well-hydrated in no acute distress. Respiratory normal breathing without difficulty. Psychiatric this patient is able to make decisions and demonstrates good insight into disease process. Alert and Oriented x 3. pleasant and cooperative. Notes Upon inspection patient's wound bed showed signs of being a little bit deeper in regard to the 12:00 location today. This does seem to be draining still quite a bit as well. Fortunately I do not see any evidence of infection but I do think that she is continuing to have some issues here unfortunately this is getting to the point that I think it is wearing on her a bit. Electronic Signature(s) Signed: 08/05/2022 2:29:44 PM By: [name] PA-C Entered By: [name] on 08/05/2022 14:29:44 Doiron, Domingo Pulse ([id number]) -------------------------------------------------------------------------------- Physician Orders Details Patient Name: Cando, [name] Date of Service: 08/05/2022 2:00 PM Medical Record Number: [id number] Patient Account Number:  [id number] Date of Birth/Sex: 1961-07-10 (61 y.o. 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 o Nurse Visit as needed Home Health o Home Health Company: [facility] o [facility] for wound care. May utilize formulary equivalent dressing for wound treatment orders unless otherwise specified. Home Health Nurse may visit PRN to address patientos wound care needs. - Monday and Friday [facility] fax [phone] Bathing/ Shower/ Hygiene o May shower; gently cleanse wound with antibacterial soap, rinse and pat dry prior to dressing wounds o No tub bath. Anesthetic (Use 'Patient Medications' Section for Anesthetic Order Entry) o Lidocaine applied to wound bed Edema Control - Lymphedema / Segmental Compressive Device / Other o Elevate, Exercise Daily and Avoid Standing for Long Periods of Time. o Elevate legs to the level of the heart and pump ankles as often as possible o Elevate leg(s) parallel to  the floor when sitting. Additional Orders / Instructions o Follow Nutritious Diet and Increase Protein Intake Wound Treatment Wound #1 - Back Wound Laterality: Midline, Distal Cleanser: Normal Saline 1 x Per Day/30 Days Discharge Instructions: Wash your hands with soap and water. Remove old dressing, discard into plastic bag and place into trash. Cleanse the wound with Normal Saline prior to applying a clean dressing using gauze sponges, not tissues or cotton balls. Do not scrub or use  excessive force. Pat dry using gauze sponges, not tissue or cotton balls. Primary Dressing: Hydrofera Blue Rope 1 x Per Day/30 Days Discharge Instructions: cut into fourths; angle the end Secondary Dressing: (SILICONE BORDER) Zetuvit Plus SILICONE BORDER Dressing 4x4 (in/in) 1 x Per Day/30 Days Electronic Signature(s) Unsigned Entered ByYevonne Pax on 08/05/2022 14:15:10 Signature(s): Date(s): Stirewalt, Trachelle C. ([id number]) -------------------------------------------------------------------------------- Problem List Details Patient Name: Riggin, [name] Date of Service: 08/05/2022 2:00 PM Medical Record Number: [id number] Patient Account Number: [id number] Date of Birth/Sex: 23-May-1961 (61 y.o. 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 ([id number]) -------------------------------------------------------------------------------- Progress Note Details Patient Name: Givans, [name] Date of Service: 08/05/2022 2:00 PM Medical Record Number: [id number] Patient Account Number: [id number] Date of Birth/Sex: 13-Nov-1961 (61 y.o. F) Treating RN: Yevonne Pax Primary Care Provider: [name] Other Clinician: Referring Provider: Card, John Treating Provider/Extender: Rowan Blase in  Treatment: 42 Subjective Chief Complaint Information obtained from Patient Surgical Back Ulcer History of Present Illness (HPI) 10/15/2021 upon evaluation today patient presents for initial evaluation here in the clinic concerning a surgical ulceration/dehiscence in the lumbar spine region following surgery that she had over the past year. This was actually broken up into 3 separate surgical events. The initial surgical intervention actually was on November 05, 2021 almost a year ago. Subsequently the patient went back in February for a seroma of the area which unfortunately required her to have a repeat surgery to go in and clean this out. And then again this occurred in April where she went back in and again they felt like stitches were coming out and there was an additional seroma. She was placed in a wound VAC initially and then subsequently as it got smaller that was discontinued. Again right now I will see anything that I think a wound VAC would help with. Nonetheless she definitely has a significant depth to the wound that is going require packing. I actually believe the Hydrofera Blue rope would probably do quite well with this the problem is as much as it is draining she probably needs this to be changed at least every day. She does not really have anyone that can help with that that is the complicating scenario here. With that being said the patient does have a history of multiple sclerosis, hypertension, and again this surgical wound dehiscence in regard to her lumbar spine region. She did have a repeat MRI which was actually completed 10/09/2021. This showed that there was no significant change in  the subcutaneous fluid collection/track of the lower lumbar region. This is extending to the level of the fascia unfortunately. This seems to go all the way from the L2 level with a track extending all the way to the fascia at the L4-5 level. Again this is a significant wound and there is  significant drainage but does not seem to communicate to the spinal region as far as spinal fluid or otherwise is concerned that is good news. Nonetheless she last saw Dr. Franky Macho who is her neurosurgeon on 10/01/2021 that was when he ordered this last MRI she supposed to see him next week as well. With that being said he did not feel like there was any significant issue there but was not sure why this was not healing that is when he ordered the MRI. They were wanting to make sure that this was packed appropriately by home health unfortunately the main issue currently is that home health is completely out of the picture as the patient has exhausted all the home health that that she gets for a year. She is now in a very difficult predicament where she does not have anyone to help her change the dressing and to be honest that she is not able to do it herself with the location of the wound being on the midline lumbar spine region. If she does not have anyone that can help it is probably can to be necessary for her to go to a facility for rehab and daily dressing changes as I feel like daily changes which is much drainage that she is having is going to be necessary. 10/23/2021 upon evaluation today patient appears to be doing decently well in regard to her back ulcer. This does seem to be draining a lot less than what it is been doing in the past. With that being said she still has quite a bit of drainage nonetheless. I do think that given time this should improve least I hope so. The good news is she does have home health coming out 3 days a week were doing it 2 days a week and she is paying someone we can to help. 10/30/2021 upon evaluation today patient appears to be doing okay in regard to her back ulcer this is not draining quite as bad as it was in the beginning but he still has quite a bit of drainage noted. I do believe that the patient would benefit from [place] going ahead forward with attempting  a wound VAC using the Hydrofera Blue rope to pack with and then subsequently using the VAC externally to actually suction out and help this to fill- in. I think this is our ideal way to try to get things cleared at this point. As it stands I am not certain that we are really making a progress that we want to see near with doing it in the way we are which is packing with the rope. It is a good dressing but I do think it is insufficient for total healing. She just seems to have too much in the way of drainage at this point unfortunately. 11/06/2021 upon evaluation today patient unfortunately continues to have issues with her back ongoing. The good news is her MRI that was repeated showed signs of the size of this area in the lumbar spine region having decreased from 4 cm to 3.5 cm this is definitely not bad news at all. With that being said unfortunately she continues to have issues with ongoing drainage not  as severe as in the beginning but nonetheless still significant. I do think a wound VAC still would be a good way to go although her home health agency nurse apparently has some concerns about the possibility of not being able to keep a seal with this as they had struggles in the past. Nonetheless I explained to the patient that this is much different than what she had previous and that I really feel like it would do much better as far as getting the area taken care of without having any complications or issues here. I think that we should be able to maintain a seal. Nonetheless at this time I did discuss with the patient as well that she probably does need to have a wound VAC in order for [place] to get this moving in the right direction. 11/13/2021 upon evaluation patient's wound bed actually showed signs of significant drainage at this time. She did see the surgeon yesterday he did not see anything that appeared to be infected. Nonetheless he does appear that she is continuing to have areas here that  just do not seem to want to seal up there MRI findings have been negative but nonetheless she continues to have is the seroma that is filling in. I do feel like we need to try to widen the hole so we can get at least a half of the [facility] then this will be better than nothing at this point. 11/20/2021 upon evaluation today patient actually appears to be having less pain at this point which is good news and overall she we still do not have the results of the culture back yet it had to be sent out to [facility] and we do not have the result back yet. Is doing decently well in regard to her wound. Fortunately there does not appear to be any signs of active infection systemically nor locally at this time. 11/27/2021 upon evaluation today patient appears to be doing well with regard to her wound all things considered there does appear to be less drainage than there was previous. Fortunately I do not see any evidence of worsening of the patient is stating that she is having some issues with back pain. This is somewhat new. Again this I think could be related to the fact that she is having some issues here with infection. We are still waiting to see what the result of her culture shows from susceptibility testing Enterococcus has been identified but we do not know if this is VRE or not. 12/04/2021 upon evaluation today patient appears to be doing well with regard to her wound all things considered. I did have a conversation with Erin from Dr. Sueanne Margarita office. Of note she notes that Dr. Drinda Butts really does not want to do anything surgical right now which I completely Quach, [name] ([id number]) understand. With that being said I am still leery of how far we will make it getting this to heal short of any type of surgery to open this up and allow [place] to more appropriately packed the wound. Nonetheless I will absolutely give it our best shot as far as that is concerned. I discussed that with the patient  today. She voiced understanding. The good news is the drainage today seems to be clear it is no longer cloudy as it was previous I am actually very pleased in that regard. 12/11/2021 since have last seen the patient actually did have an conversation with Dr. Franky Macho with her neurosurgeon. Again this involve  the discussion around whether or not to open the wound and try to apply a wound VAC following. With that being said the decision was made that that may be the best thing to do if the patient was in agreement. Nonetheless I am actually extremely encouraged with what I am seeing today much more than I would have thought. In fact I think that we may be over packing the wound which is why she is having some discomfort at this point as there is much less of the dressing able to get and this time compared to what we saw previous. That is actually really good news. In fact the 6:00 tunnel appears to have filled in is awesome news. At the 12:00 location I am actually able to pack into that area pretty effectively at this point today. In fact I was able to get less of the packing in which I will detail below. 12/18/2021 upon evaluation today patient appears to be doing well with regard to her wound on the back. Fortunately there is no signs of active infection which is great news and overall very pleased with where things stand today. No fevers, chills, nausea, vomiting, or diarrhea. 01/01/2022 upon evaluation today patient appears to be doing decently well in regard to her wound. Fortunately there does not appear to be any signs of anything worsening which is great news. She still continues to have issues with drainage but this is not nearly as significant as what it was in the past. There is all clear drainage no signs of purulence noted. 01/08/2022 upon evaluation today patient appears to be doing well with regard to her wound. With that being said she tells me that she has been having some increased pain  over the past week. It does appear based on the amount of Hydrofera Blue that we removed this is probably over pack. Again it is difficult to know exactly how old the nurses getting all the skin but nonetheless the length of Hydrofera Blue that was utilized was way too much which may be part of the reason why this felt so uncomfortable. Fortunately I think that we can cut back on that we discussed pieces smaller so hopefully that will not be over packed. 01/22/2021 upon evaluation today patient appears to be doing well With regard to there being no signs of infection at this time. The wound does still tunnel mainly up at the 12:00 location. The depth of the tunnel complete from entry point to the base is about 3.5 cm today. With that being said I do believe that overall she is making good progress this is just a very slow process for her which I know has been frustrating as well. 01/29/2022 upon evaluation today patient appears to be doing well with regard to the wound on her lower back and the lumbar spine region. The good news is the depth that I got this week was a little bit less going up at the 12:00 location as compared to last week. I measured this to be 3.5 cm last week and it was 3.3 cm this week. Again that is a minute shift but nonetheless a good shift in the right direction. Overall I think that we are on the right track. 02/05/2022 upon evaluation today patient appears to be doing well with regard to her wound. In fact right now her measurements are somewhere around 2.9 cm which is definitely smaller and less deep than last week At the 12:00 location. Overall I am  very pleased with what we are seeing. 02/19/2022 upon evaluation today patient unfortunately is noting that the wound is actually closing up externally but internally there is still some depth to this. I discussed with her today that we cannot let it close up like this is can end up being a significant issue for her if we allow for  that. Subsequently we need to do what we can to try to open this up and keep it open more effectively. She voiced understanding. With that being said we are going to proceed with that procedure today in order to debride away some of the skin on externally that is trying to close and on this. 02/26/2022 upon evaluation patient appears to be doing better in my opinion after we had to open up this area last week. Again she has a significant amount of healing and overall I am extremely pleased with where things stand currently. I do not see any evidence of active infection locally nor systemically at this time which is great news and in general I think that we are on the right track for getting this hopefully closed final and done. 03/05/2022 upon evaluation today patient unfortunately is doing a little bit worse with regard to the whole size this is starting to get much smaller unfortunately. There does not appear to be any signs of active infection locally nor systemically which is great news. With that being said I am concerned about the fact that the patient is doing quite a bit worse with regard to trapping some fluid and almost feels like she may have a fluid pocket that not only goes in and up towards 12:00 but looks back around towards the more superficial subcutaneous tissue. I am very concerned that this is going to be something that is very hard for [place] to heal short of another surgery to open this up and try to wound VAC from the inside out. Even then there is no guarantees this is just a very difficult situation to be perfectly honest. 03/14/2022 upon evaluation today patient appears to be doing well with regard to her wound as far as the area staying open and pleased in that regard. With that being said unfortunately she is not doing as well when it comes to the irritation around it appears to be very inflamed and red this is not good that was discussed with her today as well. Subsequently I do  believe that working to need to do what we can do to try and calm this down in the past she did well with the Augmentin due to Enterococcus infection I am not sure if were dealing with the same thing or not but at the same time I think that is probably to be my go to at this point. 03/19/2022 upon evaluation today patient appears to be doing really about the same in regard to her wound that the pain is better. 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 She seems to be improved dramatically with the antibiotic currently although it does not cover for MRSA I am apt to just see how this progresses over the next several days to a week. With that being said the bigger issue here is that we simply do not seem to be making excellent progress and I am concerned about the lack of progress. I discussed with Dr. Shon Baton with [facility] in [place] and to be honest his recommendation was that the best bet would probably be to  get her to a teaching center. She is actually been seen for rheumatology and a I believe psychologist/psychiatrist at [facility]. She would prefer to go that direction as opposed to [facility] or [facility]. I am definitely okay with that. 03/26/2022 upon evaluation today patient appears to be doing okay in regard to her wound. She is not showing signs of worsening and overall she tells me that her pain is not nearly as bad as it was previous. Fortunately I do not see any evidence of active infection locally or systemically which is great news. No fevers, chills, nausea, vomiting, or diarrhea. 04-02-2022 upon evaluation today patient appears to be doing well with regard to her wound. It does not appear to be showing any signs of worsening which is great news. 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 04-09-2022 upon evaluation today patient's wound actually appears to be doing about the same. Fortunately I do not see any evidence of active infection locally or systemically at this time which is great news. No fevers, chills, nausea, vomiting, or diarrhea. [name], [name] ([id number]) 04-16-2022 upon evaluation today patient appears to be doing well with regard to her wound in the lumbar spine region. Subsequently she continues to have an area that does have a tunnel up into the 12:00 location. This is about 3.5 cm using a skinny probe curved upwards to get into this region. With that being said I really do not see any signs of overall improvement but also do not see any signs of worsening I feel like work on about a still made here to some degree. The patient did see her neurologist and he has referred her to neurosurgery at [facility] for second opinion we will see what they have to say as well. 04-23-2022 upon evaluation today patient appears to be doing well with regard to her wound all things considered. She has been tolerating the dressing changes without complication. Fortunately I do not see any signs of active infection locally nor systemically which is great news. No fever chills noted 04-30-2022 upon evaluation today patient appears to be doing about the same in regard to her wound. The depth is really not dramatically improved as far as the 12:00 tunnel is concerned. The overall depth and the base of the wound does seem to be a little bit better it does sound like that external wound has closed as far as the opening is concerned we can have to cut this down from a half to a smaller size based on what we are seeing today. 05-07-2022 upon evaluation today patient's wound actually seems to be doing decently well. There is not a major shift here but a slight shift in the depth both straight and as well as in the  [place] at 12:00. With that being said I again we will talk about a couple of millimeters but still every little bit can count when there are situations like this to be honest. 05-14-2022 upon evaluation today patient appears to be doing okay in regard to her wound. I do not see any signs of anything worsening. With that being said also do not see getting significantly better unfortunately. There does not appear to be any evidence of infection  locally or systemically which is great news. No fevers, chills, nausea, vomiting, or diarrhea. 05-21-2022 upon evaluation today patient actually appears to be doing quite well in regard to her wound from the standpoint of there being no infection. With that being said there does not appear to be any evidence of infection locally nor systemically which is great news. No fevers, chills, nausea, vomiting, or diarrhea. 05-28-2022 upon evaluation today patient's wound actually appears to be doing about the same. I do not see any evidence of active infection locally or systemically which is great news. No fevers, chills, nausea, vomiting, or diarrhea. 06-04-2022 upon evaluation today patient appears to be doing well with regard to her wound. She has been tolerating dressing changes without complication. Fortunately I do not feel like there is any worsening also I do not feel like there is any improvement. I feel like right a solid area here where she has a tunnel that tracks up at 12:00 and then has a fluid pocket at around roughly 1:00 I can push on this and squeeze fluid out. Again I am not exactly sure what else can be done from my standpoint to improve this. She does have an appointment with Dr. Franky Macho who I do believe is an excellent surgeon and this is the surgeon who performed this surgery initially. Again the biggest issue was following she had a lot of trouble with the wound VAC I do think that we need to try to see if she is can have a wound VAC following  surgery what exactly regular need to do to help keep this moving in the right direction for her. Obviously I want her to have the best result possible that she has to go through surgery again to clean this area out. Absolutely willing to help out with getting this to heal. 06-13-2022 upon evaluation today patient appears to be doing well with regard to her wound its not any worse unfortunately it is also not significantly better which is the only issue currently. I do not see any signs of active infection locally or systemically which is great news. No fevers, chills, nausea, vomiting, or diarrhea. She does have her appointment on Monday with her neurosurgeon and we will see you Dr. Franky Macho has to say at that point. 06-18-2022 upon evaluation patient appears to be doing a little worse in regard to her wound only in the respect that has been several days since she has had this changed in fact it was last changed on Friday. Since that time she tells me that she has kept this in place over the weekend and yesterday she was supposed to see her neurosurgeon yesterday but they had to cancel due to an emergency surgery according to what the patient tells me. Nonetheless she is having some issues here with drainage coming from the wound that is more purulent in nature I think this is something we have been seeing in the [facility] for several weeks but is more obvious being that it was not changed as much during the last week. She unfortunately is a little worried about this but again I think that this is just part of what we have been dealing with all long is more prevalent and obvious due to the length of time since it was changed last. She does have a repeat appointment for I believe July 10 she told me although she is going to see if she can find anything sooner. 06-25-2022 upon evaluation today patient's wound actually  showed signs of marginal improvement which is good news. She still has the area  of abscess that seems to be at roughly 2:00 when looking at her back. We can get into this with the skinny probe other than that I really do not know what else I can do to try to make this better. We discussed this and she does have an appointment with her neurosurgeon although that is not till mid July as he had to change it at the last moment during the time she was post to see him a week ago. She does have evidence of potential infection on culture I am going to go ahead and treat this in order to ensure that nothing worsens. With that being said I do believe that overall she seems to be doing well but I do think we want to make sure that that the knee gets worse in the meantime until she gets to see her neurosurgeon. 07-01-2022 upon evaluation today patient appears to continue to have trouble with her wound draining. Again we have been continue to monitor this and I still see the same issue that I have noted before the patient has an area which is draining seemingly from around the 2:00 location when you go up towards 12:00 and then over towards 2:00 looking at her back this is where there is actually a soft area external that if you push on it you will see fluid come out and subsequently this is also where it tracks to internally. Everything in the 6:00 location has completely healed in and this looks much better in that regard but this upper portion we just cannot get to closed with the methods that we have. It seemed to me that this is probably going to require that this area be open and cleaned out in order to allow it to heal and my suggestion would be that a wound VAC is probably going to be the ideal thing. 07-09-2022 upon evaluation today patient appears to be doing somewhat poorly in regard to her wound. She did see Dr. Franky Macho. He stated that the wound was not being "packed appropriately" according to the patient and her daughter who were present during the evaluation today as well as the  evaluation with him yesterday. With that being said he packed the wound the way he said it needed to be and to be partly honest that is over packed. The patient is having a tremendous amount of discomfort and states that this is no way that she is good to be able to tolerate that. I understand the concern about the whole closing down which is why I recommended trying to keep this open is much as possible and again with that all being said this is still going to continue to be an issue as far as this closing down before everything heals up on the inside. Nonetheless she continues to have issues with significant drainage. She also has an area which almost feels like a small abscess or least of fluid pocket that feels up that was not appropriately packed with the dressings that were in place. With that being said I think that this is going to have to be opened at some point in time in order to get this to heal I am really not certain the best way to go about this. I discussed that with the patient again today. [name], [name] ([id number]) 07-16-2022 upon evaluation today patient appears to be doing well currently in regard to her  wound from the standpoint of the pain getting better which is great news. Fortunately I do not see any evidence of active infection locally or systemically which is great news. No fevers, chills, nausea, vomiting, or diarrhea. With that being said the patient has been tolerating the dressing changes better without complication which is great news. 07-29-2022 upon evaluation today patient appears to be doing actually pretty well in regard to her wound. Fortunately there does not appear to be any signs of infection. She still has drainage and we still are seeing an area from this pocket up at 12:00 that is leaking but fortunately this does not appear to be having any significant issues with infection right now which is great news. Fortunately her pain is also calm down since I saw  her 2 weeks ago. 08-05-2022 upon evaluation today patient appears to be doing unfortunately a little worse in regard to the discomfort she is feeling she tells me that this is bothering her pretty much all the time at a low level. It never seems to go away. This is since last week. With that being said the overall depth is unfortunately a little bit deeper this week which is definitely not what I was hoping to see. I did get to review the second opinion that was requested by the patient from her insurance to have an independent physician review the situation here. The short story of that reviewed which I did read through the entirety of she states that the patient does likely need surgery to correct the seroma considering length of time this has been going on. It was recommended that a plastic surgeon be the one to have this up in case there is a muscle flap necessary but at minimum that this area needs to be cleaned out and then appropriately close following. The patient has also read through this. She does seem to be a little bit off of her normal game not feeling quite that well today. She is not exactly sure what is going on. Objective Constitutional Obese and well-hydrated in no acute distress. Vitals Time Taken: 2:09 PM, Height: 58 in, Weight: 275 lbs, BMI: 57.5, Temperature: 97.5 F, Pulse: 84 bpm, Respiratory Rate: 18 breaths/min, Blood Pressure: 126/77 mmHg. Respiratory normal breathing without difficulty. Psychiatric this patient is able to make decisions and demonstrates good insight into disease process. Alert and Oriented x 3. pleasant and cooperative. General Notes: Upon inspection patient's wound bed showed signs of being a little bit deeper in regard to the 12:00 location today. This does seem to be draining still quite a bit as well. Fortunately I do not see any evidence of infection but I do think that she is continuing to have some issues here unfortunately this is getting to  the point that I think it is wearing on her a bit. Integumentary (Hair, Skin) Wound #1 status is Open. Original cause of wound was Surgical Injury. The date acquired was: 04/11/2021. The wound has been in treatment 42 weeks. The wound is located on the Distal,Midline Back. The wound measures 0.3cm length x 0.3cm width x 2.5cm depth; 0.071cm^2 area and 0.177cm^3 volume. There is Fat Layer (Subcutaneous Tissue) exposed. There is no tunneling noted, however, there is undermining starting at 9:00 and ending at 3:00 with a maximum distance of 3.7cm. There is a medium amount of serosanguineous drainage noted. There is large (67- 100%) red granulation within the wound bed. There is no necrotic tissue within the wound bed. 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 Nurse Visit as needed Home Health: Home Health Company: - [facility] for wound care. May utilize formulary equivalent dressing for wound treatment orders unless otherwise specified. Home Health Nurse may visit PRN to address patient s wound care needs. - Monday and Friday [facility] fax [phone] Bathing/ Shower/ Hygiene: May shower; gently cleanse wound with antibacterial soap, rinse and pat dry prior to dressing wounds No tub bath. Anesthetic (Use 'Patient Medications' Section for Anesthetic Order Entry): Lidocaine applied to wound bed Edema Control - Lymphedema / Segmental Compressive Device / Other: Elevate, Exercise Daily and Avoid Standing for Long Periods of Time. Elevate legs to the level of the heart and pump ankles as often as possible Elevate leg(s) parallel to the floor when sitting. Additional Orders /  Instructions: Follow Nutritious Diet and Increase Protein Intake WOUND #1: - Back Wound Laterality: Midline, Distal Cleanser: Normal Saline 1 x Per Day/30 Days Discharge Instructions: Wash your hands with soap and water. Remove old dressing, discard into plastic bag and place into trash. Cleanse the wound with Normal Saline prior to applying a clean dressing using gauze sponges, not tissues or cotton balls. Do not scrub or use excessive force. Pat dry using gauze sponges, not tissue or cotton balls. Primary Dressing: Hydrofera Blue Rope 1 x Per Day/30 Days Discharge Instructions: cut into fourths; angle the end Secondary Dressing: (SILICONE BORDER) Zetuvit Plus SILICONE BORDER Dressing 4x4 (in/in) 1 x Per Day/30 Days 1. Based on what I am seeing currently I do believe that it may be best for [place] to actually see about having her see a plastic surgeon she is going to check into what plastic surgery offices are in network with her insurance which I think is definitely a good way to go. Once we have that information we can make that referral for her that way they will have all the information about what is going on and we can find the best place for her to go for that opinion with regard to surgical closure attempt at this point. 2. Obviously with regard to the pain I am very sorry that this is bothering her to the degree that it is. I really wish we get to the point where it was not irritating as much as it was previous. She voiced understanding but at the same time notes that it is just getting to be something that is wearing on her which I completely understand. 3. We will see about making referral ASAP to plastic surgery once we can get the message back from the patient about what plastic surgeons are in network with her insurance. We will see patient back for reevaluation in 1 week here in the clinic. If anything worsens or changes patient will contact our office for  additional recommendations. Electronic Signature(s) Signed: 08/05/2022 2:30:41 PM By: [name] PA-C Entered By: [name] on 08/05/2022 14:30:41 Anzaldo, Domingo Pulse ([id number]) -------------------------------------------------------------------------------- SuperBill Details Patient Name: Mifflin, Lasheena C. Date of Service: 08/05/2022 Medical Record Number: [id number] Patient Account Number: [id number] Date of Birth/Sex: 1961-05-10 (61 y.o. 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 G35 Multiple sclerosis I10 Essential (primary) hypertension Facility Procedures CPT4 Code: [id number] Description: [phone] - WOUND CARE VISIT-LEV 2 EST PT Modifier: Quantity: 1 Physician Procedures CPT4 Code: [id number] Description: 99213 - WC PHYS LEVEL 3 - EST PT Modifier: Quantity: 1 CPT4 Code: Description: ICD-10 Diagnosis Description T81.31XA Disruption of external operation (surgical) wound, not elsewhere classifi 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

## 2022-08-05 NOTE — Progress Notes (Signed)
VYSHNAVI, CIFELLI ([id number]) Visit Report for 08/05/2022 Arrival Information Details Patient Name: [name], [name] Date of Service: 08/05/2022 2:00 PM Medical Record Number: [id number] Patient Account Number: [id number] Date of Birth/Sex: [birth year] (61 y.o. F) Treating RN: Carlene Coria Primary Care Danetra Glock: Card, Jenny Reichmann Other Clinician: Referring Charnita Trudel: Card, John Treating Ayrianna Mcginniss/Extender: Skipper Cliche in Treatment: 42 Visit Information History Since Last Visit All ordered tests and consults were completed: No Patient Arrived: [name] Added or deleted any medications: No Arrival Time: 14:09 Any new allergies or adverse reactions: No Accompanied By: self Had a fall or experienced change in No Transfer Assistance: None activities of daily living that may affect Patient Identification Verified: Yes risk of falls: Secondary Verification Process Completed: Yes Signs or symptoms of abuse/neglect since last visito No Patient Requires Transmission-Based Precautions: No Hospitalized since last visit: No Patient Has Alerts: No Implantable device outside of the clinic excluding No cellular tissue based products placed in the center since last visit: Has Dressing in Place as Prescribed: Yes Pain Present Now: No Electronic Signature(s) Unsigned Entered ByCarlene Coria on 08/05/2022 14:09:42 Signature(s): Date(s): Fickel, Monzerrath CMarland Kitchen ([id number]) -------------------------------------------------------------------------------- Clinic Level of Care Assessment Details Patient Name: [name], [name] Date of Service: 08/05/2022 2:00 PM Medical Record Number: [id number] Patient Account Number: [id number] Date of Birth/Sex: Jul 19, 1961 (61 y.o. F) Treating RN: Carlene Coria Primary Care Catherene Kaleta: Card, Jenny Reichmann Other Clinician: Referring Deliana Avalos: Card, John Treating Woodie Trusty/Extender: Skipper Cliche in Treatment: 42 Clinic Level of Care Assessment Items TOOL 4 Quantity Score X  - Use when only an EandM is performed on FOLLOW-UP visit 1 0 ASSESSMENTS - Nursing Assessment / Reassessment X - Reassessment of Co-morbidities (includes updates in patient status) 1 10 X- 1 5 Reassessment of Adherence to Treatment Plan ASSESSMENTS - Wound and Skin Assessment / Reassessment X - Simple Wound Assessment / Reassessment - one wound 1 5 []  - 0 Complex Wound Assessment / Reassessment - multiple wounds []  - 0 Dermatologic / Skin Assessment (not related to wound area) ASSESSMENTS - Focused Assessment []  - Circumferential Edema Measurements - multi extremities 0 []  - 0 Nutritional Assessment / Counseling / Intervention []  - 0 Lower Extremity Assessment (monofilament, tuning fork, pulses) []  - 0 Peripheral Arterial Disease Assessment (using hand held doppler) ASSESSMENTS - Ostomy and/or Continence Assessment and Care []  - Incontinence Assessment and Management 0 []  - 0 Ostomy Care Assessment and Management (repouching, etc.) PROCESS - Coordination of Care X - Simple Patient / Family Education for ongoing care 1 15 []  - 0 Complex (extensive) Patient / Family Education for ongoing care []  - 0 Staff obtains Programmer, systems, Records, Test Results / Process Orders []  - 0 Staff telephones HHA, Nursing Homes / Clarify orders / etc []  - 0 Routine Transfer to another Facility (non-emergent condition) []  - 0 Routine Hospital Admission (non-emergent condition) []  - 0 New Admissions / Biomedical engineer / Ordering NPWT, Apligraf, etc. []  - 0 Emergency Hospital Admission (emergent condition) X- 1 10 Simple Discharge Coordination []  - 0 Complex (extensive) Discharge Coordination PROCESS - Special Needs []  - Pediatric / Minor Patient Management 0 []  - 0 Isolation Patient Management []  - 0 Hearing / Language / Visual special needs []  - 0 Assessment of Community assistance (transportation, D/C planning, etc.) []  - 0 Additional assistance / Altered mentation []  - 0 Support  Surface(s) Assessment (bed, cushion, seat, etc.) INTERVENTIONS - Wound Cleansing / Measurement [name], [name] ([id number]) X- 1 5 Simple Wound Cleansing - one wound []  - 0  Complex Wound Cleansing - multiple wounds X- 1 5 Wound Imaging (photographs - any number of wounds) []  - 0 Wound Tracing (instead of photographs) X- 1 5 Simple Wound Measurement - one wound []  - 0 Complex Wound Measurement - multiple wounds INTERVENTIONS - Wound Dressings X - Small Wound Dressing one or multiple wounds 1 10 []  - 0 Medium Wound Dressing one or multiple wounds []  - 0 Large Wound Dressing one or multiple wounds []  - 0 Application of Medications - topical []  - 0 Application of Medications - injection INTERVENTIONS - Miscellaneous []  - External ear exam 0 []  - 0 Specimen Collection (cultures, biopsies, blood, body fluids, etc.) []  - 0 Specimen(s) / Culture(s) sent or taken to Lab for analysis []  - 0 Patient Transfer (multiple staff / / Similar devices) []  - 0 Simple Staple / Suture removal (25 or less) []  - 0 Complex Staple / Suture removal (26 or more) []  - 0 Hypo / Hyperglycemic Management (close monitor of Blood Glucose) []  - 0 Ankle / Brachial Index (ABI) - do not check if billed separately X- 1 5 Vital Signs Has the patient been seen at the hospital within the last three years: Yes Total Score: 75 Level Of Care: New/Established - Level 2 Electronic Signature(s) Unsigned Entered By on 08/05/2022 14:15:57 Signature(s): Date(s): Buttram, Kamera C ( ) -------------------------------------------------------------------------------- Encounter Discharge Information Details Patient Name: [name], [name] Date of Service: 08/05/2022 2:00 PM Medical Record Number: Patient Account Number: Date of Birth/Sex: 08/09/1961 (61 y.o. F) Treating RN: Nurse, adult Primary Care Dusten Ellinwood: Other Clinician: Referring  Camreigh Michie: Card, John Treating Danyeal Akens/Extender: in Treatment: 8 Encounter Discharge Information Items Discharge Condition: Stable Ambulatory Status: Walker Discharge Destination: Home Transportation: Private Auto Accompanied By: self Schedule Follow-up Appointment: Yes Clinical Summary of Care: Electronic Signature(s) Unsigned Entered By on 08/05/2022 14:17:20 Signature(s): Date(s): Hallberg, Saylee C. (10/05/2022) -------------------------------------------------------------------------------- Lower Extremity Assessment Details Patient Name: [name], [name] Date of Service: 08/05/2022 2:00 PM Medical Record Number: [id number] Patient Account Number: 10/05/2022 Date of Birth/Sex: 1961-08-12 (61 y.o. F) Treating RN: 09/11/1961 Primary Care Blimi Godby: 62 Other Clinician: Referring Ancil Dewan: Card, John Treating Nathan Stallworth/Extender: Yevonne Pax in Treatment: 10 Electronic Signature(s) Unsigned Entered By: Rowan Blase on 08/05/2022 14:10:54 Signature(s): Date(s): Leu, Calliope C. (Yevonne Pax) -------------------------------------------------------------------------------- Multi Wound Chart Details Patient Name: [name], [name] Date of Service: 08/05/2022 2:00 PM Medical Record Number: [id number] Patient Account Number: 10/05/2022 Date of Birth/Sex: 1961-03-23 (61 y.o. F) Treating RN: 09/11/1961 Primary Care Samentha Perham: 64 Other Clinician: Referring Morenike Cuff: Card, John Treating Ryott Rafferty/Extender: Yevonne Pax in Treatment: 42 Vital Signs Height(in): 58 Pulse(bpm): 84 Weight(lbs): 275 Blood Pressure(mmHg): 126/77 Body Mass Index(BMI): 57.5 Temperature(F): 97.5 Respiratory Rate(breaths/min): 18 Photos: [N/A:N/A] Wound Location: Distal, Midline Back N/A N/A Wounding Event: Surgical Injury N/A N/A Primary Etiology: Dehisced Wound N/A N/A Comorbid History: Hypertension N/A N/A Date Acquired: 04/11/2021 N/A  N/A Weeks of Treatment: 42 N/A N/A Wound Status: Open N/A N/A Wound Recurrence: No N/A N/A Measurements L x W x D (cm) 0.3x0.3x2.2 N/A N/A Area (cm) : 0.071 N/A N/A Volume (cm) : 0.156 N/A N/A % Reduction in Area: 43.70% N/A N/A % Reduction in Volume: 73.00% N/A N/A Classification: Full Thickness Without Exposed N/A N/A Support Structures Exudate Amount: Medium N/A N/A Exudate Type: Serosanguineous N/A N/A Exudate Color: red, brown N/A N/A Granulation Amount: Large (67-100%) N/A N/A Granulation Quality: Red N/A N/A Necrotic Amount: None Present (  0%) N/A N/A Exposed Structures: Fat Layer (Subcutaneous Tissue): N/A N/A Yes Fascia: No Tendon: No Muscle: No Joint: No Bone: No Epithelialization: None N/A N/A Treatment Notes Electronic Signature(s) Unsigned Entered ByCarlene Coria on 08/05/2022 14:11:13 Signature(s): Date(s): Mix, Calista Loletha [name] ([id number]) -------------------------------------------------------------------------------- Multi-Disciplinary Care Plan Details Patient Name: [name], [name] Date of Service: 08/05/2022 2:00 PM Medical Record Number: [id number] Patient Account Number: [id number] Date of Birth/Sex: [birth year] (61 y.o. F) Treating RN: Carlene Coria Primary Care Aqil Goetting: Clayborn Heron Other Clinician: Referring Offie Pickron: Card, John Treating Niylah Hassan/Extender: Skipper Cliche in Treatment: 39 Active Inactive Wound/Skin Impairment Nursing Diagnoses: Knowledge deficit related to ulceration/compromised skin integrity Goals: Patient/caregiver will verbalize understanding of skin care regimen Date Initiated: 10/15/2021 Target Resolution Date: 08/09/2022 Goal Status: Active Ulcer/skin breakdown will have a volume reduction of 30% by week 4 Date Initiated: 10/15/2021 Date Inactivated: 01/29/2022 Target Resolution Date: 12/15/2021 Goal Status: Unmet Unmet Reason: comorbities Ulcer/skin breakdown will have a volume reduction of 50% by week 8 Date  Initiated: 10/15/2021 Date Inactivated: 01/29/2022 Target Resolution Date: 01/15/2022 Goal Status: Unmet Unmet Reason: comorbities Ulcer/skin breakdown will have a volume reduction of 80% by week 12 Date Initiated: 10/15/2021 Date Inactivated: 02/19/2022 Target Resolution Date: 02/15/2022 Goal Status: Unmet Unmet Reason: comorbities Ulcer/skin breakdown will heal within 14 weeks Date Initiated: 10/15/2021 Date Inactivated: 05/07/2022 Target Resolution Date: 03/15/2022 Goal Status: Unmet Unmet Reason: comorbities Interventions: Assess patient/caregiver ability to obtain necessary supplies Assess patient/caregiver ability to perform ulcer/skin care regimen upon admission and as needed Assess ulceration(s) every visit Notes: Electronic Signature(s) Unsigned Entered By: Carlene Coria on 08/05/2022 14:11:07 Signature(s): Date(s): [name], [name] ([id number]) -------------------------------------------------------------------------------- Pain Assessment Details Patient Name: [name], [name] Date of Service: 08/05/2022 2:00 PM Medical Record Number: [id number] Patient Account Number: [id number] Date of Birth/Sex: [birth year] (61 y.o. F) Treating RN: Carlene Coria Primary Care Franny Selvage: Clayborn Heron Other Clinician: Referring Abcde Oneil: Card, John Treating Lummie Montijo/Extender: Skipper Cliche in Treatment: 42 Active Problems Location of Pain Severity and Description of Pain Patient Has Paino No Site Locations Pain Management and Medication Current Pain Management: Electronic Signature(s) Unsigned Entered ByCarlene Coria on 08/05/2022 14:10:18 Signature(s): Date(s): [name], [name] ([id number]) -------------------------------------------------------------------------------- Patient/Caregiver Education Details Patient Name: [name], [name] Date of Service: 08/05/2022 2:00 PM Medical Record Number: [id number] Patient Account Number: [id number] Date of Birth/Gender: 09/04/1961  (61 y.o. F) Treating RN: Carlene Coria Primary Care Physician: Clayborn Heron Other Clinician: Referring Physician: Card, John Treating Physician/Extender: Skipper Cliche in Treatment: 55 Education Assessment Education Provided To: Patient Education Topics Provided Wound/Skin Impairment: Methods: Explain/Verbal Responses: State content correctly Electronic Signature(s) Unsigned Entered By: Carlene Coria on 08/05/2022 14:16:42 Signature(s): Date(s): [name], [name] C. ([id number]) -------------------------------------------------------------------------------- Wound Assessment Details Patient Name: Rauf, Petra C. Date of Service: 08/05/2022 2:00 PM Medical Record Number: [id number] Patient Account Number: [id number] Date of Birth/Sex: [birth year] (61 y.o. F) Treating RN: Carlene Coria Primary Care Meghann Landing: Card, Jenny Reichmann Other Clinician: Referring Daivik Overley: Card, John Treating Ikhlas Albo/Extender: Skipper Cliche in Treatment: 42 Wound Status Wound Number: 1 Primary Etiology: Dehisced Wound Wound Location: Distal, Midline Back Wound Status: Open Wounding Event: Surgical Injury Comorbid History: Hypertension Date Acquired: 04/11/2021 Weeks Of Treatment: 42 Clustered Wound: No Photos Wound Measurements Length: (cm) 0.3 Width: (cm) 0.3 Depth: (cm) 2.5 Area: (cm) 0.071 Volume: (cm) 0.177 % Reduction in Area: 43.7% % Reduction in Volume: 69.4% Epithelialization: None Tunneling: No Undermining: Yes Starting Position (o'clock): 9 Ending Position (o'clock): 3 Maximum Distance: (cm) 3.7 Wound Description Classification: Full Thickness Without Exposed Support  Structu Exudate Amount: Medium Exudate Type: Serosanguineous Exudate Color: red, brown res Foul Odor After Cleansing: No Slough/Fibrino No Wound Bed Granulation Amount: Large (67-100%) Exposed Structure Granulation Quality: Red Fascia Exposed: No Necrotic Amount: None Present (0%) Fat Layer (Subcutaneous Tissue)  Exposed: Yes Tendon Exposed: No Muscle Exposed: No Joint Exposed: No Bone Exposed: No Electronic Signature(s) Signed: 08/05/2022 2:27:53 PM By: Yevonne Pax RN Entered By: Yevonne Pax on 08/05/2022 14:27:53 Wivell, Joda C. ([id number]) -------------------------------------------------------------------------------- Vitals Details Patient Name: Dawn, Shelsea C. Date of Service: 08/05/2022 2:00 PM Medical Record Number: [id number] Patient Account Number: [id number] Date of Birth/Sex: 1961-10-30 (61 y.o. F) Treating RN: Yevonne Pax Primary Care Kc Sedlak: [name] Other Clinician: Referring Sanyiah Kanzler: Card, John Treating Flemon Kelty/Extender: Rowan Blase in Treatment: 42 Vital Signs Time Taken: 14:09 Temperature (F): 97.5 Height (in): 58 Pulse (bpm): 84 Weight (lbs): 275 Respiratory Rate (breaths/min): 18 Body Mass Index (BMI): 57.5 Blood Pressure (mmHg): 126/77 Reference Range: 80 - 120 mg / dl Electronic Signature(s) Unsigned Entered ByYevonne Pax on 08/05/2022 14:10:08 Signature(s): Date(s):

## 2022-08-06 ENCOUNTER — Ambulatory Visit: Payer: 59 | Admitting: Physician Assistant

## 2022-08-07 ENCOUNTER — Encounter (INDEPENDENT_AMBULATORY_CARE_PROVIDER_SITE_OTHER): Payer: Self-pay

## 2022-08-08 ENCOUNTER — Ambulatory Visit: Payer: 59

## 2022-08-09 ENCOUNTER — Encounter: Payer: 59 | Admitting: Occupational Therapy

## 2022-08-12 ENCOUNTER — Encounter: Payer: 59 | Admitting: Physician Assistant

## 2022-08-12 DIAGNOSIS — L98422 Non-pressure chronic ulcer of back with fat layer exposed: Secondary | ICD-10-CM | POA: Diagnosis not present

## 2022-08-13 ENCOUNTER — Ambulatory Visit: Payer: 59 | Admitting: Physician Assistant

## 2022-08-14 NOTE — Progress Notes (Addendum)
[name], [name] ([id number]) Visit Report for 08/12/2022 Arrival Information Details Patient Name: [name], [name] Date of Service: 08/12/2022 2:00 PM Medical Record Number: [id number] Patient Account Number: [id number] Date of Birth/Sex: 17-Feb-1961 (61 y.o. F) Treating RN: [name] Primary Care [name]: [name], [name] Other Clinician: Referring [name]: [name], [name] Treating [name]/Extender: [name] in Treatment: 85 Visit Information History Since Last Visit All ordered tests and consults were completed: No Patient Arrived: [name] Added or deleted any medications: No Arrival Time: 14:15 Any new allergies or adverse reactions: No Accompanied By: self Had a fall or experienced change in No Transfer Assistance: None activities of daily living that may affect Patient Identification Verified: Yes risk of falls: Secondary Verification Process Completed: Yes Signs or symptoms of abuse/neglect since last visito No Patient Requires Transmission-Based Precautions: No Hospitalized since last visit: No Patient Has Alerts: No Implantable device outside of the clinic excluding No cellular tissue based products placed in the center since last visit: Has Dressing in Place as Prescribed: Yes Pain Present Now: No Electronic Signature(s) Signed: 08/13/2022 8:41:24 AM By: [name] Pax RN Entered By: [name] on 08/12/2022 14:21:07 Denherder, [name] ([id number]) -------------------------------------------------------------------------------- Clinic Level of Care Assessment Details Patient Name: [name], [name] Date of Service: 08/12/2022 2:00 PM Medical Record Number: [id number] Patient Account Number: [id number] Date of Birth/Sex: 05-07-1961 (61 y.o. F) Treating RN: [name] Primary Care Alauna Hayden: [name], [name] Other Clinician: Referring [name]: [name], [name] Treating [name]: [name] in Treatment: 65 Clinic Level of Care Assessment Items TOOL 4  Quantity Score X - Use when only an EandM is performed on FOLLOW-UP visit 1 0 ASSESSMENTS - Nursing Assessment / Reassessment X - Reassessment of Co-morbidities (includes updates in patient status) 1 10 X- 1 5 Reassessment of Adherence to Treatment Plan ASSESSMENTS - Wound and Skin Assessment / Reassessment X - Simple Wound Assessment / Reassessment - one wound 1 5 []  - 0 Complex Wound Assessment / Reassessment - multiple wounds []  - 0 Dermatologic / Skin Assessment (not related to wound area) ASSESSMENTS - Focused Assessment []  - Circumferential Edema Measurements - multi extremities 0 []  - 0 Nutritional Assessment / Counseling / Intervention []  - 0 Lower Extremity Assessment (monofilament, tuning fork, pulses) []  - 0 Peripheral Arterial Disease Assessment (using hand held doppler) ASSESSMENTS - Ostomy and/or Continence Assessment and Care []  - Incontinence Assessment and Management 0 []  - 0 Ostomy Care Assessment and Management (repouching, etc.) PROCESS - Coordination of Care X - Simple Patient / Family Education for ongoing care 1 15 []  - 0 Complex (extensive) Patient / Family Education for ongoing care []  - 0 Staff obtains , Records, Test Results / Process Orders []  - 0 Staff telephones HHA, Nursing Homes / Clarify orders / etc []  - 0 Routine Transfer to another Facility (non-emergent condition) []  - 0 Routine Hospital Admission (non-emergent condition) []  - 0 New Admissions / / Ordering NPWT, Apligraf, etc. []  - 0 Emergency Hospital Admission (emergent condition) X- 1 10 Simple Discharge Coordination []  - 0 Complex (extensive) Discharge Coordination PROCESS - Special Needs []  - Pediatric / Minor Patient Management 0 []  - 0 Isolation Patient Management []  - 0 Hearing / Language / Visual special needs []  - 0 Assessment of Community assistance (transportation, D/[name] planning, etc.) []  - 0 Additional assistance / Altered  mentation []  - 0 Support Surface(s) Assessment (bed, cushion, seat, etc.) INTERVENTIONS - Wound Cleansing / Measurement [name], [name] ( ) X- 1 5 Simple Wound Cleansing -  one wound []  - 0 Complex Wound Cleansing - multiple wounds X- 1 5 Wound Imaging (photographs - any number of wounds) []  - 0 Wound Tracing (instead of photographs) X- 1 5 Simple Wound Measurement - one wound []  - 0 Complex Wound Measurement - multiple wounds INTERVENTIONS - Wound Dressings X - Small Wound Dressing one or multiple wounds 1 10 []  - 0 Medium Wound Dressing one or multiple wounds []  - 0 Large Wound Dressing one or multiple wounds []  - 0 Application of Medications - topical []  - 0 Application of Medications - injection INTERVENTIONS - Miscellaneous []  - External ear exam 0 []  - 0 Specimen Collection (cultures, biopsies, blood, body fluids, etc.) []  - 0 Specimen(s) / Culture(s) sent or taken to Lab for analysis []  - 0 Patient Transfer (multiple staff / / Similar devices) []  - 0 Simple Staple / Suture removal (25 or less) []  - 0 Complex Staple / Suture removal (26 or more) []  - 0 Hypo / Hyperglycemic Management (close monitor of Blood Glucose) []  - 0 Ankle / Brachial Index (ABI) - do not check if billed separately X- 1 5 Vital Signs Has the patient been seen at the hospital within the last three years: Yes Total Score: 75 Level Of Care: New/Established - Level 2 Electronic Signature(s) Signed: 08/13/2022 8:41:24 AM By: RN Entered By: on 08/12/2022 14:33:35 [name], [name] ( ) -------------------------------------------------------------------------------- Encounter Discharge Information Details Patient Name: [name], [name] Date of Service: 08/12/2022 2:00 PM Medical Record Number: Patient Account Number: Date of Birth/Sex: 1961-10-16 (61 y.o. F) Treating RN: Primary Care Maleyah Waring:  Other Clinician: Referring Maryse Brierley: [name], [name] Treating Telly Broberg/Extender: in Treatment: 54 Encounter Discharge Information Items Discharge Condition: Stable Ambulatory Status: Walker Discharge Destination: Home Transportation: Private Auto Accompanied By: self Schedule Follow-up Appointment: Yes Clinical Summary of Care: Electronic Signature(s) Signed: 08/13/2022 8:41:24 AM By: [name] Pax RN Entered By: [name] on 08/12/2022 14:34:25 [name], [name] (Marland Kitchen) -------------------------------------------------------------------------------- Lower Extremity Assessment Details Patient Name: [name], [name] Date of Service: 08/12/2022 2:00 PM Medical Record Number: 08/14/2022 Patient Account Number: [id number] Date of Birth/Sex: 1961/12/28 (61 y.o. F) Treating RN: 64 Primary Care Keirstyn Aydt: [name] Other Clinician: Referring Alister Staver: [name], [name] Treating Kevante Lunt/Extender: [name] in Treatment: 74 Electronic Signature(s) Signed: 08/13/2022 8:41:24 AM By: 08/15/2022 RN Entered By: [name] on 08/12/2022 14:24:32 Devries, Arfa C08/16/2023 ([id number]) -------------------------------------------------------------------------------- Multi Wound Chart Details Patient Name: [name], [name] Date of Service: 08/12/2022 2:00 PM Medical Record Number: [id number] Patient Account Number: [id number] Date of Birth/Sex: August 13, 1961 (61 y.o. F) Treating RN: [name] Primary Care Jasmen Emrich: [name] Other Clinician: Referring Taevion Sikora: [name], [name] Treating Kirstan Fentress/Extender: [name] in Treatment: 28 Vital Signs Height(in): 58 Pulse(bpm): 75 Weight(lbs): 275 Blood Pressure(mmHg): 128/78 Body Mass Index(BMI): 57.5 Temperature(F): 97.8 Respiratory Rate(breaths/min): 18 Photos: [N/A:N/A] Wound Location: Distal, Midline Back N/A N/A Wounding Event: Surgical Injury N/A N/A Primary Etiology: Dehisced Wound N/A  N/A Comorbid History: Hypertension N/A N/A Date Acquired: 04/11/2021 N/A N/A Weeks of Treatment: 54 N/A N/A Wound Status: Open N/A N/A Wound Recurrence: No N/A N/A Measurements L x W x D (cm) 0.3x0.3x2 N/A N/A Area (cm) : 0.071 N/A N/A Volume (cm) : 0.141 N/A N/A % Reduction in Area: 43.70% N/A N/A % Reduction in Volume: 75.60% N/A N/A Classification: Full Thickness Without Exposed N/A N/A Support Structures Exudate Amount: Medium N/A N/A Exudate Type: Serosanguineous N/A N/A Exudate  Color: red, brown N/A N/A Granulation Amount: Large (67-100%) N/A N/A Granulation Quality: Red N/A N/A Necrotic Amount: None Present (0%) N/A N/A Exposed Structures: Fat Layer (Subcutaneous Tissue): N/A N/A Yes Fascia: No Tendon: No Muscle: No Joint: No Bone: No Epithelialization: None N/A N/A Treatment Notes Electronic Signature(s) Signed: 08/13/2022 8:41:24 AM By: [name] Pax RN Entered By: [name] on 08/12/2022 14:24:48 Wahlberg, Camree Salena Saner ([id number]) -------------------------------------------------------------------------------- Multi-Disciplinary Care Plan Details Patient Name: [name], [name] Date of Service: 08/12/2022 2:00 PM Medical Record Number: [id number] Patient Account Number: [id number] Date of Birth/Sex: 1961/05/31 (61 y.o. F) Treating RN: [name] Primary Care Marnisha Stampley: [name] Other Clinician: Referring [place]en [place]: [name], [name] Treating Emilija Bohman/Extender: [name] in Treatment: 62 Active Inactive Wound/Skin Impairment Nursing Diagnoses: Knowledge deficit related to ulceration/compromised skin integrity Goals: Patient/caregiver will verbalize understanding of skin care regimen Date Initiated: 10/15/2021 Target Resolution Date: 08/09/2022 Goal Status: Active Ulcer/skin breakdown will have a volume reduction of 30% by week 4 Date Initiated: 10/15/2021 Date Inactivated: 01/29/2022 Target Resolution Date: 12/15/2021 Goal Status: Unmet Unmet Reason:  comorbities Ulcer/skin breakdown will have a volume reduction of 50% by week 8 Date Initiated: 10/15/2021 Date Inactivated: 01/29/2022 Target Resolution Date: 01/15/2022 Goal Status: Unmet Unmet Reason: comorbities Ulcer/skin breakdown will have a volume reduction of 80% by week 12 Date Initiated: 10/15/2021 Date Inactivated: 02/19/2022 Target Resolution Date: 02/15/2022 Goal Status: Unmet Unmet Reason: comorbities Ulcer/skin breakdown will heal within 14 weeks Date Initiated: 10/15/2021 Date Inactivated: 05/07/2022 Target Resolution Date: 03/15/2022 Goal Status: Unmet Unmet Reason: comorbities Interventions: Assess patient/caregiver ability to obtain necessary supplies Assess patient/caregiver ability to perform ulcer/skin care regimen upon admission and as needed Assess ulceration(s) every visit Notes: Electronic Signature(s) Signed: 08/13/2022 8:41:24 AM By: [name] Pax RN Entered By: [name] on 08/12/2022 14:24:36 [name], [name] ([id number]) -------------------------------------------------------------------------------- Pain Assessment Details Patient Name: [name], [name] Date of Service: 08/12/2022 2:00 PM Medical Record Number: [id number] Patient Account Number: [id number] Date of Birth/Sex: 1961-12-23 (61 y.o. F) Treating RN: [name] Primary Care Amila Callies: [name] Other Clinician: Referring [name] Brunet: [name], [name] Treating Keena Heesch/Extender: [name] in Treatment: 69 Active Problems Location of Pain Severity and Description of Pain Patient Has Paino No Site Locations Pain Management and Medication Current Pain Management: Electronic Signature(s) Signed: 08/13/2022 8:41:24 AM By: [name] Pax RN Entered By: [name] on 08/12/2022 14:23:18 Kratz, Ghislaine Salena Saner ([id number]) -------------------------------------------------------------------------------- Patient/Caregiver Education Details Patient Name: Couse, Athena [name]. Date of Service:  08/12/2022 2:00 PM Medical Record Number: [id number] Patient Account Number: [id number] Date of Birth/Gender: [birth year] (61 y.o. F) Treating RN: [name] Primary Care Physician: [name] Other Clinician: Referring Physician: Card, [name] Treating Physician/Extender: [name] in Treatment: 67 Education Assessment Education Provided To: Patient Education Topics Provided Wound/Skin Impairment: Methods: Explain/Verbal Responses: State content correctly Electronic Signature(s) Signed: 08/13/2022 8:41:24 AM By: [name] Pax RN Entered By: [name] on 08/12/2022 14:33:51 [name], [name] ([id number]) -------------------------------------------------------------------------------- Wound Assessment Details Patient Name: [name], [name] Date of Service: 08/12/2022 2:00 PM Medical Record Number: [id number] Patient Account Number: [id number] Date of Birth/Sex: 1961-07-20 (61 y.o. F) Treating RN: [name] Primary Care Hamlin Devine: [name] Other Clinician: Referring Sitlaly Gudiel: [name], [name] Treating Dandra Velardi/Extender: [name] in Treatment: 66 Wound Status Wound Number: 1 Primary Etiology: Dehisced Wound Wound Location: Distal, Midline Back Wound Status: Open Wounding Event: Surgical Injury Comorbid History: Hypertension Date Acquired: 04/11/2021 Weeks Of Treatment: 43 Clustered Wound: No Photos Wound Measurements Length: (cm) 0.3 Width: (cm) 0.3 Depth: (cm) 2.5 Area: (cm)  0.071 Volume: (cm) 0.177 % Reduction in Area: 43.7% % Reduction in Volume: 69.4% Epithelialization: None Tunneling: No Undermining: Yes Starting Position (o'clock): 9 Ending Position (o'clock): 3 Maximum Distance: (cm) 3.2 Wound Description Classification: Full Thickness Without Exposed Support Structu Exudate Amount: Medium Exudate Type: Serosanguineous Exudate Color: red, brown res Foul Odor After Cleansing: No Slough/Fibrino No Wound Bed Granulation Amount: Large  (67-100%) Exposed Structure Granulation Quality: Red Fascia Exposed: No Necrotic Amount: None Present (0%) Fat Layer (Subcutaneous Tissue) Exposed: Yes Tendon Exposed: No Muscle Exposed: No Joint Exposed: No Bone Exposed: No Treatment Notes Wound #1 (Back) Wound Laterality: Midline, Distal Cleanser Normal Saline [name], [name] ([id number]) Discharge Instruction: Wash your hands with soap and water. Remove old dressing, discard into plastic bag and place into trash. Cleanse the wound with Normal Saline prior to applying a clean dressing using gauze sponges, not tissues or cotton balls. Do not scrub or use excessive force. Pat dry using gauze sponges, not tissue or cotton balls. Peri-Wound Care Topical Primary Dressing Hydrofera Blue Rope Discharge Instruction: cut into fourths; angle the end Secondary Dressing (BORDER) Zetuvit Plus SILICONE BORDER Dressing 4x4 (in/in) Secured With Compression Wrap Compression Stockings Add-Ons Electronic Signature(s) Signed: 09/16/2022 2:29:36 PM By: [name] Pax RN Previous Signature: 08/13/2022 8:41:24 AM Version By: [name] Pax RN Entered By: [name] on 09/16/2022 14:29:36 Humber, Nella [name]. ([id number]) -------------------------------------------------------------------------------- Vitals Details Patient Name: Dolin, Chaquetta [name]. Date of Service: 08/12/2022 2:00 PM Medical Record Number: [id number] Patient Account Number: [id number] Date of Birth/Sex: 29-May-1961 (61 y.o. F) Treating RN: [name] Primary Care Khyree Carillo: [name] Other Clinician: Referring Davonna Ertl: [name], [name] Treating Ailin Rochford/Extender: [name] in Treatment: 40 Vital Signs Time Taken: 14:21 Temperature (F): 97.8 Height (in): 58 Pulse (bpm): 75 Weight (lbs): 275 Respiratory Rate (breaths/min): 18 Body Mass Index (BMI): 57.5 Blood Pressure (mmHg): 128/78 Reference Range: 80 - 120 mg / dl Electronic Signature(s) Signed: 08/13/2022 8:41:24 AM  By: [name] Pax RN Entered By: [name] on 08/12/2022 14:21:49

## 2022-08-14 NOTE — Progress Notes (Addendum)
TEKELA, GARGUILO ([id number]) Visit Report for 08/12/2022 Chief Complaint Document Details Patient Name: [name], [name] Date of Service: 08/12/2022 2:00 PM Medical Record Number: [id number] Patient Account Number: [id number] Date of Birth/Sex: [birth year] (61 y.o. F) Treating RN: Yevonne Pax Primary Care Provider: [name] Other Clinician: Referring Provider: Card, John Treating Provider/Extender: Rowan Blase in Treatment: 47 Information Obtained from: Patient Chief Complaint Surgical Back Ulcer Electronic Signature(s) Signed: 08/12/2022 1:30:55 PM By: [name] PA-C Entered By: [name] on 08/12/2022 13:30:55 [name], [name] ([id number]) -------------------------------------------------------------------------------- HPI Details Patient Name: [name], [name] Date of Service: 08/12/2022 2:00 PM Medical Record Number: [id number] Patient Account Number: [id number] Date of Birth/Sex: 1961-11-04 (61 y.o. 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 This was actually broken up into 3 separate surgical events. The initial surgical intervention actually was on November 05, 2021 almost a year ago. Subsequently the patient went back in February for a seroma of the area which unfortunately required her to have a repeat surgery to go in and clean this out. And then again this occurred in April where she went back in and again they felt like stitches were coming out and there was an additional seroma. She was placed in a wound VAC initially and then subsequently as it got smaller that  was discontinued. Again right now I will see anything that I think a wound VAC would help with. Nonetheless she definitely has a significant depth to the wound that is going require packing. I actually believe the Hydrofera Blue rope would probably do quite well with this the problem is as much as it is draining she probably needs this to be changed at least every day. She does not really have anyone that can help with that that is the complicating scenario here. With that being said the patient does have a history of multiple sclerosis, hypertension, and again this surgical wound dehiscence in regard to her lumbar spine region. She did have a repeat MRI which was actually completed 10/09/2021. This showed that there was no significant change in the subcutaneous fluid collection/track of the lower lumbar region. This is extending to the level of the fascia unfortunately. This seems to go all the way from the L2 level with a track extending all the way to the fascia at the L4-5 level. Again this is a significant wound and there is significant drainage but does not seem to communicate to the spinal region as far as spinal fluid or otherwise is concerned that is good news. Nonetheless she last saw Dr. Franky Macho who is her neurosurgeon on 10/01/2021 that was when he ordered this last MRI she supposed to see him next week as well. With that being said he did not feel like there was any significant issue there but was not sure why this was not healing that is when he ordered the MRI. They were wanting to make sure that this was packed appropriately by home health unfortunately the main issue currently is that home health is completely out of the picture as the patient has exhausted all the home health that that she gets for a year. She is now in a very difficult  predicament where she does not have anyone to help her change the dressing and to be honest that she is not able to do it herself with the location of  the wound being on the midline lumbar spine region. If she does not have anyone that can help it is probably can to be necessary for her to go to a facility for rehab and daily dressing changes as I feel like daily changes which is much drainage that she is having is going to be necessary. 10/23/2021 upon evaluation today patient appears to be doing decently well in regard to her back ulcer. This does seem to be draining a lot less than what it is been doing in the past. With that being said she still has quite a bit of drainage nonetheless. I do think that given time this should improve least I hope so. The good news is she does have home health coming out 3 days a week were doing it 2 days a week and she is paying someone we can to help. 10/30/2021 upon evaluation today patient appears to be doing okay in regard to her back ulcer this is not draining quite as bad as it was in the beginning but he still has quite a bit of drainage noted. I do believe that the patient would benefit from [place] going ahead forward with attempting a wound VAC using the Hydrofera Blue rope to pack with and then subsequently using the VAC externally to actually suction out and help this to fill- in. I think this is our ideal way to try to get things cleared at this point. As it stands I am not certain that we are really making a progress that we want to see near with doing it in the way we are which is packing with the rope. It is a good dressing but I do think it is insufficient for total healing. She just seems to have too much in the way of drainage at this point unfortunately. 11/06/2021 upon evaluation today patient unfortunately continues to have issues with her back ongoing. The good news is her MRI that was repeated showed signs of the size of this area in the lumbar spine region having decreased from 4 cm to 3.5 cm this is definitely not bad news at all. With that being said unfortunately she continues to have issues  with ongoing drainage not as severe as in the beginning but nonetheless still significant. I do think a wound VAC still would be a good way to go although her home health agency nurse apparently has some concerns about the possibility of not being able to keep a seal with this as they had struggles in the past. Nonetheless I explained to the patient that this is much different than what she had previous and that I really feel like it would do much better as far as getting the area taken care of without having any complications or issues here. I think that we should be able to maintain a seal. Nonetheless at this time I did discuss with the patient as well that she probably does need to have a wound VAC in order for [place] to get this moving in the right direction. 11/13/2021 upon evaluation patient's wound bed actually showed signs of significant drainage at this time. She did see the surgeon yesterday he did not see anything that appeared to be infected. Nonetheless he does appear that she is continuing to have areas here that just do not seem  to want to seal up there MRI findings have been negative but nonetheless she continues to have is the seroma that is filling in. I do feel like we need to try to widen the hole so we can get at least a half of the [facility] then this will be better than nothing at this point. 11/20/2021 upon evaluation today patient actually appears to be having less pain at this point which is good news and overall she we still do not have the results of the culture back yet it had to be sent out to [facility] and we do not have the result back yet. Is doing decently well in regard to her wound. Fortunately there does not appear to be any signs of active infection systemically nor locally at this time. 11/27/2021 upon evaluation today patient appears to be doing well with regard to her wound all things considered there does appear to be less drainage than there was previous.  Fortunately I do not see any evidence of worsening of the patient is stating that she is having some issues with back pain. This is somewhat new. Again this I think could be related to the fact that she is having some issues here with infection. We are still waiting to see what the result of her culture shows from susceptibility testing Enterococcus has been identified but we do not know if this is VRE or not. 12/04/2021 upon evaluation today patient appears to be doing well with regard to her wound all things considered. I did have a conversation with Erin from Dr. Sueanne Margarita office. Of note she notes that Dr. Drinda Butts really does not want to do anything surgical right now which I completely understand. With that being said I am still leery of how far we will make it getting this to heal short of any type of surgery to open this up and allow [place] to more appropriately packed the wound. Nonetheless I will absolutely give it our best shot as far as that is concerned. I discussed that with the patient today. She voiced understanding. The good news is the drainage today seems to be clear it is no longer cloudy as it was previous I am actually very pleased in that regard. [name], [name] ([id number]) 12/11/2021 since have last seen the patient actually did have an conversation with Dr. Franky Macho with her neurosurgeon. Again this involve the discussion around whether or not to open the wound and try to apply a wound VAC following. With that being said the decision was made that that may be the best thing to do if the patient was in agreement. Nonetheless I am actually extremely encouraged with what I am seeing today much more than I would have thought. In fact I think that we may be over packing the wound which is why she is having some discomfort at this point as there is much less of the dressing able to get and this time compared to what we saw previous. That is actually really good news. In fact the 6:00  tunnel appears to have filled in is awesome news. At the 12:00 location I am actually able to pack into that area pretty effectively at this point today. In fact I was able to get less of the packing in which I will detail below. 12/18/2021 upon evaluation today patient appears to be doing well with regard to her wound on the back. Fortunately there is no signs of active infection which is great news and overall  very pleased with where things stand today. No fevers, chills, nausea, vomiting, or diarrhea. 01/01/2022 upon evaluation today patient appears to be doing decently well in regard to her wound. Fortunately there does not appear to be any signs of anything worsening which is great news. She still continues to have issues with drainage but this is not nearly as significant as what it was in the past. There is all clear drainage no signs of purulence noted. 01/08/2022 upon evaluation today patient appears to be doing well with regard to her wound. With that being said she tells me that she has been having some increased pain over the past week. It does appear based on the amount of Hydrofera Blue that we removed this is probably over pack. Again it is difficult to know exactly how old the nurses getting all the skin but nonetheless the length of Hydrofera Blue that was utilized was way too much which may be part of the reason why this felt so uncomfortable. Fortunately I think that we can cut back on that we discussed pieces smaller so hopefully that will not be over packed. 01/22/2021 upon evaluation today patient appears to be doing well With regard to there being no signs of infection at this time. The wound does still tunnel mainly up at the 12:00 location. The depth of the tunnel complete from entry point to the base is about 3.5 cm today. With that being said I do believe that overall she is making good progress this is just a very slow process for her which I know has been frustrating as  well. 01/29/2022 upon evaluation today patient appears to be doing well with regard to the wound on her lower back and the lumbar spine region. The good news is the depth that I got this week was a little bit less going up at the 12:00 location as compared to last week. I measured this to be 3.5 cm last week and it was 3.3 cm this week. Again that is a minute shift but nonetheless a good shift in the right direction. Overall I think that we are on the right track. 02/05/2022 upon evaluation today patient appears to be doing well with regard to her wound. In fact right now her measurements are somewhere around 2.9 cm which is definitely smaller and less deep than last week At the 12:00 location. Overall I am very pleased with what we are seeing. 02/19/2022 upon evaluation today patient unfortunately is noting that the wound is actually closing up externally but internally there is still some depth to this. I discussed with her today that we cannot let it close up like this is can end up being a significant issue for her if we allow for that. Subsequently we need to do what we can to try to open this up and keep it open more effectively. She voiced understanding. With that being said we are going to proceed with that procedure today in order to debride away some of the skin on externally that is trying to close and on this. 02/26/2022 upon evaluation patient appears to be doing better in my opinion after we had to open up this area last week. Again she has a significant amount of healing and overall I am extremely pleased with where things stand currently. I do not see any evidence of active infection locally nor systemically at this time which is great news and in general I think that we are on the right track for getting  this hopefully closed final and done. 03/05/2022 upon evaluation today patient unfortunately is doing a little bit worse with regard to the whole size this is starting to get much  smaller unfortunately. There does not appear to be any signs of active infection locally nor systemically which is great news. With that being said I am concerned about the fact that the patient is doing quite a bit worse with regard to trapping some fluid and almost feels like she may have a fluid pocket that not only goes in and up towards 12:00 but looks back around towards the more superficial subcutaneous tissue. I am very concerned that this is going to be something that is very hard for [place] to heal short of another surgery to open this up and try to wound VAC from the inside out. Even then there is no guarantees this is just a very difficult situation to be perfectly honest. 03/14/2022 upon evaluation today patient appears to be doing well with regard to her wound as far as the area staying open and pleased in that regard. With that being said unfortunately she is not doing as well when it comes to the irritation around it appears to be very inflamed and red this is not good that was discussed with her today as well. Subsequently I do believe that working to need to do what we can do to try and calm this down in the past she did well with the Augmentin due to Enterococcus infection I am not sure if were dealing with the same thing or not but at the same time I think that is probably to be my go to at this point. 03/19/2022 upon evaluation today patient appears to be doing really about the same in regard to her wound that the pain is better. 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 She seems to be improved dramatically with the antibiotic currently although it does not cover for MRSA I am apt to just see how this progresses over the next several days to a week. With that being said the bigger issue here is that we simply do not seem to be making excellent progress and I am concerned about the lack of progress. I discussed with Dr. Shon Baton with [facility] in [place]  and to be honest his recommendation was that the best bet would probably be to get her to a teaching center. She is actually been seen for rheumatology and a I believe psychologist/psychiatrist at [facility]. She would prefer to go that direction as opposed to [facility] or [facility]. I am definitely okay with that. 03/26/2022 upon evaluation today patient appears to be doing okay in regard to her wound. She is not showing signs of worsening and overall she tells me that her pain is not nearly as bad as it was previous. Fortunately I do not see any evidence of active infection locally or systemically which is great news. No fevers, chills, nausea, vomiting, or diarrhea. 04-02-2022 upon evaluation today patient appears to be doing well with regard to her wound. It does not appear to be showing any signs of worsening which is great news. With that being said you are still having some issues here with drainage although it is clear and mainly just tenderness with bleeding from the [facility] I do think that treating for the second issue which is MRSA is probably the right thing to do at this point she is now off of the Augmentin. 04-09-2022 upon  evaluation today patient's wound actually appears to be doing about the same. Fortunately I do not see any evidence of active infection locally or systemically at this time which is great news. No fevers, chills, nausea, vomiting, or diarrhea. 04-16-2022 upon evaluation today patient appears to be doing well with regard to her wound in the lumbar spine region. Subsequently she continues to have an area that does have a tunnel up into the 12:00 location. This is about 3.5 cm using a skinny probe curved upwards to get into this region. With that being said I really do not see any signs of overall improvement but also do not see any signs of worsening I feel like work on about a still made here to some degree. The patient did see her neurologist and he has referred her to  neurosurgery at [facility] for second [name], [name] ([id number]) opinion we will see what they have to say as well. 04-23-2022 upon evaluation today patient appears to be doing well with regard to her wound all things considered. She has been tolerating the dressing changes without complication. Fortunately I do not see any signs of active infection locally nor systemically which is great news. No fever chills noted 04-30-2022 upon evaluation today patient appears to be doing about the same in regard to her wound. The depth is really not dramatically improved as far as the 12:00 tunnel is concerned. The overall depth and the base of the wound does seem to be a little bit better it does sound like that external wound has closed as far as the opening is concerned we can have to cut this down from a half to a smaller size based on what we are seeing today. 05-07-2022 upon evaluation today patient's wound actually seems to be doing decently well. There is not a major shift here but a slight shift in the depth both straight and as well as in the [place] at 12:00. With that being said I again we will talk about a couple of millimeters but still every little bit can count when there are situations like this to be honest. 05-14-2022 upon evaluation today patient appears to be doing okay in regard to her wound. I do not see any signs of anything worsening. With that being said also do not see getting significantly better unfortunately. There does not appear to be any evidence of infection locally or systemically which is great news. No fevers, chills, nausea, vomiting, or diarrhea. 05-21-2022 upon evaluation today patient actually appears to be doing quite well in regard to her wound from the standpoint of there being no infection. With that being said there does not appear to be any evidence of infection locally nor systemically which is great news. No fevers, chills, nausea, vomiting, or diarrhea. 05-28-2022  upon evaluation today patient's wound actually appears to be doing about the same. I do not see any evidence of active infection locally or systemically which is great news. No fevers, chills, nausea, vomiting, or diarrhea. 06-04-2022 upon evaluation today patient appears to be doing well with regard to her wound. She has been tolerating dressing changes without complication. Fortunately I do not feel like there is any worsening also I do not feel like there is any improvement. I feel like right a solid area here where she has a tunnel that tracks up at 12:00 and then has a fluid pocket at around roughly 1:00 I can push on this and squeeze fluid out. Again I am not exactly sure  what else can be done from my standpoint to improve this. She does have an appointment with Dr. Christella Noa who I do believe is an excellent surgeon and this is the surgeon who performed this surgery initially. Again the biggest issue was following she had a lot of trouble with the wound VAC I do think that we need to try to see if she is can have a wound VAC following surgery what exactly regular need to do to help keep this moving in the right direction for her. Obviously I want her to have the best result possible that she has to go through surgery again to clean this area out. Absolutely willing to help out with getting this to heal. 06-13-2022 upon evaluation today patient appears to be doing well with regard to her wound its not any worse unfortunately it is also not significantly better which is the only issue currently. I do not see any signs of active infection locally or systemically which is great news. No fevers, chills, nausea, vomiting, or diarrhea. She does have her appointment on Monday with her neurosurgeon and we will see you Dr. Christella Noa has to say at that point. 06-18-2022 upon evaluation patient appears to be doing a little worse in regard to her wound only in the respect that has been several days since she has had  this changed in fact it was last changed on Friday. Since that time she tells me that she has kept this in place over the weekend and yesterday she was supposed to see her neurosurgeon yesterday but they had to cancel due to an emergency surgery according to what the patient tells me. Nonetheless she is having some issues here with drainage coming from the wound that is more purulent in nature I think this is something we have been seeing in the [facility] for several weeks but is more obvious being that it was not changed as much during the last week. She unfortunately is a little worried about this but again I think that this is just part of what we have been dealing with all long is more prevalent and obvious due to the length of time since it was changed last. She does have a repeat appointment for I believe July 10 she told me although she is going to see if she can find anything sooner. 06-25-2022 upon evaluation today patient's wound actually showed signs of marginal improvement which is good news. She still has the area of abscess that seems to be at roughly 2:00 when looking at her back. We can get into this with the skinny probe other than that I really do not know what else I can do to try to make this better. We discussed this and she does have an appointment with her neurosurgeon although that is not till mid July as he had to change it at the last moment during the time she was post to see him a week ago. She does have evidence of potential infection on culture I am going to go ahead and treat this in order to ensure that nothing worsens. With that being said I do believe that overall she seems to be doing well but I do think we want to make sure that that the knee gets worse in the meantime until she gets to see her neurosurgeon. 07-01-2022 upon evaluation today patient appears to continue to have trouble with her wound draining. Again we have been continue to monitor this and I still  see  the same issue that I have noted before the patient has an area which is draining seemingly from around the 2:00 location when you go up towards 12:00 and then over towards 2:00 looking at her back this is where there is actually a soft area external that if you push on it you will see fluid come out and subsequently this is also where it tracks to internally. Everything in the 6:00 location has completely healed in and this looks much better in that regard but this upper portion we just cannot get to closed with the methods that we have. It seemed to me that this is probably going to require that this area be open and cleaned out in order to allow it to heal and my suggestion would be that a wound VAC is probably going to be the ideal thing. 07-09-2022 upon evaluation today patient appears to be doing somewhat poorly in regard to her wound. She did see Dr. Franky Macho. He stated that the wound was not being "packed appropriately" according to the patient and her daughter who were present during the evaluation today as well as the evaluation with him yesterday. With that being said he packed the wound the way he said it needed to be and to be partly honest that is over packed. The patient is having a tremendous amount of discomfort and states that this is no way that she is good to be able to tolerate that. I understand the concern about the whole closing down which is why I recommended trying to keep this open is much as possible and again with that all being said this is still going to continue to be an issue as far as this closing down before everything heals up on the inside. Nonetheless she continues to have issues with significant drainage. She also has an area which almost feels like a small abscess or least of fluid pocket that feels up that was not appropriately packed with the dressings that were in place. With that being said I think that this is going to have to be opened at some point in  time in order to get this to heal I am really not certain the best way to go about this. I discussed that with the patient again today. 07-16-2022 upon evaluation today patient appears to be doing well currently in regard to her wound from the standpoint of the pain getting better which is great news. Fortunately I do not see any evidence of active infection locally or systemically which is great news. No fevers, chills, nausea, vomiting, or diarrhea. With that being said the patient has been tolerating the dressing changes better without complication which is great news. [name], [name] ([id number]) 07-29-2022 upon evaluation today patient appears to be doing actually pretty well in regard to her wound. Fortunately there does not appear to be any signs of infection. She still has drainage and we still are seeing an area from this pocket up at 12:00 that is leaking but fortunately this does not appear to be having any significant issues with infection right now which is great news. Fortunately her pain is also calm down since I saw her 2 weeks ago. 08-05-2022 upon evaluation today patient appears to be doing unfortunately a little worse in regard to the discomfort she is feeling she tells me that this is bothering her pretty much all the time at a low level. It never seems to go away. This is since last week. With that being  said the overall depth is unfortunately a little bit deeper this week which is definitely not what I was hoping to see. I did get to review the second opinion that was requested by the patient from her insurance to have an independent physician review the situation here. The short story of that reviewed which I did read through the entirety of she states that the patient does likely need surgery to correct the seroma considering length of time this has been going on. It was recommended that a plastic surgeon be the one to have this up in case there is a muscle flap necessary but at  minimum that this area needs to be cleaned out and then appropriately close following. The patient has also read through this. She does seem to be a little bit off of her normal game not feeling quite that well today. She is not exactly sure what is going on. 08-12-2022 upon evaluation today patient's wound is actually showing signs of maintaining stability which is good news. Fortunately there does not appear to be any evidence of active infection locally or systemically at this time which is excellent. With that being said she does have a meeting with her nurse with [facility] [facility] on Thursday to discuss options for plastic surgery where she is able to go. Electronic Signature(s) Signed: 08/12/2022 2:43:42 PM By: [name] PA-C Entered By: [name] on 08/12/2022 14:43:42 [name], [name] ([id number]) -------------------------------------------------------------------------------- Physical Exam Details Patient Name: Bick, Shalen C. Date of Service: 08/12/2022 2:00 PM Medical Record Number: [id number] Patient Account Number: [id number] Date of Birth/Sex: [birth year] (61 y.o. F) Treating RN: Yevonne Pax Primary Care Provider: [name] Other Clinician: Referring Provider: Card, John Treating Provider/Extender: Rowan Blase in Treatment: 24 Constitutional Well-nourished and well-hydrated in no acute distress. Respiratory normal breathing without difficulty. Psychiatric this patient is able to make decisions and demonstrates good insight into disease process. Alert and Oriented x 3. pleasant and cooperative. Notes Upon inspection patient's wound actually does not appear to be any deeper than what it was previous is also not tunneling up at 12:00 and a deeper than what it was previous. Again this is not a huge open area but still we seem to be at a stalemate where there is not a whole lot of improvement overall. Electronic Signature(s) Signed: 08/12/2022 2:44:18 PM  By: [name] PA-C Entered By: [name] on 08/12/2022 14:44:18 Bartus, [name] ([id number]) -------------------------------------------------------------------------------- Physician Orders Details Patient Name: Iqbal, Karenann C. Date of Service: 08/12/2022 2:00 PM Medical Record Number: [id number] Patient Account Number: [id number] Date of Birth/Sex: 1961-12-24 (61 y.o. 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 o Nurse Visit as needed Home Health o Home Health Company: [facility] o [facility] for wound care. May utilize formulary equivalent dressing for wound treatment orders unless otherwise specified. Home Health Nurse may visit PRN to address patientos wound care needs. - Monday and Friday [facility] fax [phone] Bathing/ Shower/ Hygiene o May shower; gently cleanse wound with antibacterial soap, rinse and pat dry prior to dressing wounds o No tub bath. Anesthetic (Use 'Patient  Medications' Section for Anesthetic Order Entry) o Lidocaine applied to wound bed Edema Control - Lymphedema / Segmental Compressive Device / Other o Elevate, Exercise Daily and Avoid Standing for Long Periods of Time. o Elevate legs to the level of the heart and pump ankles as often as possible o Elevate leg(s) parallel to the floor when sitting. Additional Orders / Instructions o Follow Nutritious Diet and Increase Protein Intake Wound Treatment Wound #1 - Back Wound  Laterality: Midline, Distal Cleanser: Normal Saline 1 x Per Day/30 Days Discharge Instructions: Wash your hands with soap and water. Remove old dressing, discard into plastic bag and place into trash. Cleanse the wound with Normal Saline prior to applying a clean dressing using gauze sponges, not tissues or cotton balls. Do not scrub or use excessive force. Pat dry using gauze sponges, not tissue or cotton balls. Primary Dressing: Hydrofera Blue Rope 1 x Per Day/30 Days Discharge Instructions: cut into fourths; angle the end Secondary Dressing: (BORDER) Zetuvit Plus SILICONE BORDER Dressing 4x4 (in/in) 1 x Per Day/30 Days Electronic Signature(s) Signed: 08/12/2022 4:45:05 PM By: [name] PA-C Signed: 08/13/2022 8:41:24 AM By: Yevonne Pax RN Entered By: Yevonne Pax on 08/12/2022 14:33:12 [name], [name] ([id number]) -------------------------------------------------------------------------------- Problem List Details Patient Name: Stieber, [name] Date of Service: 08/12/2022 2:00 PM Medical Record Number: [id number] Patient Account Number: [id number] Date of Birth/Sex: 1961/07/05 (61 y.o. 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  ([id number]) -------------------------------------------------------------------------------- Progress Note Details Patient Name: [name], [name] Date of Service: 08/12/2022 2:00 PM Medical Record Number: [id number] Patient Account Number: [id number] Date of Birth/Sex: Oct 15, 1961 (61 y.o. F) Treating RN: Yevonne Pax Primary Care Provider: [name] Other Clinician: Referring Provider: Card, John Treating Provider/Extender: Rowan Blase in Treatment: 34 Subjective Chief Complaint Information obtained from Patient Surgical Back Ulcer History of Present Illness (HPI) 10/15/2021 upon evaluation today patient presents for initial evaluation here in the clinic concerning a surgical ulceration/dehiscence in the lumbar spine region following surgery that she had over the past year. This was actually broken up into 3 separate surgical events. The initial surgical intervention actually was on November 05, 2021 almost a year ago. Subsequently the patient went back in February for a seroma of the area which unfortunately required her to have a repeat surgery to go in and clean this out. And then again this occurred in April where she went back in and again they felt like stitches were coming out and there was an additional seroma. She was placed in a wound VAC initially and then subsequently as it got smaller that was discontinued. Again right now I will see anything that I think a wound VAC would help with. Nonetheless she definitely has a significant depth to the wound that is going require packing. I actually believe the Hydrofera Blue rope would probably do quite well with this the problem is as much as it is draining she probably needs this to be changed  at least every day. She does not really have anyone that can help with that that is the complicating scenario here. With that being said the patient does have a history of multiple sclerosis, hypertension, and again this surgical wound  dehiscence in regard to her lumbar spine region. She did have a repeat MRI which was actually completed 10/09/2021. This showed that there was no significant change in the subcutaneous fluid collection/track of the lower lumbar region. This is extending to the level of the fascia unfortunately. This seems to go all the way from the L2 level with a track extending all the way to the fascia at the L4-5 level. Again this is a significant wound and there is significant drainage but does not seem to communicate to the spinal region as far as spinal fluid or otherwise is concerned that is good news. Nonetheless she last saw Dr. Franky Macho who is her neurosurgeon on 10/01/2021 that was when he ordered this last MRI she supposed to see him next week as well. With that being said he did not feel like there was any significant issue there but was not sure why this was not healing that is when he ordered the MRI. They were wanting to make sure that this was packed appropriately by home health unfortunately the main issue currently is that home health is completely out of the picture as the patient has exhausted all the home health that that she gets for a year. She is now in a very difficult predicament where she does not have anyone to help her change the dressing and to be honest that she is not able to do it herself with the location of the wound being on the midline lumbar spine region. If she does not have anyone that can help it is probably can to be necessary for her to go to a facility for rehab and daily dressing changes as I feel like daily changes which is much drainage that she is having is going to be necessary. 10/23/2021 upon evaluation today patient appears to be doing decently well in regard to her back ulcer. This does seem to be draining a lot less than what it is been doing in the past. With that being said she still has quite a bit of drainage nonetheless. I do think that given time this  should improve least I hope so. The good news is she does have home health coming out 3 days a week were doing it 2 days a week and she is paying someone we can to help. 10/30/2021 upon evaluation today patient appears to be doing okay in regard to her back ulcer this is not draining quite as bad as it was in the beginning but he still has quite a bit of drainage noted. I do believe that the patient would benefit from [place] going ahead forward with attempting a wound VAC using the Hydrofera Blue rope to pack with and then subsequently using the VAC externally to actually suction out and help this to fill- in. I think this is our ideal way to try to get things cleared at this point. As it stands I am not certain that we are really making a progress that we want to see near with doing it in the way we are which is packing with the rope. It is a good dressing but I do think it is insufficient for total healing. She just seems to have too much in the way of drainage at this  point unfortunately. 11/06/2021 upon evaluation today patient unfortunately continues to have issues with her back ongoing. The good news is her MRI that was repeated showed signs of the size of this area in the lumbar spine region having decreased from 4 cm to 3.5 cm this is definitely not bad news at all. With that being said unfortunately she continues to have issues with ongoing drainage not as severe as in the beginning but nonetheless still significant. I do think a wound VAC still would be a good way to go although her home health agency nurse apparently has some concerns about the possibility of not being able to keep a seal with this as they had struggles in the past. Nonetheless I explained to the patient that this is much different than what she had previous and that I really feel like it would do much better as far as getting the area taken care of without having any complications or issues here. I think that we should be able  to maintain a seal. Nonetheless at this time I did discuss with the patient as well that she probably does need to have a wound VAC in order for [place] to get this moving in the right direction. 11/13/2021 upon evaluation patient's wound bed actually showed signs of significant drainage at this time. She did see the surgeon yesterday he did not see anything that appeared to be infected. Nonetheless he does appear that she is continuing to have areas here that just do not seem to want to seal up there MRI findings have been negative but nonetheless she continues to have is the seroma that is filling in. I do feel like we need to try to widen the hole so we can get at least a half of the [facility] then this will be better than nothing at this point. 11/20/2021 upon evaluation today patient actually appears to be having less pain at this point which is good news and overall she we still do not have the results of the culture back yet it had to be sent out to [facility] and we do not have the result back yet. Is doing decently well in regard to her wound. Fortunately there does not appear to be any signs of active infection systemically nor locally at this time. 11/27/2021 upon evaluation today patient appears to be doing well with regard to her wound all things considered there does appear to be less drainage than there was previous. Fortunately I do not see any evidence of worsening of the patient is stating that she is having some issues with back pain. This is somewhat new. Again this I think could be related to the fact that she is having some issues here with infection. We are still waiting to see what the result of her culture shows from susceptibility testing Enterococcus has been identified but we do not know if this is VRE or not. 12/04/2021 upon evaluation today patient appears to be doing well with regard to her wound all things considered. I did have a conversation with Erin from Dr. Sueanne Margarita  office. Of note she notes that Dr. Drinda Butts really does not want to do anything surgical right now which I completely [name], [name] ([id number]) understand. With that being said I am still leery of how far we will make it getting this to heal short of any type of surgery to open this up and allow [place] to more appropriately packed the wound. Nonetheless I will absolutely give it  our best shot as far as that is concerned. I discussed that with the patient today. She voiced understanding. The good news is the drainage today seems to be clear it is no longer cloudy as it was previous I am actually very pleased in that regard. 12/11/2021 since have last seen the patient actually did have an conversation with Dr. Franky Macho with her neurosurgeon. Again this involve the discussion around whether or not to open the wound and try to apply a wound VAC following. With that being said the decision was made that that may be the best thing to do if the patient was in agreement. Nonetheless I am actually extremely encouraged with what I am seeing today much more than I would have thought. In fact I think that we may be over packing the wound which is why she is having some discomfort at this point as there is much less of the dressing able to get and this time compared to what we saw previous. That is actually really good news. In fact the 6:00 tunnel appears to have filled in is awesome news. At the 12:00 location I am actually able to pack into that area pretty effectively at this point today. In fact I was able to get less of the packing in which I will detail below. 12/18/2021 upon evaluation today patient appears to be doing well with regard to her wound on the back. Fortunately there is no signs of active infection which is great news and overall very pleased with where things stand today. No fevers, chills, nausea, vomiting, or diarrhea. 01/01/2022 upon evaluation today patient appears to be doing decently well  in regard to her wound. Fortunately there does not appear to be any signs of anything worsening which is great news. She still continues to have issues with drainage but this is not nearly as significant as what it was in the past. There is all clear drainage no signs of purulence noted. 01/08/2022 upon evaluation today patient appears to be doing well with regard to her wound. With that being said she tells me that she has been having some increased pain over the past week. It does appear based on the amount of Hydrofera Blue that we removed this is probably over pack. Again it is difficult to know exactly how old the nurses getting all the skin but nonetheless the length of Hydrofera Blue that was utilized was way too much which may be part of the reason why this felt so uncomfortable. Fortunately I think that we can cut back on that we discussed pieces smaller so hopefully that will not be over packed. 01/22/2021 upon evaluation today patient appears to be doing well With regard to there being no signs of infection at this time. The wound does still tunnel mainly up at the 12:00 location. The depth of the tunnel complete from entry point to the base is about 3.5 cm today. With that being said I do believe that overall she is making good progress this is just a very slow process for her which I know has been frustrating as well. 01/29/2022 upon evaluation today patient appears to be doing well with regard to the wound on her lower back and the lumbar spine region. The good news is the depth that I got this week was a little bit less going up at the 12:00 location as compared to last week. I measured this to be 3.5 cm last week and it was 3.3 cm this week.  Again that is a minute shift but nonetheless a good shift in the right direction. Overall I think that we are on the right track. 02/05/2022 upon evaluation today patient appears to be doing well with regard to her wound. In fact right now her  measurements are somewhere around 2.9 cm which is definitely smaller and less deep than last week At the 12:00 location. Overall I am very pleased with what we are seeing. 02/19/2022 upon evaluation today patient unfortunately is noting that the wound is actually closing up externally but internally there is still some depth to this. I discussed with her today that we cannot let it close up like this is can end up being a significant issue for her if we allow for that. Subsequently we need to do what we can to try to open this up and keep it open more effectively. She voiced understanding. With that being said we are going to proceed with that procedure today in order to debride away some of the skin on externally that is trying to close and on this. 02/26/2022 upon evaluation patient appears to be doing better in my opinion after we had to open up this area last week. Again she has a significant amount of healing and overall I am extremely pleased with where things stand currently. I do not see any evidence of active infection locally nor systemically at this time which is great news and in general I think that we are on the right track for getting this hopefully closed final and done. 03/05/2022 upon evaluation today patient unfortunately is doing a little bit worse with regard to the whole size this is starting to get much smaller unfortunately. There does not appear to be any signs of active infection locally nor systemically which is great news. With that being said I am concerned about the fact that the patient is doing quite a bit worse with regard to trapping some fluid and almost feels like she may have a fluid pocket that not only goes in and up towards 12:00 but looks back around towards the more superficial subcutaneous tissue. I am very concerned that this is going to be something that is very hard for [place] to heal short of another surgery to open this up and try to wound VAC from the  inside out. Even then there is no guarantees this is just a very difficult situation to be perfectly honest. 03/14/2022 upon evaluation today patient appears to be doing well with regard to her wound as far as the area staying open and pleased in that regard. With that being said unfortunately she is not doing as well when it comes to the irritation around it appears to be very inflamed and red this is not good that was discussed with her today as well. Subsequently I do believe that working to need to do what we can do to try and calm this down in the past she did well with the Augmentin due to Enterococcus infection I am not sure if were dealing with the same thing or not but at the same time I think that is probably to be my go to at this point. 03/19/2022 upon evaluation today patient appears to be doing really about the same in regard to her wound that the pain is better. 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 She seems to be improved dramatically with the antibiotic currently although it does not cover for MRSA I am  apt to just see how this progresses over the next several days to a week. With that being said the bigger issue here is that we simply do not seem to be making excellent progress and I am concerned about the lack of progress. I discussed with Dr. Shon Baton with [facility] in [place] and to be honest his recommendation was that the best bet would probably be to get her to a teaching center. She is actually been seen for rheumatology and a I believe psychologist/psychiatrist at [facility]. She would prefer to go that direction as opposed to [facility] or [facility]. I am definitely okay with that. 03/26/2022 upon evaluation today patient appears to be doing okay in regard to her wound. She is not showing signs of worsening and overall she tells me that her pain is not nearly as bad as it was previous. Fortunately I do not see any evidence of active infection locally or  systemically which is great news. No fevers, chills, nausea, vomiting, or diarrhea. 04-02-2022 upon evaluation today patient appears to be doing well with regard to her wound. It does not appear to be showing any signs of worsening which is great news. 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 04-09-2022 upon evaluation today patient's wound actually appears to be doing about the same. Fortunately I do not see any evidence of active infection locally or systemically at this time which is great news. No fevers, chills, nausea, vomiting, or diarrhea. [name], COBO ([id number]) 04-16-2022 upon evaluation today patient appears to be doing well with regard to her wound in the lumbar spine region. Subsequently she continues to have an area that does have a tunnel up into the 12:00 location. This is about 3.5 cm using a skinny probe curved upwards to get into this region. With that being said I really do not see any signs of overall improvement but also do not see any signs of worsening I feel like work on about a still made here to some degree. The patient did see her neurologist and he has referred her to neurosurgery at [facility] for second opinion we will see what they have to say as well. 04-23-2022 upon evaluation today patient appears to be doing well with regard to her wound all things considered. She has been tolerating the dressing changes without complication. Fortunately I do not see any signs of active infection locally nor systemically which is great news. No fever chills noted 04-30-2022 upon evaluation today patient appears to be doing about the same in regard to her wound. The depth is really not dramatically improved as far as the 12:00  tunnel is concerned. The overall depth and the base of the wound does seem to be a little bit better it does sound like that external wound has closed as far as the opening is concerned we can have to cut this down from a half to a smaller size based on what we are seeing today. 05-07-2022 upon evaluation today patient's wound actually seems to be doing decently well. There is not a major shift here but a slight shift in the depth both straight and as well as in the [place] at 12:00. With that being said I again we will  talk about a couple of millimeters but still every little bit can count when there are situations like this to be honest. 05-14-2022 upon evaluation today patient appears to be doing okay in regard to her wound. I do not see any signs of anything worsening. With that being said also do not see getting significantly better unfortunately. There does not appear to be any evidence of infection locally or systemically which is great news. No fevers, chills, nausea, vomiting, or diarrhea. 05-21-2022 upon evaluation today patient actually appears to be doing quite well in regard to her wound from the standpoint of there being no infection. With that being said there does not appear to be any evidence of infection locally nor systemically which is great news. No fevers, chills, nausea, vomiting, or diarrhea. 05-28-2022 upon evaluation today patient's wound actually appears to be doing about the same. I do not see any evidence of active infection locally or systemically which is great news. No fevers, chills, nausea, vomiting, or diarrhea. 06-04-2022 upon evaluation today patient appears to be doing well with regard to her wound. She has been tolerating dressing changes without complication. Fortunately I do not feel like there is any worsening also I do not feel like there is any improvement. I feel like right a solid area here where she has a tunnel that tracks up at 12:00 and then has a fluid  pocket at around roughly 1:00 I can push on this and squeeze fluid out. Again I am not exactly sure what else can be done from my standpoint to improve this. She does have an appointment with Dr. Franky Macho who I do believe is an excellent surgeon and this is the surgeon who performed this surgery initially. Again the biggest issue was following she had a lot of trouble with the wound VAC I do think that we need to try to see if she is can have a wound VAC following surgery what exactly regular need to do to help keep this moving in the right direction for her. Obviously I want her to have the best result possible that she has to go through surgery again to clean this area out. Absolutely willing to help out with getting this to heal. 06-13-2022 upon evaluation today patient appears to be doing well with regard to her wound its not any worse unfortunately it is also not significantly better which is the only issue currently. I do not see any signs of active infection locally or systemically which is great news. No fevers, chills, nausea, vomiting, or diarrhea. She does have her appointment on Monday with her neurosurgeon and we will see you Dr. Franky Macho has to say at that point. 06-18-2022 upon evaluation patient appears to be doing a little worse in regard to her wound only in the respect that has been several days since she has had this changed in fact it was last changed on Friday. Since that time she tells me that she has kept this in place over the weekend and yesterday she was supposed to see her neurosurgeon yesterday but they had to cancel due to an emergency surgery according to what the patient tells me. Nonetheless she is having some issues here with drainage coming from the wound that is more purulent in nature I think this is something we have been seeing in the [facility] for several weeks but is more obvious being that it was not changed as much during the last week. She unfortunately is  a little worried  about this but again I think that this is just part of what we have been dealing with all long is more prevalent and obvious due to the length of time since it was changed last. She does have a repeat appointment for I believe July 10 she told me although she is going to see if she can find anything sooner. 06-25-2022 upon evaluation today patient's wound actually showed signs of marginal improvement which is good news. She still has the area of abscess that seems to be at roughly 2:00 when looking at her back. We can get into this with the skinny probe other than that I really do not know what else I can do to try to make this better. We discussed this and she does have an appointment with her neurosurgeon although that is not till mid July as he had to change it at the last moment during the time she was post to see him a week ago. She does have evidence of potential infection on culture I am going to go ahead and treat this in order to ensure that nothing worsens. With that being said I do believe that overall she seems to be doing well but I do think we want to make sure that that the knee gets worse in the meantime until she gets to see her neurosurgeon. 07-01-2022 upon evaluation today patient appears to continue to have trouble with her wound draining. Again we have been continue to monitor this and I still see the same issue that I have noted before the patient has an area which is draining seemingly from around the 2:00 location when you go up towards 12:00 and then over towards 2:00 looking at her back this is where there is actually a soft area external that if you push on it you will see fluid come out and subsequently this is also where it tracks to internally. Everything in the 6:00 location has completely healed in and this looks much better in that regard but this upper portion we just cannot get to closed with the methods that we have. It seemed to me that this is  probably going to require that this area be open and cleaned out in order to allow it to heal and my suggestion would be that a wound VAC is probably going to be the ideal thing. 07-09-2022 upon evaluation today patient appears to be doing somewhat poorly in regard to her wound. She did see Dr. Franky Macho. He stated that the wound was not being "packed appropriately" according to the patient and her daughter who were present during the evaluation today as well as the evaluation with him yesterday. With that being said he packed the wound the way he said it needed to be and to be partly honest that is over packed. The patient is having a tremendous amount of discomfort and states that this is no way that she is good to be able to tolerate that. I understand the concern about the whole closing down which is why I recommended trying to keep this open is much as possible and again with that all being said this is still going to continue to be an issue as far as this closing down before everything heals up on the inside. Nonetheless she continues to have issues with significant drainage. She also has an area which almost feels like a small abscess or least of fluid pocket that feels up that was not appropriately packed with the dressings that were  in place. With that being said I think that this is going to have to be opened at some point in time in order to get this to heal I am really not certain the best way to go about this. I discussed that with the patient again today. [name], [name] ([id number]) 07-16-2022 upon evaluation today patient appears to be doing well currently in regard to her wound from the standpoint of the pain getting better which is great news. Fortunately I do not see any evidence of active infection locally or systemically which is great news. No fevers, chills, nausea, vomiting, or diarrhea. With that being said the patient has been tolerating the dressing changes better without  complication which is great news. 07-29-2022 upon evaluation today patient appears to be doing actually pretty well in regard to her wound. Fortunately there does not appear to be any signs of infection. She still has drainage and we still are seeing an area from this pocket up at 12:00 that is leaking but fortunately this does not appear to be having any significant issues with infection right now which is great news. Fortunately her pain is also calm down since I saw her 2 weeks ago. 08-05-2022 upon evaluation today patient appears to be doing unfortunately a little worse in regard to the discomfort she is feeling she tells me that this is bothering her pretty much all the time at a low level. It never seems to go away. This is since last week. With that being said the overall depth is unfortunately a little bit deeper this week which is definitely not what I was hoping to see. I did get to review the second opinion that was requested by the patient from her insurance to have an independent physician review the situation here. The short story of that reviewed which I did read through the entirety of she states that the patient does likely need surgery to correct the seroma considering length of time this has been going on. It was recommended that a plastic surgeon be the one to have this up in case there is a muscle flap necessary but at minimum that this area needs to be cleaned out and then appropriately close following. The patient has also read through this. She does seem to be a little bit off of her normal game not feeling quite that well today. She is not exactly sure what is going on. 08-12-2022 upon evaluation today patient's wound is actually showing signs of maintaining stability which is good news. Fortunately there does not appear to be any evidence of active infection locally or systemically at this time which is excellent. With that being said she does have a meeting with her nurse with  [place] [facility] on Thursday to discuss options for plastic surgery where she is able to go. Objective Constitutional Well-nourished and well-hydrated in no acute distress. Vitals Time Taken: 2:21 PM, Height: 58 in, Weight: 275 lbs, BMI: 57.5, Temperature: 97.8 F, [name]: 75 bpm, Respiratory Rate: 18 breaths/min, Blood Pressure: 128/78 mmHg. Respiratory normal breathing without difficulty. Psychiatric this patient is able to make decisions and demonstrates good insight into disease process. Alert and Oriented x 3. pleasant and cooperative. General Notes: Upon inspection patient's wound actually does not appear to be any deeper than what it was previous is also not tunneling up at 12:00 and a deeper than what it was previous. Again this is not a huge open area but still we seem to be at a stalemate where  there is not a whole lot of improvement overall. Integumentary (Hair, Skin) Wound #1 status is Open. Original cause of wound was Surgical Injury. The date acquired was: 04/11/2021. The wound has been in treatment 43 weeks. The wound is located on the Distal,Midline Back. The wound measures 0.3cm length x 0.3cm width x 2.5cm depth; 0.071cm^2 area and 0.177cm^3 volume. There is Fat Layer (Subcutaneous Tissue) exposed. There is no tunneling noted, however, there is undermining starting at 9:00 and ending at 12:00 with a maximum distance of 3.2cm. There is a medium amount of serosanguineous drainage noted. There is large (67- 100%) red granulation within the wound bed. There is no necrotic tissue within the wound bed. 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 ([id number]) Plan Follow-up Appointments: Return Appointment in 1 week. Nurse Visit as needed Home Health: Home Health  Company: - [facility] for wound care. May utilize formulary equivalent dressing for wound treatment orders unless otherwise specified. Home Health Nurse may visit PRN to address patient s wound care needs. - Monday and Friday [facility] fax [phone] Bathing/ Shower/ Hygiene: May shower; gently cleanse wound with antibacterial soap, rinse and pat dry prior to dressing wounds No tub bath. Anesthetic (Use 'Patient Medications' Section for Anesthetic Order Entry): Lidocaine applied to wound bed Edema Control - Lymphedema / Segmental Compressive Device / Other: Elevate, Exercise Daily and Avoid Standing for Long Periods of Time. Elevate legs to the level of the heart and pump ankles as often as possible Elevate leg(s) parallel to the floor when sitting. Additional Orders / Instructions: Follow Nutritious Diet and Increase Protein Intake WOUND #1: - Back Wound Laterality: Midline, Distal Cleanser: Normal Saline 1 x Per Day/30 Days Discharge Instructions: Wash your hands with soap and water. Remove old dressing, discard into plastic bag and place into trash. Cleanse the wound with Normal Saline prior to applying a clean dressing using gauze sponges, not tissues or cotton balls. Do not scrub or use excessive force. Pat dry using gauze sponges, not tissue or cotton balls. Primary Dressing: Hydrofera Blue Rope 1 x Per Day/30 Days Discharge Instructions: cut into fourths; angle the end Secondary Dressing: (BORDER) Zetuvit Plus SILICONE BORDER Dressing 4x4 (in/in) 1 x Per Day/30 Days 1. I would recommend currently we continue with the [facility] Blue packing I think this is still good with the rope does seem to fill the space as best possible. 2. I am also can recommend that we have the patient continue to monitor for any signs of worsening or infection right now she is having a little bit of increased pain due to the dressing of the way it was placed by the nurse last week or normal  nurses back this week which is great news. 3. I am also can recommend that we have the patient continue to advise [place] if she has any complications or concerns if anything changes she knows to contact the office and let me know. Otherwise she will get in touch with [place] when she finds out where she can go for plastic surgery from her insurance. We will see patient back for reevaluation in 1 week here in the clinic. If anything worsens or changes patient will contact our office for additional recommendations. Electronic Signature(s) Signed: 08/12/2022 2:45:06 PM By: [name] PA-C Entered By: [name] on 08/12/2022 14:45:06 Fragoso, Kharis  C. ([id number]) -------------------------------------------------------------------------------- SuperBill Details Patient Name: Richburg, Camaya C. Date of Service: 08/12/2022 Medical Record Number: [id number] Patient Account Number: [id number] Date of Birth/Sex: 1961/12/10 (61 y.o. 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 G35 Multiple sclerosis I10 Essential (primary) hypertension Facility Procedures CPT4 Code: [id number] Description: [phone] - WOUND CARE VISIT-LEV 2 EST PT Modifier: Quantity: 1 Physician Procedures CPT4 Code: [id number] Description: 99213 - WC PHYS LEVEL 3 - EST PT Modifier: Quantity: 1 CPT4 Code: Description: ICD-10 Diagnosis Description T81.31XA Disruption of external operation (surgical) wound, not elsewhere classifi 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

## 2022-08-15 ENCOUNTER — Ambulatory Visit: Payer: 59

## 2022-08-16 ENCOUNTER — Encounter: Payer: 59 | Admitting: Occupational Therapy

## 2022-08-19 ENCOUNTER — Encounter: Payer: 59 | Admitting: Physician Assistant

## 2022-08-19 DIAGNOSIS — L98422 Non-pressure chronic ulcer of back with fat layer exposed: Secondary | ICD-10-CM | POA: Diagnosis not present

## 2022-08-19 NOTE — Progress Notes (Signed)
RAZAN, SILER ([id number]) Visit Report for 08/19/2022 Chief Complaint Document Details Patient Name: [name], [name] Date of Service: 08/19/2022 2:00 PM Medical Record Number: [id number] Patient Account Number: [id number] Date of Birth/Sex: 19-Jun-1961 (61 y.o. F) Treating RN: Yevonne Pax Primary Care Provider: [name] Other Clinician: Referring Provider: Card, John Treating Provider/Extender: Rowan Blase in Treatment: 105 Information Obtained from: Patient Chief Complaint Surgical Back Ulcer Electronic Signature(s) Signed: 08/19/2022 2:03:03 PM By: [name] PA-C Entered By: [name] on 08/19/2022 14:03:02 [name], [name] ([id number]) -------------------------------------------------------------------------------- HPI Details Patient Name: Landess, Deniyah C. Date of Service: 08/19/2022 2:00 PM Medical Record Number: [id number] Patient Account Number: [id number] Date of Birth/Sex: 21-Nov-1961 (61 y.o. 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 This was actually broken up into 3 separate surgical events. The initial surgical intervention actually was on November 05, 2021 almost a year ago. Subsequently the patient went back in February for a seroma of the area which unfortunately required her to have a repeat surgery to go in and clean this out. And then again this occurred in April where she went back in and again they felt like stitches were coming out and there was an additional seroma. She was placed in a wound VAC initially and then subsequently as it got smaller that  was discontinued. Again right now I will see anything that I think a wound VAC would help with. Nonetheless she definitely has a significant depth to the wound that is going require packing. I actually believe the Hydrofera Blue rope would probably do quite well with this the problem is as much as it is draining she probably needs this to be changed at least every day. She does not really have anyone that can help with that that is the complicating scenario here. With that being said the patient does have a history of multiple sclerosis, hypertension, and again this surgical wound dehiscence in regard to her lumbar spine region. She did have a repeat MRI which was actually completed 10/09/2021. This showed that there was no significant change in the subcutaneous fluid collection/track of the lower lumbar region. This is extending to the level of the fascia unfortunately. This seems to go all the way from the L2 level with a track extending all the way to the fascia at the L4-5 level. Again this is a significant wound and there is significant drainage but does not seem to communicate to the spinal region as far as spinal fluid or otherwise is concerned that is good news. Nonetheless she last saw Dr. Franky Macho who is her neurosurgeon on 10/01/2021 that was when he ordered this last MRI she supposed to see him next week as well. With that being said he did not feel like there was any significant issue there but was not sure why this was not healing that is when he ordered the MRI. They were wanting to make sure that this was packed appropriately by home health unfortunately the main issue currently is that home health is completely out of the picture as the patient has exhausted all the home health that that she gets for a year. She is now in a very difficult  predicament where she does not have anyone to help her change the dressing and to be honest that she is not able to do it herself with the location of  the wound being on the midline lumbar spine region. If she does not have anyone that can help it is probably can to be necessary for her to go to a facility for rehab and daily dressing changes as I feel like daily changes which is much drainage that she is having is going to be necessary. 10/23/2021 upon evaluation today patient appears to be doing decently well in regard to her back ulcer. This does seem to be draining a lot less than what it is been doing in the past. With that being said she still has quite a bit of drainage nonetheless. I do think that given time this should improve least I hope so. The good news is she does have home health coming out 3 days a week were doing it 2 days a week and she is paying someone we can to help. 10/30/2021 upon evaluation today patient appears to be doing okay in regard to her back ulcer this is not draining quite as bad as it was in the beginning but he still has quite a bit of drainage noted. I do believe that the patient would benefit from [place] going ahead forward with attempting a wound VAC using the Hydrofera Blue rope to pack with and then subsequently using the VAC externally to actually suction out and help this to fill- in. I think this is our ideal way to try to get things cleared at this point. As it stands I am not certain that we are really making a progress that we want to see near with doing it in the way we are which is packing with the rope. It is a good dressing but I do think it is insufficient for total healing. She just seems to have too much in the way of drainage at this point unfortunately. 11/06/2021 upon evaluation today patient unfortunately continues to have issues with her back ongoing. The good news is her MRI that was repeated showed signs of the size of this area in the lumbar spine region having decreased from 4 cm to 3.5 cm this is definitely not bad news at all. With that being said unfortunately she continues to have issues  with ongoing drainage not as severe as in the beginning but nonetheless still significant. I do think a wound VAC still would be a good way to go although her home health agency nurse apparently has some concerns about the possibility of not being able to keep a seal with this as they had struggles in the past. Nonetheless I explained to the patient that this is much different than what she had previous and that I really feel like it would do much better as far as getting the area taken care of without having any complications or issues here. I think that we should be able to maintain a seal. Nonetheless at this time I did discuss with the patient as well that she probably does need to have a wound VAC in order for [place] to get this moving in the right direction. 11/13/2021 upon evaluation patient's wound bed actually showed signs of significant drainage at this time. She did see the surgeon yesterday he did not see anything that appeared to be infected. Nonetheless he does appear that she is continuing to have areas here that just do not seem  to want to seal up there MRI findings have been negative but nonetheless she continues to have is the seroma that is filling in. I do feel like we need to try to widen the hole so we can get at least a half of the [facility] then this will be better than nothing at this point. 11/20/2021 upon evaluation today patient actually appears to be having less pain at this point which is good news and overall she we still do not have the results of the culture back yet it had to be sent out to [facility] and we do not have the result back yet. Is doing decently well in regard to her wound. Fortunately there does not appear to be any signs of active infection systemically nor locally at this time. 11/27/2021 upon evaluation today patient appears to be doing well with regard to her wound all things considered there does appear to be less drainage than there was previous.  Fortunately I do not see any evidence of worsening of the patient is stating that she is having some issues with back pain. This is somewhat new. Again this I think could be related to the fact that she is having some issues here with infection. We are still waiting to see what the result of her culture shows from susceptibility testing Enterococcus has been identified but we do not know if this is VRE or not. 12/04/2021 upon evaluation today patient appears to be doing well with regard to her wound all things considered. I did have a conversation with Erin from Dr. Sueanne Margarita office. Of note she notes that Dr. Drinda Butts really does not want to do anything surgical right now which I completely understand. With that being said I am still leery of how far we will make it getting this to heal short of any type of surgery to open this up and allow [place] to more appropriately packed the wound. Nonetheless I will absolutely give it our best shot as far as that is concerned. I discussed that with the patient today. She voiced understanding. The good news is the drainage today seems to be clear it is no longer cloudy as it was previous I am actually very pleased in that regard. [name], [name] ([id number]) 12/11/2021 since have last seen the patient actually did have an conversation with Dr. Franky Macho with her neurosurgeon. Again this involve the discussion around whether or not to open the wound and try to apply a wound VAC following. With that being said the decision was made that that may be the best thing to do if the patient was in agreement. Nonetheless I am actually extremely encouraged with what I am seeing today much more than I would have thought. In fact I think that we may be over packing the wound which is why she is having some discomfort at this point as there is much less of the dressing able to get and this time compared to what we saw previous. That is actually really good news. In fact the 6:00  tunnel appears to have filled in is awesome news. At the 12:00 location I am actually able to pack into that area pretty effectively at this point today. In fact I was able to get less of the packing in which I will detail below. 12/18/2021 upon evaluation today patient appears to be doing well with regard to her wound on the back. Fortunately there is no signs of active infection which is great news and overall  very pleased with where things stand today. No fevers, chills, nausea, vomiting, or diarrhea. 01/01/2022 upon evaluation today patient appears to be doing decently well in regard to her wound. Fortunately there does not appear to be any signs of anything worsening which is great news. She still continues to have issues with drainage but this is not nearly as significant as what it was in the past. There is all clear drainage no signs of purulence noted. 01/08/2022 upon evaluation today patient appears to be doing well with regard to her wound. With that being said she tells me that she has been having some increased pain over the past week. It does appear based on the amount of Hydrofera Blue that we removed this is probably over pack. Again it is difficult to know exactly how old the nurses getting all the skin but nonetheless the length of Hydrofera Blue that was utilized was way too much which may be part of the reason why this felt so uncomfortable. Fortunately I think that we can cut back on that we discussed pieces smaller so hopefully that will not be over packed. 01/22/2021 upon evaluation today patient appears to be doing well With regard to there being no signs of infection at this time. The wound does still tunnel mainly up at the 12:00 location. The depth of the tunnel complete from entry point to the base is about 3.5 cm today. With that being said I do believe that overall she is making good progress this is just a very slow process for her which I know has been frustrating as  well. 01/29/2022 upon evaluation today patient appears to be doing well with regard to the wound on her lower back and the lumbar spine region. The good news is the depth that I got this week was a little bit less going up at the 12:00 location as compared to last week. I measured this to be 3.5 cm last week and it was 3.3 cm this week. Again that is a minute shift but nonetheless a good shift in the right direction. Overall I think that we are on the right track. 02/05/2022 upon evaluation today patient appears to be doing well with regard to her wound. In fact right now her measurements are somewhere around 2.9 cm which is definitely smaller and less deep than last week At the 12:00 location. Overall I am very pleased with what we are seeing. 02/19/2022 upon evaluation today patient unfortunately is noting that the wound is actually closing up externally but internally there is still some depth to this. I discussed with her today that we cannot let it close up like this is can end up being a significant issue for her if we allow for that. Subsequently we need to do what we can to try to open this up and keep it open more effectively. She voiced understanding. With that being said we are going to proceed with that procedure today in order to debride away some of the skin on externally that is trying to close and on this. 02/26/2022 upon evaluation patient appears to be doing better in my opinion after we had to open up this area last week. Again she has a significant amount of healing and overall I am extremely pleased with where things stand currently. I do not see any evidence of active infection locally nor systemically at this time which is great news and in general I think that we are on the right track for getting  this hopefully closed final and done. 03/05/2022 upon evaluation today patient unfortunately is doing a little bit worse with regard to the whole size this is starting to get much  smaller unfortunately. There does not appear to be any signs of active infection locally nor systemically which is great news. With that being said I am concerned about the fact that the patient is doing quite a bit worse with regard to trapping some fluid and almost feels like she may have a fluid pocket that not only goes in and up towards 12:00 but looks back around towards the more superficial subcutaneous tissue. I am very concerned that this is going to be something that is very hard for [place] to heal short of another surgery to open this up and try to wound VAC from the inside out. Even then there is no guarantees this is just a very difficult situation to be perfectly honest. 03/14/2022 upon evaluation today patient appears to be doing well with regard to her wound as far as the area staying open and pleased in that regard. With that being said unfortunately she is not doing as well when it comes to the irritation around it appears to be very inflamed and red this is not good that was discussed with her today as well. Subsequently I do believe that working to need to do what we can do to try and calm this down in the past she did well with the Augmentin due to Enterococcus infection I am not sure if were dealing with the same thing or not but at the same time I think that is probably to be my go to at this point. 03/19/2022 upon evaluation today patient appears to be doing really about the same in regard to her wound that the pain is better. 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 She seems to be improved dramatically with the antibiotic currently although it does not cover for MRSA I am apt to just see how this progresses over the next several days to a week. With that being said the bigger issue here is that we simply do not seem to be making excellent progress and I am concerned about the lack of progress. I discussed with Dr. Shon Baton with [facility] in [place]  and to be honest his recommendation was that the best bet would probably be to get her to a teaching center. She is actually been seen for rheumatology and a I believe psychologist/psychiatrist at [facility]. She would prefer to go that direction as opposed to [facility] or [facility]. I am definitely okay with that. 03/26/2022 upon evaluation today patient appears to be doing okay in regard to her wound. She is not showing signs of worsening and overall she tells me that her pain is not nearly as bad as it was previous. Fortunately I do not see any evidence of active infection locally or systemically which is great news. No fevers, chills, nausea, vomiting, or diarrhea. 04-02-2022 upon evaluation today patient appears to be doing well with regard to her wound. It does not appear to be showing any signs of worsening which is great news. With that being said you are still having some issues here with drainage although it is clear and mainly just tenderness with bleeding from the [facility] I do think that treating for the second issue which is MRSA is probably the right thing to do at this point she is now off of the Augmentin. 04-09-2022 upon  evaluation today patient's wound actually appears to be doing about the same. Fortunately I do not see any evidence of active infection locally or systemically at this time which is great news. No fevers, chills, nausea, vomiting, or diarrhea. 04-16-2022 upon evaluation today patient appears to be doing well with regard to her wound in the lumbar spine region. Subsequently she continues to have an area that does have a tunnel up into the 12:00 location. This is about 3.5 cm using a skinny probe curved upwards to get into this region. With that being said I really do not see any signs of overall improvement but also do not see any signs of worsening I feel like work on about a still made here to some degree. The patient did see her neurologist and he has referred her to  neurosurgery at [facility] for second ULYANA, HARDBARGER. ([id number]) opinion we will see what they have to say as well. 04-23-2022 upon evaluation today patient appears to be doing well with regard to her wound all things considered. She has been tolerating the dressing changes without complication. Fortunately I do not see any signs of active infection locally nor systemically which is great news. No fever chills noted 04-30-2022 upon evaluation today patient appears to be doing about the same in regard to her wound. The depth is really not dramatically improved as far as the 12:00 tunnel is concerned. The overall depth and the base of the wound does seem to be a little bit better it does sound like that external wound has closed as far as the opening is concerned we can have to cut this down from a half to a smaller size based on what we are seeing today. 05-07-2022 upon evaluation today patient's wound actually seems to be doing decently well. There is not a major shift here but a slight shift in the depth both straight and as well as in the [place] at 12:00. With that being said I again we will talk about a couple of millimeters but still every little bit can count when there are situations like this to be honest. 05-14-2022 upon evaluation today patient appears to be doing okay in regard to her wound. I do not see any signs of anything worsening. With that being said also do not see getting significantly better unfortunately. There does not appear to be any evidence of infection locally or systemically which is great news. No fevers, chills, nausea, vomiting, or diarrhea. 05-21-2022 upon evaluation today patient actually appears to be doing quite well in regard to her wound from the standpoint of there being no infection. With that being said there does not appear to be any evidence of infection locally nor systemically which is great news. No fevers, chills, nausea, vomiting, or diarrhea. 05-28-2022  upon evaluation today patient's wound actually appears to be doing about the same. I do not see any evidence of active infection locally or systemically which is great news. No fevers, chills, nausea, vomiting, or diarrhea. 06-04-2022 upon evaluation today patient appears to be doing well with regard to her wound. She has been tolerating dressing changes without complication. Fortunately I do not feel like there is any worsening also I do not feel like there is any improvement. I feel like right a solid area here where she has a tunnel that tracks up at 12:00 and then has a fluid pocket at around roughly 1:00 I can push on this and squeeze fluid out. Again I am not exactly sure  what else can be done from my standpoint to improve this. She does have an appointment with Dr. Christella Noa who I do believe is an excellent surgeon and this is the surgeon who performed this surgery initially. Again the biggest issue was following she had a lot of trouble with the wound VAC I do think that we need to try to see if she is can have a wound VAC following surgery what exactly regular need to do to help keep this moving in the right direction for her. Obviously I want her to have the best result possible that she has to go through surgery again to clean this area out. Absolutely willing to help out with getting this to heal. 06-13-2022 upon evaluation today patient appears to be doing well with regard to her wound its not any worse unfortunately it is also not significantly better which is the only issue currently. I do not see any signs of active infection locally or systemically which is great news. No fevers, chills, nausea, vomiting, or diarrhea. She does have her appointment on Monday with her neurosurgeon and we will see you Dr. Christella Noa has to say at that point. 06-18-2022 upon evaluation patient appears to be doing a little worse in regard to her wound only in the respect that has been several days since she has had  this changed in fact it was last changed on Friday. Since that time she tells me that she has kept this in place over the weekend and yesterday she was supposed to see her neurosurgeon yesterday but they had to cancel due to an emergency surgery according to what the patient tells me. Nonetheless she is having some issues here with drainage coming from the wound that is more purulent in nature I think this is something we have been seeing in the [facility] for several weeks but is more obvious being that it was not changed as much during the last week. She unfortunately is a little worried about this but again I think that this is just part of what we have been dealing with all long is more prevalent and obvious due to the length of time since it was changed last. She does have a repeat appointment for I believe July 10 she told me although she is going to see if she can find anything sooner. 06-25-2022 upon evaluation today patient's wound actually showed signs of marginal improvement which is good news. She still has the area of abscess that seems to be at roughly 2:00 when looking at her back. We can get into this with the skinny probe other than that I really do not know what else I can do to try to make this better. We discussed this and she does have an appointment with her neurosurgeon although that is not till mid July as he had to change it at the last moment during the time she was post to see him a week ago. She does have evidence of potential infection on culture I am going to go ahead and treat this in order to ensure that nothing worsens. With that being said I do believe that overall she seems to be doing well but I do think we want to make sure that that the knee gets worse in the meantime until she gets to see her neurosurgeon. 07-01-2022 upon evaluation today patient appears to continue to have trouble with her wound draining. Again we have been continue to monitor this and I still  see  the same issue that I have noted before the patient has an area which is draining seemingly from around the 2:00 location when you go up towards 12:00 and then over towards 2:00 looking at her back this is where there is actually a soft area external that if you push on it you will see fluid come out and subsequently this is also where it tracks to internally. Everything in the 6:00 location has completely healed in and this looks much better in that regard but this upper portion we just cannot get to closed with the methods that we have. It seemed to me that this is probably going to require that this area be open and cleaned out in order to allow it to heal and my suggestion would be that a wound VAC is probably going to be the ideal thing. 07-09-2022 upon evaluation today patient appears to be doing somewhat poorly in regard to her wound. She did see Dr. Franky Macho. He stated that the wound was not being "packed appropriately" according to the patient and her daughter who were present during the evaluation today as well as the evaluation with him yesterday. With that being said he packed the wound the way he said it needed to be and to be partly honest that is over packed. The patient is having a tremendous amount of discomfort and states that this is no way that she is good to be able to tolerate that. I understand the concern about the whole closing down which is why I recommended trying to keep this open is much as possible and again with that all being said this is still going to continue to be an issue as far as this closing down before everything heals up on the inside. Nonetheless she continues to have issues with significant drainage. She also has an area which almost feels like a small abscess or least of fluid pocket that feels up that was not appropriately packed with the dressings that were in place. With that being said I think that this is going to have to be opened at some point in  time in order to get this to heal I am really not certain the best way to go about this. I discussed that with the patient again today. 07-16-2022 upon evaluation today patient appears to be doing well currently in regard to her wound from the standpoint of the pain getting better which is great news. Fortunately I do not see any evidence of active infection locally or systemically which is great news. No fevers, chills, nausea, vomiting, or diarrhea. With that being said the patient has been tolerating the dressing changes better without complication which is great news. [name], [name] ([id number]) 07-29-2022 upon evaluation today patient appears to be doing actually pretty well in regard to her wound. Fortunately there does not appear to be any signs of infection. She still has drainage and we still are seeing an area from this pocket up at 12:00 that is leaking but fortunately this does not appear to be having any significant issues with infection right now which is great news. Fortunately her pain is also calm down since I saw her 2 weeks ago. 08-05-2022 upon evaluation today patient appears to be doing unfortunately a little worse in regard to the discomfort she is feeling she tells me that this is bothering her pretty much all the time at a low level. It never seems to go away. This is since last week. With that being  said the overall depth is unfortunately a little bit deeper this week which is definitely not what I was hoping to see. I did get to review the second opinion that was requested by the patient from her insurance to have an independent physician review the situation here. The short story of that reviewed which I did read through the entirety of she states that the patient does likely need surgery to correct the seroma considering length of time this has been going on. It was recommended that a plastic surgeon be the one to have this up in case there is a muscle flap necessary but at  minimum that this area needs to be cleaned out and then appropriately close following. The patient has also read through this. She does seem to be a little bit off of her normal game not feeling quite that well today. She is not exactly sure what is going on. 08-12-2022 upon evaluation today patient's wound is actually showing signs of maintaining stability which is good news. Fortunately there does not appear to be any evidence of active infection locally or systemically at this time which is excellent. With that being said she does have a meeting with her nurse with [facility] on Thursday to discuss options for plastic surgery where she is able to go. 08-19-2022 upon evaluation today patient appears to be doing well currently in regard to her wound all things considered. Unfortunately the patient is still continuing to have some issues here with an ongoing open wound in the back despite everything that we have tried she has had an independent review which recommended surgical intervention. Where the process of trying to find someone to take her on as a patient. The suggestion was for plastic surgery which I think is probably a good option here. With that being said she has been looking through her insurance to see who was in network for her and has found someone that I think will be very good. Working to see about making that referral to them. Electronic Signature(s) Signed: 08/19/2022 2:43:09 PM By: [name] PA-C Entered By: [name] on 08/19/2022 14:43:09 Hyser, [name] ([id number]) -------------------------------------------------------------------------------- Physical Exam Details Patient Name: Beezley, Jemiah C. Date of Service: 08/19/2022 2:00 PM Medical Record Number: [id number] Patient Account Number: [id number] Date of Birth/Sex: 1961-04-10 (61 y.o. F) Treating RN: Yevonne Pax Primary Care Provider: [name] Other Clinician: Referring Provider: Card,  John Treating Provider/Extender: Rowan Blase in Treatment: 75 Constitutional Well-nourished and well-hydrated in no acute distress. Respiratory normal breathing without difficulty. Psychiatric this patient is able to make decisions and demonstrates good insight into disease process. Alert and Oriented x 3. pleasant and cooperative. Notes Upon inspection patient's wound really is maintaining about the same to straight in depth is right at 2.4 to 2.5 cm the depth going up towards 12:00 maintains right about 3.5 cm. Again were really just not seeing much improvement at all here unfortunately. Electronic Signature(s) Signed: 08/19/2022 2:43:40 PM By: [name] PA-C Entered By: [name] on 08/19/2022 14:43:40 Thoman, [name] ([id number]) -------------------------------------------------------------------------------- Physician Orders Details Patient Name: Trainer, Devon C. Date of Service: 08/19/2022 2:00 PM Medical Record Number: [id number] Patient Account Number: [id number] Date of Birth/Sex: [birth year] (61 y.o. 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 o Nurse Visit as needed Home Health o Home Health Company: [facility] o [facility] for wound care. May utilize formulary equivalent dressing for wound treatment orders unless otherwise specified. Home Health Nurse may visit PRN to address patientos wound care needs. - Monday and Friday [facility] fax  [phone] Bathing/ Shower/ Hygiene o May shower; gently cleanse wound with antibacterial soap, rinse and pat dry prior to dressing wounds o No tub bath. Anesthetic (Use 'Patient Medications' Section for Anesthetic Order Entry) o Lidocaine applied to wound bed Edema Control - Lymphedema / Segmental Compressive Device / Other o Elevate, Exercise Daily and Avoid Standing for Long Periods of Time. o Elevate legs to the level of the heart and pump ankles as often as possible o Elevate leg(s) parallel to the floor when sitting. Additional Orders / Instructions o Follow Nutritious Diet and Increase Protein Intake Wound Treatment Wound #1 - Back Wound Laterality: Midline, Distal Cleanser: Normal Saline 1 x Per Day/30 Days Discharge Instructions: Wash your hands with soap and water. Remove old dressing, discard into plastic bag and place into trash. Cleanse the wound with Normal Saline prior to applying a clean dressing using gauze sponges, not tissues or cotton balls. Do not scrub or use excessive force. Pat dry using gauze sponges, not tissue or cotton balls. Primary Dressing: Hydrofera Blue Rope 1 x Per Day/30 Days Discharge Instructions: cut into fourths; angle the end Secondary Dressing: (BORDER) Zetuvit Plus SILICONE BORDER Dressing 4x4 (in/in) 1 x Per Day/30 Days Electronic Signature(s) Unsigned Entered ByYevonne Pax on 08/19/2022 14:14:58 Signature(s): Date(s): Danielski, Precilla C. ([id number]) -------------------------------------------------------------------------------- Problem List Details Patient Name: Banbury, Ritaj C. Date of Service: 08/19/2022 2:00 PM Medical Record Number: [id number] Patient Account Number: [id number] Date of Birth/Sex: 1961-02-16 (61 y.o. 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 ([id number]) -------------------------------------------------------------------------------- Progress Note Details Patient Name: Domangue, Haset C. Date of Service: 08/19/2022 2:00 PM Medical Record Number: [id number] Patient Account Number: [id number] Date of Birth/Sex: 12/30/1961 (61 y.o. 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 This was actually broken up into 3 separate surgical events. The initial surgical intervention actually was on November 05, 2021 almost a year ago. Subsequently the patient went back in February for a seroma of the area which unfortunately required her to have a repeat surgery to go in and clean this out. And then again this occurred in April where she went back in  and again they felt like stitches were coming out and there was an additional seroma. She was placed in a wound VAC initially and then subsequently as it got smaller that was discontinued. Again  right now I will see anything that I think a wound VAC would help with. Nonetheless she definitely has a significant depth to the wound that is going require packing. I actually believe the Hydrofera Blue rope would probably do quite well with this the problem is as much as it is draining she probably needs this to be changed at least every day. She does not really have anyone that can help with that that is the complicating scenario here. With that being said the patient does have a history of multiple sclerosis, hypertension, and again this surgical wound dehiscence in regard to her lumbar spine region. She did have a repeat MRI which was actually completed 10/09/2021. This showed that there was no significant change in the subcutaneous fluid collection/track of the lower lumbar region. This is extending to the level of the fascia unfortunately. This seems to go all the way from the L2 level with a track extending all the way to the fascia at the L4-5 level. Again this is a significant wound and there is significant drainage but does not seem to communicate to the spinal region as far as spinal fluid or otherwise is concerned that is good news. Nonetheless she last saw Dr. Franky Macho who is her neurosurgeon on 10/01/2021 that was when he ordered this last MRI she supposed to see him next week as well. With that being said he did not feel like there was any significant issue there but was not sure why this was not healing that is when he ordered the MRI. They were wanting to make sure that this was packed appropriately by home health unfortunately the main issue currently is that home health is completely out of the picture as the patient has exhausted all the home health that that she gets for a year. She is now in a very difficult predicament where she does not have anyone to help her change the dressing and to be honest that she is not able to do it herself with the location of the wound being on the  midline lumbar spine region. If she does not have anyone that can help it is probably can to be necessary for her to go to a facility for rehab and daily dressing changes as I feel like daily changes which is much drainage that she is having is going to be necessary. 10/23/2021 upon evaluation today patient appears to be doing decently well in regard to her back ulcer. This does seem to be draining a lot less than what it is been doing in the past. With that being said she still has quite a bit of drainage nonetheless. I do think that given time this should improve least I hope so. The good news is she does have home health coming out 3 days a week were doing it 2 days a week and she is paying someone we can to help. 10/30/2021 upon evaluation today patient appears to be doing okay in regard to her back ulcer this is not draining quite as bad as it was in the beginning but he still has quite a bit of drainage noted. I do believe that the patient would benefit from [place] going ahead forward with attempting a wound VAC using the Hydrofera Blue rope to pack with and then  subsequently using the [facility] externally to actually suction out and help this to fill- in. I think this is our ideal way to try to get things cleared at this point. As it stands I am not certain that we are really making a progress that we want to see near with doing it in the way we are which is packing with the rope. It is a good dressing but I do think it is insufficient for total healing. She just seems to have too much in the way of drainage at this point unfortunately. 11/06/2021 upon evaluation today patient unfortunately continues to have issues with her back ongoing. The good news is her MRI that was repeated showed signs of the size of this area in the lumbar spine region having decreased from 4 cm to 3.5 cm this is definitely not bad news at all. With that being said unfortunately she continues to have issues with ongoing drainage  not as severe as in the beginning but nonetheless still significant. I do think a wound VAC still would be a good way to go although her home health agency nurse apparently has some concerns about the possibility of not being able to keep a seal with this as they had struggles in the past. Nonetheless I explained to the patient that this is much different than what she had previous and that I really feel like it would do much better as far as getting the area taken care of without having any complications or issues here. I think that we should be able to maintain a seal. Nonetheless at this time I did discuss with the patient as well that she probably does need to have a wound VAC in order for [place] to get this moving in the right direction. 11/13/2021 upon evaluation patient's wound bed actually showed signs of significant drainage at this time. She did see the surgeon yesterday he did not see anything that appeared to be infected. Nonetheless he does appear that she is continuing to have areas here that just do not seem to want to seal up there MRI findings have been negative but nonetheless she continues to have is the seroma that is filling in. I do feel like we need to try to widen the hole so we can get at least a half of the [facility] then this will be better than nothing at this point. 11/20/2021 upon evaluation today patient actually appears to be having less pain at this point which is good news and overall she we still do not have the results of the culture back yet it had to be sent out to [facility] and we do not have the result back yet. Is doing decently well in regard to her wound. Fortunately there does not appear to be any signs of active infection systemically nor locally at this time. 11/27/2021 upon evaluation today patient appears to be doing well with regard to her wound all things considered there does appear to be less drainage than there was previous. Fortunately I do not see  any evidence of worsening of the patient is stating that she is having some issues with back pain. This is somewhat new. Again this I think could be related to the fact that she is having some issues here with infection. We are still waiting to see what the result of her culture shows from susceptibility testing Enterococcus has been identified but we do not know if this is VRE or not. 12/04/2021 upon evaluation today  patient appears to be doing well with regard to her wound all things considered. I did have a conversation with Erin from Dr. Sueanne Margarita office. Of note she notes that Dr. Drinda Butts really does not want to do anything surgical right now which I completely Narayan, Makeshia C. ([id number]) understand. With that being said I am still leery of how far we will make it getting this to heal short of any type of surgery to open this up and allow [place] to more appropriately packed the wound. Nonetheless I will absolutely give it our best shot as far as that is concerned. I discussed that with the patient today. She voiced understanding. The good news is the drainage today seems to be clear it is no longer cloudy as it was previous I am actually very pleased in that regard. 12/11/2021 since have last seen the patient actually did have an conversation with Dr. Franky Macho with her neurosurgeon. Again this involve the discussion around whether or not to open the wound and try to apply a wound VAC following. With that being said the decision was made that that may be the best thing to do if the patient was in agreement. Nonetheless I am actually extremely encouraged with what I am seeing today much more than I would have thought. In fact I think that we may be over packing the wound which is why she is having some discomfort at this point as there is much less of the dressing able to get and this time compared to what we saw previous. That is actually really good news. In fact the 6:00 tunnel appears to have  filled in is awesome news. At the 12:00 location I am actually able to pack into that area pretty effectively at this point today. In fact I was able to get less of the packing in which I will detail below. 12/18/2021 upon evaluation today patient appears to be doing well with regard to her wound on the back. Fortunately there is no signs of active infection which is great news and overall very pleased with where things stand today. No fevers, chills, nausea, vomiting, or diarrhea. 01/01/2022 upon evaluation today patient appears to be doing decently well in regard to her wound. Fortunately there does not appear to be any signs of anything worsening which is great news. She still continues to have issues with drainage but this is not nearly as significant as what it was in the past. There is all clear drainage no signs of purulence noted. 01/08/2022 upon evaluation today patient appears to be doing well with regard to her wound. With that being said she tells me that she has been having some increased pain over the past week. It does appear based on the amount of Hydrofera Blue that we removed this is probably over pack. Again it is difficult to know exactly how old the nurses getting all the skin but nonetheless the length of Hydrofera Blue that was utilized was way too much which may be part of the reason why this felt so uncomfortable. Fortunately I think that we can cut back on that we discussed pieces smaller so hopefully that will not be over packed. 01/22/2021 upon evaluation today patient appears to be doing well With regard to there being no signs of infection at this time. The wound does still tunnel mainly up at the 12:00 location. The depth of the tunnel complete from entry point to the base is about 3.5 cm today. With that being said  I do believe that overall she is making good progress this is just a very slow process for her which I know has been frustrating as well. 01/29/2022 upon  evaluation today patient appears to be doing well with regard to the wound on her lower back and the lumbar spine region. The good news is the depth that I got this week was a little bit less going up at the 12:00 location as compared to last week. I measured this to be 3.5 cm last week and it was 3.3 cm this week. Again that is a minute shift but nonetheless a good shift in the right direction. Overall I think that we are on the right track. 02/05/2022 upon evaluation today patient appears to be doing well with regard to her wound. In fact right now her measurements are somewhere around 2.9 cm which is definitely smaller and less deep than last week At the 12:00 location. Overall I am very pleased with what we are seeing. 02/19/2022 upon evaluation today patient unfortunately is noting that the wound is actually closing up externally but internally there is still some depth to this. I discussed with her today that we cannot let it close up like this is can end up being a significant issue for her if we allow for that. Subsequently we need to do what we can to try to open this up and keep it open more effectively. She voiced understanding. With that being said we are going to proceed with that procedure today in order to debride away some of the skin on externally that is trying to close and on this. 02/26/2022 upon evaluation patient appears to be doing better in my opinion after we had to open up this area last week. Again she has a significant amount of healing and overall I am extremely pleased with where things stand currently. I do not see any evidence of active infection locally nor systemically at this time which is great news and in general I think that we are on the right track for getting this hopefully closed final and done. 03/05/2022 upon evaluation today patient unfortunately is doing a little bit worse with regard to the whole size this is starting to get much smaller unfortunately. There  does not appear to be any signs of active infection locally nor systemically which is great news. With that being said I am concerned about the fact that the patient is doing quite a bit worse with regard to trapping some fluid and almost feels like she may have a fluid pocket that not only goes in and up towards 12:00 but looks back around towards the more superficial subcutaneous tissue. I am very concerned that this is going to be something that is very hard for [place] to heal short of another surgery to open this up and try to wound VAC from the inside out. Even then there is no guarantees this is just a very difficult situation to be perfectly honest. 03/14/2022 upon evaluation today patient appears to be doing well with regard to her wound as far as the area staying open and pleased in that regard. With that being said unfortunately she is not doing as well when it comes to the irritation around it appears to be very inflamed and red this is not good that was discussed with her today as well. Subsequently I do believe that working to need to do what we can do to try and calm this down in the past she  did well with the Augmentin due to Enterococcus infection I am not sure if were dealing with the same thing or not but at the same time I think that is probably to be my go to at this point. 03/19/2022 upon evaluation today patient appears to be doing really about the same in regard to her wound that the pain is better. 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 She seems to be improved dramatically with the antibiotic currently although it does not cover for MRSA I am apt to just see how this progresses over the next several days to a week. With that being said the bigger issue here is that we simply do not seem to be making excellent progress and I am concerned about the lack of progress. I discussed with Dr. Shon Baton with [facility] in [place] and to be honest his  recommendation was that the best bet would probably be to get her to a teaching center. She is actually been seen for rheumatology and a I believe psychologist/psychiatrist at [facility]. She would prefer to go that direction as opposed to [facility] or [facility]. I am definitely okay with that. 03/26/2022 upon evaluation today patient appears to be doing okay in regard to her wound. She is not showing signs of worsening and overall she tells me that her pain is not nearly as bad as it was previous. Fortunately I do not see any evidence of active infection locally or systemically which is great news. No fevers, chills, nausea, vomiting, or diarrhea. 04-02-2022 upon evaluation today patient appears to be doing well with regard to her wound. It does not appear to be showing any signs of worsening which is great news. With that being said you are still having some issues here with drainage although it is clear and mainly just tenderness with bleeding from the [facility] I do think that treating for the second issue which is MRSA is probably the right thing to do at this point she is now off of the Augmentin. 04-09-2022 upon evaluation today patient's wound actually appears to be doing about the same. Fortunately I do not see any evidence of active infection locally or systemically at this time which is great news. No fevers, chills, nausea, vomiting, or diarrhea. [name], [name] ([id number]) 04-16-2022 upon evaluation today patient appears to be doing well with regard to her wound in the lumbar spine region. Subsequently she continues to have an area that does have a tunnel up into the 12:00 location. This is about 3.5 cm using a skinny probe curved upwards to get into this region. With that being said I really do not see any signs of overall improvement but also do not see any signs of worsening I feel like work on about a still made here to some degree. The patient did see her neurologist and he has referred her  to neurosurgery at [facility] for second opinion we will see what they have to say as well. 04-23-2022 upon evaluation today patient appears to be doing well with regard to her wound all things considered. She has been tolerating the dressing changes without complication. Fortunately I do not see any signs of active infection locally nor systemically which is great news. No fever chills noted 04-30-2022 upon evaluation today patient appears to be doing about the same in regard to her wound. The depth is really not dramatically improved as far as the 12:00 tunnel is concerned. The overall depth and the base  of the wound does seem to be a little bit better it does sound like that external wound has closed as far as the opening is concerned we can have to cut this down from a half to a smaller size based on what we are seeing today. 05-07-2022 upon evaluation today patient's wound actually seems to be doing decently well. There is not a major shift here but a slight shift in the depth both straight and as well as in the [place] at 12:00. With that being said I again we will talk about a couple of millimeters but still every little bit can count when there are situations like this to be honest. 05-14-2022 upon evaluation today patient appears to be doing okay in regard to her wound. I do not see any signs of anything worsening. With that being said also do not see getting significantly better unfortunately. There does not appear to be any evidence of infection locally or systemically which is great news. No fevers, chills, nausea, vomiting, or diarrhea. 05-21-2022 upon evaluation today patient actually appears to be doing quite well in regard to her wound from the standpoint of there being no infection. With that being said there does not appear to be any evidence of infection locally nor systemically which is great news. No fevers, chills, nausea, vomiting, or diarrhea. 05-28-2022 upon evaluation today patient's  wound actually appears to be doing about the same. I do not see any evidence of active infection locally or systemically which is great news. No fevers, chills, nausea, vomiting, or diarrhea. 06-04-2022 upon evaluation today patient appears to be doing well with regard to her wound. She has been tolerating dressing changes without complication. Fortunately I do not feel like there is any worsening also I do not feel like there is any improvement. I feel like right a solid area here where she has a tunnel that tracks up at 12:00 and then has a fluid pocket at around roughly 1:00 I can push on this and squeeze fluid out. Again I am not exactly sure what else can be done from my standpoint to improve this. She does have an appointment with Dr. Franky Macho who I do believe is an excellent surgeon and this is the surgeon who performed this surgery initially. Again the biggest issue was following she had a lot of trouble with the wound VAC I do think that we need to try to see if she is can have a wound VAC following surgery what exactly regular need to do to help keep this moving in the right direction for her. Obviously I want her to have the best result possible that she has to go through surgery again to clean this area out. Absolutely willing to help out with getting this to heal. 06-13-2022 upon evaluation today patient appears to be doing well with regard to her wound its not any worse unfortunately it is also not significantly better which is the only issue currently. I do not see any signs of active infection locally or systemically which is great news. No fevers, chills, nausea, vomiting, or diarrhea. She does have her appointment on Monday with her neurosurgeon and we will see you Dr. Franky Macho has to say at that point. 06-18-2022 upon evaluation patient appears to be doing a little worse in regard to her wound only in the respect that has been several days since she has had this changed in fact it was  last changed on Friday. Since that time she tells me  that she has kept this in place over the weekend and yesterday she was supposed to see her neurosurgeon yesterday but they had to cancel due to an emergency surgery according to what the patient tells me. Nonetheless she is having some issues here with drainage coming from the wound that is more purulent in nature I think this is something we have been seeing in the [facility] for several weeks but is more obvious being that it was not changed as much during the last week. She unfortunately is a little worried about this but again I think that this is just part of what we have been dealing with all long is more prevalent and obvious due to the length of time since it was changed last. She does have a repeat appointment for I believe July 10 she told me although she is going to see if she can find anything sooner. 06-25-2022 upon evaluation today patient's wound actually showed signs of marginal improvement which is good news. She still has the area of abscess that seems to be at roughly 2:00 when looking at her back. We can get into this with the skinny probe other than that I really do not know what else I can do to try to make this better. We discussed this and she does have an appointment with her neurosurgeon although that is not till mid July as he had to change it at the last moment during the time she was post to see him a week ago. She does have evidence of potential infection on culture I am going to go ahead and treat this in order to ensure that nothing worsens. With that being said I do believe that overall she seems to be doing well but I do think we want to make sure that that the knee gets worse in the meantime until she gets to see her neurosurgeon. 07-01-2022 upon evaluation today patient appears to continue to have trouble with her wound draining. Again we have been continue to monitor this and I still see the same issue that I  have noted before the patient has an area which is draining seemingly from around the 2:00 location when you go up towards 12:00 and then over towards 2:00 looking at her back this is where there is actually a soft area external that if you push on it you will see fluid come out and subsequently this is also where it tracks to internally. Everything in the 6:00 location has completely healed in and this looks much better in that regard but this upper portion we just cannot get to closed with the methods that we have. It seemed to me that this is probably going to require that this area be open and cleaned out in order to allow it to heal and my suggestion would be that a wound VAC is probably going to be the ideal thing. 07-09-2022 upon evaluation today patient appears to be doing somewhat poorly in regard to her wound. She did see Dr. Franky Macho. He stated that the wound was not being "packed appropriately" according to the patient and her daughter who were present during the evaluation today as well as the evaluation with him yesterday. With that being said he packed the wound the way he said it needed to be and to be partly honest that is over packed. The patient is having a tremendous amount of discomfort and states that this is no way that she is good to be able to  tolerate that. I understand the concern about the whole closing down which is why I recommended trying to keep this open is much as possible and again with that all being said this is still going to continue to be an issue as far as this closing down before everything heals up on the inside. Nonetheless she continues to have issues with significant drainage. She also has an area which almost feels like a small abscess or least of fluid pocket that feels up that was not appropriately packed with the dressings that were in place. With that being said I think that this is going to have to be opened at some point in time in order to get this to  heal I am really not certain the best way to go about this. I discussed that with the patient again today. JACOYA, BAUMAN ([id number]) 07-16-2022 upon evaluation today patient appears to be doing well currently in regard to her wound from the standpoint of the pain getting better which is great news. Fortunately I do not see any evidence of active infection locally or systemically which is great news. No fevers, chills, nausea, vomiting, or diarrhea. With that being said the patient has been tolerating the dressing changes better without complication which is great news. 07-29-2022 upon evaluation today patient appears to be doing actually pretty well in regard to her wound. Fortunately there does not appear to be any signs of infection. She still has drainage and we still are seeing an area from this pocket up at 12:00 that is leaking but fortunately this does not appear to be having any significant issues with infection right now which is great news. Fortunately her pain is also calm down since I saw her 2 weeks ago. 08-05-2022 upon evaluation today patient appears to be doing unfortunately a little worse in regard to the discomfort she is feeling she tells me that this is bothering her pretty much all the time at a low level. It never seems to go away. This is since last week. With that being said the overall depth is unfortunately a little bit deeper this week which is definitely not what I was hoping to see. I did get to review the second opinion that was requested by the patient from her insurance to have an independent physician review the situation here. The short story of that reviewed which I did read through the entirety of she states that the patient does likely need surgery to correct the seroma considering length of time this has been going on. It was recommended that a plastic surgeon be the one to have this up in case there is a muscle flap necessary but at minimum that this area  needs to be cleaned out and then appropriately close following. The patient has also read through this. She does seem to be a little bit off of her normal game not feeling quite that well today. She is not exactly sure what is going on. 08-12-2022 upon evaluation today patient's wound is actually showing signs of maintaining stability which is good news. Fortunately there does not appear to be any evidence of active infection locally or systemically at this time which is excellent. With that being said she does have a meeting with her nurse with [facility] on Thursday to discuss options for plastic surgery where she is able to go. 08-19-2022 upon evaluation today patient appears to be doing well currently in regard to her wound all things considered. Unfortunately the patient  is still continuing to have some issues here with an ongoing open wound in the back despite everything that we have tried she has had an independent review which recommended surgical intervention. Where the process of trying to find someone to take her on as a patient. The suggestion was for plastic surgery which I think is probably a good option here. With that being said she has been looking through her insurance to see who was in network for her and has found someone that I think will be very good. Working to see about making that referral to them. Objective Constitutional Well-nourished and well-hydrated in no acute distress. Vitals Time Taken: 2:02 PM, Height: 58 in, Weight: 275 lbs, BMI: 57.5, Temperature: 98.2 F, [name]: 87 bpm, Respiratory Rate: 18 breaths/min, Blood Pressure: 138/70 mmHg. Respiratory normal breathing without difficulty. Psychiatric this patient is able to make decisions and demonstrates good insight into disease process. Alert and Oriented x 3. pleasant and cooperative. General Notes: Upon inspection patient's wound really is maintaining about the same to straight in depth is right at 2.4 to  2.5 cm the depth going up towards 12:00 maintains right about 3.5 cm. Again were really just not seeing much improvement at all here unfortunately. Integumentary (Hair, Skin) Wound #1 status is Open. Original cause of wound was Surgical Injury. The date acquired was: 04/11/2021. The wound has been in treatment 44 weeks. The wound is located on the Distal,Midline Back. The wound measures 0.3cm length x 0.3cm width x 2.2cm depth; 0.071cm^2 area and 0.156cm^3 volume. There is Fat Layer (Subcutaneous Tissue) exposed. There is no tunneling or undermining noted. There is a medium amount of serosanguineous drainage noted. There is large (67-100%) red granulation within the wound bed. There is no necrotic tissue within the wound bed. 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 ([id number]) Plan Follow-up Appointments: Return Appointment in 1 week. Nurse Visit as needed Home Health: Home Health Company: - [facility] for wound care. May utilize formulary equivalent dressing for wound treatment orders unless otherwise specified. Home Health Nurse may visit PRN to address patient s wound care needs. - Monday and Friday [facility] fax [phone] Bathing/ Shower/ Hygiene: May shower; gently cleanse wound with antibacterial soap, rinse and pat dry prior to dressing wounds No tub bath. Anesthetic (Use 'Patient Medications' Section for Anesthetic Order Entry): Lidocaine applied to wound bed Edema Control - Lymphedema / Segmental Compressive Device / Other: Elevate, Exercise Daily and Avoid Standing for Long Periods of Time. Elevate legs to the level of the heart and pump ankles as often as possible Elevate leg(s) parallel to the floor when sitting. Additional Orders /  Instructions: Follow Nutritious Diet and Increase Protein Intake WOUND #1: - Back Wound Laterality: Midline, Distal Cleanser: Normal Saline 1 x Per Day/30 Days Discharge Instructions: Wash your hands with soap and water. Remove old dressing, discard into plastic bag and place into trash. Cleanse the wound with Normal Saline prior to applying a clean dressing using gauze sponges, not tissues or cotton balls. Do not scrub or use excessive force. Pat dry using gauze sponges, not tissue or cotton balls. Primary Dressing: Hydrofera Blue Rope 1 x Per Day/30 Days Discharge Instructions: cut into fourths; angle the end Secondary Dressing: (BORDER) Zetuvit Plus SILICONE BORDER Dressing 4x4 (in/in) 1 x Per Day/30 Days 1. Based on what I am  seeing regular continue with the Hydrofera Blue rope which has at least cut this open and draining so it does not seal up an abscess. 2. I am good recommend as well we continue with the bordered foam dressing to cover. 3. I am good to suggest that the patient should be seen by plastic surgery soon as possible it has been suggested to her independent review through her insurance that she may even require muscle flap but not really a plastic surgeon therefore cannot make this determination but I do note her wound is not making any progress past what we made it to at this point. We are pretty much stalled at the current state and has been this way for months. For that reason I do believe if we get him to plastic surgery the sooner the better try to get this healed. We will see patient back for reevaluation in 1 week here in the clinic. If anything worsens or changes patient will contact our office for additional recommendations. Electronic Signature(s) Signed: 08/19/2022 2:46:07 PM By: [name] PA-C Entered By: [name] on 08/19/2022 14:46:07 Tirrell, [name]  ([id number]) -------------------------------------------------------------------------------- SuperBill Details Patient Name: Rodenbeck, Frederick C. Date of Service: 08/19/2022 Medical Record Number: [id number] Patient Account Number: [id number] Date of Birth/Sex: April 12, 1961 (61 y.o. 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 G35 Multiple sclerosis I10 Essential (primary) hypertension Facility Procedures CPT4 Code: [id number] Description: [id number] - WOUND CARE VISIT-LEV 2 EST PT Modifier: Quantity: 1 Physician Procedures CPT4 Code: [id number] Description: 99214 - WC PHYS LEVEL 4 - EST PT Modifier: Quantity: 1 CPT4 Code: Description: ICD-10 Diagnosis Description T81.31XA Disruption of external operation (surgical) wound, not elsewhere classifi 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

## 2022-08-20 ENCOUNTER — Ambulatory Visit: Payer: 59 | Admitting: Physician Assistant

## 2022-08-22 ENCOUNTER — Ambulatory Visit: Payer: 59

## 2022-08-22 NOTE — Progress Notes (Addendum)
MYSTI, HALEY ([id number]) Visit Report for 08/19/2022 Arrival Information Details Patient Name: [name], [name] Date of Service: 08/19/2022 2:00 PM Medical Record Number: [id number] Patient Account Number: [id number] Date of Birth/Sex: 09-23-1961 (61 y.o. F) Treating RN: Yevonne Pax Primary Care Chace Klippel: Card, Jonny Ruiz Other Clinician: Referring Merdith Boyd: Card, John Treating Nneka Blanda/Extender: Rowan Blase in Treatment: 39 Visit Information History Since Last Visit All ordered tests and consults were completed: No Patient Arrived: [name] Added or deleted any medications: No Arrival Time: 14:02 Any new allergies or adverse reactions: No Accompanied By: self Had a fall or experienced change in No Transfer Assistance: None activities of daily living that may affect Patient Identification Verified: Yes risk of falls: Secondary Verification Process Completed: Yes Signs or symptoms of abuse/neglect since last visito No Patient Requires Transmission-Based Precautions: No Hospitalized since last visit: No Patient Has Alerts: No Implantable device outside of the clinic excluding No cellular tissue based products placed in the center since last visit: Has Dressing in Place as Prescribed: Yes Pain Present Now: No Electronic Signature(s) Signed: 08/22/2022 4:19:23 PM By: Yevonne Pax RN Entered By: Yevonne Pax on 08/19/2022 14:02:35 Krabbenhoft, Shelita C. ([id number]) -------------------------------------------------------------------------------- Clinic Level of Care Assessment Details Patient Name: [name], [name] Date of Service: 08/19/2022 2:00 PM Medical Record Number: [id number] Patient Account Number: [id number] Date of Birth/Sex: 09-12-1961 (61 y.o. F) Treating RN: Yevonne Pax Primary Care Jahnasia Tatum: Card, Jonny Ruiz Other Clinician: Referring Prestyn Stanco: Card, John Treating Rayanna Matusik/Extender: Rowan Blase in Treatment: 21 Clinic Level of Care Assessment Items TOOL 4  Quantity Score X - Use when only an EandM is performed on FOLLOW-UP visit 1 0 ASSESSMENTS - Nursing Assessment / Reassessment X - Reassessment of Co-morbidities (includes updates in patient status) 1 10 X- 1 5 Reassessment of Adherence to Treatment Plan ASSESSMENTS - Wound and Skin Assessment / Reassessment X - Simple Wound Assessment / Reassessment - one wound 1 5 []  - 0 Complex Wound Assessment / Reassessment - multiple wounds []  - 0 Dermatologic / Skin Assessment (not related to wound area) ASSESSMENTS - Focused Assessment []  - Circumferential Edema Measurements - multi extremities 0 []  - 0 Nutritional Assessment / Counseling / Intervention []  - 0 Lower Extremity Assessment (monofilament, tuning fork, pulses) []  - 0 Peripheral Arterial Disease Assessment (using hand held doppler) ASSESSMENTS - Ostomy and/or Continence Assessment and Care []  - Incontinence Assessment and Management 0 []  - 0 Ostomy Care Assessment and Management (repouching, etc.) PROCESS - Coordination of Care X - Simple Patient / Family Education for ongoing care 1 15 []  - 0 Complex (extensive) Patient / Family Education for ongoing care []  - 0 Staff obtains , Records, Test Results / Process Orders []  - 0 Staff telephones HHA, Nursing Homes / Clarify orders / etc []  - 0 Routine Transfer to another Facility (non-emergent condition) []  - 0 Routine Hospital Admission (non-emergent condition) []  - 0 New Admissions / / Ordering NPWT, Apligraf, etc. []  - 0 Emergency Hospital Admission (emergent condition) X- 1 10 Simple Discharge Coordination []  - 0 Complex (extensive) Discharge Coordination PROCESS - Special Needs []  - Pediatric / Minor Patient Management 0 []  - 0 Isolation Patient Management []  - 0 Hearing / Language / Visual special needs []  - 0 Assessment of Community assistance (transportation, D/C planning, etc.) []  - 0 Additional assistance / Altered  mentation []  - 0 Support Surface(s) Assessment (bed, cushion, seat, etc.) INTERVENTIONS - Wound Cleansing / Measurement [name], [name] ( ) X- 1 5 Simple Wound Cleansing -  one wound []  - 0 Complex Wound Cleansing - multiple wounds X- 1 5 Wound Imaging (photographs - any number of wounds) []  - 0 Wound Tracing (instead of photographs) X- 1 5 Simple Wound Measurement - one wound []  - 0 Complex Wound Measurement - multiple wounds INTERVENTIONS - Wound Dressings X - Small Wound Dressing one or multiple wounds 1 10 []  - 0 Medium Wound Dressing one or multiple wounds []  - 0 Large Wound Dressing one or multiple wounds []  - 0 Application of Medications - topical []  - 0 Application of Medications - injection INTERVENTIONS - Miscellaneous []  - External ear exam 0 []  - 0 Specimen Collection (cultures, biopsies, blood, body fluids, etc.) []  - 0 Specimen(s) / Culture(s) sent or taken to Lab for analysis []  - 0 Patient Transfer (multiple staff / / Similar devices) []  - 0 Simple Staple / Suture removal (25 or less) []  - 0 Complex Staple / Suture removal (26 or more) []  - 0 Hypo / Hyperglycemic Management (close monitor of Blood Glucose) []  - 0 Ankle / Brachial Index (ABI) - do not check if billed separately X- 1 5 Vital Signs Has the patient been seen at the hospital within the last three years: Yes Total Score: 75 Level Of Care: New/Established - Level 2 Electronic Signature(s) Signed: 08/22/2022 4:19:23 PM By: RN Entered By: on 08/19/2022 14:30:01 [name], [name] ( ) -------------------------------------------------------------------------------- Lower Extremity Assessment Details Patient Name: [name], [name] Date of Service: 08/19/2022 2:00 PM Medical Record Number: Patient Account Number: Date of Birth/Sex: 11-Dec-1961 (61 y.o. F) Treating RN: Nurse, adult Primary Care Tanicka Bisaillon: Other Clinician: Referring Lovel Suazo: Card, John Treating Legacy Carrender/Extender: in Treatment: 44 Electronic Signature(s) Signed: 08/22/2022 4:19:23 PM By: RN Entered By: 08/24/2022 on 08/19/2022 14:03:55 Fok, Afsheen CYevonne Pax (08/21/2022) -------------------------------------------------------------------------------- Multi Wound Chart Details Patient Name: [name], [name] Date of Service: 08/19/2022 2:00 PM Medical Record Number: 08/21/2022 Patient Account Number: [id number] Date of Birth/Sex: 1961/08/24 (61 y.o. F) Treating RN: 64 Primary Care Yoana Staib: Yevonne Pax Other Clinician: Referring Lorae Roig: Card, John Treating Amun Stemm/Extender: [name] in Treatment: 53 Vital Signs Height(in): 58 Pulse(bpm): 87 Weight(lbs): 275 Blood Pressure(mmHg): 138/70 Body Mass Index(BMI): 57.5 Temperature(F): 98.2 Respiratory Rate(breaths/min): 18 Photos: [N/A:N/A] Wound Location: Distal, Midline Back N/A N/A Wounding Event: Surgical Injury N/A N/A Primary Etiology: Dehisced Wound N/A N/A Comorbid History: Hypertension N/A N/A Date Acquired: 04/11/2021 N/A N/A Weeks of Treatment: 44 N/A N/A Wound Status: Open N/A N/A Wound Recurrence: No N/A N/A Measurements L x W x D (cm) 0.3x0.3x2.2 N/A N/A Area (cm) : 0.071 N/A N/A Volume (cm) : 0.156 N/A N/A % Reduction in Area: 43.70% N/A N/A % Reduction in Volume: 73.00% N/A N/A Classification: Full Thickness Without Exposed N/A N/A Support Structures Exudate Amount: Medium N/A N/A Exudate Type: Serosanguineous N/A N/A Exudate Color: red, brown N/A N/A Granulation Amount: Large (67-100%) N/A N/A Granulation Quality: Red N/A N/A Necrotic Amount: None Present (0%) N/A N/A Exposed Structures: Fat Layer (Subcutaneous Tissue): N/A N/A Yes Fascia: No Tendon: No Muscle: No Joint: No Bone: No Epithelialization: None N/A N/A Treatment Notes Electronic Signature(s) Signed: 08/22/2022 4:19:23  PM By: Yevonne Pax RN Entered By: 08/21/2022 on 08/19/2022 14:04:22 Parmelee, Yaneli [id number] (08/21/2022) -------------------------------------------------------------------------------- Multi-Disciplinary Care Plan Details Patient Name: [name], [name] Date of Service: 08/19/2022 2:00 PM Medical Record Number: [id number] Patient Account Number: 09/11/1961 Date of Birth/Sex: 10/10/1961 (61 y.o. F) Treating RN:  Yevonne Pax Primary Care Stacey Sago: [name] Other Clinician: Referring Yuma Blucher: Card, John Treating Christy Friede/Extender: Rowan Blase in Treatment: 63 Active Inactive Wound/Skin Impairment Nursing Diagnoses: Knowledge deficit related to ulceration/compromised skin integrity Goals: Patient/caregiver will verbalize understanding of skin care regimen Date Initiated: 10/15/2021 Target Resolution Date: 08/09/2022 Goal Status: Active Ulcer/skin breakdown will have a volume reduction of 30% by week 4 Date Initiated: 10/15/2021 Date Inactivated: 01/29/2022 Target Resolution Date: 12/15/2021 Goal Status: Unmet Unmet Reason: comorbities Ulcer/skin breakdown will have a volume reduction of 50% by week 8 Date Initiated: 10/15/2021 Date Inactivated: 01/29/2022 Target Resolution Date: 01/15/2022 Goal Status: Unmet Unmet Reason: comorbities Ulcer/skin breakdown will have a volume reduction of 80% by week 12 Date Initiated: 10/15/2021 Date Inactivated: 02/19/2022 Target Resolution Date: 02/15/2022 Goal Status: Unmet Unmet Reason: comorbities Ulcer/skin breakdown will heal within 14 weeks Date Initiated: 10/15/2021 Date Inactivated: 05/07/2022 Target Resolution Date: 03/15/2022 Goal Status: Unmet Unmet Reason: comorbities Interventions: Assess patient/caregiver ability to obtain necessary supplies Assess patient/caregiver ability to perform ulcer/skin care regimen upon admission and as needed Assess ulceration(s) every visit Notes: Electronic Signature(s) Signed: 08/22/2022  4:19:23 PM By: Yevonne Pax RN Entered By: Yevonne Pax on 08/19/2022 14:04:10 [name], [name] ([id number]) -------------------------------------------------------------------------------- Pain Assessment Details Patient Name: [name], [name] Date of Service: 08/19/2022 2:00 PM Medical Record Number: [id number] Patient Account Number: [id number] Date of Birth/Sex: [birth year] (61 y.o. F) Treating RN: Yevonne Pax Primary Care Brendalee Matthies: [name] Other Clinician: Referring Alfrieda Tarry: Card, John Treating Kalijah Westfall/Extender: Rowan Blase in Treatment: 58 Active Problems Location of Pain Severity and Description of Pain Patient Has Paino No Site Locations Pain Management and Medication Current Pain Management: Electronic Signature(s) Signed: 08/22/2022 4:19:23 PM By: Yevonne Pax RN Entered By: Yevonne Pax on 08/19/2022 14:03:14 [name], [name] ([id number]) -------------------------------------------------------------------------------- Wound Assessment Details Patient Name: [name], [name] Date of Service: 08/19/2022 2:00 PM Medical Record Number: [id number] Patient Account Number: [id number] Date of Birth/Sex: 22-Oct-1961 (61 y.o. F) Treating RN: Yevonne Pax Primary Care Catlin Aycock: Card, Jonny Ruiz Other Clinician: Referring Jolan Upchurch: Card, John Treating Marygrace Sandoval/Extender: Rowan Blase in Treatment: 44 Wound Status Wound Number: 1 Primary Etiology: Dehisced Wound Wound Location: Distal, Midline Back Wound Status: Open Wounding Event: Surgical Injury Comorbid History: Hypertension Date Acquired: 04/11/2021 Weeks Of Treatment: 44 Clustered Wound: No Photos Wound Measurements Length: (cm) 0.3 Width: (cm) 0.3 Depth: (cm) 2.5 Area: (cm) 0.071 Volume: (cm) 0.177 % Reduction in Area: 43.7% % Reduction in Volume: 69.4% Epithelialization: None Tunneling: No Undermining: Yes Starting Position (o'clock): 9 Ending Position (o'clock): 3 Maximum Distance: (cm)  3.2 Wound Description Classification: Full Thickness Without Exposed Support Structu Exudate Amount: Medium Exudate Type: Serosanguineous Exudate Color: red, brown res Foul Odor After Cleansing: No Slough/Fibrino No Wound Bed Granulation Amount: Large (67-100%) Exposed Structure Granulation Quality: Red Fascia Exposed: No Necrotic Amount: None Present (0%) Fat Layer (Subcutaneous Tissue) Exposed: Yes Tendon Exposed: No Muscle Exposed: No Joint Exposed: No Bone Exposed: No Electronic Signature(s) Signed: 09/16/2022 2:29:15 PM By: Yevonne Pax RN Previous Signature: 08/19/2022 3:06:44 PM Version By: Yevonne Pax RN Entered By: Yevonne Pax on 09/16/2022 14:29:14 [name], [name] ([id number]) -------------------------------------------------------------------------------- Vitals Details Patient Name: [name], Merita C. Date of Service: 08/19/2022 2:00 PM Medical Record Number: [id number] Patient Account Number: [id number] Date of Birth/Sex: 04-19-1961 (61 y.o. F) Treating RN: Yevonne Pax Primary Care Jadamarie Butson: [name] Other Clinician: Referring Arshdeep Bolger: Card, John Treating Andren Bethea/Extender: Rowan Blase in Treatment: 50 Vital Signs Time Taken: 14:02 Temperature (F): 98.2 Height (in): 58 Pulse (bpm): 87  Weight (lbs): 275 Respiratory Rate (breaths/min): 18 Body Mass Index (BMI): 57.5 Blood Pressure (mmHg): 138/70 Reference Range: 80 - 120 mg / dl Electronic Signature(s) Signed: 08/22/2022 4:19:23 PM By: Yevonne Pax RN Entered By: Yevonne Pax on 08/19/2022 14:03:01

## 2022-08-23 ENCOUNTER — Encounter: Payer: 59 | Admitting: Occupational Therapy

## 2022-08-26 ENCOUNTER — Encounter: Payer: 59 | Admitting: Physician Assistant

## 2022-08-26 DIAGNOSIS — L98422 Non-pressure chronic ulcer of back with fat layer exposed: Secondary | ICD-10-CM | POA: Diagnosis not present

## 2022-08-26 NOTE — Progress Notes (Addendum)
ORISSA, ARREAGA ([id number]) Visit Report for 08/26/2022 Arrival Information Details Patient Name: [name] [name] [name] [name]. Date of Service: 08/26/2022 2:00 PM Medical Record Number: [id number] Patient Account Number: [id number] Date of Birth/Sex: [name] (61 y.o. F) Treating RN: Yevonne Pax Primary Care [name] [name]: Card, [name] [name] Other Clinician: Referring [name] [name]: Card, [name] [name]/Extender: [name] [name] in Treatment: 45 Visit Information History Since Last Visit All ordered tests and consults were completed: No Patient Arrived: [name] [name] Added or deleted any medications: No Arrival Time: 14:07 Any new allergies or adverse reactions: No Accompanied By: self Had a fall or experienced change in No Transfer Assistance: None activities of daily living that may affect Patient Identification Verified: Yes risk of falls: Secondary Verification Process Completed: Yes Signs or symptoms of abuse/neglect since last visito No Patient Requires Transmission-Based Precautions: No Hospitalized since last visit: No Patient Has Alerts: No Implantable device outside of the clinic excluding No cellular tissue based products placed in the center since last visit: Has Dressing in Place as Prescribed: Yes Pain Present Now: Yes Electronic Signature(s) Signed: 08/26/2022 4:28:34 PM By: Yevonne Pax RN Entered By: Yevonne Pax on 08/26/2022 14:13:19 [name] [name] C. ([id number]) -------------------------------------------------------------------------------- Clinic Level of Care Assessment Details Patient Name: [name] [name] C. Date of Service: 08/26/2022 2:00 PM Medical Record Number: [id number] Patient Account Number: [id number] Date of Birth/Sex: [name] [name] (61 y.o. F) Treating RN: Yevonne Pax Primary Care [name] [name]: Card, [name] [name] Other Clinician: Referring [name] [name]: Card, [name] Treating [name] [name]/Extender: [name] [name] in Treatment: 45 Clinic Level of Care Assessment Items TOOL 4  Quantity Score X - Use when only an EandM is performed on FOLLOW-UP visit 1 0 ASSESSMENTS - Nursing Assessment / Reassessment X - Reassessment of Co-morbidities (includes updates in patient status) 1 10 X- 1 5 Reassessment of Adherence to Treatment Plan ASSESSMENTS - Wound and Skin Assessment / Reassessment X - Simple Wound Assessment / Reassessment - one wound 1 5 []  - 0 Complex Wound Assessment / Reassessment - multiple wounds []  - 0 Dermatologic / Skin Assessment (not related to wound area) ASSESSMENTS - Focused Assessment []  - Circumferential Edema Measurements - multi extremities 0 []  - 0 Nutritional Assessment / Counseling / Intervention []  - 0 Lower Extremity Assessment (monofilament, tuning fork, pulses) []  - 0 Peripheral Arterial Disease Assessment (using hand held doppler) ASSESSMENTS - Ostomy and/or Continence Assessment and Care []  - Incontinence Assessment and Management 0 []  - 0 Ostomy Care Assessment and Management (repouching, etc.) PROCESS - Coordination of Care X - Simple Patient / Family Education for ongoing care 1 15 []  - 0 Complex (extensive) Patient / Family Education for ongoing care []  - 0 Staff obtains , Records, Test Results / Process Orders []  - 0 Staff telephones HHA, Nursing Homes / Clarify orders / etc []  - 0 Routine Transfer to another Facility (non-emergent condition) []  - 0 Routine Hospital Admission (non-emergent condition) []  - 0 New Admissions / / Ordering NPWT, Apligraf, etc. []  - 0 Emergency Hospital Admission (emergent condition) X- 1 10 Simple Discharge Coordination []  - 0 Complex (extensive) Discharge Coordination PROCESS - Special Needs []  - Pediatric / Minor Patient Management 0 []  - 0 Isolation Patient Management []  - 0 Hearing / Language / Visual special needs []  - 0 Assessment of Community assistance (transportation, D/C planning, etc.) []  - 0 Additional assistance / Altered  mentation []  - 0 Support Surface(s) Assessment (bed, cushion, seat, etc.) INTERVENTIONS - Wound Cleansing / Measurement Rill, Eun C. ( ) X- 1 5 Simple Wound Cleansing -  one wound []  - 0 Complex Wound Cleansing - multiple wounds X- 1 5 Wound Imaging (photographs - any number of wounds) []  - 0 Wound Tracing (instead of photographs) X- 1 5 Simple Wound Measurement - one wound []  - 0 Complex Wound Measurement - multiple wounds INTERVENTIONS - Wound Dressings X - Small Wound Dressing one or multiple wounds 1 10 []  - 0 Medium Wound Dressing one or multiple wounds []  - 0 Large Wound Dressing one or multiple wounds []  - 0 Application of Medications - topical []  - 0 Application of Medications - injection INTERVENTIONS - Miscellaneous []  - External ear exam 0 []  - 0 Specimen Collection (cultures, biopsies, blood, body fluids, etc.) []  - 0 Specimen(s) / Culture(s) sent or taken to Lab for analysis []  - 0 Patient Transfer (multiple staff / / Similar devices) []  - 0 Simple Staple / Suture removal (25 or less) []  - 0 Complex Staple / Suture removal (26 or more) []  - 0 Hypo / Hyperglycemic Management (close monitor of Blood Glucose) []  - 0 Ankle / Brachial Index (ABI) - do not check if billed separately X- 1 5 Vital Signs Has the patient been seen at the hospital within the last three years: Yes Total Score: 75 Level Of Care: New/Established - Level 2 Electronic Signature(s) Signed: 08/29/2022 8:44:03 PM By: RN Entered By: on 08/27/2022 08:33:49 [name] [name] C ( ) -------------------------------------------------------------------------------- Encounter Discharge Information Details Patient Name: [name], [name] Date of Service: 08/26/2022 2:00 PM Medical Record Number: Patient Account Number: Date of Birth/Sex: [name] (61 y.o. F) Treating RN: Primary Care Claron Rosencrans:  Other Clinician: Referring Lalah Durango: Card, [name] Treating [name] [name]/Extender: in Treatment: 57 Encounter Discharge Information Items Discharge Condition: Stable Ambulatory Status: Walker Discharge Destination: Home Transportation: Private Auto Accompanied By: self Schedule Follow-up Appointment: Yes Clinical Summary of Care: Electronic Signature(s) Signed: 08/27/2022 8:35:01 AM By: Yevonne Pax RN Entered By: Yevonne Pax on 08/27/2022 08:35:01 Lupinacci, Alley C. (Marland Kitchen) -------------------------------------------------------------------------------- Lower Extremity Assessment Details Patient Name: Posthumus, Natina C. Date of Service: 08/26/2022 2:00 PM Medical Record Number: 08/28/2022 Patient Account Number: [id number] Date of Birth/Sex: [name] (61 y.o. F) Treating RN: 64 Primary Care Jordis Repetto: Yevonne Pax Other Clinician: Referring Kaiah Hosea: Card, [name] Treating Tonantzin Mimnaugh/Extender: [name] in Treatment: 45 Electronic Signature(s) Signed: 08/26/2022 4:28:34 PM By: 54 RN Entered By: 08/29/2022 on 08/26/2022 14:14:58 Fayson, Kashara CYevonne Pax (08/29/2022) -------------------------------------------------------------------------------- Multi Wound Chart Details Patient Name: [name] [name] C. Date of Service: 08/26/2022 2:00 PM Medical Record Number: 08/28/2022 Patient Account Number: [id number] Date of Birth/Sex: [name] (61 y.o. F) Treating RN: 64 Primary Care Ether Goebel: Yevonne Pax Other Clinician: Referring Hank Walling: Card, [name] Treating Belen Zwahlen/Extender: [name] in Treatment: 45 Vital Signs Height(in): 58 Pulse(bpm): 74 Weight(lbs): 275 Blood Pressure(mmHg): 175/98 Body Mass Index(BMI): 57.5 Temperature(F): 98.3 Respiratory Rate(breaths/min): 18 Photos: [1:No Photos] [N/A:N/A] Wound Location: [1:Distal, Midline Back] [N/A:N/A] Wounding Event: [1:Surgical Injury] [N/A:N/A] Primary  Etiology: [1:Dehisced Wound] [N/A:N/A] Comorbid History: [1:Hypertension] [N/A:N/A] Date Acquired: [1:04/11/2021] [N/A:N/A] Weeks of Treatment: [1:45] [N/A:N/A] Wound Status: [1:Open] [N/A:N/A] Wound Recurrence: [1:No] [N/A:N/A] Measurements L x W x D (cm) [1:0.3x0.3x2.5] [N/A:N/A] Area (cm) : [1:0.071] [N/A:N/A] Volume (cm) : [1:0.177] [N/A:N/A] % Reduction in Area: [1:43.70%] [N/A:N/A] % Reduction in Volume: [1:69.40%] [N/A:N/A] Classification: [1:Full Thickness Without Exposed Support Structures] [N/A:N/A] Exudate Amount: [1:Medium] [N/A:N/A] Exudate Type: [1:Serosanguineous] [N/A:N/A] Exudate Color: [1:red, brown] [N/A:N/A] Granulation Amount: [1:Large (67-100%)] [N/A:N/A] Granulation Quality: [1:Red] [N/A:N/A] Necrotic  Amount: [1:None Present (0%)] [N/A:N/A] Exposed Structures: [1:Fat Layer (Subcutaneous Tissue): Yes Fascia: No Tendon: No Muscle: No Joint: No Bone: No None] [N/A:N/A N/A] Treatment Notes Electronic Signature(s) Signed: 08/26/2022 4:28:34 PM By: Yevonne Pax RN Entered By: Yevonne Pax on 08/26/2022 14:15:08 [name] [name] Salena Saner ([id number]) -------------------------------------------------------------------------------- Multi-Disciplinary Care Plan Details Patient Name: [name] [name] C. Date of Service: 08/26/2022 2:00 PM Medical Record Number: [id number] Patient Account Number: [id number] Date of Birth/Sex: [birth year] (61 y.o. F) Treating RN: Yevonne Pax Primary Care Cedrick Partain: [name] Other Clinician: Referring Gwendolyne Welford: Card, [name] Treating Joyice Magda/Extender: [name] [name] in Treatment: 45 Active Inactive Wound/Skin Impairment Nursing Diagnoses: Knowledge deficit related to ulceration/compromised skin integrity Goals: Patient/caregiver will verbalize understanding of skin care regimen Date Initiated: 10/15/2021 Target Resolution Date: 08/09/2022 Goal Status: Active Ulcer/skin breakdown will have a volume reduction of 30% by week 4 Date  Initiated: 10/15/2021 Date Inactivated: 01/29/2022 Target Resolution Date: 12/15/2021 Goal Status: Unmet Unmet Reason: comorbities Ulcer/skin breakdown will have a volume reduction of 50% by week 8 Date Initiated: 10/15/2021 Date Inactivated: 01/29/2022 Target Resolution Date: 01/15/2022 Goal Status: Unmet Unmet Reason: comorbities Ulcer/skin breakdown will have a volume reduction of 80% by week 12 Date Initiated: 10/15/2021 Date Inactivated: 02/19/2022 Target Resolution Date: 02/15/2022 Goal Status: Unmet Unmet Reason: comorbities Ulcer/skin breakdown will heal within 14 weeks Date Initiated: 10/15/2021 Date Inactivated: 05/07/2022 Target Resolution Date: 03/15/2022 Goal Status: Unmet Unmet Reason: comorbities Interventions: Assess patient/caregiver ability to obtain necessary supplies Assess patient/caregiver ability to perform ulcer/skin care regimen upon admission and as needed Assess ulceration(s) every visit Notes: Electronic Signature(s) Signed: 08/26/2022 4:28:34 PM By: Yevonne Pax RN Entered By: Yevonne Pax on 08/26/2022 14:15:02 [name] [name] C. ([id number]) -------------------------------------------------------------------------------- Pain Assessment Details Patient Name: [name] [name] C. Date of Service: 08/26/2022 2:00 PM Medical Record Number: [id number] Patient Account Number: [id number] Date of Birth/Sex: [birth year] (61 y.o. F) Treating RN: Yevonne Pax Primary Care Margeaux Swantek: [name] Other Clinician: Referring Tenesha Garza: Card, [name] Treating Mahala Rommel/Extender: [name] [name] in Treatment: 45 Active Problems Location of Pain Severity and Description of Pain Patient Has Paino No Site Locations Pain Management and Medication Current Pain Management: Electronic Signature(s) Signed: 08/26/2022 4:28:34 PM By: Yevonne Pax RN Entered By: Yevonne Pax on 08/26/2022 14:14:14 Tickner, Voncille Salena Saner  ([id number]) -------------------------------------------------------------------------------- Patient/Caregiver Education Details Patient Name: [name] [name] C. Date of Service: 08/26/2022 2:00 PM Medical Record Number: [id number] Patient Account Number: [id number] Date of Birth/Gender: [name] (61 y.o. F) Treating RN: Yevonne Pax Primary Care Physician: [name] Other Clinician: Referring Physician: Card, [name] Treating Physician/Extender: [name] [name] in Treatment: 43 Education Assessment Education Provided To: Patient Education Topics Provided Wound/Skin Impairment: Methods: Explain/Verbal Responses: State content correctly Electronic Signature(s) Signed: 08/29/2022 8:44:03 PM By: Yevonne Pax RN Entered By: Yevonne Pax on 08/27/2022 08:34:21 [name] [name] C. ([id number]) -------------------------------------------------------------------------------- Wound Assessment Details Patient Name: [name] [name] C. Date of Service: 08/26/2022 2:00 PM Medical Record Number: [id number] Patient Account Number: [id number] Date of Birth/Sex: [birth year] (61 y.o. F) Treating RN: Yevonne Pax Primary Care Dijon Cosens: Card, [name] [name] Other Clinician: Referring Quintin Hjort: Card, [name] Treating Damico Partin/Extender: [name] [name] in Treatment: 45 Wound Status Wound Number: 1 Primary Etiology: Dehisced Wound Wound Location: Distal, Midline Back Wound Status: Open Wounding Event: Surgical Injury Comorbid History: Hypertension Date Acquired: 04/11/2021 Weeks Of Treatment: 45 Clustered Wound: No Wound Measurements Length: (cm) 0.3 Width: (cm) 0.3 Depth: (cm) 2.5 Area: (cm) 0.071 Volume: (cm) 0.177 % Reduction in Area: 43.7% % Reduction in Volume: 69.4% Epithelialization: None Tunneling: No Undermining: Yes Starting Position (  o'clock): 9 Ending Position (o'clock): 3 Maximum Distance: (cm) 3.2 Wound Description Classification: Full Thickness Without Exposed Support  Structu Exudate Amount: Medium Exudate Type: Serosanguineous Exudate Color: red, brown res Foul Odor After Cleansing: No Slough/Fibrino No Wound Bed Granulation Amount: Large (67-100%) Exposed Structure Granulation Quality: Red Fascia Exposed: No Necrotic Amount: None Present (0%) Fat Layer (Subcutaneous Tissue) Exposed: Yes Tendon Exposed: No Muscle Exposed: No Joint Exposed: No Bone Exposed: No Electronic Signature(s) Signed: 09/16/2022 2:28:54 PM By: Yevonne Pax RN Previous Signature: 09/16/2022 2:24:48 PM Version By: Yevonne Pax RN Previous Signature: 08/26/2022 4:28:34 PM Version By: Yevonne Pax RN Entered By: Yevonne Pax on 09/16/2022 14:28:54 Coller, [name] [name] C. ([id number]) -------------------------------------------------------------------------------- Vitals Details Patient Name: Briones, Kjersti C. Date of Service: 08/26/2022 2:00 PM Medical Record Number: [id number] Patient Account Number: [id number] Date of Birth/Sex: [name] (61 y.o. F) Treating RN: Yevonne Pax Primary Care Sanuel Ladnier: [name] Other Clinician: Referring Monroe Toure: Card, [name] Treating Kymberly Blomberg/Extender: [name] [name] in Treatment: 45 Vital Signs Time Taken: 14:13 Temperature (F): 98.3 Height (in): 58 Pulse (bpm): 74 Weight (lbs): 275 Respiratory Rate (breaths/min): 18 Body Mass Index (BMI): 57.5 Blood Pressure (mmHg): 175/98 Reference Range: 80 - 120 mg / dl Electronic Signature(s) Signed: 08/26/2022 4:28:34 PM By: Yevonne Pax RN Entered By: Yevonne Pax on 08/26/2022 14:14:05

## 2022-08-26 NOTE — Progress Notes (Addendum)
[name], [name] ([id number]) Visit Report for 08/26/2022 Chief Complaint Document Details Patient Name: [name], [name] Date of Service: 08/26/2022 2:00 PM Medical Record Number: [id number] Patient Account Number: [id number] Date of Birth/Sex: 1961-06-26 (61 y.o. F) Treating RN: Carlene Coria Primary Care Provider: Clayborn Heron Other Clinician: Referring Provider: Card, John Treating Provider/Extender: Skipper Cliche in Treatment: 76 Information Obtained from: Patient Chief Complaint Surgical Back Ulcer Electronic Signature(s) Signed: 08/26/2022 2:03:41 PM By: Worthy Keeler PA-C Entered By: Worthy Keeler on 08/26/2022 14:03:40 [name], [name] ([id number]) -------------------------------------------------------------------------------- HPI Details Patient Name: [name], [name] Date of Service: 08/26/2022 2:00 PM Medical Record Number: [id number] Patient Account Number: [id number] Date of Birth/Sex: Sep 11, 1961 (61 y.o. 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 This was actually broken up into 3 separate surgical events. The initial surgical intervention actually was on November 05, 2021 almost a year ago. Subsequently the patient went back in February for a seroma of the area which unfortunately required her to have a repeat surgery to go in and clean this out. And then again this occurred in April where she went back in and again they felt like stitches were coming out and there was an additional seroma. She was placed in a wound VAC initially and then subsequently as it got smaller that  was discontinued. Again right now I will see anything that I think a wound VAC would help with. Nonetheless she definitely has a significant depth to the wound that is going require packing. I actually believe the Hydrofera Blue rope would probably do quite well with this the problem is as much as it is draining she probably needs this to be changed at least every day. She does not really have anyone that can help with that that is the complicating scenario here. With that being said the patient does have a history of multiple sclerosis, hypertension, and again this surgical wound dehiscence in regard to her lumbar spine region. She did have a repeat MRI which was actually completed 10/09/2021. This showed that there was no significant change in the subcutaneous fluid collection/track of the lower lumbar region. This is extending to the level of the fascia unfortunately. This seems to go all the way from the L2 level with a track extending all the way to the fascia at the L4-5 level. Again this is a significant wound and there is significant drainage but does not seem to communicate to the spinal region as far as spinal fluid or otherwise is concerned that is good news. Nonetheless she last saw Dr. Christella Noa who is her neurosurgeon on 10/01/2021 that was when he ordered this last MRI she supposed to see him next week as well. With that being said he did not feel like there was any significant issue there but was not sure why this was not healing that is when he ordered the MRI. They were wanting to make sure that this was packed appropriately by home health unfortunately the main issue currently is that home health is completely out of the picture as the patient has exhausted all the home health that that she gets for a year. She is now in a very difficult  predicament where she does not have anyone to help her change the dressing and to be honest that she is not able to do it herself with the location of  the wound being on the midline lumbar spine region. If she does not have anyone that can help it is probably can to be necessary for her to go to a facility for rehab and daily dressing changes as I feel like daily changes which is much drainage that she is having is going to be necessary. 10/23/2021 upon evaluation today patient appears to be doing decently well in regard to her back ulcer. This does seem to be draining a lot less than what it is been doing in the past. With that being said she still has quite a bit of drainage nonetheless. I do think that given time this should improve least I hope so. The good news is she does have home health coming out 3 days a week were doing it 2 days a week and she is paying someone we can to help. 10/30/2021 upon evaluation today patient appears to be doing okay in regard to her back ulcer this is not draining quite as bad as it was in the beginning but he still has quite a bit of drainage noted. I do believe that the patient would benefit from [place] going ahead forward with attempting a wound VAC using the Hydrofera Blue rope to pack with and then subsequently using the VAC externally to actually suction out and help this to fill- in. I think this is our ideal way to try to get things cleared at this point. As it stands I am not certain that we are really making a progress that we want to see near with doing it in the way we are which is packing with the rope. It is a good dressing but I do think it is insufficient for total healing. She just seems to have too much in the way of drainage at this point unfortunately. 11/06/2021 upon evaluation today patient unfortunately continues to have issues with her back ongoing. The good news is her MRI that was repeated showed signs of the size of this area in the lumbar spine region having decreased from 4 cm to 3.5 cm this is definitely not bad news at all. With that being said unfortunately she continues to have issues  with ongoing drainage not as severe as in the beginning but nonetheless still significant. I do think a wound VAC still would be a good way to go although her home health agency nurse apparently has some concerns about the possibility of not being able to keep a seal with this as they had struggles in the past. Nonetheless I explained to the patient that this is much different than what she had previous and that I really feel like it would do much better as far as getting the area taken care of without having any complications or issues here. I think that we should be able to maintain a seal. Nonetheless at this time I did discuss with the patient as well that she probably does need to have a wound VAC in order for [place] to get this moving in the right direction. 11/13/2021 upon evaluation patient's wound bed actually showed signs of significant drainage at this time. She did see the surgeon yesterday he did not see anything that appeared to be infected. Nonetheless he does appear that she is continuing to have areas here that just do not seem  to want to seal up there MRI findings have been negative but nonetheless she continues to have is the seroma that is filling in. I do feel like we need to try to widen the hole so we can get at least a half of the [facility] then this will be better than nothing at this point. 11/20/2021 upon evaluation today patient actually appears to be having less pain at this point which is good news and overall she we still do not have the results of the culture back yet it had to be sent out to [facility] and we do not have the result back yet. Is doing decently well in regard to her wound. Fortunately there does not appear to be any signs of active infection systemically nor locally at this time. 11/27/2021 upon evaluation today patient appears to be doing well with regard to her wound all things considered there does appear to be less drainage than there was previous.  Fortunately I do not see any evidence of worsening of the patient is stating that she is having some issues with back pain. This is somewhat new. Again this I think could be related to the fact that she is having some issues here with infection. We are still waiting to see what the result of her culture shows from susceptibility testing Enterococcus has been identified but we do not know if this is VRE or not. 12/04/2021 upon evaluation today patient appears to be doing well with regard to her wound all things considered. I did have a conversation with Erin from Dr. Sueanne Margarita office. Of note she notes that Dr. Drinda Butts really does not want to do anything surgical right now which I completely understand. With that being said I am still leery of how far we will make it getting this to heal short of any type of surgery to open this up and allow [place] to more appropriately packed the wound. Nonetheless I will absolutely give it our best shot as far as that is concerned. I discussed that with the patient today. She voiced understanding. The good news is the drainage today seems to be clear it is no longer cloudy as it was previous I am actually very pleased in that regard. [name], [name] ([id number]) 12/11/2021 since have last seen the patient actually did have an conversation with Dr. Franky Macho with her neurosurgeon. Again this involve the discussion around whether or not to open the wound and try to apply a wound VAC following. With that being said the decision was made that that may be the best thing to do if the patient was in agreement. Nonetheless I am actually extremely encouraged with what I am seeing today much more than I would have thought. In fact I think that we may be over packing the wound which is why she is having some discomfort at this point as there is much less of the dressing able to get and this time compared to what we saw previous. That is actually really good news. In fact the 6:00  tunnel appears to have filled in is awesome news. At the 12:00 location I am actually able to pack into that area pretty effectively at this point today. In fact I was able to get less of the packing in which I will detail below. 12/18/2021 upon evaluation today patient appears to be doing well with regard to her wound on the back. Fortunately there is no signs of active infection which is great news and overall  very pleased with where things stand today. No fevers, chills, nausea, vomiting, or diarrhea. 01/01/2022 upon evaluation today patient appears to be doing decently well in regard to her wound. Fortunately there does not appear to be any signs of anything worsening which is great news. She still continues to have issues with drainage but this is not nearly as significant as what it was in the past. There is all clear drainage no signs of purulence noted. 01/08/2022 upon evaluation today patient appears to be doing well with regard to her wound. With that being said she tells me that she has been having some increased pain over the past week. It does appear based on the amount of Hydrofera Blue that we removed this is probably over pack. Again it is difficult to know exactly how old the nurses getting all the skin but nonetheless the length of Hydrofera Blue that was utilized was way too much which may be part of the reason why this felt so uncomfortable. Fortunately I think that we can cut back on that we discussed pieces smaller so hopefully that will not be over packed. 01/22/2021 upon evaluation today patient appears to be doing well With regard to there being no signs of infection at this time. The wound does still tunnel mainly up at the 12:00 location. The depth of the tunnel complete from entry point to the base is about 3.5 cm today. With that being said I do believe that overall she is making good progress this is just a very slow process for her which I know has been frustrating as  well. 01/29/2022 upon evaluation today patient appears to be doing well with regard to the wound on her lower back and the lumbar spine region. The good news is the depth that I got this week was a little bit less going up at the 12:00 location as compared to last week. I measured this to be 3.5 cm last week and it was 3.3 cm this week. Again that is a minute shift but nonetheless a good shift in the right direction. Overall I think that we are on the right track. 02/05/2022 upon evaluation today patient appears to be doing well with regard to her wound. In fact right now her measurements are somewhere around 2.9 cm which is definitely smaller and less deep than last week At the 12:00 location. Overall I am very pleased with what we are seeing. 02/19/2022 upon evaluation today patient unfortunately is noting that the wound is actually closing up externally but internally there is still some depth to this. I discussed with her today that we cannot let it close up like this is can end up being a significant issue for her if we allow for that. Subsequently we need to do what we can to try to open this up and keep it open more effectively. She voiced understanding. With that being said we are going to proceed with that procedure today in order to debride away some of the skin on externally that is trying to close and on this. 02/26/2022 upon evaluation patient appears to be doing better in my opinion after we had to open up this area last week. Again she has a significant amount of healing and overall I am extremely pleased with where things stand currently. I do not see any evidence of active infection locally nor systemically at this time which is great news and in general I think that we are on the right track for getting  this hopefully closed final and done. 03/05/2022 upon evaluation today patient unfortunately is doing a little bit worse with regard to the whole size this is starting to get much  smaller unfortunately. There does not appear to be any signs of active infection locally nor systemically which is great news. With that being said I am concerned about the fact that the patient is doing quite a bit worse with regard to trapping some fluid and almost feels like she may have a fluid pocket that not only goes in and up towards 12:00 but looks back around towards the more superficial subcutaneous tissue. I am very concerned that this is going to be something that is very hard for [place] to heal short of another surgery to open this up and try to wound VAC from the inside out. Even then there is no guarantees this is just a very difficult situation to be perfectly honest. 03/14/2022 upon evaluation today patient appears to be doing well with regard to her wound as far as the area staying open and pleased in that regard. With that being said unfortunately she is not doing as well when it comes to the irritation around it appears to be very inflamed and red this is not good that was discussed with her today as well. Subsequently I do believe that working to need to do what we can do to try and calm this down in the past she did well with the Augmentin due to Enterococcus infection I am not sure if were dealing with the same thing or not but at the same time I think that is probably to be my go to at this point. 03/19/2022 upon evaluation today patient appears to be doing really about the same in regard to her wound that the pain is better. 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 She seems to be improved dramatically with the antibiotic currently although it does not cover for MRSA I am apt to just see how this progresses over the next several days to a week. With that being said the bigger issue here is that we simply do not seem to be making excellent progress and I am concerned about the lack of progress. I discussed with Dr. Shon Baton with [facility] in [place]  and to be honest his recommendation was that the best bet would probably be to get her to a teaching center. She is actually been seen for rheumatology and a I believe psychologist/psychiatrist at [facility]. She would prefer to go that direction as opposed to [facility] or [facility]. I am definitely okay with that. 03/26/2022 upon evaluation today patient appears to be doing okay in regard to her wound. She is not showing signs of worsening and overall she tells me that her pain is not nearly as bad as it was previous. Fortunately I do not see any evidence of active infection locally or systemically which is great news. No fevers, chills, nausea, vomiting, or diarrhea. 04-02-2022 upon evaluation today patient appears to be doing well with regard to her wound. It does not appear to be showing any signs of worsening which is great news. With that being said you are still having some issues here with drainage although it is clear and mainly just tenderness with bleeding from the [facility] I do think that treating for the second issue which is MRSA is probably the right thing to do at this point she is now off of the Augmentin. 04-09-2022 upon  evaluation today patient's wound actually appears to be doing about the same. Fortunately I do not see any evidence of active infection locally or systemically at this time which is great news. No fevers, chills, nausea, vomiting, or diarrhea. 04-16-2022 upon evaluation today patient appears to be doing well with regard to her wound in the lumbar spine region. Subsequently she continues to have an area that does have a tunnel up into the 12:00 location. This is about 3.5 cm using a skinny probe curved upwards to get into this region. With that being said I really do not see any signs of overall improvement but also do not see any signs of worsening I feel like work on about a still made here to some degree. The patient did see her neurologist and he has referred her to  neurosurgery at [facility] for second ULYANA, HARDBARGER. ([id number]) opinion we will see what they have to say as well. 04-23-2022 upon evaluation today patient appears to be doing well with regard to her wound all things considered. She has been tolerating the dressing changes without complication. Fortunately I do not see any signs of active infection locally nor systemically which is great news. No fever chills noted 04-30-2022 upon evaluation today patient appears to be doing about the same in regard to her wound. The depth is really not dramatically improved as far as the 12:00 tunnel is concerned. The overall depth and the base of the wound does seem to be a little bit better it does sound like that external wound has closed as far as the opening is concerned we can have to cut this down from a half to a smaller size based on what we are seeing today. 05-07-2022 upon evaluation today patient's wound actually seems to be doing decently well. There is not a major shift here but a slight shift in the depth both straight and as well as in the [place] at 12:00. With that being said I again we will talk about a couple of millimeters but still every little bit can count when there are situations like this to be honest. 05-14-2022 upon evaluation today patient appears to be doing okay in regard to her wound. I do not see any signs of anything worsening. With that being said also do not see getting significantly better unfortunately. There does not appear to be any evidence of infection locally or systemically which is great news. No fevers, chills, nausea, vomiting, or diarrhea. 05-21-2022 upon evaluation today patient actually appears to be doing quite well in regard to her wound from the standpoint of there being no infection. With that being said there does not appear to be any evidence of infection locally nor systemically which is great news. No fevers, chills, nausea, vomiting, or diarrhea. 05-28-2022  upon evaluation today patient's wound actually appears to be doing about the same. I do not see any evidence of active infection locally or systemically which is great news. No fevers, chills, nausea, vomiting, or diarrhea. 06-04-2022 upon evaluation today patient appears to be doing well with regard to her wound. She has been tolerating dressing changes without complication. Fortunately I do not feel like there is any worsening also I do not feel like there is any improvement. I feel like right a solid area here where she has a tunnel that tracks up at 12:00 and then has a fluid pocket at around roughly 1:00 I can push on this and squeeze fluid out. Again I am not exactly sure  what else can be done from my standpoint to improve this. She does have an appointment with Dr. Christella Noa who I do believe is an excellent surgeon and this is the surgeon who performed this surgery initially. Again the biggest issue was following she had a lot of trouble with the wound VAC I do think that we need to try to see if she is can have a wound VAC following surgery what exactly regular need to do to help keep this moving in the right direction for her. Obviously I want her to have the best result possible that she has to go through surgery again to clean this area out. Absolutely willing to help out with getting this to heal. 06-13-2022 upon evaluation today patient appears to be doing well with regard to her wound its not any worse unfortunately it is also not significantly better which is the only issue currently. I do not see any signs of active infection locally or systemically which is great news. No fevers, chills, nausea, vomiting, or diarrhea. She does have her appointment on Monday with her neurosurgeon and we will see you Dr. Christella Noa has to say at that point. 06-18-2022 upon evaluation patient appears to be doing a little worse in regard to her wound only in the respect that has been several days since she has had  this changed in fact it was last changed on Friday. Since that time she tells me that she has kept this in place over the weekend and yesterday she was supposed to see her neurosurgeon yesterday but they had to cancel due to an emergency surgery according to what the patient tells me. Nonetheless she is having some issues here with drainage coming from the wound that is more purulent in nature I think this is something we have been seeing in the [facility] for several weeks but is more obvious being that it was not changed as much during the last week. She unfortunately is a little worried about this but again I think that this is just part of what we have been dealing with all long is more prevalent and obvious due to the length of time since it was changed last. She does have a repeat appointment for I believe July 10 she told me although she is going to see if she can find anything sooner. 06-25-2022 upon evaluation today patient's wound actually showed signs of marginal improvement which is good news. She still has the area of abscess that seems to be at roughly 2:00 when looking at her back. We can get into this with the skinny probe other than that I really do not know what else I can do to try to make this better. We discussed this and she does have an appointment with her neurosurgeon although that is not till mid July as he had to change it at the last moment during the time she was post to see him a week ago. She does have evidence of potential infection on culture I am going to go ahead and treat this in order to ensure that nothing worsens. With that being said I do believe that overall she seems to be doing well but I do think we want to make sure that that the knee gets worse in the meantime until she gets to see her neurosurgeon. 07-01-2022 upon evaluation today patient appears to continue to have trouble with her wound draining. Again we have been continue to monitor this and I still  see  the same issue that I have noted before the patient has an area which is draining seemingly from around the 2:00 location when you go up towards 12:00 and then over towards 2:00 looking at her back this is where there is actually a soft area external that if you push on it you will see fluid come out and subsequently this is also where it tracks to internally. Everything in the 6:00 location has completely healed in and this looks much better in that regard but this upper portion we just cannot get to closed with the methods that we have. It seemed to me that this is probably going to require that this area be open and cleaned out in order to allow it to heal and my suggestion would be that a wound VAC is probably going to be the ideal thing. 07-09-2022 upon evaluation today patient appears to be doing somewhat poorly in regard to her wound. She did see Dr. Franky Macho. He stated that the wound was not being "packed appropriately" according to the patient and her daughter who were present during the evaluation today as well as the evaluation with him yesterday. With that being said he packed the wound the way he said it needed to be and to be partly honest that is over packed. The patient is having a tremendous amount of discomfort and states that this is no way that she is good to be able to tolerate that. I understand the concern about the whole closing down which is why I recommended trying to keep this open is much as possible and again with that all being said this is still going to continue to be an issue as far as this closing down before everything heals up on the inside. Nonetheless she continues to have issues with significant drainage. She also has an area which almost feels like a small abscess or least of fluid pocket that feels up that was not appropriately packed with the dressings that were in place. With that being said I think that this is going to have to be opened at some point in  time in order to get this to heal I am really not certain the best way to go about this. I discussed that with the patient again today. 07-16-2022 upon evaluation today patient appears to be doing well currently in regard to her wound from the standpoint of the pain getting better which is great news. Fortunately I do not see any evidence of active infection locally or systemically which is great news. No fevers, chills, nausea, vomiting, or diarrhea. With that being said the patient has been tolerating the dressing changes better without complication which is great news. [name], [name] ([id number]) 07-29-2022 upon evaluation today patient appears to be doing actually pretty well in regard to her wound. Fortunately there does not appear to be any signs of infection. She still has drainage and we still are seeing an area from this pocket up at 12:00 that is leaking but fortunately this does not appear to be having any significant issues with infection right now which is great news. Fortunately her pain is also calm down since I saw her 2 weeks ago. 08-05-2022 upon evaluation today patient appears to be doing unfortunately a little worse in regard to the discomfort she is feeling she tells me that this is bothering her pretty much all the time at a low level. It never seems to go away. This is since last week. With that being  said the overall depth is unfortunately a little bit deeper this week which is definitely not what I was hoping to see. I did get to review the second opinion that was requested by the patient from her insurance to have an independent physician review the situation here. The short story of that reviewed which I did read through the entirety of she states that the patient does likely need surgery to correct the seroma considering length of time this has been going on. It was recommended that a plastic surgeon be the one to have this up in case there is a muscle flap necessary but at  minimum that this area needs to be cleaned out and then appropriately close following. The patient has also read through this. She does seem to be a little bit off of her normal game not feeling quite that well today. She is not exactly sure what is going on. 08-12-2022 upon evaluation today patient's wound is actually showing signs of maintaining stability which is good news. Fortunately there does not appear to be any evidence of active infection locally or systemically at this time which is excellent. With that being said she does have a meeting with her nurse with [place] [facility] on Thursday to discuss options for plastic surgery where she is able to go. 08-19-2022 upon evaluation today patient appears to be doing well currently in regard to her wound all things considered. Unfortunately the patient is still continuing to have some issues here with an ongoing open wound in the back despite everything that we have tried she has had an independent review which recommended surgical intervention. Where the process of trying to find someone to take her on as a patient. The suggestion was for plastic surgery which I think is probably a good option here. With that being said she has been looking through her insurance to see who was in network for her and has found someone that I think will be very good. Working to see about making that referral to them. 08-26-2022 upon evaluation today patient appears to be doing well currently in regard to her wound. She tells me she is having some discomfort fortunately there does not appear to be any evidence of active infection at this time which is great news. We have actually gotten her an appointment finally with the plastic surgeon that is 4 I believe it is September 26. Electronic Signature(s) Signed: 08/26/2022 2:44:32 PM By: Worthy Keeler PA-C Entered By: Worthy Keeler on 08/26/2022 14:44:32 [name], [name]  ([id number]) -------------------------------------------------------------------------------- Physical Exam Details Patient Name: Vanzee, Jayni C. Date of Service: 08/26/2022 2:00 PM Medical Record Number: [id number] Patient Account Number: [id number] Date of Birth/Sex: 01-04-1961 (61 y.o. F) Treating RN: Carlene Coria Primary Care Provider: Clayborn Heron Other Clinician: Referring Provider: Card, John Treating Provider/Extender: Skipper Cliche in Treatment: 62 Constitutional Well-nourished and well-hydrated in no acute distress. Respiratory normal breathing without difficulty. Psychiatric this patient is able to make decisions and demonstrates good insight into disease process. Alert and Oriented x 3. pleasant and cooperative. Notes Upon inspection patient's wound bed actually showed signs of good granulation and epithelization at this point. Fortunately there does not appear to be any evidence of active infection at this time which is great news and overall I am extremely pleased with what we are seeing she still does have fluid draining consistent with a seroma but I do not see anything active at this point that appears to be infected which is good  news. Electronic Signature(s) Signed: 08/26/2022 2:45:01 PM By: [name] PA-C Entered By: [name] on 08/26/2022 14:45:01 [name], [name] ([id number]) -------------------------------------------------------------------------------- Physician Orders Details Patient Name: [name], [name] Date of Service: 08/26/2022 2:00 PM Medical Record Number: [id number] Patient Account Number: [id number] Date of Birth/Sex: 30-Oct-1961 (61 y.o. 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 o Nurse Visit as needed Home Health o Home Health Company: [facility] o [facility] for wound care. May utilize formulary equivalent dressing for wound treatment orders unless otherwise specified. Home Health Nurse may visit PRN to address patientos wound care needs. - Monday and Friday [facility] fax [phone] Bathing/ Shower/ Hygiene o May shower; gently cleanse wound with antibacterial soap, rinse and pat dry prior to dressing wounds o No tub bath. Anesthetic (Use 'Patient Medications' Section for Anesthetic Order Entry) o Lidocaine applied to wound bed Edema Control - Lymphedema / Segmental Compressive Device / Other o Elevate, Exercise Daily and Avoid Standing for Long Periods of Time. o Elevate legs to the level of the heart and pump ankles as often as possible o Elevate leg(s) parallel to the floor when sitting. Additional Orders / Instructions o Follow Nutritious Diet and Increase Protein Intake Wound Treatment Wound #1 - Back Wound Laterality: Midline, Distal Cleanser: Normal Saline 1 x Per Day/30 Days Discharge Instructions: Wash your hands with soap and water. Remove old dressing, discard into plastic bag and place into trash. Cleanse the wound with Normal Saline prior to applying a clean dressing using gauze sponges, not tissues or cotton balls. Do not scrub or use excessive force. Pat dry using gauze sponges, not tissue or cotton balls. Primary Dressing: Hydrofera Blue Rope 1 x Per Day/30 Days Discharge Instructions: cut into fourths; angle the end Secondary Dressing: (BORDER) Zetuvit Plus SILICONE BORDER Dressing 4x4 (in/in) 1 x Per Day/30 Days Electronic Signature(s) Signed: 08/26/2022 4:28:34 PM By:  Yevonne Pax RN Signed: 08/26/2022 5:17:38 PM By: [name] PA-C Entered By: Yevonne Pax on 08/26/2022 14:41:14 Hughson, Nisa C. ([id number]) -------------------------------------------------------------------------------- Problem List Details Patient Name: [name], [name] Date of Service: 08/26/2022 2:00 PM Medical Record Number: [id number] Patient Account Number: [id number] Date of Birth/Sex: December 24, 1961 (61 y.o. 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 ([id number]) -------------------------------------------------------------------------------- Progress Note Details Patient Name: [name], [name] Date of Service: 08/26/2022 2:00 PM Medical Record Number: [id number] Patient Account Number: [id number] Date of Birth/Sex: [birth year] (61 y.o. F) Treating RN: Yevonne Pax Primary Care Provider: [name] Other Clinician: Referring Provider: Card, John Treating Provider/Extender: Rowan Blase in Treatment: 45 Subjective Chief  Complaint Information obtained from Patient Surgical Back Ulcer History of Present Illness (HPI) 10/15/2021 upon evaluation today patient presents for initial evaluation here in the clinic concerning a surgical ulceration/dehiscence in the lumbar spine  region following surgery that she had over the past year. This was actually broken up into 3 separate surgical events. The initial surgical intervention actually was on November 05, 2021 almost a year ago. Subsequently the patient went back in February for a seroma of the area which unfortunately required her to have a repeat surgery to go in and clean this out. And then again this occurred in April where she went back in and again they felt like stitches were coming out and there was an additional seroma. She was placed in a wound VAC initially and then subsequently as it got smaller that was discontinued. Again right now I will see anything that I think a wound VAC would help with. Nonetheless she definitely has a significant depth to the wound that is going require packing. I actually believe the Hydrofera Blue rope would probably do quite well with this the problem is as much as it is draining she probably needs this to be changed at least every day. She does not really have anyone that can help with that that is the complicating scenario here. With that being said the patient does have a history of multiple sclerosis, hypertension, and again this surgical wound dehiscence in regard to her lumbar spine region. She did have a repeat MRI which was actually completed 10/09/2021. This showed that there was no significant change in the subcutaneous fluid collection/track of the lower lumbar region. This is extending to the level of the fascia unfortunately. This seems to go all the way from the L2 level with a track extending all the way to the fascia at the L4-5 level. Again this is a significant wound and there is significant drainage but does not seem to communicate to the spinal region as far as spinal fluid or otherwise is concerned that is good news. Nonetheless she last saw Dr. Christella Noa who is her neurosurgeon on 10/01/2021 that was when he ordered this last MRI she supposed to see him next week as  well. With that being said he did not feel like there was any significant issue there but was not sure why this was not healing that is when he ordered the MRI. They were wanting to make sure that this was packed appropriately by home health unfortunately the main issue currently is that home health is completely out of the picture as the patient has exhausted all the home health that that she gets for a year. She is now in a very difficult predicament where she does not have anyone to help her change the dressing and to be honest that she is not able to do it herself with the location of the wound being on the midline lumbar spine region. If she does not have anyone that can help it is probably can to be necessary for her to go to a facility for rehab and daily dressing changes as I feel like daily changes which is much drainage that she is having is going to be necessary. 10/23/2021 upon evaluation today patient appears to be doing decently well in regard to her back ulcer. This does seem to be draining a lot less than what it is been doing in the past. With that being said she still has quite a bit of drainage nonetheless. I do  think that given time this should improve least I hope so. The good news is she does have home health coming out 3 days a week were doing it 2 days a week and she is paying someone we can to help. 10/30/2021 upon evaluation today patient appears to be doing okay in regard to her back ulcer this is not draining quite as bad as it was in the beginning but he still has quite a bit of drainage noted. I do believe that the patient would benefit from [place] going ahead forward with attempting a wound VAC using the Hydrofera Blue rope to pack with and then subsequently using the VAC externally to actually suction out and help this to fill- in. I think this is our ideal way to try to get things cleared at this point. As it stands I am not certain that we are really making a progress that  we want to see near with doing it in the way we are which is packing with the rope. It is a good dressing but I do think it is insufficient for total healing. She just seems to have too much in the way of drainage at this point unfortunately. 11/06/2021 upon evaluation today patient unfortunately continues to have issues with her back ongoing. The good news is her MRI that was repeated showed signs of the size of this area in the lumbar spine region having decreased from 4 cm to 3.5 cm this is definitely not bad news at all. With that being said unfortunately she continues to have issues with ongoing drainage not as severe as in the beginning but nonetheless still significant. I do think a wound VAC still would be a good way to go although her home health agency nurse apparently has some concerns about the possibility of not being able to keep a seal with this as they had struggles in the past. Nonetheless I explained to the patient that this is much different than what she had previous and that I really feel like it would do much better as far as getting the area taken care of without having any complications or issues here. I think that we should be able to maintain a seal. Nonetheless at this time I did discuss with the patient as well that she probably does need to have a wound VAC in order for [place] to get this moving in the right direction. 11/13/2021 upon evaluation patient's wound bed actually showed signs of significant drainage at this time. She did see the surgeon yesterday he did not see anything that appeared to be infected. Nonetheless he does appear that she is continuing to have areas here that just do not seem to want to seal up there MRI findings have been negative but nonetheless she continues to have is the seroma that is filling in. I do feel like we need to try to widen the hole so we can get at least a half of the [facility] then this will be better than nothing at this  point. 11/20/2021 upon evaluation today patient actually appears to be having less pain at this point which is good news and overall she we still do not have the results of the culture back yet it had to be sent out to [place] and we do not have the result back yet. Is doing decently well in regard to her wound. Fortunately there does not appear to be any signs of active infection systemically nor locally at this time. 11/27/2021  upon evaluation today patient appears to be doing well with regard to her wound all things considered there does appear to be less drainage than there was previous. Fortunately I do not see any evidence of worsening of the patient is stating that she is having some issues with back pain. This is somewhat new. Again this I think could be related to the fact that she is having some issues here with infection. We are still waiting to see what the result of her culture shows from susceptibility testing Enterococcus has been identified but we do not know if this is VRE or not. 12/04/2021 upon evaluation today patient appears to be doing well with regard to her wound all things considered. I did have a conversation with Erin from Dr. Lacy Duverney office. Of note she notes that Dr. Joetta Manners really does not want to do anything surgical right now which I completely [name], [name] ([id number]) understand. With that being said I am still leery of how far we will make it getting this to heal short of any type of surgery to open this up and allow [place] to more appropriately packed the wound. Nonetheless I will absolutely give it our best shot as far as that is concerned. I discussed that with the patient today. She voiced understanding. The good news is the drainage today seems to be clear it is no longer cloudy as it was previous I am actually very pleased in that regard. 12/11/2021 since have last seen the patient actually did have an conversation with Dr. Christella Noa with her neurosurgeon.  Again this involve the discussion around whether or not to open the wound and try to apply a wound VAC following. With that being said the decision was made that that may be the best thing to do if the patient was in agreement. Nonetheless I am actually extremely encouraged with what I am seeing today much more than I would have thought. In fact I think that we may be over packing the wound which is why she is having some discomfort at this point as there is much less of the dressing able to get and this time compared to what we saw previous. That is actually really good news. In fact the 6:00 tunnel appears to have filled in is awesome news. At the 12:00 location I am actually able to pack into that area pretty effectively at this point today. In fact I was able to get less of the packing in which I will detail below. 12/18/2021 upon evaluation today patient appears to be doing well with regard to her wound on the back. Fortunately there is no signs of active infection which is great news and overall very pleased with where things stand today. No fevers, chills, nausea, vomiting, or diarrhea. 01/01/2022 upon evaluation today patient appears to be doing decently well in regard to her wound. Fortunately there does not appear to be any signs of anything worsening which is great news. She still continues to have issues with drainage but this is not nearly as significant as what it was in the past. There is all clear drainage no signs of purulence noted. 01/08/2022 upon evaluation today patient appears to be doing well with regard to her wound. With that being said she tells me that she has been having some increased pain over the past week. It does appear based on the amount of Hydrofera Blue that we removed this is probably over pack. Again it is difficult to know exactly how  old the nurses getting all the skin but nonetheless the length of Hydrofera Blue that was utilized was way too much which may be  part of the reason why this felt so uncomfortable. Fortunately I think that we can cut back on that we discussed pieces smaller so hopefully that will not be over packed. 01/22/2021 upon evaluation today patient appears to be doing well With regard to there being no signs of infection at this time. The wound does still tunnel mainly up at the 12:00 location. The depth of the tunnel complete from entry point to the base is about 3.5 cm today. With that being said I do believe that overall she is making good progress this is just a very slow process for her which I know has been frustrating as well. 01/29/2022 upon evaluation today patient appears to be doing well with regard to the wound on her lower back and the lumbar spine region. The good news is the depth that I got this week was a little bit less going up at the 12:00 location as compared to last week. I measured this to be 3.5 cm last week and it was 3.3 cm this week. Again that is a minute shift but nonetheless a good shift in the right direction. Overall I think that we are on the right track. 02/05/2022 upon evaluation today patient appears to be doing well with regard to her wound. In fact right now her measurements are somewhere around 2.9 cm which is definitely smaller and less deep than last week At the 12:00 location. Overall I am very pleased with what we are seeing. 02/19/2022 upon evaluation today patient unfortunately is noting that the wound is actually closing up externally but internally there is still some depth to this. I discussed with her today that we cannot let it close up like this is can end up being a significant issue for her if we allow for that. Subsequently we need to do what we can to try to open this up and keep it open more effectively. She voiced understanding. With that being said we are going to proceed with that procedure today in order to debride away some of the skin on externally that is trying to close and on  this. 02/26/2022 upon evaluation patient appears to be doing better in my opinion after we had to open up this area last week. Again she has a significant amount of healing and overall I am extremely pleased with where things stand currently. I do not see any evidence of active infection locally nor systemically at this time which is great news and in general I think that we are on the right track for getting this hopefully closed final and done. 03/05/2022 upon evaluation today patient unfortunately is doing a little bit worse with regard to the whole size this is starting to get much smaller unfortunately. There does not appear to be any signs of active infection locally nor systemically which is great news. With that being said I am concerned about the fact that the patient is doing quite a bit worse with regard to trapping some fluid and almost feels like she may have a fluid pocket that not only goes in and up towards 12:00 but looks back around towards the more superficial subcutaneous tissue. I am very concerned that this is going to be something that is very hard for [place] to heal short of another surgery to open this up and try to wound VAC from  the inside out. Even then there is no guarantees this is just a very difficult situation to be perfectly honest. 03/14/2022 upon evaluation today patient appears to be doing well with regard to her wound as far as the area staying open and pleased in that regard. With that being said unfortunately she is not doing as well when it comes to the irritation around it appears to be very inflamed and red this is not good that was discussed with her today as well. Subsequently I do believe that working to need to do what we can do to try and calm this down in the past she did well with the Augmentin due to Enterococcus infection I am not sure if were dealing with the same thing or not but at the same time I think that is probably to be my go to at this  point. 03/19/2022 upon evaluation today patient appears to be doing really about the same in regard to her wound that the pain is better. 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 She seems to be improved dramatically with the antibiotic currently although it does not cover for MRSA I am apt to just see how this progresses over the next several days to a week. With that being said the bigger issue here is that we simply do not seem to be making excellent progress and I am concerned about the lack of progress. I discussed with Dr. Rolena Infante with [facility] in [place] and to be honest his recommendation was that the best bet would probably be to get her to a teaching center. She is actually been seen for rheumatology and a I believe psychologist/psychiatrist at [facility]. She would prefer to go that direction as opposed to [facility] or [place]. I am definitely okay with that. 03/26/2022 upon evaluation today patient appears to be doing okay in regard to her wound. She is not showing signs of worsening and overall she tells me that her pain is not nearly as bad as it was previous. Fortunately I do not see any evidence of active infection locally or systemically which is great news. No fevers, chills, nausea, vomiting, or diarrhea. 04-02-2022 upon evaluation today patient appears to be doing well with regard to her wound. It does not appear to be showing any signs of worsening which is great news. With that being said you are still having some issues here with drainage although it is clear and mainly just tenderness with bleeding from the [facility] I do think that treating for the second issue which is MRSA is probably the right thing to do at this point she is now off of the Augmentin. 04-09-2022 upon evaluation today patient's wound actually appears to be doing about the same. Fortunately I do not see any evidence of active infection locally or systemically at this time which is great  news. No fevers, chills, nausea, vomiting, or diarrhea. [name], ALLSBROOK ([id number]) 04-16-2022 upon evaluation today patient appears to be doing well with regard to her wound in the lumbar spine region. Subsequently she continues to have an area that does have a tunnel up into the 12:00 location. This is about 3.5 cm using a skinny probe curved upwards to get into this region. With that being said I really do not see any signs of overall improvement but also do not see any signs of worsening I feel like work on about a still made here to some degree. The patient did see her  neurologist and he has referred her to neurosurgery at [facility] for second opinion we will see what they have to say as well. 04-23-2022 upon evaluation today patient appears to be doing well with regard to her wound all things considered. She has been tolerating the dressing changes without complication. Fortunately I do not see any signs of active infection locally nor systemically which is great news. No fever chills noted 04-30-2022 upon evaluation today patient appears to be doing about the same in regard to her wound. The depth is really not dramatically improved as far as the 12:00 tunnel is concerned. The overall depth and the base of the wound does seem to be a little bit better it does sound like that external wound has closed as far as the opening is concerned we can have to cut this down from a half to a smaller size based on what we are seeing today. 05-07-2022 upon evaluation today patient's wound actually seems to be doing decently well. There is not a major shift here but a slight shift in the depth both straight and as well as in the [place] at 12:00. With that being said I again we will talk about a couple of millimeters but still every little bit can count when there are situations like this to be honest. 05-14-2022 upon evaluation today patient appears to be doing okay in regard to her wound. I do not see any  signs of anything worsening. With that being said also do not see getting significantly better unfortunately. There does not appear to be any evidence of infection locally or systemically which is great news. No fevers, chills, nausea, vomiting, or diarrhea. 05-21-2022 upon evaluation today patient actually appears to be doing quite well in regard to her wound from the standpoint of there being no infection. With that being said there does not appear to be any evidence of infection locally nor systemically which is great news. No fevers, chills, nausea, vomiting, or diarrhea. 05-28-2022 upon evaluation today patient's wound actually appears to be doing about the same. I do not see any evidence of active infection locally or systemically which is great news. No fevers, chills, nausea, vomiting, or diarrhea. 06-04-2022 upon evaluation today patient appears to be doing well with regard to her wound. She has been tolerating dressing changes without complication. Fortunately I do not feel like there is any worsening also I do not feel like there is any improvement. I feel like right a solid area here where she has a tunnel that tracks up at 12:00 and then has a fluid pocket at around roughly 1:00 I can push on this and squeeze fluid out. Again I am not exactly sure what else can be done from my standpoint to improve this. She does have an appointment with Dr. Christella Noa who I do believe is an excellent surgeon and this is the surgeon who performed this surgery initially. Again the biggest issue was following she had a lot of trouble with the wound VAC I do think that we need to try to see if she is can have a wound VAC following surgery what exactly regular need to do to help keep this moving in the right direction for her. Obviously I want her to have the best result possible that she has to go through surgery again to clean this area out. Absolutely willing to help out with getting this to heal. 06-13-2022 upon  evaluation today patient appears to be doing well with regard to her  wound its not any worse unfortunately it is also not significantly better which is the only issue currently. I do not see any signs of active infection locally or systemically which is great news. No fevers, chills, nausea, vomiting, or diarrhea. She does have her appointment on Monday with her neurosurgeon and we will see you Dr. Christella Noa has to say at that point. 06-18-2022 upon evaluation patient appears to be doing a little worse in regard to her wound only in the respect that has been several days since she has had this changed in fact it was last changed on Friday. Since that time she tells me that she has kept this in place over the weekend and yesterday she was supposed to see her neurosurgeon yesterday but they had to cancel due to an emergency surgery according to what the patient tells me. Nonetheless she is having some issues here with drainage coming from the wound that is more purulent in nature I think this is something we have been seeing in the [facility] for several weeks but is more obvious being that it was not changed as much during the last week. She unfortunately is a little worried about this but again I think that this is just part of what we have been dealing with all long is more prevalent and obvious due to the length of time since it was changed last. She does have a repeat appointment for I believe July 10 she told me although she is going to see if she can find anything sooner. 06-25-2022 upon evaluation today patient's wound actually showed signs of marginal improvement which is good news. She still has the area of abscess that seems to be at roughly 2:00 when looking at her back. We can get into this with the skinny probe other than that I really do not know what else I can do to try to make this better. We discussed this and she does have an appointment with her neurosurgeon although that is not  till mid July as he had to change it at the last moment during the time she was post to see him a week ago. She does have evidence of potential infection on culture I am going to go ahead and treat this in order to ensure that nothing worsens. With that being said I do believe that overall she seems to be doing well but I do think we want to make sure that that the knee gets worse in the meantime until she gets to see her neurosurgeon. 07-01-2022 upon evaluation today patient appears to continue to have trouble with her wound draining. Again we have been continue to monitor this and I still see the same issue that I have noted before the patient has an area which is draining seemingly from around the 2:00 location when you go up towards 12:00 and then over towards 2:00 looking at her back this is where there is actually a soft area external that if you push on it you will see fluid come out and subsequently this is also where it tracks to internally. Everything in the 6:00 location has completely healed in and this looks much better in that regard but this upper portion we just cannot get to closed with the methods that we have. It seemed to me that this is probably going to require that this area be open and cleaned out in order to allow it to heal and my suggestion would be that a wound VAC is  probably going to be the ideal thing. 07-09-2022 upon evaluation today patient appears to be doing somewhat poorly in regard to her wound. She did see Dr. Franky Macho. He stated that the wound was not being "packed appropriately" according to the patient and her daughter who were present during the evaluation today as well as the evaluation with him yesterday. With that being said he packed the wound the way he said it needed to be and to be partly honest that is over packed. The patient is having a tremendous amount of discomfort and states that this is no way that she is good to be able to tolerate that.  I understand the concern about the whole closing down which is why I recommended trying to keep this open is much as possible and again with that all being said this is still going to continue to be an issue as far as this closing down before everything heals up on the inside. Nonetheless she continues to have issues with significant drainage. She also has an area which almost feels like a small abscess or least of fluid pocket that feels up that was not appropriately packed with the dressings that were in place. With that being said I think that this is going to have to be opened at some point in time in order to get this to heal I am really not certain the best way to go about this. I discussed that with the patient again today. TARON, CONREY ([id number]) 07-16-2022 upon evaluation today patient appears to be doing well currently in regard to her wound from the standpoint of the pain getting better which is great news. Fortunately I do not see any evidence of active infection locally or systemically which is great news. No fevers, chills, nausea, vomiting, or diarrhea. With that being said the patient has been tolerating the dressing changes better without complication which is great news. 07-29-2022 upon evaluation today patient appears to be doing actually pretty well in regard to her wound. Fortunately there does not appear to be any signs of infection. She still has drainage and we still are seeing an area from this pocket up at 12:00 that is leaking but fortunately this does not appear to be having any significant issues with infection right now which is great news. Fortunately her pain is also calm down since I saw her 2 weeks ago. 08-05-2022 upon evaluation today patient appears to be doing unfortunately a little worse in regard to the discomfort she is feeling she tells me that this is bothering her pretty much all the time at a low level. It never seems to go away. This is since last  week. With that being said the overall depth is unfortunately a little bit deeper this week which is definitely not what I was hoping to see. I did get to review the second opinion that was requested by the patient from her insurance to have an independent physician review the situation here. The short story of that reviewed which I did read through the entirety of she states that the patient does likely need surgery to correct the seroma considering length of time this has been going on. It was recommended that a plastic surgeon be the one to have this up in case there is a muscle flap necessary but at minimum that this area needs to be cleaned out and then appropriately close following. The patient has also read through this. She does seem to be a little bit  off of her normal game not feeling quite that well today. She is not exactly sure what is going on. 08-12-2022 upon evaluation today patient's wound is actually showing signs of maintaining stability which is good news. Fortunately there does not appear to be any evidence of active infection locally or systemically at this time which is excellent. With that being said she does have a meeting with her nurse with [place] [facility] on Thursday to discuss options for plastic surgery where she is able to go. 08-19-2022 upon evaluation today patient appears to be doing well currently in regard to her wound all things considered. Unfortunately the patient is still continuing to have some issues here with an ongoing open wound in the back despite everything that we have tried she has had an independent review which recommended surgical intervention. Where the process of trying to find someone to take her on as a patient. The suggestion was for plastic surgery which I think is probably a good option here. With that being said she has been looking through her insurance to see who was in network for her and has found someone that I think will be very good.  Working to see about making that referral to them. 08-26-2022 upon evaluation today patient appears to be doing well currently in regard to her wound. She tells me she is having some discomfort fortunately there does not appear to be any evidence of active infection at this time which is great news. We have actually gotten her an appointment finally with the plastic surgeon that is 4 I believe it is September 26. Objective Constitutional Well-nourished and well-hydrated in no acute distress. Vitals Time Taken: 2:13 PM, Height: 58 in, Weight: 275 lbs, BMI: 57.5, Temperature: 98.3 F, [name]: 74 bpm, Respiratory Rate: 18 breaths/min, Blood Pressure: 175/98 mmHg. Respiratory normal breathing without difficulty. Psychiatric this patient is able to make decisions and demonstrates good insight into disease process. Alert and Oriented x 3. pleasant and cooperative. General Notes: Upon inspection patient's wound bed actually showed signs of good granulation and epithelization at this point. Fortunately there does not appear to be any evidence of active infection at this time which is great news and overall I am extremely pleased with what we are seeing she still does have fluid draining consistent with a seroma but I do not see anything active at this point that appears to be infected which is good news. Integumentary (Hair, Skin) Wound #1 status is Open. Original cause of wound was Surgical Injury. The date acquired was: 04/11/2021. The wound has been in treatment 45 weeks. The wound is located on the Supreme Back. The wound measures 0.3cm length x 0.3cm width x 2.5cm depth; 0.071cm^2 area and 0.177cm^3 volume. There is Fat Layer (Subcutaneous Tissue) exposed. There is no tunneling or undermining noted. There is a medium amount of serosanguineous drainage noted. There is large (67-100%) red granulation within the wound bed. There is no necrotic tissue within the wound bed. Assessment Active  Problems ICD-10 Uliano, Nomi C. 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 Nurse Visit as needed Home Health: [place]: - [place] for wound care. May utilize formulary equivalent dressing for wound treatment orders unless otherwise specified. Home Health Nurse may visit PRN to address patient s wound care needs. - Monday and Friday [facility] fax  [phone] Bathing/ Shower/ Hygiene: May shower; gently cleanse wound with antibacterial soap, rinse and pat dry prior to dressing wounds No tub bath. Anesthetic (Use 'Patient Medications' Section for Anesthetic Order Entry): Lidocaine applied to wound bed Edema Control - Lymphedema / Segmental Compressive Device / Other: Elevate, Exercise Daily and Avoid Standing for Long Periods of Time. Elevate legs to the level of the heart and pump ankles as often as possible Elevate leg(s) parallel to the floor when sitting. Additional Orders / Instructions: Follow Nutritious Diet and Increase Protein Intake WOUND #1: - Back Wound Laterality: Midline, Distal Cleanser: Normal Saline 1 x Per Day/30 Days Discharge Instructions: Wash your hands with soap and water. Remove old dressing, discard into plastic bag and place into trash. Cleanse the wound with Normal Saline prior to applying a clean dressing using gauze sponges, not tissues or cotton balls. Do not scrub or use excessive force. Pat dry using gauze sponges, not tissue or cotton balls. Primary Dressing: Hydrofera Blue Rope 1 x Per Day/30 Days Discharge Instructions: cut into fourths; angle the end Secondary Dressing: (BORDER) Zetuvit Plus SILICONE BORDER Dressing 4x4 (in/in) 1 x Per Day/30 Days 1. I am good recommend currently that we  go ahead and continue with the wound care measures as before and the patient is in agreement with plan. This includes the use of the [facility] which I do believe has been doing quite well. 2. I am also can recommend that we have the patient continue to monitor for any signs of infection or worsening obviously if anything changes she should let me know right now she is having some pain I did have to pull back the [facility] a little bit once I put in an order to make sure that it was not hurting her she states that felt much better. Nonetheless we will continue with the [facility] Blue packing which I think is still the best way to go. We will see patient back for reevaluation in 1 week here in the clinic. If anything worsens or changes patient will contact our office for additional recommendations. Electronic Signature(s) Signed: 08/26/2022 2:45:41 PM By: Worthy Keeler PA-C Entered By: Worthy Keeler on 08/26/2022 14:45:41 [place], [name] ([id number]) -------------------------------------------------------------------------------- SuperBill Details Patient Name: Macdougal, Adaliz C. Date of Service: 08/26/2022 Medical Record Number: [id number] Patient Account Number: [id number] Date of Birth/Sex: 04-07-1961 (61 y.o. 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 G35 Multiple sclerosis I10 Essential (primary) hypertension Physician Procedures CPT4 Code: [id number] Description: 99213 - WC PHYS LEVEL 3 - EST PT Modifier: Quantity: 1 CPT4 Code: Description: ICD-10 Diagnosis Description L98.422 Non-pressure chronic ulcer of back with fat  layer exposed T81.31XA Disruption of external operation (surgical) wound, not elsewhere classifi G35 Multiple sclerosis I10 Essential (primary) hypertension Modifier: ed, initial encounter Quantity: Electronic Signature(s) Signed: 08/26/2022 2:46:25 PM By: Worthy Keeler PA-C Entered By: Worthy Keeler on 08/26/2022 14:46:25

## 2022-08-27 ENCOUNTER — Ambulatory Visit: Payer: 59 | Admitting: Physician Assistant

## 2022-08-27 ENCOUNTER — Encounter: Payer: 59 | Admitting: Occupational Therapy

## 2022-08-29 ENCOUNTER — Ambulatory Visit: Payer: 59

## 2022-08-30 ENCOUNTER — Encounter: Payer: 59 | Admitting: Occupational Therapy

## 2022-09-03 ENCOUNTER — Encounter: Payer: 59 | Attending: Physician Assistant | Admitting: Physician Assistant

## 2022-09-03 DIAGNOSIS — L98422 Non-pressure chronic ulcer of back with fat layer exposed: Secondary | ICD-10-CM | POA: Insufficient documentation

## 2022-09-03 DIAGNOSIS — T8131XA Disruption of external operation (surgical) wound, not elsewhere classified, initial encounter: Secondary | ICD-10-CM | POA: Diagnosis present

## 2022-09-03 DIAGNOSIS — Y838 Other surgical procedures as the cause of abnormal reaction of the patient, or of later complication, without mention of misadventure at the time of the procedure: Secondary | ICD-10-CM | POA: Diagnosis not present

## 2022-09-03 DIAGNOSIS — G35 Multiple sclerosis: Secondary | ICD-10-CM | POA: Diagnosis not present

## 2022-09-03 DIAGNOSIS — I10 Essential (primary) hypertension: Secondary | ICD-10-CM | POA: Insufficient documentation

## 2022-09-03 NOTE — Progress Notes (Signed)
[name], [name] ([id number]) Visit Report for 09/03/2022 Chief Complaint Document Details Patient Name: [name], [name] Date of Service: 09/03/2022 2:00 PM Medical Record Number: [id number] Patient Account Number: [id number] Date of Birth/Sex: January 09, 1961 (61 y.o. F) Treating RN: Carlene Coria Primary Care Provider: Clayborn Heron Other Clinician: Referring Provider: Card, John Treating Provider/Extender: Skipper Cliche in Treatment: 75 Information Obtained from: Patient Chief Complaint Surgical Back Ulcer Electronic Signature(s) Signed: 09/03/2022 1:59:36 PM By: Worthy Keeler PA-C Entered By: Worthy Keeler on 09/03/2022 13:59:35 [name], [name] ([id number]) -------------------------------------------------------------------------------- HPI Details Patient Name: [name], [name] Date of Service: 09/03/2022 2:00 PM Medical Record Number: [id number] Patient Account Number: [id number] Date of Birth/Sex: [birth year] (61 y.o. 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 This was actually broken up into 3 separate surgical events. The initial surgical intervention actually was on November 05, 2021 almost a year ago. Subsequently the patient went back in February for a seroma of the area which unfortunately required her to have a repeat surgery to go in and clean this out. And then again this occurred in April where she went back in and again they felt like stitches were coming out and there was an additional seroma. She was placed in a wound VAC initially and then subsequently as it got smaller that was  discontinued. Again right now I will see anything that I think a wound VAC would help with. Nonetheless she definitely has a significant depth to the wound that is going require packing. I actually believe the Hydrofera Blue rope would probably do quite well with this the problem is as much as it is draining she probably needs this to be changed at least every day. She does not really have anyone that can help with that that is the complicating scenario here. With that being said the patient does have a history of multiple sclerosis, hypertension, and again this surgical wound dehiscence in regard to her lumbar spine region. She did have a repeat MRI which was actually completed 10/09/2021. This showed that there was no significant change in the subcutaneous fluid collection/track of the lower lumbar region. This is extending to the level of the fascia unfortunately. This seems to go all the way from the L2 level with a track extending all the way to the fascia at the L4-5 level. Again this is a significant wound and there is significant drainage but does not seem to communicate to the spinal region as far as spinal fluid or otherwise is concerned that is good news. Nonetheless she last saw Dr. Christella Noa who is her neurosurgeon on 10/01/2021 that was when he ordered this last MRI she supposed to see him next week as well. With that being said he did not feel like there was any significant issue there but was not sure why this was not healing that is when he ordered the MRI. They were wanting to make sure that this was packed appropriately by home health unfortunately the main issue currently is that home health is completely out of the picture as the patient has exhausted all the home health that that she gets for a year. She is now in a very difficult  predicament where she does not have anyone to help her change the dressing and to be honest that she is not able to do it herself with the location of the  wound being on the midline lumbar spine region. If she does not have anyone that can help it is probably can to be necessary for her to go to a facility for rehab and daily dressing changes as I feel like daily changes which is much drainage that she is having is going to be necessary. 10/23/2021 upon evaluation today patient appears to be doing decently well in regard to her back ulcer. This does seem to be draining a lot less than what it is been doing in the past. With that being said she still has quite a bit of drainage nonetheless. I do think that given time this should improve least I hope so. The good news is she does have home health coming out 3 days a week were doing it 2 days a week and she is paying someone we can to help. 10/30/2021 upon evaluation today patient appears to be doing okay in regard to her back ulcer this is not draining quite as bad as it was in the beginning but he still has quite a bit of drainage noted. I do believe that the patient would benefit from [place] going ahead forward with attempting a wound VAC using the Hydrofera Blue rope to pack with and then subsequently using the VAC externally to actually suction out and help this to fill- in. I think this is our ideal way to try to get things cleared at this point. As it stands I am not certain that we are really making a progress that we want to see near with doing it in the way we are which is packing with the rope. It is a good dressing but I do think it is insufficient for total healing. She just seems to have too much in the way of drainage at this point unfortunately. 11/06/2021 upon evaluation today patient unfortunately continues to have issues with her back ongoing. The good news is her MRI that was repeated showed signs of the size of this area in the lumbar spine region having decreased from 4 cm to 3.5 cm this is definitely not bad news at all. With that being said unfortunately she continues to have issues with  ongoing drainage not as severe as in the beginning but nonetheless still significant. I do think a wound VAC still would be a good way to go although her home health agency nurse apparently has some concerns about the possibility of not being able to keep a seal with this as they had struggles in the past. Nonetheless I explained to the patient that this is much different than what she had previous and that I really feel like it would do much better as far as getting the area taken care of without having any complications or issues here. I think that we should be able to maintain a seal. Nonetheless at this time I did discuss with the patient as well that she probably does need to have a wound VAC in order for [place] to get this moving in the right direction. 11/13/2021 upon evaluation patient's wound bed actually showed signs of significant drainage at this time. She did see the surgeon yesterday he did not see anything that appeared to be infected. Nonetheless he does appear that she is continuing to have areas here that just do not seem  to want to seal up there MRI findings have been negative but nonetheless she continues to have is the seroma that is filling in. I do feel like we need to try to widen the hole so we can get at least a half of the [facility] then this will be better than nothing at this point. 11/20/2021 upon evaluation today patient actually appears to be having less pain at this point which is good news and overall she we still do not have the results of the culture back yet it had to be sent out to [facility] and we do not have the result back yet. Is doing decently well in regard to her wound. Fortunately there does not appear to be any signs of active infection systemically nor locally at this time. 11/27/2021 upon evaluation today patient appears to be doing well with regard to her wound all things considered there does appear to be less drainage than there was previous.  Fortunately I do not see any evidence of worsening of the patient is stating that she is having some issues with back pain. This is somewhat new. Again this I think could be related to the fact that she is having some issues here with infection. We are still waiting to see what the result of her culture shows from susceptibility testing Enterococcus has been identified but we do not know if this is VRE or not. 12/04/2021 upon evaluation today patient appears to be doing well with regard to her wound all things considered. I did have a conversation with Erin from Dr. Sueanne Margarita office. Of note she notes that Dr. Drinda Butts really does not want to do anything surgical right now which I completely understand. With that being said I am still leery of how far we will make it getting this to heal short of any type of surgery to open this up and allow [place] to more appropriately packed the wound. Nonetheless I will absolutely give it our best shot as far as that is concerned. I discussed that with the patient today. She voiced understanding. The good news is the drainage today seems to be clear it is no longer cloudy as it was previous I am actually very pleased in that regard. [name], [name] ([id number]) 12/11/2021 since have last seen the patient actually did have an conversation with Dr. Franky Macho with her neurosurgeon. Again this involve the discussion around whether or not to open the wound and try to apply a wound VAC following. With that being said the decision was made that that may be the best thing to do if the patient was in agreement. Nonetheless I am actually extremely encouraged with what I am seeing today much more than I would have thought. In fact I think that we may be over packing the wound which is why she is having some discomfort at this point as there is much less of the dressing able to get and this time compared to what we saw previous. That is actually really good news. In fact the 6:00  tunnel appears to have filled in is awesome news. At the 12:00 location I am actually able to pack into that area pretty effectively at this point today. In fact I was able to get less of the packing in which I will detail below. 12/18/2021 upon evaluation today patient appears to be doing well with regard to her wound on the back. Fortunately there is no signs of active infection which is great news and overall  very pleased with where things stand today. No fevers, chills, nausea, vomiting, or diarrhea. 01/01/2022 upon evaluation today patient appears to be doing decently well in regard to her wound. Fortunately there does not appear to be any signs of anything worsening which is great news. She still continues to have issues with drainage but this is not nearly as significant as what it was in the past. There is all clear drainage no signs of purulence noted. 01/08/2022 upon evaluation today patient appears to be doing well with regard to her wound. With that being said she tells me that she has been having some increased pain over the past week. It does appear based on the amount of Hydrofera Blue that we removed this is probably over pack. Again it is difficult to know exactly how old the nurses getting all the skin but nonetheless the length of Hydrofera Blue that was utilized was way too much which may be part of the reason why this felt so uncomfortable. Fortunately I think that we can cut back on that we discussed pieces smaller so hopefully that will not be over packed. 01/22/2021 upon evaluation today patient appears to be doing well With regard to there being no signs of infection at this time. The wound does still tunnel mainly up at the 12:00 location. The depth of the tunnel complete from entry point to the base is about 3.5 cm today. With that being said I do believe that overall she is making good progress this is just a very slow process for her which I know has been frustrating as  well. 01/29/2022 upon evaluation today patient appears to be doing well with regard to the wound on her lower back and the lumbar spine region. The good news is the depth that I got this week was a little bit less going up at the 12:00 location as compared to last week. I measured this to be 3.5 cm last week and it was 3.3 cm this week. Again that is a minute shift but nonetheless a good shift in the right direction. Overall I think that we are on the right track. 02/05/2022 upon evaluation today patient appears to be doing well with regard to her wound. In fact right now her measurements are somewhere around 2.9 cm which is definitely smaller and less deep than last week At the 12:00 location. Overall I am very pleased with what we are seeing. 02/19/2022 upon evaluation today patient unfortunately is noting that the wound is actually closing up externally but internally there is still some depth to this. I discussed with her today that we cannot let it close up like this is can end up being a significant issue for her if we allow for that. Subsequently we need to do what we can to try to open this up and keep it open more effectively. She voiced understanding. With that being said we are going to proceed with that procedure today in order to debride away some of the skin on externally that is trying to close and on this. 02/26/2022 upon evaluation patient appears to be doing better in my opinion after we had to open up this area last week. Again she has a significant amount of healing and overall I am extremely pleased with where things stand currently. I do not see any evidence of active infection locally nor systemically at this time which is great news and in general I think that we are on the right track for getting  this hopefully closed final and done. 03/05/2022 upon evaluation today patient unfortunately is doing a little bit worse with regard to the whole size this is starting to get much  smaller unfortunately. There does not appear to be any signs of active infection locally nor systemically which is great news. With that being said I am concerned about the fact that the patient is doing quite a bit worse with regard to trapping some fluid and almost feels like she may have a fluid pocket that not only goes in and up towards 12:00 but looks back around towards the more superficial subcutaneous tissue. I am very concerned that this is going to be something that is very hard for [place] to heal short of another surgery to open this up and try to wound VAC from the inside out. Even then there is no guarantees this is just a very difficult situation to be perfectly honest. 03/14/2022 upon evaluation today patient appears to be doing well with regard to her wound as far as the area staying open and pleased in that regard. With that being said unfortunately she is not doing as well when it comes to the irritation around it appears to be very inflamed and red this is not good that was discussed with her today as well. Subsequently I do believe that working to need to do what we can do to try and calm this down in the past she did well with the Augmentin due to Enterococcus infection I am not sure if were dealing with the same thing or not but at the same time I think that is probably to be my go to at this point. 03/19/2022 upon evaluation today patient appears to be doing really about the same in regard to her wound that the pain is better. 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 She seems to be improved dramatically with the antibiotic currently although it does not cover for MRSA I am apt to just see how this progresses over the next several days to a week. With that being said the bigger issue here is that we simply do not seem to be making excellent progress and I am concerned about the lack of progress. I discussed with Dr. Shon Baton with [facility] in [place]  and to be honest his recommendation was that the best bet would probably be to get her to a teaching center. She is actually been seen for rheumatology and a I believe psychologist/psychiatrist at [facility]. She would prefer to go that direction as opposed to [facility] or [facility]. I am definitely okay with that. 03/26/2022 upon evaluation today patient appears to be doing okay in regard to her wound. She is not showing signs of worsening and overall she tells me that her pain is not nearly as bad as it was previous. Fortunately I do not see any evidence of active infection locally or systemically which is great news. No fevers, chills, nausea, vomiting, or diarrhea. 04-02-2022 upon evaluation today patient appears to be doing well with regard to her wound. It does not appear to be showing any signs of worsening which is great news. With that being said you are still having some issues here with drainage although it is clear and mainly just tenderness with bleeding from the [facility] I do think that treating for the second issue which is MRSA is probably the right thing to do at this point she is now off of the Augmentin. 04-09-2022 upon  evaluation today patient's wound actually appears to be doing about the same. Fortunately I do not see any evidence of active infection locally or systemically at this time which is great news. No fevers, chills, nausea, vomiting, or diarrhea. 04-16-2022 upon evaluation today patient appears to be doing well with regard to her wound in the lumbar spine region. Subsequently she continues to have an area that does have a tunnel up into the 12:00 location. This is about 3.5 cm using a skinny probe curved upwards to get into this region. With that being said I really do not see any signs of overall improvement but also do not see any signs of worsening I feel like work on about a still made here to some degree. The patient did see her neurologist and he has referred her to  neurosurgery at [facility] for second NAYELIZ, DECORD[place]. ([id number]) opinion we will see what they have to say as well. 04-23-2022 upon evaluation today patient appears to be doing well with regard to her wound all things considered. She has been tolerating the dressing changes without complication. Fortunately I do not see any signs of active infection locally nor systemically which is great news. No fever chills noted 04-30-2022 upon evaluation today patient appears to be doing about the same in regard to her wound. The depth is really not dramatically improved as far as the 12:00 tunnel is concerned. The overall depth and the base of the wound does seem to be a little bit better it does sound like that external wound has closed as far as the opening is concerned we can have to cut this down from a half to a smaller size based on what we are seeing today. 05-07-2022 upon evaluation today patient's wound actually seems to be doing decently well. There is not a major shift here but a slight shift in the depth both straight and as well as in the [place] at 12:00. With that being said I again we will talk about a couple of millimeters but still every little bit can count when there are situations like this to be honest. 05-14-2022 upon evaluation today patient appears to be doing okay in regard to her wound. I do not see any signs of anything worsening. With that being said also do not see getting significantly better unfortunately. There does not appear to be any evidence of infection locally or systemically which is great news. No fevers, chills, nausea, vomiting, or diarrhea. 05-21-2022 upon evaluation today patient actually appears to be doing quite well in regard to her wound from the standpoint of there being no infection. With that being said there does not appear to be any evidence of infection locally nor systemically which is great news. No fevers, chills, nausea, vomiting, or diarrhea. 05-28-2022  upon evaluation today patient's wound actually appears to be doing about the same. I do not see any evidence of active infection locally or systemically which is great news. No fevers, chills, nausea, vomiting, or diarrhea. 06-04-2022 upon evaluation today patient appears to be doing well with regard to her wound. She has been tolerating dressing changes without complication. Fortunately I do not feel like there is any worsening also I do not feel like there is any improvement. I feel like right a solid area here where she has a tunnel that tracks up at 12:00 and then has a fluid pocket at around roughly 1:00 I can push on this and squeeze fluid out. Again I am not exactly sure  what else can be done from my standpoint to improve this. She does have an appointment with Dr. Christella Noa who I do believe is an excellent surgeon and this is the surgeon who performed this surgery initially. Again the biggest issue was following she had a lot of trouble with the wound VAC I do think that we need to try to see if she is can have a wound VAC following surgery what exactly regular need to do to help keep this moving in the right direction for her. Obviously I want her to have the best result possible that she has to go through surgery again to clean this area out. Absolutely willing to help out with getting this to heal. 06-13-2022 upon evaluation today patient appears to be doing well with regard to her wound its not any worse unfortunately it is also not significantly better which is the only issue currently. I do not see any signs of active infection locally or systemically which is great news. No fevers, chills, nausea, vomiting, or diarrhea. She does have her appointment on Monday with her neurosurgeon and we will see you Dr. Christella Noa has to say at that point. 06-18-2022 upon evaluation patient appears to be doing a little worse in regard to her wound only in the respect that has been several days since she has had  this changed in fact it was last changed on Friday. Since that time she tells me that she has kept this in place over the weekend and yesterday she was supposed to see her neurosurgeon yesterday but they had to cancel due to an emergency surgery according to what the patient tells me. Nonetheless she is having some issues here with drainage coming from the wound that is more purulent in nature I think this is something we have been seeing in the [facility] for several weeks but is more obvious being that it was not changed as much during the last week. She unfortunately is a little worried about this but again I think that this is just part of what we have been dealing with all long is more prevalent and obvious due to the length of time since it was changed last. She does have a repeat appointment for I believe July 10 she told me although she is going to see if she can find anything sooner. 06-25-2022 upon evaluation today patient's wound actually showed signs of marginal improvement which is good news. She still has the area of abscess that seems to be at roughly 2:00 when looking at her back. We can get into this with the skinny probe other than that I really do not know what else I can do to try to make this better. We discussed this and she does have an appointment with her neurosurgeon although that is not till mid July as he had to change it at the last moment during the time she was post to see him a week ago. She does have evidence of potential infection on culture I am going to go ahead and treat this in order to ensure that nothing worsens. With that being said I do believe that overall she seems to be doing well but I do think we want to make sure that that the knee gets worse in the meantime until she gets to see her neurosurgeon. 07-01-2022 upon evaluation today patient appears to continue to have trouble with her wound draining. Again we have been continue to monitor this and I still  see  the same issue that I have noted before the patient has an area which is draining seemingly from around the 2:00 location when you go up towards 12:00 and then over towards 2:00 looking at her back this is where there is actually a soft area external that if you push on it you will see fluid come out and subsequently this is also where it tracks to internally. Everything in the 6:00 location has completely healed in and this looks much better in that regard but this upper portion we just cannot get to closed with the methods that we have. It seemed to me that this is probably going to require that this area be open and cleaned out in order to allow it to heal and my suggestion would be that a wound VAC is probably going to be the ideal thing. 07-09-2022 upon evaluation today patient appears to be doing somewhat poorly in regard to her wound. She did see Dr. Franky Macho. He stated that the wound was not being "packed appropriately" according to the patient and her daughter who were present during the evaluation today as well as the evaluation with him yesterday. With that being said he packed the wound the way he said it needed to be and to be partly honest that is over packed. The patient is having a tremendous amount of discomfort and states that this is no way that she is good to be able to tolerate that. I understand the concern about the whole closing down which is why I recommended trying to keep this open is much as possible and again with that all being said this is still going to continue to be an issue as far as this closing down before everything heals up on the inside. Nonetheless she continues to have issues with significant drainage. She also has an area which almost feels like a small abscess or least of fluid pocket that feels up that was not appropriately packed with the dressings that were in place. With that being said I think that this is going to have to be opened at some point in  time in order to get this to heal I am really not certain the best way to go about this. I discussed that with the patient again today. 07-16-2022 upon evaluation today patient appears to be doing well currently in regard to her wound from the standpoint of the pain getting better which is great news. Fortunately I do not see any evidence of active infection locally or systemically which is great news. No fevers, chills, nausea, vomiting, or diarrhea. With that being said the patient has been tolerating the dressing changes better without complication which is great news. [name], [name] ([id number]) 07-29-2022 upon evaluation today patient appears to be doing actually pretty well in regard to her wound. Fortunately there does not appear to be any signs of infection. She still has drainage and we still are seeing an area from this pocket up at 12:00 that is leaking but fortunately this does not appear to be having any significant issues with infection right now which is great news. Fortunately her pain is also calm down since I saw her 2 weeks ago. 08-05-2022 upon evaluation today patient appears to be doing unfortunately a little worse in regard to the discomfort she is feeling she tells me that this is bothering her pretty much all the time at a low level. It never seems to go away. This is since last week. With that being  said the overall depth is unfortunately a little bit deeper this week which is definitely not what I was hoping to see. I did get to review the second opinion that was requested by the patient from her insurance to have an independent physician review the situation here. The short story of that reviewed which I did read through the entirety of she states that the patient does likely need surgery to correct the seroma considering length of time this has been going on. It was recommended that a plastic surgeon be the one to have this up in case there is a muscle flap necessary but at  minimum that this area needs to be cleaned out and then appropriately close following. The patient has also read through this. She does seem to be a little bit off of her normal game not feeling quite that well today. She is not exactly sure what is going on. 08-12-2022 upon evaluation today patient's wound is actually showing signs of maintaining stability which is good news. Fortunately there does not appear to be any evidence of active infection locally or systemically at this time which is excellent. With that being said she does have a meeting with her nurse with [place] [facility] on Thursday to discuss options for plastic surgery where she is able to go. 08-19-2022 upon evaluation today patient appears to be doing well currently in regard to her wound all things considered. Unfortunately the patient is still continuing to have some issues here with an ongoing open wound in the back despite everything that we have tried she has had an independent review which recommended surgical intervention. Where the process of trying to find someone to take her on as a patient. The suggestion was for plastic surgery which I think is probably a good option here. With that being said she has been looking through her insurance to see who was in network for her and has found someone that I think will be very good. Working to see about making that referral to them. 08-26-2022 upon evaluation today patient appears to be doing well currently in regard to her wound. She tells me she is having some discomfort fortunately there does not appear to be any evidence of active infection at this time which is great news. We have actually gotten her an appointment finally with the plastic surgeon that is 4 I believe it is September 26. 09-03-2022 upon evaluation today patient appears to be doing well currently in regard to her wound everything appears to be stable which is good in that regard. With that being said the drainage  does appear to be a little more thick compared to normal she has been having a lot of irritation as well. I do believe that it may be at the point that the repeating the Augmentin that she is previously done well with in the past could be of benefit for her here currently. The patient voiced understanding she has not had this since September 27 the last time I prescribed this for her. Nonetheless it did do a very good job to calm things down quite a bit back at that time. Electronic Signature(s) Signed: 09/03/2022 2:31:27 PM By: Worthy Keeler PA-C Entered By: Worthy Keeler on 09/03/2022 14:31:27 [name], [name] ([id number]) -------------------------------------------------------------------------------- Physical Exam Details Patient Name: [name], [name] Date of Service: 09/03/2022 2:00 PM Medical Record Number: [id number] Patient Account Number: [id number] Date of Birth/Sex: 1961/08/30 (61 y.o. F) Treating RN: Carlene Coria Primary Care Provider: Card,  John Other Clinician: Referring Provider: Card, John Treating Provider/Extender: Skipper Cliche in Treatment: 38 Constitutional Well-nourished and well-hydrated in no acute distress. Respiratory normal breathing without difficulty. Psychiatric this patient is able to make decisions and demonstrates good insight into disease process. Alert and Oriented x 3. pleasant and cooperative. Notes Upon inspection patient's wound bed actually showed signs of good granulation and epithelization at this point. Fortunately I do not see any evidence of active infection locally or systemically which is great news and overall I am extremely pleased with where we stand today. No fevers, chills, nausea, vomiting, or diarrhea. Electronic Signature(s) Signed: 09/03/2022 2:31:47 PM By: Worthy Keeler PA-C Entered By: Worthy Keeler on 09/03/2022 14:31:47 [name], [name]  ([id number]) -------------------------------------------------------------------------------- Physician Orders Details Patient Name: Ding, Jupiter C. Date of Service: 09/03/2022 2:00 PM Medical Record Number: [id number] Patient Account Number: [id number] Date of Birth/Sex: 06-08-1961 (61 y.o. 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 o Nurse Visit as needed [facility] for wound care. May utilize formulary equivalent dressing for wound treatment orders unless otherwise specified. Home Health Nurse may visit PRN to address patientos wound care needs. - Monday and Friday [facility] fax [phone] Bathing/ Shower/ Hygiene o May shower; gently cleanse wound with antibacterial soap, rinse and pat dry prior to dressing wounds o No tub bath. Anesthetic (Use 'Patient Medications' Section for Anesthetic Order Entry) o Lidocaine applied to wound bed Edema Control - Lymphedema / Segmental Compressive Device / Other o Elevate, Exercise Daily and Avoid Standing for Long Periods of Time. o Elevate legs to the level of the heart and pump ankles as often as possible o Elevate leg(s) parallel to the floor when sitting. Additional Orders / Instructions o Follow Nutritious Diet and Increase Protein Intake Wound Treatment Wound #1 - Back Wound Laterality: Midline, Distal Cleanser: Normal Saline 3 x Per Week/30 Days Discharge Instructions:  Wash your hands with soap and water. Remove old dressing, discard into plastic bag and place into trash. Cleanse the wound with Normal Saline prior to applying a clean dressing using gauze sponges, not tissues or cotton balls. Do not scrub or use excessive force. Pat dry using gauze sponges, not tissue or cotton balls. Primary Dressing: Hydrofera Blue Rope 3 x Per Week/30 Days Discharge Instructions: cut into fourths; angle the end Secondary Dressing: (BORDER) Zetuvit Plus SILICONE BORDER Dressing 4x4 (in/in) 3 x Per Week/30 Days Patient Medications Allergies: No Known Allergies Notifications Medication Indication Start End amoxicillin-pot clavulanate 09/03/2022 DOSE 1 - oral 875 mg-125 mg tablet - 1 tablet oral taken 2 times per day for 14 days [name], [name] ([id number]) Electronic Signature(s) Signed: 09/03/2022 2:33:08 PM By: Worthy Keeler PA-C Entered By: Worthy Keeler on 09/03/2022 14:33:07 Covell, Kirat C. ([id number]) -------------------------------------------------------------------------------- Problem List Details Patient Name: Kamen, Malayjah C. Date of Service: 09/03/2022 2:00 PM Medical Record Number: [id number] Patient Account Number: [id number] Date of Birth/Sex: Apr 27, 1961 (61 y.o. 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 ([id number]) -------------------------------------------------------------------------------- Progress Note Details Patient Name: [name], [name] Date of Service: 09/03/2022 2:00 PM Medical Record Number: [id number] Patient Account Number: [id number] Date of Birth/Sex: March 31, 1961 (61 y.o. F) Treating RN: Carlene Coria Primary Care Provider: Clayborn Heron Other Clinician: Referring Provider: Card, John Treating Provider/Extender: Skipper Cliche in Treatment: 47 Subjective Chief Complaint Information obtained from Patient Surgical Back Ulcer History of Present Illness (HPI) 10/15/2021 upon evaluation today patient presents for initial evaluation here in the clinic concerning a surgical ulceration/dehiscence in the lumbar spine region following surgery that she had over the past year. This was actually broken up into 3 separate surgical events. The initial surgical intervention actually was on November 05, 2021 almost a year ago. Subsequently the patient went back in February for a seroma of the area which unfortunately required her to have a repeat surgery to go in and clean this out. And then again this occurred in April where she went back in and again they felt like stitches were coming out and there was an additional seroma. She was placed in a wound VAC initially and then subsequently as it got smaller that was discontinued. Again right now I will see anything that I think a wound VAC would help with. Nonetheless she definitely has a significant depth to the wound that is going require packing. I actually believe the Hydrofera Blue rope would probably do quite well with this the problem is as much as it is draining she probably needs this to be changed at least every day. She does not really have anyone that can help with that that is the complicating scenario here. With that being said the patient does have a history of multiple  sclerosis, hypertension, and again this surgical wound dehiscence in regard to her lumbar spine region. She did have a repeat MRI which was actually completed 10/09/2021. This showed that there was no significant change in the subcutaneous fluid collection/track of the lower lumbar region. This is extending to the level of the fascia unfortunately. This seems to go all the way from the L2 level with a track extending all the way to the fascia at the L4-5 level. Again this is a significant wound and there is significant drainage but does not seem to communicate to the spinal region as far as spinal fluid or otherwise is concerned that is good news. Nonetheless she last saw Dr. Christella Noa who is her neurosurgeon on 10/01/2021 that was when he ordered this last MRI she supposed to see him next week as well. With that being said he did not feel like there was any significant issue there but was not sure why this was not healing that is when he ordered the MRI. They were wanting to make sure that this was packed appropriately by home health unfortunately the main issue currently is that home health is completely out of the picture as the patient has exhausted all the home health that that she gets for a year. She is now in a very difficult predicament where she does  not have anyone to help her change the dressing and to be honest that she is not able to do it herself with the location of the wound being on the midline lumbar spine region. If she does not have anyone that can help it is probably can to be necessary for her to go to a facility for rehab and daily dressing changes as I feel like daily changes which is much drainage that she is having is going to be necessary. 10/23/2021 upon evaluation today patient appears to be doing decently well in regard to her back ulcer. This does seem to be draining a lot less than what it is been doing in the past. With that being said she still has quite a bit of  drainage nonetheless. I do think that given time this should improve least I hope so. The good news is she does have home health coming out 3 days a week were doing it 2 days a week and she is paying someone we can to help. 10/30/2021 upon evaluation today patient appears to be doing okay in regard to her back ulcer this is not draining quite as bad as it was in the beginning but he still has quite a bit of drainage noted. I do believe that the patient would benefit from [place] going ahead forward with attempting a wound VAC using the Hydrofera Blue rope to pack with and then subsequently using the VAC externally to actually suction out and help this to fill- in. I think this is our ideal way to try to get things cleared at this point. As it stands I am not certain that we are really making a progress that we want to see near with doing it in the way we are which is packing with the rope. It is a good dressing but I do think it is insufficient for total healing. She just seems to have too much in the way of drainage at this point unfortunately. 11/06/2021 upon evaluation today patient unfortunately continues to have issues with her back ongoing. The good news is her MRI that was repeated showed signs of the size of this area in the lumbar spine region having decreased from 4 cm to 3.5 cm this is definitely not bad news at all. With that being said unfortunately she continues to have issues with ongoing drainage not as severe as in the beginning but nonetheless still significant. I do think a wound VAC still would be a good way to go although her home health agency nurse apparently has some concerns about the possibility of not being able to keep a seal with this as they had struggles in the past. Nonetheless I explained to the patient that this is much different than what she had previous and that I really feel like it would do much better as far as getting the area taken care of without having  any complications or issues here. I think that we should be able to maintain a seal. Nonetheless at this time I did discuss with the patient as well that she probably does need to have a wound VAC in order for [place] to get this moving in the right direction. 11/13/2021 upon evaluation patient's wound bed actually showed signs of significant drainage at this time. She did see the surgeon yesterday he did not see anything that appeared to be infected. Nonetheless he does appear that she is continuing to have areas here that just do not seem to want to seal  up there MRI findings have been negative but nonetheless she continues to have is the seroma that is filling in. I do feel like we need to try to widen the hole so we can get at least a half of the [facility] then this will be better than nothing at this point. 11/20/2021 upon evaluation today patient actually appears to be having less pain at this point which is good news and overall she we still do not have the results of the culture back yet it had to be sent out to [place] and we do not have the result back yet. Is doing decently well in regard to her wound. Fortunately there does not appear to be any signs of active infection systemically nor locally at this time. 11/27/2021 upon evaluation today patient appears to be doing well with regard to her wound all things considered there does appear to be less drainage than there was previous. Fortunately I do not see any evidence of worsening of the patient is stating that she is having some issues with back pain. This is somewhat new. Again this I think could be related to the fact that she is having some issues here with infection. We are still waiting to see what the result of her culture shows from susceptibility testing Enterococcus has been identified but we do not know if this is VRE or not. 12/04/2021 upon evaluation today patient appears to be doing well with regard to her wound all things  considered. I did have a conversation with Erin from Dr. Lacy Duverney office. Of note she notes that Dr. Joetta Manners really does not want to do anything surgical right now which I completely Hoogland, Samanda C. ([id number]) understand. With that being said I am still leery of how far we will make it getting this to heal short of any type of surgery to open this up and allow [place] to more appropriately packed the wound. Nonetheless I will absolutely give it our best shot as far as that is concerned. I discussed that with the patient today. She voiced understanding. The good news is the drainage today seems to be clear it is no longer cloudy as it was previous I am actually very pleased in that regard. 12/11/2021 since have last seen the patient actually did have an conversation with Dr. Christella Noa with her neurosurgeon. Again this involve the discussion around whether or not to open the wound and try to apply a wound VAC following. With that being said the decision was made that that may be the best thing to do if the patient was in agreement. Nonetheless I am actually extremely encouraged with what I am seeing today much more than I would have thought. In fact I think that we may be over packing the wound which is why she is having some discomfort at this point as there is much less of the dressing able to get and this time compared to what we saw previous. That is actually really good news. In fact the 6:00 tunnel appears to have filled in is awesome news. At the 12:00 location I am actually able to pack into that area pretty effectively at this point today. In fact I was able to get less of the packing in which I will detail below. 12/18/2021 upon evaluation today patient appears to be doing well with regard to her wound on the back. Fortunately there is no signs of active infection which is great news and overall very pleased with where things  stand today. No fevers, chills, nausea, vomiting, or  diarrhea. 01/01/2022 upon evaluation today patient appears to be doing decently well in regard to her wound. Fortunately there does not appear to be any signs of anything worsening which is great news. She still continues to have issues with drainage but this is not nearly as significant as what it was in the past. There is all clear drainage no signs of purulence noted. 01/08/2022 upon evaluation today patient appears to be doing well with regard to her wound. With that being said she tells me that she has been having some increased pain over the past week. It does appear based on the amount of Hydrofera Blue that we removed this is probably over pack. Again it is difficult to know exactly how old the nurses getting all the skin but nonetheless the length of Hydrofera Blue that was utilized was way too much which may be part of the reason why this felt so uncomfortable. Fortunately I think that we can cut back on that we discussed pieces smaller so hopefully that will not be over packed. 01/22/2021 upon evaluation today patient appears to be doing well With regard to there being no signs of infection at this time. The wound does still tunnel mainly up at the 12:00 location. The depth of the tunnel complete from entry point to the base is about 3.5 cm today. With that being said I do believe that overall she is making good progress this is just a very slow process for her which I know has been frustrating as well. 01/29/2022 upon evaluation today patient appears to be doing well with regard to the wound on her lower back and the lumbar spine region. The good news is the depth that I got this week was a little bit less going up at the 12:00 location as compared to last week. I measured this to be 3.5 cm last week and it was 3.3 cm this week. Again that is a minute shift but nonetheless a good shift in the right direction. Overall I think that we are on the right track. 02/05/2022 upon evaluation today  patient appears to be doing well with regard to her wound. In fact right now her measurements are somewhere around 2.9 cm which is definitely smaller and less deep than last week At the 12:00 location. Overall I am very pleased with what we are seeing. 02/19/2022 upon evaluation today patient unfortunately is noting that the wound is actually closing up externally but internally there is still some depth to this. I discussed with her today that we cannot let it close up like this is can end up being a significant issue for her if we allow for that. Subsequently we need to do what we can to try to open this up and keep it open more effectively. She voiced understanding. With that being said we are going to proceed with that procedure today in order to debride away some of the skin on externally that is trying to close and on this. 02/26/2022 upon evaluation patient appears to be doing better in my opinion after we had to open up this area last week. Again she has a significant amount of healing and overall I am extremely pleased with where things stand currently. I do not see any evidence of active infection locally nor systemically at this time which is great news and in general I think that we are on the right track for getting this hopefully closed final  and done. 03/05/2022 upon evaluation today patient unfortunately is doing a little bit worse with regard to the whole size this is starting to get much smaller unfortunately. There does not appear to be any signs of active infection locally nor systemically which is great news. With that being said I am concerned about the fact that the patient is doing quite a bit worse with regard to trapping some fluid and almost feels like she may have a fluid pocket that not only goes in and up towards 12:00 but looks back around towards the more superficial subcutaneous tissue. I am very concerned that this is going to be something that is very hard for [place] to heal  short of another surgery to open this up and try to wound VAC from the inside out. Even then there is no guarantees this is just a very difficult situation to be perfectly honest. 03/14/2022 upon evaluation today patient appears to be doing well with regard to her wound as far as the area staying open and pleased in that regard. With that being said unfortunately she is not doing as well when it comes to the irritation around it appears to be very inflamed and red this is not good that was discussed with her today as well. Subsequently I do believe that working to need to do what we can do to try and calm this down in the past she did well with the Augmentin due to Enterococcus infection I am not sure if were dealing with the same thing or not but at the same time I think that is probably to be my go to at this point. 03/19/2022 upon evaluation today patient appears to be doing really about the same in regard to her wound that the pain is better. 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 She seems to be improved dramatically with the antibiotic currently although it does not cover for MRSA I am apt to just see how this progresses over the next several days to a week. With that being said the bigger issue here is that we simply do not seem to be making excellent progress and I am concerned about the lack of progress. I discussed with Dr. Rolena Infante with [facility] in [place] and to be honest his recommendation was that the best bet would probably be to get her to a teaching center. She is actually been seen for rheumatology and a I believe psychologist/psychiatrist at [facility]. She would prefer to go that direction as opposed to [facility] or [place]. I am definitely okay with that. 03/26/2022 upon evaluation today patient appears to be doing okay in regard to her wound. She is not showing signs of worsening and overall she tells me that her pain is not nearly as bad as it was previous.  Fortunately I do not see any evidence of active infection locally or systemically which is great news. No fevers, chills, nausea, vomiting, or diarrhea. 04-02-2022 upon evaluation today patient appears to be doing well with regard to her wound. It does not appear to be showing any signs of worsening which is great news. With that being said you are still having some issues here with drainage although it is clear and mainly just tenderness with bleeding from the [facility] I do think that treating for the second issue which is MRSA is probably the right thing to do at this point she is now off of the Augmentin. 04-09-2022 upon evaluation today patient's wound  actually appears to be doing about the same. Fortunately I do not see any evidence of active infection locally or systemically at this time which is great news. No fevers, chills, nausea, vomiting, or diarrhea. DOLPHINE, SKALICKY ([id number]) 04-16-2022 upon evaluation today patient appears to be doing well with regard to her wound in the lumbar spine region. Subsequently she continues to have an area that does have a tunnel up into the 12:00 location. This is about 3.5 cm using a skinny probe curved upwards to get into this region. With that being said I really do not see any signs of overall improvement but also do not see any signs of worsening I feel like work on about a still made here to some degree. The patient did see her neurologist and he has referred her to neurosurgery at [facility] for second opinion we will see what they have to say as well. 04-23-2022 upon evaluation today patient appears to be doing well with regard to her wound all things considered. She has been tolerating the dressing changes without complication. Fortunately I do not see any signs of active infection locally nor systemically which is great news. No fever chills noted 04-30-2022 upon evaluation today patient appears to be doing about the same in regard to her wound.  The depth is really not dramatically improved as far as the 12:00 tunnel is concerned. The overall depth and the base of the wound does seem to be a little bit better it does sound like that external wound has closed as far as the opening is concerned we can have to cut this down from a half to a smaller size based on what we are seeing today. 05-07-2022 upon evaluation today patient's wound actually seems to be doing decently well. There is not a major shift here but a slight shift in the depth both straight and as well as in the [place] at 12:00. With that being said I again we will talk about a couple of millimeters but still every little bit can count when there are situations like this to be honest. 05-14-2022 upon evaluation today patient appears to be doing okay in regard to her wound. I do not see any signs of anything worsening. With that being said also do not see getting significantly better unfortunately. There does not appear to be any evidence of infection locally or systemically which is great news. No fevers, chills, nausea, vomiting, or diarrhea. 05-21-2022 upon evaluation today patient actually appears to be doing quite well in regard to her wound from the standpoint of there being no infection. With that being said there does not appear to be any evidence of infection locally nor systemically which is great news. No fevers, chills, nausea, vomiting, or diarrhea. 05-28-2022 upon evaluation today patient's wound actually appears to be doing about the same. I do not see any evidence of active infection locally or systemically which is great news. No fevers, chills, nausea, vomiting, or diarrhea. 06-04-2022 upon evaluation today patient appears to be doing well with regard to her wound. She has been tolerating dressing changes without complication. Fortunately I do not feel like there is any worsening also I do not feel like there is any improvement. I feel like right a solid area here where  she has a tunnel that tracks up at 12:00 and then has a fluid pocket at around roughly 1:00 I can push on this and squeeze fluid out. Again I am not exactly sure what else can be  done from my standpoint to improve this. She does have an appointment with Dr. Christella Noa who I do believe is an excellent surgeon and this is the surgeon who performed this surgery initially. Again the biggest issue was following she had a lot of trouble with the wound VAC I do think that we need to try to see if she is can have a wound VAC following surgery what exactly regular need to do to help keep this moving in the right direction for her. Obviously I want her to have the best result possible that she has to go through surgery again to clean this area out. Absolutely willing to help out with getting this to heal. 06-13-2022 upon evaluation today patient appears to be doing well with regard to her wound its not any worse unfortunately it is also not significantly better which is the only issue currently. I do not see any signs of active infection locally or systemically which is great news. No fevers, chills, nausea, vomiting, or diarrhea. She does have her appointment on Monday with her neurosurgeon and we will see you Dr. Christella Noa has to say at that point. 06-18-2022 upon evaluation patient appears to be doing a little worse in regard to her wound only in the respect that has been several days since she has had this changed in fact it was last changed on Friday. Since that time she tells me that she has kept this in place over the weekend and yesterday she was supposed to see her neurosurgeon yesterday but they had to cancel due to an emergency surgery according to what the patient tells me. Nonetheless she is having some issues here with drainage coming from the wound that is more purulent in nature I think this is something we have been seeing in the [facility] for several weeks but is more obvious being that it was  not changed as much during the last week. She unfortunately is a little worried about this but again I think that this is just part of what we have been dealing with all long is more prevalent and obvious due to the length of time since it was changed last. She does have a repeat appointment for I believe July 10 she told me although she is going to see if she can find anything sooner. 06-25-2022 upon evaluation today patient's wound actually showed signs of marginal improvement which is good news. She still has the area of abscess that seems to be at roughly 2:00 when looking at her back. We can get into this with the skinny probe other than that I really do not know what else I can do to try to make this better. We discussed this and she does have an appointment with her neurosurgeon although that is not till mid July as he had to change it at the last moment during the time she was post to see him a week ago. She does have evidence of potential infection on culture I am going to go ahead and treat this in order to ensure that nothing worsens. With that being said I do believe that overall she seems to be doing well but I do think we want to make sure that that the knee gets worse in the meantime until she gets to see her neurosurgeon. 07-01-2022 upon evaluation today patient appears to continue to have trouble with her wound draining. Again we have been continue to monitor this and I still see the same issue that I  have noted before the patient has an area which is draining seemingly from around the 2:00 location when you go up towards 12:00 and then over towards 2:00 looking at her back this is where there is actually a soft area external that if you push on it you will see fluid come out and subsequently this is also where it tracks to internally. Everything in the 6:00 location has completely healed in and this looks much better in that regard but this upper portion we just cannot get to closed  with the methods that we have. It seemed to me that this is probably going to require that this area be open and cleaned out in order to allow it to heal and my suggestion would be that a wound VAC is probably going to be the ideal thing. 07-09-2022 upon evaluation today patient appears to be doing somewhat poorly in regard to her wound. She did see Dr. Christella Noa. He stated that the wound was not being "packed appropriately" according to the patient and her daughter who were present during the evaluation today as well as the evaluation with him yesterday. With that being said he packed the wound the way he said it needed to be and to be partly honest that is over packed. The patient is having a tremendous amount of discomfort and states that this is no way that she is good to be able to tolerate that. I understand the concern about the whole closing down which is why I recommended trying to keep this open is much as possible and again with that all being said this is still going to continue to be an issue as far as this closing down before everything heals up on the inside. Nonetheless she continues to have issues with significant drainage. She also has an area which almost feels like a small abscess or least of fluid pocket that feels up that was not appropriately packed with the dressings that were in place. With that being said I think that this is going to have to be opened at some point in time in order to get this to heal I am really not certain the best way to go about this. I discussed that with the patient again today. LIBA, SIBERT ([id number]) 07-16-2022 upon evaluation today patient appears to be doing well currently in regard to her wound from the standpoint of the pain getting better which is great news. Fortunately I do not see any evidence of active infection locally or systemically which is great news. No fevers, chills, nausea, vomiting, or diarrhea. With that being said the  patient has been tolerating the dressing changes better without complication which is great news. 07-29-2022 upon evaluation today patient appears to be doing actually pretty well in regard to her wound. Fortunately there does not appear to be any signs of infection. She still has drainage and we still are seeing an area from this pocket up at 12:00 that is leaking but fortunately this does not appear to be having any significant issues with infection right now which is great news. Fortunately her pain is also calm down since I saw her 2 weeks ago. 08-05-2022 upon evaluation today patient appears to be doing unfortunately a little worse in regard to the discomfort she is feeling she tells me that this is bothering her pretty much all the time at a low level. It never seems to go away. This is since last week. With that being said the overall depth  is unfortunately a little bit deeper this week which is definitely not what I was hoping to see. I did get to review the second opinion that was requested by the patient from her insurance to have an independent physician review the situation here. The short story of that reviewed which I did read through the entirety of she states that the patient does likely need surgery to correct the seroma considering length of time this has been going on. It was recommended that a plastic surgeon be the one to have this up in case there is a muscle flap necessary but at minimum that this area needs to be cleaned out and then appropriately close following. The patient has also read through this. She does seem to be a little bit off of her normal game not feeling quite that well today. She is not exactly sure what is going on. 08-12-2022 upon evaluation today patient's wound is actually showing signs of maintaining stability which is good news. Fortunately there does not appear to be any evidence of active infection locally or systemically at this time which is excellent.  With that being said she does have a meeting with her nurse with [place] [facility] on Thursday to discuss options for plastic surgery where she is able to go. 08-19-2022 upon evaluation today patient appears to be doing well currently in regard to her wound all things considered. Unfortunately the patient is still continuing to have some issues here with an ongoing open wound in the back despite everything that we have tried she has had an independent review which recommended surgical intervention. Where the process of trying to find someone to take her on as a patient. The suggestion was for plastic surgery which I think is probably a good option here. With that being said she has been looking through her insurance to see who was in network for her and has found someone that I think will be very good. Working to see about making that referral to them. 08-26-2022 upon evaluation today patient appears to be doing well currently in regard to her wound. She tells me she is having some discomfort fortunately there does not appear to be any evidence of active infection at this time which is great news. We have actually gotten her an appointment finally with the plastic surgeon that is 4 I believe it is September 26. 09-03-2022 upon evaluation today patient appears to be doing well currently in regard to her wound everything appears to be stable which is good in that regard. With that being said the drainage does appear to be a little more thick compared to normal she has been having a lot of irritation as well. I do believe that it may be at the point that the repeating the Augmentin that she is previously done well with in the past could be of benefit for her here currently. The patient voiced understanding she has not had this since September 27 the last time I prescribed this for her. Nonetheless it did do a very good job to calm things down quite a bit back at that  time. Objective Constitutional Well-nourished and well-hydrated in no acute distress. Vitals Time Taken: 2:00 PM, Height: 58 in, Weight: 275 lbs, BMI: 57.5, Temperature: 98.3 F, Pulse: 75 bpm, Respiratory Rate: 18 breaths/min, Blood Pressure: 115/71 mmHg. Respiratory normal breathing without difficulty. Psychiatric this patient is able to make decisions and demonstrates good insight into disease process. Alert and Oriented x 3. pleasant and cooperative. General  Notes: Upon inspection patient's wound bed actually showed signs of good granulation and epithelization at this point. Fortunately I do not see any evidence of active infection locally or systemically which is great news and overall I am extremely pleased with where we stand today. No fevers, chills, nausea, vomiting, or diarrhea. Integumentary (Hair, Skin) Wound #1 status is Open. Original cause of wound was Surgical Injury. The date acquired was: 04/11/2021. The wound has been in treatment 46 weeks. The wound is located on the [place] Back. The wound measures 0.3cm length x 0.3cm width x 2.5cm depth; 0.071cm^2 area and 0.177cm^3 volume. There is Fat Layer (Subcutaneous Tissue) exposed. There is no tunneling or undermining noted. There is a medium amount of serosanguineous drainage noted. There is large (67-100%) red granulation within the wound bed. There is no necrotic tissue within the wound bed. Cham, Anavi C. 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 Nurse Visit as needed Home Health: [place]: - [place] for wound care. May utilize formulary equivalent dressing for wound treatment orders unless otherwise specified.  Home Health Nurse may visit PR[facility] to address patient s wound care needs. - Monday and Friday [facility] fax [phone] Bathing/ Shower/ Hygiene: May shower; gently cleanse wound with antibacterial soap, rinse and pat dry prior to dressing wounds No tub bath. Anesthetic (Use 'Patient Medications' Section for Anesthetic Order Entry): Lidocaine applied to wound bed Edema Control - Lymphedema / Segmental Compressive Device / Other: Elevate, Exercise Daily and Avoid Standing for Long Periods of Time. Elevate legs to the level of the heart and pump ankles as often as possible Elevate leg(s) parallel to the floor when sitting. Additional Orders / Instructions: Follow Nutritious Diet and Increase Protein Intake The following medication(s) was prescribed: amoxicillin-pot clavulanate oral 875 mg-125 mg tablet 1 1 tablet oral taken 2 times per day for 14 days starting 09/03/2022 WOUND #1: - Back Wound Laterality: Midline, Distal Cleanser: Normal Saline 3 x Per Week/30 Days Discharge Instructions: Wash your hands with soap and water. Remove old dressing, discard into plastic bag and place into trash. Cleanse the wound with Normal Saline prior to applying a clean dressing using gauze sponges, not tissues or cotton balls. Do not scrub or use excessive force. Pat dry using gauze sponges, not tissue or cotton balls. Primary Dressing: Hydrofera Blue Rope 3 x Per Week/30 Days Discharge Instructions: cut into fourths; angle the end Secondary Dressing: (BORDER) Zetuvit Plus SILICONE BORDER Dressing 4x4 (in/in) 3 x Per Week/30 Days 1. I am going to recommend that we go ahead and continue with the wound care measures as before and the patient is in agreement with plan. This includes the use of the Hydrofera Blue rope which I do believe is doing well. 2. I am also can recommend that we have the patient continue to monitor for any signs of worsening or infection. Obviously if anything changes she should let me know as  soon as possible. I am going to however send in a prescription for the Augmentin which I think is the right thing to do. She has done well with this in the past as far as toleration is concerned and to be honest it is really helped to calm things down when this was somewhat irritated. I feel like there could potentially be some infection  going on due to the thicker drainage were seen versus the thinner blue- tinged drainage that we have typically seen. The patient is in agreement with this plan. We will see patient back for reevaluation in 1 week here in the clinic. If anything worsens or changes patient will contact our office for additional recommendations. Electronic Signature(s) Signed: 09/03/2022 2:33:19 PM By: Worthy Keeler PA-C Entered By: Worthy Keeler on 09/03/2022 14:33:18 Berns, [name] ([id number]) -------------------------------------------------------------------------------- SuperBill Details Patient Name: Moscato, Parveen C. Date of Service: 09/03/2022 Medical Record Number: [id number] Patient Account Number: [id number] Date of Birth/Sex: July 05, 1961 (61 y.o. 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 G35 Multiple sclerosis I10 Essential (primary) hypertension Facility Procedures CPT4 Code: [id number] Description: [phone] - WOUND CARE VISIT-LEV 2 EST PT Modifier: Quantity: 1 Physician Procedures CPT4 Code: [id number] Description: [id number] - WC PHYS LEVEL 4 - EST PT Modifier: Quantity: 1 CPT4 Code: Description: ICD-10 Diagnosis Description T81.31XA Disruption of external operation (surgical) wound, not elsewhere classifi L98.422  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

## 2022-09-03 NOTE — Progress Notes (Addendum)
[name] ([id number]) Visit Report for 09/03/2022 Arrival Information Details Patient Name: [name], [name] Date of Service: 09/03/2022 2:00 PM Medical Record Number: [id number] Patient Account Number: [id number] Date of Birth/Sex: 03-09-1961 (60 y.o. F) Treating RN: Carlene Coria Primary Care Mauriana Dann: Card, Jenny Reichmann Other Clinician: Referring Jahson Emanuele: Card, John Treating Zan Orlick/Extender: Skipper Cliche in Treatment: 76 Visit Information History Since Last Visit All ordered tests and consults were completed: No Patient Arrived: [name] [name] or deleted any medications: No Arrival Time: 13:54 Any new allergies or adverse reactions: No Accompanied By: self Had a fall or experienced change in No Transfer Assistance: None activities of daily living that may affect Patient Identification Verified: Yes risk of falls: Secondary Verification Process Completed: Yes Signs or symptoms of abuse/neglect since last visito No Patient Requires Transmission-Based Precautions: No Hospitalized since last visit: No Patient Has Alerts: No Implantable device outside of the clinic excluding No cellular tissue based products placed in the center since last visit: Has Dressing in Place as Prescribed: Yes Pain Present Now: Yes Electronic Signature(s) Signed: 09/09/2022 8:13:04 AM By: Carlene Coria RN Entered By: Carlene Coria on 09/03/2022 13:59:59 [name], [name] ([id number]) -------------------------------------------------------------------------------- Clinic Level of Care Assessment Details Patient Name: [name] Date of Service: 09/03/2022 2:00 PM Medical Record Number: [id number] Patient Account Number: [id number] Date of Birth/Sex: [birth year] (61 y.o. F) Treating RN: Carlene Coria Primary Care Pamelia Botto: Card, Jenny Reichmann Other Clinician: Referring Genessa Beman: Card, John Treating Ilijah Doucet/Extender: Skipper Cliche in Treatment: 7 Clinic Level of Care Assessment Items TOOL 4  Quantity Score X - Use when only an EandM is performed on FOLLOW-UP visit 1 0 ASSESSMENTS - Nursing Assessment / Reassessment X - Reassessment of Co-morbidities (includes updates in patient status) 1 10 X- 1 5 Reassessment of Adherence to Treatment Plan ASSESSMENTS - Wound and Skin Assessment / Reassessment X - Simple Wound Assessment / Reassessment - one wound 1 5 []  - 0 Complex Wound Assessment / Reassessment - multiple wounds []  - 0 Dermatologic / Skin Assessment (not related to wound area) ASSESSMENTS - Focused Assessment []  - Circumferential Edema Measurements - multi extremities 0 []  - 0 Nutritional Assessment / Counseling / Intervention []  - 0 Lower Extremity Assessment (monofilament, tuning fork, pulses) []  - 0 Peripheral Arterial Disease Assessment (using hand held doppler) ASSESSMENTS - Ostomy and/or Continence Assessment and Care []  - Incontinence Assessment and Management 0 []  - 0 Ostomy Care Assessment and Management (repouching, etc.) PROCESS - Coordination of Care X - Simple Patient / Family Education for ongoing care 1 15 []  - 0 Complex (extensive) Patient / Family Education for ongoing care []  - 0 Staff obtains Programmer, systems, Records, Test Results / Process Orders []  - 0 Staff telephones HHA, Nursing Homes / Clarify orders / etc []  - 0 Routine Transfer to another Facility (non-emergent condition) []  - 0 Routine Hospital Admission (non-emergent condition) []  - 0 New Admissions / Biomedical engineer / Ordering NPWT, Apligraf, etc. []  - 0 Emergency Hospital Admission (emergent condition) X- 1 10 Simple Discharge Coordination []  - 0 Complex (extensive) Discharge Coordination PROCESS - Special Needs []  - Pediatric / Minor Patient Management 0 []  - 0 Isolation Patient Management []  - 0 Hearing / Language / Visual special needs []  - 0 Assessment of Community assistance (transportation, D/[name] planning, etc.) []  - 0 Additional assistance / Altered  mentation []  - 0 Support Surface(s) Assessment (bed, cushion, seat, etc.) INTERVENTIONS - Wound Cleansing / Measurement [name], [name] ([id number]) X- 1 5 Simple Wound Cleansing -  one wound []  - 0 Complex Wound Cleansing - multiple wounds X- 1 5 Wound Imaging (photographs - any number of wounds) []  - 0 Wound Tracing (instead of photographs) X- 1 5 Simple Wound Measurement - one wound []  - 0 Complex Wound Measurement - multiple wounds INTERVENTIONS - Wound Dressings []  - Small Wound Dressing one or multiple wounds 0 []  - 0 Medium Wound Dressing one or multiple wounds []  - 0 Large Wound Dressing one or multiple wounds []  - 0 Application of Medications - topical []  - 0 Application of Medications - injection INTERVENTIONS - Miscellaneous []  - External ear exam 0 []  - 0 Specimen Collection (cultures, biopsies, blood, body fluids, etc.) []  - 0 Specimen(s) / Culture(s) sent or taken to Lab for analysis []  - 0 Patient Transfer (multiple staff / / Similar devices) []  - 0 Simple Staple / Suture removal (25 or less) []  - 0 Complex Staple / Suture removal (26 or more) []  - 0 Hypo / Hyperglycemic Management (close monitor of Blood Glucose) []  - 0 Ankle / Brachial Index (ABI) - do not check if billed separately X- 1 5 Vital Signs Has the patient been seen at the hospital within the last three years: Yes Total Score: 65 Level Of Care: New/Established - Level 2 Electronic Signature(s) Signed: 09/09/2022 8:13:04 AM By: RN Entered By: on 09/03/2022 14:21:34 [name], [name] ( ) -------------------------------------------------------------------------------- Encounter Discharge Information Details Patient Name: [name] Date of Service: 09/03/2022 2:00 PM Medical Record Number: Patient Account Number: Date of Birth/Sex: [birth year] (61 y.o. F) Treating RN: Nurse, adult Primary Care Jak Haggar: Other Clinician: Referring Raul Winterhalter: Card, John Treating Beila Purdie/Extender: in Treatment: 57 Encounter Discharge Information Items Discharge Condition: Stable Ambulatory Status: Walker Discharge Destination: Home Transportation: Private Auto Accompanied By: self Schedule Follow-up Appointment: Yes Clinical Summary of Care: Electronic Signature(s) Signed: 09/09/2022 8:13:04 AM By: 11/09/2022 RN Entered By: Yevonne [name] on 09/03/2022 14:22:31 [name], [name] (11/03/2022) -------------------------------------------------------------------------------- Lower Extremity Assessment Details Patient Name: Spinnato, Reesa [name]. Date of Service: 09/03/2022 2:00 PM Medical Record Number: [id number] Patient Account Number: 11/03/2022 Date of Birth/Sex: 1961/06/03 (61 y.o. F) Treating RN: 09/11/1961 Primary Care Rosaleah Person: 64 Other Clinician: Referring Coby Antrobus: Card, John Treating Deatrice Spanbauer/Extender: Yevonne [name] in Treatment: 46 Electronic Signature(s) Signed: 09/09/2022 8:13:04 AM By: Rowan Blase RN Entered By: 49 on 09/03/2022 14:02:14 [name], [name] (Yevonne [name]) -------------------------------------------------------------------------------- Multi Wound Chart Details Patient Name: Heims, Dajanee [name]. Date of Service: 09/03/2022 2:00 PM Medical Record Number: [id number] Patient Account Number: 11/03/2022 Date of Birth/Sex: [birth year] (61 y.o. F) Treating RN: 09/11/1961 Primary Care Zonnique Norkus: 64 Other Clinician: Referring Derryck Shahan: Card, John Treating Mishti Swanton/Extender: Yevonne [name] in Treatment: 46 Vital Signs Height(in): 58 Pulse(bpm): 75 Weight(lbs): 275 Blood Pressure(mmHg): 115/71 Body Mass Index(BMI): 57.5 Temperature(F): 98.3 Respiratory Rate(breaths/min): 18 Photos: [N/A:N/A] Wound Location: Distal, Midline Back N/A N/A Wounding Event: Surgical Injury N/A N/A Primary Etiology: Dehisced Wound N/A  N/A Comorbid History: Hypertension N/A N/A Date Acquired: 04/11/2021 N/A N/A Weeks of Treatment: 46 N/A N/A Wound Status: Open N/A N/A Wound Recurrence: No N/A N/A Measurements L x W x D (cm) 0.3x0.3x2.5 N/A N/A Area (cm) : 0.071 N/A N/A Volume (cm) : 0.177 N/A N/A % Reduction in Area: 43.70% N/A N/A % Reduction in Volume: 69.40% N/A N/A Classification: Full Thickness Without Exposed N/A N/A Support Structures Exudate Amount: Medium N/A N/A Exudate Type: Serosanguineous N/A N/A Exudate Color:  red, brown N/A N/A Granulation Amount: Large (67-100%) N/A N/A Granulation Quality: Red N/A N/A Necrotic Amount: None Present (0%) N/A N/A Exposed Structures: Fat Layer (Subcutaneous Tissue): N/A N/A Yes Fascia: No Tendon: No Muscle: No Joint: No Bone: No Epithelialization: None N/A N/A Treatment Notes Electronic Signature(s) Signed: 09/09/2022 8:13:04 AM By: Yevonne Pax RN Entered By: Yevonne [name] on 09/03/2022 14:20:19 [name], [name] ([id number]) -------------------------------------------------------------------------------- Multi-Disciplinary Care Plan Details Patient Name: Collymore, Anapaula [name]. Date of Service: 09/03/2022 2:00 PM Medical Record Number: [id number] Patient Account Number: [id number] Date of Birth/Sex: [birth year] (61 y.o. F) Treating RN: Yevonne [name] Primary Care Jermani Pund: [name] Other Clinician: Referring Avyaan Summer: Card, John Treating Samiyyah Moffa/Extender: Rowan Blase in Treatment: 68 Active Inactive Wound/Skin Impairment Nursing Diagnoses: Knowledge deficit related to ulceration/compromised skin integrity Goals: Patient/caregiver will verbalize understanding of skin care regimen Date Initiated: 10/15/2021 Target Resolution Date: 08/09/2022 Goal Status: Active Ulcer/skin breakdown will have a volume reduction of 30% by week 4 Date Initiated: 10/15/2021 Date Inactivated: 01/29/2022 Target Resolution Date: 12/15/2021 Goal Status: Unmet Unmet Reason:  comorbities Ulcer/skin breakdown will have a volume reduction of 50% by week 8 Date Initiated: 10/15/2021 Date Inactivated: 01/29/2022 Target Resolution Date: 01/15/2022 Goal Status: Unmet Unmet Reason: comorbities Ulcer/skin breakdown will have a volume reduction of 80% by week 12 Date Initiated: 10/15/2021 Date Inactivated: 02/19/2022 Target Resolution Date: 02/15/2022 Goal Status: Unmet Unmet Reason: comorbities Ulcer/skin breakdown will heal within 14 weeks Date Initiated: 10/15/2021 Date Inactivated: 05/07/2022 Target Resolution Date: 03/15/2022 Goal Status: Unmet Unmet Reason: comorbities Interventions: Assess patient/caregiver ability to obtain necessary supplies Assess patient/caregiver ability to perform ulcer/skin care regimen upon admission and as needed Assess ulceration(s) every visit Notes: Electronic Signature(s) Signed: 09/09/2022 8:13:04 AM By: Yevonne Pax RN Entered By: Yevonne [name] on 09/03/2022 14:02:18 [name], [name] ([id number]) -------------------------------------------------------------------------------- Pain Assessment Details Patient Name: Conry, Salome [name]. Date of Service: 09/03/2022 2:00 PM Medical Record Number: [id number] Patient Account Number: [id number] Date of Birth/Sex: June 25, 1961 (61 y.o. F) Treating RN: Yevonne [name] Primary Care Rashanna Christiana: [name] Other Clinician: Referring Tamsen Reist: Card, John Treating Zealand Boyett/Extender: Rowan Blase in Treatment: 14 Active Problems Location of Pain Severity and Description of Pain Patient Has Paino Yes Site Locations With Dressing Change: Yes Duration of the Pain. Constant / Intermittento Constant Rate the pain. Current Pain Level: 5 Worst Pain Level: 8 Least Pain Level: 2 Tolerable Pain Level: 5 Character of Pain Describe the Pain: Aching, Burning Pain Management and Medication Current Pain Management: Medication: Yes Cold Application: No Rest: Yes Massage: No Activity:  No T.E.N.S.: No Heat Application: No Leg drop or elevation: No Is the Current Pain Management Adequate: Inadequate How does your wound impact your activities of daily livingo Sleep: Yes Bathing: No Appetite: No Relationship With Others: No Bladder Continence: No Emotions: No Bowel Continence: No Work: No Toileting: No Drive: No Dressing: No Hobbies: No Electronic Signature(s) Signed: 09/09/2022 8:13:04 AM By: Yevonne Pax RN Entered By: Yevonne [name] on 09/03/2022 14:01:37 [name], [name] ([id number]) -------------------------------------------------------------------------------- Patient/Caregiver Education Details Patient Name: [name], [name] Date of Service: 09/03/2022 2:00 PM Medical Record Number: [id number] Patient Account Number: [id number] Date of Birth/Gender: [birth year] (61 y.o. F) Treating RN: Yevonne [name] Primary Care Physician: [name] Other Clinician: Referring Physician: Card, John Treating Physician/Extender: Rowan Blase in Treatment: 50 Education Assessment Education Provided To: Patient Education Topics Provided Wound/Skin Impairment: Methods: Explain/Verbal Responses: State content correctly Electronic Signature(s) Signed: 09/09/2022 8:13:04 AM By: Yevonne Pax RN Entered By: Yevonne [name] on 09/03/2022 14:21:52 [name],  Babita [name]. ([id number]) -------------------------------------------------------------------------------- Wound Assessment Details Patient Name: Fissel, Elzada [name]. Date of Service: 09/03/2022 2:00 PM Medical Record Number: [id number] Patient Account Number: [id number] Date of Birth/Sex: Dec 28, 1961 (61 y.o. F) Treating RN: Carlene Coria Primary Care Swathi Dauphin: Card, Jenny Reichmann Other Clinician: Referring Luciano Cinquemani: Card, John Treating Jaquarious Grey/Extender: Skipper Cliche in Treatment: 46 Wound Status Wound Number: 1 Primary Etiology: Dehisced Wound Wound Location: Distal, Midline Back Wound Status: Open Wounding Event: Surgical  Injury Comorbid History: Hypertension Date Acquired: 04/11/2021 Weeks Of Treatment: 46 Clustered Wound: No Photos Wound Measurements Length: (cm) 0.3 Width: (cm) 0.3 Depth: (cm) 2.5 Area: (cm) 0.071 Volume: (cm) 0.177 % Reduction in Area: 43.7% % Reduction in Volume: 69.4% Epithelialization: None Tunneling: No Undermining: Yes Starting Position (o'clock): 9 Ending Position (o'clock): 3 Maximum Distance: (cm) 3.2 Wound Description Classification: Full Thickness Without Exposed Support Structu Exudate Amount: Medium Exudate Type: Serosanguineous Exudate Color: red, brown res Foul Odor After Cleansing: No Slough/Fibrino No Wound Bed Granulation Amount: Large (67-100%) Exposed Structure Granulation Quality: Red Fascia Exposed: No Necrotic Amount: None Present (0%) Fat Layer (Subcutaneous Tissue) Exposed: Yes Tendon Exposed: No Muscle Exposed: No Joint Exposed: No Bone Exposed: No Electronic Signature(s) Signed: 09/16/2022 2:28:26 PM By: Carlene Coria RN Previous Signature: 09/16/2022 2:25:14 PM Version By: Carlene Coria RN Previous Signature: 09/09/2022 8:13:04 AM Version By: Carlene Coria RN Entered By: Carlene Coria on 09/16/2022 14:28:25 Arps, Madisin [name]. ([id number]) Albrecht, Zaryah [name]. ([id number]) -------------------------------------------------------------------------------- Vitals Details Patient Name: Shaddix, Eimi [name]. Date of Service: 09/03/2022 2:00 PM Medical Record Number: [id number] Patient Account Number: [id number] Date of Birth/Sex: 29-Apr-1961 (61 y.o. F) Treating RN: Carlene Coria Primary Care Ginia Rudell: Clayborn Heron Other Clinician: Referring Asami Lambright: Card, John Treating Maureena Dabbs/Extender: Skipper Cliche in Treatment: 46 Vital Signs Time Taken: 14:00 Temperature (F): 98.3 Height (in): 58 Pulse (bpm): 75 Weight (lbs): 275 Respiratory Rate (breaths/min): 18 Body Mass Index (BMI): 57.5 Blood Pressure (mmHg): 115/71 Reference Range: 80 - 120 mg /  dl Electronic Signature(s) Signed: 09/09/2022 8:13:04 AM By: Carlene Coria RN Entered By: Carlene Coria on 09/03/2022 14:00:36

## 2022-09-09 ENCOUNTER — Encounter: Payer: 59 | Admitting: Physician Assistant

## 2022-09-09 DIAGNOSIS — T8131XA Disruption of external operation (surgical) wound, not elsewhere classified, initial encounter: Secondary | ICD-10-CM | POA: Diagnosis not present

## 2022-09-09 NOTE — Progress Notes (Signed)
MAVEN, ROSANDER ([id number]) Visit Report for 09/09/2022 Chief Complaint Document Details Patient Name: Lisowski, Tabor C. Date of Service: 09/09/2022 2:00 PM Medical Record Number: [id number] Patient Account Number: [id number] Date of Birth/Sex: 1961/02/20 (61 y.o. F) Treating RN: Yevonne Pax Primary Care Provider: [name] Other Clinician: Referring Provider: Card, John Treating Provider/Extender: Rowan Blase in Treatment: 67 Information Obtained from: Patient Chief Complaint Surgical Back Ulcer Electronic Signature(s) Signed: 09/09/2022 2:10:14 PM By: [name] PA-C Entered By: [name] on 09/09/2022 14:10:14 Sesma, Keshia CMarland Kitchen ([id number]) -------------------------------------------------------------------------------- HPI Details Patient Name: Klingensmith, Bibi C. Date of Service: 09/09/2022 2:00 PM Medical Record Number: [id number] Patient Account Number: [id number] Date of Birth/Sex: 05-Jul-1961 (61 y.o. 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 This was actually broken up into 3 separate surgical events. The initial surgical intervention actually was on November 05, 2021 almost a year ago. Subsequently the patient went back in February for a seroma of the area which unfortunately required her to have a repeat surgery to go in and clean this out. And then again this occurred in April where she went back in and again they felt like stitches were coming out and there was an additional seroma. She was placed in a wound VAC initially and then subsequently as it got smaller that  was discontinued. Again right now I will see anything that I think a wound VAC would help with. Nonetheless she definitely has a significant depth to the wound that is going require packing. I actually believe the Hydrofera Blue rope would probably do quite well with this the problem is as much as it is draining she probably needs this to be changed at least every day. She does not really have anyone that can help with that that is the complicating scenario here. With that being said the patient does have a history of multiple sclerosis, hypertension, and again this surgical wound dehiscence in regard to her lumbar spine region. She did have a repeat MRI which was actually completed 10/09/2021. This showed that there was no significant change in the subcutaneous fluid collection/track of the lower lumbar region. This is extending to the level of the fascia unfortunately. This seems to go all the way from the L2 level with a track extending all the way to the fascia at the L4-5 level. Again this is a significant wound and there is significant drainage but does not seem to communicate to the spinal region as far as spinal fluid or otherwise is concerned that is good news. Nonetheless she last saw Dr. Franky Macho who is her neurosurgeon on 10/01/2021 that was when he ordered this last MRI she supposed to see him next week as well. With that being said he did not feel like there was any significant issue there but was not sure why this was not healing that is when he ordered the MRI. They were wanting to make sure that this was packed appropriately by home health unfortunately the main issue currently is that home health is completely out of the picture as the patient has exhausted all the home health that that she gets for a year. She is now in a very difficult  predicament where she does not have anyone to help her change the dressing and to be honest that she is not able to do it herself with the location of  the wound being on the midline lumbar spine region. If she does not have anyone that can help it is probably can to be necessary for her to go to a facility for rehab and daily dressing changes as I feel like daily changes which is much drainage that she is having is going to be necessary. 10/23/2021 upon evaluation today patient appears to be doing decently well in regard to her back ulcer. This does seem to be draining a lot less than what it is been doing in the past. With that being said she still has quite a bit of drainage nonetheless. I do think that given time this should improve least I hope so. The good news is she does have home health coming out 3 days a week were doing it 2 days a week and she is paying someone we can to help. 10/30/2021 upon evaluation today patient appears to be doing okay in regard to her back ulcer this is not draining quite as bad as it was in the beginning but he still has quite a bit of drainage noted. I do believe that the patient would benefit from [place] going ahead forward with attempting a wound VAC using the Hydrofera Blue rope to pack with and then subsequently using the VAC externally to actually suction out and help this to fill- in. I think this is our ideal way to try to get things cleared at this point. As it stands I am not certain that we are really making a progress that we want to see near with doing it in the way we are which is packing with the rope. It is a good dressing but I do think it is insufficient for total healing. She just seems to have too much in the way of drainage at this point unfortunately. 11/06/2021 upon evaluation today patient unfortunately continues to have issues with her back ongoing. The good news is her MRI that was repeated showed signs of the size of this area in the lumbar spine region having decreased from 4 cm to 3.5 cm this is definitely not bad news at all. With that being said unfortunately she continues to have issues  with ongoing drainage not as severe as in the beginning but nonetheless still significant. I do think a wound VAC still would be a good way to go although her home health agency nurse apparently has some concerns about the possibility of not being able to keep a seal with this as they had struggles in the past. Nonetheless I explained to the patient that this is much different than what she had previous and that I really feel like it would do much better as far as getting the area taken care of without having any complications or issues here. I think that we should be able to maintain a seal. Nonetheless at this time I did discuss with the patient as well that she probably does need to have a wound VAC in order for [place] to get this moving in the right direction. 11/13/2021 upon evaluation patient's wound bed actually showed signs of significant drainage at this time. She did see the surgeon yesterday he did not see anything that appeared to be infected. Nonetheless he does appear that she is continuing to have areas here that just do not seem  to want to seal up there MRI findings have been negative but nonetheless she continues to have is the seroma that is filling in. I do feel like we need to try to widen the hole so we can get at least a half of the [facility] then this will be better than nothing at this point. 11/20/2021 upon evaluation today patient actually appears to be having less pain at this point which is good news and overall she we still do not have the results of the culture back yet it had to be sent out to [facility] and we do not have the result back yet. Is doing decently well in regard to her wound. Fortunately there does not appear to be any signs of active infection systemically nor locally at this time. 11/27/2021 upon evaluation today patient appears to be doing well with regard to her wound all things considered there does appear to be less drainage than there was previous.  Fortunately I do not see any evidence of worsening of the patient is stating that she is having some issues with back pain. This is somewhat new. Again this I think could be related to the fact that she is having some issues here with infection. We are still waiting to see what the result of her culture shows from susceptibility testing Enterococcus has been identified but we do not know if this is VRE or not. 12/04/2021 upon evaluation today patient appears to be doing well with regard to her wound all things considered. I did have a conversation with Erin from Dr. Sueanne Margarita office. Of note she notes that Dr. Drinda Butts really does not want to do anything surgical right now which I completely understand. With that being said I am still leery of how far we will make it getting this to heal short of any type of surgery to open this up and allow [place] to more appropriately packed the wound. Nonetheless I will absolutely give it our best shot as far as that is concerned. I discussed that with the patient today. She voiced understanding. The good news is the drainage today seems to be clear it is no longer cloudy as it was previous I am actually very pleased in that regard. SHAQUANTA, HARKLESS ([id number]) 12/11/2021 since have last seen the patient actually did have an conversation with Dr. Franky Macho with her neurosurgeon. Again this involve the discussion around whether or not to open the wound and try to apply a wound VAC following. With that being said the decision was made that that may be the best thing to do if the patient was in agreement. Nonetheless I am actually extremely encouraged with what I am seeing today much more than I would have thought. In fact I think that we may be over packing the wound which is why she is having some discomfort at this point as there is much less of the dressing able to get and this time compared to what we saw previous. That is actually really good news. In fact the 6:00  tunnel appears to have filled in is awesome news. At the 12:00 location I am actually able to pack into that area pretty effectively at this point today. In fact I was able to get less of the packing in which I will detail below. 12/18/2021 upon evaluation today patient appears to be doing well with regard to her wound on the back. Fortunately there is no signs of active infection which is great news and overall  very pleased with where things stand today. No fevers, chills, nausea, vomiting, or diarrhea. 01/01/2022 upon evaluation today patient appears to be doing decently well in regard to her wound. Fortunately there does not appear to be any signs of anything worsening which is great news. She still continues to have issues with drainage but this is not nearly as significant as what it was in the past. There is all clear drainage no signs of purulence noted. 01/08/2022 upon evaluation today patient appears to be doing well with regard to her wound. With that being said she tells me that she has been having some increased pain over the past week. It does appear based on the amount of Hydrofera Blue that we removed this is probably over pack. Again it is difficult to know exactly how old the nurses getting all the skin but nonetheless the length of Hydrofera Blue that was utilized was way too much which may be part of the reason why this felt so uncomfortable. Fortunately I think that we can cut back on that we discussed pieces smaller so hopefully that will not be over packed. 01/22/2021 upon evaluation today patient appears to be doing well With regard to there being no signs of infection at this time. The wound does still tunnel mainly up at the 12:00 location. The depth of the tunnel complete from entry point to the base is about 3.5 cm today. With that being said I do believe that overall she is making good progress this is just a very slow process for her which I know has been frustrating as  well. 01/29/2022 upon evaluation today patient appears to be doing well with regard to the wound on her lower back and the lumbar spine region. The good news is the depth that I got this week was a little bit less going up at the 12:00 location as compared to last week. I measured this to be 3.5 cm last week and it was 3.3 cm this week. Again that is a minute shift but nonetheless a good shift in the right direction. Overall I think that we are on the right track. 02/05/2022 upon evaluation today patient appears to be doing well with regard to her wound. In fact right now her measurements are somewhere around 2.9 cm which is definitely smaller and less deep than last week At the 12:00 location. Overall I am very pleased with what we are seeing. 02/19/2022 upon evaluation today patient unfortunately is noting that the wound is actually closing up externally but internally there is still some depth to this. I discussed with her today that we cannot let it close up like this is can end up being a significant issue for her if we allow for that. Subsequently we need to do what we can to try to open this up and keep it open more effectively. She voiced understanding. With that being said we are going to proceed with that procedure today in order to debride away some of the skin on externally that is trying to close and on this. 02/26/2022 upon evaluation patient appears to be doing better in my opinion after we had to open up this area last week. Again she has a significant amount of healing and overall I am extremely pleased with where things stand currently. I do not see any evidence of active infection locally nor systemically at this time which is great news and in general I think that we are on the right track for getting  this hopefully closed final and done. 03/05/2022 upon evaluation today patient unfortunately is doing a little bit worse with regard to the whole size this is starting to get much  smaller unfortunately. There does not appear to be any signs of active infection locally nor systemically which is great news. With that being said I am concerned about the fact that the patient is doing quite a bit worse with regard to trapping some fluid and almost feels like she may have a fluid pocket that not only goes in and up towards 12:00 but looks back around towards the more superficial subcutaneous tissue. I am very concerned that this is going to be something that is very hard for [place] to heal short of another surgery to open this up and try to wound VAC from the inside out. Even then there is no guarantees this is just a very difficult situation to be perfectly honest. 03/14/2022 upon evaluation today patient appears to be doing well with regard to her wound as far as the area staying open and pleased in that regard. With that being said unfortunately she is not doing as well when it comes to the irritation around it appears to be very inflamed and red this is not good that was discussed with her today as well. Subsequently I do believe that working to need to do what we can do to try and calm this down in the past she did well with the Augmentin due to Enterococcus infection I am not sure if were dealing with the same thing or not but at the same time I think that is probably to be my go to at this point. 03/19/2022 upon evaluation today patient appears to be doing really about the same in regard to her wound that the pain is better. 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 She seems to be improved dramatically with the antibiotic currently although it does not cover for MRSA I am apt to just see how this progresses over the next several days to a week. With that being said the bigger issue here is that we simply do not seem to be making excellent progress and I am concerned about the lack of progress. I discussed with Dr. Shon Baton with [facility] in [place]  and to be honest his recommendation was that the best bet would probably be to get her to a teaching center. She is actually been seen for rheumatology and a I believe psychologist/psychiatrist at [facility]. She would prefer to go that direction as opposed to [facility] or [facility]. I am definitely okay with that. 03/26/2022 upon evaluation today patient appears to be doing okay in regard to her wound. She is not showing signs of worsening and overall she tells me that her pain is not nearly as bad as it was previous. Fortunately I do not see any evidence of active infection locally or systemically which is great news. No fevers, chills, nausea, vomiting, or diarrhea. 04-02-2022 upon evaluation today patient appears to be doing well with regard to her wound. It does not appear to be showing any signs of worsening which is great news. With that being said you are still having some issues here with drainage although it is clear and mainly just tenderness with bleeding from the [facility] I do think that treating for the second issue which is MRSA is probably the right thing to do at this point she is now off of the Augmentin. 04-09-2022 upon  evaluation today patient's wound actually appears to be doing about the same. Fortunately I do not see any evidence of active infection locally or systemically at this time which is great news. No fevers, chills, nausea, vomiting, or diarrhea. 04-16-2022 upon evaluation today patient appears to be doing well with regard to her wound in the lumbar spine region. Subsequently she continues to have an area that does have a tunnel up into the 12:00 location. This is about 3.5 cm using a skinny probe curved upwards to get into this region. With that being said I really do not see any signs of overall improvement but also do not see any signs of worsening I feel like work on about a still made here to some degree. The patient did see her neurologist and he has referred her to  neurosurgery at [facility] for second ULYANA, HARDBARGER. ([id number]) opinion we will see what they have to say as well. 04-23-2022 upon evaluation today patient appears to be doing well with regard to her wound all things considered. She has been tolerating the dressing changes without complication. Fortunately I do not see any signs of active infection locally nor systemically which is great news. No fever chills noted 04-30-2022 upon evaluation today patient appears to be doing about the same in regard to her wound. The depth is really not dramatically improved as far as the 12:00 tunnel is concerned. The overall depth and the base of the wound does seem to be a little bit better it does sound like that external wound has closed as far as the opening is concerned we can have to cut this down from a half to a smaller size based on what we are seeing today. 05-07-2022 upon evaluation today patient's wound actually seems to be doing decently well. There is not a major shift here but a slight shift in the depth both straight and as well as in the [place] at 12:00. With that being said I again we will talk about a couple of millimeters but still every little bit can count when there are situations like this to be honest. 05-14-2022 upon evaluation today patient appears to be doing okay in regard to her wound. I do not see any signs of anything worsening. With that being said also do not see getting significantly better unfortunately. There does not appear to be any evidence of infection locally or systemically which is great news. No fevers, chills, nausea, vomiting, or diarrhea. 05-21-2022 upon evaluation today patient actually appears to be doing quite well in regard to her wound from the standpoint of there being no infection. With that being said there does not appear to be any evidence of infection locally nor systemically which is great news. No fevers, chills, nausea, vomiting, or diarrhea. 05-28-2022  upon evaluation today patient's wound actually appears to be doing about the same. I do not see any evidence of active infection locally or systemically which is great news. No fevers, chills, nausea, vomiting, or diarrhea. 06-04-2022 upon evaluation today patient appears to be doing well with regard to her wound. She has been tolerating dressing changes without complication. Fortunately I do not feel like there is any worsening also I do not feel like there is any improvement. I feel like right a solid area here where she has a tunnel that tracks up at 12:00 and then has a fluid pocket at around roughly 1:00 I can push on this and squeeze fluid out. Again I am not exactly sure  what else can be done from my standpoint to improve this. She does have an appointment with Dr. Christella Noa who I do believe is an excellent surgeon and this is the surgeon who performed this surgery initially. Again the biggest issue was following she had a lot of trouble with the wound VAC I do think that we need to try to see if she is can have a wound VAC following surgery what exactly regular need to do to help keep this moving in the right direction for her. Obviously I want her to have the best result possible that she has to go through surgery again to clean this area out. Absolutely willing to help out with getting this to heal. 06-13-2022 upon evaluation today patient appears to be doing well with regard to her wound its not any worse unfortunately it is also not significantly better which is the only issue currently. I do not see any signs of active infection locally or systemically which is great news. No fevers, chills, nausea, vomiting, or diarrhea. She does have her appointment on Monday with her neurosurgeon and we will see you Dr. Christella Noa has to say at that point. 06-18-2022 upon evaluation patient appears to be doing a little worse in regard to her wound only in the respect that has been several days since she has had  this changed in fact it was last changed on Friday. Since that time she tells me that she has kept this in place over the weekend and yesterday she was supposed to see her neurosurgeon yesterday but they had to cancel due to an emergency surgery according to what the patient tells me. Nonetheless she is having some issues here with drainage coming from the wound that is more purulent in nature I think this is something we have been seeing in the [facility] for several weeks but is more obvious being that it was not changed as much during the last week. She unfortunately is a little worried about this but again I think that this is just part of what we have been dealing with all long is more prevalent and obvious due to the length of time since it was changed last. She does have a repeat appointment for I believe July 10 she told me although she is going to see if she can find anything sooner. 06-25-2022 upon evaluation today patient's wound actually showed signs of marginal improvement which is good news. She still has the area of abscess that seems to be at roughly 2:00 when looking at her back. We can get into this with the skinny probe other than that I really do not know what else I can do to try to make this better. We discussed this and she does have an appointment with her neurosurgeon although that is not till mid July as he had to change it at the last moment during the time she was post to see him a week ago. She does have evidence of potential infection on culture I am going to go ahead and treat this in order to ensure that nothing worsens. With that being said I do believe that overall she seems to be doing well but I do think we want to make sure that that the knee gets worse in the meantime until she gets to see her neurosurgeon. 07-01-2022 upon evaluation today patient appears to continue to have trouble with her wound draining. Again we have been continue to monitor this and I still  see  the same issue that I have noted before the patient has an area which is draining seemingly from around the 2:00 location when you go up towards 12:00 and then over towards 2:00 looking at her back this is where there is actually a soft area external that if you push on it you will see fluid come out and subsequently this is also where it tracks to internally. Everything in the 6:00 location has completely healed in and this looks much better in that regard but this upper portion we just cannot get to closed with the methods that we have. It seemed to me that this is probably going to require that this area be open and cleaned out in order to allow it to heal and my suggestion would be that a wound VAC is probably going to be the ideal thing. 07-09-2022 upon evaluation today patient appears to be doing somewhat poorly in regard to her wound. She did see Dr. Franky Macho. He stated that the wound was not being "packed appropriately" according to the patient and her daughter who were present during the evaluation today as well as the evaluation with him yesterday. With that being said he packed the wound the way he said it needed to be and to be partly honest that is over packed. The patient is having a tremendous amount of discomfort and states that this is no way that she is good to be able to tolerate that. I understand the concern about the whole closing down which is why I recommended trying to keep this open is much as possible and again with that all being said this is still going to continue to be an issue as far as this closing down before everything heals up on the inside. Nonetheless she continues to have issues with significant drainage. She also has an area which almost feels like a small abscess or least of fluid pocket that feels up that was not appropriately packed with the dressings that were in place. With that being said I think that this is going to have to be opened at some point in  time in order to get this to heal I am really not certain the best way to go about this. I discussed that with the patient again today. 07-16-2022 upon evaluation today patient appears to be doing well currently in regard to her wound from the standpoint of the pain getting better which is great news. Fortunately I do not see any evidence of active infection locally or systemically which is great news. No fevers, chills, nausea, vomiting, or diarrhea. With that being said the patient has been tolerating the dressing changes better without complication which is great news. GLADYS, DECKARD ([id number]) 07-29-2022 upon evaluation today patient appears to be doing actually pretty well in regard to her wound. Fortunately there does not appear to be any signs of infection. She still has drainage and we still are seeing an area from this pocket up at 12:00 that is leaking but fortunately this does not appear to be having any significant issues with infection right now which is great news. Fortunately her pain is also calm down since I saw her 2 weeks ago. 08-05-2022 upon evaluation today patient appears to be doing unfortunately a little worse in regard to the discomfort she is feeling she tells me that this is bothering her pretty much all the time at a low level. It never seems to go away. This is since last week. With that being  said the overall depth is unfortunately a little bit deeper this week which is definitely not what I was hoping to see. I did get to review the second opinion that was requested by the patient from her insurance to have an independent physician review the situation here. The short story of that reviewed which I did read through the entirety of she states that the patient does likely need surgery to correct the seroma considering length of time this has been going on. It was recommended that a plastic surgeon be the one to have this up in case there is a muscle flap necessary but at  minimum that this area needs to be cleaned out and then appropriately close following. The patient has also read through this. She does seem to be a little bit off of her normal game not feeling quite that well today. She is not exactly sure what is going on. 08-12-2022 upon evaluation today patient's wound is actually showing signs of maintaining stability which is good news. Fortunately there does not appear to be any evidence of active infection locally or systemically at this time which is excellent. With that being said she does have a meeting with her nurse with [facility] on Thursday to discuss options for plastic surgery where she is able to go. 08-19-2022 upon evaluation today patient appears to be doing well currently in regard to her wound all things considered. Unfortunately the patient is still continuing to have some issues here with an ongoing open wound in the back despite everything that we have tried she has had an independent review which recommended surgical intervention. Where the process of trying to find someone to take her on as a patient. The suggestion was for plastic surgery which I think is probably a good option here. With that being said she has been looking through her insurance to see who was in network for her and has found someone that I think will be very good. Working to see about making that referral to them. 08-26-2022 upon evaluation today patient appears to be doing well currently in regard to her wound. She tells me she is having some discomfort fortunately there does not appear to be any evidence of active infection at this time which is great news. We have actually gotten her an appointment finally with the plastic surgeon that is 4 I believe it is September 26. 09-03-2022 upon evaluation today patient appears to be doing well currently in regard to her wound everything appears to be stable which is good in that regard. With that being said the drainage  does appear to be a little more thick compared to normal she has been having a lot of irritation as well. I do believe that it may be at the point that the repeating the Augmentin that she is previously done well with in the past could be of benefit for her here currently. The patient voiced understanding she has not had this since September 27 the last time I prescribed this for her. Nonetheless it did do a very good job to calm things down quite a bit back at that time. 09-09-2022 upon evaluation today patient appears to be doing well currently in regard to her wound she is stable. There does not appear to be anything worsening at this point. Fortunately I see no signs of active infection at this time. No fevers, chills, nausea, vomiting, or diarrhea. Electronic Signature(s) Signed: 09/09/2022 2:58:15 PM By: [name] PA-C Entered By:  [name] on 09/09/2022 14:58:15 Horlacher, TITA TERHAAR ([id number]) -------------------------------------------------------------------------------- Physical Exam Details Patient Name: Huizinga, Lithzy C. Date of Service: 09/09/2022 2:00 PM Medical Record Number: [id number] Patient Account Number: [id number] Date of Birth/Sex: [birth year] (61 y.o. F) Treating RN: Yevonne Pax Primary Care Provider: [name] Other Clinician: Referring Provider: Card, John Treating Provider/Extender: Rowan Blase in Treatment: 62 Constitutional Well-nourished and well-hydrated in no acute distress. Respiratory normal breathing without difficulty. Psychiatric this patient is able to make decisions and demonstrates good insight into disease process. Alert and Oriented x 3. pleasant and cooperative. Notes Upon inspection patient's wound bed actually showed signs of good granulation and epithelization at this point. Fortunately there does not appear to be any evidence of active infection locally or systemically which is great news and overall I am extremely pleased  with where we stand at this point. I do think however that is still good that she can be seen the plastic surgeon as I think that is going to be what needs to be done definitively to get this wound better. Electronic Signature(s) Signed: 09/09/2022 2:58:45 PM By: [name] PA-C Entered By: [name] on 09/09/2022 14:58:45 Chilton, Domingo Pulse ([id number]) -------------------------------------------------------------------------------- Physician Orders Details Patient Name: Bagby, Anagabriela C. Date of Service: 09/09/2022 2:00 PM Medical Record Number: [id number] Patient Account Number: [id number] Date of Birth/Sex: 01/10/1961 (61 y.o. F) Treating RN: Yevonne Pax Primary Care Provider: [name] Other Clinician: Referring Provider: Card, John Treating Provider/Extender: Rowan Blase in Treatment: 56 Verbal / Phone Orders: No Diagnosis Coding Follow-up Appointments o Return Appointment in 1 week. o Nurse Visit as needed Home Health o Home Health Company: [facility] o [facility] for wound care. May utilize formulary equivalent dressing for wound treatment orders unless otherwise specified. Home Health Nurse may visit PRN to address patientos wound care needs. - Monday and Friday [facility] fax [phone] Bathing/ Shower/ Hygiene o May shower; gently cleanse wound with antibacterial soap, rinse and pat dry prior to dressing wounds o No tub bath. Anesthetic (Use 'Patient Medications' Section for Anesthetic Order Entry) o Lidocaine applied to wound bed Edema Control - Lymphedema / Segmental Compressive Device / Other o Elevate, Exercise Daily and Avoid Standing for Long Periods of Time. o Elevate legs to the level of the heart and pump ankles as often as possible o Elevate leg(s) parallel to the floor when sitting. Additional Orders / Instructions o Follow Nutritious Diet and Increase Protein Intake Wound Treatment Wound #1 - Back Wound  Laterality: Midline, Distal Cleanser: Normal Saline 3 x Per Week/30 Days Discharge Instructions: Wash your hands with soap and water. Remove old dressing, discard into plastic bag and place into trash. Cleanse the wound with Normal Saline prior to applying a clean dressing using gauze sponges, not tissues or cotton balls. Do not scrub or use excessive force. Pat dry using gauze sponges, not tissue or cotton balls. Primary Dressing: Hydrofera Blue Rope 3 x Per Week/30 Days Discharge Instructions: cut into fourths; angle the end Secondary Dressing: (BORDER) Zetuvit Plus SILICONE BORDER Dressing 4x4 (in/in) 3 x Per Week/30 Days Electronic Signature(s) Signed: 09/09/2022 4:14:55 PM By: Yevonne Pax RN Signed: 09/10/2022 5:16:25 PM By: [name] PA-C Entered By: Yevonne Pax on 09/09/2022 14:25:35 Mesa, Everlene C. ([id number]) -------------------------------------------------------------------------------- Problem List Details Patient Name: Favila, Manmeet C. Date of Service: 09/09/2022 2:00 PM Medical Record Number: [id number] Patient Account Number: [id number] Date of Birth/Sex: Mar 29, 1961 (61 y.o. 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  ([id number]) -------------------------------------------------------------------------------- Progress Note Details Patient Name: Kokesh, Alajia C. Date of Service: 09/09/2022 2:00 PM Medical Record Number: [id number] Patient Account Number: [id number] Date of Birth/Sex: 08/04/1961 (61 y.o. 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 This was actually broken up into 3 separate surgical events. The initial surgical intervention actually was on November 05, 2021 almost a year ago. Subsequently the patient went back in February for a seroma of the area which unfortunately required her to have a repeat surgery to go in and clean this out. And then again this occurred in April where she went back in and again they felt like stitches were coming out and there was an additional seroma. She was placed in a wound VAC initially and then subsequently as it got smaller that was discontinued. Again right now I will see anything that I think a wound VAC would help with. Nonetheless she definitely has a significant depth to the wound that is going require packing. I actually believe the Hydrofera Blue rope would probably do quite well with this the problem is as much as it is draining she probably needs this to be changed at least every day. She does not really have anyone that can help with that that is the complicating scenario here. With that being said the patient does have a history of multiple sclerosis, hypertension, and again this surgical wound  dehiscence in regard to her lumbar spine region. She did have a repeat MRI which was actually completed 10/09/2021. This showed that there was no significant change in the subcutaneous fluid collection/track of the lower lumbar region. This is extending to the level of the fascia unfortunately. This seems to go all the way from the L2 level with a track extending all the way to the fascia at the L4-5 level. Again this is a significant wound and there is significant drainage but does not seem to communicate to the spinal region as far as spinal fluid or otherwise is concerned that is good news. Nonetheless she last saw Dr. Franky Macho who is her neurosurgeon on 10/01/2021 that was when he ordered this last MRI she supposed to see him next week as well. With that being said he did not feel like there was any significant issue there but was not sure why this was not healing that is when he ordered the MRI. They were wanting to make sure that this was packed appropriately by home health unfortunately the main issue currently is that home health is completely out of the picture as the patient has exhausted all the home health that that  she gets for a year. She is now in a very difficult predicament where she does not have anyone to help her change the dressing and to be honest that she is not able to do it herself with the location of the wound being on the midline lumbar spine region. If she does not have anyone that can help it is probably can to be necessary for her to go to a facility for rehab and daily dressing changes as I feel like daily changes which is much drainage that she is having is going to be necessary. 10/23/2021 upon evaluation today patient appears to be doing decently well in regard to her back ulcer. This does seem to be draining a lot less than what it is been doing in the past. With that being said she still has quite a bit of drainage nonetheless. I do think that given time this  should improve least I hope so. The good news is she does have home health coming out 3 days a week were doing it 2 days a week and she is paying someone we can to help. 10/30/2021 upon evaluation today patient appears to be doing okay in regard to her back ulcer this is not draining quite as bad as it was in the beginning but he still has quite a bit of drainage noted. I do believe that the patient would benefit from [place] going ahead forward with attempting a wound VAC using the Hydrofera Blue rope to pack with and then subsequently using the VAC externally to actually suction out and help this to fill- in. I think this is our ideal way to try to get things cleared at this point. As it stands I am not certain that we are really making a progress that we want to see near with doing it in the way we are which is packing with the rope. It is a good dressing but I do think it is insufficient for total healing. She just seems to have too much in the way of drainage at this point unfortunately. 11/06/2021 upon evaluation today patient unfortunately continues to have issues with her back ongoing. The good news is her MRI that was repeated showed signs of the size of this area in the lumbar spine region having decreased from 4 cm to 3.5 cm this is definitely not bad news at all. With that being said unfortunately she continues to have issues with ongoing drainage not as severe as in the beginning but nonetheless still significant. I do think a wound VAC still would be a good way to go although her home health agency nurse apparently has some concerns about the possibility of not being able to keep a seal with this as they had struggles in the past. Nonetheless I explained to the patient that this is much different than what she had previous and that I really feel like it would do much better as far as getting the area taken care of without having any complications or issues here. I think that we should be able  to maintain a seal. Nonetheless at this time I did discuss with the patient as well that she probably does need to have a wound VAC in order for [place] to get this moving in the right direction. 11/13/2021 upon evaluation patient's wound bed actually showed signs of significant drainage at this time. She did see the surgeon yesterday he did not see anything that appeared to be infected. Nonetheless he does appear that  she is continuing to have areas here that just do not seem to want to seal up there MRI findings have been negative but nonetheless she continues to have is the seroma that is filling in. I do feel like we need to try to widen the hole so we can get at least a half of the [facility] then this will be better than nothing at this point. 11/20/2021 upon evaluation today patient actually appears to be having less pain at this point which is good news and overall she we still do not have the results of the culture back yet it had to be sent out to [facility] and we do not have the result back yet. Is doing decently well in regard to her wound. Fortunately there does not appear to be any signs of active infection systemically nor locally at this time. 11/27/2021 upon evaluation today patient appears to be doing well with regard to her wound all things considered there does appear to be less drainage than there was previous. Fortunately I do not see any evidence of worsening of the patient is stating that she is having some issues with back pain. This is somewhat new. Again this I think could be related to the fact that she is having some issues here with infection. We are still waiting to see what the result of her culture shows from susceptibility testing Enterococcus has been identified but we do not know if this is VRE or not. 12/04/2021 upon evaluation today patient appears to be doing well with regard to her wound all things considered. I did have a conversation with Erin from Dr. Sueanne Margarita  office. Of note she notes that Dr. Drinda Butts really does not want to do anything surgical right now which I completely Casebier, Youa C. ([id number]) understand. With that being said I am still leery of how far we will make it getting this to heal short of any type of surgery to open this up and allow [place] to more appropriately packed the wound. Nonetheless I will absolutely give it our best shot as far as that is concerned. I discussed that with the patient today. She voiced understanding. The good news is the drainage today seems to be clear it is no longer cloudy as it was previous I am actually very pleased in that regard. 12/11/2021 since have last seen the patient actually did have an conversation with Dr. Franky Macho with her neurosurgeon. Again this involve the discussion around whether or not to open the wound and try to apply a wound VAC following. With that being said the decision was made that that may be the best thing to do if the patient was in agreement. Nonetheless I am actually extremely encouraged with what I am seeing today much more than I would have thought. In fact I think that we may be over packing the wound which is why she is having some discomfort at this point as there is much less of the dressing able to get and this time compared to what we saw previous. That is actually really good news. In fact the 6:00 tunnel appears to have filled in is awesome news. At the 12:00 location I am actually able to pack into that area pretty effectively at this point today. In fact I was able to get less of the packing in which I will detail below. 12/18/2021 upon evaluation today patient appears to be doing well with regard to her wound on the back. Fortunately there is  no signs of active infection which is great news and overall very pleased with where things stand today. No fevers, chills, nausea, vomiting, or diarrhea. 01/01/2022 upon evaluation today patient appears to be doing decently well  in regard to her wound. Fortunately there does not appear to be any signs of anything worsening which is great news. She still continues to have issues with drainage but this is not nearly as significant as what it was in the past. There is all clear drainage no signs of purulence noted. 01/08/2022 upon evaluation today patient appears to be doing well with regard to her wound. With that being said she tells me that she has been having some increased pain over the past week. It does appear based on the amount of Hydrofera Blue that we removed this is probably over pack. Again it is difficult to know exactly how old the nurses getting all the skin but nonetheless the length of Hydrofera Blue that was utilized was way too much which may be part of the reason why this felt so uncomfortable. Fortunately I think that we can cut back on that we discussed pieces smaller so hopefully that will not be over packed. 01/22/2021 upon evaluation today patient appears to be doing well With regard to there being no signs of infection at this time. The wound does still tunnel mainly up at the 12:00 location. The depth of the tunnel complete from entry point to the base is about 3.5 cm today. With that being said I do believe that overall she is making good progress this is just a very slow process for her which I know has been frustrating as well. 01/29/2022 upon evaluation today patient appears to be doing well with regard to the wound on her lower back and the lumbar spine region. The good news is the depth that I got this week was a little bit less going up at the 12:00 location as compared to last week. I measured this to be 3.5 cm last week and it was 3.3 cm this week. Again that is a minute shift but nonetheless a good shift in the right direction. Overall I think that we are on the right track. 02/05/2022 upon evaluation today patient appears to be doing well with regard to her wound. In fact right now her  measurements are somewhere around 2.9 cm which is definitely smaller and less deep than last week At the 12:00 location. Overall I am very pleased with what we are seeing. 02/19/2022 upon evaluation today patient unfortunately is noting that the wound is actually closing up externally but internally there is still some depth to this. I discussed with her today that we cannot let it close up like this is can end up being a significant issue for her if we allow for that. Subsequently we need to do what we can to try to open this up and keep it open more effectively. She voiced understanding. With that being said we are going to proceed with that procedure today in order to debride away some of the skin on externally that is trying to close and on this. 02/26/2022 upon evaluation patient appears to be doing better in my opinion after we had to open up this area last week. Again she has a significant amount of healing and overall I am extremely pleased with where things stand currently. I do not see any evidence of active infection locally nor systemically at this time which is great news and in  general I think that we are on the right track for getting this hopefully closed final and done. 03/05/2022 upon evaluation today patient unfortunately is doing a little bit worse with regard to the whole size this is starting to get much smaller unfortunately. There does not appear to be any signs of active infection locally nor systemically which is great news. With that being said I am concerned about the fact that the patient is doing quite a bit worse with regard to trapping some fluid and almost feels like she may have a fluid pocket that not only goes in and up towards 12:00 but looks back around towards the more superficial subcutaneous tissue. I am very concerned that this is going to be something that is very hard for [place] to heal short of another surgery to open this up and try to wound VAC from the  inside out. Even then there is no guarantees this is just a very difficult situation to be perfectly honest. 03/14/2022 upon evaluation today patient appears to be doing well with regard to her wound as far as the area staying open and pleased in that regard. With that being said unfortunately she is not doing as well when it comes to the irritation around it appears to be very inflamed and red this is not good that was discussed with her today as well. Subsequently I do believe that working to need to do what we can do to try and calm this down in the past she did well with the Augmentin due to Enterococcus infection I am not sure if were dealing with the same thing or not but at the same time I think that is probably to be my go to at this point. 03/19/2022 upon evaluation today patient appears to be doing really about the same in regard to her wound that the pain is better. 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 She seems to be improved dramatically with the antibiotic currently although it does not cover for MRSA I am apt to just see how this progresses over the next several days to a week. With that being said the bigger issue here is that we simply do not seem to be making excellent progress and I am concerned about the lack of progress. I discussed with Dr. Shon Baton with [facility] in [place] and to be honest his recommendation was that the best bet would probably be to get her to a teaching center. She is actually been seen for rheumatology and a I believe psychologist/psychiatrist at [facility]. She would prefer to go that direction as opposed to [facility] or [facility]. I am definitely okay with that. 03/26/2022 upon evaluation today patient appears to be doing okay in regard to her wound. She is not showing signs of worsening and overall she tells me that her pain is not nearly as bad as it was previous. Fortunately I do not see any evidence of active infection locally or  systemically which is great news. No fevers, chills, nausea, vomiting, or diarrhea. 04-02-2022 upon evaluation today patient appears to be doing well with regard to her wound. It does not appear to be showing any signs of worsening which is great news. With that being said you are still having some issues here with drainage although it is clear and mainly just tenderness with bleeding from the [facility] I do think that treating for the second issue which is MRSA is probably the right thing to do  at this point she is now off of the Augmentin. 04-09-2022 upon evaluation today patient's wound actually appears to be doing about the same. Fortunately I do not see any evidence of active infection locally or systemically at this time which is great news. No fevers, chills, nausea, vomiting, or diarrhea. ILANI, OTTERSON ([id number]) 04-16-2022 upon evaluation today patient appears to be doing well with regard to her wound in the lumbar spine region. Subsequently she continues to have an area that does have a tunnel up into the 12:00 location. This is about 3.5 cm using a skinny probe curved upwards to get into this region. With that being said I really do not see any signs of overall improvement but also do not see any signs of worsening I feel like work on about a still made here to some degree. The patient did see her neurologist and he has referred her to neurosurgery at [facility] for second opinion we will see what they have to say as well. 04-23-2022 upon evaluation today patient appears to be doing well with regard to her wound all things considered. She has been tolerating the dressing changes without complication. Fortunately I do not see any signs of active infection locally nor systemically which is great news. No fever chills noted 04-30-2022 upon evaluation today patient appears to be doing about the same in regard to her wound. The depth is really not dramatically improved as far as the 12:00  tunnel is concerned. The overall depth and the base of the wound does seem to be a little bit better it does sound like that external wound has closed as far as the opening is concerned we can have to cut this down from a half to a smaller size based on what we are seeing today. 05-07-2022 upon evaluation today patient's wound actually seems to be doing decently well. There is not a major shift here but a slight shift in the depth both straight and as well as in the [place] at 12:00. With that being said I again we will talk about a couple of millimeters but still every little bit can count when there are situations like this to be honest. 05-14-2022 upon evaluation today patient appears to be doing okay in regard to her wound. I do not see any signs of anything worsening. With that being said also do not see getting significantly better unfortunately. There does not appear to be any evidence of infection locally or systemically which is great news. No fevers, chills, nausea, vomiting, or diarrhea. 05-21-2022 upon evaluation today patient actually appears to be doing quite well in regard to her wound from the standpoint of there being no infection. With that being said there does not appear to be any evidence of infection locally nor systemically which is great news. No fevers, chills, nausea, vomiting, or diarrhea. 05-28-2022 upon evaluation today patient's wound actually appears to be doing about the same. I do not see any evidence of active infection locally or systemically which is great news. No fevers, chills, nausea, vomiting, or diarrhea. 06-04-2022 upon evaluation today patient appears to be doing well with regard to her wound. She has been tolerating dressing changes without complication. Fortunately I do not feel like there is any worsening also I do not feel like there is any improvement. I feel like right a solid area here where she has a tunnel that tracks up at 12:00 and then has a fluid  pocket at around roughly 1:00 I can push  on this and squeeze fluid out. Again I am not exactly sure what else can be done from my standpoint to improve this. She does have an appointment with Dr. Franky Macho who I do believe is an excellent surgeon and this is the surgeon who performed this surgery initially. Again the biggest issue was following she had a lot of trouble with the wound VAC I do think that we need to try to see if she is can have a wound VAC following surgery what exactly regular need to do to help keep this moving in the right direction for her. Obviously I want her to have the best result possible that she has to go through surgery again to clean this area out. Absolutely willing to help out with getting this to heal. 06-13-2022 upon evaluation today patient appears to be doing well with regard to her wound its not any worse unfortunately it is also not significantly better which is the only issue currently. I do not see any signs of active infection locally or systemically which is great news. No fevers, chills, nausea, vomiting, or diarrhea. She does have her appointment on Monday with her neurosurgeon and we will see you Dr. Franky Macho has to say at that point. 06-18-2022 upon evaluation patient appears to be doing a little worse in regard to her wound only in the respect that has been several days since she has had this changed in fact it was last changed on Friday. Since that time she tells me that she has kept this in place over the weekend and yesterday she was supposed to see her neurosurgeon yesterday but they had to cancel due to an emergency surgery according to what the patient tells me. Nonetheless she is having some issues here with drainage coming from the wound that is more purulent in nature I think this is something we have been seeing in the [facility] for several weeks but is more obvious being that it was not changed as much during the last week. She unfortunately is  a little worried about this but again I think that this is just part of what we have been dealing with all long is more prevalent and obvious due to the length of time since it was changed last. She does have a repeat appointment for I believe July 10 she told me although she is going to see if she can find anything sooner. 06-25-2022 upon evaluation today patient's wound actually showed signs of marginal improvement which is good news. She still has the area of abscess that seems to be at roughly 2:00 when looking at her back. We can get into this with the skinny probe other than that I really do not know what else I can do to try to make this better. We discussed this and she does have an appointment with her neurosurgeon although that is not till mid July as he had to change it at the last moment during the time she was post to see him a week ago. She does have evidence of potential infection on culture I am going to go ahead and treat this in order to ensure that nothing worsens. With that being said I do believe that overall she seems to be doing well but I do think we want to make sure that that the knee gets worse in the meantime until she gets to see her neurosurgeon. 07-01-2022 upon evaluation today patient appears to continue to have trouble with her wound draining. Again  we have been continue to monitor this and I still see the same issue that I have noted before the patient has an area which is draining seemingly from around the 2:00 location when you go up towards 12:00 and then over towards 2:00 looking at her back this is where there is actually a soft area external that if you push on it you will see fluid come out and subsequently this is also where it tracks to internally. Everything in the 6:00 location has completely healed in and this looks much better in that regard but this upper portion we just cannot get to closed with the methods that we have. It seemed to me that this is  probably going to require that this area be open and cleaned out in order to allow it to heal and my suggestion would be that a wound VAC is probably going to be the ideal thing. 07-09-2022 upon evaluation today patient appears to be doing somewhat poorly in regard to her wound. She did see Dr. Franky Macho. He stated that the wound was not being "packed appropriately" according to the patient and her daughter who were present during the evaluation today as well as the evaluation with him yesterday. With that being said he packed the wound the way he said it needed to be and to be partly honest that is over packed. The patient is having a tremendous amount of discomfort and states that this is no way that she is good to be able to tolerate that. I understand the concern about the whole closing down which is why I recommended trying to keep this open is much as possible and again with that all being said this is still going to continue to be an issue as far as this closing down before everything heals up on the inside. Nonetheless she continues to have issues with significant drainage. She also has an area which almost feels like a small abscess or least of fluid pocket that feels up that was not appropriately packed with the dressings that were in place. With that being said I think that this is going to have to be opened at some point in time in order to get this to heal I am really not certain the best way to go about this. I discussed that with the patient again today. KOREY, ARROYO ([id number]) 07-16-2022 upon evaluation today patient appears to be doing well currently in regard to her wound from the standpoint of the pain getting better which is great news. Fortunately I do not see any evidence of active infection locally or systemically which is great news. No fevers, chills, nausea, vomiting, or diarrhea. With that being said the patient has been tolerating the dressing changes better without  complication which is great news. 07-29-2022 upon evaluation today patient appears to be doing actually pretty well in regard to her wound. Fortunately there does not appear to be any signs of infection. She still has drainage and we still are seeing an area from this pocket up at 12:00 that is leaking but fortunately this does not appear to be having any significant issues with infection right now which is great news. Fortunately her pain is also calm down since I saw her 2 weeks ago. 08-05-2022 upon evaluation today patient appears to be doing unfortunately a little worse in regard to the discomfort she is feeling she tells me that this is bothering her pretty much all the time at a low level. It never  seems to go away. This is since last week. With that being said the overall depth is unfortunately a little bit deeper this week which is definitely not what I was hoping to see. I did get to review the second opinion that was requested by the patient from her insurance to have an independent physician review the situation here. The short story of that reviewed which I did read through the entirety of she states that the patient does likely need surgery to correct the seroma considering length of time this has been going on. It was recommended that a plastic surgeon be the one to have this up in case there is a muscle flap necessary but at minimum that this area needs to be cleaned out and then appropriately close following. The patient has also read through this. She does seem to be a little bit off of her normal game not feeling quite that well today. She is not exactly sure what is going on. 08-12-2022 upon evaluation today patient's wound is actually showing signs of maintaining stability which is good news. Fortunately there does not appear to be any evidence of active infection locally or systemically at this time which is excellent. With that being said she does have a meeting with her nurse with  [place] [facility] on Thursday to discuss options for plastic surgery where she is able to go. 08-19-2022 upon evaluation today patient appears to be doing well currently in regard to her wound all things considered. Unfortunately the patient is still continuing to have some issues here with an ongoing open wound in the back despite everything that we have tried she has had an independent review which recommended surgical intervention. Where the process of trying to find someone to take her on as a patient. The suggestion was for plastic surgery which I think is probably a good option here. With that being said she has been looking through her insurance to see who was in network for her and has found someone that I think will be very good. Working to see about making that referral to them. 08-26-2022 upon evaluation today patient appears to be doing well currently in regard to her wound. She tells me she is having some discomfort fortunately there does not appear to be any evidence of active infection at this time which is great news. We have actually gotten her an appointment finally with the plastic surgeon that is 4 I believe it is September 26. 09-03-2022 upon evaluation today patient appears to be doing well currently in regard to her wound everything appears to be stable which is good in that regard. With that being said the drainage does appear to be a little more thick compared to normal she has been having a lot of irritation as well. I do believe that it may be at the point that the repeating the Augmentin that she is previously done well with in the past could be of benefit for her here currently. The patient voiced understanding she has not had this since September 27 the last time I prescribed this for her. Nonetheless it did do a very good job to calm things down quite a bit back at that time. 09-09-2022 upon evaluation today patient appears to be doing well currently in regard to her wound she  is stable. There does not appear to be anything worsening at this point. Fortunately I see no signs of active infection at this time. No fevers, chills, nausea, vomiting, or diarrhea. Objective  Constitutional Well-nourished and well-hydrated in no acute distress. Vitals Time Taken: 2:07 PM, Height: 58 in, Weight: 275 lbs, BMI: 57.5, Temperature: 98.2 F, Pulse: 86 bpm, Respiratory Rate: 18 breaths/min, Blood Pressure: 141/77 mmHg. Respiratory normal breathing without difficulty. Psychiatric this patient is able to make decisions and demonstrates good insight into disease process. Alert and Oriented x 3. pleasant and cooperative. General Notes: Upon inspection patient's wound bed actually showed signs of good granulation and epithelization at this point. Fortunately there does not appear to be any evidence of active infection locally or systemically which is great news and overall I am extremely pleased with where we stand at this point. I do think however that is still good that she can be seen the plastic surgeon as I think that is going to be what needs to be done definitively to get this wound better. Integumentary (Hair, Skin) Wound #1 status is Open. Original cause of wound was Surgical Injury. The date acquired was: 04/11/2021. The wound has been in treatment 47 weeks. The wound is located on the Distal,Midline Back. The wound measures 0.3cm length x 0.3cm width x 2.5cm depth; 0.071cm^2 area and 0.177cm^3 volume. There is Fat Layer (Subcutaneous Tissue) exposed. There is no tunneling or undermining noted. There is a medium amount of serosanguineous drainage noted. There is large (67-100%) red granulation within the wound bed. There is no necrotic tissue within the wound bed. Pickerill, Maizee C. 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 Nurse Visit as needed Home Health: Home Health Company: - [facility] for wound care. May utilize formulary equivalent dressing for wound treatment orders unless otherwise specified. Home Health Nurse may visit PRN to address patient s wound care needs. - Monday and Friday [facility] fax [phone] Bathing/ Shower/ Hygiene: May shower; gently cleanse wound with antibacterial soap, rinse and pat dry prior to dressing wounds No tub bath. Anesthetic (Use 'Patient Medications' Section for Anesthetic Order Entry): Lidocaine applied to wound bed Edema Control - Lymphedema / Segmental Compressive Device / Other: Elevate, Exercise Daily and Avoid Standing for Long Periods of Time. Elevate legs to the level of the heart and pump ankles as often as possible Elevate leg(s) parallel to the floor when sitting. Additional Orders / Instructions: Follow Nutritious Diet and Increase Protein Intake WOUND #1: - Back Wound Laterality: Midline, Distal Cleanser: Normal Saline 3 x Per Week/30 Days Discharge Instructions: Wash your hands with soap and water. Remove old dressing, discard into plastic bag and place into trash. Cleanse the wound with Normal Saline prior to applying a clean dressing using gauze sponges, not tissues or cotton balls. Do not scrub or use excessive force. Pat dry using gauze sponges, not tissue or cotton balls. Primary Dressing: Hydrofera Blue Rope 3 x Per Week/30 Days Discharge Instructions: cut into fourths; angle the end Secondary Dressing: (BORDER) Zetuvit Plus SILICONE BORDER Dressing 4x4 (in/in) 3 x Per Week/30 Days 1. Would recommend currently that we go ahead and continue with the wound care measures as before and the patient is in agreement with plan. This includes the use of the Hydrofera Blue rope which I think is still doing as best as  anything can do something to keep an area open so it does not prematurely close up. 2. I am also can recommend that we have the  patient continue with the Zetuvit bordered foam dressing which I think is doing well as far schedule any drainage. 3. We are still waiting on the appointment with the plastic surgeon as soon as that can be completed and we can see what they have to say as far as the treatment plan going forward is concerned. We will see patient back for reevaluation in 1 week here in the clinic. If anything worsens or changes patient will contact our office for additional recommendations. Electronic Signature(s) Signed: 09/09/2022 3:00:22 PM By: [name] PA-C Entered By: [name] on 09/09/2022 15:00:22 Gibby, Domingo Pulse ([id number]) -------------------------------------------------------------------------------- SuperBill Details Patient Name: Ridings, Taleigha C. Date of Service: 09/09/2022 Medical Record Number: [id number] Patient Account Number: [id number] Date of Birth/Sex: October 19, 1961 (61 y.o. 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 G35 Multiple sclerosis I10 Essential (primary) hypertension Facility Procedures CPT4 Code: [id number] Description: 99213 - WOUND CARE VISIT-LEV 3 EST PT Modifier: Quantity: 1 Physician Procedures CPT4 Code: 2297989 Description: 99213 - WC PHYS LEVEL 3 - EST PT Modifier: Quantity: 1 CPT4 Code: Description: ICD-10 Diagnosis Description T81.31XA Disruption of external operation (surgical) wound, not elsewhere classifi 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

## 2022-09-09 NOTE — Progress Notes (Addendum)
[name], [name] ([id number]) Visit Report for 09/09/2022 Arrival Information Details Patient Name: [name], [name] Date of Service: 09/09/2022 2:00 PM Medical Record Number: [id number] Patient Account Number: [id number] Date of Birth/Sex: [birth year] (61 y.o. F) Treating RN: [name] Primary Care [name]: [name], [name] Ruiz Other Clinician: Referring [name]: [name], [name] Treating [name]/Extender: [name] in Treatment: 1 Visit Information History Since Last Visit All ordered tests and consults were completed: No Patient Arrived: [name] Added or deleted any medications: No Arrival Time: 14:05 Any new allergies or adverse reactions: No Accompanied By: self Had a fall or experienced change in No Transfer Assistance: None activities of daily living that may affect Patient Identification Verified: Yes risk of falls: Secondary Verification Process Completed: Yes Signs or symptoms of abuse/neglect since last visito No Patient Requires Transmission-Based Precautions: No Hospitalized since last visit: No Patient Has Alerts: No Implantable device outside of the clinic excluding No cellular tissue based products placed in the center since last visit: Has Dressing in Place as Prescribed: Yes Pain Present Now: No Electronic Signature(s) Signed: 09/09/2022 4:14:55 PM By: [name] Pax RN Entered By: [name] on 09/09/2022 14:07:31 [name], [name] [name] ([id number]) -------------------------------------------------------------------------------- Clinic Level of Care Assessment Details Patient Name: [name], [name] Date of Service: 09/09/2022 2:00 PM Medical Record Number: [id number] Patient Account Number: [id number] Date of Birth/Sex: 08/14/1961 (61 y.o. F) Treating RN: [name] Primary Care [name]: [name], [name] Ruiz Other Clinician: Referring [name]: [name], [name] Treating [name]/Extender: [name] in Treatment: 14 Clinic Level of Care Assessment Items TOOL 4  Quantity Score X - Use when only an EandM is performed on FOLLOW-UP visit 1 0 ASSESSMENTS - Nursing Assessment / Reassessment X - Reassessment of Co-morbidities (includes updates in patient status) 1 10 X- 1 5 Reassessment of Adherence to Treatment Plan ASSESSMENTS - Wound and Skin Assessment / Reassessment X - Simple Wound Assessment / Reassessment - one wound 1 5 []  - 0 Complex Wound Assessment / Reassessment - multiple wounds []  - 0 Dermatologic / Skin Assessment (not related to wound area) ASSESSMENTS - Focused Assessment []  - Circumferential Edema Measurements - multi extremities 0 []  - 0 Nutritional Assessment / Counseling / Intervention []  - 0 Lower Extremity Assessment (monofilament, tuning fork, pulses) []  - 0 Peripheral Arterial Disease Assessment (using hand held doppler) ASSESSMENTS - Ostomy and/or Continence Assessment and Care []  - Incontinence Assessment and Management 0 []  - 0 Ostomy Care Assessment and Management (repouching, etc.) PROCESS - Coordination of Care X - Simple Patient / Family Education for ongoing care 1 15 []  - 0 Complex (extensive) Patient / Family Education for ongoing care X- 1 10 Staff obtains , Records, Test Results / Process Orders []  - 0 Staff telephones HHA, Nursing Homes / Clarify orders / etc []  - 0 Routine Transfer to another Facility (non-emergent condition) []  - 0 Routine Hospital Admission (non-emergent condition) []  - 0 New Admissions / / Ordering NPWT, Apligraf, etc. []  - 0 Emergency Hospital Admission (emergent condition) X- 1 10 Simple Discharge Coordination []  - 0 Complex (extensive) Discharge Coordination PROCESS - Special Needs []  - Pediatric / Minor Patient Management 0 []  - 0 Isolation Patient Management []  - 0 Hearing / Language / Visual special needs []  - 0 Assessment of Community assistance (transportation, D/[name] planning, etc.) []  - 0 Additional assistance / Altered  mentation []  - 0 Support Surface(s) Assessment (bed, cushion, seat, etc.) INTERVENTIONS - Wound Cleansing / Measurement [name] ( ) X- 1 5 Simple Wound Cleansing -  one wound []  - 0 Complex Wound Cleansing - multiple wounds X- 1 5 Wound Imaging (photographs - any number of wounds) []  - 0 Wound Tracing (instead of photographs) X- 1 5 Simple Wound Measurement - one wound []  - 0 Complex Wound Measurement - multiple wounds INTERVENTIONS - Wound Dressings X - Small Wound Dressing one or multiple wounds 1 10 []  - 0 Medium Wound Dressing one or multiple wounds []  - 0 Large Wound Dressing one or multiple wounds []  - 0 Application of Medications - topical []  - 0 Application of Medications - injection INTERVENTIONS - Miscellaneous []  - External ear exam 0 []  - 0 Specimen Collection (cultures, biopsies, blood, body fluids, etc.) []  - 0 Specimen(s) / Culture(s) sent or taken to Lab for analysis []  - 0 Patient Transfer (multiple staff / / Similar devices) []  - 0 Simple Staple / Suture removal (25 or less) []  - 0 Complex Staple / Suture removal (26 or more) []  - 0 Hypo / Hyperglycemic Management (close monitor of Blood Glucose) []  - 0 Ankle / Brachial Index (ABI) - do not check if billed separately X- 1 5 Vital Signs Has the patient been seen at the hospital within the last three years: Yes Total Score: 85 Level Of Care: New/Established - Level 3 Electronic Signature(s) Signed: 09/09/2022 4:14:55 PM By: RN Entered By: on 09/09/2022 14:34:18 [name], [name] ( ) -------------------------------------------------------------------------------- Encounter Discharge Information Details Patient Name: [name] Date of Service: 09/09/2022 2:00 PM Medical Record Number: Patient Account Number: Date of Birth/Sex: 11/18/1961 (61 y.o. F) Treating RN: Primary Care [name]:  Other Clinician: Referring [name]: [name], [name] Treating Rashon Westrup/Extender: in Treatment: 64 Encounter Discharge Information Items Discharge Condition: Stable Ambulatory Status: Walker Discharge Destination: Home Transportation: Private Auto Accompanied By: self Schedule Follow-up Appointment: Yes Clinical Summary of Care: Patient Declined Electronic Signature(s) Signed: 09/09/2022 4:14:55 PM By: [name] Pax RN Entered By: [name] on 09/09/2022 14:35:16 [name], [name] (Marland [name]) -------------------------------------------------------------------------------- Lower Extremity Assessment Details Patient Name: [name], [name] Date of Service: 09/09/2022 2:00 PM Medical Record Number: 11/09/2022 Patient Account Number: [id number] Date of Birth/Sex: 02/09/1961 (61 y.o. F) Treating RN: 64 Primary Care Dynasia Kercheval: [name] Other Clinician: Referring Osha Rane: [name], [name] Treating Billyjack Trompeter/Extender: [name] in Treatment: 15 Electronic Signature(s) Signed: 09/09/2022 4:14:55 PM By: 11/09/2022 RN Entered By: [name] on 09/09/2022 14:09:06 Mielke, Kensli C11/10/2022 ([id number]) -------------------------------------------------------------------------------- Multi Wound Chart Details Patient Name: [name], [name] Date of Service: 09/09/2022 2:00 PM Medical Record Number: [id number] Patient Account Number: [id number] Date of Birth/Sex: 1961-09-27 (61 y.o. F) Treating RN: [name] Primary Care Renton Berkley: [name] Other Clinician: Referring Wing Gfeller: [name], [name] Treating Osborn Pullin/Extender: [name] in Treatment: 34 Vital Signs Height(in): 58 Pulse(bpm): 86 Weight(lbs): 275 Blood Pressure(mmHg): 141/77 Body Mass Index(BMI): 57.5 Temperature(F): 98.2 Respiratory Rate(breaths/min): 18 Photos: [N/A:N/A] Wound Location: Distal, Midline Back N/A N/A Wounding Event: Surgical Injury N/A N/A Primary Etiology:  Dehisced Wound N/A N/A Comorbid History: Hypertension N/A N/A Date Acquired: 04/11/2021 N/A N/A Weeks of Treatment: 47 N/A N/A Wound Status: Open N/A N/A Wound Recurrence: No N/A N/A Measurements L x W x D (cm) 0.3x0.3x2.5 N/A N/A Area (cm) : 0.071 N/A N/A Volume (cm) : 0.177 N/A N/A % Reduction in Area: 43.70% N/A N/A % Reduction in Volume: 69.40% N/A N/A Classification: Full Thickness Without Exposed N/A N/A Support Structures Exudate Amount: Medium N/A N/A Exudate Type: Serosanguineous N/A  N/A Exudate Color: red, brown N/A N/A Granulation Amount: Large (67-100%) N/A N/A Granulation Quality: Red N/A N/A Necrotic Amount: None Present (0%) N/A N/A Exposed Structures: Fat Layer (Subcutaneous Tissue): N/A N/A Yes Fascia: No Tendon: No Muscle: No Joint: No Bone: No Epithelialization: None N/A N/A Treatment Notes Electronic Signature(s) Signed: 09/09/2022 4:14:55 PM By: [name] Pax RN Entered By: [name] on 09/09/2022 14:09:37 Shoberg, Ted Salena [name] ([id number]) -------------------------------------------------------------------------------- Multi-Disciplinary Care Plan Details Patient Name: [name], [name] Date of Service: 09/09/2022 2:00 PM Medical Record Number: [id number] Patient Account Number: [id number] Date of Birth/Sex: 20-Apr-1961 (61 y.o. F) Treating RN: [name] Primary Care Meela Wareing: [name] Other Clinician: Referring Patches Mcdonnell: [name], [name] Treating Teddy Pena/Extender: [name] in Treatment: 48 Active Inactive Wound/Skin Impairment Nursing Diagnoses: Knowledge deficit related to ulceration/compromised skin integrity Goals: Patient/caregiver will verbalize understanding of skin care regimen Date Initiated: 10/15/2021 Target Resolution Date: 10/09/2022 Goal Status: Active Ulcer/skin breakdown will have a volume reduction of 30% by week 4 Date Initiated: 10/15/2021 Date Inactivated: 01/29/2022 Target Resolution Date: 12/15/2021 Goal Status:  Unmet Unmet Reason: comorbities Ulcer/skin breakdown will have a volume reduction of 50% by week 8 Date Initiated: 10/15/2021 Date Inactivated: 01/29/2022 Target Resolution Date: 01/15/2022 Goal Status: Unmet Unmet Reason: comorbities Ulcer/skin breakdown will have a volume reduction of 80% by week 12 Date Initiated: 10/15/2021 Date Inactivated: 02/19/2022 Target Resolution Date: 02/15/2022 Goal Status: Unmet Unmet Reason: comorbities Ulcer/skin breakdown will heal within 14 weeks Date Initiated: 10/15/2021 Date Inactivated: 05/07/2022 Target Resolution Date: 03/15/2022 Goal Status: Unmet Unmet Reason: comorbities Interventions: Assess patient/caregiver ability to obtain necessary supplies Assess patient/caregiver ability to perform ulcer/skin care regimen upon admission and as needed Assess ulceration(s) every visit Notes: Electronic Signature(s) Signed: 09/09/2022 4:14:55 PM By: [name] Pax RN Entered By: [name] on 09/09/2022 14:09:26 [name], [name] ([id number]) -------------------------------------------------------------------------------- Pain Assessment Details Patient Name: Lehtinen, Shivangi [name]. Date of Service: 09/09/2022 2:00 PM Medical Record Number: [id number] Patient Account Number: [id number] Date of Birth/Sex: 11/17/1961 (61 y.o. F) Treating RN: [name] Primary Care Ayman Brull: [name] Other Clinician: Referring Maribell Demeo: [name], [name] Treating Mackinzee Roszak/Extender: [name] in Treatment: 63 Active Problems Location of Pain Severity and Description of Pain Patient Has Paino No Site Locations Pain Management and Medication Current Pain Management: Electronic Signature(s) Signed: 09/09/2022 4:14:55 PM By: [name] Pax RN Entered By: [name] on 09/09/2022 14:08:03 [name], [name] ([id number]) -------------------------------------------------------------------------------- Patient/Caregiver Education Details Patient Name: [name], [name]  [name]. Date of Service: 09/09/2022 2:00 PM Medical Record Number: [id number] Patient Account Number: [id number] Date of Birth/Gender: 03/31/1961 (61 y.o. F) Treating RN: [name] Primary Care Physician: [name] Other Clinician: Referring Physician: Card, [name] Treating Physician/Extender: [name] in Treatment: 42 Education Assessment Education Provided To: Patient Education Topics Provided Wound/Skin Impairment: Methods: Explain/Verbal Responses: State content correctly Electronic Signature(s) Signed: 09/09/2022 4:14:55 PM By: [name] Pax RN Entered By: [name] on 09/09/2022 14:34:44 [name], [name] ([id number]) -------------------------------------------------------------------------------- Wound Assessment Details Patient Name: Wiederhold, Maryetta [name]. Date of Service: 09/09/2022 2:00 PM Medical Record Number: [id number] Patient Account Number: [id number] Date of Birth/Sex: 12/23/1961 (61 y.o. F) Treating RN: [name] Primary Care Iram Lundberg: [name] Other Clinician: Referring Markela Wee: [name], [name] Treating Elinor Kleine/Extender: [name] in Treatment: 47 Wound Status Wound Number: 1 Primary Etiology: Dehisced Wound Wound Location: Distal, Midline Back Wound Status: Open Wounding Event: Surgical Injury Comorbid History: Hypertension Date Acquired: 04/11/2021 Weeks Of Treatment: 47 Clustered Wound: No Photos Wound Measurements Length: (cm) 0.3 Width: (cm) 0.3 Depth: (cm) 2.5  Area: (cm) 0.071 Volume: (cm) 0.177 % Reduction in Area: 43.7% % Reduction in Volume: 69.4% Epithelialization: None Tunneling: No Undermining: Yes Starting Position (o'clock): 9 Ending Position (o'clock): 3 Maximum Distance: (cm) 3.2 Wound Description Classification: Full Thickness Without Exposed Support Structu Exudate Amount: Medium Exudate Type: Serosanguineous Exudate Color: red, brown res Foul Odor After Cleansing: No Slough/Fibrino No Wound Bed Granulation  Amount: Large (67-100%) Exposed Structure Granulation Quality: Red Fascia Exposed: No Necrotic Amount: None Present (0%) Fat Layer (Subcutaneous Tissue) Exposed: Yes Tendon Exposed: No Muscle Exposed: No Joint Exposed: No Bone Exposed: No Electronic Signature(s) Signed: 09/16/2022 2:28:07 PM By: [name] Pax RN Previous Signature: 09/16/2022 2:25:41 PM Version By: [name] Pax RN Previous Signature: 09/09/2022 4:14:55 PM Version By: [name] Pax RN Entered By: [name] on 09/16/2022 14:28:06 Salvador, Glendene [name]. ([id number]) Boettger, Cason [name]. ([id number]) -------------------------------------------------------------------------------- Vitals Details Patient Name: Pickerel, Tenishia [name]. Date of Service: 09/09/2022 2:00 PM Medical Record Number: [id number] Patient Account Number: [id number] Date of Birth/Sex: 11/26/1961 (61 y.o. F) Treating RN: [name] Primary Care Fani Rotondo: [name] Other Clinician: Referring Denaya Horn: [name], [name] Treating Myah Guynes/Extender: [name] in Treatment: 13 Vital Signs Time Taken: 14:07 Temperature (F): 98.2 Height (in): 58 Pulse (bpm): 86 Weight (lbs): 275 Respiratory Rate (breaths/min): 18 Body Mass Index (BMI): 57.5 Blood Pressure (mmHg): 141/77 Reference Range: 80 - 120 mg / dl Electronic Signature(s) Signed: 09/09/2022 4:14:55 PM By: [name] Pax RN Entered By: [name] on 09/09/2022 14:07:53

## 2022-09-16 ENCOUNTER — Encounter: Payer: 59 | Admitting: Physician Assistant

## 2022-09-16 DIAGNOSIS — T8131XA Disruption of external operation (surgical) wound, not elsewhere classified, initial encounter: Secondary | ICD-10-CM | POA: Diagnosis not present

## 2022-09-16 NOTE — Progress Notes (Addendum)
ROSEMARIA, INABINET ([id number]) Visit Report for 09/16/2022 Chief Complaint Document Details Patient Name: Sprowl, Tula C. Date of Service: 09/16/2022 2:00 PM Medical Record Number: [id number] Patient Account Number: [id number] Date of Birth/Sex: Jul 07, 1961 (61 y.o. F) Treating RN: Yevonne Pax Primary Care Provider: [name] Other Clinician: Referring Provider: Card, John Treating Provider/Extender: Rowan Blase in Treatment: 48 Information Obtained from: Patient Chief Complaint Surgical Back Ulcer Electronic Signature(s) Signed: 09/16/2022 1:53:52 PM By: [name] PA-C Entered By: [name] on 09/16/2022 13:53:52 Densmore, Hugh Salena Saner ([id number]) -------------------------------------------------------------------------------- HPI Details Patient Name: Arnold, Charlena C. Date of Service: 09/16/2022 2:00 PM Medical Record Number: [id number] Patient Account Number: [id number] Date of Birth/Sex: 01-17-1961 (61 y.o. 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 This was actually broken up into 3 separate surgical events. The initial surgical intervention actually was on November 05, 2021 almost a year ago. Subsequently the patient went back in February for a seroma of the area which unfortunately required her to have a repeat surgery to go in and clean this out. And then again this occurred in April where she went back in and again they felt like stitches were coming out and there was an additional seroma. She was placed in a wound VAC initially and then subsequently as it got smaller that  was discontinued. Again right now I will see anything that I think a wound VAC would help with. Nonetheless she definitely has a significant depth to the wound that is going require packing. I actually believe the Hydrofera Blue rope would probably do quite well with this the problem is as much as it is draining she probably needs this to be changed at least every day. She does not really have anyone that can help with that that is the complicating scenario here. With that being said the patient does have a history of multiple sclerosis, hypertension, and again this surgical wound dehiscence in regard to her lumbar spine region. She did have a repeat MRI which was actually completed 10/09/2021. This showed that there was no significant change in the subcutaneous fluid collection/track of the lower lumbar region. This is extending to the level of the fascia unfortunately. This seems to go all the way from the L2 level with a track extending all the way to the fascia at the L4-5 level. Again this is a significant wound and there is significant drainage but does not seem to communicate to the spinal region as far as spinal fluid or otherwise is concerned that is good news. Nonetheless she last saw Dr. Franky Macho who is her neurosurgeon on 10/01/2021 that was when he ordered this last MRI she supposed to see him next week as well. With that being said he did not feel like there was any significant issue there but was not sure why this was not healing that is when he ordered the MRI. They were wanting to make sure that this was packed appropriately by home health unfortunately the main issue currently is that home health is completely out of the picture as the patient has exhausted all the home health that that she gets for a year. She is now in a very difficult  predicament where she does not have anyone to help her change the dressing and to be honest that she is not able to do it herself with the location of  the wound being on the midline lumbar spine region. If she does not have anyone that can help it is probably can to be necessary for her to go to a facility for rehab and daily dressing changes as I feel like daily changes which is much drainage that she is having is going to be necessary. 10/23/2021 upon evaluation today patient appears to be doing decently well in regard to her back ulcer. This does seem to be draining a lot less than what it is been doing in the past. With that being said she still has quite a bit of drainage nonetheless. I do think that given time this should improve least I hope so. The good news is she does have home health coming out 3 days a week were doing it 2 days a week and she is paying someone we can to help. 10/30/2021 upon evaluation today patient appears to be doing okay in regard to her back ulcer this is not draining quite as bad as it was in the beginning but he still has quite a bit of drainage noted. I do believe that the patient would benefit from [place] going ahead forward with attempting a wound VAC using the Hydrofera Blue rope to pack with and then subsequently using the VAC externally to actually suction out and help this to fill- in. I think this is our ideal way to try to get things cleared at this point. As it stands I am not certain that we are really making a progress that we want to see near with doing it in the way we are which is packing with the rope. It is a good dressing but I do think it is insufficient for total healing. She just seems to have too much in the way of drainage at this point unfortunately. 11/06/2021 upon evaluation today patient unfortunately continues to have issues with her back ongoing. The good news is her MRI that was repeated showed signs of the size of this area in the lumbar spine region having decreased from 4 cm to 3.5 cm this is definitely not bad news at all. With that being said unfortunately she continues to have issues  with ongoing drainage not as severe as in the beginning but nonetheless still significant. I do think a wound VAC still would be a good way to go although her home health agency nurse apparently has some concerns about the possibility of not being able to keep a seal with this as they had struggles in the past. Nonetheless I explained to the patient that this is much different than what she had previous and that I really feel like it would do much better as far as getting the area taken care of without having any complications or issues here. I think that we should be able to maintain a seal. Nonetheless at this time I did discuss with the patient as well that she probably does need to have a wound VAC in order for [place] to get this moving in the right direction. 11/13/2021 upon evaluation patient's wound bed actually showed signs of significant drainage at this time. She did see the surgeon yesterday he did not see anything that appeared to be infected. Nonetheless he does appear that she is continuing to have areas here that just do not seem  to want to seal up there MRI findings have been negative but nonetheless she continues to have is the seroma that is filling in. I do feel like we need to try to widen the hole so we can get at least a half of the [facility] then this will be better than nothing at this point. 11/20/2021 upon evaluation today patient actually appears to be having less pain at this point which is good news and overall she we still do not have the results of the culture back yet it had to be sent out to [facility] and we do not have the result back yet. Is doing decently well in regard to her wound. Fortunately there does not appear to be any signs of active infection systemically nor locally at this time. 11/27/2021 upon evaluation today patient appears to be doing well with regard to her wound all things considered there does appear to be less drainage than there was previous.  Fortunately I do not see any evidence of worsening of the patient is stating that she is having some issues with back pain. This is somewhat new. Again this I think could be related to the fact that she is having some issues here with infection. We are still waiting to see what the result of her culture shows from susceptibility testing Enterococcus has been identified but we do not know if this is VRE or not. 12/04/2021 upon evaluation today patient appears to be doing well with regard to her wound all things considered. I did have a conversation with Erin from Dr. Sueanne Margarita office. Of note she notes that Dr. Drinda Butts really does not want to do anything surgical right now which I completely understand. With that being said I am still leery of how far we will make it getting this to heal short of any type of surgery to open this up and allow [place] to more appropriately packed the wound. Nonetheless I will absolutely give it our best shot as far as that is concerned. I discussed that with the patient today. She voiced understanding. The good news is the drainage today seems to be clear it is no longer cloudy as it was previous I am actually very pleased in that regard. SHAQUANTA, HARKLESS ([id number]) 12/11/2021 since have last seen the patient actually did have an conversation with Dr. Franky Macho with her neurosurgeon. Again this involve the discussion around whether or not to open the wound and try to apply a wound VAC following. With that being said the decision was made that that may be the best thing to do if the patient was in agreement. Nonetheless I am actually extremely encouraged with what I am seeing today much more than I would have thought. In fact I think that we may be over packing the wound which is why she is having some discomfort at this point as there is much less of the dressing able to get and this time compared to what we saw previous. That is actually really good news. In fact the 6:00  tunnel appears to have filled in is awesome news. At the 12:00 location I am actually able to pack into that area pretty effectively at this point today. In fact I was able to get less of the packing in which I will detail below. 12/18/2021 upon evaluation today patient appears to be doing well with regard to her wound on the back. Fortunately there is no signs of active infection which is great news and overall  very pleased with where things stand today. No fevers, chills, nausea, vomiting, or diarrhea. 01/01/2022 upon evaluation today patient appears to be doing decently well in regard to her wound. Fortunately there does not appear to be any signs of anything worsening which is great news. She still continues to have issues with drainage but this is not nearly as significant as what it was in the past. There is all clear drainage no signs of purulence noted. 01/08/2022 upon evaluation today patient appears to be doing well with regard to her wound. With that being said she tells me that she has been having some increased pain over the past week. It does appear based on the amount of Hydrofera Blue that we removed this is probably over pack. Again it is difficult to know exactly how old the nurses getting all the skin but nonetheless the length of Hydrofera Blue that was utilized was way too much which may be part of the reason why this felt so uncomfortable. Fortunately I think that we can cut back on that we discussed pieces smaller so hopefully that will not be over packed. 01/22/2021 upon evaluation today patient appears to be doing well With regard to there being no signs of infection at this time. The wound does still tunnel mainly up at the 12:00 location. The depth of the tunnel complete from entry point to the base is about 3.5 cm today. With that being said I do believe that overall she is making good progress this is just a very slow process for her which I know has been frustrating as  well. 01/29/2022 upon evaluation today patient appears to be doing well with regard to the wound on her lower back and the lumbar spine region. The good news is the depth that I got this week was a little bit less going up at the 12:00 location as compared to last week. I measured this to be 3.5 cm last week and it was 3.3 cm this week. Again that is a minute shift but nonetheless a good shift in the right direction. Overall I think that we are on the right track. 02/05/2022 upon evaluation today patient appears to be doing well with regard to her wound. In fact right now her measurements are somewhere around 2.9 cm which is definitely smaller and less deep than last week At the 12:00 location. Overall I am very pleased with what we are seeing. 02/19/2022 upon evaluation today patient unfortunately is noting that the wound is actually closing up externally but internally there is still some depth to this. I discussed with her today that we cannot let it close up like this is can end up being a significant issue for her if we allow for that. Subsequently we need to do what we can to try to open this up and keep it open more effectively. She voiced understanding. With that being said we are going to proceed with that procedure today in order to debride away some of the skin on externally that is trying to close and on this. 02/26/2022 upon evaluation patient appears to be doing better in my opinion after we had to open up this area last week. Again she has a significant amount of healing and overall I am extremely pleased with where things stand currently. I do not see any evidence of active infection locally nor systemically at this time which is great news and in general I think that we are on the right track for getting  this hopefully closed final and done. 03/05/2022 upon evaluation today patient unfortunately is doing a little bit worse with regard to the whole size this is starting to get much  smaller unfortunately. There does not appear to be any signs of active infection locally nor systemically which is great news. With that being said I am concerned about the fact that the patient is doing quite a bit worse with regard to trapping some fluid and almost feels like she may have a fluid pocket that not only goes in and up towards 12:00 but looks back around towards the more superficial subcutaneous tissue. I am very concerned that this is going to be something that is very hard for [place] to heal short of another surgery to open this up and try to wound VAC from the inside out. Even then there is no guarantees this is just a very difficult situation to be perfectly honest. 03/14/2022 upon evaluation today patient appears to be doing well with regard to her wound as far as the area staying open and pleased in that regard. With that being said unfortunately she is not doing as well when it comes to the irritation around it appears to be very inflamed and red this is not good that was discussed with her today as well. Subsequently I do believe that working to need to do what we can do to try and calm this down in the past she did well with the Augmentin due to Enterococcus infection I am not sure if were dealing with the same thing or not but at the same time I think that is probably to be my go to at this point. 03/19/2022 upon evaluation today patient appears to be doing really about the same in regard to her wound that the pain is better. 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 She seems to be improved dramatically with the antibiotic currently although it does not cover for MRSA I am apt to just see how this progresses over the next several days to a week. With that being said the bigger issue here is that we simply do not seem to be making excellent progress and I am concerned about the lack of progress. I discussed with Dr. Shon Baton with [facility] in [place]  and to be honest his recommendation was that the best bet would probably be to get her to a teaching center. She is actually been seen for rheumatology and a I believe psychologist/psychiatrist at [facility]. She would prefer to go that direction as opposed to [facility] or [facility]. I am definitely okay with that. 03/26/2022 upon evaluation today patient appears to be doing okay in regard to her wound. She is not showing signs of worsening and overall she tells me that her pain is not nearly as bad as it was previous. Fortunately I do not see any evidence of active infection locally or systemically which is great news. No fevers, chills, nausea, vomiting, or diarrhea. 04-02-2022 upon evaluation today patient appears to be doing well with regard to her wound. It does not appear to be showing any signs of worsening which is great news. With that being said you are still having some issues here with drainage although it is clear and mainly just tenderness with bleeding from the [facility] I do think that treating for the second issue which is MRSA is probably the right thing to do at this point she is now off of the Augmentin. 04-09-2022 upon  evaluation today patient's wound actually appears to be doing about the same. Fortunately I do not see any evidence of active infection locally or systemically at this time which is great news. No fevers, chills, nausea, vomiting, or diarrhea. 04-16-2022 upon evaluation today patient appears to be doing well with regard to her wound in the lumbar spine region. Subsequently she continues to have an area that does have a tunnel up into the 12:00 location. This is about 3.5 cm using a skinny probe curved upwards to get into this region. With that being said I really do not see any signs of overall improvement but also do not see any signs of worsening I feel like work on about a still made here to some degree. The patient did see her neurologist and he has referred her to  neurosurgery at [facility] for second ULYANA, HARDBARGER. ([id number]) opinion we will see what they have to say as well. 04-23-2022 upon evaluation today patient appears to be doing well with regard to her wound all things considered. She has been tolerating the dressing changes without complication. Fortunately I do not see any signs of active infection locally nor systemically which is great news. No fever chills noted 04-30-2022 upon evaluation today patient appears to be doing about the same in regard to her wound. The depth is really not dramatically improved as far as the 12:00 tunnel is concerned. The overall depth and the base of the wound does seem to be a little bit better it does sound like that external wound has closed as far as the opening is concerned we can have to cut this down from a half to a smaller size based on what we are seeing today. 05-07-2022 upon evaluation today patient's wound actually seems to be doing decently well. There is not a major shift here but a slight shift in the depth both straight and as well as in the [place] at 12:00. With that being said I again we will talk about a couple of millimeters but still every little bit can count when there are situations like this to be honest. 05-14-2022 upon evaluation today patient appears to be doing okay in regard to her wound. I do not see any signs of anything worsening. With that being said also do not see getting significantly better unfortunately. There does not appear to be any evidence of infection locally or systemically which is great news. No fevers, chills, nausea, vomiting, or diarrhea. 05-21-2022 upon evaluation today patient actually appears to be doing quite well in regard to her wound from the standpoint of there being no infection. With that being said there does not appear to be any evidence of infection locally nor systemically which is great news. No fevers, chills, nausea, vomiting, or diarrhea. 05-28-2022  upon evaluation today patient's wound actually appears to be doing about the same. I do not see any evidence of active infection locally or systemically which is great news. No fevers, chills, nausea, vomiting, or diarrhea. 06-04-2022 upon evaluation today patient appears to be doing well with regard to her wound. She has been tolerating dressing changes without complication. Fortunately I do not feel like there is any worsening also I do not feel like there is any improvement. I feel like right a solid area here where she has a tunnel that tracks up at 12:00 and then has a fluid pocket at around roughly 1:00 I can push on this and squeeze fluid out. Again I am not exactly sure  what else can be done from my standpoint to improve this. She does have an appointment with Dr. Christella Noa who I do believe is an excellent surgeon and this is the surgeon who performed this surgery initially. Again the biggest issue was following she had a lot of trouble with the wound VAC I do think that we need to try to see if she is can have a wound VAC following surgery what exactly regular need to do to help keep this moving in the right direction for her. Obviously I want her to have the best result possible that she has to go through surgery again to clean this area out. Absolutely willing to help out with getting this to heal. 06-13-2022 upon evaluation today patient appears to be doing well with regard to her wound its not any worse unfortunately it is also not significantly better which is the only issue currently. I do not see any signs of active infection locally or systemically which is great news. No fevers, chills, nausea, vomiting, or diarrhea. She does have her appointment on Monday with her neurosurgeon and we will see you Dr. Christella Noa has to say at that point. 06-18-2022 upon evaluation patient appears to be doing a little worse in regard to her wound only in the respect that has been several days since she has had  this changed in fact it was last changed on Friday. Since that time she tells me that she has kept this in place over the weekend and yesterday she was supposed to see her neurosurgeon yesterday but they had to cancel due to an emergency surgery according to what the patient tells me. Nonetheless she is having some issues here with drainage coming from the wound that is more purulent in nature I think this is something we have been seeing in the [facility] for several weeks but is more obvious being that it was not changed as much during the last week. She unfortunately is a little worried about this but again I think that this is just part of what we have been dealing with all long is more prevalent and obvious due to the length of time since it was changed last. She does have a repeat appointment for I believe July 10 she told me although she is going to see if she can find anything sooner. 06-25-2022 upon evaluation today patient's wound actually showed signs of marginal improvement which is good news. She still has the area of abscess that seems to be at roughly 2:00 when looking at her back. We can get into this with the skinny probe other than that I really do not know what else I can do to try to make this better. We discussed this and she does have an appointment with her neurosurgeon although that is not till mid July as he had to change it at the last moment during the time she was post to see him a week ago. She does have evidence of potential infection on culture I am going to go ahead and treat this in order to ensure that nothing worsens. With that being said I do believe that overall she seems to be doing well but I do think we want to make sure that that the knee gets worse in the meantime until she gets to see her neurosurgeon. 07-01-2022 upon evaluation today patient appears to continue to have trouble with her wound draining. Again we have been continue to monitor this and I still  see  the same issue that I have noted before the patient has an area which is draining seemingly from around the 2:00 location when you go up towards 12:00 and then over towards 2:00 looking at her back this is where there is actually a soft area external that if you push on it you will see fluid come out and subsequently this is also where it tracks to internally. Everything in the 6:00 location has completely healed in and this looks much better in that regard but this upper portion we just cannot get to closed with the methods that we have. It seemed to me that this is probably going to require that this area be open and cleaned out in order to allow it to heal and my suggestion would be that a wound VAC is probably going to be the ideal thing. 07-09-2022 upon evaluation today patient appears to be doing somewhat poorly in regard to her wound. She did see Dr. Franky Macho. He stated that the wound was not being "packed appropriately" according to the patient and her daughter who were present during the evaluation today as well as the evaluation with him yesterday. With that being said he packed the wound the way he said it needed to be and to be partly honest that is over packed. The patient is having a tremendous amount of discomfort and states that this is no way that she is good to be able to tolerate that. I understand the concern about the whole closing down which is why I recommended trying to keep this open is much as possible and again with that all being said this is still going to continue to be an issue as far as this closing down before everything heals up on the inside. Nonetheless she continues to have issues with significant drainage. She also has an area which almost feels like a small abscess or least of fluid pocket that feels up that was not appropriately packed with the dressings that were in place. With that being said I think that this is going to have to be opened at some point in  time in order to get this to heal I am really not certain the best way to go about this. I discussed that with the patient again today. 07-16-2022 upon evaluation today patient appears to be doing well currently in regard to her wound from the standpoint of the pain getting better which is great news. Fortunately I do not see any evidence of active infection locally or systemically which is great news. No fevers, chills, nausea, vomiting, or diarrhea. With that being said the patient has been tolerating the dressing changes better without complication which is great news. GLADYS, DECKARD ([id number]) 07-29-2022 upon evaluation today patient appears to be doing actually pretty well in regard to her wound. Fortunately there does not appear to be any signs of infection. She still has drainage and we still are seeing an area from this pocket up at 12:00 that is leaking but fortunately this does not appear to be having any significant issues with infection right now which is great news. Fortunately her pain is also calm down since I saw her 2 weeks ago. 08-05-2022 upon evaluation today patient appears to be doing unfortunately a little worse in regard to the discomfort she is feeling she tells me that this is bothering her pretty much all the time at a low level. It never seems to go away. This is since last week. With that being  said the overall depth is unfortunately a little bit deeper this week which is definitely not what I was hoping to see. I did get to review the second opinion that was requested by the patient from her insurance to have an independent physician review the situation here. The short story of that reviewed which I did read through the entirety of she states that the patient does likely need surgery to correct the seroma considering length of time this has been going on. It was recommended that a plastic surgeon be the one to have this up in case there is a muscle flap necessary but at  minimum that this area needs to be cleaned out and then appropriately close following. The patient has also read through this. She does seem to be a little bit off of her normal game not feeling quite that well today. She is not exactly sure what is going on. 08-12-2022 upon evaluation today patient's wound is actually showing signs of maintaining stability which is good news. Fortunately there does not appear to be any evidence of active infection locally or systemically at this time which is excellent. With that being said she does have a meeting with her nurse with [place] [facility] on Thursday to discuss options for plastic surgery where she is able to go. 08-19-2022 upon evaluation today patient appears to be doing well currently in regard to her wound all things considered. Unfortunately the patient is still continuing to have some issues here with an ongoing open wound in the back despite everything that we have tried she has had an independent review which recommended surgical intervention. Where the process of trying to find someone to take her on as a patient. The suggestion was for plastic surgery which I think is probably a good option here. With that being said she has been looking through her insurance to see who was in network for her and has found someone that I think will be very good. Working to see about making that referral to them. 08-26-2022 upon evaluation today patient appears to be doing well currently in regard to her wound. She tells me she is having some discomfort fortunately there does not appear to be any evidence of active infection at this time which is great news. We have actually gotten her an appointment finally with the plastic surgeon that is 4 I believe it is September 26. 09-03-2022 upon evaluation today patient appears to be doing well currently in regard to her wound everything appears to be stable which is good in that regard. With that being said the drainage  does appear to be a little more thick compared to normal she has been having a lot of irritation as well. I do believe that it may be at the point that the repeating the Augmentin that she is previously done well with in the past could be of benefit for her here currently. The patient voiced understanding she has not had this since September 27 the last time I prescribed this for her. Nonetheless it did do a very good job to calm things down quite a bit back at that time. 09-09-2022 upon evaluation today patient appears to be doing well currently in regard to her wound she is stable. There does not appear to be anything worsening at this point. Fortunately I see no signs of active infection at this time. No fevers, chills, nausea, vomiting, or diarrhea. 09-16-2022 upon evaluation today patient appears to be doing well currently regard to  her wound there does not appear to be any signs of active infection locally or systemically at this time which is great news. No fevers, chills, nausea, vomiting, or diarrhea. Electronic Signature(s) Signed: 09/16/2022 2:50:56 PM By: [name] PA-C Entered By: [name] on 09/16/2022 14:50:56 Parmer, Domingo Pulse ([id number]) -------------------------------------------------------------------------------- Physical Exam Details Patient Name: Busker, Campbell C. Date of Service: 09/16/2022 2:00 PM Medical Record Number: [id number] Patient Account Number: [id number] Date of Birth/Sex: 05/20/1961 (61 y.o. F) Treating RN: Yevonne Pax Primary Care Provider: [name] Other Clinician: Referring Provider: Card, John Treating Provider/Extender: Rowan Blase in Treatment: 40 Constitutional Well-nourished and well-hydrated in no acute distress. Respiratory normal breathing without difficulty. Psychiatric this patient is able to make decisions and demonstrates good insight into disease process. Alert and Oriented x 3. pleasant and cooperative. Notes Upon  inspection patient's wound bed actually showed signs of good granulation and epithelization at this point. Fortunately I do not see any evidence of active infection which is great news. In general I think that this wound is not trying to close down too tightly which is good the Hydrofera Blue to keep it open she did have a little bit of bleeding but this seems to be coming from the outer edge of my opinion. Electronic Signature(s) Signed: 09/16/2022 2:51:19 PM By: [name] PA-C Entered By: [name] on 09/16/2022 14:51:19 Shin, Domingo Pulse ([id number]) -------------------------------------------------------------------------------- Physician Orders Details Patient Name: Pianka, Abrianna C. Date of Service: 09/16/2022 2:00 PM Medical Record Number: [id number] Patient Account Number: [id number] Date of Birth/Sex: Nov 29, 1961 (61 y.o. 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 o Nurse Visit as needed Home Health o Home Health Company: [facility] o [facility] for wound care. May utilize formulary equivalent dressing for wound treatment orders unless otherwise specified. Home Health Nurse may visit PRN to address patientos wound care needs. - Monday and Friday [facility] fax [phone] Bathing/ Shower/ Hygiene o May shower; gently cleanse wound with antibacterial soap, rinse and pat dry prior to dressing wounds o No tub bath. Anesthetic (Use 'Patient Medications' Section for  Anesthetic Order Entry) o Lidocaine applied to wound bed Edema Control - Lymphedema / Segmental Compressive Device / Other o Elevate, Exercise Daily and Avoid Standing for Long Periods of Time. o Elevate legs to the level of the heart and pump ankles as often as possible o Elevate leg(s) parallel to the floor when sitting. Additional Orders / Instructions o Follow Nutritious Diet and Increase Protein Intake Wound Treatment Wound #1 - Back Wound Laterality: Midline, Distal Cleanser: Normal Saline 3 x Per Week/30 Days Discharge Instructions: Wash your hands with soap and water. Remove old dressing, discard into plastic bag and place into trash. Cleanse the wound with Normal Saline prior to applying a clean dressing using gauze sponges, not tissues or cotton balls. Do not scrub or use excessive force. Pat dry using gauze sponges, not tissue or cotton balls. Primary Dressing: Hydrofera Blue Rope 3 x Per Week/30 Days Discharge Instructions: cut into fourths; angle the end Secondary Dressing: (BORDER) Zetuvit Plus SILICONE BORDER Dressing 4x4 (in/in) 3 x Per  Week/30 Days Electronic Signature(s) Signed: 09/16/2022 2:31:54 PM By: Yevonne Pax RN Signed: 09/16/2022 4:49:05 PM By: [name] PA-C Entered By: Yevonne Pax on 09/16/2022 14:16:03 Auzenne, Rakiya CMarland Kitchen ([id number]) -------------------------------------------------------------------------------- Problem List Details Patient Name: Durfey, Yanelis C. Date of Service: 09/16/2022 2:00 PM Medical Record Number: [id number] Patient Account Number: [id number] Date of Birth/Sex: 1961/01/17 (61 y.o. 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 ([id number]) -------------------------------------------------------------------------------- Progress Note Details Patient Name: Lucente, Kimika C. Date of Service: 09/16/2022 2:00 PM Medical Record Number: [id number] Patient Account Number: [id number] Date of Birth/Sex: [birth year] (61 y.o. F) Treating RN: Yevonne Pax Primary Care Provider: [name] Other Clinician: Referring Provider: Card, John Treating Provider/Extender: Rowan Blase in Treatment: 48 Subjective Chief Complaint Information obtained from Patient Surgical Back Ulcer History of Present Illness (HPI) 10/15/2021 upon evaluation today patient presents for initial evaluation here in the clinic concerning a surgical ulceration/dehiscence in the lumbar spine region following surgery that she had over the past year. This was actually broken up into 3 separate surgical events. The initial surgical intervention actually was on November 05, 2021 almost a year ago. Subsequently the patient went back in February for a seroma of the area which unfortunately required her to have a repeat surgery to go in and clean this out. And then again this occurred in April where she went back in and again they felt like stitches were coming out and there was an additional seroma. She was placed in a wound VAC initially and then subsequently as it got smaller that was discontinued. Again right now I will see anything that I think a wound VAC would help with. Nonetheless she definitely has a significant depth to the wound that is going require  packing. I actually believe the Hydrofera Blue rope would probably do quite well with this the problem is as much as it is draining she probably needs this to be changed at least every day. She does not really have anyone that can help with that that is the complicating scenario here. With that being said the patient does have a history of multiple sclerosis, hypertension, and again this surgical wound dehiscence in regard to her lumbar spine region. She did have a repeat MRI which was actually completed 10/09/2021. This showed that there was no significant change in the subcutaneous fluid collection/track of the lower lumbar region. This is extending to the level of the fascia unfortunately. This seems to go all the way from the L2 level with a track extending all the way to the fascia at the L4-5 level. Again this is a significant wound and there is significant drainage but does not seem to communicate to the spinal region as far as spinal fluid or otherwise is concerned that is good news. Nonetheless she last saw Dr. Franky Macho who is her neurosurgeon on 10/01/2021 that was when he ordered this last MRI she supposed to see him next week as well. With that  being said he did not feel like there was any significant issue there but was not sure why this was not healing that is when he ordered the MRI. They were wanting to make sure that this was packed appropriately by home health unfortunately the main issue currently is that home health is completely out of the picture as the patient has exhausted all the home health that that she gets for a year. She is now in a very difficult predicament where she does not have anyone to help her change the dressing and to be honest that she is not able to do it herself with the location of the wound being on the midline lumbar spine region. If she does not have anyone that can help it is probably can to be necessary for her to go to a facility for rehab and daily  dressing changes as I feel like daily changes which is much drainage that she is having is going to be necessary. 10/23/2021 upon evaluation today patient appears to be doing decently well in regard to her back ulcer. This does seem to be draining a lot less than what it is been doing in the past. With that being said she still has quite a bit of drainage nonetheless. I do think that given time this should improve least I hope so. The good news is she does have home health coming out 3 days a week were doing it 2 days a week and she is paying someone we can to help. 10/30/2021 upon evaluation today patient appears to be doing okay in regard to her back ulcer this is not draining quite as bad as it was in the beginning but he still has quite a bit of drainage noted. I do believe that the patient would benefit from [place] going ahead forward with attempting a wound VAC using the Hydrofera Blue rope to pack with and then subsequently using the VAC externally to actually suction out and help this to fill- in. I think this is our ideal way to try to get things cleared at this point. As it stands I am not certain that we are really making a progress that we want to see near with doing it in the way we are which is packing with the rope. It is a good dressing but I do think it is insufficient for total healing. She just seems to have too much in the way of drainage at this point unfortunately. 11/06/2021 upon evaluation today patient unfortunately continues to have issues with her back ongoing. The good news is her MRI that was repeated showed signs of the size of this area in the lumbar spine region having decreased from 4 cm to 3.5 cm this is definitely not bad news at all. With that being said unfortunately she continues to have issues with ongoing drainage not as severe as in the beginning but nonetheless still significant. I do think a wound VAC still would be a good way to go although her home health agency  nurse apparently has some concerns about the possibility of not being able to keep a seal with this as they had struggles in the past. Nonetheless I explained to the patient that this is much different than what she had previous and that I really feel like it would do much better as far as getting the area taken care of without having any complications or issues here. I think that we should be able to maintain a seal. Nonetheless  at this time I did discuss with the patient as well that she probably does need to have a wound VAC in order for [place] to get this moving in the right direction. 11/13/2021 upon evaluation patient's wound bed actually showed signs of significant drainage at this time. She did see the surgeon yesterday he did not see anything that appeared to be infected. Nonetheless he does appear that she is continuing to have areas here that just do not seem to want to seal up there MRI findings have been negative but nonetheless she continues to have is the seroma that is filling in. I do feel like we need to try to widen the hole so we can get at least a half of the [facility] then this will be better than nothing at this point. 11/20/2021 upon evaluation today patient actually appears to be having less pain at this point which is good news and overall she we still do not have the results of the culture back yet it had to be sent out to [facility] and we do not have the result back yet. Is doing decently well in regard to her wound. Fortunately there does not appear to be any signs of active infection systemically nor locally at this time. 11/27/2021 upon evaluation today patient appears to be doing well with regard to her wound all things considered there does appear to be less drainage than there was previous. Fortunately I do not see any evidence of worsening of the patient is stating that she is having some issues with back pain. This is somewhat new. Again this I think could be related  to the fact that she is having some issues here with infection. We are still waiting to see what the result of her culture shows from susceptibility testing Enterococcus has been identified but we do not know if this is VRE or not. 12/04/2021 upon evaluation today patient appears to be doing well with regard to her wound all things considered. I did have a conversation with Erin from Dr. Sueanne Margarita office. Of note she notes that Dr. Drinda Butts really does not want to do anything surgical right now which I completely Politte, Birtha C. ([id number]) understand. With that being said I am still leery of how far we will make it getting this to heal short of any type of surgery to open this up and allow [place] to more appropriately packed the wound. Nonetheless I will absolutely give it our best shot as far as that is concerned. I discussed that with the patient today. She voiced understanding. The good news is the drainage today seems to be clear it is no longer cloudy as it was previous I am actually very pleased in that regard. 12/11/2021 since have last seen the patient actually did have an conversation with Dr. Franky Macho with her neurosurgeon. Again this involve the discussion around whether or not to open the wound and try to apply a wound VAC following. With that being said the decision was made that that may be the best thing to do if the patient was in agreement. Nonetheless I am actually extremely encouraged with what I am seeing today much more than I would have thought. In fact I think that we may be over packing the wound which is why she is having some discomfort at this point as there is much less of the dressing able to get and this time compared to what we saw previous. That is actually really good news. In  fact the 6:00 tunnel appears to have filled in is awesome news. At the 12:00 location I am actually able to pack into that area pretty effectively at this point today. In fact I was able to get  less of the packing in which I will detail below. 12/18/2021 upon evaluation today patient appears to be doing well with regard to her wound on the back. Fortunately there is no signs of active infection which is great news and overall very pleased with where things stand today. No fevers, chills, nausea, vomiting, or diarrhea. 01/01/2022 upon evaluation today patient appears to be doing decently well in regard to her wound. Fortunately there does not appear to be any signs of anything worsening which is great news. She still continues to have issues with drainage but this is not nearly as significant as what it was in the past. There is all clear drainage no signs of purulence noted. 01/08/2022 upon evaluation today patient appears to be doing well with regard to her wound. With that being said she tells me that she has been having some increased pain over the past week. It does appear based on the amount of Hydrofera Blue that we removed this is probably over pack. Again it is difficult to know exactly how old the nurses getting all the skin but nonetheless the length of Hydrofera Blue that was utilized was way too much which may be part of the reason why this felt so uncomfortable. Fortunately I think that we can cut back on that we discussed pieces smaller so hopefully that will not be over packed. 01/22/2021 upon evaluation today patient appears to be doing well With regard to there being no signs of infection at this time. The wound does still tunnel mainly up at the 12:00 location. The depth of the tunnel complete from entry point to the base is about 3.5 cm today. With that being said I do believe that overall she is making good progress this is just a very slow process for her which I know has been frustrating as well. 01/29/2022 upon evaluation today patient appears to be doing well with regard to the wound on her lower back and the lumbar spine region. The good news is the depth that I got this  week was a little bit less going up at the 12:00 location as compared to last week. I measured this to be 3.5 cm last week and it was 3.3 cm this week. Again that is a minute shift but nonetheless a good shift in the right direction. Overall I think that we are on the right track. 02/05/2022 upon evaluation today patient appears to be doing well with regard to her wound. In fact right now her measurements are somewhere around 2.9 cm which is definitely smaller and less deep than last week At the 12:00 location. Overall I am very pleased with what we are seeing. 02/19/2022 upon evaluation today patient unfortunately is noting that the wound is actually closing up externally but internally there is still some depth to this. I discussed with her today that we cannot let it close up like this is can end up being a significant issue for her if we allow for that. Subsequently we need to do what we can to try to open this up and keep it open more effectively. She voiced understanding. With that being said we are going to proceed with that procedure today in order to debride away some of the skin on externally that  is trying to close and on this. 02/26/2022 upon evaluation patient appears to be doing better in my opinion after we had to open up this area last week. Again she has a significant amount of healing and overall I am extremely pleased with where things stand currently. I do not see any evidence of active infection locally nor systemically at this time which is great news and in general I think that we are on the right track for getting this hopefully closed final and done. 03/05/2022 upon evaluation today patient unfortunately is doing a little bit worse with regard to the whole size this is starting to get much smaller unfortunately. There does not appear to be any signs of active infection locally nor systemically which is great news. With that being said I am concerned about the fact that the patient  is doing quite a bit worse with regard to trapping some fluid and almost feels like she may have a fluid pocket that not only goes in and up towards 12:00 but looks back around towards the more superficial subcutaneous tissue. I am very concerned that this is going to be something that is very hard for [place] to heal short of another surgery to open this up and try to wound VAC from the inside out. Even then there is no guarantees this is just a very difficult situation to be perfectly honest. 03/14/2022 upon evaluation today patient appears to be doing well with regard to her wound as far as the area staying open and pleased in that regard. With that being said unfortunately she is not doing as well when it comes to the irritation around it appears to be very inflamed and red this is not good that was discussed with her today as well. Subsequently I do believe that working to need to do what we can do to try and calm this down in the past she did well with the Augmentin due to Enterococcus infection I am not sure if were dealing with the same thing or not but at the same time I think that is probably to be my go to at this point. 03/19/2022 upon evaluation today patient appears to be doing really about the same in regard to her wound that the pain is better. 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 She seems to be improved dramatically with the antibiotic currently although it does not cover for MRSA I am apt to just see how this progresses over the next several days to a week. With that being said the bigger issue here is that we simply do not seem to be making excellent progress and I am concerned about the lack of progress. I discussed with Dr. Shon Baton with [facility] in [place] and to be honest his recommendation was that the best bet would probably be to get her to a teaching center. She is actually been seen for rheumatology and a I believe psychologist/psychiatrist at  [facility] She would prefer to go that direction as opposed to [facility] or [facility]. I am definitely okay with that. 03/26/2022 upon evaluation today patient appears to be doing okay in regard to her wound. She is not showing signs of worsening and overall she tells me that her pain is not nearly as bad as it was previous. Fortunately I do not see any evidence of active infection locally or systemically which is great news. No fevers, chills, nausea, vomiting, or diarrhea. 04-02-2022 upon evaluation today patient appears to  be doing well with regard to her wound. It does not appear to be showing any signs of worsening which is great news. With that being said you are still having some issues here with drainage although it is clear and mainly just tenderness with bleeding from the [facility] I do think that treating for the second issue which is MRSA is probably the right thing to do at this point she is now off of the Augmentin. 04-09-2022 upon evaluation today patient's wound actually appears to be doing about the same. Fortunately I do not see any evidence of active infection locally or systemically at this time which is great news. No fevers, chills, nausea, vomiting, or diarrhea. QUENISHA, LOVINS ([id number]) 04-16-2022 upon evaluation today patient appears to be doing well with regard to her wound in the lumbar spine region. Subsequently she continues to have an area that does have a tunnel up into the 12:00 location. This is about 3.5 cm using a skinny probe curved upwards to get into this region. With that being said I really do not see any signs of overall improvement but also do not see any signs of worsening I feel like work on about a still made here to some degree. The patient did see her neurologist and he has referred her to neurosurgery at [facility] for second opinion we will see what they have to say as well. 04-23-2022 upon evaluation today patient appears to be doing well with regard to her  wound all things considered. She has been tolerating the dressing changes without complication. Fortunately I do not see any signs of active infection locally nor systemically which is great news. No fever chills noted 04-30-2022 upon evaluation today patient appears to be doing about the same in regard to her wound. The depth is really not dramatically improved as far as the 12:00 tunnel is concerned. The overall depth and the base of the wound does seem to be a little bit better it does sound like that external wound has closed as far as the opening is concerned we can have to cut this down from a half to a smaller size based on what we are seeing today. 05-07-2022 upon evaluation today patient's wound actually seems to be doing decently well. There is not a major shift here but a slight shift in the depth both straight and as well as in the [place] at 12:00. With that being said I again we will talk about a couple of millimeters but still every little bit can count when there are situations like this to be honest. 05-14-2022 upon evaluation today patient appears to be doing okay in regard to her wound. I do not see any signs of anything worsening. With that being said also do not see getting significantly better unfortunately. There does not appear to be any evidence of infection locally or systemically which is great news. No fevers, chills, nausea, vomiting, or diarrhea. 05-21-2022 upon evaluation today patient actually appears to be doing quite well in regard to her wound from the standpoint of there being no infection. With that being said there does not appear to be any evidence of infection locally nor systemically which is great news. No fevers, chills, nausea, vomiting, or diarrhea. 05-28-2022 upon evaluation today patient's wound actually appears to be doing about the same. I do not see any evidence of active infection locally or systemically which is great news. No fevers, chills, nausea,  vomiting, or diarrhea. 06-04-2022 upon evaluation today patient  appears to be doing well with regard to her wound. She has been tolerating dressing changes without complication. Fortunately I do not feel like there is any worsening also I do not feel like there is any improvement. I feel like right a solid area here where she has a tunnel that tracks up at 12:00 and then has a fluid pocket at around roughly 1:00 I can push on this and squeeze fluid out. Again I am not exactly sure what else can be done from my standpoint to improve this. She does have an appointment with Dr. Franky Macho who I do believe is an excellent surgeon and this is the surgeon who performed this surgery initially. Again the biggest issue was following she had a lot of trouble with the wound VAC I do think that we need to try to see if she is can have a wound VAC following surgery what exactly regular need to do to help keep this moving in the right direction for her. Obviously I want her to have the best result possible that she has to go through surgery again to clean this area out. Absolutely willing to help out with getting this to heal. 06-13-2022 upon evaluation today patient appears to be doing well with regard to her wound its not any worse unfortunately it is also not significantly better which is the only issue currently. I do not see any signs of active infection locally or systemically which is great news. No fevers, chills, nausea, vomiting, or diarrhea. She does have her appointment on Monday with her neurosurgeon and we will see you Dr. Franky Macho has to say at that point. 06-18-2022 upon evaluation patient appears to be doing a little worse in regard to her wound only in the respect that has been several days since she has had this changed in fact it was last changed on Friday. Since that time she tells me that she has kept this in place over the weekend and yesterday she was supposed to see her neurosurgeon yesterday but  they had to cancel due to an emergency surgery according to what the patient tells me. Nonetheless she is having some issues here with drainage coming from the wound that is more purulent in nature I think this is something we have been seeing in the [facility] for several weeks but is more obvious being that it was not changed as much during the last week. She unfortunately is a little worried about this but again I think that this is just part of what we have been dealing with all long is more prevalent and obvious due to the length of time since it was changed last. She does have a repeat appointment for I believe July 10 she told me although she is going to see if she can find anything sooner. 06-25-2022 upon evaluation today patient's wound actually showed signs of marginal improvement which is good news. She still has the area of abscess that seems to be at roughly 2:00 when looking at her back. We can get into this with the skinny probe other than that I really do not know what else I can do to try to make this better. We discussed this and she does have an appointment with her neurosurgeon although that is not till mid July as he had to change it at the last moment during the time she was post to see him a week ago. She does have evidence of potential infection on culture I am going  to go ahead and treat this in order to ensure that nothing worsens. With that being said I do believe that overall she seems to be doing well but I do think we want to make sure that that the knee gets worse in the meantime until she gets to see her neurosurgeon. 07-01-2022 upon evaluation today patient appears to continue to have trouble with her wound draining. Again we have been continue to monitor this and I still see the same issue that I have noted before the patient has an area which is draining seemingly from around the 2:00 location when you go up towards 12:00 and then over towards 2:00 looking at her  back this is where there is actually a soft area external that if you push on it you will see fluid come out and subsequently this is also where it tracks to internally. Everything in the 6:00 location has completely healed in and this looks much better in that regard but this upper portion we just cannot get to closed with the methods that we have. It seemed to me that this is probably going to require that this area be open and cleaned out in order to allow it to heal and my suggestion would be that a wound VAC is probably going to be the ideal thing. 07-09-2022 upon evaluation today patient appears to be doing somewhat poorly in regard to her wound. She did see Dr. Franky Macho. He stated that the wound was not being "packed appropriately" according to the patient and her daughter who were present during the evaluation today as well as the evaluation with him yesterday. With that being said he packed the wound the way he said it needed to be and to be partly honest that is over packed. The patient is having a tremendous amount of discomfort and states that this is no way that she is good to be able to tolerate that. I understand the concern about the whole closing down which is why I recommended trying to keep this open is much as possible and again with that all being said this is still going to continue to be an issue as far as this closing down before everything heals up on the inside. Nonetheless she continues to have issues with significant drainage. She also has an area which almost feels like a small abscess or least of fluid pocket that feels up that was not appropriately packed with the dressings that were in place. With that being said I think that this is going to have to be opened at some point in time in order to get this to heal I am really not certain the best way to go about this. I discussed that with the patient again today. GRACE, VALLEY ([id number]) 07-16-2022 upon evaluation  today patient appears to be doing well currently in regard to her wound from the standpoint of the pain getting better which is great news. Fortunately I do not see any evidence of active infection locally or systemically which is great news. No fevers, chills, nausea, vomiting, or diarrhea. With that being said the patient has been tolerating the dressing changes better without complication which is great news. 07-29-2022 upon evaluation today patient appears to be doing actually pretty well in regard to her wound. Fortunately there does not appear to be any signs of infection. She still has drainage and we still are seeing an area from this pocket up at 12:00 that is leaking but fortunately this does not  appear to be having any significant issues with infection right now which is great news. Fortunately her pain is also calm down since I saw her 2 weeks ago. 08-05-2022 upon evaluation today patient appears to be doing unfortunately a little worse in regard to the discomfort she is feeling she tells me that this is bothering her pretty much all the time at a low level. It never seems to go away. This is since last week. With that being said the overall depth is unfortunately a little bit deeper this week which is definitely not what I was hoping to see. I did get to review the second opinion that was requested by the patient from her insurance to have an independent physician review the situation here. The short story of that reviewed which I did read through the entirety of she states that the patient does likely need surgery to correct the seroma considering length of time this has been going on. It was recommended that a plastic surgeon be the one to have this up in case there is a muscle flap necessary but at minimum that this area needs to be cleaned out and then appropriately close following. The patient has also read through this. She does seem to be a little bit off of her normal game not feeling  quite that well today. She is not exactly sure what is going on. 08-12-2022 upon evaluation today patient's wound is actually showing signs of maintaining stability which is good news. Fortunately there does not appear to be any evidence of active infection locally or systemically at this time which is excellent. With that being said she does have a meeting with her nurse with [facility] [facility] on Thursday to discuss options for plastic surgery where she is able to go. 08-19-2022 upon evaluation today patient appears to be doing well currently in regard to her wound all things considered. Unfortunately the patient is still continuing to have some issues here with an ongoing open wound in the back despite everything that we have tried she has had an independent review which recommended surgical intervention. Where the process of trying to find someone to take her on as a patient. The suggestion was for plastic surgery which I think is probably a good option here. With that being said she has been looking through her insurance to see who was in network for her and has found someone that I think will be very good. Working to see about making that referral to them. 08-26-2022 upon evaluation today patient appears to be doing well currently in regard to her wound. She tells me she is having some discomfort fortunately there does not appear to be any evidence of active infection at this time which is great news. We have actually gotten her an appointment finally with the plastic surgeon that is 4 I believe it is September 26. 09-03-2022 upon evaluation today patient appears to be doing well currently in regard to her wound everything appears to be stable which is good in that regard. With that being said the drainage does appear to be a little more thick compared to normal she has been having a lot of irritation as well. I do believe that it may be at the point that the repeating the Augmentin that she is  previously done well with in the past could be of benefit for her here currently. The patient voiced understanding she has not had this since September 27 the last time I prescribed this for  her. Nonetheless it did do a very good job to calm things down quite a bit back at that time. 09-09-2022 upon evaluation today patient appears to be doing well currently in regard to her wound she is stable. There does not appear to be anything worsening at this point. Fortunately I see no signs of active infection at this time. No fevers, chills, nausea, vomiting, or diarrhea. 09-16-2022 upon evaluation today patient appears to be doing well currently regard to her wound there does not appear to be any signs of active infection locally or systemically at this time which is great news. No fevers, chills, nausea, vomiting, or diarrhea. Objective Constitutional Well-nourished and well-hydrated in no acute distress. Vitals Time Taken: 2:00 PM, Height: 58 in, Weight: 275 lbs, BMI: 57.5, Temperature: 98.1 F, Pulse: 81 bpm, Respiratory Rate: 18 breaths/min, Blood Pressure: 137/82 mmHg. Respiratory normal breathing without difficulty. Psychiatric this patient is able to make decisions and demonstrates good insight into disease process. Alert and Oriented x 3. pleasant and cooperative. General Notes: Upon inspection patient's wound bed actually showed signs of good granulation and epithelization at this point. Fortunately I do not see any evidence of active infection which is great news. In general I think that this wound is not trying to close down too tightly which is good the Hydrofera Blue to keep it open she did have a little bit of bleeding but this seems to be coming from the outer edge of my opinion. Integumentary (Hair, Skin) Wound #1 status is Open. Original cause of wound was Surgical Injury. The date acquired was: 04/11/2021. The wound has been in treatment 48 weeks. The wound is located on the  Distal,Midline Back. The wound measures 0.3cm length x 0.3cm width x 2.4cm depth; 0.071cm^2 area and 0.17cm^3 volume. There is Fat Layer (Subcutaneous Tissue) exposed. There is no tunneling noted, however, there is undermining starting at 9:00 and ending at 3:00 with a maximum distance of 3.2cm. There is a medium amount of serosanguineous drainage noted. There is large (67- Bleicher, Omya C. ([id number]) 100%) red granulation within the wound bed. There is no necrotic tissue within the wound bed. 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 Nurse Visit as needed Home Health: Home Health Company: - [facility] for wound care. May utilize formulary equivalent dressing for wound treatment orders unless otherwise specified. Home Health Nurse may visit PRN to address patient s wound care needs. - Monday and Friday [facility] fax [phone] Bathing/ Shower/ Hygiene: May shower; gently cleanse wound with antibacterial soap, rinse and pat dry prior to dressing wounds No tub bath. Anesthetic (Use 'Patient Medications' Section for Anesthetic Order Entry): Lidocaine applied to wound bed Edema Control - Lymphedema / Segmental Compressive Device / Other: Elevate, Exercise Daily and Avoid Standing for Long Periods of Time. Elevate legs to the level of the heart and pump ankles as often as possible Elevate leg(s) parallel to the floor when sitting. Additional Orders / Instructions: Follow Nutritious Diet and Increase Protein Intake WOUND #1: - Back Wound Laterality: Midline, Distal Cleanser: Normal Saline 3 x Per Week/30 Days Discharge Instructions: Wash your hands with soap and water. Remove old dressing, discard into plastic bag and  place into trash. Cleanse the wound with Normal Saline prior to applying a clean dressing using gauze sponges, not tissues or  cotton balls. Do not scrub or use excessive force. Pat dry using gauze sponges, not tissue or cotton balls. Primary Dressing: Hydrofera Blue Rope 3 x Per Week/30 Days Discharge Instructions: cut into fourths; angle the end Secondary Dressing: (BORDER) Zetuvit Plus SILICONE BORDER Dressing 4x4 (in/in) 3 x Per Week/30 Days 1. I am going to suggest that we go ahead and continue with the recommendation for wound care measures as before and the patient is in agreement the plan. This includes the use of the Hydrofera Blue rope which I do believe is doing well currently. 2. Also can recommend that we continue with the silicone border foam dressing. 3. I am also going to continue to try to manage this as best we can until she has the appointment with plastic surgery to see what they can do to try to get this closed. We will see patient back for reevaluation in 1 week here in the clinic. If anything worsens or changes patient will contact our office for additional recommendations. Electronic Signature(s) Signed: 09/16/2022 2:52:00 PM By: [name] PA-C Entered By: [name] on 09/16/2022 14:52:00 Haros, Domingo Pulse ([id number]) -------------------------------------------------------------------------------- SuperBill Details Patient Name: Navis, Nakoma C. Date of Service: 09/16/2022 Medical Record Number: [id number] Patient Account Number: [id number] Date of Birth/Sex: [birth year] (61 y.o. F) Treating RN: Yevonne Pax Primary Care Provider: [name] Other Clinician: Referring Provider: Card, John Treating Provider/Extender: Rowan Blase in Treatment: 48 Diagnosis Coding ICD-10 Codes Code Description T81.31XA Disruption of external operation (surgical) wound, not elsewhere classified, initial encounter L98.422 Non-pressure chronic ulcer of back with fat  layer exposed G35 Multiple sclerosis I10 Essential (primary) hypertension Facility Procedures CPT4 Code: 08657846 Description: [phone] - WOUND CARE VISIT-LEV 2 EST PT Modifier: Quantity: 1 Physician Procedures CPT4 Code: 2841324 Description: 99213 - WC PHYS LEVEL 3 - EST PT Modifier: Quantity: 1 CPT4 Code: Description: ICD-10 Diagnosis Description T81.31XA Disruption of external operation (surgical) wound, not elsewhere classifi 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

## 2022-09-16 NOTE — Progress Notes (Addendum)
CASY, TAVANO ([id number]) Visit Report for 09/16/2022 Arrival Information Details Patient Name: [name], [name] Date of Service: 09/16/2022 2:00 PM Medical Record Number: [id number] Patient Account Number: [id number] Date of Birth/Sex: [birth year] (61 y.o. F) Treating RN: Carlene Coria Primary Care Miara Emminger: Card, Jenny Reichmann Other Clinician: Referring Nishi Neiswonger: Card, John Treating Lex Linhares/Extender: Skipper Cliche in Treatment: 51 Visit Information History Since Last Visit All ordered tests and consults were completed: No Patient Arrived: [name] Added or deleted any medications: No Arrival Time: 13:55 Any new allergies or adverse reactions: No Accompanied By: self Had a fall or experienced change in No Transfer Assistance: None activities of daily living that may affect Patient Identification Verified: Yes risk of falls: Secondary Verification Process Completed: Yes Signs or symptoms of abuse/neglect since last visito No Patient Requires Transmission-Based Precautions: No Hospitalized since last visit: No Patient Has Alerts: No Implantable device outside of the clinic excluding No cellular tissue based products placed in the center since last visit: Has Dressing in Place as Prescribed: Yes Pain Present Now: No Electronic Signature(s) Signed: 09/16/2022 2:31:54 PM By: Carlene Coria RN Entered By: Carlene Coria on 09/16/2022 14:13:06 Deleonardis, Jora CMarland Kitchen ([id number]) -------------------------------------------------------------------------------- Clinic Level of Care Assessment Details Patient Name: Cerami, Keandria C. Date of Service: 09/16/2022 2:00 PM Medical Record Number: [id number] Patient Account Number: [id number] Date of Birth/Sex: [birth year] (61 y.o. F) Treating RN: Carlene Coria Primary Care Latese Dufault: Card, Jenny Reichmann Other Clinician: Referring Tyberius Ryner: Card, John Treating April Carlyon/Extender: Skipper Cliche in Treatment: 44 Clinic Level of Care Assessment Items TOOL 4  Quantity Score X - Use when only an EandM is performed on FOLLOW-UP visit 1 0 ASSESSMENTS - Nursing Assessment / Reassessment X - Reassessment of Co-morbidities (includes updates in patient status) 1 10 X- 1 5 Reassessment of Adherence to Treatment Plan ASSESSMENTS - Wound and Skin Assessment / Reassessment X - Simple Wound Assessment / Reassessment - one wound 1 5 []  - 0 Complex Wound Assessment / Reassessment - multiple wounds []  - 0 Dermatologic / Skin Assessment (not related to wound area) ASSESSMENTS - Focused Assessment []  - Circumferential Edema Measurements - multi extremities 0 []  - 0 Nutritional Assessment / Counseling / Intervention []  - 0 Lower Extremity Assessment (monofilament, tuning fork, pulses) []  - 0 Peripheral Arterial Disease Assessment (using hand held doppler) ASSESSMENTS - Ostomy and/or Continence Assessment and Care []  - Incontinence Assessment and Management 0 []  - 0 Ostomy Care Assessment and Management (repouching, etc.) PROCESS - Coordination of Care X - Simple Patient / Family Education for ongoing care 1 15 []  - 0 Complex (extensive) Patient / Family Education for ongoing care []  - 0 Staff obtains Programmer, systems, Records, Test Results / Process Orders []  - 0 Staff telephones HHA, Nursing Homes / Clarify orders / etc []  - 0 Routine Transfer to another Facility (non-emergent condition) []  - 0 Routine Hospital Admission (non-emergent condition) []  - 0 New Admissions / Biomedical engineer / Ordering NPWT, Apligraf, etc. []  - 0 Emergency Hospital Admission (emergent condition) X- 1 10 Simple Discharge Coordination []  - 0 Complex (extensive) Discharge Coordination PROCESS - Special Needs []  - Pediatric / Minor Patient Management 0 []  - 0 Isolation Patient Management []  - 0 Hearing / Language / Visual special needs []  - 0 Assessment of Community assistance (transportation, D/C planning, etc.) []  - 0 Additional assistance / Altered  mentation []  - 0 Support Surface(s) Assessment (bed, cushion, seat, etc.) INTERVENTIONS - Wound Cleansing / Measurement Saleeby, Keiri C. ([id number]) X- 1 5 Simple Wound Cleansing -  one wound  - 0 Complex Wound Cleansing - multiple wounds X- 1 5 Wound Imaging (photographs - any number of wounds)  - 0 Wound Tracing (instead of photographs) X- 1 5 Simple Wound Measurement - one wound  - 0 Complex Wound Measurement - multiple wounds INTERVENTIONS - Wound Dressings X - Small Wound Dressing one or multiple wounds 1 10  - 0 Medium Wound Dressing one or multiple wounds  - 0 Large Wound Dressing one or multiple wounds  - 0 Application of Medications - topical  - 0 Application of Medications - injection INTERVENTIONS - Miscellaneous  - External ear exam 0  - 0 Specimen Collection (cultures, biopsies, blood, body fluids, etc.)  - 0 Specimen(s) / Culture(s) sent or taken to Lab for analysis  - 0 Patient Transfer (multiple staff / Nurse, adult / Similar devices)  - 0 Simple Staple / Suture removal (25 or less)  - 0 Complex Staple / Suture removal (26 or more)  - 0 Hypo / Hyperglycemic Management (close monitor of Blood Glucose)  - 0 Ankle / Brachial Index (ABI) - do not check if billed separately X- 1 5 Vital Signs Has the patient been seen at the hospital within the last three years: Yes Total Score: 75 Level Of Care: New/Established - Level 2 Electronic Signature(s) Signed: 09/16/2022 2:31:54 PM By: Yevonne Pax RN Entered By: Yevonne Pax on 09/16/2022 14:16:53 Brees, Jing CMarland Kitchen ([id number]) -------------------------------------------------------------------------------- Encounter Discharge Information Details Patient Name: Yazdani, Lillyen C. Date of Service: 09/16/2022 2:00 PM Medical Record Number: [id number] Patient Account Number: [id number] Date of Birth/Sex: 1961-12-30 (61 y.o. F) Treating RN: Yevonne Pax Primary Care Eulogia Dismore:  [name] Other Clinician: Referring Nhi Butrum: Card, John Treating Andreina Outten/Extender: Rowan Blase in Treatment: 25 Encounter Discharge Information Items Discharge Condition: Stable Ambulatory Status: Walker Discharge Destination: Home Transportation: Private Auto Accompanied By: self Schedule Follow-up Appointment: Yes Clinical Summary of Care: Electronic Signature(s) Signed: 09/16/2022 2:31:54 PM By: Yevonne Pax RN Entered By: Yevonne Pax on 09/16/2022 14:17:53 Ode, Zorina C. ([id number]) -------------------------------------------------------------------------------- Lower Extremity Assessment Details Patient Name: Fong, Madelena C. Date of Service: 09/16/2022 2:00 PM Medical Record Number: [id number] Patient Account Number: [id number] Date of Birth/Sex: 10-May-1961 (61 y.o. F) Treating RN: Yevonne Pax Primary Care Teneisha Gignac: [name] Other Clinician: Referring Sotirios Navarro: Card, John Treating Easten Maceachern/Extender: Rowan Blase in Treatment: 48 Electronic Signature(s) Signed: 09/16/2022 2:31:54 PM By: Yevonne Pax RN Entered By: Yevonne Pax on 09/16/2022 14:15:01 Horsford, Waunetta CMarland Kitchen ([id number]) -------------------------------------------------------------------------------- Multi Wound Chart Details Patient Name: Tillman, Araeya C. Date of Service: 09/16/2022 2:00 PM Medical Record Number: [id number] Patient Account Number: [id number] Date of Birth/Sex: 1961/08/10 (61 y.o. F) Treating RN: Yevonne Pax Primary Care Ellerie Arenz: [name] Other Clinician: Referring Lash Matulich: Card, John Treating Nthony Lefferts/Extender: Rowan Blase in Treatment: 48 Vital Signs Height(in): 58 Pulse(bpm): 81 Weight(lbs): 275 Blood Pressure(mmHg): 137/82 Body Mass Index(BMI): 57.5 Temperature(F): 98.1 Respiratory Rate(breaths/min): 18 Photos: [N/A:N/A] Wound Location: Distal, Midline Back N/A N/A Wounding Event: Surgical Injury N/A N/A Primary Etiology: Dehisced Wound N/A  N/A Comorbid History: Hypertension N/A N/A Date Acquired: 04/11/2021 N/A N/A Weeks of Treatment: 48 N/A N/A Wound Status: Open N/A N/A Wound Recurrence: No N/A N/A Measurements L x W x D (cm) 0.3x0.3x2.4 N/A N/A Area (cm) : 0.071 N/A N/A Volume (cm) : 0.17 N/A N/A % Reduction in Area: 43.70% N/A N/A % Reduction in Volume: 70.60% N/A N/A Classification: Full Thickness Without Exposed N/A N/A Support Structures Exudate Amount: Medium N/A N/A Exudate Type: Serosanguineous N/A N/A Exudate  Color: red, brown N/A N/A Granulation Amount: Large (67-100%) N/A N/A Granulation Quality: Red N/A N/A Necrotic Amount: None Present (0%) N/A N/A Exposed Structures: Fat Layer (Subcutaneous Tissue): N/A N/A Yes Fascia: No Tendon: No Muscle: No Joint: No Bone: No Epithelialization: None N/A N/A Treatment Notes Electronic Signature(s) Signed: 09/16/2022 2:31:54 PM By: Yevonne Pax RN Entered By: Yevonne Pax on 09/16/2022 14:15:35 Dockter, Lakoda Salena Saner ([id number]) -------------------------------------------------------------------------------- Multi-Disciplinary Care Plan Details Patient Name: Bilyeu, Trezure C. Date of Service: 09/16/2022 2:00 PM Medical Record Number: [id number] Patient Account Number: [id number] Date of Birth/Sex: [birth year] (61 y.o. F) Treating RN: Yevonne Pax Primary Care Shalinda Burkholder: [name] Other Clinician: Referring Aubrey Blackard: Card, John Treating Jhoselyn Ruffini/Extender: Rowan Blase in Treatment: 102 Active Inactive Wound/Skin Impairment Nursing Diagnoses: Knowledge deficit related to ulceration/compromised skin integrity Goals: Patient/caregiver will verbalize understanding of skin care regimen Date Initiated: 10/15/2021 Target Resolution Date: 11/16/2022 Goal Status: Active Ulcer/skin breakdown will have a volume reduction of 30% by week 4 Date Initiated: 10/15/2021 Date Inactivated: 01/29/2022 Target Resolution Date: 12/15/2021 Goal Status: Unmet Unmet  Reason: comorbities Ulcer/skin breakdown will have a volume reduction of 50% by week 8 Date Initiated: 10/15/2021 Date Inactivated: 01/29/2022 Target Resolution Date: 01/15/2022 Goal Status: Unmet Unmet Reason: comorbities Ulcer/skin breakdown will have a volume reduction of 80% by week 12 Date Initiated: 10/15/2021 Date Inactivated: 02/19/2022 Target Resolution Date: 02/15/2022 Goal Status: Unmet Unmet Reason: comorbities Ulcer/skin breakdown will heal within 14 weeks Date Initiated: 10/15/2021 Date Inactivated: 05/07/2022 Target Resolution Date: 03/15/2022 Goal Status: Unmet Unmet Reason: comorbities Interventions: Assess patient/caregiver ability to obtain necessary supplies Assess patient/caregiver ability to perform ulcer/skin care regimen upon admission and as needed Assess ulceration(s) every visit Notes: Electronic Signature(s) Signed: 09/16/2022 2:31:54 PM By: Yevonne Pax RN Entered By: Yevonne Pax on 09/16/2022 14:15:17 Tipps, Katelynne C. ([id number]) -------------------------------------------------------------------------------- Pain Assessment Details Patient Name: Gater, Trenice C. Date of Service: 09/16/2022 2:00 PM Medical Record Number: [id number] Patient Account Number: [id number] Date of Birth/Sex: 1961-10-26 (61 y.o. F) Treating RN: Yevonne Pax Primary Care Brayli Klingbeil: [name] Other Clinician: Referring Elaynah Virginia: Card, John Treating Darika Ildefonso/Extender: Rowan Blase in Treatment: 48 Active Problems Location of Pain Severity and Description of Pain Patient Has Paino Yes Site Locations With Dressing Change: Yes Duration of the Pain. Constant / Intermittento Intermittent How Long Does it Lasto Hours: Minutes: 15 Rate the pain. Current Pain Level: 3 Worst Pain Level: 8 Least Pain Level: 0 Tolerable Pain Level: 5 Character of Pain Describe the Pain: Aching, Burning Pain Management and Medication Current Pain Management: Medication: Yes Cold  Application: No Rest: Yes Massage: No Activity: No T.E.N.S.: No Heat Application: No Leg drop or elevation: No Is the Current Pain Management Adequate: Inadequate How does your wound impact your activities of daily livingo Sleep: Yes Bathing: No Appetite: No Relationship With Others: No Bladder Continence: No Emotions: No Bowel Continence: No Work: No Toileting: No Drive: No Dressing: No Hobbies: No Electronic Signature(s) Signed: 09/16/2022 2:31:54 PM By: Yevonne Pax RN Entered By: Yevonne Pax on 09/16/2022 14:14:05 Couzens, Namiko Salena Saner ([id number]) -------------------------------------------------------------------------------- Patient/Caregiver Education Details Patient Name: Cashin, Shiann C. Date of Service: 09/16/2022 2:00 PM Medical Record Number: [id number] Patient Account Number: [id number] Date of Birth/Gender: [birth year] (61 y.o. F) Treating RN: Yevonne Pax Primary Care Physician: [name] Other Clinician: Referring Physician: Card, John Treating Physician/Extender: Rowan Blase in Treatment: 69 Education Assessment Education Provided To: Patient Education Topics Provided Wound/Skin Impairment: Methods: Explain/Verbal Responses: State content correctly Electronic Signature(s) Signed: 09/16/2022 2:31:54 PM By: Yevonne Pax  RN Entered By: Yevonne Pax on 09/16/2022 14:17:13 Pierrelouis, Aylssa CMarland Kitchen ([id number]) -------------------------------------------------------------------------------- Wound Assessment Details Patient Name: Lobosco, Ludy C. Date of Service: 09/16/2022 2:00 PM Medical Record Number: [id number] Patient Account Number: [id number] Date of Birth/Sex: Jul 26, 1961 (61 y.o. F) Treating RN: Yevonne Pax Primary Care Koron Godeaux: Card, Jonny Ruiz Other Clinician: Referring Ichelle Harral: Card, John Treating Abriel Hattery/Extender: Rowan Blase in Treatment: 48 Wound Status Wound Number: 1 Primary Etiology: Dehisced Wound Wound Location: Distal,  Midline Back Wound Status: Open Wounding Event: Surgical Injury Comorbid History: Hypertension Date Acquired: 04/11/2021 Weeks Of Treatment: 48 Clustered Wound: No Photos Wound Measurements Length: (cm) 0.3 Width: (cm) 0.3 Depth: (cm) 2.4 Area: (cm) 0.071 Volume: (cm) 0.17 % Reduction in Area: 43.7% % Reduction in Volume: 70.6% Epithelialization: None Tunneling: No Undermining: Yes Starting Position (o'clock): 9 Ending Position (o'clock): 3 Maximum Distance: (cm) 3.2 Wound Description Classification: Full Thickness Without Exposed Support Structu Exudate Amount: Medium Exudate Type: Serosanguineous Exudate Color: red, brown res Foul Odor After Cleansing: No Slough/Fibrino No Wound Bed Granulation Amount: Large (67-100%) Exposed Structure Granulation Quality: Red Fascia Exposed: No Necrotic Amount: None Present (0%) Fat Layer (Subcutaneous Tissue) Exposed: Yes Tendon Exposed: No Muscle Exposed: No Joint Exposed: No Bone Exposed: No Electronic Signature(s) Signed: 09/16/2022 2:27:36 PM By: Yevonne Pax RN Previous Signature: 09/16/2022 2:26:05 PM Version By: Yevonne Pax RN Entered By: Yevonne Pax on 09/16/2022 14:27:36 Latour, Lajune C. ([id number]) -------------------------------------------------------------------------------- Vitals Details Patient Name: Gillyard, Natisha C. Date of Service: 09/16/2022 2:00 PM Medical Record Number: [id number] Patient Account Number: [id number] Date of Birth/Sex: [birth year] (61 y.o. F) Treating RN: Yevonne Pax Primary Care Kendarious Gudino: [name] Other Clinician: Referring Corah Willeford: Card, John Treating Brett Soza/Extender: Rowan Blase in Treatment: 48 Vital Signs Time Taken: 14:00 Temperature (F): 98.1 Height (in): 58 Pulse (bpm): 81 Weight (lbs): 275 Respiratory Rate (breaths/min): 18 Body Mass Index (BMI): 57.5 Blood Pressure (mmHg): 137/82 Reference Range: 80 - 120 mg / dl Electronic Signature(s) Signed:  09/16/2022 2:31:54 PM By: Yevonne Pax RN Entered By: Yevonne Pax on 09/16/2022 14:13:25

## 2022-09-23 ENCOUNTER — Encounter: Payer: 59 | Admitting: Physician Assistant

## 2022-09-23 DIAGNOSIS — T8131XA Disruption of external operation (surgical) wound, not elsewhere classified, initial encounter: Secondary | ICD-10-CM | POA: Diagnosis not present

## 2022-09-23 NOTE — Progress Notes (Addendum)
**Note [name]-Identified via Obfuscation** MAILI, SHUTTERS ([id number]) Visit Report for 09/23/2022 Chief Complaint Document Details Patient Name: [name], [name] Date of Service: 09/23/2022 2:00 PM Medical Record Number: [id number] Patient Account Number: [id number] Date of Birth/Sex: [birth year] (61 y.o. F) Treating RN: Yevonne Pax Primary Care Provider: [name] Other Clinician: Referring Provider: Card, John Treating Provider/Extender: Rowan Blase in Treatment: 20 Information Obtained from: Patient Chief Complaint Surgical Back Ulcer Electronic Signature(s) Signed: 09/23/2022 2:03:46 PM By: [name] PA-C Entered By: [name] on 09/23/2022 14:03:46 [name], [name] ([id number]) -------------------------------------------------------------------------------- HPI Details Patient Name: [name], [name] Date of Service: 09/23/2022 2:00 PM Medical Record Number: [id number] Patient Account Number: [id number] Date of Birth/Sex: 1961-06-14 (61 y.o. 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 This was actually broken up into 3 separate surgical events. The initial surgical intervention actually was on November 05, 2021 almost a year ago. Subsequently the patient went back in February for a seroma of the area which unfortunately required her to have a repeat surgery to go in and clean this out. And then again this occurred in April where she went back in and again they felt like stitches were coming out and there was an additional seroma. She was placed in a wound VAC initially and then subsequently as it got smaller that  was discontinued. Again right now I will see anything that I think a wound VAC would help with. Nonetheless she definitely has a significant depth to the wound that is going require packing. I actually believe the Hydrofera Blue rope would probably do quite well with this the problem is as much as it is draining she probably needs this to be changed at least every day. She does not really have anyone that can help with that that is the complicating scenario here. With that being said the patient does have a history of multiple sclerosis, hypertension, and again this surgical wound dehiscence in regard to her lumbar spine region. She did have a repeat MRI which was actually completed 10/09/2021. This showed that there was no significant change in the subcutaneous fluid collection/track of the lower lumbar region. This is extending to the level of the fascia unfortunately. This seems to go all the way from the L2 level with a track extending all the way to the fascia at the L4-5 level. Again this is a significant wound and there is significant drainage but does not seem to communicate to the spinal region as far as spinal fluid or otherwise is concerned that is good news. Nonetheless she last saw Dr. Franky Macho who is her neurosurgeon on 10/01/2021 that was when he ordered this last MRI she supposed to see him next week as well. With that being said he did not feel like there was any significant issue there but was not sure why this was not healing that is when he ordered the MRI. They were wanting to make sure that this was packed appropriately by home health unfortunately the main issue currently is that home health is completely out of the picture as the patient has exhausted all the home health that that she gets for a year. She is now in a very difficult  predicament where she does not have anyone to help her change the dressing and to be honest that she is not able to do it herself with the location of  the wound being on the midline lumbar spine region. If she does not have anyone that can help it is probably can to be necessary for her to go to a facility for rehab and daily dressing changes as I feel like daily changes which is much drainage that she is having is going to be necessary. 10/23/2021 upon evaluation today patient appears to be doing decently well in regard to her back ulcer. This does seem to be draining a lot less than what it is been doing in the past. With that being said she still has quite a bit of drainage nonetheless. I do think that given time this should improve least I hope so. The good news is she does have home health coming out 3 days a week were doing it 2 days a week and she is paying someone we can to help. 10/30/2021 upon evaluation today patient appears to be doing okay in regard to her back ulcer this is not draining quite as bad as it was in the beginning but he still has quite a bit of drainage noted. I do believe that the patient would benefit from [place] going ahead forward with attempting a wound VAC using the Hydrofera Blue rope to pack with and then subsequently using the VAC externally to actually suction out and help this to fill- in. I think this is our ideal way to try to get things cleared at this point. As it stands I am not certain that we are really making a progress that we want to see near with doing it in the way we are which is packing with the rope. It is a good dressing but I do think it is insufficient for total healing. She just seems to have too much in the way of drainage at this point unfortunately. 11/06/2021 upon evaluation today patient unfortunately continues to have issues with her back ongoing. The good news is her MRI that was repeated showed signs of the size of this area in the lumbar spine region having decreased from 4 cm to 3.5 cm this is definitely not bad news at all. With that being said unfortunately she continues to have issues  with ongoing drainage not as severe as in the beginning but nonetheless still significant. I do think a wound VAC still would be a good way to go although her home health agency nurse apparently has some concerns about the possibility of not being able to keep a seal with this as they had struggles in the past. Nonetheless I explained to the patient that this is much different than what she had previous and that I really feel like it would do much better as far as getting the area taken care of without having any complications or issues here. I think that we should be able to maintain a seal. Nonetheless at this time I did discuss with the patient as well that she probably does need to have a wound VAC in order for [place] to get this moving in the right direction. 11/13/2021 upon evaluation patient's wound bed actually showed signs of significant drainage at this time. She did see the surgeon yesterday he did not see anything that appeared to be infected. Nonetheless he does appear that she is continuing to have areas here that just do not seem  to want to seal up there MRI findings have been negative but nonetheless she continues to have is the seroma that is filling in. I do feel like we need to try to widen the hole so we can get at least a half of the [facility] then this will be better than nothing at this point. 11/20/2021 upon evaluation today patient actually appears to be having less pain at this point which is good news and overall she we still do not have the results of the culture back yet it had to be sent out to [facility] and we do not have the result back yet. Is doing decently well in regard to her wound. Fortunately there does not appear to be any signs of active infection systemically nor locally at this time. 11/27/2021 upon evaluation today patient appears to be doing well with regard to her wound all things considered there does appear to be less drainage than there was previous.  Fortunately I do not see any evidence of worsening of the patient is stating that she is having some issues with back pain. This is somewhat new. Again this I think could be related to the fact that she is having some issues here with infection. We are still waiting to see what the result of her culture shows from susceptibility testing Enterococcus has been identified but we do not know if this is VRE or not. 12/04/2021 upon evaluation today patient appears to be doing well with regard to her wound all things considered. I did have a conversation with Erin from Dr. Sueanne Margarita office. Of note she notes that Dr. Drinda Butts really does not want to do anything surgical right now which I completely understand. With that being said I am still leery of how far we will make it getting this to heal short of any type of surgery to open this up and allow [place] to more appropriately packed the wound. Nonetheless I will absolutely give it our best shot as far as that is concerned. I discussed that with the patient today. She voiced understanding. The good news is the drainage today seems to be clear it is no longer cloudy as it was previous I am actually very pleased in that regard. [name], [name] ([id number]) 12/11/2021 since have last seen the patient actually did have an conversation with Dr. Franky Macho with her neurosurgeon. Again this involve the discussion around whether or not to open the wound and try to apply a wound VAC following. With that being said the decision was made that that may be the best thing to do if the patient was in agreement. Nonetheless I am actually extremely encouraged with what I am seeing today much more than I would have thought. In fact I think that we may be over packing the wound which is why she is having some discomfort at this point as there is much less of the dressing able to get and this time compared to what we saw previous. That is actually really good news. In fact the 6:00  tunnel appears to have filled in is awesome news. At the 12:00 location I am actually able to pack into that area pretty effectively at this point today. In fact I was able to get less of the packing in which I will detail below. 12/18/2021 upon evaluation today patient appears to be doing well with regard to her wound on the back. Fortunately there is no signs of active infection which is great news and overall  very pleased with where things stand today. No fevers, chills, nausea, vomiting, or diarrhea. 01/01/2022 upon evaluation today patient appears to be doing decently well in regard to her wound. Fortunately there does not appear to be any signs of anything worsening which is great news. She still continues to have issues with drainage but this is not nearly as significant as what it was in the past. There is all clear drainage no signs of purulence noted. 01/08/2022 upon evaluation today patient appears to be doing well with regard to her wound. With that being said she tells me that she has been having some increased pain over the past week. It does appear based on the amount of Hydrofera Blue that we removed this is probably over pack. Again it is difficult to know exactly how old the nurses getting all the skin but nonetheless the length of Hydrofera Blue that was utilized was way too much which may be part of the reason why this felt so uncomfortable. Fortunately I think that we can cut back on that we discussed pieces smaller so hopefully that will not be over packed. 01/22/2021 upon evaluation today patient appears to be doing well With regard to there being no signs of infection at this time. The wound does still tunnel mainly up at the 12:00 location. The depth of the tunnel complete from entry point to the base is about 3.5 cm today. With that being said I do believe that overall she is making good progress this is just a very slow process for her which I know has been frustrating as  well. 01/29/2022 upon evaluation today patient appears to be doing well with regard to the wound on her lower back and the lumbar spine region. The good news is the depth that I got this week was a little bit less going up at the 12:00 location as compared to last week. I measured this to be 3.5 cm last week and it was 3.3 cm this week. Again that is a minute shift but nonetheless a good shift in the right direction. Overall I think that we are on the right track. 02/05/2022 upon evaluation today patient appears to be doing well with regard to her wound. In fact right now her measurements are somewhere around 2.9 cm which is definitely smaller and less deep than last week At the 12:00 location. Overall I am very pleased with what we are seeing. 02/19/2022 upon evaluation today patient unfortunately is noting that the wound is actually closing up externally but internally there is still some depth to this. I discussed with her today that we cannot let it close up like this is can end up being a significant issue for her if we allow for that. Subsequently we need to do what we can to try to open this up and keep it open more effectively. She voiced understanding. With that being said we are going to proceed with that procedure today in order to debride away some of the skin on externally that is trying to close and on this. 02/26/2022 upon evaluation patient appears to be doing better in my opinion after we had to open up this area last week. Again she has a significant amount of healing and overall I am extremely pleased with where things stand currently. I do not see any evidence of active infection locally nor systemically at this time which is great news and in general I think that we are on the right track for getting  this hopefully closed final and done. 03/05/2022 upon evaluation today patient unfortunately is doing a little bit worse with regard to the whole size this is starting to get much  smaller unfortunately. There does not appear to be any signs of active infection locally nor systemically which is great news. With that being said I am concerned about the fact that the patient is doing quite a bit worse with regard to trapping some fluid and almost feels like she may have a fluid pocket that not only goes in and up towards 12:00 but looks back around towards the more superficial subcutaneous tissue. I am very concerned that this is going to be something that is very hard for [place] to heal short of another surgery to open this up and try to wound VAC from the inside out. Even then there is no guarantees this is just a very difficult situation to be perfectly honest. 03/14/2022 upon evaluation today patient appears to be doing well with regard to her wound as far as the area staying open and pleased in that regard. With that being said unfortunately she is not doing as well when it comes to the irritation around it appears to be very inflamed and red this is not good that was discussed with her today as well. Subsequently I do believe that working to need to do what we can do to try and calm this down in the past she did well with the Augmentin due to Enterococcus infection I am not sure if were dealing with the same thing or not but at the same time I think that is probably to be my go to at this point. 03/19/2022 upon evaluation today patient appears to be doing really about the same in regard to her wound that the pain is better. 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 She seems to be improved dramatically with the antibiotic currently although it does not cover for MRSA I am apt to just see how this progresses over the next several days to a week. With that being said the bigger issue here is that we simply do not seem to be making excellent progress and I am concerned about the lack of progress. I discussed with Dr. Shon Baton with [facility] in [place]  and to be honest his recommendation was that the best bet would probably be to get her to a teaching center. She is actually been seen for rheumatology and a I believe psychologist/psychiatrist at [facility]. She would prefer to go that direction as opposed to [facility] or [facility]. I am definitely okay with that. 03/26/2022 upon evaluation today patient appears to be doing okay in regard to her wound. She is not showing signs of worsening and overall she tells me that her pain is not nearly as bad as it was previous. Fortunately I do not see any evidence of active infection locally or systemically which is great news. No fevers, chills, nausea, vomiting, or diarrhea. 04-02-2022 upon evaluation today patient appears to be doing well with regard to her wound. It does not appear to be showing any signs of worsening which is great news. With that being said you are still having some issues here with drainage although it is clear and mainly just tenderness with bleeding from the [facility] I do think that treating for the second issue which is MRSA is probably the right thing to do at this point she is now off of the Augmentin. 04-09-2022 upon  evaluation today patient's wound actually appears to be doing about the same. Fortunately I do not see any evidence of active infection locally or systemically at this time which is great news. No fevers, chills, nausea, vomiting, or diarrhea. 04-16-2022 upon evaluation today patient appears to be doing well with regard to her wound in the lumbar spine region. Subsequently she continues to have an area that does have a tunnel up into the 12:00 location. This is about 3.5 cm using a skinny probe curved upwards to get into this region. With that being said I really do not see any signs of overall improvement but also do not see any signs of worsening I feel like work on about a still made here to some degree. The patient did see her neurologist and he has referred her to  neurosurgery at [facility] for second [name], [name]. ([id number]) opinion we will see what they have to say as well. 04-23-2022 upon evaluation today patient appears to be doing well with regard to her wound all things considered. She has been tolerating the dressing changes without complication. Fortunately I do not see any signs of active infection locally nor systemically which is great news. No fever chills noted 04-30-2022 upon evaluation today patient appears to be doing about the same in regard to her wound. The depth is really not dramatically improved as far as the 12:00 tunnel is concerned. The overall depth and the base of the wound does seem to be a little bit better it does sound like that external wound has closed as far as the opening is concerned we can have to cut this down from a half to a smaller size based on what we are seeing today. 05-07-2022 upon evaluation today patient's wound actually seems to be doing decently well. There is not a major shift here but a slight shift in the depth both straight and as well as in the [place] at 12:00. With that being said I again we will talk about a couple of millimeters but still every little bit can count when there are situations like this to be honest. 05-14-2022 upon evaluation today patient appears to be doing okay in regard to her wound. I do not see any signs of anything worsening. With that being said also do not see getting significantly better unfortunately. There does not appear to be any evidence of infection locally or systemically which is great news. No fevers, chills, nausea, vomiting, or diarrhea. 05-21-2022 upon evaluation today patient actually appears to be doing quite well in regard to her wound from the standpoint of there being no infection. With that being said there does not appear to be any evidence of infection locally nor systemically which is great news. No fevers, chills, nausea, vomiting, or diarrhea. 05-28-2022  upon evaluation today patient's wound actually appears to be doing about the same. I do not see any evidence of active infection locally or systemically which is great news. No fevers, chills, nausea, vomiting, or diarrhea. 06-04-2022 upon evaluation today patient appears to be doing well with regard to her wound. She has been tolerating dressing changes without complication. Fortunately I do not feel like there is any worsening also I do not feel like there is any improvement. I feel like right a solid area here where she has a tunnel that tracks up at 12:00 and then has a fluid pocket at around roughly 1:00 I can push on this and squeeze fluid out. Again I am not exactly sure  what else can be done from my standpoint to improve this. She does have an appointment with Dr. Christella Noa who I do believe is an excellent surgeon and this is the surgeon who performed this surgery initially. Again the biggest issue was following she had a lot of trouble with the wound VAC I do think that we need to try to see if she is can have a wound VAC following surgery what exactly regular need to do to help keep this moving in the right direction for her. Obviously I want her to have the best result possible that she has to go through surgery again to clean this area out. Absolutely willing to help out with getting this to heal. 06-13-2022 upon evaluation today patient appears to be doing well with regard to her wound its not any worse unfortunately it is also not significantly better which is the only issue currently. I do not see any signs of active infection locally or systemically which is great news. No fevers, chills, nausea, vomiting, or diarrhea. She does have her appointment on Monday with her neurosurgeon and we will see you Dr. Christella Noa has to say at that point. 06-18-2022 upon evaluation patient appears to be doing a little worse in regard to her wound only in the respect that has been several days since she has had  this changed in fact it was last changed on Friday. Since that time she tells me that she has kept this in place over the weekend and yesterday she was supposed to see her neurosurgeon yesterday but they had to cancel due to an emergency surgery according to what the patient tells me. Nonetheless she is having some issues here with drainage coming from the wound that is more purulent in nature I think this is something we have been seeing in the [facility] for several weeks but is more obvious being that it was not changed as much during the last week. She unfortunately is a little worried about this but again I think that this is just part of what we have been dealing with all long is more prevalent and obvious due to the length of time since it was changed last. She does have a repeat appointment for I believe July 10 she told me although she is going to see if she can find anything sooner. 06-25-2022 upon evaluation today patient's wound actually showed signs of marginal improvement which is good news. She still has the area of abscess that seems to be at roughly 2:00 when looking at her back. We can get into this with the skinny probe other than that I really do not know what else I can do to try to make this better. We discussed this and she does have an appointment with her neurosurgeon although that is not till mid July as he had to change it at the last moment during the time she was post to see him a week ago. She does have evidence of potential infection on culture I am going to go ahead and treat this in order to ensure that nothing worsens. With that being said I do believe that overall she seems to be doing well but I do think we want to make sure that that the knee gets worse in the meantime until she gets to see her neurosurgeon. 07-01-2022 upon evaluation today patient appears to continue to have trouble with her wound draining. Again we have been continue to monitor this and I still  see  the same issue that I have noted before the patient has an area which is draining seemingly from around the 2:00 location when you go up towards 12:00 and then over towards 2:00 looking at her back this is where there is actually a soft area external that if you push on it you will see fluid come out and subsequently this is also where it tracks to internally. Everything in the 6:00 location has completely healed in and this looks much better in that regard but this upper portion we just cannot get to closed with the methods that we have. It seemed to me that this is probably going to require that this area be open and cleaned out in order to allow it to heal and my suggestion would be that a wound VAC is probably going to be the ideal thing. 07-09-2022 upon evaluation today patient appears to be doing somewhat poorly in regard to her wound. She did see Dr. Franky Macho. He stated that the wound was not being "packed appropriately" according to the patient and her daughter who were present during the evaluation today as well as the evaluation with him yesterday. With that being said he packed the wound the way he said it needed to be and to be partly honest that is over packed. The patient is having a tremendous amount of discomfort and states that this is no way that she is good to be able to tolerate that. I understand the concern about the whole closing down which is why I recommended trying to keep this open is much as possible and again with that all being said this is still going to continue to be an issue as far as this closing down before everything heals up on the inside. Nonetheless she continues to have issues with significant drainage. She also has an area which almost feels like a small abscess or least of fluid pocket that feels up that was not appropriately packed with the dressings that were in place. With that being said I think that this is going to have to be opened at some point in  time in order to get this to heal I am really not certain the best way to go about this. I discussed that with the patient again today. 07-16-2022 upon evaluation today patient appears to be doing well currently in regard to her wound from the standpoint of the pain getting better which is great news. Fortunately I do not see any evidence of active infection locally or systemically which is great news. No fevers, chills, nausea, vomiting, or diarrhea. With that being said the patient has been tolerating the dressing changes better without complication which is great news. [name], DECKARD ([id number]) 07-29-2022 upon evaluation today patient appears to be doing actually pretty well in regard to her wound. Fortunately there does not appear to be any signs of infection. She still has drainage and we still are seeing an area from this pocket up at 12:00 that is leaking but fortunately this does not appear to be having any significant issues with infection right now which is great news. Fortunately her pain is also calm down since I saw her 2 weeks ago. 08-05-2022 upon evaluation today patient appears to be doing unfortunately a little worse in regard to the discomfort she is feeling she tells me that this is bothering her pretty much all the time at a low level. It never seems to go away. This is since last week. With that being  said the overall depth is unfortunately a little bit deeper this week which is definitely not what I was hoping to see. I did get to review the second opinion that was requested by the patient from her insurance to have an independent physician review the situation here. The short story of that reviewed which I did read through the entirety of she states that the patient does likely need surgery to correct the seroma considering length of time this has been going on. It was recommended that a plastic surgeon be the one to have this up in case there is a muscle flap necessary but at  minimum that this area needs to be cleaned out and then appropriately close following. The patient has also read through this. She does seem to be a little bit off of her normal game not feeling quite that well today. She is not exactly sure what is going on. 08-12-2022 upon evaluation today patient's wound is actually showing signs of maintaining stability which is good news. Fortunately there does not appear to be any evidence of active infection locally or systemically at this time which is excellent. With that being said she does have a meeting with her nurse with [place] [facility] on Thursday to discuss options for plastic surgery where she is able to go. 08-19-2022 upon evaluation today patient appears to be doing well currently in regard to her wound all things considered. Unfortunately the patient is still continuing to have some issues here with an ongoing open wound in the back despite everything that we have tried she has had an independent review which recommended surgical intervention. Where the process of trying to find someone to take her on as a patient. The suggestion was for plastic surgery which I think is probably a good option here. With that being said she has been looking through her insurance to see who was in network for her and has found someone that I think will be very good. Working to see about making that referral to them. 08-26-2022 upon evaluation today patient appears to be doing well currently in regard to her wound. She tells me she is having some discomfort fortunately there does not appear to be any evidence of active infection at this time which is great news. We have actually gotten her an appointment finally with the plastic surgeon that is 4 I believe it is September 26. 09-03-2022 upon evaluation today patient appears to be doing well currently in regard to her wound everything appears to be stable which is good in that regard. With that being said the drainage  does appear to be a little more thick compared to normal she has been having a lot of irritation as well. I do believe that it may be at the point that the repeating the Augmentin that she is previously done well with in the past could be of benefit for her here currently. The patient voiced understanding she has not had this since September 27 the last time I prescribed this for her. Nonetheless it did do a very good job to calm things down quite a bit back at that time. 09-09-2022 upon evaluation today patient appears to be doing well currently in regard to her wound she is stable. There does not appear to be anything worsening at this point. Fortunately I see no signs of active infection at this time. No fevers, chills, nausea, vomiting, or diarrhea. 09-16-2022 upon evaluation today patient appears to be doing well currently regard to  her wound there does not appear to be any signs of active infection locally or systemically at this time which is great news. No fevers, chills, nausea, vomiting, or diarrhea. 09-23-2022 upon evaluation today patient appears to be doing well currently in regard to her wound all things considered this is still stable although not significantly better. There is still some purulent drainage noted again she has been on antibiotics this continues to be of concern. Fortunately there does not appear to be any evidence of active infection locally or systemically at this time which is great news. Electronic Signature(s) Signed: 09/23/2022 2:20:21 PM By: [name] PA-C Entered By: [name] on 09/23/2022 14:20:21 [name], [name] ([id number]) -------------------------------------------------------------------------------- Physical Exam Details Patient Name: Otani, Chey C. Date of Service: 09/23/2022 2:00 PM Medical Record Number: [id number] Patient Account Number: [id number] Date of Birth/Sex: 01/19/1961 (62 y.o. F) Treating RN: Yevonne Pax Primary Care  Provider: [name] Other Clinician: Referring Provider: Card, John Treating Provider/Extender: Rowan Blase in Treatment: 81 Constitutional Well-nourished and well-hydrated in no acute distress. Respiratory normal breathing without difficulty. Psychiatric this patient is able to make decisions and demonstrates good insight into disease process. Alert and Oriented x 3. pleasant and cooperative. Notes Upon inspection patient's wound bed actually showed signs of good granulation and epithelization at this point. Fortunately I do not see any evidence of active infection currently which is great news and overall I am extremely pleased with where we stand at this time. Electronic Signature(s) Signed: 09/23/2022 2:20:38 PM By: [name] PA-C Entered By: [name] on 09/23/2022 14:20:38 Busenbark, [name] ([id number]) -------------------------------------------------------------------------------- Physician Orders Details Patient Name: [name], [name] Date of Service: 09/23/2022 2:00 PM Medical Record Number: [id number] Patient Account Number: [id number] Date of Birth/Sex: 1961/09/04 (61 y.o. 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 o Nurse Visit as needed Home Health o Home Health Company: [facility] o [facility] for wound care. May utilize formulary equivalent dressing for wound treatment orders unless otherwise specified. Home Health Nurse may visit PRN to  address patientos wound care needs. - Monday and Friday [facility] fax [phone] Bathing/ Shower/ Hygiene o May shower; gently cleanse wound with antibacterial soap, rinse and pat dry prior to dressing wounds o No tub bath. Anesthetic (Use 'Patient Medications' Section for Anesthetic Order Entry) o Lidocaine applied to wound bed Edema Control - Lymphedema / Segmental Compressive Device / Other o Elevate, Exercise Daily and Avoid Standing for Long Periods of Time. o Elevate legs to the level of the heart and pump ankles as often as possible o Elevate leg(s) parallel to the floor when sitting. Additional Orders / Instructions o Follow Nutritious Diet and Increase Protein Intake Wound Treatment Wound #1 - Back Wound Laterality: Midline, Distal Cleanser: Normal Saline 3 x Per Week/30 Days Discharge Instructions: Wash your hands with soap and water. Remove old dressing, discard into plastic bag and place into trash. Cleanse the wound with Normal Saline prior to applying a clean dressing using gauze sponges, not tissues or cotton balls. Do not scrub or use excessive force. Pat dry using gauze sponges,  not tissue or cotton balls. Primary Dressing: Hydrofera Blue Rope 3 x Per Week/30 Days Discharge Instructions: cut into fourths; angle the end Secondary Dressing: (BORDER) Zetuvit Plus SILICONE BORDER Dressing 4x4 (in/in) 3 x Per Week/30 Days Electronic Signature(s) Signed: 09/23/2022 4:20:15 PM By: Yevonne Pax RN Signed: 09/23/2022 4:44:22 PM By: [name] PA-C Entered By: Yevonne Pax on 09/23/2022 14:13:58 [name], [name] ([id number]) -------------------------------------------------------------------------------- Problem List Details Patient Name: [name], [name] Date of Service: 09/23/2022 2:00 PM Medical Record Number: [id number] Patient Account Number: [id number] Date of Birth/Sex: 1961-03-07 (61 y.o. 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 ([id number]) -------------------------------------------------------------------------------- Progress Note Details Patient Name: [name], [name] Date of Service: 09/23/2022 2:00 PM Medical Record Number: [id number] Patient Account Number: [id number] Date of Birth/Sex: [birth year] (61 y.o. 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 This was actually broken up into 3 separate surgical events. The initial surgical intervention actually was on November 05, 2021 almost a year ago. Subsequently the patient went back in February for a seroma of the area which unfortunately required her to have a repeat surgery to go in and clean this out. And then again this occurred in April where she went back in and again they felt like stitches were coming out and there was an  additional seroma. She was placed in a wound VAC initially and then subsequently as it got smaller that was discontinued. Again right now I will see anything that I think a wound VAC would help with. Nonetheless she definitely has a significant depth to the wound that is going require packing. I actually believe the Hydrofera Blue rope would probably do quite well with this the problem is as much as it is draining she probably needs this to be changed at least every day. She does not really have anyone that can help with that that is the complicating scenario here. With that being said the patient does have a history of multiple sclerosis, hypertension, and again this surgical wound dehiscence in regard to her lumbar spine region. She did have a repeat MRI which was actually completed 10/09/2021. This showed that there was no significant change in the subcutaneous fluid collection/track of the lower lumbar region. This is extending to the level of the fascia unfortunately. This seems to go all the way from the L2 level with a track extending all the way to the fascia at the L4-5 level. Again this is a significant wound and there is significant drainage but does not seem to communicate to the spinal region as far as spinal fluid or otherwise is  concerned that is good news. Nonetheless she last saw Dr. Franky Macho who is her neurosurgeon on 10/01/2021 that was when he ordered this last MRI she supposed to see him next week as well. With that being said he did not feel like there was any significant issue there but was not sure why this was not healing that is when he ordered the MRI. They were wanting to make sure that this was packed appropriately by home health unfortunately the main issue currently is that home health is completely out of the picture as the patient has exhausted all the home health that that she gets for a year. She is now in a very difficult predicament where she does not have anyone to  help her change the dressing and to be honest that she is not able to do it herself with the location of the wound being on the midline lumbar spine region. If she does not have anyone that can help it is probably can to be necessary for her to go to a facility for rehab and daily dressing changes as I feel like daily changes which is much drainage that she is having is going to be necessary. 10/23/2021 upon evaluation today patient appears to be doing decently well in regard to her back ulcer. This does seem to be draining a lot less than what it is been doing in the past. With that being said she still has quite a bit of drainage nonetheless. I do think that given time this should improve least I hope so. The good news is she does have home health coming out 3 days a week were doing it 2 days a week and she is paying someone we can to help. 10/30/2021 upon evaluation today patient appears to be doing okay in regard to her back ulcer this is not draining quite as bad as it was in the beginning but he still has quite a bit of drainage noted. I do believe that the patient would benefit from [place] going ahead forward with attempting a wound VAC using the Hydrofera Blue rope to pack with and then subsequently using the VAC externally to actually suction out and help this to fill- in. I think this is our ideal way to try to get things cleared at this point. As it stands I am not certain that we are really making a progress that we want to see near with doing it in the way we are which is packing with the rope. It is a good dressing but I do think it is insufficient for total healing. She just seems to have too much in the way of drainage at this point unfortunately. 11/06/2021 upon evaluation today patient unfortunately continues to have issues with her back ongoing. The good news is her MRI that was repeated showed signs of the size of this area in the lumbar spine region having decreased from 4 cm to 3.5 cm  this is definitely not bad news at all. With that being said unfortunately she continues to have issues with ongoing drainage not as severe as in the beginning but nonetheless still significant. I do think a wound VAC still would be a good way to go although her home health agency nurse apparently has some concerns about the possibility of not being able to keep a seal with this as they had struggles in the past. Nonetheless I explained to the patient that this is much different than what she had previous and that I  really feel like it would do much better as far as getting the area taken care of without having any complications or issues here. I think that we should be able to maintain a seal. Nonetheless at this time I did discuss with the patient as well that she probably does need to have a wound VAC in order for [place] to get this moving in the right direction. 11/13/2021 upon evaluation patient's wound bed actually showed signs of significant drainage at this time. She did see the surgeon yesterday he did not see anything that appeared to be infected. Nonetheless he does appear that she is continuing to have areas here that just do not seem to want to seal up there MRI findings have been negative but nonetheless she continues to have is the seroma that is filling in. I do feel like we need to try to widen the hole so we can get at least a half of the [facility] then this will be better than nothing at this point. 11/20/2021 upon evaluation today patient actually appears to be having less pain at this point which is good news and overall she we still do not have the results of the culture back yet it had to be sent out to [facility] and we do not have the result back yet. Is doing decently well in regard to her wound. Fortunately there does not appear to be any signs of active infection systemically nor locally at this time. 11/27/2021 upon evaluation today patient appears to be doing well with regard  to her wound all things considered there does appear to be less drainage than there was previous. Fortunately I do not see any evidence of worsening of the patient is stating that she is having some issues with back pain. This is somewhat new. Again this I think could be related to the fact that she is having some issues here with infection. We are still waiting to see what the result of her culture shows from susceptibility testing Enterococcus has been identified but we do not know if this is VRE or not. 12/04/2021 upon evaluation today patient appears to be doing well with regard to her wound all things considered. I did have a conversation with Erin from Dr. Sueanne Margarita office. Of note she notes that Dr. Drinda Butts really does not want to do anything surgical right now which I completely [name], [name] ([id number]) understand. With that being said I am still leery of how far we will make it getting this to heal short of any type of surgery to open this up and allow [place] to more appropriately packed the wound. Nonetheless I will absolutely give it our best shot as far as that is concerned. I discussed that with the patient today. She voiced understanding. The good news is the drainage today seems to be clear it is no longer cloudy as it was previous I am actually very pleased in that regard. 12/11/2021 since have last seen the patient actually did have an conversation with Dr. Franky Macho with her neurosurgeon. Again this involve the discussion around whether or not to open the wound and try to apply a wound VAC following. With that being said the decision was made that that may be the best thing to do if the patient was in agreement. Nonetheless I am actually extremely encouraged with what I am seeing today much more than I would have thought. In fact I think that we may be over packing the wound which is  why she is having some discomfort at this point as there is much less of the dressing able to get  and this time compared to what we saw previous. That is actually really good news. In fact the 6:00 tunnel appears to have filled in is awesome news. At the 12:00 location I am actually able to pack into that area pretty effectively at this point today. In fact I was able to get less of the packing in which I will detail below. 12/18/2021 upon evaluation today patient appears to be doing well with regard to her wound on the back. Fortunately there is no signs of active infection which is great news and overall very pleased with where things stand today. No fevers, chills, nausea, vomiting, or diarrhea. 01/01/2022 upon evaluation today patient appears to be doing decently well in regard to her wound. Fortunately there does not appear to be any signs of anything worsening which is great news. She still continues to have issues with drainage but this is not nearly as significant as what it was in the past. There is all clear drainage no signs of purulence noted. 01/08/2022 upon evaluation today patient appears to be doing well with regard to her wound. With that being said she tells me that she has been having some increased pain over the past week. It does appear based on the amount of Hydrofera Blue that we removed this is probably over pack. Again it is difficult to know exactly how old the nurses getting all the skin but nonetheless the length of Hydrofera Blue that was utilized was way too much which may be part of the reason why this felt so uncomfortable. Fortunately I think that we can cut back on that we discussed pieces smaller so hopefully that will not be over packed. 01/22/2021 upon evaluation today patient appears to be doing well With regard to there being no signs of infection at this time. The wound does still tunnel mainly up at the 12:00 location. The depth of the tunnel complete from entry point to the base is about 3.5 cm today. With that being said I do believe that overall she is  making good progress this is just a very slow process for her which I know has been frustrating as well. 01/29/2022 upon evaluation today patient appears to be doing well with regard to the wound on her lower back and the lumbar spine region. The good news is the depth that I got this week was a little bit less going up at the 12:00 location as compared to last week. I measured this to be 3.5 cm last week and it was 3.3 cm this week. Again that is a minute shift but nonetheless a good shift in the right direction. Overall I think that we are on the right track. 02/05/2022 upon evaluation today patient appears to be doing well with regard to her wound. In fact right now her measurements are somewhere around 2.9 cm which is definitely smaller and less deep than last week At the 12:00 location. Overall I am very pleased with what we are seeing. 02/19/2022 upon evaluation today patient unfortunately is noting that the wound is actually closing up externally but internally there is still some depth to this. I discussed with her today that we cannot let it close up like this is can end up being a significant issue for her if we allow for that. Subsequently we need to do what we can to try to open  this up and keep it open more effectively. She voiced understanding. With that being said we are going to proceed with that procedure today in order to debride away some of the skin on externally that is trying to close and on this. 02/26/2022 upon evaluation patient appears to be doing better in my opinion after we had to open up this area last week. Again she has a significant amount of healing and overall I am extremely pleased with where things stand currently. I do not see any evidence of active infection locally nor systemically at this time which is great news and in general I think that we are on the right track for getting this hopefully closed final and done. 03/05/2022 upon evaluation today patient  unfortunately is doing a little bit worse with regard to the whole size this is starting to get much smaller unfortunately. There does not appear to be any signs of active infection locally nor systemically which is great news. With that being said I am concerned about the fact that the patient is doing quite a bit worse with regard to trapping some fluid and almost feels like she may have a fluid pocket that not only goes in and up towards 12:00 but looks back around towards the more superficial subcutaneous tissue. I am very concerned that this is going to be something that is very hard for [place] to heal short of another surgery to open this up and try to wound VAC from the inside out. Even then there is no guarantees this is just a very difficult situation to be perfectly honest. 03/14/2022 upon evaluation today patient appears to be doing well with regard to her wound as far as the area staying open and pleased in that regard. With that being said unfortunately she is not doing as well when it comes to the irritation around it appears to be very inflamed and red this is not good that was discussed with her today as well. Subsequently I do believe that working to need to do what we can do to try and calm this down in the past she did well with the Augmentin due to Enterococcus infection I am not sure if were dealing with the same thing or not but at the same time I think that is probably to be my go to at this point. 03/19/2022 upon evaluation today patient appears to be doing really about the same in regard to her wound that the pain is better. 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 She seems to be improved dramatically with the antibiotic currently although it does not cover for MRSA I am apt to just see how this progresses over the next several days to a week. With that being said the bigger issue here is that we simply do not seem to be making excellent progress and I am  concerned about the lack of progress. I discussed with Dr. Shon Baton with [facility] in [place] and to be honest his recommendation was that the best bet would probably be to get her to a teaching center. She is actually been seen for rheumatology and a I believe psychologist/psychiatrist at [facility]. She would prefer to go that direction as opposed to [facility] or [facility]. I am definitely okay with that. 03/26/2022 upon evaluation today patient appears to be doing okay in regard to her wound. She is not showing signs of worsening and overall she tells me that her pain is not nearly as  bad as it was previous. Fortunately I do not see any evidence of active infection locally or systemically which is great news. No fevers, chills, nausea, vomiting, or diarrhea. 04-02-2022 upon evaluation today patient appears to be doing well with regard to her wound. It does not appear to be showing any signs of worsening which is great news. With that being said you are still having some issues here with drainage although it is clear and mainly just tenderness with bleeding from the [facility] I do think that treating for the second issue which is MRSA is probably the right thing to do at this point she is now off of the Augmentin. 04-09-2022 upon evaluation today patient's wound actually appears to be doing about the same. Fortunately I do not see any evidence of active infection locally or systemically at this time which is great news. No fevers, chills, nausea, vomiting, or diarrhea. [name], [name] ([id number]) 04-16-2022 upon evaluation today patient appears to be doing well with regard to her wound in the lumbar spine region. Subsequently she continues to have an area that does have a tunnel up into the 12:00 location. This is about 3.5 cm using a skinny probe curved upwards to get into this region. With that being said I really do not see any signs of overall improvement but also do not see any signs of worsening I  feel like work on about a still made here to some degree. The patient did see her neurologist and he has referred her to neurosurgery at [facility] for second opinion we will see what they have to say as well. 04-23-2022 upon evaluation today patient appears to be doing well with regard to her wound all things considered. She has been tolerating the dressing changes without complication. Fortunately I do not see any signs of active infection locally nor systemically which is great news. No fever chills noted 04-30-2022 upon evaluation today patient appears to be doing about the same in regard to her wound. The depth is really not dramatically improved as far as the 12:00 tunnel is concerned. The overall depth and the base of the wound does seem to be a little bit better it does sound like that external wound has closed as far as the opening is concerned we can have to cut this down from a half to a smaller size based on what we are seeing today. 05-07-2022 upon evaluation today patient's wound actually seems to be doing decently well. There is not a major shift here but a slight shift in the depth both straight and as well as in the [place] at 12:00. With that being said I again we will talk about a couple of millimeters but still every little bit can count when there are situations like this to be honest. 05-14-2022 upon evaluation today patient appears to be doing okay in regard to her wound. I do not see any signs of anything worsening. With that being said also do not see getting significantly better unfortunately. There does not appear to be any evidence of infection locally or systemically which is great news. No fevers, chills, nausea, vomiting, or diarrhea. 05-21-2022 upon evaluation today patient actually appears to be doing quite well in regard to her wound from the standpoint of there being no infection. With that being said there does not appear to be any evidence of infection locally nor  systemically which is great news. No fevers, chills, nausea, vomiting, or diarrhea. 05-28-2022 upon evaluation today patient's wound  actually appears to be doing about the same. I do not see any evidence of active infection locally or systemically which is great news. No fevers, chills, nausea, vomiting, or diarrhea. 06-04-2022 upon evaluation today patient appears to be doing well with regard to her wound. She has been tolerating dressing changes without complication. Fortunately I do not feel like there is any worsening also I do not feel like there is any improvement. I feel like right a solid area here where she has a tunnel that tracks up at 12:00 and then has a fluid pocket at around roughly 1:00 I can push on this and squeeze fluid out. Again I am not exactly sure what else can be done from my standpoint to improve this. She does have an appointment with Dr. Franky Macho who I do believe is an excellent surgeon and this is the surgeon who performed this surgery initially. Again the biggest issue was following she had a lot of trouble with the wound VAC I do think that we need to try to see if she is can have a wound VAC following surgery what exactly regular need to do to help keep this moving in the right direction for her. Obviously I want her to have the best result possible that she has to go through surgery again to clean this area out. Absolutely willing to help out with getting this to heal. 06-13-2022 upon evaluation today patient appears to be doing well with regard to her wound its not any worse unfortunately it is also not significantly better which is the only issue currently. I do not see any signs of active infection locally or systemically which is great news. No fevers, chills, nausea, vomiting, or diarrhea. She does have her appointment on Monday with her neurosurgeon and we will see you Dr. Franky Macho has to say at that point. 06-18-2022 upon evaluation patient appears to be doing a  little worse in regard to her wound only in the respect that has been several days since she has had this changed in fact it was last changed on Friday. Since that time she tells me that she has kept this in place over the weekend and yesterday she was supposed to see her neurosurgeon yesterday but they had to cancel due to an emergency surgery according to what the patient tells me. Nonetheless she is having some issues here with drainage coming from the wound that is more purulent in nature I think this is something we have been seeing in the [facility] for several weeks but is more obvious being that it was not changed as much during the last week. She unfortunately is a little worried about this but again I think that this is just part of what we have been dealing with all long is more prevalent and obvious due to the length of time since it was changed last. She does have a repeat appointment for I believe July 10 she told me although she is going to see if she can find anything sooner. 06-25-2022 upon evaluation today patient's wound actually showed signs of marginal improvement which is good news. She still has the area of abscess that seems to be at roughly 2:00 when looking at her back. We can get into this with the skinny probe other than that I really do not know what else I can do to try to make this better. We discussed this and she does have an appointment with her neurosurgeon although that is not till  mid July as he had to change it at the last moment during the time she was post to see him a week ago. She does have evidence of potential infection on culture I am going to go ahead and treat this in order to ensure that nothing worsens. With that being said I do believe that overall she seems to be doing well but I do think we want to make sure that that the knee gets worse in the meantime until she gets to see her neurosurgeon. 07-01-2022 upon evaluation today patient appears to  continue to have trouble with her wound draining. Again we have been continue to monitor this and I still see the same issue that I have noted before the patient has an area which is draining seemingly from around the 2:00 location when you go up towards 12:00 and then over towards 2:00 looking at her back this is where there is actually a soft area external that if you push on it you will see fluid come out and subsequently this is also where it tracks to internally. Everything in the 6:00 location has completely healed in and this looks much better in that regard but this upper portion we just cannot get to closed with the methods that we have. It seemed to me that this is probably going to require that this area be open and cleaned out in order to allow it to heal and my suggestion would be that a wound VAC is probably going to be the ideal thing. 07-09-2022 upon evaluation today patient appears to be doing somewhat poorly in regard to her wound. She did see Dr. Franky Macho. He stated that the wound was not being "packed appropriately" according to the patient and her daughter who were present during the evaluation today as well as the evaluation with him yesterday. With that being said he packed the wound the way he said it needed to be and to be partly honest that is over packed. The patient is having a tremendous amount of discomfort and states that this is no way that she is good to be able to tolerate that. I understand the concern about the whole closing down which is why I recommended trying to keep this open is much as possible and again with that all being said this is still going to continue to be an issue as far as this closing down before everything heals up on the inside. Nonetheless she continues to have issues with significant drainage. She also has an area which almost feels like a small abscess or least of fluid pocket that feels up that was not appropriately packed with the dressings  that were in place. With that being said I think that this is going to have to be opened at some point in time in order to get this to heal I am really not certain the best way to go about this. I discussed that with the patient again today. [name], [name] ([id number]) 07-16-2022 upon evaluation today patient appears to be doing well currently in regard to her wound from the standpoint of the pain getting better which is great news. Fortunately I do not see any evidence of active infection locally or systemically which is great news. No fevers, chills, nausea, vomiting, or diarrhea. With that being said the patient has been tolerating the dressing changes better without complication which is great news. 07-29-2022 upon evaluation today patient appears to be doing actually pretty well in regard to her wound.  Fortunately there does not appear to be any signs of infection. She still has drainage and we still are seeing an area from this pocket up at 12:00 that is leaking but fortunately this does not appear to be having any significant issues with infection right now which is great news. Fortunately her pain is also calm down since I saw her 2 weeks ago. 08-05-2022 upon evaluation today patient appears to be doing unfortunately a little worse in regard to the discomfort she is feeling she tells me that this is bothering her pretty much all the time at a low level. It never seems to go away. This is since last week. With that being said the overall depth is unfortunately a little bit deeper this week which is definitely not what I was hoping to see. I did get to review the second opinion that was requested by the patient from her insurance to have an independent physician review the situation here. The short story of that reviewed which I did read through the entirety of she states that the patient does likely need surgery to correct the seroma considering length of time this has been going on. It was  recommended that a plastic surgeon be the one to have this up in case there is a muscle flap necessary but at minimum that this area needs to be cleaned out and then appropriately close following. The patient has also read through this. She does seem to be a little bit off of her normal game not feeling quite that well today. She is not exactly sure what is going on. 08-12-2022 upon evaluation today patient's wound is actually showing signs of maintaining stability which is good news. Fortunately there does not appear to be any evidence of active infection locally or systemically at this time which is excellent. With that being said she does have a meeting with her nurse with [place] [facility] on Thursday to discuss options for plastic surgery where she is able to go. 08-19-2022 upon evaluation today patient appears to be doing well currently in regard to her wound all things considered. Unfortunately the patient is still continuing to have some issues here with an ongoing open wound in the back despite everything that we have tried she has had an independent review which recommended surgical intervention. Where the process of trying to find someone to take her on as a patient. The suggestion was for plastic surgery which I think is probably a good option here. With that being said she has been looking through her insurance to see who was in network for her and has found someone that I think will be very good. Working to see about making that referral to them. 08-26-2022 upon evaluation today patient appears to be doing well currently in regard to her wound. She tells me she is having some discomfort fortunately there does not appear to be any evidence of active infection at this time which is great news. We have actually gotten her an appointment finally with the plastic surgeon that is 4 I believe it is September 26. 09-03-2022 upon evaluation today patient appears to be doing well currently in regard to  her wound everything appears to be stable which is good in that regard. With that being said the drainage does appear to be a little more thick compared to normal she has been having a lot of irritation as well. I do believe that it may be at the point that the repeating the Augmentin that  she is previously done well with in the past could be of benefit for her here currently. The patient voiced understanding she has not had this since September 27 the last time I prescribed this for her. Nonetheless it did do a very good job to calm things down quite a bit back at that time. 09-09-2022 upon evaluation today patient appears to be doing well currently in regard to her wound she is stable. There does not appear to be anything worsening at this point. Fortunately I see no signs of active infection at this time. No fevers, chills, nausea, vomiting, or diarrhea. 09-16-2022 upon evaluation today patient appears to be doing well currently regard to her wound there does not appear to be any signs of active infection locally or systemically at this time which is great news. No fevers, chills, nausea, vomiting, or diarrhea. 09-23-2022 upon evaluation today patient appears to be doing well currently in regard to her wound all things considered this is still stable although not significantly better. There is still some purulent drainage noted again she has been on antibiotics this continues to be of concern. Fortunately there does not appear to be any evidence of active infection locally or systemically at this time which is great news. Objective Constitutional Well-nourished and well-hydrated in no acute distress. Vitals Time Taken: 1:55 PM, Height: 58 in, Weight: 275 lbs, BMI: 57.5, Temperature: 98.4 F, Pulse: 90 bpm, Respiratory Rate: 18 breaths/min, Blood Pressure: 144/74 mmHg. Respiratory normal breathing without difficulty. Psychiatric this patient is able to make decisions and demonstrates good insight  into disease process. Alert and Oriented x 3. pleasant and cooperative. General Notes: Upon inspection patient's wound bed actually showed signs of good granulation and epithelization at this point. Fortunately I do not see any evidence of active infection currently which is great news and overall I am extremely pleased with where we stand at this time. Integumentary (Hair, Skin) Wound #1 status is Open. Original cause of wound was Surgical Injury. The date acquired was: 04/11/2021. The wound has been in treatment 49 Kluck, Esbeydi C. ([id number]) weeks. The wound is located on the Distal,Midline Back. The wound measures 0.3cm length x 0.3cm width x 2.5cm depth; 0.071cm^2 area and 0.177cm^3 volume. There is Fat Layer (Subcutaneous Tissue) exposed. There is no tunneling noted, however, there is undermining starting at 9:00 and ending at 3:00 with a maximum distance of 3.5cm. There is a medium amount of serosanguineous drainage noted. There is large (67- 100%) red granulation within the wound bed. There is no necrotic tissue within the wound bed. 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 Nurse Visit as needed Home Health: Home Health Company: - [facility] for wound care. May utilize formulary equivalent dressing for wound treatment orders unless otherwise specified. Home Health Nurse may visit PRN to address patient s wound care needs. - Monday and Friday [facility] fax [phone] Bathing/ Shower/ Hygiene: May shower; gently cleanse wound with antibacterial soap, rinse and pat dry prior to dressing wounds No tub bath. Anesthetic (Use 'Patient Medications' Section for Anesthetic Order Entry): Lidocaine applied to wound  bed Edema Control - Lymphedema / Segmental Compressive Device / Other: Elevate, Exercise Daily and Avoid Standing for Long Periods of Time. Elevate legs to the level of the heart and pump ankles as often as possible Elevate  leg(s) parallel to the floor when sitting. Additional Orders / Instructions: Follow Nutritious Diet and Increase Protein Intake WOUND #1: - Back Wound Laterality: Midline, Distal Cleanser: Normal Saline 3 x Per Week/30 Days Discharge Instructions: Wash your hands with soap and water. Remove old dressing, discard into plastic bag and place into trash. Cleanse the wound with Normal Saline prior to applying a clean dressing using gauze sponges, not tissues or cotton balls. Do not scrub or use excessive force. Pat dry using gauze sponges, not tissue or cotton balls. Primary Dressing: Hydrofera Blue Rope 3 x Per Week/30 Days Discharge Instructions: cut into fourths; angle the end Secondary Dressing: (BORDER) Zetuvit Plus SILICONE BORDER Dressing 4x4 (in/in) 3 x Per Week/30 Days 1. I am going to suggest that we go ahead and continue with wound care measures as before and the patient is in agreement with plan this includes the use of the Hydrofera Blue rope which I still think is the best right now. She does actually have the appointment with her plastic surgeon tomorrow to see if there is anything they can do to help get this closed. 2. Also can recommend that we have the patient continue with the bordered foam dressing to cover which I think is doing quite well. We will see patient back for reevaluation in 1 week here in the clinic. If anything worsens or changes patient will contact our office for additional recommendations. Electronic Signature(s) Signed: 09/23/2022 2:21:12 PM By: [name] PA-C Entered By: [name] on 09/23/2022 14:21:12 Nishikawa, Arnetha Salena Saner ([id number]) -------------------------------------------------------------------------------- SuperBill  Details Patient Name: Wisner, Jennylee C. Date of Service: 09/23/2022 Medical Record Number: [id number] Patient Account Number: [id number] Date of Birth/Sex: [birth year] (61 y.o. 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 G35 Multiple sclerosis I10 Essential (primary) hypertension Facility Procedures CPT4 Code: [id number] Description: [phone] - WOUND CARE VISIT-LEV 2 EST PT Modifier: Quantity: 1 Physician Procedures CPT4 Code: [id number] Description: 99213 - WC PHYS LEVEL 3 - EST PT Modifier: Quantity: 1 CPT4 Code: Description: ICD-10 Diagnosis Description T81.31XA Disruption of external operation (surgical) wound, not elsewhere classifi 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

## 2022-09-23 NOTE — Progress Notes (Signed)
[name], [name] ([id number]) Visit Report for 09/23/2022 Arrival Information Details Patient Name: [name] Date of Service: 09/23/2022 2:00 PM Medical Record Number: [id number] Patient Account Number: [id number] Date of Birth/Sex: 1961/11/01 (61 y.o. F) Treating RN: Carlene Coria Primary Care Dejaun Vidrio: Card, Jenny Reichmann Other Clinician: Referring Jammal Sarr: Card, John Treating Cavan Bearden/Extender: Skipper Cliche in Treatment: 44 Visit Information History Since Last Visit All ordered tests and consults were completed: No Patient Arrived: [name] Added or deleted any medications: No Arrival Time: 13:54 Any new allergies or adverse reactions: No Accompanied By: self Had a fall or experienced change in No Transfer Assistance: None activities of daily living that may affect Patient Identification Verified: Yes risk of falls: Secondary Verification Process Completed: Yes Signs or symptoms of abuse/neglect since last visito No Patient Requires Transmission-Based Precautions: No Hospitalized since last visit: No Patient Has Alerts: No Implantable device outside of the clinic excluding No cellular tissue based products placed in the center since last visit: Has Dressing in Place as Prescribed: Yes Pain Present Now: No Electronic Signature(s) Signed: 09/23/2022 4:20:15 PM By: Carlene Coria RN Entered By: Carlene Coria on 09/23/2022 13:55:06 Marohl, Loreli C. ([id number]) -------------------------------------------------------------------------------- Clinic Level of Care Assessment Details Patient Name: [name], [name] Date of Service: 09/23/2022 2:00 PM Medical Record Number: [id number] Patient Account Number: [id number] Date of Birth/Sex: 08-31-1961 (61 y.o. F) Treating RN: Carlene Coria Primary Care Shiana Rappleye: Card, Jenny Reichmann Other Clinician: Referring Kwynn Schlotter: Card, John Treating Jammi Morrissette/Extender: Skipper Cliche in Treatment: 48 Clinic Level of Care Assessment Items TOOL 4  Quantity Score X - Use when only an EandM is performed on FOLLOW-UP visit 1 0 ASSESSMENTS - Nursing Assessment / Reassessment X - Reassessment of Co-morbidities (includes updates in patient status) 1 10 X- 1 5 Reassessment of Adherence to Treatment Plan ASSESSMENTS - Wound and Skin Assessment / Reassessment X - Simple Wound Assessment / Reassessment - one wound 1 5 []  - 0 Complex Wound Assessment / Reassessment - multiple wounds []  - 0 Dermatologic / Skin Assessment (not related to wound area) ASSESSMENTS - Focused Assessment []  - Circumferential Edema Measurements - multi extremities 0 []  - 0 Nutritional Assessment / Counseling / Intervention []  - 0 Lower Extremity Assessment (monofilament, tuning fork, pulses) []  - 0 Peripheral Arterial Disease Assessment (using hand held doppler) ASSESSMENTS - Ostomy and/or Continence Assessment and Care []  - Incontinence Assessment and Management 0 []  - 0 Ostomy Care Assessment and Management (repouching, etc.) PROCESS - Coordination of Care X - Simple Patient / Family Education for ongoing care 1 15 []  - 0 Complex (extensive) Patient / Family Education for ongoing care []  - 0 Staff obtains Programmer, systems, Records, Test Results / Process Orders []  - 0 Staff telephones HHA, Nursing Homes / Clarify orders / etc []  - 0 Routine Transfer to another Facility (non-emergent condition) []  - 0 Routine Hospital Admission (non-emergent condition) []  - 0 New Admissions / Biomedical engineer / Ordering NPWT, Apligraf, etc. []  - 0 Emergency Hospital Admission (emergent condition) X- 1 10 Simple Discharge Coordination []  - 0 Complex (extensive) Discharge Coordination PROCESS - Special Needs []  - Pediatric / Minor Patient Management 0 []  - 0 Isolation Patient Management []  - 0 Hearing / Language / Visual special needs []  - 0 Assessment of Community assistance (transportation, D/C planning, etc.) []  - 0 Additional assistance / Altered  mentation []  - 0 Support Surface(s) Assessment (bed, cushion, seat, etc.) INTERVENTIONS - Wound Cleansing / Measurement Masur, Johnanna C. ([id number]) X- 1 5 Simple Wound Cleansing -  one wound []  - 0 Complex Wound Cleansing - multiple wounds X- 1 5 Wound Imaging (photographs - any number of wounds) []  - 0 Wound Tracing (instead of photographs) X- 1 5 Simple Wound Measurement - one wound []  - 0 Complex Wound Measurement - multiple wounds INTERVENTIONS - Wound Dressings X - Small Wound Dressing one or multiple wounds 1 10 []  - 0 Medium Wound Dressing one or multiple wounds []  - 0 Large Wound Dressing one or multiple wounds []  - 0 Application of Medications - topical []  - 0 Application of Medications - injection INTERVENTIONS - Miscellaneous []  - External ear exam 0 []  - 0 Specimen Collection (cultures, biopsies, blood, body fluids, etc.) []  - 0 Specimen(s) / Culture(s) sent or taken to Lab for analysis []  - 0 Patient Transfer (multiple staff / / Similar devices) []  - 0 Simple Staple / Suture removal (25 or less) []  - 0 Complex Staple / Suture removal (26 or more) []  - 0 Hypo / Hyperglycemic Management (close monitor of Blood Glucose) []  - 0 Ankle / Brachial Index (ABI) - do not check if billed separately X- 1 5 Vital Signs Has the patient been seen at the hospital within the last three years: Yes Total Score: 75 Level Of Care: New/Established - Level 2 Electronic Signature(s) Signed: 09/23/2022 4:20:15 PM By: RN Entered By: on 09/23/2022 14:17:49 Helbing, Teyonna C ( ) -------------------------------------------------------------------------------- Encounter Discharge Information Details Patient Name: [name], [name] Date of Service: 09/23/2022 2:00 PM Medical Record Number: Patient Account Number: Date of Birth/Sex: [birth year] (61 y.o. F) Treating RN: Primary Care Mariselda Badalamenti:  Other Clinician: Referring Nazia Rhines: Card, John Treating Edina Winningham/Extender: in Treatment: 57 Encounter Discharge Information Items Discharge Condition: Stable Ambulatory Status: Ambulatory Discharge Destination: Home Transportation: Private Auto Accompanied By: self Schedule Follow-up Appointment: Yes Clinical Summary of Care: Electronic Signature(s) Signed: 09/23/2022 4:20:15 PM By: Yevonne Pax RN Entered By: Yevonne Pax on 09/23/2022 14:19:04 Reamer, Naiyana C. (Marland Kitchen) -------------------------------------------------------------------------------- Lower Extremity Assessment Details Patient Name: Andujar, Abbi C. Date of Service: 09/23/2022 2:00 PM Medical Record Number: 09/25/2022 Patient Account Number: [id number] Date of Birth/Sex: 10-06-1961 (61 y.o. F) Treating RN: 64 Primary Care Klay Sobotka: Yevonne Pax Other Clinician: Referring Zakyla Tonche: Card, John Treating Verniece Encarnacion/Extender: [name] in Treatment: 73 Electronic Signature(s) Signed: 09/23/2022 4:20:15 PM By: 09/25/2022 RN Entered By: Yevonne Pax on 09/23/2022 13:57:32 Rendell, Shatia C09/27/2023 ([id number]) -------------------------------------------------------------------------------- Multi Wound Chart Details Patient Name: Lyles, Namira C. Date of Service: 09/23/2022 2:00 PM Medical Record Number: [id number] Patient Account Number: [id number] Date of Birth/Sex: 03/31/1961 (61 y.o. F) Treating RN: Yevonne Pax Primary Care Thierno Hun: [name] Other Clinician: Referring Kambryn Dapolito: Card, John Treating Amillion Scobee/Extender: Rowan Blase in Treatment: 17 Vital Signs Height(in): 58 Pulse(bpm): 90 Weight(lbs): 275 Blood Pressure(mmHg): 144/74 Body Mass Index(BMI): 57.5 Temperature(F): 98.4 Respiratory Rate(breaths/min): 18 Photos: [N/A:N/A] Wound Location: Distal, Midline Back N/A N/A Wounding Event: Surgical Injury N/A N/A Primary Etiology: Dehisced Wound N/A  N/A Comorbid History: Hypertension N/A N/A Date Acquired: 04/11/2021 N/A N/A Weeks of Treatment: 49 N/A N/A Wound Status: Open N/A N/A Wound Recurrence: No N/A N/A Measurements L x W x D (cm) 0.3x0.3x2.5 N/A N/A Area (cm) : 0.071 N/A N/A Volume (cm) : 0.177 N/A N/A % Reduction in Area: 43.70% N/A N/A % Reduction in Volume: 69.40% N/A N/A Starting Position 1 (o'clock): 9 Ending Position 1 (o'clock): 3 Maximum Distance 1 (cm): 3.5 Undermining: Yes N/A N/A Classification:  Full Thickness Without Exposed N/A N/A Support Structures Exudate Amount: Medium N/A N/A Exudate Type: Serosanguineous N/A N/A Exudate Color: red, brown N/A N/A Granulation Amount: Large (67-100%) N/A N/A Granulation Quality: Red N/A N/A Necrotic Amount: None Present (0%) N/A N/A Exposed Structures: Fat Layer (Subcutaneous Tissue): N/A N/A Yes Fascia: No Tendon: No Muscle: No Joint: No Bone: No Epithelialization: None N/A N/A Treatment Notes Electronic Signature(s) Signed: 09/23/2022 4:20:15 PM By: Yevonne Pax RN Entered By: Yevonne Pax on 09/23/2022 13:57:44 Alderman, Domingo Pulse ([id number]) Rehmann, [name] Salena Saner ([id number]) -------------------------------------------------------------------------------- Multi-Disciplinary Care Plan Details Patient Name: Cornwall, Chapel C. Date of Service: 09/23/2022 2:00 PM Medical Record Number: [id number] Patient Account Number: [id number] Date of Birth/Sex: 10/12/1961 (61 y.o. F) Treating RN: Yevonne Pax Primary Care Eion Timbrook: [name] Other Clinician: Referring Nikolaus Pienta: Card, John Treating Reyan Helle/Extender: Rowan Blase in Treatment: 44 Active Inactive Wound/Skin Impairment Nursing Diagnoses: Knowledge deficit related to ulceration/compromised skin integrity Goals: Patient/caregiver will verbalize understanding of skin care regimen Date Initiated: 10/15/2021 Target Resolution Date: 11/16/2022 Goal Status: Active Ulcer/skin breakdown will have a  volume reduction of 30% by week 4 Date Initiated: 10/15/2021 Date Inactivated: 01/29/2022 Target Resolution Date: 12/15/2021 Goal Status: Unmet Unmet Reason: comorbities Ulcer/skin breakdown will have a volume reduction of 50% by week 8 Date Initiated: 10/15/2021 Date Inactivated: 01/29/2022 Target Resolution Date: 01/15/2022 Goal Status: Unmet Unmet Reason: comorbities Ulcer/skin breakdown will have a volume reduction of 80% by week 12 Date Initiated: 10/15/2021 Date Inactivated: 02/19/2022 Target Resolution Date: 02/15/2022 Goal Status: Unmet Unmet Reason: comorbities Ulcer/skin breakdown will heal within 14 weeks Date Initiated: 10/15/2021 Date Inactivated: 05/07/2022 Target Resolution Date: 03/15/2022 Goal Status: Unmet Unmet Reason: comorbities Interventions: Assess patient/caregiver ability to obtain necessary supplies Assess patient/caregiver ability to perform ulcer/skin care regimen upon admission and as needed Assess ulceration(s) every visit Notes: Electronic Signature(s) Signed: 09/23/2022 4:20:15 PM By: Yevonne Pax RN Entered By: Yevonne Pax on 09/23/2022 13:57:38 Goytia, Emira C. ([id number]) -------------------------------------------------------------------------------- Pain Assessment Details Patient Name: Ange, Stormey C. Date of Service: 09/23/2022 2:00 PM Medical Record Number: [id number] Patient Account Number: [id number] Date of Birth/Sex: Nov 05, 1961 (61 y.o. F) Treating RN: Yevonne Pax Primary Care Toa Mia: [name] Other Clinician: Referring Nalia Honeycutt: Card, John Treating Brendi Mccarroll/Extender: Rowan Blase in Treatment: 67 Active Problems Location of Pain Severity and Description of Pain Patient Has Paino Yes Site Locations With Dressing Change: Yes Duration of the Pain. Constant / Intermittento Intermittent How Long Does it Lasto Hours: 1 Minutes: Rate the pain. Current Pain Level: 3 Worst Pain Level: 4 Least Pain Level: 0 Tolerable  Pain Level: 0 Character of Pain Describe the Pain: Aching, Burning Pain Management and Medication Current Pain Management: Medication: Yes Cold Application: No Rest: Yes Massage: No Activity: No T.E.N.S.: No Heat Application: No Leg drop or elevation: No Is the Current Pain Management Adequate: Inadequate How does your wound impact your activities of daily livingo Sleep: Yes Bathing: No Appetite: No Relationship With Others: No Bladder Continence: No Emotions: No Bowel Continence: No Work: No Toileting: No Drive: No Dressing: No Hobbies: No Electronic Signature(s) Signed: 09/23/2022 4:20:15 PM By: Yevonne Pax RN Entered By: Yevonne Pax on 09/23/2022 13:56:08 Nedd, Carloyn Salena Saner ([id number]) -------------------------------------------------------------------------------- Patient/Caregiver Education Details Patient Name: Lindamood, Yarelin C. Date of Service: 09/23/2022 2:00 PM Medical Record Number: [id number] Patient Account Number: [id number] Date of Birth/Gender: 1961/12/04 (61 y.o. F) Treating RN: Yevonne Pax Primary Care Physician: [name] Other Clinician: Referring Physician: Card, John Treating Physician/Extender: Rowan Blase in Treatment: 52 Education Assessment Education  Provided To: Patient Education Topics Provided Wound/Skin Impairment: Methods: Explain/Verbal Responses: State content correctly Electronic Signature(s) Signed: 09/23/2022 4:20:15 PM By: Yevonne Pax RN Entered By: Yevonne Pax on 09/23/2022 14:18:14 Millan, Ginni C. ([id number]) -------------------------------------------------------------------------------- Wound Assessment Details Patient Name: Bruins, July C. Date of Service: 09/23/2022 2:00 PM Medical Record Number: [id number] Patient Account Number: [id number] Date of Birth/Sex: 09-18-1961 (61 y.o. F) Treating RN: Yevonne Pax Primary Care Samaya Boardley: Card, Jonny Ruiz Other Clinician: Referring Keyaria Lawson: Card, John Treating  Martinez Boxx/Extender: Rowan Blase in Treatment: 49 Wound Status Wound Number: 1 Primary Etiology: Dehisced Wound Wound Location: Distal, Midline Back Wound Status: Open Wounding Event: Surgical Injury Comorbid History: Hypertension Date Acquired: 04/11/2021 Weeks Of Treatment: 49 Clustered Wound: No Photos Wound Measurements Length: (cm) 0.3 Width: (cm) 0.3 Depth: (cm) 2.5 Area: (cm) 0.071 Volume: (cm) 0.177 % Reduction in Area: 43.7% % Reduction in Volume: 69.4% Epithelialization: None Tunneling: No Undermining: Yes Starting Position (o'clock): 9 Ending Position (o'clock): 3 Maximum Distance: (cm) 3.5 Wound Description Classification: Full Thickness Without Exposed Support Structu Exudate Amount: Medium Exudate Type: Serosanguineous Exudate Color: red, brown res Foul Odor After Cleansing: No Slough/Fibrino No Wound Bed Granulation Amount: Large (67-100%) Exposed Structure Granulation Quality: Red Fascia Exposed: No Necrotic Amount: None Present (0%) Fat Layer (Subcutaneous Tissue) Exposed: Yes Tendon Exposed: No Muscle Exposed: No Joint Exposed: No Bone Exposed: No Treatment Notes Wound #1 (Back) Wound Laterality: Midline, Distal Cleanser Normal Saline Feig, Shylin C. ([id number]) Discharge Instruction: Wash your hands with soap and water. Remove old dressing, discard into plastic bag and place into trash. Cleanse the wound with Normal Saline prior to applying a clean dressing using gauze sponges, not tissues or cotton balls. Do not scrub or use excessive force. Pat dry using gauze sponges, not tissue or cotton balls. Peri-Wound Care Topical Primary Dressing Hydrofera Blue Rope Discharge Instruction: cut into fourths; angle the end Secondary Dressing (BORDER) Zetuvit Plus SILICONE BORDER Dressing 4x4 (in/in) Secured With Compression Wrap Compression Stockings Add-Ons Electronic Signature(s) Signed: 09/23/2022 4:20:15 PM By: Yevonne Pax  RN Entered By: Yevonne Pax on 09/23/2022 13:57:13 Hopkins, Jalisia C. ([id number]) -------------------------------------------------------------------------------- Vitals Details Patient Name: Everman, Kelley C. Date of Service: 09/23/2022 2:00 PM Medical Record Number: [id number] Patient Account Number: [id number] Date of Birth/Sex: [birth year] (61 y.o. F) Treating RN: Yevonne Pax Primary Care Seila Liston: [name] Other Clinician: Referring Hoorain Kozakiewicz: Card, John Treating Srija Southard/Extender: Rowan Blase in Treatment: 65 Vital Signs Time Taken: 13:55 Temperature (F): 98.4 Height (in): 58 Pulse (bpm): 90 Weight (lbs): 275 Respiratory Rate (breaths/min): 18 Body Mass Index (BMI): 57.5 Blood Pressure (mmHg): 144/74 Reference Range: 80 - 120 mg / dl Electronic Signature(s) Signed: 09/23/2022 4:20:15 PM By: Yevonne Pax RN Entered By: Yevonne Pax on 09/23/2022 13:55:29

## 2022-09-30 ENCOUNTER — Encounter: Payer: 59 | Attending: Physician Assistant | Admitting: Physician Assistant

## 2022-09-30 DIAGNOSIS — L98422 Non-pressure chronic ulcer of back with fat layer exposed: Secondary | ICD-10-CM | POA: Insufficient documentation

## 2022-09-30 DIAGNOSIS — G35 Multiple sclerosis: Secondary | ICD-10-CM | POA: Insufficient documentation

## 2022-09-30 DIAGNOSIS — T8131XA Disruption of external operation (surgical) wound, not elsewhere classified, initial encounter: Secondary | ICD-10-CM | POA: Diagnosis present

## 2022-09-30 DIAGNOSIS — I10 Essential (primary) hypertension: Secondary | ICD-10-CM | POA: Diagnosis not present

## 2022-09-30 NOTE — Progress Notes (Signed)
[name], [name] ([id number]) Visit Report for 09/30/2022 Chief Complaint Document Details Patient Name: [name], [name] Date of Service: 09/30/2022 2:00 PM Medical Record Number: [id number] Patient Account Number: [id number] Date of Birth/Sex: 09-14-1961 (61 y.o. F) Treating RN: Carlene Coria Primary Care Provider: Clayborn Heron Other Clinician: Referring Provider: Card, John Treating Provider/Extender: Skipper Cliche in Treatment: 49 Information Obtained from: Patient Chief Complaint Surgical Back Ulcer Electronic Signature(s) Signed: 09/30/2022 2:11:25 PM By: Worthy Keeler PA-C Entered By: Worthy Keeler on 09/30/2022 14:11:25 [name], [name] ([id number]) -------------------------------------------------------------------------------- HPI Details Patient Name: [name], [name] Date of Service: 09/30/2022 2:00 PM Medical Record Number: [id number] Patient Account Number: [id number] Date of Birth/Sex: 1961/05/09 (61 y.o. 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 This was actually broken up into 3 separate surgical events. The initial surgical intervention actually was on November 05, 2021 almost a year ago. Subsequently the patient went back in February for a seroma of the area which unfortunately required her to have a repeat surgery to go in and clean this out. And then again this occurred in April where she went back in and again they felt like stitches were coming out and there was an additional seroma. She was placed in a wound VAC initially and then subsequently as it got smaller that  was discontinued. Again right now I will see anything that I think a wound VAC would help with. Nonetheless she definitely has a significant depth to the wound that is going require packing. I actually believe the Hydrofera Blue rope would probably do quite well with this the problem is as much as it is draining she probably needs this to be changed at least every day. She does not really have anyone that can help with that that is the complicating scenario here. With that being said the patient does have a history of multiple sclerosis, hypertension, and again this surgical wound dehiscence in regard to her lumbar spine region. She did have a repeat MRI which was actually completed 10/09/2021. This showed that there was no significant change in the subcutaneous fluid collection/track of the lower lumbar region. This is extending to the level of the fascia unfortunately. This seems to go all the way from the L2 level with a track extending all the way to the fascia at the L4-5 level. Again this is a significant wound and there is significant drainage but does not seem to communicate to the spinal region as far as spinal fluid or otherwise is concerned that is good news. Nonetheless she last saw Dr. Christella Noa who is her neurosurgeon on 10/01/2021 that was when he ordered this last MRI she supposed to see him next week as well. With that being said he did not feel like there was any significant issue there but was not sure why this was not healing that is when he ordered the MRI. They were wanting to make sure that this was packed appropriately by home health unfortunately the main issue currently is that home health is completely out of the picture as the patient has exhausted all the home health that that she gets for a year. She is now in a very difficult  predicament where she does not have anyone to help her change the dressing and to be honest that she is not able to do it herself with the location of  the wound being on the midline lumbar spine region. If she does not have anyone that can help it is probably can to be necessary for her to go to a facility for rehab and daily dressing changes as I feel like daily changes which is much drainage that she is having is going to be necessary. 10/23/2021 upon evaluation today patient appears to be doing decently well in regard to her back ulcer. This does seem to be draining a lot less than what it is been doing in the past. With that being said she still has quite a bit of drainage nonetheless. I do think that given time this should improve least I hope so. The good news is she does have home health coming out 3 days a week were doing it 2 days a week and she is paying someone we can to help. 10/30/2021 upon evaluation today patient appears to be doing okay in regard to her back ulcer this is not draining quite as bad as it was in the beginning but he still has quite a bit of drainage noted. I do believe that the patient would benefit from [place] going ahead forward with attempting a wound VAC using the Hydrofera Blue rope to pack with and then subsequently using the VAC externally to actually suction out and help this to fill- in. I think this is our ideal way to try to get things cleared at this point. As it stands I am not certain that we are really making a progress that we want to see near with doing it in the way we are which is packing with the rope. It is a good dressing but I do think it is insufficient for total healing. She just seems to have too much in the way of drainage at this point unfortunately. 11/06/2021 upon evaluation today patient unfortunately continues to have issues with her back ongoing. The good news is her MRI that was repeated showed signs of the size of this area in the lumbar spine region having decreased from 4 cm to 3.5 cm this is definitely not bad news at all. With that being said unfortunately she continues to have issues  with ongoing drainage not as severe as in the beginning but nonetheless still significant. I do think a wound VAC still would be a good way to go although her home health agency nurse apparently has some concerns about the possibility of not being able to keep a seal with this as they had struggles in the past. Nonetheless I explained to the patient that this is much different than what she had previous and that I really feel like it would do much better as far as getting the area taken care of without having any complications or issues here. I think that we should be able to maintain a seal. Nonetheless at this time I did discuss with the patient as well that she probably does need to have a wound VAC in order for [place] to get this moving in the right direction. 11/13/2021 upon evaluation patient's wound bed actually showed signs of significant drainage at this time. She did see the surgeon yesterday he did not see anything that appeared to be infected. Nonetheless he does appear that she is continuing to have areas here that just do not seem  to want to seal up there MRI findings have been negative but nonetheless she continues to have is the seroma that is filling in. I do feel like we need to try to widen the hole so we can get at least a half of the [facility] then this will be better than nothing at this point. 11/20/2021 upon evaluation today patient actually appears to be having less pain at this point which is good news and overall she we still do not have the results of the culture back yet it had to be sent out to [facility] and we do not have the result back yet. Is doing decently well in regard to her wound. Fortunately there does not appear to be any signs of active infection systemically nor locally at this time. 11/27/2021 upon evaluation today patient appears to be doing well with regard to her wound all things considered there does appear to be less drainage than there was previous.  Fortunately I do not see any evidence of worsening of the patient is stating that she is having some issues with back pain. This is somewhat new. Again this I think could be related to the fact that she is having some issues here with infection. We are still waiting to see what the result of her culture shows from susceptibility testing Enterococcus has been identified but we do not know if this is VRE or not. 12/04/2021 upon evaluation today patient appears to be doing well with regard to her wound all things considered. I did have a conversation with Erin from Dr. Sueanne Margarita office. Of note she notes that Dr. Drinda Butts really does not want to do anything surgical right now which I completely understand. With that being said I am still leery of how far we will make it getting this to heal short of any type of surgery to open this up and allow [place] to more appropriately packed the wound. Nonetheless I will absolutely give it our best shot as far as that is concerned. I discussed that with the patient today. She voiced understanding. The good news is the drainage today seems to be clear it is no longer cloudy as it was previous I am actually very pleased in that regard. SHAQUANTA, HARKLESS ([id number]) 12/11/2021 since have last seen the patient actually did have an conversation with Dr. Franky Macho with her neurosurgeon. Again this involve the discussion around whether or not to open the wound and try to apply a wound VAC following. With that being said the decision was made that that may be the best thing to do if the patient was in agreement. Nonetheless I am actually extremely encouraged with what I am seeing today much more than I would have thought. In fact I think that we may be over packing the wound which is why she is having some discomfort at this point as there is much less of the dressing able to get and this time compared to what we saw previous. That is actually really good news. In fact the 6:00  tunnel appears to have filled in is awesome news. At the 12:00 location I am actually able to pack into that area pretty effectively at this point today. In fact I was able to get less of the packing in which I will detail below. 12/18/2021 upon evaluation today patient appears to be doing well with regard to her wound on the back. Fortunately there is no signs of active infection which is great news and overall  very pleased with where things stand today. No fevers, chills, nausea, vomiting, or diarrhea. 01/01/2022 upon evaluation today patient appears to be doing decently well in regard to her wound. Fortunately there does not appear to be any signs of anything worsening which is great news. She still continues to have issues with drainage but this is not nearly as significant as what it was in the past. There is all clear drainage no signs of purulence noted. 01/08/2022 upon evaluation today patient appears to be doing well with regard to her wound. With that being said she tells me that she has been having some increased pain over the past week. It does appear based on the amount of Hydrofera Blue that we removed this is probably over pack. Again it is difficult to know exactly how old the nurses getting all the skin but nonetheless the length of Hydrofera Blue that was utilized was way too much which may be part of the reason why this felt so uncomfortable. Fortunately I think that we can cut back on that we discussed pieces smaller so hopefully that will not be over packed. 01/22/2021 upon evaluation today patient appears to be doing well With regard to there being no signs of infection at this time. The wound does still tunnel mainly up at the 12:00 location. The depth of the tunnel complete from entry point to the base is about 3.5 cm today. With that being said I do believe that overall she is making good progress this is just a very slow process for her which I know has been frustrating as  well. 01/29/2022 upon evaluation today patient appears to be doing well with regard to the wound on her lower back and the lumbar spine region. The good news is the depth that I got this week was a little bit less going up at the 12:00 location as compared to last week. I measured this to be 3.5 cm last week and it was 3.3 cm this week. Again that is a minute shift but nonetheless a good shift in the right direction. Overall I think that we are on the right track. 02/05/2022 upon evaluation today patient appears to be doing well with regard to her wound. In fact right now her measurements are somewhere around 2.9 cm which is definitely smaller and less deep than last week At the 12:00 location. Overall I am very pleased with what we are seeing. 02/19/2022 upon evaluation today patient unfortunately is noting that the wound is actually closing up externally but internally there is still some depth to this. I discussed with her today that we cannot let it close up like this is can end up being a significant issue for her if we allow for that. Subsequently we need to do what we can to try to open this up and keep it open more effectively. She voiced understanding. With that being said we are going to proceed with that procedure today in order to debride away some of the skin on externally that is trying to close and on this. 02/26/2022 upon evaluation patient appears to be doing better in my opinion after we had to open up this area last week. Again she has a significant amount of healing and overall I am extremely pleased with where things stand currently. I do not see any evidence of active infection locally nor systemically at this time which is great news and in general I think that we are on the right track for getting  this hopefully closed final and done. 03/05/2022 upon evaluation today patient unfortunately is doing a little bit worse with regard to the whole size this is starting to get much  smaller unfortunately. There does not appear to be any signs of active infection locally nor systemically which is great news. With that being said I am concerned about the fact that the patient is doing quite a bit worse with regard to trapping some fluid and almost feels like she may have a fluid pocket that not only goes in and up towards 12:00 but looks back around towards the more superficial subcutaneous tissue. I am very concerned that this is going to be something that is very hard for [place] to heal short of another surgery to open this up and try to wound VAC from the inside out. Even then there is no guarantees this is just a very difficult situation to be perfectly honest. 03/14/2022 upon evaluation today patient appears to be doing well with regard to her wound as far as the area staying open and pleased in that regard. With that being said unfortunately she is not doing as well when it comes to the irritation around it appears to be very inflamed and red this is not good that was discussed with her today as well. Subsequently I do believe that working to need to do what we can do to try and calm this down in the past she did well with the Augmentin due to Enterococcus infection I am not sure if were dealing with the same thing or not but at the same time I think that is probably to be my go to at this point. 03/19/2022 upon evaluation today patient appears to be doing really about the same in regard to her wound that the pain is better. 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 She seems to be improved dramatically with the antibiotic currently although it does not cover for MRSA I am apt to just see how this progresses over the next several days to a week. With that being said the bigger issue here is that we simply do not seem to be making excellent progress and I am concerned about the lack of progress. I discussed with Dr. Shon Baton with [facility] in [place]  and to be honest his recommendation was that the best bet would probably be to get her to a teaching center. She is actually been seen for rheumatology and a I believe psychologist/psychiatrist at [facility]. She would prefer to go that direction as opposed to [facility] or [facility]. I am definitely okay with that. 03/26/2022 upon evaluation today patient appears to be doing okay in regard to her wound. She is not showing signs of worsening and overall she tells me that her pain is not nearly as bad as it was previous. Fortunately I do not see any evidence of active infection locally or systemically which is great news. No fevers, chills, nausea, vomiting, or diarrhea. 04-02-2022 upon evaluation today patient appears to be doing well with regard to her wound. It does not appear to be showing any signs of worsening which is great news. With that being said you are still having some issues here with drainage although it is clear and mainly just tenderness with bleeding from the [facility] I do think that treating for the second issue which is MRSA is probably the right thing to do at this point she is now off of the Augmentin. 04-09-2022 upon  evaluation today patient's wound actually appears to be doing about the same. Fortunately I do not see any evidence of active infection locally or systemically at this time which is great news. No fevers, chills, nausea, vomiting, or diarrhea. 04-16-2022 upon evaluation today patient appears to be doing well with regard to her wound in the lumbar spine region. Subsequently she continues to have an area that does have a tunnel up into the 12:00 location. This is about 3.5 cm using a skinny probe curved upwards to get into this region. With that being said I really do not see any signs of overall improvement but also do not see any signs of worsening I feel like work on about a still made here to some degree. The patient did see her neurologist and he has referred her to  neurosurgery at [facility] for second [name], [name]. ([id number]) opinion we will see what they have to say as well. 04-23-2022 upon evaluation today patient appears to be doing well with regard to her wound all things considered. She has been tolerating the dressing changes without complication. Fortunately I do not see any signs of active infection locally nor systemically which is great news. No fever chills noted 04-30-2022 upon evaluation today patient appears to be doing about the same in regard to her wound. The depth is really not dramatically improved as far as the 12:00 tunnel is concerned. The overall depth and the base of the wound does seem to be a little bit better it does sound like that external wound has closed as far as the opening is concerned we can have to cut this down from a half to a smaller size based on what we are seeing today. 05-07-2022 upon evaluation today patient's wound actually seems to be doing decently well. There is not a major shift here but a slight shift in the depth both straight and as well as in the [place] at 12:00. With that being said I again we will talk about a couple of millimeters but still every little bit can count when there are situations like this to be honest. 05-14-2022 upon evaluation today patient appears to be doing okay in regard to her wound. I do not see any signs of anything worsening. With that being said also do not see getting significantly better unfortunately. There does not appear to be any evidence of infection locally or systemically which is great news. No fevers, chills, nausea, vomiting, or diarrhea. 05-21-2022 upon evaluation today patient actually appears to be doing quite well in regard to her wound from the standpoint of there being no infection. With that being said there does not appear to be any evidence of infection locally nor systemically which is great news. No fevers, chills, nausea, vomiting, or diarrhea. 05-28-2022  upon evaluation today patient's wound actually appears to be doing about the same. I do not see any evidence of active infection locally or systemically which is great news. No fevers, chills, nausea, vomiting, or diarrhea. 06-04-2022 upon evaluation today patient appears to be doing well with regard to her wound. She has been tolerating dressing changes without complication. Fortunately I do not feel like there is any worsening also I do not feel like there is any improvement. I feel like right a solid area here where she has a tunnel that tracks up at 12:00 and then has a fluid pocket at around roughly 1:00 I can push on this and squeeze fluid out. Again I am not exactly sure  what else can be done from my standpoint to improve this. She does have an appointment with Dr. Christella Noa who I do believe is an excellent surgeon and this is the surgeon who performed this surgery initially. Again the biggest issue was following she had a lot of trouble with the wound VAC I do think that we need to try to see if she is can have a wound VAC following surgery what exactly regular need to do to help keep this moving in the right direction for her. Obviously I want her to have the best result possible that she has to go through surgery again to clean this area out. Absolutely willing to help out with getting this to heal. 06-13-2022 upon evaluation today patient appears to be doing well with regard to her wound its not any worse unfortunately it is also not significantly better which is the only issue currently. I do not see any signs of active infection locally or systemically which is great news. No fevers, chills, nausea, vomiting, or diarrhea. She does have her appointment on Monday with her neurosurgeon and we will see you Dr. Christella Noa has to say at that point. 06-18-2022 upon evaluation patient appears to be doing a little worse in regard to her wound only in the respect that has been several days since she has had  this changed in fact it was last changed on Friday. Since that time she tells me that she has kept this in place over the weekend and yesterday she was supposed to see her neurosurgeon yesterday but they had to cancel due to an emergency surgery according to what the patient tells me. Nonetheless she is having some issues here with drainage coming from the wound that is more purulent in nature I think this is something we have been seeing in the [facility] for several weeks but is more obvious being that it was not changed as much during the last week. She unfortunately is a little worried about this but again I think that this is just part of what we have been dealing with all long is more prevalent and obvious due to the length of time since it was changed last. She does have a repeat appointment for I believe July 10 she told me although she is going to see if she can find anything sooner. 06-25-2022 upon evaluation today patient's wound actually showed signs of marginal improvement which is good news. She still has the area of abscess that seems to be at roughly 2:00 when looking at her back. We can get into this with the skinny probe other than that I really do not know what else I can do to try to make this better. We discussed this and she does have an appointment with her neurosurgeon although that is not till mid July as he had to change it at the last moment during the time she was post to see him a week ago. She does have evidence of potential infection on culture I am going to go ahead and treat this in order to ensure that nothing worsens. With that being said I do believe that overall she seems to be doing well but I do think we want to make sure that that the knee gets worse in the meantime until she gets to see her neurosurgeon. 07-01-2022 upon evaluation today patient appears to continue to have trouble with her wound draining. Again we have been continue to monitor this and I still  see  the same issue that I have noted before the patient has an area which is draining seemingly from around the 2:00 location when you go up towards 12:00 and then over towards 2:00 looking at her back this is where there is actually a soft area external that if you push on it you will see fluid come out and subsequently this is also where it tracks to internally. Everything in the 6:00 location has completely healed in and this looks much better in that regard but this upper portion we just cannot get to closed with the methods that we have. It seemed to me that this is probably going to require that this area be open and cleaned out in order to allow it to heal and my suggestion would be that a wound VAC is probably going to be the ideal thing. 07-09-2022 upon evaluation today patient appears to be doing somewhat poorly in regard to her wound. She did see Dr. Franky Macho. He stated that the wound was not being "packed appropriately" according to the patient and her daughter who were present during the evaluation today as well as the evaluation with him yesterday. With that being said he packed the wound the way he said it needed to be and to be partly honest that is over packed. The patient is having a tremendous amount of discomfort and states that this is no way that she is good to be able to tolerate that. I understand the concern about the whole closing down which is why I recommended trying to keep this open is much as possible and again with that all being said this is still going to continue to be an issue as far as this closing down before everything heals up on the inside. Nonetheless she continues to have issues with significant drainage. She also has an area which almost feels like a small abscess or least of fluid pocket that feels up that was not appropriately packed with the dressings that were in place. With that being said I think that this is going to have to be opened at some point in  time in order to get this to heal I am really not certain the best way to go about this. I discussed that with the patient again today. 07-16-2022 upon evaluation today patient appears to be doing well currently in regard to her wound from the standpoint of the pain getting better which is great news. Fortunately I do not see any evidence of active infection locally or systemically which is great news. No fevers, chills, nausea, vomiting, or diarrhea. With that being said the patient has been tolerating the dressing changes better without complication which is great news. [name], [name] ([id number]) 07-29-2022 upon evaluation today patient appears to be doing actually pretty well in regard to her wound. Fortunately there does not appear to be any signs of infection. She still has drainage and we still are seeing an area from this pocket up at 12:00 that is leaking but fortunately this does not appear to be having any significant issues with infection right now which is great news. Fortunately her pain is also calm down since I saw her 2 weeks ago. 08-05-2022 upon evaluation today patient appears to be doing unfortunately a little worse in regard to the discomfort she is feeling she tells me that this is bothering her pretty much all the time at a low level. It never seems to go away. This is since last week. With that being  said the overall depth is unfortunately a little bit deeper this week which is definitely not what I was hoping to see. I did get to review the second opinion that was requested by the patient from her insurance to have an independent physician review the situation here. The short story of that reviewed which I did read through the entirety of she states that the patient does likely need surgery to correct the seroma considering length of time this has been going on. It was recommended that a plastic surgeon be the one to have this up in case there is a muscle flap necessary but at  minimum that this area needs to be cleaned out and then appropriately close following. The patient has also read through this. She does seem to be a little bit off of her normal game not feeling quite that well today. She is not exactly sure what is going on. 08-12-2022 upon evaluation today patient's wound is actually showing signs of maintaining stability which is good news. Fortunately there does not appear to be any evidence of active infection locally or systemically at this time which is excellent. With that being said she does have a meeting with her nurse with [place] [facility] on Thursday to discuss options for plastic surgery where she is able to go. 08-19-2022 upon evaluation today patient appears to be doing well currently in regard to her wound all things considered. Unfortunately the patient is still continuing to have some issues here with an ongoing open wound in the back despite everything that we have tried she has had an independent review which recommended surgical intervention. Where the process of trying to find someone to take her on as a patient. The suggestion was for plastic surgery which I think is probably a good option here. With that being said she has been looking through her insurance to see who was in network for her and has found someone that I think will be very good. Working to see about making that referral to them. 08-26-2022 upon evaluation today patient appears to be doing well currently in regard to her wound. She tells me she is having some discomfort fortunately there does not appear to be any evidence of active infection at this time which is great news. We have actually gotten her an appointment finally with the plastic surgeon that is 4 I believe it is September 26. 09-03-2022 upon evaluation today patient appears to be doing well currently in regard to her wound everything appears to be stable which is good in that regard. With that being said the drainage  does appear to be a little more thick compared to normal she has been having a lot of irritation as well. I do believe that it may be at the point that the repeating the Augmentin that she is previously done well with in the past could be of benefit for her here currently. The patient voiced understanding she has not had this since September 27 the last time I prescribed this for her. Nonetheless it did do a very good job to calm things down quite a bit back at that time. 09-09-2022 upon evaluation today patient appears to be doing well currently in regard to her wound she is stable. There does not appear to be anything worsening at this point. Fortunately I see no signs of active infection at this time. No fevers, chills, nausea, vomiting, or diarrhea. 09-16-2022 upon evaluation today patient appears to be doing well currently regard to  her wound there does not appear to be any signs of active infection locally or systemically at this time which is great news. No fevers, chills, nausea, vomiting, or diarrhea. 09-23-2022 upon evaluation today patient appears to be doing well currently in regard to her wound all things considered this is still stable although not significantly better. There is still some purulent drainage noted again she has been on antibiotics this continues to be of concern. Fortunately there does not appear to be any evidence of active infection locally or systemically at this time which is great news. 09-30-2022 upon evaluation today patient appears to be doing very well in regard to her wound and things are stable. She also did see the surgeon, Dr. Ferd Hibbs at [facility] in [place]. She actually has a plan to go forward with performing surgery in order to close this wound which I think is definitely going to be the right thing to do. Fortunately I do not see any signs of active infection locally or systemically which is great news and my hope is they will be able to surgically get  this closed so that she can move on from this aspect of her life where she has been really struggling to get back to more normal situation. Electronic Signature(s) Signed: 09/30/2022 3:06:57 PM By: [name] PA-C Entered By: [name] on 09/30/2022 15:06:57 [name], [name] ([id number]) -------------------------------------------------------------------------------- Physical Exam Details Patient Name: [name], [name] Date of Service: 09/30/2022 2:00 PM Medical Record Number: [id number] Patient Account Number: [id number] Date of Birth/Sex: [birth year] (61 y.o. F) Treating RN: Yevonne Pax Primary Care Provider: [name] Other Clinician: Referring Provider: Card, John Treating Provider/Extender: Rowan Blase in Treatment: 50 Constitutional Well-nourished and well-hydrated in no acute distress. Respiratory normal breathing without difficulty. Psychiatric this patient is able to make decisions and demonstrates good insight into disease process. Alert and Oriented x 3. pleasant and cooperative. Notes Upon inspection patient's wound bed actually showed signs of good granulation and epithelization at this point. Fortunately I do not see any evidence of active infection locally or systemically which is great news and overall I am extremely pleased with where we stand today. Electronic Signature(s) Signed: 09/30/2022 3:07:12 PM By: [name] PA-C Entered By: [name] on 09/30/2022 15:07:12 Whitmire, Domingo Pulse ([id number]) -------------------------------------------------------------------------------- Physician Orders Details Patient Name: Bohlman, Ahna C. Date of Service: 09/30/2022 2:00 PM Medical Record Number: [id number] Patient Account Number: [id number] Date of Birth/Sex: [birth year] (61 y.o. 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 o Nurse Visit as needed Home Health o Home Health Company: [facility] o [facility] for wound care. May utilize formulary equivalent dressing for wound treatment orders unless otherwise specified. Home Health Nurse may visit PRN to address patientos wound care needs. - Monday and Friday [facility] fax [phone] Bathing/ Shower/ Hygiene o May shower; gently cleanse wound with antibacterial soap, rinse and pat dry prior to dressing wounds o No tub bath. Anesthetic (Use 'Patient Medications' Section for Anesthetic Order Entry) o Lidocaine applied to wound bed Edema Control - Lymphedema / Segmental Compressive Device / Other o Elevate,  Exercise Daily and Avoid Standing for Long Periods of Time. o Elevate legs to the level of the heart and pump ankles as often as possible o Elevate leg(s) parallel to the floor when sitting. Additional Orders / Instructions o Follow Nutritious Diet and Increase Protein Intake Wound Treatment Wound #1 - Back Wound Laterality: Midline, Distal Cleanser: Normal Saline 3 x Per Week/30 Days Discharge Instructions: Wash your hands with soap and water. Remove old dressing, discard into plastic bag and place into trash. Cleanse the wound with Normal Saline prior to applying a clean dressing using gauze sponges, not tissues or cotton balls. Do not scrub or use excessive force. Pat dry using gauze sponges, not tissue or cotton balls. Primary Dressing: Hydrofera Blue Rope 3 x Per Week/30 Days Discharge Instructions: cut into fourths; angle the end Secondary Dressing:  (BORDER) Zetuvit Plus SILICONE BORDER Dressing 4x4 (in/in) 3 x Per Week/30 Days Electronic Signature(s) Unsigned Entered ByYevonne Pax on 09/30/2022 14:18:56 Signature(s): Date(s): [name], [name] ([id number]) -------------------------------------------------------------------------------- Problem List Details Patient Name: [name], [name] Date of Service: 09/30/2022 2:00 PM Medical Record Number: [id number] Patient Account Number: [id number] Date of Birth/Sex: [birth year] (61 y.o. 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 ([id number]) -------------------------------------------------------------------------------- Progress Note Details Patient Name: [name], [name] Date of Service: 09/30/2022 2:00 PM Medical Record Number: [id number] Patient Account Number: [id number] Date of Birth/Sex: 1961/05/01 (61 y.o. 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 This was actually broken up into 3 separate surgical events. The initial surgical intervention actually was on November 05, 2021 almost a year ago. Subsequently the patient went back in February for a seroma of the area which unfortunately required her to have a repeat surgery to go in and clean this out. And then again this occurred in April where she went back in and again they felt like stitches were coming out and there was an additional seroma. She was placed in a wound VAC initially and then subsequently as it got smaller that was discontinued. Again right now I will see anything that I think a wound VAC would help with. Nonetheless she definitely has a significant depth to the wound that is going require packing. I actually believe the Hydrofera Blue rope would probably do quite well with this the problem is as much as it is draining she probably needs this to be changed at least every day. She does not really have anyone that can help with that that is the complicating scenario here. With that being said the patient does have a history of multiple sclerosis, hypertension, and again this  surgical wound dehiscence in regard to her lumbar spine region. She did have a repeat MRI which was actually completed 10/09/2021. This showed that there was no significant change in the subcutaneous fluid collection/track of the lower lumbar region. This is extending to the level of the fascia unfortunately. This seems to go all the way from the L2 level with a track extending all the way to the fascia at the L4-5 level. Again this is a significant wound and there is significant drainage but does not seem to communicate to the spinal region as far as spinal fluid or otherwise is concerned that is good news. Nonetheless she last saw Dr. Christella Noa who is her neurosurgeon on 10/01/2021 that was  when he ordered this last MRI she supposed to see him next week as well. With that being said he did not feel like there was any significant issue there but was not sure why this was not healing that is when he ordered the MRI. They were wanting to make sure that this was packed appropriately by home health unfortunately the main issue currently is that home health is completely out of the picture as the patient has exhausted all the home health that that she gets for a year. She is now in a very difficult predicament where she does not have anyone to help her change the dressing and to be honest that she is not able to do it herself with the location of the wound being on the midline lumbar spine region. If she does not have anyone that can help it is probably can to be necessary for her to go to a facility for rehab and daily dressing changes as I feel like daily changes which is much drainage that she is having is going to be necessary. 10/23/2021 upon evaluation today patient appears to be doing decently well in regard to her back ulcer. This does seem to be draining a lot less than what it is been doing in the past. With that being said she still has quite a bit of drainage nonetheless. I do think that given time this should improve least I hope so. The good news is she does have home health coming out 3 days a week were doing it 2 days a week and she is paying someone we can to help. 10/30/2021 upon evaluation today patient appears to be doing okay in regard to her back ulcer this is not draining quite as bad as it was in the beginning but he still has quite a bit of drainage noted. I do believe that the patient would benefit from [place] going ahead forward with attempting a wound VAC using the Hydrofera Blue rope to pack with and then subsequently using the VAC externally to actually suction out and help this to fill- in. I think this is our ideal way to try to get things cleared at this point. As it  stands I am not certain that we are really making a progress that we want to see near with doing it in the way we are which is packing with the rope. It is a good dressing but I do think it is insufficient for total healing. She just seems to have too much in the way of drainage at this point unfortunately. 11/06/2021 upon evaluation today patient unfortunately continues to have issues with her back ongoing. The good news is her MRI that was repeated showed signs of the size of this area in the lumbar spine region  having decreased from 4 cm to 3.5 cm this is definitely not bad news at all. With that being said unfortunately she continues to have issues with ongoing drainage not as severe as in the beginning but nonetheless still significant. I do think a wound VAC still would be a good way to go although her home health agency nurse apparently has some concerns about the possibility of not being able to keep a seal with this as they had struggles in the past. Nonetheless I explained to the patient that this is much different than what she had previous and that I really feel like it would do much better as far as getting the area taken care of without having any complications or issues here. I think that we should be able to maintain a seal. Nonetheless at this time I did discuss with the patient as well that she probably does need to have a wound VAC in order for [place] to get this moving in the right direction. 11/13/2021 upon evaluation patient's wound bed actually showed signs of significant drainage at this time. She did see the surgeon yesterday he did not see anything that appeared to be infected. Nonetheless he does appear that she is continuing to have areas here that just do not seem to want to seal up there MRI findings have been negative but nonetheless she continues to have is the seroma that is filling in. I do feel like we need to try to widen the hole so we can get at least a half of the  [facility] then this will be better than nothing at this point. 11/20/2021 upon evaluation today patient actually appears to be having less pain at this point which is good news and overall she we still do not have the results of the culture back yet it had to be sent out to [facility] and we do not have the result back yet. Is doing decently well in regard to her wound. Fortunately there does not appear to be any signs of active infection systemically nor locally at this time. 11/27/2021 upon evaluation today patient appears to be doing well with regard to her wound all things considered there does appear to be less drainage than there was previous. Fortunately I do not see any evidence of worsening of the patient is stating that she is having some issues with back pain. This is somewhat new. Again this I think could be related to the fact that she is having some issues here with infection. We are still waiting to see what the result of her culture shows from susceptibility testing Enterococcus has been identified but we do not know if this is VRE or not. 12/04/2021 upon evaluation today patient appears to be doing well with regard to her wound all things considered. I did have a conversation with Erin from Dr. Lacy Duverney office. Of note she notes that Dr. Joetta Manners really does not want to do anything surgical right now which I completely [name], [name] ([id number]) understand. With that being said I am still leery of how far we will make it getting this to heal short of any type of surgery to open this up and allow [place] to more appropriately packed the wound. Nonetheless I will absolutely give it our best shot as far as that is concerned. I discussed that with the patient today. She voiced understanding. The good news is the drainage today seems to be clear it is no longer cloudy as it was previous  I am actually very pleased in that regard. 12/11/2021 since have last seen the patient actually did  have an conversation with Dr. Christella Noa with her neurosurgeon. Again this involve the discussion around whether or not to open the wound and try to apply a wound VAC following. With that being said the decision was made that that may be the best thing to do if the patient was in agreement. Nonetheless I am actually extremely encouraged with what I am seeing today much more than I would have thought. In fact I think that we may be over packing the wound which is why she is having some discomfort at this point as there is much less of the dressing able to get and this time compared to what we saw previous. That is actually really good news. In fact the 6:00 tunnel appears to have filled in is awesome news. At the 12:00 location I am actually able to pack into that area pretty effectively at this point today. In fact I was able to get less of the packing in which I will detail below. 12/18/2021 upon evaluation today patient appears to be doing well with regard to her wound on the back. Fortunately there is no signs of active infection which is great news and overall very pleased with where things stand today. No fevers, chills, nausea, vomiting, or diarrhea. 01/01/2022 upon evaluation today patient appears to be doing decently well in regard to her wound. Fortunately there does not appear to be any signs of anything worsening which is great news. She still continues to have issues with drainage but this is not nearly as significant as what it was in the past. There is all clear drainage no signs of purulence noted. 01/08/2022 upon evaluation today patient appears to be doing well with regard to her wound. With that being said she tells me that she has been having some increased pain over the past week. It does appear based on the amount of Hydrofera Blue that we removed this is probably over pack. Again it is difficult to know exactly how old the nurses getting all the skin but nonetheless the length of  Hydrofera Blue that was utilized was way too much which may be part of the reason why this felt so uncomfortable. Fortunately I think that we can cut back on that we discussed pieces smaller so hopefully that will not be over packed. 01/22/2021 upon evaluation today patient appears to be doing well With regard to there being no signs of infection at this time. The wound does still tunnel mainly up at the 12:00 location. The depth of the tunnel complete from entry point to the base is about 3.5 cm today. With that being said I do believe that overall she is making good progress this is just a very slow process for her which I know has been frustrating as well. 01/29/2022 upon evaluation today patient appears to be doing well with regard to the wound on her lower back and the lumbar spine region. The good news is the depth that I got this week was a little bit less going up at the 12:00 location as compared to last week. I measured this to be 3.5 cm last week and it was 3.3 cm this week. Again that is a minute shift but nonetheless a good shift in the right direction. Overall I think that we are on the right track. 02/05/2022 upon evaluation today patient appears to be doing well with regard to  her wound. In fact right now her measurements are somewhere around 2.9 cm which is definitely smaller and less deep than last week At the 12:00 location. Overall I am very pleased with what we are seeing. 02/19/2022 upon evaluation today patient unfortunately is noting that the wound is actually closing up externally but internally there is still some depth to this. I discussed with her today that we cannot let it close up like this is can end up being a significant issue for her if we allow for that. Subsequently we need to do what we can to try to open this up and keep it open more effectively. She voiced understanding. With that being said we are going to proceed with that procedure today in order to debride away  some of the skin on externally that is trying to close and on this. 02/26/2022 upon evaluation patient appears to be doing better in my opinion after we had to open up this area last week. Again she has a significant amount of healing and overall I am extremely pleased with where things stand currently. I do not see any evidence of active infection locally nor systemically at this time which is great news and in general I think that we are on the right track for getting this hopefully closed final and done. 03/05/2022 upon evaluation today patient unfortunately is doing a little bit worse with regard to the whole size this is starting to get much smaller unfortunately. There does not appear to be any signs of active infection locally nor systemically which is great news. With that being said I am concerned about the fact that the patient is doing quite a bit worse with regard to trapping some fluid and almost feels like she may have a fluid pocket that not only goes in and up towards 12:00 but looks back around towards the more superficial subcutaneous tissue. I am very concerned that this is going to be something that is very hard for [place] to heal short of another surgery to open this up and try to wound VAC from the inside out. Even then there is no guarantees this is just a very difficult situation to be perfectly honest. 03/14/2022 upon evaluation today patient appears to be doing well with regard to her wound as far as the area staying open and pleased in that regard. With that being said unfortunately she is not doing as well when it comes to the irritation around it appears to be very inflamed and red this is not good that was discussed with her today as well. Subsequently I do believe that working to need to do what we can do to try and calm this down in the past she did well with the Augmentin due to Enterococcus infection I am not sure if were dealing with the same thing or not but at the same  time I think that is probably to be my go to at this point. 03/19/2022 upon evaluation today patient appears to be doing really about the same in regard to her wound that the pain is better. 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 She seems to be improved dramatically with the antibiotic currently although it does not cover for MRSA I am apt to just see how this progresses over the next several days to a week. With that being said the bigger issue here is that we simply do not seem to be making excellent progress and I am  concerned about the lack of progress. I discussed with Dr. Rolena Infante with [facility] in [place] and to be honest his recommendation was that the best bet would probably be to get her to a teaching center. She is actually been seen for rheumatology and a I believe psychologist/psychiatrist at [facility]. She would prefer to go that direction as opposed to [facility] or [place]. I am definitely okay with that. 03/26/2022 upon evaluation today patient appears to be doing okay in regard to her wound. She is not showing signs of worsening and overall she tells me that her pain is not nearly as bad as it was previous. Fortunately I do not see any evidence of active infection locally or systemically which is great news. No fevers, chills, nausea, vomiting, or diarrhea. 04-02-2022 upon evaluation today patient appears to be doing well with regard to her wound. It does not appear to be showing any signs of worsening which is great news. With that being said you are still having some issues here with drainage although it is clear and mainly just tenderness with bleeding from the [facility] I do think that treating for the second issue which is MRSA is probably the right thing to do at this point she is now off of the Augmentin. 04-09-2022 upon evaluation today patient's wound actually appears to be doing about the same. Fortunately I do not see any evidence of active infection  locally or systemically at this time which is great news. No fevers, chills, nausea, vomiting, or diarrhea. GLENETTE, HOSAKA ([id number]) 04-16-2022 upon evaluation today patient appears to be doing well with regard to her wound in the lumbar spine region. Subsequently she continues to have an area that does have a tunnel up into the 12:00 location. This is about 3.5 cm using a skinny probe curved upwards to get into this region. With that being said I really do not see any signs of overall improvement but also do not see any signs of worsening I feel like work on about a still made here to some degree. The patient did see her neurologist and he has referred her to neurosurgery at [facility] for second opinion we will see what they have to say as well. 04-23-2022 upon evaluation today patient appears to be doing well with regard to her wound all things considered. She has been tolerating the dressing changes without complication. Fortunately I do not see any signs of active infection locally nor systemically which is great news. No fever chills noted 04-30-2022 upon evaluation today patient appears to be doing about the same in regard to her wound. The depth is really not dramatically improved as far as the 12:00 tunnel is concerned. The overall depth and the base of the wound does seem to be a little bit better it does sound like that external wound has closed as far as the opening is concerned we can have to cut this down from a half to a smaller size based on what we are seeing today. 05-07-2022 upon evaluation today patient's wound actually seems to be doing decently well. There is not a major shift here but a slight shift in the depth both straight and as well as in the [place] at 12:00. With that being said I again we will talk about a couple of millimeters but still every little bit can count when there are situations like this to be honest. 05-14-2022 upon evaluation today patient appears to be  doing okay in regard to her wound. I  do not see any signs of anything worsening. With that being said also do not see getting significantly better unfortunately. There does not appear to be any evidence of infection locally or systemically which is great news. No fevers, chills, nausea, vomiting, or diarrhea. 05-21-2022 upon evaluation today patient actually appears to be doing quite well in regard to her wound from the standpoint of there being no infection. With that being said there does not appear to be any evidence of infection locally nor systemically which is great news. No fevers, chills, nausea, vomiting, or diarrhea. 05-28-2022 upon evaluation today patient's wound actually appears to be doing about the same. I do not see any evidence of active infection locally or systemically which is great news. No fevers, chills, nausea, vomiting, or diarrhea. 06-04-2022 upon evaluation today patient appears to be doing well with regard to her wound. She has been tolerating dressing changes without complication. Fortunately I do not feel like there is any worsening also I do not feel like there is any improvement. I feel like right a solid area here where she has a tunnel that tracks up at 12:00 and then has a fluid pocket at around roughly 1:00 I can push on this and squeeze fluid out. Again I am not exactly sure what else can be done from my standpoint to improve this. She does have an appointment with Dr. Christella Noa who I do believe is an excellent surgeon and this is the surgeon who performed this surgery initially. Again the biggest issue was following she had a lot of trouble with the wound VAC I do think that we need to try to see if she is can have a wound VAC following surgery what exactly regular need to do to help keep this moving in the right direction for her. Obviously I want her to have the best result possible that she has to go through surgery again to clean this area out. Absolutely willing to  help out with getting this to heal. 06-13-2022 upon evaluation today patient appears to be doing well with regard to her wound its not any worse unfortunately it is also not significantly better which is the only issue currently. I do not see any signs of active infection locally or systemically which is great news. No fevers, chills, nausea, vomiting, or diarrhea. She does have her appointment on Monday with her neurosurgeon and we will see you Dr. Christella Noa has to say at that point. 06-18-2022 upon evaluation patient appears to be doing a little worse in regard to her wound only in the respect that has been several days since she has had this changed in fact it was last changed on Friday. Since that time she tells me that she has kept this in place over the weekend and yesterday she was supposed to see her neurosurgeon yesterday but they had to cancel due to an emergency surgery according to what the patient tells me. Nonetheless she is having some issues here with drainage coming from the wound that is more purulent in nature I think this is something we have been seeing in the [facility] for several weeks but is more obvious being that it was not changed as much during the last week. She unfortunately is a little worried about this but again I think that this is just part of what we have been dealing with all long is more prevalent and obvious due to the length of time since it was changed last. She does have  a repeat appointment for I believe July 10 she told me although she is going to see if she can find anything sooner. 06-25-2022 upon evaluation today patient's wound actually showed signs of marginal improvement which is good news. She still has the area of abscess that seems to be at roughly 2:00 when looking at her back. We can get into this with the skinny probe other than that I really do not know what else I can do to try to make this better. We discussed this and she does have an  appointment with her neurosurgeon although that is not till mid July as he had to change it at the last moment during the time she was post to see him a week ago. She does have evidence of potential infection on culture I am going to go ahead and treat this in order to ensure that nothing worsens. With that being said I do believe that overall she seems to be doing well but I do think we want to make sure that that the knee gets worse in the meantime until she gets to see her neurosurgeon. 07-01-2022 upon evaluation today patient appears to continue to have trouble with her wound draining. Again we have been continue to monitor this and I still see the same issue that I have noted before the patient has an area which is draining seemingly from around the 2:00 location when you go up towards 12:00 and then over towards 2:00 looking at her back this is where there is actually a soft area external that if you push on it you will see fluid come out and subsequently this is also where it tracks to internally. Everything in the 6:00 location has completely healed in and this looks much better in that regard but this upper portion we just cannot get to closed with the methods that we have. It seemed to me that this is probably going to require that this area be open and cleaned out in order to allow it to heal and my suggestion would be that a wound VAC is probably going to be the ideal thing. 07-09-2022 upon evaluation today patient appears to be doing somewhat poorly in regard to her wound. She did see Dr. Christella Noa. He stated that the wound was not being "packed appropriately" according to the patient and her daughter who were present during the evaluation today as well as the evaluation with him yesterday. With that being said he packed the wound the way he said it needed to be and to be partly honest that is over packed. The patient is having a tremendous amount of discomfort and states that this is no way  that she is good to be able to tolerate that. I understand the concern about the whole closing down which is why I recommended trying to keep this open is much as possible and again with that all being said this is still going to continue to be an issue as far as this closing down before everything heals up on the inside. Nonetheless she continues to have issues with significant drainage. She also has an area which almost feels like a small abscess or least of fluid pocket that feels up that was not appropriately packed with the dressings that were in place. With that being said I think that this is going to have to be opened at some point in time in order to get this to heal I am really not certain the best way to  go about this. I discussed that with the patient again today. CHENELLE, ELFERS ([id number]) 07-16-2022 upon evaluation today patient appears to be doing well currently in regard to her wound from the standpoint of the pain getting better which is great news. Fortunately I do not see any evidence of active infection locally or systemically which is great news. No fevers, chills, nausea, vomiting, or diarrhea. With that being said the patient has been tolerating the dressing changes better without complication which is great news. 07-29-2022 upon evaluation today patient appears to be doing actually pretty well in regard to her wound. Fortunately there does not appear to be any signs of infection. She still has drainage and we still are seeing an area from this pocket up at 12:00 that is leaking but fortunately this does not appear to be having any significant issues with infection right now which is great news. Fortunately her pain is also calm down since I saw her 2 weeks ago. 08-05-2022 upon evaluation today patient appears to be doing unfortunately a little worse in regard to the discomfort she is feeling she tells me that this is bothering her pretty much all the time at a low level. It  never seems to go away. This is since last week. With that being said the overall depth is unfortunately a little bit deeper this week which is definitely not what I was hoping to see. I did get to review the second opinion that was requested by the patient from her insurance to have an independent physician review the situation here. The short story of that reviewed which I did read through the entirety of she states that the patient does likely need surgery to correct the seroma considering length of time this has been going on. It was recommended that a plastic surgeon be the one to have this up in case there is a muscle flap necessary but at minimum that this area needs to be cleaned out and then appropriately close following. The patient has also read through this. She does seem to be a little bit off of her normal game not feeling quite that well today. She is not exactly sure what is going on. 08-12-2022 upon evaluation today patient's wound is actually showing signs of maintaining stability which is good news. Fortunately there does not appear to be any evidence of active infection locally or systemically at this time which is excellent. With that being said she does have a meeting with her nurse with [place] [facility] on Thursday to discuss options for plastic surgery where she is able to go. 08-19-2022 upon evaluation today patient appears to be doing well currently in regard to her wound all things considered. Unfortunately the patient is still continuing to have some issues here with an ongoing open wound in the back despite everything that we have tried she has had an independent review which recommended surgical intervention. Where the process of trying to find someone to take her on as a patient. The suggestion was for plastic surgery which I think is probably a good option here. With that being said she has been looking through her insurance to see who was in network for her and has found  someone that I think will be very good. Working to see about making that referral to them. 08-26-2022 upon evaluation today patient appears to be doing well currently in regard to her wound. She tells me she is having some discomfort fortunately there does not appear to be  any evidence of active infection at this time which is great news. We have actually gotten her an appointment finally with the plastic surgeon that is 4 I believe it is September 26. 09-03-2022 upon evaluation today patient appears to be doing well currently in regard to her wound everything appears to be stable which is good in that regard. With that being said the drainage does appear to be a little more thick compared to normal she has been having a lot of irritation as well. I do believe that it may be at the point that the repeating the Augmentin that she is previously done well with in the past could be of benefit for her here currently. The patient voiced understanding she has not had this since September 27 the last time I prescribed this for her. Nonetheless it did do a very good job to calm things down quite a bit back at that time. 09-09-2022 upon evaluation today patient appears to be doing well currently in regard to her wound she is stable. There does not appear to be anything worsening at this point. Fortunately I see no signs of active infection at this time. No fevers, chills, nausea, vomiting, or diarrhea. 09-16-2022 upon evaluation today patient appears to be doing well currently regard to her wound there does not appear to be any signs of active infection locally or systemically at this time which is great news. No fevers, chills, nausea, vomiting, or diarrhea. 09-23-2022 upon evaluation today patient appears to be doing well currently in regard to her wound all things considered this is still stable although not significantly better. There is still some purulent drainage noted again she has been on antibiotics this  continues to be of concern. Fortunately there does not appear to be any evidence of active infection locally or systemically at this time which is great news. 09-30-2022 upon evaluation today patient appears to be doing very well in regard to her wound and things are stable. She also did see the surgeon, Dr. Jaclynn Guarneri at [facility] in [place]. She actually has a plan to go forward with performing surgery in order to close this wound which I think is definitely going to be the right thing to do. Fortunately I do not see any signs of active infection locally or systemically which is great news and my hope is they will be able to surgically get this closed so that she can move on from this aspect of her life where she has been really struggling to get back to more normal situation. Objective Constitutional Well-nourished and well-hydrated in no acute distress. Vitals Time Taken: 2:09 PM, Height: 58 in, Weight: 275 lbs, BMI: 57.5, Temperature: 98.3 F, Pulse: 88 bpm, Respiratory Rate: 18 breaths/min, Blood Pressure: 152/90 mmHg. Respiratory normal breathing without difficulty. Psychiatric this patient is able to make decisions and demonstrates good insight into disease process. Alert and Oriented x 3. pleasant and cooperative. Alberson, Bruna C. ([id number]) General Notes: Upon inspection patient's wound bed actually showed signs of good granulation and epithelization at this point. Fortunately I do not see any evidence of active infection locally or systemically which is great news and overall I am extremely pleased with where we stand today. Integumentary (Hair, Skin) Wound #1 status is Open. Original cause of wound was Surgical Injury. The date acquired was: 04/11/2021. The wound has been in treatment 50 weeks. The wound is located on the [place] Back. The wound measures 0.3cm length x 0.3cm width x 2.5cm depth; 0.071cm^2 area and  0.177cm^3 volume. There is Fat Layer (Subcutaneous Tissue)  exposed. There is no tunneling noted, however, there is undermining starting at 9:00 and ending at 3:00 with a maximum distance of 3.4cm. There is a medium amount of serosanguineous drainage noted. There is large (67- 100%) red granulation within the wound bed. There is no necrotic tissue within the wound bed. 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 Nurse Visit as needed Home Health: [place]: - [place] for wound care. May utilize formulary equivalent dressing for wound treatment orders unless otherwise specified. Home Health Nurse may visit PRN to address patient s wound care needs. - Monday and Friday [facility] fax [phone] Bathing/ Shower/ Hygiene: May shower; gently cleanse wound with antibacterial soap, rinse and pat dry prior to dressing wounds No tub bath. Anesthetic (Use 'Patient Medications' Section for Anesthetic Order Entry): Lidocaine applied to wound bed Edema Control - Lymphedema / Segmental Compressive Device / Other: Elevate, Exercise Daily and Avoid Standing for Long Periods of Time. Elevate legs to the level of the heart and pump ankles as often as possible Elevate leg(s) parallel to the floor when sitting. Additional Orders / Instructions: Follow Nutritious Diet and Increase Protein Intake WOUND #1: - Back Wound Laterality: Midline, Distal Cleanser: Normal Saline 3 x Per Week/30 Days Discharge Instructions: Wash your hands with soap and water. Remove old dressing, discard into plastic bag and place into trash. Cleanse the wound with Normal Saline prior to applying a clean dressing using gauze sponges, not tissues or cotton balls. Do not scrub or use excessive force. Pat dry using  gauze sponges, not tissue or cotton balls. Primary Dressing: Hydrofera Blue Rope 3 x Per Week/30 Days Discharge Instructions: cut into fourths; angle the end Secondary Dressing: (BORDER) Zetuvit Plus SILICONE BORDER Dressing 4x4 (in/in) 3 x Per Week/30 Days 1. I am going to suggest that we go ahead and have the patient continue with the Hydrofera Blue rope which I think is doing quite well. 2. I am also going to recommend that we have the patient continue with the border foam dressing to cover over top of the [facility] which also has done quite well. 3. I would also suggest the patient continue to monitor for any signs of worsening or infection if anything changes she should let me know soon as possible. We will see patient back for reevaluation in 1 week here in the clinic. If anything worsens or changes patient will contact our office for additional recommendations. Electronic Signature(s) Signed: 09/30/2022 3:07:41 PM By: Worthy Keeler PA-C Entered By: Worthy Keeler on 09/30/2022 15:07:40 Ryker, ZANARIAH DIECKMAN ([id number]) [address] Al[place] Loletha Grayer ([id number]) -------------------------------------------------------------------------------- SuperBill Details Patient Name: Grondin, Zamzam C. Date of Service: 09/30/2022 Medical Record Number: [id number] Patient Account Number: [id number] Date of Birth/Sex: [birth year] (61 y.o. 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 G35 Multiple sclerosis I10 Essential (primary) hypertension Facility Procedures CPT4 Code: [id number] Description: [phone] - WOUND CARE VISIT-LEV 2 EST PT Modifier: Quantity:  1 Physician Procedures CPT4 Code: [id number] Description: [id number] - WC PHYS LEVEL  4 - EST PT Modifier: Quantity: 1 CPT4 Code: Description: ICD-10 Diagnosis Description T81.31XA Disruption of external operation (surgical) wound, not elsewhere classifi 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

## 2022-10-01 ENCOUNTER — Ambulatory Visit: Payer: 59 | Admitting: Occupational Therapy

## 2022-10-02 NOTE — Progress Notes (Signed)
[name], [name] ([id number]) Visit Report for 09/30/2022 Arrival Information Details Patient Name: [name], [name] Date of Service: 09/30/2022 2:00 PM Medical Record Number: [id number] Patient Account Number: [id number] Date of Birth/Sex: 05/30/1961 (61 y.o. F) Treating RN: Carlene Coria Primary Care Ajna Moors: Card, Jenny Reichmann Other Clinician: Referring Zanyah Lentsch: Card, John Treating Mickey Hebel/Extender: Skipper Cliche in Treatment: 8 Visit Information History Since Last Visit All ordered tests and consults were completed: No Patient Arrived: [name] Added or deleted any medications: No Arrival Time: 14:02 Any new allergies or adverse reactions: No Accompanied By: self Had a fall or experienced change in No Transfer Assistance: None activities of daily living that may affect Patient Identification Verified: Yes risk of falls: Secondary Verification Process Completed: Yes Signs or symptoms of abuse/neglect since last visito No Patient Requires Transmission-Based Precautions: No Hospitalized since last visit: No Patient Has Alerts: No Implantable device outside of the clinic excluding No cellular tissue based products placed in the center since last visit: Has Dressing in Place as Prescribed: Yes Pain Present Now: Yes Electronic Signature(s) Signed: 09/30/2022 2:54:01 PM By: Carlene Coria RN Entered By: Carlene Coria on 09/30/2022 14:54:01 Macknight, Odesser C. ([id number]) -------------------------------------------------------------------------------- Clinic Level of Care Assessment Details Patient Name: [name], [name] Date of Service: 09/30/2022 2:00 PM Medical Record Number: [id number] Patient Account Number: [id number] Date of Birth/Sex: 1961/01/15 (61 y.o. F) Treating RN: Carlene Coria Primary Care Nikos Anglemyer: Card, Jenny Reichmann Other Clinician: Referring Daleigh Pollinger: Card, John Treating Candra Wegner/Extender: Skipper Cliche in Treatment: 22 Clinic Level of Care Assessment Items TOOL 4  Quantity Score X - Use when only an EandM is performed on FOLLOW-UP visit 1 0 ASSESSMENTS - Nursing Assessment / Reassessment X - Reassessment of Co-morbidities (includes updates in patient status) 1 10 X- 1 5 Reassessment of Adherence to Treatment Plan ASSESSMENTS - Wound and Skin Assessment / Reassessment X - Simple Wound Assessment / Reassessment - one wound 1 5 []  - 0 Complex Wound Assessment / Reassessment - multiple wounds []  - 0 Dermatologic / Skin Assessment (not related to wound area) ASSESSMENTS - Focused Assessment []  - Circumferential Edema Measurements - multi extremities 0 []  - 0 Nutritional Assessment / Counseling / Intervention []  - 0 Lower Extremity Assessment (monofilament, tuning fork, pulses) []  - 0 Peripheral Arterial Disease Assessment (using hand held doppler) ASSESSMENTS - Ostomy and/or Continence Assessment and Care []  - Incontinence Assessment and Management 0 []  - 0 Ostomy Care Assessment and Management (repouching, etc.) PROCESS - Coordination of Care X - Simple Patient / Family Education for ongoing care 1 15 []  - 0 Complex (extensive) Patient / Family Education for ongoing care []  - 0 Staff obtains Programmer, systems, Records, Test Results / Process Orders []  - 0 Staff telephones HHA, Nursing Homes / Clarify orders / etc []  - 0 Routine Transfer to another Facility (non-emergent condition) []  - 0 Routine Hospital Admission (non-emergent condition) []  - 0 New Admissions / Biomedical engineer / Ordering NPWT, Apligraf, etc. []  - 0 Emergency Hospital Admission (emergent condition) X- 1 10 Simple Discharge Coordination []  - 0 Complex (extensive) Discharge Coordination PROCESS - Special Needs []  - Pediatric / Minor Patient Management 0 []  - 0 Isolation Patient Management []  - 0 Hearing / Language / Visual special needs []  - 0 Assessment of Community assistance (transportation, D/C planning, etc.) []  - 0 Additional assistance / Altered  mentation []  - 0 Support Surface(s) Assessment (bed, cushion, seat, etc.) INTERVENTIONS - Wound Cleansing / Measurement Savant, Twyla C. ([id number]) X- 1 5 Simple Wound Cleansing -  one wound []  - 0 Complex Wound Cleansing - multiple wounds X- 1 5 Wound Imaging (photographs - any number of wounds) []  - 0 Wound Tracing (instead of photographs) X- 1 5 Simple Wound Measurement - one wound []  - 0 Complex Wound Measurement - multiple wounds INTERVENTIONS - Wound Dressings []  - Small Wound Dressing one or multiple wounds 0 []  - 0 Medium Wound Dressing one or multiple wounds []  - 0 Large Wound Dressing one or multiple wounds []  - 0 Application of Medications - topical []  - 0 Application of Medications - injection INTERVENTIONS - Miscellaneous []  - External ear exam 0 []  - 0 Specimen Collection (cultures, biopsies, blood, body fluids, etc.) []  - 0 Specimen(s) / Culture(s) sent or taken to Lab for analysis []  - 0 Patient Transfer (multiple staff / / Similar devices) []  - 0 Simple Staple / Suture removal (25 or less) []  - 0 Complex Staple / Suture removal (26 or more) []  - 0 Hypo / Hyperglycemic Management (close monitor of Blood Glucose) []  - 0 Ankle / Brachial Index (ABI) - do not check if billed separately X- 1 5 Vital Signs Has the patient been seen at the hospital within the last three years: Yes Total Score: 65 Level Of Care: New/Established - Level 2 Electronic Signature(s) Signed: 10/02/2022 11:46:40 AM By: RN Entered By: on 09/30/2022 14:19:40 Broman, Leilah C ( ) -------------------------------------------------------------------------------- Encounter Discharge Information Details Patient Name: [name], [name] Date of Service: 09/30/2022 2:00 PM Medical Record Number: Patient Account Number: Date of Birth/Sex: Feb 25, 1961 (61 y.o. F) Treating RN: Nurse, adult Primary Care Raelle Chambers:  Other Clinician: Referring Onelia Cadmus: Card, John Treating Terrel Nesheiwat/Extender: in Treatment: 81 Encounter Discharge Information Items Discharge Condition: Stable Ambulatory Status: Ambulatory Discharge Destination: Home Transportation: Private Auto Accompanied By: self Schedule Follow-up Appointment: Yes Clinical Summary of Care: Electronic Signature(s) Signed: 10/02/2022 11:46:40 AM By: 12/02/2022 RN Entered By: Yevonne Pax on 09/30/2022 14:20:24 Dugal, Zurisadai C12/01/2022 (Marland Kitchen) -------------------------------------------------------------------------------- Lower Extremity Assessment Details Patient Name: Armand, Deshon C. Date of Service: 09/30/2022 2:00 PM Medical Record Number: 11/30/2022 Patient Account Number: [id number] Date of Birth/Sex: [birth year] (61 y.o. F) Treating RN: 64 Primary Care Roland Prine: Yevonne Pax Other Clinician: Referring Jarmel Linhardt: Card, John Treating Walida Cajas/Extender: [name] Weeks in Treatment: 50 Electronic Signature(s) Signed: 10/02/2022 11:46:40 AM By: 44 RN Entered By: 12/02/2022 on 09/30/2022 14:18:07 Vespa, Adelle CYevonne Pax (11/30/2022) -------------------------------------------------------------------------------- Multi Wound Chart Details Patient Name: Wojtkiewicz, Aracelia C. Date of Service: 09/30/2022 2:00 PM Medical Record Number: [id number] Patient Account Number: 11/30/2022 Date of Birth/Sex: 07/24/1961 (61 y.o. F) Treating RN: 09/11/1961 Primary Care Syrianna Schillaci: 64 Other Clinician: Referring Matthias Bogus: Card, John Treating Brenan Modesto/Extender: Yevonne Pax in Treatment: 50 Vital Signs Height(in): 58 Pulse(bpm): 88 Weight(lbs): 275 Blood Pressure(mmHg): 152/90 Body Mass Index(BMI): 57.5 Temperature(F): 98.3 Respiratory Rate(breaths/min): 18 Photos: [1:No Photos] [N/A:N/A] Wound Location: [1:Distal, Midline Back] [N/A:N/A] Wounding Event: [1:Surgical Injury] [N/A:N/A] Primary  Etiology: [1:Dehisced Wound] [N/A:N/A] Comorbid History: [1:Hypertension] [N/A:N/A] Date Acquired: [1:04/11/2021] [N/A:N/A] Weeks of Treatment: [1:50] [N/A:N/A] Wound Status: [1:Open] [N/A:N/A] Wound Recurrence: [1:No] [N/A:N/A] Measurements L x W x D (cm) [1:0.3x0.3x2.5] [N/A:N/A] Area (cm) : [1:0.071] [N/A:N/A] Volume (cm) : [1:0.177] [N/A:N/A] % Reduction in Area: [1:43.70%] [N/A:N/A] % Reduction in Volume: [1:69.40%] [N/A:N/A] Classification: [1:Full Thickness Without Exposed Support Structures] [N/A:N/A] Exudate Amount: [1:Medium] [N/A:N/A] Exudate Type: [1:Serosanguineous] [N/A:N/A] Exudate Color: [1:red, brown] [N/A:N/A] Granulation Amount: [1:Large (67-100%)] [N/A:N/A] Granulation Quality: [1:Red] [N/A:N/A] Necrotic Amount: [  1:None Present (0%)] [N/A:N/A] Exposed Structures: [1:Fat Layer (Subcutaneous Tissue): Yes Fascia: No Tendon: No Muscle: No Joint: No Bone: No None] [N/A:N/A N/A] Treatment Notes Electronic Signature(s) Signed: 10/02/2022 11:46:40 AM By: Yevonne Pax RN Entered By: Yevonne Pax on 09/30/2022 14:18:20 Gaulin, Teyonna Salena Saner ([id number]) -------------------------------------------------------------------------------- Multi-Disciplinary Care Plan Details Patient Name: Thrun, Miia C. Date of Service: 09/30/2022 2:00 PM Medical Record Number: [id number] Patient Account Number: [id number] Date of Birth/Sex: 14-Oct-1961 (61 y.o. F) Treating RN: Yevonne Pax Primary Care Lasandra Batley: [name] Other Clinician: Referring Ave Scharnhorst: Card, John Treating Analicia Skibinski/Extender: Rowan Blase in Treatment: 50 Active Inactive Wound/Skin Impairment Nursing Diagnoses: Knowledge deficit related to ulceration/compromised skin integrity Goals: Patient/caregiver will verbalize understanding of skin care regimen Date Initiated: 10/15/2021 Target Resolution Date: 11/16/2022 Goal Status: Active Ulcer/skin breakdown will have a volume reduction of 30% by week 4 Date  Initiated: 10/15/2021 Date Inactivated: 01/29/2022 Target Resolution Date: 12/15/2021 Goal Status: Unmet Unmet Reason: comorbities Ulcer/skin breakdown will have a volume reduction of 50% by week 8 Date Initiated: 10/15/2021 Date Inactivated: 01/29/2022 Target Resolution Date: 01/15/2022 Goal Status: Unmet Unmet Reason: comorbities Ulcer/skin breakdown will have a volume reduction of 80% by week 12 Date Initiated: 10/15/2021 Date Inactivated: 02/19/2022 Target Resolution Date: 02/15/2022 Goal Status: Unmet Unmet Reason: comorbities Ulcer/skin breakdown will heal within 14 weeks Date Initiated: 10/15/2021 Date Inactivated: 05/07/2022 Target Resolution Date: 03/15/2022 Goal Status: Unmet Unmet Reason: comorbities Interventions: Assess patient/caregiver ability to obtain necessary supplies Assess patient/caregiver ability to perform ulcer/skin care regimen upon admission and as needed Assess ulceration(s) every visit Notes: Electronic Signature(s) Signed: 10/02/2022 11:46:40 AM By: Yevonne Pax RN Entered By: Yevonne Pax on 09/30/2022 14:18:13 Napp, Vanessa C. ([id number]) -------------------------------------------------------------------------------- Pain Assessment Details Patient Name: Monica, Gabriela C. Date of Service: 09/30/2022 2:00 PM Medical Record Number: [id number] Patient Account Number: [id number] Date of Birth/Sex: 08-16-1961 (61 y.o. F) Treating RN: Yevonne Pax Primary Care Dellar Traber: [name] Other Clinician: Referring Phyllis Whitefield: Card, John Treating Sheli Dorin/Extender: Rowan Blase in Treatment: 50 Active Problems Location of Pain Severity and Description of Pain Patient Has Paino Yes Site Locations With Dressing Change: Yes Duration of the Pain. Constant / Intermittento Intermittent Rate the pain. Current Pain Level: 4 Worst Pain Level: 6 Least Pain Level: 1 Tolerable Pain Level: 5 Character of Pain Describe the Pain: Burning Pain Management and  Medication Current Pain Management: Medication: Yes Cold Application: No Rest: Yes Massage: No Activity: No T.E.N.S.: No Heat Application: No Leg drop or elevation: No Is the Current Pain Management Adequate: Inadequate How does your wound impact your activities of daily livingo Sleep: No Bathing: No Appetite: No Relationship With Others: No Bladder Continence: No Emotions: No Bowel Continence: No Work: No Toileting: No Drive: No Dressing: No Hobbies: No Electronic Signature(s) Signed: 09/30/2022 2:55:12 PM By: Yevonne Pax RN Entered By: Yevonne Pax on 09/30/2022 14:55:12 Augenstein, Rexann Salena Saner ([id number]) -------------------------------------------------------------------------------- Patient/Caregiver Education Details Patient Name: Westgate, Jonathon C. Date of Service: 09/30/2022 2:00 PM Medical Record Number: [id number] Patient Account Number: [id number] Date of Birth/Gender: 02/06/1961 (61 y.o. F) Treating RN: Yevonne Pax Primary Care Physician: [name] Other Clinician: Referring Physician: Card, John Treating Physician/Extender: Rowan Blase in Treatment: 76 Education Assessment Education Provided To: Patient Education Topics Provided Wound/Skin Impairment: Methods: Explain/Verbal Responses: State content correctly Electronic Signature(s) Signed: 10/02/2022 11:46:40 AM By: Yevonne Pax RN Entered By: Yevonne Pax on 09/30/2022 14:19:55 Pullara, Gary C. ([id number]) -------------------------------------------------------------------------------- Wound Assessment Details Patient Name: Barham, Wetona C. Date of Service: 09/30/2022 2:00 PM Medical Record Number: [id number]  Patient Account Number: [id number] Date of Birth/Sex: [birth year] (61 y.o. F) Treating RN: Yevonne Pax Primary Care Ephram Kornegay: Card, Jonny Ruiz Other Clinician: Referring Joda Braatz: Card, John Treating Alynna Hargrove/Extender: Rowan Blase in Treatment: 50 Wound Status Wound Number:  1 Primary Etiology: Dehisced Wound Wound Location: Distal, Midline Back Wound Status: Open Wounding Event: Surgical Injury Comorbid History: Hypertension Date Acquired: 04/11/2021 Weeks Of Treatment: 50 Clustered Wound: No Wound Measurements Length: (cm) 0.3 Width: (cm) 0.3 Depth: (cm) 2.5 Area: (cm) 0.071 Volume: (cm) 0.177 % Reduction in Area: 43.7% % Reduction in Volume: 69.4% Epithelialization: None Tunneling: No Undermining: Yes Starting Position (o'clock): 9 Ending Position (o'clock): 3 Maximum Distance: (cm) 3.4 Wound Description Classification: Full Thickness Without Exposed Support Structures Exudate Amount: Medium Exudate Type: Serosanguineous Exudate Color: red, brown Foul Odor After Cleansing: No Slough/Fibrino No Wound Bed Granulation Amount: Large (67-100%) Exposed Structure Granulation Quality: Red Fascia Exposed: No Necrotic Amount: None Present (0%) Fat Layer (Subcutaneous Tissue) Exposed: Yes Tendon Exposed: No Muscle Exposed: No Joint Exposed: No Bone Exposed: No Treatment Notes Wound #1 (Back) Wound Laterality: Midline, Distal Cleanser Normal Saline Discharge Instruction: Wash your hands with soap and water. Remove old dressing, discard into plastic bag and place into trash. Cleanse the wound with Normal Saline prior to applying a clean dressing using gauze sponges, not tissues or cotton balls. Do not scrub or use excessive force. Pat dry using gauze sponges, not tissue or cotton balls. Peri-Wound Care Topical Primary Dressing Hydrofera Blue Rope Discharge Instruction: cut into fourths; angle the end Secondary Dressing (BORDER) Zetuvit Plus SILICONE BORDER Dressing 4x4 (in/in) Secured With JADY, BRAGGS. ([id number]) Compression Wrap Compression Stockings Add-Ons Electronic Signature(s) Signed: 09/30/2022 2:52:38 PM By: Yevonne Pax RN Entered By: Yevonne Pax on 09/30/2022 14:52:38 Sada, Cristen CMarland Kitchen  ([id number]) -------------------------------------------------------------------------------- Vitals Details Patient Name: Tayloe, Margot C. Date of Service: 09/30/2022 2:00 PM Medical Record Number: [id number] Patient Account Number: [id number] Date of Birth/Sex: 07-Aug-1961 (61 y.o. F) Treating RN: Yevonne Pax Primary Care Elleen Coulibaly: [name] Other Clinician: Referring Keir Foland: Card, John Treating Christena Sunderlin/Extender: Rowan Blase in Treatment: 50 Vital Signs Time Taken: 14:09 Temperature (F): 98.3 Height (in): 58 Pulse (bpm): 88 Weight (lbs): 275 Respiratory Rate (breaths/min): 18 Body Mass Index (BMI): 57.5 Blood Pressure (mmHg): 152/90 Reference Range: 80 - 120 mg / dl Electronic Signature(s) Signed: 10/02/2022 11:46:40 AM By: Yevonne Pax RN Entered By: Yevonne Pax on 09/30/2022 14:09:49

## 2022-10-07 ENCOUNTER — Encounter: Payer: 59 | Admitting: Physician Assistant

## 2022-10-07 DIAGNOSIS — T8131XA Disruption of external operation (surgical) wound, not elsewhere classified, initial encounter: Secondary | ICD-10-CM | POA: Diagnosis not present

## 2022-10-07 NOTE — Progress Notes (Signed)
[name], [name] ([id number]) Visit Report for 10/07/2022 Chief Complaint Document Details Patient Name: [name], [name] Date of Service: 10/07/2022 2:00 PM Medical Record Number: [id number] Patient Account Number: [id number] Date of Birth/Sex: 09-19-1961 (61 y.o. F) Treating RN: [name] Primary Care Provider: Christena [name] Other Clinician: Referring Provider: Card, [name] Treating Provider/Extender: [name] in Treatment: 28 Information Obtained from: Patient Chief Complaint Surgical Back Ulcer Electronic Signature(s) Signed: 10/07/2022 2:51:56 PM By: [name] Entered By: [name] on 10/07/2022 14:51:56 [name], [name] ([id number]) -------------------------------------------------------------------------------- HPI Details Patient Name: [name], [name] Date of Service: 10/07/2022 2:00 PM Medical Record Number: [id number] Patient Account Number: [id number] Date of Birth/Sex: Feb 08, 1961 (61 y.o. 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 This was actually broken up into 3 separate surgical events. The initial surgical intervention actually was on November 05, 2021 almost a year ago. Subsequently the patient went back in February for a seroma of the area which unfortunately required her to have a repeat surgery to go in and clean this out. And then again this occurred in April where she went back in and again they felt like stitches were coming out and there was an additional seroma. She was placed in a wound VAC initially and then subsequently as it got smaller that  was discontinued. Again right now I will see anything that I think a wound VAC would help with. Nonetheless she definitely has a significant depth to the wound that is going require packing. I actually believe the Hydrofera Blue rope would probably do quite well with this the problem is as much as it is draining she probably needs this to be changed at least every day. She does not really have anyone that can help with that that is the complicating scenario here. With that being said the patient does have a history of multiple sclerosis, hypertension, and again this surgical wound dehiscence in regard to her lumbar spine region. She did have a repeat MRI which was actually completed 10/09/2021. This showed that there was no significant change in the subcutaneous fluid collection/track of the lower lumbar region. This is extending to the level of the fascia unfortunately. This seems to go all the way from the L2 level with a track extending all the way to the fascia at the L4-5 level. Again this is a significant wound and there is significant drainage but does not seem to communicate to the spinal region as far as spinal fluid or otherwise is concerned that is good news. Nonetheless she last saw Dr. Franky Macho who is her neurosurgeon on 10/01/2021 that was when he ordered this last MRI she supposed to see him next week as well. With that being said he did not feel like there was any significant issue there but was not sure why this was not healing that is when he ordered the MRI. They were wanting to make sure that this was packed appropriately by home health unfortunately the main issue currently is that home health is completely out of the picture as the patient has exhausted all the home health that that she gets for a year. She is now in a very difficult  predicament where she does not have anyone to help her change the dressing and to be honest that she is not able to do it herself with the location of  the wound being on the midline lumbar spine region. If she does not have anyone that can help it is probably can to be necessary for her to go to a facility for rehab and daily dressing changes as I feel like daily changes which is much drainage that she is having is going to be necessary. 10/23/2021 upon evaluation today patient appears to be doing decently well in regard to her back ulcer. This does seem to be draining a lot less than what it is been doing in the past. With that being said she still has quite a bit of drainage nonetheless. I do think that given time this should improve least I hope so. The good news is she does have home health coming out 3 days a week were doing it 2 days a week and she is paying someone we can to help. 10/30/2021 upon evaluation today patient appears to be doing okay in regard to her back ulcer this is not draining quite as bad as it was in the beginning but he still has quite a bit of drainage noted. I do believe that the patient would benefit from [place] going ahead forward with attempting a wound VAC using the Hydrofera Blue rope to pack with and then subsequently using the VAC externally to actually suction out and help this to fill- in. I think this is our ideal way to try to get things cleared at this point. As it stands I am not certain that we are really making a progress that we want to see near with doing it in the way we are which is packing with the rope. It is a good dressing but I do think it is insufficient for total healing. She just seems to have too much in the way of drainage at this point unfortunately. 11/06/2021 upon evaluation today patient unfortunately continues to have issues with her back ongoing. The good news is her MRI that was repeated showed signs of the size of this area in the lumbar spine region having decreased from 4 cm to 3.5 cm this is definitely not bad news at all. With that being said unfortunately she continues to have issues  with ongoing drainage not as severe as in the beginning but nonetheless still significant. I do think a wound VAC still would be a good way to go although her home health agency nurse apparently has some concerns about the possibility of not being able to keep a seal with this as they had struggles in the past. Nonetheless I explained to the patient that this is much different than what she had previous and that I really feel like it would do much better as far as getting the area taken care of without having any complications or issues here. I think that we should be able to maintain a seal. Nonetheless at this time I did discuss with the patient as well that she probably does need to have a wound VAC in order for [place] to get this moving in the right direction. 11/13/2021 upon evaluation patient's wound bed actually showed signs of significant drainage at this time. She did see the surgeon yesterday he did not see anything that appeared to be infected. Nonetheless he does appear that she is continuing to have areas here that just do not seem  to want to seal up there MRI findings have been negative but nonetheless she continues to have is the seroma that is filling in. I do feel like we need to try to widen the hole so we can get at least a half of the [facility] then this will be better than nothing at this point. 11/20/2021 upon evaluation today patient actually appears to be having less pain at this point which is good news and overall she we still do not have the results of the culture back yet it had to be sent out to [facility] and we do not have the result back yet. Is doing decently well in regard to her wound. Fortunately there does not appear to be any signs of active infection systemically nor locally at this time. 11/27/2021 upon evaluation today patient appears to be doing well with regard to her wound all things considered there does appear to be less drainage than there was previous.  Fortunately I do not see any evidence of worsening of the patient is stating that she is having some issues with back pain. This is somewhat new. Again this I think could be related to the fact that she is having some issues here with infection. We are still waiting to see what the result of her culture shows from susceptibility testing Enterococcus has been identified but we do not know if this is VRE or not. 12/04/2021 upon evaluation today patient appears to be doing well with regard to her wound all things considered. I did have a conversation with Erin from Dr. Sueanne Margarita office. Of note she notes that Dr. Drinda Butts really does not want to do anything surgical right now which I completely understand. With that being said I am still leery of how far we will make it getting this to heal short of any type of surgery to open this up and allow [place] to more appropriately packed the wound. Nonetheless I will absolutely give it our best shot as far as that is concerned. I discussed that with the patient today. She voiced understanding. The good news is the drainage today seems to be clear it is no longer cloudy as it was previous I am actually very pleased in that regard. [name], [name] ([id number]) 12/11/2021 since have last seen the patient actually did have an conversation with Dr. Franky Macho with her neurosurgeon. Again this involve the discussion around whether or not to open the wound and try to apply a wound VAC following. With that being said the decision was made that that may be the best thing to do if the patient was in agreement. Nonetheless I am actually extremely encouraged with what I am seeing today much more than I would have thought. In fact I think that we may be over packing the wound which is why she is having some discomfort at this point as there is much less of the dressing able to get and this time compared to what we saw previous. That is actually really good news. In fact the 6:00  tunnel appears to have filled in is awesome news. At the 12:00 location I am actually able to pack into that area pretty effectively at this point today. In fact I was able to get less of the packing in which I will detail below. 12/18/2021 upon evaluation today patient appears to be doing well with regard to her wound on the back. Fortunately there is no signs of active infection which is great news and overall  very pleased with where things stand today. No fevers, chills, nausea, vomiting, or diarrhea. 01/01/2022 upon evaluation today patient appears to be doing decently well in regard to her wound. Fortunately there does not appear to be any signs of anything worsening which is great news. She still continues to have issues with drainage but this is not nearly as significant as what it was in the past. There is all clear drainage no signs of purulence noted. 01/08/2022 upon evaluation today patient appears to be doing well with regard to her wound. With that being said she tells me that she has been having some increased pain over the past week. It does appear based on the amount of Hydrofera Blue that we removed this is probably over pack. Again it is difficult to know exactly how old the nurses getting all the skin but nonetheless the length of Hydrofera Blue that was utilized was way too much which may be part of the reason why this felt so uncomfortable. Fortunately I think that we can cut back on that we discussed pieces smaller so hopefully that will not be over packed. 01/22/2021 upon evaluation today patient appears to be doing well With regard to there being no signs of infection at this time. The wound does still tunnel mainly up at the 12:00 location. The depth of the tunnel complete from entry point to the base is about 3.5 cm today. With that being said I do believe that overall she is making good progress this is just a very slow process for her which I know has been frustrating as  well. 01/29/2022 upon evaluation today patient appears to be doing well with regard to the wound on her lower back and the lumbar spine region. The good news is the depth that I got this week was a little bit less going up at the 12:00 location as compared to last week. I measured this to be 3.5 cm last week and it was 3.3 cm this week. Again that is a minute shift but nonetheless a good shift in the right direction. Overall I think that we are on the right track. 02/05/2022 upon evaluation today patient appears to be doing well with regard to her wound. In fact right now her measurements are somewhere around 2.9 cm which is definitely smaller and less deep than last week At the 12:00 location. Overall I am very pleased with what we are seeing. 02/19/2022 upon evaluation today patient unfortunately is noting that the wound is actually closing up externally but internally there is still some depth to this. I discussed with her today that we cannot let it close up like this is can end up being a significant issue for her if we allow for that. Subsequently we need to do what we can to try to open this up and keep it open more effectively. She voiced understanding. With that being said we are going to proceed with that procedure today in order to debride away some of the skin on externally that is trying to close and on this. 02/26/2022 upon evaluation patient appears to be doing better in my opinion after we had to open up this area last week. Again she has a significant amount of healing and overall I am extremely pleased with where things stand currently. I do not see any evidence of active infection locally nor systemically at this time which is great news and in general I think that we are on the right track for getting  this hopefully closed final and done. 03/05/2022 upon evaluation today patient unfortunately is doing a little bit worse with regard to the whole size this is starting to get much  smaller unfortunately. There does not appear to be any signs of active infection locally nor systemically which is great news. With that being said I am concerned about the fact that the patient is doing quite a bit worse with regard to trapping some fluid and almost feels like she may have a fluid pocket that not only goes in and up towards 12:00 but looks back around towards the more superficial subcutaneous tissue. I am very concerned that this is going to be something that is very hard for [place] to heal short of another surgery to open this up and try to wound VAC from the inside out. Even then there is no guarantees this is just a very difficult situation to be perfectly honest. 03/14/2022 upon evaluation today patient appears to be doing well with regard to her wound as far as the area staying open and pleased in that regard. With that being said unfortunately she is not doing as well when it comes to the irritation around it appears to be very inflamed and red this is not good that was discussed with her today as well. Subsequently I do believe that working to need to do what we can do to try and calm this down in the past she did well with the Augmentin due to Enterococcus infection I am not sure if were dealing with the same thing or not but at the same time I think that is probably to be my go to at this point. 03/19/2022 upon evaluation today patient appears to be doing really about the same in regard to her wound that the pain is better. 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 She seems to be improved dramatically with the antibiotic currently although it does not cover for MRSA I am apt to just see how this progresses over the next several days to a week. With that being said the bigger issue here is that we simply do not seem to be making excellent progress and I am concerned about the lack of progress. I discussed with Dr. Shon Baton with [facility] in [place]  and to be honest his recommendation was that the best bet would probably be to get her to a teaching center. She is actually been seen for rheumatology and a I believe psychologist/psychiatrist at [facility]. She would prefer to go that direction as opposed to [facility] or [facility]. I am definitely okay with that. 03/26/2022 upon evaluation today patient appears to be doing okay in regard to her wound. She is not showing signs of worsening and overall she tells me that her pain is not nearly as bad as it was previous. Fortunately I do not see any evidence of active infection locally or systemically which is great news. No fevers, chills, nausea, vomiting, or diarrhea. 04-02-2022 upon evaluation today patient appears to be doing well with regard to her wound. It does not appear to be showing any signs of worsening which is great news. With that being said you are still having some issues here with drainage although it is clear and mainly just tenderness with bleeding from the [facility] I do think that treating for the second issue which is MRSA is probably the right thing to do at this point she is now off of the Augmentin. 04-09-2022 upon  evaluation today patient's wound actually appears to be doing about the same. Fortunately I do not see any evidence of active infection locally or systemically at this time which is great news. No fevers, chills, nausea, vomiting, or diarrhea. 04-16-2022 upon evaluation today patient appears to be doing well with regard to her wound in the lumbar spine region. Subsequently she continues to have an area that does have a tunnel up into the 12:00 location. This is about 3.5 cm using a skinny probe curved upwards to get into this region. With that being said I really do not see any signs of overall improvement but also do not see any signs of worsening I feel like work on about a still made here to some degree. The patient did see her neurologist and he has referred her to  neurosurgery at [facility] for second ULYANA, HARDBARGER. ([phone]:[id number]) opinion we will see what they have to say as well. 04-23-2022 upon evaluation today patient appears to be doing well with regard to her wound all things considered. She has been tolerating the dressing changes without complication. Fortunately I do not see any signs of active infection locally nor systemically which is great news. No fever chills noted 04-30-2022 upon evaluation today patient appears to be doing about the same in regard to her wound. The depth is really not dramatically improved as far as the 12:00 tunnel is concerned. The overall depth and the base of the wound does seem to be a little bit better it does sound like that external wound has closed as far as the opening is concerned we can have to cut this down from a half to a smaller size based on what we are seeing today. 05-07-2022 upon evaluation today patient's wound actually seems to be doing decently well. There is not a major shift here but a slight shift in the depth both straight and as well as in the [place] at 12:00. With that being said I again we will talk about a couple of millimeters but still every little bit can count when there are situations like this to be honest. 05-14-2022 upon evaluation today patient appears to be doing okay in regard to her wound. I do not see any signs of anything worsening. With that being said also do not see getting significantly better unfortunately. There does not appear to be any evidence of infection locally or systemically which is great news. No fevers, chills, nausea, vomiting, or diarrhea. 05-21-2022 upon evaluation today patient actually appears to be doing quite well in regard to her wound from the standpoint of there being no infection. With that being said there does not appear to be any evidence of infection locally nor systemically which is great news. No fevers, chills, nausea, vomiting, or diarrhea. 05-28-2022  upon evaluation today patient's wound actually appears to be doing about the same. I do not see any evidence of active infection locally or systemically which is great news. No fevers, chills, nausea, vomiting, or diarrhea. 06-04-2022 upon evaluation today patient appears to be doing well with regard to her wound. She has been tolerating dressing changes without complication. Fortunately I do not feel like there is any worsening also I do not feel like there is any improvement. I feel like right a solid area here where she has a tunnel that tracks up at 12:00 and then has a fluid pocket at around roughly 1:00 I can push on this and squeeze fluid out. Again I am not exactly sure  what else can be done from my standpoint to improve this. She does have an appointment with Dr. Christella Noa who I do believe is an excellent surgeon and this is the surgeon who performed this surgery initially. Again the biggest issue was following she had a lot of trouble with the wound VAC I do think that we need to try to see if she is can have a wound VAC following surgery what exactly regular need to do to help keep this moving in the right direction for her. Obviously I want her to have the best result possible that she has to go through surgery again to clean this area out. Absolutely willing to help out with getting this to heal. 06-13-2022 upon evaluation today patient appears to be doing well with regard to her wound its not any worse unfortunately it is also not significantly better which is the only issue currently. I do not see any signs of active infection locally or systemically which is great news. No fevers, chills, nausea, vomiting, or diarrhea. She does have her appointment on Monday with her neurosurgeon and we will see you Dr. Christella Noa has to say at that point. 06-18-2022 upon evaluation patient appears to be doing a little worse in regard to her wound only in the respect that has been several days since she has had  this changed in fact it was last changed on Friday. Since that time she tells me that she has kept this in place over the weekend and yesterday she was supposed to see her neurosurgeon yesterday but they had to cancel due to an emergency surgery according to what the patient tells me. Nonetheless she is having some issues here with drainage coming from the wound that is more purulent in nature I think this is something we have been seeing in the [facility] for several weeks but is more obvious being that it was not changed as much during the last week. She unfortunately is a little worried about this but again I think that this is just part of what we have been dealing with all long is more prevalent and obvious due to the length of time since it was changed last. She does have a repeat appointment for I believe July 10 she told me although she is going to see if she can find anything sooner. 06-25-2022 upon evaluation today patient's wound actually showed signs of marginal improvement which is good news. She still has the area of abscess that seems to be at roughly 2:00 when looking at her back. We can get into this with the skinny probe other than that I really do not know what else I can do to try to make this better. We discussed this and she does have an appointment with her neurosurgeon although that is not till mid July as he had to change it at the last moment during the time she was post to see him a week ago. She does have evidence of potential infection on culture I am going to go ahead and treat this in order to ensure that nothing worsens. With that being said I do believe that overall she seems to be doing well but I do think we want to make sure that that the knee gets worse in the meantime until she gets to see her neurosurgeon. 07-01-2022 upon evaluation today patient appears to continue to have trouble with her wound draining. Again we have been continue to monitor this and I still  see  the same issue that I have noted before the patient has an area which is draining seemingly from around the 2:00 location when you go up towards 12:00 and then over towards 2:00 looking at her back this is where there is actually a soft area external that if you push on it you will see fluid come out and subsequently this is also where it tracks to internally. Everything in the 6:00 location has completely healed in and this looks much better in that regard but this upper portion we just cannot get to closed with the methods that we have. It seemed to me that this is probably going to require that this area be open and cleaned out in order to allow it to heal and my suggestion would be that a wound VAC is probably going to be the ideal thing. 07-09-2022 upon evaluation today patient appears to be doing somewhat poorly in regard to her wound. She did see Dr. Franky Macho. He stated that the wound was not being "packed appropriately" according to the patient and her daughter who were present during the evaluation today as well as the evaluation with him yesterday. With that being said he packed the wound the way he said it needed to be and to be partly honest that is over packed. The patient is having a tremendous amount of discomfort and states that this is no way that she is good to be able to tolerate that. I understand the concern about the whole closing down which is why I recommended trying to keep this open is much as possible and again with that all being said this is still going to continue to be an issue as far as this closing down before everything heals up on the inside. Nonetheless she continues to have issues with significant drainage. She also has an area which almost feels like a small abscess or least of fluid pocket that feels up that was not appropriately packed with the dressings that were in place. With that being said I think that this is going to have to be opened at some point in  time in order to get this to heal I am really not certain the best way to go about this. I discussed that with the patient again today. 07-16-2022 upon evaluation today patient appears to be doing well currently in regard to her wound from the standpoint of the pain getting better which is great news. Fortunately I do not see any evidence of active infection locally or systemically which is great news. No fevers, chills, nausea, vomiting, or diarrhea. With that being said the patient has been tolerating the dressing changes better without complication which is great news. GLADYS, DECKARD ([id number]) 07-29-2022 upon evaluation today patient appears to be doing actually pretty well in regard to her wound. Fortunately there does not appear to be any signs of infection. She still has drainage and we still are seeing an area from this pocket up at 12:00 that is leaking but fortunately this does not appear to be having any significant issues with infection right now which is great news. Fortunately her pain is also calm down since I saw her 2 weeks ago. 08-05-2022 upon evaluation today patient appears to be doing unfortunately a little worse in regard to the discomfort she is feeling she tells me that this is bothering her pretty much all the time at a low level. It never seems to go away. This is since last week. With that being  said the overall depth is unfortunately a little bit deeper this week which is definitely not what I was hoping to see. I did get to review the second opinion that was requested by the patient from her insurance to have an independent physician review the situation here. The short story of that reviewed which I did read through the entirety of she states that the patient does likely need surgery to correct the seroma considering length of time this has been going on. It was recommended that a plastic surgeon be the one to have this up in case there is a muscle flap necessary but at  minimum that this area needs to be cleaned out and then appropriately close following. The patient has also read through this. She does seem to be a little bit off of her normal game not feeling quite that well today. She is not exactly sure what is going on. 08-12-2022 upon evaluation today patient's wound is actually showing signs of maintaining stability which is good news. Fortunately there does not appear to be any evidence of active infection locally or systemically at this time which is excellent. With that being said she does have a meeting with her nurse with [place] [facility] on Thursday to discuss options for plastic surgery where she is able to go. 08-19-2022 upon evaluation today patient appears to be doing well currently in regard to her wound all things considered. Unfortunately the patient is still continuing to have some issues here with an ongoing open wound in the back despite everything that we have tried she has had an independent review which recommended surgical intervention. Where the process of trying to find someone to take her on as a patient. The suggestion was for plastic surgery which I think is probably a good option here. With that being said she has been looking through her insurance to see who was in network for her and has found someone that I think will be very good. Working to see about making that referral to them. 08-26-2022 upon evaluation today patient appears to be doing well currently in regard to her wound. She tells me she is having some discomfort fortunately there does not appear to be any evidence of active infection at this time which is great news. We have actually gotten her an appointment finally with the plastic surgeon that is 4 I believe it is September 26. 09-03-2022 upon evaluation today patient appears to be doing well currently in regard to her wound everything appears to be stable which is good in that regard. With that being said the drainage  does appear to be a little more thick compared to normal she has been having a lot of irritation as well. I do believe that it may be at the point that the repeating the Augmentin that she is previously done well with in the past could be of benefit for her here currently. The patient voiced understanding she has not had this since September 27 the last time I prescribed this for her. Nonetheless it did do a very good job to calm things down quite a bit back at that time. 09-09-2022 upon evaluation today patient appears to be doing well currently in regard to her wound she is stable. There does not appear to be anything worsening at this point. Fortunately I see no signs of active infection at this time. No fevers, chills, nausea, vomiting, or diarrhea. 09-16-2022 upon evaluation today patient appears to be doing well currently regard to  her wound there does not appear to be any signs of active infection locally or systemically at this time which is great news. No fevers, chills, nausea, vomiting, or diarrhea. 09-23-2022 upon evaluation today patient appears to be doing well currently in regard to her wound all things considered this is still stable although not significantly better. There is still some purulent drainage noted again she has been on antibiotics this continues to be of concern. Fortunately there does not appear to be any evidence of active infection locally or systemically at this time which is great news. 09-30-2022 upon evaluation today patient appears to be doing very well in regard to her wound and things are stable. She also did see the surgeon, Dr. Ferd Hibbs at [facility] in [place]. She actually has a plan to go forward with performing surgery in order to close this wound which I think is definitely going to be the right thing to do. Fortunately I do not see any signs of active infection locally or systemically which is great news and my hope is they will be able to surgically get  this closed so that she can move on from this aspect of her life where she has been really struggling to get back to more normal situation. 10-07-2022 upon evaluation today patient appears to be doing well currently in regard to her wound. This is showing signs of continued stability I do not see any signs of anything worsening which is great news and overall made it very pleased in that regard. No fevers, chills, nausea, vomiting, or diarrhea. Electronic Signature(s) Signed: 10/07/2022 2:55:22 PM By: [name] Entered By: [name] on 10/07/2022 14:55:22 Llera, Bessie Salena [name] ([id number]) -------------------------------------------------------------------------------- Physical Exam Details Patient Name: Boom, Melainie [name]. Date of Service: 10/07/2022 2:00 PM Medical Record Number: [id number] Patient Account Number: [id number] Date of Birth/Sex: 10/27/1961 (61 y.o. F) Treating RN: [name] Primary Care Provider: Christena [name] Other Clinician: Referring Provider: Card, [name] Treating Provider/Extender: [name] in Treatment: 70 Constitutional Well-nourished and well-hydrated in no acute distress. Respiratory normal breathing without difficulty. Psychiatric this patient is able to make decisions and demonstrates good insight into disease process. Alert and Oriented x 3. pleasant and cooperative. Notes Upon inspection patient's tunnel still goes up towards 12:00 area. This is still an ongoing issue and again she is awaiting hearing from the plastic surgeon about the next steps as far as correcting this is concerned. Electronic Signature(s) Signed: 10/07/2022 2:55:59 PM By: [name] Entered By: [name] on 10/07/2022 14:55:58 [name], [name] ([id number]) -------------------------------------------------------------------------------- Problem List Details Patient Name: Corkins, Jahna [name]. Date of Service: 10/07/2022 2:00 PM Medical Record Number:  [id number] Patient Account Number: [id number] Date of Birth/Sex: Jul 24, 1961 (61 y.o. 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 ([id number]) -------------------------------------------------------------------------------- Progress Note Details Patient Name: Bosher, Llewellyn [name]. Date of Service: 10/07/2022 2:00 PM Medical Record Number: [id number] Patient Account Number: [id number] Date of Birth/Sex: 03/21/1961 (  60 y.o. F) Treating RN: [name] Primary Care Provider: Christena [name] Other Clinician: Referring Provider: Card, [name] Treating Provider/Extender: [name] in Treatment: 41 Subjective Chief Complaint Information obtained from Patient Surgical Back Ulcer History of Present Illness (HPI) 10/15/2021 upon evaluation today patient presents for initial evaluation here in the clinic concerning a surgical ulceration/dehiscence in the lumbar spine region following surgery that she had over the past year. This was actually broken up into 3 separate surgical events. The initial surgical intervention actually was on November 05, 2021 almost a year ago. Subsequently the patient went back in February for a seroma of the area which unfortunately required her to have a repeat surgery to go in and clean  this out. And then again this occurred in April where she went back in and again they felt like stitches were coming out and there was an additional seroma. She was placed in a wound VAC initially and then subsequently as it got smaller that was discontinued. Again right now I will see anything that I think a wound VAC would help with. Nonetheless she definitely has a significant depth to the wound that is going require packing. I actually believe the Hydrofera Blue rope would probably do quite well with this the problem is as much as it is draining she probably needs this to be changed at least every day. She does not really have anyone that can help with that that is the complicating scenario here. With that being said the patient does have a history of multiple sclerosis, hypertension, and again this surgical wound dehiscence in regard to her lumbar spine region. She did have a repeat MRI which was actually completed 10/09/2021. This showed that there was no significant change in the subcutaneous fluid collection/track of the lower lumbar region. This is extending to the level of the fascia unfortunately. This seems to go all the way from the L2 level with a track extending all the way to the fascia at the L4-5 level. Again this is a significant wound and there is significant drainage but does not seem to communicate to the spinal region as far as spinal fluid or otherwise is concerned that is good news. Nonetheless she last saw Dr. Franky Macho who is her neurosurgeon on 10/01/2021 that was when he ordered this last MRI she supposed to see him next week as well. With that being said he did not feel like there was any significant issue there but was not sure why this was not healing that is when he ordered the MRI. They were wanting to make sure that this was packed appropriately by home health unfortunately the main issue currently is that home health is completely out of the picture as the patient  has exhausted all the home health that that she gets for a year. She is now in a very difficult predicament where she does not have anyone to help her change the dressing and to be honest that she is not able to do it herself with the location of the wound being on the midline lumbar spine region. If she does not have anyone that can help it is probably can to be necessary for her to go to a facility for rehab and daily dressing changes as I feel like daily changes which is much drainage that she is having is going to be necessary. 10/23/2021 upon evaluation today patient appears to be doing decently well in regard to her back ulcer. This does seem to be  draining a lot less than what it is been doing in the past. With that being said she still has quite a bit of drainage nonetheless. I do think that given time this should improve least I hope so. The good news is she does have home health coming out 3 days a week were doing it 2 days a week and she is paying someone we can to help. 10/30/2021 upon evaluation today patient appears to be doing okay in regard to her back ulcer this is not draining quite as bad as it was in the beginning but he still has quite a bit of drainage noted. I do believe that the patient would benefit from [place] going ahead forward with attempting a wound VAC using the Hydrofera Blue rope to pack with and then subsequently using the VAC externally to actually suction out and help this to fill- in. I think this is our ideal way to try to get things cleared at this point. As it stands I am not certain that we are really making a progress that we want to see near with doing it in the way we are which is packing with the rope. It is a good dressing but I do think it is insufficient for total healing. She just seems to have too much in the way of drainage at this point unfortunately. 11/06/2021 upon evaluation today patient unfortunately continues to have issues with her back ongoing. The  good news is her MRI that was repeated showed signs of the size of this area in the lumbar spine region having decreased from 4 cm to 3.5 cm this is definitely not bad news at all. With that being said unfortunately she continues to have issues with ongoing drainage not as severe as in the beginning but nonetheless still significant. I do think a wound VAC still would be a good way to go although her home health agency nurse apparently has some concerns about the possibility of not being able to keep a seal with this as they had struggles in the past. Nonetheless I explained to the patient that this is much different than what she had previous and that I really feel like it would do much better as far as getting the area taken care of without having any complications or issues here. I think that we should be able to maintain a seal. Nonetheless at this time I did discuss with the patient as well that she probably does need to have a wound VAC in order for [place] to get this moving in the right direction. 11/13/2021 upon evaluation patient's wound bed actually showed signs of significant drainage at this time. She did see the surgeon yesterday he did not see anything that appeared to be infected. Nonetheless he does appear that she is continuing to have areas here that just do not seem to want to seal up there MRI findings have been negative but nonetheless she continues to have is the seroma that is filling in. I do feel like we need to try to widen the hole so we can get at least a half of the [facility] then this will be better than nothing at this point. 11/20/2021 upon evaluation today patient actually appears to be having less pain at this point which is good news and overall she we still do not have the results of the culture back yet it had to be sent out to [facility] and we do not have the result back yet. Is  doing decently well in regard to her wound. Fortunately there does not appear to be any  signs of active infection systemically nor locally at this time. 11/27/2021 upon evaluation today patient appears to be doing well with regard to her wound all things considered there does appear to be less drainage than there was previous. Fortunately I do not see any evidence of worsening of the patient is stating that she is having some issues with back pain. This is somewhat new. Again this I think could be related to the fact that she is having some issues here with infection. We are still waiting to see what the result of her culture shows from susceptibility testing Enterococcus has been identified but we do not know if this is VRE or not. 12/04/2021 upon evaluation today patient appears to be doing well with regard to her wound all things considered. I did have a conversation with Erin from Dr. Sueanne Margarita office. Of note she notes that Dr. Drinda Butts really does not want to do anything surgical right now which I completely [name], [name] [name]. ([id number]) understand. With that being said I am still leery of how far we will make it getting this to heal short of any type of surgery to open this up and allow [place] to more appropriately packed the wound. Nonetheless I will absolutely give it our best shot as far as that is concerned. I discussed that with the patient today. She voiced understanding. The good news is the drainage today seems to be clear it is no longer cloudy as it was previous I am actually very pleased in that regard. 12/11/2021 since have last seen the patient actually did have an conversation with Dr. Franky Macho with her neurosurgeon. Again this involve the discussion around whether or not to open the wound and try to apply a wound VAC following. With that being said the decision was made that that may be the best thing to do if the patient was in agreement. Nonetheless I am actually extremely encouraged with what I am seeing today much more than I would have thought. In fact I think that  we may be over packing the wound which is why she is having some discomfort at this point as there is much less of the dressing able to get and this time compared to what we saw previous. That is actually really good news. In fact the 6:00 tunnel appears to have filled in is awesome news. At the 12:00 location I am actually able to pack into that area pretty effectively at this point today. In fact I was able to get less of the packing in which I will detail below. 12/18/2021 upon evaluation today patient appears to be doing well with regard to her wound on the back. Fortunately there is no signs of active infection which is great news and overall very pleased with where things stand today. No fevers, chills, nausea, vomiting, or diarrhea. 01/01/2022 upon evaluation today patient appears to be doing decently well in regard to her wound. Fortunately there does not appear to be any signs of anything worsening which is great news. She still continues to have issues with drainage but this is not nearly as significant as what it was in the past. There is all clear drainage no signs of purulence noted. 01/08/2022 upon evaluation today patient appears to be doing well with regard to her wound. With that being said she tells me that she has been having some increased pain over the  past week. It does appear based on the amount of Hydrofera Blue that we removed this is probably over pack. Again it is difficult to know exactly how old the nurses getting all the skin but nonetheless the length of Hydrofera Blue that was utilized was way too much which may be part of the reason why this felt so uncomfortable. Fortunately I think that we can cut back on that we discussed pieces smaller so hopefully that will not be over packed. 01/22/2021 upon evaluation today patient appears to be doing well With regard to there being no signs of infection at this time. The wound does still tunnel mainly up at the 12:00 location. The  depth of the tunnel complete from entry point to the base is about 3.5 cm today. With that being said I do believe that overall she is making good progress this is just a very slow process for her which I know has been frustrating as well. 01/29/2022 upon evaluation today patient appears to be doing well with regard to the wound on her lower back and the lumbar spine region. The good news is the depth that I got this week was a little bit less going up at the 12:00 location as compared to last week. I measured this to be 3.5 cm last week and it was 3.3 cm this week. Again that is a minute shift but nonetheless a good shift in the right direction. Overall I think that we are on the right track. 02/05/2022 upon evaluation today patient appears to be doing well with regard to her wound. In fact right now her measurements are somewhere around 2.9 cm which is definitely smaller and less deep than last week At the 12:00 location. Overall I am very pleased with what we are seeing. 02/19/2022 upon evaluation today patient unfortunately is noting that the wound is actually closing up externally but internally there is still some depth to this. I discussed with her today that we cannot let it close up like this is can end up being a significant issue for her if we allow for that. Subsequently we need to do what we can to try to open this up and keep it open more effectively. She voiced understanding. With that being said we are going to proceed with that procedure today in order to debride away some of the skin on externally that is trying to close and on this. 02/26/2022 upon evaluation patient appears to be doing better in my opinion after we had to open up this area last week. Again she has a significant amount of healing and overall I am extremely pleased with where things stand currently. I do not see any evidence of active infection locally nor systemically at this time which is great news and in general I  think that we are on the right track for getting this hopefully closed final and done. 03/05/2022 upon evaluation today patient unfortunately is doing a little bit worse with regard to the whole size this is starting to get much smaller unfortunately. There does not appear to be any signs of active infection locally nor systemically which is great news. With that being said I am concerned about the fact that the patient is doing quite a bit worse with regard to trapping some fluid and almost feels like she may have a fluid pocket that not only goes in and up towards 12:00 but looks back around towards the more superficial subcutaneous tissue. I am very concerned that  this is going to be something that is very hard for [place] to heal short of another surgery to open this up and try to wound VAC from the inside out. Even then there is no guarantees this is just a very difficult situation to be perfectly honest. 03/14/2022 upon evaluation today patient appears to be doing well with regard to her wound as far as the area staying open and pleased in that regard. With that being said unfortunately she is not doing as well when it comes to the irritation around it appears to be very inflamed and red this is not good that was discussed with her today as well. Subsequently I do believe that working to need to do what we can do to try and calm this down in the past she did well with the Augmentin due to Enterococcus infection I am not sure if were dealing with the same thing or not but at the same time I think that is probably to be my go to at this point. 03/19/2022 upon evaluation today patient appears to be doing really about the same in regard to her wound that the pain is better. 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 She seems to be improved dramatically with the antibiotic currently although it does not cover for MRSA I am apt to just see how this progresses over the next several  days to a week. With that being said the bigger issue here is that we simply do not seem to be making excellent progress and I am concerned about the lack of progress. I discussed with Dr. Shon Baton with [facility] in [place] and to be honest his recommendation was that the best bet would probably be to get her to a teaching center. She is actually been seen for rheumatology and a I believe psychologist/psychiatrist at [facility]. She would prefer to go that direction as opposed to [facility] or [facility]. I am definitely okay with that. 03/26/2022 upon evaluation today patient appears to be doing okay in regard to her wound. She is not showing signs of worsening and overall she tells me that her pain is not nearly as bad as it was previous. Fortunately I do not see any evidence of active infection locally or systemically which is great news. No fevers, chills, nausea, vomiting, or diarrhea. 04-02-2022 upon evaluation today patient appears to be doing well with regard to her wound. It does not appear to be showing any signs of worsening which is great news. With that being said you are still having some issues here with drainage although it is clear and mainly just tenderness with bleeding from the [facility] I do think that treating for the second issue which is MRSA is probably the right thing to do at this point she is now off of the Augmentin. 04-09-2022 upon evaluation today patient's wound actually appears to be doing about the same. Fortunately I do not see any evidence of active infection locally or systemically at this time which is great news. No fevers, chills, nausea, vomiting, or diarrhea. SAHIRAH, RUDELL ([id number]) 04-16-2022 upon evaluation today patient appears to be doing well with regard to her wound in the lumbar spine region. Subsequently she continues to have an area that does have a tunnel up into the 12:00 location. This is about 3.5 cm using a skinny probe curved upwards to get into  this region. With that being said I really do not see any signs of overall  improvement but also do not see any signs of worsening I feel like work on about a still made here to some degree. The patient did see her neurologist and he has referred her to neurosurgery at [facility] for second opinion we will see what they have to say as well. 04-23-2022 upon evaluation today patient appears to be doing well with regard to her wound all things considered. She has been tolerating the dressing changes without complication. Fortunately I do not see any signs of active infection locally nor systemically which is great news. No fever chills noted 04-30-2022 upon evaluation today patient appears to be doing about the same in regard to her wound. The depth is really not dramatically improved as far as the 12:00 tunnel is concerned. The overall depth and the base of the wound does seem to be a little bit better it does sound like that external wound has closed as far as the opening is concerned we can have to cut this down from a half to a smaller size based on what we are seeing today. 05-07-2022 upon evaluation today patient's wound actually seems to be doing decently well. There is not a major shift here but a slight shift in the depth both straight and as well as in the [place] at 12:00. With that being said I again we will talk about a couple of millimeters but still every little bit can count when there are situations like this to be honest. 05-14-2022 upon evaluation today patient appears to be doing okay in regard to her wound. I do not see any signs of anything worsening. With that being said also do not see getting significantly better unfortunately. There does not appear to be any evidence of infection locally or systemically which is great news. No fevers, chills, nausea, vomiting, or diarrhea. 05-21-2022 upon evaluation today patient actually appears to be doing quite well in regard to her wound from the  standpoint of there being no infection. With that being said there does not appear to be any evidence of infection locally nor systemically which is great news. No fevers, chills, nausea, vomiting, or diarrhea. 05-28-2022 upon evaluation today patient's wound actually appears to be doing about the same. I do not see any evidence of active infection locally or systemically which is great news. No fevers, chills, nausea, vomiting, or diarrhea. 06-04-2022 upon evaluation today patient appears to be doing well with regard to her wound. She has been tolerating dressing changes without complication. Fortunately I do not feel like there is any worsening also I do not feel like there is any improvement. I feel like right a solid area here where she has a tunnel that tracks up at 12:00 and then has a fluid pocket at around roughly 1:00 I can push on this and squeeze fluid out. Again I am not exactly sure what else can be done from my standpoint to improve this. She does have an appointment with Dr. Franky Macho who I do believe is an excellent surgeon and this is the surgeon who performed this surgery initially. Again the biggest issue was following she had a lot of trouble with the wound VAC I do think that we need to try to see if she is can have a wound VAC following surgery what exactly regular need to do to help keep this moving in the right direction for her. Obviously I want her to have the best result possible that she has to go through surgery again to clean  this area out. Absolutely willing to help out with getting this to heal. 06-13-2022 upon evaluation today patient appears to be doing well with regard to her wound its not any worse unfortunately it is also not significantly better which is the only issue currently. I do not see any signs of active infection locally or systemically which is great news. No fevers, chills, nausea, vomiting, or diarrhea. She does have her appointment on Monday with her  neurosurgeon and we will see you Dr. Franky Macho has to say at that point. 06-18-2022 upon evaluation patient appears to be doing a little worse in regard to her wound only in the respect that has been several days since she has had this changed in fact it was last changed on Friday. Since that time she tells me that she has kept this in place over the weekend and yesterday she was supposed to see her neurosurgeon yesterday but they had to cancel due to an emergency surgery according to what the patient tells me. Nonetheless she is having some issues here with drainage coming from the wound that is more purulent in nature I think this is something we have been seeing in the [facility] for several weeks but is more obvious being that it was not changed as much during the last week. She unfortunately is a little worried about this but again I think that this is just part of what we have been dealing with all long is more prevalent and obvious due to the length of time since it was changed last. She does have a repeat appointment for I believe July 10 she told me although she is going to see if she can find anything sooner. 06-25-2022 upon evaluation today patient's wound actually showed signs of marginal improvement which is good news. She still has the area of abscess that seems to be at roughly 2:00 when looking at her back. We can get into this with the skinny probe other than that I really do not know what else I can do to try to make this better. We discussed this and she does have an appointment with her neurosurgeon although that is not till mid July as he had to change it at the last moment during the time she was post to see him a week ago. She does have evidence of potential infection on culture I am going to go ahead and treat this in order to ensure that nothing worsens. With that being said I do believe that overall she seems to be doing well but I do think we want to make sure that that the  knee gets worse in the meantime until she gets to see her neurosurgeon. 07-01-2022 upon evaluation today patient appears to continue to have trouble with her wound draining. Again we have been continue to monitor this and I still see the same issue that I have noted before the patient has an area which is draining seemingly from around the 2:00 location when you go up towards 12:00 and then over towards 2:00 looking at her back this is where there is actually a soft area external that if you push on it you will see fluid come out and subsequently this is also where it tracks to internally. Everything in the 6:00 location has completely healed in and this looks much better in that regard but this upper portion we just cannot get to closed with the methods that we have. It seemed to me that this is probably  going to require that this area be open and cleaned out in order to allow it to heal and my suggestion would be that a wound VAC is probably going to be the ideal thing. 07-09-2022 upon evaluation today patient appears to be doing somewhat poorly in regard to her wound. She did see Dr. Christella Noa. He stated that the wound was not being "packed appropriately" according to the patient and her daughter who were present during the evaluation today as well as the evaluation with him yesterday. With that being said he packed the wound the way he said it needed to be and to be partly honest that is over packed. The patient is having a tremendous amount of discomfort and states that this is no way that she is good to be able to tolerate that. I understand the concern about the whole closing down which is why I recommended trying to keep this open is much as possible and again with that all being said this is still going to continue to be an issue as far as this closing down before everything heals up on the inside. Nonetheless she continues to have issues with significant drainage. She also has an area which  almost feels like a small abscess or least of fluid pocket that feels up that was not appropriately packed with the dressings that were in place. With that being said I think that this is going to have to be opened at some point in time in order to get this to heal I am really not certain the best way to go about this. I discussed that with the patient again today. BRIANN, SARCHET ([id number]) 07-16-2022 upon evaluation today patient appears to be doing well currently in regard to her wound from the standpoint of the pain getting better which is great news. Fortunately I do not see any evidence of active infection locally or systemically which is great news. No fevers, chills, nausea, vomiting, or diarrhea. With that being said the patient has been tolerating the dressing changes better without complication which is great news. 07-29-2022 upon evaluation today patient appears to be doing actually pretty well in regard to her wound. Fortunately there does not appear to be any signs of infection. She still has drainage and we still are seeing an area from this pocket up at 12:00 that is leaking but fortunately this does not appear to be having any significant issues with infection right now which is great news. Fortunately her pain is also calm down since I saw her 2 weeks ago. 08-05-2022 upon evaluation today patient appears to be doing unfortunately a little worse in regard to the discomfort she is feeling she tells me that this is bothering her pretty much all the time at a low level. It never seems to go away. This is since last week. With that being said the overall depth is unfortunately a little bit deeper this week which is definitely not what I was hoping to see. I did get to review the second opinion that was requested by the patient from her insurance to have an independent physician review the situation here. The short story of that reviewed which I did read through the entirety of she  states that the patient does likely need surgery to correct the seroma considering length of time this has been going on. It was recommended that a plastic surgeon be the one to have this up in case there is a muscle flap necessary but at minimum  that this area needs to be cleaned out and then appropriately close following. The patient has also read through this. She does seem to be a little bit off of her normal game not feeling quite that well today. She is not exactly sure what is going on. 08-12-2022 upon evaluation today patient's wound is actually showing signs of maintaining stability which is good news. Fortunately there does not appear to be any evidence of active infection locally or systemically at this time which is excellent. With that being said she does have a meeting with her nurse with [facility] on Thursday to discuss options for plastic surgery where she is able to go. 08-19-2022 upon evaluation today patient appears to be doing well currently in regard to her wound all things considered. Unfortunately the patient is still continuing to have some issues here with an ongoing open wound in the back despite everything that we have tried she has had an independent review which recommended surgical intervention. Where the process of trying to find someone to take her on as a patient. The suggestion was for plastic surgery which I think is probably a good option here. With that being said she has been looking through her insurance to see who was in network for her and has found someone that I think will be very good. Working to see about making that referral to them. 08-26-2022 upon evaluation today patient appears to be doing well currently in regard to her wound. She tells me she is having some discomfort fortunately there does not appear to be any evidence of active infection at this time which is great news. We have actually gotten her an appointment finally with the plastic  surgeon that is 4 I believe it is September 26. 09-03-2022 upon evaluation today patient appears to be doing well currently in regard to her wound everything appears to be stable which is good in that regard. With that being said the drainage does appear to be a little more thick compared to normal she has been having a lot of irritation as well. I do believe that it may be at the point that the repeating the Augmentin that she is previously done well with in the past could be of benefit for her here currently. The patient voiced understanding she has not had this since September 27 the last time I prescribed this for her. Nonetheless it did do a very good job to calm things down quite a bit back at that time. 09-09-2022 upon evaluation today patient appears to be doing well currently in regard to her wound she is stable. There does not appear to be anything worsening at this point. Fortunately I see no signs of active infection at this time. No fevers, chills, nausea, vomiting, or diarrhea. 09-16-2022 upon evaluation today patient appears to be doing well currently regard to her wound there does not appear to be any signs of active infection locally or systemically at this time which is great news. No fevers, chills, nausea, vomiting, or diarrhea. 09-23-2022 upon evaluation today patient appears to be doing well currently in regard to her wound all things considered this is still stable although not significantly better. There is still some purulent drainage noted again she has been on antibiotics this continues to be of concern. Fortunately there does not appear to be any evidence of active infection locally or systemically at this time which is great news. 09-30-2022 upon evaluation today patient appears to be doing very well in  regard to her wound and things are stable. She also did see the surgeon, Dr. Ferd Hibbs at [facility] in [place]. She actually has a plan to go forward with performing surgery in  order to close this wound which I think is definitely going to be the right thing to do. Fortunately I do not see any signs of active infection locally or systemically which is great news and my hope is they will be able to surgically get this closed so that she can move on from this aspect of her life where she has been really struggling to get back to more normal situation. 10-07-2022 upon evaluation today patient appears to be doing well currently in regard to her wound. This is showing signs of continued stability I do not see any signs of anything worsening which is great news and overall made it very pleased in that regard. No fevers, chills, nausea, vomiting, or diarrhea. Objective Constitutional Well-nourished and well-hydrated in no acute distress. Vitals Time Taken: 2:10 PM, Height: 58 in, Weight: 275 lbs, BMI: 57.5, Temperature: 97.9 F, Pulse: 79 bpm, Respiratory Rate: 16 breaths/min, Blood Pressure: 158/90 mmHg. Respiratory normal breathing without difficulty. Carchi, Analia [name]. ([id number]) Psychiatric this patient is able to make decisions and demonstrates good insight into disease process. Alert and Oriented x 3. pleasant and cooperative. General Notes: Upon inspection patient's tunnel still goes up towards 12:00 area. This is still an ongoing issue and again she is awaiting hearing from the plastic surgeon about the next steps as far as correcting this is concerned. Integumentary (Hair, Skin) Wound #1 status is Open. Original cause of wound was Surgical Injury. The date acquired was: 04/11/2021. The wound has been in treatment 51 weeks. The wound is located on the Distal,Midline Back. The wound measures 0.3cm length x 0.3cm width x 2.5cm depth; 0.071cm^2 area and 0.177cm^3 volume. There is Fat Layer (Subcutaneous Tissue) exposed. There is no tunneling or undermining noted. There is a medium amount of serosanguineous drainage noted. There is large (67-100%) red granulation  within the wound bed. There is no necrotic tissue within the wound bed. 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 I am going to suggest that we have the patient going continue with the recommendation for wound care measures as before and she is in agreement with the plan. This includes the Hydrofera Blue rope which I think is doing a good job here. 2. I am also can recommend that we have the patient continue to monitor for any signs of worsening or infection. Obviously if anything changes she knows to let me know she still having pain is not any worse than before but is also not any better. We will see patient back for reevaluation in 1 week here in the clinic. If anything worsens or changes patient will contact our office for additional recommendations. Electronic Signature(s) Signed: 10/07/2022 2:56:30 PM By: [name] Entered By: [name] on 10/07/2022 14:56:30 Blades, Domingo Pulse ([id number]) -------------------------------------------------------------------------------- SuperBill Details Patient Name: Stapp, Vianey [name]. Date of Service: 10/07/2022 Medical Record Number: [id number] Patient Account Number: [id number] Date of Birth/Sex: 1961-12-24 (61 y.o. 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 G35 Multiple sclerosis I10 Essential (primary) hypertension Physician Procedures CPT4 Code: [id number] Description: 99213 -  WC PHYS LEVEL 3 - EST PT Modifier: Quantity: 1 CPT4 Code: Description: ICD-10 Diagnosis Description T81.31XA Disruption of external operation (surgical) wound, not elsewhere classifi 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

## 2022-10-11 NOTE — Progress Notes (Signed)
NASEEM, MAKHOUL ([id number]) Visit Report for 10/07/2022 Arrival Information Details Patient Name: [name], [name] Date of Service: 10/07/2022 2:00 PM Medical Record Number: [id number] Patient Account Number: [id number] Date of Birth/Sex: 11/21/1961 (61 y.o. F) Treating RN: Carlene Coria Primary Care Kayceon Oki: Card, Jenny Reichmann Other Clinician: Referring Baby Gieger: Card, John Treating Marializ Ferrebee/Extender: Skipper Cliche in Treatment: 68 Visit Information History Since Last Visit All ordered tests and consults were completed: No Patient Arrived: [name] Added or deleted any medications: No Arrival Time: 14:19 Any new allergies or adverse reactions: No Accompanied By: self Had a fall or experienced change in No Transfer Assistance: None activities of daily living that may affect Patient Identification Verified: Yes risk of falls: Secondary Verification Process Completed: Yes Signs or symptoms of abuse/neglect since last visito No Patient Requires Transmission-Based Precautions: No Hospitalized since last visit: No Patient Has Alerts: No Implantable device outside of the clinic excluding No cellular tissue based products placed in the center since last visit: Has Dressing in Place as Prescribed: Yes Pain Present Now: Yes Electronic Signature(s) Signed: 10/11/2022 12:25:01 PM By: Carlene Coria RN Entered By: Carlene Coria on 10/07/2022 14:19:43 Tufaro, Daizy CMarland Kitchen ([id number]) -------------------------------------------------------------------------------- Clinic Level of Care Assessment Details Patient Name: Hai, Shaka C. Date of Service: 10/07/2022 2:00 PM Medical Record Number: [id number] Patient Account Number: [id number] Date of Birth/Sex: 1961/10/06 (61 y.o. F) Treating RN: Carlene Coria Primary Care Kaymarie Wynn: Card, Jenny Reichmann Other Clinician: Referring Viana Sleep: Card, John Treating [place]/Extender: Skipper Cliche in Treatment: 21 Clinic Level of Care Assessment Items TOOL  4 Quantity Score X - Use when only an EandM is performed on FOLLOW-UP visit 1 0 ASSESSMENTS - Nursing Assessment / Reassessment X - Reassessment of Co-morbidities (includes updates in patient status) 1 10 X- 1 5 Reassessment of Adherence to Treatment Plan ASSESSMENTS - Wound and Skin Assessment / Reassessment X - Simple Wound Assessment / Reassessment - one wound 1 5 []  - 0 Complex Wound Assessment / Reassessment - multiple wounds []  - 0 Dermatologic / Skin Assessment (not related to wound area) ASSESSMENTS - Focused Assessment []  - Circumferential Edema Measurements - multi extremities 0 []  - 0 Nutritional Assessment / Counseling / Intervention []  - 0 Lower Extremity Assessment (monofilament, tuning fork, pulses) []  - 0 Peripheral Arterial Disease Assessment (using hand held doppler) ASSESSMENTS - Ostomy and/or Continence Assessment and Care []  - Incontinence Assessment and Management 0 []  - 0 Ostomy Care Assessment and Management (repouching, etc.) PROCESS - Coordination of Care X - Simple Patient / Family Education for ongoing care 1 15 []  - 0 Complex (extensive) Patient / Family Education for ongoing care []  - 0 Staff obtains Programmer, systems, Records, Test Results / Process Orders []  - 0 Staff telephones HHA, Nursing Homes / Clarify orders / etc []  - 0 Routine Transfer to another Facility (non-emergent condition) []  - 0 Routine Hospital Admission (non-emergent condition) []  - 0 New Admissions / Biomedical engineer / Ordering NPWT, Apligraf, etc. []  - 0 Emergency Hospital Admission (emergent condition) X- 1 10 Simple Discharge Coordination []  - 0 Complex (extensive) Discharge Coordination PROCESS - Special Needs []  - Pediatric / Minor Patient Management 0 []  - 0 Isolation Patient Management []  - 0 Hearing / Language / Visual special needs []  - 0 Assessment of Community assistance (transportation, D/C planning, etc.) []  - 0 Additional assistance / Altered  mentation []  - 0 Support Surface(s) Assessment (bed, cushion, seat, etc.) INTERVENTIONS - Wound Cleansing / Measurement Mcevers, Annebelle C. ([id number]) X- 1 5 Simple Wound Cleansing -  one wound []  - 0 Complex Wound Cleansing - multiple wounds X- 1 5 Wound Imaging (photographs - any number of wounds) []  - 0 Wound Tracing (instead of photographs) X- 1 5 Simple Wound Measurement - one wound []  - 0 Complex Wound Measurement - multiple wounds INTERVENTIONS - Wound Dressings X - Small Wound Dressing one or multiple wounds 1 10 []  - 0 Medium Wound Dressing one or multiple wounds []  - 0 Large Wound Dressing one or multiple wounds []  - 0 Application of Medications - topical []  - 0 Application of Medications - injection INTERVENTIONS - Miscellaneous []  - External ear exam 0 []  - 0 Specimen Collection (cultures, biopsies, blood, body fluids, etc.) []  - 0 Specimen(s) / Culture(s) sent or taken to Lab for analysis []  - 0 Patient Transfer (multiple staff / Civil Service fast streamer / Similar devices) []  - 0 Simple Staple / Suture removal (25 or less) []  - 0 Complex Staple / Suture removal (26 or more) []  - 0 Hypo / Hyperglycemic Management (close monitor of Blood Glucose) []  - 0 Ankle / Brachial Index (ABI) - do not check if billed separately X- 1 5 Vital Signs Has the patient been seen at the hospital within the last three years: Yes Total Score: 75 Level Of Care: New/Established - Level 2 Electronic Signature(s) Signed: 10/11/2022 12:25:01 PM By: Carlene Coria RN Entered By: Carlene Coria on 10/07/2022 15:29:12 Joerger, Ceriah CMarland Kitchen ([id number]) -------------------------------------------------------------------------------- Encounter Discharge Information Details Patient Name: Baswell, Saleha C. Date of Service: 10/07/2022 2:00 PM Medical Record Number: [id number] Patient Account Number: [id number] Date of Birth/Sex: Sep 02, 1961 (61 y.o. F) Treating RN: Carlene Coria Primary Care Damarien Nyman:  Clayborn Heron Other Clinician: Referring Lorece Keach: Card, John Treating Jennalee Greaves/Extender: Skipper Cliche in Treatment: 71 Encounter Discharge Information Items Discharge Condition: Stable Ambulatory Status: Walker Discharge Destination: Home Transportation: Private Auto Accompanied By: self Schedule Follow-up Appointment: Yes Clinical Summary of Care: Electronic Signature(s) Signed: 10/07/2022 3:30:39 PM By: Carlene Coria RN Entered By: Carlene Coria on 10/07/2022 15:30:39 Rieser, Tytiana C. ([id number]) -------------------------------------------------------------------------------- Lower Extremity Assessment Details Patient Name: Stoffel, Karma C. Date of Service: 10/07/2022 2:00 PM Medical Record Number: [id number] Patient Account Number: [id number] Date of Birth/Sex: 1961-08-25 (61 y.o. F) Treating RN: Carlene Coria Primary Care Aymar Whitfill: Clayborn Heron Other Clinician: Referring Hudson Lehmkuhl: Card, John Treating Sahib Pella/Extender: Skipper Cliche in Treatment: 51 Electronic Signature(s) Signed: 10/11/2022 12:25:01 PM By: Carlene Coria RN Entered By: Carlene Coria on 10/07/2022 14:21:11 Conaty, Mariame CMarland Kitchen ([id number]) -------------------------------------------------------------------------------- Multi Wound Chart Details Patient Name: Carte, Albert C. Date of Service: 10/07/2022 2:00 PM Medical Record Number: [id number] Patient Account Number: [id number] Date of Birth/Sex: [birth year] (61 y.o. F) Treating RN: Carlene Coria Primary Care Latravia Southgate: Clayborn Heron Other Clinician: Referring Elvera Almario: Card, John Treating Jame Seelig/Extender: Skipper Cliche in Treatment: 51 Vital Signs Height(in): 58 Pulse(bpm): 79 Weight(lbs): 275 Blood Pressure(mmHg): 158/90 Body Mass Index(BMI): 57.5 Temperature(F): 97.9 Respiratory Rate(breaths/min): 16 Wound Assessments Treatment Notes Electronic Signature(s) Signed: 10/11/2022 12:25:01 PM By: Carlene Coria RN Entered By: Carlene Coria on  10/07/2022 14:21:30 Bilyk, Kyriana Loletha Grayer ([id number]) -------------------------------------------------------------------------------- Hurley Details Patient Name: Fisk, Jamilette C. Date of Service: 10/07/2022 2:00 PM Medical Record Number: [id number] Patient Account Number: [id number] Date of Birth/Sex: [birth year] (61 y.o. F) Treating RN: Carlene Coria Primary Care Autumne Kallio: Clayborn Heron Other Clinician: Referring Dejanique Ruehl: Card, John Treating Myreon Wimer/Extender: Skipper Cliche in Treatment: 60 Active Inactive Wound/Skin Impairment Nursing Diagnoses: Knowledge deficit related to ulceration/compromised skin integrity Goals: Patient/caregiver will verbalize understanding of skin care regimen Date Initiated:  10/15/2021 Target Resolution Date: 11/16/2022 Goal Status: Active Ulcer/skin breakdown will have a volume reduction of 30% by week 4 Date Initiated: 10/15/2021 Date Inactivated: 01/29/2022 Target Resolution Date: 12/15/2021 Goal Status: Unmet Unmet Reason: comorbities Ulcer/skin breakdown will have a volume reduction of 50% by week 8 Date Initiated: 10/15/2021 Date Inactivated: 01/29/2022 Target Resolution Date: 01/15/2022 Goal Status: Unmet Unmet Reason: comorbities Ulcer/skin breakdown will have a volume reduction of 80% by week 12 Date Initiated: 10/15/2021 Date Inactivated: 02/19/2022 Target Resolution Date: 02/15/2022 Goal Status: Unmet Unmet Reason: comorbities Ulcer/skin breakdown will heal within 14 weeks Date Initiated: 10/15/2021 Date Inactivated: 05/07/2022 Target Resolution Date: 03/15/2022 Goal Status: Unmet Unmet Reason: comorbities Interventions: Assess patient/caregiver ability to obtain necessary supplies Assess patient/caregiver ability to perform ulcer/skin care regimen upon admission and as needed Assess ulceration(s) every visit Notes: Electronic Signature(s) Signed: 10/11/2022 12:25:01 PM By: Carlene Coria RN Entered By: Carlene Coria on 10/07/2022 Eagle, Beulah C. ([id number]) -------------------------------------------------------------------------------- Pain Assessment Details Patient Name: Panella, Aften C. Date of Service: 10/07/2022 2:00 PM Medical Record Number: [id number] Patient Account Number: [id number] Date of Birth/Sex: 13-Dec-1961 (61 y.o. F) Treating RN: Carlene Coria Primary Care Cletis Muma: Clayborn Heron Other Clinician: Referring Suvan Stcyr: Card, John Treating [name] Budzinski/Extender: Skipper Cliche in Treatment: 35 Active Problems Location of Pain Severity and Description of Pain Patient Has Paino Yes Site Locations With Dressing Change: Yes Duration of the Pain. Constant / Intermittento Intermittent How Long Does it Lasto Hours: Minutes: 15 Rate the pain. Current Pain Level: 4 Worst Pain Level: 6 Least Pain Level: 0 Tolerable Pain Level: 5 Character of Pain Describe the Pain: Burning Pain Management and Medication Current Pain Management: Medication: Yes Cold Application: No Rest: Yes Massage: No Activity: No T.E.N.S.: No Heat Application: No Leg drop or elevation: No Is the Current Pain Management Adequate: Inadequate How does your wound impact your activities of daily livingo Sleep: Yes Bathing: No Appetite: Yes Relationship With Others: No Bladder Continence: No Emotions: No Bowel Continence: No Work: No Toileting: No Drive: No Dressing: No Hobbies: No Electronic Signature(s) Signed: 10/11/2022 12:25:01 PM By: Carlene Coria RN Entered By: Carlene Coria on 10/07/2022 14:20:51 Amon, Dynisha Loletha Grayer ([id number]) -------------------------------------------------------------------------------- Patient/Caregiver Education Details Patient Name: Habel, Britania C. Date of Service: 10/07/2022 2:00 PM Medical Record Number: [id number] Patient Account Number: [id number] Date of Birth/Gender: 03-Jan-1961 (61 y.o. F) Treating RN: Carlene Coria Primary Care Physician: Clayborn Heron Other Clinician: Referring Physician: Card, John Treating Physician/Extender: Skipper Cliche in Treatment: 32 Education Assessment Education Provided To: Patient Education Topics Provided Wound/Skin Impairment: Methods: Explain/Verbal Responses: State content correctly Electronic Signature(s) Signed: 10/11/2022 12:25:01 PM By: Carlene Coria RN Entered By: Carlene Coria on 10/07/2022 15:29:33 Stick, Joshua C. ([id number]) -------------------------------------------------------------------------------- Wound Assessment Details Patient Name: Livas, Torrence C. Date of Service: 10/07/2022 2:00 PM Medical Record Number: [id number] Patient Account Number: [id number] Date of Birth/Sex: October 11, 1961 (61 y.o. F) Treating RN: Carlene Coria Primary Care Artem Bunte: Card, Jenny Reichmann Other Clinician: Referring Mc Hollen: Card, John Treating Shanikia [employer]/Extender: Skipper Cliche in Treatment: 51 Wound Status Wound Number: 1 Primary Etiology: Dehisced Wound Wound Location: Distal, Midline Back Wound Status: Open Wounding Event: Surgical Injury Comorbid History: Hypertension Date Acquired: 04/11/2021 Weeks Of Treatment: 51 Clustered Wound: No Photos Wound Measurements Length: (cm) 0.3 Width: (cm) 0.3 Depth: (cm) 2.5 Area: (cm) 0.071 Volume: (cm) 0.177 % Reduction in Area: 43.7% % Reduction in Volume: 69.4% Epithelialization: None Tunneling: No Undermining: Yes Starting Position (o'clock): 9 Ending Position (o'clock): 3 Maximum Distance: (cm) 3 Wound Description Classification:  Full Thickness Without Exposed Support Structu Exudate Amount: Medium Exudate Type: Serosanguineous Exudate Color: red, brown res Foul Odor After Cleansing: No Slough/Fibrino No Wound Bed Granulation Amount: Large (67-100%) Exposed Structure Granulation Quality: Red Fascia Exposed: No Necrotic Amount: None Present (0%) Fat Layer (Subcutaneous Tissue) Exposed: Yes Tendon Exposed: No Muscle Exposed:  No Joint Exposed: No Bone Exposed: No Treatment Notes Wound #1 (Back) Wound Laterality: Midline, Distal Cleanser Normal Saline Mcgourty, Colby C. ([id number]) Discharge Instruction: Wash your hands with soap and water. Remove old dressing, discard into plastic bag and place into trash. Cleanse the wound with Normal Saline prior to applying a clean dressing using gauze sponges, not tissues or cotton balls. Do not scrub or use excessive force. Pat dry using gauze sponges, not tissue or cotton balls. Peri-Wound Care Topical Primary Dressing Hydrofera Blue Rope Discharge Instruction: cut into fourths; angle the end Secondary Dressing (BORDER) Zetuvit Plus SILICONE BORDER Dressing 4x4 (in/in) Secured With Compression Wrap Compression Stockings Add-Ons Electronic Signature(s) Signed: 10/07/2022 2:59:07 PM By: Carlene Coria RN Entered By: Carlene Coria on 10/07/2022 14:59:07 Miedema, Shannelle C. ([id number]) -------------------------------------------------------------------------------- Vitals Details Patient Name: Speas, Vernee C. Date of Service: 10/07/2022 2:00 PM Medical Record Number: [id number] Patient Account Number: [id number] Date of Birth/Sex: [birth year] (61 y.o. F) Treating RN: Carlene Coria Primary Care Aradhana Gin: Clayborn Heron Other Clinician: Referring Patrece Tallie: Card, John Treating Camauri Fleece/Extender: Skipper Cliche in Treatment: 102 Vital Signs Time Taken: 14:10 Temperature (F): 97.9 Height (in): 58 Pulse (bpm): 79 Weight (lbs): 275 Respiratory Rate (breaths/min): 16 Body Mass Index (BMI): 57.5 Blood Pressure (mmHg): 158/90 Reference Range: 80 - 120 mg / dl Electronic Signature(s) Signed: 10/11/2022 12:25:01 PM By: Carlene Coria RN Entered By: Carlene Coria on 10/07/2022 14:20:06

## 2022-10-14 ENCOUNTER — Encounter: Payer: 59 | Admitting: Physician Assistant

## 2022-10-14 DIAGNOSIS — T8131XA Disruption of external operation (surgical) wound, not elsewhere classified, initial encounter: Secondary | ICD-10-CM | POA: Diagnosis not present

## 2022-10-14 NOTE — Progress Notes (Addendum)
[name], [name] ([id number]) [id number] Page 1 of 11 Visit Report for 10/14/2022 Chief Complaint Document Details Patient Name: Date of Service: EV A NS, HA RRIETTE C. 10/14/2022 2:00 PM Medical Record Number: [id number] Patient Account Number: [id number] Date of Birth/Sex: Treating RN: [birth year] (61 y.o. Freddy Finner Primary Care Provider: [name] Other Clinician: Referring Provider: Treating Provider/Extender: Vickii Chafe in Treatment: 63 Information Obtained from: Patient Chief Complaint Surgical Back Ulcer Electronic Signature(s) Signed: 10/14/2022 2:00:58 PM By: [name] PA-C Entered By: [name] on 10/14/2022 14:00:58 -------------------------------------------------------------------------------- HPI Details Patient Name: Date of Service: EV A NS, HA RRIETTE C. 10/14/2022 2:00 PM Medical Record Number: [id number] Patient Account Number: [id number] Date of Birth/Sex: Treating RN: 1961/04/28 (61 y.o. 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 This was actually broken up into 3 separate surgical events. The initial surgical intervention actually was on November 05, 2021 almost a year ago. Subsequently the patient went back in February for a seroma of the area which unfortunately required her to have a repeat surgery to go in and clean this out. And then again this occurred in April where she went back in and again they felt like stitches were coming out and there was an additional seroma. She was placed in a wound VAC initially and then  subsequently as it got smaller that was discontinued. Again right now I will see anything that I think a wound VAC would help with. Nonetheless she definitely has a significant depth to the wound that is going require packing. I actually believe the Hydrofera Blue rope would probably do quite well with this the problem is as much as it is draining she probably needs this to be changed at least every day. She does not really have anyone that can help with that that is the complicating scenario here. With that being said the patient does have a history of multiple sclerosis, hypertension, and again this surgical wound dehiscence in regard to her lumbar spine region. She did have a repeat MRI which was actually completed 10/09/2021. This showed that there was no significant change in the subcutaneous fluid collection/track of the lower lumbar region. This is extending to the level of the fascia unfortunately. This seems to go all the way from the L2 level with a track extending all the way to the fascia at the L4-5 level. Again this is a significant wound and there is significant drainage but does not seem to communicate to the spinal region as far as spinal fluid or otherwise is concerned that is good news. Nonetheless she last saw Dr. Franky Macho who is her neurosurgeon on 10/01/2021 that was when he ordered this last MRI she supposed to see him next week as well. With that being said he did not feel like there was any significant issue there but was not sure why this was not healing that is when he ordered the MRI. They were wanting to make sure that this was packed appropriately by home health unfortunately the main issue currently is that home health is completely out of the picture as the patient has exhausted all the home health that that she gets for a year.  She is now in a very difficult predicament where she does not have anyone to help her change the dressing and to be honest that she is not able  to [name], [name] ([id number]) [id number] Page 2 of 11 do it herself with the location of the wound being on the midline lumbar spine region. If she does not have anyone that can help it is probably can to be necessary for her to go to a facility for rehab and daily dressing changes as I feel like daily changes which is much drainage that she is having is going to be necessary. 10/23/2021 upon evaluation today patient appears to be doing decently well in regard to her back ulcer. This does seem to be draining a lot less than what it is been doing in the past. With that being said she still has quite a bit of drainage nonetheless. I do think that given time this should improve least I hope so. The good news is she does have home health coming out 3 days a week were doing it 2 days a week and she is paying someone we can to help. 10/30/2021 upon evaluation today patient appears to be doing okay in regard to her back ulcer this is not draining quite as bad as it was in the beginning but he still has quite a bit of drainage noted. I do believe that the patient would benefit from [place] going ahead forward with attempting a wound VAC using the Hydrofera Blue rope to pack with and then subsequently using the VAC externally to actually suction out and help this to fill-in. I think this is our ideal way to try to get things cleared at this point. As it stands I am not certain that we are really making a progress that we want to see near with doing it in the way we are which is packing with the rope. It is a good dressing but I do think it is insufficient for total healing. She just seems to have too much in the way of drainage at this point unfortunately. 11/06/2021 upon evaluation today patient unfortunately continues to have issues with her back ongoing. The good news is her MRI that was repeated showed signs of the size of this area in the lumbar spine region having decreased from  4 cm to 3.5 cm this is definitely not bad news at all. With that being said unfortunately she continues to have issues with ongoing drainage not as severe as in the beginning but nonetheless still significant. I do think a wound VAC still would be a good way to go although her home health agency nurse apparently has some concerns about the possibility of not being able to keep a seal with this as they had struggles in the past. Nonetheless I explained to the patient that this is much different than what she had previous and that I really feel like it would do much better as far as getting the area taken care of without having any complications or issues here. I think that we should be able to maintain a seal. Nonetheless at this time I did discuss with the patient as well that she probably does need to have a wound VAC in order for [place] to get this moving in the right direction. 11/13/2021 upon evaluation patient's wound bed actually showed signs of significant drainage at this time. She did see the surgeon yesterday he did not see anything that appeared to be infected. Nonetheless he  does appear that she is continuing to have areas here that just do not seem to want to seal up there MRI findings have been negative but nonetheless she continues to have is the seroma that is filling in. I do feel like we need to try to widen the hole so we can get at least a half of the [facility] then this will be better than nothing at this point. 11/20/2021 upon evaluation today patient actually appears to be having less pain at this point which is good news and overall she we still do not have the results of the culture back yet it had to be sent out to [facility] and we do not have the result back yet. Is doing decently well in regard to her wound. Fortunately there does not appear to be any signs of active infection systemically nor locally at this time. 11/27/2021 upon evaluation today patient appears to be doing  well with regard to her wound all things considered there does appear to be less drainage than there was previous. Fortunately I do not see any evidence of worsening of the patient is stating that she is having some issues with back pain. This is somewhat new. Again this I think could be related to the fact that she is having some issues here with infection. We are still waiting to see what the result of her culture shows from susceptibility testing Enterococcus has been identified but we do not know if this is VRE or not. 12/04/2021 upon evaluation today patient appears to be doing well with regard to her wound all things considered. I did have a conversation with Erin from Dr. Sueanne Margarita office. Of note she notes that Dr. Drinda Butts really does not want to do anything surgical right now which I completely understand. With that being said I am still leery of how far we will make it getting this to heal short of any type of surgery to open this up and allow [place] to more appropriately packed the wound. Nonetheless I will absolutely give it our best shot as far as that is concerned. I discussed that with the patient today. She voiced understanding. The good news is the drainage today seems to be clear it is no longer cloudy as it was previous I am actually very pleased in that regard. 12/11/2021 since have last seen the patient actually did have an conversation with Dr. Franky Macho with her neurosurgeon. Again this involve the discussion around whether or not to open the wound and try to apply a wound VAC following. With that being said the decision was made that that may be the best thing to do if the patient was in agreement. Nonetheless I am actually extremely encouraged with what I am seeing today much more than I would have thought. In fact I think that we may be over packing the wound which is why she is having some discomfort at this point as there is much less of the dressing able to get and this time  compared to what we saw previous. That is actually really good news. In fact the 6:00 tunnel appears to have filled in is awesome news. At the 12:00 location I am actually able to pack into that area pretty effectively at this point today. In fact I was able to get less of the packing in which I will detail below. 12/18/2021 upon evaluation today patient appears to be doing well with regard to her wound on the back. Fortunately there is  no signs of active infection which is great news and overall very pleased with where things stand today. No fevers, chills, nausea, vomiting, or diarrhea. 01/01/2022 upon evaluation today patient appears to be doing decently well in regard to her wound. Fortunately there does not appear to be any signs of anything worsening which is great news. She still continues to have issues with drainage but this is not nearly as significant as what it was in the past. There is all clear drainage no signs of purulence noted. 01/08/2022 upon evaluation today patient appears to be doing well with regard to her wound. With that being said she tells me that she has been having some increased pain over the past week. It does appear based on the amount of Hydrofera Blue that we removed this is probably over pack. Again it is difficult to know exactly how old the nurses getting all the skin but nonetheless the length of Hydrofera Blue that was utilized was way too much which may be part of the reason why this felt so uncomfortable. Fortunately I think that we can cut back on that we discussed pieces smaller so hopefully that will not be over packed. 01/22/2021 upon evaluation today patient appears to be doing well With regard to there being no signs of infection at this time. The wound does still tunnel mainly up at the 12:00 location. The depth of the tunnel complete from entry point to the base is about 3.5 cm today. With that being said I do believe that overall she is making good progress  this is just a very slow process for her which I know has been frustrating as well. 01/29/2022 upon evaluation today patient appears to be doing well with regard to the wound on her lower back and the lumbar spine region. The good news is the depth that I got this week was a little bit less going up at the 12:00 location as compared to last week. I measured this to be 3.5 cm last week and it was 3.3 cm this week. Again that is a minute shift but nonetheless a good shift in the right direction. Overall I think that we are on the right track. 02/05/2022 upon evaluation today patient appears to be doing well with regard to her wound. In fact right now her measurements are somewhere around 2.9 cm which is definitely smaller and less deep than last week At the 12:00 location. Overall I am very pleased with what we are seeing. 02/19/2022 upon evaluation today patient unfortunately is noting that the wound is actually closing up externally but internally there is still some depth to this. I discussed with her today that we cannot let it close up like this is can end up being a significant issue for her if we allow for that. Subsequently we need to do what we can to try to open this up and keep it open more effectively. She voiced understanding. With that being said we are going to proceed with that procedure today in order to debride away some of the skin on externally that is trying to close and on this. 02/26/2022 upon evaluation patient appears to be doing better in my opinion after we had to open up this area last week. Again she has a significant amount of healing and overall I am extremely pleased with where things stand currently. I do not see any evidence of active infection locally nor systemically at this time which is great news and in general  I think that we are on the right track for getting this hopefully closed final and done. 03/05/2022 upon evaluation today patient unfortunately is doing a little bit  worse with regard to the whole size this is starting to get much smaller unfortunately. There does not appear to be any signs of active infection locally nor systemically which is great news. With that being said I am concerned about the fact that the patient is doing quite a bit worse with regard to trapping some fluid and almost feels like she may have a fluid pocket that not only goes in and up towards 12:00 but looks back around towards the more superficial subcutaneous tissue. I am very concerned that this is going to be something that is very hard for [place] to heal short of another surgery to open this up and try to wound VAC from the inside out. Even then there is no guarantees this is just a very difficult situation to be perfectly honest. 03/14/2022 upon evaluation today patient appears to be doing well with regard to her wound as far as the area staying open and pleased in that regard. With that being said unfortunately she is not doing as well when it comes to the irritation around it appears to be very inflamed and red this is not good that was ZAILYN, THOENNES ([id number]) [id number] Page 3 of 11 discussed with her today as well. Subsequently I do believe that working to need to do what we can do to try and calm this down in the past she did well with the Augmentin due to Enterococcus infection I am not sure if were dealing with the same thing or not but at the same time I think that is probably to be my go to at this point. 03/19/2022 upon evaluation today patient appears to be doing really about the same in regard to her wound that the pain is better. 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 She seems to be improved dramatically with the antibiotic currently although it does not cover for MRSA I am apt to just see how this progresses over the next several days to a week. With that being said the bigger issue here is that we simply do  not seem to be making excellent progress and I am concerned about the lack of progress. I discussed with Dr. Shon Baton with [facility] in [place] and to be honest his recommendation was that the best bet would probably be to get her to a teaching center. She is actually been seen for rheumatology and a I believe psychologist/psychiatrist at [facility]. She would prefer to go that direction as opposed to [facility] or [facility]. I am definitely okay with that. 03/26/2022 upon evaluation today patient appears to be doing okay in regard to her wound. She is not showing signs of worsening and overall she tells me that her pain is not nearly as bad as it was previous. Fortunately I do not see any evidence of active infection locally or systemically which is great news. No fevers, chills, nausea, vomiting, or diarrhea. 04-02-2022 upon evaluation today patient appears to be doing well with regard to her wound. It does not appear to be showing any signs of worsening which is great news. With that being said you are still having some issues here with drainage although it is clear and mainly just tenderness with bleeding from the [facility] I do think that treating for the second issue which is  MRSA is probably the right thing to do at this point she is now off of the Augmentin. 04-09-2022 upon evaluation today patient's wound actually appears to be doing about the same. Fortunately I do not see any evidence of active infection locally or systemically at this time which is great news. No fevers, chills, nausea, vomiting, or diarrhea. 04-16-2022 upon evaluation today patient appears to be doing well with regard to her wound in the lumbar spine region. Subsequently she continues to have an area that does have a tunnel up into the 12:00 location. This is about 3.5 cm using a skinny probe curved upwards to get into this region. With that being said I really do not see any signs of overall improvement but also do not see any signs  of worsening I feel like work on about a still made here to some degree. The patient did see her neurologist and he has referred her to neurosurgery at [facility] for second opinion we will see what they have to say as well. 04-23-2022 upon evaluation today patient appears to be doing well with regard to her wound all things considered. She has been tolerating the dressing changes without complication. Fortunately I do not see any signs of active infection locally nor systemically which is great news. No fever chills noted 04-30-2022 upon evaluation today patient appears to be doing about the same in regard to her wound. The depth is really not dramatically improved as far as the 12:00 tunnel is concerned. The overall depth and the base of the wound does seem to be a little bit better it does sound like that external wound has closed as far as the opening is concerned we can have to cut this down from a half to a smaller size based on what we are seeing today. 05-07-2022 upon evaluation today patient's wound actually seems to be doing decently well. There is not a major shift here but a slight shift in the depth both straight and as well as in the [place] at 12:00. With that being said I again we will talk about a couple of millimeters but still every little bit can count when there are situations like this to be honest. 05-14-2022 upon evaluation today patient appears to be doing okay in regard to her wound. I do not see any signs of anything worsening. With that being said also do not see getting significantly better unfortunately. There does not appear to be any evidence of infection locally or systemically which is great news. No fevers, chills, nausea, vomiting, or diarrhea. 05-21-2022 upon evaluation today patient actually appears to be doing quite well in regard to her wound from the standpoint of there being no infection. With that being said there does not appear to be any evidence of infection locally  nor systemically which is great news. No fevers, chills, nausea, vomiting, or diarrhea. 05-28-2022 upon evaluation today patient's wound actually appears to be doing about the same. I do not see any evidence of active infection locally or systemically which is great news. No fevers, chills, nausea, vomiting, or diarrhea. 06-04-2022 upon evaluation today patient appears to be doing well with regard to her wound. She has been tolerating dressing changes without complication. Fortunately I do not feel like there is any worsening also I do not feel like there is any improvement. I feel like right a solid area here where she has a tunnel that tracks up at 12:00 and then has a fluid pocket at around roughly  1:00 I can push on this and squeeze fluid out. Again I am not exactly sure what else can be done from my standpoint to improve this. She does have an appointment with Dr. Franky Macho who I do believe is an excellent surgeon and this is the surgeon who performed this surgery initially. Again the biggest issue was following she had a lot of trouble with the wound VAC I do think that we need to try to see if she is can have a wound VAC following surgery what exactly regular need to do to help keep this moving in the right direction for her. Obviously I want her to have the best result possible that she has to go through surgery again to clean this area out. Absolutely willing to help out with getting this to heal. 06-13-2022 upon evaluation today patient appears to be doing well with regard to her wound its not any worse unfortunately it is also not significantly better which is the only issue currently. I do not see any signs of active infection locally or systemically which is great news. No fevers, chills, nausea, vomiting, or diarrhea. She does have her appointment on Monday with her neurosurgeon and we will see you Dr. Franky Macho has to say at that point. 06-18-2022 upon evaluation patient appears to be doing a  little worse in regard to her wound only in the respect that has been several days since she has had this changed in fact it was last changed on Friday. Since that time she tells me that she has kept this in place over the weekend and yesterday she was supposed to see her neurosurgeon yesterday but they had to cancel due to an emergency surgery according to what the patient tells me. Nonetheless she is having some issues here with drainage coming from the wound that is more purulent in nature I think this is something we have been seeing in the [facility] for several weeks but is more obvious being that it was not changed as much during the last week. She unfortunately is a little worried about this but again I think that this is just part of what we have been dealing with all long is more prevalent and obvious due to the length of time since it was changed last. She does have a repeat appointment for I believe July 10 she told me although she is going to see if she can find anything sooner. 06-25-2022 upon evaluation today patient's wound actually showed signs of marginal improvement which is good news. She still has the area of abscess that seems to be at roughly 2:00 when looking at her back. We can get into this with the skinny probe other than that I really do not know what else I can do to try to make this better. We discussed this and she does have an appointment with her neurosurgeon although that is not till mid July as he had to change it at the last moment during the time she was post to see him a week ago. She does have evidence of potential infection on culture I am going to go ahead and treat this in order to ensure that nothing worsens. With that being said I do believe that overall she seems to be doing well but I do think we want to make sure that that the knee gets worse in the meantime until she gets to see her neurosurgeon. 07-01-2022 upon evaluation today patient appears to  continue to have trouble  with her wound draining. Again we have been continue to monitor this and I still see the same issue that I have noted before the patient has an area which is draining seemingly from around the 2:00 location when you go up towards 12:00 and then over towards 2:00 looking at her back this is where there is actually a soft area external that if you push on it you will see fluid come out and subsequently this is also where it tracks to internally. Everything in the 6:00 location has completely healed in and this looks much better in that regard but this upper portion we just cannot get to closed with the methods that we have. It seemed to me that this is probably going to require that this area be open and cleaned out in order to allow it to heal and my suggestion would be that a wound VAC is probably going to be the ideal thing. 07-09-2022 upon evaluation today patient appears to be doing somewhat poorly in regard to her wound. She did see Dr. Franky Macho. He stated that the wound was not being "packed appropriately" according to the patient and her daughter who were present during the evaluation today as well as the evaluation with him yesterday. With that being said he packed the wound the way he said it needed to be and to be partly honest that is over packed. The patient is having a tremendous amount of discomfort and states that this is no way that she is good to be able to tolerate that. I understand the concern about the whole closing down which is why I recommended trying to keep this open is much as possible and again with that all being said this is still going to continue to be an issue as far as this closing down before everything heals up on the inside. Nonetheless she continues to have issues with significant drainage. She also has an area which almost feels like a small abscess or least of fluid pocket that feels up that was not appropriately packed with the dressings that  were in place. With that being said I think that this is going to have to be opened at some point in time in order to get this to heal I am really not certain the best way to go about this. I discussed that with the patient again today. [name], [name] ([id number]) [id number] Page 4 of 11 07-16-2022 upon evaluation today patient appears to be doing well currently in regard to her wound from the standpoint of the pain getting better which is great news. Fortunately I do not see any evidence of active infection locally or systemically which is great news. No fevers, chills, nausea, vomiting, or diarrhea. With that being said the patient has been tolerating the dressing changes better without complication which is great news. 07-29-2022 upon evaluation today patient appears to be doing actually pretty well in regard to her wound. Fortunately there does not appear to be any signs of infection. She still has drainage and we still are seeing an area from this pocket up at 12:00 that is leaking but fortunately this does not appear to be having any significant issues with infection right now which is great news. Fortunately her pain is also calm down since I saw her 2 weeks ago. 08-05-2022 upon evaluation today patient appears to be doing unfortunately a little worse in regard to the discomfort she is feeling she tells me that this is bothering her pretty much  all the time at a low level. It never seems to go away. This is since last week. With that being said the overall depth is unfortunately a little bit deeper this week which is definitely not what I was hoping to see. I did get to review the second opinion that was requested by the patient from her insurance to have an independent physician review the situation here. The short story of that reviewed which I did read through the entirety of she states that the patient does likely need surgery to correct the seroma considering  length of time this has been going on. It was recommended that a plastic surgeon be the one to have this up in case there is a muscle flap necessary but at minimum that this area needs to be cleaned out and then appropriately close following. The patient has also read through this. She does seem to be a little bit off of her normal game not feeling quite that well today. She is not exactly sure what is going on. 08-12-2022 upon evaluation today patient's wound is actually showing signs of maintaining stability which is good news. Fortunately there does not appear to be any evidence of active infection locally or systemically at this time which is excellent. With that being said she does have a meeting with her nurse with [facility] on Thursday to discuss options for plastic surgery where she is able to go. 08-19-2022 upon evaluation today patient appears to be doing well currently in regard to her wound all things considered. Unfortunately the patient is still continuing to have some issues here with an ongoing open wound in the back despite everything that we have tried she has had an independent review which recommended surgical intervention. Where the process of trying to find someone to take her on as a patient. The suggestion was for plastic surgery which I think is probably a good option here. With that being said she has been looking through her insurance to see who was in network for her and has found someone that I think will be very good. Working to see about making that referral to them. 08-26-2022 upon evaluation today patient appears to be doing well currently in regard to her wound. She tells me she is having some discomfort fortunately there does not appear to be any evidence of active infection at this time which is great news. We have actually gotten her an appointment finally with the plastic surgeon that is 4 I believe it is September 26. 09-03-2022 upon evaluation today patient  appears to be doing well currently in regard to her wound everything appears to be stable which is good in that regard. With that being said the drainage does appear to be a little more thick compared to normal she has been having a lot of irritation as well. I do believe that it may be at the point that the repeating the Augmentin that she is previously done well with in the past could be of benefit for her here currently. The patient voiced understanding she has not had this since September 27 the last time I prescribed this for her. Nonetheless it did do a very good job to calm things down quite a bit back at that time. 09-09-2022 upon evaluation today patient appears to be doing well currently in regard to her wound she is stable. There does not appear to be anything worsening at this point. Fortunately I see no signs of active infection at this  time. No fevers, chills, nausea, vomiting, or diarrhea. 09-16-2022 upon evaluation today patient appears to be doing well currently regard to her wound there does not appear to be any signs of active infection locally or systemically at this time which is great news. No fevers, chills, nausea, vomiting, or diarrhea. 09-23-2022 upon evaluation today patient appears to be doing well currently in regard to her wound all things considered this is still stable although not significantly better. There is still some purulent drainage noted again she has been on antibiotics this continues to be of concern. Fortunately there does not appear to be any evidence of active infection locally or systemically at this time which is great news. 09-30-2022 upon evaluation today patient appears to be doing very well in regard to her wound and things are stable. She also did see the surgeon, Dr. Ferd Hibbs at [facility] in [place]. She actually has a plan to go forward with performing surgery in order to close this wound which I think is definitely going to be the right thing to  do. Fortunately I do not see any signs of active infection locally or systemically which is great news and my hope is they will be able to surgically get this closed so that she can move on from this aspect of her life where she has been really struggling to get back to more normal situation. 10-07-2022 upon evaluation today patient appears to be doing well currently in regard to her wound. This is showing signs of continued stability I do not see any signs of anything worsening which is great news and overall made it very pleased in that regard. No fevers, chills, nausea, vomiting, or diarrhea. 10-14-2022 upon evaluation today patient appears to be doing well currently in regard to her wound she does not appear to be showing any signs of infection which is great news. No fevers, chills, nausea, vomiting, or diarrhea. Of note she does have her appointment with the surgeon for the actual surgery this coming Friday which is great news. Were hopeful that this is going to take care of the situation she may not even need the wound VAC but they have everything approved for any eventuality [place] wound VAC or otherwise. Electronic Signature(s) Signed: 10/14/2022 11:30:29 AM By: [name] PA-C Entered By: [name] on 10/14/2022 14:30:28 -------------------------------------------------------------------------------- Physical Exam Details Patient Name: Date of Service: EV A NS, HA RRIETTE C. 10/14/2022 2:00 PM Medical Record Number: [id number] Patient Account Number: [id number] Date of Birth/Sex: Treating RN: 05-11-1961 (61 y.o. Freddy Finner Primary Care Provider: [name] Other Clinician: Referring Provider: Treating Provider/Extender: Vickii Chafe in Treatment: [address], [place] ([id number]) [id number] Page 5 of 11 Constitutional Well-nourished and well-hydrated in no acute distress. Respiratory normal breathing without  difficulty. Psychiatric this patient is able to make decisions and demonstrates good insight into disease process. Alert and Oriented x 3. pleasant and cooperative. Notes Upon inspection patient's wound bed again appears to be about the same as far as depth and undermining there is no significant difference here at this point she still has the tracking up to toward 12:00. Electronic Signature(s) Signed: 10/14/2022 11:30:48 AM By: [name] PA-C Entered By: [name] on 10/14/2022 14:30:48 -------------------------------------------------------------------------------- Physician Orders Details Patient Name: Date of Service: EV A NS, HA RRIETTE C. 10/14/2022 2:00 PM Medical Record Number: [id number] Patient Account Number: [id number] Date of Birth/Sex: Treating RN: Apr 28, 1961 (61 y.o. 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 Nurse Visit as needed Home Health Home Health Company: - [facility] for wound care. May utilize formulary equivalent dressing for wound treatment orders unless otherwise specified. Home Health Nurse may visit PR[facility] to address patients wound care needs. - Monday and Friday [facility] fax [phone] Bathing/ Shower/ Hygiene May shower; gently cleanse wound with antibacterial soap, rinse and pat dry prior to dressing wounds No tub bath. Anesthetic (Use 'Patient Medications' Section for Anesthetic Order Entry) Lidocaine applied to wound bed Edema Control - Lymphedema /  Segmental Compressive Device / Other Elevate, Exercise Daily and A void Standing for Long Periods of Time. Elevate legs to the level of the heart and pump ankles as often as possible Elevate leg(s) parallel to the floor when sitting. Additional Orders / Instructions [name], [name] ([id number]) [id number] Page 6 of 11 Follow Nutritious Diet and Increase Protein Intake Wound Treatment Wound #1 - Back Wound Laterality: Midline, Distal Cleanser: Normal Saline 3 x Per Week/30 Days Discharge Instructions: Wash your hands with soap and water. Remove old dressing, discard into plastic bag and place into trash. Cleanse the wound with Normal Saline prior to applying a clean dressing using gauze sponges, not tissues or cotton balls. Do not scrub or use excessive force. Pat dry using gauze sponges, not tissue or cotton balls. Prim Dressing: Hydrofera Blue Rope 3 x Per Week/30 Days ary Discharge Instructions: cut into fourths; angle the end Secondary Dressing: (BORDER) Zetuvit Plus SILICONE BORDER Dressing 4x4 (in/in) 3 x Per Week/30 Days Electronic Signature(s) Signed: 10/14/2022 11:34:11 AM By: Yevonne Pax RN Signed: 10/14/2022 2:02:33 PM By: [name] PA-C Entered By: Yevonne Pax on 10/14/2022 14:34:10 -------------------------------------------------------------------------------- Problem List Details Patient Name: Date of Service: EV A NS, HA RRIETTE C. 10/14/2022 2:00 PM Medical Record Number: [id number] Patient Account Number: [id number] Date of Birth/Sex: Treating RN: June 23, 1961 (61 y.o. 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 10/14/2022 2:00 PM Medical Record Number: [id number] Patient Account Number: [id number] Date of Birth/Sex: Treating RN: September 09, 1961 (61 y.o. 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 This was actually broken up into 3 separate surgical events. The initial  surgical intervention actually was on November 05, 2021 almost a year ago. Subsequently the patient went back in February for a seroma of the area which unfortunately required her to have a repeat surgery to go in and clean this out. And then again this occurred in April where she went back in and again they felt like stitches were coming out and there was an additional seroma. She was placed in a wound VAC initially and then subsequently as it got smaller that was discontinued. Again right now I will see anything that I think a wound VAC would help with. Nonetheless she definitely has a significant depth to the  wound that is going require packing. I actually believe the Hydrofera Blue rope would probably do quite well with this the problem is as much as it is draining she probably needs this to be changed at least every day. She does not really have anyone that can help with that that is the complicating scenario here. With that being said the patient does have a history of multiple sclerosis, hypertension, and again this surgical wound dehiscence in regard to her lumbar spine region. She did have a repeat MRI which was actually completed 10/09/2021. This showed that there was no significant change in the subcutaneous fluid collection/track of the lower lumbar region. This is extending to the level of the fascia unfortunately. This seems to go all the way from the L2 level with a track extending all the way to the fascia at the L4-5 level. Again this is a significant wound and there is significant drainage but does not seem to communicate to the spinal region as far as spinal fluid or otherwise is concerned that is good news. Nonetheless she last saw Dr. Franky Macho who is her neurosurgeon on 10/01/2021 that was when he ordered this last MRI she supposed to see him next week as well. With that being said he did not feel like there was any significant issue there but was not sure why this was not healing that is when he ordered the MRI. They were wanting to make sure that this was packed appropriately by home health unfortunately the main issue currently is that home health is completely out of the picture as the patient has exhausted all the home health that that she gets for a year. She is now in a very difficult predicament where she does not have anyone to help her change the dressing and to be honest that she is not able to do it herself with the location of the wound being on the midline lumbar spine region. If she does not have anyone that can help it is probably can to be necessary for her to go to  a facility for rehab and daily dressing changes as I feel like daily changes which is much drainage that she is having is going to be necessary. 10/23/2021 upon evaluation today patient appears to be doing decently well in regard to her back ulcer. This does seem to be draining a lot less than what it is been doing in the past. With that being said she still has quite a bit of drainage nonetheless. I do think that given time this should improve least I hope so. The good news is she does have home health coming out 3 days a week were doing it 2 days a week and she is paying someone we can to help. 10/30/2021 upon evaluation today patient appears to be doing okay in regard to her  back ulcer this is not draining quite as bad as it was in the beginning but he still has quite a bit of drainage noted. I do believe that the patient would benefit from [place] going ahead forward with attempting a wound VAC using the Hydrofera Blue rope to pack with and then subsequently using the VAC externally to actually suction out and help this to fill-in. I think this is our ideal way to try to get things cleared at this point. As it stands I am not certain that we are really making a progress that we want to see near with doing it in the way we are which is packing with the rope. It is a good dressing but I do think it is insufficient for total healing. She just seems to have too much in the way of drainage at this point unfortunately. 11/06/2021 upon evaluation today patient unfortunately continues to have issues with her back ongoing. The good news is her MRI that was repeated showed signs of the size of this area in the lumbar spine region having decreased from 4 cm to 3.5 cm this is definitely not bad news at all. With that being said unfortunately she continues to have issues with ongoing drainage not as severe as in the beginning but nonetheless still significant. I do think a wound VAC still would be a good way to go  although her home health agency nurse apparently has some concerns about the possibility of not being able to keep a seal with this as they had struggles in the past. Nonetheless I explained to the patient that this is much different than what she had previous and that I really feel like it would do much better as far as getting the area taken care of without having any complications or issues here. I think that we should be able to maintain a seal. Nonetheless at this time I did discuss with the patient as well that she probably does need to have a wound VAC in order for [place] to get this moving in the right direction. 11/13/2021 upon evaluation patient's wound bed actually showed signs of significant drainage at this time. She did see the surgeon yesterday he did not see anything that appeared to be infected. Nonetheless he does appear that she is continuing to have areas here that just do not seem to want to seal up there MRI findings have been negative but nonetheless she continues to have is the seroma that is filling in. I do feel like we need to try to widen the hole so we can get at least a half of the [facility] then this will be better than nothing at this point. 11/20/2021 upon evaluation today patient actually appears to be having less pain at this point which is good news and overall she we still do not have the results of the culture back yet it had to be sent out to [facility] and we do not have the result back yet. Is doing decently well in regard to her wound. Fortunately there does not appear to be any signs of active infection systemically nor locally at this time. 11/27/2021 upon evaluation today patient appears to be doing well with regard to her wound all things considered there does appear to be less drainage than there was previous. Fortunately I do not see any evidence of worsening of the patient is stating that she is having some issues with back pain. This is somewhat new. Again  this  I think could be related to the fact that she is having some issues here with infection. We are still waiting to see what the result of her culture shows from susceptibility testing Enterococcus has been identified but we do not know if this is VRE or not. 12/04/2021 upon evaluation today patient appears to be doing well with regard to her wound all things considered. I did have a conversation with Erin from Dr. Sueanne Margarita office. Of note she notes that Dr. Drinda Butts really does not want to do anything surgical right now which I completely understand. With that being said I [name], [name] ([id number]) [id number] Page 8 of 11 am still leery of how far we will make it getting this to heal short of any type of surgery to open this up and allow [place] to more appropriately packed the wound. Nonetheless I will absolutely give it our best shot as far as that is concerned. I discussed that with the patient today. She voiced understanding. The good news is the drainage today seems to be clear it is no longer cloudy as it was previous I am actually very pleased in that regard. 12/11/2021 since have last seen the patient actually did have an conversation with Dr. Franky Macho with her neurosurgeon. Again this involve the discussion around whether or not to open the wound and try to apply a wound VAC following. With that being said the decision was made that that may be the best thing to do if the patient was in agreement. Nonetheless I am actually extremely encouraged with what I am seeing today much more than I would have thought. In fact I think that we may be over packing the wound which is why she is having some discomfort at this point as there is much less of the dressing able to get and this time compared to what we saw previous. That is actually really good news. In fact the 6:00 tunnel appears to have filled in is awesome news. At the 12:00 location I am actually able to pack into  that area pretty effectively at this point today. In fact I was able to get less of the packing in which I will detail below. 12/18/2021 upon evaluation today patient appears to be doing well with regard to her wound on the back. Fortunately there is no signs of active infection which is great news and overall very pleased with where things stand today. No fevers, chills, nausea, vomiting, or diarrhea. 01/01/2022 upon evaluation today patient appears to be doing decently well in regard to her wound. Fortunately there does not appear to be any signs of anything worsening which is great news. She still continues to have issues with drainage but this is not nearly as significant as what it was in the past. There is all clear drainage no signs of purulence noted. 01/08/2022 upon evaluation today patient appears to be doing well with regard to her wound. With that being said she tells me that she has been having some increased pain over the past week. It does appear based on the amount of Hydrofera Blue that we removed this is probably over pack. Again it is difficult to know exactly how old the nurses getting all the skin but nonetheless the length of Hydrofera Blue that was utilized was way too much which may be part of the reason why this felt so uncomfortable. Fortunately I think that we can cut back on that we discussed pieces smaller so hopefully that will not  be over packed. 01/22/2021 upon evaluation today patient appears to be doing well With regard to there being no signs of infection at this time. The wound does still tunnel mainly up at the 12:00 location. The depth of the tunnel complete from entry point to the base is about 3.5 cm today. With that being said I do believe that overall she is making good progress this is just a very slow process for her which I know has been frustrating as well. 01/29/2022 upon evaluation today patient appears to be doing well with regard to the wound on her lower  back and the lumbar spine region. The good news is the depth that I got this week was a little bit less going up at the 12:00 location as compared to last week. I measured this to be 3.5 cm last week and it was 3.3 cm this week. Again that is a minute shift but nonetheless a good shift in the right direction. Overall I think that we are on the right track. 02/05/2022 upon evaluation today patient appears to be doing well with regard to her wound. In fact right now her measurements are somewhere around 2.9 cm which is definitely smaller and less deep than last week At the 12:00 location. Overall I am very pleased with what we are seeing. 02/19/2022 upon evaluation today patient unfortunately is noting that the wound is actually closing up externally but internally there is still some depth to this. I discussed with her today that we cannot let it close up like this is can end up being a significant issue for her if we allow for that. Subsequently we need to do what we can to try to open this up and keep it open more effectively. She voiced understanding. With that being said we are going to proceed with that procedure today in order to debride away some of the skin on externally that is trying to close and on this. 02/26/2022 upon evaluation patient appears to be doing better in my opinion after we had to open up this area last week. Again she has a significant amount of healing and overall I am extremely pleased with where things stand currently. I do not see any evidence of active infection locally nor systemically at this time which is great news and in general I think that we are on the right track for getting this hopefully closed final and done. 03/05/2022 upon evaluation today patient unfortunately is doing a little bit worse with regard to the whole size this is starting to get much smaller unfortunately. There does not appear to be any signs of active infection locally nor systemically which is great  news. With that being said I am concerned about the fact that the patient is doing quite a bit worse with regard to trapping some fluid and almost feels like she may have a fluid pocket that not only goes in and up towards 12:00 but looks back around towards the more superficial subcutaneous tissue. I am very concerned that this is going to be something that is very hard for [place] to heal short of another surgery to open this up and try to wound VAC from the inside out. Even then there is no guarantees this is just a very difficult situation to be perfectly honest. 03/14/2022 upon evaluation today patient appears to be doing well with regard to her wound as far as the area staying open and pleased in that regard. With that being said unfortunately  she is not doing as well when it comes to the irritation around it appears to be very inflamed and red this is not good that was discussed with her today as well. Subsequently I do believe that working to need to do what we can do to try and calm this down in the past she did well with the Augmentin due to Enterococcus infection I am not sure if were dealing with the same thing or not but at the same time I think that is probably to be my go to at this point. 03/19/2022 upon evaluation today patient appears to be doing really about the same in regard to her wound that the pain is better. 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 She seems to be improved dramatically with the antibiotic currently although it does not cover for MRSA I am apt to just see how this progresses over the next several days to a week. With that being said the bigger issue here is that we simply do not seem to be making excellent progress and I am concerned about the lack of progress. I discussed with Dr. Shon Baton with [facility] in [place] and to be honest his recommendation was that the best bet would probably be to get her to a teaching center. She is actually  been seen for rheumatology and a I believe psychologist/psychiatrist at [facility]. She would prefer to go that direction as opposed to [facility] or [facility]. I am definitely okay with that. 03/26/2022 upon evaluation today patient appears to be doing okay in regard to her wound. She is not showing signs of worsening and overall she tells me that her pain is not nearly as bad as it was previous. Fortunately I do not see any evidence of active infection locally or systemically which is great news. No fevers, chills, nausea, vomiting, or diarrhea. 04-02-2022 upon evaluation today patient appears to be doing well with regard to her wound. It does not appear to be showing any signs of worsening which is great news. With that being said you are still having some issues here with drainage although it is clear and mainly just tenderness with bleeding from the [facility] I do think that treating for the second issue which is MRSA is probably the right thing to do at this point she is now off of the Augmentin. 04-09-2022 upon evaluation today patient's wound actually appears to be doing about the same. Fortunately I do not see any evidence of active infection locally or systemically at this time which is great news. No fevers, chills, nausea, vomiting, or diarrhea. 04-16-2022 upon evaluation today patient appears to be doing well with regard to her wound in the lumbar spine region. Subsequently she continues to have an area that does have a tunnel up into the 12:00 location. This is about 3.5 cm using a skinny probe curved upwards to get into this region. With that being said I really do not see any signs of overall improvement but also do not see any signs of worsening I feel like work on about a still made here to some degree. The patient did see her neurologist and he has referred her to neurosurgery at [facility] for second opinion we will see what they have to say as well. 04-23-2022 upon evaluation today patient appears to  be doing well with regard to her wound all things considered. She has been tolerating the dressing changes without complication. Fortunately I do not see any  signs of active infection locally nor systemically which is great news. No fever chills noted 04-30-2022 upon evaluation today patient appears to be doing about the same in regard to her wound. The depth is really not dramatically improved as far as the 12:00 tunnel is concerned. The overall depth and the base of the wound does seem to be a little bit better it does sound like that external wound has closed as far as the opening is concerned we can have to cut this down from a half to a smaller size based on what we are seeing today. 05-07-2022 upon evaluation today patient's wound actually seems to be doing decently well. There is not a major shift here but a slight shift in the depth both straight and as well as in the [place] at 12:00. With that being said I again we will talk about a couple of millimeters but still every little bit can count when there are situations like this to be honest. 05-14-2022 upon evaluation today patient appears to be doing okay in regard to her wound. I do not see any signs of anything worsening. With that being said also do not see getting significantly better unfortunately. There does not appear to be any evidence of infection locally or systemically which is great news. No [name], [name] ([id number]) [id number] Page 9 of 11 fevers, chills, nausea, vomiting, or diarrhea. 05-21-2022 upon evaluation today patient actually appears to be doing quite well in regard to her wound from the standpoint of there being no infection. With that being said there does not appear to be any evidence of infection locally nor systemically which is great news. No fevers, chills, nausea, vomiting, or diarrhea. 05-28-2022 upon evaluation today patient's wound actually appears to be doing about the same. I do not  see any evidence of active infection locally or systemically which is great news. No fevers, chills, nausea, vomiting, or diarrhea. 06-04-2022 upon evaluation today patient appears to be doing well with regard to her wound. She has been tolerating dressing changes without complication. Fortunately I do not feel like there is any worsening also I do not feel like there is any improvement. I feel like right a solid area here where she has a tunnel that tracks up at 12:00 and then has a fluid pocket at around roughly 1:00 I can push on this and squeeze fluid out. Again I am not exactly sure what else can be done from my standpoint to improve this. She does have an appointment with Dr. Christella Noa who I do believe is an excellent surgeon and this is the surgeon who performed this surgery initially. Again the biggest issue was following she had a lot of trouble with the wound VAC I do think that we need to try to see if she is can have a wound VAC following surgery what exactly regular need to do to help keep this moving in the right direction for her. Obviously I want her to have the best result possible that she has to go through surgery again to clean this area out. Absolutely willing to help out with getting this to heal. 06-13-2022 upon evaluation today patient appears to be doing well with regard to her wound its not any worse unfortunately it is also not significantly better which is the only issue currently. I do not see any signs of active infection locally or systemically which is great news. No fevers, chills, nausea, vomiting, or diarrhea. She does have her appointment on Monday  with her neurosurgeon and we will see you Dr. Franky Macho has to say at that point. 06-18-2022 upon evaluation patient appears to be doing a little worse in regard to her wound only in the respect that has been several days since she has had this changed in fact it was last changed on Friday. Since that time she tells me that she has  kept this in place over the weekend and yesterday she was supposed to see her neurosurgeon yesterday but they had to cancel due to an emergency surgery according to what the patient tells me. Nonetheless she is having some issues here with drainage coming from the wound that is more purulent in nature I think this is something we have been seeing in the [facility] for several weeks but is more obvious being that it was not changed as much during the last week. She unfortunately is a little worried about this but again I think that this is just part of what we have been dealing with all long is more prevalent and obvious due to the length of time since it was changed last. She does have a repeat appointment for I believe July 10 she told me although she is going to see if she can find anything sooner. 06-25-2022 upon evaluation today patient's wound actually showed signs of marginal improvement which is good news. She still has the area of abscess that seems to be at roughly 2:00 when looking at her back. We can get into this with the skinny probe other than that I really do not know what else I can do to try to make this better. We discussed this and she does have an appointment with her neurosurgeon although that is not till mid July as he had to change it at the last moment during the time she was post to see him a week ago. She does have evidence of potential infection on culture I am going to go ahead and treat this in order to ensure that nothing worsens. With that being said I do believe that overall she seems to be doing well but I do think we want to make sure that that the knee gets worse in the meantime until she gets to see her neurosurgeon. 07-01-2022 upon evaluation today patient appears to continue to have trouble with her wound draining. Again we have been continue to monitor this and I still see the same issue that I have noted before the patient has an area which is draining seemingly  from around the 2:00 location when you go up towards 12:00 and then over towards 2:00 looking at her back this is where there is actually a soft area external that if you push on it you will see fluid come out and subsequently this is also where it tracks to internally. Everything in the 6:00 location has completely healed in and this looks much better in that regard but this upper portion we just cannot get to closed with the methods that we have. It seemed to me that this is probably going to require that this area be open and cleaned out in order to allow it to heal and my suggestion would be that a wound VAC is probably going to be the ideal thing. 07-09-2022 upon evaluation today patient appears to be doing somewhat poorly in regard to her wound. She did see Dr. Franky Macho. He stated that the wound was not being "packed appropriately" according to the patient and her daughter who were  present during the evaluation today as well as the evaluation with him yesterday. With that being said he packed the wound the way he said it needed to be and to be partly honest that is over packed. The patient is having a tremendous amount of discomfort and states that this is no way that she is good to be able to tolerate that. I understand the concern about the whole closing down which is why I recommended trying to keep this open is much as possible and again with that all being said this is still going to continue to be an issue as far as this closing down before everything heals up on the inside. Nonetheless she continues to have issues with significant drainage. She also has an area which almost feels like a small abscess or least of fluid pocket that feels up that was not appropriately packed with the dressings that were in place. With that being said I think that this is going to have to be opened at some point in time in order to get this to heal I am really not certain the best way to go about this.  I discussed that with the patient again today. 07-16-2022 upon evaluation today patient appears to be doing well currently in regard to her wound from the standpoint of the pain getting better which is great news. Fortunately I do not see any evidence of active infection locally or systemically which is great news. No fevers, chills, nausea, vomiting, or diarrhea. With that being said the patient has been tolerating the dressing changes better without complication which is great news. 07-29-2022 upon evaluation today patient appears to be doing actually pretty well in regard to her wound. Fortunately there does not appear to be any signs of infection. She still has drainage and we still are seeing an area from this pocket up at 12:00 that is leaking but fortunately this does not appear to be having any significant issues with infection right now which is great news. Fortunately her pain is also calm down since I saw her 2 weeks ago. 08-05-2022 upon evaluation today patient appears to be doing unfortunately a little worse in regard to the discomfort she is feeling she tells me that this is bothering her pretty much all the time at a low level. It never seems to go away. This is since last week. With that being said the overall depth is unfortunately a little bit deeper this week which is definitely not what I was hoping to see. I did get to review the second opinion that was requested by the patient from her insurance to have an independent physician review the situation here. The short story of that reviewed which I did read through the entirety of she states that the patient does likely need surgery to correct the seroma considering length of time this has been going on. It was recommended that a plastic surgeon be the one to have this up in case there is a muscle flap necessary but at minimum that this area needs to be cleaned out and then appropriately close following. The patient has also read through  this. She does seem to be a little bit off of her normal game not feeling quite that well today. She is not exactly sure what is going on. 08-12-2022 upon evaluation today patient's wound is actually showing signs of maintaining stability which is good news. Fortunately there does not appear to be any evidence of active infection locally or  systemically at this time which is excellent. With that being said she does have a meeting with her nurse with [employer] [facility] on Thursday to discuss options for plastic surgery where she is able to go. 08-19-2022 upon evaluation today patient appears to be doing well currently in regard to her wound all things considered. Unfortunately the patient is still continuing to have some issues here with an ongoing open wound in the back despite everything that we have tried she has had an independent review which recommended surgical intervention. Where the process of trying to find someone to take her on as a patient. The suggestion was for plastic surgery which I think is probably a good option here. With that being said she has been looking through her insurance to see who was in network for her and has found someone that I think will be very good. Working to see about making that referral to them. 08-26-2022 upon evaluation today patient appears to be doing well currently in regard to her wound. She tells me she is having some discomfort fortunately there does not appear to be any evidence of active infection at this time which is great news. We have actually gotten her an appointment finally with the plastic surgeon that is 4 I believe it is September 26. 09-03-2022 upon evaluation today patient appears to be doing well currently in regard to her wound everything appears to be stable which is good in that regard. With that being said the drainage does appear to be a little more thick compared to normal she has been having a lot of irritation as well. I do believe that  it may be at the point that the repeating the Augmentin that she is previously done well with in the past could be of benefit for her here currently. The patient voiced understanding she has not had this since September 27 the last time I prescribed this for her. Nonetheless it did do a very good job to calm things down quite a bit back at that time. 09-09-2022 upon evaluation today patient appears to be doing well currently in regard to her wound she is stable. There does not appear to be anything worsening at this point. Fortunately I see no signs of active infection at this time. No fevers, chills, nausea, vomiting, or diarrhea. [name], [name] ([id number]) [id number] Page 10 of 11 09-16-2022 upon evaluation today patient appears to be doing well currently regard to her wound there does not appear to be any signs of active infection locally or systemically at this time which is great news. No fevers, chills, nausea, vomiting, or diarrhea. 09-23-2022 upon evaluation today patient appears to be doing well currently in regard to her wound all things considered this is still stable although not significantly better. There is still some purulent drainage noted again she has been on antibiotics this continues to be of concern. Fortunately there does not appear to be any evidence of active infection locally or systemically at this time which is great news. 09-30-2022 upon evaluation today patient appears to be doing very well in regard to her wound and things are stable. She also did see the surgeon, Dr. Ferd Hibbs at [facility] in [place]. She actually has a plan to go forward with performing surgery in order to close this wound which I think is definitely going to be the right thing to do. Fortunately I do not see any signs of active infection locally or systemically which is great news and my hope  is they will be able to surgically get this closed so that she can move on from  this aspect of her life where she has been really struggling to get back to more normal situation. 10-07-2022 upon evaluation today patient appears to be doing well currently in regard to her wound. This is showing signs of continued stability I do not see any signs of anything worsening which is great news and overall made it very pleased in that regard. No fevers, chills, nausea, vomiting, or diarrhea. 10-14-2022 upon evaluation today patient appears to be doing well currently in regard to her wound she does not appear to be showing any signs of infection which is great news. No fevers, chills, nausea, vomiting, or diarrhea. Of note she does have her appointment with the surgeon for the actual surgery this coming Friday which is great news. Were hopeful that this is going to take care of the situation she may not even need the wound VAC but they have everything approved for any eventuality [place] wound VAC or otherwise. Objective Constitutional Well-nourished and well-hydrated in no acute distress. Vitals Time Taken: 2:09 PM, Height: 58 in, Weight: 275 lbs, BMI: 57.5, Temperature: 98.1 F, Pulse: 81 bpm, Respiratory Rate: 18 breaths/min, Blood Pressure: 170/94 mmHg. Respiratory normal breathing without difficulty. Psychiatric this patient is able to make decisions and demonstrates good insight into disease process. Alert and Oriented x 3. pleasant and cooperative. General Notes: Upon inspection patient's wound bed again appears to be about the same as far as depth and undermining there is no significant difference here at this point she still has the tracking up to toward 12:00. Integumentary (Hair, Skin) Wound #1 status is Open. Original cause of wound was Surgical Injury. The date acquired was: 04/11/2021. The wound has been in treatment 52 weeks. The wound is located on the Distal,Midline Back. The wound measures 0.3cm length x 0.3cm width x 2.5cm depth; 0.071cm^2 area and 0.177cm^3  volume. There is Fat Layer (Subcutaneous Tissue) exposed. There is no tunneling or undermining noted. There is a medium amount of serosanguineous drainage noted. There is large (67-100%) red granulation within the wound bed. There is no necrotic tissue within the wound bed. 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 I would recommend currently that we have the patient continue to monitor for any signs of worsening or infection. Obviously if anything changes she knows contact the office and let me know. Otherwise we will continue with the [facility] for now until she has her appointment for the surgery on Friday. 2. I am also can recommend the patient should continue to change this on a regular basis until that point. We will see patient back for reevaluation in 1 week here in the clinic. If anything worsens or changes patient will contact our office for additional recommendations. Electronic Signature(s) Signed: 10/14/2022 11:31:25 AM By: [name] PA-C Entered By: [name] on 10/14/2022 14:31:24 [name], [name] ([id number]) [id number] Page 11 of 11 -------------------------------------------------------------------------------- SuperBill Details Patient Name: Date of Service: EV A NS, HA RRIETTE C. 10/14/2022 Medical Record Number: [id number] Patient Account Number: [id number] Date of Birth/Sex: Treating RN: 12/23/1961 (61 y.o. 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 G35 Multiple sclerosis I10 Essential (primary) hypertension Facility Procedures : CPT4 Code: 18841660 Description: 63016 - WOUND CARE VISIT-LEV 2 EST PT Modifier: Quantity: 1 Physician Procedures : CPT4 Code Description Modifier [id number] - WC PHYS LEVEL 3 - EST PT ICD-10 Diagnosis Description T81.31XA Disruption of external operation (surgical) wound, not elsewhere classified, initial encounter 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

## 2022-10-14 NOTE — Progress Notes (Addendum)
RAYLEE, STREHL ([id number]) [id number] Page 1 of 8 Visit Report for 10/14/2022 Arrival Information Details Patient Name: Date of Service: [name], [name] RRIETTE [name]. 10/14/2022 2:00 PM Medical Record Number: [id number] Patient Account Number: [id number] Date of Birth/Sex: Treating RN: September 05, 1961 (61 y.o. [name] Primary Care Serenna Deroy: [name] Other Clinician: Referring Elexis Pollak: Treating Keziah Drotar/Extender: Vickii Chafe in Treatment: 52 Visit Information History Since Last Visit All ordered tests and consults were completed: No Patient Arrived: [name] Added or deleted any medications: No Arrival Time: 14:00 Any new allergies or adverse reactions: No Transfer Assistance: None Had a fall or experienced change in No Patient Requires Transmission-Based Precautions: No activities of daily living that may affect Patient Has Alerts: No risk of falls: Signs or symptoms of abuse/neglect since last visito No Hospitalized since last visit: No Implantable device outside of the clinic excluding No cellular tissue based products placed in the center since last visit: Has Dressing in Place as Prescribed: Yes Pain Present Now: No Electronic Signature(s) Signed: 10/16/2022 8:53:50 AM By: [name] Pax RN Entered By: [name] on 10/14/2022 14:09:10 -------------------------------------------------------------------------------- Clinic Level of Care Assessment Details Patient Name: Date of Service: [name], [name] RRIETTE [name]. 10/14/2022 2:00 PM Medical Record Number: [id number] Patient Account Number: [id number] Date of Birth/Sex: Treating RN: 1961/03/03 (61 y.o. [name] Primary Care Rithika Seel: [name] Other Clinician: Referring Altariq Goodall: Treating Elzada Pytel/Extender: Vickii Chafe in Treatment: 52 Clinic Level of Care Assessment Items TOOL 4 Quantity Score X- 1 0 Use when only an EandM is performed on FOLLOW-UP  visit ASSESSMENTS - Nursing Assessment / Reassessment X- 1 10 Reassessment of Co-morbidities (includes updates in patient status) X- 1 5 Reassessment of Adherence to Treatment Plan [name], [name] ([id number]) [id number] Page 2 of 8 ASSESSMENTS - Wound and Skin A ssessment / Reassessment X - Simple Wound Assessment / Reassessment - one wound 1 5 []  - 0 Complex Wound Assessment / Reassessment - multiple wounds []  - 0 Dermatologic / Skin Assessment (not related to wound area) ASSESSMENTS - Focused Assessment []  - 0 Circumferential Edema Measurements - multi extremities []  - 0 Nutritional Assessment / Counseling / Intervention []  - 0 Lower Extremity Assessment (monofilament, tuning fork, pulses) []  - 0 Peripheral Arterial Disease Assessment (using hand held doppler) ASSESSMENTS - Ostomy and/or Continence Assessment and Care []  - 0 Incontinence Assessment and Management []  - 0 Ostomy Care Assessment and Management (repouching, etc.) PROCESS - Coordination of Care X - Simple Patient / Family Education for ongoing care 1 15 []  - 0 Complex (extensive) Patient / Family Education for ongoing care []  - 0 Staff obtains , Records, T Results / Process Orders est []  - 0 Staff telephones HHA, Nursing Homes / Clarify orders / etc []  - 0 Routine Transfer to another Facility (non-emergent condition) []  - 0 Routine Hospital Admission (non-emergent condition) []  - 0 New Admissions / / Ordering NPWT Apligraf, etc. , []  - 0 Emergency Hospital Admission (emergent condition) X- 1 10 Simple Discharge Coordination []  - 0 Complex (extensive) Discharge Coordination PROCESS - Special Needs []  - 0 Pediatric / Minor Patient Management []  - 0 Isolation Patient Management []  - 0 Hearing / Language / Visual special needs []  - 0 Assessment of Community assistance (transportation, D/[name] planning, etc.) []  - 0 Additional assistance /  Altered mentation []  - 0 Support Surface(s) Assessment (bed, cushion, seat, etc.) INTERVENTIONS - Wound Cleansing / Measurement X - Simple Wound Cleansing -  one wound 1 5 []  - 0 Complex Wound Cleansing - multiple wounds X- 1 5 Wound Imaging (photographs - any number of wounds) []  - 0 Wound Tracing (instead of photographs) X- 1 5 Simple Wound Measurement - one wound []  - 0 Complex Wound Measurement - multiple wounds INTERVENTIONS - Wound Dressings X - Small Wound Dressing one or multiple wounds 1 10 []  - 0 Medium Wound Dressing one or multiple wounds []  - 0 Large Wound Dressing one or multiple wounds []  - 0 Application of Medications - topical []  - 0 Application of Medications - injection INTERVENTIONS - Miscellaneous []  - 0 External ear exam [name], [name] ( ) [id number] Page 3 of 8 []  - 0 Specimen Collection (cultures, biopsies, blood, body fluids, etc.) []  - 0 Specimen(s) / Culture(s) sent or taken to Lab for analysis []  - 0 Patient Transfer (multiple staff / Lift / Similar devices) []  - 0 Simple Staple / Suture removal (25 or less) []  - 0 Complex Staple / Suture removal (26 or more) []  - 0 Hypo / Hyperglycemic Management (close monitor of Blood Glucose) []  - 0 Ankle / Brachial Index (ABI) - do not check if billed separately X- 1 5 Vital Signs Has the patient been seen at the hospital within the last three years: Yes Total Score: 75 Level Of Care: New/Established - Level 2 Electronic Signature(s) Signed: 10/16/2022 8:53:50 AM By: RN Entered By: on 10/14/2022 14:38:12 -------------------------------------------------------------------------------- Encounter Discharge Information Details Patient Name: Date of Service: [name], [name] RRIETTE [name]. 10/14/2022 2:00 PM Medical Record Number: [id number] Patient Account Number: 03-20-2001 Date of Birth/Sex: Treating RN: 1961-04-01 (61 y.o. Primary Care Leana Springston: Michiel Sites Other Clinician: Referring Allee Busk: Treating Alazne Quant/Extender: in Treatment: 52 Encounter Discharge Information Items Discharge Condition: Stable Ambulatory Status: Walker Discharge Destination: Home Transportation: Private Auto Accompanied By: self Schedule Follow-up Appointment: Yes Clinical Summary of Care: Electronic Signature(s) Signed: 10/14/2022 2:40:07 PM By: RN Entered By: 10/18/2022 on 10/14/2022 14:40:06 Lower Extremity Assessment Details -------------------------------------------------------------------------------- [name] (10/16/2022) [id number] Page 4 of 8 Patient Name: Date of Service: [name], [name] RRIETTE [name]. 10/14/2022 2:00 PM Medical Record Number: [id number] Patient Account Number: 09/11/1961 Date of Birth/Sex: Treating RN: November 10, 1961 (61 y.o. [name] Primary Care Mischa Brittingham: Vickii Chafe Other Clinician: Referring Delylah Stanczyk: Treating Makinzie Considine/Extender: 53 in Treatment: 52 Electronic Signature(s) Signed: 10/16/2022 8:53:50 AM By: [name] Pax RN Entered By: [name] on 10/14/2022 14:11:07 -------------------------------------------------------------------------------- Multi Wound Chart Details Patient Name: Date of Service: [name], [name] RRIETTE [name]. 10/14/2022 2:00 PM Medical Record Number: [id number] Patient Account Number: 03-20-2001 Date of Birth/Sex: Treating RN: Jan 01, 1961 (61 y.o. [id number] Primary Care Mavin Dyke: 09/11/1961 Other Clinician: Referring Maxime Beckner: Treating Jovann Luse/Extender: 62 in Treatment: 52 Vital Signs Height(in): 58 Pulse(bpm): 81 Weight(lbs): 275 Blood Pressure(mmHg): 170/94 Body Mass Index(BMI): 57.5 Temperature(F): 98.1 Respiratory Rate(breaths/min): 18 [1:Photos:] [N/A:N/A] Distal, Midline Back N/A N/A Wound Location: Surgical Injury  N/A N/A Wounding Event: Dehisced Wound N/A N/A Primary Etiology: Hypertension N/A N/A Comorbid History: 04/11/2021 N/A N/A Date Acquired: 74 N/A N/A Weeks of Treatment: Open N/A N/A Wound Status: No N/A N/A Wound Recurrence: 0.3x0.3x2.5 N/A N/A Measurements L x W x D (cm) 0.071 N/A N/A A (cm) : rea 0.177 N/A N/A Volume (cm) : 43.70% N/A N/A % Reduction in A rea: 69.40% N/A N/A % Reduction in Volume: Full  Thickness Without Exposed N/A N/A Classification: Support Structures Medium N/A N/A Exudate Amount: Serosanguineous N/A N/A Exudate Type: red, brown N/A N/A Exudate Color: Large (67-100%) N/A N/A Granulation Amount: Red N/A N/A Granulation Quality: None Present (0%) N/A N/A Necrotic Amount: Fat Layer (Subcutaneous Tissue): Yes N/A N/A Exposed Structures: Fascia: No Tendon: No Muscle: No Hesch, Lihanna [name] ([id number]) [id number] Page 5 of 8 Joint: No Bone: No None N/A N/A Epithelialization: Treatment Notes Electronic Signature(s) Signed: 10/16/2022 8:53:50 AM By: [name] Coria RN Entered By: [name] on 10/14/2022 14:11:26 -------------------------------------------------------------------------------- Multi-Disciplinary Care Plan Details Patient Name: Date of Service: [name], [name] RRIETTE [name]. 10/14/2022 2:00 PM Medical Record Number: [id number] Patient Account Number: [id number] Date of Birth/Sex: Treating RN: 1961/04/10 (61 y.o. [name] Primary Care Talonda Artist: Clayborn Heron Other Clinician: Referring Chayce Robbins: Treating Evolett Somarriba/Extender: Kristian Covey in Treatment: 57 Active Inactive Electronic Signature(s) Signed: 11/27/2022 2:39:31 PM By: Gretta Cool BSN, RN, CWS, Kim RN, BSN Signed: 11/28/2022 4:42:13 PM By: [name] Coria RN Previous Signature: 10/16/2022 8:53:50 AM Version By: [name] Coria RN Entered By: Gretta Cool BSN, RN, CWS, Kim on 11/27/2022  14:39:30 -------------------------------------------------------------------------------- Pain Assessment Details Patient Name: Date of Service: [name], [name] RRIETTE [name]. 10/14/2022 2:00 PM Medical Record Number: [id number] Patient Account Number: [id number] Date of Birth/Sex: Treating RN: 05-06-1961 (61 y.o. [name] Primary Care Lakeyia Surber: Clayborn Heron Other Clinician: Referring Merve Hotard: Treating Lynore Coscia/Extender: Kristian Covey in Treatment: 52 Active Problems Location of Pain Severity and Description of Pain Patient Has Paino Yes Site Locations With Dressing Change: [name], [name] ([id number]) [id number] Page 6 of 8 Duration of the Pain. Constant / Intermittento Intermittent How Long Does it Lasto Hours: Minutes: 15 Rate the pain. Current Pain Level: 5 Worst Pain Level: 8 Least Pain Level: 0 Tolerable Pain Level: 5 Character of Pain Describe the Pain: Aching, Burning Pain Management and Medication Current Pain Management: Medication: Yes Cold Application: No Rest: Yes Massage: No Activity: No T.E.N.S.: No Heat Application: No Leg drop or elevation: No Is the Current Pain Management Adequate: Inadequate How does your wound impact your activities of daily livingo Sleep: No Bathing: No Appetite: No Relationship With Others: No Bladder Continence: No Emotions: No Bowel Continence: No Work: No Toileting: No Drive: No Dressing: No Hobbies: No Electronic Signature(s) Signed: 10/16/2022 8:53:50 AM By: [name] Coria RN Entered By: [name] on 10/14/2022 14:10:19 -------------------------------------------------------------------------------- Patient/Caregiver Education Details Patient Name: Date of Service: [name], [name] RRIETTE [name]. 10/16/2023andnbsp2:00 PM Medical Record Number: [id number] Patient Account Number: [id number] Date of Birth/Gender: Treating RN: 25-Sep-1961 (61 y.o. [name] Primary Care  Physician: Clayborn Heron Other Clinician: Referring Physician: Treating Physician/Extender: Kristian Covey in Treatment: 25 Education Assessment Education Provided To: Patient Education Topics Provided Wound/Skin Impairment: Methods: Explain/Verbal Responses: State content correctly [name], [name] ([id number]) [id number] Page 7 of 8 Electronic Signature(s) Signed: 10/16/2022 8:53:50 AM By: [name] Coria RN Entered By: [name] on 10/14/2022 14:38:35 -------------------------------------------------------------------------------- Wound Assessment Details Patient Name: Date of Service: [name], [name] RRIETTE [name]. 10/14/2022 2:00 PM Medical Record Number: [id number] Patient Account Number: [id number] Date of Birth/Sex: Treating RN: [birth year] (61 y.o. [name] Primary Care Eissa Buchberger: Clayborn Heron Other Clinician: Referring Tanyla Stege: Treating Tyrena Gohr/Extender: Kristian Covey in Treatment: 52 Wound Status Wound Number: 1 Primary Etiology: Dehisced Wound Wound Location: Distal, Midline Back Wound Status: Healed - Surgical Closure Wounding Event: Surgical Injury Comorbid History: Hypertension Date Acquired: 04/11/2021 Weeks Of  Treatment: 52 Clustered Wound: No Photos Wound Measurements Length: (cm) Width: (cm) Depth: (cm) Area: (cm) Volume: (cm) 0 % Reduction in Area: 43.7% 0 % Reduction in Volume: 69.4% 0 Epithelialization: None 0 Tunneling: No 0 Undermining: Yes Starting Position (o'clock): 9 Ending Position (o'clock): 3 Maximum Distance: (cm) 3.2 Wound Description Classification: Full Thickness Without Exposed Suppor Exudate Amount: Medium Exudate Type: Serosanguineous Exudate Color: red, brown t Structures Foul Odor After Cleansing: No Slough/Fibrino No Wound Bed Granulation Amount: Large (67-100%) Exposed Structure Granulation Quality: Red Fascia Exposed: No Necrotic Amount: None Present (0%) Fat  Layer (Subcutaneous Tissue) Exposed: Yes Tendon Exposed: No Muscle Exposed: No [name], [name] ([id number]) [id number] Page 8 of 8 Joint Exposed: No Bone Exposed: No Treatment Notes Wound #1 (Back) Wound Laterality: Midline, Distal Cleanser Normal Saline Discharge Instruction: Wash your hands with soap and water. Remove old dressing, discard into plastic bag and place into trash. Cleanse the wound with Normal Saline prior to applying a clean dressing using gauze sponges, not tissues or cotton balls. Do not scrub or use excessive force. Pat dry using gauze sponges, not tissue or cotton balls. Peri-Wound Care Topical Primary Dressing Hydrofera Blue Rope Discharge Instruction: cut into fourths; angle the end Secondary Dressing (BORDER) Zetuvit Plus SILICONE BORDER Dressing 4x4 (in/in) Secured With Compression Wrap Compression Stockings Facilities manager) Signed: 01/09/2023 5:22:07 PM By: Elliot Gurney, BSN, RN, CWS, Kim RN, BSN Previous Signature: 10/14/2022 11:39:25 AM Version By: [name] Pax RN Entered By: Elliot Gurney, BSN, RN, CWS, Kim on 11/27/2022 14:39:00 -------------------------------------------------------------------------------- Vitals Details Patient Name: Date of Service: [name], [name] RRIETTE [name]. 10/14/2022 2:00 PM Medical Record Number: [id number] Patient Account Number: [id number] Date of Birth/Sex: Treating RN: [birth year] (61 y.o. [name] Primary Care Raia Amico: [name] Other Clinician: Referring Jameia Makris: Treating Shelba Susi/Extender: Vickii Chafe in Treatment: 52 Vital Signs Time Taken: 14:09 Temperature (F): 98.1 Height (in): 58 Pulse (bpm): 81 Weight (lbs): 275 Respiratory Rate (breaths/min): 18 Body Mass Index (BMI): 57.5 Blood Pressure (mmHg): 170/94 Reference Range: 80 - 120 mg / dl Electronic Signature(s) Signed: 10/16/2022 8:53:50 AM By: [name] Pax RN Entered By: [name] on 10/14/2022  14:09:28

## 2022-10-21 ENCOUNTER — Encounter: Payer: 59 | Admitting: Physician Assistant

## 2022-10-28 ENCOUNTER — Encounter: Payer: 59 | Admitting: Physician Assistant

## 2022-11-04 ENCOUNTER — Ambulatory Visit: Payer: 59 | Admitting: Internal Medicine

## 2022-11-11 ENCOUNTER — Ambulatory Visit: Payer: 59 | Admitting: Physician Assistant

## 2022-11-18 ENCOUNTER — Ambulatory Visit: Payer: 59 | Admitting: Physician Assistant

## 2022-11-25 ENCOUNTER — Ambulatory Visit: Payer: 59 | Admitting: Physician Assistant

## 2023-01-21 ENCOUNTER — Telehealth (HOSPITAL_BASED_OUTPATIENT_CLINIC_OR_DEPARTMENT_OTHER): Payer: Self-pay

## 2023-01-21 ENCOUNTER — Other Ambulatory Visit (HOSPITAL_BASED_OUTPATIENT_CLINIC_OR_DEPARTMENT_OTHER): Payer: Self-pay | Admitting: Infectious Diseases

## 2023-01-21 ENCOUNTER — Encounter (HOSPITAL_BASED_OUTPATIENT_CLINIC_OR_DEPARTMENT_OTHER): Payer: Self-pay | Admitting: Infectious Diseases

## 2023-01-21 DIAGNOSIS — T8149XA Infection following a procedure, other surgical site, initial encounter: Secondary | ICD-10-CM

## 2023-01-22 ENCOUNTER — Telehealth (HOSPITAL_BASED_OUTPATIENT_CLINIC_OR_DEPARTMENT_OTHER): Payer: Self-pay

## 2023-01-27 ENCOUNTER — Telehealth (HOSPITAL_BASED_OUTPATIENT_CLINIC_OR_DEPARTMENT_OTHER): Payer: Self-pay

## 2023-03-01 ENCOUNTER — Ambulatory Visit (HOSPITAL_BASED_OUTPATIENT_CLINIC_OR_DEPARTMENT_OTHER): Payer: Medicaid Other

## 2023-03-08 ENCOUNTER — Ambulatory Visit (HOSPITAL_BASED_OUTPATIENT_CLINIC_OR_DEPARTMENT_OTHER)
Admission: RE | Admit: 2023-03-08 | Discharge: 2023-03-08 | Disposition: A | Discharge status: Home / Self Care | Payer: Medicaid Other | Source: Ambulatory Visit | Attending: Infectious Diseases | Admitting: Infectious Diseases

## 2023-03-08 DIAGNOSIS — T8149XA Infection following a procedure, other surgical site, initial encounter: Secondary | ICD-10-CM | POA: Diagnosis present

## 2023-03-08 MED ORDER — GADOBUTROL 1 MMOL/ML IV SOLN
10.0000 mL | Freq: Once | INTRAVENOUS | Status: AC | PRN
  Administered 2023-03-08: 10 mL via INTRAVENOUS

## 2024-08-02 ENCOUNTER — Emergency Department (HOSPITAL_COMMUNITY): Admitting: Anesthesiology

## 2024-08-02 ENCOUNTER — Emergency Department (HOSPITAL_COMMUNITY)

## 2024-08-02 ENCOUNTER — Inpatient Hospital Stay (HOSPITAL_COMMUNITY)
Admission: EM | Admit: 2024-08-02 | Discharge: 2024-08-17 | DRG: 326 | Disposition: A | Discharge status: Skilled Nursing Facility | Attending: Surgery | Admitting: Surgery

## 2024-08-02 ENCOUNTER — Other Ambulatory Visit: Payer: Self-pay

## 2024-08-02 ENCOUNTER — Encounter (HOSPITAL_COMMUNITY): Payer: Self-pay

## 2024-08-02 ENCOUNTER — Encounter (HOSPITAL_COMMUNITY): Admission: EM | Disposition: A | Discharge status: Skilled Nursing Facility | Payer: Self-pay | Source: Home / Self Care

## 2024-08-02 DIAGNOSIS — Z8249 Family history of ischemic heart disease and other diseases of the circulatory system: Secondary | ICD-10-CM | POA: Diagnosis not present

## 2024-08-02 DIAGNOSIS — D62 Acute posthemorrhagic anemia: Secondary | ICD-10-CM | POA: Diagnosis present

## 2024-08-02 DIAGNOSIS — E66813 Obesity, class 3: Secondary | ICD-10-CM

## 2024-08-02 DIAGNOSIS — G35 Multiple sclerosis: Secondary | ICD-10-CM | POA: Diagnosis present

## 2024-08-02 DIAGNOSIS — Z9049 Acquired absence of other specified parts of digestive tract: Secondary | ICD-10-CM | POA: Diagnosis not present

## 2024-08-02 DIAGNOSIS — Z79899 Other long term (current) drug therapy: Secondary | ICD-10-CM | POA: Diagnosis not present

## 2024-08-02 DIAGNOSIS — Z791 Long term (current) use of non-steroidal anti-inflammatories (NSAID): Secondary | ICD-10-CM | POA: Diagnosis not present

## 2024-08-02 DIAGNOSIS — G8929 Other chronic pain: Secondary | ICD-10-CM | POA: Diagnosis present

## 2024-08-02 DIAGNOSIS — Z6841 Body Mass Index (BMI) 40.0 and over, adult: Secondary | ICD-10-CM | POA: Diagnosis not present

## 2024-08-02 DIAGNOSIS — I10 Essential (primary) hypertension: Secondary | ICD-10-CM

## 2024-08-02 DIAGNOSIS — Z23 Encounter for immunization: Secondary | ICD-10-CM | POA: Diagnosis present

## 2024-08-02 DIAGNOSIS — I4510 Unspecified right bundle-branch block: Secondary | ICD-10-CM | POA: Diagnosis present

## 2024-08-02 DIAGNOSIS — M170 Bilateral primary osteoarthritis of knee: Secondary | ICD-10-CM | POA: Diagnosis present

## 2024-08-02 DIAGNOSIS — I959 Hypotension, unspecified: Secondary | ICD-10-CM | POA: Diagnosis present

## 2024-08-02 DIAGNOSIS — Z8349 Family history of other endocrine, nutritional and metabolic diseases: Secondary | ICD-10-CM

## 2024-08-02 DIAGNOSIS — K251 Acute gastric ulcer with perforation: Secondary | ICD-10-CM | POA: Diagnosis not present

## 2024-08-02 DIAGNOSIS — K255 Chronic or unspecified gastric ulcer with perforation: Principal | ICD-10-CM | POA: Diagnosis present

## 2024-08-02 DIAGNOSIS — Z981 Arthrodesis status: Secondary | ICD-10-CM | POA: Diagnosis not present

## 2024-08-02 DIAGNOSIS — Z809 Family history of malignant neoplasm, unspecified: Secondary | ICD-10-CM | POA: Diagnosis not present

## 2024-08-02 DIAGNOSIS — K650 Generalized (acute) peritonitis: Secondary | ICD-10-CM | POA: Diagnosis present

## 2024-08-02 DIAGNOSIS — R198 Other specified symptoms and signs involving the digestive system and abdomen: Principal | ICD-10-CM

## 2024-08-02 DIAGNOSIS — E559 Vitamin D deficiency, unspecified: Secondary | ICD-10-CM | POA: Diagnosis present

## 2024-08-02 DIAGNOSIS — E7800 Pure hypercholesterolemia, unspecified: Secondary | ICD-10-CM | POA: Diagnosis present

## 2024-08-02 DIAGNOSIS — R0789 Other chest pain: Secondary | ICD-10-CM

## 2024-08-02 HISTORY — PX: GASTRORRHAPHY: SHX6263

## 2024-08-02 HISTORY — PX: LAPAROTOMY: SHX154

## 2024-08-02 LAB — BASIC METABOLIC PANEL WITH GFR
Anion gap: 11 (ref 5–15)
BUN: 18 mg/dL (ref 8–23)
CO2: 26 mmol/L (ref 22–32)
Calcium: 9.2 mg/dL (ref 8.9–10.3)
Chloride: 99 mmol/L (ref 98–111)
Creatinine, Ser: 0.99 mg/dL (ref 0.44–1.00)
GFR, Estimated: >60 mL/min (ref >60)
Glucose, Bld: 139 mg/dL — ABNORMAL HIGH (ref 70–99)
Potassium: 3.7 mmol/L (ref 3.5–5.1)
Sodium: 136 mmol/L (ref 135–145)

## 2024-08-02 LAB — TYPE AND SCREEN
ABO/RH(D): A POS
Antibody Screen: NEGATIVE

## 2024-08-02 LAB — HEPATIC FUNCTION PANEL
ALT: 15 U/L (ref 0–44)
AST: 18 U/L (ref 15–41)
Albumin: 3.2 g/dL — ABNORMAL LOW (ref 3.5–5.0)
Alkaline Phosphatase: 82 U/L (ref 38–126)
Bilirubin, Direct: <0.1 mg/dL (ref 0.0–0.2)
Total Bilirubin: 0.5 mg/dL (ref 0.0–1.2)
Total Protein: 6.9 g/dL (ref 6.5–8.1)

## 2024-08-02 LAB — D-DIMER, QUANTITATIVE: D-Dimer, Quant: 4.42 ug{FEU}/mL — ABNORMAL HIGH (ref 0.00–0.50)

## 2024-08-02 LAB — CBC
HCT: 35.3 % — ABNORMAL LOW (ref 36.0–46.0)
Hemoglobin: 11.1 g/dL — ABNORMAL LOW (ref 12.0–15.0)
MCH: 28.5 pg (ref 26.0–34.0)
MCHC: 31.4 g/dL (ref 30.0–36.0)
MCV: 90.5 fL (ref 80.0–100.0)
Platelets: 327 K/uL (ref 150–400)
RBC: 3.9 MIL/uL (ref 3.87–5.11)
RDW: 13.3 % (ref 11.5–15.5)
WBC: 13.4 K/uL — ABNORMAL HIGH (ref 4.0–10.5)
nRBC: 0 % (ref 0.0–0.2)

## 2024-08-02 LAB — TROPONIN I (HIGH SENSITIVITY)
Troponin I (High Sensitivity): 7 ng/L (ref <18)
Troponin I (High Sensitivity): 8 ng/L (ref <18)

## 2024-08-02 LAB — LIPASE, BLOOD: Lipase: 24 U/L (ref 11–51)

## 2024-08-02 SURGERY — LAPAROTOMY, EXPLORATORY
Anesthesia: General | Site: Abdomen

## 2024-08-02 MED ORDER — METHOCARBAMOL 1000 MG/10ML IJ SOLN
500.0000 mg | Freq: Three times a day (TID) | INTRAMUSCULAR | Status: DC
  Administered 2024-08-02 – 2024-08-09 (×22): 500 mg via INTRAVENOUS
  Filled 2024-08-02 (×22): qty 10

## 2024-08-02 MED ORDER — ENOXAPARIN SODIUM 40 MG/0.4ML IJ SOSY: 40.0000 mg | PREFILLED_SYRINGE | INTRAMUSCULAR | Status: DC

## 2024-08-02 MED ORDER — ROCURONIUM BROMIDE 10 MG/ML (PF) SYRINGE
PREFILLED_SYRINGE | INTRAVENOUS | Status: DC | PRN
  Administered 2024-08-02: 50 mg via INTRAVENOUS

## 2024-08-02 MED ORDER — ONDANSETRON HCL 4 MG/2ML IJ SOLN: 4.0000 mg | Freq: Once | INTRAMUSCULAR | Status: DC | PRN

## 2024-08-02 MED ORDER — ONDANSETRON HCL 4 MG/2ML IJ SOLN: 4.0000 mg | Freq: Four times a day (QID) | INTRAMUSCULAR | Status: DC | PRN

## 2024-08-02 MED ORDER — CHLORHEXIDINE GLUCONATE 0.12 % MT SOLN: 15.0000 mL | Freq: Once | OROMUCOSAL | Status: AC

## 2024-08-02 MED ORDER — AMISULPRIDE (ANTIEMETIC) 5 MG/2ML IV SOLN: 10.0000 mg | Freq: Once | INTRAVENOUS | Status: DC | PRN

## 2024-08-02 MED ORDER — LACTATED RINGERS IV SOLN: INTRAVENOUS | Status: DC

## 2024-08-02 MED ORDER — PIPERACILLIN-TAZOBACTAM 3.375 G IVPB 30 MIN
3.3750 g | Freq: Once | INTRAVENOUS | Status: AC
  Administered 2024-08-02: 3.375 g via INTRAVENOUS
  Filled 2024-08-02: qty 50

## 2024-08-02 MED ORDER — PANTOPRAZOLE SODIUM 40 MG IV SOLR
40.0000 mg | Freq: Two times a day (BID) | INTRAVENOUS | Status: DC
  Administered 2024-08-02 – 2024-08-10 (×19): 40 mg via INTRAVENOUS
  Filled 2024-08-02 (×16): qty 10

## 2024-08-02 MED ORDER — ACETAMINOPHEN 10 MG/ML IV SOLN
1000.0000 mg | Freq: Three times a day (TID) | INTRAVENOUS | Status: AC
  Administered 2024-08-02 – 2024-08-03 (×3): 1000 mg via INTRAVENOUS
  Filled 2024-08-02 (×3): qty 100

## 2024-08-02 MED ORDER — LIDOCAINE 2% (20 MG/ML) 5 ML SYRINGE
INTRAMUSCULAR | Status: AC
  Filled 2024-08-02: qty 5

## 2024-08-02 MED ORDER — PHENYLEPHRINE 80 MCG/ML (10ML) SYRINGE FOR IV PUSH (FOR BLOOD PRESSURE SUPPORT)
PREFILLED_SYRINGE | INTRAVENOUS | Status: DC | PRN
  Administered 2024-08-02 (×2): 240 ug via INTRAVENOUS

## 2024-08-02 MED ORDER — MORPHINE SULFATE (PF) 4 MG/ML IV SOLN
4.0000 mg | Freq: Once | INTRAVENOUS | Status: AC
  Administered 2024-08-02: 4 mg via INTRAVENOUS
  Filled 2024-08-02: qty 1

## 2024-08-02 MED ORDER — ORAL CARE MOUTH RINSE: 15.0000 mL | Freq: Once | OROMUCOSAL | Status: AC

## 2024-08-02 MED ORDER — FENTANYL CITRATE (PF) 250 MCG/5ML IJ SOLN
INTRAMUSCULAR | Status: DC | PRN
  Administered 2024-08-02: 50 ug via INTRAVENOUS

## 2024-08-02 MED ORDER — KETOROLAC TROMETHAMINE 15 MG/ML IJ SOLN
15.0000 mg | Freq: Once | INTRAMUSCULAR | Status: AC
  Administered 2024-08-02: 15 mg via INTRAVENOUS
  Filled 2024-08-02: qty 1

## 2024-08-02 MED ORDER — HYDROMORPHONE HCL 1 MG/ML IJ SOLN
1.0000 mg | Freq: Once | INTRAMUSCULAR | Status: AC
  Administered 2024-08-02: 1 mg via INTRAVENOUS
  Filled 2024-08-02: qty 1

## 2024-08-02 MED ORDER — FENTANYL CITRATE (PF) 250 MCG/5ML IJ SOLN
INTRAMUSCULAR | Status: AC
  Filled 2024-08-02: qty 5

## 2024-08-02 MED ORDER — CHLORHEXIDINE GLUCONATE 0.12 % MT SOLN
OROMUCOSAL | Status: AC
  Administered 2024-08-02: 15 mL via OROMUCOSAL
  Filled 2024-08-02: qty 15

## 2024-08-02 MED ORDER — ACETAMINOPHEN 10 MG/ML IV SOLN
INTRAVENOUS | Status: AC
  Filled 2024-08-02: qty 300

## 2024-08-02 MED ORDER — ASPIRIN 81 MG PO CHEW
324.0000 mg | CHEWABLE_TABLET | Freq: Once | ORAL | Status: AC
  Administered 2024-08-02: 324 mg via ORAL
  Filled 2024-08-02: qty 4

## 2024-08-02 MED ORDER — LACTATED RINGERS IV BOLUS
1000.0000 mL | Freq: Once | INTRAVENOUS | Status: AC
  Administered 2024-08-02: 1000 mL via INTRAVENOUS

## 2024-08-02 MED ORDER — SUCCINYLCHOLINE CHLORIDE 200 MG/10ML IV SOSY
PREFILLED_SYRINGE | INTRAVENOUS | Status: DC | PRN
  Administered 2024-08-02: 120 mg via INTRAVENOUS

## 2024-08-02 MED ORDER — HYDROMORPHONE HCL 1 MG/ML IJ SOLN: 0.2500 mg | INTRAMUSCULAR | Status: DC | PRN

## 2024-08-02 MED ORDER — ONDANSETRON HCL 4 MG/2ML IJ SOLN
INTRAMUSCULAR | Status: DC | PRN
  Administered 2024-08-02: 4 mg via INTRAVENOUS

## 2024-08-02 MED ORDER — SODIUM CHLORIDE 0.9 % IV SOLN: INTRAVENOUS | Status: DC

## 2024-08-02 MED ORDER — CHLORHEXIDINE GLUCONATE CLOTH 2 % EX PADS
6.0000 | MEDICATED_PAD | Freq: Every day | CUTANEOUS | Status: DC
  Administered 2024-08-03: 6 via TOPICAL

## 2024-08-02 MED ORDER — 0.9 % SODIUM CHLORIDE (POUR BTL) OPTIME
TOPICAL | Status: DC | PRN
  Administered 2024-08-02 (×5): 1000 mL

## 2024-08-02 MED ORDER — ALBUMIN HUMAN 5 % IV SOLN: INTRAVENOUS | Status: DC | PRN

## 2024-08-02 MED ORDER — ALBUMIN HUMAN 5 % IV SOLN
INTRAVENOUS | Status: AC
  Filled 2024-08-02: qty 250

## 2024-08-02 MED ORDER — ALBUMIN HUMAN 5 % IV SOLN
12.5000 g | Freq: Once | INTRAVENOUS | Status: AC
  Administered 2024-08-02: 12.5 g via INTRAVENOUS

## 2024-08-02 MED ORDER — SUGAMMADEX SODIUM 200 MG/2ML IV SOLN
INTRAVENOUS | Status: DC | PRN
  Administered 2024-08-02: 225.8 mg via INTRAVENOUS

## 2024-08-02 MED ORDER — ROCURONIUM BROMIDE 10 MG/ML (PF) SYRINGE
PREFILLED_SYRINGE | INTRAVENOUS | Status: AC
  Filled 2024-08-02: qty 10

## 2024-08-02 MED ORDER — LIDOCAINE 2% (20 MG/ML) 5 ML SYRINGE
INTRAMUSCULAR | Status: DC | PRN
  Administered 2024-08-02: 60 mg via INTRAVENOUS

## 2024-08-02 MED ORDER — PHENOL 1.4 % MT LIQD: 1.0000 | OROMUCOSAL | Status: DC | PRN

## 2024-08-02 MED ORDER — PANTOPRAZOLE SODIUM 40 MG IV SOLR: 40.0000 mg | Freq: Two times a day (BID) | INTRAVENOUS | Status: DC

## 2024-08-02 MED ORDER — FLUCONAZOLE IN SODIUM CHLORIDE 200-0.9 MG/100ML-% IV SOLN
200.0000 mg | INTRAVENOUS | Status: AC
  Administered 2024-08-02 – 2024-08-05 (×4): 200 mg via INTRAVENOUS
  Filled 2024-08-02 (×6): qty 100

## 2024-08-02 MED ORDER — PROPOFOL 10 MG/ML IV BOLUS
INTRAVENOUS | Status: DC | PRN
  Administered 2024-08-02: 100 mg via INTRAVENOUS

## 2024-08-02 MED ORDER — MORPHINE SULFATE (PF) 2 MG/ML IV SOLN: 2.0000 mg | INTRAVENOUS | Status: DC | PRN

## 2024-08-02 MED ORDER — HYDROMORPHONE HCL 1 MG/ML IJ SOLN
0.5000 mg | INTRAMUSCULAR | Status: DC | PRN
  Administered 2024-08-02 – 2024-08-05 (×7): 1 mg via INTRAVENOUS
  Filled 2024-08-02 (×7): qty 1

## 2024-08-02 MED ORDER — DEXAMETHASONE SODIUM PHOSPHATE 10 MG/ML IJ SOLN
INTRAMUSCULAR | Status: DC | PRN
  Administered 2024-08-02: 10 mg via INTRAVENOUS

## 2024-08-02 MED ORDER — PHENYLEPHRINE HCL-NACL 20-0.9 MG/250ML-% IV SOLN
INTRAVENOUS | Status: DC | PRN
  Administered 2024-08-02: 50 ug/min via INTRAVENOUS

## 2024-08-02 MED ORDER — SUCCINYLCHOLINE CHLORIDE 200 MG/10ML IV SOSY
PREFILLED_SYRINGE | INTRAVENOUS | Status: AC
  Filled 2024-08-02: qty 10

## 2024-08-02 MED ORDER — PROPOFOL 10 MG/ML IV BOLUS
INTRAVENOUS | Status: AC
Start: 2024-08-02 — End: 2024-08-02
  Filled 2024-08-02: qty 20

## 2024-08-02 MED ORDER — IOHEXOL 350 MG/ML SOLN
100.0000 mL | Freq: Once | INTRAVENOUS | Status: AC | PRN
  Administered 2024-08-02: 100 mL via INTRAVENOUS

## 2024-08-02 MED ORDER — PIPERACILLIN-TAZOBACTAM 3.375 G IVPB
3.3750 g | Freq: Three times a day (TID) | INTRAVENOUS | Status: AC
  Administered 2024-08-02 – 2024-08-07 (×15): 3.375 g via INTRAVENOUS
  Filled 2024-08-02 (×15): qty 50

## 2024-08-02 MED ORDER — DIPHENHYDRAMINE HCL 50 MG/ML IJ SOLN: 12.5000 mg | Freq: Four times a day (QID) | INTRAMUSCULAR | Status: DC | PRN

## 2024-08-02 SURGICAL SUPPLY — 48 items
BAG COUNTER SPONGE SURGICOUNT (BAG) ×2 IMPLANT
BIOPATCH RED 1 DISK 7.0 (GAUZE/BANDAGES/DRESSINGS) IMPLANT
BLADE CLIPPER SURG (BLADE) IMPLANT
BNDG GAUZE DERMACEA FLUFF 4 (GAUZE/BANDAGES/DRESSINGS) IMPLANT
CANISTER SUCTION 3000ML PPV (SUCTIONS) ×2 IMPLANT
CHLORAPREP W/TINT 26 (MISCELLANEOUS) ×2 IMPLANT
COVER SURGICAL LIGHT HANDLE (MISCELLANEOUS) ×2 IMPLANT
DERMABOND ADVANCED .7 DNX12 (GAUZE/BANDAGES/DRESSINGS) ×4 IMPLANT
DRAIN CHANNEL 19F RND (DRAIN) IMPLANT
DRAPE INCISE IOBAN 66X45 STRL (DRAPES) ×2 IMPLANT
DRAPE LAPAROSCOPIC ABDOMINAL (DRAPES) ×2 IMPLANT
DRAPE WARM FLUID 44X44 (DRAPES) ×2 IMPLANT
DRSG OPSITE POSTOP 4X10 (GAUZE/BANDAGES/DRESSINGS) IMPLANT
DRSG OPSITE POSTOP 4X8 (GAUZE/BANDAGES/DRESSINGS) IMPLANT
DRSG TEGADERM 4X4.75 (GAUZE/BANDAGES/DRESSINGS) IMPLANT
ELECT BLADE 6.5 EXT (BLADE) IMPLANT
ELECT CAUTERY BLADE 6.4 (BLADE) ×2 IMPLANT
ELECTRODE REM PT RTRN 9FT ADLT (ELECTROSURGICAL) ×2 IMPLANT
EVACUATOR SILICONE 100CC (DRAIN) IMPLANT
GAUZE PAD ABD 8X10 STRL (GAUZE/BANDAGES/DRESSINGS) IMPLANT
GAUZE SPONGE 2X2 STRL 8-PLY (GAUZE/BANDAGES/DRESSINGS) IMPLANT
GLOVE BIOGEL PI IND STRL 6 (GLOVE) ×2 IMPLANT
GLOVE BIOGEL PI MICRO STRL 5.5 (GLOVE) ×2 IMPLANT
GOWN STRL REUS W/ TWL LRG LVL3 (GOWN DISPOSABLE) ×4 IMPLANT
HANDLE SUCTION POOLE (INSTRUMENTS) ×2 IMPLANT
KIT BASIN OR (CUSTOM PROCEDURE TRAY) ×2 IMPLANT
KIT TURNOVER KIT B (KITS) ×2 IMPLANT
LIGASURE IMPACT 36 18CM CVD LR (INSTRUMENTS) IMPLANT
NS IRRIG 1000ML POUR BTL (IV SOLUTION) ×4 IMPLANT
PACK GENERAL/GYN (CUSTOM PROCEDURE TRAY) ×2 IMPLANT
PAD ARMBOARD POSITIONER FOAM (MISCELLANEOUS) ×2 IMPLANT
PENCIL SMOKE EVACUATOR (MISCELLANEOUS) ×2 IMPLANT
RETRACTOR WND ALEXIS 25 LRG (MISCELLANEOUS) IMPLANT
SLEEVE SUCTION CATH 165 (SLEEVE) ×2 IMPLANT
SPECIMEN JAR LARGE (MISCELLANEOUS) IMPLANT
SPONGE T-LAP 18X18 ~~LOC~~+RFID (SPONGE) IMPLANT
STAPLER SKIN PROX 35W (STAPLE) IMPLANT
SUT MNCRL AB 4-0 PS2 18 (SUTURE) ×4 IMPLANT
SUT PDS AB 1 TP1 96 (SUTURE) ×4 IMPLANT
SUT SILK 2 0 SH CR/8 (SUTURE) ×2 IMPLANT
SUT SILK 2 0 TIES 10X30 (SUTURE) ×2 IMPLANT
SUT SILK 3 0 SH CR/8 (SUTURE) ×2 IMPLANT
SUT SILK 3 0 TIES 10X30 (SUTURE) ×2 IMPLANT
SUT VIC AB 3-0 SH 18 (SUTURE) IMPLANT
SUT VIC AB 3-0 SH 27XBRD (SUTURE) ×4 IMPLANT
TOWEL GREEN STERILE (TOWEL DISPOSABLE) ×2 IMPLANT
TRAY FOLEY MTR SLVR 16FR STAT (SET/KITS/TRAYS/PACK) IMPLANT
YANKAUER SUCT BULB TIP NO VENT (SUCTIONS) IMPLANT

## 2024-08-02 NOTE — Anesthesia Postprocedure Evaluation (Signed)
 Anesthesia Post Note  Patient: [name] Sar  Procedure(s) Performed: LAPAROTOMY, EXPLORATORY GRAM PATCH REPAIR (Abdomen)     Patient location during evaluation: PACU Anesthesia Type: General Level of consciousness: awake and alert and oriented Pain management: pain level controlled Vital Signs Assessment: post-procedure vital signs reviewed and stable Respiratory status: spontaneous breathing, nonlabored ventilation, respiratory function stable and patient connected to nasal cannula oxygen Cardiovascular status: blood pressure returned to baseline and stable Postop Assessment: no apparent nausea or vomiting Anesthetic complications: no   No notable events documented.  Last Vitals:  Vitals:   08/02/24 1145 08/02/24 1200  BP: 100/70 96/73  Pulse: 99 94  Resp: 14 14  Temp:    SpO2: 99% 99%    Last Pain:  Vitals:   08/02/24 1200  TempSrc:   PainSc: Asleep                 Rogelio Winbush A.

## 2024-08-02 NOTE — ED Triage Notes (Addendum)
 Pt c/o chest pain radiating down left arm and neck, into back and abdomen. Pt states pain worsens when she breathes or moves. Pt states pain started approximately 1700 yesterday evening. Pt denies nausea or vomiting, states approximately 2 weeks ago she started having drainage down the back of her throat and feels nauseated, but nothing comes up. Pt states she has been short of breath, does not wear oxygen at home, 02 = 86% in triage. 3LNC applied.

## 2024-08-02 NOTE — ED Notes (Signed)
 Patient transported to X-ray

## 2024-08-02 NOTE — Consult Note (Addendum)
 Date: 08/02/2024               Patient Name:  [name] MRN: [id number]  DOB: [birth year] Age / Sex: 63 y.o., female   PCP: Card, Norleen SQUIBB, MD         Requesting Physician: Dr. Patsey Lot, MD    Consulting Reason:  Management of chronic conditions      Chief Complaint: Left side abdominal pain  History of Present Illness: [name] is a 63 y.o. with PMH of HTN, HLD, chronic back pain s/p L4-5 fusion, MS without recent flare, vitamin D  deficiency.  She presented to the ED for left-sided chest and abdominal pain that started yesterday evening.  Pain located mainly left upper quadrant under her breast.  Stated pain radiated up to her left shoulder and neck and also lower abdominal quadrants.  Denies any nausea or vomiting, fever or chills.  Endorses shortness of breath due to the pain.  Reports last bowel movement on Saturday and passing gas still.  No urinary symptoms.  Had normal appetite and no recent changes in medications or diet.  No history of this abdominal/chest pain in the past.  States no recent flareups for her MS, last flare reported 20+ years.  Denies any new symptoms including worsening fatigue, vision or cognitive changes today. Does take diclofenac twice daily for chronic pains.   Past Medical History:  Diagnosis Date   Arthritis    Back pain    Chronic knee pain    Edema of both lower extremities    High blood pressure    High cholesterol    Joint pain    Multiple sclerosis (HCC)    Neuromuscular disorder (HCC)    Multiple Sclerosis over 20 years   Obesity    Vitamin D  deficiency     Meds: -Aubagio  (teriflunomide ) 14 mg daily -Losartan  100 mg daily -Hydrochlorothiazide  25 mg daily -Modafinil  300 mg daily -Diclofenac 50 mg BID -Rosuvastatin  10 mg daily  -Gabapentin  600 mg BID  -Vitmain D Current Facility-Administered Medications  Medication Dose Route Frequency Provider Last Rate Last Admin   0.9 %  sodium chloride  infusion   Intravenous  Continuous Maczis, Ravina Milner M, PA-C       chlorhexidine  (PERIDEX ) 0.12 % solution            morphine  (PF) 2 MG/ML injection 2 mg  2 mg Intravenous Q3H PRN Maczis, Sonna Lipsky M, PA-C       ondansetron  (ZOFRAN ) injection 4 mg  4 mg Intravenous Q6H PRN Maczis, Vander [name] M, PA-C       pantoprazole  (PROTONIX ) injection 40 mg  40 mg Intravenous Q12H Maczis, Haidan Nhan M, PA-C       Current Outpatient Medications  Medication Sig Dispense Refill   Cholecalciferol  (VITAMIN D -3) 125 MCG (5000 UT) TABS Take 15,000 Units by mouth daily.     diclofenac (CATAFLAM) 50 MG tablet Take 50 mg by mouth 2 (two) times daily.     gabapentin  (NEURONTIN ) 300 MG capsule Take 300 mg by mouth 3 (three) times daily.     hydrochlorothiazide  (HYDRODIURIL ) 25 MG tablet Take 25 mg by mouth daily.     HYDROcodone -acetaminophen  (NORCO) 7.5-325 MG tablet Take 1 tablet by mouth every 4 (four) hours as needed for moderate pain or severe pain. 40 tablet 0   losartan  (COZAAR ) 100 MG tablet Take 100 mg by mouth daily.     modafinil  (PROVIGIL ) 200 MG tablet Take 200 mg by mouth See admin instructions. Morning  and Midday by 1400-1500     Multiple Vitamins-Minerals (MULTIVITAMIN WITH MINERALS) tablet Take 1 tablet by mouth daily. 55 +     rosuvastatin  (CRESTOR ) 10 MG tablet Take 10 mg by mouth at bedtime.      Teriflunomide  14 MG TABS Take 14 mg by mouth daily. Aubagio      tiZANidine  (ZANAFLEX ) 4 MG tablet Take 1 tablet (4 mg total) by mouth every 8 (eight) hours as needed for muscle spasms. (Patient not taking: Reported on 04/05/2021) 60 tablet 0    Allergies: Allergies as of 08/02/2024   (No Known Allergies)   Past Medical History:  Diagnosis Date   Arthritis    Back pain    Chronic knee pain    Edema of both lower extremities    High blood pressure    High cholesterol    Joint pain    Multiple sclerosis (HCC)    Neuromuscular disorder (HCC)    Multiple Sclerosis over 20 years   Obesity    Vitamin D  deficiency    Past Surgical  History:  Procedure Laterality Date   APPENDECTOMY     CESAREAN SECTION     HAND SURGERY     lumbar back surgery     LUMBAR WOUND DEBRIDEMENT N/A 12/05/2020   Procedure: Lumbar Wound Exploration;  Surgeon: Gillie Duncans, MD;  Location: [facility] OR;  Service: Neurosurgery;  Laterality: N/A;  3C/RM 21   LUMBAR WOUND DEBRIDEMENT N/A 02/21/2021   Procedure: Lumbar wound exploration;  Surgeon: Gillie Duncans, MD;  Location: [facility] OR;  Service: Neurosurgery;  Laterality: N/A;  posterior   LUMBAR WOUND DEBRIDEMENT N/A 04/11/2021   Procedure: Wound Exploration with Wound Vac Placement;  Surgeon: Gillie Duncans, MD;  Location: [facility] OR;  Service: Neurosurgery;  Laterality: N/A;   ROTATOR CUFF REPAIR     TONSILLECTOMY     Family History  Problem Relation Age of Onset   High blood pressure Mother    High Cholesterol Mother    Thyroid disease Mother    Cancer Mother    High blood pressure Father    High Cholesterol Father    Heart disease Father    Obesity Father    Social History   Socioeconomic History   Marital status: Single    Spouse name: Not on file   Number of children: Not on file   Years of education: Not on file   Highest education level: Not on file  Occupational History   Occupation: work from Manufacturing systems engineer call center  Tobacco Use   Smoking status: Never   Smokeless tobacco: Never  Vaping Use   Vaping status: Never Used  Substance and Sexual Activity   Alcohol use: Yes    Comment: social (1 time a month)   Drug use: No   Sexual activity: Not on file  Other Topics Concern   Not on file  Social History Narrative   Not on file   Social Drivers of Health   Financial Resource Strain: Low Risk  (04/01/2024)   Received from [facility]   Overall Financial Resource Strain (CARDIA)    Difficulty of Paying Living Expenses: Not very hard  Food Insecurity: No Food Insecurity (04/01/2024)   Received from [facility]   Hunger Vital Sign    Within the past 12 months, you worried that  your food would run out before you got the money to buy more.: Never true    Within the past 12 months, the food you bought just  didn't last and you didn't have money to get more.: Never true  Transportation Needs: No Transportation Needs (04/01/2024)   Received from [facility]   PRAPARE - Transportation    Lack of Transportation (Medical): No    Lack of Transportation (Non-Medical): No  Physical Activity: Unknown (04/01/2024)   Received from [facility]   Exercise Vital Sign    On average, how many days per week do you engage in moderate to strenuous exercise (like a brisk walk)?: 0 days    Minutes of Exercise per Session: Not on file  Stress: No Stress Concern Present (04/01/2024)   Received from [name] - Occupational Stress Questionnaire    Feeling of Stress : Not at all  Social Connections: Moderately Integrated (04/01/2024)   Received from [facility]   Social Network    How would you rate your social network (family, work, friends)?: Adequate participation with social networks  Intimate Partner Violence: Not At Risk (04/01/2024)   Received from [facility]   HITS    Over the last 12 months how often did your partner physically hurt you?: Never    Over the last 12 months how often did your partner insult you or talk down to you?: Never    Over the last 12 months how often did your partner threaten you with physical harm?: Never    Over the last 12 months how often did your partner scream or curse at you?: Never    Review of Systems: Pertinent items are noted in HPI.  Physical Exam: Blood pressure (!) 143/107, pulse (!) 106, temperature 98.3 F (36.8 C), resp. rate 17, height 5' 3 (1.6 m), weight 112.9 kg, SpO2 98%. Physical Exam Constitutional:      General: She is not in acute distress. Cardiovascular:     Rate and Rhythm: Regular rhythm. Tachycardia present.  Pulmonary:     Effort: Pulmonary effort is normal. No  respiratory distress.     Breath sounds: Normal breath sounds.     Comments: On 2L [place].  Abdominal:     General: Bowel sounds are decreased. There is distension.     Tenderness: There is abdominal tenderness in the epigastric area, left upper quadrant and left lower quadrant. There is no guarding.  Musculoskeletal:     Right lower leg: No edema.     Left lower leg: No edema.  Skin:    General: Skin is warm and dry.  Neurological:     Mental Status: She is alert and oriented to person, place, and time.     Lab results: Lab Results  Component Value Date   WBC 13.4 (H) 08/02/2024   HGB 11.1 (L) 08/02/2024   HCT 35.3 (L) 08/02/2024   MCV 90.5 08/02/2024   PLT 327 08/02/2024   Last metabolic panel Lab Results  Component Value Date   GLUCOSE 139 (H) 08/02/2024   NA 136 08/02/2024   K 3.7 08/02/2024   CL 99 08/02/2024   CO2 26 08/02/2024   BUN 18 08/02/2024   CREATININE 0.99 08/02/2024   GFRNONAA >60 08/02/2024   CALCIUM  9.2 08/02/2024   PROT 6.9 08/02/2024   ALBUMIN  3.2 (L) 08/02/2024   LABGLOB 2.2 01/17/2020   AGRATIO 1.9 01/17/2020   BILITOT 0.5 08/02/2024   ALKPHOS 82 08/02/2024   AST 18 08/02/2024   ALT 15 08/02/2024   ANIONGAP 11 08/02/2024   Troponin normal 7>>8 D-dimer 4.42 Lipase 24  Imaging results:  DG Chest 2 View Addendum Date: 08/02/2024 ADDENDUM REPORT: 08/02/2024 07:06 ADDENDUM: Critical Value/emergent results were called by telephone at the time of interpretation on 08/02/2024 at 0559 to provider [facility] , who verbally acknowledged these results. Electronically Signed   By: Camellia Candle M.D.   On: 08/02/2024 07:06   Result Date: 08/02/2024 CLINICAL DATA:  Chest pain EXAM: CHEST - 2 VIEW COMPARISON:  12/17/2013 FINDINGS: Low lung volumes. There is pulmonary vascular congestion without overt pulmonary edema. The cardio pericardial silhouette is enlarged. Lucency identified under each hemidiaphragm, raising concern for intraperitoneal free air. Bones  are diffusely demineralized. IMPRESSION: 1. Lucency under each hemidiaphragm, concerning for intraperitoneal free air. CT abdomen and pelvis with IV contrast recommended to further evaluate. 2. Low volume film with pulmonary vascular congestion. Electronically Signed: By: Camellia Candle M.D. On: 08/02/2024 05:40   CT Angio Chest/Abd/Pel for Dissection W and/or Wo Contrast Result Date: 08/02/2024 CLINICAL DATA:  Pain.  Acute aortic syndrome suspected. EXAM: CT ANGIOGRAPHY CHEST, ABDOMEN AND PELVIS TECHNIQUE: Non-contrast CT of the chest was initially obtained. Multidetector CT imaging through the chest, abdomen and pelvis was performed using the standard protocol during bolus administration of intravenous contrast. Multiplanar reconstructed images and MIPs were obtained and reviewed to evaluate the vascular anatomy. RADIATION DOSE REDUCTION: This exam was performed according to the departmental dose-optimization program which includes automated exposure control, adjustment of the mA and/or kV according to patient size and/or use of iterative reconstruction technique. CONTRAST:  OMNIPAQUE  IOHEXOL  350 MG/ML SOLN COMPARISON:  None Available. FINDINGS: CTA CHEST FINDINGS Cardiovascular: Pre contrast imaging shows no hyperdense crescent in the wall of the thoracic aorta to suggest the presence of an acute intramural hematoma. Imaging after IV contrast administration shows no dissection of the thoracic aorta. No thoracic aortic aneurysm. No large central pulmonary embolus. Mediastinum/Nodes: No mediastinal lymphadenopathy. There is no hilar lymphadenopathy. Moderate hiatal hernia. The esophagus has normal imaging features. There is no axillary lymphadenopathy. Lungs/Pleura: No suspicious pulmonary nodule or mass. No focal airspace consolidation. There is no evidence of pleural effusion. Down No worrisome lytic or sclerotic osseous abnormality. Musculoskeletal: No worrisome lytic or sclerotic osseous abnormality.  Review of the MIP images confirms the above findings. CTA ABDOMEN AND PELVIS FINDINGS VASCULAR Aorta: Normal caliber aorta without aneurysm, dissection, vasculitis or significant stenosis. There is moderate atherosclerotic calcification of the abdominal aorta without aneurysm. Celiac: Patent without evidence of aneurysm, dissection, vasculitis or significant stenosis. SMA: Patent without evidence of aneurysm, dissection, vasculitis or significant stenosis. Renals: Both renal arteries are patent without evidence of aneurysm, dissection, vasculitis, fibromuscular dysplasia or significant stenosis. Accessory right renal artery. IMA: Proximal stenosis not excluded. Inflow: Patent without evidence of aneurysm, dissection, vasculitis or significant stenosis. Veins: No obvious venous abnormality within the limitations of this arterial phase study. Review of the MIP images confirms the above findings. NON-VASCULAR Hepatobiliary: No suspicious focal abnormality within the liver parenchyma. Gallbladder is distended without substantial pericholecystic edema or fluid. No intrahepatic or extrahepatic biliary dilation. Pancreas: No focal mass lesion. No dilatation of the main duct. No intraparenchymal cyst. No peripancreatic edema. Spleen: No splenomegaly. No suspicious focal mass lesion. Adrenals/Urinary Tract: No adrenal nodule or mass. Kidneys unremarkable. No evidence for hydroureter. The urinary bladder appears normal for the degree of distention. Stomach/Bowel: Gastric wall appears thickened ill-defined with perigastric edema evident. Duodenum is normally positioned as is the ligament of Treitz. No small bowel wall thickening. No small bowel dilatation. Is the terminal ileum nor the appendix are  well visualized. No gross colonic mass. No colonic wall thickening. Lymphatic: There is no gastrohepatic or hepatoduodenal ligament lymphadenopathy. No retroperitoneal or mesenteric lymphadenopathy. No pelvic sidewall  lymphadenopathy. Reproductive: There is no adnexal mass. Other: No intraperitoneal free fluid. Intraperitoneal free air is identified, predominantly in the abdomen. Musculoskeletal: No worrisome lytic or sclerotic osseous abnormality. Posterior fusion hardware noted L4-5. The pedicle screws bilaterally at L4 exit the superior cortex of each pedicle extending to the posterior L3-4 disc space. Review of the MIP images confirms the above findings. IMPRESSION: 1. No evidence for acute intramural hematoma or dissection of the thoracoabdominal aorta. No thoracoabdominal aortic aneurysm. 2. Intraperitoneal free air, predominantly in the abdomen. Gastric wall appears thickened and ill-defined with perigastric edema evident. Although the precise source of the intraperitoneal free air is not evident on the study, gastric origin is favored. 3. Moderate hiatal hernia. Findings of free air were communicated to Dr. Carita after interpretation of the patient's chest x-ray earlier today at 0559 hours. Electronically Signed   By: Camellia Candle M.D.   On: 08/02/2024 07:06    Other results: EKG: sinus tachycardia with rate 109, incomplete RBBB noted on prior EKG.   Assessment, Plan, & Recommendations by Problem: Active Problems:   Pneumoperitoneum   Jabree C Ferraiolo is a 63 y.o. with PMH of HTN, HLD, chronic back pain s/p L4-5 fusion, MS without recent flare, vitamin D  deficiency presented with left sided chest and abdominal pain admitted by general surgery team for pneumoperitoneum c/f perforated viscus.  #Pneumoperitoneum c/f perforated viscus #Abdominal Pain #Tachycardia  #Leukocytosis Surgery admitted and patient was taken to emergent OR today.  CT concerning for free air possibly gastric etiology.  Received Zosyn  in the ED.  Afebrile but noted leukocytosis.  History of chronic NSAID use for chronic pains. No longer on Wegovy since earlier of this year. Less suspicious for cardiac/ACS leading to chest pain given  negative troponin and no acute ischemic changes on EKG. Suspect tachycardia in setting of perforated viscus s/p IVF and Abx in ED.  - General surgery is primary, follow-up OR findings and postop recs - Pain management per primary team - Continue IVF per primary  - Zosyn  started in ED - Continue IV PPI BID  - NPO   #HTN - Hold home losartan  100 mg and hydrochlorothiazide  25 mg in setting of normotensive readings and impending surgery.  Will consider restarting after if BP tolerates.   #HLD - Continue home rosuvastatin  after postop  #Multiple sclerosis Patient denies any recent flare with last flareup 20+ years.  No acute concerns of symptoms at this time. She is adherent to her teriflunomide , last taken last night 8/3.  Will resume after postop.  She follows with neurology outpatient. On modafinil  for fatigue. Hepatic function panel normal.  - Continue home teriflunomide  14 mg daily  - Continue home modafinil  300 mg daily   #Chronic back pain Hx of L4-5 fusion. Follows with [facility]. PTA reports taking diclofenac BID and gabapentin  BID for chronic pains.  - Gabapentin  600 mg twice daily - Stop NSAID use - Postop pain regimen per primary team   #Normocytic anemia Noted in past CBC. BL around 11. Unclear of etiology, does have hx of chronic NSAID use and coming in for possible perforation. Will obtain labs including B12, folate, iron studies. Denies sign of bleeding at this time.  - Trend CBC and follow up anemia labs     We will follow with you.   Signed:  Ahmiya Abee, DO Internal Medicine Resident PGY-3 Please contact the on-call pager after 5 pm and on weekends at [phone]. 08/02/2024, 8:35 AM

## 2024-08-02 NOTE — Progress Notes (Signed)
 HD#0 SUBJECTIVE:  Patient Summary:[name] is a 63 y.o. with PMH of HTN, HLD, chronic back pain s/p L4-5 fusion, MS without recent flare, vitamin D  deficiency.  She presented to the ED for left-sided chest and abdominal pain that started yesterday evening.  Pain located mainly left upper quadrant under her breast.  Stated pain radiated up to her left shoulder and neck and also lower abdominal quadrants.  Denies any nausea or vomiting, fever or chills.  Endorses shortness of breath due to the pain.  Reports last bowel movement on Saturday and passing gas still.  No urinary symptoms.  Had normal appetite and no recent changes in medications or diet.  No history of this abdominal/chest pain in the past.  States no recent flareups for her MS, last flare reported 20+ years.  Denies any new symptoms including worsening fatigue, vision or cognitive changes today. Does take diclofenac twice daily for chronic pains.   Overnight Events:  Aside from pain, the patient denies any new symptoms.  Interim History:  The patient did not report any new concerns other than severe pain.  OBJECTIVE:  Vital Signs: Vitals:   08/02/24 1415 08/02/24 1430 08/02/24 1500 08/02/24 1543  BP: 110/76 120/75 112/80 108/74  Pulse: (!) 102 98 (!) 105 98  Resp: 18 14 16 18   Temp:    98.4 F (36.9 C)  TempSrc:    Oral  SpO2: 93% 97% 95% 100%  Weight:      Height:       Supplemental O2: Nasal Cannula SpO2: 100 % O2 Flow Rate (L/min): 2 L/min  Filed Weights   08/02/24 0420 08/02/24 0848  Weight: 112.9 kg 112.9 kg     Intake/Output Summary (Last 24 hours) at 08/02/2024 1827 Last data filed at 08/02/2024 1500 Gross per 24 hour  Intake 1750 ml  Output 455 ml  Net 1295 ml   Net IO Since Admission: 1,295 mL [08/02/24 1827]  Physical Exam: Physical Exam General appearance: alert, cooperative, fatigued, and no distress Neck: no adenopathy and supple, symmetrical, trachea midline Resp: clear to auscultation  bilaterally Cardio: regular rate and rhythm GI: normal findings: soft, non-tender and abnormal findings:  hypoactive bowel sounds Extremities: edema none Abd: Soft, mildly distended with upper abdominal tenderness; no rigidity or guarding. Some bowel sounds. Midline wound clean.   Patient Lines/Drains/Airways Status     Active Line/Drains/Airways     Name Placement date Placement time Site Days   Peripheral IV 08/02/24 20 G Anterior;Proximal;Right Forearm 08/02/24  0536  Forearm  less than 1   Peripheral IV 08/02/24 18 G Left;Posterior Hand 08/02/24  --  Hand  less than 1   Peripheral IV 08/02/24 14 G  Anterior;Right;Upper Arm 08/02/24  0930  Arm  less than 1   Peripheral IV 08/02/24 14 G  1.75 Right Antecubital 08/02/24  0925  Antecubital  less than 1   Closed System Drain 1 Right Abdomen Bulb (JP) 19 Fr. 08/02/24  1034  Abdomen  less than 1   Negative Pressure Wound Therapy Back 04/11/21  1453  --  1209   NG/OG Vented/Dual Lumen 16 Fr. Right nare External length of tube 58 cm 08/02/24  1108  Right nare  less than 1   Urethral Catheter H. Sherrine, RN Latex;Straight-tip 16 Fr. 08/02/24  0953  Latex;Straight-tip  less than 1   Wound 08/02/24 1018 Surgical Closed Surgical Incision Abdomen Other (Comment) 08/02/24  1018  Abdomen  less than 1  Pertinent labs and imaging:      Latest Ref Rng & Units 08/02/2024    4:27 AM 04/11/2021    6:00 PM 04/11/2021   11:19 AM  CBC  WBC 4.0 - 10.5 K/uL 13.4  4.4  4.4   Hemoglobin 12.0 - 15.0 g/dL 88.8  88.7  88.4   Hematocrit 36.0 - 46.0 % 35.3  35.4  37.2   Platelets 150 - 400 K/uL 327  228  229        Latest Ref Rng & Units 08/02/2024    5:32 AM 08/02/2024    4:27 AM 04/11/2021    6:00 PM  CMP  Glucose 70 - 99 mg/dL  860    BUN 8 - 23 mg/dL  18    Creatinine 9.55 - 1.00 mg/dL  9.00  9.30   Sodium 864 - 145 mmol/L  136    Potassium 3.5 - 5.1 mmol/L  3.7    Chloride 98 - 111 mmol/L  99    CO2 22 - 32 mmol/L  26    Calcium  8.9 -  10.3 mg/dL  9.2    Total Protein 6.5 - 8.1 g/dL 6.9     Total Bilirubin 0.0 - 1.2 mg/dL 0.5     Alkaline Phos 38 - 126 U/L 82     AST 15 - 41 U/L 18     ALT 0 - 44 U/L 15       DG Chest 2 View Addendum Date: 08/02/2024 ADDENDUM REPORT: 08/02/2024 07:06 ADDENDUM: Critical Value/emergent results were called by telephone at the time of interpretation on 08/02/2024 at 0559 to provider [facility] , who verbally acknowledged these results. Electronically Signed   By: Camellia Candle M.D.   On: 08/02/2024 07:06   Result Date: 08/02/2024 CLINICAL DATA:  Chest pain EXAM: CHEST - 2 VIEW COMPARISON:  12/17/2013 FINDINGS: Low lung volumes. There is pulmonary vascular congestion without overt pulmonary edema. The cardio pericardial silhouette is enlarged. Lucency identified under each hemidiaphragm, raising concern for intraperitoneal free air. Bones are diffusely demineralized. IMPRESSION: 1. Lucency under each hemidiaphragm, concerning for intraperitoneal free air. CT abdomen and pelvis with IV contrast recommended to further evaluate. 2. Low volume film with pulmonary vascular congestion. Electronically Signed: By: Camellia Candle M.D. On: 08/02/2024 05:40   CT Angio Chest/Abd/Pel for Dissection W and/or Wo Contrast Result Date: 08/02/2024 CLINICAL DATA:  Pain.  Acute aortic syndrome suspected. EXAM: CT ANGIOGRAPHY CHEST, ABDOMEN AND PELVIS TECHNIQUE: Non-contrast CT of the chest was initially obtained. Multidetector CT imaging through the chest, abdomen and pelvis was performed using the standard protocol during bolus administration of intravenous contrast. Multiplanar reconstructed images and MIPs were obtained and reviewed to evaluate the vascular anatomy. RADIATION DOSE REDUCTION: This exam was performed according to the departmental dose-optimization program which includes automated exposure control, adjustment of the mA and/or kV according to patient size and/or use of iterative reconstruction technique.  CONTRAST:  OMNIPAQUE  IOHEXOL  350 MG/ML SOLN COMPARISON:  None Available. FINDINGS: CTA CHEST FINDINGS Cardiovascular: Pre contrast imaging shows no hyperdense crescent in the wall of the thoracic aorta to suggest the presence of an acute intramural hematoma. Imaging after IV contrast administration shows no dissection of the thoracic aorta. No thoracic aortic aneurysm. No large central pulmonary embolus. Mediastinum/Nodes: No mediastinal lymphadenopathy. There is no hilar lymphadenopathy. Moderate hiatal hernia. The esophagus has normal imaging features. There is no axillary lymphadenopathy. Lungs/Pleura: No suspicious pulmonary nodule or mass. No focal airspace consolidation. There is  no evidence of pleural effusion. Down No worrisome lytic or sclerotic osseous abnormality. Musculoskeletal: No worrisome lytic or sclerotic osseous abnormality. Review of the MIP images confirms the above findings. CTA ABDOMEN AND PELVIS FINDINGS VASCULAR Aorta: Normal caliber aorta without aneurysm, dissection, vasculitis or significant stenosis. There is moderate atherosclerotic calcification of the abdominal aorta without aneurysm. Celiac: Patent without evidence of aneurysm, dissection, vasculitis or significant stenosis. SMA: Patent without evidence of aneurysm, dissection, vasculitis or significant stenosis. Renals: Both renal arteries are patent without evidence of aneurysm, dissection, vasculitis, fibromuscular dysplasia or significant stenosis. Accessory right renal artery. IMA: Proximal stenosis not excluded. Inflow: Patent without evidence of aneurysm, dissection, vasculitis or significant stenosis. Veins: No obvious venous abnormality within the limitations of this arterial phase study. Review of the MIP images confirms the above findings. NON-VASCULAR Hepatobiliary: No suspicious focal abnormality within the liver parenchyma. Gallbladder is distended without substantial pericholecystic edema or fluid. No  intrahepatic or extrahepatic biliary dilation. Pancreas: No focal mass lesion. No dilatation of the main duct. No intraparenchymal cyst. No peripancreatic edema. Spleen: No splenomegaly. No suspicious focal mass lesion. Adrenals/Urinary Tract: No adrenal nodule or mass. Kidneys unremarkable. No evidence for hydroureter. The urinary bladder appears normal for the degree of distention. Stomach/Bowel: Gastric wall appears thickened ill-defined with perigastric edema evident. Duodenum is normally positioned as is the ligament of Treitz. No small bowel wall thickening. No small bowel dilatation. Is the terminal ileum nor the appendix are well visualized. No gross colonic mass. No colonic wall thickening. Lymphatic: There is no gastrohepatic or hepatoduodenal ligament lymphadenopathy. No retroperitoneal or mesenteric lymphadenopathy. No pelvic sidewall lymphadenopathy. Reproductive: There is no adnexal mass. Other: No intraperitoneal free fluid. Intraperitoneal free air is identified, predominantly in the abdomen. Musculoskeletal: No worrisome lytic or sclerotic osseous abnormality. Posterior fusion hardware noted L4-5. The pedicle screws bilaterally at L4 exit the superior cortex of each pedicle extending to the posterior L3-4 disc space. Review of the MIP images confirms the above findings. IMPRESSION: 1. No evidence for acute intramural hematoma or dissection of the thoracoabdominal aorta. No thoracoabdominal aortic aneurysm. 2. Intraperitoneal free air, predominantly in the abdomen. Gastric wall appears thickened and ill-defined with perigastric edema evident. Although the precise source of the intraperitoneal free air is not evident on the study, gastric origin is favored. 3. Moderate hiatal hernia. Findings of free air were communicated to Dr. Carita after interpretation of the patient's chest x-ray earlier today at 0559 hours. Electronically Signed   By: Camellia Candle M.D.   On: 08/02/2024 07:06     ASSESSMENT/PLAN:  Assessment: Principal Problem:   Perforated gastric ulcer (HCC) Active Problems:   Perforated abdominal viscus   Plan: REATA PETROV is a 63 y.o. with PMH of HTN, HLD, chronic back pain s/p L4-5 fusion, MS without recent flare, vitamin D  deficiency presented with left sided chest and abdominal pain admitted by general surgery team for pneumoperitoneum c/f perforated viscus.A CT scan showed free intraabdominal air with gastric wall thickening. After a discussion of the risks and benefits of surgery, she agreed to proceed with abdominal exploration. Infection Present At Time Of Surgery (PATOS)?  Purulent peritonitis     #Pneumoperitoneum c/f perforated viscus #Abdominal Pain #Tachycardia  #Leukocytosis #Pneumoperitoneum c/f perforated viscus The patient presents with pneumoperitoneum likely secondary to a perforated viscus, suspected to be of gastric origin given the clinical presentation and CT findings. She has abdominal pain, leukocytosis, and tachycardia, likely due to the acute infectious and inflammatory process. She has a history  of chronic NSAID use, which may have contributed to gastric ulceration and perforation. Cardiac causes for her symptoms are unlikely given negative troponin and EKG without ischemic changes. The patient was taken emergently to the OR for surgical repair and is currently in the postoperative period with ongoing management. WBC increased from 13 to 20, likely reflecting a postoperative response.JP drain with 170 cc bloody serosanguinous fluid in 24 hrs, some clot/sediment noted. Plan:  - Continue general surgery management; monitor postoperative course and pathology results. - Maintain nasogastric tube to low intermittent wall suction (LIWS). - Plan upper GI study on postoperative day 3 to assess for leaks. - Continue IV antibiotics: Zosyn  and Fluconazole  for 5 days post-op. - Monitor Jackson-Pratt drain output; currently bloody  serosanguinous. - Administer IV proton pump inhibitors twice daily. - Test for H. pylori. - Provide wound care with BID dressings; plan VAC therapy if wound remains stable. - Use multimodal pain control; avoid NSAIDs. - Encourage mobilization and physical therapy. - Perform pulmonary toilet and wean oxygen as tolerated. - Maintain NPO status until further evaluation. - Continue IV fluids as per primary team. - Foley catheter removed and patient tolerated well. -  #Hemoglobin drop Hemoglobin decreased from 11.1 to 7.9 postoperatively, possibly related to surgical blood loss, though intraoperative bleeding was minimal. The abdomen is tender without guarding, and there is no evidence of active bleeding from the surgical site.  No current tachycardia or hypotension.  - Hold AM dose of Lovenox . - Repeat CBC this PM. - Check hemoglobin regularly #Normocytic anemia Noted in past CBC. BL around 11. Unclear of etiology, does have hx of chronic NSAID use and coming in for possible perforation. Will obtain labs including B12, folate, iron studies. Denies sign of bleeding at this time.  - Trend CBC and follow up anemia labs   #HTN Average blood pressure is 120/80. - Hold home losartan  100 mg and hydrochlorothiazide  25 mg in setting of normotensive readings and surgery.  Will consider restarting after if BP tolerates.  #HLD - Hold home rosuvastatin   #Multiple sclerosis Patient denies any recent flare with last flareup 20+ years.  No acute concerns of symptoms at this time. She is adherent to her teriflunomide , last taken last night 8/3.  Will resume after postop.  She follows with neurology outpatient. On modafinil  for fatigue. Hepatic function panel normal.  - Hold home teriflunomide  14 mg daily (immunomodulator and delay improvement) - hold  home modafinil  300 mg daily (increase HR andBP so right now hold) #Chronic back pain Hx of L4-5 fusion. Follows with [facility]. PTA  reports taking diclofenac BID and gabapentin  BID for chronic pains.  - robaxin  500 iv q8 - Dilaudid  Q2h PRN - Stop NSAID use - Per surgery: Multimodal pain control. Avoid NSAIDs       Best Practice: Diet: NPO IVF: Fluids: LR, Rate: 75 cc/ h VTE: enoxaparin  (LOVENOX ) injection 40 mg Start: 08/03/24 0800 SCDs Start: 08/02/24 1542 Code: Full  Disposition planning: Family Contact: Son, called and notified. DISPO: Anticipated discharge in 3 days to Home pending clinical improvement.  Signature:  Ajee Heasley [facility]  6:27 PM, 08/02/2024  On Call pager [phone]

## 2024-08-02 NOTE — Op Note (Signed)
 Date: 08/02/24  Patient: [name] MRN: [id number]  Preoperative Diagnosis: Pneumoperitoneum Postoperative Diagnosis: Perforated gastric ulcer  Procedure: Exploratory laparotomy, Arlyss patch repair of perforated gastric ulcer  Surgeon: Leonor Dawn, MD Assistant: Eva Barrier, MD  EBL: Minimal  Anesthesia: General endotracheal  Specimens: Gastric ulcer (biopsy)  Indications: [name] is a 63 yo female with chronic NSAID use who presented to the ED with several days of worsening abdominal pain. A CT scan showed free intraabdominal air with gastric wall thickening. After a discussion of the risks and benefits of surgery, she agreed to proceed with abdominal exploration.  Findings: Perforated ulcer on the anterior distal gastric body, repaired with a Arlyss patch.  Procedure details: Informed consent was obtained in the preoperative area prior to the procedure. The patient was brought to the operating room and placed on the table in the supine position. General anesthesia was induced and appropriate lines and drains were placed for intraoperative monitoring. Perioperative antibiotics were administered per SCIP guidelines. The abdomen was prepped and draped in the usual sterile fashion. A pre-procedure timeout was taken verifying patient identity, surgical site and procedure to be performed.  An upper midline skin incision was made, the subcutaneous tissue was divided with cautery, and the fascia was elevated and opened along the linea alba. On entry into the abdomen there was a moderate amount of turbid free fluid. The falciform ligament was ligated with 2-0 silk ties, divided, and taken down off the abdominal wall with cautery. An Banker were placed. There was inflammatory rind on the anterior surface of the stomach, with free fluid in the upper abdomen. The fluid was evacuated with suction. The anterior gastric body was thickened with overlying  inflammatory rind, but the site of perforation was not immediately visible. The lesser sac was opened by dividing the gastrocolic omentum with cautery. There was no fluid in the lesser sac, and the posterior gastric wall was soft and normal. The anterior stomach was again examined. The area of concern was very thickened, and on exam a very small full-thickness perforation was identified at the center of the thickened area. A scalpel was used to sharply excise a piece of adjacent tissue for biopsy, which was sent for routine pathology. The remainder of the stomach was soft. The abdomen was copiously irrigated with warmed saline. There was a tongue of omentum that was freely mobile, and this was utilized for a [employer]. Interrupted 3-0 silk sutures were placed across the site of the gastric perforation, and the tongue of omentum was laid underneath the sutures prior to tying the sutures down. The abdomen was again irrigated. The repair site appeared fully in tact with no signs of leakage. The NG tube tip was palpated in appropriate position within the lumen of the stomach. A 19-Fr JP drain was placed adjacent to the gastric repair site, brought out through the RLQ abdominal wall, and secured to the skin with 2-0 Nylon suture. The retractors and wound protector were removed. The fascia was closed with a running looped 1 PDS suture, and the skin was left open and packed with saline-moistened kerlex.  Upon entering the abdomen (organ space), I encountered purulent ascites.  CASE DATA:  Type of patient?: DOW CASE ([facility])  Status of Case? EMERGENT Add On  Infection Present At Time Of Surgery (PATOS)?  Purulent peritonitis   The patient tolerated the procedure well with no apparent complications. All counts were correct x2 at the  end of the procedure. The patient was extubated and taken to PACU in stable condition.  Leonor Dawn, MD 08/02/24 11:13 AM

## 2024-08-02 NOTE — Plan of Care (Signed)
   Problem: Health Behavior/Discharge Planning: Goal: Ability to manage health-related needs will improve Outcome: Progressing   Problem: Clinical Measurements: Goal: Ability to maintain clinical measurements within normal limits will improve Outcome: Progressing Goal: Will remain free from infection Outcome: Progressing

## 2024-08-02 NOTE — Anesthesia Procedure Notes (Signed)
 Procedure Name: Intubation Date/Time: 08/02/2024 9:50 AM  Performed by: Zelphia Norleen HERO, CRNAPre-anesthesia Checklist: Patient identified, Emergency Drugs available, Suction available and Patient being monitored Patient Re-evaluated:Patient Re-evaluated prior to induction Oxygen Delivery Method: Circle system utilized Preoxygenation: Pre-oxygenation with 100% oxygen Induction Type: IV induction, Rapid sequence and Cricoid Pressure applied Ventilation: Mask ventilation without difficulty Laryngoscope Size: Mac and 3 Grade View: Grade III Tube type: Oral Tube size: 7.0 mm Number of attempts: 1 Airway Equipment and Method: Stylet Placement Confirmation: ETT inserted through vocal cords under direct vision, positive ETCO2 and breath sounds checked- equal and bilateral Secured at: 2 cm Tube secured with: Tape Dental Injury: Teeth and Oropharynx as per pre-operative assessment

## 2024-08-02 NOTE — Transfer of Care (Signed)
 Immediate Anesthesia Transfer of Care Note  Patient: [name] Sar  Procedure(s) Performed: LAPAROTOMY, EXPLORATORY GRAM PATCH REPAIR (Abdomen)  Patient Location: PACU  Anesthesia Type:General  Level of Consciousness: awake, alert , and oriented  Airway & Oxygen Therapy: Patient Spontanous Breathing and Patient connected to nasal cannula oxygen  Post-op Assessment: Report given to RN and Post -op Vital signs reviewed and stable  Post vital signs: Reviewed and stable  Last Vitals:  Vitals Value Taken Time  BP 111/57 08/02/24 11:10  Temp 98   Pulse 104 08/02/24 11:14  Resp 16 08/02/24 11:14  SpO2 92 % 08/02/24 11:14  Vitals shown include unfiled device data.  Last Pain:  Vitals:   08/02/24 0903  TempSrc:   PainSc: 5          Complications: No notable events documented.

## 2024-08-02 NOTE — Progress Notes (Signed)
 Called for report x2 for patient's surgery. Unable to reach anyone. Transport sent for patient.

## 2024-08-02 NOTE — ED Notes (Signed)
 Pt transported to CT ?

## 2024-08-02 NOTE — ED Provider Notes (Signed)
  Physical Exam  BP (!) 143/107   Pulse (!) 106   Temp 98.3 F (36.8 C)   Resp 17   Ht 5' 3 (1.6 m)   Wt 112.9 kg   SpO2 98%   BMI 44.11 kg/m   Physical Exam  Procedures  Procedures  ED Course / MDM    Medical Decision Making Amount and/or Complexity of Data Reviewed Labs: ordered. Radiology: ordered.  Risk OTC drugs. Prescription drug management. Decision regarding hospitalization.   Patient with perforated viscus.  Well-appearing.  Had discussed with general surgery last night.  I called this morning after CT scan results come back.    Likely OR today.  However requested medicine consult for medical management but will stay on general surgery service initially.        Patsey Lot, MD 08/02/24 [phone]

## 2024-08-02 NOTE — ED Notes (Signed)
 CCMD notified. Pt is on monitor.

## 2024-08-02 NOTE — Consult Note (Addendum)
 [name] [birth year]  [id number].    Requesting MD: Dr. Rankin River Chief Complaint/Reason for Consult: Free air  HPI: [name] is a 63 y.o. female with hx of HTN, HLD and who presented to the ED for abdominal pain. Patient reports yesterday evening around 5pm, while she was lying down, she began having severe abdominal pain that felt like a tire wrapped around her in her mid abdomen with radiation to her SS/left chest and left shoulder. Pain is worse with breathing and palpation with associated sob. No fever, n/v, or urinary symptoms. She did have n/v 2 weeks ago that was followed by constipation but had a normal, non-bloody bm on the day of symptom onset. She is on chronic nsaids for knee pain. She reports occasional etoh use. She denies tobacco or illicit drug use. No hx of gastric ulcers. She has never had a colonoscopy. Prior open appendectomy and c-section. No other abdominal surgeries. She is not on blood thinners. She lives at home alone.   ROS: ROS As above, see hpi  Family History  Problem Relation Age of Onset   High blood pressure Mother    High Cholesterol Mother    Thyroid disease Mother    Cancer Mother    High blood pressure Father    High Cholesterol Father    Heart disease Father    Obesity Father     Past Medical History:  Diagnosis Date   Arthritis    Back pain    Chronic knee pain    Edema of both lower extremities    High blood pressure    High cholesterol    Joint pain    Multiple sclerosis (HCC)    Neuromuscular disorder (HCC)    Multiple Sclerosis over 20 years   Obesity    Vitamin D  deficiency     Past Surgical History:  Procedure Laterality Date   APPENDECTOMY     CESAREAN SECTION     HAND SURGERY     lumbar back surgery     LUMBAR WOUND DEBRIDEMENT N/A 12/05/2020   Procedure: Lumbar Wound Exploration;  Surgeon: Gillie Duncans, MD;  Location: [facility] OR;  Service: Neurosurgery;  Laterality: N/A;  3C/RM 21   LUMBAR WOUND  DEBRIDEMENT N/A 02/21/2021   Procedure: Lumbar wound exploration;  Surgeon: Gillie Duncans, MD;  Location: [facility] OR;  Service: Neurosurgery;  Laterality: N/A;  posterior   LUMBAR WOUND DEBRIDEMENT N/A 04/11/2021   Procedure: Wound Exploration with Wound Vac Placement;  Surgeon: Gillie Duncans, MD;  Location: [facility] OR;  Service: Neurosurgery;  Laterality: N/A;   ROTATOR CUFF REPAIR     TONSILLECTOMY      Social History:  reports that she has never smoked. She has never used smokeless tobacco. She reports current alcohol use. She reports that she does not use drugs.  Allergies: No Known Allergies  (Not in a hospital admission)    Physical Exam: Blood pressure (!) 143/107, pulse (!) 106, temperature 98.3 F (36.8 C), resp. rate 17, height 5' 3 (1.6 m), weight 112.9 kg, SpO2 98%. General: pleasant, WD/WN female who is laying in bed in NAD HEENT: head is normocephalic, atraumatic.  Sclera are non-icteric.  Heart: tachycardic Lungs: CTAB, no wheezes, rhonchi, or rales noted.  Respiratory effort nonlabored. On o2.  Abd: Soft, mild distension. Left sided ttp without rigidity or guarding. Epigastric ttp with guarding, +BS. No masses, hernias, or organomegaly MS: no BUE or BLE edema Skin: warm and dry  Psych:  A&Ox4 with an appropriate affect Neuro: normal speech, thought process intact, gait not assessed   Results for orders placed or performed during the hospital encounter of 08/02/24 (from the past 48 hours)  Basic metabolic panel     Status: Abnormal   Collection Time: 08/02/24  4:27 AM  Result Value Ref Range   Sodium 136 135 - 145 mmol/L   Potassium 3.7 3.5 - 5.1 mmol/L   Chloride 99 98 - 111 mmol/L   CO2 26 22 - 32 mmol/L   Glucose, Bld 139 (H) 70 - 99 mg/dL    Comment: Glucose reference range applies only to samples taken after fasting for at least 8 hours.   BUN 18 8 - 23 mg/dL   Creatinine, Ser 9.00 0.44 - 1.00 mg/dL   Calcium  9.2 8.9 - 10.3 mg/dL   GFR, Estimated >39 >39 mL/min     Comment: (NOTE) Calculated using the CKD-EPI Creatinine Equation (2021)    Anion gap 11 5 - 15    Comment: Performed at [facility], [address], [place], [place] [address]  CBC     Status: Abnormal   Collection Time: 08/02/24  4:27 AM  Result Value Ref Range   WBC 13.4 (H) 4.0 - 10.5 K/uL   RBC 3.90 3.87 - 5.11 MIL/uL   Hemoglobin 11.1 (L) 12.0 - 15.0 g/dL   HCT 64.6 (L) 63.9 - 53.9 %   MCV 90.5 80.0 - 100.0 fL   MCH 28.5 26.0 - 34.0 pg   MCHC 31.4 30.0 - 36.0 g/dL   RDW 86.6 88.4 - 84.4 %   Platelets 327 150 - 400 K/uL   nRBC 0.0 0.0 - 0.2 %    Comment: Performed at [facility], [address], [place], [place] [address]  Troponin I (High Sensitivity)     Status: None   Collection Time: 08/02/24  4:27 AM  Result Value Ref Range   Troponin I (High Sensitivity) 7 <18 ng/L    Comment: (NOTE) Elevated high sensitivity troponin I (hsTnI) values and significant  changes across serial measurements may suggest ACS but many other  chronic and acute conditions are known to elevate hsTnI results.  Refer to the Links section for chest pain algorithms and additional  guidance. Performed at [facility], [address], [place], [place] [address]   D-dimer, quantitative     Status: Abnormal   Collection Time: 08/02/24  5:32 AM  Result Value Ref Range   D-Dimer, Quant 4.42 (H) 0.00 - 0.50 ug/mL-FEU    Comment: (NOTE) At the manufacturer cut-off value of 0.5 g/mL FEU, this assay has a negative predictive value of 95-100%.This assay is intended for use in conjunction with a clinical pretest probability (PTP) assessment model to exclude pulmonary embolism (PE) and deep venous thrombosis (DVT) in outpatients suspected of PE or DVT. Results should be correlated with clinical presentation. Performed at [facility], [address], [place], [place] [address]   Hepatic function panel     Status: Abnormal   Collection Time: 08/02/24  5:32 AM  Result Value  Ref Range   Total Protein 6.9 6.5 - 8.1 g/dL   Albumin  3.2 (L) 3.5 - 5.0 g/dL   AST 18 15 - 41 U/L   ALT 15 0 - 44 U/L   Alkaline Phosphatase 82 38 - 126 U/L   Total Bilirubin 0.5 0.0 - 1.2 mg/dL   Bilirubin, Direct <9.8 0.0 - 0.2 mg/dL  Indirect Bilirubin NOT CALCULATED 0.3 - 0.9 mg/dL    Comment: Performed at [facility], [address], [place], [place] [address]  Lipase, blood     Status: None   Collection Time: 08/02/24  5:32 AM  Result Value Ref Range   Lipase 24 11 - 51 U/L    Comment: Performed at [facility], [address] [place], [place] [address]   DG Chest 2 View Addendum Date: 08/02/2024 ADDENDUM REPORT: 08/02/2024 07:06 ADDENDUM: Critical Value/emergent results were called by telephone at the time of interpretation on 08/02/2024 at 0559 to provider [facility] , who verbally acknowledged these results. Electronically Signed   By: Camellia Candle M.D.   On: 08/02/2024 07:06   Result Date: 08/02/2024 CLINICAL DATA:  Chest pain EXAM: CHEST - 2 VIEW COMPARISON:  12/17/2013 FINDINGS: Low lung volumes. There is pulmonary vascular congestion without overt pulmonary edema. The cardio pericardial silhouette is enlarged. Lucency identified under each hemidiaphragm, raising concern for intraperitoneal free air. Bones are diffusely demineralized. IMPRESSION: 1. Lucency under each hemidiaphragm, concerning for intraperitoneal free air. CT abdomen and pelvis with IV contrast recommended to further evaluate. 2. Low volume film with pulmonary vascular congestion. Electronically Signed: By: Camellia Candle M.D. On: 08/02/2024 05:40   CT Angio Chest/Abd/Pel for Dissection W and/or Wo Contrast Result Date: 08/02/2024 CLINICAL DATA:  Pain.  Acute aortic syndrome suspected. EXAM: CT ANGIOGRAPHY CHEST, ABDOMEN AND PELVIS TECHNIQUE: Non-contrast CT of the chest was initially obtained. Multidetector CT imaging through the chest, abdomen and pelvis was performed using the standard protocol  during bolus administration of intravenous contrast. Multiplanar reconstructed images and MIPs were obtained and reviewed to evaluate the vascular anatomy. RADIATION DOSE REDUCTION: This exam was performed according to the departmental dose-optimization program which includes automated exposure control, adjustment of the mA and/or kV according to patient size and/or use of iterative reconstruction technique. CONTRAST:  OMNIPAQUE  IOHEXOL  350 MG/ML SOLN COMPARISON:  None Available. FINDINGS: CTA CHEST FINDINGS Cardiovascular: Pre contrast imaging shows no hyperdense crescent in the wall of the thoracic aorta to suggest the presence of an acute intramural hematoma. Imaging after IV contrast administration shows no dissection of the thoracic aorta. No thoracic aortic aneurysm. No large central pulmonary embolus. Mediastinum/Nodes: No mediastinal lymphadenopathy. There is no hilar lymphadenopathy. Moderate hiatal hernia. The esophagus has normal imaging features. There is no axillary lymphadenopathy. Lungs/Pleura: No suspicious pulmonary nodule or mass. No focal airspace consolidation. There is no evidence of pleural effusion. Down No worrisome lytic or sclerotic osseous abnormality. Musculoskeletal: No worrisome lytic or sclerotic osseous abnormality. Review of the MIP images confirms the above findings. CTA ABDOMEN AND PELVIS FINDINGS VASCULAR Aorta: Normal caliber aorta without aneurysm, dissection, vasculitis or significant stenosis. There is moderate atherosclerotic calcification of the abdominal aorta without aneurysm. Celiac: Patent without evidence of aneurysm, dissection, vasculitis or significant stenosis. SMA: Patent without evidence of aneurysm, dissection, vasculitis or significant stenosis. Renals: Both renal arteries are patent without evidence of aneurysm, dissection, vasculitis, fibromuscular dysplasia or significant stenosis. Accessory right renal artery. IMA: Proximal stenosis not excluded.  Inflow: Patent without evidence of aneurysm, dissection, vasculitis or significant stenosis. Veins: No obvious venous abnormality within the limitations of this arterial phase study. Review of the MIP images confirms the above findings. NON-VASCULAR Hepatobiliary: No suspicious focal abnormality within the liver parenchyma. Gallbladder is distended without substantial pericholecystic edema or fluid. No intrahepatic or extrahepatic biliary dilation. Pancreas: No focal mass lesion. No dilatation of the main duct.  No intraparenchymal cyst. No peripancreatic edema. Spleen: No splenomegaly. No suspicious focal mass lesion. Adrenals/Urinary Tract: No adrenal nodule or mass. Kidneys unremarkable. No evidence for hydroureter. The urinary bladder appears normal for the degree of distention. Stomach/Bowel: Gastric wall appears thickened ill-defined with perigastric edema evident. Duodenum is normally positioned as is the ligament of Treitz. No small bowel wall thickening. No small bowel dilatation. Is the terminal ileum nor the appendix are well visualized. No gross colonic mass. No colonic wall thickening. Lymphatic: There is no gastrohepatic or hepatoduodenal ligament lymphadenopathy. No retroperitoneal or mesenteric lymphadenopathy. No pelvic sidewall lymphadenopathy. Reproductive: There is no adnexal mass. Other: No intraperitoneal free fluid. Intraperitoneal free air is identified, predominantly in the abdomen. Musculoskeletal: No worrisome lytic or sclerotic osseous abnormality. Posterior fusion hardware noted L4-5. The pedicle screws bilaterally at L4 exit the superior cortex of each pedicle extending to the posterior L3-4 disc space. Review of the MIP images confirms the above findings. IMPRESSION: 1. No evidence for acute intramural hematoma or dissection of the thoracoabdominal aorta. No thoracoabdominal aortic aneurysm. 2. Intraperitoneal free air, predominantly in the abdomen. Gastric wall appears thickened and  ill-defined with perigastric edema evident. Although the precise source of the intraperitoneal free air is not evident on the study, gastric origin is favored. 3. Moderate hiatal hernia. Findings of free air were communicated to Dr. Carita after interpretation of the patient's chest x-ray earlier today at 0559 hours. Electronically Signed   By: Camellia Candle M.D.   On: 08/02/2024 07:06    Anti-infectives (From admission, onward)    Start     Dose/Rate Route Frequency Ordered Stop   08/02/24 0615  piperacillin -tazobactam (ZOSYN ) IVPB 3.375 g        3.375 g 100 mL/hr over 30 Minutes Intravenous  Once 08/02/24 0604 08/02/24 [id number]       Assessment/Plan Jarah C Secrist is a 63 y.o. female on chronic nsaids who presented with severe abdominal pain since yesterday and was found to have free air on CT w/ Gastric wall thickening and ill-defined with perigastric edema. She is currently afebrile and tachycardic in the low 100's. BP initially soft but improved after IVF. WBC 13.4. She has epigastric ttp with guarding. Recommend OR for ex lap. The planned procedure and material risks were discussed with the patient. We also discussed typical post-operative care including the possible need for rehab/snf if necessary. The patient's questions were answered to their satisfaction, they voiced understanding and elected to proceed with surgery. Cont abx and resuscitation. Plan admission to inpatient post op.   FEN - NPO, PPI, IVF VTE - SCDs ID - Zosyn   I reviewed nursing notes, last 24 h vitals and pain scores, last 48 h intake and output, last 24 h labs and trends, and last 24 h imaging results.  Ozell CHRISTELLA Shaper, [facility] 08/02/2024, 7:40 AM Please see Amion for pager number during day hours 7:00am-4:30pm

## 2024-08-02 NOTE — ED Notes (Signed)
 Pt placed on 3L O2 via [place], O2 sat now 95%

## 2024-08-02 NOTE — Anesthesia Preprocedure Evaluation (Addendum)
 Anesthesia Evaluation  Patient identified by MRN, date of birth, ID band Patient awake    Reviewed: Allergy & Precautions, NPO status , Patient's Chart, lab work & pertinent test results, reviewed documented beta blocker date and time   Airway Mallampati: II  TM Distance: >3 FB Neck ROM: Full    Dental no notable dental hx. (+) Teeth Intact, Dental Advisory Given, Missing,    Pulmonary neg pulmonary ROS   breath sounds clear to auscultation + decreased breath sounds      Cardiovascular hypertension, Pt. on medications Normal cardiovascular exam Rhythm:Regular Rate:Tachycardia  EKG 08/02/24 Sinus tachycardia Left axis deviation Incomplete right bundle branch block Inferior infarct , age undetermined Possible Anterolateral infarct , age undetermined   Neuro/Psych MS  Neuromuscular disease  negative psych ROS   GI/Hepatic negative GI ROS, Neg liver ROS,,,Free intra abdominal air   Endo/Other    Class 3 obesityHyperlipidemia  Renal/GU negative Renal ROS  negative genitourinary   Musculoskeletal  (+) Arthritis , Osteoarthritis,  Chronic LBP Lumbar wound drainage s/p multiple lumbar surgeries    Abdominal  (+) + obese Abdomen: tender.   Peds  Hematology negative hematology ROS (+)   Anesthesia Other Findings   Reproductive/Obstetrics negative OB ROS                              Anesthesia Physical Anesthesia Plan  ASA: 3 and emergent  Anesthesia Plan: General   Post-op Pain Management: Minimal or no pain anticipated, Dilaudid  IV, Ofirmev  IV (intra-op)* and Precedex    Induction: Intravenous, Cricoid pressure planned and Rapid sequence  PONV Risk Score and Plan: 3 and Ondansetron , Dexamethasone , Midazolam  and Treatment may vary due to age or medical condition  Airway Management Planned: Oral ETT  Additional Equipment: None  Intra-op Plan:   Post-operative Plan: Extubation in  OR  Informed Consent: I have reviewed the patients History and Physical, chart, labs and discussed the procedure including the risks, benefits and alternatives for the proposed anesthesia with the patient or authorized representative who has indicated his/her understanding and acceptance.     Dental advisory given  Plan Discussed with: CRNA and Anesthesiologist  Anesthesia Plan Comments:          Anesthesia Quick Evaluation

## 2024-08-02 NOTE — Hospital Course (Addendum)
--  CONSULT -MS  -Perforated gastric ulcer  -  MS review 04/06/2024  Diagnosis made: Early 2000's optic neuritis OS  Last relapse 04/2015  Last MRI scan: 01/2021 - no change from 2020  Current DMT: Aubagio   Prior DMTs Rebif  JC virus status N/a  Vitamin D  level 02/2022 - 46.0   Aubagio   Losartan  Hydrochlorothiazide   Modifinal 300 Diclofenac  Rosuvastatin   Gabapentin  600 BID  Vitmain D  She's in a lot of pain, not talking  Dont need to take BP meds or MS meds or HLD meds Her Hgb is going back up, patient asked you about it There is blood in her drain Heart sounds good Lungs sounds normal <3 you have the best bedside manner :)

## 2024-08-02 NOTE — ED Provider Notes (Signed)
 [place] [facility] AT [facility] Provider Note   CSN: [id number] Arrival date & time: 08/02/24  9640     Patient presents with: Chest Pain   [name] is a 63 y.o. female.   Patient with a history of chronic back pain, multiple sclerosis, hypertension, high cholesterol presents with left-sided chest pain.  Symptoms started about 5 PM while she was resting in bed.  Describes the pain as pressure and feels like a tire wrapped around me.  Pain radiates to left shoulder, neck and upper back area.  Since she came to the hospital she is not having pain to her left stomach but is spreading diffusely across her abdomen as well.  She says the entire left side of her body hurts and her abdominal pain is spreading to the right side now.  Having some shortness of breath and pain with breathing but no cough or fever.  Denies any cardiac history.  Pain is worse with movement and worse with deep breathing.  Was hypoxic in triage but this has resolved by the time she is gotten into her room.  States she has had nasal drainage for several weeks and some sore throat and nausea. She has never had the kind of chest pain before.  The history is provided by the patient.  Chest Pain Associated symptoms: abdominal pain, nausea and shortness of breath   Associated symptoms: no dizziness, no fever, no headache, no vomiting and no weakness        Prior to Admission medications   Medication Sig Start Date End Date Taking? Authorizing Provider  Cholecalciferol  (VITAMIN D -3) 125 MCG (5000 UT) TABS Take 15,000 Units by mouth daily.    [provider]  gabapentin  (NEURONTIN ) 300 MG capsule Take 300 mg by mouth 3 (three) times daily.    [provider]  hydrochlorothiazide  (HYDRODIURIL ) 25 MG tablet Take 25 mg by mouth daily.    [provider]  HYDROcodone -acetaminophen  (NORCO) 7.5-325 MG tablet Take 1 tablet by mouth every 4 (four) hours as needed for moderate  pain or severe pain. 04/13/21   Colon Shove, MD  losartan  (COZAAR ) 100 MG tablet Take 100 mg by mouth daily. 01/20/20   [provider]  modafinil  (PROVIGIL ) 200 MG tablet Take 200 mg by mouth See admin instructions. Morning and Midday by 1400-1500    [provider]  Multiple Vitamins-Minerals (MULTIVITAMIN WITH MINERALS) tablet Take 1 tablet by mouth daily. 55 +    [provider]  rosuvastatin  (CRESTOR ) 10 MG tablet Take 10 mg by mouth at bedtime.     [provider]  Teriflunomide  14 MG TABS Take 14 mg by mouth daily. Aubagio     [provider]  tiZANidine  (ZANAFLEX ) 4 MG tablet Take 1 tablet (4 mg total) by mouth every 8 (eight) hours as needed for muscle spasms. Patient not taking: Reported on 04/05/2021 12/06/20   Gillie Duncans, MD    Allergies: Patient has no known allergies.    Review of Systems  Constitutional:  Negative for activity change, appetite change and fever.  HENT:  Negative for congestion.   Respiratory:  Positive for chest tightness and shortness of breath.   Cardiovascular:  Positive for chest pain.  Gastrointestinal:  Positive for abdominal pain and nausea. Negative for vomiting.  Genitourinary:  Negative for dysuria and hematuria.  Musculoskeletal:  Negative for arthralgias and myalgias.  Skin:  Negative for rash.  Neurological:  Negative for dizziness, weakness and headaches.  all other systems are negative except as noted in the HPI and PMH.    Updated Vital Signs BP (!) 140/103 (BP Location: Right Wrist)   Pulse (!) 105   Temp 98.3 F (36.8 C)   Resp (!) 22   Ht 5' 3 (1.6 m)   Wt 112.9 kg   SpO2 100%   BMI 44.11 kg/m   Physical Exam Vitals and nursing note reviewed.  Constitutional:      General: She is not in acute distress.    Appearance: She is well-developed. She is not ill-appearing.  HENT:     Head: Normocephalic and atraumatic.     Mouth/Throat:     Pharynx: No oropharyngeal exudate.  Eyes:      Conjunctiva/sclera: Conjunctivae normal.     Pupils: Pupils are equal, round, and reactive to light.  Neck:     Comments: No meningismus. Cardiovascular:     Rate and Rhythm: Normal rate and regular rhythm.     Heart sounds: Normal heart sounds. No murmur heard. Pulmonary:     Effort: Pulmonary effort is normal. No respiratory distress.     Breath sounds: Normal breath sounds.     Comments: Left chest wall tenderness worse with palpation Chest:     Chest wall: Tenderness present.  Abdominal:     Palpations: Abdomen is soft.     Tenderness: There is abdominal tenderness. There is no guarding or rebound.     Comments: Diffuse tenderness, no guarding or rebound  Musculoskeletal:        General: No tenderness. Normal range of motion.     Cervical back: Normal range of motion and neck supple.     Comments: Intact radial, DP and PT pulses bilaterally  Skin:    General: Skin is warm.  Neurological:     Mental Status: She is alert and oriented to person, place, and time.     Cranial Nerves: No cranial nerve deficit.     Motor: No abnormal muscle tone.     Coordination: Coordination normal.     Comments:  5/5 strength throughout. CN 2-12 intact.Equal grip strength.   Psychiatric:        Behavior: Behavior normal.     (all labs ordered are listed, but only abnormal results are displayed) Labs Reviewed  BASIC METABOLIC PANEL WITH GFR - Abnormal; Notable for the following components:      Result Value   Glucose, Bld 139 (*)    All other components within normal limits  CBC - Abnormal; Notable for the following components:   WBC 13.4 (*)    Hemoglobin 11.1 (*)    HCT 35.3 (*)    All other components within normal limits  D-DIMER, QUANTITATIVE - Abnormal; Notable for the following components:   D-Dimer, Quant 4.42 (*)    All other components within normal limits  HEPATIC FUNCTION PANEL - Abnormal; Notable for the following components:   Albumin  3.2 (*)    All other components  within normal limits  LIPASE, BLOOD  TROPONIN I (HIGH SENSITIVITY)  TROPONIN I (HIGH SENSITIVITY)    EKG: EKG Interpretation Date/Time:  Monday August 02 2024 04:17:32 EDT Ventricular Rate:  109 PR Interval:  178 QRS Duration:  94 QT Interval:  324 QTC Calculation: 436 R Axis:   -86  Text Interpretation: Sinus tachycardia Left axis deviation Incomplete right bundle branch block Inferior infarct , age undetermined Possible Anterolateral infarct , age undetermined Abnormal ECG When compared with ECG of 21-Feb-2021 09:33,  PREVIOUS ECG IS PRESENT No significant change was found Confirmed by Carita Senior [phone] on 08/02/2024 4:29:03 AM  Radiology: [facility] Chest 2 View Addendum Date: 08/02/2024 ADDENDUM REPORT: 08/02/2024 07:06 ADDENDUM: Critical Value/emergent results were called by telephone at the time of interpretation on 08/02/2024 at 0559 to provider [facility] , who verbally acknowledged these results. Electronically Signed   By: Camellia Candle M.D.   On: 08/02/2024 07:06   Result Date: 08/02/2024 CLINICAL DATA:  Chest pain EXAM: CHEST - 2 VIEW COMPARISON:  12/17/2013 FINDINGS: Low lung volumes. There is pulmonary vascular congestion without overt pulmonary edema. The cardio pericardial silhouette is enlarged. Lucency identified under each hemidiaphragm, raising concern for intraperitoneal free air. Bones are diffusely demineralized. IMPRESSION: 1. Lucency under each hemidiaphragm, concerning for intraperitoneal free air. CT abdomen and pelvis with IV contrast recommended to further evaluate. 2. Low volume film with pulmonary vascular congestion. Electronically Signed: By: Camellia Candle M.D. On: 08/02/2024 05:40   CT Angio Chest/Abd/Pel for Dissection W and/or Wo Contrast Result Date: 08/02/2024 CLINICAL DATA:  Pain.  Acute aortic syndrome suspected. EXAM: CT ANGIOGRAPHY CHEST, ABDOMEN AND PELVIS TECHNIQUE: Non-contrast CT of the chest was initially obtained. Multidetector CT imaging through  the chest, abdomen and pelvis was performed using the standard protocol during bolus administration of intravenous contrast. Multiplanar reconstructed images and MIPs were obtained and reviewed to evaluate the vascular anatomy. RADIATION DOSE REDUCTION: This exam was performed according to the departmental dose-optimization program which includes automated exposure control, adjustment of the mA and/or kV according to patient size and/or use of iterative reconstruction technique. CONTRAST:  OMNIPAQUE  IOHEXOL  350 MG/ML SOLN COMPARISON:  None Available. FINDINGS: CTA CHEST FINDINGS Cardiovascular: Pre contrast imaging shows no hyperdense crescent in the wall of the thoracic aorta to suggest the presence of an acute intramural hematoma. Imaging after IV contrast administration shows no dissection of the thoracic aorta. No thoracic aortic aneurysm. No large central pulmonary embolus. Mediastinum/Nodes: No mediastinal lymphadenopathy. There is no hilar lymphadenopathy. Moderate hiatal hernia. The esophagus has normal imaging features. There is no axillary lymphadenopathy. Lungs/Pleura: No suspicious pulmonary nodule or mass. No focal airspace consolidation. There is no evidence of pleural effusion. Down No worrisome lytic or sclerotic osseous abnormality. Musculoskeletal: No worrisome lytic or sclerotic osseous abnormality. Review of the MIP images confirms the above findings. CTA ABDOMEN AND PELVIS FINDINGS VASCULAR Aorta: Normal caliber aorta without aneurysm, dissection, vasculitis or significant stenosis. There is moderate atherosclerotic calcification of the abdominal aorta without aneurysm. Celiac: Patent without evidence of aneurysm, dissection, vasculitis or significant stenosis. SMA: Patent without evidence of aneurysm, dissection, vasculitis or significant stenosis. Renals: Both renal arteries are patent without evidence of aneurysm, dissection, vasculitis, fibromuscular dysplasia or significant stenosis.  Accessory right renal artery. IMA: Proximal stenosis not excluded. Inflow: Patent without evidence of aneurysm, dissection, vasculitis or significant stenosis. Veins: No obvious venous abnormality within the limitations of this arterial phase study. Review of the MIP images confirms the above findings. NON-VASCULAR Hepatobiliary: No suspicious focal abnormality within the liver parenchyma. Gallbladder is distended without substantial pericholecystic edema or fluid. No intrahepatic or extrahepatic biliary dilation. Pancreas: No focal mass lesion. No dilatation of the main duct. No intraparenchymal cyst. No peripancreatic edema. Spleen: No splenomegaly. No suspicious focal mass lesion. Adrenals/Urinary Tract: No adrenal nodule or mass. Kidneys unremarkable. No evidence for hydroureter. The urinary bladder appears normal for the degree of distention. Stomach/Bowel: Gastric wall appears thickened ill-defined with perigastric edema evident. Duodenum is normally positioned as is the  ligament of Treitz. No small bowel wall thickening. No small bowel dilatation. Is the terminal ileum nor the appendix are well visualized. No gross colonic mass. No colonic wall thickening. Lymphatic: There is no gastrohepatic or hepatoduodenal ligament lymphadenopathy. No retroperitoneal or mesenteric lymphadenopathy. No pelvic sidewall lymphadenopathy. Reproductive: There is no adnexal mass. Other: No intraperitoneal free fluid. Intraperitoneal free air is identified, predominantly in the abdomen. Musculoskeletal: No worrisome lytic or sclerotic osseous abnormality. Posterior fusion hardware noted L4-5. The pedicle screws bilaterally at L4 exit the superior cortex of each pedicle extending to the posterior L3-4 disc space. Review of the MIP images confirms the above findings. IMPRESSION: 1. No evidence for acute intramural hematoma or dissection of the thoracoabdominal aorta. No thoracoabdominal aortic aneurysm. 2. Intraperitoneal free air,  predominantly in the abdomen. Gastric wall appears thickened and ill-defined with perigastric edema evident. Although the precise source of the intraperitoneal free air is not evident on the study, gastric origin is favored. 3. Moderate hiatal hernia. Findings of free air were communicated to Dr. Carita after interpretation of the patient's chest x-ray earlier today at 0559 hours. Electronically Signed   By: Camellia Candle M.D.   On: 08/02/2024 07:06     .Critical Care  Performed by: Carita Senior, MD Authorized by: Carita Senior, MD   Critical care provider statement:    Critical care time (minutes):  35   Critical care time was exclusive of:  Separately billable procedures and treating other patients   Critical care was necessary to treat or prevent imminent or life-threatening deterioration of the following conditions:  Sepsis   Critical care was time spent personally by me on the following activities:  Development of treatment plan with patient or surrogate, discussions with consultants, evaluation of patient's response to treatment, examination of patient, ordering and review of laboratory studies, ordering and review of radiographic studies, ordering and performing treatments and interventions, pulse oximetry, re-evaluation of patient's condition, review of old charts, blood draw for specimens and obtaining history from patient or surrogate   I assumed direction of critical care for this patient from another provider in my specialty: no     Care discussed with: admitting provider      Medications Ordered in the ED  aspirin  chewable tablet 324 mg (has no administration in time range)  morphine  (PF) 4 MG/ML injection 4 mg (has no administration in time range)                                    Medical Decision Making Amount and/or Complexity of Data Reviewed Labs: ordered. Decision-making details documented in ED Course. Radiology: ordered and independent interpretation  performed. Decision-making details documented in ED Course. ECG/medicine tests: ordered and independent interpretation performed. Decision-making details documented in ED Course.  Risk OTC drugs. Prescription drug management. Decision regarding hospitalization.   Left-sided chest pain since 5 PM now spreading to her abdomen and back.  Pain worse with movement and deep breathing.  EKG shows sinus rhythm without acute ST changes.  Patient's left-sided chest pain is fairly reproducible worse with arm movement and breathing.  Does have diffuse abdominal pain as well but no peritonitis.  Triage x-ray is concerning for air under the diaphragm bilaterally.  Discussed with Dr. Candle of radiology who agrees.  Discussed with Dr. Sebastian of general surgery.  Agrees with obtaining CT scan and surgery will evaluate.  IV Zosyn  given.  IV fluids  given.  Labs show leukocytosis of 13.  Troponin negative. Chest pain is atypical for ACS.  D-dimer is positive but suspect this is likely secondary to her intra-abdominal free air.  Will send for CT scan to confirm source of free air. She remains hemodynamically stable. She did receive pain medicine and antibiotics. No large central PE identified.  CT scan shows free air without identifiable source possibly gastric.  Awaiting surgical evaluation at shift change.         Final diagnoses:  Perforated abdominal viscus  Atypical chest pain    ED Discharge Orders     None          Malik Ruffino, Garnette, MD 08/02/24 [phone]

## 2024-08-03 ENCOUNTER — Encounter (HOSPITAL_COMMUNITY): Payer: Self-pay | Admitting: Surgery

## 2024-08-03 DIAGNOSIS — K251 Acute gastric ulcer with perforation: Secondary | ICD-10-CM

## 2024-08-03 LAB — CBC
HCT: 24.6 % — ABNORMAL LOW (ref 36.0–46.0)
HCT: 25.4 % — ABNORMAL LOW (ref 36.0–46.0)
Hemoglobin: 7.9 g/dL — ABNORMAL LOW (ref 12.0–15.0)
Hemoglobin: 8.2 g/dL — ABNORMAL LOW (ref 12.0–15.0)
MCH: 28.6 pg (ref 26.0–34.0)
MCH: 29.1 pg (ref 26.0–34.0)
MCHC: 32.1 g/dL (ref 30.0–36.0)
MCHC: 32.3 g/dL (ref 30.0–36.0)
MCV: 89.1 fL (ref 80.0–100.0)
MCV: 90.1 fL (ref 80.0–100.0)
Platelets: 238 K/uL (ref 150–400)
Platelets: 230 K/uL (ref 150–400)
RBC: 2.76 MIL/uL — ABNORMAL LOW (ref 3.87–5.11)
RBC: 2.82 MIL/uL — ABNORMAL LOW (ref 3.87–5.11)
RDW: 13.5 % (ref 11.5–15.5)
RDW: 13.4 % (ref 11.5–15.5)
WBC: 20.7 K/uL — ABNORMAL HIGH (ref 4.0–10.5)
WBC: 22.5 K/uL — ABNORMAL HIGH (ref 4.0–10.5)
nRBC: 0 % (ref 0.0–0.2)
nRBC: 0 % (ref 0.0–0.2)

## 2024-08-03 LAB — FOLATE: Folate: 6.3 ng/mL (ref >5.9)

## 2024-08-03 LAB — IRON AND TIBC
Iron: <5 ug/dL — ABNORMAL LOW (ref 28–170)
TIBC: 203 ug/dL — ABNORMAL LOW (ref 250–450)

## 2024-08-03 LAB — BASIC METABOLIC PANEL WITH GFR
Anion gap: 8 (ref 5–15)
BUN: 15 mg/dL (ref 8–23)
CO2: 26 mmol/L (ref 22–32)
Calcium: 8.5 mg/dL — ABNORMAL LOW (ref 8.9–10.3)
Chloride: 103 mmol/L (ref 98–111)
Creatinine, Ser: 0.79 mg/dL (ref 0.44–1.00)
GFR, Estimated: >60 mL/min (ref >60)
Glucose, Bld: 109 mg/dL — ABNORMAL HIGH (ref 70–99)
Potassium: 3.5 mmol/L (ref 3.5–5.1)
Sodium: 137 mmol/L (ref 135–145)

## 2024-08-03 LAB — HIV ANTIBODY (ROUTINE TESTING W REFLEX): HIV Screen 4th Generation wRfx: NONREACTIVE

## 2024-08-03 LAB — FERRITIN: Ferritin: 143 ng/mL (ref 11–307)

## 2024-08-03 LAB — VITAMIN B12: Vitamin B-12: 632 pg/mL (ref 180–914)

## 2024-08-03 LAB — SURGICAL PATHOLOGY

## 2024-08-03 MED ORDER — ACETAMINOPHEN 10 MG/ML IV SOLN
1000.0000 mg | Freq: Three times a day (TID) | INTRAVENOUS | Status: AC
  Administered 2024-08-03 (×2): 1000 mg via INTRAVENOUS
  Filled 2024-08-03 (×3): qty 100

## 2024-08-03 MED ORDER — ENOXAPARIN SODIUM 60 MG/0.6ML IJ SOSY: 60.0000 mg | PREFILLED_SYRINGE | INTRAMUSCULAR | Status: DC

## 2024-08-03 MED ORDER — ENOXAPARIN SODIUM 60 MG/0.6ML IJ SOSY
60.0000 mg | PREFILLED_SYRINGE | INTRAMUSCULAR | Status: DC
  Administered 2024-08-04 – 2024-08-16 (×16): 60 mg via SUBCUTANEOUS
  Filled 2024-08-03 (×13): qty 0.6

## 2024-08-03 NOTE — Evaluation (Signed)
 Physical Therapy Evaluation Patient Details Name: [name] MRN: [id number] DOB: February 22, 1961 Today's Date: 08/03/2024  History of Present Illness  63 y.o. female presents to [facility] hospital on 08/02/2024 with abdominal pain. Imaging with free air in abdomen. Pt underwent ex-lap with repair of perforated gastric ulcer on 8/4. PMH includes HTN, HLD, MS, OA.  Clinical Impression  Pt presents to PT with deficits in functional mobility, gait, balance, endurance, strength, power. Pt has significant chronic knee OA which appears to greatly affect mobility quality, along with pain and weakness from ex-lap yesterday. The patient requires significant assistance to stand and is at a high risk for falls due to weakness and impaired endurance. Patient will benefit from intensive inpatient follow-up therapy, >3 hours/day.        If plan is discharge home, recommend the following: A lot of help with walking and/or transfers;A lot of help with bathing/dressing/bathroom;Assistance with cooking/housework;Assist for transportation;Help with stairs or ramp for entrance   Can travel by private vehicle        Equipment Recommendations Wheelchair (measurements PT);Wheelchair cushion (measurements PT);Hospital bed (DME needs subject to change pending progress)  Recommendations for Other Services  Rehab consult    Functional Status Assessment Patient has had a recent decline in their functional status and demonstrates the ability to make significant improvements in function in a reasonable and predictable amount of time.     Precautions / Restrictions Precautions Precautions: Fall Recall of Precautions/Restrictions: Intact Precaution/Restrictions Comments: NG tube to suction, JP drain Restrictions Weight Bearing Restrictions Per Provider Order: No      Mobility  Bed Mobility Overal bed mobility: Needs Assistance Bed Mobility: Rolling, Sidelying to Sit, Sit to Sidelying Rolling: Mod assist, Used  rails Sidelying to sit: Mod assist, Used rails     Sit to sidelying: Mod assist, Used rails      Transfers Overall transfer level: Needs assistance Equipment used: 2 person hand held assist Transfers: Sit to/from Stand Sit to Stand: Max assist, +2 physical assistance                Ambulation/Gait             Pre-gait activities: pt takes 2 side steps to left side with support of RW, minA x 2    Stairs            Wheelchair Mobility     Tilt Bed    Modified Rankin (Stroke Patients Only)       Balance Overall balance assessment: Needs assistance Sitting-balance support: Single extremity supported, Feet supported Sitting balance-Leahy Scale: Poor     Standing balance support: Bilateral upper extremity supported, Reliant on assistive device for balance Standing balance-Leahy Scale: Poor Standing balance comment: minA once gripping RW with BUE                             Pertinent Vitals/Pain Pain Assessment Pain Assessment: 0-10 Pain Score: 6  Pain Location: abdomen Pain Descriptors / Indicators: Sore Pain Intervention(s): Limited activity within patient's tolerance    Home Living Family/patient expects to be discharged to:: Private residence Living Arrangements: Alone Available Help at Discharge: Family;Available PRN/intermittently (family visit daily to assist with IADLs) Type of Home: House Home Access: Stairs to enter;Ramped entrance Entrance Stairs-Rails: Right Entrance Stairs-Number of Steps: 3   Home Layout: One level Home Equipment: Agricultural consultant (2 wheels);Rollator (4 wheels);Tub bench;Grab bars - toilet;Grab bars - tub/shower  Prior Function Prior Level of Function : Needs assist             Mobility Comments: ambulates with use of RW, negotiates stairs backward with RW ADLs Comments: assistance for IADLs, does some cooking seated in rollator, limited by knee OA     Extremity/Trunk Assessment   Upper  Extremity Assessment Upper Extremity Assessment: Overall WFL for tasks assessed    Lower Extremity Assessment Lower Extremity Assessment: Generalized weakness (chronic knee OA bilaterally)    Cervical / Trunk Assessment Cervical / Trunk Assessment: Other exceptions (body habitus, open abdomen)  Communication   Communication Communication: No apparent difficulties    Cognition Arousal: Alert Behavior During Therapy: WFL for tasks assessed/performed   PT - Cognitive impairments: No apparent impairments                         Following commands: Intact       Cueing Cueing Techniques: Verbal cues     General Comments General comments (skin integrity, edema, etc.): pt in NAD, on 2L [place]    Exercises Other Exercises Other Exercises: PT encourages performance of heel slides due to pt's significant chronic bilateral knee OA   Assessment/Plan    PT Assessment Patient needs continued PT services  PT Problem List Decreased strength;Decreased activity tolerance;Decreased balance;Decreased mobility;Pain       PT Treatment Interventions DME instruction;Gait training;Stair training;Functional mobility training;Therapeutic activities;Neuromuscular re-education;Therapeutic exercise;Balance training;Patient/family education;Wheelchair mobility training    PT Goals (Current goals can be found in the Care Plan section)  Acute Rehab PT Goals Patient Stated Goal: to return to prior level of function PT Goal Formulation: With patient Time For Goal Achievement: 08/17/24 Potential to Achieve Goals: Fair    Frequency Min 3X/week     Co-evaluation               AM-PAC PT 6 Clicks Mobility  Outcome Measure Help needed turning from your back to your side while in a flat bed without using bedrails?: A Lot Help needed moving from lying on your back to sitting on the side of a flat bed without using bedrails?: A Lot Help needed moving to and from a bed to a chair  (including a wheelchair)?: A Lot Help needed standing up from a chair using your arms (e.g., wheelchair or bedside chair)?: A Lot Help needed to walk in hospital room?: Total Help needed climbing 3-5 steps with a railing? : Total 6 Click Score: 10    End of Session   Activity Tolerance: Patient tolerated treatment well Patient left: in bed;with call bell/phone within reach;with bed alarm set;with family/visitor present Nurse Communication: Mobility status;Need for lift equipment PT Visit Diagnosis: Other abnormalities of gait and mobility (R26.89);Muscle weakness (generalized) (M62.81);Pain Pain - part of body:  (abdomen)    Time: 1001-1036 PT Time Calculation (min) (ACUTE ONLY): 35 min   Charges:   PT Evaluation $PT Eval Low Complexity: 1 Low   PT General Charges $$ ACUTE PT VISIT: 1 Visit         Bernardino JINNY Ruth, PT, DPT Acute Rehabilitation Office [phone]   Bernardino JINNY Ruth 08/03/2024, 10:56 AM

## 2024-08-03 NOTE — Progress Notes (Signed)
 Foley catheter removed per MD order. Pt tolerated well. Pt due to void.

## 2024-08-03 NOTE — Plan of Care (Signed)
  Problem: Activity: Goal: Risk for activity intolerance will decrease Outcome: Progressing   Problem: Coping: Goal: Level of anxiety will decrease Outcome: Progressing   Problem: Pain Managment: Goal: General experience of comfort will improve and/or be controlled Outcome: Progressing   Problem: Safety: Goal: Ability to remain free from injury will improve Outcome: Progressing

## 2024-08-03 NOTE — Plan of Care (Signed)
  Problem: Pain Managment: Goal: General experience of comfort will improve and/or be controlled Outcome: Progressing   Problem: Safety: Goal: Ability to remain free from injury will improve Outcome: Progressing   Problem: Skin Integrity: Goal: Risk for impaired skin integrity will decrease Outcome: Progressing

## 2024-08-03 NOTE — Progress Notes (Signed)
 Lab called pt's hemoglobin went from 11.1 to 7.9. Notified Dr. Maryam Azadegan. MD requested re check. Ask MD to place new order for collection.

## 2024-08-03 NOTE — Progress Notes (Addendum)
 1 Day Post-Op  Subjective: CC: Patient reports R sided abdominal pain. Notes morphine  makes her nauseated but dilaudid  seems to help. No current nausea. NGT in place on LIWS. No flatus or BM. Has not been oob. Lives alone and walks with a walker at baseline.   Tmax 100.1. Last temp 98.4. Tachycardia resolved. Hypotension resolved w/ last BP 126/81. On 2L - reports she does not smoke and is not on o2 at baseline. No cp or sob.   WBC 20.7. Hgb 7.9 (11.1). +2.3L since admission. Cr wnl.   Objective: Vital signs in last 24 hours: Temp:  [98.4 F (36.9 C)-100.1 F (37.8 C)] 98.4 F (36.9 C) (08/05 0429) Pulse Rate:  [88-106] 97 (08/05 0840) Resp:  [12-20] 17 (08/05 0840) BP: (87-126)/(41-89) 126/81 (08/05 0840) SpO2:  [92 %-100 %] 98 % (08/05 0840)    Intake/Output from previous day: 08/04 0701 - 08/05 0700 In: 3463.6 [I.V.:2262.1; IV Piggyback:1201.5] Out: 1130 [Urine:910; Emesis/NG output:50; Drains:170] Intake/Output this shift: No intake/output data recorded.  PE: Gen:  Alert, NAD, pleasant Card:  Reg Pulm:  CTAB, no W/R/R, effort normal. On o2 Abd: Soft, mild distension, upper abdominal ttp without rigidity or guarding, some BS. Midline wound clean. JP drain with bloody SS fluid with some clot/sediment at the base of bulb 170cc/24 hours.  Ext:  No LE edema  Psych: A&Ox3   Lab Results:  Recent Labs    08/02/24 0427 08/03/24 0653  WBC 13.4* 20.7*  HGB 11.1* 7.9*  HCT 35.3* 24.6*  PLT 327 238   BMET Recent Labs    08/02/24 0427 08/03/24 0653  NA 136 137  K 3.7 3.5  CL 99 103  CO2 26 26  GLUCOSE 139* 109*  BUN 18 15  CREATININE 0.99 0.79  CALCIUM  9.2 8.5*   PT/INR No results for input(s): LABPROT, INR in the last 72 hours. CMP     Component Value Date/Time   NA 137 08/03/2024 0653   NA 140 01/17/2020 1326   K 3.5 08/03/2024 0653   CL 103 08/03/2024 0653   CO2 26 08/03/2024 0653   GLUCOSE 109 (H) 08/03/2024 0653   BUN 15 08/03/2024 0653    BUN 19 01/17/2020 1326   CREATININE 0.79 08/03/2024 0653   CALCIUM  8.5 (L) 08/03/2024 0653   PROT 6.9 08/02/2024 0532   PROT 6.3 01/17/2020 1326   ALBUMIN  3.2 (L) 08/02/2024 0532   ALBUMIN  4.1 01/17/2020 1326   AST 18 08/02/2024 0532   ALT 15 08/02/2024 0532   ALKPHOS 82 08/02/2024 0532   BILITOT 0.5 08/02/2024 0532   BILITOT 0.4 01/17/2020 1326   GFRNONAA >60 08/03/2024 0653   GFRAA 104 01/17/2020 1326   Lipase     Component Value Date/Time   LIPASE 24 08/02/2024 0532    Studies/Results: DG Chest 2 View Addendum Date: 08/02/2024 ADDENDUM REPORT: 08/02/2024 07:06 ADDENDUM: Critical Value/emergent results were called by telephone at the time of interpretation on 08/02/2024 at 0559 to provider [facility] , who verbally acknowledged these results. Electronically Signed   By: Camellia Candle M.D.   On: 08/02/2024 07:06   Result Date: 08/02/2024 CLINICAL DATA:  Chest pain EXAM: CHEST - 2 VIEW COMPARISON:  12/17/2013 FINDINGS: Low lung volumes. There is pulmonary vascular congestion without overt pulmonary edema. The cardio pericardial silhouette is enlarged. Lucency identified under each hemidiaphragm, raising concern for intraperitoneal free air. Bones are diffusely demineralized. IMPRESSION: 1. Lucency under each hemidiaphragm, concerning for intraperitoneal free air. CT abdomen and  pelvis with IV contrast recommended to further evaluate. 2. Low volume film with pulmonary vascular congestion. Electronically Signed: By: Camellia Candle M.D. On: 08/02/2024 05:40   CT Angio Chest/Abd/Pel for Dissection W and/or Wo Contrast Result Date: 08/02/2024 CLINICAL DATA:  Pain.  Acute aortic syndrome suspected. EXAM: CT ANGIOGRAPHY CHEST, ABDOMEN AND PELVIS TECHNIQUE: Non-contrast CT of the chest was initially obtained. Multidetector CT imaging through the chest, abdomen and pelvis was performed using the standard protocol during bolus administration of intravenous contrast. Multiplanar reconstructed images  and MIPs were obtained and reviewed to evaluate the vascular anatomy. RADIATION DOSE REDUCTION: This exam was performed according to the departmental dose-optimization program which includes automated exposure control, adjustment of the mA and/or kV according to patient size and/or use of iterative reconstruction technique. CONTRAST:  OMNIPAQUE  IOHEXOL  350 MG/ML SOLN COMPARISON:  None Available. FINDINGS: CTA CHEST FINDINGS Cardiovascular: Pre contrast imaging shows no hyperdense crescent in the wall of the thoracic aorta to suggest the presence of an acute intramural hematoma. Imaging after IV contrast administration shows no dissection of the thoracic aorta. No thoracic aortic aneurysm. No large central pulmonary embolus. Mediastinum/Nodes: No mediastinal lymphadenopathy. There is no hilar lymphadenopathy. Moderate hiatal hernia. The esophagus has normal imaging features. There is no axillary lymphadenopathy. Lungs/Pleura: No suspicious pulmonary nodule or mass. No focal airspace consolidation. There is no evidence of pleural effusion. Down No worrisome lytic or sclerotic osseous abnormality. Musculoskeletal: No worrisome lytic or sclerotic osseous abnormality. Review of the MIP images confirms the above findings. CTA ABDOMEN AND PELVIS FINDINGS VASCULAR Aorta: Normal caliber aorta without aneurysm, dissection, vasculitis or significant stenosis. There is moderate atherosclerotic calcification of the abdominal aorta without aneurysm. Celiac: Patent without evidence of aneurysm, dissection, vasculitis or significant stenosis. SMA: Patent without evidence of aneurysm, dissection, vasculitis or significant stenosis. Renals: Both renal arteries are patent without evidence of aneurysm, dissection, vasculitis, fibromuscular dysplasia or significant stenosis. Accessory right renal artery. IMA: Proximal stenosis not excluded. Inflow: Patent without evidence of aneurysm, dissection, vasculitis or significant  stenosis. Veins: No obvious venous abnormality within the limitations of this arterial phase study. Review of the MIP images confirms the above findings. NON-VASCULAR Hepatobiliary: No suspicious focal abnormality within the liver parenchyma. Gallbladder is distended without substantial pericholecystic edema or fluid. No intrahepatic or extrahepatic biliary dilation. Pancreas: No focal mass lesion. No dilatation of the main duct. No intraparenchymal cyst. No peripancreatic edema. Spleen: No splenomegaly. No suspicious focal mass lesion. Adrenals/Urinary Tract: No adrenal nodule or mass. Kidneys unremarkable. No evidence for hydroureter. The urinary bladder appears normal for the degree of distention. Stomach/Bowel: Gastric wall appears thickened ill-defined with perigastric edema evident. Duodenum is normally positioned as is the ligament of Treitz. No small bowel wall thickening. No small bowel dilatation. Is the terminal ileum nor the appendix are well visualized. No gross colonic mass. No colonic wall thickening. Lymphatic: There is no gastrohepatic or hepatoduodenal ligament lymphadenopathy. No retroperitoneal or mesenteric lymphadenopathy. No pelvic sidewall lymphadenopathy. Reproductive: There is no adnexal mass. Other: No intraperitoneal free fluid. Intraperitoneal free air is identified, predominantly in the abdomen. Musculoskeletal: No worrisome lytic or sclerotic osseous abnormality. Posterior fusion hardware noted L4-5. The pedicle screws bilaterally at L4 exit the superior cortex of each pedicle extending to the posterior L3-4 disc space. Review of the MIP images confirms the above findings. IMPRESSION: 1. No evidence for acute intramural hematoma or dissection of the thoracoabdominal aorta. No thoracoabdominal aortic aneurysm. 2. Intraperitoneal free air, predominantly in the abdomen. Gastric wall  appears thickened and ill-defined with perigastric edema evident. Although the precise source of the  intraperitoneal free air is not evident on the study, gastric origin is favored. 3. Moderate hiatal hernia. Findings of free air were communicated to Dr. Carita after interpretation of the patient's chest x-ray earlier today at 0559 hours. Electronically Signed   By: Camellia Candle M.D.   On: 08/02/2024 07:06    Anti-infectives: Anti-infectives (From admission, onward)    Start     Dose/Rate Route Frequency Ordered Stop   08/02/24 1630  fluconazole  (DIFLUCAN ) IVPB 200 mg        200 mg 100 mL/hr over 60 Minutes Intravenous Every 24 hours 08/02/24 1541 08/07/24 1629   08/02/24 1600  piperacillin -tazobactam (ZOSYN ) IVPB 3.375 g        3.375 g 12.5 mL/hr over 240 Minutes Intravenous Every 8 hours 08/02/24 1541 08/07/24 1359   08/02/24 0615  piperacillin -tazobactam (ZOSYN ) IVPB 3.375 g        3.375 g 100 mL/hr over 30 Minutes Intravenous  Once 08/02/24 0604 08/02/24 0713        Assessment/Plan POD 1 s/p Exploratory laparotomy, Arlyss patch repair of perforated gastric ulcer by Dr. Dasie on 08/02/24 - Path pending - Cont NGT to LIWS. Plan UGI on POD 3 - Cont Zosyn /Fluconazole . Plan 5d post op - Cont JP drain, currently bloody SS  - BID PPI - Check H. Pylori - BID WTD to midline wound. Plan for Vac tomorrow if wound continues to look good. - Multimodal pain control. Avoid NSAIDs - Mobilize, PT - Pulm toilet, wean o2 as able  FEN - NPO, NGT to LIWS, BID PPI, LR at 55ml/hr  VTE - SCDs, Lovenox  held  ID - Zosyn /Fluconazole  Foley - D/c today, TOV, bladder scan PRN  ABL anemia - Hgb 7.9 from 11.1. Monitor. Repeat CBC this PM. Hold AM dose of Lovenox . No current tachycardia or hypotension.  HTN - appreciate IM assistance HLD - appreciate IM assistance MS - appreciate IM assistance   LOS: 1 day    [name], [facility] 08/03/2024, 8:52 AM Please see Amion for pager number during day hours 7:00am-4:30pm

## 2024-08-03 NOTE — Progress Notes (Signed)

## 2024-08-03 NOTE — Progress Notes (Signed)
 Midline dressing change. Pt tolerated well. Pain medication given pain score 5.

## 2024-08-03 NOTE — Progress Notes (Signed)
 Lovenox  held per Ozell Shaper verbal order at bedside d/t hemoglobin 7.9 pending re draw of CBC labs.

## 2024-08-04 LAB — BASIC METABOLIC PANEL WITH GFR
Anion gap: 11 (ref 5–15)
BUN: 9 mg/dL (ref 8–23)
CO2: 26 mmol/L (ref 22–32)
Calcium: 8.7 mg/dL — ABNORMAL LOW (ref 8.9–10.3)
Chloride: 102 mmol/L (ref 98–111)
Creatinine, Ser: 0.76 mg/dL (ref 0.44–1.00)
GFR, Estimated: >60 mL/min (ref >60)
Glucose, Bld: 89 mg/dL (ref 70–99)
Potassium: 3.4 mmol/L — ABNORMAL LOW (ref 3.5–5.1)
Sodium: 139 mmol/L (ref 135–145)

## 2024-08-04 LAB — CBC
HCT: 27 % — ABNORMAL LOW (ref 36.0–46.0)
Hemoglobin: 8.4 g/dL — ABNORMAL LOW (ref 12.0–15.0)
MCH: 28.7 pg (ref 26.0–34.0)
MCHC: 31.1 g/dL (ref 30.0–36.0)
MCV: 92.2 fL (ref 80.0–100.0)
Platelets: 239 K/uL (ref 150–400)
RBC: 2.93 MIL/uL — ABNORMAL LOW (ref 3.87–5.11)
RDW: 13.3 % (ref 11.5–15.5)
WBC: 20.6 K/uL — ABNORMAL HIGH (ref 4.0–10.5)
nRBC: 0 % (ref 0.0–0.2)

## 2024-08-04 LAB — MRSA NEXT GEN BY PCR, NASAL: MRSA by PCR Next Gen: NOT DETECTED

## 2024-08-04 MED ORDER — SODIUM CHLORIDE 0.9% FLUSH: 10.0000 mL | INTRAVENOUS | Status: DC | PRN

## 2024-08-04 MED ORDER — POTASSIUM CHLORIDE 10 MEQ/100ML IV SOLN
10.0000 meq | INTRAVENOUS | Status: AC
  Administered 2024-08-04 (×3): 10 meq via INTRAVENOUS
  Filled 2024-08-04 (×3): qty 100

## 2024-08-04 MED ORDER — POTASSIUM CHLORIDE 10 MEQ/100ML IV SOLN
10.0000 meq | Freq: Once | INTRAVENOUS | Status: AC
  Administered 2024-08-04: 10 meq via INTRAVENOUS
  Filled 2024-08-04: qty 100

## 2024-08-04 MED ORDER — ACETAMINOPHEN 10 MG/ML IV SOLN
1000.0000 mg | Freq: Three times a day (TID) | INTRAVENOUS | Status: DC
  Administered 2024-08-04 – 2024-08-05 (×2): 1000 mg via INTRAVENOUS
  Filled 2024-08-04 (×3): qty 100

## 2024-08-04 MED ORDER — HYDRALAZINE HCL 20 MG/ML IJ SOLN: 5.0000 mg | Freq: Four times a day (QID) | INTRAMUSCULAR | Status: DC | PRN

## 2024-08-04 NOTE — Progress Notes (Signed)
 2 Days Post-Op  Subjective: CC: Patient reports her abdomen feels sore, mainly around the central abdomen where her incision is. Pain is well controlled. No nausea. Passing flatus. No BM. Voiding since foley removal. Worked with therapies yesterday and recommended for CIR. Remains on o2, denies cp or sob.   Afebrile. HR 100. No hypotension. Labs pending.   Objective: Vital signs in last 24 hours: Temp:  [97.7 F (36.5 C)-98.7 F (37.1 C)] 98.2 F (36.8 C) (08/06 0521) Pulse Rate:  [88-100] 100 (08/06 0521) Resp:  [18] 18 (08/06 0521) BP: (120-143)/(77-93) 143/77 (08/06 0521) SpO2:  [96 %-100 %] 98 % (08/06 0521)    Intake/Output from previous day: 08/05 0701 - 08/06 0700 In: 1831 [I.V.:1508.3; IV Piggyback:322.7] Out: 925 [Urine:700; Drains:225] Intake/Output this shift: No intake/output data recorded.  PE: Gen:  Alert, NAD, pleasant Card:  Reg Pulm:  CTAB, no W/R/R, effort normal. On o2 Abd: Soft, mild distension, epigastric and ruq abdominal ttp without rigidity or guarding, some BS. Midline wound clean. JP drain with bloody SS fluid - 225cc/24 hours.  Ext: Trace non-pitting edema bilaterally  Psych: A&Ox3    Lab Results:  Recent Labs    08/03/24 0653 08/03/24 1446  WBC 20.7* 22.5*  HGB 7.9* 8.2*  HCT 24.6* 25.4*  PLT 238 230   BMET Recent Labs    08/02/24 0427 08/03/24 0653  NA 136 137  K 3.7 3.5  CL 99 103  CO2 26 26  GLUCOSE 139* 109*  BUN 18 15  CREATININE 0.99 0.79  CALCIUM  9.2 8.5*   PT/INR No results for input(s): LABPROT, INR in the last 72 hours. CMP     Component Value Date/Time   NA 137 08/03/2024 0653   NA 140 01/17/2020 1326   K 3.5 08/03/2024 0653   CL 103 08/03/2024 0653   CO2 26 08/03/2024 0653   GLUCOSE 109 (H) 08/03/2024 0653   BUN 15 08/03/2024 0653   BUN 19 01/17/2020 1326   CREATININE 0.79 08/03/2024 0653   CALCIUM  8.5 (L) 08/03/2024 0653   PROT 6.9 08/02/2024 0532   PROT 6.3 01/17/2020 1326   ALBUMIN  3.2 (L)  08/02/2024 0532   ALBUMIN  4.1 01/17/2020 1326   AST 18 08/02/2024 0532   ALT 15 08/02/2024 0532   ALKPHOS 82 08/02/2024 0532   BILITOT 0.5 08/02/2024 0532   BILITOT 0.4 01/17/2020 1326   GFRNONAA >60 08/03/2024 0653   GFRAA 104 01/17/2020 1326   Lipase     Component Value Date/Time   LIPASE 24 08/02/2024 0532    Studies/Results: No results found.   Anti-infectives: Anti-infectives (From admission, onward)    Start     Dose/Rate Route Frequency Ordered Stop   08/02/24 1630  fluconazole  (DIFLUCAN ) IVPB 200 mg        200 mg 100 mL/hr over 60 Minutes Intravenous Every 24 hours 08/02/24 1541 08/07/24 1629   08/02/24 1600  piperacillin -tazobactam (ZOSYN ) IVPB 3.375 g        3.375 g 12.5 mL/hr over 240 Minutes Intravenous Every 8 hours 08/02/24 1541 08/07/24 1359   08/02/24 0615  piperacillin -tazobactam (ZOSYN ) IVPB 3.375 g        3.375 g 100 mL/hr over 30 Minutes Intravenous  Once 08/02/24 0604 08/02/24 0713        Assessment/Plan POD 2 s/p Exploratory laparotomy, Arlyss patch repair of perforated gastric ulcer by Dr. Dasie on 08/02/24 - Path with Inflammatory exudate consistent with origin from an ulcer. No mucosal tissue present.  -  Cont NGT to LIWS. Plan UGI on POD 3 - Cont Zosyn /Fluconazole . Plan 5d post op - Cont JP drain, currently bloody SS  - BID PPI - Check H. Pylori - BID WTD to midline wound. Will order Vac - Multimodal pain control. Avoid NSAIDs - Mobilize, PT/OT - currently recommending CIR - Pulm toilet, wean o2 as able  FEN - NPO, NGT to LIWS, BID PPI, LR at 75ml/hr  VTE - SCDs, start Lovenox  tonight if hgb stable ID - Zosyn /Fluconazole  Foley - Out POD 1, spont void.   ABL anemia - Some bloody output in drain POD 1 with Hgb drop to 7.9 from 11.1  - PM hgb stable. Repeat hgb pending this AM. Monitor.  HTN - appreciate IM assistance HLD - appreciate IM assistance MS - appreciate IM assistance   LOS: 2 days    Ozell CHRISTELLA Shaper, [facility] Surgery 08/04/2024, 9:57 AM Please see Amion for pager number during day hours 7:00am-4:30pm

## 2024-08-04 NOTE — Consult Note (Signed)
 WOC Nurse Consult Note: Reason for Consult:Initiate VAC therapy to midline incision.  Wound type:surgical Pressure Injury POA: NA Measurement: 15 cm x 5 cm x 5 cm  Wound bed: 75% adipose 25% pale pink nongranulating.  Blue suture noted in  Drainage (amount, consistency, odor)  Periwound: Intact Dressing procedure/placement/frequency: 2pieces black foam, covered with drape and TRAC pad.  Seal achieved at 125 mmhg.  Change Monday and Thursday.  Will follow.  Darice Cooley MSN, RN, FNP-BC CWON Wound, Ostomy, Continence Nurse Outpatient [facility] [phone] Pager [phone]

## 2024-08-04 NOTE — Progress Notes (Signed)
 Physical Therapy Treatment Patient Details Name: [name] MRN: [id number] DOB: [birth year] Today's Date: 08/04/2024   History of Present Illness 63 y.o. female presents to [facility] hospital on 08/02/2024 with abdominal pain. Imaging with free air in abdomen. Pt underwent ex-lap with repair of perforated gastric ulcer on 8/4. PMH includes HTN, HLD, MS, OA.    PT Comments  Pt seen for PT tx with pt agreeable, daughter present & supportive/encouraging throughout session. Pt is progressing well with mobility, able to complete bed mobility with supervision but does rely on hospital bed features. Pt ambulates short distance in room with RW & CGA+2, tolerates standing, marching in place for BLE strengthening. Pt does note fatigue after activity. Pt is hopeful to return to mod I PLOF. Recommend post acute rehab >3 hours therapy/day upon d/c.    If plan is discharge home, recommend the following: Assistance with cooking/housework;Assist for transportation;Help with stairs or ramp for entrance;A little help with walking and/or transfers;A little help with bathing/dressing/bathroom   Can travel by private vehicle        Equipment Recommendations  Other (comment) (defer to next venue)    Recommendations for Other Services Rehab consult     Precautions / Restrictions Precautions Precautions: Fall Precaution/Restrictions Comments: NG tube to suction, JP drain Restrictions Weight Bearing Restrictions Per Provider Order: No     Mobility  Bed Mobility Overal bed mobility: Needs Assistance Bed Mobility: Rolling, Sidelying to Sit Rolling: Supervision, Used rails Sidelying to sit: Supervision, HOB elevated, Used rails       General bed mobility comments: Pt able to recall log rolling technique (from previous back sx) without cuing    Transfers Overall transfer level: Needs assistance Equipment used: Rolling walker (2 wheels) Transfers: Sit to/from Stand Sit to Stand: Contact guard assist, +2  safety/equipment           General transfer comment: sit>stand from EOB, recliner, cuing re: hand placement    Ambulation/Gait Ambulation/Gait assistance: Contact guard assist, +2 safety/equipment Gait Distance (Feet): 5 Feet Assistive device: Rolling walker (2 wheels) Gait Pattern/deviations: Decreased step length - left, Decreased step length - right, Decreased stride length Gait velocity: decreased     General Gait Details: Pt ambulates with BLE hips/knees flexed throughout, report she's unable to fully extend them at baseline 2/2 BLE knee OA & need for TKAs.   Stairs             Wheelchair Mobility     Tilt Bed    Modified Rankin (Stroke Patients Only)       Balance Overall balance assessment: Needs assistance Sitting-balance support: Single extremity supported, Feet supported Sitting balance-Leahy Scale: Fair Sitting balance - Comments: supervision sitting EOB   Standing balance support: Bilateral upper extremity supported, Reliant on assistive device for balance Standing balance-Leahy Scale: Fair                              Hotel manager: No apparent difficulties  Cognition Arousal: Alert Behavior During Therapy: WFL for tasks assessed/performed   PT - Cognitive impairments: No apparent impairments                       PT - Cognition Comments: pleasant, joking with therapist throughout session, motivated to get better/return to PLOF Following commands: Intact      Cueing Cueing Techniques: Verbal cues  Exercises Other Exercises Other Exercises: Pt stood & performed marching  in place ~ 1 minute with BUE support on RW, CGA for balance.    General Comments General comments (skin integrity, edema, etc.): Pt received on 2L/min, weaned to room air with SpO2 dropping to 88% after ambulating to chair (pt reports she was holding her breath during mobility), increases to >/= 90% with rest.       Pertinent Vitals/Pain Pain Assessment Pain Assessment: 0-10 Pain Score: 6  Pain Location: abdomen Pain Descriptors / Indicators: Discomfort, Sore Pain Intervention(s): Monitored during session, Limited activity within patient's tolerance    Home Living                          Prior Function            PT Goals (current goals can now be found in the care plan section) Acute Rehab PT Goals Patient Stated Goal: to return to prior level of function PT Goal Formulation: With patient Time For Goal Achievement: 08/17/24 Potential to Achieve Goals: Good Progress towards PT goals: Progressing toward goals    Frequency    Min 3X/week      PT Plan      Co-evaluation PT/OT/SLP Co-Evaluation/Treatment: Yes Reason for Co-Treatment: For patient/therapist safety (pt noted to require max assist +2 yesterday) PT goals addressed during session: Mobility/safety with mobility;Balance;Proper use of DME        AM-PAC PT 6 Clicks Mobility   Outcome Measure  Help needed turning from your back to your side while in a flat bed without using bedrails?: None Help needed moving from lying on your back to sitting on the side of a flat bed without using bedrails?: A Little Help needed moving to and from a bed to a chair (including a wheelchair)?: A Little Help needed standing up from a chair using your arms (e.g., wheelchair or bedside chair)?: A Little Help needed to walk in hospital room?: A Little Help needed climbing 3-5 steps with a railing? : A Lot 6 Click Score: 18    End of Session Equipment Utilized During Treatment: Oxygen Activity Tolerance: Patient tolerated treatment well;Patient limited by fatigue Patient left: in chair;with call bell/phone within reach;with chair alarm set;with family/visitor present Nurse Communication: Mobility status (O2) PT Visit Diagnosis: Other abnormalities of gait and mobility (R26.89);Muscle weakness (generalized) (M62.81);Pain Pain -  part of body:  (abdomen)     Time: 9046-8972 PT Time Calculation (min) (ACUTE ONLY): 34 min  Charges:    $Therapeutic Activity: 8-22 mins PT General Charges $$ ACUTE PT VISIT: 1 Visit                     Richerd Pinal, PT, DPT 08/04/24, 11:17 AM   Richerd CHRISTELLA Pinal 08/04/2024, 11:16 AM

## 2024-08-04 NOTE — Progress Notes (Signed)
 Inpatient Rehab Admissions Coordinator:   Consult received and chart reviewed.  Note POD 2 for ex lap with graham patch repair of perforated gastric ulcer.  Wound vac to midline wound started today.  She still has NGT to LIWS, which is a barrier to CIR.  Planning to UGI tomorrow.  I will f/u tomorrow vs. Friday depending on ability to advance diet and after UGI.    Reche Lowers, PT, DPT Admissions Coordinator [phone] 08/04/24  1:05 PM

## 2024-08-04 NOTE — Plan of Care (Signed)
  Problem: Clinical Measurements: Goal: Will remain free from infection Outcome: Progressing Goal: Diagnostic test results will improve Outcome: Progressing   Problem: Activity: Goal: Risk for activity intolerance will decrease Outcome: Progressing   Problem: Coping: Goal: Level of anxiety will decrease Outcome: Progressing   Problem: Pain Managment: Goal: General experience of comfort will improve and/or be controlled Outcome: Progressing   Problem: Safety: Goal: Ability to remain free from injury will improve Outcome: Progressing

## 2024-08-04 NOTE — TOC Initial Note (Signed)
 Transition of Care (TOC) - Initial/Assessment Note   Spoke to patient and family at bedside.   Patient from home alone, however family can provide 24/7 assistance at discharge.   Patient has shower chair, hand held shower head, walker, Rollator, reachers and sock assist at home already.   PT recommending [facility]. Rehab MD to see today. PAtient aware if CIR offers bed they will submit for insurance authorization.   Plan to place wound VAC today . If discharge plan changes to home with home health will need prescription for home [facility] and orders for [facility].   Care Management will continue to follow for disposition needs  Patient Details  Name: [name] MRN: [id number] Date of Birth: 02/18/1961  Transition of Care [facility]) CM/SW Contact:    Stephane Powell Jansky, RN Phone Number: 08/04/2024, 12:03 PM  Clinical Narrative:                   Expected Discharge Plan: IP Rehab Facility Barriers to Discharge: Continued Medical Work up   Patient Goals and CMS Choice Patient states their goals for this hospitalization and ongoing recovery are:: more rehab CMS Medicare.gov Compare Post Acute Care list provided to:: Patient Choice offered to / list presented to : Patient      Expected Discharge Plan and Services   Discharge Planning Services: CM Consult Post Acute Care Choice: IP Rehab Living arrangements for the past 2 months: Single Family Home                 DME Arranged: N/A DME Agency: NA       HH Arranged:  (see note)          Prior Living Arrangements/Services Living arrangements for the past 2 months: Single Family Home Lives with:: Self Patient language and need for interpreter reviewed:: Yes Do you feel safe going back to the place where you live?: Yes      Need for Family Participation in Patient Care: Yes (Comment) Care giver support system in place?: Yes (comment) Current home services: DME Criminal Activity/Legal Involvement Pertinent to  Current Situation/Hospitalization: No - Comment as needed  Activities of Daily Living      Permission Sought/Granted   Permission granted to share information with : Yes, Verbal Permission Granted  Share Information with NAME: family at bedside  Permission granted to share info w AGENCY: [facility]        Emotional Assessment Appearance:: Appears stated age Attitude/Demeanor/Rapport: Engaged Affect (typically observed): Appropriate Orientation: : Oriented to Self, Oriented to Place, Oriented to  Time, Oriented to Situation Alcohol / Substance Use: Not Applicable Psych Involvement: No (comment)  Admission diagnosis:  Atypical chest pain [R07.89] Perforated abdominal viscus [R19.8] Perforated gastric ulcer (HCC) [K25.5] Patient Active Problem List   Diagnosis Date Noted   Perforated gastric ulcer (HCC) 08/02/2024   Perforated abdominal viscus 08/02/2024   Postoperative seroma of subcutaneous tissue after non-dermatologic procedure 04/11/2021   Wound drainage 02/21/2021   Wound dehiscence 02/21/2021   Postoperative seroma of musculoskeletal structure after musculoskeletal procedure 12/05/2020   Spondylolisthesis of lumbar region 11/02/2020   Insulin  resistance 02/15/2020   Class 3 severe obesity with serious comorbidity and body mass index (BMI) of 45.0 to 49.9 in adult 01/19/2020   Vitamin D  deficiency 01/18/2020   Other hyperlipidemia 02/22/2019   Primary osteoarthritis of both knees 10/14/2018   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   Essential hypertension 02/16/2016   Multiple sclerosis (HCC) 04/06/2013   PCP:  Card, Norleen SQUIBB, MD Pharmacy:   [facility] [phone] [address], [place] - [address] [place] [address] Phone: [phone] Fax: [phone]     Social Drivers of Health (SDOH) Social History: SDOH Screenings   Food Insecurity: No Food Insecurity (08/02/2024)  Housing: Low Risk  (08/02/2024)  Transportation Needs: No  Transportation Needs (08/02/2024)  Utilities: Not At Risk (08/02/2024)  Depression (PHQ2-9): Medium Risk (01/17/2020)  Financial Resource Strain: Low Risk  (04/01/2024)   Received from [facility]  Physical Activity: Unknown (04/01/2024)   Received from [facility]  Social Connections: Unknown (08/03/2024)  Stress: No Stress Concern Present (04/01/2024)   Received from [facility]  Tobacco Use: Low Risk  (08/02/2024)   SDOH Interventions:     Readmission Risk Interventions     No data to display

## 2024-08-04 NOTE — Progress Notes (Signed)
 HD#2 SUBJECTIVE:  Patient Summary: [name] is a 63 y.o. with PMH of HTN, HLD, chronic back pain s/p L4-5 fusion, MS without recent flare, vitamin D  deficiency presented with left sided chest and abdominal pain admitted by general surgery team for pneumoperitoneum c/f perforated viscus.A CT scan showed free intraabdominal air with gastric wall thickening. After a discussion of the risks and benefits of surgery, she agreed to proceed with abdominal exploration   Overnight Events: N/A  Interim History:   She is in pain after surgery, denied N/V and any new concern  OBJECTIVE:  Vital Signs: Vitals:   08/03/24 1200 08/03/24 1700 08/03/24 1956 08/04/24 0521  BP: 131/84 121/80 (!) 120/93 (!) 143/77  Pulse: 90 88 93 100  Resp: 18 18 18 18   Temp: 98.1 F (36.7 C) 97.7 F (36.5 C) 98.7 F (37.1 C) 98.2 F (36.8 C)  TempSrc: Oral Oral Oral Oral  SpO2: 99% 96% 100% 98%  Weight:      Height:       Supplemental O2: Nasal Cannula SpO2: 98 % O2 Flow Rate (L/min): 2 L/min  Filed Weights   08/02/24 0420 08/02/24 0848  Weight: 112.9 kg 112.9 kg     Intake/Output Summary (Last 24 hours) at 08/04/2024 1325 Last data filed at 08/04/2024 0800 Gross per 24 hour  Intake 1830.97 ml  Output 410 ml  Net 1420.97 ml   Net IO Since Admission: 3,239.55 mL [08/04/24 1325]  Physical Exam: Physical Exam General appearance: alert, cooperative, fatigued, and no distress Neck: no adenopathy and supple, symmetrical, trachea midline Resp: clear to auscultation bilaterally Cardio: regular rate and rhythm GI: normal findings: soft, non-tender and abnormal findings:  hypoactive bowel sounds Extremities: edema none Abd: Soft, mildly distended with upper abdominal tenderness; no rigidity or guarding. Some bowel sounds. Midline wound clean.   Patient Lines/Drains/Airways Status     Active Line/Drains/Airways     Name Placement date Placement time Site Days   Midline Single Lumen 08/04/24  Left Cephalic 8 cm 0 cm 08/04/24  0019  Cephalic  less than 1   Closed System Drain 1 Right Abdomen Bulb (JP) 19 Fr. 08/02/24  1034  Abdomen  2   Negative Pressure Wound Therapy Back 04/11/21  1453  --  1211   NG/OG Vented/Dual Lumen 16 Fr. Right nare External length of tube 58 cm 08/02/24  1108  Right nare  2   Wound 08/02/24 1018 Surgical Closed Surgical Incision Abdomen Other (Comment) 08/02/24  1018  Abdomen  2            Pertinent labs and imaging:      Latest Ref Rng & Units 08/04/2024    9:53 AM 08/03/2024    2:46 PM 08/03/2024    6:53 AM  CBC  WBC 4.0 - 10.5 K/uL 20.6  22.5  20.7   Hemoglobin 12.0 - 15.0 g/dL 8.4  8.2  7.9  C  Hematocrit 36.0 - 46.0 % 27.0  25.4  24.6   Platelets 150 - 400 K/uL 239  230  238     C Corrected result       Latest Ref Rng & Units 08/04/2024    9:53 AM 08/03/2024    6:53 AM 08/02/2024    5:32 AM  CMP  Glucose 70 - 99 mg/dL 89  890    BUN 8 - 23 mg/dL 9  15    Creatinine 9.55 - 1.00 mg/dL 9.23  9.20    Sodium 864 -  145 mmol/L 139  137    Potassium 3.5 - 5.1 mmol/L 3.4  3.5    Chloride 98 - 111 mmol/L 102  103    CO2 22 - 32 mmol/L 26  26    Calcium  8.9 - 10.3 mg/dL 8.7  8.5    Total Protein 6.5 - 8.1 g/dL   6.9   Total Bilirubin 0.0 - 1.2 mg/dL   0.5   Alkaline Phos 38 - 126 U/L   82   AST 15 - 41 U/L   18   ALT 0 - 44 U/L   15     No results found.  ASSESSMENT/PLAN:  Assessment: Principal Problem:   Perforated gastric ulcer (HCC) Active Problems:   Perforated abdominal viscus   Plan: [name] is a 63 y.o. with PMH of HTN, HLD, chronic back pain s/p L4-5 fusion, MS without recent flare, vitamin D  deficiency presented with left sided chest and abdominal pain admitted by general surgery team for pneumoperitoneum c/f perforated viscus.A CT scan showed free intraabdominal air with gastric wall thickening. After a discussion of the risks and benefits of surgery, she agreed to proceed with abdominal exploration.  Thank you for  consulting Internal Medicine. Management plans for the patient's chronic conditions, including multiple sclerosis, hypertension, and hyperlipidemia, are outlined in the Assessment and Plan. At this time, we recommend holding these chronic medications as clinically appropriate.  - For multiple sclerosis, resume medications once the patient is able to take oral intake. - For hyperlipidemia, please resume atorvastatin when the patient is tolerating oral medications. - For hypertension, we have added hydralazine  for blood pressure management if systolic readings exceed 180/110 and the patient is unable to take oral medications. Once the patient can tolerate oral intake, please resume home antihypertensive medications.  There are no further acute medical needs requiring Internal Medicine involvement. We are signing off from the case but are happy to assist again if needed. Please don't hesitate to reach out if we can be of further help. Thank you again for your consult.   #Pneumoperitoneum c/f perforated viscus #Abdominal Pain #Tachycardia  #Leukocytosis #Pneumoperitoneum c/f perforated viscus The patient presents with pneumoperitoneum likely secondary to a perforated viscus, suspected to be of gastric origin given the clinical presentation and CT findings. She has abdominal pain, leukocytosis, and tachycardia, likely due to the acute infectious and inflammatory process. She has a history of chronic NSAID use, which may have contributed to gastric ulceration and perforation. Cardiac causes for her symptoms are unlikely given negative troponin and EKG without ischemic changes. The patient was taken emergently to the OR for surgical repair and is currently in the postoperative period with ongoing management. WBC increased from 13 to 20, likely reflecting a postoperative response.JP drain with 170 cc bloody serosanguinous fluid in 24 hrs, some clot/sediment noted. Plan:  - Continue general surgery  management. - Continue IV antibiotics: Zosyn  and Fluconazole   - Monitor Jackson-Pratt drain output; currently bloody serosanguinous. - Administer IV proton pump inhibitors twice daily. - Test for H. pylori. - Perform pulmonary toilet and wean oxygen as tolerated. - Maintain NPO status until further evaluation. - Continue IV fluids as per primary team. #Hemoglobin drop Hemoglobin raised from 7.9 to 8.2. Possibly due to surgical blood loss, though intraoperative bleeding was minimal and there is no evidence of active bleeding from the surgical site.  No current tachycardia or hypotension.  - Check hemoglobin regularly #Normocytic anemia Noted in past CBC. BL around 11. Unclear of  etiology, does have hx of chronic NSAID use and coming in for possible perforation. Will obtain labs including B12, folate, iron studies. Denies sign of bleeding at this time.  - Trend CBC and follow up anemia labs   #HTN Average blood pressure is 120/80. - Hold home losartan  100 mg and hydrochlorothiazide  25 mg  - we have added hydralazine  for blood pressure management if systolic readings exceed 180/110 and the patient is unable to take oral medications. Once the patient can tolerate oral intake, please resume home antihypertensive medications. #HLD - Hold home rosuvastatin   - please resume atorvastatin when the patient is tolerating oral medications. #Multiple sclerosis Patient denies any recent flare with last flareup 20+ years.  No acute concerns of symptoms at this time. She is adherent to her teriflunomide , last taken last night 8/3.  Will resume after postop.  She follows with neurology outpatient. On modafinil  for fatigue. Hepatic function panel normal.  - Hold home teriflunomide  14 mg and modafinil  d- - resumed medications once the patient is able to take oral intake. #Chronic back pain Hx of L4-5 fusion. Follows with [facility]. PTA reports taking diclofenac BID and gabapentin  BID for  chronic pains.  - robaxin  500 iv q8 - Dilaudid  Q2h PRN - Stop NSAID use - Per surgery: Multimodal pain control. Avoid NSAIDs       Best Practice: Diet: NPO IVF: Fluids: LR, Rate: 75 cc/ h VTE: enoxaparin  (LOVENOX ) injection 40 mg Start: 08/03/24 0800 SCDs Start: 08/02/24 1542 Code: Full   Disposition planning: Family Contact: Son, called and notified. DISPO: Anticipated discharge in 3 days to Home pending clinical improvement.  Signature:  Adair Lauderback [facility]ad[facility]  1:25 PM, 08/04/2024  On Call pager [phone]

## 2024-08-04 NOTE — Evaluation (Signed)
 Occupational Therapy Evaluation Patient Details Name: [name] MRN: [id number] DOB: 1961/02/17 Today's Date: 08/04/2024   History of Present Illness   63 y.o. female presents to [facility] hospital on 08/02/2024 with abdominal pain. Imaging with free air in abdomen. Pt underwent ex-lap with repair of perforated gastric ulcer on 8/4. PMH includes HTN, HLD, MS, OA.     Clinical Impressions At baseline, pt is Ind to Mod I for ADLs and light meal prep, receives assistance from family for IADLs, and performs functional mobility Mod I with a RW or Rollator. Pt now presents with decreased activity tolerance, decreased balance, impaired cardiopulmonary status, and decreased safety and independence with functional tasks. Pt currently demonstrating ability to largely complete ADLs Ind to Max assist +2 for safety/equipment and functional transfers/mobility with a RW with Contact guard assist +2 for safety/equipment.Pt participated well in session, is motivated to return to PLOF, and has good family support. Pt will benefit from acute skilled OT services to address deficits and increase safety and independence with functional tasks. Post acute discharge, pt will benefit from intensive inpatient skilled rehab services > 3 hours per day to maximize rehab potential.      If plan is discharge home, recommend the following:   Two people to help with walking and/or transfers;Two people to help with bathing/dressing/bathroom;Assistance with cooking/housework;Assist for transportation;Help with stairs or ramp for entrance     Functional Status Assessment   Patient has had a recent decline in their functional status and demonstrates the ability to make significant improvements in function in a reasonable and predictable amount of time.     Equipment Recommendations   None recommended by OT (Pt already has needed equipment.)     Recommendations for Other Services   Rehab consult      Precautions/Restrictions   Precautions Precautions: Fall Recall of Precautions/Restrictions: Intact Precaution/Restrictions Comments: NG tube to suction, JP drain Restrictions Weight Bearing Restrictions Per Provider Order: No     Mobility Bed Mobility Overal bed mobility: Needs Assistance Bed Mobility: Rolling, Sidelying to Sit Rolling: Supervision, Used rails Sidelying to sit: Supervision, HOB elevated, Used rails       General bed mobility comments: Pt able to recall log rolling technique (from previous back sx) without cuing. Pt requires increased time.    Transfers Overall transfer level: Needs assistance Equipment used: Rolling walker (2 wheels) Transfers: Sit to/from Stand, Bed to chair/wheelchair/BSC Sit to Stand: Contact guard assist, +2 safety/equipment     Step pivot transfers: Contact guard assist, +2 safety/equipment     General transfer comment: sit>stand from EOB, recliner, cuing re: hand placement      Balance Overall balance assessment: Needs assistance Sitting-balance support: Single extremity supported, Feet supported Sitting balance-Leahy Scale: Fair Sitting balance - Comments: supervision sitting EOB   Standing balance support: Bilateral upper extremity supported, Reliant on assistive device for balance Standing balance-Leahy Scale: Fair                             ADL either performed or assessed with clinical judgement   ADL Overall ADL's : Needs assistance/impaired Eating/Feeding: Independent;Sitting   Grooming: Set up;Sitting   Upper Body Bathing: Minimal assistance;Sitting;Cueing for compensatory techniques Upper Body Bathing Details (indicate cue type and reason): simulated Lower Body Bathing: Maximal assistance;+2 for safety/equipment;Sit to/from stand;Sitting/lateral leans;Cueing for compensatory techniques Lower Body Bathing Details (indicate cue type and reason): simulated Upper Body Dressing : Set up;Contact guard  assist;Sitting  Lower Body Dressing: Maximal assistance;+2 for safety/equipment;Sit to/from stand;Sitting/lateral leans;Cueing for compensatory techniques   Toilet Transfer: Contact guard assist;+2 for safety/equipment;Ambulation;BSC/3in1;Rolling walker (2 wheels);Requires wide/bariatric   Toileting- Clothing Manipulation and Hygiene: Sit to/from stand;Minimal assistance;Moderate assistance;Sitting/lateral lean;Cueing for compensatory techniques       Functional mobility during ADLs: Contact guard assist;+2 for safety/equipment;Rolling walker (2 wheels) General ADL Comments: Pt with decreased activity tolerance, fatiguing quickly during tasks. Pt requires increased time and effort during tasks.     Vision Baseline Vision/History: 1 Wears glasses Ability to See in Adequate Light: 0 Adequate (with glasses) Patient Visual Report: No change from baseline Vision Assessment?: No apparent visual deficits Additional Comments: WFL for tasks assessed; not formally screened or evaluated     Perception         Praxis         Pertinent Vitals/Pain Pain Assessment Pain Assessment: 0-10 Pain Score: 6  Pain Location: abdomen Pain Descriptors / Indicators: Discomfort, Sore Pain Intervention(s): Limited activity within patient's tolerance, Monitored during session, Repositioned     Extremity/Trunk Assessment Upper Extremity Assessment Upper Extremity Assessment: Right hand dominant;Overall WFL for tasks assessed (Gross B UE strength 4 to 4+/5)   Lower Extremity Assessment Lower Extremity Assessment: Defer to PT evaluation   Cervical / Trunk Assessment Cervical / Trunk Assessment: Other exceptions Cervical / Trunk Exceptions: abdominal surgical incision; JP drain; increased body habitus   Communication Communication Communication: No apparent difficulties   Cognition Arousal: Alert Behavior During Therapy: WFL for tasks assessed/performed Cognition: No apparent impairments              OT - Cognition Comments: Pt AAOx4 and pleasant throughout session.                 Following commands: Intact       Cueing  General Comments   Cueing Techniques: Verbal cues  Pt received on 2L/min, weaned to room air with SpO2 dropping to 88% after ambulating to chair (pt reports she was holding her breath during mobility), increases to >/= 90% with rest. Pt's daughter present and supportive throughout session.   Exercises     Shoulder Instructions      Home Living Family/patient expects to be discharged to:: Private residence Living Arrangements: Alone Available Help at Discharge: Family;Available PRN/intermittently (family visits daily to assist with IADLs) Type of Home: House Home Access: Stairs to enter;Ramped entrance Entrance Stairs-Number of Steps: 3 Entrance Stairs-Rails: Right Home Layout: One level     Bathroom Shower/Tub: Chief Strategy Officer: Handicapped height     Home Equipment: Agricultural consultant (2 wheels);Rollator (4 wheels);Tub bench;Grab bars - toilet;Grab bars - tub/shower;Adaptive equipment;Hand held shower head;Lift chair;Other (comment) (bed transfer handle) Adaptive Equipment: Reacher;Sock aid;Long-handled sponge        Prior Functioning/Environment Prior Level of Function : Independent/Modified Independent;Needs assist             Mobility Comments: ambulates with use of RW or Rollator, negotiates stairs backward with RW ADLs Comments: Ind to Mod I for ADLs and light meal prep sitting on Rollator; assist with other IADLs; pt limited by B knee pain/OA; pt enjoys traveling and hopes to travel with family accross the [place]  and internationally, including to [place]    OT Problem List: Decreased activity tolerance;Impaired balance (sitting and/or standing);Decreased knowledge of use of DME or AE;Decreased knowledge of precautions;Cardiopulmonary status limiting activity;Pain   OT Treatment/Interventions: Self-care/ADL  training;Therapeutic exercise;Energy conservation;DME and/or AE instruction;Therapeutic activities;Patient/family education;Balance training  OT Goals(Current goals can be found in the care plan section)   Acute Rehab OT Goals Patient Stated Goal: to heal well, return home, and lose more weight so that she can qualify for B TKAs OT Goal Formulation: With patient/family Time For Goal Achievement: 08/18/24 Potential to Achieve Goals: Good ADL Goals Pt Will Perform Grooming: with supervision;standing Pt Will Perform Lower Body Bathing: with min assist;with adaptive equipment;sitting/lateral leans;sit to/from stand Pt Will Perform Lower Body Dressing: with min assist;with adaptive equipment;sitting/lateral leans;sit to/from stand Pt Will Transfer to Toilet: with supervision;ambulating;bedside commode (with least restrictive AD) Pt Will Perform Toileting - Clothing Manipulation and hygiene: with contact guard assist;sitting/lateral leans;sit to/from stand (with adaptive equipment as needed)   OT Frequency:  Min 2X/week    Co-evaluation PT/OT/SLP Co-Evaluation/Treatment: Yes Reason for Co-Treatment: For patient/therapist safety (pt noted to require max assist +2 yesterday)   OT goals addressed during session: ADL's and self-care;Strengthening/ROM      AM-PAC OT 6 Clicks Daily Activity     Outcome Measure Help from another person eating meals?: None Help from another person taking care of personal grooming?: A Little Help from another person toileting, which includes using toliet, bedpan, or urinal?: A Lot Help from another person bathing (including washing, rinsing, drying)?: A Lot Help from another person to put on and taking off regular upper body clothing?: A Little Help from another person to put on and taking off regular lower body clothing?: A Lot 6 Click Score: 16   End of Session Equipment Utilized During Treatment: Rolling walker (2 wheels);Oxygen Nurse Communication:  Mobility status;Other (comment) (Pt able to use [facility] with assistance of nursing staff and use of RW)  Activity Tolerance: Patient tolerated treatment well Patient left: in chair;with call bell/phone within reach;with chair alarm set;with family/visitor present  OT Visit Diagnosis: Unsteadiness on feet (R26.81);Other abnormalities of gait and mobility (R26.89);Other (comment);Pain (decreased activity tolerance)                Time: 9046-8972 OT Time Calculation (min): 34 min Charges:  OT General Charges $OT Visit: 1 Visit OT Evaluation $OT Eval Moderate Complexity: 1 Mod  Margarie Rockey HERO., OTR/L, MA Acute Rehab [phone]   Margarie FORBES Horns 08/04/2024, 4:02 PM

## 2024-08-04 NOTE — Plan of Care (Signed)
  Problem: Pain Managment: Goal: General experience of comfort will improve and/or be controlled Outcome: Progressing   Problem: Safety: Goal: Ability to remain free from injury will improve Outcome: Progressing   Problem: Skin Integrity: Goal: Risk for impaired skin integrity will decrease Outcome: Progressing

## 2024-08-05 ENCOUNTER — Inpatient Hospital Stay (HOSPITAL_COMMUNITY)

## 2024-08-05 LAB — CBC
HCT: 26.6 % — ABNORMAL LOW (ref 36.0–46.0)
Hemoglobin: 8.5 g/dL — ABNORMAL LOW (ref 12.0–15.0)
MCH: 28.7 pg (ref 26.0–34.0)
MCHC: 32 g/dL (ref 30.0–36.0)
MCV: 89.9 fL (ref 80.0–100.0)
Platelets: 256 K/uL (ref 150–400)
RBC: 2.96 MIL/uL — ABNORMAL LOW (ref 3.87–5.11)
RDW: 13.2 % (ref 11.5–15.5)
WBC: 16.4 K/uL — ABNORMAL HIGH (ref 4.0–10.5)
nRBC: 0 % (ref 0.0–0.2)

## 2024-08-05 LAB — BASIC METABOLIC PANEL WITH GFR
Anion gap: 12 (ref 5–15)
BUN: 8 mg/dL (ref 8–23)
CO2: 24 mmol/L (ref 22–32)
Calcium: 8.7 mg/dL — ABNORMAL LOW (ref 8.9–10.3)
Chloride: 102 mmol/L (ref 98–111)
Creatinine, Ser: 0.73 mg/dL (ref 0.44–1.00)
GFR, Estimated: >60 mL/min (ref >60)
Glucose, Bld: 92 mg/dL (ref 70–99)
Potassium: 3.5 mmol/L (ref 3.5–5.1)
Sodium: 138 mmol/L (ref 135–145)

## 2024-08-05 MED ORDER — HYDROCHLOROTHIAZIDE 25 MG PO TABS
25.0000 mg | ORAL_TABLET | Freq: Every morning | ORAL | Status: DC
  Filled 2024-08-05: qty 1

## 2024-08-05 MED ORDER — OXYCODONE HCL 5 MG PO TABS
5.0000 mg | ORAL_TABLET | ORAL | Status: DC | PRN
  Administered 2024-08-05 – 2024-08-07 (×5): 5 mg via ORAL
  Administered 2024-08-07: 10 mg via ORAL
  Administered 2024-08-07 – 2024-08-08 (×2): 5 mg via ORAL
  Administered 2024-08-08 (×3): 10 mg via ORAL
  Administered 2024-08-09 (×5): 5 mg via ORAL
  Administered 2024-08-09: 10 mg via ORAL
  Administered 2024-08-09: 5 mg via ORAL
  Administered 2024-08-09: 10 mg via ORAL
  Administered 2024-08-10 – 2024-08-11 (×22): 5 mg via ORAL
  Administered 2024-08-12: 10 mg via ORAL
  Administered 2024-08-12 (×2): 5 mg via ORAL
  Administered 2024-08-12: 10 mg via ORAL
  Administered 2024-08-12 – 2024-08-13 (×4): 5 mg via ORAL
  Administered 2024-08-13: 10 mg via ORAL
  Administered 2024-08-13 – 2024-08-14 (×2): 5 mg via ORAL
  Administered 2024-08-14 – 2024-08-15 (×3): 10 mg via ORAL
  Administered 2024-08-15 – 2024-08-16 (×3): 5 mg via ORAL
  Administered 2024-08-16: 10 mg via ORAL
  Administered 2024-08-16 – 2024-08-17 (×3): 5 mg via ORAL
  Filled 2024-08-05: qty 2
  Filled 2024-08-05 (×2): qty 1
  Filled 2024-08-05: qty 2
  Filled 2024-08-05 (×4): qty 1
  Filled 2024-08-05 (×2): qty 2
  Filled 2024-08-05 (×2): qty 1
  Filled 2024-08-05: qty 2
  Filled 2024-08-05 (×6): qty 1
  Filled 2024-08-05: qty 2
  Filled 2024-08-05 (×4): qty 1
  Filled 2024-08-05: qty 2
  Filled 2024-08-05: qty 1
  Filled 2024-08-05 (×2): qty 2
  Filled 2024-08-05 (×2): qty 1
  Filled 2024-08-05: qty 2
  Filled 2024-08-05 (×4): qty 1
  Filled 2024-08-05: qty 2
  Filled 2024-08-05 (×3): qty 1
  Filled 2024-08-05 (×2): qty 2
  Filled 2024-08-05 (×3): qty 1
  Filled 2024-08-05: qty 2
  Filled 2024-08-05 (×4): qty 1

## 2024-08-05 MED ORDER — ACETAMINOPHEN 500 MG PO TABS: 1000.0000 mg | ORAL_TABLET | Freq: Four times a day (QID) | ORAL | Status: DC | PRN

## 2024-08-05 MED ORDER — ACETAMINOPHEN 500 MG PO TABS
1000.0000 mg | ORAL_TABLET | Freq: Three times a day (TID) | ORAL | Status: DC
  Filled 2024-08-05: qty 2

## 2024-08-05 MED ORDER — HYDROMORPHONE HCL 1 MG/ML IJ SOLN
0.5000 mg | INTRAMUSCULAR | Status: DC | PRN
  Administered 2024-08-05 – 2024-08-16 (×2): 0.5 mg via INTRAVENOUS
  Filled 2024-08-05 (×2): qty 0.5

## 2024-08-05 MED ORDER — IOHEXOL 300 MG/ML  SOLN
100.0000 mL | Freq: Once | INTRAMUSCULAR | Status: AC | PRN
  Administered 2024-08-05: 100 mL

## 2024-08-05 MED ORDER — ROSUVASTATIN CALCIUM 5 MG PO TABS
10.0000 mg | ORAL_TABLET | Freq: Every day | ORAL | Status: DC
  Administered 2024-08-05: 10 mg via ORAL
  Filled 2024-08-05: qty 2

## 2024-08-05 NOTE — Progress Notes (Signed)
 Physical Therapy Treatment Patient Details Name: [name] MRN: [id number] DOB: 1961-02-21 Today's Date: 08/05/2024   History of Present Illness 63 y.o. female presents to [facility] hospital on 08/02/2024 with abdominal pain. Imaging with free air in abdomen. Pt underwent ex-lap with repair of perforated gastric ulcer on 8/4. PMH includes HTN, HLD, MS, OA.    PT Comments  Pt continues to make functional gains this admission.  She is fiercely independent at baseline and extremely motivated to return to prior level of function at this time.  She continues to require CGA overall.  With aggressive post acute rehab stay she will undoubtedbly return to independence which she is very motivated to due.  Largest limiting factor remains pain, strength and endurance.  Continue to recommend rehab in a post acute setting to maximize functional gains before returning home.     If plan is discharge home, recommend the following: Assistance with cooking/housework;Assist for transportation;Help with stairs or ramp for entrance;A little help with walking and/or transfers;A little help with bathing/dressing/bathroom   Can travel by private vehicle        Equipment Recommendations  Other (comment) (defer to next level of care)    Recommendations for Other Services Rehab consult     Precautions / Restrictions Precautions Precautions: Fall Recall of Precautions/Restrictions: Intact Precaution/Restrictions Comments: NG tube to suction, JP drain, wound vac. IV Restrictions Weight Bearing Restrictions Per Provider Order: No     Mobility  Bed Mobility Overal bed mobility: Needs Assistance Bed Mobility: Rolling, Sidelying to Sit Rolling: Supervision, Used rails Sidelying to sit: Supervision, HOB elevated, Used rails       General bed mobility comments: Pt able to recall log rolling technique (from previous back sx) without cuing. Pt requires increased time.  Heavy use of rails.    Transfers Overall  transfer level: Needs assistance Equipment used: Rolling walker (2 wheels) (would benefit from reg width RW and chair in room.) Transfers: Sit to/from Stand, Bed to chair/wheelchair/BSC Sit to Stand: Contact guard assist           General transfer comment: Cues for hand placement to and from seated surface.  Used rocking momentum and increased time to stand.  Audible crepitus noted in B knees.  Performed from bed and commode in bathroom.  remains in flexed stance in B knees.    Ambulation/Gait Ambulation/Gait assistance: Contact guard assist Gait Distance (Feet): 15 Feet (+ 10 ft) Assistive device: Rolling walker (2 wheels) Gait Pattern/deviations: Decreased step length - left, Decreased step length - right, Decreased stride length, Knee flexed in stance - right, Knee flexed in stance - left, Trunk flexed       General Gait Details: Pt ambulates with BLE hips/knees flexed throughout, report she's unable to fully extend them at baseline 2/2 BLE knee OA & need for TKAs.  Cues for RW safety and positiong in device.  Slow guarded gt noted.   Stairs             Wheelchair Mobility     Tilt Bed    Modified Rankin (Stroke Patients Only)       Balance Overall balance assessment: Needs assistance Sitting-balance support: Single extremity supported, Feet supported Sitting balance-Leahy Scale: Fair       Standing balance-Leahy Scale: Fair Standing balance comment: minA once gripping RW with BUE                            Communication  Communication Communication: No apparent difficulties  Cognition Arousal: Alert Behavior During Therapy: WFL for tasks assessed/performed   PT - Cognitive impairments: No apparent impairments                         Following commands: Intact      Cueing Cueing Techniques: Verbal cues  Exercises      General Comments        Pertinent Vitals/Pain Pain Assessment Pain Assessment: Faces Faces Pain Scale:  Hurts a little bit Pain Location: abdomen Pain Descriptors / Indicators: Discomfort, Sore Pain Intervention(s): Monitored during session, Repositioned    Home Living                          Prior Function            PT Goals (current goals can now be found in the care plan section) Acute Rehab PT Goals Patient Stated Goal: to return to prior level of function Potential to Achieve Goals: Good Progress towards PT goals: Progressing toward goals    Frequency    Min 3X/week      PT Plan      Co-evaluation              AM-PAC PT 6 Clicks Mobility   Outcome Measure  Help needed turning from your back to your side while in a flat bed without using bedrails?: A Little Help needed moving from lying on your back to sitting on the side of a flat bed without using bedrails?: A Little Help needed moving to and from a bed to a chair (including a wheelchair)?: A Little Help needed standing up from a chair using your arms (e.g., wheelchair or bedside chair)?: A Little Help needed to walk in hospital room?: A Little Help needed climbing 3-5 steps with a railing? : A Little 6 Click Score: 18    End of Session Equipment Utilized During Treatment: Gait belt Activity Tolerance: Patient tolerated treatment well;Patient limited by fatigue Patient left: in chair;with call bell/phone within reach;with family/visitor present Nurse Communication: Mobility status PT Visit Diagnosis: Other abnormalities of gait and mobility (R26.89);Muscle weakness (generalized) (M62.81);Pain Pain - part of body:  (abdomen)     Time: 8743-8672 PT Time Calculation (min) (ACUTE ONLY): 31 min  Charges:    $Gait Training: 8-22 mins $Therapeutic Activity: 8-22 mins PT General Charges $$ ACUTE PT VISIT: 1 Visit                     Toya HAMS , PTA Acute Rehabilitation Services Office [phone]    Nitzia Perren JINNY Gosling 08/05/2024, 1:40 PM

## 2024-08-05 NOTE — Plan of Care (Signed)

## 2024-08-05 NOTE — Progress Notes (Signed)
 3 Days Post-Op  Subjective: Patient feels well this morning. Hgb stable, WBC downtrending.  Objective: Vital signs in last 24 hours: Temp:  [98 F (36.7 C)-98.5 F (36.9 C)] 98.1 F (36.7 C) (08/07 0930) Pulse Rate:  [88-97] 94 (08/07 0930) Resp:  [16-18] 17 (08/07 0930) BP: (133-167)/(83-93) 149/92 (08/07 0930) SpO2:  [98 %-100 %] 98 % (08/07 0930) Last BM Date :  (PTA)  Intake/Output from previous day: 08/06 0701 - 08/07 0700 In: 0  Out: 265 [Urine:200; Drains:65] Intake/Output this shift: No intake/output data recorded.  PE: Gen:  Alert, NAD, pleasant HEENT: NG nonbilious Pulm:  Nonlabored respirations on room air Abd: Soft, nondistended, VAC to midline wound with serosanguinous drainage. JP serosanguinous. Ext: Trace non-pitting edema bilaterally  Psych: A&Ox3    Lab Results:  Recent Labs    08/04/24 0953 08/05/24 0310  WBC 20.6* 16.4*  HGB 8.4* 8.5*  HCT 27.0* 26.6*  PLT 239 256   BMET Recent Labs    08/04/24 0953 08/05/24 0310  NA 139 138  K 3.4* 3.5  CL 102 102  CO2 26 24  GLUCOSE 89 92  BUN 9 8  CREATININE 0.76 0.73  CALCIUM  8.7* 8.7*   PT/INR No results for input(s): LABPROT, INR in the last 72 hours. CMP     Component Value Date/Time   NA 138 08/05/2024 0310   NA 140 01/17/2020 1326   K 3.5 08/05/2024 0310   CL 102 08/05/2024 0310   CO2 24 08/05/2024 0310   GLUCOSE 92 08/05/2024 0310   BUN 8 08/05/2024 0310   BUN 19 01/17/2020 1326   CREATININE 0.73 08/05/2024 0310   CALCIUM  8.7 (L) 08/05/2024 0310   PROT 6.9 08/02/2024 0532   PROT 6.3 01/17/2020 1326   ALBUMIN  3.2 (L) 08/02/2024 0532   ALBUMIN  4.1 01/17/2020 1326   AST 18 08/02/2024 0532   ALT 15 08/02/2024 0532   ALKPHOS 82 08/02/2024 0532   BILITOT 0.5 08/02/2024 0532   BILITOT 0.4 01/17/2020 1326   GFRNONAA >60 08/05/2024 0310   GFRAA 104 01/17/2020 1326   Lipase     Component Value Date/Time   LIPASE 24 08/02/2024 0532    Studies/Results: No results  found.   Anti-infectives: Anti-infectives (From admission, onward)    Start     Dose/Rate Route Frequency Ordered Stop   08/02/24 1630  fluconazole  (DIFLUCAN ) IVPB 200 mg        200 mg 100 mL/hr over 60 Minutes Intravenous Every 24 hours 08/02/24 1541 08/07/24 1629   08/02/24 1600  piperacillin -tazobactam (ZOSYN ) IVPB 3.375 g        3.375 g 12.5 mL/hr over 240 Minutes Intravenous Every 8 hours 08/02/24 1541 08/07/24 1359   08/02/24 0615  piperacillin -tazobactam (ZOSYN ) IVPB 3.375 g        3.375 g 100 mL/hr over 30 Minutes Intravenous  Once 08/02/24 0604 08/02/24 0713        Assessment/Plan POD 3 s/p Exploratory laparotomy, Arlyss patch repair of perforated gastric ulcer by Dr. Dasie on 08/02/24 - Path with Inflammatory exudate consistent with origin from an ulcer. No mucosal tissue present.  - Upper GI today. If no leak at site of Crooks patch, remove NG tube and advance to clear liquid diet - Cont Zosyn /Fluconazole . Plan 5d post op - Keep JP drain until tolerating solid food - BID PPI - Awaiting H pylori stool antigen - Multimodal pain control. Avoid NSAIDs - Mobilize, PT/OT - currently recommending CIR - Pulm toilet, wean  o2 as able - Wound care: wound vac to midline wound, change M/Th - Resume home medications for HTN, HLD and MS after NG removed  FEN - NPO, NGT to LIWS, BID PPI, LR at 40ml/hr  VTE - SCDs, start Lovenox  tonight if hgb stable ID - Zosyn /Fluconazole  for 5 days postop    LOS: 3 days    Leonor LITTIE Dawn, MD [facility] 08/05/2024, 9:44 AM Please see Amion for pager number during day hours 7:00am-4:30pm

## 2024-08-06 ENCOUNTER — Inpatient Hospital Stay (HOSPITAL_COMMUNITY)

## 2024-08-06 MED ORDER — IOHEXOL 9 MG/ML PO SOLN
50.0000 mL | ORAL | Status: AC
  Administered 2024-08-06: 50 mL via ORAL

## 2024-08-06 MED ORDER — IOHEXOL 350 MG/ML SOLN
75.0000 mL | Freq: Once | INTRAVENOUS | Status: AC | PRN
  Administered 2024-08-06: 75 mL via INTRAVENOUS

## 2024-08-06 MED ORDER — ACETAMINOPHEN 10 MG/ML IV SOLN
1000.0000 mg | Freq: Three times a day (TID) | INTRAVENOUS | Status: AC
  Administered 2024-08-06 – 2024-08-07 (×3): 1000 mg via INTRAVENOUS
  Filled 2024-08-06 (×3): qty 100

## 2024-08-06 NOTE — Progress Notes (Signed)
 Diflucan  infusion not available. Pharmacy has been notified to send medication.

## 2024-08-06 NOTE — Progress Notes (Signed)
 Physical Therapy Treatment Patient Details Name: [name] MRN: [id number] DOB: November 13, 1961 Today's Date: 08/06/2024   History of Present Illness 63 y.o. female presents to [facility] hospital on 08/02/2024 with abdominal pain. Imaging with free air in abdomen. Pt underwent ex-lap with repair of perforated gastric ulcer on 8/4. PMH includes HTN, HLD, MS, OA.    PT Comments  Pt requesting assistance with mobilizing off of BSC and back to bed upon PT arrival. Pt stating that she has a scan later today and would sit up in the recliner chair after she returned from the scan. Pt required CGA for transfers and bed mobility, with total A needed for pericare after using BSC. Pt's daughter present throughout session, very attentive to pt's needs. Pt would continue to benefit from skilled physical therapy services at this time while admitted and after d/c to address the below listed limitations in order to improve overall safety and independence with functional mobility.     If plan is discharge home, recommend the following: Assistance with cooking/housework;Assist for transportation;Help with stairs or ramp for entrance;A little help with walking and/or transfers;A little help with bathing/dressing/bathroom   Can travel by private vehicle        Equipment Recommendations  Other (comment) (defer to next venue of care)    Recommendations for Other Services       Precautions / Restrictions Precautions Precautions: Fall Recall of Precautions/Restrictions: Intact Precaution/Restrictions Comments: NG tube to suction, JP drain, wound vac. IV Restrictions Weight Bearing Restrictions Per Provider Order: No     Mobility  Bed Mobility Overal bed mobility: Needs Assistance Bed Mobility: Sit to Supine       Sit to supine: Contact guard assist   General bed mobility comments: pt very particular and specific about how to get back into bed; did not utilize log roll technique, did not want assistance with  bilateral LEs; PT managed lines/drains/tubes    Transfers Overall transfer level: Needs assistance Equipment used: Rolling walker (2 wheels) Transfers: Sit to/from Stand, Bed to chair/wheelchair/BSC Sit to Stand: Contact guard assist   Step pivot transfers: Contact guard assist       General transfer comment: pt using good technique with RW, steady with transitional movements and with pivot steps to bed from [facility]    Ambulation/Gait               General Gait Details: pt declining further mobility or OOB in chair at this time, requesting to return to bed as she had a scan later today. pt reporting that she would sit up in the chair after her scan   Stairs             Wheelchair Mobility     Tilt Bed    Modified Rankin (Stroke Patients Only)       Balance Overall balance assessment: Needs assistance Sitting-balance support: Feet supported Sitting balance-Leahy Scale: Fair     Standing balance support: Bilateral upper extremity supported, Reliant on assistive device for balance Standing balance-Leahy Scale: Poor                              Communication Communication Communication: No apparent difficulties  Cognition Arousal: Alert Behavior During Therapy: WFL for tasks assessed/performed   PT - Cognitive impairments: No apparent impairments                         Following  commands: Intact      Cueing Cueing Techniques: Verbal cues  Exercises      General Comments        Pertinent Vitals/Pain Pain Assessment Pain Assessment: 0-10 Pain Score: 10-Worst pain ever (it's a 25!) Pain Location: abdomen Pain Descriptors / Indicators: Discomfort, Sore Pain Intervention(s): Monitored during session    Home Living                          Prior Function            PT Goals (current goals can now be found in the care plan section) Acute Rehab PT Goals PT Goal Formulation: With patient Time For Goal  Achievement: 08/17/24 Potential to Achieve Goals: Good Progress towards PT goals: Progressing toward goals    Frequency    Min 3X/week      PT Plan      Co-evaluation              AM-PAC PT 6 Clicks Mobility   Outcome Measure  Help needed turning from your back to your side while in a flat bed without using bedrails?: A Little Help needed moving from lying on your back to sitting on the side of a flat bed without using bedrails?: A Little Help needed moving to and from a bed to a chair (including a wheelchair)?: A Little Help needed standing up from a chair using your arms (e.g., wheelchair or bedside chair)?: A Little Help needed to walk in hospital room?: A Little Help needed climbing 3-5 steps with a railing? : A Little 6 Click Score: 18    End of Session   Activity Tolerance: Patient tolerated treatment well;Patient limited by fatigue Patient left: in bed;with call bell/phone within reach;with family/visitor present Nurse Communication: Mobility status PT Visit Diagnosis: Other abnormalities of gait and mobility (R26.89);Muscle weakness (generalized) (M62.81);Pain Pain - part of body:  (abdomen)     Time: 9185-9175 PT Time Calculation (min) (ACUTE ONLY): 10 min  Charges:    $Therapeutic Activity: 8-22 mins PT General Charges $$ ACUTE PT VISIT: 1 Visit                     Delon DELENA KLEIN, DPT  Acute Rehabilitation Services Office [phone]    Delon HERO Yue Glasheen 08/06/2024, 9:46 AM

## 2024-08-06 NOTE — Progress Notes (Signed)
 OT Cancellation Note  Patient Details Name: [name] MRN: [id number] DOB: Aug 23, 1961   Cancelled Treatment:    Reason Eval/Treat Not Completed: Other (comment).  Attempted skilled OT treatment session.  Pt. Politely declined as she is waiting for iv issues to be resolved and pending procedure.  Reviewed will check back over the weekend as able.    CHRISTELLA Nest Lorraine-COTA/L  08/06/2024, 3:40 PM

## 2024-08-06 NOTE — Plan of Care (Signed)
  Problem: Activity: Goal: Risk for activity intolerance will decrease Outcome: Progressing   Problem: Coping: Goal: Level of anxiety will decrease Outcome: Progressing   Problem: Pain Managment: Goal: General experience of comfort will improve and/or be controlled Outcome: Progressing   Problem: Safety: Goal: Ability to remain free from injury will improve Outcome: Progressing

## 2024-08-06 NOTE — Progress Notes (Signed)
 Inpatient Rehab Admissions Coordinator:   Note pending CT today to eval for abscess.  +/- d/c NG tube pending results.  I will f/u on Monday.   Reche Lowers, PT, DPT Admissions Coordinator [phone] 08/06/24  12:29 PM

## 2024-08-06 NOTE — Progress Notes (Signed)
 Pt transported to CT ?

## 2024-08-06 NOTE — Consult Note (Signed)
 WOC Nurse wound follow up Wound type: surgical Measurement: 14 cm x 6 x cm 2 cm  Wound bed: pink, moist with adipose tissue scattered throughout Drainage (amount, consistency, odor) serosanguinous in cannister Periwound: intact Dressing procedure/placement/frequency: Removed old NPWT dressing ( 1 piece black)  Filled wound with   _1___ piece of black foam, barrier ring utilized at the inferior aspect of wound to achieve seal  Sealed NPWT dressing at HG  Patient tolerated procedure well  WOC nurse will continue to provide NPWT dressing changed due to the complexity of the dressing change.    Thank you,  Doyal Polite, RN, MSN, [facility] WOC Team

## 2024-08-06 NOTE — Progress Notes (Addendum)
 4 Days Post-Op  Subjective: No acute changes. UGI yesterday concerning for a possible small contained leak.   Objective: Vital signs in last 24 hours: Temp:  [97.5 F (36.4 C)-98.7 F (37.1 C)] 98.7 F (37.1 C) (08/08 0854) Pulse Rate:  [81-94] 81 (08/08 0854) Resp:  [17-18] 17 (08/08 0854) BP: (139-169)/(91-98) 154/92 (08/08 0854) SpO2:  [94 %-100 %] 99 % (08/08 0854) Last BM Date :  (PTA)  Intake/Output from previous day: 08/07 0701 - 08/08 0700 In: 0  Out: 135 [Emesis/NG output:100; Drains:35] Intake/Output this shift: No intake/output data recorded.  PE: Gen:  Alert, NAD, pleasant HEENT: NG nonbilious Pulm:  Nonlabored respirations on room air Abd: Soft, nondistended, VAC to midline wound with serosanguinous drainage. JP serosanguinous. Psych: A&Ox3    Lab Results:  Recent Labs    08/04/24 0953 08/05/24 0310  WBC 20.6* 16.4*  HGB 8.4* 8.5*  HCT 27.0* 26.6*  PLT 239 256   BMET Recent Labs    08/04/24 0953 08/05/24 0310  NA 139 138  K 3.4* 3.5  CL 102 102  CO2 26 24  GLUCOSE 89 92  BUN 9 8  CREATININE 0.76 0.73  CALCIUM  8.7* 8.7*   PT/INR No results for input(s): LABPROT, INR in the last 72 hours. CMP     Component Value Date/Time   NA 138 08/05/2024 0310   NA 140 01/17/2020 1326   K 3.5 08/05/2024 0310   CL 102 08/05/2024 0310   CO2 24 08/05/2024 0310   GLUCOSE 92 08/05/2024 0310   BUN 8 08/05/2024 0310   BUN 19 01/17/2020 1326   CREATININE 0.73 08/05/2024 0310   CALCIUM  8.7 (L) 08/05/2024 0310   PROT 6.9 08/02/2024 0532   PROT 6.3 01/17/2020 1326   ALBUMIN  3.2 (L) 08/02/2024 0532   ALBUMIN  4.1 01/17/2020 1326   AST 18 08/02/2024 0532   ALT 15 08/02/2024 0532   ALKPHOS 82 08/02/2024 0532   BILITOT 0.5 08/02/2024 0532   BILITOT 0.4 01/17/2020 1326   GFRNONAA >60 08/05/2024 0310   GFRAA 104 01/17/2020 1326   Lipase     Component Value Date/Time   LIPASE 24 08/02/2024 0532    Studies/Results: DG UGI W SINGLE CM (SOL OR  THIN BA) Result Date: 08/05/2024 CLINICAL DATA:  63 year old female female with a recent history of perforated gastric ulcer, status post Graham patch on 8/4. IR was requested for water-soluble single-contrast upper GI imaging to rule out possible contrast extravasation. EXAM: DG UGI W SINGLE CM TECHNIQUE: Single contrast examination was then performed using thin liquid barium. This exam was performed by Carlin Griffon, PA-C, and was supervised and interpreted by Dr. Ree Molt MD. FLUOROSCOPY: Radiation Exposure Index (as provided by the fluoroscopic device): 44.3 mGy Kerma COMPARISON:  CT Angio Chest/Abdomen/Pelvis for dissection from 08/02/2024. FINDINGS: Esophagus:  Not performed. Patient has NGT in place. Esophageal motility:  Not performed.  Patient has NGT in place. Gastroesophageal reflux:  Not performed. Ingested 13mm barium tablet: Not given Stomach: There is opacification of stomach with contrast injected through the nasogastric tube. There is a triangular approximately 9 x 19 mm outpouching along the superior aspect of the pylorus of the stomach (measured on image 3). The pouch is outside the normal contour of the distal stomach/duodenum. However, it appears to empty by itself on later image. Findings may represent probable small contained perforation. Correlate with surgical history to exclude surgical intervention/postsurgical appearance, which is considered less likely. In case of discordant clinical findings, attempt  can be made to confirm the finding with CT scan abdomen immediately after giving positive oral contrast. Gastric emptying: Normal. Duodenum:  Normal appearance. Other: Study was limited by patient's postoperative pain, precluding adequate positioning for imaging. IMPRESSION: *There is a triangular approximately 9 x 19 mm outpouching along the superior aspect of the pylorus of the stomach, which is outside the normal contour of the distal stomach/duodenum. However, it appears to empty by  itself on later images. Findings favor probable small contained perforation. Correlate clinically. Electronically Signed   By: Ree Molt M.D.   On: 08/05/2024 12:21     Anti-infectives: Anti-infectives (From admission, onward)    Start     Dose/Rate Route Frequency Ordered Stop   08/02/24 1630  fluconazole  (DIFLUCAN ) IVPB 200 mg        200 mg 100 mL/hr over 60 Minutes Intravenous Every 24 hours 08/02/24 1541 08/07/24 1629   08/02/24 1600  piperacillin -tazobactam (ZOSYN ) IVPB 3.375 g        3.375 g 12.5 mL/hr over 240 Minutes Intravenous Every 8 hours 08/02/24 1541 08/07/24 1359   08/02/24 0615  piperacillin -tazobactam (ZOSYN ) IVPB 3.375 g        3.375 g 100 mL/hr over 30 Minutes Intravenous  Once 08/02/24 0604 08/02/24 0713        Assessment/Plan POD 4 s/p Exploratory laparotomy, Arlyss patch repair of perforated gastric ulcer by Dr. Dasie on 08/02/24 - Path with Inflammatory exudate consistent with origin from an ulcer. No mucosal tissue present.  - Proceed with CT abd/pelvis today with oral contrast to further evaluate for leak. If negative, remove NG. If there is concern for leak, continue NG decompression.  - Cont Zosyn /Fluconazole  until POD5. - Keep JP drain until tolerating solid food - BID PPI - H pylori stool antigen pending - Multimodal pain control. Avoid NSAIDs - Mobilize, PT/OT - currently recommending CIR - Pulm toilet - Wound care: wound vac to midline wound, change M/Th - Resume home medications for HTN, HLD and MS after NG removed  FEN - NPO, NGT to LIWS, BID PPI, LR at 75ml/hr  VTE - SCDs, Lovenox  ID - Zosyn /Fluconazole  for 5 days postop    LOS: 4 days    Leonor LITTIE Dasie, MD [facility] Surgery 08/06/2024, 9:20 AM Please see Amion for pager number during day hours 7:00am-4:30pm

## 2024-08-06 NOTE — Progress Notes (Signed)
 Pt had BM, sent specimen for hpylori

## 2024-08-06 NOTE — Progress Notes (Signed)
 IVT consulted for PIV placement for CT scan. Primary RN's and CT tech notified midline is appropriate for needed scan.

## 2024-08-07 LAB — CBC
HCT: 25.9 % — ABNORMAL LOW (ref 36.0–46.0)
Hemoglobin: 8.3 g/dL — ABNORMAL LOW (ref 12.0–15.0)
MCH: 28.4 pg (ref 26.0–34.0)
MCHC: 32 g/dL (ref 30.0–36.0)
MCV: 88.7 fL (ref 80.0–100.0)
Platelets: 304 K/uL (ref 150–400)
RBC: 2.92 MIL/uL — ABNORMAL LOW (ref 3.87–5.11)
RDW: 13.2 % (ref 11.5–15.5)
WBC: 8.6 K/uL (ref 4.0–10.5)
nRBC: 0 % (ref 0.0–0.2)

## 2024-08-07 NOTE — Plan of Care (Signed)

## 2024-08-07 NOTE — Progress Notes (Signed)
 5 Days Post-Op   Subjective/Chief Complaint: No complaints. Passing flatus. CT showed no evidence of leak   Objective: Vital signs in last 24 hours: Temp:  [98.3 F (36.8 C)] 98.3 F (36.8 C) (08/09 0421) Pulse Rate:  [91] 91 (08/09 0421) Resp:  [16] 16 (08/09 0421) BP: (164)/(94) 164/94 (08/09 0421) SpO2:  [98 %] 98 % (08/09 0421) Last BM Date : 08/06/24  Intake/Output from previous day: 08/08 0701 - 08/09 0700 In: -  Out: 6 [Drains:5; Stool:1] Intake/Output this shift: No intake/output data recorded.  General appearance: alert and cooperative Resp: clear to auscultation bilaterally Cardio: regular rate and rhythm GI: soft, minimal tenderness. Good bs. Drain output serosanguinous  Lab Results:  Recent Labs    08/04/24 0953 08/05/24 0310  WBC 20.6* 16.4*  HGB 8.4* 8.5*  HCT 27.0* 26.6*  PLT 239 256   BMET Recent Labs    08/04/24 0953 08/05/24 0310  NA 139 138  K 3.4* 3.5  CL 102 102  CO2 26 24  GLUCOSE 89 92  BUN 9 8  CREATININE 0.76 0.73  CALCIUM  8.7* 8.7*   PT/INR No results for input(s): LABPROT, INR in the last 72 hours. ABG No results for input(s): PHART, HCO3 in the last 72 hours.  Invalid input(s): PCO2, PO2  Studies/Results: CT ABDOMEN PELVIS W CONTRAST Result Date: 08/06/2024 CLINICAL DATA:  Evaluate for gastric leak EXAM: CT ABDOMEN AND PELVIS WITH CONTRAST TECHNIQUE: Multidetector CT imaging of the abdomen and pelvis was performed using the standard protocol following bolus administration of intravenous contrast. RADIATION DOSE REDUCTION: This exam was performed according to the departmental dose-optimization program which includes automated exposure control, adjustment of the mA and/or kV according to patient size and/or use of iterative reconstruction technique. CONTRAST:  75mL OMNIPAQUE  IOHEXOL  350 MG/ML SOLN COMPARISON:  CT angio chest abdomen pelvis August 02 2024 FINDINGS: Lower chest: No acute abnormality. Hepatobiliary: No  focal liver abnormality is seen. No gallstones, gallbladder wall thickening, or biliary dilatation. Pancreas: Unremarkable. No pancreatic ductal dilatation or surrounding inflammatory changes. Spleen: Normal in size without focal abnormality. Adrenals/Urinary Tract: Adrenal glands are unremarkable. Cortical lesion measuring 1.5 cm likely a cyst. Hypoattenuating left kidney interpolar lesion likely a cyst which does not require imaging follow-up posterior spinal fusion. No hydronephrosis. Trace perinephric fluid along the right kidney lateral cortex. Bladder is under distended otherwise unremarkable Stomach/Bowel: Percutaneous closed system drain is identified tip of the catheter in gastrohepatic region anterior to the gastric cardia there is mild perigastric fat stranding however no definite contrast leak identified. Enteric tube identified gastric lumen tip in the gastric body. Small hiatal hernia. Gastric wall is edematous and hypodense. No outlet obstruction. The appendix is not visualized. No evidence of bowel wall thickening, distention, or inflammatory changes. Vascular/Lymphatic: Atherosclerotic calcifications of aorta. No enlarged abdominal or pelvic lymph nodes. Reproductive: Uterus and bilateral adnexa are unremarkable. Other: Anterior abdominal wall defect measuring 9 mm about 5.4 cm above the umbilicus. No abdominal wall hernia or abnormality. No abdominopelvic ascites. Musculoskeletal: Posterior spinal fusion device and disc spacer L4-5. Degenerative changes of the spine. o acute or significant osseous findings. IMPRESSION: No suspicious finding to suggest gastric leak or definite free fluid/air. Mild perigastric fat stranding likely inflammatory due to postsurgical changes. Percutaneous drain system terminates anterior to the gastric cardia. Moderate size hiatal hernia. Electronically Signed   By: Megan  Zare M.D.   On: 08/06/2024 18:31   DG UGI W SINGLE CM (SOL OR THIN BA) Result Date:  08/05/2024  CLINICAL DATA:  63 year old female female with a recent history of perforated gastric ulcer, status post Graham patch on 8/4. IR was requested for water-soluble single-contrast upper GI imaging to rule out possible contrast extravasation. EXAM: DG UGI W SINGLE CM TECHNIQUE: Single contrast examination was then performed using thin liquid barium. This exam was performed by Carlin Griffon, PA-C, and was supervised and interpreted by Dr. Ree Molt MD. FLUOROSCOPY: Radiation Exposure Index (as provided by the fluoroscopic device): 44.3 mGy Kerma COMPARISON:  CT Angio Chest/Abdomen/Pelvis for dissection from 08/02/2024. FINDINGS: Esophagus:  Not performed. Patient has NGT in place. Esophageal motility:  Not performed.  Patient has NGT in place. Gastroesophageal reflux:  Not performed. Ingested 13mm barium tablet: Not given Stomach: There is opacification of stomach with contrast injected through the nasogastric tube. There is a triangular approximately 9 x 19 mm outpouching along the superior aspect of the pylorus of the stomach (measured on image 3). The pouch is outside the normal contour of the distal stomach/duodenum. However, it appears to empty by itself on later image. Findings may represent probable small contained perforation. Correlate with surgical history to exclude surgical intervention/postsurgical appearance, which is considered less likely. In case of discordant clinical findings, attempt can be made to confirm the finding with CT scan abdomen immediately after giving positive oral contrast. Gastric emptying: Normal. Duodenum:  Normal appearance. Other: Study was limited by patient's postoperative pain, precluding adequate positioning for imaging. IMPRESSION: *There is a triangular approximately 9 x 19 mm outpouching along the superior aspect of the pylorus of the stomach, which is outside the normal contour of the distal stomach/duodenum. However, it appears to empty by itself on later images.  Findings favor probable small contained perforation. Correlate clinically. Electronically Signed   By: Ree Molt M.D.   On: 08/05/2024 12:21    Anti-infectives: Anti-infectives (From admission, onward)    Start     Dose/Rate Route Frequency Ordered Stop   08/02/24 1630  fluconazole  (DIFLUCAN ) IVPB 200 mg        200 mg 100 mL/hr over 60 Minutes Intravenous Every 24 hours 08/02/24 1541 08/07/24 1629   08/02/24 1600  piperacillin -tazobactam (ZOSYN ) IVPB 3.375 g        3.375 g 12.5 mL/hr over 240 Minutes Intravenous Every 8 hours 08/02/24 1541 08/07/24 1359   08/02/24 0615  piperacillin -tazobactam (ZOSYN ) IVPB 3.375 g        3.375 g 100 mL/hr over 30 Minutes Intravenous  Once 08/02/24 0604 08/02/24 0713       Assessment/Plan: s/p Procedure(s): LAPAROTOMY, EXPLORATORY (N/A) GRAM PATCH REPAIR (N/A) Advance diet. Allow clears today D/c ng Continue abx Recheck hg and wbc PPI Continue drain  LOS: 5 days    [name] 08/07/2024

## 2024-08-08 LAB — H. PYLORI ANTIGEN, STOOL: H. Pylori Stool Ag, Eia: NEGATIVE

## 2024-08-08 NOTE — Plan of Care (Signed)

## 2024-08-08 NOTE — Plan of Care (Signed)
   Problem: Education: Goal: Knowledge of General Education information will improve Description: Including pain rating scale, medication(s)/side effects and non-pharmacologic comfort measures Outcome: Progressing   Problem: Activity: Goal: Risk for activity intolerance will decrease Outcome: Progressing   Problem: Coping: Goal: Level of anxiety will decrease Outcome: Progressing

## 2024-08-08 NOTE — Progress Notes (Signed)
 6 Days Post-Op   Subjective/Chief Complaint: Reports she had a couple accidents in the bed. Scared to take much po   Objective: Vital signs in last 24 hours: Temp:  [98 F (36.7 C)-98.9 F (37.2 C)] 98.6 F (37 C) (08/10 0814) Pulse Rate:  [84-94] 92 (08/10 0814) Resp:  [16-17] 16 (08/10 0814) BP: (149-168)/(89-91) 164/89 (08/10 0814) SpO2:  [96 %-100 %] 98 % (08/10 0814) Last BM Date : 08/06/24  Intake/Output from previous day: 08/09 0701 - 08/10 0700 In: 4817 [P.O.:60; I.V.:4757] Out: 420 [Urine:300; Drains:120] Intake/Output this shift: No intake/output data recorded.  General appearance: alert and cooperative Resp: clear to auscultation bilaterally Cardio: regular rate and rhythm GI: soft, minimal tenderness. Drain output serous  Lab Results:  Recent Labs    08/07/24 1155  WBC 8.6  HGB 8.3*  HCT 25.9*  PLT 304   BMET No results for input(s): NA, K, CL, CO2, GLUCOSE, BUN, CREATININE, CALCIUM  in the last 72 hours. PT/INR No results for input(s): LABPROT, INR in the last 72 hours. ABG No results for input(s): PHART, HCO3 in the last 72 hours.  Invalid input(s): PCO2, PO2  Studies/Results: CT ABDOMEN PELVIS W CONTRAST Result Date: 08/06/2024 CLINICAL DATA:  Evaluate for gastric leak EXAM: CT ABDOMEN AND PELVIS WITH CONTRAST TECHNIQUE: Multidetector CT imaging of the abdomen and pelvis was performed using the standard protocol following bolus administration of intravenous contrast. RADIATION DOSE REDUCTION: This exam was performed according to the departmental dose-optimization program which includes automated exposure control, adjustment of the mA and/or kV according to patient size and/or use of iterative reconstruction technique. CONTRAST:  75mL OMNIPAQUE  IOHEXOL  350 MG/ML SOLN COMPARISON:  CT angio chest abdomen pelvis August 02 2024 FINDINGS: Lower chest: No acute abnormality. Hepatobiliary: No focal liver abnormality is seen. No  gallstones, gallbladder wall thickening, or biliary dilatation. Pancreas: Unremarkable. No pancreatic ductal dilatation or surrounding inflammatory changes. Spleen: Normal in size without focal abnormality. Adrenals/Urinary Tract: Adrenal glands are unremarkable. Cortical lesion measuring 1.5 cm likely a cyst. Hypoattenuating left kidney interpolar lesion likely a cyst which does not require imaging follow-up posterior spinal fusion. No hydronephrosis. Trace perinephric fluid along the right kidney lateral cortex. Bladder is under distended otherwise unremarkable Stomach/Bowel: Percutaneous closed system drain is identified tip of the catheter in gastrohepatic region anterior to the gastric cardia there is mild perigastric fat stranding however no definite contrast leak identified. Enteric tube identified gastric lumen tip in the gastric body. Small hiatal hernia. Gastric wall is edematous and hypodense. No outlet obstruction. The appendix is not visualized. No evidence of bowel wall thickening, distention, or inflammatory changes. Vascular/Lymphatic: Atherosclerotic calcifications of aorta. No enlarged abdominal or pelvic lymph nodes. Reproductive: Uterus and bilateral adnexa are unremarkable. Other: Anterior abdominal wall defect measuring 9 mm about 5.4 cm above the umbilicus. No abdominal wall hernia or abnormality. No abdominopelvic ascites. Musculoskeletal: Posterior spinal fusion device and disc spacer L4-5. Degenerative changes of the spine. o acute or significant osseous findings. IMPRESSION: No suspicious finding to suggest gastric leak or definite free fluid/air. Mild perigastric fat stranding likely inflammatory due to postsurgical changes. Percutaneous drain system terminates anterior to the gastric cardia. Moderate size hiatal hernia. Electronically Signed   By: Megan  Zare M.D.   On: 08/06/2024 18:31    Anti-infectives: Anti-infectives (From admission, onward)    Start     Dose/Rate Route  Frequency Ordered Stop   08/02/24 1630  fluconazole  (DIFLUCAN ) IVPB 200 mg        200  mg 100 mL/hr over 60 Minutes Intravenous Every 24 hours 08/02/24 1541 08/07/24 1629   08/02/24 1600  piperacillin -tazobactam (ZOSYN ) IVPB 3.375 g        3.375 g 12.5 mL/hr over 240 Minutes Intravenous Every 8 hours 08/02/24 1541 08/07/24 1002   08/02/24 0615  piperacillin -tazobactam (ZOSYN ) IVPB 3.375 g        3.375 g 100 mL/hr over 30 Minutes Intravenous  Once 08/02/24 0604 08/02/24 0713       Assessment/Plan: s/p Procedure(s): LAPAROTOMY, EXPLORATORY (N/A) GRAM PATCH REPAIR (N/A) Continue with clears No sign of leak Ambulate PPI Continue drain  LOS: 6 days    Deward Null III 08/08/2024

## 2024-08-09 LAB — CREATININE, SERUM
Creatinine, Ser: 0.79 mg/dL (ref 0.44–1.00)
GFR, Estimated: >60 mL/min (ref >60)

## 2024-08-09 MED ORDER — ROSUVASTATIN CALCIUM 5 MG PO TABS
10.0000 mg | ORAL_TABLET | Freq: Every day | ORAL | Status: DC
  Administered 2024-08-09 – 2024-08-16 (×11): 10 mg via ORAL
  Filled 2024-08-09 (×8): qty 2

## 2024-08-09 MED ORDER — TERIFLUNOMIDE 14 MG PO TABS
14.0000 mg | ORAL_TABLET | Freq: Every day | ORAL | Status: DC
  Administered 2024-08-11 – 2024-08-16 (×7): 14 mg via ORAL
  Filled 2024-08-09 (×12): qty 1

## 2024-08-09 MED ORDER — PNEUMOCOCCAL 20-VAL CONJ VACC 0.5 ML IM SUSY
0.5000 mL | PREFILLED_SYRINGE | INTRAMUSCULAR | Status: DC
  Filled 2024-08-09: qty 0.5

## 2024-08-09 MED ORDER — ENSURE PLUS HIGH PROTEIN PO LIQD
237.0000 mL | Freq: Two times a day (BID) | ORAL | Status: DC
  Administered 2024-08-09 – 2024-08-15 (×8): 237 mL via ORAL

## 2024-08-09 MED ORDER — HYDROCHLOROTHIAZIDE 25 MG PO TABS
25.0000 mg | ORAL_TABLET | Freq: Every morning | ORAL | Status: DC
  Administered 2024-08-09 – 2024-08-17 (×12): 25 mg via ORAL
  Filled 2024-08-09 (×9): qty 1

## 2024-08-09 MED ORDER — METHOCARBAMOL 500 MG PO TABS
500.0000 mg | ORAL_TABLET | Freq: Three times a day (TID) | ORAL | Status: DC
  Administered 2024-08-09 – 2024-08-17 (×32): 500 mg via ORAL
  Filled 2024-08-09 (×25): qty 1

## 2024-08-09 MED ORDER — ACETAMINOPHEN 500 MG PO TABS
1000.0000 mg | ORAL_TABLET | Freq: Four times a day (QID) | ORAL | Status: DC
  Administered 2024-08-09 – 2024-08-17 (×40): 1000 mg via ORAL
  Filled 2024-08-09 (×34): qty 2

## 2024-08-09 NOTE — Plan of Care (Signed)
   Problem: Education: Goal: Knowledge of General Education information will improve Description Including pain rating scale, medication(s)/side effects and non-pharmacologic comfort measures Outcome: Progressing

## 2024-08-09 NOTE — Progress Notes (Signed)
 Physical Therapy Treatment Patient Details Name: [name] MRN: [id number] DOB: 04-Dec-1961 Today's Date: 08/09/2024   History of Present Illness 63 y.o. female presents to [facility] hospital on 08/02/2024 with abdominal pain. Imaging with free air in abdomen. Pt underwent ex-lap with repair of perforated gastric ulcer on 8/4. PMH includes HTN, HLD, MS, OA.    PT Comments  Pt resting in bed on arrival and agreeable to session with continued progress towards acute goals. Pt continues to be limited in safe mobility by decreased activity tolerance and LE pain, limiting standing and weight bearing tolerance. Pt requiring up to min A to boost to stand due to flexed posture with increased time to come to upright. Pt able to progress gait x3 bouts of increasing distance with RW for support, CGA and chair follow for safety as pt quick to fatigue with audible crepitus in bil knees. Pt daughter present and supportive throughout session. Patient will benefit from intensive inpatient follow-up therapy, >3 hours/day and continues to benefit from skilled PT services to progress toward functional mobility goals.      If plan is discharge home, recommend the following: Assistance with cooking/housework;Assist for transportation;Help with stairs or ramp for entrance;A little help with walking and/or transfers;A little help with bathing/dressing/bathroom   Can travel by private vehicle        Equipment Recommendations  Other (comment)    Recommendations for Other Services       Precautions / Restrictions Precautions Precautions: Fall Recall of Precautions/Restrictions: Intact Precaution/Restrictions Comments: JP drain, wound vac. Restrictions Weight Bearing Restrictions Per Provider Order: No     Mobility  Bed Mobility Overal bed mobility: Needs Assistance Bed Mobility: Sidelying to Sit, Rolling Rolling: Supervision Sidelying to sit: Supervision, HOB elevated, Used rails       General bed  mobility comments: increased time but no assist needed    Transfers Overall transfer level: Needs assistance Equipment used: Rolling walker (2 wheels) Transfers: Sit to/from Stand, Bed to chair/wheelchair/BSC Sit to Stand: Min assist           General transfer comment: up to min A  from slightly elevate EOB due to decreased balance and flexed posture, increased time needed to come to upright standing    Ambulation/Gait Ambulation/Gait assistance: Contact guard assist, +2 safety/equipment (chair follow) Gait Distance (Feet): 15 Feet (+ 25' x2 with seated rest breaks between bouts) Assistive device: Rolling walker (2 wheels) Gait Pattern/deviations: Decreased step length - left, Decreased step length - right, Decreased stride length, Knee flexed in stance - right, Knee flexed in stance - left, Trunk flexed Gait velocity: decr     General Gait Details: slow effortful steps with audible crepitus from bil kness, knees and trunk flexed throughout   Stairs             Wheelchair Mobility     Tilt Bed    Modified Rankin (Stroke Patients Only)       Balance Overall balance assessment: Needs assistance Sitting-balance support: Feet supported Sitting balance-Leahy Scale: Fair     Standing balance support: Bilateral upper extremity supported, Reliant on assistive device for balance Standing balance-Leahy Scale: Poor Standing balance comment: heavy reliance on UE support                            Communication Communication Communication: No apparent difficulties  Cognition Arousal: Alert Behavior During Therapy: WFL for tasks assessed/performed  Following commands: Intact      Cueing Cueing Techniques: Verbal cues  Exercises      General Comments General comments (skin integrity, edema, etc.): pt daughter present and supportive throughout session      Pertinent Vitals/Pain Pain Assessment Pain  Assessment: 0-10 Pain Score: 4  Pain Location: generalized discomfort when moving Pain Descriptors / Indicators: Discomfort, Sore Pain Intervention(s): Premedicated before session, Monitored during session, Limited activity within patient's tolerance    Home Living                          Prior Function            PT Goals (current goals can now be found in the care plan section) Acute Rehab PT Goals PT Goal Formulation: With patient Time For Goal Achievement: 08/17/24 Progress towards PT goals: Progressing toward goals    Frequency    Min 3X/week      PT Plan      Co-evaluation              AM-PAC PT 6 Clicks Mobility   Outcome Measure  Help needed turning from your back to your side while in a flat bed without using bedrails?: A Little Help needed moving from lying on your back to sitting on the side of a flat bed without using bedrails?: A Little Help needed moving to and from a bed to a chair (including a wheelchair)?: A Little Help needed standing up from a chair using your arms (e.g., wheelchair or bedside chair)?: A Little Help needed to walk in hospital room?: A Lot (<40') Help needed climbing 3-5 steps with a railing? : Total 6 Click Score: 15    End of Session   Activity Tolerance: Patient tolerated treatment well Patient left: in chair;with call bell/phone within reach;with family/visitor present Nurse Communication: Mobility status PT Visit Diagnosis: Other abnormalities of gait and mobility (R26.89);Muscle weakness (generalized) (M62.81);Pain     Time: 8671-8595 PT Time Calculation (min) (ACUTE ONLY): 36 min  Charges:    $Gait Training: 8-22 mins PT General Charges $$ ACUTE PT VISIT: 1 Visit                     Chevette Fee R. PTA Acute Rehabilitation Services Office: [phone]   [name] 08/09/2024, 3:11 PM

## 2024-08-09 NOTE — Consult Note (Signed)
 WOC Nurse wound follow up Wound type: surgical Measurement: 14 cm x 6 cm x 1.5 cm  Wound bed: pink, moist, with adipose tissue scattered throughout, granulation tissue Drainage (amount, consistency, odor) serosanguinous in cannister Periwound: intact Dressing procedure/placement/frequency: Removed old NPWT dressing  Filled wound with   __1__ piece of black foam  Sealed NPWT dressing at HG  Patient tolerated procedure well  WOC nurse will continue to provide NPWT dressing changed due to the complexity of the dressing change.     Thank you,  Doyal Polite, RN, MSN, [facility] WOC Team

## 2024-08-09 NOTE — Progress Notes (Signed)
 Inpatient Rehab Coordinator Note:  I met with patient at bedside to discuss CIR recommendations and goals/expectations of CIR stay.  We reviewed 3 hrs/day of therapy, physician follow up, and average length of stay 2 weeks (dependent upon progress) with goals of supervision intermittently.  Pt has good family support who check on her daily at baseline and provide assist with iadls as needed.  Expect she will likely be able to return home with this same level of support.  We reviewed need for insurance auth and I will start that process today.   Reche Lowers, PT, DPT Admissions Coordinator [phone] 08/09/24  3:19 PM

## 2024-08-09 NOTE — Progress Notes (Signed)
 7 Days Post-Op  Subjective: CC: Some R sided abdominal pain today. Tolerating cld without n/v. Passing flatus. Liquid bm overnight. Voiding. Oob to chair yesterday.   Objective: Vital signs in last 24 hours: Temp:  [98.2 F (36.8 C)-99.1 F (37.3 C)] 98.2 F (36.8 C) (08/11 0750) Pulse Rate:  [86-94] 93 (08/11 0750) Resp:  [16-17] 16 (08/11 0750) BP: (133-160)/(71-92) 133/71 (08/11 0750) SpO2:  [96 %-97 %] 97 % (08/11 0750) Last BM Date : 08/08/24  Intake/Output from previous day: 08/10 0701 - 08/11 0700 In: 120 [P.O.:120] Out: 140 [Urine:100; Drains:40] Intake/Output this shift: No intake/output data recorded.  PE: Gen:  Alert, NAD, pleasant Card:  Reg Pulm:  CTAB, no W/R/R, effort normal Abd: Soft, mild distension, mild R sided ttp without rigidity or guarding. +BS. Vac to midline wound with good seal. JP drain - SS, 40cc/24 hours.  Psych: A&Ox3   Lab Results:  Recent Labs    08/07/24 1155  WBC 8.6  HGB 8.3*  HCT 25.9*  PLT 304   BMET Recent Labs    08/09/24 0421  CREATININE 0.79   PT/INR No results for input(s): LABPROT, INR in the last 72 hours. CMP     Component Value Date/Time   NA 138 08/05/2024 0310   NA 140 01/17/2020 1326   K 3.5 08/05/2024 0310   CL 102 08/05/2024 0310   CO2 24 08/05/2024 0310   GLUCOSE 92 08/05/2024 0310   BUN 8 08/05/2024 0310   BUN 19 01/17/2020 1326   CREATININE 0.79 08/09/2024 0421   CALCIUM  8.7 (L) 08/05/2024 0310   PROT 6.9 08/02/2024 0532   PROT 6.3 01/17/2020 1326   ALBUMIN  3.2 (L) 08/02/2024 0532   ALBUMIN  4.1 01/17/2020 1326   AST 18 08/02/2024 0532   ALT 15 08/02/2024 0532   ALKPHOS 82 08/02/2024 0532   BILITOT 0.5 08/02/2024 0532   BILITOT 0.4 01/17/2020 1326   GFRNONAA >60 08/09/2024 0421   GFRAA 104 01/17/2020 1326   Lipase     Component Value Date/Time   LIPASE 24 08/02/2024 0532    Studies/Results: No results found.  Anti-infectives: Anti-infectives (From admission, onward)     Start     Dose/Rate Route Frequency Ordered Stop   08/02/24 1630  fluconazole  (DIFLUCAN ) IVPB 200 mg        200 mg 100 mL/hr over 60 Minutes Intravenous Every 24 hours 08/02/24 1541 08/07/24 1629   08/02/24 1600  piperacillin -tazobactam (ZOSYN ) IVPB 3.375 g        3.375 g 12.5 mL/hr over 240 Minutes Intravenous Every 8 hours 08/02/24 1541 08/07/24 1002   08/02/24 0615  piperacillin -tazobactam (ZOSYN ) IVPB 3.375 g        3.375 g 100 mL/hr over 30 Minutes Intravenous  Once 08/02/24 0604 08/02/24 0713        Assessment/Plan POD 7 s/p Exploratory laparotomy, Arlyss patch repair of perforated gastric ulcer by Dr. Dasie on 08/02/24 - Path with Inflammatory exudate consistent with origin from an ulcer. No mucosal tissue present.  - CT 8/8 neg for leak - Completed 5d post op Zosyn /Fluconazole   - Keep JP drain until tolerating solid food - BID PPI - H pylori stool antigen neg - Multimodal pain control. Avoid NSAIDs - Mobilize, PT/OT - currently recommending CIR - Pulm toilet - Wound care: wound vac to midline wound, change M/Th - Resume home medications for HTN, HLD and MS    FEN - FLD, BID PPI, decrease IVF to 50ml/hr - SLIV when  tolerating PO advancement  VTE - SCDs, Lovenox  ID - Zosyn /Fluconazole  for 5 days postop completed. None currently.    LOS: 7 days    [name], [facility] 08/09/2024, 9:06 AM Please see Amion for pager number during day hours 7:00am-4:30pm

## 2024-08-09 NOTE — Progress Notes (Signed)
 Inpatient Rehab Coordinator Note:  I met with patient at bedside to discuss CIR recommendations and goals/expectations of CIR stay.  We reviewed 3 hrs/day of therapy, physician follow up, and average length of stay 2 weeks (dependent upon progress) with goals of supervision intermittently.  Pt has good family support who check on her daily at baseline and provide assist with iadls as needed.  Expect she will likely be able to return home with this same level of support.  We reviewed need for insurance auth and I will start that process today.   Reche Lowers, PT, DPT Admissions Coordinator [phone] 08/09/24  3:19 PM

## 2024-08-09 NOTE — Progress Notes (Signed)
 Occupational Therapy Treatment Patient Details Name: [name] MRN: [id number] DOB: 23-Dec-1961 Today's Date: 08/09/2024   History of present illness 63 y.o. female presents to [facility] hospital on 08/02/2024 with abdominal pain. Imaging with free air in abdomen. Pt underwent ex-lap with repair of perforated gastric ulcer on 8/4. PMH includes HTN, HLD, MS, OA.   OT comments  Patient seen in conjunction with PTA in order to advance functional mobility. Patient recieved on commode, with patient requiring up to mod A for peri-care due to significant flexed posture and descreased balance. Patient able to complete 2 bouts of functional mobility, however noted decreased activity tolerance and discomfort from bilateral knees. Patient continues to make an excellent candidate for rehab >3 hours in order to return to prior level, OT will follow.       If plan is discharge home, recommend the following:  Assistance with cooking/housework;Assist for transportation;Help with stairs or ramp for entrance;A lot of help with walking and/or transfers;A lot of help with bathing/dressing/bathroom   Equipment Recommendations  None recommended by OT    Recommendations for Other Services      Precautions / Restrictions Precautions Precautions: Fall Recall of Precautions/Restrictions: Intact Precaution/Restrictions Comments: JP drain, wound vac. Restrictions Weight Bearing Restrictions Per Provider Order: No       Mobility Bed Mobility Overal bed mobility: Needs Assistance             General bed mobility comments: up on commode when OT entered    Transfers Overall transfer level: Needs assistance Equipment used: Rolling walker (2 wheels) Transfers: Sit to/from Stand, Bed to chair/wheelchair/BSC Sit to Stand: Min assist           General transfer comment: up to min A due to decreased balance and flexed posture     Balance Overall balance assessment: Needs assistance Sitting-balance  support: Feet supported Sitting balance-Leahy Scale: Fair     Standing balance support: Bilateral upper extremity supported, Reliant on assistive device for balance Standing balance-Leahy Scale: Poor                             ADL either performed or assessed with clinical judgement   ADL Overall ADL's : Needs assistance/impaired     Grooming: Wash/dry hands;Set up;Standing       Lower Body Bathing: Moderate assistance;Sit to/from stand;Sitting/lateral leans       Lower Body Dressing: Moderate assistance;Sitting/lateral leans;Sit to/from stand   Toilet Transfer: Minimal assistance;Ambulation;Rolling walker (2 wheels);Grab bars   Toileting- Clothing Manipulation and Hygiene: Sit to/from stand;Minimal assistance;Moderate assistance;Sitting/lateral lean;Cueing for compensatory techniques       Functional mobility during ADLs: Moderate assistance;Cueing for sequencing;Cueing for safety;Rolling walker (2 wheels) General ADL Comments: Patient seen in conjunction with PTA in order to advance functional mobility. Patient recieved on commode, with patient requiring up to mod A for peri-care due to significant flexed posture and descreased balance. Patient able to complete 2 bouts of functional mobility, however noted decreased activity tolerance and discomfort from bilateral knees. Patient continues to make an excellent candidate for rehab >3 hours in order to return to prior level, OT will follow.    Extremity/Trunk Assessment              Vision       Perception     Praxis     Communication Communication Communication: No apparent difficulties   Cognition Arousal: Alert Behavior During Therapy: WFL for tasks assessed/performed  Following commands: Intact        Cueing   Cueing Techniques: Verbal cues  Exercises      Shoulder Instructions       General Comments      Pertinent Vitals/ Pain        Pain Assessment Pain Assessment: Faces Pain Location: generalized discomfort when moving Pain Descriptors / Indicators: Discomfort, Sore Pain Intervention(s): Limited activity within patient's tolerance, Monitored during session, Repositioned  Home Living                                          Prior Functioning/Environment              Frequency  Min 2X/week        Progress Toward Goals  OT Goals(current goals can now be found in the care plan section)  Progress towards OT goals: Progressing toward goals  Acute Rehab OT Goals Patient Stated Goal: to get better OT Goal Formulation: With patient/family Time For Goal Achievement: 08/18/24 Potential to Achieve Goals: Good  Plan      Co-evaluation                 AM-PAC OT 6 Clicks Daily Activity     Outcome Measure   Help from another person eating meals?: None Help from another person taking care of personal grooming?: A Little Help from another person toileting, which includes using toliet, bedpan, or urinal?: A Lot Help from another person bathing (including washing, rinsing, drying)?: A Lot Help from another person to put on and taking off regular upper body clothing?: A Little Help from another person to put on and taking off regular lower body clothing?: A Lot 6 Click Score: 16    End of Session Equipment Utilized During Treatment: Rolling walker (2 wheels)  OT Visit Diagnosis: Unsteadiness on feet (R26.81);Other abnormalities of gait and mobility (R26.89);Other (comment);Pain   Activity Tolerance Patient tolerated treatment well   Patient Left in chair;with call bell/phone within reach;with chair alarm set;with family/visitor present   Nurse Communication Mobility status        Time: 8660-8596 OT Time Calculation (min): 24 min  Charges: OT General Charges $OT Visit: 1 Visit OT Treatments $Self Care/Home Management : 8-22 mins  Ronal Gift E. Elvan Ebron, OTR/L Acute  Rehabilitation Services [phone]   Ronal Gift Salt 08/09/2024, 3:03 PM

## 2024-08-09 NOTE — Plan of Care (Signed)

## 2024-08-09 NOTE — PMR Pre-admission (Shared)
 PMR Admission Coordinator Pre-Admission Assessment  Patient: [name] is an 63 y.o., female MRN: [id number] DOB: Feb 21, 1961 Height: 5' 3 (160 cm) Weight: 112.9 kg  Insurance Information HMO: ***    PPO: ***     PCP:      IPA:      80/20:      OTHER:  PRIMARY: [employer]      Policy#: [id number]       Subscriber: pt CM Name: ***      Phone#: ***     Fax#: *** Pre-Cert#: ***      Employer: *** Benefits:  Phone #: ***     Name: *** Eff. Date: ***     Deduct: ***      Out of Pocket Max: ***      Life Max: *** CIR: ***      SNF: *** Outpatient: ***     Co-Pay: *** Home Health: ***      Co-Pay: *** DME: ***     Co-Pay: *** Providers:  SECONDARY: Medicaid of [place]      Policy#: [id number]     Phone#:   Financial Counselor:       Phone#:   The "Data Collection Information Summary" for patients in Inpatient Rehabilitation Facilities with attached "Privacy Act Statement-Health Care Records" was provided and verbally reviewed with: Patient and Family  Emergency Contact Information Contact Information     Name Relation Home Work Mobile   [name] Daughter   [phone]   [name]   [phone]      Other Contacts     Name Relation Home Work Mobile   [name],[name] Son   [phone]       Current Medical History  Patient Admitting Diagnosis: debility  History of Present Illness: Pt is a 63 y/o female with PMH of Multiple Sclerosis, HTN, HLD, OA who was admitted to [facility] on 08/02/24 with c/o abdominal pain and SOB.  In ED BP 140/103, HR 105, with chest wall tenderness and abdominal tenderness.  Labs notable for leukocytosis, and elevated d-dimer 4.42.  Triage xray concerning for air under the diaphragm bilaterally and CT was ordered which showed free air with gastric wall thickening and ill-defined perigastric edema.  General surgery was consulted and recommended OR for exploratory lap.  On 8/4 pt underwent ex lap with graham patch repair of  perforated gastric ulcer per Dr. Dasie.  Post op she had NGT to suction as well as zosyn /fluconazole  5d post op.  Her UGI on POD 3 showed potential leak, but CT on 8/8 negative for leak.  Her NG tube was d/c'd.  Recommendations to avoid NSAIDs as pain control method.  She has a wound vac to her midline incision with recommended changes with WOC nurse on M/Th.  She is tolerating thing liquids and advancing diet as tolerated.  Therapy ongoing and pt has been recommended for CIR due to functional decline.   Patient's medical record from [name] has been reviewed by the rehabilitation admission coordinator and physician.  Past Medical History  Past Medical History:  Diagnosis Date   Arthritis    Back pain    Chronic knee pain    Edema of both lower extremities    High blood pressure    High cholesterol    Joint pain    Multiple sclerosis (HCC)    Neuromuscular disorder (HCC)    Multiple Sclerosis over 20 years   Obesity    Vitamin D  deficiency  Has the patient had major surgery during 100 days prior to admission? Yes  Family History   family history includes Cancer in her mother; Heart disease in her father; High Cholesterol in her father and mother; High blood pressure in her father and mother; Obesity in her father; Thyroid disease in her mother.  Current Medications  Current Facility-Administered Medications:    acetaminophen  (TYLENOL ) tablet 1,000 mg, 1,000 mg, Oral, Q6H, Maczis, Michael M, PA-C, 1,000 mg at 08/09/24 1500   diphenhydrAMINE  (BENADRYL ) injection 12.5 mg, 12.5 mg, Intravenous, Q6H PRN, Dasie Leonor CROME, MD   enoxaparin  (LOVENOX ) injection 60 mg, 60 mg, Subcutaneous, Q24H, Maczis, Michael M, PA-C, 60 mg at 08/08/24 1726   feeding supplement (ENSURE PLUS HIGH PROTEIN) liquid 237 mL, 237 mL, Oral, BID BM, Maczis, Michael M, PA-C, 237 mL at 08/09/24 1451   hydrALAZINE  (APRESOLINE ) injection 5 mg, 5 mg, Intravenous, Q6H PRN, Azadegan, Maryam, MD   hydrochlorothiazide   (HYDRODIURIL ) tablet 25 mg, 25 mg, Oral, q AM, Maczis, Michael M, PA-C, 25 mg at 08/09/24 1122   HYDROmorphone  (DILAUDID ) injection 0.5 mg, 0.5 mg, Intravenous, Q4H PRN, Dasie Leonor L, MD, 0.5 mg at 08/05/24 1719   lactated ringers  infusion, , Intravenous, Continuous, Maczis, Michael M, PA-C, Last Rate: 50 mL/hr at 08/09/24 0945, Rate Change at 08/09/24 0945   methocarbamol  (ROBAXIN ) tablet 500 mg, 500 mg, Oral, TID, Chen, Lydia D, RPH, 500 mg at 08/09/24 1459   ondansetron  (ZOFRAN ) injection 4 mg, 4 mg, Intravenous, Q6H PRN, Maczis, Michael M, PA-C   oxyCODONE  (Oxy IR/ROXICODONE ) immediate release tablet 5-10 mg, 5-10 mg, Oral, Q4H PRN, Maczis, Michael M, PA-C, 5 mg at 08/09/24 1123   pantoprazole  (PROTONIX ) injection 40 mg, 40 mg, Intravenous, Q12H, Dasie Leonor L, MD, 40 mg at 08/09/24 1122   phenol (CHLORASEPTIC) mouth spray 1 spray, 1 spray, Mouth/Throat, PRN, Dasie Leonor CROME, MD   rosuvastatin  (CRESTOR ) tablet 10 mg, 10 mg, Oral, QHS, Maczis, Michael M, PA-C   sodium chloride  flush (NS) 0.9 % injection 10-40 mL, 10-40 mL, Intracatheter, PRN, Dasie Leonor CROME, MD   Teriflunomide  TABS 14 mg, 14 mg, Oral, QHS, Maczis, Michael M, PA-C  Patients Current Diet:  Diet Order             Diet full liquid Fluid consistency: Thin  Diet effective now                   Precautions / Restrictions Precautions Precautions: Fall Precaution/Restrictions Comments: JP drain, wound vac. Restrictions Weight Bearing Restrictions Per Provider Order: No   Has the patient had 2 or more falls or a fall with injury in the past year? Yes  Prior Activity Level Limited Community (1-2x/wk): mod I with rollator/RW in home/community, still driving up until recently, able to manage ADLs/most iADLs at home without assist but family does check on her daily  Prior Functional Level Self Care: Did the patient need help bathing, dressing, using the toilet or eating? Independent  Indoor Mobility: Did the  patient need assistance with walking from room to room (with or without device)? Independent  Stairs: Did the patient need assistance with internal or external stairs (with or without device)? Needed some help  Functional Cognition: Did the patient need help planning regular tasks such as shopping or remembering to take medications? Independent  Patient Information Are you of Hispanic, Latino/a,or Spanish origin?: A. No, not of Hispanic, Latino/a, or Spanish origin What is your race?: B. Black or African American Do you need or  want an interpreter to communicate with a doctor or health care staff?: 0. No  Patient's Response To:  Health Literacy and Transportation Is the patient able to respond to health literacy and transportation needs?: Yes Health Literacy - How often do you need to have someone help you when you read instructions, pamphlets, or other written material from your doctor or pharmacy?: Never In the past 12 months, has lack of transportation kept you from medical appointments or from getting medications?: No In the past 12 months, has lack of transportation kept you from meetings, work, or from getting things needed for daily living?: No  Home Assistive Devices / Equipment Home Equipment: Agricultural consultant (2 wheels), Rollator (4 wheels), Tub bench, Grab bars - toilet, Grab bars - tub/shower, Adaptive equipment, Hand held shower head, Lift chair, Other (comment) (bed transfer handle)  Prior Device Use: Indicate devices/aids used by the patient prior to current illness, exacerbation or injury? Walker  Current Functional Level Cognition  Orientation Level: Oriented to person, Oriented to place, Oriented to time, Oriented to situation    Extremity Assessment (includes Sensation/Coordination)  Upper Extremity Assessment: Right hand dominant, Overall WFL for tasks assessed (Gross B UE strength 4 to 4+/5)  Lower Extremity Assessment: Defer to PT evaluation    ADLs  Overall ADL's  : Needs assistance/impaired Eating/Feeding: Independent, Sitting Grooming: Wash/dry hands, Set up, Standing Upper Body Bathing: Minimal assistance, Sitting, Cueing for compensatory techniques Upper Body Bathing Details (indicate cue type and reason): simulated Lower Body Bathing: Moderate assistance, Sit to/from stand, Sitting/lateral leans Lower Body Bathing Details (indicate cue type and reason): simulated Upper Body Dressing : Set up, Contact guard assist, Sitting Lower Body Dressing: Moderate assistance, Sitting/lateral leans, Sit to/from stand Toilet Transfer: Minimal assistance, Ambulation, Rolling walker (2 wheels), Grab bars Toileting- Clothing Manipulation and Hygiene: Sit to/from stand, Minimal assistance, Moderate assistance, Sitting/lateral lean, Cueing for compensatory techniques Functional mobility during ADLs: Moderate assistance, Cueing for sequencing, Cueing for safety, Rolling walker (2 wheels) General ADL Comments: Patient seen in conjunction with PTA in order to advance functional mobility. Patient recieved on commode, with patient requiring up to mod A for peri-care due to significant flexed posture and descreased balance. Patient able to complete 2 bouts of functional mobility, however noted decreased activity tolerance and discomfort from bilateral knees. Patient continues to make an excellent candidate for rehab >3 hours in order to return to prior level, OT will follow.    Mobility  Overal bed mobility: Needs Assistance Bed Mobility: Sidelying to Sit, Rolling Rolling: Supervision Sidelying to sit: Supervision, HOB elevated, Used rails Sit to supine: Contact guard assist Sit to sidelying: Mod assist, Used rails General bed mobility comments: increased time but no assist needed    Transfers  Overall transfer level: Needs assistance Equipment used: Rolling walker (2 wheels) Transfers: Sit to/from Stand, Bed to chair/wheelchair/BSC Sit to Stand: Min assist Bed to/from  chair/wheelchair/BSC transfer type:: Step pivot Step pivot transfers: Contact guard assist General transfer comment: up to min A  from slightly elevate EOB due to decreased balance and flexed posture, increased time needed to come to upright standing    Ambulation / Gait / Stairs / Wheelchair Mobility  Ambulation/Gait Ambulation/Gait assistance: Contact guard assist, +2 safety/equipment (chair follow) Gait Distance (Feet): 15 Feet (+ 25' x2 with seated rest breaks between bouts) Assistive device: Rolling walker (2 wheels) Gait Pattern/deviations: Decreased step length - left, Decreased step length - right, Decreased stride length, Knee flexed in stance - right, Knee flexed  in stance - left, Trunk flexed General Gait Details: slow effortful steps with audible crepitus from bil kness, knees and trunk flexed throughout Gait velocity: decr Pre-gait activities: pt takes 2 side steps to left side with support of RW, minA x 2    Posture / Balance Dynamic Sitting Balance Sitting balance - Comments: supervision sitting EOB Balance Overall balance assessment: Needs assistance Sitting-balance support: Feet supported Sitting balance-Leahy Scale: Fair Sitting balance - Comments: supervision sitting EOB Standing balance support: Bilateral upper extremity supported, Reliant on assistive device for balance Standing balance-Leahy Scale: Poor Standing balance comment: heavy reliance on UE support    Special considerations/life events  Wound Vac yes, midline wound with WOC nurse following and Skin midline incision    Previous Home Environment (from acute therapy documentation) Living Arrangements: Alone Available Help at Discharge: Family, Available PRN/intermittently (family visits daily to assist with IADLs) Type of Home: House Home Layout: One level Home Access: Stairs to enter, Ramped entrance Entrance Stairs-Rails: Right Entrance Stairs-Number of Steps: 3 Bathroom Shower/Tub: Teacher, music: Handicapped height  Discharge Living Setting Plans for Discharge Living Setting: Patient's home Type of Home at Discharge: House Discharge Home Layout: One level Discharge Home Access: Ramped entrance Discharge Bathroom Shower/Tub: Tub/shower unit Discharge Bathroom Toilet: Handicapped height Discharge Bathroom Accessibility: Yes How Accessible: Accessible via walker Does the patient have any problems obtaining your medications?: No  Social/Family/Support Systems Anticipated Caregiver: daughter, [name] is primary contact Anticipated Caregiver's Contact Information: [phone] Ability/Limitations of Caregiver: n/a Caregiver Availability: Intermittent Discharge Plan Discussed with Primary Caregiver: Yes Is Caregiver In Agreement with Plan?: Yes Does Caregiver/Family have Issues with Lodging/Transportation while Pt is in Rehab?: No  Goals Patient/Family Goal for Rehab: PT/OT supervision to mod I with LRAD, SLP n/a Expected length of stay: 10-14 days Additional Information: Discharge plan: expect d/c to pt's home with daily support from family/friends Pt/Family Agrees to Admission and willing to participate: Yes Program Orientation Provided & Reviewed with Pt/Caregiver Including Roles  & Responsibilities: Yes  Decrease burden of Care through IP rehab admission: n/a  Possible need for SNF placement upon discharge:  Not anticipated.  Expect pt to be able to discharge home with intermittent support from family/friends as per her baseline.    Patient Condition: I have reviewed medical records from [facility], spoken with [facility] team, and patient. I met with patient at the bedside for inpatient rehabilitation assessment.  Patient will benefit from ongoing PT and OT, can actively participate in 3 hours of therapy a day 5 days of the week, and can make measurable gains during the admission.  Patient will also benefit from the coordinated team approach during an Inpatient  Acute Rehabilitation admission.  The patient will receive intensive therapy as well as Rehabilitation physician, nursing, social worker, and care management interventions.  Due to safety, skin/wound care, disease management, medication administration, pain management, and patient education the patient requires 24 hour a day rehabilitation nursing.  The patient is currently min assist with mobility and basic ADLs.  Discharge setting and therapy post discharge at home with home health is anticipated.  Patient has agreed to participate in the Acute Inpatient Rehabilitation Program and will admit pending insurance auth ***.  Preadmission Screen Completed By:  Reche FORBES Lowers, 08/09/2024 3:21 PM ______________________________________________________________________   Discussed status with Dr. PIERRETTE on *** at *** and received approval for admission today.  Admission Coordinator:  Kaoir Loree E Masson Nalepa, PT, time PIERRETTEPattricia ***   Assessment/Plan: Diagnosis: *** Does the need  for close, 24 hr/day Medical supervision in concert with the patient's rehab needs make it unreasonable for this patient to be served in a less intensive setting? {yes_no_potentially:3041433} Co-Morbidities requiring supervision/potential complications: *** Due to {due un:6958565}, does the patient require 24 hr/day rehab nursing? {yes_no_potentially:3041433} Does the patient require coordinated care of a physician, rehab nurse, PT, OT, and SLP to address physical and functional deficits in the context of the above medical diagnosis(es)? {yes_no_potentially:3041433} Addressing deficits in the following areas: {deficits:3041436} Can the patient actively participate in an intensive therapy program of at least 3 hrs of therapy 5 days a week? {yes_no_potentially:3041433} The potential for patient to make measurable gains while on inpatient rehab is {potential:3041437} Anticipated functional outcomes upon discharge from inpatient rehab: {functional  outcomes:304600100} PT, {functional outcomes:304600100} OT, {functional outcomes:304600100} SLP Estimated rehab length of stay to reach the above functional goals is: *** Anticipated discharge destination: {anticipated dc setting:21604} 10. Overall Rehab/Functional Prognosis: {potential:3041437}   MD Signature: ***

## 2024-08-10 MED ORDER — PANTOPRAZOLE SODIUM 40 MG PO TBEC
40.0000 mg | DELAYED_RELEASE_TABLET | Freq: Two times a day (BID) | ORAL | Status: DC
  Administered 2024-08-10 – 2024-08-17 (×17): 40 mg via ORAL
  Filled 2024-08-10 (×15): qty 1

## 2024-08-10 NOTE — Progress Notes (Signed)
 Inpatient Rehab Admissions Coordinator:   Received request from [facility] requesting information for attending service information for pre-determination peer to peer.  I confirmed they wanted attending service, not PMR service as it was a PMR request.  I asked if [facility] Medical Director was a PMR MD as the patient has a right to PMR representation.  Case manager was not sure.  I will fax this along with contact information for attending service to complete P2P.    Reche Lowers, PT, DPT Admissions Coordinator [phone] 08/10/24  10:07 AM

## 2024-08-10 NOTE — Plan of Care (Addendum)
 Advanced to soft diet- tolerating well.  Wound vac intac, no output for shift.  LBM 8/12, voiding BSC.  Prn oxy 5mg  requested for pain.  JP drain intact, 80ml serous output Problem: Education: Goal: Knowledge of General Education information will improve Description: Including pain rating scale, medication(s)/side effects and non-pharmacologic comfort measures Outcome: Progressing   Problem: Health Behavior/Discharge Planning: Goal: Ability to manage health-related needs will improve Outcome: Progressing   Problem: Clinical Measurements: Goal: Ability to maintain clinical measurements within normal limits will improve Outcome: Progressing Goal: Will remain free from infection Outcome: Progressing Goal: Diagnostic test results will improve Outcome: Progressing Goal: Respiratory complications will improve Outcome: Progressing Goal: Cardiovascular complication will be avoided Outcome: Progressing

## 2024-08-10 NOTE — Progress Notes (Signed)
 Mobility Specialist Progress Note:    08/10/24 1443  Mobility  Activity Pivoted/transferred to/from [facility]  Level of Assistance Contact guard assist, steadying assist  Assistive Device Front wheel walker  Distance Ambulated (ft) 5 ft  Activity Response Tolerated well  Mobility Referral Yes  Mobility visit 1 Mobility  Mobility Specialist Start Time (ACUTE ONLY) 1428  Mobility Specialist Stop Time (ACUTE ONLY) 1441  Mobility Specialist Time Calculation (min) (ACUTE ONLY) 13 min   Received pt on BSC and agreeable to get to bed. No physical assistance needed. No c/o. Returned pt to bed. Personal belongings and call light within reach. All needs met. Daughter present.  Lavanda Pollack Mobility Specialist  Please contact via [employer] or  Rehab Office [phone]

## 2024-08-10 NOTE — Plan of Care (Signed)
  Problem: Nutrition: Goal: Adequate nutrition will be maintained Outcome: Progressing   Problem: Activity: Goal: Risk for activity intolerance will decrease Outcome: Progressing   Problem: Safety: Goal: Ability to remain free from injury will improve Outcome: Progressing

## 2024-08-10 NOTE — Progress Notes (Signed)
 8 Days Post-Op  Subjective: CC: R sided abdominal pain improved today. Tolerating FLD without n/v. Passing flatus. BM this morning. Voiding. Oob with therapies yesterday.   Objective: Vital signs in last 24 hours: Temp:  [97.8 F (36.6 C)-98.4 F (36.9 C)] 98.4 F (36.9 C) (08/12 0816) Pulse Rate:  [76-89] 84 (08/12 0816) Resp:  [17-18] 17 (08/12 0816) BP: (153-168)/(73-83) 154/78 (08/12 0816) SpO2:  [94 %-99 %] 97 % (08/12 0816) Last BM Date : 08/09/24  Intake/Output from previous day: 08/11 0701 - 08/12 0700 In: 1020 [P.O.:1020] Out: 205 [Drains:205] Intake/Output this shift: No intake/output data recorded.  PE: Gen:  Alert, NAD, pleasant Card:  Reg Pulm:  CTAB, no W/R/R, effort normal Abd: Soft, mild distension, improved R sided ttp without rigidity or guarding. +BS. Vac to midline wound with good seal. JP drain - SS, 205 cc/24 hours.  Psych: A&Ox3   Lab Results:  Recent Labs    08/07/24 1155  WBC 8.6  HGB 8.3*  HCT 25.9*  PLT 304   BMET Recent Labs    08/09/24 0421  CREATININE 0.79   PT/INR No results for input(s): LABPROT, INR in the last 72 hours. CMP     Component Value Date/Time   NA 138 08/05/2024 0310   NA 140 01/17/2020 1326   K 3.5 08/05/2024 0310   CL 102 08/05/2024 0310   CO2 24 08/05/2024 0310   GLUCOSE 92 08/05/2024 0310   BUN 8 08/05/2024 0310   BUN 19 01/17/2020 1326   CREATININE 0.79 08/09/2024 0421   CALCIUM  8.7 (L) 08/05/2024 0310   PROT 6.9 08/02/2024 0532   PROT 6.3 01/17/2020 1326   ALBUMIN  3.2 (L) 08/02/2024 0532   ALBUMIN  4.1 01/17/2020 1326   AST 18 08/02/2024 0532   ALT 15 08/02/2024 0532   ALKPHOS 82 08/02/2024 0532   BILITOT 0.5 08/02/2024 0532   BILITOT 0.4 01/17/2020 1326   GFRNONAA >60 08/09/2024 0421   GFRAA 104 01/17/2020 1326   Lipase     Component Value Date/Time   LIPASE 24 08/02/2024 0532    Studies/Results: No results found.  Anti-infectives: Anti-infectives (From admission, onward)     Start     Dose/Rate Route Frequency Ordered Stop   08/02/24 1630  fluconazole  (DIFLUCAN ) IVPB 200 mg        200 mg 100 mL/hr over 60 Minutes Intravenous Every 24 hours 08/02/24 1541 08/07/24 1629   08/02/24 1600  piperacillin -tazobactam (ZOSYN ) IVPB 3.375 g        3.375 g 12.5 mL/hr over 240 Minutes Intravenous Every 8 hours 08/02/24 1541 08/07/24 1002   08/02/24 0615  piperacillin -tazobactam (ZOSYN ) IVPB 3.375 g        3.375 g 100 mL/hr over 30 Minutes Intravenous  Once 08/02/24 0604 08/02/24 0713        Assessment/Plan POD 8 s/p Exploratory laparotomy, Arlyss patch repair of perforated gastric ulcer by Dr. Dasie on 08/02/24 - Path with Inflammatory exudate consistent with origin from an ulcer. No mucosal tissue present.  - CT 8/8 neg for leak - Completed 5d post op Zosyn /Fluconazole   - Keep JP drain until tolerating solid food - BID PPI - H pylori stool antigen neg - Multimodal pain control. Avoid NSAIDs - Mobilize, PT/OT - currently recommending CIR - Pulm toilet - Wound care: wound vac to midline wound, change M/Th - Home medications for HTN, HLD and MS    FEN - Soft, BID PPI,SLIV  VTE - SCDs, Lovenox  ID - Zosyn /Fluconazole   for 5 days postop completed. None currently.    LOS: 8 days    [name], [facility] Surgery 08/10/2024, 9:11 AM Please see Amion for pager number during day hours 7:00am-4:30pm

## 2024-08-11 MED ORDER — LOSARTAN POTASSIUM 50 MG PO TABS
100.0000 mg | ORAL_TABLET | Freq: Every day | ORAL | Status: DC
  Administered 2024-08-11 – 2024-08-17 (×8): 100 mg via ORAL
  Filled 2024-08-11 (×8): qty 2

## 2024-08-11 NOTE — Plan of Care (Signed)
  Problem: Activity: Goal: Risk for activity intolerance will decrease Outcome: Progressing   Problem: Elimination: Goal: Will not experience complications related to bowel motility Outcome: Progressing   Problem: Coping: Goal: Level of anxiety will decrease Outcome: Progressing

## 2024-08-11 NOTE — Progress Notes (Signed)
 Inpatient Rehab Admissions Coordinator:   Received a denial for CIR from [facility].  I met with pt at bedside to update and she would like to appeal.  I've sent expedited appeal to [facility] and we should expect a response within 72 hours.   Reche Lowers, PT, DPT Admissions Coordinator [phone] 08/11/24  11:16 AM

## 2024-08-11 NOTE — Progress Notes (Signed)
 9 Days Post-Op  Subjective: Reports she ate a little bit yesterday, but not a lot due to pain. Feeling better today. Denies nausea.  Objective: Vital signs in last 24 hours: Temp:  [97.7 F (36.5 C)-98.2 F (36.8 C)] 97.9 F (36.6 C) (08/13 0827) Pulse Rate:  [88-91] 88 (08/13 0827) Resp:  [17-18] 18 (08/13 0827) BP: (159-183)/(88-98) 183/88 (08/13 0827) SpO2:  [97 %-100 %] 100 % (08/13 0827) Last BM Date : 08/09/24  Intake/Output from previous day: 08/12 0701 - 08/13 0700 In: 360 [P.O.:360] Out: 130 [Drains:130] Intake/Output this shift: No intake/output data recorded.  PE: Gen:  Alert, NAD, pleasant Pulm:  nonlabored respirations on room air Abd: Soft, nondistended. Vac to midline wound with good seal. JP with serous fluid. Psych: A&Ox3   Lab Results:  No results for input(s): WBC, HGB, HCT, PLT in the last 72 hours.  BMET Recent Labs    08/09/24 0421  CREATININE 0.79   PT/INR No results for input(s): LABPROT, INR in the last 72 hours. CMP     Component Value Date/Time   NA 138 08/05/2024 0310   NA 140 01/17/2020 1326   K 3.5 08/05/2024 0310   CL 102 08/05/2024 0310   CO2 24 08/05/2024 0310   GLUCOSE 92 08/05/2024 0310   BUN 8 08/05/2024 0310   BUN 19 01/17/2020 1326   CREATININE 0.79 08/09/2024 0421   CALCIUM  8.7 (L) 08/05/2024 0310   PROT 6.9 08/02/2024 0532   PROT 6.3 01/17/2020 1326   ALBUMIN  3.2 (L) 08/02/2024 0532   ALBUMIN  4.1 01/17/2020 1326   AST 18 08/02/2024 0532   ALT 15 08/02/2024 0532   ALKPHOS 82 08/02/2024 0532   BILITOT 0.5 08/02/2024 0532   BILITOT 0.4 01/17/2020 1326   GFRNONAA >60 08/09/2024 0421   GFRAA 104 01/17/2020 1326   Lipase     Component Value Date/Time   LIPASE 24 08/02/2024 0532    Studies/Results: No results found.  Anti-infectives: Anti-infectives (From admission, onward)    Start     Dose/Rate Route Frequency Ordered Stop   08/02/24 1630  fluconazole  (DIFLUCAN ) IVPB 200 mg        200  mg 100 mL/hr over 60 Minutes Intravenous Every 24 hours 08/02/24 1541 08/07/24 1629   08/02/24 1600  piperacillin -tazobactam (ZOSYN ) IVPB 3.375 g        3.375 g 12.5 mL/hr over 240 Minutes Intravenous Every 8 hours 08/02/24 1541 08/07/24 1002   08/02/24 0615  piperacillin -tazobactam (ZOSYN ) IVPB 3.375 g        3.375 g 100 mL/hr over 30 Minutes Intravenous  Once 08/02/24 0604 08/02/24 0713        Assessment/Plan POD 9 s/p Exploratory laparotomy, Arlyss patch repair of perforated gastric ulcer by Dr. Dasie on 08/02/24 - Path with Inflammatory exudate consistent with origin from an ulcer. No mucosal tissue present.  - CT 8/8 neg for leak - Completed 5d post op Zosyn /Fluconazole   - Continue soft diet, remove JP drain today - BID PPI - H pylori stool antigen neg - Multimodal pain control. Avoid NSAIDs - Mobilize, PT/OT - currently recommending CIR - Pulm toilet - Wound care: wound vac to midline wound, change M/Th - Home medications for HTN, HLD and MS    FEN - Soft, BID PPI,SLIV  VTE - SCDs, Lovenox  ID - Zosyn /Fluconazole  for 5 days postop completed. None currently.    LOS: 9 days    Leonor LITTIE Dasie, MD [facility] Surgery 08/11/2024, 8:27 AM Please  see Amion for pager number during day hours 7:00am-4:30pm

## 2024-08-11 NOTE — Progress Notes (Signed)
 Occupational Therapy Treatment Patient Details Name: [name] MRN: [id number] DOB: [birth year] Today's Date: 08/11/2024   History of present illness 63 y.o. female presents to [facility] hospital on 08/02/2024 with abdominal pain. Imaging with free air in abdomen. Pt underwent ex-lap with repair of perforated gastric ulcer on 8/4. PMH includes HTN, HLD, MS, OA.   OT comments  Pt working with PTA upon OT arrival. Dovetailed with PTA to maximize pt activity tolerance. OT session focused on therapeutic exercise to increase dynamic standing balance for carryover to ADLs in standing, such as toileting hygiene. Pt stood with RW and performed reaching outside of base of support in all planes alternating reaching with R and L UE and using opposite UE for support on RW with CGA of OT for safety x5 reps in each plane with each UE with no LOB. OT also educated pt in techniques for increased safety and independence with toileting task with pt performing x3 step pivot transfers with RW with up to Min assist to power up and CGA once in standing and demonstrating ability to perform toileting hygiene and clothing management in simulated activity in standing with CGA-Min assist utilizing RW for unilateral UE support and with OT providing CGA for safety. Pt also demonstrating ability to complete functional mobility with a RW with CGA +2 for safety/chair follow. Pt participated well in session and is making progress toward OT goals.pt will benefit from continued acute skilled oT services. Post acute discharge, pt will benefit from intensive inpatient skilled rehab services > 3 hours per day to maximize rehab potential.       If plan is discharge home, recommend the following:  A lot of help with bathing/dressing/bathroom;A lot of help with walking and/or transfers;Assistance with cooking/housework;Assist for transportation;Help with stairs or ramp for entrance   Equipment Recommendations  None recommended by OT (Pt  already has needed equipment)    Recommendations for Other Services      Precautions / Restrictions Precautions Precautions: Fall Recall of Precautions/Restrictions: Intact Precaution/Restrictions Comments: JP drain, wound vac. Restrictions Weight Bearing Restrictions Per Provider Order: No       Mobility Bed Mobility Overal bed mobility: Needs Assistance             General bed mobility comments: Pt sitting at EOB working with PTA upon OT arrival.    Transfers Overall transfer level: Needs assistance Equipment used: Rolling walker (2 wheels) Transfers: Sit to/from Stand, Bed to chair/wheelchair/BSC Sit to Stand: Contact guard assist, Min assist     Step pivot transfers: Contact guard assist     General transfer comment: up to min A  with power up due to decreased balance and flexed posture; increased time needed to come to upright standing; CGA once in standing     Balance Overall balance assessment: Needs assistance Sitting-balance support: Single extremity supported, No upper extremity supported, Feet supported Sitting balance-Leahy Scale: Fair Sitting balance - Comments: supervision sitting EOB   Standing balance support: Single extremity supported, Bilateral upper extremity supported, During functional activity, Reliant on assistive device for balance Standing balance-Leahy Scale: Poor Standing balance comment: Able to perform reaching outside of base of support in all directions with unilateral UE support and CGA for safety                           ADL either performed or assessed with clinical judgement   ADL Overall ADL's : Needs assistance/impaired  Toilet Transfer: Contact guard assist;Minimal assistance;BSC/3in1;Rolling walker (2 wheels) (cues for hand placement) Toilet Transfer Details (indicate cue type and reason): simulated at recliner x3; pt requiring up to Min assist to power up and CGA for stepping  once in standing Toileting- Clothing Manipulation and Hygiene: Contact guard assist;Minimal assistance;Sit to/from stand;Cueing for compensatory techniques Toileting - Clothing Manipulation Details (indicate cue type and reason): simulated in standing with use of RW for unilateral UE support and CGA of OT for safety     Functional mobility during ADLs: Contact guard assist;+2 for safety/equipment;Rolling walker (2 wheels) (+2 for chair follow) General ADL Comments: Pt with noted improvements in activity tolerance, but conitnuing to fatigue quickly and is limited by pain in B knees with prolonged standing    Extremity/Trunk Assessment Upper Extremity Assessment Upper Extremity Assessment: Overall WFL for tasks assessed (Gross B UE strength 4/5)   Lower Extremity Assessment Lower Extremity Assessment: Defer to PT evaluation        Vision       Perception     Praxis     Communication Communication Communication: No apparent difficulties   Cognition Arousal: Alert Behavior During Therapy: WFL for tasks assessed/performed Cognition: No apparent impairments             OT - Cognition Comments: Pt AAOx4 and pleasant throughout session.                 Following commands: Intact        Cueing   Cueing Techniques: Verbal cues  Exercises Exercises: Other exercises Other Exercises Other Exercises: Pt stood with RW and performed reaching outside of base of support in all planes alternating reaching with R and L UE and using opposite UE for support on RW with CGA of OT for safety to increase dynamic standing balance for carryover to ADLs in standing, such as toileting hygeine. Pt with no LOB with reaching outside of base of support.    Shoulder Instructions       General Comments Pt finishing session with PTA upon OT arrival. Pt's daughter present and supportive throughout session.    Pertinent Vitals/ Pain       Pain Assessment Pain Assessment: Faces Faces Pain  Scale: Hurts a little bit Pain Location: generalized discomfort when moving; B knees in prolonged standing Pain Descriptors / Indicators: Discomfort, Sore Pain Intervention(s): Limited activity within patient's tolerance, Monitored during session, Premedicated before session, Repositioned  Home Living                                          Prior Functioning/Environment              Frequency  Min 2X/week        Progress Toward Goals  OT Goals(current goals can now be found in the care plan section)  Progress towards OT goals: Progressing toward goals  Acute Rehab OT Goals Patient Stated Goal: to continue to get stronger and more independent  Plan      Co-evaluation                 AM-PAC OT 6 Clicks Daily Activity     Outcome Measure   Help from another person eating meals?: None Help from another person taking care of personal grooming?: A Little Help from another person toileting, which includes using toliet, bedpan, or urinal?: A Little Help from  another person bathing (including washing, rinsing, drying)?: A Lot Help from another person to put on and taking off regular upper body clothing?: A Little Help from another person to put on and taking off regular lower body clothing?: A Lot 6 Click Score: 17    End of Session Equipment Utilized During Treatment: Rolling walker (2 wheels)  OT Visit Diagnosis: Unsteadiness on feet (R26.81);Other abnormalities of gait and mobility (R26.89);Other (comment) (decreased activity tolerance)   Activity Tolerance Patient tolerated treatment well   Patient Left in chair;with call bell/phone within reach;with family/visitor present   Nurse Communication Mobility status        Time: 8394-8376 OT Time Calculation (min): 18 min  Charges: OT General Charges $OT Visit: 1 Visit OT Treatments $Self Care/Home Management : 8-22 mins  Margarie Rockey HERO., OTR/L, MA Acute Rehab [phone]   Margarie FORBES Horns 08/11/2024, 6:30 PM

## 2024-08-11 NOTE — Progress Notes (Signed)
 Physical Therapy Treatment Patient Details Name: [name] MRN: [id number] DOB: May 20, 1961 Today's Date: 08/11/2024   History of Present Illness 63 y.o. female presents to [facility] hospital on 08/02/2024 with abdominal pain. Imaging with free air in abdomen. Pt underwent ex-lap with repair of perforated gastric ulcer on 8/4. PMH includes HTN, HLD, MS, OA.    PT Comments  Pt resting in bed on arrival, pleasant and agreeable to session and demonstrating steady progress towards acute goals. Pt continues to be limited in safe mobility by decreased activity tolerance, global weakness, and poor balance/postural reactions. Pt demonstrating gait with grossly CGA for safety with chair follow provided as pt continues to fatigue quickly, needing seated rest. Pt continues to require min A to boost and come to stand to RW as pt with flexed knees and trunk on rise and needing increased time to come to standing. OT arriving during session and pt left with OT on handoff at end of session. Pt continues to benefit from skilled PT services to progress toward functional mobility goals.      If plan is discharge home, recommend the following: Assistance with cooking/housework;Assist for transportation;Help with stairs or ramp for entrance;A little help with walking and/or transfers;A little help with bathing/dressing/bathroom   Can travel by private vehicle        Equipment Recommendations  Other (comment)    Recommendations for Other Services       Precautions / Restrictions Precautions Precautions: Fall Recall of Precautions/Restrictions: Intact Precaution/Restrictions Comments: JP drain, wound vac. Restrictions Weight Bearing Restrictions Per Provider Order: No     Mobility  Bed Mobility Overal bed mobility: Needs Assistance Bed Mobility: Sidelying to Sit, Rolling Rolling: Supervision Sidelying to sit: Supervision, HOB elevated, Used rails       General bed mobility comments: increased time but  no assist needed    Transfers Overall transfer level: Needs assistance Equipment used: Rolling walker (2 wheels) Transfers: Sit to/from Stand, Bed to chair/wheelchair/BSC Sit to Stand: Min assist           General transfer comment: up to min A  due to decreased balance and flexed posture, increased time needed to come to upright standing    Ambulation/Gait Ambulation/Gait assistance: Contact guard assist, +2 safety/equipment (chair follow) Gait Distance (Feet): 55 Feet Assistive device: Rolling walker (2 wheels) Gait Pattern/deviations: Decreased step length - left, Decreased step length - right, Decreased stride length, Knee flexed in stance - right, Knee flexed in stance - left, Trunk flexed Gait velocity: decr     General Gait Details: slow effortful steps with audible crepitus from bil kness, knees and trunk flexed throughout, distance limited by fatigue   Stairs             Wheelchair Mobility     Tilt Bed    Modified Rankin (Stroke Patients Only)       Balance Overall balance assessment: Needs assistance Sitting-balance support: Feet supported Sitting balance-Leahy Scale: Fair Sitting balance - Comments: supervision sitting EOB   Standing balance support: Bilateral upper extremity supported, Reliant on assistive device for balance Standing balance-Leahy Scale: Poor Standing balance comment: heavy reliance on UE support                            Communication Communication Communication: No apparent difficulties  Cognition Arousal: Alert Behavior During Therapy: WFL for tasks assessed/performed   PT - Cognitive impairments: No apparent impairments  PT - Cognition Comments: pleasant, joking with therapist throughout session, motivated to get better/return to PLOF Following commands: Intact      Cueing Cueing Techniques: Verbal cues  Exercises      General Comments General comments (skin integrity,  edema, etc.): pt daughter present, OT arriving during session      Pertinent Vitals/Pain Pain Assessment Pain Assessment: Faces Faces Pain Scale: Hurts a little bit Pain Location: generalized discomfort when moving Pain Descriptors / Indicators: Discomfort, Sore Pain Intervention(s): Premedicated before session, Monitored during session, Limited activity within patient's tolerance    Home Living                          Prior Function            PT Goals (current goals can now be found in the care plan section) Acute Rehab PT Goals Patient Stated Goal: to return to prior level of function PT Goal Formulation: With patient Time For Goal Achievement: 08/17/24 Progress towards PT goals: Progressing toward goals    Frequency    Min 3X/week      PT Plan      Co-evaluation              AM-PAC PT 6 Clicks Mobility   Outcome Measure  Help needed turning from your back to your side while in a flat bed without using bedrails?: A Little Help needed moving from lying on your back to sitting on the side of a flat bed without using bedrails?: A Little Help needed moving to and from a bed to a chair (including a wheelchair)?: A Little Help needed standing up from a chair using your arms (e.g., wheelchair or bedside chair)?: A Little Help needed to walk in hospital room?: A Little Help needed climbing 3-5 steps with a railing? : Total 6 Click Score: 16    End of Session   Activity Tolerance: Patient tolerated treatment well Patient left: in chair;Other (comment) (on handoff to OT) Nurse Communication: Mobility status PT Visit Diagnosis: Other abnormalities of gait and mobility (R26.89);Muscle weakness (generalized) (M62.81);Pain Pain - part of body:  (abdomen)     Time: 8399-8381 PT Time Calculation (min) (ACUTE ONLY): 18 min  Charges:    $Gait Training: 8-22 mins PT General Charges $$ ACUTE PT VISIT: 1 Visit                     Bria Sparr R. PTA Acute  Rehabilitation Services Office: [phone]   Therisa CHRISTELLA Boor 08/11/2024, 4:28 PM

## 2024-08-11 NOTE — Plan of Care (Signed)
  Problem: Education: Goal: Knowledge of General Education information will improve Description: Including pain rating scale, medication(s)/side effects and non-pharmacologic comfort measures Outcome: Progressing   Problem: Clinical Measurements: Goal: Ability to maintain clinical measurements within normal limits will improve Outcome: Progressing Goal: Diagnostic test results will improve Outcome: Progressing Goal: Respiratory complications will improve Outcome: Progressing   Problem: Activity: Goal: Risk for activity intolerance will decrease Outcome: Progressing   Problem: Nutrition: Goal: Adequate nutrition will be maintained Outcome: Progressing   Problem: Elimination: Goal: Will not experience complications related to bowel motility Outcome: Progressing Goal: Will not experience complications related to urinary retention Outcome: Progressing

## 2024-08-12 NOTE — Progress Notes (Signed)
 10 Days Post-Op  Subjective: No abdominal pain, n/v. Tolerating po but not eating much. BM yesterday. Voiding. Mobilizing with PT.    Afebrile. No tachycardia or hypotension. No recent labs.   Objective: Vital signs in last 24 hours: Temp:  [97.9 F (36.6 C)-98.2 F (36.8 C)] 98.2 F (36.8 C) (08/14 0803) Pulse Rate:  [78-89] 89 (08/14 0803) Resp:  [16-18] 17 (08/14 0803) BP: (147-168)/(78-88) 168/82 (08/14 0803) SpO2:  [98 %-100 %] 99 % (08/14 0803) Last BM Date : 08/11/24  Intake/Output from previous day: 08/13 0701 - 08/14 0700 In: 3142.4 [P.O.:600; I.V.:2542.4] Out: 30 [Drains:30] Intake/Output this shift: No intake/output data recorded.  PE: Gen:  Alert, NAD, pleasant Pulm:  nonlabored respirations on room air Abd: Soft, nondistended. Vac to midline wound with good seal. JP drain site with dressing in place.  Psych: A&Ox3   Lab Results:  No results for input(s): WBC, HGB, HCT, PLT in the last 72 hours.  BMET No results for input(s): NA, K, CL, CO2, GLUCOSE, BUN, CREATININE, CALCIUM  in the last 72 hours.  PT/INR No results for input(s): LABPROT, INR in the last 72 hours. CMP     Component Value Date/Time   NA 138 08/05/2024 0310   NA 140 01/17/2020 1326   K 3.5 08/05/2024 0310   CL 102 08/05/2024 0310   CO2 24 08/05/2024 0310   GLUCOSE 92 08/05/2024 0310   BUN 8 08/05/2024 0310   BUN 19 01/17/2020 1326   CREATININE 0.79 08/09/2024 0421   CALCIUM  8.7 (L) 08/05/2024 0310   PROT 6.9 08/02/2024 0532   PROT 6.3 01/17/2020 1326   ALBUMIN  3.2 (L) 08/02/2024 0532   ALBUMIN  4.1 01/17/2020 1326   AST 18 08/02/2024 0532   ALT 15 08/02/2024 0532   ALKPHOS 82 08/02/2024 0532   BILITOT 0.5 08/02/2024 0532   BILITOT 0.4 01/17/2020 1326   GFRNONAA >60 08/09/2024 0421   GFRAA 104 01/17/2020 1326   Lipase     Component Value Date/Time   LIPASE 24 08/02/2024 0532    Studies/Results: No results  found.  Anti-infectives: Anti-infectives (From admission, onward)    Start     Dose/Rate Route Frequency Ordered Stop   08/02/24 1630  fluconazole  (DIFLUCAN ) IVPB 200 mg        200 mg 100 mL/hr over 60 Minutes Intravenous Every 24 hours 08/02/24 1541 08/07/24 1629   08/02/24 1600  piperacillin -tazobactam (ZOSYN ) IVPB 3.375 g        3.375 g 12.5 mL/hr over 240 Minutes Intravenous Every 8 hours 08/02/24 1541 08/07/24 1002   08/02/24 0615  piperacillin -tazobactam (ZOSYN ) IVPB 3.375 g        3.375 g 100 mL/hr over 30 Minutes Intravenous  Once 08/02/24 0604 08/02/24 0713        Assessment/Plan POD 10 s/p Exploratory laparotomy, Arlyss patch repair of perforated gastric ulcer by Dr. Dasie on 08/02/24 - Path with Inflammatory exudate consistent with origin from an ulcer. No mucosal tissue present.  - CT 8/8 neg for leak - Completed 5d post op Zosyn /Fluconazole   - JP drain out POD 9 - BID PPI - H pylori stool antigen neg - Multimodal pain control. Avoid NSAIDs - Mobilize, PT/OT - currently recommending CIR - Pulm toilet - Wound care: wound vac to midline wound, change M/Th - Home medications for HTN, HLD and MS    FEN - Soft, BID PPI, SLIV  VTE - SCDs, Lovenox  ID - Zosyn /Fluconazole  for 5 days postop completed. None currently.  Dispo - Medically stable for discharge to CIR. Awaiting CIR appeal.    LOS: 10 days    [name], [facility] 08/12/2024, 9:51 AM Please see Amion for pager number during day hours 7:00am-4:30pm

## 2024-08-12 NOTE — Consult Note (Addendum)
 WOC Nurse wound follow up Wound type: abd with full thickness midline surgical wound Measurement: slowly decreasing in size; 14 cm x 4 cm x .2 cm  Wound bed: 95% red, 5% yellow adipose tissue scattered throughout, Drainage (amount, consistency, odor) small amt serosanguinous in cannister Periwound: intact Dressing procedure/placement/frequency: Pt was medicated for pain prior to the procedure and tolerated with minimal amt discomfort.  Removed old NPWT dressing  Filled wound with   __1__ piece of black foam  Sealed NPWT dressing at HG Used half of a barrier ring at lower wound edges to attempt to maintain a seal.  Extra supplies at the bedside.  Secure chat sent to surgical team as follows, I just changed this Vac and posted a photo in the EMR. The wound is only .2 cm deep and I think the patient will not need it upon D/C. Thank-you for your consideration.    WOC nurse will plan to change the Vac again on Mon.   Thank-you, Stephane Fought MSN, RN, CWOCN, CWCN-AP, CNS Contact Mon-Fri 0700-1500: [phone]

## 2024-08-12 NOTE — Plan of Care (Signed)
  Problem: Clinical Measurements: Goal: Diagnostic test results will improve Outcome: Progressing   Problem: Activity: Goal: Risk for activity intolerance will decrease Outcome: Progressing   Problem: Coping: Goal: Level of anxiety will decrease Outcome: Not Progressing   Problem: Elimination: Goal: Will not experience complications related to bowel motility Outcome: Progressing Goal: Will not experience complications related to urinary retention Outcome: Progressing

## 2024-08-12 NOTE — Progress Notes (Signed)
 Inpatient Rehab Admissions Coordinator:   Appeal pending.  Will continue to follow.   Reche Lowers, PT, DPT Admissions Coordinator [phone] 08/12/24  10:16 AM

## 2024-08-12 NOTE — Plan of Care (Signed)
   Problem: Education: Goal: Knowledge of General Education information will improve Description Including pain rating scale, medication(s)/side effects and non-pharmacologic comfort measures Outcome: Progressing

## 2024-08-12 NOTE — Progress Notes (Signed)
 Physical Therapy Treatment Patient Details Name: [name] MRN: [id number] DOB: [birth year] Today's Date: 08/12/2024   History of Present Illness 63 y.o. female presents to [facility] hospital on 08/02/2024 with abdominal pain. Imaging with free air in abdomen. Pt underwent ex-lap with repair of perforated gastric ulcer on 8/4. PMH includes HTN, HLD, MS, OA.    PT Comments  Pt greeted asleep in bed, easily arose to verbal command. She was pleasant and agreeable to PT session. Pt ambulated within the room using RW with CGA-minA x2. She demonstrated a step-to pattern with bilateral knees maintained in flexion and excessive trunk flex. Multi-modal cueing for improved technique with no adjustments noted. Pt has decreased activity tolerance and quickly fatigues during functional mobility. She engaged in two seated BLE exercises while in recliner chair. Patient will benefit from continued inpatient follow up therapy, <3 hours/day.     If plan is discharge home, recommend the following: Assistance with cooking/housework;Assist for transportation;Help with stairs or ramp for entrance;A little help with walking and/or transfers;A little help with bathing/dressing/bathroom   Can travel by private vehicle     Yes  Equipment Recommendations  Wheelchair (measurements PT);Wheelchair cushion (measurements PT)    Recommendations for Other Services       Precautions / Restrictions Precautions Precautions: Fall Recall of Precautions/Restrictions: Intact Precaution/Restrictions Comments: Wound Vac Restrictions Weight Bearing Restrictions Per Provider Order: No     Mobility  Bed Mobility Overal bed mobility: Needs Assistance Bed Mobility: Rolling, Sidelying to Sit Rolling: Used rails, Contact guard assist Sidelying to sit: HOB elevated, Used rails, Contact guard assist       General bed mobility comments: Pt sat up on R side of bed with increased time. Cues for sequencing of log roll technique.  Assist to turn onto her side, elevate trunk, and scoot hips fwd.    Transfers Overall transfer level: Needs assistance Equipment used: Rolling walker (2 wheels) Transfers: Sit to/from Stand, Bed to chair/wheelchair/BSC Sit to Stand: Contact guard assist, Min assist, From elevated surface   Step pivot transfers: Contact guard assist       General transfer comment: Pt stood from raised bed height and commode. She demonstrated proper hand placement using RW. Powered up from elevated surface with CGA and lower surface with minA. Pt took increased time to achieve upright trunk. She maintained B knee in flex. Transferred to recliner chair on right. Good eccentric control.    Ambulation/Gait Ambulation/Gait assistance: Contact guard assist, Min assist, +2 safety/equipment (management of wound vac) Gait Distance (Feet): 20 Feet (1x10, seated rest, 1x20) Assistive device: Rolling walker (2 wheels) Gait Pattern/deviations: Step-to pattern, Knee flexed in stance - right, Knee flexed in stance - left, Decreased step length - right, Decreased step length - left, Trunk flexed Gait velocity: decreased     General Gait Details: Pt took short, slow, laborious steps. She maintained B knees in flex and trunk flex over RW. Pt demonstrated increased WBing through BUE support on RW to advance LEs. Her shoulder would elevate to the level of her ears. Cued upright posture, knee ext, and shoulder relaxation without change. Pt quickly fatigued and required increased assit for safe navigation. Initially had wound vac on RW, but pt was unable to manuever AD well; therefore, held off RW.   Stairs             Wheelchair Mobility     Tilt Bed    Modified Rankin (Stroke Patients Only)       Balance Overall  balance assessment: Needs assistance Sitting-balance support: Single extremity supported, No upper extremity supported, Feet supported Sitting balance-Leahy Scale: Fair Sitting balance - Comments:  Pt sat EOB with supervision   Standing balance support: Bilateral upper extremity supported, Single extremity supported, During functional activity, Reliant on assistive device for balance Standing balance-Leahy Scale: Poor Standing balance comment: Pt maintained static stance with unilateral UE support on RW and CGA in order to complete pericare with set-up assist. Pt dependent on RW.                            Communication Communication Communication: No apparent difficulties  Cognition Arousal: Lethargic, Alert Behavior During Therapy: WFL for tasks assessed/performed   PT - Cognitive impairments: No apparent impairments                       PT - Cognition Comments: Pt initially asleep. She was drowsy and reported being tired throughout session. Following commands: Intact      Cueing Cueing Techniques: Verbal cues  Exercises General Exercises - Lower Extremity Long Arc Quad: Seated, Both, 10 reps, AROM Hip Flexion/Marching: Seated, Both, AROM, 10 reps    General Comments        Pertinent Vitals/Pain Pain Assessment Pain Assessment: Faces Faces Pain Scale: Hurts little more Pain Location: B knees, Back Pain Descriptors / Indicators: Discomfort, Sore Pain Intervention(s): Monitored during session, Limited activity within patient's tolerance, Repositioned    Home Living                          Prior Function            PT Goals (current goals can now be found in the care plan section) Acute Rehab PT Goals Patient Stated Goal: Get stronger and move better Progress towards PT goals: Progressing toward goals    Frequency    Min 3X/week      PT Plan      Co-evaluation              AM-PAC PT 6 Clicks Mobility   Outcome Measure  Help needed turning from your back to your side while in a flat bed without using bedrails?: A Little Help needed moving from lying on your back to sitting on the side of a flat bed without  using bedrails?: A Little Help needed moving to and from a bed to a chair (including a wheelchair)?: A Little Help needed standing up from a chair using your arms (e.g., wheelchair or bedside chair)?: A Little Help needed to walk in hospital room?: A Little Help needed climbing 3-5 steps with a railing? : Total 6 Click Score: 16    End of Session Equipment Utilized During Treatment: Gait belt Activity Tolerance: Patient tolerated treatment well;Patient limited by fatigue Patient left: in chair;with call bell/phone within reach;with chair alarm set;with family/visitor present Nurse Communication: Mobility status PT Visit Diagnosis: Other abnormalities of gait and mobility (R26.89);Muscle weakness (generalized) (M62.81);Pain     Time: 8560-8541 PT Time Calculation (min) (ACUTE ONLY): 19 min  Charges:    $Therapeutic Activity: 8-22 mins PT General Charges $$ ACUTE PT VISIT: 1 Visit                     Randall SAUNDERS, PT, DPT Acute Rehabilitation Services Office: [phone] Secure Chat Preferred  [name] 08/12/2024, 4:03 PM

## 2024-08-13 NOTE — NC FL2 (Signed)
 [place]  MEDICAID FL2 LEVEL OF CARE FORM     IDENTIFICATION  Patient Name: [name] Birthdate: [birth year] Sex: female Admission Date (Current Location): 08/02/2024  [facility] and [place] Number:  Producer, television/film/video and Address:  [facility], [address], [place], [place] [address]      Provider Number: [id number]  Attending Physician Name and Address:  Kristopher, Md, MD  Relative Name and Phone Number:       Current Level of Care: Hospital Recommended Level of Care: Skilled Nursing Facility Prior Approval Number:    Date Approved/Denied:   PASRR Number: [id number]  Discharge Plan: SNF    Current Diagnoses: Patient Active Problem List   Diagnosis Date Noted   Perforated gastric ulcer (HCC) 08/02/2024   Perforated abdominal viscus 08/02/2024   Postoperative seroma of subcutaneous tissue after non-dermatologic procedure 04/11/2021   Wound drainage 02/21/2021   Wound dehiscence 02/21/2021   Postoperative seroma of musculoskeletal structure after musculoskeletal procedure 12/05/2020   Spondylolisthesis of lumbar region 11/02/2020   Insulin  resistance 02/15/2020   Class 3 severe obesity with serious comorbidity and body mass index (BMI) of 45.0 to 49.9 in adult 01/19/2020   Vitamin D  deficiency 01/18/2020   Other hyperlipidemia 02/22/2019   Primary osteoarthritis of both knees 10/14/2018   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   Essential hypertension 02/16/2016   Multiple sclerosis (HCC) 04/06/2013    Orientation RESPIRATION BLADDER Height & Weight     Self, Time, Situation, Place  Normal Continent Weight: 248 lb 14.4 oz (112.9 kg) Height:  5' 3 (160 cm)  BEHAVIORAL SYMPTOMS/MOOD NEUROLOGICAL BOWEL NUTRITION STATUS      Continent    AMBULATORY STATUS COMMUNICATION OF NEEDS Skin   Limited Assist   Surgical wounds                       Personal Care Assistance Level of Assistance  Bathing, Feeding, Dressing Bathing Assistance: Limited  assistance Feeding assistance: Limited assistance Dressing Assistance: Limited assistance     Functional Limitations Info  Sight, Hearing, Speech Sight Info: Impaired Hearing Info: Adequate Speech Info: Adequate    SPECIAL CARE FACTORS FREQUENCY  PT (By licensed PT), OT (By licensed OT)                    Contractures Contractures Info: Not present    Additional Factors Info  Code Status Code Status Info: FULL CODE             Current Medications (08/13/2024):  This is the current hospital active medication list Current Facility-Administered Medications  Medication Dose Route Frequency Provider Last Rate Last Admin   acetaminophen  (TYLENOL ) tablet 1,000 mg  1,000 mg Oral Q6H Maczis, Michael M, PA-C   1,000 mg at 08/13/24 9093   diphenhydrAMINE  (BENADRYL ) injection 12.5 mg  12.5 mg Intravenous Q6H PRN Dasie Leonor CROME, MD       enoxaparin  (LOVENOX ) injection 60 mg  60 mg Subcutaneous Q24H Maczis, Michael M, PA-C   60 mg at 08/12/24 1735   feeding supplement (ENSURE PLUS HIGH PROTEIN) liquid 237 mL  237 mL Oral BID BM Maczis, Michael M, PA-C   237 mL at 08/09/24 1451   hydrALAZINE  (APRESOLINE ) injection 5 mg  5 mg Intravenous Q6H PRN Azadegan, Maryam, MD       hydrochlorothiazide  (HYDRODIURIL ) tablet 25 mg  25 mg Oral q AM Maczis, Michael M, PA-C   25 mg at 08/13/24 9377  HYDROmorphone  (DILAUDID ) injection 0.5 mg  0.5 mg Intravenous Q4H PRN Dasie Best L, MD   0.5 mg at 08/05/24 1719   losartan  (COZAAR ) tablet 100 mg  100 mg Oral Daily Maczis, Michael M, PA-C   100 mg at 08/13/24 9093   methocarbamol  (ROBAXIN ) tablet 500 mg  500 mg Oral TID Chen, Lydia D, RPH   500 mg at 08/13/24 9093   ondansetron  (ZOFRAN ) injection 4 mg  4 mg Intravenous Q6H PRN Maczis, Michael M, PA-C       oxyCODONE  (Oxy IR/ROXICODONE ) immediate release tablet 5-10 mg  5-10 mg Oral Q4H PRN Maczis, Michael M, PA-C   5 mg at 08/13/24 0913   pantoprazole  (PROTONIX ) EC tablet 40 mg  40 mg Oral BID  Johnson, Kelly R, PA-C   40 mg at 08/13/24 9093   phenol (CHLORASEPTIC) mouth spray 1 spray  1 spray Mouth/Throat PRN Dasie Best CROME, MD       pneumococcal 20-valent conjugate vaccine (PREVNAR 20 ) injection 0.5 mL  0.5 mL Intramuscular Tomorrow-1000 Maczis, Michael M, PA-C       rosuvastatin  (CRESTOR ) tablet 10 mg  10 mg Oral QHS Maczis, Michael M, PA-C   10 mg at 08/12/24 2148   sodium chloride  flush (NS) 0.9 % injection 10-40 mL  10-40 mL Intracatheter PRN Dasie Best CROME, MD       Teriflunomide  TABS 14 mg  14 mg Oral QHS Maczis, Michael M, PA-C   14 mg at 08/12/24 2223     Discharge Medications: Please see discharge summary for a list of discharge medications.  Relevant Imaging Results:  Relevant Lab Results:   Additional Information SS# [id number]  Gwenn Julien Norris, [place]

## 2024-08-13 NOTE — Progress Notes (Signed)
 Inpatient Rehab Admissions Coordinator:    CIR following. Pt lost her appeal with insurance. I  notified TOC. Will need to seek other rehab venues.   Leita Kleine, MS, CCC-SLP Rehab Admissions Coordinator  [phone] (celll) [phone] (office)

## 2024-08-13 NOTE — Progress Notes (Signed)
 Occupational Therapy Treatment Patient Details Name: [name] MRN: [id number] DOB: 11/01/1961 Today's Date: 08/13/2024   History of present illness 63 y.o. female presents to [facility] hospital on 08/02/2024 with abdominal pain. Imaging with free air in abdomen. Pt underwent ex-lap with repair of perforated gastric ulcer on 8/4. PMH includes HTN, HLD, MS, OA.   OT comments  OT session focused on training in techniques for increased safety and independence with all steps of toileting tasks, dressing, and mobility during/in preparation for functional tasks. Pt currently demonstrates ability to complete bed mobility with Supervision, toileting clothing management/hygiene with Set up-Supervision, dressing with Set up to Mod assist, and functional mobility/transfers with a RW with close Supervision to Contact guard assist. Pt participated well in session and is making progress toward OT goals. Pt will benefit from continues acute skilled OT services. Pt insurance denied CIR. Due to this, OT recommendation updated this day to intensive inpatient skilled rehab services < 3 hours per day to maximize rehab potential.       If plan is discharge home, recommend the following:  A little help with walking and/or transfers;A lot of help with bathing/dressing/bathroom;Assistance with cooking/housework;Assist for transportation;Help with stairs or ramp for entrance   Equipment Recommendations  None recommended by OT (Pt already has needed equipment)    Recommendations for Other Services      Precautions / Restrictions Precautions Precautions: Fall Recall of Precautions/Restrictions: Intact Precaution/Restrictions Comments: Wound Vac Restrictions Weight Bearing Restrictions Per Provider Order: No       Mobility Bed Mobility Overal bed mobility: Needs Assistance Bed Mobility: Sidelying to Sit, Sit to Supine   Sidelying to sit: Supervision, HOB elevated, Used rails (with increased time and effort)    Sit to supine: Supervision, HOB elevated, Used rails   General bed mobility comments: Pt sat up on R side of bed with increased time. No cues needed this session for sequencing of log roll technique. No physical assist needed in sit to supine.    Transfers Overall transfer level: Needs assistance Equipment used: Rolling walker (2 wheels) Transfers: Sit to/from Stand, Bed to chair/wheelchair/BSC Sit to Stand: Contact guard assist     Step pivot transfers: Contact guard assist     General transfer comment: Pt stood from raised bed height and commode. She demonstrated proper hand placement using RW. Pt required increased time to achieve upright trunk. She maintained B knee in flex. Good eccentric control.     Balance Overall balance assessment: Needs assistance Sitting-balance support: Single extremity supported, No upper extremity supported, Feet supported Sitting balance-Leahy Scale: Fair Sitting balance - Comments: Pt sat EOB with supervision   Standing balance support: Single extremity supported, Bilateral upper extremity supported, During functional activity, Reliant on assistive device for balance Standing balance-Leahy Scale: Poor Standing balance comment: Pt maintained static stance with unilateral UE support on RW and CGA in order to complete pericare with Set-up-Supervision. Pt dependent on RW.                           ADL either performed or assessed with clinical judgement   ADL Overall ADL's : Needs assistance/impaired     Grooming: Wash/dry hands;Set up;Standing;Supervision/safety           Upper Body Dressing : Set up;Sitting (with increased time)   Lower Body Dressing: Minimal assistance;Moderate assistance;Sitting/lateral leans;Sit to/from stand   Toilet Transfer: Contact guard assist;Ambulation;Rolling walker (2 wheels);Regular Toilet;Grab bars   Toileting- Clothing Manipulation  and Hygiene: Supervision/safety;Set up;Sit to/from stand (use of  RW or grab bar for UE support during hygeine) Toileting - Clothing Manipulation Details (indicate cue type and reason): close Supervision for safety     Functional mobility during ADLs: Contact guard assist;Rolling walker (2 wheels) General ADL Comments: Pt with noted improvements in activity tolerance, but conitnuing to fatigue quickly during tasks.    Extremity/Trunk Assessment Upper Extremity Assessment Upper Extremity Assessment: Right hand dominant;Overall [facility] for tasks assessed   Lower Extremity Assessment Lower Extremity Assessment: Defer to PT evaluation        Vision       Perception     Praxis     Communication Communication Communication: No apparent difficulties   Cognition Arousal: Alert Behavior During Therapy: WFL for tasks assessed/performed Cognition: No apparent impairments             OT - Cognition Comments: Pt AAOx4 and pleasant throughout session.                 Following commands: Intact        Cueing   Cueing Techniques: Verbal cues  Exercises      Shoulder Instructions       General Comments VSS on RA. Daughter present and supportive throughout session.    Pertinent Vitals/ Pain       Pain Assessment Pain Assessment: 0-10 Pain Score: 5  Pain Location: B knees, Back Pain Descriptors / Indicators: Discomfort, Sore, Grimacing, Guarding Pain Intervention(s): Limited activity within patient's tolerance, Monitored during session, Premedicated before session, Repositioned  Home Living                                          Prior Functioning/Environment              Frequency  Min 2X/week        Progress Toward Goals  OT Goals(current goals can now be found in the care plan section)  Progress towards OT goals: Progressing toward goals  Acute Rehab OT Goals Patient Stated Goal: to continue to get better  Plan      Co-evaluation                 AM-PAC OT 6 Clicks Daily Activity      Outcome Measure   Help from another person eating meals?: None Help from another person taking care of personal grooming?: A Little Help from another person toileting, which includes using toliet, bedpan, or urinal?: A Little Help from another person bathing (including washing, rinsing, drying)?: A Lot Help from another person to put on and taking off regular upper body clothing?: A Little Help from another person to put on and taking off regular lower body clothing?: A Lot 6 Click Score: 17    End of Session Equipment Utilized During Treatment: Rolling walker (2 wheels)  OT Visit Diagnosis: Unsteadiness on feet (R26.81);Other abnormalities of gait and mobility (R26.89);Other (comment) (decreased activity tolerance)   Activity Tolerance Patient tolerated treatment well   Patient Left in bed;with call bell/phone within reach;with family/visitor present   Nurse Communication Mobility status        Time: 8491-8475 OT Time Calculation (min): 16 min  Charges: OT General Charges $OT Visit: 1 Visit OT Treatments $Self Care/Home Management : 8-22 mins  Margarie Rockey HERO., OTR/L, MA Acute Rehab [phone]   Margarie FORBES Horns 08/13/2024, 5:33 PM

## 2024-08-13 NOTE — TOC Progression Note (Signed)
 Transition of Care [facility]) - Progression Note    Patient Details  Name: [name] MRN: [id number] Date of Birth: 11/14/1961  Transition of Care [facility]) CM/SW Contact  Gwenn Frieze [place], [place] Phone Number: 08/13/2024, 12:21 PM  Clinical Narrative:  Pt's insurance has upheld denial for CIR after appeal. Spoke to [name] re SNF as alternative rehab placement. Explained SNF placement process and answered questions. Dtr agreeable to SNF and identifies [facility] or [facility] as preferences. Will begin SNF search and f/u with offers as available. Pt will require auth for SNF once facility chosen.   Frieze Gwenn, MSW, LCSW [phone] (coverage)      Expected Discharge Plan: IP Rehab Facility Barriers to Discharge: Continued Medical Work up               Expected Discharge Plan and Services   Discharge Planning Services: CM Consult Post Acute Care Choice: IP Rehab Living arrangements for the past 2 months: Single Family Home                 DME Arranged: N/A DME Agency: NA       HH Arranged:  (see note)           Social Drivers of Health (SDOH) Interventions SDOH Screenings   Food Insecurity: No Food Insecurity (08/02/2024)  Housing: Low Risk  (08/02/2024)  Transportation Needs: No Transportation Needs (08/02/2024)  Utilities: Not At Risk (08/02/2024)  Depression (PHQ2-9): Medium Risk (01/17/2020)  Financial Resource Strain: Low Risk  (04/01/2024)   Received from [facility]  Physical Activity: Unknown (04/01/2024)   Received from [facility]  Social Connections: Moderately Integrated (08/09/2024)  Stress: No Stress Concern Present (04/01/2024)   Received from [facility]  Tobacco Use: Low Risk  (08/02/2024)    Readmission Risk Interventions     No data to display

## 2024-08-13 NOTE — Progress Notes (Signed)
 11 Days Post-Op  Subjective: No abdominal pain, n/v. Tolerating soft diet. Passing flatus. Last BM 2 days ago. Voiding. Mobilizing with PT.   Afebrile. No tachycardia or hypotension. No recent labs.   Objective: Vital signs in last 24 hours: Temp:  [97.7 F (36.5 C)-98.6 F (37 C)] 98.6 F (37 C) (08/15 0743) Pulse Rate:  [96-99] 99 (08/15 0743) Resp:  [16-17] 16 (08/15 0743) BP: (122-153)/(74-89) 138/86 (08/15 0743) SpO2:  [97 %-100 %] 97 % (08/15 0743) Last BM Date : 08/11/24  Intake/Output from previous day: 08/14 0701 - 08/15 0700 In: 120 [P.O.:120] Out: -  Intake/Output this shift: No intake/output data recorded.  PE: Gen:  Alert, NAD, pleasant Pulm:  nonlabored respirations on room air Abd: Soft, ND, NT. Vac to midline wound with good seal. JP drain site with dressing in place.  Psych: A&Ox3   Lab Results:  No results for input(s): WBC, HGB, HCT, PLT in the last 72 hours.  BMET No results for input(s): NA, K, CL, CO2, GLUCOSE, BUN, CREATININE, CALCIUM  in the last 72 hours.  PT/INR No results for input(s): LABPROT, INR in the last 72 hours. CMP     Component Value Date/Time   NA 138 08/05/2024 0310   NA 140 01/17/2020 1326   K 3.5 08/05/2024 0310   CL 102 08/05/2024 0310   CO2 24 08/05/2024 0310   GLUCOSE 92 08/05/2024 0310   BUN 8 08/05/2024 0310   BUN 19 01/17/2020 1326   CREATININE 0.79 08/09/2024 0421   CALCIUM  8.7 (L) 08/05/2024 0310   PROT 6.9 08/02/2024 0532   PROT 6.3 01/17/2020 1326   ALBUMIN  3.2 (L) 08/02/2024 0532   ALBUMIN  4.1 01/17/2020 1326   AST 18 08/02/2024 0532   ALT 15 08/02/2024 0532   ALKPHOS 82 08/02/2024 0532   BILITOT 0.5 08/02/2024 0532   BILITOT 0.4 01/17/2020 1326   GFRNONAA >60 08/09/2024 0421   GFRAA 104 01/17/2020 1326   Lipase     Component Value Date/Time   LIPASE 24 08/02/2024 0532    Studies/Results: No results found.  Anti-infectives: Anti-infectives (From admission,  onward)    Start     Dose/Rate Route Frequency Ordered Stop   08/02/24 1630  fluconazole  (DIFLUCAN ) IVPB 200 mg        200 mg 100 mL/hr over 60 Minutes Intravenous Every 24 hours 08/02/24 1541 08/07/24 1629   08/02/24 1600  piperacillin -tazobactam (ZOSYN ) IVPB 3.375 g        3.375 g 12.5 mL/hr over 240 Minutes Intravenous Every 8 hours 08/02/24 1541 08/07/24 1002   08/02/24 0615  piperacillin -tazobactam (ZOSYN ) IVPB 3.375 g        3.375 g 100 mL/hr over 30 Minutes Intravenous  Once 08/02/24 0604 08/02/24 0713        Assessment/Plan POD 11 s/p Exploratory laparotomy, Arlyss patch repair of perforated gastric ulcer by Dr. Dasie on 08/02/24 - Path with Inflammatory exudate consistent with origin from an ulcer. No mucosal tissue present.  - CT 8/8 neg for leak - Completed 5d post op Zosyn /Fluconazole   - JP drain out POD 9 - BID PPI - H pylori stool antigen neg - Multimodal pain control. Avoid NSAIDs - Mobilize, PT/OT - Pulm toilet - Wound care: wound vac to midline wound, change M/Th - Home medications for HTN, HLD and MS    FEN - Soft, BID PPI, SLIV  VTE - SCDs, Lovenox  ID - Zosyn /Fluconazole  for 5 days postop completed. None currently.  Dispo -  CIR appeal denied. Discussed with PT who recommends SNF. Patient agreeable. Working towards SNF. Medically stable for discharge to SNF.    LOS: 11 days    [name], [facility] 08/13/2024, 9:29 AM Please see Amion for pager number during day hours 7:00am-4:30pm

## 2024-08-14 MED ORDER — PNEUMOCOCCAL 20-VAL CONJ VACC 0.5 ML IM SUSY
0.5000 mL | PREFILLED_SYRINGE | INTRAMUSCULAR | Status: AC
  Administered 2024-08-16: 0.5 mL via INTRAMUSCULAR
  Filled 2024-08-14 (×2): qty 0.5

## 2024-08-14 NOTE — Progress Notes (Signed)
 Mobility Specialist Progress Note:    08/14/24 1503  Mobility  Activity Mechanically lifted to/from [facility]  Level of Assistance Contact guard assist, steadying assist  Assistive Device Front wheel walker  Distance Ambulated (ft) 3 ft  Activity Response Tolerated well  Mobility Referral Yes  Mobility visit 1 Mobility  Mobility Specialist Start Time (ACUTE ONLY) 1503  Mobility Specialist Stop Time (ACUTE ONLY) 1510  Mobility Specialist Time Calculation (min) (ACUTE ONLY) 7 min   Pt requesting assistance to [facility] to use bathroom. No c/o any symptoms. Daughter in room to help clean. Returned pt to bed w/ all needs met.   Venetia Keel Mobility Specialist Please Neurosurgeon or Rehab Office at [phone]

## 2024-08-14 NOTE — TOC Progression Note (Addendum)
 Transition of Care [facility]) - Progression Note    Patient Details  Name: [name] MRN: [id number] Date of Birth: 03-07-1961  Transition of Care [facility]) CM/SW Contact  Neri Samek, [place], [place] Phone Number: 08/14/2024, 11:23 AM  Clinical Narrative:     Phone call to patient's daughter to provide bed offers. Bed offers provided, [facility] or [facility] remains preferred. {Patient's daughter will review additional bed offers provided. Confirmed that [facility] is not in network with her insurance. Spoke with [facility], they are considering bed offer. Facility would need to start authorization and would need confirmation regarding the wound vac.  TOC to continue to follow  Lloyd Ayo, LCSW [place]  [facility], [facility] Licensed Clinical Social Worker  Direct Dial: [phone]     Expected Discharge Plan: IP Rehab Facility Barriers to Discharge: Continued Medical Work up               Expected Discharge Plan and Services   Discharge Planning Services: CM Consult Post Acute Care Choice: IP Rehab Living arrangements for the past 2 months: Single Family Home                 DME Arranged: N/A DME Agency: NA       HH Arranged:  (see note)           Social Drivers of Health (SDOH) Interventions SDOH Screenings   Food Insecurity: No Food Insecurity (08/02/2024)  Housing: Low Risk  (08/02/2024)  Transportation Needs: No Transportation Needs (08/02/2024)  Utilities: Not At Risk (08/02/2024)  Depression (PHQ2-9): Medium Risk (01/17/2020)  Financial Resource Strain: Low Risk  (04/01/2024)   Received from [facility]  Physical Activity: Unknown (04/01/2024)   Received from [facility]  Social Connections: Moderately Integrated (08/09/2024)  Stress: No Stress Concern Present (04/01/2024)   Received from [facility]  Tobacco Use: Low Risk  (08/02/2024)    Readmission Risk Interventions     No data to display

## 2024-08-14 NOTE — Progress Notes (Signed)
    Assessment & Plan: POD#12 s/p Ex lap, Graham patch repair of perforated gastric ulcer - Dr. Dasie 08/02/24  - CT 8/8 neg for leak - Completed 5d post op Zosyn /Fluconazole   - JP drain out POD 9 - BID PPI - H pylori stool antigen neg - Multimodal pain control. Avoid NSAIDs - Mobilize, PT/OT - Pulm toilet - Wound care: wound vac to midline wound, change M/Th - Home medications for HTN, HLD and MS    FEN - Soft, BID PPI, SLIV  VTE - SCDs, Lovenox  ID - Zosyn /Fluconazole  for 5 days postop completed. None currently.  Dispo -  Surgically stable for discharge to SNF - pending per SW, CM.        Krystal Spinner, MD [facility] practice Office: [phone]        Chief Complaint: Perforated ulcer  Subjective: Patient in bed, tolerating soft diet.  Waiting for daughter to arrive.  Objective: Vital signs in last 24 hours: Temp:  [97.7 F (36.5 C)-98.2 F (36.8 C)] 98.2 F (36.8 C) (08/16 0743) Pulse Rate:  [87-99] 99 (08/16 0743) Resp:  [16-20] 20 (08/16 0743) BP: (121-149)/(79-93) 149/93 (08/16 0743) SpO2:  [96 %-100 %] 100 % (08/16 0743) Last BM Date : 08/11/24  Intake/Output from previous day: No intake/output data recorded. Intake/Output this shift: Total I/O In: -  Out: 100 [Urine:100]  Physical Exam: HEENT - sclerae clear, mucous membranes moist Abdomen - soft, non-tender; VAC dressing intact  Lab Results:  No results for input(s): WBC, HGB, HCT, PLT in the last 72 hours. BMET No results for input(s): NA, K, CL, CO2, GLUCOSE, BUN, CREATININE, CALCIUM  in the last 72 hours. PT/INR No results for input(s): LABPROT, INR in the last 72 hours. Comprehensive Metabolic Panel:    Component Value Date/Time   NA 138 08/05/2024 0310   NA 139 08/04/2024 0953   NA 140 01/17/2020 1326   K 3.5 08/05/2024 0310   K 3.4 (L) 08/04/2024 0953   CL 102 08/05/2024 0310   CL 102 08/04/2024 0953   CO2 24 08/05/2024 0310   CO2  26 08/04/2024 0953   BUN 8 08/05/2024 0310   BUN 9 08/04/2024 0953   BUN 19 01/17/2020 1326   CREATININE 0.79 08/09/2024 0421   CREATININE 0.73 08/05/2024 0310   GLUCOSE 92 08/05/2024 0310   GLUCOSE 89 08/04/2024 0953   CALCIUM  8.7 (L) 08/05/2024 0310   CALCIUM  8.7 (L) 08/04/2024 0953   AST 18 08/02/2024 0532   AST 22 01/17/2020 1326   ALT 15 08/02/2024 0532   ALT 16 01/17/2020 1326   ALKPHOS 82 08/02/2024 0532   ALKPHOS 68 01/17/2020 1326   BILITOT 0.5 08/02/2024 0532   BILITOT 0.4 01/17/2020 1326   PROT 6.9 08/02/2024 0532   PROT 6.3 01/17/2020 1326   ALBUMIN  3.2 (L) 08/02/2024 0532   ALBUMIN  4.1 01/17/2020 1326    Studies/Results: No results found.    Krystal Spinner 08/14/2024  Patient ID: [name], female   DOB: 1961/03/19, 63 y.o.   MRN: [id number]

## 2024-08-15 MED ORDER — BISACODYL 10 MG RE SUPP: 10.0000 mg | Freq: Every day | RECTAL | Status: DC | PRN

## 2024-08-15 MED ORDER — DOCUSATE SODIUM 100 MG PO CAPS
100.0000 mg | ORAL_CAPSULE | Freq: Two times a day (BID) | ORAL | Status: DC
  Administered 2024-08-15 – 2024-08-17 (×5): 100 mg via ORAL
  Filled 2024-08-15 (×6): qty 1

## 2024-08-15 NOTE — Progress Notes (Signed)
 Pt has no bowel movement since 3 days.

## 2024-08-15 NOTE — Progress Notes (Signed)
 Mobility Specialist Progress Note:    08/15/24 1445  Mobility  Activity Dangled on edge of bed;Pivoted/transferred to/from [facility]  Level of Assistance Minimal assist, patient does 75% or more  Assistive Device Front wheel walker  Distance Ambulated (ft) 3 ft  Activity Response Tolerated well  Mobility Referral Yes  Mobility visit 1 Mobility  Mobility Specialist Start Time (ACUTE ONLY) 1445  Mobility Specialist Stop Time (ACUTE ONLY) 1450  Mobility Specialist Time Calculation (min) (ACUTE ONLY) 5 min   Pt agreeable to session but only to use BSC. Complaint of fatigue. Pt allowed me to transfer her to [facility] but asked that nursing come assist w/ cleaning and other things. Left pt in room w/ all needs met and daughter in room. RN Notified  Venetia Keel Mobility Specialist Please Neurosurgeon or Rehab Office at [phone]

## 2024-08-15 NOTE — Progress Notes (Signed)
    Assessment & Plan: POD#13 s/p Ex lap, Graham patch repair of perforated gastric ulcer - Dr. Dasie 08/02/24  - BID PPI - Mobilize, PT/OT - add Colace, Dulcolax supp prn - Wound care: wound vac to midline wound, change M/Th - Home medications for HTN, HLD and MS    FEN - Soft, BID PPI, SLIV  VTE - SCDs, Lovenox  ID - Zosyn /Fluconazole  for 5 days postop completed. None currently.  Dispo -  Surgically stable for discharge to SNF - pending per SW, CM.        Krystal Spinner, MD [facility] practice Office: [phone]        Chief Complaint: Perforated gastric ulcer  Subjective: Patient in bed, comfortable.  No BM for two days.  Denies pain.  Objective: Vital signs in last 24 hours: Temp:  [97.9 F (36.6 C)-98.5 F (36.9 C)] 98.2 F (36.8 C) (08/17 0741) Pulse Rate:  [92-101] 97 (08/17 0741) Resp:  [16-18] 16 (08/17 0741) BP: (100-140)/(69-85) 140/85 (08/17 0741) SpO2:  [96 %-100 %] 100 % (08/17 0741) Last BM Date : 08/11/24  Intake/Output from previous day: 08/16 0701 - 08/17 0700 In: 1110 [P.O.:1110] Out: 425 [Urine:400; Drains:25] Intake/Output this shift: No intake/output data recorded.  Physical Exam: HEENT - sclerae clear, mucous membranes moist Abdomen - soft, non-tender; VAC intact  Lab Results:  No results for input(s): WBC, HGB, HCT, PLT in the last 72 hours. BMET No results for input(s): NA, K, CL, CO2, GLUCOSE, BUN, CREATININE, CALCIUM  in the last 72 hours. PT/INR No results for input(s): LABPROT, INR in the last 72 hours. Comprehensive Metabolic Panel:    Component Value Date/Time   NA 138 08/05/2024 0310   NA 139 08/04/2024 0953   NA 140 01/17/2020 1326   K 3.5 08/05/2024 0310   K 3.4 (L) 08/04/2024 0953   CL 102 08/05/2024 0310   CL 102 08/04/2024 0953   CO2 24 08/05/2024 0310   CO2 26 08/04/2024 0953   BUN 8 08/05/2024 0310   BUN 9 08/04/2024 0953   BUN 19 01/17/2020 1326   CREATININE  0.79 08/09/2024 0421   CREATININE 0.73 08/05/2024 0310   GLUCOSE 92 08/05/2024 0310   GLUCOSE 89 08/04/2024 0953   CALCIUM  8.7 (L) 08/05/2024 0310   CALCIUM  8.7 (L) 08/04/2024 0953   AST 18 08/02/2024 0532   AST 22 01/17/2020 1326   ALT 15 08/02/2024 0532   ALT 16 01/17/2020 1326   ALKPHOS 82 08/02/2024 0532   ALKPHOS 68 01/17/2020 1326   BILITOT 0.5 08/02/2024 0532   BILITOT 0.4 01/17/2020 1326   PROT 6.9 08/02/2024 0532   PROT 6.3 01/17/2020 1326   ALBUMIN  3.2 (L) 08/02/2024 0532   ALBUMIN  4.1 01/17/2020 1326    Studies/Results: No results found.    Krystal Spinner 08/15/2024  Patient ID: [name], female   DOB: 1961/06/25, 63 y.o.   MRN: [id number]

## 2024-08-15 NOTE — TOC Progression Note (Signed)
 Transition of Care [facility]) - Progression Note    Patient Details  Name: [name] MRN: [id number] Date of Birth: 05/15/1961  Transition of Care [facility]) CM/SW Contact  [address] [place], [place] Phone Number: 08/15/2024, 4:07 PM  Clinical Narrative:    Return call from patient's daughter Caysie Minnifield with bed choice. Prefers [facility] at this time.Confirmed that [facility] is considering bed offer, they will  need confirmation on status of wound vac and will need to obtain insurance authorization.  Dhamar Gregory, LCSW Transition of Care     Expected Discharge Plan: IP Rehab Facility Barriers to Discharge: Continued Medical Work up               Expected Discharge Plan and Services   Discharge Planning Services: CM Consult Post Acute Care Choice: IP Rehab Living arrangements for the past 2 months: Single Family Home                 DME Arranged: N/A DME Agency: NA       HH Arranged:  (see note)           Social Drivers of Health (SDOH) Interventions SDOH Screenings   Food Insecurity: No Food Insecurity (08/02/2024)  Housing: Low Risk  (08/02/2024)  Transportation Needs: No Transportation Needs (08/02/2024)  Utilities: Not At Risk (08/02/2024)  Depression (PHQ2-9): Medium Risk (01/17/2020)  Financial Resource Strain: Low Risk  (04/01/2024)   Received from [facility]  Physical Activity: Unknown (04/01/2024)   Received from [facility]  Social Connections: Moderately Integrated (08/09/2024)  Stress: No Stress Concern Present (04/01/2024)   Received from [name]  Tobacco Use: Low Risk  (08/02/2024)    Readmission Risk Interventions     No data to display

## 2024-08-16 NOTE — Consult Note (Signed)
 WOC Nurse wound follow up Surgical PA at the bedside to assess wound appearance.  Abd full thickness midline post-op wound is shallow and beefy red, Vac was d/ced. 12X4X.2cm, small amt pink drainage.  Topical treatment orders provided for bedside nurses to perform as follows: Apply Moist gauze dressing to abd wound Q day and cover with ABD pads and tape. Surgical team is following for assessment and plan of care.  WOC team will not plan to follow further.   Please re-consult if further assistance is needed.  Thank-you,  Stephane Fought MSN, RN, CWOCN, CWCN-AP, CNS Contact Mon-Fri 0700-1500: [phone]

## 2024-08-16 NOTE — Progress Notes (Addendum)
 14 Days Post-Op  Subjective: CC: Reports no abdominal pain, n/v. Tolerating diet. Passing flatus. No BM in 2d. Voiding. Mobilizing.   Objective: Vital signs in last 24 hours: Temp:  [98 F (36.7 C)] 98 F (36.7 C) (08/18 0400) Pulse Rate:  [88-98] 88 (08/18 0400) Resp:  [18] 18 (08/18 0400) BP: (119-126)/(78-80) 119/78 (08/18 0400) SpO2:  [95 %-98 %] 98 % (08/18 0400) Last BM Date : 08/11/24  Intake/Output from previous day: 08/17 0701 - 08/18 0700 In: 720 [P.O.:720] Out: -  Intake/Output this shift: No intake/output data recorded.  PE: Gen:  Alert, NAD, pleasant Card:  Reg Pulm:  Rate and effort normal Abd: Soft, ND, NT, midline wound clean as noted below. JP drain site with dressing in place, cdi Psych: A&Ox3     Lab Results:  No results for input(s): WBC, HGB, HCT, PLT in the last 72 hours. BMET No results for input(s): NA, K, CL, CO2, GLUCOSE, BUN, CREATININE, CALCIUM  in the last 72 hours. PT/INR No results for input(s): LABPROT, INR in the last 72 hours. CMP     Component Value Date/Time   NA 138 08/05/2024 0310   NA 140 01/17/2020 1326   K 3.5 08/05/2024 0310   CL 102 08/05/2024 0310   CO2 24 08/05/2024 0310   GLUCOSE 92 08/05/2024 0310   BUN 8 08/05/2024 0310   BUN 19 01/17/2020 1326   CREATININE 0.79 08/09/2024 0421   CALCIUM  8.7 (L) 08/05/2024 0310   PROT 6.9 08/02/2024 0532   PROT 6.3 01/17/2020 1326   ALBUMIN  3.2 (L) 08/02/2024 0532   ALBUMIN  4.1 01/17/2020 1326   AST 18 08/02/2024 0532   ALT 15 08/02/2024 0532   ALKPHOS 82 08/02/2024 0532   BILITOT 0.5 08/02/2024 0532   BILITOT 0.4 01/17/2020 1326   GFRNONAA >60 08/09/2024 0421   GFRAA 104 01/17/2020 1326   Lipase     Component Value Date/Time   LIPASE 24 08/02/2024 0532    Studies/Results: No results found.  Anti-infectives: Anti-infectives (From admission, onward)    Start     Dose/Rate Route Frequency Ordered Stop   08/02/24 1630  fluconazole   (DIFLUCAN ) IVPB 200 mg        200 mg 100 mL/hr over 60 Minutes Intravenous Every 24 hours 08/02/24 1541 08/07/24 1629   08/02/24 1600  piperacillin -tazobactam (ZOSYN ) IVPB 3.375 g        3.375 g 12.5 mL/hr over 240 Minutes Intravenous Every 8 hours 08/02/24 1541 08/07/24 1002   08/02/24 0615  piperacillin -tazobactam (ZOSYN ) IVPB 3.375 g        3.375 g 100 mL/hr over 30 Minutes Intravenous  Once 08/02/24 0604 08/02/24 9286        Assessment/Plan POD#14 s/p Ex lap, Graham patch repair of perforated gastric ulcer - Dr. Dasie 08/02/24  - Path with Inflammatory exudate consistent with origin from an ulcer. No mucosal tissue present.  - CT 8/8 neg for leak - Completed 5d post op Zosyn /Fluconazole   - JP drain out POD 9 - BID PPI - H pylori stool antigen neg - Multimodal pain control. Avoid NSAIDs - Mobilize, PT/OT - Pulm toilet - D/c VAC to midline wound. Transition to BID WTD - Home medications for HTN, HLD and MS    FEN - Soft, BID PPI, SLIV  VTE - SCDs, Lovenox  ID - Zosyn /Fluconazole  for 5 days postop completed. None currently.  Dispo -  Surgically stable for discharge to SNF - pending per SW, CM.   LOS: 14  days    [name] Shaper, [facility] 08/16/2024, 8:17 AM Please see Amion for pager number during day hours 7:00am-4:30pm

## 2024-08-16 NOTE — Progress Notes (Signed)
 Physical Therapy Treatment Patient Details Name: [name] MRN: [id number] DOB: [birth year] Today's Date: 08/16/2024   History of Present Illness 63 y.o. female presents to [facility] hospital on 08/02/2024 with abdominal pain. Imaging with free air in abdomen. Pt underwent ex-lap with repair of perforated gastric ulcer on 8/4. PMH includes HTN, HLD, MS, OA.    PT Comments  Pt up in the recliner chair on arrival, agreeable to session and demonstrating continued progress towards acute goals. Pt demonstrating transfers with CGA for safety with increased time needed to complete but no physical assist needed. Pt ambulating x2 bouts during session with continued flexed posture and knee flexion with cues for increased weight bearing through Ues with pt unable to correct. Pt continues to fatigue quickly with decreased activity tolerance, worked on self pacing with no chair follow utilized this session. Pt up in chair at end of session. Current plan remains appropriate to address deficits and maximize functional independence and decrease caregiver burden. Pt continues to benefit from skilled PT services to progress toward functional mobility goals.      If plan is discharge home, recommend the following: Assistance with cooking/housework;Assist for transportation;Help with stairs or ramp for entrance;A little help with walking and/or transfers;A little help with bathing/dressing/bathroom   Can travel by private vehicle     Yes  Equipment Recommendations  Wheelchair (measurements PT);Wheelchair cushion (measurements PT)    Recommendations for Other Services Rehab consult     Precautions / Restrictions Precautions Precautions: Fall Recall of Precautions/Restrictions: Intact Restrictions Weight Bearing Restrictions Per Provider Order: No     Mobility  Bed Mobility Overal bed mobility: Needs Assistance             General bed mobility comments: pt up in chair on arrival     Transfers Overall transfer level: Needs assistance Equipment used: Rolling walker (2 wheels) Transfers: Sit to/from Stand, Bed to chair/wheelchair/BSC Sit to Stand: Contact guard assist           General transfer comment: pt standing from chair and low commode with incrreased time but no assist needed, knees flexed throughout    Ambulation/Gait Ambulation/Gait assistance: Contact guard assist Gait Distance (Feet): 15 Feet (+ 35) Assistive device: Rolling walker (2 wheels) Gait Pattern/deviations: Step-to pattern, Knee flexed in stance - right, Knee flexed in stance - left, Decreased step length - right, Decreased step length - left, Trunk flexed Gait velocity: decreased     General Gait Details: Pt took short, slow, laborious steps. She maintained B knees in flex and trunk flex over RW. Pt demonstrated increased WBing through BUE support on RW to advance LEs. Her shoulder would elevate to the level of her ears. Cued upright posture, knee ext, and shoulder relaxation without change.   Stairs             Wheelchair Mobility     Tilt Bed    Modified Rankin (Stroke Patients Only)       Balance Overall balance assessment: Needs assistance Sitting-balance support: Single extremity supported, No upper extremity supported, Feet supported Sitting balance-Leahy Scale: Fair Sitting balance - Comments: Pt sat EOB with supervision   Standing balance support: Single extremity supported, Bilateral upper extremity supported, During functional activity, Reliant on assistive device for balance Standing balance-Leahy Scale: Poor Standing balance comment: Pt maintained static stance with unilateral UE support on RW and CGA in order to complete pericare with Set-up-Supervision. Pt dependent on RW.  Communication Communication Communication: No apparent difficulties  Cognition Arousal: Alert Behavior During Therapy: WFL for tasks  assessed/performed   PT - Cognitive impairments: No apparent impairments                         Following commands: Intact      Cueing Cueing Techniques: Verbal cues  Exercises Other Exercises Other Exercises: reviewed exercises to complete between therapies    General Comments General comments (skin integrity, edema, etc.): VSS on RA. Daughter present and supportive throughout session.      Pertinent Vitals/Pain Pain Assessment Pain Assessment: Faces Faces Pain Scale: Hurts little more Pain Location: abdomen, Bknees Pain Descriptors / Indicators: Discomfort, Sore, Grimacing, Guarding Pain Intervention(s): Monitored during session, Limited activity within patient's tolerance, Premedicated before session    Home Living                          Prior Function            PT Goals (current goals can now be found in the care plan section) Acute Rehab PT Goals Patient Stated Goal: Get stronger and move better PT Goal Formulation: With patient Time For Goal Achievement: 08/17/24 Progress towards PT goals: Progressing toward goals    Frequency    Min 3X/week      PT Plan      Co-evaluation              AM-PAC PT 6 Clicks Mobility   Outcome Measure  Help needed turning from your back to your side while in a flat bed without using bedrails?: A Little Help needed moving from lying on your back to sitting on the side of a flat bed without using bedrails?: A Little Help needed moving to and from a bed to a chair (including a wheelchair)?: A Little Help needed standing up from a chair using your arms (e.g., wheelchair or bedside chair)?: A Little Help needed to walk in hospital room?: A Little Help needed climbing 3-5 steps with a railing? : Total 6 Click Score: 16    End of Session   Activity Tolerance: Patient tolerated treatment well;Patient limited by fatigue Patient left: in chair;with call bell/phone within reach;with  family/visitor present Nurse Communication: Mobility status PT Visit Diagnosis: Other abnormalities of gait and mobility (R26.89);Muscle weakness (generalized) (M62.81);Pain Pain - part of body:  (abdomen)     Time: 8450-8391 PT Time Calculation (min) (ACUTE ONLY): 19 min  Charges:    $Gait Training: 8-22 mins PT General Charges $$ ACUTE PT VISIT: 1 Visit                     Rilea Arutyunyan R. PTA Acute Rehabilitation Services Office: [phone]   Therisa CHRISTELLA Boor 08/16/2024, 4:14 PM

## 2024-08-17 MED ORDER — METHOCARBAMOL 500 MG PO TABS
500.0000 mg | ORAL_TABLET | Freq: Three times a day (TID) | ORAL | Status: AC
Start: 2024-08-17 — End: ?

## 2024-08-17 MED ORDER — POLYETHYLENE GLYCOL 3350 17 G PO PACK
17.0000 g | PACK | Freq: Every day | ORAL | Status: DC
  Administered 2024-08-17: 17 g via ORAL

## 2024-08-17 MED ORDER — POLYETHYLENE GLYCOL 3350 17 G PO PACK: 17.0000 g | PACK | Freq: Two times a day (BID) | ORAL | Status: DC

## 2024-08-17 MED ORDER — PANTOPRAZOLE SODIUM 40 MG PO TBEC
40.0000 mg | DELAYED_RELEASE_TABLET | Freq: Two times a day (BID) | ORAL | Status: AC
Start: 2024-08-17 — End: ?

## 2024-08-17 MED ORDER — DOCUSATE SODIUM 100 MG PO CAPS: 100.0000 mg | ORAL_CAPSULE | Freq: Two times a day (BID) | ORAL | Status: AC

## 2024-08-17 MED ORDER — ENSURE PLUS HIGH PROTEIN PO LIQD: 237.0000 mL | Freq: Two times a day (BID) | ORAL | Status: AC

## 2024-08-17 MED ORDER — OXYCODONE HCL 5 MG PO TABS: 5.0000 mg | ORAL_TABLET | Freq: Four times a day (QID) | ORAL | 0 refills | Status: DC | PRN

## 2024-08-17 MED ORDER — POLYETHYLENE GLYCOL 3350 17 G PO PACK: 17.0000 g | PACK | Freq: Two times a day (BID) | ORAL | Status: AC | PRN

## 2024-08-17 NOTE — Progress Notes (Signed)
 Discharge instructions given to pt and pt daughter. Both verbalized understanding of all teaching and had no further questions. Home med Teriflunomide  picked up from pharmacy and returned to pt at discharge. Printed oxy prescription given at discharge.

## 2024-08-17 NOTE — TOC Progression Note (Signed)
 Transition of Care [facility]) - Progression Note    Patient Details  Name: [name] MRN: [id number] Date of Birth: [birth year]  Transition of Care [facility]) CM/SW Contact  [name], [place] Phone Number: 08/17/2024, 1:38 PM  Clinical Narrative:    CSW spoke with [place] from [facility], they have received auth approval for pt. [place] asked if pt was medically ready to DC today and if so if the DC summary could be in before 3PM. CSW messaged PA. CSW will continue to follow.   Expected Discharge Plan: IP Rehab Facility Barriers to Discharge: Continued Medical Work up               Expected Discharge Plan and Services   Discharge Planning Services: CM Consult Post Acute Care Choice: IP Rehab Living arrangements for the past 2 months: Single Family Home                 DME Arranged: N/A DME Agency: NA       HH Arranged:  (see note)           Social Drivers of Health (SDOH) Interventions SDOH Screenings   Food Insecurity: No Food Insecurity (08/02/2024)  Housing: Low Risk  (08/02/2024)  Transportation Needs: No Transportation Needs (08/02/2024)  Utilities: Not At Risk (08/02/2024)  Depression (PHQ2-9): Medium Risk (01/17/2020)  Financial Resource Strain: Low Risk  (04/01/2024)   Received from [facility]  Physical Activity: Unknown (04/01/2024)   Received from [facility]  Social Connections: Moderately Integrated (08/09/2024)  Stress: No Stress Concern Present (04/01/2024)   Received from [facility]  Tobacco Use: Low Risk  (08/02/2024)    Readmission Risk Interventions     No data to display

## 2024-08-17 NOTE — Progress Notes (Signed)
 Pt discharged to facility with daughter via wheelchair with all belongings, paperwork. Home meds, and prescriptions

## 2024-08-17 NOTE — Progress Notes (Signed)
 Attempted to call [facility] [phone] to give them report on pt x4, transferred to nurse biut no answer. Will try again later.

## 2024-08-17 NOTE — Progress Notes (Signed)
 RN completed discharge; took patient to entrance A to meet family.

## 2024-08-17 NOTE — Plan of Care (Signed)
  Problem: Education: Goal: Knowledge of General Education information will improve Description: Including pain rating scale, medication(s)/side effects and non-pharmacologic comfort measures Outcome: Progressing   Problem: Health Behavior/Discharge Planning: Goal: Ability to manage health-related needs will improve Outcome: Progressing   Problem: Activity: Goal: Risk for activity intolerance will decrease Outcome: Progressing   Problem: Pain Managment: Goal: General experience of comfort will improve and/or be controlled Outcome: Progressing   Problem: Safety: Goal: Ability to remain free from injury will improve Outcome: Progressing   Problem: Skin Integrity: Goal: Risk for impaired skin integrity will decrease Outcome: Progressing

## 2024-08-17 NOTE — Discharge Instructions (Signed)
 [facility]      [facility], [place] [phone]  OPEN ABDOMINAL SURGERY: POST OP INSTRUCTIONS  Always review your discharge instruction sheet given to you by the facility where your surgery was performed.  IF YOU HAVE DISABILITY OR FAMILY LEAVE FORMS, YOU MUST BRING THEM TO THE OFFICE FOR PROCESSING.  PLEASE DO NOT GIVE THEM TO YOUR DOCTOR.  A prescription for pain medication may be given to you upon discharge.  Take your pain medication as prescribed, if needed.  If narcotic pain medicine is not needed, then you may take acetaminophen  (Tylenol ) or ibuprofen (Advil) as needed. Take your usually prescribed medications unless otherwise directed. If you need a refill on your pain medication, please contact your pharmacy. They will contact our office to request authorization.  Prescriptions will not be filled after 5pm or on week-ends. You should follow a light diet the first few days after arrival home, such as soup and crackers, pudding, etc.unless your doctor has advised otherwise. A high-fiber, low fat diet can be resumed as tolerated.   Be sure to include lots of fluids daily. Most patients will experience some swelling and bruising on the chest and neck area.  Ice packs will help.  Swelling and bruising can take several days to resolve Most patients will experience some swelling and bruising in the area of the incision. Ice pack will help. Swelling and bruising can take several days to resolve..  It is common to experience some constipation if taking pain medication after surgery.  Increasing fluid intake and taking a stool softener will usually help or prevent this problem from occurring.  A mild laxative (Milk of Magnesia or Miralax) should be taken according to package directions if there are no bowel movements after 48 hours.  You may have steri-strips (small skin tapes) in place directly over the incision.  These strips should be left on the skin for 7-10 days.  If your surgeon used skin  glue on the incision, you may shower in 24 hours.  The glue will flake off over the next 2-3 weeks.  Any sutures or staples will be removed at the office during your follow-up visit. You may find that a light gauze bandage over your incision may keep your staples from being rubbed or pulled. You may shower and replace the bandage daily. ACTIVITIES:  You may resume regular (light) daily activities beginning the next day--such as daily self-care, walking, climbing stairs--gradually increasing activities as tolerated.  You may have sexual intercourse when it is comfortable.  Refrain from any heavy lifting or straining until approved by your doctor. You may drive when you no longer are taking prescription pain medication, you can comfortably wear a seatbelt, and you can safely maneuver your car and apply brakes Return to Work: ___________________________________ Rosine should see your doctor in the office for a follow-up appointment approximately two weeks after your surgery.  Make sure that you call for this appointment within a day or two after you arrive home to insure a convenient appointment time. OTHER INSTRUCTIONS:  _____________________________________________________________ _____________________________________________________________  WHEN TO CALL YOUR DOCTOR: Fever over 101.0 Inability to urinate Nausea and/or vomiting Extreme swelling or bruising Continued bleeding from incision. Increased pain, redness, or drainage from the incision. Difficulty swallowing or breathing Muscle cramping or spasms. Numbness or tingling in hands or feet or around lips.  The clinic staff is available to answer your questions during regular business hours.  Please don't hesitate to call and ask to speak to one of  the nurses if you have concerns.  For further questions, please visit [email or web address]

## 2024-08-17 NOTE — TOC Progression Note (Deleted)
 Transition of Care [facility]) - Progression Note    Patient Details  Name: [name] MRN: [id number] Date of Birth: 1961-12-01  Transition of Care [facility]) CM/SW Contact  Demorio Seeley LITTIE Moose, [place] Phone Number: 08/17/2024, 1:21 PM  Clinical Narrative:    CSW spoke with pt about SNF choice. Pt stated she had a few more facilities she wanted CSW to see if she could go to [facility], [facility], [facility], and [facility]) CSW sent referral to [facility] in the hub and emailed a referral to [facility], just awaiting a response. CSW will continue to follow.   Expected Discharge Plan: IP Rehab Facility Barriers to Discharge: Continued Medical Work up               Expected Discharge Plan and Services   Discharge Planning Services: CM Consult Post Acute Care Choice: IP Rehab Living arrangements for the past 2 months: Single Family Home                 DME Arranged: N/A DME Agency: NA       HH Arranged:  (see note)           Social Drivers of Health (SDOH) Interventions SDOH Screenings   Food Insecurity: No Food Insecurity (08/02/2024)  Housing: Low Risk  (08/02/2024)  Transportation Needs: No Transportation Needs (08/02/2024)  Utilities: Not At Risk (08/02/2024)  Depression (PHQ2-9): Medium Risk (01/17/2020)  Financial Resource Strain: Low Risk  (04/01/2024)   Received from [facility]  Physical Activity: Unknown (04/01/2024)   Received from [facility]  Social Connections: Moderately Integrated (08/09/2024)  Stress: No Stress Concern Present (04/01/2024)   Received from [facility]  Tobacco Use: Low Risk  (08/02/2024)    Readmission Risk Interventions     No data to display

## 2024-08-17 NOTE — TOC Transition Note (Signed)
 Transition of Care [facility]) - Discharge Note   Patient Details  Name: [name] MRN: [id number] Date of Birth: Oct 07, 1961  Transition of Care [facility]) CM/SW Contact:  [name] Phone Number: 08/17/2024, 2:48 PM   Clinical Narrative:    Patient will DC to: [facility] Anticipated DC date: 08/17/24 Family notified: Yes Transport by: private vehicle (daughter to transport her to facility)   Per MD patient ready for DC to [facility]. RN to call report prior to discharge [phone] Room 401. RN, patient, patient's family, and facility notified of DC. Discharge Summary and FL2 sent to facility. Daughter will transport pt to facility by private vehicle.   CSW will sign off for now as social work intervention is no longer needed. Please consult us  again if new needs arise.     Final next level of care: Skilled Nursing Facility Barriers to Discharge: Barriers Resolved   Patient Goals and CMS Choice Patient states their goals for this hospitalization and ongoing recovery are:: SNF CMS Medicare.gov Compare Post Acute Care list provided to:: Patient Choice offered to / list presented to : Patient      Discharge Placement   Existing PASRR number confirmed : 08/17/24          Patient chooses bed at: [facility] Patient to be transferred to facility by: Private vehicle (Daughter will transport her) Name of family member notified: [name] Patient and family notified of of transfer: 08/17/24  Discharge Plan and Services Additional resources added to the After Visit Summary for     Discharge Planning Services: CM Consult Post Acute Care Choice: IP Rehab          DME Arranged: N/A DME Agency: NA       HH Arranged:  (see note)          Social Drivers of Health (SDOH) Interventions SDOH Screenings   Food Insecurity: No Food Insecurity (08/02/2024)  Housing: Low Risk  (08/02/2024)  Transportation Needs: No Transportation Needs (08/02/2024)  Utilities: Not At Risk  (08/02/2024)  Depression (PHQ2-9): Medium Risk (01/17/2020)  Financial Resource Strain: Low Risk  (04/01/2024)   Received from [facility]  Physical Activity: Unknown (04/01/2024)   Received from [facility]  Social Connections: Moderately Integrated (08/09/2024)  Stress: No Stress Concern Present (04/01/2024)   Received from [facility]  Tobacco Use: Low Risk  (08/02/2024)     Readmission Risk Interventions     No data to display

## 2024-08-17 NOTE — Progress Notes (Signed)
 Called Report to [facility] [phone] , spoke with nurse Ellouise who will be taking care of patient in room 401 when she arrives. Nurse verbalized understanding of report and had no further questions. Patients daughter will be driving her to the facility at discharge

## 2024-08-17 NOTE — Progress Notes (Signed)
 Occupational Therapy Treatment Patient Details Name: [name] MRN: [id number] DOB: 07-20-1961 Today's Date: 08/17/2024   History of present illness 63 y.o. female presents to [facility] hospital on 08/02/2024 with abdominal pain. Imaging with free air in abdomen. Pt underwent ex-lap with repair of perforated gastric ulcer on 8/4. PMH includes HTN, HLD, MS, OA.   OT comments  Pt making good progress with functional goals. Wound VAC now removed. Pt eager to d/c to post acute rehab setting. OT will continue to follow acutely to maximize level of function and safety      If plan is discharge home, recommend the following:  A little help with walking and/or transfers;A lot of help with bathing/dressing/bathroom;Assistance with cooking/housework;Assist for transportation;Help with stairs or ramp for entrance   Equipment Recommendations  None recommended by OT    Recommendations for Other Services      Precautions / Restrictions Precautions Precautions: Fall Recall of Precautions/Restrictions: Intact Precaution/Restrictions Comments: wound VAC now out Restrictions Weight Bearing Restrictions Per Provider Order: No       Mobility Bed Mobility Overal bed mobility: Needs Assistance Bed Mobility: Sidelying to Sit, Sit to Supine   Sidelying to sit: Supervision, HOB elevated, Used rails            Transfers Overall transfer level: Needs assistance Equipment used: Rolling walker (2 wheels) Transfers: Sit to/from Stand, Bed to chair/wheelchair/BSC Sit to Stand: Contact guard assist     Step pivot transfers: Contact guard assist           Balance Overall balance assessment: Needs assistance Sitting-balance support: Single extremity supported, No upper extremity supported, Feet supported Sitting balance-Leahy Scale: Fair     Standing balance support: Single extremity supported, Bilateral upper extremity supported, During functional activity, Reliant on assistive device for  balance Standing balance-Leahy Scale: Poor                             ADL either performed or assessed with clinical judgement   ADL Overall ADL's : Needs assistance/impaired     Grooming: Wash/dry hands;Wash/dry face;Contact guard assist;Standing       Lower Body Bathing: Moderate assistance;Sit to/from stand           Toilet Transfer: Contact guard assist;Ambulation;Rolling walker (2 wheels);Regular Toilet;Grab bars   Toileting- Clothing Manipulation and Hygiene: Contact guard assist;Supervision/safety;Sitting/lateral lean;Sit to/from stand       Functional mobility during ADLs: Contact guard assist;Rolling walker (2 wheels) General ADL Comments: Pt with noted improvements in activity tolerance, but conitnuing to fatigue quickly during tasks.    Extremity/Trunk Assessment Upper Extremity Assessment Upper Extremity Assessment: Generalized weakness;Right hand dominant   Lower Extremity Assessment Lower Extremity Assessment: Defer to PT evaluation   Cervical / Trunk Assessment Cervical / Trunk Assessment: Other exceptions Cervical / Trunk Exceptions: abdominal surgical incision    Vision Baseline Vision/History: 1 Wears glasses Patient Visual Report: No change from baseline     Perception     Praxis     Communication Communication Communication: No apparent difficulties   Cognition Arousal: Alert   Cognition: No apparent impairments                               Following commands: Intact        Cueing   Cueing Techniques: Verbal cues  Exercises      Shoulder Instructions  General Comments      Pertinent Vitals/ Pain       Pain Assessment Pain Assessment: Faces Faces Pain Scale: Hurts a little bit Pain Location: abdomen, Bknees Pain Descriptors / Indicators: Discomfort, Sore, Grimacing, Guarding Pain Intervention(s): Monitored during session, Repositioned, Limited activity within patient's tolerance  Home  Living                                          Prior Functioning/Environment              Frequency  Min 2X/week        Progress Toward Goals  OT Goals(current goals can now be found in the care plan section)  Progress towards OT goals: Progressing toward goals     Plan      Co-evaluation                 AM-PAC OT 6 Clicks Daily Activity     Outcome Measure   Help from another person eating meals?: None Help from another person taking care of personal grooming?: A Little Help from another person toileting, which includes using toliet, bedpan, or urinal?: A Little Help from another person bathing (including washing, rinsing, drying)?: A Lot Help from another person to put on and taking off regular upper body clothing?: A Little Help from another person to put on and taking off regular lower body clothing?: A Lot 6 Click Score: 17    End of Session Equipment Utilized During Treatment: Rolling walker (2 wheels);Gait belt  OT Visit Diagnosis: Unsteadiness on feet (R26.81);Other abnormalities of gait and mobility (R26.89);Other (comment)   Activity Tolerance Patient tolerated treatment well   Patient Left in bed;with call bell/phone within reach;with family/visitor present;Other (comment) (sitting EOB)   Nurse Communication Mobility status        Time: 8770-8691 OT Time Calculation (min): 39 min  Charges: OT General Charges $OT Visit: 1 Visit OT Treatments $Self Care/Home Management : 23-37 mins $Therapeutic Activity: 8-22 mins   Jacques Karna Loose 08/17/2024, 2:06 PM

## 2024-08-17 NOTE — Discharge Summary (Signed)
 [facility] Discharge Summary   Patient ID: [name] MRN: [id number] DOB/AGE: [birth year] 63 y.o.  Admit date: 08/02/2024 Discharge date: 08/17/2024  Admitting Diagnosis: Perforated gastric ulcer  Discharge Diagnosis Patient Active Problem List   Diagnosis Date Noted   Perforated gastric ulcer (HCC) 08/02/2024   Perforated abdominal viscus 08/02/2024   Postoperative seroma of subcutaneous tissue after non-dermatologic procedure 04/11/2021   Wound drainage 02/21/2021   Wound dehiscence 02/21/2021   Postoperative seroma of musculoskeletal structure after musculoskeletal procedure 12/05/2020   Spondylolisthesis of lumbar region 11/02/2020   Insulin  resistance 02/15/2020   Class 3 severe obesity with serious comorbidity and body mass index (BMI) of 45.0 to 49.9 in adult 01/19/2020   Vitamin D  deficiency 01/18/2020   Other hyperlipidemia 02/22/2019   Primary osteoarthritis of both knees 10/14/2018   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   Essential hypertension 02/16/2016   Multiple sclerosis (HCC) 04/06/2013    Consultants WOC RN  Internal medicine - for management of chronic HTN, HLD, MS   Imaging: No results found.  Procedures Dr. Leonor Dawn (08/02/2024) -  Procedure: Exploratory laparotomy, Arlyss patch repair of perforated gastric ulcer   HPI: [name] is a 63 y.o. female with hx of HTN, HLD and who presented to the ED for abdominal pain. Patient reports yesterday evening around 5pm, while she was lying down, she began having severe abdominal pain that felt like a tire wrapped around her in her mid abdomen with radiation to her SS/left chest and left shoulder. Pain is worse with breathing and palpation with associated sob. No fever, n/v, or urinary symptoms. She did have n/v 2 weeks ago that was followed by constipation but had a normal, non-bloody bm on the day of symptom onset. She is on chronic nsaids for knee pain. She reports occasional etoh use. She denies  tobacco or illicit drug use. No hx of gastric ulcers. She has never had a colonoscopy. Prior open appendectomy and c-section. No other abdominal surgeries. She is not on blood thinners. She lives at home alone. She uses NSAIDs chronically.   Hospital Course:  Workup was significant for pneumoperitoneum with concern for perforated gastric ulcer based on CT of the abdomen. The patient was admitted, started on IV antibiotics, and taken emergently to the operating room for the above procedure by Dr. Dawn. Tolerated the procedure well and was transferred to the floor in stable condition with NG tube in place. IV PPI was started. Foley was removed on POD#1. WOC RN was consulted and placed a wound VAC to midline abdominal wound. Patient underwent UGI on POD#3 (8/5) that was concerning for a small, contained leak. CT scan was ordered to better evaluate and showed no evidence of leak so NG tube was removed on POD#4. Diet gradually advanced as tolerated and patient had return of bowel function. Surgical JP drain removed on POD#9. H.pylori stool antigen was negative. Patient completed a total of 5 days of post-operative IV antibiotics. Patient worked with PT/OT who recommend SNF at discharge. On 08/17/24 the patient was stable for discharge from the hospital to an accepting nursing facility. She will require post-operative follow up as below and knows to call with questions or concerns. She was discharged with wet-to-dry dressings to midline wound. She should avoid NSAID medications.   I have personally reviewed the patients medication history on the [place] controlled substance database.   I did not personally examine the patient on the day of discharge, therefore the above information was obtained entirely  from chart review.     Allergies as of 08/17/2024   No Known Allergies      Medication List     STOP taking these medications    diclofenac 50 MG EC tablet Commonly known as: VOLTAREN   gabapentin  300 MG  capsule Commonly known as: NEURONTIN    modafinil  200 MG tablet Commonly known as: PROVIGIL        TAKE these medications    acetaminophen  500 MG tablet Commonly known as: TYLENOL  Take 1,000 mg by mouth every 6 (six) hours as needed for mild pain (pain score 1-3) or moderate pain (pain score 4-6). What changed: Another medication with the same name was removed. Continue taking this medication, and follow the directions you see here.   docusate sodium  100 MG capsule Commonly known as: COLACE Take 1 capsule (100 mg total) by mouth 2 (two) times daily.   feeding supplement Liqd Take 237 mLs by mouth 2 (two) times daily between meals. Start taking on: August 18, 2024   hydrochlorothiazide  25 MG tablet Commonly known as: HYDRODIURIL  Take 25 mg by mouth in the morning.   losartan  100 MG tablet Commonly known as: COZAAR  Take 100 mg by mouth in the morning.   methocarbamol  500 MG tablet Commonly known as: ROBAXIN  Take 1 tablet (500 mg total) by mouth 3 (three) times daily.   multivitamin with minerals tablet Take 1 tablet by mouth in the morning. 55 +   oxyCODONE  5 MG immediate release tablet Commonly known as: Oxy IR/ROXICODONE  Take 1 tablet (5 mg total) by mouth every 6 (six) hours as needed for severe pain (pain score 7-10).   pantoprazole  40 MG tablet Commonly known as: PROTONIX  Take 1 tablet (40 mg total) by mouth 2 (two) times daily.   polyethylene glycol 17 g packet Commonly known as: MIRALAX  / GLYCOLAX  Take 17 g by mouth 2 (two) times daily as needed.   rosuvastatin  10 MG tablet Commonly known as: CRESTOR  Take 10 mg by mouth at bedtime.   Teriflunomide  14 MG Tabs Take 14 mg by mouth at bedtime. Aubagio    Vitamin D -3 125 MCG (5000 UT) Tabs Take 5,000 Units by mouth at bedtime.          Follow-up Information     Dasie Leonor CROME, MD Follow up.   Specialty: General Surgery Why: our office is scheduling you for post-operative follow up in 3-4 weeks. call  to confirm appointment date/time. Contact information: [address] [place] [address] [phone]                 Signed: Almarie Pringle, [facility] 08/17/2024, 2:44 PM

## 2024-08-17 NOTE — Progress Notes (Signed)
 15 Days Post-Op  Subjective: CC: Reports no abdominal pain, n/v. Tolerating diet. Passing flatus. No BM in 3d. Voiding. Mobilizing.   Objective: Vital signs in last 24 hours: Temp:  [98 F (36.7 C)-98.5 F (36.9 C)] 98 F (36.7 C) (08/19 0805) Pulse Rate:  [91-94] 93 (08/19 0805) Resp:  [17] 17 (08/19 0805) BP: (103-125)/(72-79) 115/78 (08/19 0805) SpO2:  [97 %-100 %] 98 % (08/19 0805) Last BM Date : 08/14/24  Intake/Output from previous day: 08/18 0701 - 08/19 0700 In: 360 [P.O.:360] Out: -  Intake/Output this shift: No intake/output data recorded.  PE: Gen:  Alert, NAD, pleasant Card:  Reg Pulm:  Rate and effort normal Abd: Soft, ND, NT, midline wound clean. JP drain site with dressing in place, cdi Psych: A&Ox3     Lab Results:  No results for input(s): WBC, HGB, HCT, PLT in the last 72 hours. BMET No results for input(s): NA, K, CL, CO2, GLUCOSE, BUN, CREATININE, CALCIUM  in the last 72 hours. PT/INR No results for input(s): LABPROT, INR in the last 72 hours. CMP     Component Value Date/Time   NA 138 08/05/2024 0310   NA 140 01/17/2020 1326   K 3.5 08/05/2024 0310   CL 102 08/05/2024 0310   CO2 24 08/05/2024 0310   GLUCOSE 92 08/05/2024 0310   BUN 8 08/05/2024 0310   BUN 19 01/17/2020 1326   CREATININE 0.79 08/09/2024 0421   CALCIUM  8.7 (L) 08/05/2024 0310   PROT 6.9 08/02/2024 0532   PROT 6.3 01/17/2020 1326   ALBUMIN  3.2 (L) 08/02/2024 0532   ALBUMIN  4.1 01/17/2020 1326   AST 18 08/02/2024 0532   ALT 15 08/02/2024 0532   ALKPHOS 82 08/02/2024 0532   BILITOT 0.5 08/02/2024 0532   BILITOT 0.4 01/17/2020 1326   GFRNONAA >60 08/09/2024 0421   GFRAA 104 01/17/2020 1326   Lipase     Component Value Date/Time   LIPASE 24 08/02/2024 0532    Studies/Results: No results found.  Anti-infectives: Anti-infectives (From admission, onward)    Start     Dose/Rate Route Frequency Ordered Stop   08/02/24 1630   fluconazole  (DIFLUCAN ) IVPB 200 mg        200 mg 100 mL/hr over 60 Minutes Intravenous Every 24 hours 08/02/24 1541 08/07/24 1629   08/02/24 1600  piperacillin -tazobactam (ZOSYN ) IVPB 3.375 g        3.375 g 12.5 mL/hr over 240 Minutes Intravenous Every 8 hours 08/02/24 1541 08/07/24 1002   08/02/24 0615  piperacillin -tazobactam (ZOSYN ) IVPB 3.375 g        3.375 g 100 mL/hr over 30 Minutes Intravenous  Once 08/02/24 0604 08/02/24 9286        Assessment/Plan POD#15 s/p Ex lap, Graham patch repair of perforated gastric ulcer - Dr. Dasie 08/02/24  - Path with Inflammatory exudate consistent with origin from an ulcer. No mucosal tissue present.  - CT 8/8 neg for leak - Completed 5d post op Zosyn /Fluconazole   - JP drain out POD 9 - BID PPI - H pylori stool antigen neg - BID WTD to midline wound - Multimodal pain control. Avoid NSAIDs - Mobilize, PT/OT - Pulm toilet - Home medications for HTN, HLD and MS    FEN - Soft, BID PPI, increase bowel regimen, SLIV  VTE - SCDs, Lovenox  ID - Zosyn /Fluconazole  for 5 days postop completed. None currently.  Dispo -  Surgically stable for discharge to SNF - pending per SW, CM.   LOS: 15 days  Ozell CHRISTELLA Shaper, [facility] 08/17/2024, 8:57 AM Please see Amion for pager number during day hours 7:00am-4:30pm

## 2024-08-19 DIAGNOSIS — M170 Bilateral primary osteoarthritis of knee: Secondary | ICD-10-CM | POA: Diagnosis not present

## 2024-08-19 DIAGNOSIS — I82402 Acute embolism and thrombosis of unspecified deep veins of left lower extremity: Secondary | ICD-10-CM | POA: Diagnosis not present

## 2024-08-19 DIAGNOSIS — K219 Gastro-esophageal reflux disease without esophagitis: Secondary | ICD-10-CM | POA: Diagnosis not present

## 2024-08-19 DIAGNOSIS — I2699 Other pulmonary embolism without acute cor pulmonale: Secondary | ICD-10-CM | POA: Diagnosis not present

## 2024-08-19 DIAGNOSIS — Z6841 Body Mass Index (BMI) 40.0 and over, adult: Secondary | ICD-10-CM | POA: Diagnosis not present

## 2024-09-08 ENCOUNTER — Emergency Department (HOSPITAL_COMMUNITY)

## 2024-09-08 ENCOUNTER — Observation Stay (HOSPITAL_COMMUNITY)
Admission: EM | Admit: 2024-09-08 | Discharge: 2024-09-09 | Disposition: A | Discharge status: Skilled Nursing Facility | Attending: Internal Medicine | Admitting: Internal Medicine

## 2024-09-08 ENCOUNTER — Other Ambulatory Visit: Payer: Self-pay

## 2024-09-08 ENCOUNTER — Encounter (HOSPITAL_COMMUNITY): Payer: Self-pay | Admitting: Internal Medicine

## 2024-09-08 DIAGNOSIS — I10 Essential (primary) hypertension: Secondary | ICD-10-CM | POA: Insufficient documentation

## 2024-09-08 DIAGNOSIS — I700 Atherosclerosis of aorta: Secondary | ICD-10-CM | POA: Diagnosis not present

## 2024-09-08 DIAGNOSIS — R262 Difficulty in walking, not elsewhere classified: Secondary | ICD-10-CM | POA: Diagnosis not present

## 2024-09-08 DIAGNOSIS — I2699 Other pulmonary embolism without acute cor pulmonale: Secondary | ICD-10-CM | POA: Diagnosis not present

## 2024-09-08 DIAGNOSIS — E785 Hyperlipidemia, unspecified: Secondary | ICD-10-CM | POA: Diagnosis not present

## 2024-09-08 DIAGNOSIS — G35 Multiple sclerosis: Secondary | ICD-10-CM | POA: Insufficient documentation

## 2024-09-08 DIAGNOSIS — K449 Diaphragmatic hernia without obstruction or gangrene: Secondary | ICD-10-CM | POA: Insufficient documentation

## 2024-09-08 DIAGNOSIS — R079 Chest pain, unspecified: Secondary | ICD-10-CM | POA: Diagnosis present

## 2024-09-08 LAB — I-STAT CHEM 8, ED
BUN: 10 mg/dL (ref 8–23)
Calcium, Ion: 1.05 mmol/L — ABNORMAL LOW (ref 1.15–1.40)
Chloride: 97 mmol/L — ABNORMAL LOW (ref 98–111)
Creatinine, Ser: 0.7 mg/dL (ref 0.44–1.00)
Glucose, Bld: 130 mg/dL — ABNORMAL HIGH (ref 70–99)
HCT: 30 % — ABNORMAL LOW (ref 36.0–46.0)
Hemoglobin: 10.2 g/dL — ABNORMAL LOW (ref 12.0–15.0)
Potassium: 3.5 mmol/L (ref 3.5–5.1)
Sodium: 135 mmol/L (ref 135–145)
TCO2: 26 mmol/L (ref 22–32)

## 2024-09-08 LAB — CBC WITH DIFFERENTIAL/PLATELET
Abs Immature Granulocytes: 0.04 K/uL (ref 0.00–0.07)
Basophils Absolute: 0.1 K/uL (ref 0.0–0.1)
Basophils Relative: 1 %
Eosinophils Absolute: 0 K/uL (ref 0.0–0.5)
Eosinophils Relative: 0 %
HCT: 31 % — ABNORMAL LOW (ref 36.0–46.0)
Hemoglobin: 9.8 g/dL — ABNORMAL LOW (ref 12.0–15.0)
Immature Granulocytes: 1 %
Lymphocytes Relative: 20 %
Lymphs Abs: 1.6 K/uL (ref 0.7–4.0)
MCH: 28.1 pg (ref 26.0–34.0)
MCHC: 31.6 g/dL (ref 30.0–36.0)
MCV: 88.8 fL (ref 80.0–100.0)
Monocytes Absolute: 0.9 K/uL (ref 0.1–1.0)
Monocytes Relative: 11 %
Neutro Abs: 5.3 K/uL (ref 1.7–7.7)
Neutrophils Relative %: 67 %
Platelets: 396 K/uL (ref 150–400)
RBC: 3.49 MIL/uL — ABNORMAL LOW (ref 3.87–5.11)
RDW: 14.4 % (ref 11.5–15.5)
WBC: 7.9 K/uL (ref 4.0–10.5)
nRBC: 0 % (ref 0.0–0.2)

## 2024-09-08 LAB — COMPREHENSIVE METABOLIC PANEL WITH GFR
ALT: 11 U/L (ref 0–44)
AST: 20 U/L (ref 15–41)
Albumin: 2.8 g/dL — ABNORMAL LOW (ref 3.5–5.0)
Alkaline Phosphatase: 70 U/L (ref 38–126)
Anion gap: 13 (ref 5–15)
BUN: 9 mg/dL (ref 8–23)
CO2: 26 mmol/L (ref 22–32)
Calcium: 9.1 mg/dL (ref 8.9–10.3)
Chloride: 95 mmol/L — ABNORMAL LOW (ref 98–111)
Creatinine, Ser: 0.75 mg/dL (ref 0.44–1.00)
GFR, Estimated: >60 mL/min (ref >60)
Glucose, Bld: 128 mg/dL — ABNORMAL HIGH (ref 70–99)
Potassium: 3.8 mmol/L (ref 3.5–5.1)
Sodium: 134 mmol/L — ABNORMAL LOW (ref 135–145)
Total Bilirubin: 0.8 mg/dL (ref 0.0–1.2)
Total Protein: 6.8 g/dL (ref 6.5–8.1)

## 2024-09-08 LAB — URINALYSIS, W/ REFLEX TO CULTURE (INFECTION SUSPECTED)
Bacteria, UA: NONE SEEN
Bilirubin Urine: NEGATIVE
Glucose, UA: NEGATIVE mg/dL
Hgb urine dipstick: NEGATIVE
Ketones, ur: NEGATIVE mg/dL
Leukocytes,Ua: NEGATIVE
Nitrite: NEGATIVE
Protein, ur: NEGATIVE mg/dL
Specific Gravity, Urine: >1.046 — ABNORMAL HIGH (ref 1.005–1.030)
pH: 6 (ref 5.0–8.0)

## 2024-09-08 LAB — LIPASE, BLOOD: Lipase: 23 U/L (ref 11–51)

## 2024-09-08 MED ORDER — ROSUVASTATIN CALCIUM 5 MG PO TABS
10.0000 mg | ORAL_TABLET | Freq: Every day | ORAL | Status: DC
  Administered 2024-09-08: 10 mg via ORAL
  Filled 2024-09-08: qty 2

## 2024-09-08 MED ORDER — LACTATED RINGERS IV SOLN: INTRAVENOUS | Status: DC

## 2024-09-08 MED ORDER — LOSARTAN POTASSIUM 50 MG PO TABS
100.0000 mg | ORAL_TABLET | Freq: Every morning | ORAL | Status: DC
  Administered 2024-09-09: 100 mg via ORAL
  Filled 2024-09-08: qty 2

## 2024-09-08 MED ORDER — IOHEXOL 350 MG/ML SOLN
75.0000 mL | Freq: Once | INTRAVENOUS | Status: AC | PRN
  Administered 2024-09-08: 75 mL via INTRAVENOUS

## 2024-09-08 MED ORDER — HEPARIN BOLUS VIA INFUSION
4500.0000 [IU] | Freq: Once | INTRAVENOUS | Status: AC
  Administered 2024-09-08: 4500 [IU] via INTRAVENOUS
  Filled 2024-09-08: qty 4500

## 2024-09-08 MED ORDER — DOCUSATE SODIUM 100 MG PO CAPS
100.0000 mg | ORAL_CAPSULE | Freq: Two times a day (BID) | ORAL | Status: DC
  Administered 2024-09-08 – 2024-09-09 (×2): 100 mg via ORAL
  Filled 2024-09-08 (×2): qty 1

## 2024-09-08 MED ORDER — HEPARIN (PORCINE) 25000 UT/250ML-% IV SOLN
1450.0000 [IU]/h | INTRAVENOUS | Status: DC
  Administered 2024-09-08: 1350 [IU]/h via INTRAVENOUS
  Administered 2024-09-09: 1450 [IU]/h via INTRAVENOUS
  Filled 2024-09-08 (×2): qty 250

## 2024-09-08 MED ORDER — HYDROMORPHONE HCL 1 MG/ML IJ SOLN
0.5000 mg | Freq: Once | INTRAMUSCULAR | Status: AC
  Administered 2024-09-08: 0.5 mg via INTRAVENOUS
  Filled 2024-09-08: qty 1

## 2024-09-08 MED ORDER — POLYETHYLENE GLYCOL 3350 17 G PO PACK: 17.0000 g | PACK | Freq: Two times a day (BID) | ORAL | Status: DC | PRN

## 2024-09-08 MED ORDER — PANTOPRAZOLE SODIUM 40 MG PO TBEC
40.0000 mg | DELAYED_RELEASE_TABLET | Freq: Two times a day (BID) | ORAL | Status: DC
  Administered 2024-09-08 – 2024-09-09 (×2): 40 mg via ORAL
  Filled 2024-09-08 (×2): qty 1

## 2024-09-08 MED ORDER — HYDROCHLOROTHIAZIDE 25 MG PO TABS
25.0000 mg | ORAL_TABLET | Freq: Every morning | ORAL | Status: DC
  Administered 2024-09-09: 25 mg via ORAL
  Filled 2024-09-08: qty 1

## 2024-09-08 MED ORDER — METHOCARBAMOL 500 MG PO TABS
500.0000 mg | ORAL_TABLET | Freq: Three times a day (TID) | ORAL | Status: DC
  Administered 2024-09-08 – 2024-09-09 (×3): 500 mg via ORAL
  Filled 2024-09-08 (×3): qty 1

## 2024-09-08 MED ORDER — IOHEXOL 350 MG/ML SOLN
65.0000 mL | Freq: Once | INTRAVENOUS | Status: AC | PRN
  Administered 2024-09-08: 65 mL via INTRAVENOUS

## 2024-09-08 MED ORDER — TERIFLUNOMIDE 14 MG PO TABS: 14.0000 mg | ORAL_TABLET | Freq: Every day | ORAL | Status: DC

## 2024-09-08 MED ORDER — OXYCODONE HCL 5 MG PO TABS
5.0000 mg | ORAL_TABLET | Freq: Four times a day (QID) | ORAL | Status: DC | PRN
  Administered 2024-09-08 – 2024-09-09 (×3): 5 mg via ORAL
  Filled 2024-09-08 (×3): qty 1

## 2024-09-08 NOTE — ED Notes (Signed)
 Patient transported to CT

## 2024-09-08 NOTE — ED Notes (Signed)
 Called and placed PT on monitor with CCMD

## 2024-09-08 NOTE — ED Notes (Signed)
 PT was cleaned and bed changed.

## 2024-09-08 NOTE — H&P (Signed)
 Date: 09/08/2024               Patient Name:  [name] MRN: [id number]  DOB: [birth year] Age / Sex: 63 y.o., female   PCP: Card, Norleen SQUIBB, MD         Medical Service: Internal Medicine Teaching Service         Attending Physician: Dr. [place] [facility]      First Contact: Rebecka Pion, DO}    Second Contact: Dr. Roetta Chars, MD          Pager Information: First Contact Pager: [phone]   Second Contact Pager: [phone]   SUBJECTIVE   Chief Complaint: Right sided Chest pain  History of Present Illness: [name] is a 63 y.o. female with PMH of HTN, HLD, MS, s/p exploratory laparotomy for perforated gastric ulcer (08/02/2024) who came in to the ED for right sided chest pain that started this morning. Pt said she woke up from the pain which was sharp in nature and was located underneath her right breast and shot up to her right arm and right side of the neck. She was given oxycodone  which subsided the pain. Pain returned and was stabbing in nature in the same area which was not relieved that time by oxycodone  use. Pain was 8/10 in the morning. On our evaluation, pt said the pain was still present and occurred when she talks or breathes. Pain was in her right sided breast and went around her back and was at a 6/10.  Pt denies fevers, chills. She is not on supplemental O2 and oxygen levels were fine when checked at her SNF but BP was a little high, which pt believed was because of the pain. She denied any SOB, but kept saying it just hurt her. Denies any nausea, vomiting. Last bowel movement was yesterday--no changes and no blood in stools. Ate two pieces of bacon this morning. She has been ambulating well at [facility] with her PT/OT sessions. Has LE swelling that is chronic without any acute changes. Pt has no hx of DVTs or PEs. She mentioned have low energy level sometimes due to her hx of MS. Pt has no hx of cancer. Denies any recent long-distance travel.     ED Course: Labs  significant for : Na: 134 Hgb: 9.8 Imaging : CTA of chest: Acute R lower lobe pulmonary emboli. Borderline RV: LV ratio that is similar to prior chest CT from August. CT abdomen/ pelvis: PE in lower lobe branches of right pulmonary artery. No signs of bowel obstruction or inflammation.  Received:  Heparin  bolus and Heparin  drip. Oxycodone  PRN Consulted IMTS  Meds:  Patient reported:   Tylenol  500mg   Hydrochlorothiazide  25mg   Losartan  100mg   Robaxin  500mg  TID,  oxy 5mg ,  pantoprazole  40mg ,  Risuvastatin 10mg   Teriflunomaide 14mg    No outpatient medications have been marked as taking for the 09/08/24 encounter [facility] Encounter).    Past Medical History HTN, HLD, MS, s/p exploratory laparotomy for perforated gastric ulcer (08/02/2024)   Past Surgical History Past Surgical History:  Procedure Laterality Date   APPENDECTOMY     CESAREAN SECTION     GASTRORRHAPHY N/A 08/02/2024   Procedure: [name] REPAIR;  Surgeon: [name] Leonor CROME, MD;  Location: [facility] OR;  Service: General;  Laterality: N/A;   HAND SURGERY     LAPAROTOMY N/A 08/02/2024   Procedure: LAPAROTOMY, EXPLORATORY;  Surgeon: [name] Leonor CROME, MD;  Location: [facility] OR;  Service: General;  Laterality: N/A;  lumbar back surgery     LUMBAR WOUND DEBRIDEMENT N/A 12/05/2020   Procedure: Lumbar Wound Exploration;  Surgeon: [name] Duncans, MD;  Location: [facility] OR;  Service: Neurosurgery;  Laterality: N/A;  3C/RM 21   LUMBAR WOUND DEBRIDEMENT N/A 02/21/2021   Procedure: Lumbar wound exploration;  Surgeon: [name] Duncans, MD;  Location: [facility] OR;  Service: Neurosurgery;  Laterality: N/A;  posterior   LUMBAR WOUND DEBRIDEMENT N/A 04/11/2021   Procedure: Wound Exploration with Wound Vac Placement;  Surgeon: [name] Duncans, MD;  Location: [facility] OR;  Service: Neurosurgery;  Laterality: N/A;   ROTATOR CUFF REPAIR     TONSILLECTOMY       Social:  Lives With:Currently at [facility]; Otherwise at home by herself  Support: Has plenty of support from  nieces and nephews who check up on her often Level of Function: Ambulates around the house well with her walker, Independent in ADLs and iADLs PCP: Card, Norleen SQUIBB, MD  Substances: -Tobacco: No hx of smoking tobacco -Alcohol: Consumes socially only  -Recreational Drug: Used some marijuana in college and some for her MS--not anything recently  Family History:  Family History  Problem Relation Age of Onset   High blood pressure Mother    High Cholesterol Mother    Thyroid disease Mother    Cancer Mother    High blood pressure Father    High Cholesterol Father    Heart disease Father    Obesity Father   Mom: Breast Cancer Allergies: Allergies as of 09/08/2024 - Reviewed 09/08/2024  Allergen Reaction Noted   Morphine  Other (See Comments) 09/08/2024    Review of Systems: A complete ROS was negative except as per HPI.   OBJECTIVE:   Physical Exam: Blood pressure 106/76, pulse 92, temperature 98.3 F (36.8 C), resp. rate 16, height 5' 3 (1.6 m), weight 108.9 kg, SpO2 97%.  Constitutional: obese female in bed, in mild distress  HENT: normocephalic atraumatic, mucous membranes moist Eyes: conjunctiva non-erythematous Neck: supple Cardiovascular: regular rate and rhythm, no m/r/g Pulmonary/Chest: normal work of breathing on room air, lungs clear to auscultation bilaterally Abdominal: soft, covered with bandages over the wound with no signs of bleeding, mild tenderness on palpation MSK: normal bulk and tone; Swelling in BL LE Neurological: alert & oriented x 3, 5/5 strength in bilateral upper and lower extremities Skin: warm and dry   Labs: CBC    Component Value Date/Time   WBC 7.9 09/08/2024 1334   RBC 3.49 (L) 09/08/2024 1334   HGB 9.8 (L) 09/08/2024 1334   HCT 31.0 (L) 09/08/2024 1334   PLT 396 09/08/2024 1334   MCV 88.8 09/08/2024 1334   MCH 28.1 09/08/2024 1334   MCHC 31.6 09/08/2024 1334   RDW 14.4 09/08/2024 1334   LYMPHSABS 1.6 09/08/2024 1334   MONOABS 0.9  09/08/2024 1334   EOSABS 0.0 09/08/2024 1334   BASOSABS 0.1 09/08/2024 1334     CMP     Component Value Date/Time   NA 134 (L) 09/08/2024 1334   NA 140 01/17/2020 1326   K 3.8 09/08/2024 1334   CL 95 (L) 09/08/2024 1334   CO2 26 09/08/2024 1334   GLUCOSE 128 (H) 09/08/2024 1334   [name] 9 09/08/2024 1334   [name] 19 01/17/2020 1326   CREATININE 0.75 09/08/2024 1334   CALCIUM  9.1 09/08/2024 1334   PROT 6.8 09/08/2024 1334   PROT 6.3 01/17/2020 1326   ALBUMIN  2.8 (L) 09/08/2024 1334   ALBUMIN  4.1 01/17/2020 1326   AST 20 09/08/2024  1334   ALT 11 09/08/2024 1334   ALKPHOS 70 09/08/2024 1334   BILITOT 0.8 09/08/2024 1334   BILITOT 0.4 01/17/2020 1326   GFRNONAA >60 09/08/2024 1334   GFRAA 104 01/17/2020 1326    Imaging:  CT Angio Chest PE W/Cm &/Or Wo Cm Addendum Date: 09/08/2024 ADDENDUM REPORT: 09/08/2024 15:39 ADDENDUM: Additional finding and comparison with MRI right shoulder report from 2015. Moderate advanced right glenohumeral degenerative change. Moderate lobulated right shoulder effusion with thickened appearing synovium which is presumably chronic. Electronically Signed   By: [name] M.D.   On: 09/08/2024 15:39   Result Date: 09/08/2024 CLINICAL DATA:  Right upper quadrant pain EXAM: CT ANGIOGRAPHY CHEST WITH CONTRAST TECHNIQUE: Multidetector CT imaging of the chest was performed using the standard protocol during bolus administration of intravenous contrast. Multiplanar CT image reconstructions and MIPs were obtained to evaluate the vascular anatomy. RADIATION DOSE REDUCTION: This exam was performed according to the departmental dose-optimization program which includes automated exposure control, adjustment of the mA and/or kV according to patient size and/or use of iterative reconstruction technique. CONTRAST:  65mL OMNIPAQUE  IOHEXOL  350 MG/ML SOLN COMPARISON:  CT 09/08/2024, chest CT 08/02/2024 FINDINGS: Cardiovascular: Satisfactory opacification of the pulmonary arteries  to the segmental level. Mild artifact within the pulmonary trunk. Positive for acute right lower lobe lobar, segmental and subsegmental pulmonary emboli. RV LV ratio of 0.97 but similar measurement compared with 08/02/2024 at which time no embolus was seen. Moderate aortic atherosclerosis. No aneurysm or dissection. Coronary vascular calcification. Upper normal cardiac size. No pericardial effusion Mediastinum/Nodes: Patent trachea. No thyroid mass. No suspicious lymph nodes. Moderate hiatal hernia. Circumferential mid to distal esophageal thickening. Lungs/Pleura: Small right-sided pleural effusion. Patchy atelectasis or mild pneumonia at the right base Upper Abdomen: See separately dictated CT Musculoskeletal: No acute osseous abnormality Review of the MIP images confirms the above findings. IMPRESSION: 1. Positive for acute right lower lobe pulmonary emboli. Borderline RV LV ratio but similar compared with prior chest CT from August at which time no emboli were present 2. Small right-sided pleural effusion. Patchy atelectasis or mild pneumonia at the right base. 3. Moderate hiatal hernia. Circumferential mid to distal esophageal thickening, question esophagitis. 4. Aortic atherosclerosis. Critical test value previously documented on abdomen pelvis CT performed earlier today Aortic Atherosclerosis (ICD10-I70.0). Electronically Signed: By: [name] M.D. On: 09/08/2024 15:14   CT ABDOMEN PELVIS W CONTRAST Result Date: 09/08/2024 CLINICAL DATA:  Right upper quadrant and chest pain. EXAM: CT ABDOMEN AND PELVIS WITH CONTRAST TECHNIQUE: Multidetector CT imaging of the abdomen and pelvis was performed using the standard protocol following bolus administration of intravenous contrast. RADIATION DOSE REDUCTION: This exam was performed according to the departmental dose-optimization program which includes automated exposure control, adjustment of the mA and/or kV according to patient size and/or use of iterative  reconstruction technique. CONTRAST:  75mL OMNIPAQUE  IOHEXOL  350 MG/ML SOLN COMPARISON:  August 06, 2024. FINDINGS: Lower chest: There does appear to be pulmonary embolus seen in lower lobe branches of right pulmonary artery. Small right pleural effusion is noted with adjacent atelectasis or infiltrate. Hepatobiliary: No focal liver abnormality is seen. No gallstones, gallbladder wall thickening, or biliary dilatation. Pancreas: Unremarkable. No pancreatic ductal dilatation or surrounding inflammatory changes. Spleen: Normal in size without focal abnormality. Adrenals/Urinary Tract: Adrenal glands are unremarkable. Kidneys are normal, without renal calculi, focal lesion, or hydronephrosis. Bladder is unremarkable. Stomach/Bowel: Moderate size hiatal hernia is noted. Status post appendectomy. No evidence of bowel obstruction or inflammation. Vascular/Lymphatic: Aortic atherosclerosis.  No enlarged abdominal or pelvic lymph nodes. Reproductive: Uterus and bilateral adnexa are unremarkable. Other: No ascites or hernia is noted. Musculoskeletal: Status post surgical posterior fusion of L4-5. IMPRESSION: 1. There does appear to be pulmonary embolus seen in lower lobe branches of right pulmonary artery. Small right pleural effusion is noted with adjacent atelectasis or infiltrate. Critical Value/emergent results were called by telephone at the time of interpretation on 09/08/2024 at 2:13 pm to provider [name] , who verbally acknowledged these results. 2. Moderate size hiatal hernia. 3. Aortic atherosclerosis. Aortic Atherosclerosis (ICD10-I70.0). Electronically Signed   By: [name] M.D.   On: 09/08/2024 14:13   DG ABD ACUTE 2+V W 1V CHEST Result Date: 09/08/2024 EXAM: UPRIGHT AND SUPINE XRAY VIEWS OF THE ABDOMEN AND 4 VIEW(S) OF THE CHEST 09/08/2024 01:19:00 PM COMPARISON: 08/02/2024. CT of the abdomen and pelvis 08/06/2024. CLINICAL HISTORY: Abdominal pain; right upper quadrant pain that was radiating to right  side of chest and neck. Mimics pain when patient had perforated ulcer in August. Pain started this am without relief. Intermittent in nature. Patient had surgery for ulcer in August, current wound on abdomen. Wound care performed BID, changed this am. Patient currently in rehab. FINDINGS: LUNGS AND PLEURA: Mild pulmonary vascular congestion is present. Lung volumes are low. No consolidation or pulmonary edema. No pleural effusion or pneumothorax. HEART AND MEDIASTINUM: Heart size is upper limits of normal. Atherosclerotic calcifications are again noted at the aortic arch. No acute abnormality of the cardiac and mediastinal silhouettes. BOWEL: The bowel gas pattern is nonspecific. No bowel obstruction. Previously noted pneumoperitoneum has resolved. PERITONEUM AND SOFT TISSUES: Soft tissue calcifications are generated at the level of the pelvis bilaterally. No abnormal calcifications. No free air. BONES: Lower lumbar fusion is noted. Degenerative changes are present about shoulders. No acute osseous abnormality. IMPRESSION: 1. No bowel obstruction or free air. Previously noted pneumoperitoneum has resolved. 2. Mild pulmonary vascular congestion. No consolidation, pulmonary edema, pleural effusion, or pneumothorax. 3. Aortic atherosclerosis. Electronically signed by: Lonni Necessary MD 09/08/2024 01:26 PM EDT RP Workstation: [id number]     EKG: personally reviewed my interpretation is NSR with left axis deviation. Prior EKG showed sinus tachycardia with left axis deviation.   ASSESSMENT & PLAN:   Assessment & Plan by Problem: Principal Problem:   Acute pulmonary embolism (HCC)   [name] is a 63 y.o. person living with a history of who presented with chest pain and admitted for acute PE on hospital day 0  Chest Pain Acute PE Pt started having sharp pains this morning from underneath the right side of the breast which is currently radiating to her back. CTA chest showed pulmonary emboli in  lower lobe branches of right pulmonary artery that is likely the cause of her pain. Pt is obese but has no hx of recent long-distance travel and no prior hx of PE/DVTs. Pt was recently treated for a perforated gastric ulcer form 08/02/24-08/17/24. Her PE is likely provoked by recent procedure of exploratory laparotomy for a Arlyss path repair of her perforated gastric ulcer. Ct abdomen was negative for any perforated ulcer, cholelithiasis, pancreatitis. Pt is currently on a heparin  drip. She is satting well on RA with no respiratory distress. Will transition to DOAC which she needs to be on for at least 3 months for her PE which was likely provoked due to a recent procedure.   --On Heparin  Drip --Will transition to DOAC --Pain controlled with Oxycodone  PRN  HTN Recent BP 119/65.  Will continue home medications: losartan  100 mg, hydrochlorothiazide  25 mg   HLD Continue home crestor  10 mg daily  MS Continue home Teriflunomide  14 mg   Best practice: Diet: Carb-Modified VTE: Heparin  IVF: LR,125cc/hr Code: Full  Disposition planning: Prior to Admission Living Arrangement: SNF Anticipated Discharge Location: SNF  Dispo: Admit patient to Observation with expected length of stay less than 2 midnights.  Signed: Edgardo Pontiff, DO Internal Medicine Resident  09/08/2024, 4:55 PM  On Call pager: [phone]

## 2024-09-08 NOTE — Progress Notes (Signed)
 PHARMACY - ANTICOAGULATION CONSULT NOTE  Pharmacy Consult for heparin  Indication: pulmonary embolus  Allergies  Allergen Reactions   Morphine  Other (See Comments)    Patient endorses heightened sensitivity    Patient Measurements: Height: 5' 3 (160 cm) Weight: 108.9 kg (240 lb) IBW/kg (Calculated) : 52.4 HEPARIN  DW (KG): 78.5  Vital Signs: Temp: 98.6 F (37 C) (09/10 1243) Temp Source: Oral (09/10 1243) BP: 110/78 (09/10 1245) Pulse Rate: 91 (09/10 1245)  Labs: Recent Labs    09/08/24 1332 09/08/24 1334  HGB 10.2* 9.8*  HCT 30.0* 31.0*  PLT  --  396  CREATININE 0.70  --     Estimated Creatinine Clearance: 83 mL/min (by C-G formula based on SCr of 0.7 mg/dL).   Medical History: Past Medical History:  Diagnosis Date   Arthritis    Back pain    Chronic knee pain    Edema of both lower extremities    High blood pressure    High cholesterol    Joint pain    Multiple sclerosis (HCC)    Neuromuscular disorder (HCC)    Multiple Sclerosis over 20 years   Obesity    Vitamin D  deficiency      Assessment: 52 YOF presenting with abdominal and CP, CT abdomen with PE in lower lobe now pending CT angio chest.  She is not on anticoagulation PTA, chronic anemia stable, plts wnl  Goal of Therapy:  Heparin  level 0.3-0.7 units/ml Monitor platelets by anticoagulation protocol: Yes   Plan:  Heparin  4500 units IV x 1, and gtt at 1350 units/hr F/u 6 hour heparin  level F/u long term AC plan  Dorn Poot, PharmD, [facility] Clinical Pharmacist ED Pharmacist Phone # [phone] 09/08/2024 2:19 PM

## 2024-09-08 NOTE — Hospital Course (Signed)
 Woke up out of her sleep with sharp pains right underneath her right breast. Went up to her right arm and neck. Subsided after oxy. Pain returned, this time sharp. Gave her another dose of oxy, and still hasn't helped. Pain is still present when she talks and breathes. Now her pain is in her breast and goes around. 8/10 pain this morning. Right now about 6. No fevers, chills, oxygen level was fine, BP was a little high because of the pain. Nodifficulty breathing, it just hurt her. No nausea, vomiting. Last bowel movement was yesterday. Has been abel to eat, two pieces of bacon. At [facility] Has been moving around with therapy. Has MS and has ero energy level sometimes. No worsening of extremity swelling. Was supposed to have her sonogram, and last July was last one. NO hx of clots.     Medications:  Tylenol  500mg   Hydrochlorothiazide  25mg   Losartan  100mg   Robaxin  500mg  TID, oxy 5mg , pantoprazole  40mg , Risuvastatin 10mg  Teriflunomaide 14mg      Lives in [place]  Has plenty of support  No hx of smoking tobacco, no rec drug use recently in college, no alcohol use besides socially.  POA: Kids, Raylynne Cubbage is her POA

## 2024-09-08 NOTE — ED Provider Notes (Signed)
 [place] EMERGENCY DEPARTMENT AT [facility] Provider Note   CSN: [id number] Arrival date & time: 09/08/24  1225     Patient presents with: Abdominal Pain   [name] is a 63 y.o. female.   63 year old female presents with acute onset of right upper quadrant abdominal pain which began early this morning.  Patient recently hospitalized for ruptured gastric ulcer.  Does have an open wound from this.  States that she has not had any fever or vomiting.  She is currently in rehab.  Pain is characterized as dull and radiates up into her right chest.  Somewhat worse with breathing.  Denies any cough or congestion.  EMS was called and patient given fentanyl  200 mcg which did help her symptoms somewhat.  Denies any urinary symptoms       Prior to Admission medications   Medication Sig Start Date End Date Taking? Authorizing Provider  acetaminophen  (TYLENOL ) 500 MG tablet Take 1,000 mg by mouth every 6 (six) hours as needed for mild pain (pain score 1-3) or moderate pain (pain score 4-6).    [provider]  Cholecalciferol  (VITAMIN D -3) 125 MCG (5000 UT) TABS Take 5,000 Units by mouth at bedtime.    [provider]  docusate sodium  (COLACE) 100 MG capsule Take 1 capsule (100 mg total) by mouth 2 (two) times daily. 08/17/24   Augustus Almarie RAMAN, PA-C  feeding supplement (ENSURE PLUS HIGH PROTEIN) LIQD Take 237 mLs by mouth 2 (two) times daily between meals. 08/18/24   Augustus Almarie RAMAN, PA-C  hydrochlorothiazide  (HYDRODIURIL ) 25 MG tablet Take 25 mg by mouth in the morning.    [provider]  losartan  (COZAAR ) 100 MG tablet Take 100 mg by mouth in the morning. 01/20/20   [provider]  methocarbamol  (ROBAXIN ) 500 MG tablet Take 1 tablet (500 mg total) by mouth 3 (three) times daily. 08/17/24   Augustus Almarie RAMAN, PA-C  Multiple Vitamins-Minerals (MULTIVITAMIN WITH MINERALS) tablet Take 1 tablet by mouth in the morning. 55 +    [provider]  oxyCODONE  (OXY IR/ROXICODONE ) 5 MG immediate release tablet Take 1 tablet (5 mg total) by mouth every 6 (six) hours as needed for severe pain (pain score 7-10). 08/17/24   Augustus Almarie RAMAN, PA-C  pantoprazole  (PROTONIX ) 40 MG tablet Take 1 tablet (40 mg total) by mouth 2 (two) times daily. 08/17/24   Simaan, [name] S, PA-C  polyethylene glycol (MIRALAX  / GLYCOLAX ) 17 g packet Take 17 g by mouth 2 (two) times daily as needed. 08/17/24   Augustus Almarie RAMAN, PA-C  rosuvastatin  (CRESTOR ) 10 MG tablet Take 10 mg by mouth at bedtime.     [provider]  Teriflunomide  14 MG TABS Take 14 mg by mouth at bedtime. Aubagio     [provider]    Allergies: Morphine     Review of Systems  All other systems reviewed and are negative.   Updated Vital Signs BP 110/78   Pulse 91   Temp 98.6 F (37 C) (Oral)   Resp 15   Ht 1.6 m (5' 3)   Wt 108.9 kg   SpO2 97%   BMI 42.51 kg/m   Physical Exam Vitals and nursing note reviewed.  Constitutional:      General: She is not in acute distress.    Appearance: Normal appearance. She is well-developed. She is not toxic-appearing.  HENT:     Head: Normocephalic and atraumatic.  Eyes:     General: Lids  are normal.     Conjunctiva/sclera: Conjunctivae normal.     Pupils: Pupils are equal, round, and reactive to light.  Neck:     Thyroid: No thyroid mass.     Trachea: No tracheal deviation.  Cardiovascular:     Rate and Rhythm: Normal rate and regular rhythm.     Heart sounds: Normal heart sounds. No murmur heard.    No gallop.  Pulmonary:     Effort: Pulmonary effort is normal. No respiratory distress.     Breath sounds: Normal breath sounds. No stridor. No decreased breath sounds, wheezing, rhonchi or rales.  Abdominal:     General: There is no distension.     Palpations: Abdomen is soft.     Tenderness: There is abdominal tenderness in the right upper quadrant and epigastric area. There is guarding. There is  no rebound.   Musculoskeletal:        General: No tenderness. Normal range of motion.     Cervical back: Normal range of motion and neck supple.  Skin:    General: Skin is warm and dry.     Findings: No abrasion or rash.  Neurological:     Mental Status: She is alert and oriented to person, place, and time. Mental status is at baseline.     GCS: GCS eye subscore is 4. GCS verbal subscore is 5. GCS motor subscore is 6.     Cranial Nerves: No cranial nerve deficit.     Sensory: No sensory deficit.     Motor: Motor function is intact.  Psychiatric:        Attention and Perception: Attention normal.        Speech: Speech normal.        Behavior: Behavior normal.     (all labs ordered are listed, but only abnormal results are displayed) Labs Reviewed  CBC WITH DIFFERENTIAL/PLATELET  COMPREHENSIVE METABOLIC PANEL WITH GFR  LIPASE, BLOOD  URINALYSIS, W/ REFLEX TO CULTURE (INFECTION SUSPECTED)  I-STAT CHEM 8, ED    EKG: EKG Interpretation Date/Time:  Wednesday September 08 2024 12:44:33 EDT Ventricular Rate:  90 PR Interval:  181 QRS Duration:  105 QT Interval:  366 QTC Calculation: 448 R Axis:   -68  Text Interpretation: Sinus rhythm Atrial premature complex Inferolateral infarct, age indeterminate Confirmed by Dasie Faden ([id number]) on 09/08/2024 1:27:34 PM  Radiology: [facility] ABD ACUTE 2+V W 1V CHEST Result Date: 09/08/2024 EXAM: UPRIGHT AND SUPINE XRAY VIEWS OF THE ABDOMEN AND 4 VIEW(S) OF THE CHEST 09/08/2024 01:19:00 PM COMPARISON: 08/02/2024. CT of the abdomen and pelvis 08/06/2024. CLINICAL HISTORY: Abdominal pain; right upper quadrant pain that was radiating to right side of chest and neck. Mimics pain when patient had perforated ulcer in August. Pain started this am without relief. Intermittent in nature. Patient had surgery for ulcer in August, current wound on abdomen. Wound care performed BID, changed this am. Patient currently in rehab. FINDINGS: LUNGS AND PLEURA: Mild  pulmonary vascular congestion is present. Lung volumes are low. No consolidation or pulmonary edema. No pleural effusion or pneumothorax. HEART AND MEDIASTINUM: Heart size is upper limits of normal. Atherosclerotic calcifications are again noted at the aortic arch. No acute abnormality of the cardiac and mediastinal silhouettes. BOWEL: The bowel gas pattern is nonspecific. No bowel obstruction. Previously noted pneumoperitoneum has resolved. PERITONEUM AND SOFT TISSUES: Soft tissue calcifications are generated at the level of the pelvis bilaterally. No abnormal calcifications. No free air. BONES: Lower lumbar fusion is noted. Degenerative changes are  present about shoulders. No acute osseous abnormality. IMPRESSION: 1. No bowel obstruction or free air. Previously noted pneumoperitoneum has resolved. 2. Mild pulmonary vascular congestion. No consolidation, pulmonary edema, pleural effusion, or pneumothorax. 3. Aortic atherosclerosis. Electronically signed by: Lonni Necessary MD 09/08/2024 01:26 PM EDT RP Workstation: [id number]     Procedures   Medications Ordered in the ED  lactated ringers  infusion ( Intravenous New Bag/Given 09/08/24 1328)                                    Medical Decision Making Amount and/or Complexity of Data Reviewed Labs: ordered. Radiology: ordered. ECG/medicine tests: ordered.  Risk Prescription drug management.   Patient complaining of upper quadrant abdominal pain that is similar to her prior gastric perforation.  Acute abdominal series did not show any evidence of free air..  CT scan of abdomen shows no evidence of intra-abdominal process but does show pulmonary embolus at the base of right lung.  Subsequent follow-up CT angio chest confirms this.  Patient started on IV heparin .  Will admit to the hospitalist team     Final diagnoses:  None    ED Discharge Orders     None          Dasie Faden, MD 09/08/24 1541

## 2024-09-08 NOTE — ED Triage Notes (Addendum)
 Patient arrives via [facility] from [facility] for right upper quadrant pain that was radiating to right side of chest and neck. Mimics pain when patient had perforated ulcer in August. Pain started this am without relief. Intermittent in nature. Patient had surgery for ulcer in August, current wound on abdomen. Wound care performed BID, changed this am. Patient currently in rehab.   Ems vitals  BP 119/78 HR 98 RR 15 CBG 17 95 on room air  20 L hand-- 200 mcg fentanyl  en route

## 2024-09-09 ENCOUNTER — Observation Stay (HOSPITAL_BASED_OUTPATIENT_CLINIC_OR_DEPARTMENT_OTHER)

## 2024-09-09 DIAGNOSIS — G35 Multiple sclerosis: Secondary | ICD-10-CM | POA: Diagnosis not present

## 2024-09-09 DIAGNOSIS — I82492 Acute embolism and thrombosis of other specified deep vein of left lower extremity: Secondary | ICD-10-CM

## 2024-09-09 DIAGNOSIS — I2609 Other pulmonary embolism with acute cor pulmonale: Secondary | ICD-10-CM

## 2024-09-09 DIAGNOSIS — I2699 Other pulmonary embolism without acute cor pulmonale: Principal | ICD-10-CM

## 2024-09-09 DIAGNOSIS — Z7901 Long term (current) use of anticoagulants: Secondary | ICD-10-CM

## 2024-09-09 DIAGNOSIS — I10 Essential (primary) hypertension: Secondary | ICD-10-CM | POA: Diagnosis not present

## 2024-09-09 DIAGNOSIS — E785 Hyperlipidemia, unspecified: Secondary | ICD-10-CM | POA: Diagnosis not present

## 2024-09-09 DIAGNOSIS — Z79899 Other long term (current) drug therapy: Secondary | ICD-10-CM

## 2024-09-09 DIAGNOSIS — Z86711 Personal history of pulmonary embolism: Secondary | ICD-10-CM | POA: Diagnosis not present

## 2024-09-09 DIAGNOSIS — I82402 Acute embolism and thrombosis of unspecified deep veins of left lower extremity: Secondary | ICD-10-CM

## 2024-09-09 LAB — BASIC METABOLIC PANEL WITH GFR
Anion gap: 12 (ref 5–15)
BUN: 7 mg/dL — ABNORMAL LOW (ref 8–23)
CO2: 25 mmol/L (ref 22–32)
Calcium: 9 mg/dL (ref 8.9–10.3)
Chloride: 97 mmol/L — ABNORMAL LOW (ref 98–111)
Creatinine, Ser: 0.71 mg/dL (ref 0.44–1.00)
GFR, Estimated: >60 mL/min (ref >60)
Glucose, Bld: 95 mg/dL (ref 70–99)
Potassium: 3.2 mmol/L — ABNORMAL LOW (ref 3.5–5.1)
Sodium: 134 mmol/L — ABNORMAL LOW (ref 135–145)

## 2024-09-09 LAB — CBC
HCT: 28.4 % — ABNORMAL LOW (ref 36.0–46.0)
Hemoglobin: 9.2 g/dL — ABNORMAL LOW (ref 12.0–15.0)
MCH: 28.8 pg (ref 26.0–34.0)
MCHC: 32.4 g/dL (ref 30.0–36.0)
MCV: 88.8 fL (ref 80.0–100.0)
Platelets: 374 K/uL (ref 150–400)
RBC: 3.2 MIL/uL — ABNORMAL LOW (ref 3.87–5.11)
RDW: 14.3 % (ref 11.5–15.5)
WBC: 6.5 K/uL (ref 4.0–10.5)
nRBC: 0 % (ref 0.0–0.2)

## 2024-09-09 LAB — HEPARIN LEVEL (UNFRACTIONATED)
Heparin Unfractionated: 0.33 [IU]/mL (ref 0.30–0.70)
Heparin Unfractionated: 0.42 [IU]/mL (ref 0.30–0.70)

## 2024-09-09 MED ORDER — LIDOCAINE 5 % EX PTCH
1.0000 | MEDICATED_PATCH | CUTANEOUS | Status: DC
  Administered 2024-09-09: 1 via TRANSDERMAL
  Filled 2024-09-09: qty 1

## 2024-09-09 MED ORDER — APIXABAN 5 MG PO TABS: ORAL_TABLET | ORAL | Status: AC

## 2024-09-09 MED ORDER — APIXABAN 5 MG PO TABS: 5.0000 mg | ORAL_TABLET | Freq: Two times a day (BID) | ORAL | Status: DC

## 2024-09-09 MED ORDER — OXYCODONE HCL 5 MG PO TABS: 5.0000 mg | ORAL_TABLET | Freq: Four times a day (QID) | ORAL | 0 refills | Status: AC | PRN

## 2024-09-09 MED ORDER — POTASSIUM CHLORIDE CRYS ER 20 MEQ PO TBCR
40.0000 meq | EXTENDED_RELEASE_TABLET | Freq: Once | ORAL | Status: AC
  Administered 2024-09-09: 40 meq via ORAL
  Filled 2024-09-09: qty 2

## 2024-09-09 MED ORDER — APIXABAN 5 MG PO TABS
10.0000 mg | ORAL_TABLET | Freq: Two times a day (BID) | ORAL | Status: DC
  Administered 2024-09-09: 10 mg via ORAL
  Filled 2024-09-09: qty 2

## 2024-09-09 MED ORDER — ACETAMINOPHEN 500 MG PO TABS: 1000.0000 mg | ORAL_TABLET | Freq: Four times a day (QID) | ORAL | Status: DC | PRN

## 2024-09-09 MED ORDER — RIVAROXABAN (XARELTO) EDUCATION KIT FOR DVT/PE PATIENTS: PACK | Freq: Once | Status: DC

## 2024-09-09 NOTE — Progress Notes (Signed)
 PT Cancellation Note  Patient Details Name: [name] MRN: [id number] DOB: [birth year]   Cancelled Treatment:    Reason Eval/Treat Not Completed: (P) Medical issues which prohibited therapy. Per chart, pt has an acute PE and began receiving heparin  at 14:59 on 9/10. Per therapy protocol, will hold off on mobilizing pt until >24 hours after heparin  initiation with acute PE unless cleared otherwise by MD. [name] out to MD and awaiting response.   Theo Ferretti, PT, DPT Acute Rehabilitation Services  Office: [phone]    Theo CHRISTELLA Ferretti 09/09/2024, 8:03 AM

## 2024-09-09 NOTE — Evaluation (Signed)
 Occupational Therapy Evaluation Patient Details Name: MALLORI ARAQUE MRN: [id number] DOB: [birth year] Today's Date: 09/09/2024   History of Present Illness   Seda LEYANI GARGUS is a 63 y.o. female who presented with R sided chest pain. She was found to have a PE. Recent admission 8/4 for ex-lap with repair of perforated gastric ulcer. PMH includes HTN, HLD, MS, OA.     Clinical Impressions Lashauna was evaluated s/p the above admission list. She has been at [facility], SNF working with therapies on walking short distances with RW and ADLs. Upon evaluation the pt was limited by weakness, knee pain, R hemi pain, anxiety with fear of falling and limited activity tolerance. Overall she was able to get to the edge of the stretcher with CGA and stand with the RW with CGA, however she needed max A to get back into bed due to elevated stretcher height. Due to the deficits listed below the pt also needs up to total A for LB Adls and set up A for UB ADLs. Pt will benefit from continued acute OT services and skilled inpatient follow up therapy, <3 hours/day.       If plan is discharge home, recommend the following:   A lot of help with walking and/or transfers;A little help with bathing/dressing/bathroom;Assistance with cooking/housework;Direct supervision/assist for medications management;Direct supervision/assist for financial management;Assist for transportation;Help with stairs or ramp for entrance     Functional Status Assessment   Patient has had a recent decline in their functional status and demonstrates the ability to make significant improvements in function in a reasonable and predictable amount of time.     Equipment Recommendations   None recommended by OT      Precautions/Restrictions   Precautions Precautions: Fall Restrictions Weight Bearing Restrictions Per Provider Order: No     Mobility Bed Mobility Overal bed mobility: Needs Assistance Bed Mobility: Supine  to Sit, Sit to Supine     Supine to sit: Contact guard Sit to supine: Max assist        Transfers Overall transfer level: Needs assistance Equipment used: Rolling walker (2 wheels) Transfers: Sit to/from Stand Sit to Stand: Contact guard assist                  Balance Overall balance assessment: Needs assistance Sitting-balance support: Feet supported Sitting balance-Leahy Scale: Fair     Standing balance support: Bilateral upper extremity supported Standing balance-Leahy Scale: Poor                             ADL either performed or assessed with clinical judgement   ADL Overall ADL's : Needs assistance/impaired Eating/Feeding: Independent   Grooming: Set up;Sitting   Upper Body Bathing: Set up;Sitting   Lower Body Bathing: Total assistance   Upper Body Dressing : Set up;Sitting   Lower Body Dressing: Total assistance   Toilet Transfer: Total assistance Toilet Transfer Details (indicate cue type and reason): bed level         Functional mobility during ADLs: Maximal assistance General ADL Comments: pt would have likely been able to do more on a lower bed, stretcher was too elevated for her. she is limited by pain, weakness and activity tolerance     Vision Baseline Vision/History: 1 Wears glasses Vision Assessment?: No apparent visual deficits     Perception Perception: Within Functional Limits       Praxis Praxis: WFL       Pertinent Vitals/Pain Pain  Assessment Pain Assessment: Faces Faces Pain Scale: Hurts little more Pain Location: knees and R side Pain Descriptors / Indicators: Grimacing, Guarding Pain Intervention(s): Monitored during session     Extremity/Trunk Assessment Upper Extremity Assessment Upper Extremity Assessment: Generalized weakness;Overall [facility] for tasks assessed   Lower Extremity Assessment Lower Extremity Assessment: Defer to PT evaluation   Cervical / Trunk Assessment Cervical / Trunk  Assessment: Other exceptions Cervical / Trunk Exceptions: body habitus   Communication Communication Communication: No apparent difficulties   Cognition Arousal: Alert Behavior During Therapy: Anxious Cognition: No apparent impairments               Following commands: Intact       Cueing  General Comments   Cueing Techniques: Verbal cues  VSS on RA, friend present           Home Living Family/patient expects to be discharged to:: Skilled nursing facility               Additional Comments: Pt has been at [facility] for ~2 weeks, plans to go back to SNF.      Prior Functioning/Environment Prior Level of Function : Needs assist             Mobility Comments: walking short distances with RW with therapies, using WC for longer distances ADLs Comments: staff assists with LB ADLs, pt is able to manage UB ADLs with set up A    OT Problem List: Decreased strength;Decreased range of motion;Decreased activity tolerance;Impaired balance (sitting and/or standing);Decreased safety awareness;Decreased knowledge of use of DME or AE;Decreased knowledge of precautions;Pain   OT Treatment/Interventions: Self-care/ADL training;Therapeutic exercise;DME and/or AE instruction;Therapeutic activities;Balance training;Patient/family education      OT Goals(Current goals can be found in the care plan section)   Acute Rehab OT Goals Patient Stated Goal: go back to [facility] OT Goal Formulation: With patient Time For Goal Achievement: 09/23/24 Potential to Achieve Goals: Good ADL Goals Pt Will Perform Lower Body Bathing: with min assist Pt Will Perform Lower Body Dressing: with mod assist;sit to/from stand Pt Will Transfer to Toilet: with min assist Additional ADL Goal #1: pt will complete bed mobility with supervision A   OT Frequency:  Min 2X/week       AM-PAC OT 6 Clicks Daily Activity     Outcome Measure Help from another person eating meals?: None Help from  another person taking care of personal grooming?: A Little Help from another person toileting, which includes using toliet, bedpan, or urinal?: A Lot Help from another person bathing (including washing, rinsing, drying)?: A Lot Help from another person to put on and taking off regular upper body clothing?: A Little Help from another person to put on and taking off regular lower body clothing?: Total 6 Click Score: 15   End of Session Equipment Utilized During Treatment: Rolling walker (2 wheels) Nurse Communication: Mobility status  Activity Tolerance: Patient tolerated treatment well Patient left: in bed;with call bell/phone within reach;with family/visitor present  OT Visit Diagnosis: Unsteadiness on feet (R26.81);Other abnormalities of gait and mobility (R26.89);Muscle weakness (generalized) (M62.81)                Time: 8894-8873 OT Time Calculation (min): 21 min Charges:  OT General Charges $OT Visit: 1 Visit OT Evaluation $OT Eval Moderate Complexity: 1 Mod  Lucie Kendall, OTR/L Acute Rehabilitation Services Office [phone] Secure Chat Communication Preferred   [name] 09/09/2024, 11:48 AM

## 2024-09-09 NOTE — Progress Notes (Signed)
 PHARMACY - ANTICOAGULATION CONSULT NOTE  Pharmacy Consult for heparin  Indication: pulmonary embolus  Allergies  Allergen Reactions   Morphine  Other (See Comments)    Patient endorses heightened sensitivity    Patient Measurements: Height: 5' 3 (160 cm) Weight: 108.9 kg (240 lb) IBW/kg (Calculated) : 52.4 HEPARIN  DW (KG): 78.5  Vital Signs: Temp: 98.3 F (36.8 C) (09/10 2340) Temp Source: Oral (09/10 2340) BP: 127/75 (09/10 2315) Pulse Rate: 97 (09/10 2345)  Labs: Recent Labs    09/08/24 1332 09/08/24 1334 09/08/24 2317  HGB 10.2* 9.8*  --   HCT 30.0* 31.0*  --   PLT  --  396  --   HEPARINUNFRC  --   --  0.33  CREATININE 0.70 0.75  --     Estimated Creatinine Clearance: 83 mL/min (by C-G formula based on SCr of 0.75 mg/dL).   Medical History: Past Medical History:  Diagnosis Date   Arthritis    Back pain    Chronic knee pain    Edema of both lower extremities    High blood pressure    High cholesterol    Joint pain    Multiple sclerosis (HCC)    Neuromuscular disorder (HCC)    Multiple Sclerosis over 20 years   Obesity    Vitamin D  deficiency      Assessment: 73 YOF presenting with abdominal and CP, CT abdomen with PE in lower lobe now pending CT angio chest.  She is not on anticoagulation PTA, chronic anemia stable, plts wnl  AM: heparin  level returned at goal on 1350 units/hr. Per RN, no signs/symptoms of bleeding.  Goal of Therapy:  Heparin  level 0.3-0.7 units/ml Monitor platelets by anticoagulation protocol: Yes   Plan:  Increase heparin  gtt to 1450 units/hr to keep within goal range F/u 6 hour confirmatory heparin  level F/u long term AC plan  Lynwood Poplar, PharmD, BCPS Clinical Pharmacist 09/09/2024 12:25 AM

## 2024-09-09 NOTE — Discharge Instructions (Signed)
 To Yolinda C Danielson or their caretakers,  You were recently admitted to [facility] for pulmonary embolism.   Continue taking your home medications with the following changes:  Start taking Eliquis  5 mg: 2 tablets tonight followed by 2 tablets 2 times a day for 6 days. Followed by 1 tablet two times a day ffor atleast 3 months after which you need to f/u with your PCP who will tell you how long you need to be on it for Continue taking All other home meds   3. Please take to your healthcare provider who has been prescribing Teriflunomide  for your MS. This medication increases your risk for having a pulmonary embolism.    You should seek further medical care if you symptoms worsen.  Please follow up with the following doctors/specialties: PCP in one week  We recommend that you also see your primary care doctor in about a week to make sure that you continue to improve. We are so glad that you are feeling better.  Sincerely,  [facility]

## 2024-09-09 NOTE — Consult Note (Addendum)
 WOC Nurse Consult Note: WOC team familiar with this patient from last admission; had perforated gastric ulcer with surgery by Dr Dasie 08/02/2024, WOC team applied NPWT until patient discharged to SNF with orders from surgery for twice daily NS WTD dressings and follow-up with surgeon in 3-4 weeks  Reason for Consult: abdominal wound  Wound type: full thickness postop as above  Pressure Injury POA: NA  Measurement: see nursing flowsheet  Wound bed: last photo documentation pink moist with small amount of scattered tan fibrinous material  Drainage (amount, consistency, odor) see nursing flowsheet  Periwound: Dressing procedure/placement/frequency: Cleanse abdominal wound with NS, apply saline moistened gauze to wound bed 2 times daily and secure with dry gauze and ABD pad and clothe tape.   WOC team will not follow. Re-consult if further needs arise.   Thank you,    Powell Bar MSN, RN-BC, [employer]

## 2024-09-09 NOTE — ED Notes (Signed)
 At 1541 Brandy RN at [facility] called, report given.

## 2024-09-09 NOTE — ED Notes (Signed)
 [facility] called unable to give a pick up time

## 2024-09-09 NOTE — ED Notes (Signed)
 Unsuccessful attempt at reaching [facility] for report x1.

## 2024-09-09 NOTE — Evaluation (Signed)
 Physical Therapy Evaluation Patient Details Name: [name] MRN: [id number] DOB: Mar 04, 1961 Today's Date: 09/09/2024  History of Present Illness  [name] is a 63 y.o. female who presented 9/10 with R sided chest pain. She was found to have a PE. Recent admission 8/4 for ex-lap with repair of perforated gastric ulcer. PMH includes HTN, HLD, MS, OA.   Clinical Impression  Pt presents with condition above and deficits mentioned below, see PT Problem List. PTA, pt was most recently at a SNF, working with PT on ambulating with a RW and on stairs to eventually d/c home. Currently, the pt displays deficits in strength, balance, power, and activity tolerance along with chronic feet sensory deficits and chronic bil knee pain. She declined OOB attempts due to anxiety due to inability to reach feet to floor while sitting on high stretcher, but she was able to perform bed mobility with bed features and CGA this date. Recommend pt return to her short-term inpatient rehab facility, < 3 hours/day, to resume skilled PT there. Will continue to follow acutely.        If plan is discharge home, recommend the following: A lot of help with walking and/or transfers;A lot of help with bathing/dressing/bathroom;Assistance with cooking/housework;Assist for transportation;Help with stairs or ramp for entrance   Can travel by private vehicle   No    Equipment Recommendations Other (comment) (defer to next venue of care)  Recommendations for Other Services       Functional Status Assessment Patient has had a recent decline in their functional status and demonstrates the ability to make significant improvements in function in a reasonable and predictable amount of time.     Precautions / Restrictions Precautions Precautions: Fall Restrictions Weight Bearing Restrictions Per Provider Order: No      Mobility  Bed Mobility Overal bed mobility: Needs Assistance Bed Mobility: Supine to Sit, Sit  to Supine     Supine to sit: Contact guard, HOB elevated, Used rails Sit to supine: Contact guard assist, Used rails, HOB elevated   General bed mobility comments: Extra time to manage legs in and out of bed, heavily using rails to pull on to pivot body to transition supine <> sit EOB, CGA for safety    Transfers                   General transfer comment: Pt anxious in regards to trying to stand from edge of stretcher due to it being high off the ground with difficulty reaching feet to floor, thereby understandably declining attempts to stand this session.    Ambulation/Gait               General Gait Details: Pt anxious in regards to trying to stand from edge of stretcher due to it being high off the ground with difficulty reaching feet to floor, thereby understandably declining attempts to stand this session.  Stairs            Wheelchair Mobility     Tilt Bed    Modified Rankin (Stroke Patients Only)       Balance Overall balance assessment: Needs assistance Sitting-balance support: Feet supported Sitting balance-Leahy Scale: Fair Sitting balance - Comments: static sitting EOB with CGA for safety and UE support, leans posteriorly on UEs on bed surface to perform LE dynamic movements       Standing balance comment: Pt anxious in regards to trying to stand from edge of stretcher due to it being high off  the ground with difficulty reaching feet to floor, thereby understandably declining attempts to stand this session.                             Pertinent Vitals/Pain Pain Assessment Pain Assessment: Faces Faces Pain Scale: Hurts little more Pain Location: bil knees Pain Descriptors / Indicators: Grimacing, Guarding, Discomfort Pain Intervention(s): Limited activity within patient's tolerance, Monitored during session, Repositioned    Home Living Family/patient expects to be discharged to:: Skilled nursing facility                    Additional Comments: Pt has been at [facility] for ~2 weeks, plans to go back to SNF.    Prior Function Prior Level of Function : Needs assist             Mobility Comments: walking short distances with RW with therapies, using WC for longer distances. Working on stairs with PT ADLs Comments: staff assists with LB ADLs, pt is able to manage UB ADLs with set up A     Extremity/Trunk Assessment   Upper Extremity Assessment Upper Extremity Assessment: Defer to OT evaluation    Lower Extremity Assessment Lower Extremity Assessment: Generalized weakness;RLE deficits/detail;LLE deficits/detail RLE Deficits / Details: hx of tingling/numbness at feet bil from MS per pt, sensation otherwise intact; Grossly >/= 4/5 bil with MMT; hx of chronic knee issues LLE Deficits / Details: hx of tingling/numbness at feet bil from MS per pt, sensation otherwise intact; Grossly >/= 4/5 bil with MMT; hx of chronic knee issues    Cervical / Trunk Assessment Cervical / Trunk Assessment: Other exceptions Cervical / Trunk Exceptions: body habitus  Communication   Communication Communication: No apparent difficulties    Cognition Arousal: Alert Behavior During Therapy: Anxious   PT - Cognitive impairments: No apparent impairments                       PT - Cognition Comments: Anxious in regards to trying to stand from edge of stretcher due to it being  high off the ground with difficulty reaching feet to floor, thereby understandably declining attempts to stand this session. Following commands: Intact       Cueing Cueing Techniques: Verbal cues     General Comments General comments (skin integrity, edema, etc.): VSS on RA    Exercises     Assessment/Plan    PT Assessment Patient needs continued PT services  PT Problem List Decreased strength;Decreased activity tolerance;Decreased balance;Decreased mobility;Impaired sensation;Pain       PT Treatment Interventions DME  instruction;Stair training;Gait training;Functional mobility training;Therapeutic exercise;Therapeutic activities;Balance training;Neuromuscular re-education;Patient/family education    PT Goals (Current goals can be found in the Care Plan section)  Acute Rehab PT Goals Patient Stated Goal: to improve and eventually return home PT Goal Formulation: With patient Time For Goal Achievement: 09/23/24 Potential to Achieve Goals: Good    Frequency Min 2X/week     Co-evaluation               AM-PAC PT 6 Clicks Mobility  Outcome Measure Help needed turning from your back to your side while in a flat bed without using bedrails?: A Little Help needed moving from lying on your back to sitting on the side of a flat bed without using bedrails?: A Little Help needed moving to and from a bed to a chair (including a wheelchair)?: Total Help needed standing up  from a chair using your arms (e.g., wheelchair or bedside chair)?: Total Help needed to walk in hospital room?: Total Help needed climbing 3-5 steps with a railing? : Total 6 Click Score: 10    End of Session   Activity Tolerance: Patient tolerated treatment well;Other (comment) (limited by anxiety with OOB mobility due to high stretcher) Patient left: in bed;with call bell/phone within reach;with nursing/sitter in room   PT Visit Diagnosis: Unsteadiness on feet (R26.81);Other abnormalities of gait and mobility (R26.89);Muscle weakness (generalized) (M62.81);Difficulty in walking, not elsewhere classified (R26.2)    Time: 8854-8841 PT Time Calculation (min) (ACUTE ONLY): 13 min   Charges:   PT Evaluation $PT Eval Moderate Complexity: 1 Mod   PT General Charges $$ ACUTE PT VISIT: 1 Visit         Theo Ferretti, PT, DPT Acute Rehabilitation Services  Office: [phone]   Theo CHRISTELLA Ferretti 09/09/2024, 12:09 PM

## 2024-09-09 NOTE — ED Notes (Signed)
 [facility] facility called at [phone]. No answer to give pt report to receiving nurse. VM left with my name and contact number for a returned call if needed. PTAR aware. Transport also took my number for receiving nurse to call me if needed.

## 2024-09-09 NOTE — Progress Notes (Signed)
 CSW received consult for patient as she is from [facility].  CSW spoke with [place] at [facility] who confirms patient can return to the facility.  Patient will be discharged to [facility], room 401 via PTAR - RN to call when ready. The number to call for report is [phone].  Niels Portugal, MSW, LCSW Transitions of Care  Clinical Social Worker II [phone]

## 2024-09-09 NOTE — Discharge Summary (Signed)
 Name: [name] MRN: [id number] DOB: [birth year] 63 y.o. PCP: Leverne Norleen SQUIBB, MD  Date of Admission: 09/08/2024 12:25 PM Date of Discharge: 09/09/2024 Attending Physician: Dr. [place] [facility]  Discharge Diagnosis: 1. Principal Problem:   Acute pulmonary embolism [name])    Discharge Medications: Allergies as of 09/09/2024       Reactions   Morphine  Other (See Comments)   Patient endorses heightened sensitivity        Medication List     TAKE these medications    acetaminophen  500 MG tablet Commonly known as: TYLENOL  Take 1,000 mg by mouth every 6 (six) hours as needed for mild pain (pain score 1-3) or moderate pain (pain score 4-6).   apixaban  5 MG Tabs tablet Commonly known as: ELIQUIS  Take 2 tablets (10 mg total) by mouth 2 (two) times daily for 6 days, THEN 1 tablet (5 mg total) 2 (two) times daily. Start taking on: September 09, 2024   docusate sodium  100 MG capsule Commonly known as: COLACE Take 1 capsule (100 mg total) by mouth 2 (two) times daily.   feeding supplement Liqd Take 237 mLs by mouth 2 (two) times daily between meals.   hydrochlorothiazide  25 MG tablet Commonly known as: HYDRODIURIL  Take 25 mg by mouth in the morning.   losartan  100 MG tablet Commonly known as: COZAAR  Take 100 mg by mouth in the morning.   methocarbamol  500 MG tablet Commonly known as: ROBAXIN  Take 1 tablet (500 mg total) by mouth 3 (three) times daily.   multivitamin with minerals tablet Take 1 tablet by mouth in the morning. 55 +   oxyCODONE  5 MG immediate release tablet Commonly known as: Oxy IR/ROXICODONE  Take 1 tablet (5 mg total) by mouth every 6 (six) hours as needed for severe pain (pain score 7-10). What changed:  when to take this reasons to take this Another medication with the same name was removed. Continue taking this medication, and follow the directions you see here.   pantoprazole  40 MG tablet Commonly known as: PROTONIX  Take 1 tablet (40 mg  total) by mouth 2 (two) times daily.   polyethylene glycol 17 g packet Commonly known as: MIRALAX  / GLYCOLAX  Take 17 g by mouth 2 (two) times daily as needed.   rosuvastatin  10 MG tablet Commonly known as: CRESTOR  Take 10 mg by mouth at bedtime.   senna 8.6 MG Tabs tablet Commonly known as: SENOKOT Take 2 tablets by mouth at bedtime.   Teriflunomide  14 MG Tabs Take 14 mg by mouth at bedtime. Aubagio    Vitamin D -3 125 MCG (5000 UT) Tabs Take 5,000 Units by mouth at bedtime.               Discharge Care Instructions  (From admission, onward)           Start     Ordered   09/09/24 0000  Discharge wound care:       Comments: Cleanse abdominal wound with NS, apply saline moistened gauze to wound bed 2 times daily and secure with dry gauze and ABD pad and clothe tape   09/09/24 1144            Disposition and follow-up:   [name] was discharged from [facility] in Good condition.  At the hospital follow up visit please address:  1.  Ask about any CP, SOB, leg pain, her MS medication use  2.  Labs / imaging needed at time of follow-up: BMP, CBC  3.  Pending labs/ test needing follow-up: none  Follow-up Appointments: PCP   Hospital Course by problem list:  [name] is a 63 y.o. person living with a PMH of HTN, HLD, MS, s/p exploratory laparotomy for perforated gastric ulcer (08/02/2024) who presented with chest pain and admitted for acute PE.  Right lower lobe pulmonary emboli Left lower extremity DVT Patient presented with acute right-sided chest pain and was found to have right lower lobe pulmonary emboli with possible right heart strain on CT.  She showed no signs of hemodynamic instability and did not need supplemental oxygen.  Also found to have left lower extremity DVT.  This is in the setting of recent surgery and has been treated as a provoked PE/DVT.  She was initially put on heparin  and then transition to Eliquis   and otherwise she was put on Tylenol , lidocaine  patches, and as needed oxycodone  for her chest pain.  She was discharged back to her skilled nursing facility.  HTN Home blood pressure medicines were continued during admission and on discharge.  HLD Continued home crestor   Multiple sclerosis No signs of acute flare.  Continued home Teriflunomide . This medication has some prothrombotic effects and pt needs to f/u with her MS specialist for any modifications required for the drug due to her recent PE.  We do not think this is the primary cause of her thrombi but do recommend that she discuss this with her doctor to see if there is a medication with similar efficacy and less thrombotic risk that she can switch to.    Subjective Saw pt at bedside this AM. Pt still had some CP which was intermittent and sharp. But it was mildly improved from yesterday.  Discharge Exam:   BP 123/89   Pulse 95   Temp 98.6 F (37 C) (Oral)   Resp 20   Ht 5' 3 (1.6 m)   Wt 108.9 kg   SpO2 98%   BMI 42.51 kg/m  Discharge exam:  Gen: A&Ox3. In NAD.  Cardiovascular: RRR-no m/r/g Pulmonary: CTAB MSK: BL LE were warm with no tenderness and redness.  Psych: normal affect  Pertinent Labs, Studies, and Procedures:     Latest Ref Rng & Units 09/09/2024    5:06 AM 09/08/2024    1:34 PM 09/08/2024    1:32 PM  CBC  WBC 4.0 - 10.5 K/uL 6.5  7.9    Hemoglobin 12.0 - 15.0 g/dL 9.2  9.8  89.7   Hematocrit 36.0 - 46.0 % 28.4  31.0  30.0   Platelets 150 - 400 K/uL 374  396         Latest Ref Rng & Units 09/09/2024    5:06 AM 09/08/2024    1:34 PM 09/08/2024    1:32 PM  CMP  Glucose 70 - 99 mg/dL 95  871  869   BUN 8 - 23 mg/dL 7  9  10    Creatinine 0.44 - 1.00 mg/dL 9.28  9.24  9.29   Sodium 135 - 145 mmol/L 134  134  135   Potassium 3.5 - 5.1 mmol/L 3.2  3.8  3.5   Chloride 98 - 111 mmol/L 97  95  97   CO2 22 - 32 mmol/L 25  26    Calcium  8.9 - 10.3 mg/dL 9.0  9.1    Total Protein 6.5 - 8.1 g/dL  6.8     Total Bilirubin 0.0 - 1.2 mg/dL  0.8    Alkaline Phos 38 -  126 U/L  70    AST 15 - 41 U/L  20    ALT 0 - 44 U/L  11      CT Angio Chest PE W/Cm &/Or Wo Cm Addendum Date: 09/08/2024 ADDENDUM REPORT: 09/08/2024 15:39 ADDENDUM: Additional finding and comparison with MRI right shoulder report from 2015. Moderate advanced right glenohumeral degenerative change. Moderate lobulated right shoulder effusion with thickened appearing synovium which is presumably chronic. Electronically Signed   By: Luke Bun M.D.   On: 09/08/2024 15:39   Result Date: 09/08/2024 CLINICAL DATA:  Right upper quadrant pain EXAM: CT ANGIOGRAPHY CHEST WITH CONTRAST TECHNIQUE: Multidetector CT imaging of the chest was performed using the standard protocol during bolus administration of intravenous contrast. Multiplanar CT image reconstructions and MIPs were obtained to evaluate the vascular anatomy. RADIATION DOSE REDUCTION: This exam was performed according to the departmental dose-optimization program which includes automated exposure control, adjustment of the mA and/or kV according to patient size and/or use of iterative reconstruction technique. CONTRAST:  65mL OMNIPAQUE  IOHEXOL  350 MG/ML SOLN COMPARISON:  CT 09/08/2024, chest CT 08/02/2024 FINDINGS: Cardiovascular: Satisfactory opacification of the pulmonary arteries to the segmental level. Mild artifact within the pulmonary trunk. Positive for acute right lower lobe lobar, segmental and subsegmental pulmonary emboli. RV LV ratio of 0.97 but similar measurement compared with 08/02/2024 at which time no embolus was seen. Moderate aortic atherosclerosis. No aneurysm or dissection. Coronary vascular calcification. Upper normal cardiac size. No pericardial effusion Mediastinum/Nodes: Patent trachea. No thyroid mass. No suspicious lymph nodes. Moderate hiatal hernia. Circumferential mid to distal esophageal thickening. Lungs/Pleura: Small right-sided pleural effusion. Patchy  atelectasis or mild pneumonia at the right base Upper Abdomen: See separately dictated CT Musculoskeletal: No acute osseous abnormality Review of the MIP images confirms the above findings. IMPRESSION: 1. Positive for acute right lower lobe pulmonary emboli. Borderline RV LV ratio but similar compared with prior chest CT from August at which time no emboli were present 2. Small right-sided pleural effusion. Patchy atelectasis or mild pneumonia at the right base. 3. Moderate hiatal hernia. Circumferential mid to distal esophageal thickening, question esophagitis. 4. Aortic atherosclerosis. Critical test value previously documented on abdomen pelvis CT performed earlier today Aortic Atherosclerosis (ICD10-I70.0). Electronically Signed: By: Luke Bun M.D. On: 09/08/2024 15:14   CT ABDOMEN PELVIS W CONTRAST Result Date: 09/08/2024 CLINICAL DATA:  Right upper quadrant and chest pain. EXAM: CT ABDOMEN AND PELVIS WITH CONTRAST TECHNIQUE: Multidetector CT imaging of the abdomen and pelvis was performed using the standard protocol following bolus administration of intravenous contrast. RADIATION DOSE REDUCTION: This exam was performed according to the departmental dose-optimization program which includes automated exposure control, adjustment of the mA and/or kV according to patient size and/or use of iterative reconstruction technique. CONTRAST:  75mL OMNIPAQUE  IOHEXOL  350 MG/ML SOLN COMPARISON:  August 06, 2024. FINDINGS: Lower chest: There does appear to be pulmonary embolus seen in lower lobe branches of right pulmonary artery. Small right pleural effusion is noted with adjacent atelectasis or infiltrate. Hepatobiliary: No focal liver abnormality is seen. No gallstones, gallbladder wall thickening, or biliary dilatation. Pancreas: Unremarkable. No pancreatic ductal dilatation or surrounding inflammatory changes. Spleen: Normal in size without focal abnormality. Adrenals/Urinary Tract: Adrenal glands are  unremarkable. Kidneys are normal, without renal calculi, focal lesion, or hydronephrosis. Bladder is unremarkable. Stomach/Bowel: Moderate size hiatal hernia is noted. Status post appendectomy. No evidence of bowel obstruction or inflammation. Vascular/Lymphatic: Aortic atherosclerosis. No enlarged abdominal or pelvic lymph nodes. Reproductive: Uterus and bilateral adnexa are  unremarkable. Other: No ascites or hernia is noted. Musculoskeletal: Status post surgical posterior fusion of L4-5. IMPRESSION: 1. There does appear to be pulmonary embolus seen in lower lobe branches of right pulmonary artery. Small right pleural effusion is noted with adjacent atelectasis or infiltrate. Critical Value/emergent results were called by telephone at the time of interpretation on 09/08/2024 at 2:13 pm to provider CURTISTINE DAWN , who verbally acknowledged these results. 2. Moderate size hiatal hernia. 3. Aortic atherosclerosis. Aortic Atherosclerosis (ICD10-I70.0). Electronically Signed   By: Lynwood Landy Raddle M.D.   On: 09/08/2024 14:13   DG ABD ACUTE 2+V W 1V CHEST Result Date: 09/08/2024 EXAM: UPRIGHT AND SUPINE XRAY VIEWS OF THE ABDOMEN AND 4 VIEW(S) OF THE CHEST 09/08/2024 01:19:00 PM COMPARISON: 08/02/2024. CT of the abdomen and pelvis 08/06/2024. CLINICAL HISTORY: Abdominal pain; right upper quadrant pain that was radiating to right side of chest and neck. Mimics pain when patient had perforated ulcer in August. Pain started this am without relief. Intermittent in nature. Patient had surgery for ulcer in August, current wound on abdomen. Wound care performed BID, changed this am. Patient currently in rehab. FINDINGS: LUNGS AND PLEURA: Mild pulmonary vascular congestion is present. Lung volumes are low. No consolidation or pulmonary edema. No pleural effusion or pneumothorax. HEART AND MEDIASTINUM: Heart size is upper limits of normal. Atherosclerotic calcifications are again noted at the aortic arch. No acute abnormality of the  cardiac and mediastinal silhouettes. BOWEL: The bowel gas pattern is nonspecific. No bowel obstruction. Previously noted pneumoperitoneum has resolved. PERITONEUM AND SOFT TISSUES: Soft tissue calcifications are generated at the level of the pelvis bilaterally. No abnormal calcifications. No free air. BONES: Lower lumbar fusion is noted. Degenerative changes are present about shoulders. No acute osseous abnormality. IMPRESSION: 1. No bowel obstruction or free air. Previously noted pneumoperitoneum has resolved. 2. Mild pulmonary vascular congestion. No consolidation, pulmonary edema, pleural effusion, or pneumothorax. 3. Aortic atherosclerosis. Electronically signed by: Lonni Necessary MD 09/08/2024 01:26 PM EDT RP Workstation: [id number]     Discharge Instructions: Discharge Instructions     Diet - low sodium heart healthy   Complete by: As directed    Discharge wound care:   Complete by: As directed    Cleanse abdominal wound with NS, apply saline moistened gauze to wound bed 2 times daily and secure with dry gauze and ABD pad and clothe tape   Increase activity slowly   Complete by: As directed        SignedBETHA Edgardo Pontiff, DO 09/09/2024, 11:51 AM

## 2024-09-09 NOTE — ED Notes (Signed)
 Unsuccessful attempt at calling [facility] for report. Voicemail previously left. No answer again.

## 2024-09-09 NOTE — Progress Notes (Signed)
 OT Cancellation Note  Patient Details Name: [name] MRN: [id number] DOB: 07-24-1961   Cancelled Treatment:    Reason Eval/Treat Not Completed: Medical issues which prohibited therapy (new PE, heparin  initiated 9/10 at 1500)  Betina Puckett D Causey 09/09/2024, 8:05 AM

## 2024-09-09 NOTE — Progress Notes (Signed)
 PHARMACY - ANTICOAGULATION CONSULT NOTE  Pharmacy Consult for conversion from continuous infusion of unfractionated heparin  to a DOAC (apixaban ). Indication: pulmonary embolus  Allergies  Allergen Reactions   Morphine  Other (See Comments)    Patient endorses heightened sensitivity    Patient Measurements: Height: 5' 3 (160 cm) Weight: 108.9 kg (240 lb) IBW/kg (Calculated) : 52.4 HEPARIN  DW (KG): 78.5  Vital Signs: Temp: 98.6 F (37 C) (09/11 0800) Temp Source: Oral (09/11 0800) BP: 133/95 (09/11 0800) Pulse Rate: 95 (09/11 0800)  Labs: Recent Labs    09/08/24 1332 09/08/24 1334 09/08/24 2317 09/09/24 0506  HGB 10.2* 9.8*  --  9.2*  HCT 30.0* 31.0*  --  28.4*  PLT  --  396  --  374  HEPARINUNFRC  --   --  0.33 0.42  CREATININE 0.70 0.75  --  0.71    Estimated Creatinine Clearance: 83 mL/min (by C-G formula based on SCr of 0.71 mg/dL).   Medical History: Past Medical History:  Diagnosis Date   Arthritis    Back pain    Chronic knee pain    Edema of both lower extremities    High blood pressure    High cholesterol    Joint pain    Multiple sclerosis (HCC)    Neuromuscular disorder (HCC)    Multiple Sclerosis over 20 years   Obesity    Vitamin D  deficiency     Medications:  Scheduled:   apixaban   10 mg Oral BID   Followed by   NOREEN ON 09/16/2024] apixaban   5 mg Oral BID   docusate sodium   100 mg Oral BID   hydrochlorothiazide   25 mg Oral q AM   lidocaine   1 patch Transdermal Q24H   losartan   100 mg Oral q AM   methocarbamol   500 mg Oral TID   pantoprazole   40 mg Oral BID   rosuvastatin   10 mg Oral QHS   Teriflunomide   14 mg Oral QHS     Plan:  Apixaban  treatment dosing for PE, apixaban  10 mg PO BID x 7 days followed thereafter by 5 mg PO BID, minimally for 3 months. Determination of LOT dependent upon findings of provocation vs. Non-provoked VTE. Patient education to follow.   Lynwood KATHEE Lites, PharmD, CPP Clinical Pharmacist  Practitioner 09/09/2024,8:42 AM

## 2024-09-09 NOTE — Progress Notes (Signed)
 OT Cancellation Note  Patient Details Name: [name] MRN: [id number] DOB: January 17, 1961   Cancelled Treatment:    Reason Eval/Treat Not Completed: Patient at procedure or test/ unavailable (OTF on arrival, OT to evaluate when pt is available)  [name] 09/09/2024, 9:18 AM

## 2024-09-20 DIAGNOSIS — I10 Essential (primary) hypertension: Secondary | ICD-10-CM | POA: Diagnosis not present

## 2024-09-20 DIAGNOSIS — K219 Gastro-esophageal reflux disease without esophagitis: Secondary | ICD-10-CM | POA: Diagnosis not present

## 2024-09-20 DIAGNOSIS — E785 Hyperlipidemia, unspecified: Secondary | ICD-10-CM | POA: Diagnosis not present

## 2024-10-01 ENCOUNTER — Other Ambulatory Visit (HOSPITAL_COMMUNITY): Payer: Self-pay | Admitting: Student

## 2024-10-01 DIAGNOSIS — T148XXA Other injury of unspecified body region, initial encounter: Secondary | ICD-10-CM

## 2024-10-05 ENCOUNTER — Ambulatory Visit (HOSPITAL_COMMUNITY)

## 2024-10-14 ENCOUNTER — Ambulatory Visit (HOSPITAL_BASED_OUTPATIENT_CLINIC_OR_DEPARTMENT_OTHER)

## 2024-10-14 ENCOUNTER — Encounter (HOSPITAL_BASED_OUTPATIENT_CLINIC_OR_DEPARTMENT_OTHER): Payer: Self-pay

## 2024-10-22 DIAGNOSIS — K219 Gastro-esophageal reflux disease without esophagitis: Secondary | ICD-10-CM | POA: Diagnosis not present

## 2024-10-22 DIAGNOSIS — E785 Hyperlipidemia, unspecified: Secondary | ICD-10-CM | POA: Diagnosis not present

## 2024-10-22 DIAGNOSIS — K251 Acute gastric ulcer with perforation: Secondary | ICD-10-CM | POA: Diagnosis not present

## 2024-10-22 DIAGNOSIS — I82402 Acute embolism and thrombosis of unspecified deep veins of left lower extremity: Secondary | ICD-10-CM

## 2024-10-22 DIAGNOSIS — I10 Essential (primary) hypertension: Secondary | ICD-10-CM | POA: Diagnosis not present

## 2024-11-19 NOTE — Progress Notes (Signed)
 Impression    Welcome to Medicare preventive visit -     Welcome to Medicare EKG - Reviewed the information and relative risk factors were discussed below and with patient. We did discuss AD information and updates with the patient. Welcome to Medicare EKG Result Date: 11/22/2024 Sinus Rhythm -Incomplete right bundle branch block and left axis -anterior fascicular block. -Old inferior-apical infarct no old EKG for comparison ABNORMAL  Acute pulmonary embolism without acute cor pulmonale, unspecified pulmonary embolism type (*) After patient had a perforated ulcer, she pulmonary embolism.  She has completed her treatment up with Eliquis , has been 3 months and doing very well.  Chronic ulcer skin, with fat layer exposed (*) Acute gastric ulcer with perforation (*) -     CBC And Differential; Future Postoperative seroma of musculoskeletal structure after musculoskeletal procedure She developed acute gastric ulcer for the patient, she then developed a seroma that required drainage, and ulceration that is healing.  She is doing well, no signs of infection at this point, she is avoiding NSAIDs that she should plan on continuing to monitor her overall improvement.  Going to get some updated labs today.  Morbid obesity due to excess calories (*) She has lost almost 40 pounds, 25 months or so right now she has really been doing that with dietary measures.  She has some limitations with her activity because of her multiple sclerosis and because of her joints and back issues but she has been feeling really well.  She still would be a candidate for GLP-1 agonist, but cost has been an issue at this time.  Immunodeficiency due to drugs (*) Multiple sclerosis Multiple sclerosis has remained stable, her neurologist has, but she is still at the same practice and maintaining on the same medications.  She mostly has issues with worsening debility and hands, but overall she feels like she is doing  well.  Essential hypertension -     Comprehensive Metabolic Panel; Future -     POCT urinalysis dipstick; Future Blood pressure still at goal, but okay wears normal because of stressors, not sleeping well last night as well and some nights related to just overall pain and discomfort.  We talked through that today, she had been on duloxetine in the past, she is going to restart that as she was unaware that that can also help with pain management, as it did help with her overall mood during that time.  She did not have any abnormal heart sounds or leg swelling on exam and she is getting her labs drawn today.  Maintain on the current medical regiment.  Hypercholesteremia -     Lipid Panel; Future Will see what her lipids look like, she is on simvastatin, no muscle aches or pains and continues to work on dietary measures.  Vitamin D  insufficiency -     Vitamin D  25 Hydroxy; Future Will monitor vitamin D  levels, she is maintaining on the vitamin D  for now as well.  Primary osteoarthritis of both knees Chronic arthritis of the knees, as stated, she is trying to get ready for surgery so that she can try to get some relief and become more ambulatory.  Avoiding NSAIDs, she is using Tylenol  for now, and hopefully the duloxetine can be of some benefit.  Migraine without aura and without status migrainosus, not intractable Migraines have remained stable, but as mentioned the duloxetine may help.  Lumbar radiculopathy She does have lumbar radiculopathy, she has had fusion of the back, chronic back pain  associated with that and her knees have not been helpful for her back.  She is in process that is ready for an ablation and see if that helps.  Adjustment reaction with anxiety and depression -     DULoxetine HCl (CYMBALTA) 30 mg capsule; Take one capsule (30 mg dose) by mouth daily. As mentioned, the duloxetine has been started, she is to notify us  in about 3 to 4 weeks as to how she is doing as we could  consider titration and dose at that point.  Flu vaccine need -     Fluad Trivalent Adjuvanted (age 75 and older/solid organ transplant recipients age 62-64) She is willing to get a flu vaccine today, she is aware to associated with it, she agreed, she received it, no adverse reaction noted.  Transportation insecurity She does have some transportation insecurities, although she feels like she is on the path of improvement.  She is getting ready to have a ramp put up at her home, as well as railings in the back from to help with support and transportation and improvements.   Plan  Labs to be performed approximately 1 week prior to OV.  Follow up in about 6 months (around 05/22/2025) for htn, lipid, pain and, fast labs 3-7 days prior to visit, 40 mins.   Health maintenance issues including appropriate cancer screening, healthy diet, exercise and tobacco avoidance were discussed with the patient.  Risks, benefits, and alternatives of the medications and treatment plan prescribed today were discussed, and patient expressed understanding. Plan follow-up as discussed or as needed if any worsening symptoms or change in condition.   This note was dictated with voice recognition software. Similar sounding words can inadvertently be transcribed and may not be corrected upon review.  Objective   BP 130/72 (BP Location: Left Upper Arm, Patient Position: Sitting)   Pulse 74   Temp 97.5 F (36.4 C) (Temporal)   Resp 18   Ht 5' 3 (1.6 m)   Wt 231 lb 6.4 oz (105 kg) Comment: per pt  LMP 01/18/2013   SpO2 95%   BMI 40.99 kg/m   General:  Well developed, well nourished, no distress, obese, in a wheelchair Head:  Normocephalic, atraumatic Eyes:  Pupils equal, round, reactive to light and accommodation, conjunctiva normal Ears:   Bilateral external auditory canals normal, right tympanic membrane normal, left tympanic membrane normal Nose:  Nares normal and no sinus pressure or tenderness Throat:   Oropharynx moist and clear and no exudates present. Teeth in good repair Neck:  Supple with normal range of motion, no lymphadenopathy, no thyroidmegaly  Cardiovascular:  S1S2, regular, rate, and rhythm, no murmurs , no gallops, no rubs, no lifts, no JVD, no Carotid bruits Lungs:  Clear to auscultation with normal effort, no R/R/W and no dullness to percussion Abdomen:  Bowel sounds active, soft, non-tender, non-distended, no hepatosplenomegaly or masses but healing midline abd wound, no surrounding redness or oozing Skin:  No focal rashes  Extremities:  No clubbing, no cyanosis, no edema, Dorsalis Pedis and Posterior Tibial pulses 1+ Neurologic:  No focal deficits, Motor 5/5 Bilateral and equal, Sensations are intact, DTRs 2/4 Bilateral and equal, Cranial Nerves II-XII intact Back:  No CVA/Spinal Tenderness, - Straight leg raise bilaterally, no muscle atrophy and muscle tone unremarkable.  Subjective   Patient ID:  [name] is a 63 y.o. (DOB 1961/10/12) female.     Patient presents with   Medicare Wellness Visit    Pt presented for  her AWV-SUB. Her HRA and AD info were reviewed and updated with her today.     Hyperlipidemia    Pt is taking current regimen   Hypertension    Pt denies CP, SHOB, BLE edema, palpitations or severe headaches. She did not sleep well and rushing.   Vitamin Deficiency    Vit D    perforated ulcer    From chronic NSAID use, for chronic knee pain. She was taking it and trying to work on reducing pain and getting ready for surgery as well, but developed a perforation.   Back Pain    She is s/p back fusion, but no major improvement and they are planning on ablation.   Multiple Sclerosis    Stable but some limitations with her hands. Dr. Alm Poisson retired.       No visits with results within 1 Month(s) from this visit.  Latest known visit with results is:  Ancillary Procedure on 06/28/2024  Component Date Value Ref Range Status    Patient Height 06/28/2024 63   Final   Patient Weight 06/28/2024 4,448   Final   Blood Pressure Monitoring 06/28/2024 110/72   Final   Pulse 06/28/2024 73   Final   Calculated BMI 06/28/2024 49.3   Final   BSA 06/28/2024 2.37  m2 Final   Pulse Ox 06/28/2024 95   Final    Review of Systems is complete and negative except as noted above.   Reviewed and updated this visit by provider: Tobacco  Allergies  Meds  Problems  Med Hx  OB Hx  SE Hx  Surg Hx  Fam Hx           Medicare AWV  Aaradhya Dionna Wiedemann is a 63 y.o. female who presents for her Welcome to Medicare visit.  Clinical documentation was reviewed and is accessible via encounter-level attachments.    Any physical exam components or additional concerns beyond the scope of the Annual Wellness Visit may be documented in a separate note within this encounter.  Medicare Required Components     Reviewed and updated this visit by provider: Tobacco  Allergies  Meds  Problems  Med Hx  OB Hx  SE Hx  Surg Hx  Fam Hx       Medicare required assessment: Future risk of substance use disorder / overdose risk of 76% is higher (>=20%).    Patient Care Team: Norleen Dover Card, MD as PCP - General (Internal Medicine) Alm JONETTA Poisson, MD as Consulting Physician (Neurology)  Vitals   Vitals:   11/22/24 1059  BP: 130/72  Patient Position: Sitting  Pulse: 74  Temp: 97.5 F (36.4 C)  TempSrc: Temporal  Resp: 18  Height: 5' 3 (1.6 m)  Weight: 231 lb 6.4 oz (105 kg) Comment: per pt  SpO2: 95%  BMI (Calculated): 41  PainSc:   6  PainLoc: Knee    Disposition   1. Welcome to Medicare preventive visit (Primary) -     Welcome to Medicare EKG 2. Acute pulmonary embolism without acute cor pulmonale, unspecified pulmonary embolism type (*) 3. Chronic ulcer skin, with fat layer exposed (*) 4. Acute gastric ulcer with perforation (*) -     CBC And Differential; Future; Expected date: 06/19/2025 5.  Postoperative seroma of musculoskeletal structure after musculoskeletal procedure 6. Morbid obesity due to excess calories (*) 7. Immunodeficiency due to drugs (*) 8. Multiple sclerosis 9. Essential hypertension -     Comprehensive Metabolic Panel; Future; Expected date: 06/19/2025 -  POCT urinalysis dipstick; Future; Expected date: 06/19/2025 10. Hypercholesteremia -     Lipid Panel; Future; Expected date: 06/19/2025 11. Vitamin D  insufficiency -     Vitamin D  25 Hydroxy; Future; Expected date: 06/19/2025 12. Primary osteoarthritis of both knees 13. Migraine without aura and without status migrainosus, not intractable 14. Lumbar radiculopathy 15. Adjustment reaction with anxiety and depression -     DULoxetine HCl (CYMBALTA) 30 mg capsule; Take one capsule (30 mg dose) by mouth daily., Starting Mon 11/22/2024, Normal 16. Flu vaccine need -     Fluad Trivalent Adjuvanted (age 49 and older/solid organ transplant recipients age 30-64) 51. Transportation insecurity   Follow up in about 6 months (around 05/22/2025) for htn, lipid, pain and, fast labs 3-7 days prior to visit, 40 mins.   Health maintenance issues were discussed with the patient.  A written plan was provided to the patient in the form of patient instructions in the After Visit Summary document.     SDoH Screening Completed SDoH screening was reviewed as documented with appropriate diagnoses added based on screening.  Time spent performing SDoH screening, documenting as appropriate, and entering intervention when necessary: 5 minutes or more

## 2025-01-13 ENCOUNTER — Encounter: Payer: Self-pay | Admitting: Gastroenterology
